# Patient Record
Sex: Female | Born: 1945 | Race: White | Hispanic: No | Marital: Married | Smoking: Never smoker
Health system: Southern US, Community
[De-identification: ages and names within clinical notes are randomized; demographics above are authoritative.]

## PROBLEM LIST (undated history)

## (undated) DIAGNOSIS — K5790 Diverticulosis of intestine, part unspecified, without perforation or abscess without bleeding: Secondary | ICD-10-CM

## (undated) DIAGNOSIS — G629 Polyneuropathy, unspecified: Secondary | ICD-10-CM

## (undated) DIAGNOSIS — I639 Cerebral infarction, unspecified: Secondary | ICD-10-CM

## (undated) DIAGNOSIS — O24419 Gestational diabetes mellitus in pregnancy, unspecified control: Secondary | ICD-10-CM

## (undated) DIAGNOSIS — J189 Pneumonia, unspecified organism: Secondary | ICD-10-CM

## (undated) DIAGNOSIS — N952 Postmenopausal atrophic vaginitis: Secondary | ICD-10-CM

## (undated) DIAGNOSIS — R51 Headache: Secondary | ICD-10-CM

## (undated) DIAGNOSIS — F329 Major depressive disorder, single episode, unspecified: Secondary | ICD-10-CM

## (undated) DIAGNOSIS — F32A Depression, unspecified: Secondary | ICD-10-CM

## (undated) DIAGNOSIS — M810 Age-related osteoporosis without current pathological fracture: Secondary | ICD-10-CM

## (undated) DIAGNOSIS — R42 Dizziness and giddiness: Secondary | ICD-10-CM

## (undated) DIAGNOSIS — Z8719 Personal history of other diseases of the digestive system: Secondary | ICD-10-CM

## (undated) DIAGNOSIS — I6529 Occlusion and stenosis of unspecified carotid artery: Secondary | ICD-10-CM

## (undated) DIAGNOSIS — Z8619 Personal history of other infectious and parasitic diseases: Secondary | ICD-10-CM

## (undated) DIAGNOSIS — R112 Nausea with vomiting, unspecified: Secondary | ICD-10-CM

## (undated) DIAGNOSIS — M2550 Pain in unspecified joint: Secondary | ICD-10-CM

## (undated) DIAGNOSIS — D649 Anemia, unspecified: Secondary | ICD-10-CM

## (undated) DIAGNOSIS — I499 Cardiac arrhythmia, unspecified: Secondary | ICD-10-CM

## (undated) DIAGNOSIS — Z9889 Other specified postprocedural states: Secondary | ICD-10-CM

## (undated) DIAGNOSIS — M1990 Unspecified osteoarthritis, unspecified site: Secondary | ICD-10-CM

## (undated) DIAGNOSIS — G2581 Restless legs syndrome: Secondary | ICD-10-CM

## (undated) DIAGNOSIS — M549 Dorsalgia, unspecified: Secondary | ICD-10-CM

## (undated) DIAGNOSIS — K909 Intestinal malabsorption, unspecified: Principal | ICD-10-CM

## (undated) DIAGNOSIS — R252 Cramp and spasm: Secondary | ICD-10-CM

## (undated) DIAGNOSIS — Z8709 Personal history of other diseases of the respiratory system: Secondary | ICD-10-CM

## (undated) DIAGNOSIS — E559 Vitamin D deficiency, unspecified: Secondary | ICD-10-CM

## (undated) DIAGNOSIS — J45909 Unspecified asthma, uncomplicated: Secondary | ICD-10-CM

## (undated) DIAGNOSIS — E785 Hyperlipidemia, unspecified: Secondary | ICD-10-CM

## (undated) DIAGNOSIS — K219 Gastro-esophageal reflux disease without esophagitis: Secondary | ICD-10-CM

## (undated) DIAGNOSIS — G4700 Insomnia, unspecified: Secondary | ICD-10-CM

## (undated) DIAGNOSIS — Z8379 Family history of other diseases of the digestive system: Secondary | ICD-10-CM

## (undated) DIAGNOSIS — M2540 Effusion, unspecified joint: Secondary | ICD-10-CM

## (undated) DIAGNOSIS — R351 Nocturia: Secondary | ICD-10-CM

## (undated) DIAGNOSIS — D126 Benign neoplasm of colon, unspecified: Secondary | ICD-10-CM

## (undated) DIAGNOSIS — G4730 Sleep apnea, unspecified: Secondary | ICD-10-CM

## (undated) HISTORY — PX: JOINT REPLACEMENT: SHX530

## (undated) HISTORY — PX: WISDOM TOOTH EXTRACTION: SHX21

## (undated) HISTORY — DX: Hyperlipidemia, unspecified: E78.5

## (undated) HISTORY — DX: Benign neoplasm of colon, unspecified: D12.6

## (undated) HISTORY — DX: Occlusion and stenosis of unspecified carotid artery: I65.29

## (undated) HISTORY — DX: Dizziness and giddiness: R42

## (undated) HISTORY — DX: Restless legs syndrome: G25.81

## (undated) HISTORY — DX: Gestational diabetes mellitus in pregnancy, unspecified control: O24.419

## (undated) HISTORY — DX: Age-related osteoporosis without current pathological fracture: M81.0

## (undated) HISTORY — PX: HERNIA REPAIR: SHX51

## (undated) HISTORY — PX: STEROID INJECTION TO SCAR: SHX2447

## (undated) HISTORY — DX: Gastro-esophageal reflux disease without esophagitis: K21.9

## (undated) HISTORY — PX: COLONOSCOPY: SHX174

## (undated) HISTORY — DX: Vitamin D deficiency, unspecified: E55.9

## (undated) HISTORY — PX: ESOPHAGOGASTRODUODENOSCOPY: SHX1529

## (undated) HISTORY — DX: Cramp and spasm: R25.2

## (undated) HISTORY — DX: Unspecified osteoarthritis, unspecified site: M19.90

## (undated) HISTORY — DX: Postmenopausal atrophic vaginitis: N95.2

## (undated) HISTORY — DX: Intestinal malabsorption, unspecified: K90.9

## (undated) HISTORY — DX: Diverticulosis of intestine, part unspecified, without perforation or abscess without bleeding: K57.90

## (undated) HISTORY — PX: OTHER SURGICAL HISTORY: SHX169

---

## 1998-11-15 ENCOUNTER — Other Ambulatory Visit: Admission: RE | Admit: 1998-11-15 | Discharge: 1998-11-15 | Payer: Self-pay | Admitting: Obstetrics and Gynecology

## 1998-11-22 ENCOUNTER — Other Ambulatory Visit: Admission: RE | Admit: 1998-11-22 | Discharge: 1998-11-22 | Payer: Self-pay | Admitting: Obstetrics and Gynecology

## 1999-07-04 ENCOUNTER — Other Ambulatory Visit: Admission: RE | Admit: 1999-07-04 | Discharge: 1999-07-04 | Payer: Self-pay | Admitting: Obstetrics and Gynecology

## 1999-11-01 ENCOUNTER — Encounter: Payer: Self-pay | Admitting: Obstetrics and Gynecology

## 1999-11-01 ENCOUNTER — Encounter: Admission: RE | Admit: 1999-11-01 | Discharge: 1999-11-01 | Payer: Self-pay | Admitting: Obstetrics and Gynecology

## 1999-12-16 HISTORY — PX: GASTRIC BYPASS: SHX52

## 1999-12-19 ENCOUNTER — Other Ambulatory Visit: Admission: RE | Admit: 1999-12-19 | Discharge: 1999-12-19 | Payer: Self-pay | Admitting: Obstetrics and Gynecology

## 2000-01-31 ENCOUNTER — Ambulatory Visit (HOSPITAL_COMMUNITY): Admission: RE | Admit: 2000-01-31 | Discharge: 2000-01-31 | Payer: Self-pay | Admitting: Gastroenterology

## 2000-08-07 ENCOUNTER — Encounter (INDEPENDENT_AMBULATORY_CARE_PROVIDER_SITE_OTHER): Payer: Self-pay

## 2000-08-07 ENCOUNTER — Other Ambulatory Visit: Admission: RE | Admit: 2000-08-07 | Discharge: 2000-08-07 | Payer: Self-pay | Admitting: Obstetrics and Gynecology

## 2000-11-19 ENCOUNTER — Encounter: Admission: RE | Admit: 2000-11-19 | Discharge: 2000-11-19 | Payer: Self-pay | Admitting: Internal Medicine

## 2000-11-19 ENCOUNTER — Encounter: Payer: Self-pay | Admitting: Internal Medicine

## 2001-02-02 ENCOUNTER — Encounter: Payer: Self-pay | Admitting: Emergency Medicine

## 2001-02-02 ENCOUNTER — Emergency Department (HOSPITAL_COMMUNITY): Admission: EM | Admit: 2001-02-02 | Discharge: 2001-02-02 | Payer: Self-pay | Admitting: Emergency Medicine

## 2001-12-09 ENCOUNTER — Encounter: Payer: Self-pay | Admitting: Obstetrics and Gynecology

## 2001-12-09 ENCOUNTER — Encounter: Admission: RE | Admit: 2001-12-09 | Discharge: 2001-12-09 | Payer: Self-pay | Admitting: Obstetrics and Gynecology

## 2001-12-17 ENCOUNTER — Other Ambulatory Visit: Admission: RE | Admit: 2001-12-17 | Discharge: 2001-12-17 | Payer: Self-pay | Admitting: Obstetrics and Gynecology

## 2002-01-26 ENCOUNTER — Encounter: Admission: RE | Admit: 2002-01-26 | Discharge: 2002-01-26 | Payer: Self-pay | Admitting: Obstetrics and Gynecology

## 2002-01-26 ENCOUNTER — Encounter: Payer: Self-pay | Admitting: Obstetrics and Gynecology

## 2002-01-27 ENCOUNTER — Encounter: Payer: Self-pay | Admitting: Orthopedic Surgery

## 2002-01-27 ENCOUNTER — Encounter: Admission: RE | Admit: 2002-01-27 | Discharge: 2002-01-27 | Payer: Self-pay | Admitting: Orthopedic Surgery

## 2002-05-18 ENCOUNTER — Encounter: Payer: Self-pay | Admitting: Orthopedic Surgery

## 2002-05-20 ENCOUNTER — Observation Stay (HOSPITAL_COMMUNITY): Admission: RE | Admit: 2002-05-20 | Discharge: 2002-05-21 | Payer: Self-pay | Admitting: Orthopedic Surgery

## 2002-07-14 ENCOUNTER — Encounter: Payer: Self-pay | Admitting: Surgery

## 2002-07-14 ENCOUNTER — Observation Stay (HOSPITAL_COMMUNITY): Admission: EM | Admit: 2002-07-14 | Discharge: 2002-07-16 | Payer: Self-pay | Admitting: Emergency Medicine

## 2002-08-11 ENCOUNTER — Ambulatory Visit (HOSPITAL_COMMUNITY): Admission: RE | Admit: 2002-08-11 | Discharge: 2002-08-12 | Payer: Self-pay | Admitting: Orthopaedic Surgery

## 2002-09-20 ENCOUNTER — Encounter: Admission: RE | Admit: 2002-09-20 | Discharge: 2002-09-20 | Payer: Self-pay | Admitting: Orthopaedic Surgery

## 2002-09-20 ENCOUNTER — Encounter: Payer: Self-pay | Admitting: Orthopaedic Surgery

## 2002-12-13 ENCOUNTER — Ambulatory Visit (HOSPITAL_COMMUNITY): Admission: RE | Admit: 2002-12-13 | Discharge: 2002-12-13 | Payer: Self-pay | Admitting: *Deleted

## 2003-02-08 ENCOUNTER — Other Ambulatory Visit: Admission: RE | Admit: 2003-02-08 | Discharge: 2003-02-08 | Payer: Self-pay | Admitting: Obstetrics and Gynecology

## 2003-02-13 ENCOUNTER — Encounter: Payer: Self-pay | Admitting: Obstetrics and Gynecology

## 2003-02-13 ENCOUNTER — Encounter: Admission: RE | Admit: 2003-02-13 | Discharge: 2003-02-13 | Payer: Self-pay | Admitting: Obstetrics and Gynecology

## 2004-02-08 ENCOUNTER — Encounter: Admission: RE | Admit: 2004-02-08 | Discharge: 2004-02-08 | Payer: Self-pay | Admitting: Obstetrics and Gynecology

## 2004-02-15 ENCOUNTER — Other Ambulatory Visit: Admission: RE | Admit: 2004-02-15 | Discharge: 2004-02-15 | Payer: Self-pay | Admitting: Obstetrics and Gynecology

## 2004-02-22 ENCOUNTER — Encounter: Admission: RE | Admit: 2004-02-22 | Discharge: 2004-02-22 | Payer: Self-pay | Admitting: Obstetrics and Gynecology

## 2004-10-24 ENCOUNTER — Encounter: Admission: RE | Admit: 2004-10-24 | Discharge: 2004-10-24 | Payer: Self-pay | Admitting: Obstetrics and Gynecology

## 2004-10-31 ENCOUNTER — Encounter: Admission: RE | Admit: 2004-10-31 | Discharge: 2004-10-31 | Payer: Self-pay | Admitting: General Surgery

## 2004-10-31 IMAGING — CT CT CHEST W/ CM
1 series · 16 of 33 positions shown, 20 images · IV contrast (75ML OMNI 300)
Comparison: none

CLINICAL DATA: Left chest wall mass palpable on exam.
 CHEST CT WITH CONTRAST ? [DATE]:

[Series 2: chest w/ · axial · 0.70mm/px · z∈[-331,-11]mm · 16 of 70 slices shown, 20 images]
[im 3/70  mediastinal]
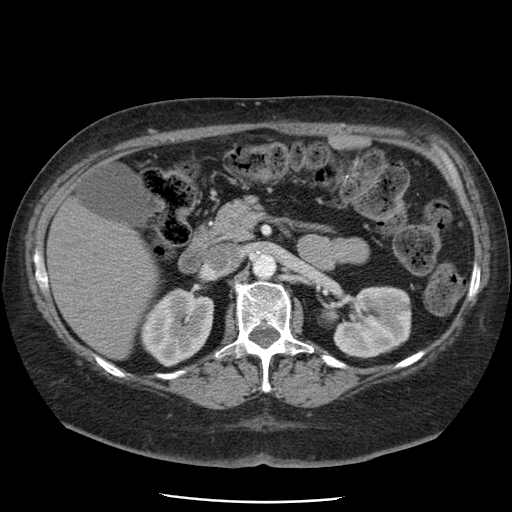
[im 3/70  lung]
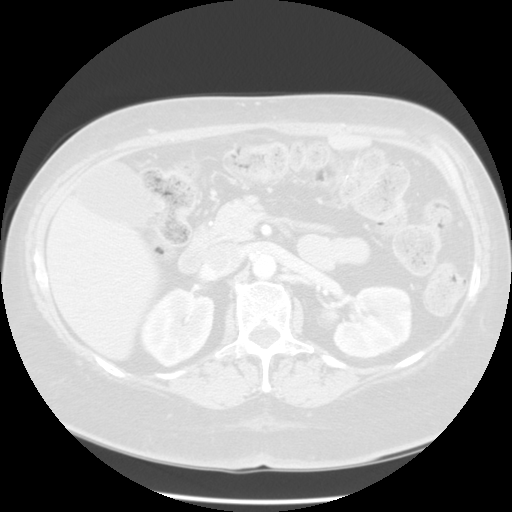
[im 8/70  lung]
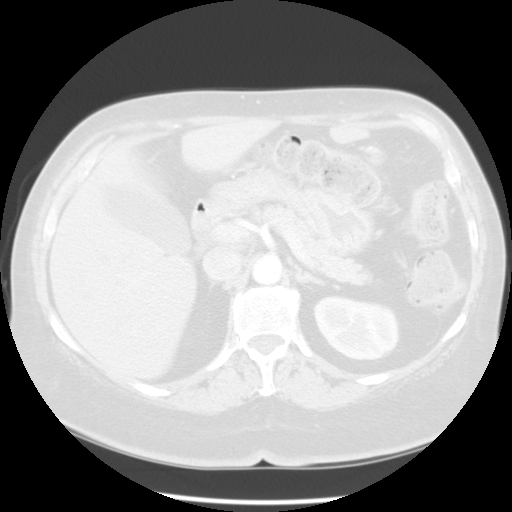
[im 13/70  lung]
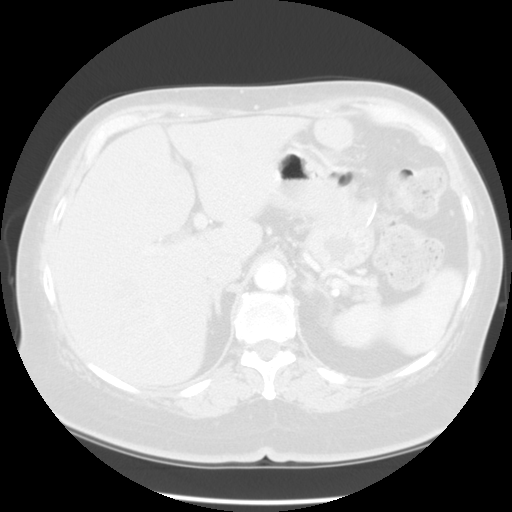
[im 16/70  lung]
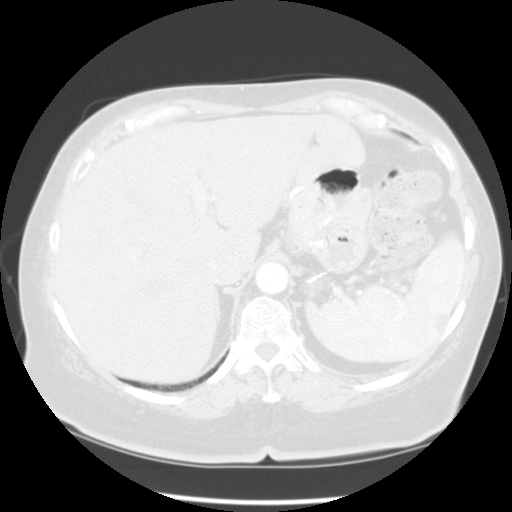
[im 21/70  mediastinal]
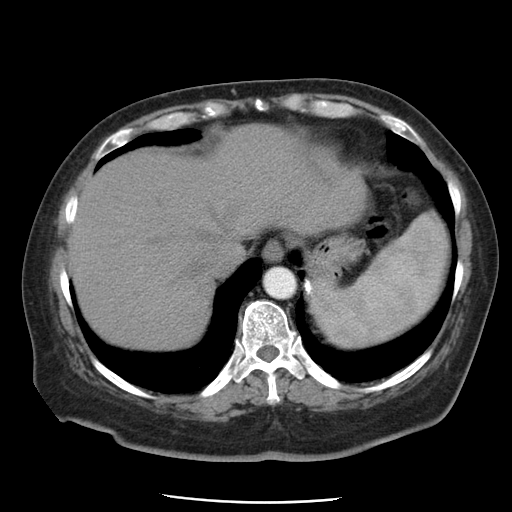
[im 21/70  lung]
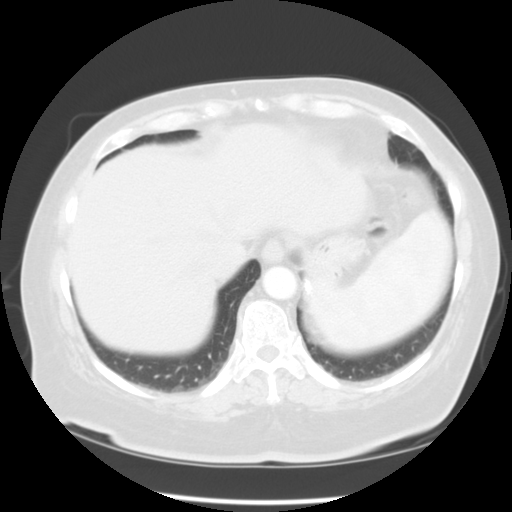
[im 26/70  lung]
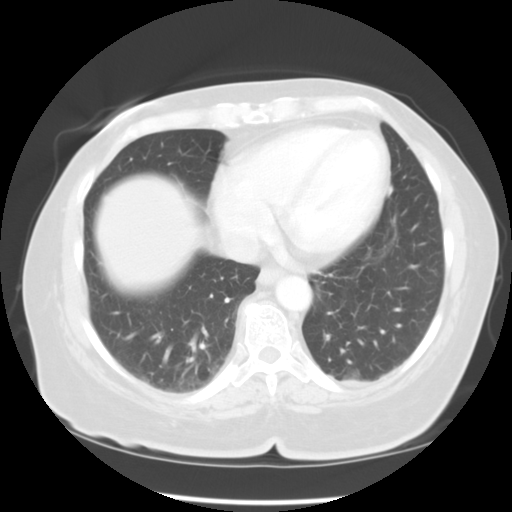
[im 29/70  lung]
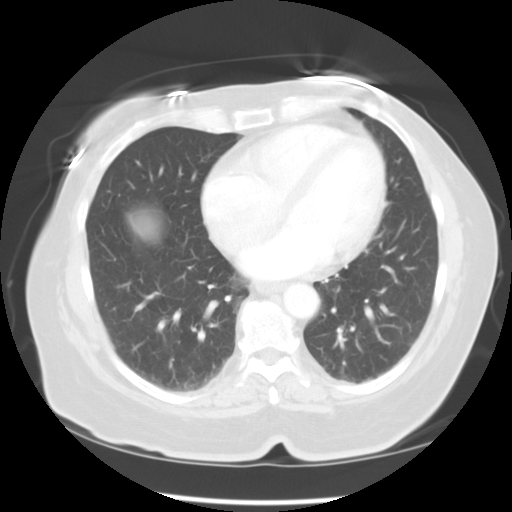
[im 34/70  lung]
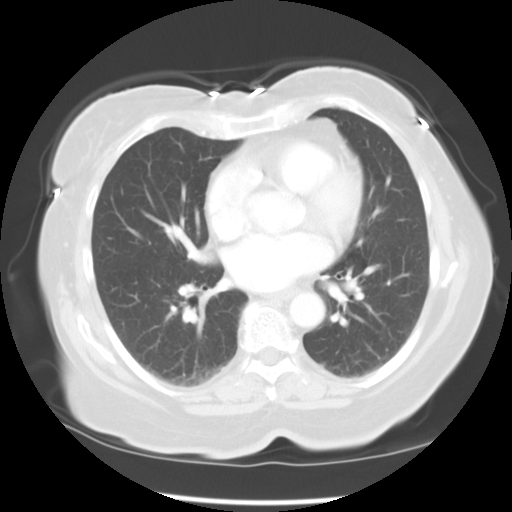
[im 37/70  mediastinal]
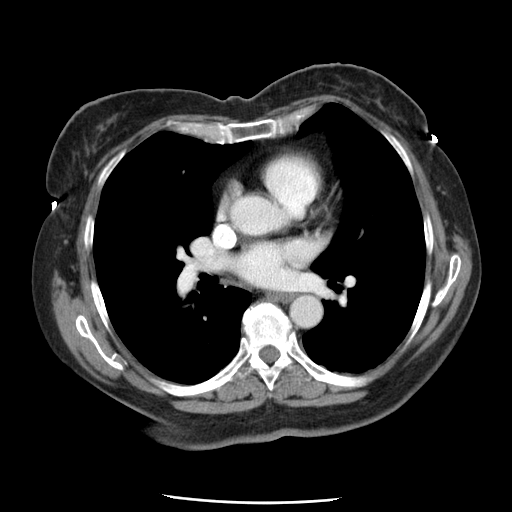
[im 37/70  lung]
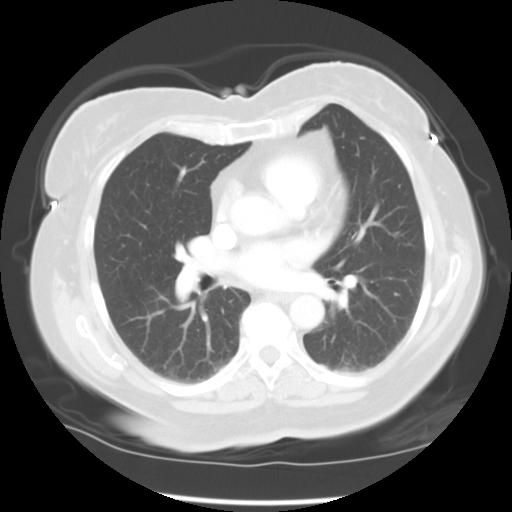
[im 41/70  lung]
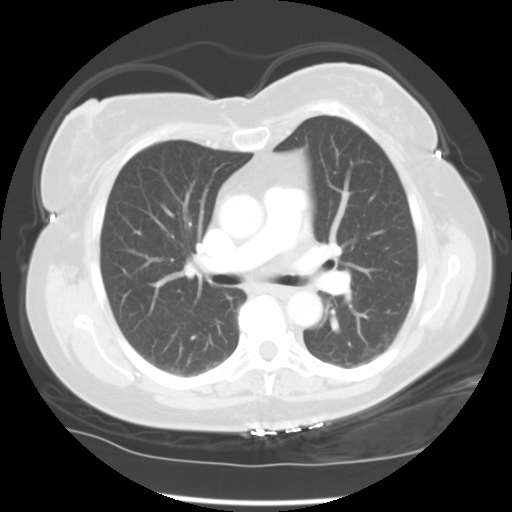
[im 44/70  lung]
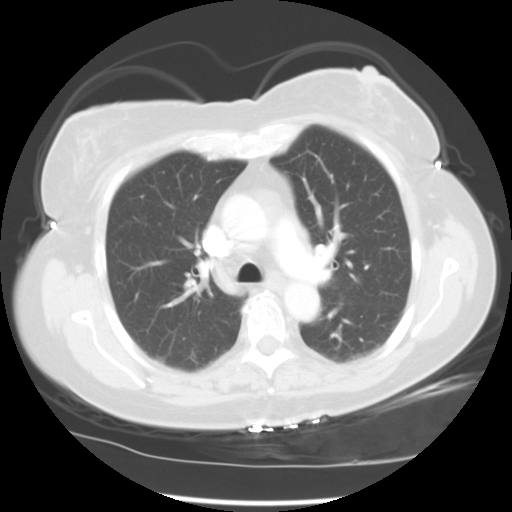
[im 49/70  lung]
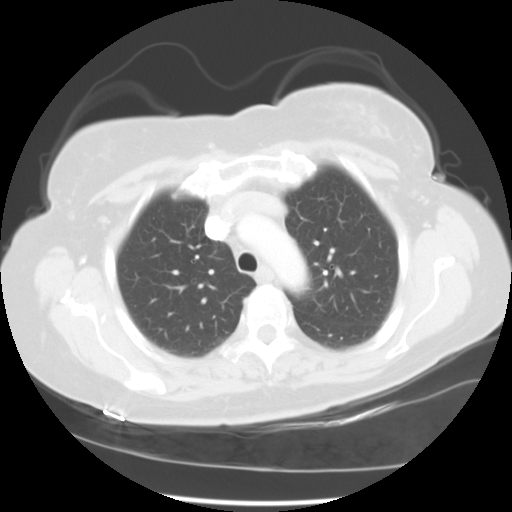
[im 54/70  mediastinal]
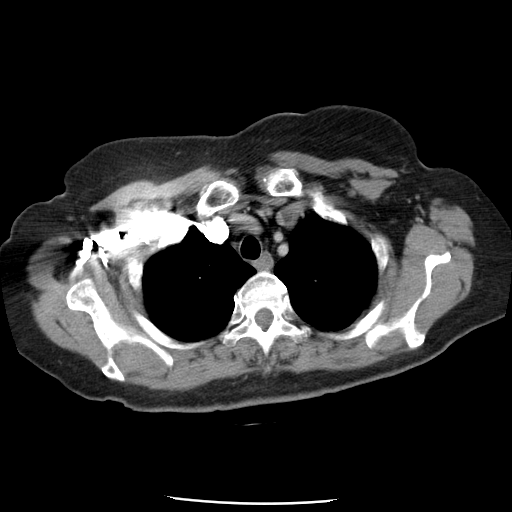
[im 54/70  lung]
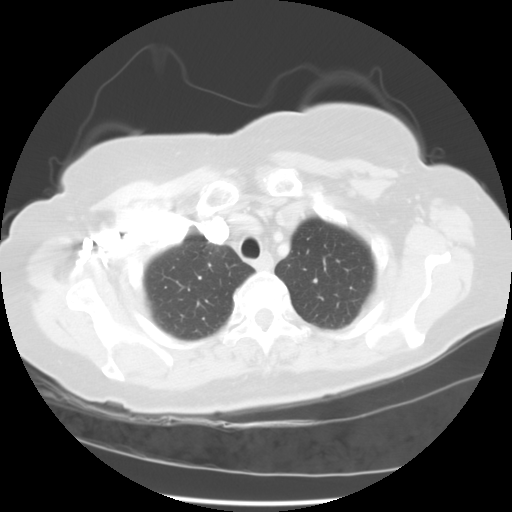
[im 57/70  lung]
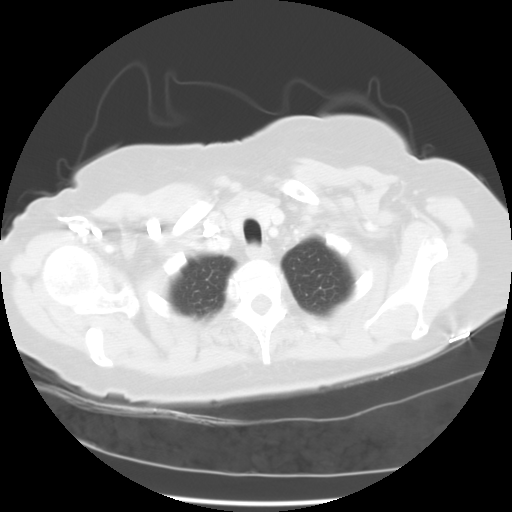
[im 62/70  lung]
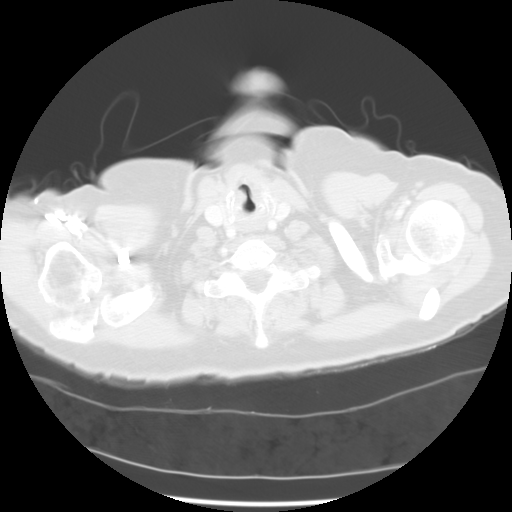
[im 67/70  lung]
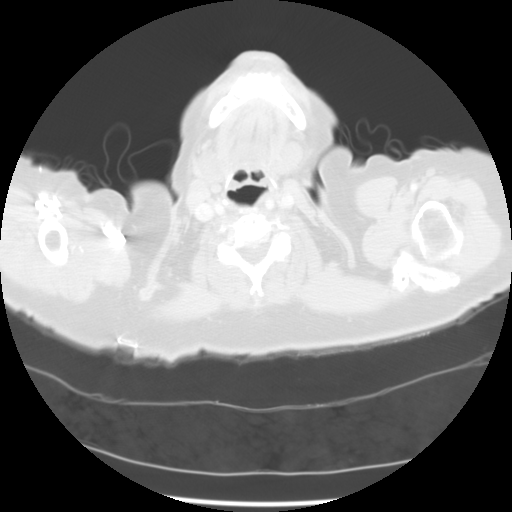

[16 of 33 positions shown; findings below may reference images not displayed]

FINDINGS: Multidetector spiral axial images were obtained through the thorax with IV injection of 75 cc Omnipaque 300.  Comparison is made with digitized [REDACTED] PA chest x-ray of [DATE].
 No discrete CT abnormality is seen in the region of the left anterior chest wall superiorly between clavicle and superior breasts.  Lungs are clear.  Heart size is upper limits of normal.  No mediastinum, hilar, nor axillary mass/adenopathy is seen.  No significant osseous abnormality is noted with slight degenerative change in the dorsal spine.  1.9 cm right renal and 1.4 cm lateral splenic cysts are seen with gastric surgical clips noted.  Upper abdominal organs are otherwise unremarkable.
IMPRESSION: 1.  No CT abnormality in region questioned on anterior superior chest wall palpable mass.
 2.  Post gastric surgery with incidental splenic and right renal cysts.
 3.  Otherwise no significant abnormality.

## 2005-02-20 ENCOUNTER — Other Ambulatory Visit: Admission: RE | Admit: 2005-02-20 | Discharge: 2005-02-20 | Payer: Self-pay | Admitting: Obstetrics and Gynecology

## 2005-03-11 ENCOUNTER — Encounter: Admission: RE | Admit: 2005-03-11 | Discharge: 2005-03-11 | Payer: Self-pay | Admitting: Obstetrics and Gynecology

## 2005-09-15 ENCOUNTER — Encounter (HOSPITAL_COMMUNITY): Admission: RE | Admit: 2005-09-15 | Discharge: 2005-12-12 | Payer: Self-pay | Admitting: Neurology

## 2006-02-13 ENCOUNTER — Encounter (HOSPITAL_COMMUNITY): Admission: RE | Admit: 2006-02-13 | Discharge: 2006-03-26 | Payer: Self-pay | Admitting: Neurology

## 2006-03-02 ENCOUNTER — Other Ambulatory Visit: Admission: RE | Admit: 2006-03-02 | Discharge: 2006-03-02 | Payer: Self-pay | Admitting: Obstetrics and Gynecology

## 2006-07-23 ENCOUNTER — Encounter (HOSPITAL_COMMUNITY): Admission: RE | Admit: 2006-07-23 | Discharge: 2006-10-21 | Payer: Self-pay | Admitting: Neurology

## 2006-10-23 ENCOUNTER — Encounter (HOSPITAL_COMMUNITY): Admission: RE | Admit: 2006-10-23 | Discharge: 2006-12-10 | Payer: Self-pay | Admitting: Neurology

## 2007-04-09 ENCOUNTER — Encounter (HOSPITAL_COMMUNITY): Admission: RE | Admit: 2007-04-09 | Discharge: 2007-05-14 | Payer: Self-pay | Admitting: Neurology

## 2007-07-08 ENCOUNTER — Encounter (HOSPITAL_COMMUNITY): Admission: RE | Admit: 2007-07-08 | Discharge: 2007-09-13 | Payer: Self-pay | Admitting: Neurology

## 2007-07-16 ENCOUNTER — Encounter: Admission: RE | Admit: 2007-07-16 | Discharge: 2007-07-16 | Payer: Self-pay | Admitting: Surgical Oncology

## 2007-07-16 IMAGING — CT CT ABDOMEN W/ CM
2 of 8 series · 14 of 46 positions shown, 19 images · IV contrast (READICAT/WATER & [ID] OMNI 300)
Comparison: None.

CLINICAL DATA: Right periumbilical discomfort, evaluate for hernia.
ABDOMEN CT WITH CONTRAST:
TECHNIQUE: Multidetector CT imaging of the abdomen was performed following the standard protocol during bolus administration of intravenous contrast.
Contrast:  100 cc of Omnipaque 300.
TECHNIQUE: Multidetector CT imaging of the pelvis was performed following the standard protocol during bolus administration of intravenous contrast.
The mesh appears intact in the mid abdominal line.  Uterus is normal in size.  Urinary bladder is unremarkable.  No adnexal lesion is seen.  Scattered colonic diverticula are present.  There is anterolisthesis of L5 on S1 with degenerative disk disease noted.  There appear to be pars defects at L5 bilaterally.

[Series 2: a&p w/ · axial · 0.70mm/px · z∈[-411,-36]mm · 11 of 86 slices shown, 16 images]
[im 7/86  soft-tissue]
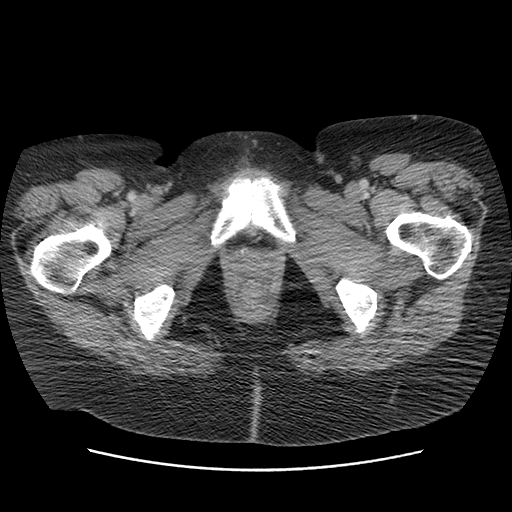
[im 7/86  bone]
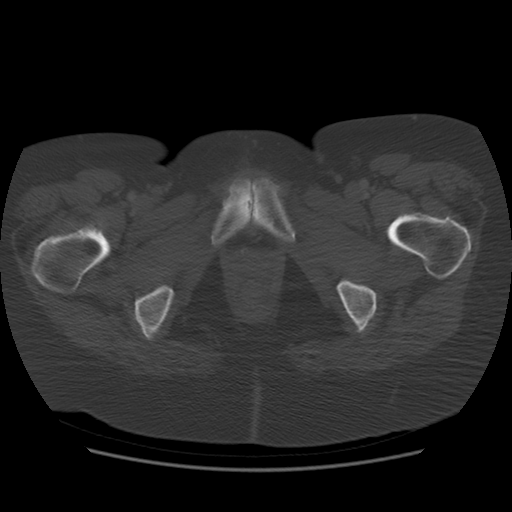
[im 14/86  soft-tissue]
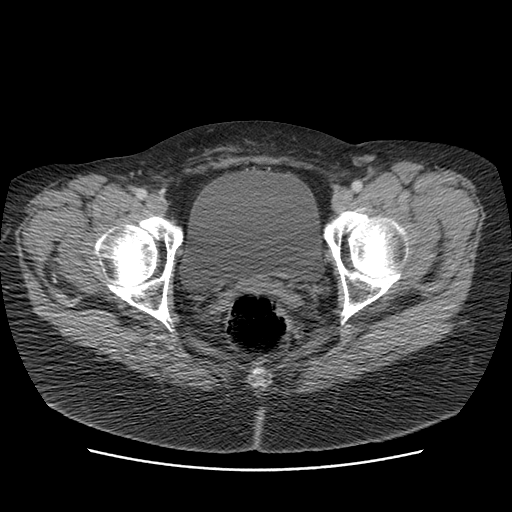
[im 27/86  soft-tissue]
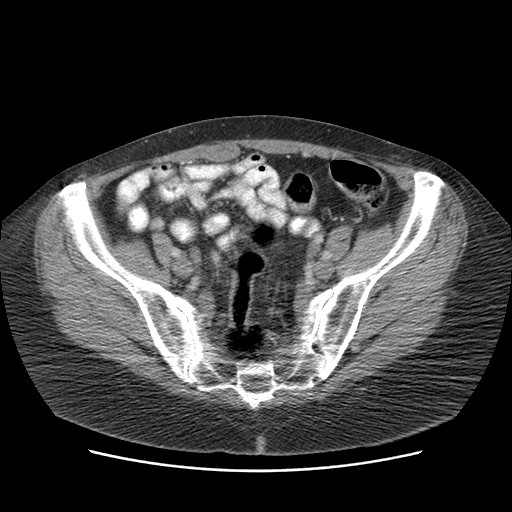
[im 33/86  soft-tissue]
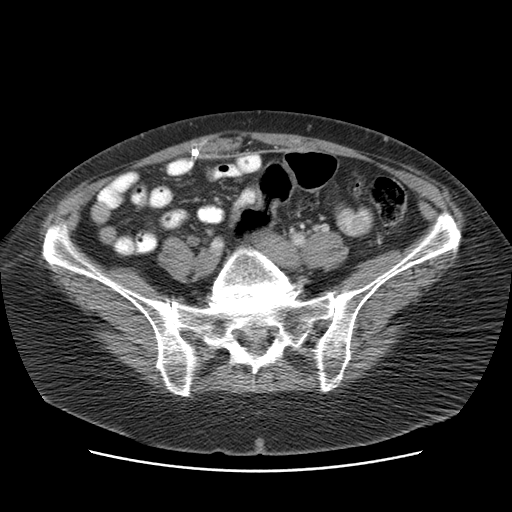
[im 40/86  soft-tissue]
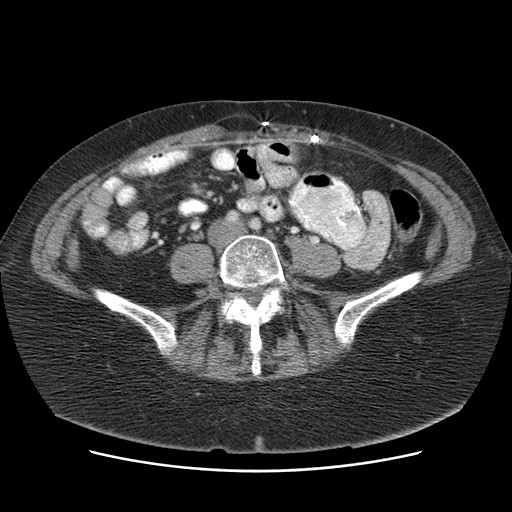
[im 46/86  soft-tissue]
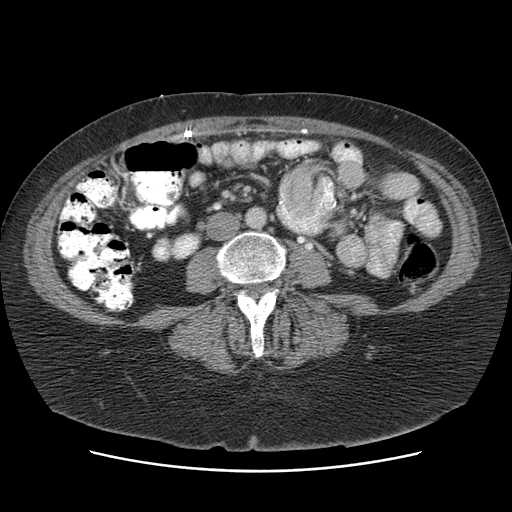
[im 53/86  soft-tissue]
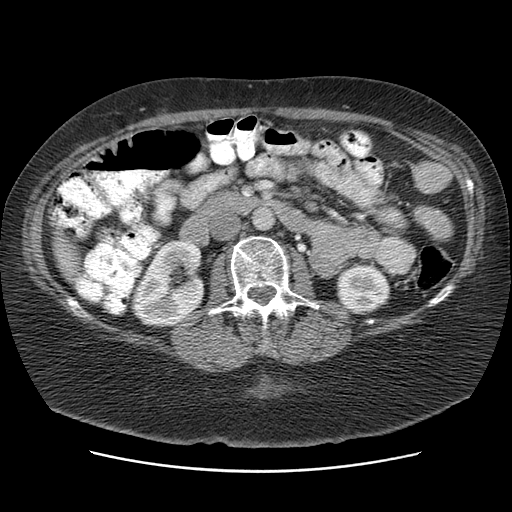
[im 59/86  lung]
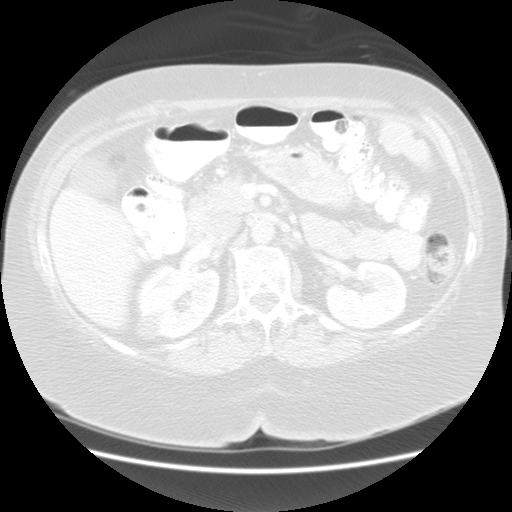
[im 66/86  soft-tissue]
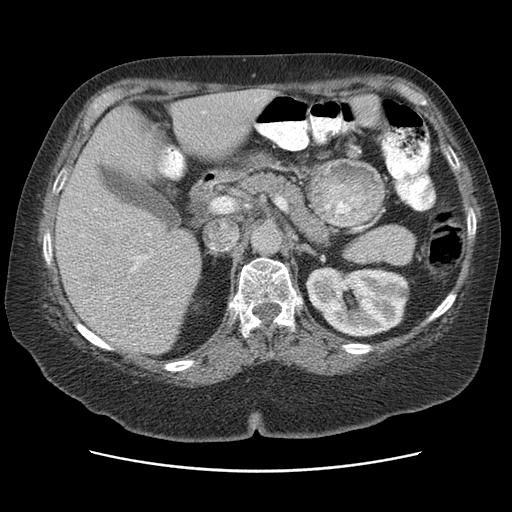
[im 66/86  lung]
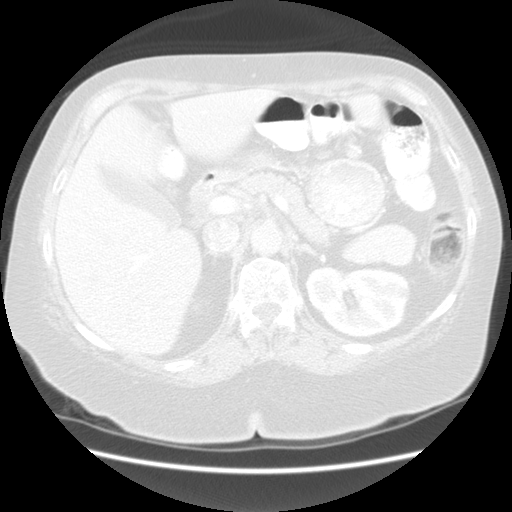
[im 72/86  soft-tissue]
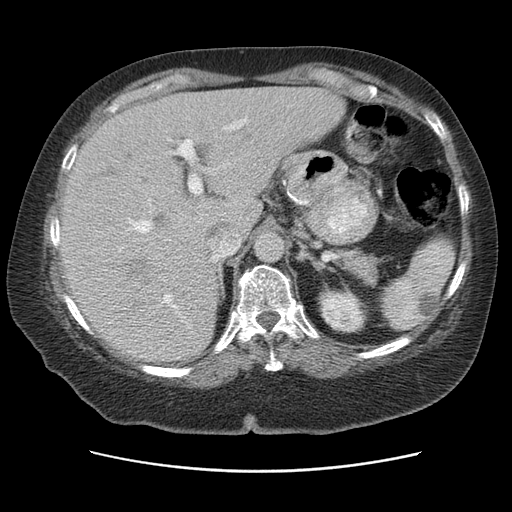
[im 72/86  lung]
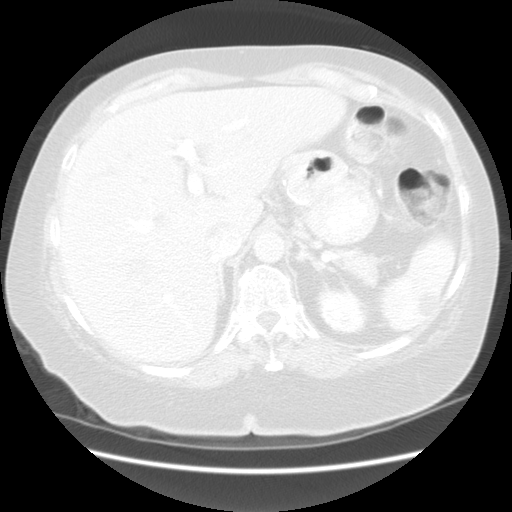
[im 72/86  bone]
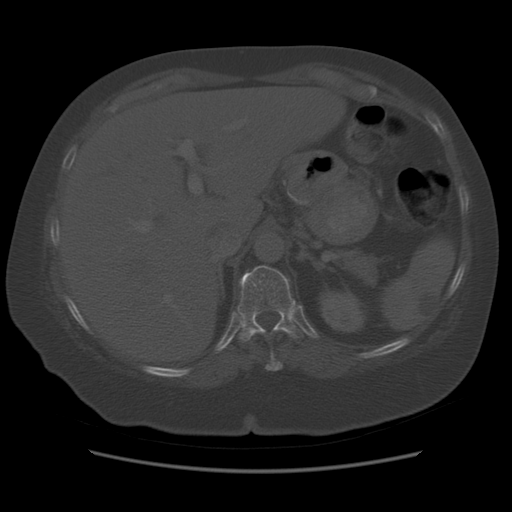
[im 79/86  soft-tissue]
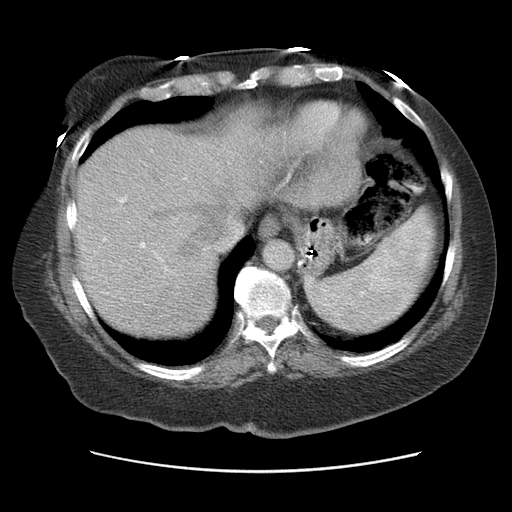
[im 79/86  lung]
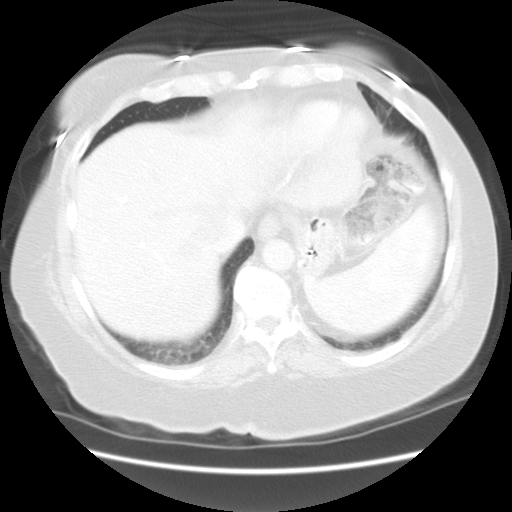

[Series 400: coronal · coronal · 0.88mm/px · 3 of 117 slices shown]
[im 30/117  soft-tissue]
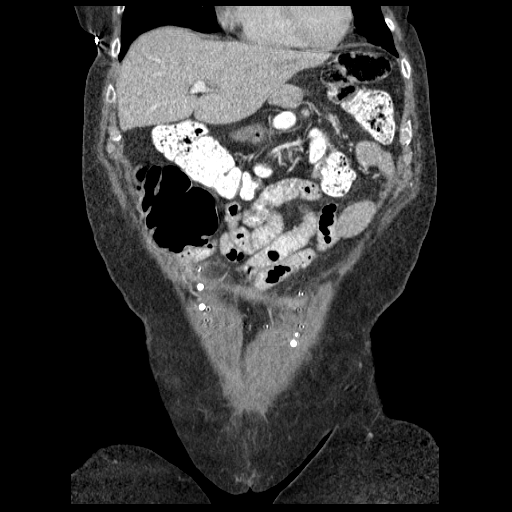
[im 59/117  soft-tissue]
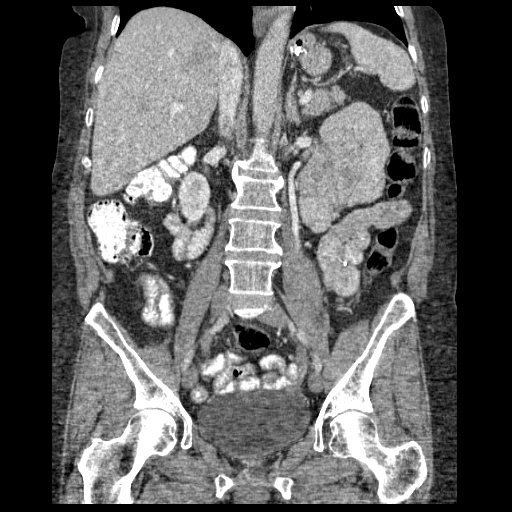
[im 88/117  soft-tissue]
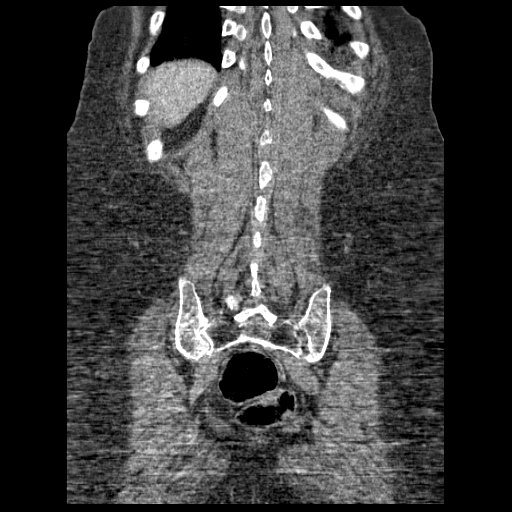

[14 of 46 positions shown; findings below may reference images not displayed]

The lung bases are clear.  The liver enhances with no focal abnormality, and no ductal dilatation is seen.  No calcified gallstones are noted.  The pancreas is normal in size and the pancreatic duct is not dilated.  The adrenal glands appear normal.  There is a single rounded low attenuation splenic lesion which persists on delayed images and probably represents a complex cyst.  The kidneys enhance and small bilateral renal cysts are present.  Pelvocaliceal systems appear normal.   The abdominal aorta is normal in caliber. No adenopathy is seen.  Mesh is noted from prior hernia repair.  Repeat imaging was obtained with Valsalva maneuver.  No hernia is seen.  A small bulge of fat is noted at the umbilicus but no hernia is present.
IMPRESSION: No definite hernia.   bulging of fat is noted at the umbilicus.
PELVIS CT WITH CONTRAST:
IMPRESSION: 1.  No acute abnormality on CT of the pelvis.  
2.  No hernia.
3.  Anterolisthesis of L5 on S1 with pars defects and degenerative disk disease.

## 2007-07-19 ENCOUNTER — Encounter: Admission: RE | Admit: 2007-07-19 | Discharge: 2007-07-19 | Payer: Self-pay | Admitting: Obstetrics and Gynecology

## 2007-09-30 ENCOUNTER — Other Ambulatory Visit: Admission: RE | Admit: 2007-09-30 | Discharge: 2007-09-30 | Payer: Self-pay | Admitting: Obstetrics and Gynecology

## 2007-10-04 ENCOUNTER — Encounter (HOSPITAL_COMMUNITY): Admission: RE | Admit: 2007-10-04 | Discharge: 2007-12-15 | Payer: Self-pay | Admitting: Neurology

## 2007-12-16 ENCOUNTER — Encounter (HOSPITAL_COMMUNITY): Admission: RE | Admit: 2007-12-16 | Discharge: 2007-12-28 | Payer: Self-pay | Admitting: Neurology

## 2008-10-11 ENCOUNTER — Other Ambulatory Visit: Admission: RE | Admit: 2008-10-11 | Discharge: 2008-10-11 | Payer: Self-pay | Admitting: Obstetrics and Gynecology

## 2008-10-11 ENCOUNTER — Encounter: Admission: RE | Admit: 2008-10-11 | Discharge: 2008-10-11 | Payer: Self-pay | Admitting: Obstetrics and Gynecology

## 2009-03-26 ENCOUNTER — Ambulatory Visit (HOSPITAL_COMMUNITY): Admission: RE | Admit: 2009-03-26 | Discharge: 2009-03-26 | Payer: Self-pay | Admitting: Neurology

## 2009-04-17 ENCOUNTER — Inpatient Hospital Stay (HOSPITAL_BASED_OUTPATIENT_CLINIC_OR_DEPARTMENT_OTHER): Admission: RE | Admit: 2009-04-17 | Discharge: 2009-04-17 | Payer: Self-pay | Admitting: Interventional Cardiology

## 2009-05-03 ENCOUNTER — Ambulatory Visit (HOSPITAL_COMMUNITY): Admission: RE | Admit: 2009-05-03 | Discharge: 2009-05-03 | Payer: Self-pay | Admitting: Interventional Cardiology

## 2009-05-03 IMAGING — CT CT HEART MORP W/ CTA COR W/ SCORE W/ CA W/CM &/OR W/O CM
2 series · 16 of 20 positions shown, 18 images · IV contrast (omnipaque)
Comparison: Chest CT [DATE].

Addendum Begins

CORONARY CTA WITHOUT CALCIUM SCORE [DATE] [DATE]
Ordering Physician: DEDRICK
DEDRICK Physician: [REDACTED] [email or web address]
Contrast: Omnipaque 350; 100 mL
Indications: Anomalous right coronary origin
Procedure: Patient was pre-medicated with 25 mg of metoprolol heart
rate on the evening prior and morning of the procedure.  Heart rate
during scanning 60 beats per minute.
Prospective scanning was performed to minimize radiation exposure
in this patient who has had previous coronary angiography.  A total
of 80 ml of Omnipaque 350 was used during the scanning sequence.
A 20 ml timing bolus was used with the region of interest placed in
the ascending aorta above the coronary ostia.  Calcium scoring was
not performed.
DETAILED FINDINGS:
Quality of Study: Excellent
Left Main: Normal
Left Anterior Descending: Normal
Left Circumflex: Normal
Right Coronary Artery: Anomalous origin with the ostium arising
from the left sinus of Valsalva.  There is a slit-like origin from
the ostium. The course is between the aorta and pulmonary artery.
Ventricular Function/Wall Motion: Not evaluated
LV Ejection Fraction: Not applicable
Left Atrium, Right Atrium, RV Size: Not evaluated
Pericardium: Not evaluated
Coronary Calcium Score: Not evaluated
Aorta: Not evaluated
Other: Not evaluated

[Series 4: coronary cta 3.0 b25f 67% · axial · 0.38mm/px · z∈[-231,-135]mm · 8 of 42 slices shown]
[im 5/42  vessel]
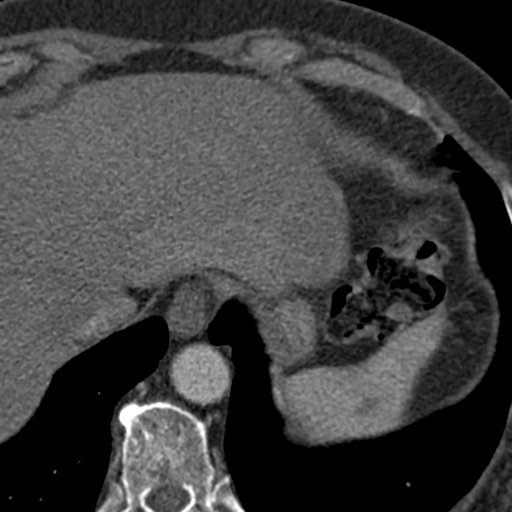
[im 10/42  vessel]
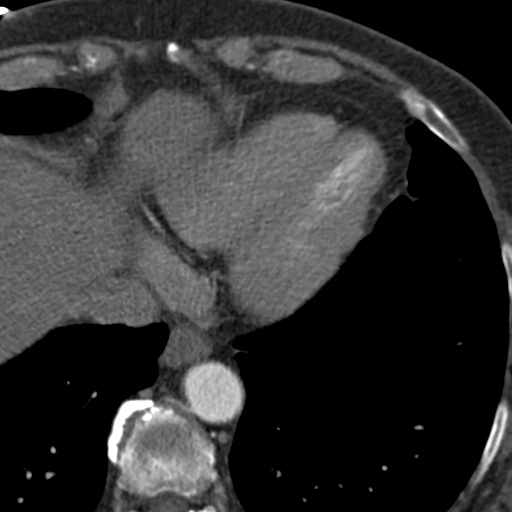
[im 14/42  vessel]
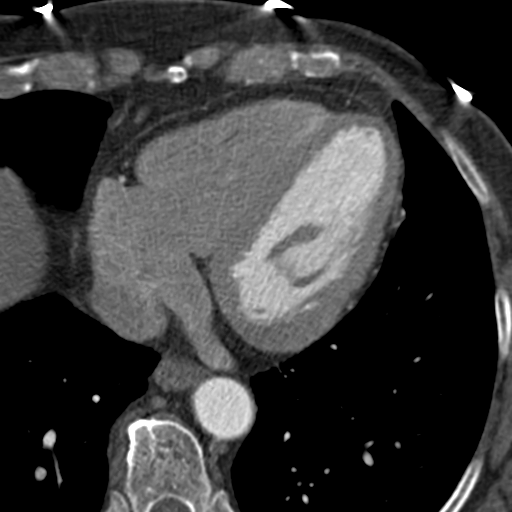
[im 19/42  vessel]
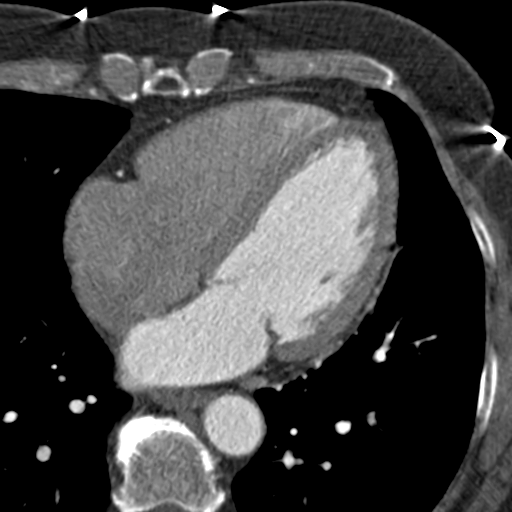
[im 23/42  vessel]
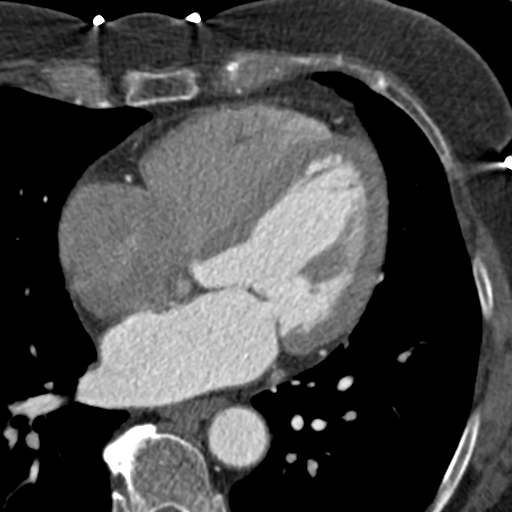
[im 28/42  vessel]
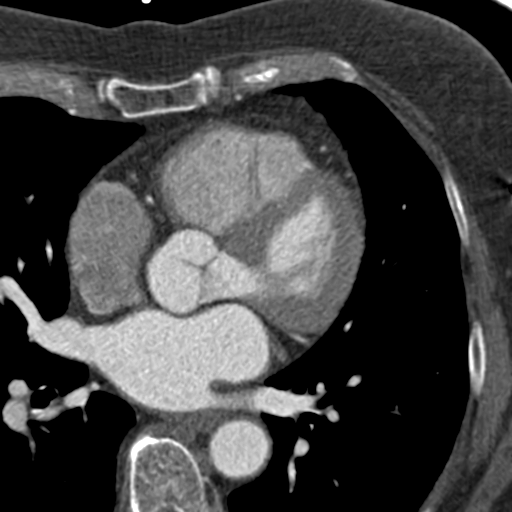
[im 32/42  vessel]
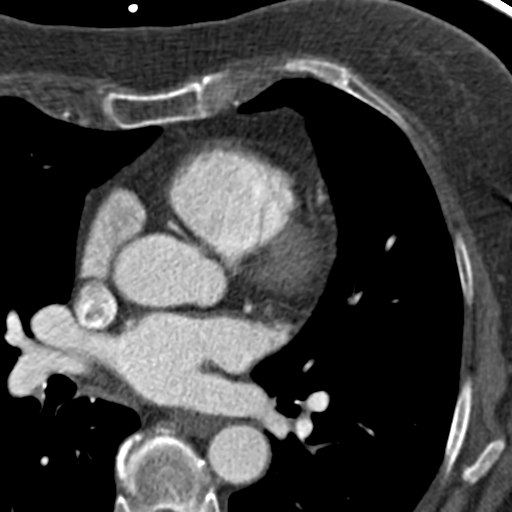
[im 37/42  vessel]
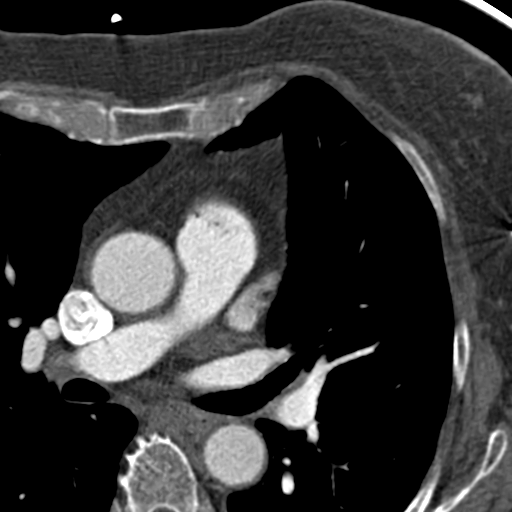

[Series 6: soft tissue · axial · 0.63mm/px · z∈[-231,-135]mm · 8 of 42 slices shown, 10 images]
[im 5/42  vessel]
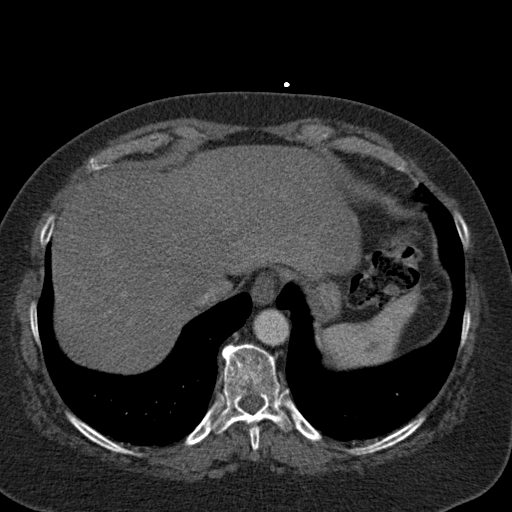
[im 5/42  lung]
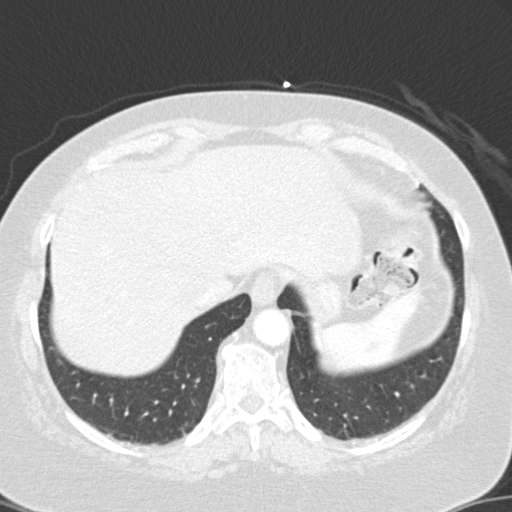
[im 10/42  vessel]
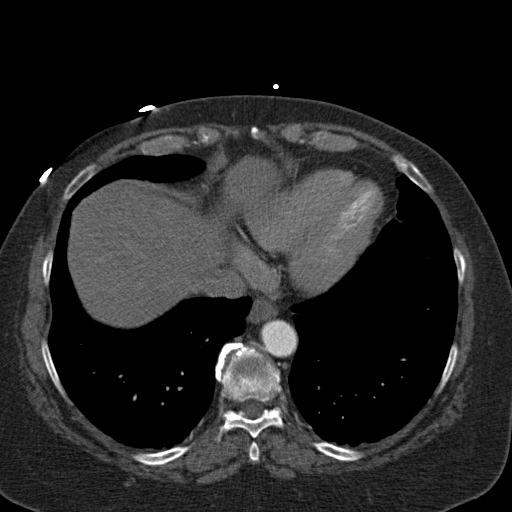
[im 14/42  vessel]
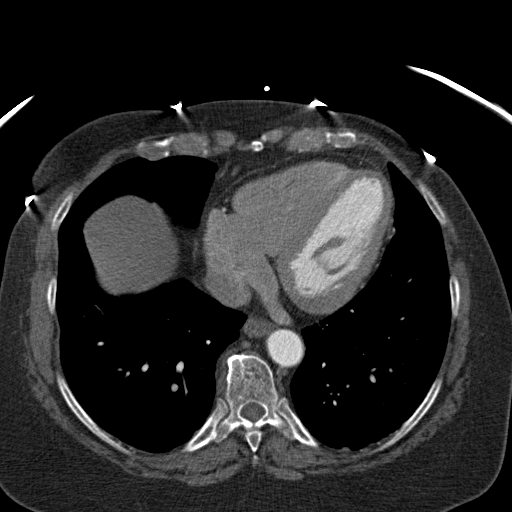
[im 19/42  vessel]
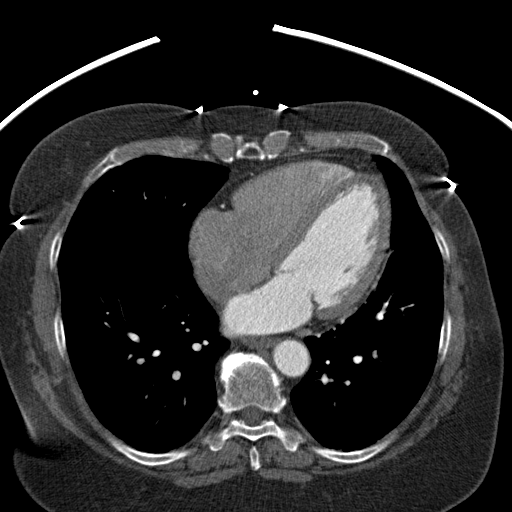
[im 23/42  vessel]
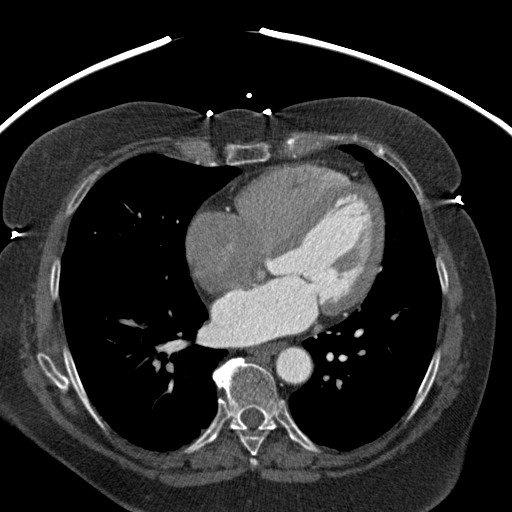
[im 23/42  lung]
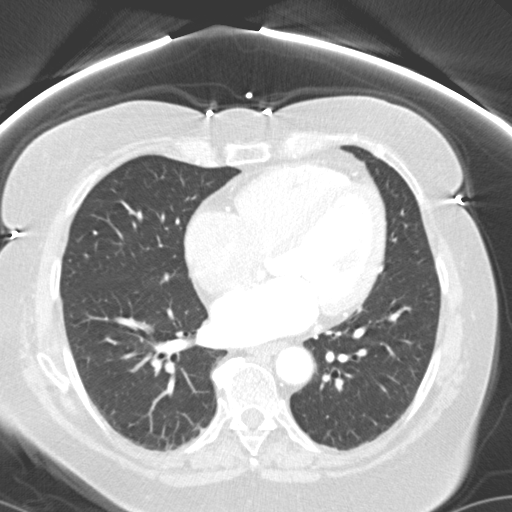
[im 28/42  vessel]
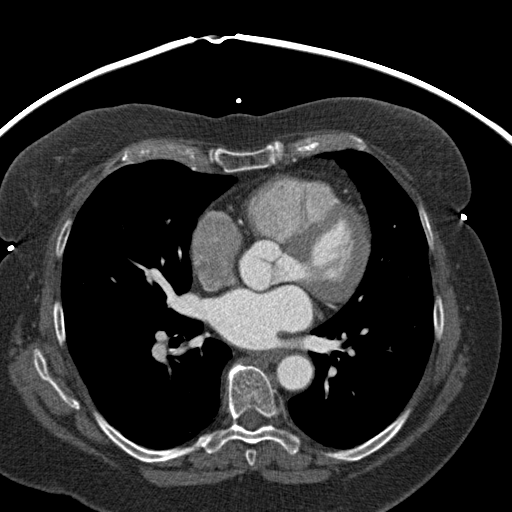
[im 32/42  vessel]
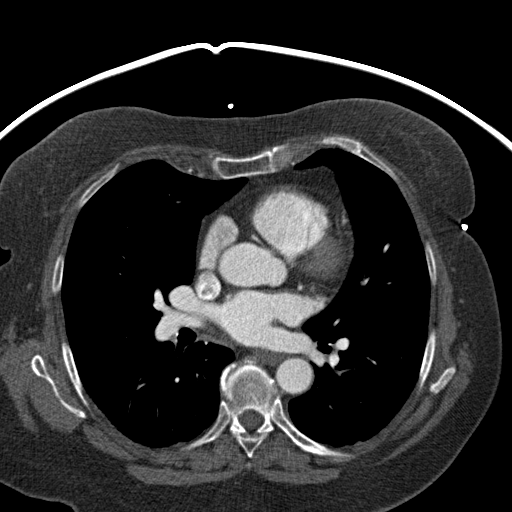
[im 37/42  vessel]
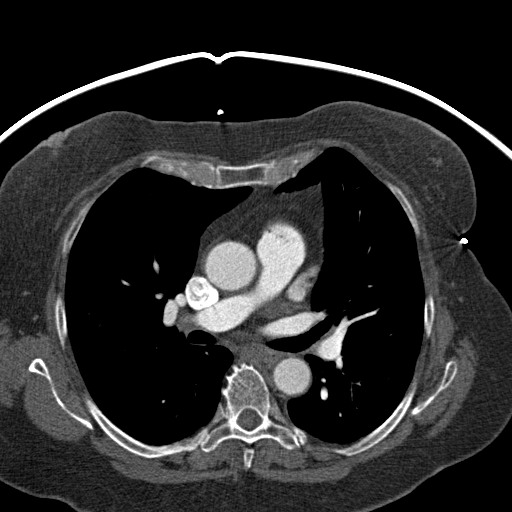

[16 of 20 positions shown; findings below may reference images not displayed]

IMPRESSION: 1.  Anomalous origin of the right coronary from the left sinus of
Valsalva with an inter-aortic  and pulmonary artery course. There
is a slit-like origin of the right coronary artery beginning at its
ostium from the left sinus of Valsalva.  This is a potentially
ischemia producing anatomic variant.

Addendum Ends
OVER-READ INTERPRETATION - CT CHEST

The following report is an over-read performed by radiologist Dr.
[DATE].  This over-read does not include interpretation of
cardiac or coronary anatomy or pathology.  The CTA interpretation
by the cardiologist is attached.
FINDINGS: The visualized lung bases are clear.  There is no
pleural effusion.  Postsurgical changes are noted status post
gastric bypass.
IMPRESSION: No significant extracardiac findings are demonstrated in the lower
chest.

## 2009-09-19 ENCOUNTER — Other Ambulatory Visit: Admission: RE | Admit: 2009-09-19 | Discharge: 2009-09-19 | Payer: Self-pay | Admitting: Obstetrics and Gynecology

## 2009-09-20 ENCOUNTER — Inpatient Hospital Stay (HOSPITAL_COMMUNITY): Admission: RE | Admit: 2009-09-20 | Discharge: 2009-09-23 | Payer: Self-pay | Admitting: Orthopaedic Surgery

## 2011-01-10 ENCOUNTER — Encounter
Admission: RE | Admit: 2011-01-10 | Discharge: 2011-01-10 | Payer: Self-pay | Source: Home / Self Care | Attending: Obstetrics and Gynecology | Admitting: Obstetrics and Gynecology

## 2011-02-03 ENCOUNTER — Other Ambulatory Visit (HOSPITAL_COMMUNITY)
Admission: RE | Admit: 2011-02-03 | Discharge: 2011-02-03 | Disposition: A | Discharge status: Home / Self Care | Payer: Managed Care, Other (non HMO) | Source: Ambulatory Visit | Attending: Obstetrics and Gynecology | Admitting: Obstetrics and Gynecology

## 2011-02-03 DIAGNOSIS — Z01419 Encounter for gynecological examination (general) (routine) without abnormal findings: Secondary | ICD-10-CM | POA: Insufficient documentation

## 2011-02-04 ENCOUNTER — Other Ambulatory Visit: Payer: Self-pay | Admitting: Obstetrics and Gynecology

## 2011-03-05 ENCOUNTER — Encounter (HOSPITAL_COMMUNITY): Payer: Managed Care, Other (non HMO) | Attending: Neurology

## 2011-03-05 DIAGNOSIS — D509 Iron deficiency anemia, unspecified: Secondary | ICD-10-CM | POA: Insufficient documentation

## 2011-03-21 LAB — CBC
HCT: 31.6 % — ABNORMAL LOW (ref 36.0–46.0)
HCT: 33.1 % — ABNORMAL LOW (ref 36.0–46.0)
HCT: 34.3 % — ABNORMAL LOW (ref 36.0–46.0)
HCT: 39.7 % (ref 36.0–46.0)
Hemoglobin: 10.6 g/dL — ABNORMAL LOW (ref 12.0–15.0)
Hemoglobin: 11.1 g/dL — ABNORMAL LOW (ref 12.0–15.0)
Hemoglobin: 11.9 g/dL — ABNORMAL LOW (ref 12.0–15.0)
Hemoglobin: 13.5 g/dL (ref 12.0–15.0)
MCHC: 33.4 g/dL (ref 30.0–36.0)
MCHC: 33.7 g/dL (ref 30.0–36.0)
MCHC: 33.9 g/dL (ref 30.0–36.0)
MCHC: 34.5 g/dL (ref 30.0–36.0)
MCV: 92.7 fL (ref 78.0–100.0)
MCV: 93.3 fL (ref 78.0–100.0)
MCV: 93.8 fL (ref 78.0–100.0)
MCV: 94.1 fL (ref 78.0–100.0)
Platelets: 106 10*3/uL — ABNORMAL LOW (ref 150–400)
Platelets: 106 10*3/uL — ABNORMAL LOW (ref 150–400)
Platelets: 148 10*3/uL — ABNORMAL LOW (ref 150–400)
Platelets: 182 10*3/uL (ref 150–400)
RBC: 3.37 MIL/uL — ABNORMAL LOW (ref 3.87–5.11)
RBC: 3.52 MIL/uL — ABNORMAL LOW (ref 3.87–5.11)
RBC: 3.68 MIL/uL — ABNORMAL LOW (ref 3.87–5.11)
RBC: 4.28 MIL/uL (ref 3.87–5.11)
RDW: 13.8 % (ref 11.5–15.5)
RDW: 13.8 % (ref 11.5–15.5)
RDW: 13.9 % (ref 11.5–15.5)
RDW: 13.9 % (ref 11.5–15.5)
WBC: 6 10*3/uL (ref 4.0–10.5)
WBC: 6.2 10*3/uL (ref 4.0–10.5)
WBC: 6.7 10*3/uL (ref 4.0–10.5)
WBC: 6.9 10*3/uL (ref 4.0–10.5)

## 2011-03-21 LAB — BASIC METABOLIC PANEL
BUN: 8 mg/dL (ref 6–23)
BUN: 8 mg/dL (ref 6–23)
BUN: 9 mg/dL (ref 6–23)
CO2: 28 mEq/L (ref 19–32)
CO2: 29 mEq/L (ref 19–32)
CO2: 31 mEq/L (ref 19–32)
Calcium: 7.6 mg/dL — ABNORMAL LOW (ref 8.4–10.5)
Calcium: 8.2 mg/dL — ABNORMAL LOW (ref 8.4–10.5)
Calcium: 8.4 mg/dL (ref 8.4–10.5)
Chloride: 102 mEq/L (ref 96–112)
Chloride: 105 mEq/L (ref 96–112)
Chloride: 107 mEq/L (ref 96–112)
Creatinine, Ser: 0.7 mg/dL (ref 0.4–1.2)
Creatinine, Ser: 0.72 mg/dL (ref 0.4–1.2)
Creatinine, Ser: 0.8 mg/dL (ref 0.4–1.2)
GFR calc Af Amer: >60 mL/min (ref >60)
GFR calc Af Amer: >60 mL/min (ref >60)
GFR calc Af Amer: >60 mL/min (ref >60)
GFR calc non Af Amer: >60 mL/min (ref >60)
GFR calc non Af Amer: >60 mL/min (ref >60)
GFR calc non Af Amer: >60 mL/min (ref >60)
Glucose, Bld: 103 mg/dL — ABNORMAL HIGH (ref 70–99)
Glucose, Bld: 110 mg/dL — ABNORMAL HIGH (ref 70–99)
Glucose, Bld: 97 mg/dL (ref 70–99)
Potassium: 3.8 mEq/L (ref 3.5–5.1)
Potassium: 4.6 mEq/L (ref 3.5–5.1)
Potassium: 4.9 mEq/L (ref 3.5–5.1)
Sodium: 136 mEq/L (ref 135–145)
Sodium: 140 mEq/L (ref 135–145)
Sodium: 141 mEq/L (ref 135–145)

## 2011-03-21 LAB — PROTIME-INR
INR: 1.01 (ref 0.00–1.49)
INR: 1.17 (ref 0.00–1.49)
INR: 1.53 — ABNORMAL HIGH (ref 0.00–1.49)
INR: 2.09 — ABNORMAL HIGH (ref 0.00–1.49)
Prothrombin Time: 13.2 seconds (ref 11.6–15.2)
Prothrombin Time: 14.8 seconds (ref 11.6–15.2)
Prothrombin Time: 18.3 seconds — ABNORMAL HIGH (ref 11.6–15.2)
Prothrombin Time: 23.3 seconds — ABNORMAL HIGH (ref 11.6–15.2)

## 2011-04-28 ENCOUNTER — Encounter (HOSPITAL_COMMUNITY): Payer: Managed Care, Other (non HMO) | Attending: Neurology

## 2011-04-28 DIAGNOSIS — D509 Iron deficiency anemia, unspecified: Secondary | ICD-10-CM | POA: Insufficient documentation

## 2011-04-28 DIAGNOSIS — M810 Age-related osteoporosis without current pathological fracture: Secondary | ICD-10-CM | POA: Insufficient documentation

## 2011-04-29 NOTE — Cardiovascular Report (Signed)
NAMEANAI, KHATUN NO.:  [id number]   MEDICAL RECORD NO.:  [id number]          PATIENT TYPE:  OIB   LOCATION:  1962                         FACILITY:  [facility]   PHYSICIAN:  [name], M.D.   DATE OF BIRTH:  09-04-1946   DATE OF PROCEDURE:  04/17/2009  DATE OF DISCHARGE:  04/17/2009                            CARDIAC CATHETERIZATION   INDICATION:  Recurring left chest discomfort, family history of coronary  artery disease and personal risk factors including hypertension.  Study  is being done to rule out obstructive CAD.   PROCEDURE PERFORMED:  1. Left heart cath.  2. Selective coronary angiography.  3. Left ventriculography.   DESCRIPTION:  After informed consent, a 4-French arterial sheath was  placed using the modified Seldinger technique.  A 4-French A2  multipurpose catheter was then used for hemodynamic recordings, left  ventriculography by hand injection, and attempted left and right  selective coronary angiography.  We used #4-French left Judkins catheter  for left coronary angiography.  We ultimately used multiple catheters in  attempting to directly cannulate the right coronary artery which we were  able to demonstrate arising from the left sinus of Valsalva near the os  of the left main.  We were never able to directly engage this vessel  with 4-French AL-1, 4-French AL-2, 4-French AR-2, 4-French multipurpose  catheter, and the #4-French left Judkins catheter.  After reviewing the  angiograms, a 4-French #5 Judkins catheter was then helpful.  The  patient's left sinus of Valsalva was anatomically small and prevented  easy progression of Amplatz catheters up the anterior aortic wall.  They  were tend to selectively engage the left main.  No complications  occurred during the procedure.  Hemostasis was achieved with manual  compression.   RESULTS:  1. Hemodynamic data:      a.     Aortic pressure 125/64.      b.     Left ventricular pressure  139/17 mmHg.  2. Left ventriculography:  The LV cavity size and systolic function      were normal.  The EF was 65%.  3. Coronary angiography:      a.     Left main:  Left main coronary artery is widely patent.      b.     Left anterior descending coronary artery:  Large vessel       transapical in its extent given origin to one diagonal branch       proximally.  The entire LAD system is normal.      c.     Circumflex artery:  Circumflex gives origin to one dominant       obtuse marginal branch and is entirely normal.      d.     Right coronary:  The right coronary artery arises       anomalously from the left sinus of Valsalva near the origin of the       left main.  We have not been able to selectively cannulate the       right coronary  os.  The vessel is patent without obvious       obstruction and courses to the inferior wall.  It is a relatively       large vessel and was dominant.   CONCLUSIONS:  1. 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  2. Normal left coronary system including the left main.  3. Small left sinus of Valsalva.  4. Normal left ventricular function.   PLAN:  Consider stress Cardiolite study to rule out evidence of inferior  wall ischemia and/or coronary CTA to demonstrate the course of the  anomalous RCA.  My impression is that this is a relatively safe anomaly  for this patient the right coronary territory is relatively small.  The  anomalous RCA on the left sinus of Valsalva is almost 100%  intrapulmonary artery aortic root in its course.  If we can demonstrate  ischemia or high-grade slit-like obstruction by either Cardiolite or  coronary CTA no further management issues will be undertaken.  The  patient should be on beta-blockers.      [name], M.D.   Electronically Signed     HWS/MEDQ  D:  04/17/2009  T:  04/18/2009  Job:  [id number]   cc:   Rolland Porter, M.D.

## 2011-05-02 NOTE — Op Note (Signed)
NAME:  [name], [name]                            ACCOUNT NO.:  [id number]   MEDICAL RECORD NO.:  [id number]                   PATIENT TYPE:  OIB   LOCATION:  5024                                 FACILITY:  [facility]   PHYSICIAN:  Monico Blitz. Rhona Raider, M.D.             DATE OF BIRTH:  October 06, 1946   DATE OF PROCEDURE:  08/11/2002  DATE OF DISCHARGE:  08/12/2002                                 OPERATIVE REPORT   PREOPERATIVE DIAGNOSES:  1. Right knee torn medial meniscus.  2. Right knee degenerative joint disease.   POSTOPERATIVE DIAGNOSES:  1. Right knee torn medial meniscus.  2. Right knee degenerative joint disease.   OPERATION PERFORMED:  1. Right knee partial medial meniscectomy.  2. Right knee chondroplasty, two compartment.   ANESTHESIA:  General.   SURGEON:  Monico Blitz. Rhona Raider, M.D.   ASSISTANT:  Dione Housekeeper, P.A.   INDICATIONS FOR PROCEDURE:  The patient is a 65 year old woman with a long  history of right knee pain.  This has persisted despite oral anti-  inflammatories, bracing and physical therapy.  She has had an MRI scan which  shows some degenerative signal in the posterior horn of the medial meniscus  and probably a tear.  She also has some degenerative findings.  She was  offered an arthroscopy.  The procedure was discussed with the patient and  informed operative consent was obtained after discussion of possible  complications of reaction to anesthesia, infection, neurovascular injury.  She was taken to the main operating room rather than the outpatient surgery  center due to her history of sleep apnea.   DESCRIPTION OF PROCEDURE:  The patient was taken to the operating suite  where general anesthesia was applied without difficulty.  The patient was  positioned supine and prepped and draped in the normal sterile fashion.  After administration of preop IV antibiotics of the right knee was performed  through a total of two inferior portals.  Suprapatellar pouch  was benign  while the patellofemoral joint exhibited grade 3 chondromalacia addressed  with a thorough chondroplasty of the undersurface of the patella.  The  medial compartment likewise showed some grade 3 change more focal.  Chondroplasty was done here as well.  She had a degenerative tear of the  posterior horn of the medial meniscus addressed with about a 10% partial  medial meniscectomy back to stable structures.  The lateral compartment was  completely benign with no evidence of meniscal articular cartilage injury.  The ACL was intact.  The knee was thoroughly irrigated at the end of the  case followed by placement of Marcaine with epinephrine and morphine plus  Depo-Medrol.  Adaptic was placed on the portals followed by dry gauze and a  loose Ace wrap.  Estimated blood loss and intraoperative fluids can be  obtained from anesthesia records.    DISPOSITION:  he patient was extubated in the  operating room and taken to  the recovery room in stable condition.  Plans were for the patient  to stay  overnight for observation due to her sleep apnea and pain control.  She will  likely be discharged to home in the morning.                                                 Monico Blitz Rhona Raider, M.D.    PGD/MEDQ  D:  08/11/2002  T:  08/15/2002  Job:  [id number]

## 2011-05-02 NOTE — Op Note (Signed)
[facility]  Patient:    [name], [name] Visit Number: [id number] MRN: [id number]          Service Type: SUR Location: 4W 0451 01 Attending Physician:  Colin Rhein Dictated by:   Colin Rhein, M.D. Proc. 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  OPERATION:  Right gastrocnemius slide.  ANESTHESIA:  General endotracheal tube.  SURGEON:  Colin Rhein, M.D.  ASSISTANT:  Olivia Mackie Shuford, P.A.-C.  ESTIMATED BLOOD LOSS:  Minimal.  TOURNIQUET TIME:  None.  COMPLICATIONS:  None.  DISPOSITION:  Stable to PAR.  INDICATIONS:  This is a 65 year old female, who has had longstanding plantar medial heel pain that has failed conservative management as well as ______. She was consented for the above procedure.  All risks which include infection; neurovascular injury, especially lateral foot numbness; DVT; possible PE; worsening of pain; same pain were all explained.  Questions were answered.  DESCRIPTION OF OPERATION:  The patient brought to the operating room and placed in the supine position.  After adequate general endotracheal tube anesthesia was administered as well as Ancef 1 g IV piggyback, the right lower extremity was then prepped and draped in a sterile manner.  We then made a longitudinal incision on the medial aspect of the medial head of the gastroc. Dissection was carried down through adipose tissue.  Hemostasis was obtained. The fascia was opened in line with the incision.  Gastroc soleus interval was identified.  Soft tissues were elevated off the posterior aspect of the gastroc tendon.  The sural nerve was identified and protected posteriorly. The gastroc tendon was then released with the Mayo  scissors.  This had an excellent release of the tight gastroc.  The wound was copiously irrigated with normal saline.  The subcu was closed with 3-0 Vicryl.  Skin was closed with 4-0 Monocryl subcuticular stitch.  A sterile dressing was applied.  A ______ boot was applied.  The patient stable to PAR. Dictated by:   Colin Rhein, M.D. Attending Physician:  Colin Rhein DD:  05/20/02 TD:  05/23/02 Job: [id number]

## 2012-03-19 ENCOUNTER — Other Ambulatory Visit: Payer: Self-pay | Admitting: Internal Medicine

## 2012-03-19 ENCOUNTER — Other Ambulatory Visit: Payer: Self-pay | Admitting: Obstetrics and Gynecology

## 2012-03-19 DIAGNOSIS — Z1231 Encounter for screening mammogram for malignant neoplasm of breast: Secondary | ICD-10-CM

## 2012-03-31 ENCOUNTER — Ambulatory Visit
Admission: RE | Admit: 2012-03-31 | Discharge: 2012-03-31 | Disposition: A | Discharge status: Home / Self Care | Payer: Managed Care, Other (non HMO) | Source: Ambulatory Visit | Attending: Obstetrics and Gynecology | Admitting: Obstetrics and Gynecology

## 2012-03-31 DIAGNOSIS — Z1231 Encounter for screening mammogram for malignant neoplasm of breast: Secondary | ICD-10-CM

## 2012-04-13 ENCOUNTER — Other Ambulatory Visit: Payer: Self-pay | Admitting: Obstetrics and Gynecology

## 2012-04-13 ENCOUNTER — Other Ambulatory Visit (HOSPITAL_COMMUNITY)
Admission: RE | Admit: 2012-04-13 | Discharge: 2012-04-13 | Disposition: A | Discharge status: Home / Self Care | Payer: Managed Care, Other (non HMO) | Source: Ambulatory Visit | Attending: Obstetrics and Gynecology | Admitting: Obstetrics and Gynecology

## 2012-04-13 DIAGNOSIS — Z01419 Encounter for gynecological examination (general) (routine) without abnormal findings: Secondary | ICD-10-CM | POA: Insufficient documentation

## 2012-06-22 ENCOUNTER — Other Ambulatory Visit: Payer: Self-pay | Admitting: Neurology

## 2012-06-22 DIAGNOSIS — G253 Myoclonus: Secondary | ICD-10-CM

## 2012-06-22 DIAGNOSIS — K909 Intestinal malabsorption, unspecified: Secondary | ICD-10-CM

## 2012-06-30 ENCOUNTER — Other Ambulatory Visit (HOSPITAL_COMMUNITY): Payer: Self-pay | Admitting: *Deleted

## 2012-07-02 ENCOUNTER — Encounter (HOSPITAL_COMMUNITY)
Admission: RE | Admit: 2012-07-02 | Discharge: 2012-07-02 | Disposition: A | Discharge status: Home / Self Care | Payer: Managed Care, Other (non HMO) | Source: Ambulatory Visit | Attending: Neurology | Admitting: Neurology

## 2012-07-02 ENCOUNTER — Encounter (HOSPITAL_COMMUNITY): Payer: Self-pay

## 2012-07-02 DIAGNOSIS — G2581 Restless legs syndrome: Secondary | ICD-10-CM | POA: Insufficient documentation

## 2012-07-02 HISTORY — DX: Personal history of other diseases of the digestive system: Z87.19

## 2012-07-02 HISTORY — DX: Headache: R51

## 2012-07-02 HISTORY — DX: Anemia, unspecified: D64.9

## 2012-07-02 HISTORY — DX: Unspecified osteoarthritis, unspecified site: M19.90

## 2012-07-02 HISTORY — DX: Sleep apnea, unspecified: G47.30

## 2012-07-02 MED ORDER — SODIUM CHLORIDE 0.9 % IV SOLN
INTRAVENOUS | Status: DC
  Administered 2012-07-02: 250 mL via INTRAVENOUS

## 2012-07-02 MED ORDER — NA FERRIC GLUC CPLX IN SUCROSE 12.5 MG/ML IV SOLN
100.0000 mg | Freq: Once | INTRAVENOUS | Status: AC
  Administered 2012-07-02: 100 mg via INTRAVENOUS
  Filled 2012-07-02: qty 8

## 2012-07-02 NOTE — Progress Notes (Signed)
Ferric gluconate infused. Pt tol well. NS flush x 78min at 100cc/hr

## 2012-07-26 DIAGNOSIS — L255 Unspecified contact dermatitis due to plants, except food: Secondary | ICD-10-CM | POA: Diagnosis not present

## 2012-07-26 DIAGNOSIS — B029 Zoster without complications: Secondary | ICD-10-CM | POA: Diagnosis not present

## 2012-07-30 DIAGNOSIS — B029 Zoster without complications: Secondary | ICD-10-CM | POA: Diagnosis not present

## 2012-07-30 DIAGNOSIS — L255 Unspecified contact dermatitis due to plants, except food: Secondary | ICD-10-CM | POA: Diagnosis not present

## 2012-08-02 DIAGNOSIS — K219 Gastro-esophageal reflux disease without esophagitis: Secondary | ICD-10-CM | POA: Diagnosis not present

## 2012-08-02 DIAGNOSIS — L255 Unspecified contact dermatitis due to plants, except food: Secondary | ICD-10-CM | POA: Diagnosis not present

## 2012-08-02 DIAGNOSIS — B029 Zoster without complications: Secondary | ICD-10-CM | POA: Diagnosis not present

## 2012-08-09 DIAGNOSIS — Z0000 Encounter for general adult medical examination without abnormal findings: Secondary | ICD-10-CM | POA: Diagnosis not present

## 2012-08-09 DIAGNOSIS — D509 Iron deficiency anemia, unspecified: Secondary | ICD-10-CM | POA: Diagnosis not present

## 2012-08-09 DIAGNOSIS — I10 Essential (primary) hypertension: Secondary | ICD-10-CM | POA: Diagnosis not present

## 2012-08-10 DIAGNOSIS — Z23 Encounter for immunization: Secondary | ICD-10-CM | POA: Diagnosis not present

## 2012-08-10 DIAGNOSIS — IMO0001 Reserved for inherently not codable concepts without codable children: Secondary | ICD-10-CM | POA: Diagnosis not present

## 2012-08-10 DIAGNOSIS — D126 Benign neoplasm of colon, unspecified: Secondary | ICD-10-CM | POA: Diagnosis not present

## 2012-08-10 DIAGNOSIS — I8390 Asymptomatic varicose veins of unspecified lower extremity: Secondary | ICD-10-CM | POA: Diagnosis not present

## 2012-09-08 ENCOUNTER — Other Ambulatory Visit: Payer: Self-pay | Admitting: Dermatology

## 2012-09-08 DIAGNOSIS — D1739 Benign lipomatous neoplasm of skin and subcutaneous tissue of other sites: Secondary | ICD-10-CM | POA: Diagnosis not present

## 2012-09-08 DIAGNOSIS — I789 Disease of capillaries, unspecified: Secondary | ICD-10-CM | POA: Diagnosis not present

## 2012-09-08 DIAGNOSIS — L259 Unspecified contact dermatitis, unspecified cause: Secondary | ICD-10-CM | POA: Diagnosis not present

## 2012-09-08 DIAGNOSIS — L570 Actinic keratosis: Secondary | ICD-10-CM | POA: Diagnosis not present

## 2012-09-08 DIAGNOSIS — D485 Neoplasm of uncertain behavior of skin: Secondary | ICD-10-CM | POA: Diagnosis not present

## 2012-09-15 DIAGNOSIS — Z23 Encounter for immunization: Secondary | ICD-10-CM | POA: Diagnosis not present

## 2012-09-30 DIAGNOSIS — H9209 Otalgia, unspecified ear: Secondary | ICD-10-CM | POA: Diagnosis not present

## 2012-09-30 DIAGNOSIS — N760 Acute vaginitis: Secondary | ICD-10-CM | POA: Diagnosis not present

## 2012-09-30 DIAGNOSIS — R300 Dysuria: Secondary | ICD-10-CM | POA: Diagnosis not present

## 2012-09-30 DIAGNOSIS — N390 Urinary tract infection, site not specified: Secondary | ICD-10-CM | POA: Diagnosis not present

## 2012-09-30 DIAGNOSIS — E669 Obesity, unspecified: Secondary | ICD-10-CM | POA: Diagnosis not present

## 2012-10-13 DIAGNOSIS — M545 Low back pain, unspecified: Secondary | ICD-10-CM | POA: Diagnosis not present

## 2012-10-13 DIAGNOSIS — N951 Menopausal and female climacteric states: Secondary | ICD-10-CM | POA: Diagnosis not present

## 2012-10-13 DIAGNOSIS — N390 Urinary tract infection, site not specified: Secondary | ICD-10-CM | POA: Diagnosis not present

## 2012-10-13 DIAGNOSIS — R300 Dysuria: Secondary | ICD-10-CM | POA: Diagnosis not present

## 2012-10-28 DIAGNOSIS — R109 Unspecified abdominal pain: Secondary | ICD-10-CM | POA: Diagnosis not present

## 2012-11-17 DIAGNOSIS — M1710 Unilateral primary osteoarthritis, unspecified knee: Secondary | ICD-10-CM | POA: Diagnosis not present

## 2012-11-22 DIAGNOSIS — N3020 Other chronic cystitis without hematuria: Secondary | ICD-10-CM | POA: Diagnosis not present

## 2012-11-22 DIAGNOSIS — R351 Nocturia: Secondary | ICD-10-CM | POA: Diagnosis not present

## 2012-11-23 DIAGNOSIS — N281 Cyst of kidney, acquired: Secondary | ICD-10-CM | POA: Diagnosis not present

## 2012-11-23 DIAGNOSIS — N3020 Other chronic cystitis without hematuria: Secondary | ICD-10-CM | POA: Diagnosis not present

## 2012-11-29 DIAGNOSIS — N281 Cyst of kidney, acquired: Secondary | ICD-10-CM | POA: Diagnosis not present

## 2012-11-29 DIAGNOSIS — N3020 Other chronic cystitis without hematuria: Secondary | ICD-10-CM | POA: Diagnosis not present

## 2012-12-06 DIAGNOSIS — M1710 Unilateral primary osteoarthritis, unspecified knee: Secondary | ICD-10-CM | POA: Diagnosis not present

## 2012-12-27 DIAGNOSIS — M25569 Pain in unspecified knee: Secondary | ICD-10-CM | POA: Diagnosis not present

## 2012-12-27 DIAGNOSIS — M25519 Pain in unspecified shoulder: Secondary | ICD-10-CM | POA: Diagnosis not present

## 2013-01-04 ENCOUNTER — Encounter (HOSPITAL_COMMUNITY): Payer: Self-pay | Admitting: *Deleted

## 2013-01-04 ENCOUNTER — Emergency Department (HOSPITAL_COMMUNITY)
Admission: EM | Admit: 2013-01-04 | Discharge: 2013-01-04 | Disposition: A | Discharge status: Home / Self Care | Payer: Medicare Other | Attending: Emergency Medicine | Admitting: Emergency Medicine

## 2013-01-04 DIAGNOSIS — R197 Diarrhea, unspecified: Secondary | ICD-10-CM | POA: Insufficient documentation

## 2013-01-04 DIAGNOSIS — R112 Nausea with vomiting, unspecified: Secondary | ICD-10-CM | POA: Diagnosis not present

## 2013-01-04 DIAGNOSIS — R6883 Chills (without fever): Secondary | ICD-10-CM | POA: Diagnosis not present

## 2013-01-04 DIAGNOSIS — Z8669 Personal history of other diseases of the nervous system and sense organs: Secondary | ICD-10-CM | POA: Diagnosis not present

## 2013-01-04 DIAGNOSIS — Z862 Personal history of diseases of the blood and blood-forming organs and certain disorders involving the immune mechanism: Secondary | ICD-10-CM | POA: Insufficient documentation

## 2013-01-04 DIAGNOSIS — H538 Other visual disturbances: Secondary | ICD-10-CM | POA: Diagnosis not present

## 2013-01-04 DIAGNOSIS — IMO0001 Reserved for inherently not codable concepts without codable children: Secondary | ICD-10-CM | POA: Diagnosis not present

## 2013-01-04 DIAGNOSIS — Z8679 Personal history of other diseases of the circulatory system: Secondary | ICD-10-CM | POA: Insufficient documentation

## 2013-01-04 DIAGNOSIS — Z79899 Other long term (current) drug therapy: Secondary | ICD-10-CM | POA: Diagnosis not present

## 2013-01-04 DIAGNOSIS — R079 Chest pain, unspecified: Secondary | ICD-10-CM | POA: Diagnosis not present

## 2013-01-04 DIAGNOSIS — R000 Tachycardia, unspecified: Secondary | ICD-10-CM | POA: Insufficient documentation

## 2013-01-04 LAB — BASIC METABOLIC PANEL
CO2: 22 mEq/L (ref 19–32)
Chloride: 104 mEq/L (ref 96–112)
Glucose, Bld: 138 mg/dL — ABNORMAL HIGH (ref 70–99)
Potassium: 4.7 mEq/L (ref 3.5–5.1)
Sodium: 138 mEq/L (ref 135–145)

## 2013-01-04 LAB — POCT I-STAT TROPONIN I

## 2013-01-04 LAB — CBC
Hemoglobin: 14.9 g/dL (ref 12.0–15.0)
RBC: 4.92 MIL/uL (ref 3.87–5.11)

## 2013-01-04 MED ORDER — PROMETHAZINE HCL 25 MG PO TABS: 25.0000 mg | ORAL_TABLET | Freq: Four times a day (QID) | ORAL | Status: DC | PRN

## 2013-01-04 MED ORDER — ACETAMINOPHEN 325 MG PO TABS
650.0000 mg | ORAL_TABLET | Freq: Once | ORAL | Status: AC
  Administered 2013-01-04: 650 mg via ORAL
  Filled 2013-01-04: qty 2

## 2013-01-04 MED ORDER — SODIUM CHLORIDE 0.9 % IV BOLUS (SEPSIS)
2000.0000 mL | Freq: Once | INTRAVENOUS | Status: AC
  Administered 2013-01-04: 2000 mL via INTRAVENOUS

## 2013-01-04 MED ORDER — ONDANSETRON HCL 4 MG/2ML IJ SOLN
4.0000 mg | Freq: Once | INTRAMUSCULAR | Status: AC
  Administered 2013-01-04: 4 mg via INTRAVENOUS
  Filled 2013-01-04: qty 2

## 2013-01-04 MED ORDER — ONDANSETRON HCL 4 MG PO TABS: 4.0000 mg | ORAL_TABLET | Freq: Four times a day (QID) | ORAL | Status: DC

## 2013-01-04 MED ORDER — PROMETHAZINE HCL 25 MG PO TABS
25.0000 mg | ORAL_TABLET | Freq: Once | ORAL | Status: AC
  Administered 2013-01-04: 25 mg via ORAL
  Filled 2013-01-04: qty 1

## 2013-01-04 NOTE — ED Provider Notes (Signed)
History     CSN: [id number]  Arrival date & time 01/04/13  1804   First MD Initiated Contact with Patient 01/04/13 1951      Chief Complaint  Patient presents with  . Nausea  . Emesis  . Diarrhea  . Blurred Vision  . Chest Pain    (Consider location/radiation/quality/duration/timing/severity/associated sxs/prior treatment) Patient is a 67 y.o. female presenting with vomiting. The history is provided by the patient.  Emesis  This is a new problem. The current episode started 12 to 24 hours ago. The problem occurs more than 10 times per day. The problem has not changed since onset.The emesis has an appearance of stomach contents. The maximum temperature recorded prior to her arrival was 101 to 101.9 F. The fever has been present for less than 1 day. Associated symptoms include chills, diarrhea, a fever and myalgias. Pertinent negatives include no abdominal pain and no cough. Risk factors: no risk factors.    Past Medical History  Diagnosis Date  . Sleep apnea     uses CPAP  . H/O hiatal hernia     surgery for hernia  . Headache     migraines  . Arthritis     generalized  . Mental disorder   . 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 Gastric bypass 2001  . Hiatal hernia repair   . Skin reduction 2003 2004    No family history on file.  History  Substance Use Topics  . Smoking status: Never Smoker   . Smokeless tobacco: Not on file  . Alcohol Use: No    OB History    Grav Para Term Preterm Abortions TAB SAB Ect Mult Living                  Review of Systems  Constitutional: Positive for fever and chills.  Respiratory: Negative for cough and shortness of breath.   Cardiovascular:       States after repetitive vomiting she developed some mild CP.  Gastrointestinal: Positive for vomiting and diarrhea. Negative for abdominal pain.  Musculoskeletal: Positive for myalgias.  All other systems reviewed  and are negative.    Allergies  Review of patient's allergies indicates no known allergies.  Home Medications   Current Outpatient Rx  Name  Route  Sig  Dispense  Refill  . BISOPROLOL FUMARATE 10 MG PO TABS   Oral   Take 10 mg by mouth 2 (two) times daily.         . DULOXETINE HCL 30 MG PO CPEP   Oral   Take 30 mg by mouth daily.         Marland Kitchen ESZOPICLONE 1 MG PO TABS   Oral   Take 1 mg by mouth at bedtime. Take immediately before bedtime         . GABAPENTIN 300 MG PO CAPS   Oral   Take 300 mg by mouth at bedtime.         Marland Kitchen LANSOPRAZOLE 30 MG PO CPDR   Oral   Take 30 mg by mouth 2 (two) times daily.           BP 142/71  Pulse 126  Temp 101.1 F (38.4 C) (Oral)  Resp 20  SpO2 95%  Physical Exam  Nursing note and vitals reviewed. Constitutional: She is oriented to person, place, and time. She appears well-developed and well-nourished. No distress.  HENT:  Head: Normocephalic and atraumatic.  Mouth/Throat: Oropharynx is clear and moist.  Eyes: Conjunctivae normal and EOM are normal. Pupils are equal, round, and reactive to light.  Neck: Normal range of motion. Neck supple.  Cardiovascular: Regular rhythm and intact distal pulses.  Tachycardia present.   No murmur heard. Pulmonary/Chest: Effort normal and breath sounds normal. No respiratory distress. She has no wheezes. She has no rales.  Abdominal: Soft. She exhibits no distension. There is no tenderness. There is no rebound and no guarding.  Musculoskeletal: Normal range of motion. She exhibits no edema and no tenderness.  Neurological: She is alert and oriented to person, place, and time.  Skin: Skin is warm and dry. No rash noted. No erythema.  Psychiatric: She has a normal mood and affect. Her behavior is normal.    ED Course  Procedures (including critical care time)  Labs Reviewed  CBC - Abnormal; Notable for the following:    WBC 11.0 (*)     All other components within normal limits  BASIC  METABOLIC PANEL - Abnormal; Notable for the following:    Glucose, Bld 138 (*)     GFR calc non Af Amer 86 (*)     All other components within normal limits  POCT I-STAT TROPONIN I   No results found.   Date: 01/04/2013  Rate: 126  Rhythm: sinus tachycardia  QRS Axis: normal  Intervals: normal  ST/T Wave abnormalities: normal  Conduction Disutrbances:none  Narrative Interpretation:   Old EKG Reviewed: unchanged   1. Nausea vomiting and diarrhea       MDM   Pt with symptoms most consistent with a viral process with fever/vomitting/diarrhea.  Denies bad food exposure and recent travel out of the country.  No recent abx.  No hx concerning for GU pathology or kidney stones.  Pt is awake and alert on exam without peritoneal signs.  Labs wnl.  Pt given IVF and zofran.  Will po challenge   9:59 PM Tolerating po's and wanting to go home.     Blanchie Dessert, MD 01/04/13 2159

## 2013-01-04 NOTE — ED Notes (Signed)
Pt states today started having nausea/vomiting/ diarrhea/weakness/blurred vision and chest pain/pressure, pt states the pressure is more in L arm. Pt states "I just feel bad bad bad".

## 2013-01-10 DIAGNOSIS — E669 Obesity, unspecified: Secondary | ICD-10-CM | POA: Diagnosis not present

## 2013-01-11 DIAGNOSIS — I10 Essential (primary) hypertension: Secondary | ICD-10-CM | POA: Diagnosis not present

## 2013-01-11 DIAGNOSIS — F909 Attention-deficit hyperactivity disorder, unspecified type: Secondary | ICD-10-CM | POA: Diagnosis not present

## 2013-01-20 DIAGNOSIS — M2240 Chondromalacia patellae, unspecified knee: Secondary | ICD-10-CM | POA: Diagnosis not present

## 2013-01-20 DIAGNOSIS — M23305 Other meniscus derangements, unspecified medial meniscus, unspecified knee: Secondary | ICD-10-CM | POA: Diagnosis not present

## 2013-01-20 DIAGNOSIS — IMO0002 Reserved for concepts with insufficient information to code with codable children: Secondary | ICD-10-CM | POA: Diagnosis not present

## 2013-01-20 DIAGNOSIS — M1710 Unilateral primary osteoarthritis, unspecified knee: Secondary | ICD-10-CM | POA: Diagnosis not present

## 2013-02-08 DIAGNOSIS — M1710 Unilateral primary osteoarthritis, unspecified knee: Secondary | ICD-10-CM | POA: Diagnosis not present

## 2013-02-08 DIAGNOSIS — F909 Attention-deficit hyperactivity disorder, unspecified type: Secondary | ICD-10-CM | POA: Diagnosis not present

## 2013-02-08 DIAGNOSIS — E7800 Pure hypercholesterolemia, unspecified: Secondary | ICD-10-CM | POA: Diagnosis not present

## 2013-02-08 DIAGNOSIS — G4700 Insomnia, unspecified: Secondary | ICD-10-CM | POA: Diagnosis not present

## 2013-02-08 DIAGNOSIS — IMO0002 Reserved for concepts with insufficient information to code with codable children: Secondary | ICD-10-CM | POA: Diagnosis not present

## 2013-02-16 DIAGNOSIS — IMO0002 Reserved for concepts with insufficient information to code with codable children: Secondary | ICD-10-CM | POA: Diagnosis not present

## 2013-02-16 DIAGNOSIS — M1710 Unilateral primary osteoarthritis, unspecified knee: Secondary | ICD-10-CM | POA: Diagnosis not present

## 2013-02-16 DIAGNOSIS — M25569 Pain in unspecified knee: Secondary | ICD-10-CM | POA: Diagnosis not present

## 2013-02-23 DIAGNOSIS — M25569 Pain in unspecified knee: Secondary | ICD-10-CM | POA: Diagnosis not present

## 2013-02-23 DIAGNOSIS — M1710 Unilateral primary osteoarthritis, unspecified knee: Secondary | ICD-10-CM | POA: Diagnosis not present

## 2013-03-03 DIAGNOSIS — M1710 Unilateral primary osteoarthritis, unspecified knee: Secondary | ICD-10-CM | POA: Diagnosis not present

## 2013-03-09 DIAGNOSIS — M1710 Unilateral primary osteoarthritis, unspecified knee: Secondary | ICD-10-CM | POA: Diagnosis not present

## 2013-03-16 DIAGNOSIS — M1710 Unilateral primary osteoarthritis, unspecified knee: Secondary | ICD-10-CM | POA: Diagnosis not present

## 2013-04-20 DIAGNOSIS — M25819 Other specified joint disorders, unspecified shoulder: Secondary | ICD-10-CM | POA: Diagnosis not present

## 2013-04-20 DIAGNOSIS — M545 Low back pain, unspecified: Secondary | ICD-10-CM | POA: Diagnosis not present

## 2013-05-11 DIAGNOSIS — M47817 Spondylosis without myelopathy or radiculopathy, lumbosacral region: Secondary | ICD-10-CM | POA: Diagnosis not present

## 2013-05-18 DIAGNOSIS — M545 Low back pain, unspecified: Secondary | ICD-10-CM | POA: Diagnosis not present

## 2013-05-18 DIAGNOSIS — H01009 Unspecified blepharitis unspecified eye, unspecified eyelid: Secondary | ICD-10-CM | POA: Diagnosis not present

## 2013-05-18 DIAGNOSIS — H43399 Other vitreous opacities, unspecified eye: Secondary | ICD-10-CM | POA: Diagnosis not present

## 2013-05-18 DIAGNOSIS — H35369 Drusen (degenerative) of macula, unspecified eye: Secondary | ICD-10-CM | POA: Diagnosis not present

## 2013-05-18 DIAGNOSIS — H2510 Age-related nuclear cataract, unspecified eye: Secondary | ICD-10-CM | POA: Diagnosis not present

## 2013-05-23 DIAGNOSIS — IMO0002 Reserved for concepts with insufficient information to code with codable children: Secondary | ICD-10-CM | POA: Diagnosis not present

## 2013-06-02 DIAGNOSIS — M47817 Spondylosis without myelopathy or radiculopathy, lumbosacral region: Secondary | ICD-10-CM | POA: Diagnosis not present

## 2013-06-27 DIAGNOSIS — M545 Low back pain, unspecified: Secondary | ICD-10-CM | POA: Diagnosis not present

## 2013-08-09 DIAGNOSIS — Z23 Encounter for immunization: Secondary | ICD-10-CM | POA: Diagnosis not present

## 2013-08-11 DIAGNOSIS — I10 Essential (primary) hypertension: Secondary | ICD-10-CM | POA: Diagnosis not present

## 2013-08-11 DIAGNOSIS — J309 Allergic rhinitis, unspecified: Secondary | ICD-10-CM | POA: Diagnosis not present

## 2013-08-11 DIAGNOSIS — E669 Obesity, unspecified: Secondary | ICD-10-CM | POA: Diagnosis not present

## 2013-08-22 DIAGNOSIS — M25819 Other specified joint disorders, unspecified shoulder: Secondary | ICD-10-CM | POA: Diagnosis not present

## 2013-09-03 DIAGNOSIS — J9801 Acute bronchospasm: Secondary | ICD-10-CM | POA: Diagnosis not present

## 2013-09-03 DIAGNOSIS — J209 Acute bronchitis, unspecified: Secondary | ICD-10-CM | POA: Diagnosis not present

## 2013-09-03 DIAGNOSIS — R042 Hemoptysis: Secondary | ICD-10-CM | POA: Diagnosis not present

## 2013-09-07 DIAGNOSIS — M19019 Primary osteoarthritis, unspecified shoulder: Secondary | ICD-10-CM | POA: Diagnosis not present

## 2013-09-07 DIAGNOSIS — M47817 Spondylosis without myelopathy or radiculopathy, lumbosacral region: Secondary | ICD-10-CM | POA: Diagnosis not present

## 2013-09-08 DIAGNOSIS — R05 Cough: Secondary | ICD-10-CM | POA: Diagnosis not present

## 2013-09-08 DIAGNOSIS — R059 Cough, unspecified: Secondary | ICD-10-CM | POA: Diagnosis not present

## 2013-09-08 DIAGNOSIS — J45901 Unspecified asthma with (acute) exacerbation: Secondary | ICD-10-CM | POA: Diagnosis not present

## 2013-09-29 DIAGNOSIS — Z23 Encounter for immunization: Secondary | ICD-10-CM | POA: Diagnosis not present

## 2013-10-27 ENCOUNTER — Other Ambulatory Visit: Payer: Self-pay

## 2013-10-27 DIAGNOSIS — M25569 Pain in unspecified knee: Secondary | ICD-10-CM | POA: Diagnosis not present

## 2013-10-27 DIAGNOSIS — M1710 Unilateral primary osteoarthritis, unspecified knee: Secondary | ICD-10-CM | POA: Diagnosis not present

## 2013-10-27 DIAGNOSIS — Z1231 Encounter for screening mammogram for malignant neoplasm of breast: Secondary | ICD-10-CM

## 2013-10-28 ENCOUNTER — Ambulatory Visit
Admission: RE | Admit: 2013-10-28 | Discharge: 2013-10-28 | Disposition: A | Discharge status: Home / Self Care | Payer: Medicare Other | Source: Ambulatory Visit

## 2013-10-28 DIAGNOSIS — Z1231 Encounter for screening mammogram for malignant neoplasm of breast: Secondary | ICD-10-CM | POA: Diagnosis not present

## 2013-11-02 DIAGNOSIS — M1710 Unilateral primary osteoarthritis, unspecified knee: Secondary | ICD-10-CM | POA: Diagnosis not present

## 2013-11-02 DIAGNOSIS — M25569 Pain in unspecified knee: Secondary | ICD-10-CM | POA: Diagnosis not present

## 2013-11-14 DIAGNOSIS — M1710 Unilateral primary osteoarthritis, unspecified knee: Secondary | ICD-10-CM | POA: Diagnosis not present

## 2013-11-21 DIAGNOSIS — M1710 Unilateral primary osteoarthritis, unspecified knee: Secondary | ICD-10-CM | POA: Diagnosis not present

## 2013-11-28 DIAGNOSIS — M1710 Unilateral primary osteoarthritis, unspecified knee: Secondary | ICD-10-CM | POA: Diagnosis not present

## 2013-12-05 DIAGNOSIS — M1710 Unilateral primary osteoarthritis, unspecified knee: Secondary | ICD-10-CM | POA: Diagnosis not present

## 2013-12-29 DIAGNOSIS — L821 Other seborrheic keratosis: Secondary | ICD-10-CM | POA: Diagnosis not present

## 2013-12-29 DIAGNOSIS — I781 Nevus, non-neoplastic: Secondary | ICD-10-CM | POA: Diagnosis not present

## 2013-12-29 DIAGNOSIS — D239 Other benign neoplasm of skin, unspecified: Secondary | ICD-10-CM | POA: Diagnosis not present

## 2013-12-29 DIAGNOSIS — Z808 Family history of malignant neoplasm of other organs or systems: Secondary | ICD-10-CM | POA: Diagnosis not present

## 2014-01-16 DIAGNOSIS — M25819 Other specified joint disorders, unspecified shoulder: Secondary | ICD-10-CM | POA: Diagnosis not present

## 2014-01-16 DIAGNOSIS — S9030XA Contusion of unspecified foot, initial encounter: Secondary | ICD-10-CM | POA: Diagnosis not present

## 2014-01-25 DIAGNOSIS — M899 Disorder of bone, unspecified: Secondary | ICD-10-CM | POA: Diagnosis not present

## 2014-01-25 DIAGNOSIS — M949 Disorder of cartilage, unspecified: Secondary | ICD-10-CM | POA: Diagnosis not present

## 2014-01-25 DIAGNOSIS — K146 Glossodynia: Secondary | ICD-10-CM | POA: Diagnosis not present

## 2014-01-25 DIAGNOSIS — I10 Essential (primary) hypertension: Secondary | ICD-10-CM | POA: Diagnosis not present

## 2014-01-25 DIAGNOSIS — D509 Iron deficiency anemia, unspecified: Secondary | ICD-10-CM | POA: Diagnosis not present

## 2014-01-27 DIAGNOSIS — M899 Disorder of bone, unspecified: Secondary | ICD-10-CM | POA: Diagnosis not present

## 2014-01-27 DIAGNOSIS — D509 Iron deficiency anemia, unspecified: Secondary | ICD-10-CM | POA: Diagnosis not present

## 2014-01-27 DIAGNOSIS — I10 Essential (primary) hypertension: Secondary | ICD-10-CM | POA: Diagnosis not present

## 2014-02-17 DIAGNOSIS — D509 Iron deficiency anemia, unspecified: Secondary | ICD-10-CM | POA: Diagnosis not present

## 2014-02-17 DIAGNOSIS — J029 Acute pharyngitis, unspecified: Secondary | ICD-10-CM | POA: Diagnosis not present

## 2014-02-17 DIAGNOSIS — E559 Vitamin D deficiency, unspecified: Secondary | ICD-10-CM | POA: Diagnosis not present

## 2014-02-17 DIAGNOSIS — B370 Candidal stomatitis: Secondary | ICD-10-CM | POA: Diagnosis not present

## 2014-03-24 DIAGNOSIS — M25579 Pain in unspecified ankle and joints of unspecified foot: Secondary | ICD-10-CM | POA: Diagnosis not present

## 2014-04-11 DIAGNOSIS — M19079 Primary osteoarthritis, unspecified ankle and foot: Secondary | ICD-10-CM | POA: Diagnosis not present

## 2014-04-11 DIAGNOSIS — M76829 Posterior tibial tendinitis, unspecified leg: Secondary | ICD-10-CM | POA: Diagnosis not present

## 2014-04-11 DIAGNOSIS — M7750 Other enthesopathy of unspecified foot: Secondary | ICD-10-CM | POA: Diagnosis not present

## 2014-05-02 DIAGNOSIS — H2510 Age-related nuclear cataract, unspecified eye: Secondary | ICD-10-CM | POA: Diagnosis not present

## 2014-05-02 DIAGNOSIS — H43399 Other vitreous opacities, unspecified eye: Secondary | ICD-10-CM | POA: Diagnosis not present

## 2014-05-02 DIAGNOSIS — H35369 Drusen (degenerative) of macula, unspecified eye: Secondary | ICD-10-CM | POA: Diagnosis not present

## 2014-05-02 DIAGNOSIS — H25019 Cortical age-related cataract, unspecified eye: Secondary | ICD-10-CM | POA: Diagnosis not present

## 2014-05-11 DIAGNOSIS — M25579 Pain in unspecified ankle and joints of unspecified foot: Secondary | ICD-10-CM | POA: Diagnosis not present

## 2014-06-07 DIAGNOSIS — M25519 Pain in unspecified shoulder: Secondary | ICD-10-CM | POA: Diagnosis not present

## 2014-06-07 DIAGNOSIS — M25579 Pain in unspecified ankle and joints of unspecified foot: Secondary | ICD-10-CM | POA: Diagnosis not present

## 2014-07-24 DIAGNOSIS — M19079 Primary osteoarthritis, unspecified ankle and foot: Secondary | ICD-10-CM | POA: Diagnosis not present

## 2014-07-28 DIAGNOSIS — M545 Low back pain, unspecified: Secondary | ICD-10-CM | POA: Diagnosis not present

## 2014-07-28 DIAGNOSIS — M25569 Pain in unspecified knee: Secondary | ICD-10-CM | POA: Diagnosis not present

## 2014-08-25 DIAGNOSIS — E559 Vitamin D deficiency, unspecified: Secondary | ICD-10-CM | POA: Diagnosis not present

## 2014-08-25 DIAGNOSIS — K219 Gastro-esophageal reflux disease without esophagitis: Secondary | ICD-10-CM | POA: Diagnosis not present

## 2014-08-25 DIAGNOSIS — J45909 Unspecified asthma, uncomplicated: Secondary | ICD-10-CM | POA: Diagnosis not present

## 2014-08-25 DIAGNOSIS — R42 Dizziness and giddiness: Secondary | ICD-10-CM | POA: Diagnosis not present

## 2014-09-01 DIAGNOSIS — I69998 Other sequelae following unspecified cerebrovascular disease: Secondary | ICD-10-CM | POA: Diagnosis not present

## 2014-09-01 DIAGNOSIS — H8110 Benign paroxysmal vertigo, unspecified ear: Secondary | ICD-10-CM | POA: Diagnosis not present

## 2014-09-07 DIAGNOSIS — H8110 Benign paroxysmal vertigo, unspecified ear: Secondary | ICD-10-CM | POA: Diagnosis not present

## 2014-09-07 DIAGNOSIS — I69998 Other sequelae following unspecified cerebrovascular disease: Secondary | ICD-10-CM | POA: Diagnosis not present

## 2014-09-08 DIAGNOSIS — M1710 Unilateral primary osteoarthritis, unspecified knee: Secondary | ICD-10-CM | POA: Diagnosis not present

## 2014-09-15 DIAGNOSIS — I69998 Other sequelae following unspecified cerebrovascular disease: Secondary | ICD-10-CM | POA: Diagnosis not present

## 2014-09-15 DIAGNOSIS — H8113 Benign paroxysmal vertigo, bilateral: Secondary | ICD-10-CM | POA: Diagnosis not present

## 2014-09-15 DIAGNOSIS — M1712 Unilateral primary osteoarthritis, left knee: Secondary | ICD-10-CM | POA: Diagnosis not present

## 2014-09-15 DIAGNOSIS — S46011S Strain of muscle(s) and tendon(s) of the rotator cuff of right shoulder, sequela: Secondary | ICD-10-CM | POA: Diagnosis not present

## 2014-09-22 DIAGNOSIS — M1712 Unilateral primary osteoarthritis, left knee: Secondary | ICD-10-CM | POA: Diagnosis not present

## 2014-09-26 DIAGNOSIS — Z23 Encounter for immunization: Secondary | ICD-10-CM | POA: Diagnosis not present

## 2014-09-29 DIAGNOSIS — M1712 Unilateral primary osteoarthritis, left knee: Secondary | ICD-10-CM | POA: Diagnosis not present

## 2014-10-06 DIAGNOSIS — M7542 Impingement syndrome of left shoulder: Secondary | ICD-10-CM | POA: Diagnosis not present

## 2014-10-06 DIAGNOSIS — M1712 Unilateral primary osteoarthritis, left knee: Secondary | ICD-10-CM | POA: Diagnosis not present

## 2014-10-13 DIAGNOSIS — M545 Low back pain: Secondary | ICD-10-CM | POA: Diagnosis not present

## 2014-10-19 ENCOUNTER — Ambulatory Visit (INDEPENDENT_AMBULATORY_CARE_PROVIDER_SITE_OTHER): Payer: Medicare Other | Admitting: Interventional Cardiology

## 2014-10-19 ENCOUNTER — Encounter: Payer: Self-pay | Admitting: Interventional Cardiology

## 2014-10-19 VITALS — BP 114/80 | HR 89 | Ht 67.0 in | Wt 252.0 lb

## 2014-10-19 DIAGNOSIS — Q245 Malformation of coronary vessels: Secondary | ICD-10-CM | POA: Diagnosis not present

## 2014-10-19 DIAGNOSIS — R0789 Other chest pain: Secondary | ICD-10-CM | POA: Diagnosis not present

## 2014-10-19 DIAGNOSIS — G4733 Obstructive sleep apnea (adult) (pediatric): Secondary | ICD-10-CM

## 2014-10-19 DIAGNOSIS — E785 Hyperlipidemia, unspecified: Secondary | ICD-10-CM

## 2014-10-19 NOTE — Progress Notes (Signed)
Patient ID: [name], female   DOB: 1946/11/08, 68 y.o.   MRN: [id number]    [address], [place]  [address] Phone: [phone] Fax:  [phone]  Date:  10/19/2014   ID:  [name], DOB 1946-06-26, MRN [id number]  PCP:  No primary care provider on file.   ASSESSMENT:  1. Chest tightness 2. Reflux with Laryngeal spasm 3. Obesity, morbid 4. 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 Untreated sleep apnea which was the likely source of the reflux that led to laryngeal spasm   PLAN:  1. Stress or pharmacologic myocardial perfusion study to rule out ischemia induced by anomalous RCA origin or development of obstructive coronary disease 2. Needs to see her primary care to consider GI consult 3. Needs to resume treatment for sleep apnea   SUBJECTIVE: [name] is a 68 y.o. female who is concerned that she may be developing heart problems. Her mother had coronary bypass surgery in her 48s. 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 Recently she has experienced tightness in the chest. It is not specifically nonexertional. Exertion has I have a cause dyspnea on exertion. She has shortness of breath when we,. She has known sleep apnea but is not currently being treated. She denies palpitations. She has had episodes of vertigo. She denies edema.   Wt Readings from Last 3 Encounters:  10/19/14 252 lb (114.306 kg)     Past Medical History  Diagnosis Date  . Sleep apnea     uses CPAP  . H/O hiatal hernia     surgery for hernia  . Headache(784.0)     migraines  . Arthritis     generalized  . Mental disorder   . 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 GERD (gastroesophageal reflux disease)   . Leg cramps   . Restless leg syndrome   . Vitamin D deficiency   . Osteoporosis   . Tubular adenoma of colon   . Gestational diabetes   .  Diverticulosis   . Hyperlipidemia   . Dyslipidemia   . Atrophic vaginitis   . DJD (degenerative joint disease)   . Abnormal heart rhythm   . Vertigo     Current Outpatient Prescriptions  Medication Sig Dispense Refill  . bisoprolol (ZEBETA) 10 MG tablet Take 10 mg by mouth 2 (two) times daily.    . DULERA 200-5 MCG/ACT AERO     . DULoxetine (CYMBALTA) 30 MG capsule Take 30 mg by mouth daily.    Marland Kitchen gabapentin (NEURONTIN) 300 MG capsule Take 300 mg by mouth at bedtime.    Marland Kitchen HYDROcodone-acetaminophen (NORCO) 10-325 MG per tablet     . lansoprazole (PREVACID) 30 MG capsule Take 30 mg by mouth 2 (two) times daily.    Marland Kitchen rOPINIRole (REQUIP) 1 MG tablet      No current facility-administered medications for this visit.    Allergies:   No Known Allergies  Social History:  The patient  reports that she has never smoked. She does not have any smokeless tobacco history on file. She reports that she drinks alcohol. She reports that she does not use illicit drugs.   ROS:  Please see the history of present illness.   No edema. No history of stroke. No episodes of syncope.   All other systems  reviewed and negative.   OBJECTIVE: VS:  BP 114/80 mmHg  Pulse 89  Ht 5\' 7"  (1.702 m)  Wt 252 lb (114.306 kg)  BMI 39.46 kg/m2 Well nourished, well developed, in no acute distress, morbidly obese HEENT: normal Neck: JVD flat. Carotid bruit absent  Cardiac:  normal S1, S2; RRR; no murmur Lungs:  clear to auscultation bilaterally, no wheezing, rhonchi or rales Abd: soft, nontender, no hepatomegaly Ext: Edema absent. Pulses trace to 1+ Skin: warm and dry Neuro:  CNs 2-12 intact, no focal abnormalities noted  EKG:  Normal sinus rhythm with poor R-wave progression       Signed, Illene Labrador III, MD 10/19/2014 3:19 PM

## 2014-10-19 NOTE — Patient Instructions (Signed)
Your physician recommends that you continue on your current medications as directed. Please refer to the Current Medication list given to you today.  Your physician has requested that you have en exercise stress myoview. For further information please visit [email or web address]. Please follow instruction sheet, as given.  Follow up pending results

## 2014-10-26 ENCOUNTER — Encounter (HOSPITAL_COMMUNITY): Payer: Medicare Other

## 2014-10-26 ENCOUNTER — Encounter (HOSPITAL_COMMUNITY): Payer: Medicare Other | Attending: Interventional Cardiology | Admitting: Radiology

## 2014-10-26 DIAGNOSIS — R0789 Other chest pain: Secondary | ICD-10-CM | POA: Diagnosis not present

## 2014-10-26 DIAGNOSIS — I2510 Atherosclerotic heart disease of native coronary artery without angina pectoris: Secondary | ICD-10-CM | POA: Diagnosis not present

## 2014-10-26 DIAGNOSIS — Q245 Malformation of coronary vessels: Secondary | ICD-10-CM | POA: Diagnosis not present

## 2014-10-26 MED ORDER — TECHNETIUM TC 99M SESTAMIBI GENERIC - CARDIOLITE
33.0000 | Freq: Once | INTRAVENOUS | Status: AC | PRN
  Administered 2014-10-26: 33 via INTRAVENOUS

## 2014-10-26 NOTE — Progress Notes (Signed)
[facility] [address] [place], [place] [address] [phone]    Cardiology Nuclear Med Study  [name] is a 68 y.o. female     MRN : [id number]     DOB: 1946-01-04  Procedure Date: 10/26/2014  Nuclear Med Background Indication for Stress Test:  Evaluation for Ischemia and Surgical Clearance-Knee Surgery -Dr. Melrose Nakayama, MD History:  Asthma  Cardiac Risk Factors: N/A  Symptoms:  Chest Pain, Chest Tightness and DOE   Nuclear Pre-Procedure Caffeine/Decaff Intake:  None NPO After: 8:00am   Lungs:  clear O2 Sat: 98% on room air. IV 0.9% NS with Angio Cath:  22g  IV Site: R Hand  IV Started by:  Crissie Figures, RN  Chest Size (in):  38 Cup Size: D  Height: 5\' 7"  (1.702 m)  Weight:  252 lb (114.306 kg)  BMI:  Body mass index is 39.46 kg/(m^2). Tech Comments:  n/a    Nuclear Med Study 1 or 2 day study: 2 day  Stress Test Type:  Stress  Reading MD: N/A  Order Authorizing Provider:  Daneen Schick, MD  Resting Radionuclide: Technetium 79m Sestamibi  Resting Radionuclide Dose: 33.0 mCi on 10/27/14  Stress Radionuclide:  Technetium 60m Sestamibi  Stress Radionuclide Dose: 33.0 mCi on 10/26/14          Stress Protocol Rest HR: 71 Stress HR: 137  Rest BP: 119/70 Stress BP: 206/81  Exercise Time (min): 4:00 METS: 5.80   Predicted Max HR: 153 bpm % Max HR: 89.54 bpm Rate Pressure Product: 28222   Dose of Adenosine (mg):  n/a Dose of Lexiscan: n/a mg  Dose of Atropine (mg): n/a Dose of Dobutamine: n/a mcg/kg/min (at max HR)  Stress Test Technologist: Perrin Maltese, EMT-P  Nuclear Technologist:  Earl Many, CNMT     Rest Procedure:  Myocardial perfusion imaging was performed at rest 45 minutes following the intravenous administration of Technetium 16m Sestamibi. Rest ECG: NSR 75 bpm  Stress Procedure:  The patient exercised on the treadmill utilizing the Bruce Protocol for 4:00 minutes. The patient stopped due to doe, fatigue, and  denied any chest pain.  Technetium 78m Sestamibi was injected at peak exercise and myocardial perfusion imaging was performed after a brief delay. Stress ECG: No significant change from baseline ECG  QPS Raw Data Images:  Soft tissue (diaphragm, breast) surround heart.   Stress Images: Normal perfusion with minimal apical thinning   Rest Images:  No significant change from the stress images   Subtraction (SDS):  No evidence of ischemia. Transient Ischemic Dilatation (Normal <1.22):  0.92 Lung/Heart Ratio (Normal <0.45):  0.30  Quantitative Gated Spect Images QGS EDV:  83 ml QGS ESV:  29 ml  Impression Exercise Capacity:  Poor exercise capacity. BP Response:  Hypertensive blood pressure response. Clinical Symptoms:  No chest pain. ECG Impression:  No significant ST segment change suggestive of ischemia. Comparison with Prior Nuclear Study: No prior nuclear study    Overall Impression:  Normal stress nuclear study.  LV Ejection Fraction: 65%.  LV Wall Motion:  NL LV Function; NL Wall Motion   [name]

## 2014-10-27 ENCOUNTER — Ambulatory Visit (HOSPITAL_COMMUNITY): Payer: Medicare Other | Attending: Interventional Cardiology | Admitting: Radiology

## 2014-10-27 DIAGNOSIS — R0989 Other specified symptoms and signs involving the circulatory and respiratory systems: Secondary | ICD-10-CM

## 2014-10-27 DIAGNOSIS — Q245 Malformation of coronary vessels: Secondary | ICD-10-CM

## 2014-10-27 MED ORDER — TECHNETIUM TC 99M SESTAMIBI GENERIC - CARDIOLITE
30.0000 | Freq: Once | INTRAVENOUS | Status: AC | PRN
  Administered 2014-10-27: 30 via INTRAVENOUS

## 2014-10-31 ENCOUNTER — Telehealth: Payer: Self-pay

## 2014-10-31 NOTE — Telephone Encounter (Signed)
-----   Message from [place], MD sent at 10/30/2014  2:16 PM EST ----- The myocardial perfusion study is normal. Chest symptoms are not related to coronary disease.

## 2014-10-31 NOTE — Telephone Encounter (Signed)
pt aware of myoview results.The myocardial perfusion study is normal. Chest symptoms are not related to coronary disease.pt  verbalized understanding.pt will f/u with pcp.

## 2014-11-01 NOTE — Telephone Encounter (Signed)
Follow-up is not necessary

## 2014-11-21 DIAGNOSIS — E559 Vitamin D deficiency, unspecified: Secondary | ICD-10-CM | POA: Diagnosis not present

## 2014-11-21 DIAGNOSIS — D509 Iron deficiency anemia, unspecified: Secondary | ICD-10-CM | POA: Diagnosis not present

## 2014-11-24 DIAGNOSIS — E559 Vitamin D deficiency, unspecified: Secondary | ICD-10-CM | POA: Diagnosis not present

## 2014-11-24 DIAGNOSIS — Z23 Encounter for immunization: Secondary | ICD-10-CM | POA: Diagnosis not present

## 2014-11-24 DIAGNOSIS — R0683 Snoring: Secondary | ICD-10-CM | POA: Diagnosis not present

## 2014-11-24 DIAGNOSIS — M79672 Pain in left foot: Secondary | ICD-10-CM | POA: Diagnosis not present

## 2014-11-29 DIAGNOSIS — M25775 Osteophyte, left foot: Secondary | ICD-10-CM | POA: Diagnosis not present

## 2014-11-29 DIAGNOSIS — M71572 Other bursitis, not elsewhere classified, left ankle and foot: Secondary | ICD-10-CM | POA: Diagnosis not present

## 2014-11-29 DIAGNOSIS — M25572 Pain in left ankle and joints of left foot: Secondary | ICD-10-CM | POA: Diagnosis not present

## 2014-11-29 DIAGNOSIS — M65872 Other synovitis and tenosynovitis, left ankle and foot: Secondary | ICD-10-CM | POA: Diagnosis not present

## 2014-11-29 DIAGNOSIS — M722 Plantar fascial fibromatosis: Secondary | ICD-10-CM | POA: Diagnosis not present

## 2014-12-06 DIAGNOSIS — M71572 Other bursitis, not elsewhere classified, left ankle and foot: Secondary | ICD-10-CM | POA: Diagnosis not present

## 2014-12-06 DIAGNOSIS — M25572 Pain in left ankle and joints of left foot: Secondary | ICD-10-CM | POA: Diagnosis not present

## 2014-12-06 DIAGNOSIS — M65872 Other synovitis and tenosynovitis, left ankle and foot: Secondary | ICD-10-CM | POA: Diagnosis not present

## 2014-12-14 DIAGNOSIS — L600 Ingrowing nail: Secondary | ICD-10-CM | POA: Diagnosis not present

## 2014-12-14 DIAGNOSIS — L03032 Cellulitis of left toe: Secondary | ICD-10-CM | POA: Diagnosis not present

## 2014-12-14 DIAGNOSIS — M65872 Other synovitis and tenosynovitis, left ankle and foot: Secondary | ICD-10-CM | POA: Diagnosis not present

## 2015-01-10 ENCOUNTER — Encounter: Payer: Self-pay | Admitting: Neurology

## 2015-01-10 ENCOUNTER — Ambulatory Visit (INDEPENDENT_AMBULATORY_CARE_PROVIDER_SITE_OTHER): Payer: Medicare Other | Admitting: Neurology

## 2015-01-10 VITALS — BP 140/74 | HR 85 | Ht 66.0 in | Wt 245.0 lb

## 2015-01-10 VITALS — BP 140/74 | HR 86 | Resp 16 | Ht 66.5 in | Wt 248.0 lb

## 2015-01-10 DIAGNOSIS — R790 Abnormal level of blood mineral: Secondary | ICD-10-CM | POA: Diagnosis not present

## 2015-01-10 DIAGNOSIS — L821 Other seborrheic keratosis: Secondary | ICD-10-CM | POA: Diagnosis not present

## 2015-01-10 DIAGNOSIS — Z808 Family history of malignant neoplasm of other organs or systems: Secondary | ICD-10-CM | POA: Diagnosis not present

## 2015-01-10 DIAGNOSIS — K909 Intestinal malabsorption, unspecified: Secondary | ICD-10-CM

## 2015-01-10 DIAGNOSIS — L720 Epidermal cyst: Secondary | ICD-10-CM | POA: Diagnosis not present

## 2015-01-10 DIAGNOSIS — G4733 Obstructive sleep apnea (adult) (pediatric): Secondary | ICD-10-CM | POA: Diagnosis not present

## 2015-01-10 DIAGNOSIS — D225 Melanocytic nevi of trunk: Secondary | ICD-10-CM | POA: Diagnosis not present

## 2015-01-10 DIAGNOSIS — Z9884 Bariatric surgery status: Secondary | ICD-10-CM | POA: Diagnosis not present

## 2015-01-10 DIAGNOSIS — D1800 Hemangioma unspecified site: Secondary | ICD-10-CM | POA: Diagnosis not present

## 2015-01-10 DIAGNOSIS — L309 Dermatitis, unspecified: Secondary | ICD-10-CM | POA: Diagnosis not present

## 2015-01-10 DIAGNOSIS — G4719 Other hypersomnia: Secondary | ICD-10-CM

## 2015-01-10 HISTORY — DX: Intestinal malabsorption, unspecified: K90.9

## 2015-01-10 MED ORDER — QUININE SULFATE 324 MG PO CAPS: ORAL_CAPSULE | ORAL | Status: DC

## 2015-01-10 MED ORDER — SUMATRIPTAN SUCCINATE 100 MG PO TABS: 100.0000 mg | ORAL_TABLET | Freq: Once | ORAL | Status: DC | PRN

## 2015-01-10 NOTE — Patient Instructions (Signed)

## 2015-01-10 NOTE — Addendum Note (Signed)
Addended by: Larey Seat on: 01/10/2015 10:00 AM   Modules accepted: Orders

## 2015-01-10 NOTE — Progress Notes (Signed)
[facility]   Provider:  Larey Seat, M D  Referring Provider: [name], MD Primary Care Physician:    Chief Complaint  Patient presents with  . NP [name]  Snoring/ Sleep Consult    Rm 11, alone    HPI:  [name] is a 69 y.o. female  Is seen here as a referral  from Dr. Maudie [name] for snoring and restless legs.  I have followed [name] from 2003 through 2008 in the past. The patient had undergone doing this. A gastric bypass surgery which led to an extensive loss of body weight. In the process she was no longer suffering from obstructive sleep apnea related to the weight loss, but she developed restless legs. Repeated iron studies had shown that she had extremely low ferritin levels. A ferritin level lower than 50 is associated with a much higher incidence of restless leg syndrome in addition her iron and iron binding capacities have been surprisingly normal and this conflict of data we have discussed today. Usually I would treat the patient with Ferrlecit and infusion once every 3 months or so to keep up ferritin levels in in her case I am not sure how much free iron is actually in her system. 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 In addition she is vitamin D deficient which are almost 80% of adult Americans. Gained some weight back but she has no longer nausea or dumping syndrome so therefore she does not need Phenergan or Zofran. She is using gabapentin and ropinirole to treat her restless legs she often wakes up with a headache and she suffers from at times severe crippling migraines. Leg cramps also interfere with her sleep the initiation of sleep as well as the sleep duration at night.  As her  BMI exceeds 35,  she may also have developed apnea again and snoring has been witnessed by her family. We are meeting today to reevaluate all these conditions in context. She is also excessively daytime sleepy for fatigue severity score is  endorsed at 63 points her Epworth sleepiness score was endorsed at 15 points.   Bedtime is usually 10 Pm and her husband leaves for work at 6.30, after that she will d go back to bed and sleep for 2-3 hours. Total daily sleep time is 14 hours, this is more likely related to depression.   Her daughter [name] and her mother have been extremely sleepy.   Review of Systems: Out of a complete 14 system review, the patient complains of only the following symptoms, and all other reviewed systems are negative. Restless legs she often wakes up with a headache and she suffers from at times severe crippling migraines. Leg cramps also interfere with her sleep the initiation of sleep as well as the sleep duration at night.  As her  BMI exceeds 35,  she may also have developed apnea again and snoring has been witnessed by her family. We are meeting today to reevaluate all these conditions in context. She is also excessively daytime sleepy for fatigue severity score is endorsed at 63 points her Epworth sleepiness score was endorsed at 15 points   Epworth score 15 , Fatigue severity score 63  , depression score 3. She cared for her father , who wa living in the house next door , and died at age 46.   History   Social History  . Marital Status: Married    Spouse Name: [name]  Number of Children: 3  . Years of Education: college   Occupational History  . Retired    Social History Main Topics  . Smoking status: Never Smoker   . Smokeless tobacco: Not on file  . Alcohol Use: 0.0 oz/week    0 Not specified per week     Comment: rare  . Drug Use: Not on file  . Sexual Activity: Not on file   Other Topics Concern  . Not on file   Social History Narrative   Caffeine 8-10 cups daily.  (coffee 1 cup am, unsw tea all day long).     Family History  Problem Relation Age of Onset  . CAD Mother   . Hypertension Mother   . Neuropathy Mother   . Osteoarthritis Mother   . Breast cancer  Maternal Grandmother   . CAD Paternal Grandfather     Past Medical History  Diagnosis Date  . Sleep apnea     uses CPAP  . H/O hiatal hernia     surgery for hernia  . Headache(784.0)     migraines  . Arthritis     generalized  . Mental disorder   . 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 GERD (gastroesophageal reflux disease)   . Leg cramps   . Restless leg syndrome   . Vitamin D deficiency   . Osteoporosis   . Tubular adenoma of colon   . Gestational diabetes   . Diverticulosis   . Hyperlipidemia   . Dyslipidemia   . Atrophic vaginitis   . DJD (degenerative joint disease)   . Abnormal heart rhythm   . Vertigo   . 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 Gastric bypass  2001  . Hiatal hernia repair    . Skin reduction  2003 2004  . Wisdom tooth extraction    . Excess skin removal      post weight loss  . Right knee surgery      Current Outpatient Prescriptions  Medication Sig Dispense Refill  . amphetamine-dextroamphetamine (ADDERALL) 10 MG tablet   0  . bisoprolol (ZEBETA) 10 MG tablet Take 10 mg by mouth 2 (two) times daily.    . cyclobenzaprine (FLEXERIL) 10 MG tablet   0  . DULERA 200-5 MCG/ACT AERO     . DULoxetine (CYMBALTA) 30 MG capsule Take 30 mg by mouth daily.    Marland Kitchen esomeprazole (NEXIUM) 40 MG capsule   6  . gabapentin (NEURONTIN) 300 MG capsule Take 300 mg by mouth at bedtime.    Marland Kitchen HYDROcodone-acetaminophen (NORCO) 10-325 MG per tablet     . lansoprazole (PREVACID) 30 MG capsule Take 30 mg by mouth 2 (two) times daily.    . NEOMYCIN-POLYMYXIN-HYDROCORTISONE (CORTISPORIN) 1 % SOLN otic solution   0  . rOPINIRole (REQUIP) 1 MG tablet     . zolpidem (AMBIEN) 10 MG tablet   4   No current facility-administered medications for this visit.    Allergies as of 01/10/2015  . (No Known Allergies)    Vitals: BP 140/74 mmHg  Pulse 86  Resp 16  Ht 5' 6.5" (1.689 m)  Wt 248 lb (112.492  kg)  BMI 39.43 kg/m2 Last Weight:  Wt Readings from Last 1 Encounters:  01/10/15 248 lb (112.492 kg)       Last Height:   Ht Readings from Last 1 Encounters:  01/10/15 5' 6.5" (1.689 m)  Physical exam:  General: The patient is awake, alert and appears not in acute distress. The patient is well groomed. Head: Normocephalic, atraumatic. Neck is supple. Mallampati 3 ,  neck circumference: 15 inches . Nasal airflow unrestricted,  TMJ is not  evident . Retrognathia is seen.  Cardiovascular:  Regular rate and rhythm , without  murmurs or carotid bruit, and without distended neck veins. Respiratory: Lungs are clear to auscultation. Skin:  Without evidence of edema, or rash Trunk: BMI is  elevated , but the  patient  has normal posture.  Neurologic exam : The patient is awake and alert, oriented to place and time.   Memory subjective described as intact.  There is a normal attention span & concentration ability. Speech is fluent without  dysarthria, dysphonia or aphasia. Mood and affect are appropriate.  Cranial nerves: Pupils are equal and briskly reactive to light. Funduscopic exam without   evidence of pallor or edema.  Extraocular movements  in vertical and horizontal planes intact and without nystagmus. Visual fields by finger perimetry are intact. Hearing to finger rub intact.  Facial sensation intact to fine touch. Facial motor strength is symmetric and tongue and uvula move midline.  Motor exam:   Normal tone, muscle bulk and symmetric strength in all extremities. No cog wheeling and no waxy or clasp knife response.   Sensory:  Fine touch, pinprick and vibration were tested in all extremities. Proprioception in both feet is affeced by pin and needle dysesthesias.   Slow healing wound from an ingrown toenail.   Coordination: Rapid alternating movements in the fingers/hands is normal. Finger-to-nose maneuver normal without evidence of ataxia, dysmetria or tremor.  Gait and  station: Patient walks without assistive device and is able unassisted to climb up to the exam table.  Strength within normal limits. Stance is stable and normal.  Deep tendon reflexes: in the  upper and lower extremities are symmetric and intact. Babinski maneuver downgoing.   Assessment:  After physical and neurologic examination, review of laboratory studies, imaging, neurophysiology testing and pre-existing records, assessment is   1)  RLS the patient has a history of severe obesity , morbid obesity - and lost a total of 250 pounds after gastric bypass. This surgery is time correlated to onset of RLS, found to be related to iron- ferritin reduction.  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  There is also a likely inherited component.  3) Snoring  Re-onset, will need a split with CO2 to see if hypercapnia leads to migraine.  4) migraine - visual aura, photophobia- the pain follows up to 1 hour later, black scotoma in her visual field.   The patient was advised of the nature of the diagnosed sleep disorder , the treatment options and risks for general a health and wellness arising from not treating the condition. Visit duration was 60 minutes.  Of today's new patient visit-consultation 50% of our face-to-face time are spent in discussion of the medical conditions found the differential diagnosis the testing and treatment options for the conditions. The patient is willing to undergo a new sleep study as she herself suspects she may have apnea again. Her cardiologists note's support this Pernell Dupre ,MD )   Plan:  Treatment plan and additional workup :  1)I would like to refer [name] to a hematologist and suggested Dr. Beryle Beams to evaluate her discrepancy between very low ferritin levels the shuttle protein for iron in the body and apparently sufficient iron  storage. She has maintained oral iron intake as well as multivitamins and multi minerals.  2)  as a elicited before I believe that there is a strong inherited component to [name]'s neuropathy. She is not diabetic she has no documented thyroid disease and neither did her mother nor has her daughter but all of these women half severe painful dysesthesias leg cramps and restless legs. 3) she has begun to snore again has gained some of her weight back after gastric bypass I will order a split night study with CO2.  4)Her headaches may be sleep related. She wakes up with migraines being present. She is unable to sleep off the headache. She does have a visual aura.      Asencion Partridge Tarren Sabree MD  01/10/2015

## 2015-01-11 NOTE — Sleep Study (Signed)
Please see the scanned sleep study interpretation located in the Procedure tab within the chart review section

## 2015-01-15 ENCOUNTER — Encounter: Payer: Self-pay | Admitting: Neurology

## 2015-01-23 ENCOUNTER — Other Ambulatory Visit: Payer: Self-pay | Admitting: Neurology

## 2015-01-23 ENCOUNTER — Telehealth: Payer: Self-pay | Admitting: *Deleted

## 2015-01-23 ENCOUNTER — Encounter: Payer: Self-pay | Admitting: Neurology

## 2015-01-23 ENCOUNTER — Encounter: Payer: Self-pay | Admitting: *Deleted

## 2015-01-23 DIAGNOSIS — G4719 Other hypersomnia: Secondary | ICD-10-CM | POA: Insufficient documentation

## 2015-01-23 DIAGNOSIS — G4733 Obstructive sleep apnea (adult) (pediatric): Secondary | ICD-10-CM

## 2015-01-23 NOTE — Telephone Encounter (Signed)
Patient was contacted and provided the results of her split night sleep study that revealed moderate to severe OSA.  Patient was informed of the results and instructed that CPAP therapy was effective in treatment.  Patient was referred to [facility] for CPAP set up.  Dr. [name] was faxed a copy of the report.  The patient gave verbal permission to mail a copy of her test results.   Patient instructed to contact our office 6-8 weeks post set up to schedule a follow up appointment.

## 2015-02-07 DIAGNOSIS — M1712 Unilateral primary osteoarthritis, left knee: Secondary | ICD-10-CM | POA: Diagnosis not present

## 2015-02-15 ENCOUNTER — Other Ambulatory Visit: Payer: Self-pay | Admitting: Orthopaedic Surgery

## 2015-02-19 ENCOUNTER — Ambulatory Visit: Payer: Medicare Other | Admitting: Cardiology

## 2015-02-26 ENCOUNTER — Other Ambulatory Visit: Payer: Self-pay

## 2015-02-26 DIAGNOSIS — Z1231 Encounter for screening mammogram for malignant neoplasm of breast: Secondary | ICD-10-CM

## 2015-02-27 DIAGNOSIS — M47816 Spondylosis without myelopathy or radiculopathy, lumbar region: Secondary | ICD-10-CM | POA: Diagnosis not present

## 2015-02-28 ENCOUNTER — Other Ambulatory Visit: Payer: Self-pay | Admitting: Oncology

## 2015-02-28 DIAGNOSIS — K909 Intestinal malabsorption, unspecified: Secondary | ICD-10-CM

## 2015-02-28 DIAGNOSIS — Z9884 Bariatric surgery status: Secondary | ICD-10-CM

## 2015-03-01 ENCOUNTER — Other Ambulatory Visit (INDEPENDENT_AMBULATORY_CARE_PROVIDER_SITE_OTHER): Payer: Medicare Other

## 2015-03-01 ENCOUNTER — Ambulatory Visit: Payer: Medicare Other

## 2015-03-01 DIAGNOSIS — K909 Intestinal malabsorption, unspecified: Secondary | ICD-10-CM

## 2015-03-01 DIAGNOSIS — R1310 Dysphagia, unspecified: Secondary | ICD-10-CM | POA: Diagnosis not present

## 2015-03-01 DIAGNOSIS — Z9884 Bariatric surgery status: Secondary | ICD-10-CM

## 2015-03-01 DIAGNOSIS — K219 Gastro-esophageal reflux disease without esophagitis: Secondary | ICD-10-CM | POA: Diagnosis not present

## 2015-03-01 DIAGNOSIS — Z800 Family history of malignant neoplasm of digestive organs: Secondary | ICD-10-CM | POA: Diagnosis not present

## 2015-03-01 DIAGNOSIS — K921 Melena: Secondary | ICD-10-CM | POA: Diagnosis not present

## 2015-03-01 LAB — CBC WITH DIFFERENTIAL/PLATELET
Basophils Absolute: 0 10*3/uL (ref 0.0–0.1)
Basophils Relative: 0 % (ref 0–1)
EOS PCT: 0 % (ref 0–5)
Eosinophils Absolute: 0 10*3/uL (ref 0.0–0.7)
HCT: 35.5 % — ABNORMAL LOW (ref 36.0–46.0)
HEMOGLOBIN: 11 g/dL — AB (ref 12.0–15.0)
LYMPHS ABS: 1.1 10*3/uL (ref 0.7–4.0)
Lymphocytes Relative: 10 % — ABNORMAL LOW (ref 12–46)
MCH: 26.4 pg (ref 26.0–34.0)
MCHC: 31 g/dL (ref 30.0–36.0)
MCV: 85.3 fL (ref 78.0–100.0)
MONO ABS: 0.5 10*3/uL (ref 0.1–1.0)
MONOS PCT: 4 % (ref 3–12)
MPV: 10.8 fL (ref 8.6–12.4)
Neutro Abs: 9.7 10*3/uL — ABNORMAL HIGH (ref 1.7–7.7)
Neutrophils Relative %: 86 % — ABNORMAL HIGH (ref 43–77)
Platelets: 305 10*3/uL (ref 150–400)
RBC: 4.16 MIL/uL (ref 3.87–5.11)
RDW: 16 % — ABNORMAL HIGH (ref 11.5–15.5)
WBC: 11.3 10*3/uL — AB (ref 4.0–10.5)

## 2015-03-01 LAB — FERRITIN: Ferritin: 8 ng/mL — ABNORMAL LOW (ref 10–291)

## 2015-03-01 LAB — IRON AND TIBC
%SAT: 8 % — ABNORMAL LOW (ref 20–55)
IRON: 37 ug/dL — AB (ref 42–145)
TIBC: 457 ug/dL (ref 250–470)
UIBC: 420 ug/dL — AB (ref 125–400)

## 2015-03-01 LAB — FOLATE: FOLATE: 3.8 ng/mL

## 2015-03-01 LAB — VITAMIN B12: VITAMIN B 12: 302 pg/mL (ref 211–911)

## 2015-03-02 ENCOUNTER — Other Ambulatory Visit (HOSPITAL_COMMUNITY)
Admission: RE | Admit: 2015-03-02 | Discharge: 2015-03-02 | Disposition: A | Discharge status: Home / Self Care | Payer: Medicare Other | Source: Ambulatory Visit | Attending: Obstetrics and Gynecology | Admitting: Obstetrics and Gynecology

## 2015-03-02 ENCOUNTER — Other Ambulatory Visit: Payer: Self-pay | Admitting: Obstetrics and Gynecology

## 2015-03-02 ENCOUNTER — Encounter: Payer: Self-pay | Admitting: Neurology

## 2015-03-02 DIAGNOSIS — Z01419 Encounter for gynecological examination (general) (routine) without abnormal findings: Secondary | ICD-10-CM | POA: Insufficient documentation

## 2015-03-02 DIAGNOSIS — Z1151 Encounter for screening for human papillomavirus (HPV): Secondary | ICD-10-CM | POA: Insufficient documentation

## 2015-03-02 DIAGNOSIS — M810 Age-related osteoporosis without current pathological fracture: Secondary | ICD-10-CM | POA: Diagnosis not present

## 2015-03-05 DIAGNOSIS — M1811 Unilateral primary osteoarthritis of first carpometacarpal joint, right hand: Secondary | ICD-10-CM | POA: Diagnosis not present

## 2015-03-06 ENCOUNTER — Other Ambulatory Visit: Payer: Self-pay | Admitting: Oncology

## 2015-03-06 DIAGNOSIS — Z9884 Bariatric surgery status: Secondary | ICD-10-CM

## 2015-03-06 DIAGNOSIS — K909 Intestinal malabsorption, unspecified: Secondary | ICD-10-CM

## 2015-03-06 LAB — CYTOLOGY - PAP

## 2015-03-08 DIAGNOSIS — M810 Age-related osteoporosis without current pathological fracture: Secondary | ICD-10-CM | POA: Diagnosis not present

## 2015-03-09 ENCOUNTER — Ambulatory Visit: Payer: Medicare Other

## 2015-03-09 ENCOUNTER — Ambulatory Visit: Payer: PRIVATE HEALTH INSURANCE

## 2015-03-13 ENCOUNTER — Ambulatory Visit
Admission: RE | Admit: 2015-03-13 | Discharge: 2015-03-13 | Disposition: A | Discharge status: Home / Self Care | Payer: Medicare Other | Source: Ambulatory Visit

## 2015-03-13 DIAGNOSIS — Z1231 Encounter for screening mammogram for malignant neoplasm of breast: Secondary | ICD-10-CM

## 2015-03-19 ENCOUNTER — Encounter (HOSPITAL_COMMUNITY): Payer: Self-pay

## 2015-03-19 ENCOUNTER — Encounter (HOSPITAL_COMMUNITY)
Admission: RE | Admit: 2015-03-19 | Discharge: 2015-03-19 | Disposition: A | Discharge status: Home / Self Care | Payer: Medicare Other | Source: Ambulatory Visit | Attending: Oncology | Admitting: Oncology

## 2015-03-19 VITALS — BP 126/63 | HR 74 | Temp 98.3°F | Resp 17 | Ht 66.0 in | Wt 249.0 lb

## 2015-03-19 DIAGNOSIS — Z9884 Bariatric surgery status: Secondary | ICD-10-CM | POA: Insufficient documentation

## 2015-03-19 DIAGNOSIS — K909 Intestinal malabsorption, unspecified: Secondary | ICD-10-CM | POA: Insufficient documentation

## 2015-03-19 MED ORDER — SODIUM CHLORIDE 0.9 % IV SOLN
510.0000 mg | INTRAVENOUS | Status: DC
  Administered 2015-03-19: 510 mg via INTRAVENOUS
  Filled 2015-03-19: qty 17

## 2015-03-19 MED ORDER — SODIUM CHLORIDE 0.9 % IV SOLN
INTRAVENOUS | Status: DC
  Administered 2015-03-19: 15:00:00 via INTRAVENOUS

## 2015-03-19 NOTE — Discharge Instructions (Signed)

## 2015-03-22 ENCOUNTER — Encounter (HOSPITAL_COMMUNITY): Payer: Self-pay

## 2015-03-22 ENCOUNTER — Ambulatory Visit (HOSPITAL_COMMUNITY)
Admission: RE | Admit: 2015-03-22 | Discharge: 2015-03-22 | Disposition: A | Discharge status: Home / Self Care | Payer: Medicare Other | Source: Ambulatory Visit | Attending: Orthopaedic Surgery | Admitting: Orthopaedic Surgery

## 2015-03-22 ENCOUNTER — Encounter (HOSPITAL_COMMUNITY)
Admission: RE | Admit: 2015-03-22 | Discharge: 2015-03-22 | Disposition: A | Discharge status: Home / Self Care | Payer: Medicare Other | Source: Ambulatory Visit | Attending: Orthopaedic Surgery | Admitting: Orthopaedic Surgery

## 2015-03-22 DIAGNOSIS — E785 Hyperlipidemia, unspecified: Secondary | ICD-10-CM | POA: Diagnosis not present

## 2015-03-22 DIAGNOSIS — J45909 Unspecified asthma, uncomplicated: Secondary | ICD-10-CM | POA: Insufficient documentation

## 2015-03-22 DIAGNOSIS — Q245 Malformation of coronary vessels: Secondary | ICD-10-CM | POA: Diagnosis not present

## 2015-03-22 DIAGNOSIS — G4733 Obstructive sleep apnea (adult) (pediatric): Secondary | ICD-10-CM | POA: Diagnosis not present

## 2015-03-22 DIAGNOSIS — Z01818 Encounter for other preprocedural examination: Secondary | ICD-10-CM

## 2015-03-22 DIAGNOSIS — G4730 Sleep apnea, unspecified: Secondary | ICD-10-CM | POA: Diagnosis not present

## 2015-03-22 DIAGNOSIS — Z01812 Encounter for preprocedural laboratory examination: Secondary | ICD-10-CM | POA: Insufficient documentation

## 2015-03-22 HISTORY — DX: Pneumonia, unspecified organism: J18.9

## 2015-03-22 HISTORY — DX: Major depressive disorder, single episode, unspecified: F32.9

## 2015-03-22 HISTORY — DX: Dorsalgia, unspecified: M54.9

## 2015-03-22 HISTORY — DX: Personal history of other infectious and parasitic diseases: Z86.19

## 2015-03-22 HISTORY — DX: Family history of other diseases of the digestive system: Z83.79

## 2015-03-22 HISTORY — DX: Pain in unspecified joint: M25.50

## 2015-03-22 HISTORY — DX: Unspecified asthma, uncomplicated: J45.909

## 2015-03-22 HISTORY — DX: Cardiac arrhythmia, unspecified: I49.9

## 2015-03-22 HISTORY — DX: Depression, unspecified: F32.A

## 2015-03-22 HISTORY — DX: Effusion, unspecified joint: M25.40

## 2015-03-22 HISTORY — DX: Insomnia, unspecified: G47.00

## 2015-03-22 HISTORY — DX: Nocturia: R35.1

## 2015-03-22 HISTORY — DX: Personal history of other diseases of the respiratory system: Z87.09

## 2015-03-22 HISTORY — DX: Polyneuropathy, unspecified: G62.9

## 2015-03-22 LAB — BASIC METABOLIC PANEL
Anion gap: 6 (ref 5–15)
BUN: 12 mg/dL (ref 6–23)
CO2: 25 mmol/L (ref 19–32)
Calcium: 8.8 mg/dL (ref 8.4–10.5)
Chloride: 108 mmol/L (ref 96–112)
Creatinine, Ser: 1.04 mg/dL (ref 0.50–1.10)
GFR, EST AFRICAN AMERICAN: 63 mL/min — AB (ref >90)
GFR, EST NON AFRICAN AMERICAN: 54 mL/min — AB (ref >90)
Glucose, Bld: 104 mg/dL — ABNORMAL HIGH (ref 70–99)
Potassium: 4.3 mmol/L (ref 3.5–5.1)
Sodium: 139 mmol/L (ref 135–145)

## 2015-03-22 LAB — TYPE AND SCREEN
ABO/RH(D): A NEG
Antibody Screen: NEGATIVE

## 2015-03-22 LAB — CBC WITH DIFFERENTIAL/PLATELET
BASOS ABS: 0.1 10*3/uL (ref 0.0–0.1)
Basophils Relative: 1 % (ref 0–1)
Eosinophils Absolute: 0.2 10*3/uL (ref 0.0–0.7)
Eosinophils Relative: 3 % (ref 0–5)
HCT: 37.3 % (ref 36.0–46.0)
HEMOGLOBIN: 11.4 g/dL — AB (ref 12.0–15.0)
LYMPHS ABS: 1.4 10*3/uL (ref 0.7–4.0)
LYMPHS PCT: 23 % (ref 12–46)
MCH: 27.1 pg (ref 26.0–34.0)
MCHC: 30.6 g/dL (ref 30.0–36.0)
MCV: 88.8 fL (ref 78.0–100.0)
MONO ABS: 0.4 10*3/uL (ref 0.1–1.0)
MONOS PCT: 7 % (ref 3–12)
Neutro Abs: 4.1 10*3/uL (ref 1.7–7.7)
Neutrophils Relative %: 66 % (ref 43–77)
Platelets: 297 10*3/uL (ref 150–400)
RBC: 4.2 MIL/uL (ref 3.87–5.11)
RDW: 15.5 % (ref 11.5–15.5)
WBC: 6.1 10*3/uL (ref 4.0–10.5)

## 2015-03-22 LAB — URINALYSIS, ROUTINE W REFLEX MICROSCOPIC
BILIRUBIN URINE: NEGATIVE
Glucose, UA: NEGATIVE mg/dL
Hgb urine dipstick: NEGATIVE
KETONES UR: NEGATIVE mg/dL
Leukocytes, UA: NEGATIVE
NITRITE: NEGATIVE
PH: 5 (ref 5.0–8.0)
PROTEIN: NEGATIVE mg/dL
Specific Gravity, Urine: 1.019 (ref 1.005–1.030)
Urobilinogen, UA: 0.2 mg/dL (ref 0.0–1.0)

## 2015-03-22 LAB — PROTIME-INR
INR: 0.96 (ref 0.00–1.49)
Prothrombin Time: 12.9 seconds (ref 11.6–15.2)

## 2015-03-22 LAB — APTT: aPTT: 24 seconds (ref 24–37)

## 2015-03-22 LAB — ABO/RH: ABO/RH(D): A NEG

## 2015-03-22 LAB — SURGICAL PCR SCREEN
MRSA, PCR: NEGATIVE
STAPHYLOCOCCUS AUREUS: NEGATIVE

## 2015-03-22 IMAGING — CR DG CHEST 2V
2 series · 2 of 2 positions shown · non-contrast
Comparison: Report of a chest x-ray dated [DATE]

CLINICAL DATA: Preoperative examination prior to knee replacement;
history of asthma and sleep apnea

EXAM:
CHEST  2 VIEW

[w chest pa]
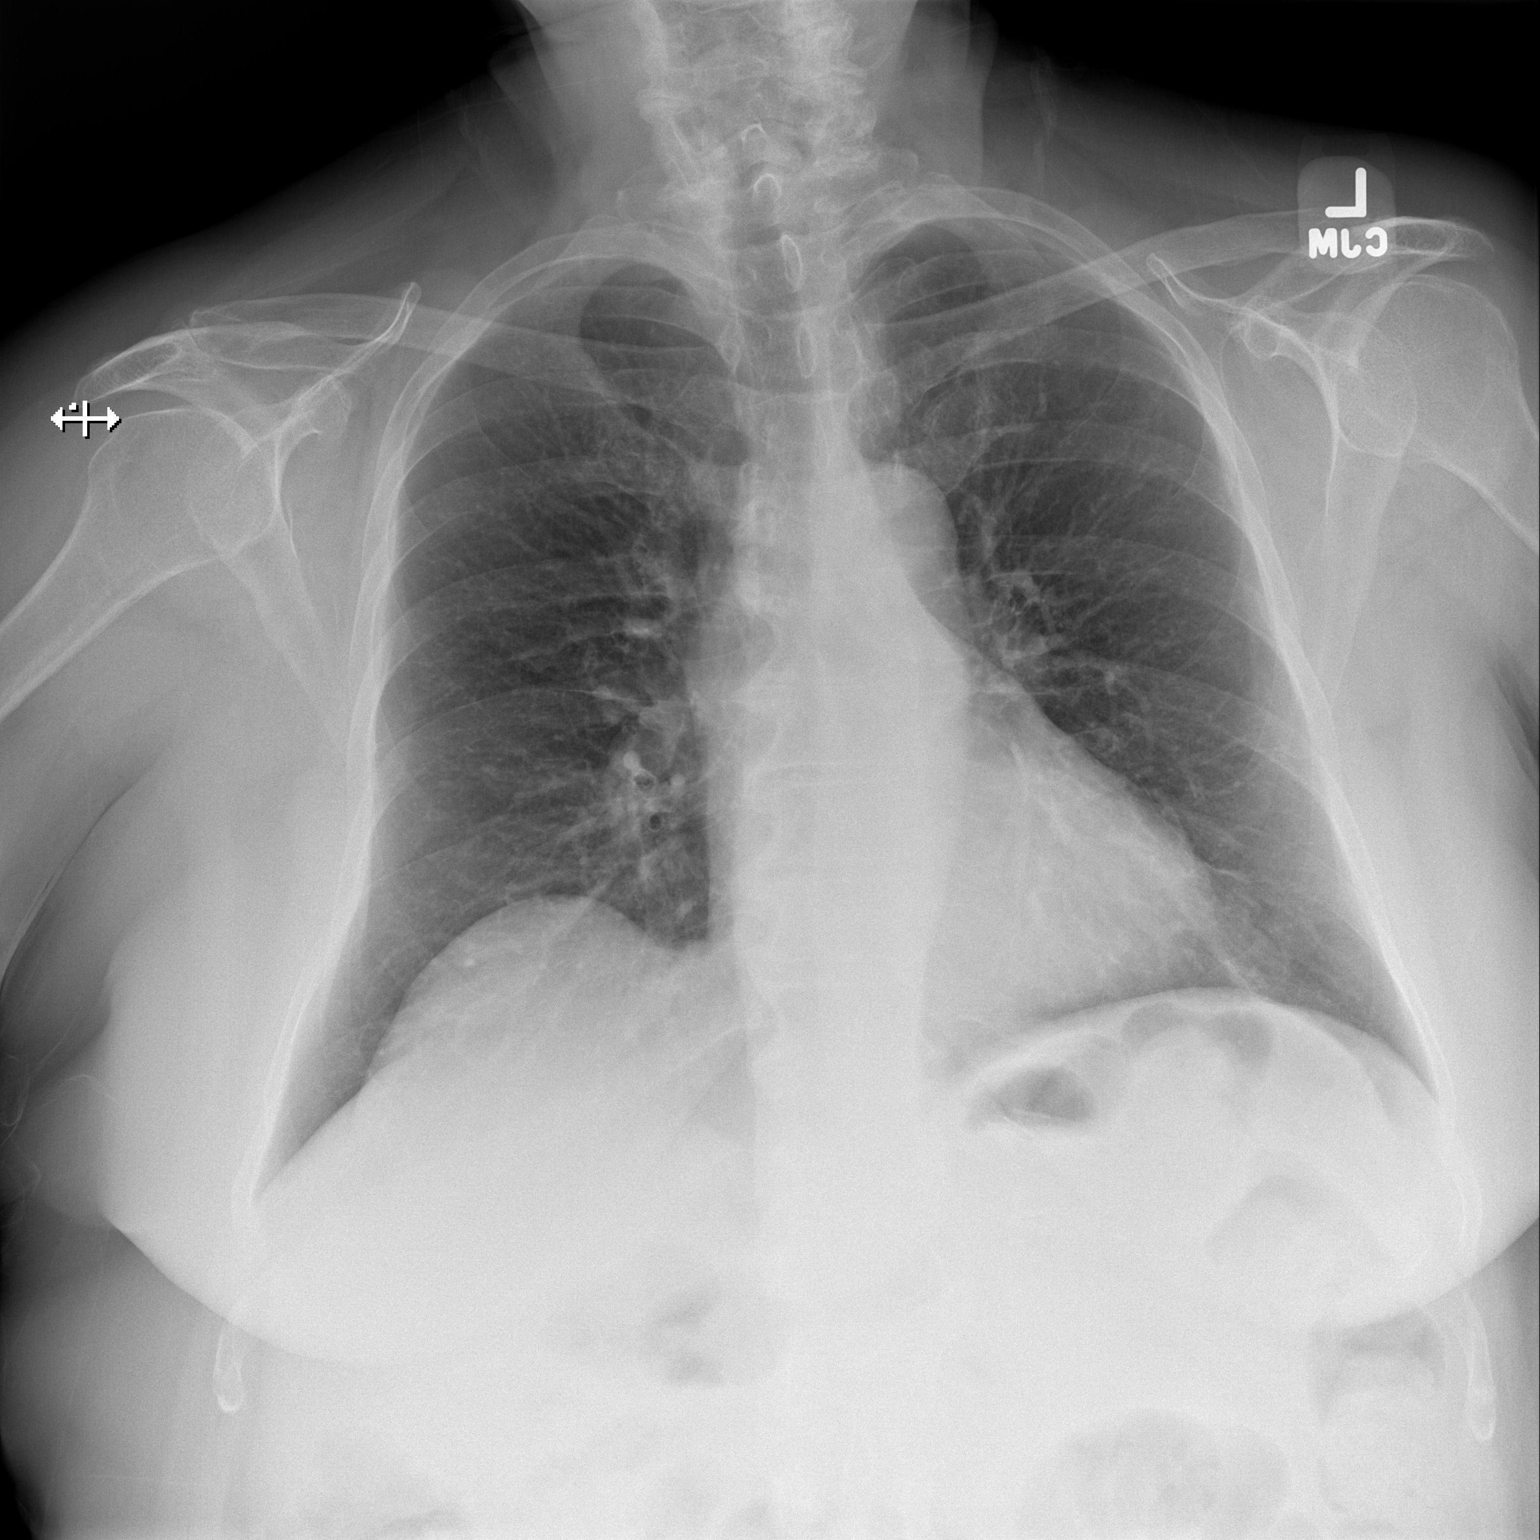

[w chest lat]
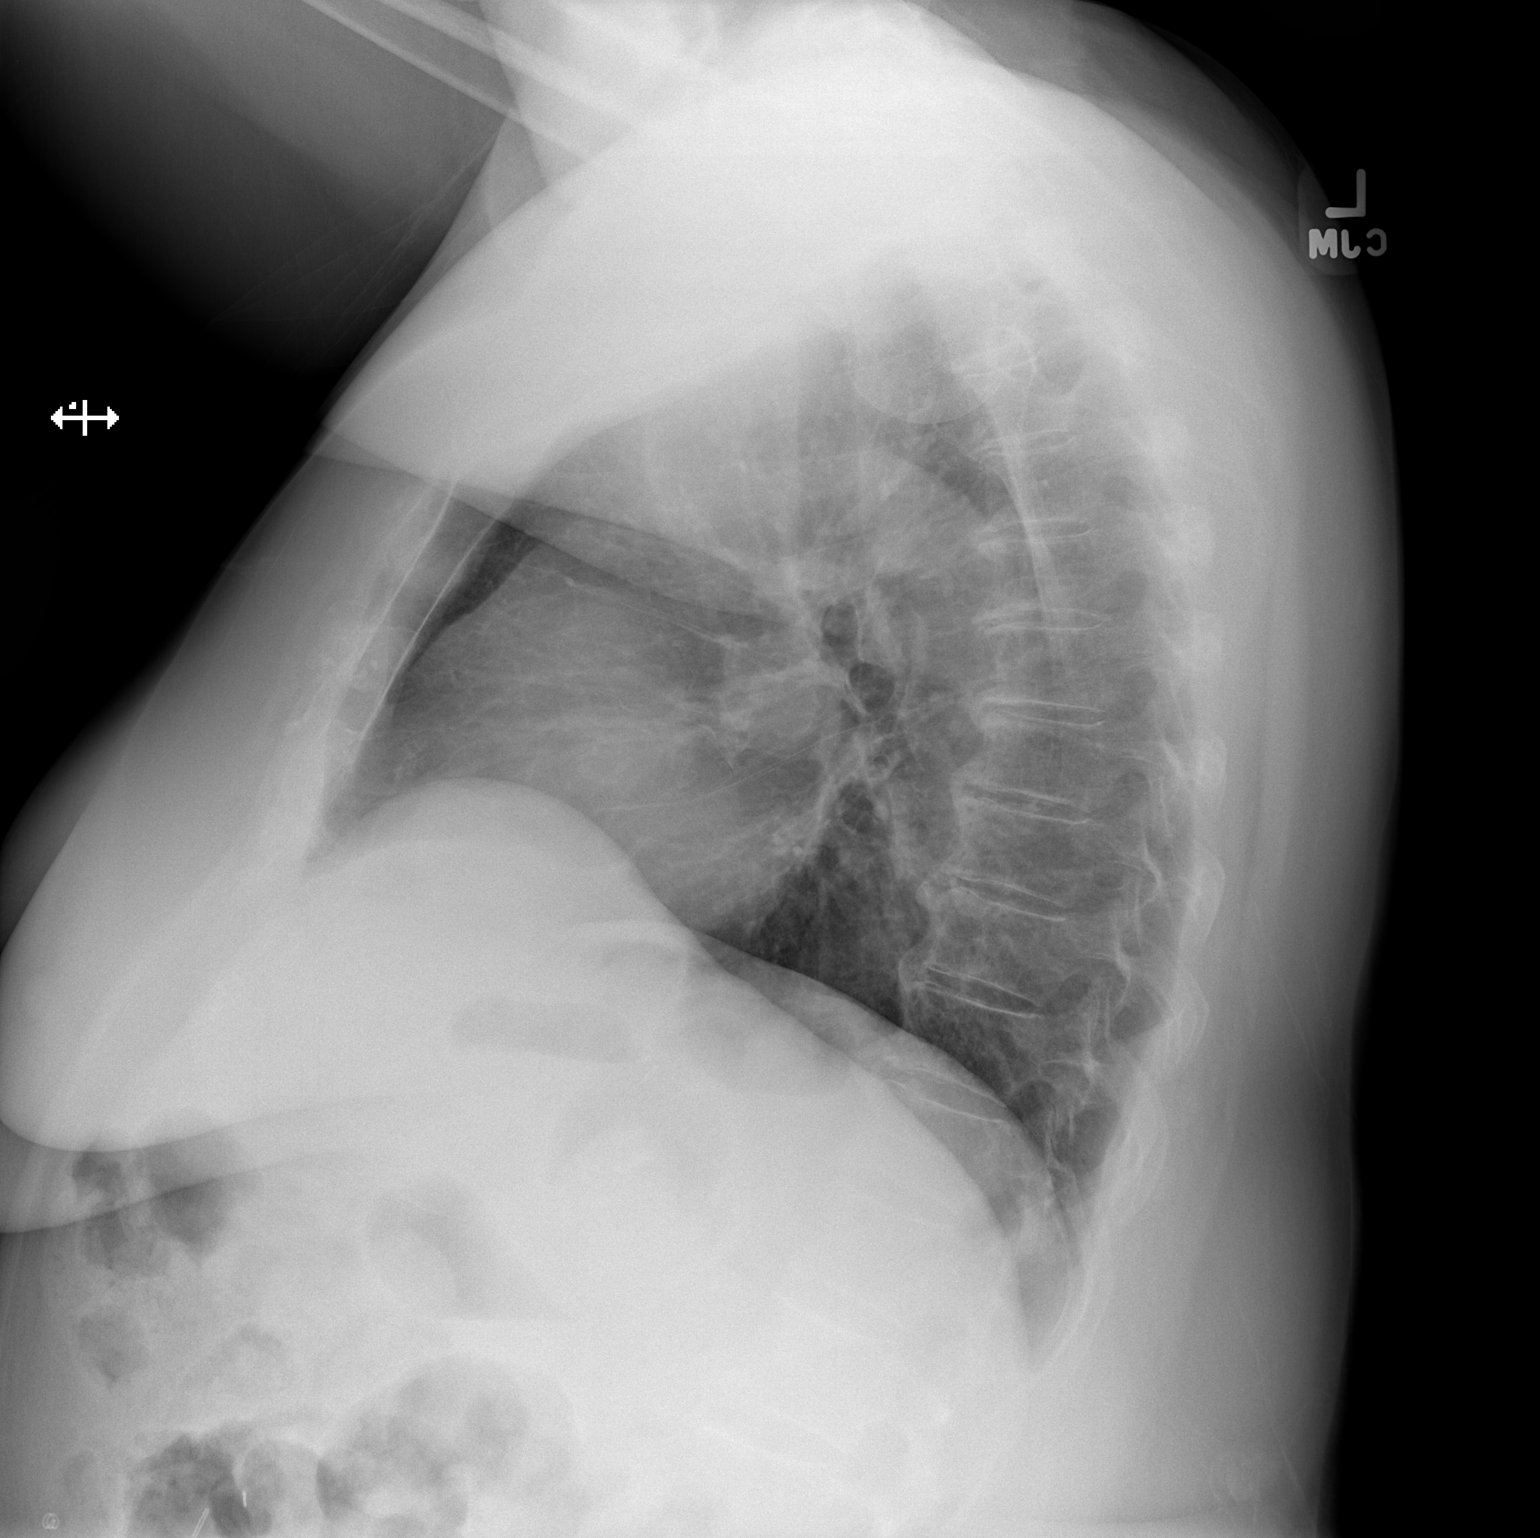

[2 of 2 positions shown; findings below may reference images not displayed]

FINDINGS: The lungs are adequately inflated. There is no focal infiltrate.
There is no pleural effusion. The heart and pulmonary vascularity
are normal. The mediastinum is normal in width. There is mild
degenerative disc space narrowing at multiple thoracic levels with
calcification of the anterior longitudinal ligament.
IMPRESSION: There is no active cardiopulmonary disease.

## 2015-03-22 MED ORDER — CHLORHEXIDINE GLUCONATE 4 % EX LIQD: 60.0000 mL | Freq: Once | CUTANEOUS | Status: DC

## 2015-03-22 NOTE — Progress Notes (Addendum)
Stress test in epic from 2015  EKG in epic from 10-19-14  Sleep study in epic from 2016  Medical Md is Dr.James Maudie Mercury  Cardiologist is Dr.Henry Tamala Julian with last visit in epic from Nov 2015  Echo/heart [id number])  Denies having a CXR in past yr

## 2015-03-22 NOTE — Pre-Procedure Instructions (Signed)
[name]  03/22/2015   Your procedure is scheduled on:   Tues, April 19 @ 10:05 AM  Report to [facility]  at 8:00 AM.  Call this number if you have problems the morning of surgery: [phone]   Remember:   Do not eat food or drink liquids after midnight.   Take these medicines the morning of surgery with A SIP OF WATER: Adderall(Amphetamine-Dextroamphetamine),Bisoprolol(Zebeta),Dulera<Bring Your Inhaler With You>,Cymbalta(Duloxetine),Nexium(Esomeprazole),Flonase(Fluticasone),Gabapentin(Neurontin),and Pain Pill(if needed)               No Goody's,BC's,Aleve,Aspirin,Ibuprofen,Fish Oil,or any Herbal Medications.    Do not wear jewelry, make-up or nail polish.  Do not wear lotions, powders, or perfumes. You may wear deodorant.  Do not shave 48 hours prior to surgery.   Do not bring valuables to the hospital.  [facility] is not responsible                  for any belongings or valuables.               Contacts, dentures or bridgework may not be worn into surgery.  Leave suitcase in the car. After surgery it may be brought to your room.  For patients admitted to the hospital, discharge time is determined by your                treatment team.                   Special Instructions:  [facility] - Preparing for Surgery  Before surgery, you can play an important role.  Because skin is not sterile, your skin needs to be as free of germs as possible.  You can reduce the number of germs on you skin by washing with CHG (chlorahexidine gluconate) soap before surgery.  CHG is an antiseptic cleaner which kills germs and bonds with the skin to continue killing germs even after washing.  Please DO NOT use if you have an allergy to CHG or antibacterial soaps.  If your skin becomes reddened/irritated stop using the CHG and inform your nurse when you arrive at Short Stay.  Do not shave (including legs and underarms) for at least 48 hours prior to the first CHG shower.  You may shave your  face.  Please follow these instructions carefully:   1.  Shower with CHG Soap the night before surgery and the                                morning of Surgery.  2.  If you choose to wash your hair, wash your hair first as usual with your       normal shampoo.  3.  After you shampoo, rinse your hair and body thoroughly to remove the                      Shampoo.  4.  Use CHG as you would any other liquid soap.  You can apply chg directly       to the skin and wash gently with scrungie or a clean washcloth.  5.  Apply the CHG Soap to your body ONLY FROM THE NECK DOWN.        Do not use on open wounds or open sores.  Avoid contact with your eyes,       ears, mouth and genitals (private parts).  Wash genitals (private parts)  with your normal soap.  6.  Wash thoroughly, paying special attention to the area where your surgery        will be performed.  7.  Thoroughly rinse your body with warm water from the neck down.  8.  DO NOT shower/wash with your normal soap after using and rinsing off       the CHG Soap.  9.  Pat yourself dry with a clean towel.            10.  Wear clean pajamas.            11.  Place clean sheets on your bed the night of your first shower and do not        sleep with pets.  Day of Surgery  Do not apply any lotions/deoderants the morning of surgery.  Please wear clean clothes to the hospital/surgery center.     Please read over the following fact sheets that you were given: Pain Booklet, Coughing and Deep Breathing, Blood Transfusion Information, MRSA Information and Surgical Site Infection Prevention

## 2015-03-23 NOTE — Progress Notes (Signed)
Anesthesia Chart Review:  Pt is 69 year old female scheduled for L total knee arthroplasty on 04/03/2015 with Dr. Rhona Raider.   Cardiologist is Dr. Daneen Schick.   PMH includes: Dysrhythmia, OSA, dyslipidemia, anemia. Never smoker. BMI 40.   Preoperative labs reviewed.    Chest x-ray reviewed. No active cardiopulmonary disease.   EKG 10/19/2014: NSR with poor R wave progression as interpreted by Dr. Tamala Julian.   Nuclear stress test 10/30/2014: Normal stress nuclear study. LV Ejection Fraction: 65%. LV Wall Motion: NL LV Function; NL Wall Motion   Cardiac cath 04/17/2009: 1. 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 2. Normal left coronary system including the left main. 3. Small left sinus of Valsalva. 4. Normal left ventricular function.  PLAN: Consider stress Cardiolite study to rule out evidence of inferior wall ischemia and/or coronary CTA to demonstrate the course of theanomalous RCA. My impression is that this is a relatively safe anomalyfor this patient the right coronary territory is relatively small. Theanomalous RCA on the left sinus of Valsalva is almost 100%intrapulmonary artery aortic root in its course. If we can demonstrateischemia or high-grade slit-like obstruction by either Cardiolite or coronary CTA no further management issues will be undertaken. The patient should be on beta-blockers.    If no changes, I anticipate pt can proceed with surgery as scheduled.   Willeen Cass, FNP-BC [facility]/Anesthesiology Phone: [phone] 03/23/2015 2:11 PM

## 2015-03-26 ENCOUNTER — Encounter (HOSPITAL_COMMUNITY)
Admission: RE | Admit: 2015-03-26 | Discharge: 2015-03-26 | Disposition: A | Discharge status: Home / Self Care | Payer: Medicare Other | Source: Ambulatory Visit | Attending: Oncology | Admitting: Oncology

## 2015-03-26 ENCOUNTER — Encounter (HOSPITAL_COMMUNITY): Payer: Self-pay

## 2015-03-26 VITALS — BP 132/62 | HR 72 | Temp 98.6°F | Resp 18

## 2015-03-26 DIAGNOSIS — Z9884 Bariatric surgery status: Secondary | ICD-10-CM

## 2015-03-26 DIAGNOSIS — M25562 Pain in left knee: Secondary | ICD-10-CM | POA: Diagnosis not present

## 2015-03-26 DIAGNOSIS — K909 Intestinal malabsorption, unspecified: Secondary | ICD-10-CM | POA: Diagnosis not present

## 2015-03-26 DIAGNOSIS — R262 Difficulty in walking, not elsewhere classified: Secondary | ICD-10-CM | POA: Diagnosis not present

## 2015-03-26 MED ORDER — SODIUM CHLORIDE 0.9 % IV SOLN
INTRAVENOUS | Status: AC
  Administered 2015-03-26: 14:00:00 via INTRAVENOUS

## 2015-03-26 MED ORDER — SODIUM CHLORIDE 0.9 % IV SOLN
510.0000 mg | INTRAVENOUS | Status: AC
  Administered 2015-03-26: 510 mg via INTRAVENOUS
  Filled 2015-03-26: qty 17

## 2015-03-28 NOTE — H&P (Signed)
TOTAL KNEE ADMISSION H&P  Patient is being admitted for left total knee arthroplasty.  Subjective:  Chief Complaint:left knee pain.  HPI: [name], 69 y.o. female, has a history of pain and functional disability in the left knee due to arthritis and has failed non-surgical conservative treatments for greater than 12 weeks to includeNSAID's and/or analgesics, corticosteriod injections, viscosupplementation injections, flexibility and strengthening excercises, supervised PT with diminished ADL's post treatment, weight reduction as appropriate and activity modification.  Onset of symptoms was gradual, starting 5 years ago with gradually worsening course since that time. The patient noted prior procedures on the knee to include  arthroscopy on the left knee(s).  Patient currently rates pain in the left knee(s) at 10 out of 10 with activity. Patient has night pain, worsening of pain with activity and weight bearing, pain that interferes with activities of daily living, crepitus and joint swelling.  Patient has evidence of subchondral cysts, subchondral sclerosis, periarticular osteophytes and joint space narrowing by imaging studies.  There is no active infection.  Patient Active Problem List   Diagnosis Date Noted  . OSA (obstructive sleep apnea) 01/23/2015  . Excessive daytime sleepiness 01/23/2015  . 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 S/P gastric bypass 01/10/2015  . Obstructive sleep apnea 10/19/2014  . 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 Hyperlipidemia 10/19/2014  . Chest discomfort 10/19/2014  . Morbid obesity 10/19/2014   Past Medical History  Diagnosis Date  . Sleep apnea     uses CPAP  . H/O hiatal hernia     surgery for hernia  . Arthritis     generalized  . Leg cramps     takes Flexeril daily as needed  . Restless leg syndrome     takes Requip daily  . Vitamin D deficiency   . Osteoporosis   . Tubular adenoma of colon   . Gestational diabetes   .  Diverticulosis     benign  . Dyslipidemia   . Atrophic vaginitis   . DJD (degenerative joint disease)   . Vertigo   . 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 Depression     takes Cymbalta daily  . Insomnia     takes Ambien nightly  . GERD (gastroesophageal reflux disease)     takes Nexium daily  . Dysrhythmia   . Asthma   . Pneumonia 69yrs ago    hx of  . History of bronchitis 1 yr ago  . Headache(784.0)     takes Imitrex daily as needed and Bisoprolol daily;last migraine was about 2wks ago  . Peripheral neuropathy     takes Gabapentin daily  . Joint pain   . Joint swelling   . Back pain     DDD/stenosis  . Family history of GI bleeding   . Nocturia   . 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 History of shingles     Past Surgical History  Procedure Laterality Date  . Gastric bypass  2001  . Skin reduction  2003 2004    stomach and arm  . Wisdom tooth extraction    . Excess skin removal      post weight loss  . Hernia repair      umbilical  . Colonoscopy    . Esophagogastroduodenoscopy    . Joint replacement Right     knee  . Steroid injection to scar      x 2 in back  No prescriptions prior to admission   No Known Allergies  History  Substance Use Topics  . Smoking status: Never Smoker   . Smokeless tobacco: Not on file  . Alcohol Use: No    Family History  Problem Relation Age of Onset  . CAD Mother   . Hypertension Mother   . Neuropathy Mother   . Osteoarthritis Mother   . Heart disease Mother   . Breast cancer Maternal Grandmother   . CAD Paternal Grandfather   . Colon cancer Father      Review of Systems  Musculoskeletal: Positive for joint pain.       Left knee  All other systems reviewed and are negative.   Objective:  Physical Exam  Constitutional: She is oriented to person, place, and time. She appears well-developed and well-nourished.  HENT:  Head: Normocephalic and atraumatic.  Eyes: Pupils are equal, round,  and reactive to light.  Neck: Normal range of motion.  Cardiovascular: Normal rate and regular rhythm.   Respiratory: Effort normal.  GI: Soft.  Musculoskeletal:  Left knee motion is fairly good from 0-110.  She has medial joint line pain and crepitation.  There is no effusion.  Hip motion is full and pain free and SLR is negative on both sides.  There is no palpable LAD behind either knee.  Sensation and motor function are intact on both sides and there are palpable pulses on both sides.    Neurological: She is alert and oriented to person, place, and time.  Skin: Skin is warm and dry.  Psychiatric: She has a normal mood and affect. Her behavior is normal. Judgment and thought content normal.    Vital signs in last 24 hours:    Labs:   Estimated body mass index is 39.56 kg/(m^2) as calculated from the following:   Height as of 01/10/15: 5\' 6"  (1.676 m).   Weight as of 01/10/15: 111.131 kg (245 lb).   Imaging Review Plain radiographs demonstrate severe degenerative joint disease of the left knee(s). The overall alignment isneutral. The bone quality appears to be good for age and reported activity level.  Assessment/Plan:  End stage primary arthritis, left knee   The patient history, physical examination, clinical judgment of the provider and imaging studies are consistent with end stage degenerative joint disease of the left knee(s) and total knee arthroplasty is deemed medically necessary. The treatment options including medical management, injection therapy arthroscopy and arthroplasty were discussed at length. The risks and benefits of total knee arthroplasty were presented and reviewed. The risks due to aseptic loosening, infection, stiffness, patella tracking problems, thromboembolic complications and other imponderables were discussed. The patient acknowledged the explanation, agreed to proceed with the plan and consent was signed. Patient is being admitted for inpatient treatment  for surgery, pain control, PT, OT, prophylactic antibiotics, VTE prophylaxis, progressive ambulation and ADL's and discharge planning. The patient is planning to be discharged home with home health services

## 2015-03-29 ENCOUNTER — Other Ambulatory Visit: Payer: Self-pay | Admitting: Gastroenterology

## 2015-03-29 DIAGNOSIS — R1310 Dysphagia, unspecified: Secondary | ICD-10-CM | POA: Diagnosis not present

## 2015-03-29 DIAGNOSIS — Z86010 Personal history of colonic polyps: Secondary | ICD-10-CM | POA: Diagnosis not present

## 2015-03-29 DIAGNOSIS — Z800 Family history of malignant neoplasm of digestive organs: Secondary | ICD-10-CM | POA: Diagnosis not present

## 2015-03-29 DIAGNOSIS — D123 Benign neoplasm of transverse colon: Secondary | ICD-10-CM | POA: Diagnosis not present

## 2015-03-29 DIAGNOSIS — K648 Other hemorrhoids: Secondary | ICD-10-CM | POA: Diagnosis not present

## 2015-03-29 DIAGNOSIS — K921 Melena: Secondary | ICD-10-CM | POA: Diagnosis not present

## 2015-03-29 DIAGNOSIS — K644 Residual hemorrhoidal skin tags: Secondary | ICD-10-CM | POA: Diagnosis not present

## 2015-03-29 DIAGNOSIS — K5730 Diverticulosis of large intestine without perforation or abscess without bleeding: Secondary | ICD-10-CM | POA: Diagnosis not present

## 2015-03-29 DIAGNOSIS — Z1211 Encounter for screening for malignant neoplasm of colon: Secondary | ICD-10-CM | POA: Diagnosis not present

## 2015-03-29 DIAGNOSIS — D121 Benign neoplasm of appendix: Secondary | ICD-10-CM | POA: Diagnosis not present

## 2015-03-29 DIAGNOSIS — D126 Benign neoplasm of colon, unspecified: Secondary | ICD-10-CM | POA: Diagnosis not present

## 2015-04-02 MED ORDER — CEFAZOLIN SODIUM-DEXTROSE 2-3 GM-% IV SOLR
2.0000 g | INTRAVENOUS | Status: AC
  Administered 2015-04-03: 2 g via INTRAVENOUS
  Filled 2015-04-02: qty 50

## 2015-04-02 NOTE — Progress Notes (Signed)
Pt verbalize understanding surgery time change and arrival time of 0730 on 04/03/2015

## 2015-04-03 ENCOUNTER — Encounter (HOSPITAL_COMMUNITY)
Admission: RE | Disposition: A | Discharge status: Home / Self Care | Payer: Self-pay | Source: Ambulatory Visit | Attending: Orthopaedic Surgery

## 2015-04-03 ENCOUNTER — Encounter (HOSPITAL_COMMUNITY): Payer: Self-pay | Admitting: *Deleted

## 2015-04-03 ENCOUNTER — Inpatient Hospital Stay (HOSPITAL_COMMUNITY): Payer: Medicare Other | Admitting: Anesthesiology

## 2015-04-03 ENCOUNTER — Inpatient Hospital Stay (HOSPITAL_COMMUNITY): Payer: Medicare Other | Admitting: Emergency Medicine

## 2015-04-03 ENCOUNTER — Inpatient Hospital Stay (HOSPITAL_COMMUNITY)
Admission: RE | Admit: 2015-04-03 | Discharge: 2015-04-05 | DRG: 470 | Disposition: A | Discharge status: Home / Self Care | Payer: Medicare Other | Source: Ambulatory Visit | Attending: Orthopaedic Surgery | Admitting: Orthopaedic Surgery

## 2015-04-03 DIAGNOSIS — J45909 Unspecified asthma, uncomplicated: Secondary | ICD-10-CM | POA: Diagnosis present

## 2015-04-03 DIAGNOSIS — G2581 Restless legs syndrome: Secondary | ICD-10-CM | POA: Diagnosis present

## 2015-04-03 DIAGNOSIS — Z9884 Bariatric surgery status: Secondary | ICD-10-CM

## 2015-04-03 DIAGNOSIS — G4733 Obstructive sleep apnea (adult) (pediatric): Secondary | ICD-10-CM | POA: Diagnosis present

## 2015-04-03 DIAGNOSIS — E785 Hyperlipidemia, unspecified: Secondary | ICD-10-CM | POA: Diagnosis present

## 2015-04-03 DIAGNOSIS — M1712 Unilateral primary osteoarthritis, left knee: Principal | ICD-10-CM | POA: Diagnosis present

## 2015-04-03 DIAGNOSIS — F329 Major depressive disorder, single episode, unspecified: Secondary | ICD-10-CM | POA: Diagnosis present

## 2015-04-03 DIAGNOSIS — M549 Dorsalgia, unspecified: Secondary | ICD-10-CM | POA: Diagnosis not present

## 2015-04-03 DIAGNOSIS — Z8619 Personal history of other infectious and parasitic diseases: Secondary | ICD-10-CM

## 2015-04-03 DIAGNOSIS — M810 Age-related osteoporosis without current pathological fracture: Secondary | ICD-10-CM | POA: Diagnosis present

## 2015-04-03 DIAGNOSIS — G4700 Insomnia, unspecified: Secondary | ICD-10-CM | POA: Diagnosis present

## 2015-04-03 DIAGNOSIS — D649 Anemia, unspecified: Secondary | ICD-10-CM | POA: Diagnosis present

## 2015-04-03 DIAGNOSIS — M1710 Unilateral primary osteoarthritis, unspecified knee: Secondary | ICD-10-CM | POA: Diagnosis present

## 2015-04-03 DIAGNOSIS — K219 Gastro-esophageal reflux disease without esophagitis: Secondary | ICD-10-CM | POA: Diagnosis present

## 2015-04-03 DIAGNOSIS — G629 Polyneuropathy, unspecified: Secondary | ICD-10-CM | POA: Diagnosis present

## 2015-04-03 DIAGNOSIS — M179 Osteoarthritis of knee, unspecified: Secondary | ICD-10-CM | POA: Diagnosis not present

## 2015-04-03 DIAGNOSIS — M25562 Pain in left knee: Secondary | ICD-10-CM | POA: Diagnosis not present

## 2015-04-03 HISTORY — PX: TOTAL KNEE ARTHROPLASTY: SHX125

## 2015-04-03 SURGERY — ARTHROPLASTY, KNEE, TOTAL
Anesthesia: Monitor Anesthesia Care | Site: Knee | Laterality: Left

## 2015-04-03 MED ORDER — PHENYLEPHRINE HCL 10 MG/ML IJ SOLN
INTRAMUSCULAR | Status: DC | PRN
  Administered 2015-04-03: 80 ug via INTRAVENOUS
  Administered 2015-04-03: 40 ug via INTRAVENOUS
  Administered 2015-04-03: 80 ug via INTRAVENOUS
  Administered 2015-04-03: 40 ug via INTRAVENOUS
  Administered 2015-04-03 (×2): 80 ug via INTRAVENOUS
  Administered 2015-04-03: 40 ug via INTRAVENOUS
  Administered 2015-04-03: 80 ug via INTRAVENOUS
  Administered 2015-04-03: 40 ug via INTRAVENOUS
  Administered 2015-04-03 (×3): 80 ug via INTRAVENOUS
  Administered 2015-04-03 (×4): 40 ug via INTRAVENOUS

## 2015-04-03 MED ORDER — METHOCARBAMOL 500 MG PO TABS
500.0000 mg | ORAL_TABLET | Freq: Four times a day (QID) | ORAL | Status: DC | PRN
  Administered 2015-04-03 – 2015-04-05 (×5): 500 mg via ORAL
  Filled 2015-04-03 (×5): qty 1

## 2015-04-03 MED ORDER — ZOLPIDEM TARTRATE 5 MG PO TABS: 10.0000 mg | ORAL_TABLET | Freq: Every day | ORAL | Status: DC

## 2015-04-03 MED ORDER — ONDANSETRON HCL 4 MG/2ML IJ SOLN: 4.0000 mg | Freq: Once | INTRAMUSCULAR | Status: DC | PRN

## 2015-04-03 MED ORDER — CYCLOBENZAPRINE HCL 10 MG PO TABS: 10.0000 mg | ORAL_TABLET | Freq: Every day | ORAL | Status: DC | PRN

## 2015-04-03 MED ORDER — MENTHOL 3 MG MT LOZG: 1.0000 | LOZENGE | OROMUCOSAL | Status: DC | PRN

## 2015-04-03 MED ORDER — GABAPENTIN 300 MG PO CAPS
300.0000 mg | ORAL_CAPSULE | Freq: Two times a day (BID) | ORAL | Status: DC
  Administered 2015-04-03 – 2015-04-05 (×5): 300 mg via ORAL
  Filled 2015-04-03 (×5): qty 1

## 2015-04-03 MED ORDER — BISACODYL 5 MG PO TBEC: 5.0000 mg | DELAYED_RELEASE_TABLET | Freq: Every day | ORAL | Status: DC | PRN

## 2015-04-03 MED ORDER — ROCURONIUM BROMIDE 50 MG/5ML IV SOLN
INTRAVENOUS | Status: AC
  Filled 2015-04-03: qty 1

## 2015-04-03 MED ORDER — ONDANSETRON HCL 4 MG/2ML IJ SOLN
4.0000 mg | Freq: Four times a day (QID) | INTRAMUSCULAR | Status: DC | PRN
  Administered 2015-04-04: 4 mg via INTRAVENOUS
  Filled 2015-04-03: qty 2

## 2015-04-03 MED ORDER — HYDROCODONE-ACETAMINOPHEN 10-325 MG PO TABS
1.0000 | ORAL_TABLET | ORAL | Status: DC | PRN
  Administered 2015-04-03 – 2015-04-05 (×11): 2 via ORAL
  Filled 2015-04-03 (×13): qty 2

## 2015-04-03 MED ORDER — BUPIVACAINE IN DEXTROSE 0.75-8.25 % IT SOLN
INTRATHECAL | Status: DC | PRN
  Administered 2015-04-03: 15 mg via INTRATHECAL

## 2015-04-03 MED ORDER — BUPIVACAINE LIPOSOME 1.3 % IJ SUSP
20.0000 mL | INTRAMUSCULAR | Status: DC
  Filled 2015-04-03: qty 20

## 2015-04-03 MED ORDER — MOMETASONE FURO-FORMOTEROL FUM 200-5 MCG/ACT IN AERO
2.0000 | INHALATION_SPRAY | Freq: Two times a day (BID) | RESPIRATORY_TRACT | Status: DC | PRN
  Filled 2015-04-03: qty 8.8

## 2015-04-03 MED ORDER — ACETAMINOPHEN 650 MG RE SUPP: 650.0000 mg | Freq: Four times a day (QID) | RECTAL | Status: DC | PRN

## 2015-04-03 MED ORDER — 0.9 % SODIUM CHLORIDE (POUR BTL) OPTIME
TOPICAL | Status: DC | PRN
  Administered 2015-04-03: 1000 mL

## 2015-04-03 MED ORDER — OXYCODONE HCL 5 MG PO TABS
5.0000 mg | ORAL_TABLET | Freq: Once | ORAL | Status: AC | PRN
  Administered 2015-04-03: 5 mg via ORAL

## 2015-04-03 MED ORDER — BISOPROLOL FUMARATE 10 MG PO TABS
10.0000 mg | ORAL_TABLET | Freq: Two times a day (BID) | ORAL | Status: DC
  Administered 2015-04-03 – 2015-04-05 (×5): 10 mg via ORAL
  Filled 2015-04-03 (×7): qty 1

## 2015-04-03 MED ORDER — OXYCODONE HCL 5 MG/5ML PO SOLN: 5.0000 mg | Freq: Once | ORAL | Status: AC | PRN

## 2015-04-03 MED ORDER — ONDANSETRON HCL 4 MG/2ML IJ SOLN
INTRAMUSCULAR | Status: DC | PRN
  Administered 2015-04-03: 4 mg via INTRAVENOUS

## 2015-04-03 MED ORDER — METOCLOPRAMIDE HCL 5 MG PO TABS: 5.0000 mg | ORAL_TABLET | Freq: Three times a day (TID) | ORAL | Status: DC | PRN

## 2015-04-03 MED ORDER — DIPHENHYDRAMINE HCL 12.5 MG/5ML PO ELIX: 12.5000 mg | ORAL_SOLUTION | ORAL | Status: DC | PRN

## 2015-04-03 MED ORDER — FENTANYL CITRATE (PF) 100 MCG/2ML IJ SOLN
INTRAMUSCULAR | Status: DC | PRN
  Administered 2015-04-03: 50 ug via INTRAVENOUS

## 2015-04-03 MED ORDER — LACTATED RINGERS IV SOLN: INTRAVENOUS | Status: DC

## 2015-04-03 MED ORDER — MIDAZOLAM HCL 2 MG/2ML IJ SOLN
INTRAMUSCULAR | Status: AC
  Filled 2015-04-03: qty 2

## 2015-04-03 MED ORDER — PANTOPRAZOLE SODIUM 40 MG PO TBEC
80.0000 mg | DELAYED_RELEASE_TABLET | Freq: Every day | ORAL | Status: DC
  Administered 2015-04-03 – 2015-04-05 (×3): 80 mg via ORAL
  Filled 2015-04-03 (×3): qty 2

## 2015-04-03 MED ORDER — HYDROMORPHONE HCL 1 MG/ML IJ SOLN
0.5000 mg | INTRAMUSCULAR | Status: DC | PRN
  Administered 2015-04-03 – 2015-04-05 (×10): 1 mg via INTRAVENOUS
  Filled 2015-04-03 (×10): qty 1

## 2015-04-03 MED ORDER — ONDANSETRON HCL 4 MG PO TABS: 4.0000 mg | ORAL_TABLET | Freq: Four times a day (QID) | ORAL | Status: DC | PRN

## 2015-04-03 MED ORDER — DEXTROSE 5 % IV SOLN
500.0000 mg | Freq: Four times a day (QID) | INTRAVENOUS | Status: DC | PRN
  Filled 2015-04-03: qty 5

## 2015-04-03 MED ORDER — STERILE WATER FOR INJECTION IJ SOLN
INTRAMUSCULAR | Status: AC
  Filled 2015-04-03: qty 10

## 2015-04-03 MED ORDER — ACETAMINOPHEN 325 MG PO TABS
650.0000 mg | ORAL_TABLET | Freq: Four times a day (QID) | ORAL | Status: DC | PRN
Start: 2015-04-03 — End: 2015-04-05

## 2015-04-03 MED ORDER — DOCUSATE SODIUM 100 MG PO CAPS
100.0000 mg | ORAL_CAPSULE | Freq: Two times a day (BID) | ORAL | Status: DC
  Administered 2015-04-03 – 2015-04-05 (×5): 100 mg via ORAL
  Filled 2015-04-03 (×5): qty 1

## 2015-04-03 MED ORDER — ASPIRIN EC 325 MG PO TBEC
325.0000 mg | DELAYED_RELEASE_TABLET | Freq: Two times a day (BID) | ORAL | Status: DC
  Administered 2015-04-04 – 2015-04-05 (×3): 325 mg via ORAL
  Filled 2015-04-03 (×3): qty 1

## 2015-04-03 MED ORDER — TRANEXAMIC ACID 1000 MG/10ML IV SOLN
2000.0000 mg | INTRAVENOUS | Status: DC | PRN
  Administered 2015-04-03: 1000 mg via INTRAVENOUS
  Administered 2015-04-03: 2000 mg via INTRAVENOUS

## 2015-04-03 MED ORDER — SODIUM CHLORIDE 0.9 % IR SOLN
Status: DC | PRN
  Administered 2015-04-03: 1000 mL

## 2015-04-03 MED ORDER — HYDROMORPHONE HCL 1 MG/ML IJ SOLN
0.2500 mg | INTRAMUSCULAR | Status: DC | PRN
  Administered 2015-04-03 (×2): 0.5 mg via INTRAVENOUS

## 2015-04-03 MED ORDER — MIDAZOLAM HCL 2 MG/2ML IJ SOLN
INTRAMUSCULAR | Status: DC
Start: 2015-04-03 — End: 2015-04-03
  Filled 2015-04-03: qty 2

## 2015-04-03 MED ORDER — BUPIVACAINE LIPOSOME 1.3 % IJ SUSP
INTRAMUSCULAR | Status: DC | PRN
  Administered 2015-04-03: 20 mL

## 2015-04-03 MED ORDER — SUMATRIPTAN SUCCINATE 100 MG PO TABS
100.0000 mg | ORAL_TABLET | Freq: Once | ORAL | Status: AC | PRN
  Filled 2015-04-03: qty 1

## 2015-04-03 MED ORDER — OXYCODONE HCL 5 MG PO TABS
ORAL_TABLET | ORAL | Status: AC
  Administered 2015-04-03: 5 mg via ORAL
  Filled 2015-04-03: qty 1

## 2015-04-03 MED ORDER — AMPHETAMINE-DEXTROAMPHETAMINE 10 MG PO TABS
10.0000 mg | ORAL_TABLET | Freq: Every day | ORAL | Status: DC
  Administered 2015-04-04 – 2015-04-05 (×2): 10 mg via ORAL
  Filled 2015-04-03 (×2): qty 1

## 2015-04-03 MED ORDER — PROPOFOL 10 MG/ML IV BOLUS
INTRAVENOUS | Status: AC
  Filled 2015-04-03: qty 20

## 2015-04-03 MED ORDER — DULOXETINE HCL 30 MG PO CPEP
30.0000 mg | ORAL_CAPSULE | Freq: Two times a day (BID) | ORAL | Status: DC
  Administered 2015-04-03 – 2015-04-05 (×5): 30 mg via ORAL
  Filled 2015-04-03 (×5): qty 1

## 2015-04-03 MED ORDER — PHENYLEPHRINE 40 MCG/ML (10ML) SYRINGE FOR IV PUSH (FOR BLOOD PRESSURE SUPPORT)
PREFILLED_SYRINGE | INTRAVENOUS | Status: AC
  Filled 2015-04-03: qty 10

## 2015-04-03 MED ORDER — ROPINIROLE HCL 1 MG PO TABS: 1.0000 mg | ORAL_TABLET | Freq: Every evening | ORAL | Status: DC | PRN

## 2015-04-03 MED ORDER — TRANEXAMIC ACID 1000 MG/10ML IV SOLN
2000.0000 mg | INTRAVENOUS | Status: DC
  Filled 2015-04-03: qty 20

## 2015-04-03 MED ORDER — LACTATED RINGERS IV SOLN
INTRAVENOUS | Status: DC
  Administered 2015-04-03: 09:00:00 via INTRAVENOUS

## 2015-04-03 MED ORDER — EPHEDRINE SULFATE 50 MG/ML IJ SOLN
INTRAMUSCULAR | Status: AC
  Filled 2015-04-03: qty 1

## 2015-04-03 MED ORDER — BUPIVACAINE-EPINEPHRINE 0.5% -1:200000 IJ SOLN
INTRAMUSCULAR | Status: DC | PRN
  Administered 2015-04-03: 20 mL

## 2015-04-03 MED ORDER — FENTANYL CITRATE (PF) 100 MCG/2ML IJ SOLN
INTRAMUSCULAR | Status: DC
Start: 2015-04-03 — End: 2015-04-03
  Filled 2015-04-03: qty 2

## 2015-04-03 MED ORDER — METOCLOPRAMIDE HCL 5 MG/ML IJ SOLN: 5.0000 mg | Freq: Three times a day (TID) | INTRAMUSCULAR | Status: DC | PRN

## 2015-04-03 MED ORDER — LIDOCAINE HCL (CARDIAC) 20 MG/ML IV SOLN
INTRAVENOUS | Status: AC
  Filled 2015-04-03: qty 5

## 2015-04-03 MED ORDER — HYDROMORPHONE HCL 1 MG/ML IJ SOLN
INTRAMUSCULAR | Status: AC
  Filled 2015-04-03: qty 1

## 2015-04-03 MED ORDER — FENTANYL CITRATE (PF) 250 MCG/5ML IJ SOLN
INTRAMUSCULAR | Status: AC
  Filled 2015-04-03: qty 5

## 2015-04-03 MED ORDER — LACTATED RINGERS IV SOLN
INTRAVENOUS | Status: DC | PRN
  Administered 2015-04-03 (×2): via INTRAVENOUS

## 2015-04-03 MED ORDER — MIDAZOLAM HCL 5 MG/5ML IJ SOLN
INTRAMUSCULAR | Status: DC | PRN
  Administered 2015-04-03: 2 mg via INTRAVENOUS

## 2015-04-03 MED ORDER — ALUM & MAG HYDROXIDE-SIMETH 200-200-20 MG/5ML PO SUSP: 30.0000 mL | ORAL | Status: DC | PRN

## 2015-04-03 MED ORDER — SUCCINYLCHOLINE CHLORIDE 20 MG/ML IJ SOLN
INTRAMUSCULAR | Status: AC
  Filled 2015-04-03: qty 1

## 2015-04-03 MED ORDER — ONDANSETRON HCL 4 MG/2ML IJ SOLN
INTRAMUSCULAR | Status: AC
  Filled 2015-04-03: qty 2

## 2015-04-03 MED ORDER — PHENOL 1.4 % MT LIQD: 1.0000 | OROMUCOSAL | Status: DC | PRN

## 2015-04-03 MED ORDER — BUPIVACAINE-EPINEPHRINE (PF) 0.5% -1:200000 IJ SOLN
INTRAMUSCULAR | Status: AC
  Filled 2015-04-03: qty 30

## 2015-04-03 MED ORDER — PROPOFOL INFUSION 10 MG/ML OPTIME
INTRAVENOUS | Status: DC | PRN
  Administered 2015-04-03: 75 ug/kg/min via INTRAVENOUS

## 2015-04-03 MED ORDER — QUININE SULFATE 324 MG PO CAPS
324.0000 mg | ORAL_CAPSULE | Freq: Two times a day (BID) | ORAL | Status: DC
  Administered 2015-04-03 – 2015-04-05 (×4): 324 mg via ORAL
  Filled 2015-04-03 (×6): qty 1

## 2015-04-03 MED ORDER — ZOLPIDEM TARTRATE 5 MG PO TABS
5.0000 mg | ORAL_TABLET | Freq: Every evening | ORAL | Status: DC | PRN
  Administered 2015-04-03 – 2015-04-04 (×2): 5 mg via ORAL
  Filled 2015-04-03 (×3): qty 1

## 2015-04-03 MED ORDER — CEFAZOLIN SODIUM-DEXTROSE 2-3 GM-% IV SOLR
2.0000 g | Freq: Four times a day (QID) | INTRAVENOUS | Status: AC
  Administered 2015-04-03 (×2): 2 g via INTRAVENOUS
  Filled 2015-04-03 (×3): qty 50

## 2015-04-03 SURGICAL SUPPLY — 74 items
APL SKNCLS STERI-STRIP NONHPOA (GAUZE/BANDAGES/DRESSINGS) ×1
BAG DECANTER FOR FLEXI CONT (MISCELLANEOUS) ×2 IMPLANT
BANDAGE ELASTIC 4 VELCRO ST LF (GAUZE/BANDAGES/DRESSINGS) ×2 IMPLANT
BANDAGE ESMARK 6X9 LF (GAUZE/BANDAGES/DRESSINGS) ×1 IMPLANT
BENZOIN TINCTURE PRP APPL 2/3 (GAUZE/BANDAGES/DRESSINGS) ×2 IMPLANT
BLADE SAGITTAL 25.0X1.19X90 (BLADE) ×1 IMPLANT
BLADE SAW SGTL 13.0X1.19X90.0M (BLADE) ×1 IMPLANT
BLADE SURG ROTATE 9660 (MISCELLANEOUS) IMPLANT
BNDG CMPR 9X6 STRL LF SNTH (GAUZE/BANDAGES/DRESSINGS) ×1
BNDG CMPR MED 10X6 ELC LF (GAUZE/BANDAGES/DRESSINGS) ×1
BNDG ELASTIC 6X10 VLCR STRL LF (GAUZE/BANDAGES/DRESSINGS) ×2 IMPLANT
BNDG ESMARK 6X9 LF (GAUZE/BANDAGES/DRESSINGS) ×2
BNDG GAUZE ELAST 4 BULKY (GAUZE/BANDAGES/DRESSINGS) ×4 IMPLANT
BOWL SMART MIX CTS (DISPOSABLE) ×2 IMPLANT
CAP KNEE TOTAL 3 SIGMA ×1 IMPLANT
CEMENT HV SMART SET (Cement) ×4 IMPLANT
CLSR STERI-STRIP ANTIMIC 1/2X4 (GAUZE/BANDAGES/DRESSINGS) ×1 IMPLANT
COVER SURGICAL LIGHT HANDLE (MISCELLANEOUS) ×2 IMPLANT
CUFF TOURNIQUET SINGLE 34IN LL (TOURNIQUET CUFF) ×2 IMPLANT
DRAPE EXTREMITY T 121X128X90 (DRAPE) ×2 IMPLANT
DRAPE IMP U-DRAPE 54X76 (DRAPES) ×2 IMPLANT
DRAPE PROXIMA HALF (DRAPES) ×2 IMPLANT
DRAPE U-SHAPE 47X51 STRL (DRAPES) ×2 IMPLANT
DRSG ADAPTIC 3X8 NADH LF (GAUZE/BANDAGES/DRESSINGS) ×2 IMPLANT
DRSG PAD ABDOMINAL 8X10 ST (GAUZE/BANDAGES/DRESSINGS) ×3 IMPLANT
DURAPREP 26ML APPLICATOR (WOUND CARE) ×2 IMPLANT
ELECT REM PT RETURN 9FT ADLT (ELECTROSURGICAL) ×2
ELECTRODE REM PT RTRN 9FT ADLT (ELECTROSURGICAL) ×1 IMPLANT
GAUZE SPONGE 4X4 12PLY STRL (GAUZE/BANDAGES/DRESSINGS) ×2 IMPLANT
GLOVE BIO SURGEON STRL SZ 6.5 (GLOVE) ×1 IMPLANT
GLOVE BIO SURGEON STRL SZ7 (GLOVE) ×1 IMPLANT
GLOVE BIO SURGEON STRL SZ8 (GLOVE) ×4 IMPLANT
GLOVE BIOGEL PI IND STRL 6.5 (GLOVE) IMPLANT
GLOVE BIOGEL PI IND STRL 8 (GLOVE) ×2 IMPLANT
GLOVE BIOGEL PI INDICATOR 6.5 (GLOVE) ×2
GLOVE BIOGEL PI INDICATOR 8 (GLOVE) ×2
GLOVE SURG SS PI 7.5 STRL IVOR (GLOVE) ×2 IMPLANT
GOWN STRL REUS W/ TWL LRG LVL3 (GOWN DISPOSABLE) ×1 IMPLANT
GOWN STRL REUS W/ TWL XL LVL3 (GOWN DISPOSABLE) ×2 IMPLANT
GOWN STRL REUS W/TWL 2XL LVL3 (GOWN DISPOSABLE) ×1 IMPLANT
GOWN STRL REUS W/TWL LRG LVL3 (GOWN DISPOSABLE) ×6
GOWN STRL REUS W/TWL XL LVL3 (GOWN DISPOSABLE) ×4
HANDPIECE INTERPULSE COAX TIP (DISPOSABLE) ×2
HOOD PEEL AWAY FACE SHEILD DIS (HOOD) ×4 IMPLANT
IMMOBILIZER KNEE 20 (SOFTGOODS) IMPLANT
IMMOBILIZER KNEE 22 UNIV (SOFTGOODS) ×2 IMPLANT
IMMOBILIZER KNEE 24 THIGH 36 (MISCELLANEOUS) IMPLANT
IMMOBILIZER KNEE 24 UNIV (MISCELLANEOUS)
INSERT TIB LCS RP STD+ 10 (Knees) IMPLANT
KIT BASIN OR (CUSTOM PROCEDURE TRAY) ×2 IMPLANT
KIT ROOM TURNOVER OR (KITS) ×2 IMPLANT
MANIFOLD NEPTUNE II (INSTRUMENTS) ×2 IMPLANT
NDL HYPO 21X1 ECLIPSE (NEEDLE) ×1 IMPLANT
NEEDLE HYPO 21X1 ECLIPSE (NEEDLE) ×2 IMPLANT
NS IRRIG 1000ML POUR BTL (IV SOLUTION) ×2 IMPLANT
PACK TOTAL JOINT (CUSTOM PROCEDURE TRAY) ×2 IMPLANT
PACK UNIVERSAL I (CUSTOM PROCEDURE TRAY) ×2 IMPLANT
PAD ARMBOARD 7.5X6 YLW CONV (MISCELLANEOUS) ×4 IMPLANT
SET HNDPC FAN SPRY TIP SCT (DISPOSABLE) ×1 IMPLANT
STAPLER VISISTAT 35W (STAPLE) IMPLANT
STRIP CLOSURE SKIN 1/2X4 (GAUZE/BANDAGES/DRESSINGS) ×2 IMPLANT
SUCTION FRAZIER TIP 10 FR DISP (SUCTIONS) IMPLANT
SUT MNCRL AB 3-0 PS2 18 (SUTURE) IMPLANT
SUT VIC AB 0 CT1 27 (SUTURE) ×4
SUT VIC AB 0 CT1 27XBRD ANBCTR (SUTURE) ×2 IMPLANT
SUT VIC AB 2-0 CT1 27 (SUTURE) ×4
SUT VIC AB 2-0 CT1 TAPERPNT 27 (SUTURE) ×2 IMPLANT
SUT VLOC 180 0 24IN GS25 (SUTURE) ×2 IMPLANT
SYR 50ML LL SCALE MARK (SYRINGE) ×2 IMPLANT
TOWEL OR 17X24 6PK STRL BLUE (TOWEL DISPOSABLE) ×2 IMPLANT
TOWEL OR 17X26 10 PK STRL BLUE (TOWEL DISPOSABLE) ×2 IMPLANT
TRAY FOLEY CATH 14FR (SET/KITS/TRAYS/PACK) IMPLANT
UPCHARGE REV TRAY MBT KNEE ×1 IMPLANT
WATER STERILE IRR 1000ML POUR (IV SOLUTION) ×3 IMPLANT

## 2015-04-03 NOTE — Op Note (Signed)
PREOP DIAGNOSIS: DJD LEFT KNEE POSTOP DIAGNOSIS:  same PROCEDURE: LEFT TKR ANESTHESIA: Spinal and MAC ATTENDING SURGEON: Anaiya Wisinski G ASSISTANT: Janice Dolly PA  INDICATIONS FOR PROCEDURE: [name] is a 69 y.o. female who has struggled for a long time with pain due to degenerative arthritis of the left knee.  The patient has failed many conservative non-operative measures and at this point has pain which limits the ability to sleep and walk.  The patient is offered total knee replacement.  Informed operative consent was obtained after discussion of possible risks of anesthesia, infection, neurovascular injury, DVT, and death.  The importance of the post-operative rehabilitation protocol to optimize result was stressed extensively with the patient.  SUMMARY OF FINDINGS AND PROCEDURE:  [name] was taken to the operative suite where under the above anesthesia a left knee replacement was performed.  There were advanced degenerative changes and the bone quality was good.  We used the DePuyLCS system and placed size standard plus femur, 4 MBT tibia, 38 mm all polyethylene patella, and a size 10 mm spacer.  Janice Dolly PA-C assisted throughout and was invaluable to the completion of the case in that he helped retract and maintain exposure while I placed the components.  He also helped close thereby minimizing OR time.  The patient was admitted for appropriate post-op care to include perioperative antibiotics and mechanical and pharmacologic measures for DVT prophylaxis.  DESCRIPTION OF PROCEDURE:  [name] was taken to the operative suite where the above anesthesia was applied.  The patient was positioned supine and prepped and draped in normal sterile fashion.  An appropriate time out was performed.  After the administration of kefzol pre-op antibiotic the leg was elevated and exsanguinated and a tourniquet inflated.  A standard longitudinal incision was made on the anterior knee.  Dissection  was carried down to the extensor mechanism.  All appropriate anti-infective measures were used including the pre-operative antibiotic, betadine impregnated drape, and closed hooded exhaust systems for each member of the surgical team.  A medial parapatellar incision was made in the extensor mechanism and the knee cap flipped and the knee flexed.  Some residual meniscal tissues were removed along with any remaining ACL/PCL tissue.  A guide was placed on the tibia and a flat cut was made on it's superior surface.  An intramedullary guide was placed in the femur and was utilized to make anterior and posterior cuts creating an appropriate flexion gap.  A second intramedullary guide was placed in the femur to make a distal cut properly balancing the knee with an extension gap equal to the flexion gap.  The three bones sized to the above mentioned sizes and the appropriate guides were placed and utilized.  A trial reduction was done and the knee easily came to full extension and the patella tracked well on flexion.  The trial components were removed and all bones were cleaned with pulsatile lavage and then dried thoroughly.  Cement was mixed and was pressurized onto the bones followed by placement of the aforementioned components.  Excess cement was trimmed and pressure was held on the components until the cement had hardened.  The tourniquet was deflated and a small amount of bleeding was controlled with cautery and pressure.  The knee was irrigated thoroughly.  The extensor mechanism was re-approximated with V-loc suture in running fashion.  The knee was flexed and the repair was solid.  The subcutaneous tissues were re-approximated with #0 and #2-0 vicryl and the  skin closed with a subcuticular stitch and steristrips.  A sterile dressing was applied.  Intraoperative fluids, EBL, and tourniquet time can be obtained from anesthesia records.  DISPOSITION:  The patient was taken to recovery room in stable condition and  admitted for appropriate post-op care to include peri-operative antibiotic and DVT prophylaxis with mechanical and pharmacologic measures.  Jvon Meroney G 04/03/2015, 10:57 AM

## 2015-04-03 NOTE — Anesthesia Procedure Notes (Addendum)
Procedure Name: MAC Date/Time: 04/03/2015 9:39 AM Performed by: Garrison Columbus T Pre-anesthesia Checklist: Patient identified, Emergency Drugs available, Suction available and Patient being monitored Patient Re-evaluated:Patient Re-evaluated prior to inductionOxygen Delivery Method: Simple face mask Preoxygenation: Pre-oxygenation with 100% oxygen Intubation Type: IV induction Placement Confirmation: positive ETCO2 and breath sounds checked- equal and bilateral Dental Injury: Teeth and Oropharynx as per pre-operative assessment    Spinal Patient location during procedure: OR Start time: 04/03/2015 9:25 AM End time: 04/03/2015 9:38 AM Staffing Anesthesiologist: Roderic Palau Performed by: anesthesiologist  Preanesthetic Checklist Completed: patient identified, surgical consent, pre-op evaluation, timeout performed, IV checked, risks and benefits discussed and monitors and equipment checked Spinal Block Patient position: sitting Prep: Betadine Patient monitoring: cardiac monitor, continuous pulse ox and blood pressure Approach: midline Location: L4-5 (Attempted L2-3, and L3-4) Injection technique: single-shot Needle Needle type: Quincke  Needle gauge: 22 G (Attempted 24G Pencan) Needle length: 9 cm Assessment Sensory level: T8 Additional Notes Functioning IV was confirmed and monitors were applied. Sterile prep and drape, including hand hygiene and sterile gloves were used. The patient was positioned and the spine was prepped. The skin was anesthetized with lidocaine.  Free flow of clear CSF was obtained prior to injecting local anesthetic into the CSF.  The spinal needle aspirated freely following injection.  The needle was carefully withdrawn.  The patient tolerated the procedure well.

## 2015-04-03 NOTE — Anesthesia Postprocedure Evaluation (Signed)
  Anesthesia Post-op Note  Patient: [name]  Procedure(s) Performed: Procedure(s): LEFT TOTAL KNEE ARTHROPLASTY (Left)  Patient Location: PACU  Anesthesia Type: Spinal/MAC  Level of Consciousness: awake and alert   Airway and Oxygen Therapy: Patient Spontanous Breathing  Post-op Pain: moderate  Post-op Assessment: Post-op Vital signs reviewed, Patient's Cardiovascular Status Stable and Respiratory Function Stable. No residual motor block.  Post-op Vital Signs: Reviewed  Filed Vitals:   04/03/15 1250  BP:   Pulse: 64  Temp:   Resp: 17    Complications: No apparent anesthesia complications

## 2015-04-03 NOTE — Transfer of Care (Signed)
Immediate Anesthesia Transfer of Care Note  Patient: [name]  Procedure(s) Performed: Procedure(s): LEFT TOTAL KNEE ARTHROPLASTY (Left)  Patient Location: PACU  Anesthesia Type:MAC and Spinal  Level of Consciousness: awake, alert  and oriented  Airway & Oxygen Therapy: Patient Spontanous Breathing  Post-op Assessment: Report given to RN, Post -op Vital signs reviewed and stable and Patient moving all extremities X 4  Post vital signs: Reviewed and stable  Last Vitals:  Filed Vitals:   04/03/15 0757  BP: 148/61  Pulse: 72  Temp: 36.2 C  Resp: 18    Complications: No apparent anesthesia complications

## 2015-04-03 NOTE — Interval H&P Note (Signed)
OK for surgery PD 

## 2015-04-03 NOTE — Progress Notes (Signed)
Utilization review completed.  

## 2015-04-03 NOTE — Progress Notes (Signed)
Orthopedic Tech Progress Note Patient Details:  [name] [birth year] [id number] On cpm at 7:45 pm Patient ID: [name] Bentley, female   DOB: [birth year], 69 y.o.   MRN: [id number]   Braulio Bosch 04/03/2015, 7:48 PM

## 2015-04-03 NOTE — Evaluation (Signed)
Physical Therapy Evaluation Patient Details Name: [name] MRN: [id number] DOB: [birth year] Today's Date: 04/03/2015   History of Present Illness  Pt s/p L TKA on 04/03/2015.  PMH: fatigue, sleep apnea, depression, morbid obesity, hyperlipidemia, gastric bypass  Clinical Impression  Pt is s/p L TKA resulting in the deficits listed below (see PT Problem List). Pt tolerated treatment well, but demonstrated impulsive behavior during therapy and was unaware of deficits/limitations in strength and endurance.  Pt needs max v/c's for safety during all mobility, with therapist setting limitations for safety and has previously fallen post-surgery due to impulsive behavior while unattended. Pt unable to perform quad set today, but was able to ambulate with LLE immobilizer, min assist and chair follow.  Recommend d/c home with home health PT, as pt has family support at home. Pt will benefit from skilled PT to increase their independence and safety with mobility to allow discharge to the venue listed below.      Follow Up Recommendations Home health PT;Supervision for mobility/OOB    Equipment Recommendations  None recommended by PT    Recommendations for Other Services       Precautions / Restrictions Precautions Precautions: Knee Precaution Booklet Issued: Yes (comment) Precaution Comments: Reviewed with patient Required Braces or Orthoses: Knee Immobilizer - Left Knee Immobilizer - Left: On when out of bed or walking (Unable to initiate quad set) Restrictions Weight Bearing Restrictions: WBAT     Mobility  Bed Mobility Overal bed mobility: Needs Assistance Bed Mobility: Supine to Sit     Supine to sit: Min assist     General bed mobility comments: HOB elevated, v/c's for hand placement, min physical assist for management of LLE  Transfers Overall transfer level: Needs assistance Equipment used: Rolling walker (2 wheeled) Transfers: Sit to/from [facility] Sit to  Stand: Mod assist Stand pivot transfers: Mod assist       General transfer comment: max v/c's for safe hand placement, needed several attempts at pushing up with arms to gain momentum to stand, needed moderate physical assist to lift body for hand placement onto walker  Ambulation/Gait Ambulation/Gait assistance: Min assist;+2 safety/equipment Ambulation Distance (Feet): 35 Feet Assistive device: Rolling walker (2 wheeled) Gait Pattern/deviations: Step-to pattern;Antalgic Gait velocity: Slow Gait velocity interpretation: Below normal speed for age/gender General Gait Details: v/c's needed for safe use of RW and WB on LLE, unaware of deficits and decreased strength/endurance, wanted to continue longer than was physically appropriate/safe. + dizziness.  Stairs            Wheelchair Mobility    Modified Rankin (Stroke Patients Only)       Balance Overall balance assessment: Needs assistance         Standing balance support: Bilateral upper extremity supported;During functional activity Standing balance-Leahy Scale: Poor Standing balance comment: Pt relied heavily on RW during ambulation and functional activities                               Pertinent Vitals/Pain Pain Assessment: 0-10 Pain Score: 8  Pain Location: L knee Pain Descriptors / Indicators: Guarding;Sore Pain Intervention(s): Monitored during session    Home Living Family/patient expects to be discharged to:: Private residence Living Arrangements: Spouse/significant other Available Help at Discharge: Family Type of Home: House Home Access: Stairs to enter Entrance Stairs-Rails: None Entrance Stairs-Number of Steps: 4 Home Layout: One level Home Equipment: Environmental consultant - 2 wheels;Cane - single point Additional Comments:  Was using cane for a few weeks prior to surgery, also has walker at home    Prior Function Level of Independence: Independent with assistive device(s)         Comments:  Used walker a few weeks prior to surgery     Hand Dominance        Extremity/Trunk Assessment   Upper Extremity Assessment: Overall WFL for tasks assessed           Lower Extremity Assessment: LLE deficits/detail   LLE Deficits / Details: Decreased strength and ROM due to post-op status, unable to perform quad set on L  Cervical / Trunk Assessment: Normal  Communication   Communication: No difficulties  Cognition Arousal/Alertness: Awake/alert Behavior During Therapy: Impulsive Overall Cognitive Status: Within Functional Limits for tasks assessed       Memory:  (Good recall of precautions)              General Comments      Exercises Total Joint Exercises Ankle Circles/Pumps: AROM;Left;Right;10 reps;Supine Quad Sets: AROM;5 reps;Left;Supine Heel Slides: AROM;5 reps;Supine;Left Goniometric ROM: AROM knee flex/ext: 13-60 degrees      Assessment/Plan    PT Assessment Patient needs continued PT services  PT Diagnosis Difficulty walking;Abnormality of gait;Generalized weakness;Acute pain   PT Problem List Decreased strength;Decreased range of motion;Decreased activity tolerance;Decreased balance;Decreased mobility;Decreased knowledge of use of DME;Decreased safety awareness;Decreased knowledge of precautions  PT Treatment Interventions DME instruction;Gait training;Stair training;Functional mobility training;Therapeutic activities;Therapeutic exercise;Balance training;Neuromuscular re-education;Patient/family education   PT Goals (Current goals can be found in the Care Plan section) Acute Rehab PT Goals Patient Stated Goal: None stated PT Goal Formulation: With patient/family Time For Goal Achievement: 04/17/15 Potential to Achieve Goals: Good    Frequency 7X/week   Barriers to discharge        Co-evaluation               End of Session Equipment Utilized During Treatment: Gait belt;Left knee immobilizer Activity Tolerance: Patient tolerated  treatment well Patient left: in chair;with call bell/phone within reach;with family/visitor present (with LLE in extension prop with towel) Nurse Communication: Mobility status (Impulsive behavior)         Time: [id number] PT Time Calculation (min) (ACUTE ONLY): 44 min   Charges:   PT Evaluation $Initial PT Evaluation Tier I: 1 Procedure PT Treatments $Gait Training: 8-22 mins $Therapeutic Activity: 8-22 mins   PT G CodesCandy Sledge A 2015/04/15, 5:12 PM  Candy Sledge, PT, DPT [id number]    Lucas Mallow, SPT (student physical therapist) Office phone: [phone]

## 2015-04-03 NOTE — Anesthesia Preprocedure Evaluation (Signed)
Anesthesia Evaluation  Patient identified by MRN, date of birth, ID band Patient awake    Reviewed: Allergy & Precautions, NPO status , Patient's Chart, lab work & pertinent test results  Airway Mallampati: II  TM Distance: >3 FB Neck ROM: Full    Dental  (+) Teeth Intact, Dental Advisory Given   Pulmonary  breath sounds clear to auscultation        Cardiovascular Rhythm:Regular Rate:Normal     Neuro/Psych    GI/Hepatic   Endo/Other  diabetes  Renal/GU      Musculoskeletal   Abdominal (+) + obese,   Peds  Hematology   Anesthesia Other Findings   Reproductive/Obstetrics                             Anesthesia Physical Anesthesia Plan  ASA: III  Anesthesia Plan: MAC and Spinal   Post-op Pain Management:    Induction: Intravenous  Airway Management Planned:   Additional Equipment:   Intra-op Plan:   Post-operative Plan:   Informed Consent: I have reviewed the patients History and Physical, chart, labs and discussed the procedure including the risks, benefits and alternatives for the proposed anesthesia with the patient or authorized representative who has indicated his/her understanding and acceptance.   Dental advisory given  Plan Discussed with: CRNA and Anesthesiologist  Anesthesia Plan Comments: (DJD L. Knee Hypertension Obesity, S/P gastric bypass Sleep apnea on CPAP  Plan SAB  Roberts Gaudy, MD)        Anesthesia Quick Evaluation

## 2015-04-03 NOTE — Progress Notes (Signed)
Orthopedic Tech Progress Note Patient Details:  [name] 02-20-1946 [id number]  CPM Left Knee CPM Left Knee: On Left Knee Flexion (Degrees): 90 Left Knee Extension (Degrees): 0 Additional Comments: trapeze bar patient helper Viewed order from doctor's order list  [name] 04/03/2015, 12:11 PM

## 2015-04-04 ENCOUNTER — Encounter (HOSPITAL_COMMUNITY): Payer: Self-pay | Admitting: Orthopaedic Surgery

## 2015-04-04 NOTE — Progress Notes (Signed)
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  PMH: fatigue, sleep apnea, depression, morbid obesity, hyperlipidemia, gastric bypass    PT Comments    Pt is making progress towards goals and increasing functional independence. Pt able to progress ambulation this afternoon. Focus on stair training in the AM. Continue with POC.  Follow Up Recommendations  Home health PT;Supervision for mobility/OOB     Equipment Recommendations  None recommended by PT    Recommendations for Other Services       Precautions / Restrictions Precautions Precautions: Knee Precaution Comments: Reviewed with patient Required Braces or Orthoses: Knee Immobilizer - Left Knee Immobilizer - Left: On when out of bed or walking Restrictions Weight Bearing Restrictions: Yes LLE Weight Bearing: Weight bearing as tolerated    Mobility  Bed Mobility Overal bed mobility: Modified Independent Bed Mobility: Supine to Sit     Supine to sit: Supervision     General bed mobility comments: Supervision for safety. Pt able to use leg hook technique to move BLE to EOB. Use of rails to push up.  Transfers Overall transfer level: Needs assistance Equipment used: Rolling walker (2 wheeled) Transfers: Sit to/from Stand Sit to Stand: Min guard         General transfer comment: Min guard for safety. Cues for hand placement.  Ambulation/Gait Ambulation/Gait assistance: Min guard Ambulation Distance (Feet): 300 Feet Assistive device: Rolling walker (2 wheeled) Gait Pattern/deviations: Step-through pattern;Antalgic;Decreased stance time - left;Trunk flexed   Gait velocity interpretation: Below normal speed for age/gender General Gait Details: Cues for upright posture/forward head and to keep RW closer to body.   Stairs            Wheelchair Mobility    Modified Rankin  (Stroke Patients Only)       Balance                                    Cognition Arousal/Alertness: Awake/alert Behavior During Therapy: WFL for tasks assessed/performed Overall Cognitive Status: Within Functional Limits for tasks assessed                      Exercises Total Joint Exercises Quad Sets: AROM;Left;10 reps Heel Slides: AAROM;Left;10 reps Hip ABduction/ADduction: AAROM;Left;10 reps Straight Leg Raises: AAROM;Left;10 reps    General Comments        Pertinent Vitals/Pain Pain Assessment: 0-10 Pain Score: 6  Pain Location: L knee Pain Descriptors / Indicators: Sore Pain Intervention(s): Monitored during session    Home Living                      Prior Function            PT Goals (current goals can now be found in the care plan section) Progress towards PT goals: Progressing toward goals    Frequency  7X/week    PT Plan Current plan remains appropriate    Co-evaluation             End of Session Equipment Utilized During Treatment: Gait belt;Left knee immobilizer Activity Tolerance: Patient tolerated treatment well Patient left: in chair;with call bell/phone within reach     Time: [id number] PT Time Calculation (min) (ACUTE ONLY): 36 min  Charges:  $Gait Training: 8-22 mins $Therapeutic Exercise: 8-22 mins  G CodesRubye Oaks, [place] 04/04/2015, 3:33 PM

## 2015-04-04 NOTE — Evaluation (Signed)
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  PMH: fatigue, sleep apnea, depression, morbid obesity, hyperlipidemia, gastric bypass   Clinical Impression   Pt is s/p TKA resulting in the deficits listed below (see OT Problem List).  Pt will benefit from skilled OT to increase their safety and independence with ADL and functional mobility for ADL to facilitate discharge to venue listed below.        Follow Up Recommendations  No OT follow up    Equipment Recommendations  None recommended by OT;Other (comment) (pt borrowing tub bench)    Recommendations for Other Services       Precautions / Restrictions Precautions Precautions: Knee Precaution Booklet Issued: Yes (comment) Precaution Comments: Reviewed with patient Required Braces or Orthoses: Knee Immobilizer - Left Knee Immobilizer - Left: On when out of bed or walking (Unable to initiate quad set) Restrictions Weight Bearing Restrictions: No      Mobility Bed Mobility               General bed mobility comments: pt in chair  Transfers Overall transfer level: Needs assistance Equipment used: Rolling walker (2 wheeled) Transfers: Sit to/from Stand Sit to Stand: Min guard Stand pivot transfers: Min guard       General transfer comment: VC for safety and hand placement         ADL Overall ADL's : Needs assistance/impaired     Grooming: Sitting;Set up   Upper Body Bathing: Set up;Sitting   Lower Body Bathing: Moderate assistance;Sit to/from stand   Upper Body Dressing : Set up;Sitting   Lower Body Dressing: Moderate assistance;Sit to/from stand   Toilet Transfer: Moderate assistance;RW;BSC   Toileting- Clothing Manipulation and Hygiene: Minimal assistance;Sit to/from stand         General ADL Comments: VC for safety and cues                Pertinent  Vitals/Pain  3/10  L knee- sore Ice applied    Communication Communication Communication: No difficulties   Cognition Arousal/Alertness: Awake/alert Behavior During Therapy: WFL for tasks assessed/performed Overall Cognitive Status: Within Functional Limits for tasks assessed                                Home Living Family/patient expects to be discharged to:: Private residence Living Arrangements: Spouse/significant other Available Help at Discharge: Family Type of Home: House Home Access: Stairs to enter Technical brewer of Steps: 4 Entrance Stairs-Rails: None Home Layout: One level     Bathroom Shower/Tub: Tub/shower unit Shower/tub characteristics: [place]: Environmental consultant - 2 wheels;Cane - single point;Tub bench   Additional Comments: Was using cane for a few weeks prior to surgery, also has walker at home      Prior Functioning/Environment Level of Independence: Independent with assistive device(s)        Comments: Used walker a few weeks prior to surgery    OT Diagnosis: Generalized weakness   OT Problem List: Decreased strength;Decreased knowledge of use of DME or AE   OT Treatment/Interventions: Self-care/ADL training;DME and/or AE instruction;Patient/family education    OT Goals(Current goals can be found in the care plan section) Acute Rehab OT Goals Patient Stated Goal: get well and do for myself OT Goal Formulation: With patient Time For  Goal Achievement: 04/18/15  OT Frequency: Min 2X/week              End of Session Equipment Utilized During Treatment: Rolling walker  Activity Tolerance: Patient tolerated treatment well Patient left:     Time: [id number] OT Time Calculation (min): 57 min Charges:  OT General Charges $OT Visit: 1 Procedure OT Evaluation $Initial OT Evaluation Tier I: 1 Procedure OT Treatments $Self Care/Home Management : 38-52 mins G-Codes:    Payton Mccallum D May 04, 2015, 10:51  AM

## 2015-04-04 NOTE — Progress Notes (Signed)
Subjective: 1 Day Post-Op Procedure(s) (LRB): LEFT TOTAL KNEE ARTHROPLASTY (Left)  Activity level:  wbat Diet tolerance:  ok Voiding:  ok Patient reports pain as mild and moderate.    Objective: Vital signs in last 24 hours: Temp:  [97.4 F (36.3 C)-98.8 F (37.1 C)] 98.7 F (37.1 C) (04/20 0536) Pulse Rate:  [64-87] 65 (04/20 0536) Resp:  [13-24] 16 (04/20 0536) BP: (110-137)/(45-82) 132/82 mmHg (04/20 0536) SpO2:  [90 %-100 %] 95 % (04/20 0536)  Labs: No results for input(s): HGB in the last 72 hours. No results for input(s): WBC, RBC, HCT, PLT in the last 72 hours. No results for input(s): NA, K, CL, CO2, BUN, CREATININE, GLUCOSE, CALCIUM in the last 72 hours. No results for input(s): LABPT, INR in the last 72 hours.  Physical Exam:  Neurologically intact ABD soft Neurovascular intact Sensation intact distally Intact pulses distally Dorsiflexion/Plantar flexion intact Incision: dressing C/D/I and no drainage No cellulitis present Compartment soft  Assessment/Plan:  1 Day Post-Op Procedure(s) (LRB): LEFT TOTAL KNEE ARTHROPLASTY (Left) Advance diet Up with therapy Plan for discharge tomorrow Discharge home with home health if continuing to do well and cleared by PT. Continue on ASA 325mg  BID x 2 weeks post op. Will change dressing to aquacel tomorrow prior to d/c. Follow up in office 2 weeks post op.   [name], [place] Sachs 04/04/2015, 8:11 AM

## 2015-04-04 NOTE — Plan of Care (Signed)
Problem: Consults Goal: Diagnosis- Total Joint Replacement Primary Total Knee     

## 2015-04-04 NOTE — Progress Notes (Signed)
Patient stated that she would apply the CPAP when she was ready.  RT checked the CPAP and patient is using FFM with settings on auto titrate of 14 max and 4 min.  RT advised the patient to call if she needed any help with CPAP. RT will continue to monitor,.

## 2015-04-04 NOTE — Progress Notes (Signed)
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  PMH: fatigue, sleep apnea, depression, morbid obesity, hyperlipidemia, gastric bypass    PT Comments    Pt is making progress towards goals and increasing ambulation distance. Pt would benefit from continued PT to increase functional independence. Focus on progressive ambulation and stairs this afternoon. Continue with POC.  Follow Up Recommendations  Home health PT;Supervision for mobility/OOB     Equipment Recommendations  None recommended by PT    Recommendations for Other Services       Precautions / Restrictions Precautions Precautions: Knee Precaution Booklet Issued: Yes (comment) Precaution Comments: Reviewed with patient Required Braces or Orthoses: Knee Immobilizer - Left Knee Immobilizer - Left: On when out of bed or walking Restrictions Weight Bearing Restrictions: No    Mobility  Bed Mobility               General bed mobility comments: In chair before and after.  Transfers Overall transfer level: Needs assistance Equipment used: Rolling walker (2 wheeled) Transfers: Sit to/from Stand Sit to Stand: Min guard Stand pivot transfers: Min guard       General transfer comment: Min guard for safety. Cues for hand placement.  Ambulation/Gait Ambulation/Gait assistance: Min guard Ambulation Distance (Feet): 200 Feet Assistive device: Rolling walker (2 wheeled) Gait Pattern/deviations: Step-to pattern;Antalgic;Decreased stance time - left   Gait velocity interpretation: Below normal speed for age/gender General Gait Details: Cues for upright posture/forward head, WB through LEs, and to keep RW moving.   Stairs            Wheelchair Mobility    Modified Rankin (Stroke Patients Only)       Balance                                    Cognition  Arousal/Alertness: Awake/alert Behavior During Therapy: WFL for tasks assessed/performed Overall Cognitive Status: Within Functional Limits for tasks assessed                      Exercises Total Joint Exercises Quad Sets: AROM;Left;10 reps Heel Slides: AAROM;Left;5 reps Hip ABduction/ADduction: AAROM;Left;10 reps Straight Leg Raises: AAROM;Left;5 reps    General Comments        Pertinent Vitals/Pain Pain Assessment: 0-10 Pain Score: 6  Pain Location: L knee Pain Descriptors / Indicators: Sore Pain Intervention(s): Monitored during session;Ice applied    Home Living Family/patient expects to be discharged to:: Private residence Living Arrangements: Spouse/significant other Available Help at Discharge: Family Type of Home: House Home Access: Stairs to enter Entrance Stairs-Rails: None Home Layout: One level Home Equipment: Environmental consultant - 2 wheels;Cane - single point;Tub bench Additional Comments: Was using cane for a few weeks prior to surgery, also has walker at home    Prior Function Level of Independence: Independent with assistive device(s)      Comments: Used walker a few weeks prior to surgery   PT Goals (current goals can now be found in the care plan section) Acute Rehab PT Goals Patient Stated Goal: get well and do for myself Progress towards PT goals: Progressing toward goals    Frequency  7X/week    PT Plan Current plan remains appropriate    Co-evaluation             End  of Session Equipment Utilized During Treatment: Gait belt;Left knee immobilizer Activity Tolerance: Patient tolerated treatment well Patient left: in chair;with call bell/phone within reach     Time: [id number] PT Time Calculation (min) (ACUTE ONLY): 27 min  Charges:                       G CodesRubye Oaks, [place] 04/04/2015, 11:33 AM

## 2015-04-05 MED ORDER — METHOCARBAMOL 500 MG PO TABS: 500.0000 mg | ORAL_TABLET | Freq: Four times a day (QID) | ORAL | Status: DC | PRN

## 2015-04-05 MED ORDER — ASPIRIN 325 MG PO TBEC: 325.0000 mg | DELAYED_RELEASE_TABLET | Freq: Two times a day (BID) | ORAL | Status: DC

## 2015-04-05 MED ORDER — HYDROCODONE-ACETAMINOPHEN 10-325 MG PO TABS: 1.0000 | ORAL_TABLET | ORAL | Status: DC | PRN

## 2015-04-05 NOTE — Discharge Summary (Signed)
Patient ID: [name] MRN: [id number] DOB/AGE: Jul 18, 1946 69 y.o.  Admit date: 04/03/2015 Discharge date: 04/05/2015  Admission Diagnoses:  Principal Problem:   Primary osteoarthritis of left knee Active Problems:   Obstructive sleep apnea   Morbid obesity   Primary osteoarthritis of knee   Discharge Diagnoses:  Same  Past Medical History  Diagnosis Date  . Sleep apnea     uses CPAP  . H/O hiatal hernia     surgery for hernia  . Arthritis     generalized  . Leg cramps     takes Flexeril daily as needed  . Restless leg syndrome     takes Requip daily  . Vitamin D deficiency   . Osteoporosis   . Tubular adenoma of colon   . Gestational diabetes   . Diverticulosis     benign  . Dyslipidemia   . Atrophic vaginitis   . DJD (degenerative joint disease)   . Vertigo   . 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 Depression     takes Cymbalta daily  . Insomnia     takes Ambien nightly  . GERD (gastroesophageal reflux disease)     takes Nexium daily  . Dysrhythmia   . Asthma   . Pneumonia 91yrs ago    hx of  . History of bronchitis 1 yr ago  . Headache(784.0)     takes Imitrex daily as needed and Bisoprolol daily;last migraine was about 2wks ago  . Peripheral neuropathy     takes Gabapentin daily  . Joint pain   . Joint swelling   . Back pain     DDD/stenosis  . Family history of GI bleeding   . Nocturia   . 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 History of shingles     Surgeries: Procedure(s): LEFT TOTAL KNEE ARTHROPLASTY on 04/03/2015   Consultants:    Discharged Condition: Improved  Hospital Course: [name] is an 69 y.o. female who was admitted 04/03/2015 for operative treatment ofPrimary osteoarthritis of left knee. Patient has severe unremitting pain that affects sleep, daily activities, and work/hobbies. After pre-op clearance the patient was taken to the operating room on 04/03/2015 and underwent  Procedure(s): LEFT TOTAL KNEE  ARTHROPLASTY.    Patient was given perioperative antibiotics: Anti-infectives    Start     Dose/Rate Route Frequency Ordered Stop   04/03/15 1530  ceFAZolin (ANCEF) IVPB 2 g/50 mL premix     2 g 100 mL/hr over 30 Minutes Intravenous Every 6 hours 04/03/15 1318 04/03/15 2253   04/03/15 1330  quiNINE (QUALAQUIN) capsule 324 mg     324 mg Oral 2 times daily 04/03/15 1318     04/03/15 0600  ceFAZolin (ANCEF) IVPB 2 g/50 mL premix     2 g 100 mL/hr over 30 Minutes Intravenous On call to O.R. 04/02/15 1300 04/03/15 0940       Patient was given sequential compression devices, early ambulation, and chemoprophylaxis to prevent DVT.  Patient benefited maximally from hospital stay and there were no complications.    Recent vital signs: Patient Vitals for the past 24 hrs:  BP Temp Temp src Pulse Resp SpO2  04/05/15 0800 - - - - 14 94 %  04/05/15 0454 (!) 119/53 mmHg 98.2 F (36.8 C) Axillary 80 - 94 %  04/05/15 0311 - - - - 14 91 %  04/04/15 2326 - - - - 12 92 %  04/04/15 2000 - - - -  14 99 %  04/04/15 1948 138/67 mmHg 98.5 F (36.9 C) Oral 86 - 99 %  04/04/15 1600 - - - - 16 -     Recent laboratory studies: No results for input(s): WBC, HGB, HCT, PLT, NA, K, CL, CO2, BUN, CREATININE, GLUCOSE, INR, CALCIUM in the last 72 hours.  Invalid input(s): PT, 2   Discharge Medications:     Medication List    TAKE these medications        ALLEGRA ALLERGY PO  Take by mouth.     amphetamine-dextroamphetamine 10 MG tablet  Commonly known as:  ADDERALL  Take 10 mg by mouth daily with breakfast.     aspirin 325 MG EC tablet  Take 1 tablet (325 mg total) by mouth 2 (two) times daily after a meal.     bisoprolol 10 MG tablet  Commonly known as:  ZEBETA  Take 10 mg by mouth 2 (two) times daily.     cyclobenzaprine 10 MG tablet  Commonly known as:  FLEXERIL  Take 10 mg by mouth daily as needed for muscle spasms (Usually take before bedtime).     DULERA 200-5 MCG/ACT Aero  Generic drug:   mometasone-formoterol  Inhale 2 puffs into the lungs 2 (two) times daily as needed for wheezing or shortness of breath.     DULoxetine 30 MG capsule  Commonly known as:  CYMBALTA  Take 30 mg by mouth 2 (two) times daily.     esomeprazole 40 MG capsule  Commonly known as:  NEXIUM  Take 40 mg by mouth 2 (two) times daily before a meal.     fluticasone 50 MCG/ACT nasal spray  Commonly known as:  FLONASE  Place 2 sprays into both nostrils daily as needed for allergies or rhinitis.     gabapentin 300 MG capsule  Commonly known as:  NEURONTIN  Take 300 mg by mouth 2 (two) times daily.     HYDROcodone-acetaminophen 10-325 MG per tablet  Commonly known as:  NORCO  Take 1-2 tablets by mouth every 4 (four) hours as needed for moderate pain.     methocarbamol 500 MG tablet  Commonly known as:  ROBAXIN  Take 1 tablet (500 mg total) by mouth every 6 (six) hours as needed for muscle spasms.     quiNINE 324 MG capsule  Commonly known as:  QUALAQUIN  Take one after lunch and one after dinner po.     rOPINIRole 1 MG tablet  Commonly known as:  REQUIP  Take 1 mg by mouth at bedtime as needed and may repeat dose one time if needed.     SUMAtriptan 100 MG tablet  Commonly known as:  IMITREX  Take 1 tablet (100 mg total) by mouth once as needed for migraine or headache. May repeat in 2 hours if headache persists or recurs.     zolpidem 10 MG tablet  Commonly known as:  AMBIEN  Take 10 mg by mouth at bedtime.        Diagnostic Studies: Dg Chest 2 View  03/22/2015   CLINICAL DATA:  Preoperative examination prior to knee replacement; history of asthma and sleep apnea  EXAM: CHEST  2 VIEW  COMPARISON:  Report of a chest x-ray dated May 26, 2011  FINDINGS: The lungs are adequately inflated. There is no focal infiltrate. There is no pleural effusion. The heart and pulmonary vascularity are normal. The mediastinum is normal in width. There is mild degenerative disc space narrowing at multiple  thoracic  levels with calcification of the anterior longitudinal ligament.  IMPRESSION: There is no active cardiopulmonary disease.   Electronically Signed   By: David  Martinique   On: 03/22/2015 16:11   Mm Screening Breast Tomo Bilateral  03/14/2015   CLINICAL DATA:  Screening.  EXAM: DIGITAL SCREENING BILATERAL MAMMOGRAM WITH 3D TOMO WITH CAD  COMPARISON:  Previous exam(s).  ACR Breast Density Category a: The breast tissue is almost entirely fatty.  FINDINGS: There are no findings suspicious for malignancy. Images were processed with CAD.  IMPRESSION: No mammographic evidence of malignancy. A result letter of this screening mammogram will be mailed directly to the patient.  RECOMMENDATION: Screening mammogram in one year. (Code:[id number])  BI-RADS CATEGORY  1: Negative.   Electronically Signed   By: Lovey Newcomer M.D.   On: 03/14/2015 07:37    Disposition: 01-Home or Self Care      Discharge Instructions    Call MD / Call 911    Complete by:  As directed   If you experience chest pain or shortness of breath, CALL 911 and be transported to the hospital emergency room.  If you develope a fever above 101 F, pus (white drainage) or increased drainage or redness at the wound, or calf pain, call your surgeon's office.     Constipation Prevention    Complete by:  As directed   Drink plenty of fluids.  Prune juice may be helpful.  You may use a stool softener, such as Colace (over the counter) 100 mg twice a day.  Use MiraLax (over the counter) for constipation as needed.     Diet - low sodium heart healthy    Complete by:  As directed      Discharge instructions    Complete by:  As directed   INSTRUCTIONS AFTER JOINT REPLACEMENT   Remove items at home which could result in a fall. This includes throw rugs or furniture in walking pathways ICE to the affected joint every three hours while awake for 30 minutes at a time, for at least the first 3-5 days, and then as needed for pain and swelling.  Continue to  use ice for pain and swelling. You may notice swelling that will progress down to the foot and ankle.  This is normal after surgery.  Elevate your leg when you are not up walking on it.   Continue to use the breathing machine you got in the hospital (incentive spirometer) which will help keep your temperature down.  It is common for your temperature to cycle up and down following surgery, especially at night when you are not up moving around and exerting yourself.  The breathing machine keeps your lungs expanded and your temperature down.   DIET:  As you were doing prior to hospitalization, we recommend a well-balanced diet.  DRESSING / WOUND CARE / SHOWERING  Keep dressing clean and dry until follow up.  ACTIVITY  Increase activity slowly as tolerated, but follow the weight bearing instructions below.   No driving for 6 weeks or until further direction given by your physician.  You cannot drive while taking narcotics.  No lifting or carrying greater than 10 lbs. until further directed by your surgeon. Avoid periods of inactivity such as sitting longer than an hour when not asleep. This helps prevent blood clots.  You may return to work once you are authorized by your doctor.     WEIGHT BEARING   Weight bearing as tolerated with assist device (walker, cane, etc)  as directed, use it as long as suggested by your surgeon or therapist, typically at least 4-6 weeks.   EXERCISES  Results after joint replacement surgery are often greatly improved when you follow the exercise, range of motion and muscle strengthening exercises prescribed by your doctor. Safety measures are also important to protect the joint from further injury. Any time any of these exercises cause you to have increased pain or swelling, decrease what you are doing until you are comfortable again and then slowly increase them. If you have problems or questions, call your caregiver or physical therapist for advice.    Rehabilitation is important following a joint replacement. After just a few days of immobilization, the muscles of the leg can become weakened and shrink (atrophy).  These exercises are designed to build up the tone and strength of the thigh and leg muscles and to improve motion. Often times heat used for twenty to thirty minutes before working out will loosen up your tissues and help with improving the range of motion but do not use heat for the first two weeks following surgery (sometimes heat can increase post-operative swelling).   These exercises can be done on a training (exercise) mat, on the floor, on a table or on a bed. Use whatever works the best and is most comfortable for you.    Use music or television while you are exercising so that the exercises are a pleasant break in your day. This will make your life better with the exercises acting as a break in your routine that you can look forward to.   Perform all exercises about fifteen times, three times per day or as directed.  You should exercise both the operative leg and the other leg as well.   Exercises include:   Quad Sets - Tighten up the muscle on the front of the thigh (Quad) and hold for 5-10 seconds.   Straight Leg Raises - With your knee straight (if you were given a brace, keep it on), lift the leg to 60 degrees, hold for 3 seconds, and slowly lower the leg.  Perform this exercise against resistance later as your leg gets stronger.  Leg Slides: Lying on your back, slowly slide your foot toward your buttocks, bending your knee up off the floor (only go as far as is comfortable). Then slowly slide your foot back down until your leg is flat on the floor again.  Angel Wings: Lying on your back spread your legs to the side as far apart as you can without causing discomfort.  Hamstring Strength:  Lying on your back, push your heel against the floor with your leg straight by tightening up the muscles of your buttocks.  Repeat, but this  time bend your knee to a comfortable angle, and push your heel against the floor.  You may put a pillow under the heel to make it more comfortable if necessary.   A rehabilitation program following joint replacement surgery can speed recovery and prevent re-injury in the future due to weakened muscles. Contact your doctor or a physical therapist for more information on knee rehabilitation.    CONSTIPATION  Constipation is defined medically as fewer than three stools per week and severe constipation as less than one stool per week.  Even if you have a regular bowel pattern at home, your normal regimen is likely to be disrupted due to multiple reasons following surgery.  Combination of anesthesia, postoperative narcotics, change in appetite and fluid intake all can  affect your bowels.   YOU MUST use at least one of the following options; they are listed in order of increasing strength to get the job done.  They are all available over the counter, and you may need to use some, POSSIBLY even all of these options:    Drink plenty of fluids (prune juice may be helpful) and high fiber foods Colace 100 mg by mouth twice a day  Senokot for constipation as directed and as needed Dulcolax (bisacodyl), take with full glass of water  Miralax (polyethylene glycol) once or twice a day as needed.  If you have tried all these things and are unable to have a bowel movement in the first 3-4 days after surgery call either your surgeon or your primary doctor.    If you experience loose stools or diarrhea, hold the medications until you stool forms back up.  If your symptoms do not get better within 1 week or if they get worse, check with your doctor.  If you experience "the worst abdominal pain ever" or develop nausea or vomiting, please contact the office immediately for further recommendations for treatment.   ITCHING:  If you experience itching with your medications, try taking only a single pain pill, or even  half a pain pill at a time.  You can also use Benadryl over the counter for itching or also to help with sleep.   TED HOSE STOCKINGS:  Use stockings on both legs until for at least 2 weeks or as directed by physician office. They may be removed at night for sleeping.  MEDICATIONS:  See your medication summary on the "After Visit Summary" that nursing will review with you.  You may have some home medications which will be placed on hold until you complete the course of blood thinner medication.  It is important for you to complete the blood thinner medication as prescribed.  PRECAUTIONS:  If you experience chest pain or shortness of breath - call 911 immediately for transfer to the hospital emergency department.   If you develop a fever greater that 101 F, purulent drainage from wound, increased redness or drainage from wound, foul odor from the wound/dressing, or calf pain - CONTACT YOUR SURGEON.                                                   FOLLOW-UP APPOINTMENTS:  If you do not already have a post-op appointment, please call the office for an appointment to be seen by your surgeon.  Guidelines for how soon to be seen are listed in your "After Visit Summary", but are typically between 1-4 weeks after surgery.  OTHER INSTRUCTIONS:   Knee Replacement:  Do not place pillow under knee, focus on keeping the knee straight while resting. CPM instructions: 0-90 degrees, 2 hours in the morning, 2 hours in the afternoon, and 2 hours in the evening. Place foam block, curve side up under heel at all times except when in CPM or when walking.  DO NOT modify, tear, cut, or change the foam block in any way.  MAKE SURE YOU:  Understand these instructions.  Get help right away if you are not doing well or get worse.    Thank you for letting us be a part of your medical care team.  It is a privilege we respect greatly.  We hope these instructions will help you stay on track for a fast and full recovery!      Increase activity slowly as tolerated    Complete by:  As directed            Follow-up Information    Follow up with Hessie Dibble, MD. Schedule an appointment as soon as possible for a visit in 2 weeks.   Specialty:  Orthopedic Surgery   Contact information:   [place] [address] [phone]        Signed: Rich Fuchs 04/05/2015, 2:59 PM

## 2015-04-05 NOTE — Progress Notes (Signed)
Discharge instructions, Medications and follow up appointments reviewed with the patient. New medications reviewed with the patient.  follow up appointment and home care reviewed with the patient Patient voices understanding to teaching. To door via wheelchair.  Home via [place] with her husband driving.

## 2015-04-05 NOTE — Progress Notes (Signed)
Physical Therapy Treatment Patient Details Name: [name] MRN: [id number] DOB: 08-04-1946 Today's Date: 04/05/2015    History of Present Illness Pt s/p L TKA on 04/03/2015.  PMH: fatigue, sleep apnea, depression, morbid obesity, hyperlipidemia, gastric bypass    PT Comments    Pt is making progress towards goals and increasing functional independence. Able to complete stair training this AM. Pt felt comfortable with going up/down stairs and confident that daughter will be able to assist with steps at home. Pt safe to D/C from a mobility standpoint based on progression towards goals set on PT eval.  Follow Up Recommendations  Home health PT;Supervision for mobility/OOB     Equipment Recommendations  None recommended by PT    Recommendations for Other Services       Precautions / Restrictions Precautions Precautions: Knee Restrictions LLE Weight Bearing: Weight bearing as tolerated    Mobility  Bed Mobility               General bed mobility comments: In chair before and after.  Transfers Overall transfer level: Modified independent Equipment used: Rolling walker (2 wheeled) Transfers: Sit to/from Stand           General transfer comment: Able to perform with safe technique.  Ambulation/Gait Ambulation/Gait assistance: Min guard Ambulation Distance (Feet): 250 Feet Assistive device: Rolling walker (2 wheeled) Gait Pattern/deviations: Step-through pattern;Decreased stance time - left;Antalgic;Trunk flexed   Gait velocity interpretation: Below normal speed for age/gender General Gait Details: Cues for upright posture and to bend knees. Able to ambulate without KI and no knee buckling noted.   Stairs Stairs: Yes Stairs assistance: Min assist Stair Management: No rails;Step to pattern;Backwards;With walker Number of Stairs: 2 General stair comments: Able to perform with safe technique/proper sequencing.  Wheelchair Mobility    Modified Rankin (Stroke  Patients Only)       Balance                                    Cognition Arousal/Alertness: Awake/alert Behavior During Therapy: WFL for tasks assessed/performed Overall Cognitive Status: Within Functional Limits for tasks assessed                      Exercises Total Joint Exercises Quad Sets: AROM;Left;10 reps Heel Slides: AAROM;Left;10 reps Hip ABduction/ADduction: AAROM;Left;10 reps Straight Leg Raises: AAROM;Left;10 reps    General Comments        Pertinent Vitals/Pain Pain Assessment: 0-10 Pain Score: 6  Pain Location: L knee Pain Descriptors / Indicators: Sore Pain Intervention(s): Monitored during session    Home Living Family/patient expects to be discharged to:: Private residence Living Arrangements: Spouse/significant other                  Prior Function            PT Goals (current goals can now be found in the care plan section) Progress towards PT goals: Progressing toward goals    Frequency  7X/week    PT Plan Current plan remains appropriate    Co-evaluation             End of Session Equipment Utilized During Treatment: Gait belt Activity Tolerance: Patient tolerated treatment well Patient left: in chair;with call bell/phone within reach     Time: 0754-0825 PT Time Calculation (min) (ACUTE ONLY): 31 min  Charges:  G CodesRubye Oaks, [place] 04/05/2015, 8:40 AM

## 2015-04-06 DIAGNOSIS — F329 Major depressive disorder, single episode, unspecified: Secondary | ICD-10-CM | POA: Diagnosis not present

## 2015-04-06 DIAGNOSIS — Z471 Aftercare following joint replacement surgery: Secondary | ICD-10-CM | POA: Diagnosis not present

## 2015-04-06 DIAGNOSIS — M4800 Spinal stenosis, site unspecified: Secondary | ICD-10-CM | POA: Diagnosis not present

## 2015-04-06 DIAGNOSIS — G629 Polyneuropathy, unspecified: Secondary | ICD-10-CM | POA: Diagnosis not present

## 2015-04-06 DIAGNOSIS — M1990 Unspecified osteoarthritis, unspecified site: Secondary | ICD-10-CM | POA: Diagnosis not present

## 2015-04-06 DIAGNOSIS — G2581 Restless legs syndrome: Secondary | ICD-10-CM | POA: Diagnosis not present

## 2015-04-06 NOTE — Care Management Note (Signed)
CARE MANAGEMENT NOTE 04/06/2015  Patient:  [name], [name]   Account Number:  [id number]  Date Initiated:  04/05/2015  Documentation initiated by:  Ricki Miller  Subjective/Objective Assessment:   69 yr old female admitted with DJD of left knee. Patient underwent a left total knee arthroplasty.     Action/Plan:   PAtient was preoperatiely setup with [facility], no changes. Has family support at discharge. Patient has RW, 3in1.   Anticipated DC Date:  04/05/2015   Anticipated DC Plan:  [place]  CM consult      [facility] Choice  HOME HEALTH  DURABLE MEDICAL EQUIPMENT   Choice offered to / List presented to:  C-1 Patient   DME arranged  CPM      DME agency  [employer]     [facility] arranged  HH-2 PT      [facility]   Status of service:  Completed, signed off Medicare Important Message given?  NA - LOS <3 / Initial given by admissions (If response is "NO", the following Medicare IM given date fields will be blank) Date Medicare IM given:   Medicare IM given by:   Date Additional Medicare IM given:   Additional Medicare IM given by:    Discharge Disposition:  [place]  Per UR Regulation:  Reviewed for med. necessity/level of care/duration of stay

## 2015-04-09 ENCOUNTER — Encounter: Payer: Self-pay | Admitting: Neurology

## 2015-04-09 DIAGNOSIS — M4800 Spinal stenosis, site unspecified: Secondary | ICD-10-CM | POA: Diagnosis not present

## 2015-04-09 DIAGNOSIS — G2581 Restless legs syndrome: Secondary | ICD-10-CM | POA: Diagnosis not present

## 2015-04-09 DIAGNOSIS — G629 Polyneuropathy, unspecified: Secondary | ICD-10-CM | POA: Diagnosis not present

## 2015-04-09 DIAGNOSIS — Z471 Aftercare following joint replacement surgery: Secondary | ICD-10-CM | POA: Diagnosis not present

## 2015-04-09 DIAGNOSIS — F329 Major depressive disorder, single episode, unspecified: Secondary | ICD-10-CM | POA: Diagnosis not present

## 2015-04-09 DIAGNOSIS — M1990 Unspecified osteoarthritis, unspecified site: Secondary | ICD-10-CM | POA: Diagnosis not present

## 2015-04-11 DIAGNOSIS — F329 Major depressive disorder, single episode, unspecified: Secondary | ICD-10-CM | POA: Diagnosis not present

## 2015-04-11 DIAGNOSIS — G629 Polyneuropathy, unspecified: Secondary | ICD-10-CM | POA: Diagnosis not present

## 2015-04-11 DIAGNOSIS — M4800 Spinal stenosis, site unspecified: Secondary | ICD-10-CM | POA: Diagnosis not present

## 2015-04-11 DIAGNOSIS — Z471 Aftercare following joint replacement surgery: Secondary | ICD-10-CM | POA: Diagnosis not present

## 2015-04-11 DIAGNOSIS — M1990 Unspecified osteoarthritis, unspecified site: Secondary | ICD-10-CM | POA: Diagnosis not present

## 2015-04-11 DIAGNOSIS — G2581 Restless legs syndrome: Secondary | ICD-10-CM | POA: Diagnosis not present

## 2015-04-13 DIAGNOSIS — G629 Polyneuropathy, unspecified: Secondary | ICD-10-CM | POA: Diagnosis not present

## 2015-04-13 DIAGNOSIS — M1990 Unspecified osteoarthritis, unspecified site: Secondary | ICD-10-CM | POA: Diagnosis not present

## 2015-04-13 DIAGNOSIS — Z471 Aftercare following joint replacement surgery: Secondary | ICD-10-CM | POA: Diagnosis not present

## 2015-04-13 DIAGNOSIS — G2581 Restless legs syndrome: Secondary | ICD-10-CM | POA: Diagnosis not present

## 2015-04-13 DIAGNOSIS — F329 Major depressive disorder, single episode, unspecified: Secondary | ICD-10-CM | POA: Diagnosis not present

## 2015-04-13 DIAGNOSIS — M4800 Spinal stenosis, site unspecified: Secondary | ICD-10-CM | POA: Diagnosis not present

## 2015-04-16 DIAGNOSIS — Z96653 Presence of artificial knee joint, bilateral: Secondary | ICD-10-CM | POA: Diagnosis not present

## 2015-04-16 DIAGNOSIS — Z471 Aftercare following joint replacement surgery: Secondary | ICD-10-CM | POA: Diagnosis not present

## 2015-04-17 DIAGNOSIS — G2581 Restless legs syndrome: Secondary | ICD-10-CM | POA: Diagnosis not present

## 2015-04-17 DIAGNOSIS — G629 Polyneuropathy, unspecified: Secondary | ICD-10-CM | POA: Diagnosis not present

## 2015-04-17 DIAGNOSIS — F329 Major depressive disorder, single episode, unspecified: Secondary | ICD-10-CM | POA: Diagnosis not present

## 2015-04-17 DIAGNOSIS — M4800 Spinal stenosis, site unspecified: Secondary | ICD-10-CM | POA: Diagnosis not present

## 2015-04-17 DIAGNOSIS — Z471 Aftercare following joint replacement surgery: Secondary | ICD-10-CM | POA: Diagnosis not present

## 2015-04-17 DIAGNOSIS — M1990 Unspecified osteoarthritis, unspecified site: Secondary | ICD-10-CM | POA: Diagnosis not present

## 2015-04-18 DIAGNOSIS — F329 Major depressive disorder, single episode, unspecified: Secondary | ICD-10-CM | POA: Diagnosis not present

## 2015-04-18 DIAGNOSIS — M1990 Unspecified osteoarthritis, unspecified site: Secondary | ICD-10-CM | POA: Diagnosis not present

## 2015-04-18 DIAGNOSIS — G2581 Restless legs syndrome: Secondary | ICD-10-CM | POA: Diagnosis not present

## 2015-04-18 DIAGNOSIS — M4800 Spinal stenosis, site unspecified: Secondary | ICD-10-CM | POA: Diagnosis not present

## 2015-04-18 DIAGNOSIS — G629 Polyneuropathy, unspecified: Secondary | ICD-10-CM | POA: Diagnosis not present

## 2015-04-18 DIAGNOSIS — Z471 Aftercare following joint replacement surgery: Secondary | ICD-10-CM | POA: Diagnosis not present

## 2015-04-20 DIAGNOSIS — G629 Polyneuropathy, unspecified: Secondary | ICD-10-CM | POA: Diagnosis not present

## 2015-04-20 DIAGNOSIS — Z471 Aftercare following joint replacement surgery: Secondary | ICD-10-CM | POA: Diagnosis not present

## 2015-04-20 DIAGNOSIS — F329 Major depressive disorder, single episode, unspecified: Secondary | ICD-10-CM | POA: Diagnosis not present

## 2015-04-20 DIAGNOSIS — M1990 Unspecified osteoarthritis, unspecified site: Secondary | ICD-10-CM | POA: Diagnosis not present

## 2015-04-20 DIAGNOSIS — M4800 Spinal stenosis, site unspecified: Secondary | ICD-10-CM | POA: Diagnosis not present

## 2015-04-20 DIAGNOSIS — G2581 Restless legs syndrome: Secondary | ICD-10-CM | POA: Diagnosis not present

## 2015-04-24 DIAGNOSIS — M47816 Spondylosis without myelopathy or radiculopathy, lumbar region: Secondary | ICD-10-CM | POA: Diagnosis not present

## 2015-04-27 DIAGNOSIS — Z96652 Presence of left artificial knee joint: Secondary | ICD-10-CM | POA: Diagnosis not present

## 2015-04-30 DIAGNOSIS — Z471 Aftercare following joint replacement surgery: Secondary | ICD-10-CM | POA: Diagnosis not present

## 2015-04-30 DIAGNOSIS — R2689 Other abnormalities of gait and mobility: Secondary | ICD-10-CM | POA: Diagnosis not present

## 2015-05-02 DIAGNOSIS — R2689 Other abnormalities of gait and mobility: Secondary | ICD-10-CM | POA: Diagnosis not present

## 2015-05-02 DIAGNOSIS — Z471 Aftercare following joint replacement surgery: Secondary | ICD-10-CM | POA: Diagnosis not present

## 2015-05-04 DIAGNOSIS — R2689 Other abnormalities of gait and mobility: Secondary | ICD-10-CM | POA: Diagnosis not present

## 2015-05-04 DIAGNOSIS — Z471 Aftercare following joint replacement surgery: Secondary | ICD-10-CM | POA: Diagnosis not present

## 2015-05-07 DIAGNOSIS — Z471 Aftercare following joint replacement surgery: Secondary | ICD-10-CM | POA: Diagnosis not present

## 2015-05-07 DIAGNOSIS — R2689 Other abnormalities of gait and mobility: Secondary | ICD-10-CM | POA: Diagnosis not present

## 2015-05-09 DIAGNOSIS — R2689 Other abnormalities of gait and mobility: Secondary | ICD-10-CM | POA: Diagnosis not present

## 2015-05-09 DIAGNOSIS — M25562 Pain in left knee: Secondary | ICD-10-CM | POA: Diagnosis not present

## 2015-05-09 DIAGNOSIS — Z471 Aftercare following joint replacement surgery: Secondary | ICD-10-CM | POA: Diagnosis not present

## 2015-05-09 DIAGNOSIS — M25551 Pain in right hip: Secondary | ICD-10-CM | POA: Diagnosis not present

## 2015-05-11 DIAGNOSIS — R2689 Other abnormalities of gait and mobility: Secondary | ICD-10-CM | POA: Diagnosis not present

## 2015-05-11 DIAGNOSIS — Z471 Aftercare following joint replacement surgery: Secondary | ICD-10-CM | POA: Diagnosis not present

## 2015-05-15 DIAGNOSIS — R2689 Other abnormalities of gait and mobility: Secondary | ICD-10-CM | POA: Diagnosis not present

## 2015-05-15 DIAGNOSIS — Z471 Aftercare following joint replacement surgery: Secondary | ICD-10-CM | POA: Diagnosis not present

## 2015-05-17 DIAGNOSIS — Z471 Aftercare following joint replacement surgery: Secondary | ICD-10-CM | POA: Diagnosis not present

## 2015-05-17 DIAGNOSIS — R2689 Other abnormalities of gait and mobility: Secondary | ICD-10-CM | POA: Diagnosis not present

## 2015-05-17 DIAGNOSIS — M25551 Pain in right hip: Secondary | ICD-10-CM | POA: Diagnosis not present

## 2015-05-21 DIAGNOSIS — M25551 Pain in right hip: Secondary | ICD-10-CM | POA: Diagnosis not present

## 2015-05-21 DIAGNOSIS — R2689 Other abnormalities of gait and mobility: Secondary | ICD-10-CM | POA: Diagnosis not present

## 2015-05-21 DIAGNOSIS — Z471 Aftercare following joint replacement surgery: Secondary | ICD-10-CM | POA: Diagnosis not present

## 2015-05-23 DIAGNOSIS — R2689 Other abnormalities of gait and mobility: Secondary | ICD-10-CM | POA: Diagnosis not present

## 2015-05-23 DIAGNOSIS — Z471 Aftercare following joint replacement surgery: Secondary | ICD-10-CM | POA: Diagnosis not present

## 2015-05-23 DIAGNOSIS — M25551 Pain in right hip: Secondary | ICD-10-CM | POA: Diagnosis not present

## 2015-05-24 DIAGNOSIS — R2689 Other abnormalities of gait and mobility: Secondary | ICD-10-CM | POA: Diagnosis not present

## 2015-05-24 DIAGNOSIS — Z471 Aftercare following joint replacement surgery: Secondary | ICD-10-CM | POA: Diagnosis not present

## 2015-05-24 DIAGNOSIS — M25551 Pain in right hip: Secondary | ICD-10-CM | POA: Diagnosis not present

## 2015-05-28 DIAGNOSIS — M25551 Pain in right hip: Secondary | ICD-10-CM | POA: Diagnosis not present

## 2015-05-28 DIAGNOSIS — R2689 Other abnormalities of gait and mobility: Secondary | ICD-10-CM | POA: Diagnosis not present

## 2015-05-28 DIAGNOSIS — Z471 Aftercare following joint replacement surgery: Secondary | ICD-10-CM | POA: Diagnosis not present

## 2015-05-30 DIAGNOSIS — Z471 Aftercare following joint replacement surgery: Secondary | ICD-10-CM | POA: Diagnosis not present

## 2015-05-30 DIAGNOSIS — R2689 Other abnormalities of gait and mobility: Secondary | ICD-10-CM | POA: Diagnosis not present

## 2015-05-30 DIAGNOSIS — M25551 Pain in right hip: Secondary | ICD-10-CM | POA: Diagnosis not present

## 2015-05-30 DIAGNOSIS — Z96653 Presence of artificial knee joint, bilateral: Secondary | ICD-10-CM | POA: Diagnosis not present

## 2015-05-31 DIAGNOSIS — M25551 Pain in right hip: Secondary | ICD-10-CM | POA: Diagnosis not present

## 2015-05-31 DIAGNOSIS — Z471 Aftercare following joint replacement surgery: Secondary | ICD-10-CM | POA: Diagnosis not present

## 2015-05-31 DIAGNOSIS — R2689 Other abnormalities of gait and mobility: Secondary | ICD-10-CM | POA: Diagnosis not present

## 2015-06-05 DIAGNOSIS — M25551 Pain in right hip: Secondary | ICD-10-CM | POA: Diagnosis not present

## 2015-06-05 DIAGNOSIS — Z471 Aftercare following joint replacement surgery: Secondary | ICD-10-CM | POA: Diagnosis not present

## 2015-06-05 DIAGNOSIS — R2689 Other abnormalities of gait and mobility: Secondary | ICD-10-CM | POA: Diagnosis not present

## 2015-06-07 DIAGNOSIS — Z471 Aftercare following joint replacement surgery: Secondary | ICD-10-CM | POA: Diagnosis not present

## 2015-06-07 DIAGNOSIS — R2689 Other abnormalities of gait and mobility: Secondary | ICD-10-CM | POA: Diagnosis not present

## 2015-06-07 DIAGNOSIS — M25551 Pain in right hip: Secondary | ICD-10-CM | POA: Diagnosis not present

## 2015-06-11 DIAGNOSIS — Z471 Aftercare following joint replacement surgery: Secondary | ICD-10-CM | POA: Diagnosis not present

## 2015-06-11 DIAGNOSIS — R2689 Other abnormalities of gait and mobility: Secondary | ICD-10-CM | POA: Diagnosis not present

## 2015-06-11 DIAGNOSIS — M25551 Pain in right hip: Secondary | ICD-10-CM | POA: Diagnosis not present

## 2015-06-25 DIAGNOSIS — R2689 Other abnormalities of gait and mobility: Secondary | ICD-10-CM | POA: Diagnosis not present

## 2015-06-25 DIAGNOSIS — M25551 Pain in right hip: Secondary | ICD-10-CM | POA: Diagnosis not present

## 2015-06-25 DIAGNOSIS — Z471 Aftercare following joint replacement surgery: Secondary | ICD-10-CM | POA: Diagnosis not present

## 2015-06-27 DIAGNOSIS — M7071 Other bursitis of hip, right hip: Secondary | ICD-10-CM | POA: Diagnosis not present

## 2015-07-23 DIAGNOSIS — M7071 Other bursitis of hip, right hip: Secondary | ICD-10-CM | POA: Diagnosis not present

## 2015-08-15 ENCOUNTER — Other Ambulatory Visit: Payer: Self-pay | Admitting: Neurology

## 2015-08-15 ENCOUNTER — Other Ambulatory Visit: Payer: Self-pay

## 2015-08-15 ENCOUNTER — Telehealth: Payer: Self-pay | Admitting: Neurology

## 2015-08-15 MED ORDER — ROPINIROLE HCL 1 MG PO TABS: 1.0000 mg | ORAL_TABLET | Freq: Every evening | ORAL | Status: DC | PRN

## 2015-08-15 NOTE — Telephone Encounter (Signed)
Patient is calling to get a new Rx called in for rOPINIRole (REQUIP) 1 MG tablet. The patient states she takes 2 pills at night for restless legs. Please call to [facility] @ the corner of [address].  The patient also needs a call back to discuss her Ferritin level. Thank you.

## 2015-08-15 NOTE — Telephone Encounter (Signed)
Spoke to pt. Made an appt with her tomorrow 9/1 at 8:30 to discuss cpap and RLS. Dr. Brett Fairy said it was ok to reorder requip for. I advised the pt that her requip was sent in to her pharmacy. Pt verbalized understanding to arrive 15 minutes early to her appt tomorrow and to bring her cpap.

## 2015-08-15 NOTE — Progress Notes (Signed)
Received verbal order from Dr. Brett Fairy to reorder requip 1mg  by mouth qhs and another tablet prn. Order placed.

## 2015-08-16 ENCOUNTER — Encounter: Payer: Self-pay | Admitting: Neurology

## 2015-08-16 ENCOUNTER — Ambulatory Visit (INDEPENDENT_AMBULATORY_CARE_PROVIDER_SITE_OTHER): Payer: Medicare Other | Admitting: Neurology

## 2015-08-16 ENCOUNTER — Encounter (INDEPENDENT_AMBULATORY_CARE_PROVIDER_SITE_OTHER): Payer: Self-pay

## 2015-08-16 VITALS — BP 116/78 | HR 82 | Resp 20 | Ht 66.0 in | Wt 245.0 lb

## 2015-08-16 DIAGNOSIS — G4730 Sleep apnea, unspecified: Secondary | ICD-10-CM | POA: Diagnosis not present

## 2015-08-16 DIAGNOSIS — D510 Vitamin B12 deficiency anemia due to intrinsic factor deficiency: Secondary | ICD-10-CM | POA: Insufficient documentation

## 2015-08-16 DIAGNOSIS — M810 Age-related osteoporosis without current pathological fracture: Secondary | ICD-10-CM | POA: Insufficient documentation

## 2015-08-16 DIAGNOSIS — M129 Arthropathy, unspecified: Secondary | ICD-10-CM | POA: Diagnosis not present

## 2015-08-16 DIAGNOSIS — G4733 Obstructive sleep apnea (adult) (pediatric): Secondary | ICD-10-CM

## 2015-08-16 DIAGNOSIS — G2581 Restless legs syndrome: Secondary | ICD-10-CM | POA: Diagnosis not present

## 2015-08-16 DIAGNOSIS — D509 Iron deficiency anemia, unspecified: Secondary | ICD-10-CM | POA: Insufficient documentation

## 2015-08-16 DIAGNOSIS — G4700 Insomnia, unspecified: Secondary | ICD-10-CM | POA: Diagnosis not present

## 2015-08-16 DIAGNOSIS — D519 Vitamin B12 deficiency anemia, unspecified: Secondary | ICD-10-CM | POA: Insufficient documentation

## 2015-08-16 DIAGNOSIS — M170 Bilateral primary osteoarthritis of knee: Secondary | ICD-10-CM

## 2015-08-16 DIAGNOSIS — Z9989 Dependence on other enabling machines and devices: Secondary | ICD-10-CM

## 2015-08-16 HISTORY — DX: Restless legs syndrome: G25.81

## 2015-08-16 MED ORDER — VITAMIN D (ERGOCALCIFEROL) 1.25 MG (50000 UNIT) PO CAPS: 50000.0000 [IU] | ORAL_CAPSULE | ORAL | Status: DC

## 2015-08-16 MED ORDER — SUVOREXANT 15 MG PO TABS: 15.0000 mg | ORAL_TABLET | Freq: Every evening | ORAL | Status: DC

## 2015-08-16 MED ORDER — CYANOCOBALAMIN 500 MCG/0.1ML NA SOLN: 500.0000 ug | NASAL | Status: DC

## 2015-08-16 MED ORDER — GABAPENTIN 300 MG PO CAPS: 300.0000 mg | ORAL_CAPSULE | Freq: Three times a day (TID) | ORAL | Status: DC

## 2015-08-16 NOTE — Progress Notes (Signed)
[facility]   Provider:  Larey Seat, M D  Referring Provider: No ref. provider found Primary Care Physician:    Chief Complaint  Patient presents with  . Follow-up    new cpap user, and RLS, rm 10, alone    HPI:  [name] is a 69 y.o. female  Is seen here as a referral  from Dr. No ref. provider found for snoring and restless legs.  I have followed [name] from 2003 through 2008 in the past. The patient had undergone doing this. A gastric bypass surgery which led to an extensive loss of body weight. In the process she was no longer suffering from obstructive sleep apnea related to the weight loss, but she developed restless legs. Repeated iron studies had shown that she had extremely low ferritin levels. A ferritin level lower than 50 is associated with a much higher incidence of restless leg syndrome in addition her iron and iron binding capacities have been surprisingly normal and this conflict of data we have discussed today. Usually I would treat the patient with Ferrlecit and infusion once every 3 months or so to keep up ferritin levels in in her case I am not sure how much free iron is actually in her system. 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 In addition she is vitamin D deficient which are almost 80% of adult Americans. Gained some weight back but she has no longer nausea or dumping syndrome so therefore she does not need Phenergan or Zofran.  She is using gabapentin and ropinirole to treat her restless legs she often wakes up with a headache and she suffers from at times severe crippling migraines. Leg cramps also interfere with her sleep the initiation of sleep as well as the sleep duration at night.  As her  BMI exceeds 35,  she may also have developed apnea again and snoring has been witnessed by her family. We are meeting today to reevaluate all these conditions in context. She is also excessively daytime sleepy for  fatigue severity score is endorsed at 63 points her Epworth sleepiness score was endorsed at 15 points. Bedtime is usually 10 Pm and her husband leaves for work at 6.30 AM , after that she will sleep for 2-3 hours.  Total daily sleep time is 14 hours, this is more likely related to depression.   Interval history from 08-16-15 [name] reports that the iron infusion that she received under Dr. Eber Jones care helped her greatly for while by July of this year she was supposed to follow-up with him but he has meanwhile started her teaching job at the hospital and was not able to follow up. So she is here again because her restless legs are acting up again. She has always responded to iron infusions and as she is status post gastric bypass surgery she does not have a normal absorption for by mouth iron. Her ferritin levels have been clinically and critically low. She underwent a repeat sleep study on 1-20 7-16 after endorsing the Epworth Sleepiness Scale at 19 points. Her AHI was 30.3 her RDI was 38.6 supine AHI was 44.5. Was titrated to 10 cm water pressure the titration was not optimal. She was therefore prescribed an hour to titrate her. Now her residual AHI is 2.8 and she remains on an O2 sat at 7 and 15 cm water with full-time EPR of 3 cm. I'm able to obtain a download here in  office and reviewed with the patient she has been 100% compliance for days of use, 87% compliance for over 4 hours of consecutive use and average user time is 6 hours 49 minutes. 95th percentile pressure is 11.5 cm water.  Her restless leg syndrome quality of life questionnaire was endorsed at moderate to severe impairment and the RLS 6 rating scale was endorsed at severe impairment. We are going to infuse her once again with Xyrem. She is on Requip 2 mg total. I will see if I can enroll her into our restless leg study.    Review of Systems: Out of a complete 14 system review, the patient complains of only the following  symptoms, and all other reviewed systems are negative. Restless legs she often wakes up with a headache and she suffers from at times severe crippling migraines.  Leg cramps also interfere with her sleep the initiation of sleep as well as the sleep duration at night.  As her  BMI exceeds 35,  she may also have developed apnea again and snoring has been witnessed by her family. We are meeting today to reevaluate all these conditions in context. She is also excessively daytime sleepy for fatigue severity score is endorsed at 63 points her Epworth sleepiness score was endorsed at 15 points  Her daughter K. Strickland and her mother have been extremely sleepy. Family history addendum  Epworth score 3 from 15 , Fatigue severity score  34 from 63  , depression score 3. She started exercising after knee surgery .  She cared for her father , who was living in the house next door , and died at age 53.   Social History   Social History  . Marital Status: Married    Spouse Name: [name]  . Number of Children: 3  . Years of Education: college   Occupational History  . Retired    Social History Main Topics  . Smoking status: Never Smoker   . Smokeless tobacco: Not on file  . Alcohol Use: No  . Drug Use: No  . Sexual Activity: Yes    Birth Control/ Protection: Post-menopausal   Other Topics Concern  . Not on file   Social History Narrative   Caffeine 8-10 cups daily.  (coffee 1 cup am, unsw tea all day long).     Family History  Problem Relation Age of Onset  . CAD Mother   . Hypertension Mother   . Neuropathy Mother   . Osteoarthritis Mother   . Heart disease Mother   . Breast cancer Maternal Grandmother   . CAD Paternal Grandfather   . Colon cancer Father     Past Medical History  Diagnosis Date  . Sleep apnea     uses CPAP  . H/O hiatal hernia     surgery for hernia  . Arthritis     generalized  . Leg cramps     takes Flexeril daily as needed  . Restless leg  syndrome     takes Requip daily  . Vitamin D deficiency   . Osteoporosis   . Tubular adenoma of colon   . Gestational diabetes   . Diverticulosis     benign  . Dyslipidemia   . Atrophic vaginitis   . DJD (degenerative joint disease)   . Vertigo   . 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 Depression     takes Cymbalta daily  . Insomnia     takes Ambien nightly  . GERD (gastroesophageal reflux  disease)     takes Nexium daily  . Dysrhythmia   . Asthma   . Pneumonia 48yrs ago    hx of  . History of bronchitis 1 yr ago  . Headache(784.0)     takes Imitrex daily as needed and Bisoprolol daily;last migraine was about 2wks ago  . Peripheral neuropathy     takes Gabapentin daily  . Joint pain   . Joint swelling   . Back pain     DDD/stenosis  . Family history of GI bleeding   . Nocturia   . 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 History of shingles     Past Surgical History  Procedure Laterality Date  . Gastric bypass  2001  . Skin reduction  2003 2004    stomach and arm  . Wisdom tooth extraction    . Excess skin removal      post weight loss  . Hernia repair      umbilical  . Colonoscopy    . Esophagogastroduodenoscopy    . Joint replacement Right     knee  . Steroid injection to scar      x 2 in back   . 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 amphetamine-dextroamphetamine (ADDERALL) 10 MG tablet Take 10 mg by mouth daily with breakfast.   0  . aspirin EC 325 MG EC tablet Take 1 tablet (325 mg total) by mouth 2 (two) times daily after a meal. 30 tablet 0  . bisoprolol (ZEBETA) 10 MG tablet Take 10 mg by mouth 2 (two) times daily.    . cyclobenzaprine (FLEXERIL) 10 MG tablet Take 10 mg by mouth daily as needed for muscle spasms (Usually take  before bedtime).   0  . DULERA 200-5 MCG/ACT AERO Inhale 2 puffs into the lungs 2 (two) times daily as needed for wheezing or shortness of breath.     . DULoxetine (CYMBALTA) 30 MG capsule Take 30 mg by mouth 2 (two) times daily.     Marland Kitchen esomeprazole (NEXIUM) 40 MG capsule Take 40 mg by mouth 2 (two) times daily before a meal.   6  . Fexofenadine HCl (ALLEGRA ALLERGY PO) Take by mouth.    . fluticasone (FLONASE) 50 MCG/ACT nasal spray Place 2 sprays into both nostrils daily as needed for allergies or rhinitis.    Marland Kitchen gabapentin (NEURONTIN) 300 MG capsule Take 300 mg by mouth 2 (two) times daily.     Marland Kitchen HYDROcodone-acetaminophen (NORCO) 10-325 MG per tablet Take 1-2 tablets by mouth every 4 (four) hours as needed for moderate pain. 120 tablet 0  . methocarbamol (ROBAXIN) 500 MG tablet Take 1 tablet (500 mg total) by mouth every 6 (six) hours as needed for muscle spasms. 50 tablet 0  . quiNINE (QUALAQUIN) 324 MG capsule Take one after lunch and one after dinner po. (Patient taking differently: Take 324 mg by mouth 2 (two) times daily. Take one after lunch and one after dinner po.) 60 capsule 5  . rOPINIRole (REQUIP) 1 MG tablet Take 1 tablet (1 mg total) by mouth at bedtime as needed and may repeat dose one time if needed. 60 tablet 0  . SUMAtriptan (IMITREX) 100 MG tablet Take 1 tablet (100 mg total) by mouth once  as needed for migraine or headache. May repeat in 2 hours if headache persists or recurs. 10 tablet 2  . zolpidem (AMBIEN) 10 MG tablet Take 10 mg by mouth at bedtime.   4   No current facility-administered medications for this visit.    Allergies as of 08/16/2015  . (No Known Allergies)    Vitals: BP 116/78 mmHg  Pulse 82  Resp 20  Ht 5\' 6"  (1.676 m)  Wt 245 lb (111.131 kg)  BMI 39.56 kg/m2 Last Weight:  Wt Readings from Last 1 Encounters:  08/16/15 245 lb (111.131 kg)       Last Height:   Ht Readings from Last 1 Encounters:  08/16/15 5\' 6"  (1.676 m)    Physical  exam:  General: The patient is awake, alert and appears not in acute distress. The patient is well groomed. Head: Normocephalic, atraumatic. Neck is supple. Mallampati 3 ,  neck circumference: 15 inches. Nasal airflow unrestricted, TMJ is not evident. Retrognathia is seen.  Cardiovascular:  Regular rate and rhythm, without  murmurs or carotid bruit, and without distended neck veins. Respiratory: Lungs are clear to auscultation. Skin:  Without evidence of edema, or rash, well healed knee replacement scars.  Trunk: BMI is elevated .  Neurologic exam :The patient is awake and alert, oriented to place and time.  Memory subjective described as intact.  There is a normal attention span & concentration ability. Speech is fluent without  dysarthria, dysphonia or aphasia. Mood and affect are appropriate. Cranial nerves: Pupils are equal and briskly reactive to light.  Extraocular movements  in vertical and horizontal planes intact and without nystagmus. Visual fields by finger perimetry are intact. Hearing to finger rub intact.  Facial sensation intact to fine touch. Facial motor strength is symmetric and tongue and uvula move midline. Shoulder shrug intact . Motor exam:   Normal tone, muscle bulk and symmetric strength in all extremities. No cog wheeling and no waxy or clasp knife response.  Sensory:  Fine touch, pinprick and vibration were tested in all extremities. Proprioception in both feet is affeced by pin and needle dysesthesias.  Coordination: Rapid alternating movements in the fingers/hands is normal. Finger-to-nose maneuver normal without evidence of ataxia, dysmetria or tremor. Gait and station: Patient walks without assistive device and is able unassisted to climb up to the exam table. Strength within normal limits. Stance is stable and normal.  Deep tendon reflexes: in the  upper and lower extremities are symmetric and intact. Babinski maneuver downgoing.   Assessment:  After physical and  neurologic examination, review of laboratory studies, imaging, neurophysiology testing and pre-existing records, assessment is   The patient was advised of the nature of the diagnosed sleep disorder , the treatment options and risks for general a health and wellness arising from not treating the condition. Visit duration was 40 minutes.  Of today's new patient visit-consultation 50% of our face-to-face time are spent in discussion of the medical conditions found the differential diagnosis the testing and treatment options for the conditions. The patient is willing to undergo a new iron infusion cycle .  Her cardiologists note's support this Pernell Dupre ,MD ) , Dr. Waymon Budge.  Since Mrs. score has undergone a gastric bypass surgery in the past she has some mild absorption issues. She is iron deficient, she has been vitamin D deficient she has been vitamin B12 deficient. I have prescribed her a vitamin B12 nasal spray, vitamin D 50,000 units weekly pill, and I will obtain iron  studies today to see which dose of an iron infusion would be indicated for her. To treat her remaining insomnia problem in spite of well treated obstructive sleep apnea I have given her samples of BellSombra she will have a coupon so-called loyalty card that she can use at the pharmacy for 10  tabs of 15 mg. I hope this will replace her ambien.   Plan:  Treatment plan and additional workup :  1 RLS with iron deficiency ) Dr. Beryle Beams  evaluated her discrepancy between very low ferritin levels  and apparently sufficient iron storage. She has maintained oral iron intake as well as multivitamins and multi minerals. I will order IV iron for her.  2)  Mrs. Gaber's sensory neuropathy. She is not diabetic she has no documented thyroid disease and neither did her mother nor has her daughter but all of these women half severe painful dysesthesias leg cramps and restless legs. She took quinine with good success, but had to order from  [place].  3) OSA- continue on CPAP, auto PAP .  4) Her headaches continued , inspite of high CPAP compliance. Not  sleep related. She wakes up with migraines . She is unable to sleep off the headache. She does have a visual aura, nausea. She takes hydrocodone, prescribed by Dr Paulita Fujita? 5) arthritis pain, improved after knee surgery .      Asencion Partridge Tylisha Danis MD  08/16/2015

## 2015-08-16 NOTE — Telephone Encounter (Signed)
Per previous note, refill okay per provider

## 2015-08-16 NOTE — Patient Instructions (Signed)
Ferritin This is done to test for anemia. Anemia occurs when the amount of hemoglobin (found in the red blood cells) drops below normal. Hemoglobin is necessary for the transportation of oxygen throughout the body. Blood tests may show a variety of common, treatable abnormalities that can lead to problems associated with anemia. Iron deficiency anemia is the most common of the anemias. It is usually due to bleeding. In women, iron deficiency may be due to heavy menstrual periods. In older women and in men, the bleeding is usually from disease of the intestines. In children and in pregnant women, the body needs more iron. Iron deficiency may be due to simply not eating enough iron in the diet. Iron deficiency may also result from some extreme diets. Treatment of iron deficiency usually involves iron supplements. In older women and in men, there is usually some further testing needed to determine why the person is iron deficient.  PREPARATION FOR TEST A blood sample is obtained by inserting a needle into a vein in the arm. NORMAL FINDINGS Female: 12-300ng/ml or 12-300  g/L (SI units) Female:  10-150 ng/ml or 10-150  g/L (SI units) Children/adolescent:  Newborn: 25-200 ng/ml  1 month: 200-600 ng/ml  2-5 months: 50-200 ng/ml  6 months-15 years: 7-142 ng/ml Ranges for normal findings may vary among different laboratories and hospitals. You should always check with your doctor after having lab work or other tests done to discuss the meaning of your test results and whether your values are considered within normal limits. MEANING OF TEST  Your caregiver will go over the test results with you and discuss the importance and meaning of your results, as well as treatment options and the need for additional tests if necessary. OBTAINING THE TEST RESULTS  It is your responsibility to obtain your test results. Ask the lab or department performing the test when and how you will get your results. Document  Released: 12/24/2004 Document Revised: 02/23/2012 Document Reviewed: 11/10/2008 [facility] Patient Information 2015 [place], [place]. This information is not intended to replace advice given to you by your health care provider. Make sure you discuss any questions you have with your health care provider.

## 2015-08-17 DIAGNOSIS — M7071 Other bursitis of hip, right hip: Secondary | ICD-10-CM | POA: Diagnosis not present

## 2015-08-21 ENCOUNTER — Telehealth: Payer: Self-pay

## 2015-08-21 LAB — FERRITIN: FERRITIN: 67 ng/mL (ref 15–150)

## 2015-08-21 LAB — IRON AND TIBC
Iron Saturation: 35 % (ref 15–55)
Iron: 116 ug/dL (ref 27–139)
TIBC: 328 ug/dL (ref 250–450)
UIBC: 212 ug/dL (ref 118–369)

## 2015-08-21 LAB — METHYLMALONIC ACID, SERUM: METHYLMALONIC ACID: 241 nmol/L (ref 0–378)

## 2015-08-21 NOTE — Telephone Encounter (Signed)
Called patient to give lab results per Dr. Brett Fairy: IRON is again very low. Ferritin is normal ( over 50 ), b12 is pending.  Patient verbalized understanding. Requested plan for iron. Will set up infusion per Dr. Keturah Barre. Patient stopped by office and picked up Belsomra samples.

## 2015-08-21 NOTE — Telephone Encounter (Deleted)
-----   Message from Larey Seat, MD sent at 08/21/2015  4:09 PM EDT -----  Please call patient with results, i don't know why this came back to me. CD ----- Message -----    From: SYSTEM    Sent: 08/21/2015  12:05 AM      To: Larey Seat, MD, [facility] Provider Pool

## 2015-08-22 ENCOUNTER — Ambulatory Visit: Payer: Self-pay | Admitting: Neurology

## 2015-08-23 NOTE — Telephone Encounter (Signed)
Patient states she is not feeling well at all. She is requesting to know the status of infusion. Advised patient that she would be able to have infusion done here @ [facility]. Waiting on Dr. Keturah Barre to give orders to infusion nurse, from that point infusion nurse will process and try to get patient in by the end of next week.

## 2015-08-23 NOTE — Telephone Encounter (Signed)
Orders filled out for Ferrlicit infusion.  I'm still not sure if the patient can receive the medication here in our infusion suite or not. Nurse [place] working on [employer].

## 2015-08-24 DIAGNOSIS — Z86010 Personal history of colonic polyps: Secondary | ICD-10-CM | POA: Diagnosis not present

## 2015-08-24 DIAGNOSIS — Z23 Encounter for immunization: Secondary | ICD-10-CM | POA: Diagnosis not present

## 2015-08-24 DIAGNOSIS — K921 Melena: Secondary | ICD-10-CM | POA: Diagnosis not present

## 2015-08-24 DIAGNOSIS — D509 Iron deficiency anemia, unspecified: Secondary | ICD-10-CM | POA: Diagnosis not present

## 2015-08-24 DIAGNOSIS — M2550 Pain in unspecified joint: Secondary | ICD-10-CM | POA: Diagnosis not present

## 2015-08-24 DIAGNOSIS — R1310 Dysphagia, unspecified: Secondary | ICD-10-CM | POA: Diagnosis not present

## 2015-08-27 NOTE — Telephone Encounter (Signed)
Order for Ferrilicit 125mg  in 185ml NaCl.  This order given to Otila Kluver in [place] on Thursday 08-23-15.

## 2015-08-28 ENCOUNTER — Telehealth: Payer: Self-pay | Admitting: *Deleted

## 2015-08-28 NOTE — Telephone Encounter (Signed)
I called and let her know that I have the coupon card and will place up front for her.

## 2015-08-28 NOTE — Telephone Encounter (Signed)
Pt called.  Needing the coupon card that went with belsomra samples.  She can get free 10 tablets from pharmacy, she just mislaid this card.  I see only coupon card.  She would also like to have a medical necessity letter for the belsomra which works better then [place].  Also insurance will not approve B12 nasal spray (she cannot absorb oral due to gastric bypass).  B12 injections option?  Would this also be included in necessity letter (about B12 nasal spray).  I would call her back about coupon (mobile #).

## 2015-08-28 NOTE — Telephone Encounter (Signed)
Thank you :)

## 2015-08-29 ENCOUNTER — Other Ambulatory Visit: Payer: Self-pay

## 2015-08-29 NOTE — Telephone Encounter (Signed)
Advised pt that Dr. Brett Fairy was willing to write the letter of medical necessity for her pt's belsomra. Pt wishes the letter to be mailed to her. I also advised pt that per Dr. Brett Fairy, pt can try B12 sublingual, which is over the counter. Pt inquired about the dose Dr. Brett Fairy recommended. I advised pt that I would ask Dr. Brett Fairy and call her back.

## 2015-08-30 NOTE — Telephone Encounter (Signed)
Called and spoke to the patient and advised her that Dr. Brett Fairy is out of the office again today but another physician, Dr. Rexene Alberts, recommended sublingual b12 1000-2000 mcg by mouth daily until pt's follow up with Dr. Brett Fairy in December. Pt verbalized understanding. I will mail the medical necessity letter to the pt today.

## 2015-08-30 NOTE — Telephone Encounter (Signed)
She can take 1000 to 2000 mcg PO daily until FU with Dr. Keturah Barre in December.

## 2015-09-03 ENCOUNTER — Telehealth: Payer: Self-pay | Admitting: Nurse Practitioner

## 2015-09-03 ENCOUNTER — Telehealth: Payer: Self-pay

## 2015-09-03 MED ORDER — SUVOREXANT 15 MG PO TABS: 15.0000 mg | ORAL_TABLET | Freq: Every evening | ORAL | Status: DC

## 2015-09-03 MED ORDER — SUVOREXANT 15 MG PO TABS: 15.0000 mg | ORAL_TABLET | Freq: Every evening | ORAL | Status: DC | PRN

## 2015-09-03 NOTE — Telephone Encounter (Signed)
Patient presents as walk-in requesting appointment with Dr. Tamala Julian for right side neck pain x 2 weeks without known injury. She is alert & oriented to person, place, time and in no acute distress.  She states she sometimes gets dizzy when she leans forward.  She points to the area of her neck under her right ear and asks if this is where her carotid artery is located.  I showed her the correct location of the carotid and discussed possible causes of neck pain.  Her BP is 133/78 mmHg, heart rate 67 bpm, weight 249 lb 8 oz.  She is concerned about a blockage in her carotid artery.  I advised her that Dr. Tamala Julian reported on 10/19/14 that carotid bruit was absent and that her symptoms do not sound cardiac in nature.  She states she came here because Dr. Tamala Julian is her cardiologist and requests an appointment with him for evaluation of her arteries.  I advised that first available with him is January 2017 and offered a sooner appointment with an APP.  She is scheduled to see Richardson Dopp, PA on 10/3.  I advised her to follow-up with PCP or Urgent Care for symptoms of neck pain.

## 2015-09-03 NOTE — Telephone Encounter (Signed)
Pt stopped by office today asking for a vitamin b12 shot instead of taking the vitamin b12 sublingually. She says that she remembers now that the b12 SL does not work and will not work. She is requesting a vitamin b12 shot to be given tomorrow (09/04/15). She is coming in for her iron infusion tomorrow.  She is also requesting an RX for belsomra for 30 tablets. She is also asking for belsomra samples. 2 packets of 3 tablets of belsomra 15 mg given to pt. [id number] Exp:09/2016  I advised her that Dr. Brett Fairy is out of the office and when she returns we will call her back. Pt verbalized understanding.

## 2015-09-04 NOTE — Telephone Encounter (Signed)
Vitamin B 12 shots given through PCP or home health, usually not through the office.  Who will inject her if I prescribe.

## 2015-09-04 NOTE — Telephone Encounter (Signed)
I called to advise pt that I faxed the belsomra RX to her [facility]. I also advised pt that Dr. Brett Fairy said that she could get the vitamin b12 shot through her PCP or home health. Pt wishes to see her PCP to get the vitamin 12 shot. Pt verbalized understanding to call us with further questions or concerns.

## 2015-09-06 DIAGNOSIS — D519 Vitamin B12 deficiency anemia, unspecified: Secondary | ICD-10-CM | POA: Diagnosis not present

## 2015-09-15 ENCOUNTER — Other Ambulatory Visit: Payer: Self-pay | Admitting: Neurology

## 2015-09-15 DIAGNOSIS — G603 Idiopathic progressive neuropathy: Secondary | ICD-10-CM | POA: Diagnosis not present

## 2015-09-15 DIAGNOSIS — G890 Central pain syndrome: Secondary | ICD-10-CM | POA: Diagnosis not present

## 2015-09-15 DIAGNOSIS — R202 Paresthesia of skin: Secondary | ICD-10-CM | POA: Diagnosis not present

## 2015-09-15 DIAGNOSIS — M545 Low back pain: Secondary | ICD-10-CM | POA: Diagnosis not present

## 2015-09-15 DIAGNOSIS — M542 Cervicalgia: Secondary | ICD-10-CM | POA: Diagnosis not present

## 2015-09-15 DIAGNOSIS — M25562 Pain in left knee: Secondary | ICD-10-CM | POA: Diagnosis not present

## 2015-09-15 DIAGNOSIS — M25561 Pain in right knee: Secondary | ICD-10-CM | POA: Diagnosis not present

## 2015-09-16 NOTE — Progress Notes (Signed)
Cardiology Office Note   Date:  09/17/2015   ID:  [name], DOB [birth year], MRN [id number]  PCP:  [name], MD  Cardiologist:  Dr. Daneen Schick   Electrophysiologist:  n/a  Chief Complaint  Patient presents with  . Neck Pain  . Other    Anomalous origin of RCA     History of Present Illness: [name] is a 69 y.o. 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  Last seen by Dr. Daneen Schick 11/15.  She complained of chest pain.  Myoview in 11/15 was neg for ischemia.    She returns today for evaluation of neck pain.  She is worried about carotid stenosis. She denies symptoms c/w TIA or AFugax.  She does have an increase in Migraines recently with + scotoma.  She will FU with Neuro soon for this problem.  She notes pain with turning her head to the right and with palpating her parotid gland.  She may have had a low grade temp.  Neck pain has been going on for 5 weeks.  No chest pain, dyspnea, syncope.  She notes a burn on her L forearm that happened today.  She burned it on her stove.    Studies/Reports Reviewed Today:  Myoview 11/15 Normal stress nuclear study.  LV Ejection Fraction: 65%.  LHC 04/2009 EF: 65%. LM: widely patent. LAD:  normal. [id number]. 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 There is a slit-like origin of the right coronary artery beginning at its ostium from the left sinus of Valsalva. This is a potentially ischemia producing anatomic  variant.   Past Medical History  Diagnosis Date  . Sleep apnea     uses CPAP  . H/O hiatal hernia     surgery for hernia  . Arthritis     generalized  . Leg cramps     takes Flexeril daily as needed  . Restless leg syndrome     takes Requip daily  . Vitamin D deficiency   . Osteoporosis   . Tubular adenoma of colon   . Gestational diabetes   . Diverticulosis     benign  . Dyslipidemia   . Atrophic vaginitis   . DJD (degenerative joint disease)   . Vertigo   . 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 Depression     takes Cymbalta daily  . Insomnia     takes Ambien nightly  . GERD (gastroesophageal reflux disease)     takes Nexium daily  . Dysrhythmia   . Asthma   . Pneumonia 78yrs ago    hx of  . History of bronchitis 1 yr ago  . Headache(784.0)     takes Imitrex daily as needed and Bisoprolol daily;last migraine was about 2wks ago  . Peripheral neuropathy (HCC)     takes Gabapentin daily  . Joint pain   . Joint swelling   . Back pain     DDD/stenosis  . Family history of GI bleeding   .  Nocturia   . 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 History of shingles   . RLS (restless legs syndrome) 08/16/2015    Past Surgical History  Procedure Laterality Date  . Gastric bypass  2001  . Skin reduction  2003 2004    stomach and arm  . Wisdom tooth extraction    . Excess skin removal      post weight loss  . Hernia repair      umbilical  . Colonoscopy    . Esophagogastroduodenoscopy    . Joint replacement Right     knee  . Steroid injection to scar      x 2 in back   . Total knee arthroplasty Left 04/03/2015    Procedure: LEFT TOTAL KNEE ARTHROPLASTY;  Surgeon: Melrose Nakayama, MD;  Location: [facility];  Service: Orthopedics;  Laterality: Left;     Current Outpatient Prescriptions  Medication Sig Dispense Refill  . amphetamine-dextroamphetamine (ADDERALL) 10 MG tablet Take 10 mg by mouth daily with breakfast.   0  . bisoprolol (ZEBETA) 10 MG  tablet Take 10 mg by mouth 2 (two) times daily.    . cyclobenzaprine (FLEXERIL) 10 MG tablet Take 10 mg by mouth daily as needed for muscle spasms (Usually take before bedtime).   0  . DULoxetine (CYMBALTA) 30 MG capsule Take 30 mg by mouth 2 (two) times daily.     Marland Kitchen esomeprazole (NEXIUM) 40 MG capsule Take 40 mg by mouth 2 (two) times daily before a meal.   6  . Fexofenadine HCl (ALLEGRA ALLERGY PO) Take 1 tablet by mouth daily as needed (AS NEED FOR ALLERGIES).     Marland Kitchen gabapentin (NEURONTIN) 300 MG capsule Take 1 capsule (300 mg total) by mouth 3 (three) times daily. 90 capsule 5  . HYDROcodone-acetaminophen (NORCO) 10-325 MG tablet TAKE 1 TABLET DAILY AS NEEDED FOR PAIN  0  . methocarbamol (ROBAXIN) 500 MG tablet Take 1 tablet (500 mg total) by mouth every 6 (six) hours as needed for muscle spasms. 50 tablet 0  . oxyCODONE (OXY IR/ROXICODONE) 5 MG immediate release tablet Take 5 mg by mouth daily as needed for severe pain.     . quiNINE (QUALAQUIN) 324 MG capsule TAKE 1 CAPSULE AFTER LUNCH AND AFTER DINNER. 60 capsule 6  . rOPINIRole (REQUIP) 1 MG tablet TAKE 1 TABLET(1 MG) BY MOUTH AT BEDTIME AS NEEDED. MAY REPEAT DOSE 1 TIME AS NEEDED 180 tablet 0  . Vitamin D, Ergocalciferol, (DRISDOL) 50000 UNITS CAPS capsule Take 1 capsule (50,000 Units total) by mouth every 7 (seven) days. 12 capsule 1  . zolpidem (AMBIEN) 10 MG tablet Take 10 mg by mouth at bedtime.   4  . silver sulfADIAZINE (SILVADENE) 1 % cream Apply 1 application topically 2 (two) times daily. 50 g 0   No current facility-administered medications for this visit.    Allergies:   Review of patient's allergies indicates no known allergies.    Social History:  The patient  reports that she has never smoked. She does not have any smokeless tobacco history on file. She reports that she does not drink alcohol or use illicit drugs.   Family History:  The patient's family history includes Breast cancer in her maternal grandmother; CAD in  her mother and paternal grandfather; Colon cancer in her father; Heart disease in her mother; Hypertension in her mother; Neuropathy in her mother; Osteoarthritis in her mother.    ROS:   Please  see the history of present illness.   Review of Systems  HENT: Positive for headaches.   Eyes: Positive for visual disturbance.  All other systems reviewed and are negative.     PHYSICAL EXAM: VS:  BP 122/80 mmHg  Pulse 69  Ht 5\' 6"  (1.676 m)  Wt 250 lb (113.399 kg)  BMI 40.37 kg/m2    Wt Readings from Last 3 Encounters:  09/17/15 250 lb (113.399 kg)  08/16/15 245 lb (111.131 kg)  04/03/15 253 lb (114.76 kg)     GEN: Well nourished, well developed, in no acute distress HEENT: normal Neck: no JVD, ? R carotid bruit, R parotid gland ? Soft, but Enlarged with some tenderness - no erythema noted Cardiac:  Normal S1/S2, RRR; short 1/6 systolic murmur RUSB,  no rubs or gallops, no edema   Respiratory:  clear to auscultation bilaterally, no wheezing, rhonchi or rales. GI: soft, nontender, nondistended, + BS MS: no deformity or atrophy Skin: L forearm with partial thickness burn (5 x 3 cm) with excellent granulation tissue present, no dishcarge Neuro:  CNs II-XII intact, Strength and sensation are intact Psych: Normal affect   EKG:  EKG is ordered today.  It demonstrates:   NSR, HR 69, normal axis, no ST changes.    Recent Labs: 03/22/2015: BUN 12; Creatinine, Ser 1.04; Hemoglobin 11.4*; Platelets 297; Potassium 4.3; Sodium 139    Lipid Panel No results found for: CHOL, TRIG, HDL, CHOLHDL, VLDL, LDLCALC, LDLDIRECT    ASSESSMENT AND PLAN:  1. Bruit:  She has a systolic murmur that she says she has had for a long time.  It sounds like a flow murmur.  She is worried about carotid stenosis.  I suspect I am hearing her murmur but cannot rule out carotid bruit on the R. Obtain Carotid US.    2. Neck Pain:  She has tenderness over the parotid gland.  It is slightly prominent but soft and  there is no erythema.  Refer to ENT.  3. 2nd Degree Burn:  She burned her L forearm with her oven today.  Will given her Silvadene 1% crm bid until clear.  We discussed the signs of infection.  She should FU with PCP if she feels it is getting infected.       Medication Changes: Current medicines are reviewed at length with the patient today.  Concerns regarding medicines are as outlined above.  The following changes have been made:   Discontinued Medications   ASPIRIN EC 325 MG EC TABLET    Take 1 tablet (325 mg total) by mouth 2 (two) times daily after a meal.   CYANOCOBALAMIN 500 MCG/0.1ML SOLN    Place 0.1 mLs (500 mcg total) into the nose 2 (two) times a week.   DULERA 200-5 MCG/ACT AERO    Inhale 2 puffs into the lungs 2 (two) times daily as needed for wheezing or shortness of breath.    FLUTICASONE (FLONASE) 50 MCG/ACT NASAL SPRAY    Place 2 sprays into both nostrils daily as needed for allergies or rhinitis.   SUVOREXANT (BELSOMRA) 15 MG TABS    Take 15 mg by mouth Nightly.   SUVOREXANT (BELSOMRA) 15 MG TABS    Take 15 mg by mouth at bedtime as needed.   TIZANIDINE (ZANAFLEX) 4 MG TABLET       Modified Medications   No medications on file   New Prescriptions   SILVER SULFADIAZINE (SILVADENE) 1 % CREAM    Apply 1 application topically  2 (two) times daily.   Labs/ tests ordered today include:   Orders Placed This Encounter  Procedures  . Ambulatory referral to ENT  . EKG 12-Lead      Disposition:    FU with Dr. Daneen Schick as planned.     Signed, Versie Starks, MHS 09/17/2015 4:58 PM    [facility] [address], [place], [place]  [address] Phone: [phone]; Fax: [phone]

## 2015-09-17 ENCOUNTER — Ambulatory Visit (INDEPENDENT_AMBULATORY_CARE_PROVIDER_SITE_OTHER): Payer: Medicare Other | Admitting: Physician Assistant

## 2015-09-17 ENCOUNTER — Encounter: Payer: Self-pay | Admitting: Physician Assistant

## 2015-09-17 VITALS — BP 122/80 | HR 69 | Ht 66.0 in | Wt 250.0 lb

## 2015-09-17 DIAGNOSIS — T300 Burn of unspecified body region, unspecified degree: Secondary | ICD-10-CM | POA: Diagnosis not present

## 2015-09-17 DIAGNOSIS — IMO0002 Reserved for concepts with insufficient information to code with codable children: Secondary | ICD-10-CM

## 2015-09-17 DIAGNOSIS — Q245 Malformation of coronary vessels: Secondary | ICD-10-CM

## 2015-09-17 DIAGNOSIS — K118 Other diseases of salivary glands: Secondary | ICD-10-CM | POA: Diagnosis not present

## 2015-09-17 DIAGNOSIS — R0989 Other specified symptoms and signs involving the circulatory and respiratory systems: Secondary | ICD-10-CM | POA: Diagnosis not present

## 2015-09-17 DIAGNOSIS — M7062 Trochanteric bursitis, left hip: Secondary | ICD-10-CM | POA: Diagnosis not present

## 2015-09-17 MED ORDER — SILVER SULFADIAZINE 1 % EX CREA: 1.0000 "application " | TOPICAL_CREAM | Freq: Two times a day (BID) | CUTANEOUS | Status: DC

## 2015-09-17 NOTE — Patient Instructions (Signed)
Medication Instructions:  1. AN RX FOR SILVADENE CREME HAS BEEN SENT IN; APPLY TWICE DAILY UNTIL CLEAR THEN STOP  Labwork: NONE  Testing/Procedures: Your physician has requested that you have a carotid duplex. This test is an ultrasound of the carotid arteries in your neck. It looks at blood flow through these arteries that supply the brain with blood. Allow one hour for this exam. There are no restrictions or special instructions.   Follow-Up: FOLLOW UP WITH DR. Tamala Julian AS PLANNED  Any Other Special Instructions Will Be Listed Below (If Applicable). YOU ARE BEING REFERRED TO ENT DX PAROTID GLAND SWELLING;

## 2015-09-21 ENCOUNTER — Telehealth: Payer: Self-pay | Admitting: *Deleted

## 2015-09-21 ENCOUNTER — Encounter: Payer: Self-pay | Admitting: Physician Assistant

## 2015-09-21 ENCOUNTER — Ambulatory Visit (HOSPITAL_COMMUNITY)
Admission: RE | Admit: 2015-09-21 | Discharge: 2015-09-21 | Disposition: A | Discharge status: Home / Self Care | Payer: Medicare Other | Source: Ambulatory Visit | Attending: Physician Assistant | Admitting: Physician Assistant

## 2015-09-21 DIAGNOSIS — R0989 Other specified symptoms and signs involving the circulatory and respiratory systems: Secondary | ICD-10-CM | POA: Diagnosis not present

## 2015-09-21 DIAGNOSIS — I6521 Occlusion and stenosis of right carotid artery: Secondary | ICD-10-CM | POA: Insufficient documentation

## 2015-09-21 DIAGNOSIS — E785 Hyperlipidemia, unspecified: Secondary | ICD-10-CM

## 2015-09-21 NOTE — Telephone Encounter (Signed)
Pt notified of carotid results by phone with verbal understanding. Pt agreeable to repeat carotid in 2 yrs.

## 2015-09-24 ENCOUNTER — Ambulatory Visit: Payer: Medicare Other | Admitting: Neurology

## 2015-09-24 DIAGNOSIS — G43019 Migraine without aura, intractable, without status migrainosus: Secondary | ICD-10-CM | POA: Diagnosis not present

## 2015-09-24 DIAGNOSIS — R42 Dizziness and giddiness: Secondary | ICD-10-CM | POA: Diagnosis not present

## 2015-09-24 DIAGNOSIS — R2689 Other abnormalities of gait and mobility: Secondary | ICD-10-CM | POA: Diagnosis not present

## 2015-09-28 DIAGNOSIS — M25562 Pain in left knee: Secondary | ICD-10-CM | POA: Diagnosis not present

## 2015-09-28 DIAGNOSIS — M545 Low back pain: Secondary | ICD-10-CM | POA: Diagnosis not present

## 2015-09-28 DIAGNOSIS — Z79891 Long term (current) use of opiate analgesic: Secondary | ICD-10-CM | POA: Diagnosis not present

## 2015-09-28 DIAGNOSIS — Z79899 Other long term (current) drug therapy: Secondary | ICD-10-CM | POA: Diagnosis not present

## 2015-09-28 DIAGNOSIS — M25552 Pain in left hip: Secondary | ICD-10-CM | POA: Diagnosis not present

## 2015-09-28 DIAGNOSIS — M542 Cervicalgia: Secondary | ICD-10-CM | POA: Diagnosis not present

## 2015-09-28 DIAGNOSIS — G890 Central pain syndrome: Secondary | ICD-10-CM | POA: Diagnosis not present

## 2015-10-09 DIAGNOSIS — F4320 Adjustment disorder, unspecified: Secondary | ICD-10-CM | POA: Diagnosis not present

## 2015-10-09 DIAGNOSIS — M542 Cervicalgia: Secondary | ICD-10-CM | POA: Diagnosis not present

## 2015-10-12 ENCOUNTER — Other Ambulatory Visit: Payer: Self-pay | Admitting: Otolaryngology

## 2015-10-12 ENCOUNTER — Other Ambulatory Visit: Payer: Self-pay | Admitting: Obstetrics & Gynecology

## 2015-10-12 DIAGNOSIS — D509 Iron deficiency anemia, unspecified: Secondary | ICD-10-CM | POA: Diagnosis not present

## 2015-10-12 DIAGNOSIS — N6320 Unspecified lump in the left breast, unspecified quadrant: Secondary | ICD-10-CM

## 2015-10-12 DIAGNOSIS — N63 Unspecified lump in breast: Secondary | ICD-10-CM | POA: Diagnosis not present

## 2015-10-12 DIAGNOSIS — M810 Age-related osteoporosis without current pathological fracture: Secondary | ICD-10-CM | POA: Diagnosis not present

## 2015-10-12 DIAGNOSIS — N941 Unspecified dyspareunia: Secondary | ICD-10-CM | POA: Diagnosis not present

## 2015-10-16 ENCOUNTER — Other Ambulatory Visit: Payer: Self-pay | Admitting: Otolaryngology

## 2015-10-16 ENCOUNTER — Encounter: Payer: Self-pay | Admitting: Physician Assistant

## 2015-10-16 DIAGNOSIS — R221 Localized swelling, mass and lump, neck: Secondary | ICD-10-CM

## 2015-10-16 DIAGNOSIS — M542 Cervicalgia: Secondary | ICD-10-CM

## 2015-10-17 ENCOUNTER — Ambulatory Visit
Admission: RE | Admit: 2015-10-17 | Discharge: 2015-10-17 | Disposition: A | Discharge status: Home / Self Care | Payer: Medicare Other | Source: Ambulatory Visit | Attending: Obstetrics & Gynecology | Admitting: Obstetrics & Gynecology

## 2015-10-17 DIAGNOSIS — N6320 Unspecified lump in the left breast, unspecified quadrant: Secondary | ICD-10-CM

## 2015-10-17 DIAGNOSIS — R928 Other abnormal and inconclusive findings on diagnostic imaging of breast: Secondary | ICD-10-CM | POA: Diagnosis not present

## 2015-10-17 DIAGNOSIS — M7062 Trochanteric bursitis, left hip: Secondary | ICD-10-CM | POA: Diagnosis not present

## 2015-10-17 DIAGNOSIS — N63 Unspecified lump in breast: Secondary | ICD-10-CM | POA: Diagnosis not present

## 2015-10-23 ENCOUNTER — Ambulatory Visit
Admission: RE | Admit: 2015-10-23 | Discharge: 2015-10-23 | Disposition: A | Discharge status: Home / Self Care | Payer: Medicare Other | Source: Ambulatory Visit | Attending: Otolaryngology | Admitting: Otolaryngology

## 2015-10-23 DIAGNOSIS — R221 Localized swelling, mass and lump, neck: Secondary | ICD-10-CM

## 2015-10-23 DIAGNOSIS — M542 Cervicalgia: Secondary | ICD-10-CM | POA: Diagnosis not present

## 2015-10-23 IMAGING — CT CT NECK W/ CM
4 of 7 series · 13 of 33 positions shown, 15 images · IV contrast (75CC ISOVUE 300)
Comparison: None.

CLINICAL DATA: Right neck fullness 2 months.  Neck pain

EXAM:
CT NECK WITH CONTRAST
TECHNIQUE: Multidetector CT imaging of the neck was performed using the
standard protocol following the bolus administration of intravenous
contrast.
CONTRAST:  75mL [MB] IOPAMIDOL ([MB]) INJECTION 61%

[Series 3: axial neck · axial · 0.46mm/px · z∈[+184,+261]mm · 2 of 95 slices shown]
[im 32/95  bone]
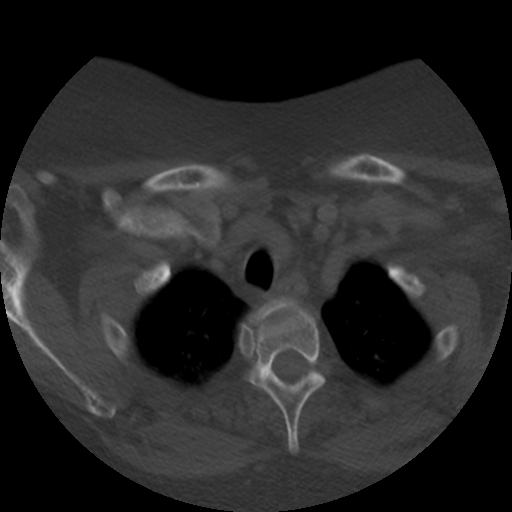
[im 63/95  bone]
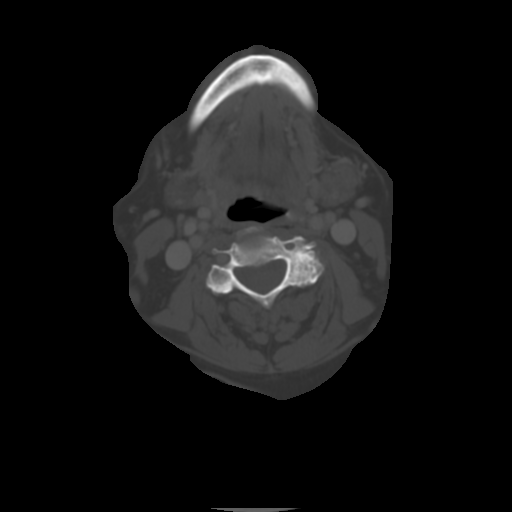

[Series 200: cor · coronal · 0.47mm/px · 3 of 112 slices shown]
[im 23/112  bone]
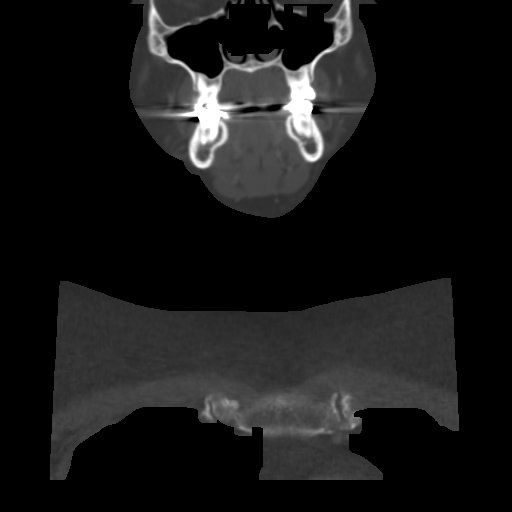
[im 45/112  bone]
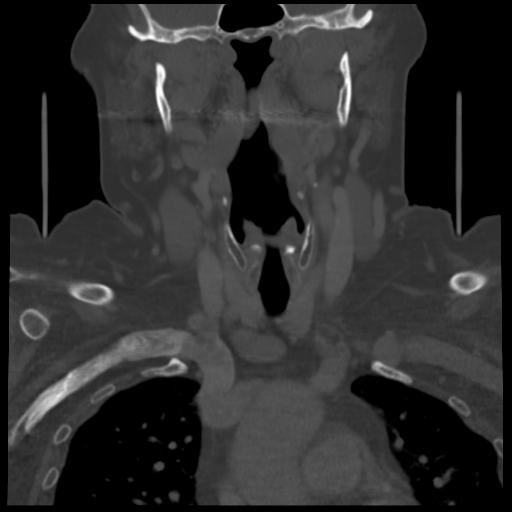
[im 67/112  bone]
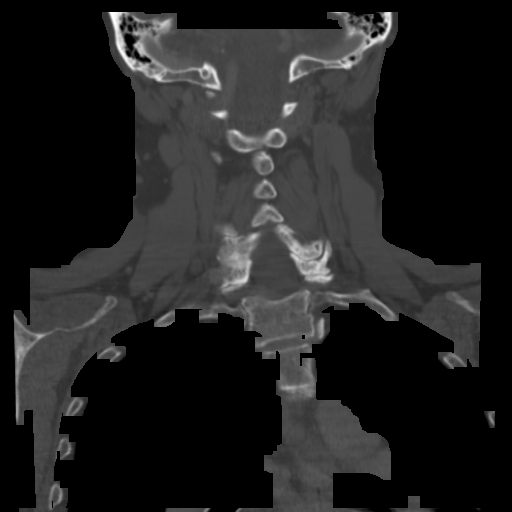

[Series 201: axial · axial · 0.47mm/px · z∈[+134,+259]mm · 3 of 128 slices shown, 4 images]
[im 32/128  soft-tissue]
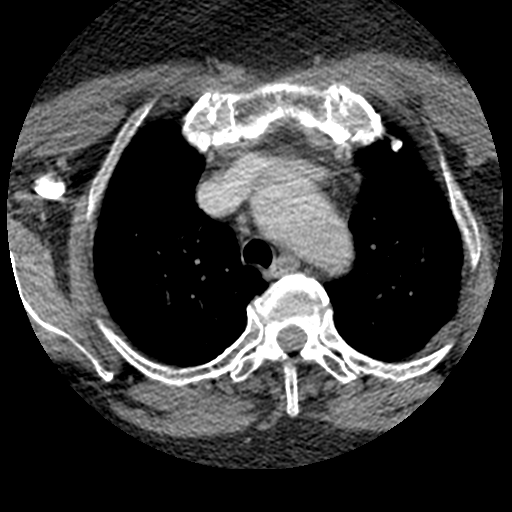
[im 32/128  bone]
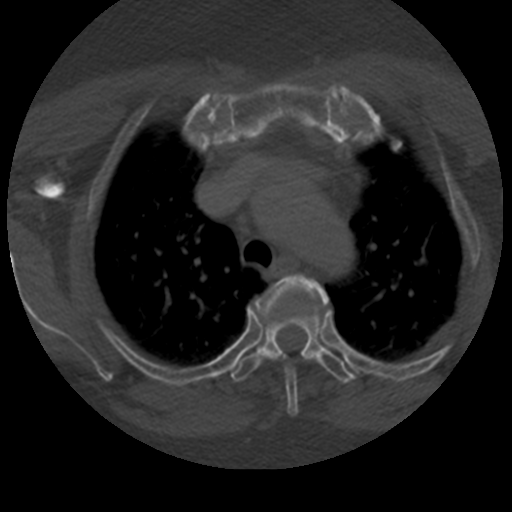
[im 64/128  bone]
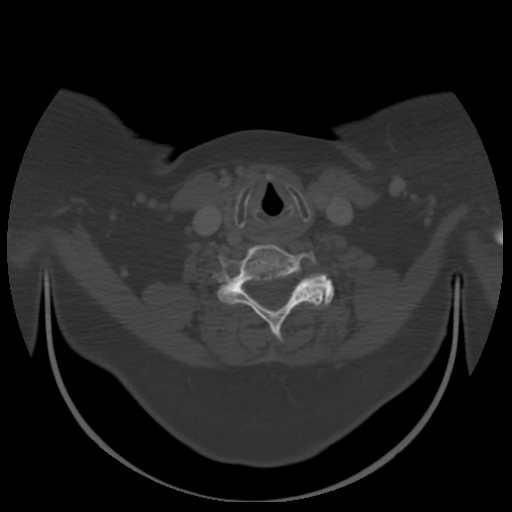
[im 96/128  bone]
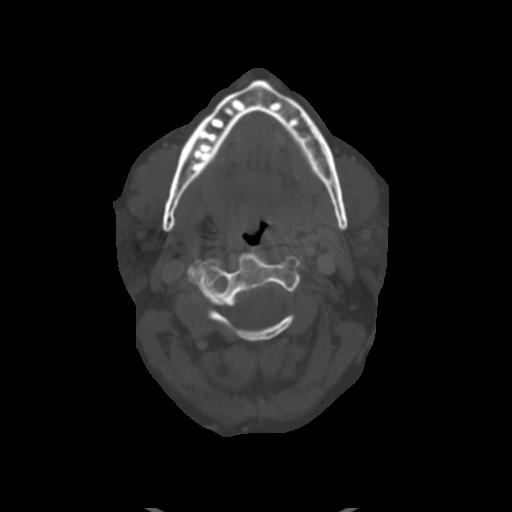

[Series 203: sag · sagittal · 0.47mm/px · 5 of 117 slices shown, 6 images]
[im 39/117  bone]
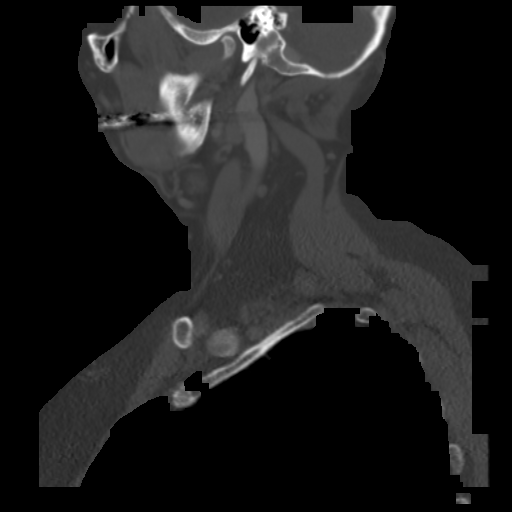
[im 49/117  bone]
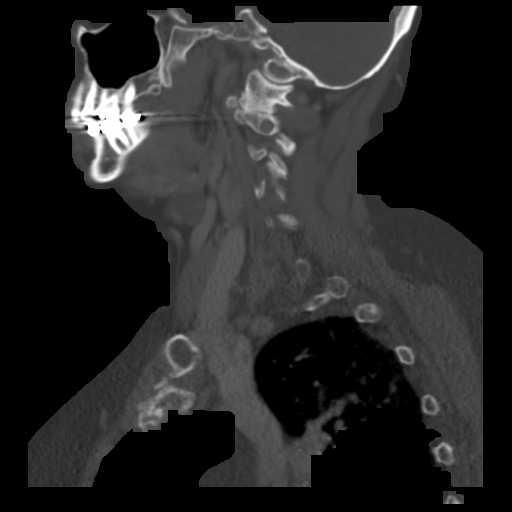
[im 59/117  soft-tissue]
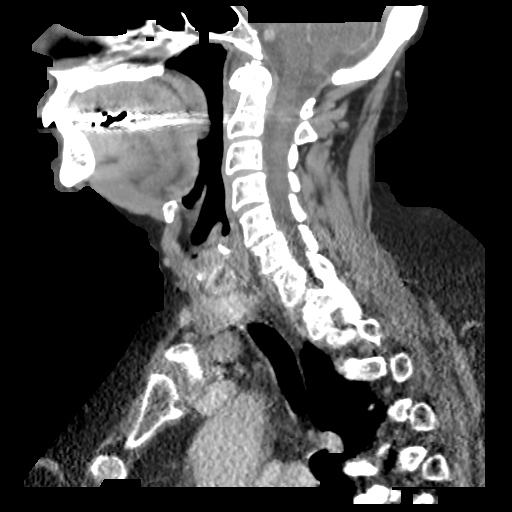
[im 59/117  bone]
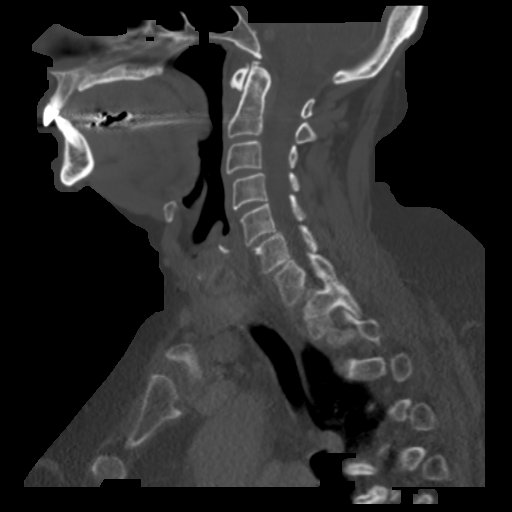
[im 68/117  bone]
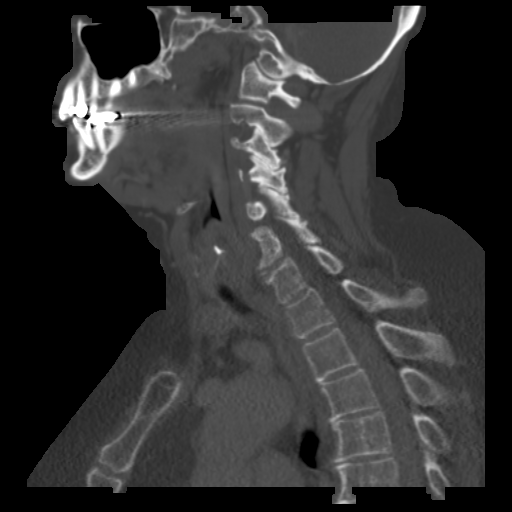
[im 78/117  bone]
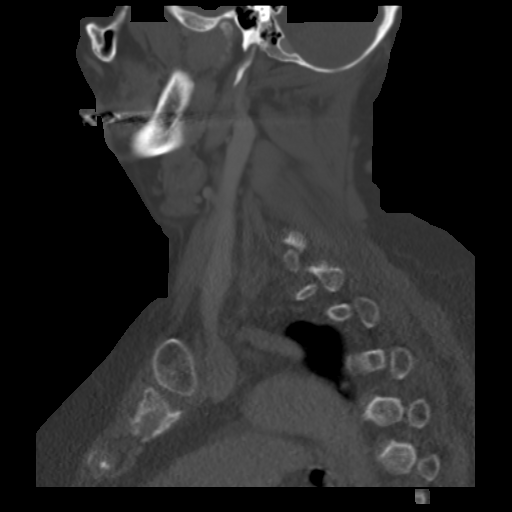

[13 of 33 positions shown; findings below may reference images not displayed]

FINDINGS: Pharynx and larynx: Negative for pharyngeal mass or edema. Mild
prominence of the tonsils which are symmetric. Larynx is symmetric.

Salivary glands: Normal salivary and submandibular glands. No mass
or calcification.

Thyroid: Normal size.  No focal mass lesion.

Lymph nodes: No pathologic adenopathy in the neck or supraclavicular
region.

Vascular: Jugular vein patent bilaterally. Mild atherosclerotic
disease in the carotid bifurcation without significant stenosis.

Limited intracranial: Negative

Visualized orbits: Negative

Mastoids and visualized paranasal sinuses: Negative

Skeleton: Cervical disc and facet degeneration. Mild anterior slip
C3-4, C4-5, and C5-6 due to disc and facet degeneration. No fracture
or mass lesion.

Upper chest: Lung apices are clear.
IMPRESSION: Negative for mass or adenopathy in the neck.  No acute abnormality

## 2015-10-23 MED ORDER — IOPAMIDOL (ISOVUE-300) INJECTION 61%
75.0000 mL | Freq: Once | INTRAVENOUS | Status: AC | PRN
  Administered 2015-10-23: 75 mL via INTRAVENOUS

## 2015-10-24 ENCOUNTER — Inpatient Hospital Stay: Admission: RE | Admit: 2015-10-24 | Payer: Medicare Other | Source: Ambulatory Visit

## 2015-10-25 DIAGNOSIS — M545 Low back pain: Secondary | ICD-10-CM | POA: Diagnosis not present

## 2015-10-25 DIAGNOSIS — G8929 Other chronic pain: Secondary | ICD-10-CM | POA: Diagnosis not present

## 2015-10-29 ENCOUNTER — Other Ambulatory Visit: Payer: Self-pay | Admitting: Neurology

## 2015-10-31 ENCOUNTER — Encounter (HOSPITAL_COMMUNITY): Payer: Self-pay

## 2015-10-31 ENCOUNTER — Ambulatory Visit (HOSPITAL_COMMUNITY)
Admission: RE | Admit: 2015-10-31 | Discharge: 2015-10-31 | Disposition: A | Discharge status: Home / Self Care | Payer: Medicare Other | Source: Ambulatory Visit | Attending: Obstetrics and Gynecology | Admitting: Obstetrics and Gynecology

## 2015-10-31 DIAGNOSIS — M810 Age-related osteoporosis without current pathological fracture: Secondary | ICD-10-CM | POA: Insufficient documentation

## 2015-10-31 MED ORDER — SODIUM CHLORIDE 0.9 % IV SOLN
INTRAVENOUS | Status: DC
  Administered 2015-10-31: 11:00:00 via INTRAVENOUS

## 2015-10-31 MED ORDER — ZOLEDRONIC ACID 5 MG/100ML IV SOLN
5.0000 mg | Freq: Once | INTRAVENOUS | Status: AC
  Administered 2015-10-31: 5 mg via INTRAVENOUS
  Filled 2015-10-31: qty 100

## 2015-10-31 NOTE — Discharge Instructions (Signed)

## 2015-11-01 DIAGNOSIS — M9902 Segmental and somatic dysfunction of thoracic region: Secondary | ICD-10-CM | POA: Diagnosis not present

## 2015-11-01 DIAGNOSIS — M9903 Segmental and somatic dysfunction of lumbar region: Secondary | ICD-10-CM | POA: Diagnosis not present

## 2015-11-01 DIAGNOSIS — R51 Headache: Secondary | ICD-10-CM | POA: Diagnosis not present

## 2015-11-01 DIAGNOSIS — S39012A Strain of muscle, fascia and tendon of lower back, initial encounter: Secondary | ICD-10-CM | POA: Diagnosis not present

## 2015-11-01 DIAGNOSIS — M9901 Segmental and somatic dysfunction of cervical region: Secondary | ICD-10-CM | POA: Diagnosis not present

## 2015-11-01 DIAGNOSIS — S29012A Strain of muscle and tendon of back wall of thorax, initial encounter: Secondary | ICD-10-CM | POA: Diagnosis not present

## 2015-11-05 DIAGNOSIS — M4317 Spondylolisthesis, lumbosacral region: Secondary | ICD-10-CM | POA: Diagnosis not present

## 2015-11-05 DIAGNOSIS — M9901 Segmental and somatic dysfunction of cervical region: Secondary | ICD-10-CM | POA: Diagnosis not present

## 2015-11-05 DIAGNOSIS — M9902 Segmental and somatic dysfunction of thoracic region: Secondary | ICD-10-CM | POA: Diagnosis not present

## 2015-11-05 DIAGNOSIS — S29012A Strain of muscle and tendon of back wall of thorax, initial encounter: Secondary | ICD-10-CM | POA: Diagnosis not present

## 2015-11-05 DIAGNOSIS — R51 Headache: Secondary | ICD-10-CM | POA: Diagnosis not present

## 2015-11-05 DIAGNOSIS — M9903 Segmental and somatic dysfunction of lumbar region: Secondary | ICD-10-CM | POA: Diagnosis not present

## 2015-11-05 DIAGNOSIS — M5137 Other intervertebral disc degeneration, lumbosacral region: Secondary | ICD-10-CM | POA: Diagnosis not present

## 2015-11-07 DIAGNOSIS — M9901 Segmental and somatic dysfunction of cervical region: Secondary | ICD-10-CM | POA: Diagnosis not present

## 2015-11-07 DIAGNOSIS — M5137 Other intervertebral disc degeneration, lumbosacral region: Secondary | ICD-10-CM | POA: Diagnosis not present

## 2015-11-07 DIAGNOSIS — R51 Headache: Secondary | ICD-10-CM | POA: Diagnosis not present

## 2015-11-07 DIAGNOSIS — M9903 Segmental and somatic dysfunction of lumbar region: Secondary | ICD-10-CM | POA: Diagnosis not present

## 2015-11-07 DIAGNOSIS — S29012A Strain of muscle and tendon of back wall of thorax, initial encounter: Secondary | ICD-10-CM | POA: Diagnosis not present

## 2015-11-07 DIAGNOSIS — M9902 Segmental and somatic dysfunction of thoracic region: Secondary | ICD-10-CM | POA: Diagnosis not present

## 2015-11-07 DIAGNOSIS — M4317 Spondylolisthesis, lumbosacral region: Secondary | ICD-10-CM | POA: Diagnosis not present

## 2015-11-15 ENCOUNTER — Ambulatory Visit: Payer: Medicare Other | Admitting: Neurology

## 2015-11-19 DIAGNOSIS — R51 Headache: Secondary | ICD-10-CM | POA: Diagnosis not present

## 2015-11-19 DIAGNOSIS — M5137 Other intervertebral disc degeneration, lumbosacral region: Secondary | ICD-10-CM | POA: Diagnosis not present

## 2015-11-19 DIAGNOSIS — M4317 Spondylolisthesis, lumbosacral region: Secondary | ICD-10-CM | POA: Diagnosis not present

## 2015-11-19 DIAGNOSIS — M9901 Segmental and somatic dysfunction of cervical region: Secondary | ICD-10-CM | POA: Diagnosis not present

## 2015-11-19 DIAGNOSIS — M9903 Segmental and somatic dysfunction of lumbar region: Secondary | ICD-10-CM | POA: Diagnosis not present

## 2015-11-19 DIAGNOSIS — M9902 Segmental and somatic dysfunction of thoracic region: Secondary | ICD-10-CM | POA: Diagnosis not present

## 2015-11-19 DIAGNOSIS — S29012A Strain of muscle and tendon of back wall of thorax, initial encounter: Secondary | ICD-10-CM | POA: Diagnosis not present

## 2015-11-22 DIAGNOSIS — R0602 Shortness of breath: Secondary | ICD-10-CM | POA: Diagnosis not present

## 2015-11-22 DIAGNOSIS — M4317 Spondylolisthesis, lumbosacral region: Secondary | ICD-10-CM | POA: Diagnosis not present

## 2015-11-22 DIAGNOSIS — M545 Low back pain: Secondary | ICD-10-CM | POA: Diagnosis not present

## 2015-11-22 DIAGNOSIS — M9903 Segmental and somatic dysfunction of lumbar region: Secondary | ICD-10-CM | POA: Diagnosis not present

## 2015-11-22 DIAGNOSIS — R51 Headache: Secondary | ICD-10-CM | POA: Diagnosis not present

## 2015-11-22 DIAGNOSIS — M9902 Segmental and somatic dysfunction of thoracic region: Secondary | ICD-10-CM | POA: Diagnosis not present

## 2015-11-22 DIAGNOSIS — R05 Cough: Secondary | ICD-10-CM | POA: Diagnosis not present

## 2015-11-22 DIAGNOSIS — S29012A Strain of muscle and tendon of back wall of thorax, initial encounter: Secondary | ICD-10-CM | POA: Diagnosis not present

## 2015-11-22 DIAGNOSIS — G8929 Other chronic pain: Secondary | ICD-10-CM | POA: Diagnosis not present

## 2015-11-22 DIAGNOSIS — M9901 Segmental and somatic dysfunction of cervical region: Secondary | ICD-10-CM | POA: Diagnosis not present

## 2015-11-22 DIAGNOSIS — J45909 Unspecified asthma, uncomplicated: Secondary | ICD-10-CM | POA: Diagnosis not present

## 2015-11-22 DIAGNOSIS — M5137 Other intervertebral disc degeneration, lumbosacral region: Secondary | ICD-10-CM | POA: Diagnosis not present

## 2015-11-22 DIAGNOSIS — J399 Disease of upper respiratory tract, unspecified: Secondary | ICD-10-CM | POA: Diagnosis not present

## 2015-11-26 DIAGNOSIS — E559 Vitamin D deficiency, unspecified: Secondary | ICD-10-CM | POA: Diagnosis not present

## 2015-11-26 DIAGNOSIS — K219 Gastro-esophageal reflux disease without esophagitis: Secondary | ICD-10-CM | POA: Diagnosis not present

## 2015-11-26 DIAGNOSIS — I10 Essential (primary) hypertension: Secondary | ICD-10-CM | POA: Diagnosis not present

## 2015-11-26 DIAGNOSIS — R05 Cough: Secondary | ICD-10-CM | POA: Diagnosis not present

## 2015-11-30 DIAGNOSIS — D509 Iron deficiency anemia, unspecified: Secondary | ICD-10-CM | POA: Diagnosis not present

## 2015-11-30 DIAGNOSIS — E538 Deficiency of other specified B group vitamins: Secondary | ICD-10-CM | POA: Diagnosis not present

## 2015-11-30 DIAGNOSIS — I10 Essential (primary) hypertension: Secondary | ICD-10-CM | POA: Diagnosis not present

## 2015-11-30 DIAGNOSIS — E559 Vitamin D deficiency, unspecified: Secondary | ICD-10-CM | POA: Diagnosis not present

## 2015-11-30 DIAGNOSIS — F909 Attention-deficit hyperactivity disorder, unspecified type: Secondary | ICD-10-CM | POA: Diagnosis not present

## 2015-12-04 DIAGNOSIS — M9903 Segmental and somatic dysfunction of lumbar region: Secondary | ICD-10-CM | POA: Diagnosis not present

## 2015-12-04 DIAGNOSIS — M9901 Segmental and somatic dysfunction of cervical region: Secondary | ICD-10-CM | POA: Diagnosis not present

## 2015-12-04 DIAGNOSIS — R51 Headache: Secondary | ICD-10-CM | POA: Diagnosis not present

## 2015-12-04 DIAGNOSIS — M5137 Other intervertebral disc degeneration, lumbosacral region: Secondary | ICD-10-CM | POA: Diagnosis not present

## 2015-12-04 DIAGNOSIS — M9902 Segmental and somatic dysfunction of thoracic region: Secondary | ICD-10-CM | POA: Diagnosis not present

## 2015-12-04 DIAGNOSIS — M4317 Spondylolisthesis, lumbosacral region: Secondary | ICD-10-CM | POA: Diagnosis not present

## 2015-12-04 DIAGNOSIS — S29012A Strain of muscle and tendon of back wall of thorax, initial encounter: Secondary | ICD-10-CM | POA: Diagnosis not present

## 2015-12-05 ENCOUNTER — Telehealth: Payer: Self-pay | Admitting: Neurology

## 2015-12-05 ENCOUNTER — Encounter: Payer: Self-pay | Admitting: Neurology

## 2015-12-05 ENCOUNTER — Ambulatory Visit (INDEPENDENT_AMBULATORY_CARE_PROVIDER_SITE_OTHER): Payer: Medicare Other | Admitting: Neurology

## 2015-12-05 VITALS — BP 130/82 | HR 76 | Resp 20 | Ht 67.0 in | Wt 245.0 lb

## 2015-12-05 DIAGNOSIS — M5030 Other cervical disc degeneration, unspecified cervical region: Secondary | ICD-10-CM | POA: Diagnosis not present

## 2015-12-05 DIAGNOSIS — K909 Intestinal malabsorption, unspecified: Secondary | ICD-10-CM

## 2015-12-05 DIAGNOSIS — G2581 Restless legs syndrome: Secondary | ICD-10-CM | POA: Diagnosis not present

## 2015-12-05 DIAGNOSIS — M542 Cervicalgia: Secondary | ICD-10-CM

## 2015-12-05 DIAGNOSIS — G4733 Obstructive sleep apnea (adult) (pediatric): Secondary | ICD-10-CM

## 2015-12-05 DIAGNOSIS — Z9884 Bariatric surgery status: Secondary | ICD-10-CM | POA: Diagnosis not present

## 2015-12-05 DIAGNOSIS — Z9989 Dependence on other enabling machines and devices: Secondary | ICD-10-CM

## 2015-12-05 MED ORDER — KETOROLAC TROMETHAMINE 30 MG/ML IJ SOLN
30.0000 mg | Freq: Once | INTRAMUSCULAR | Status: AC
  Administered 2015-12-05: 30 mg via INTRAMUSCULAR

## 2015-12-05 MED ORDER — KETOROLAC TROMETHAMINE 30 MG/ML IJ SOLN: INTRAMUSCULAR | Status: DC

## 2015-12-05 MED ORDER — QUININE SULFATE 324 MG PO CAPS: ORAL_CAPSULE | ORAL | Status: DC

## 2015-12-05 MED ORDER — KETOROLAC TROMETHAMINE 30 MG/ML IJ SOLN: 30.0000 mg | Freq: Once | INTRAMUSCULAR | Status: DC

## 2015-12-05 NOTE — Telephone Encounter (Signed)
Spoke to [employer], Software engineer, at [facility]. He reports that this order is unusual, but they would be able to order vials for her. He wanted to clarify how often the pt should inject the IM ketorolac and if it was ok to substitute for the generic form of ketorolac. Dr. Brett Fairy and pt were actually still in exam room, so I clarified with them both this information. Dr. Brett Fairy wants to give the pt 10 vials of ketorolac 30 mg/mL, to be given IM, and pt may inject herself once a week as needed, but not more than once per week. Generic substitution is ok.  Updated RX sent to the [facility].

## 2015-12-05 NOTE — Addendum Note (Signed)
Addended by: [place] A on: 12/05/2015 10:53 AM   Modules accepted: Orders

## 2015-12-05 NOTE — Telephone Encounter (Signed)
[facility] called regarding ketorolac (TORADOL) 30 MG/ML injection, written for injection which is unusual, inject into muscle once, enough for 20 days usually done 5 days, not sure that any pharmacy carries the injection. Please call to clarify.

## 2015-12-05 NOTE — Patient Instructions (Signed)
Ketorolac injection What is this medicine? KETOROLAC (kee toe ROLE ak) is a non-steroidal anti-inflammatory drug (NSAID). It is used to treat moderate to severe pain for up to 5 days. It is commonly used after surgery. This medicine should not be used for more than 5 days. This medicine may be used for other purposes; ask your health care provider or pharmacist if you have questions. What should I tell my health care provider before I take this medicine? They need to know if you have any of these conditions: -asthma, especially aspirin-sensitive asthma -bleeding problems -kidney disease -stomach bleed, ulcer, or other problem -taking aspirin, other NSAID, or probenecid -an unusual or allergic reaction to ketorolac, tromethamine, aspirin, other NSAIDs, other medicines, foods, dyes or preservatives -pregnant or trying to get pregnant -breast-feeding How should I use this medicine? This medicine is for injection into a muscle or into a vein. It is given by a health care professional in a hospital or clinic setting. Talk to your pediatrician regarding the use of this medicine in children. While this drug may be prescribed for children as young as 2 years old for selected conditions, precautions do apply. Patients over 65 years old may have a stronger reaction and need a smaller dose. Overdosage: If you think you have taken too much of this medicine contact a poison control center or emergency room at once. NOTE: This medicine is only for you. Do not share this medicine with others. What if I miss a dose? This does not apply. What may interact with this medicine? Do not take this medicine with any of the following medications: -aspirin and aspirin-like medicines -cidofovir -methotrexate -NSAIDs, medicines for pain and inflammation, like ibuprofen or naproxen -pentoxifylline -probenecid This medicine may also interact with the following  medications: -alcohol -alendronate -alprazolam -carbamazepine -diuretics -flavocoxid -fluoxetine -ginkgo -lithium -medicines for blood pressure like enalapril -medicines that affect platelets like pentoxifylline -medicines that treat or prevent blood clots like heparin, warfarin -muscle relaxants -pemetrexed -phenytoin -thiothixene This list may not describe all possible interactions. Give your health care provider a list of all the medicines, herbs, non-prescription drugs, or dietary supplements you use. Also tell them if you smoke, drink alcohol, or use illegal drugs. Some items may interact with your medicine. What should I watch for while using this medicine? Tell your doctor or healthcare professional if your symptoms do not start to get better or if they get worse. This medicine does not prevent heart attack or stroke. In fact, this medicine may increase the chance of a heart attack or stroke. The chance may increase with longer use of this medicine and in people who have heart disease. If you take aspirin to prevent heart attack or stroke, talk with your doctor or health care professional. Do not take medicines such as ibuprofen and naproxen with this medicine. Side effects such as stomach upset, nausea, or ulcers may be more likely to occur. Many medicines available without a prescription should not be taken with this medicine. This medicine can cause ulcers and bleeding in the stomach and intestines at any time during treatment. Do not smoke cigarettes or drink alcohol. These increase irritation to your stomach and can make it more susceptible to damage from this medicine. Ulcers and bleeding can happen without warning symptoms and can cause death. This medicine can cause you to bleed more easily. Try to avoid damage to your teeth and gums when you brush or floss your teeth. What side effects may I notice from receiving   this medicine? Side effects that you should report to your  doctor or health care professional as soon as possible: -allergic reactions like skin rash, itching or hives, swelling of the face, lips, or tongue -breathing problems -high blood pressure -nausea, vomiting -redness, blistering, peeling or loosening of the skin, including inside the mouth -severe stomach pain -signs and symptoms of bleeding such as bloody or black, tarry stools; red or dark-brown urine; spitting up blood or brown material that looks like coffee grounds; red spots on the skin; unusual bruising or bleeding from the eye, gums, or nose -signs and symptoms of a blood clot changes in vision; chest pain; severe, sudden headache; trouble speaking; sudden numbness or weakness of the face, arm, or leg -trouble passing urine or change in the amount of urine -unexplained weight gain or swelling -unusually weak or tired -yellowing of eyes or skin Side effects that usually do not require medical attention (report to your doctor or health care professional if they continue or are bothersome): -diarrhea -dizziness -headache -heartburn This list may not describe all possible side effects. Call your doctor for medical advice about side effects. You may report side effects to FDA at 1-800-FDA-1088. Where should I keep my medicine? This drug is given in a hospital or clinic and will not be stored at home. NOTE: This sheet is a summary. It may not cover all possible information. If you have questions about this medicine, talk to your doctor, pharmacist, or health care provider.    2016, Elsevier/Gold Standard. (2013-04-19 16:34:56)  

## 2015-12-05 NOTE — Progress Notes (Signed)
[facility]   Provider:  Larey Seat, M D  Referring Provider: [name], MD Primary Care Physician:    Chief Complaint  Patient presents with  . Follow-up    cpap, forgot machine, RLS follow up, rm 11, alone    HPI:  [name] is a 69 y.o. female  Is seen here as a revisit for snoring and restless legs.  I have followed [name] from 2003 through 2008 in the past. The patient had undergone doing this. A gastric bypass surgery which led to an extensive loss of body weight. In the process she was no longer suffering from obstructive sleep apnea related to the weight loss, but she developed restless legs. Repeated iron studies had shown that she had extremely low ferritin levels. A ferritin level lower than 50 is associated with a much higher incidence of restless leg syndrome in addition her iron and iron binding capacities have been surprisingly normal and this conflict of data we have discussed today. Usually I would treat the patient with Ferrlecit and infusion once every 3 months or so to keep up ferritin levels in in her case I am not sure how much free iron is actually in her system. 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 In addition she is vitamin D deficient which are almost 80% of adult Americans. Gained some weight back but she has no longer nausea or dumping syndrome so therefore she does not need Phenergan or Zofran. She is using gabapentin and ropinirole to treat her restless legs she often wakes up with a headache and she suffers from at times severe crippling migraines. Leg cramps also interfere with her sleep the initiation of sleep as well as the sleep duration at night.  As her BMI exceeds 35, she may also have developed apnea again and snoring has been witnessed by her family. We are meeting today to reevaluate all these conditions in context. She is also excessively daytime sleepy for fatigue severity score is endorsed  at 63 points her Epworth sleepiness score was endorsed at 15 points. Bedtime is usually 10 Pm and her husband leaves for work at 6.30 AM , after that she will sleep for 2-3 hours. Total daily sleep time is 14 hours, this is more likely related to depression.   Interval history from 08-16-15 [name] reports that the iron infusion that she received under [name] care helped her greatly for while . By July 2016 she was supposed to follow-up with him but he has meanwhile started her teaching job at the hospital and was not able to follow up.  So she is here again because her restless legs are acting up again. She has always responded to iron infusions and as she is status post gastric bypass surgery she does not have a normal absorption for by mouth iron. Her ferritin levels have been clinically and critically low. She underwent a repeat sleep study on 01-10-15 after endorsing the Epworth Sleepiness Scale at 19 points.   Her AHI was 30.3 her RDI was 38.6 supine AHI was 44.5. Was titrated to 10 cm water pressure the titration was not optimal. She was therefore prescribed an hour to titrate her. Now her residual AHI is 2.8 and she remains on an O2 sat at 7 and 15 cm water with full-time EPR of 3 cm. I'm able to obtain a download here in office and reviewed with the patient she has been 100% compliance  for days of use, 87% compliance for over 4 hours of consecutive use and average user time is 6 hours 49 minutes. 95th percentile pressure is 11.5 cm water.  Her restless leg syndrome quality of life questionnaire was endorsed at moderate to severe impairment and the RLS 6 rating scale was endorsed at severe impairment. We are going to infuse her once again with iron. She is on Requip 2 mg total. I will see if I can enroll her into our restless leg study. The patient just had a follow-up appointment with Dr. Maudie [name] her primary care physician,   Today is 12-05-15.  #1   She is doing moderately well with  her CPAP use was a compliance of 73% average user time on days of use is 4 hours and 44 minutes. Minimum pressure for this AutoSet 7 maximum 15 cm water. The patient's AHI was 2.5 which is a desirable result. The 95th percentile pressure is 12.3. The patient could not use the machine for about 2 weeks after she contracted an upper respiratory infection doing a vacation stay in [place]. She is now back to using it regularly.  #2 her restless leg question here of was endorsed at moderate impairment quality of life.  It seems that her iron levels have rebounded to some degree , but ferritin results are pending. Her geriatric depression score does not indicate depression her Epworth sleepiness score was endorsed at 8 points and her fatigue severity score 26 points.  #3 headache are a concern. She hates the pain clinic. We are not providing pain management in this clinic but I do feel that the patient could change with the same narcotic agreement and contract to Dr. Maudie [name]  Her main care provider. She has no history of overusing up using or accidentally overdosing any medications. Her depression scores are in normal range not indicated clinical depression at all. The patient is aware that she could receive with a headache attack Depakote IV. However I am not thrilled with the opportunity of preventing migrainous headaches by Depakote, given the risk of weight gain, liver failure tremor. She has been on a beta blocker which has helped to some degree. She would be interested in some kind of injection therapy but I explained that Botox injections in the Medicare population on very difficult to get paid for. And Botox is an extremely costly medication these days. The patient has some basic medical training and she would be willing to give herself a shot. We discussed TORADOL  which can be taken either as a 10 mg 2 pill or as an IM medication.  #4 patient will receive 1 mL equaling 2,000,000 g of Toradol intramuscular  injection today for her acute headache. If this works well for her it could be an alternative treatment and hopefully will relieve her of the need to take narcotics to. The patient is a status post gastric bypass surgery so she cannot have nonsteroidal by mouth .   Review of Systems: Out of a complete 14 system review, the patient complains of only the following symptoms, and all other reviewed systems are negative. Restless legs she often wakes up with a headache and she suffers from at times severe crippling migraines.  Leg cramps also interfere with her sleep the initiation of sleep as well as the sleep duration at night.  As her  BMI exceeds 35,  she may also have developed apnea again and snoring has been witnessed by her family. We are meeting today to  reevaluate all these conditions in context. She is also excessively daytime sleepy for fatigue severity score is endorsed at 63 points her Epworth sleepiness score was endorsed at 15 points  Her daughter [name] and her mother have been extremely sleepy. Family history addendum  Epworth score 3 from 15 , Fatigue severity score  34 from 63  , depression score 3. She started exercising after knee surgery .  She cared for her father , who was living in the house next door , and died at age 68.   Social History   Social History  . Marital Status: Married    Spouse Name: [name]  . Number of Children: 3  . Years of Education: college   Occupational History  . Retired    Social History Main Topics  . Smoking status: Never Smoker   . Smokeless tobacco: Not on file  . Alcohol Use: No  . Drug Use: No  . Sexual Activity: Yes    Birth Control/ Protection: Post-menopausal   Other Topics Concern  . Not on file   Social History Narrative   Caffeine 8-10 cups daily.  (coffee 1 cup am, unsw tea all day long).     Family History  Problem Relation Age of Onset  . CAD Mother   . Hypertension Mother   . Neuropathy Mother   .  Osteoarthritis Mother   . Heart disease Mother   . Breast cancer Maternal Grandmother   . CAD Paternal Grandfather   . Colon cancer Father     Past Medical History  Diagnosis Date  . Sleep apnea     uses CPAP  . H/O hiatal hernia     surgery for hernia  . Arthritis     generalized  . Leg cramps     takes Flexeril daily as needed  . Restless leg syndrome     takes Requip daily  . Vitamin D deficiency   . Osteoporosis   . Tubular adenoma of colon   . Gestational diabetes   . Diverticulosis     benign  . Dyslipidemia   . Atrophic vaginitis   . DJD (degenerative joint disease)   . Vertigo   . 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 Depression     takes Cymbalta daily  . Insomnia     takes Ambien nightly  . GERD (gastroesophageal reflux disease)     takes Nexium daily  . Dysrhythmia   . Asthma   . Pneumonia 98yrs ago    hx of  . History of bronchitis 1 yr ago  . Headache(784.0)     takes Imitrex daily as needed and Bisoprolol daily;last migraine was about 2wks ago  . Peripheral neuropathy (HCC)     takes Gabapentin daily  . Joint pain   . Joint swelling   . Back pain     DDD/stenosis  . Family history of GI bleeding   . Nocturia   . 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 History of shingles   . RLS (restless legs syndrome) 08/16/2015  . Carotid stenosis     Carotid US 10/16: Plaque RICA (123456), normal LICA    Past Surgical History  Procedure Laterality Date  . Gastric bypass  2001  . Skin reduction  2003 2004    stomach and arm  . Wisdom tooth extraction    . Excess skin removal      post weight loss  . Hernia  repair      umbilical  . Colonoscopy    . Esophagogastroduodenoscopy    . Joint replacement Right     knee  . Steroid injection to scar      x 2 in back   . 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 amphetamine-dextroamphetamine (ADDERALL) 10 MG tablet Take 10 mg by mouth daily with breakfast.   0  . bisoprolol (ZEBETA) 10 MG tablet Take 10 mg by mouth 2 (two) times daily.    . calcium carbonate (OS-CAL) 1250 (500 CA) MG chewable tablet Chew 1 tablet by mouth daily. Not sure of dosage    . DULoxetine (CYMBALTA) 30 MG capsule Take 30 mg by mouth 2 (two) times daily.     Marland Kitchen esomeprazole (NEXIUM) 40 MG capsule Take 40 mg by mouth 2 (two) times daily before a meal.   6  . Fexofenadine HCl (ALLEGRA ALLERGY PO) Take 1 tablet by mouth daily as needed (AS NEED FOR ALLERGIES).     Marland Kitchen gabapentin (NEURONTIN) 300 MG capsule Take 1 capsule (300 mg total) by mouth 3 (three) times daily. 90 capsule 5  . oxyCODONE (OXY IR/ROXICODONE) 5 MG immediate release tablet Take 5 mg by mouth daily as needed for severe pain.     . quiNINE (QUALAQUIN) 324 MG capsule TAKE 1 CAPSULE AFTER LUNCH AND AFTER DINNER. 60 capsule 6  . rOPINIRole (REQUIP) 1 MG tablet TAKE 1 TABLET(1 MG) BY MOUTH AT BEDTIME AS NEEDED. MAY REPEAT DOSE 1 TIME AS NEEDED 180 tablet 0  . tiZANidine (ZANAFLEX) 4 MG tablet Take 4 mg by mouth 3 (three) times daily.    . Vitamin D, Ergocalciferol, (DRISDOL) 50000 UNITS CAPS capsule Take 1 capsule (50,000 Units total) by mouth every 7 (seven) days. 12 capsule 1  . zolpidem (AMBIEN) 10 MG tablet Take 10 mg by mouth at bedtime.   4   No current facility-administered medications for this visit.    Allergies as of 12/05/2015  . (No Known Allergies)    Vitals: BP 130/82 mmHg  Pulse 76  Resp 20  Ht 5\' 7"  (1.702 m)  Wt 245 lb (111.131 kg)  BMI 38.36 kg/m2 Last Weight:  Wt Readings from Last 1 Encounters:  12/05/15 245 lb (111.131 kg)       Last Height:   Ht Readings from Last 1 Encounters:  12/05/15 5\' 7"  (1.702 m)    Physical exam:  General: The patient is awake, alert and appears not in acute distress. The patient is  well groomed. Head: Normocephalic, atraumatic. Neck is supple. Mallampati 3 ,  neck circumference: 15 inches. Nasal airflow unrestricted, TMJ is not evident. Retrognathia is seen.  Cardiovascular:  Regular rate and rhythm, without  murmurs or carotid bruit, and without distended neck veins. Respiratory: Lungs are clear to auscultation. Skin:  Without evidence of edema, or rash, well healed knee replacement scars.  Trunk: BMI is elevated .  Neurologic exam :The patient is awake and alert, oriented to place and time.  Memory subjective described as intact.  There is a normal attention span & concentration ability. Speech is fluent without  dysarthria, dysphonia or aphasia. Mood and affect are appropriate. Cranial nerves: Pupils are equal and briskly reactive to light.  Extraocular movements  in vertical and horizontal planes  intact and without nystagmus. Visual fields by finger perimetry are intact. Hearing to finger rub intact.  Facial sensation intact to fine touch. Facial motor strength is symmetric and tongue and uvula move midline. Shoulder shrug intact . Motor exam:   Normal tone, muscle bulk and symmetric strength in all extremities. No cog wheeling and no waxy or clasp knife response.  Sensory:  Fine touch, pinprick and vibration were tested in all extremities. Proprioception in both feet is affeced by pin and needle dysesthesias.  Coordination: Rapid alternating movements in the fingers/hands is normal. Finger-to-nose maneuver normal without evidence of ataxia, dysmetria or tremor. Gait and station: Patient walks without assistive device and is able unassisted to climb up to the exam table. Strength within normal limits. Stance is stable and normal.  Deep tendon reflexes: in the  upper and lower extremities are symmetric and intact. Babinski maneuver downgoing.   Assessment:  After physical and neurologic examination, review of laboratory studies, imaging, neurophysiology testing and  pre-existing records, assessment is   The patient was advised of the nature of the diagnosed sleep disorder , the treatment options and risks for general a health and wellness arising from not treating the condition. Visit duration was 40 minutes.  Of today's new patient visit-consultation 50% of our face-to-face time are spent in discussion of the medical conditions found the differential diagnosis the testing and treatment options for the conditions. The patient is willing to undergo a new iron infusion cycle .  Her cardiologists note's support this Pernell Dupre ,MD ) , Dr. Waymon Budge.  Since Mrs. score has undergone a gastric bypass surgery in the past she has some mild absorption issues.  She is chronically iron deficient, she has been vitamin D deficient she has been vitamin B12 deficient. I have prescribed her a vitamin B12 nasal spray, vitamin D 50,000 units weekly pill, and I will obtain iron studies today to see which dose of an iron infusion would be indicated for her. To treat her remaining insomnia problem in spite of well treated obstructive sleep apnea I have given her samples of BellS[place]ra she will have a coupon so-called loyalty card that she can use at the pharmacy for 10  tabs of 15 mg. I hope this will replace her ambien.   Plan:  Treatment plan and additional workup :  1 RLS with iron deficiency ) Dr. Beryle Beams  evaluated her discrepancy between very low ferritin levels  and apparently sufficient iron storage. She has maintained oral iron intake as well as multivitamins and multi minerals. I will order IV iron for her.   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 She is not diabetic she has no documented thyroid disease and neither did her mother nor has her daughter but all of these women half severe painful dysesthesias leg cramps and restless legs. She took quinine with good success, but had to order from [place].   3) OSA- continue on CPAP, auto PAP . Doing well !  4) Her headaches  continued , inspite of high CPAP compliance. Not  sleep related. She wakes up with migraines . She is unable to sleep off the headache. She does have a visual aura, nausea. She takes hydrocodone, prescribed by Dr Paulita Fujita? She hates the pain clinic, and I will try to get her relief with Toradol IM. Dr Maudie [name] will be by her  asked to take over the pain management.  5) arthritis pain, improved after knee surgery, Dr. Rhona Raider .    IM treatment  in office visit.     [name] Janice Bow MD  12/05/2015

## 2015-12-05 NOTE — Progress Notes (Signed)
Ketorolac IM injection given to pt in R deltoid, 30 mg/mL. LOT:[id number]     EXP: 10/15/2016  Pt tolerated procedure well. Alcohol applied to site before injection, band aid applied after injection. Pt reports complete relief of headache after injection.

## 2015-12-05 NOTE — Progress Notes (Signed)
Administered ketorolac IM 30mg /1 mL to pt's R deltoid per Dr. Brett Fairy order. Pt reported 30 mins after injection that her head and neck pain were gone.

## 2015-12-06 DIAGNOSIS — E559 Vitamin D deficiency, unspecified: Secondary | ICD-10-CM | POA: Diagnosis not present

## 2015-12-06 DIAGNOSIS — Z0000 Encounter for general adult medical examination without abnormal findings: Secondary | ICD-10-CM | POA: Diagnosis not present

## 2015-12-06 DIAGNOSIS — M9903 Segmental and somatic dysfunction of lumbar region: Secondary | ICD-10-CM | POA: Diagnosis not present

## 2015-12-06 DIAGNOSIS — M9901 Segmental and somatic dysfunction of cervical region: Secondary | ICD-10-CM | POA: Diagnosis not present

## 2015-12-06 DIAGNOSIS — M9902 Segmental and somatic dysfunction of thoracic region: Secondary | ICD-10-CM | POA: Diagnosis not present

## 2015-12-06 DIAGNOSIS — R739 Hyperglycemia, unspecified: Secondary | ICD-10-CM | POA: Diagnosis not present

## 2015-12-06 DIAGNOSIS — M5137 Other intervertebral disc degeneration, lumbosacral region: Secondary | ICD-10-CM | POA: Diagnosis not present

## 2015-12-06 DIAGNOSIS — I10 Essential (primary) hypertension: Secondary | ICD-10-CM | POA: Diagnosis not present

## 2015-12-06 DIAGNOSIS — S29012A Strain of muscle and tendon of back wall of thorax, initial encounter: Secondary | ICD-10-CM | POA: Diagnosis not present

## 2015-12-06 DIAGNOSIS — R51 Headache: Secondary | ICD-10-CM | POA: Diagnosis not present

## 2015-12-06 DIAGNOSIS — M4317 Spondylolisthesis, lumbosacral region: Secondary | ICD-10-CM | POA: Diagnosis not present

## 2015-12-12 DIAGNOSIS — R51 Headache: Secondary | ICD-10-CM | POA: Diagnosis not present

## 2015-12-12 DIAGNOSIS — M4317 Spondylolisthesis, lumbosacral region: Secondary | ICD-10-CM | POA: Diagnosis not present

## 2015-12-12 DIAGNOSIS — M9903 Segmental and somatic dysfunction of lumbar region: Secondary | ICD-10-CM | POA: Diagnosis not present

## 2015-12-12 DIAGNOSIS — M9902 Segmental and somatic dysfunction of thoracic region: Secondary | ICD-10-CM | POA: Diagnosis not present

## 2015-12-12 DIAGNOSIS — M9901 Segmental and somatic dysfunction of cervical region: Secondary | ICD-10-CM | POA: Diagnosis not present

## 2015-12-12 DIAGNOSIS — S29012A Strain of muscle and tendon of back wall of thorax, initial encounter: Secondary | ICD-10-CM | POA: Diagnosis not present

## 2015-12-12 DIAGNOSIS — M5137 Other intervertebral disc degeneration, lumbosacral region: Secondary | ICD-10-CM | POA: Diagnosis not present

## 2015-12-13 DIAGNOSIS — M4317 Spondylolisthesis, lumbosacral region: Secondary | ICD-10-CM | POA: Diagnosis not present

## 2015-12-13 DIAGNOSIS — S29012A Strain of muscle and tendon of back wall of thorax, initial encounter: Secondary | ICD-10-CM | POA: Diagnosis not present

## 2015-12-13 DIAGNOSIS — M9902 Segmental and somatic dysfunction of thoracic region: Secondary | ICD-10-CM | POA: Diagnosis not present

## 2015-12-13 DIAGNOSIS — M5137 Other intervertebral disc degeneration, lumbosacral region: Secondary | ICD-10-CM | POA: Diagnosis not present

## 2015-12-13 DIAGNOSIS — M9903 Segmental and somatic dysfunction of lumbar region: Secondary | ICD-10-CM | POA: Diagnosis not present

## 2015-12-13 DIAGNOSIS — M9901 Segmental and somatic dysfunction of cervical region: Secondary | ICD-10-CM | POA: Diagnosis not present

## 2015-12-13 DIAGNOSIS — R51 Headache: Secondary | ICD-10-CM | POA: Diagnosis not present

## 2015-12-20 DIAGNOSIS — M545 Low back pain: Secondary | ICD-10-CM | POA: Diagnosis not present

## 2015-12-20 DIAGNOSIS — G8929 Other chronic pain: Secondary | ICD-10-CM | POA: Diagnosis not present

## 2015-12-20 DIAGNOSIS — G894 Chronic pain syndrome: Secondary | ICD-10-CM | POA: Diagnosis not present

## 2015-12-27 DIAGNOSIS — M9903 Segmental and somatic dysfunction of lumbar region: Secondary | ICD-10-CM | POA: Diagnosis not present

## 2015-12-27 DIAGNOSIS — M4317 Spondylolisthesis, lumbosacral region: Secondary | ICD-10-CM | POA: Diagnosis not present

## 2015-12-27 DIAGNOSIS — S29012A Strain of muscle and tendon of back wall of thorax, initial encounter: Secondary | ICD-10-CM | POA: Diagnosis not present

## 2015-12-27 DIAGNOSIS — M9902 Segmental and somatic dysfunction of thoracic region: Secondary | ICD-10-CM | POA: Diagnosis not present

## 2015-12-27 DIAGNOSIS — M9901 Segmental and somatic dysfunction of cervical region: Secondary | ICD-10-CM | POA: Diagnosis not present

## 2015-12-27 DIAGNOSIS — R51 Headache: Secondary | ICD-10-CM | POA: Diagnosis not present

## 2015-12-27 DIAGNOSIS — M5137 Other intervertebral disc degeneration, lumbosacral region: Secondary | ICD-10-CM | POA: Diagnosis not present

## 2016-01-01 DIAGNOSIS — M9901 Segmental and somatic dysfunction of cervical region: Secondary | ICD-10-CM | POA: Diagnosis not present

## 2016-01-01 DIAGNOSIS — M9902 Segmental and somatic dysfunction of thoracic region: Secondary | ICD-10-CM | POA: Diagnosis not present

## 2016-01-01 DIAGNOSIS — R51 Headache: Secondary | ICD-10-CM | POA: Diagnosis not present

## 2016-01-01 DIAGNOSIS — M5137 Other intervertebral disc degeneration, lumbosacral region: Secondary | ICD-10-CM | POA: Diagnosis not present

## 2016-01-01 DIAGNOSIS — M4317 Spondylolisthesis, lumbosacral region: Secondary | ICD-10-CM | POA: Diagnosis not present

## 2016-01-01 DIAGNOSIS — S29012A Strain of muscle and tendon of back wall of thorax, initial encounter: Secondary | ICD-10-CM | POA: Diagnosis not present

## 2016-01-01 DIAGNOSIS — M9903 Segmental and somatic dysfunction of lumbar region: Secondary | ICD-10-CM | POA: Diagnosis not present

## 2016-01-04 DIAGNOSIS — M9901 Segmental and somatic dysfunction of cervical region: Secondary | ICD-10-CM | POA: Diagnosis not present

## 2016-01-04 DIAGNOSIS — R51 Headache: Secondary | ICD-10-CM | POA: Diagnosis not present

## 2016-01-04 DIAGNOSIS — M9903 Segmental and somatic dysfunction of lumbar region: Secondary | ICD-10-CM | POA: Diagnosis not present

## 2016-01-04 DIAGNOSIS — M9902 Segmental and somatic dysfunction of thoracic region: Secondary | ICD-10-CM | POA: Diagnosis not present

## 2016-01-04 DIAGNOSIS — M4317 Spondylolisthesis, lumbosacral region: Secondary | ICD-10-CM | POA: Diagnosis not present

## 2016-01-04 DIAGNOSIS — S29012A Strain of muscle and tendon of back wall of thorax, initial encounter: Secondary | ICD-10-CM | POA: Diagnosis not present

## 2016-01-04 DIAGNOSIS — M5137 Other intervertebral disc degeneration, lumbosacral region: Secondary | ICD-10-CM | POA: Diagnosis not present

## 2016-01-07 DIAGNOSIS — M5137 Other intervertebral disc degeneration, lumbosacral region: Secondary | ICD-10-CM | POA: Diagnosis not present

## 2016-01-07 DIAGNOSIS — M9901 Segmental and somatic dysfunction of cervical region: Secondary | ICD-10-CM | POA: Diagnosis not present

## 2016-01-07 DIAGNOSIS — M9903 Segmental and somatic dysfunction of lumbar region: Secondary | ICD-10-CM | POA: Diagnosis not present

## 2016-01-07 DIAGNOSIS — M9902 Segmental and somatic dysfunction of thoracic region: Secondary | ICD-10-CM | POA: Diagnosis not present

## 2016-01-07 DIAGNOSIS — M4317 Spondylolisthesis, lumbosacral region: Secondary | ICD-10-CM | POA: Diagnosis not present

## 2016-01-07 DIAGNOSIS — S29012A Strain of muscle and tendon of back wall of thorax, initial encounter: Secondary | ICD-10-CM | POA: Diagnosis not present

## 2016-01-07 DIAGNOSIS — R51 Headache: Secondary | ICD-10-CM | POA: Diagnosis not present

## 2016-01-08 ENCOUNTER — Other Ambulatory Visit: Payer: Self-pay | Admitting: Neurology

## 2016-01-09 ENCOUNTER — Other Ambulatory Visit: Payer: Self-pay

## 2016-01-10 ENCOUNTER — Other Ambulatory Visit: Payer: Self-pay | Admitting: Neurology

## 2016-01-10 DIAGNOSIS — M4317 Spondylolisthesis, lumbosacral region: Secondary | ICD-10-CM | POA: Diagnosis not present

## 2016-01-10 DIAGNOSIS — M9903 Segmental and somatic dysfunction of lumbar region: Secondary | ICD-10-CM | POA: Diagnosis not present

## 2016-01-10 DIAGNOSIS — S29012A Strain of muscle and tendon of back wall of thorax, initial encounter: Secondary | ICD-10-CM | POA: Diagnosis not present

## 2016-01-10 DIAGNOSIS — M9902 Segmental and somatic dysfunction of thoracic region: Secondary | ICD-10-CM | POA: Diagnosis not present

## 2016-01-10 DIAGNOSIS — M5137 Other intervertebral disc degeneration, lumbosacral region: Secondary | ICD-10-CM | POA: Diagnosis not present

## 2016-01-10 DIAGNOSIS — R51 Headache: Secondary | ICD-10-CM | POA: Diagnosis not present

## 2016-01-10 DIAGNOSIS — M9901 Segmental and somatic dysfunction of cervical region: Secondary | ICD-10-CM | POA: Diagnosis not present

## 2016-01-10 MED ORDER — VITAMIN D (ERGOCALCIFEROL) 1.25 MG (50000 UNIT) PO CAPS: 50000.0000 [IU] | ORAL_CAPSULE | ORAL | Status: DC

## 2016-01-10 NOTE — Telephone Encounter (Signed)
Would you like to refill, or prefer patient obtain from PCP?  Thank you.

## 2016-01-10 NOTE — Telephone Encounter (Signed)
Juliann Pulse with [facility] on [address] is calling to get a refill for Vitamin D, Ergocalciferol, (DRISDOL) 50000 UNITS CAPS capsule for the patient. Juliann Pulse says a fax was sent a few days ago. Thanks.

## 2016-01-14 ENCOUNTER — Telehealth: Payer: Self-pay | Admitting: Neurology

## 2016-01-14 DIAGNOSIS — G8929 Other chronic pain: Secondary | ICD-10-CM

## 2016-01-14 DIAGNOSIS — R51 Headache: Principal | ICD-10-CM

## 2016-01-14 DIAGNOSIS — R519 Headache, unspecified: Secondary | ICD-10-CM

## 2016-01-14 NOTE — Telephone Encounter (Signed)
Patient called to advise, ketorolac (TORADOL) 30 MG/ML injection is not helping. "I think something is going wrong in my head, headaches constantly, shooting pains up in my head".

## 2016-01-14 NOTE — Telephone Encounter (Signed)
Called pt to discuss her headaches. No answer, left a message asking her to call me back. Perhaps a referral to a headache specialist would be helpful.

## 2016-01-15 DIAGNOSIS — M9902 Segmental and somatic dysfunction of thoracic region: Secondary | ICD-10-CM | POA: Diagnosis not present

## 2016-01-15 DIAGNOSIS — S29012A Strain of muscle and tendon of back wall of thorax, initial encounter: Secondary | ICD-10-CM | POA: Diagnosis not present

## 2016-01-15 DIAGNOSIS — M9903 Segmental and somatic dysfunction of lumbar region: Secondary | ICD-10-CM | POA: Diagnosis not present

## 2016-01-15 DIAGNOSIS — R51 Headache: Secondary | ICD-10-CM | POA: Diagnosis not present

## 2016-01-15 DIAGNOSIS — M4317 Spondylolisthesis, lumbosacral region: Secondary | ICD-10-CM | POA: Diagnosis not present

## 2016-01-15 DIAGNOSIS — M9901 Segmental and somatic dysfunction of cervical region: Secondary | ICD-10-CM | POA: Diagnosis not present

## 2016-01-15 DIAGNOSIS — M5137 Other intervertebral disc degeneration, lumbosacral region: Secondary | ICD-10-CM | POA: Diagnosis not present

## 2016-01-15 NOTE — Telephone Encounter (Signed)
Spoke to pt. She says she has a constant headache that radiates from her right neck up to the back of her head. The toradol shots are not helping. She had a CT neck done on 10/23/2015, and it was negative. She has been seeing a chiropractor 3 times a week. She has also seen an ENT physician, but nothing seems to help.  She is wondering if she needs to have an MRI of her brain to make sure nothing is wrong. I spoke to Dr. Brett Fairy and she recommends a botox evaluation with Dr. [name] and does not want to order an MRI brain at this time. Order placed.  Spoke to pt and advised her that the order was placed to see Dr. [name]. Pt verbalized understanding and appreciation.

## 2016-01-17 ENCOUNTER — Ambulatory Visit (INDEPENDENT_AMBULATORY_CARE_PROVIDER_SITE_OTHER): Payer: Medicare Other | Admitting: Neurology

## 2016-01-17 ENCOUNTER — Telehealth: Payer: Self-pay | Admitting: Neurology

## 2016-01-17 VITALS — BP 140/80 | HR 66 | Ht 67.0 in | Wt 250.8 lb

## 2016-01-17 DIAGNOSIS — M25561 Pain in right knee: Secondary | ICD-10-CM | POA: Diagnosis not present

## 2016-01-17 DIAGNOSIS — G894 Chronic pain syndrome: Secondary | ICD-10-CM | POA: Diagnosis not present

## 2016-01-17 DIAGNOSIS — R51 Headache: Secondary | ICD-10-CM

## 2016-01-17 DIAGNOSIS — M62838 Other muscle spasm: Secondary | ICD-10-CM

## 2016-01-17 DIAGNOSIS — M6248 Contracture of muscle, other site: Secondary | ICD-10-CM | POA: Diagnosis not present

## 2016-01-17 DIAGNOSIS — G43711 Chronic migraine without aura, intractable, with status migrainosus: Secondary | ICD-10-CM

## 2016-01-17 DIAGNOSIS — R519 Headache, unspecified: Secondary | ICD-10-CM

## 2016-01-17 DIAGNOSIS — M25562 Pain in left knee: Secondary | ICD-10-CM | POA: Diagnosis not present

## 2016-01-17 DIAGNOSIS — Z79891 Long term (current) use of opiate analgesic: Secondary | ICD-10-CM | POA: Diagnosis not present

## 2016-01-17 DIAGNOSIS — M545 Low back pain: Secondary | ICD-10-CM | POA: Diagnosis not present

## 2016-01-17 DIAGNOSIS — M542 Cervicalgia: Secondary | ICD-10-CM

## 2016-01-17 DIAGNOSIS — H539 Unspecified visual disturbance: Secondary | ICD-10-CM | POA: Diagnosis not present

## 2016-01-17 DIAGNOSIS — M25511 Pain in right shoulder: Secondary | ICD-10-CM | POA: Diagnosis not present

## 2016-01-17 MED ORDER — TOPIRAMATE 100 MG PO TABS: 100.0000 mg | ORAL_TABLET | Freq: Every day | ORAL | Status: DC

## 2016-01-17 MED ORDER — KETOROLAC TROMETHAMINE 60 MG/2ML IM SOLN
60.0000 mg | Freq: Once | INTRAMUSCULAR | Status: AC
  Administered 2016-01-17: 60 mg via INTRAMUSCULAR

## 2016-01-17 MED ORDER — ROPINIROLE HCL 1 MG PO TABS: ORAL_TABLET | ORAL | Status: DC

## 2016-01-17 NOTE — Telephone Encounter (Signed)
Pt requesting refill on rOPINIRole (REQUIP) 1 MG tablet. Thank you

## 2016-01-17 NOTE — Patient Instructions (Addendum)
Overall you are doing fairly well but I do want to suggest a few things today:   Remember to drink plenty of fluid, eat healthy meals and do not skip any meals. Try to eat protein with a every meal and eat a healthy snack such as fruit or nuts in between meals. Try to keep a regular sleep-wake schedule and try to exercise daily, particularly in the form of walking, 20-30 minutes a day, if you can.   As far as your medications are concerned, I would like to suggest: Topamax at night Botox MRi of the brain Toradol shot  As far as diagnostic testing: MRI of the brain  I would like to see you back of botox , sooner if we need to. Please call us with any interim questions, concerns, problems, updates or refill requests.   Our phone number is [phone]. We also have an after hours call service for urgent matters and there is a physician on-call for urgent questions. For any emergencies you know to call 911 or go to the nearest emergency room

## 2016-01-17 NOTE — Progress Notes (Signed)
60mg /92mL toradol given IM in right deltoid. Cleaned with alcohol wipe prior to injection. Pt tolerated well. Band-aid applied.

## 2016-01-17 NOTE — Progress Notes (Addendum)
[facility]    Provider:  Dr [name] Referring Provider: [name], MD Primary Care Physician:  [name], MD  CC:  headaches  HPI:  [name] is a 70 y.o. female here for evaluation of headaches as a second opinion from Dr. Brett Fairy. She has had neck pain for 7-8 months. Startes in the muscle and radiates up and to the back right of the head. She has had massage and they work hard on it and it helps. She has pain in the back of the head. She has been told it is muscular. Pain on movement of the head. She went to an ENT. Can't figure what it is. She put voltaren gel it helps. Heat and stretching helps. She went to physical therapy. She has decreased ROM to the right.   Headaches worsening the last 6 months. She reports Vision changes with migraines. She has migraines. No aura.  Started when she was a teenager, she has always had them. No FHx. Headaches start on the right. More throbbing pain on the bilateral temples and the back of the right head. The painful neck exacerbates it. She has nausea. She has had vomiting. She takes phenergan. She has to go into a dark room, sounds bothers her. She is having them daily for the last 6 months. Before then better controlled. She has 30 headache days a month, 15 migrainous a month. They can last all day long. No medication overuse. She has pain all over, leg pain, knee pain and she is managed by a pain clinic and uses oxycodone. She was on Topamax in the past. Tried topamax in the past. She has failed amitriptyline. Tried Toradol, tried imitrex, tried cymbalta.   Reviewed outside physician, imaging and lab reports:  Bmp with slightly dec gfr 54, creat 1.04 03/2015  10/2015: CT soft tissue of the neck 10/2015 personally reviewe dimages: Skeleton: Cervical disc and facet degeneration. Mild anterior slip C3-4, C4-5, and C5-6 due to disc and facet degeneration. No fracture or mass lesion.  Upper chest: Lung apices are  clear.  IMPRESSION: Negative for mass or adenopathy in the neck. No acute abnormality  Review of Systems: Patient complains of symptoms per HPI as well as the following symptoms: murmur, swelling in legs, joint pain. Pertinent negatives per HPI. All others negative.   Social History   Social History  . Marital Status: Married    Spouse Name: Pilar Jarvis  . Number of Children: 3  . Years of Education: college   Occupational History  . Retired    Social History Main Topics  . Smoking status: Never Smoker   . Smokeless tobacco: Not on file  . Alcohol Use: No  . Drug Use: No  . Sexual Activity: Yes    Birth Control/ Protection: Post-menopausal   Other Topics Concern  . Not on file   Social History Narrative   Caffeine 8-10 cups daily.  (coffee 1 cup am, unsw tea all day long).     Family History  Problem Relation Age of Onset  . CAD Mother   . Hypertension Mother   . Neuropathy Mother   . Osteoarthritis Mother   . Heart disease Mother   . Breast cancer Maternal Grandmother   . CAD Paternal Grandfather   . Colon cancer Father     Past Medical History  Diagnosis Date  . Sleep apnea     uses CPAP  . H/O hiatal hernia     surgery for hernia  .  Arthritis     generalized  . Leg cramps     takes Flexeril daily as needed  . Restless leg syndrome     takes Requip daily  . Vitamin D deficiency   . Osteoporosis   . Tubular adenoma of colon   . Gestational diabetes   . Diverticulosis     benign  . Dyslipidemia   . Atrophic vaginitis   . DJD (degenerative joint disease)   . Vertigo   . 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 Depression     takes Cymbalta daily  . Insomnia     takes Ambien nightly  . GERD (gastroesophageal reflux disease)     takes Nexium daily  . Dysrhythmia   . Asthma   . Pneumonia 73yrs ago    hx of  . History of bronchitis 1 yr ago  . Headache(784.0)     takes Imitrex daily as needed and Bisoprolol daily;last migraine was about 2wks  ago  . Peripheral neuropathy (HCC)     takes Gabapentin daily  . Joint pain   . Joint swelling   . Back pain     DDD/stenosis  . Family history of GI bleeding   . Nocturia   . 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 History of shingles   . RLS (restless legs syndrome) 08/16/2015  . Carotid stenosis     Carotid US 10/16: Plaque RICA ([id number]), normal LICA    Past Surgical History  Procedure Laterality Date  . Gastric bypass  2001  . Skin reduction  2003 2004    stomach and arm  . Wisdom tooth extraction    . Excess skin removal      post weight loss  . Hernia repair      umbilical  . Colonoscopy    . Esophagogastroduodenoscopy    . Joint replacement Right     knee  . Steroid injection to scar      x 2 in back   . 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 amphetamine-dextroamphetamine (ADDERALL) 10 MG tablet Take 10 mg by mouth daily with breakfast.   0  . bisoprolol (ZEBETA) 10 MG tablet Take 10 mg by mouth 2 (two) times daily.    . calcium carbonate (OS-CAL) 1250 (500 CA) MG chewable tablet Chew 1 tablet by mouth daily. Not sure of dosage    . DULoxetine (CYMBALTA) 30 MG capsule Take 30 mg by mouth 2 (two) times daily.     Marland Kitchen esomeprazole (NEXIUM) 40 MG capsule Take 40 mg by mouth 2 (two) times daily before a meal.   6  . Fexofenadine HCl (ALLEGRA ALLERGY PO) Take 1 tablet by mouth daily as needed (AS NEED FOR ALLERGIES).     Marland Kitchen gabapentin (NEURONTIN) 300 MG capsule Take 1 capsule (300 mg total) by mouth 3 (three) times daily. 90 capsule 5  . ketorolac (TORADOL) 30 MG/ML injection Inject 30mg /97mL into muscle as needed every week. Not to exceed once per week. Please dispense 10 vials. 1 mL 10  . oxyCODONE (OXY IR/ROXICODONE) 5 MG immediate release tablet Take 5  mg by mouth daily as needed for severe pain.     . quiNINE (QUALAQUIN) 324 MG capsule TAKE 1 CAPSULE AFTER LUNCH AND AFTER  DINNER. 60 capsule 6  . rOPINIRole (REQUIP) 1 MG tablet TAKE 1 TABLET(1 MG) BY MOUTH AT BEDTIME AS NEEDED. MAY REPEAT DOSE 1 TIME AS NEEDED 180 tablet 0  . tiZANidine (ZANAFLEX) 4 MG tablet Take 4 mg by mouth 3 (three) times daily.    . Vitamin D, Ergocalciferol, (DRISDOL) 50000 units CAPS capsule Take 1 capsule (50,000 Units total) by mouth every 7 (seven) days. 12 capsule 1  . zolpidem (AMBIEN) 10 MG tablet Take 10 mg by mouth at bedtime.   4   No current facility-administered medications for this visit.    Allergies as of 01/17/2016  . (No Known Allergies)    Vitals: There were no vitals taken for this visit. Last Weight:  Wt Readings from Last 1 Encounters:  12/05/15 245 lb (111.131 kg)   Last Height:   Ht Readings from Last 1 Encounters:  12/05/15 5\' 7"  (1.702 m)    Physical exam: Exam: Gen: NAD, conversant, well nourised, obese, well groomed                     CV: RRR, no MRG. No Carotid Bruits. No peripheral edema, warm, nontender Eyes: Conjunctivae clear without exudates or hemorrhage  Neuro: Detailed Neurologic Exam  Speech:    Speech is normal; fluent and spontaneous with normal comprehension.  Cognition:    The patient is oriented to person, place, and time;     recent and remote memory intact;     language fluent;     normal attention, concentration,     fund of knowledge Cranial Nerves:    The pupils are equal, round, and reactive to light. The fundi are normal. Visual fields are full to finger confrontation. Extraocular movements are intact. Trigeminal sensation is intact and the muscles of mastication are normal. The face is symmetric. The palate elevates in the midline. Hearing intact. Voice is normal. Shoulder shrug is normal. The tongue has normal motion without fasciculations.    Assessment/Plan:  70 year old female with chronic  migraines.She is a good candidate for botox. Will retry Topamax. Discussed botox at length, risks, benefits, expectations.   Restart Topiramate for migraine and promethazine for nausea and vomiting during migraine Toradol shot today in clinic Injections in the cervical spine today Botox for migraines MRI of the brain for worsening headaches and vision changes     NERVE BLOCK PROCEDURE NOTE  Procedure: Patient was consented for right cervical nerve blocks. A solution containing 0.5% 5mg /ml Bupivacaine 1.5-cc, Lidocaine 2% 1.5cc,  and 80mg  Depo Medrol 1cc was prepared in a 5-CC syringe with 30 gauge 1/2 inch needle.   4 Target areas in the right cervical paraspinal regions were identified via palpation and pain response.The sites junctions were sterilized with alcohol wipes. 31ml was injected at each  site. The contents of each syringe was injected in a fanlike fashion. The neck pain improved from 6/10 to 0/10. Patient tolerated the procedure well and no complications were noted.    Consent was provided below and patient acknowledged understanding:   Depo-Medrol 80mg /ml [facility] [id number]  Expiration date: 09/2016 Lot number: [id number]  Bupivicaine 50mg /50ml NDC [id number]  Expiration date: 07/2017 Lot number: [id number]  Lidocaine 2% NDC [id number]  2% Lidocaine- 77mL  Lot: [id number] Expiration: 10/2018 NDC: [id number]   What to expect afterwards?  Immediately after the injection, the back of your head may feel warm and numb. You may also experience reduction in the pain. The local anaesthetic wears off in a  few hours and the steroid usually takes  3-7 days to take effect.   The pain relief is vary variable and can last from a few days to several months. Some patients do not experience any pain relief. Hence it is difficult to predict the outcome of the injection treatment in a particular patient.   There may be some discomfort at the injection site for a couple of days after  treatment, however, this should settle quite quickly. We advise you to take things easy for the rest of the day. Continue taking your pain medication as advised by your consultant or until you feel benefit from the treatment.   What are the side effects / complications?  Common   Soreness / bruising at the injection site.   Temporary increase (up to 7 days) in pain following procedure.   Rare   Bleeding   Infection at the injection site   Allergic reaction   New pain   Worsening pain   Signature on file.   Janice Ill, MD  [facility] [address] [place], [place] [address]  Phone [phone] Fax [phone]  A total of 40 minutes was spent face-to-face with this patient. Over half this time was spent on counseling patient on the migraines diagnosis and different diagnostic and therapeutic options available.

## 2016-01-21 DIAGNOSIS — M9901 Segmental and somatic dysfunction of cervical region: Secondary | ICD-10-CM | POA: Diagnosis not present

## 2016-01-21 DIAGNOSIS — M9902 Segmental and somatic dysfunction of thoracic region: Secondary | ICD-10-CM | POA: Diagnosis not present

## 2016-01-21 DIAGNOSIS — M4317 Spondylolisthesis, lumbosacral region: Secondary | ICD-10-CM | POA: Diagnosis not present

## 2016-01-21 DIAGNOSIS — M5137 Other intervertebral disc degeneration, lumbosacral region: Secondary | ICD-10-CM | POA: Diagnosis not present

## 2016-01-21 DIAGNOSIS — R51 Headache: Secondary | ICD-10-CM | POA: Diagnosis not present

## 2016-01-21 DIAGNOSIS — M9903 Segmental and somatic dysfunction of lumbar region: Secondary | ICD-10-CM | POA: Diagnosis not present

## 2016-01-21 DIAGNOSIS — S29012A Strain of muscle and tendon of back wall of thorax, initial encounter: Secondary | ICD-10-CM | POA: Diagnosis not present

## 2016-01-22 ENCOUNTER — Encounter: Payer: Self-pay | Admitting: Neurology

## 2016-01-22 DIAGNOSIS — G43711 Chronic migraine without aura, intractable, with status migrainosus: Secondary | ICD-10-CM | POA: Insufficient documentation

## 2016-01-24 ENCOUNTER — Ambulatory Visit (INDEPENDENT_AMBULATORY_CARE_PROVIDER_SITE_OTHER): Payer: Medicare Other | Admitting: Neurology

## 2016-01-24 VITALS — BP 135/65 | HR 60 | Ht 67.0 in | Wt 247.0 lb

## 2016-01-24 DIAGNOSIS — M542 Cervicalgia: Secondary | ICD-10-CM | POA: Diagnosis not present

## 2016-01-24 MED ORDER — DICLOFENAC SODIUM 1 % TD GEL: 2.0000 g | Freq: Four times a day (QID) | TRANSDERMAL | Status: DC

## 2016-01-24 NOTE — Progress Notes (Signed)
Bupivacaine: 5mg /ml- 14mL  Lot: [id number] Expiration: 07/15/17 NDC: [id number]  Lidocaine 2%- 84mL Lot: [id number] Expiration: 11/19 NDC: [id number]

## 2016-01-24 NOTE — Addendum Note (Signed)
Addended by: Sarina Ill B on: 01/24/2016 01:46 PM   Modules accepted: Orders

## 2016-01-24 NOTE — Progress Notes (Signed)
NERVE BLOCK PROCEDURE NOTE  Procedure: Patient was consented for right cervical nerve blocks. A solution containing 0.5% 5mg /ml Bupivacaine 1.5-cc, Lidocaine 2% 1.5cc, and 80mg  Depo Medrol 1cc was prepared in a 5-CC syringe with 30 gauge 1/2 inch needle.   4 Target areas in the right cervical paraspinal regions were identified via palpation and pain response.The sites junctions were sterilized with alcohol wipes. 36ml was injected at each site. The contents of each syringe was injected in a fanlike fashion. The neck pain improved from 6/10 to 0/10. Patient tolerated the procedure well and no complications were noted.    Consent was provided below and patient acknowledged understanding:   Depo-Medrol 80mg /ml NDC [id number]  Expiration date: 09/2016 Lot number: [id number]  Bupivicaine 50mg /56ml NDC [id number]  Expiration date: 07/2017 Lot number: [id number]  Lidocaine 2% NDC [id number]  2% Lidocaine- 40mL  Lot: [id number] Expiration: 10/2018 NDC: [id number]   What to expect afterwards?  Immediately after the injection, the back of your head may feel warm and numb. You may also experience reduction in the pain. The local anaesthetic wears off in a few hours and the steroid usually takes  3-7 days to take effect.   The pain relief is vary variable and can last from a few days to several months. Some patients do not experience any pain relief. Hence it is difficult to predict the outcome of the injection treatment in a particular patient.   There may be some discomfort at the injection site for a couple of days after treatment, however, this should settle quite quickly. We advise you to take things easy for the rest of the day. Continue taking your pain medication as advised by your consultant or until you feel benefit from the treatment.   What are the side effects / complications?  Common   Soreness / bruising at the injection site.   Temporary increase (up to 7 days) in pain  following procedure.   Rare   Bleeding   Infection at the injection site   Allergic reaction   New pain   Worsening pain   Signature on file.   Sarina Ill, MD  [facility] [address] [place], [place] [address]  Phone [phone] Fax [phone]

## 2016-01-30 DIAGNOSIS — M9903 Segmental and somatic dysfunction of lumbar region: Secondary | ICD-10-CM | POA: Diagnosis not present

## 2016-01-30 DIAGNOSIS — M5137 Other intervertebral disc degeneration, lumbosacral region: Secondary | ICD-10-CM | POA: Diagnosis not present

## 2016-01-30 DIAGNOSIS — M4317 Spondylolisthesis, lumbosacral region: Secondary | ICD-10-CM | POA: Diagnosis not present

## 2016-01-30 DIAGNOSIS — S29012A Strain of muscle and tendon of back wall of thorax, initial encounter: Secondary | ICD-10-CM | POA: Diagnosis not present

## 2016-01-30 DIAGNOSIS — R51 Headache: Secondary | ICD-10-CM | POA: Diagnosis not present

## 2016-01-30 DIAGNOSIS — M9902 Segmental and somatic dysfunction of thoracic region: Secondary | ICD-10-CM | POA: Diagnosis not present

## 2016-01-30 DIAGNOSIS — M9901 Segmental and somatic dysfunction of cervical region: Secondary | ICD-10-CM | POA: Diagnosis not present

## 2016-01-31 ENCOUNTER — Ambulatory Visit (INDEPENDENT_AMBULATORY_CARE_PROVIDER_SITE_OTHER): Payer: Medicare Other | Admitting: Neurology

## 2016-01-31 VITALS — BP 122/70 | HR 62 | Wt 244.0 lb

## 2016-01-31 DIAGNOSIS — G243 Spasmodic torticollis: Secondary | ICD-10-CM

## 2016-01-31 DIAGNOSIS — M5481 Occipital neuralgia: Secondary | ICD-10-CM

## 2016-01-31 MED ORDER — BACLOFEN 10 MG PO TABS: 10.0000 mg | ORAL_TABLET | Freq: Three times a day (TID) | ORAL | Status: DC

## 2016-01-31 MED ORDER — PROMETHAZINE HCL 25 MG PO TABS: 25.0000 mg | ORAL_TABLET | Freq: Four times a day (QID) | ORAL | Status: DC | PRN

## 2016-01-31 NOTE — Progress Notes (Signed)
NERVE BLOCK PROCEDURE NOTE  Procedure: Patient was consented for right cervical and occipital nerve blocks. A solution containing 0.5% 5mg /ml Bupivacaine, Lidocaine 2%  was prepared in a 5-CC syringe with 30 gauge 1/2 inch needle.   8 Target areas in the right cervical paraspinal regions and occipital region were identified via palpation and pain response.The sites junctions were sterilized with alcohol wipes. 77ml was injected at each site. The contents of each syringe was injected in a fanlike fashion. The neck pain improved from 6/10 to 0/10. Patient tolerated the procedure well and no complications were noted.    Consent was provided below and patient acknowledged understanding:   Bupivicaine 50mg /68ml NDC [id number]  Expiration date: 07/2017 Lot number: [id number]  Lidocaine 2% NDC [id number]  2% Lidocaine- 101mL  Lot: [id number] Expiration: 10/2018 NDC: [id number]   What to expect afterwards?  Immediately after the injection, the back of your head may feel warm and numb. You may also experience reduction in the pain. The local anaesthetic wears off in a few hours and the steroid usually takes  3-7 days to take effect.   The pain relief is vary variable and can last from a few days to several months. Some patients do not experience any pain relief. Hence it is difficult to predict the outcome of the injection treatment in a particular patient.   There may be some discomfort at the injection site for a couple of days after treatment, however, this should settle quite quickly. We advise you to take things easy for the rest of the day. Continue taking your pain medication as advised by your consultant or until you feel benefit from the treatment.   What are the side effects / complications?  Common   Soreness / bruising at the injection site.   Temporary increase (up to 7 days) in pain following procedure.   Rare   Bleeding   Infection at the injection site   Allergic  reaction   New pain   Worsening pain   Signature on file.   Sarina Ill, MD  [facility] [address] [place], [place] [address]

## 2016-02-06 DIAGNOSIS — M9902 Segmental and somatic dysfunction of thoracic region: Secondary | ICD-10-CM | POA: Diagnosis not present

## 2016-02-06 DIAGNOSIS — M4317 Spondylolisthesis, lumbosacral region: Secondary | ICD-10-CM | POA: Diagnosis not present

## 2016-02-06 DIAGNOSIS — S29012A Strain of muscle and tendon of back wall of thorax, initial encounter: Secondary | ICD-10-CM | POA: Diagnosis not present

## 2016-02-06 DIAGNOSIS — M9901 Segmental and somatic dysfunction of cervical region: Secondary | ICD-10-CM | POA: Diagnosis not present

## 2016-02-06 DIAGNOSIS — M9903 Segmental and somatic dysfunction of lumbar region: Secondary | ICD-10-CM | POA: Diagnosis not present

## 2016-02-06 DIAGNOSIS — M5137 Other intervertebral disc degeneration, lumbosacral region: Secondary | ICD-10-CM | POA: Diagnosis not present

## 2016-02-06 DIAGNOSIS — R51 Headache: Secondary | ICD-10-CM | POA: Diagnosis not present

## 2016-02-10 ENCOUNTER — Encounter: Payer: Self-pay | Admitting: Neurology

## 2016-02-11 ENCOUNTER — Telehealth: Payer: Self-pay | Admitting: Neurology

## 2016-02-11 ENCOUNTER — Ambulatory Visit (INDEPENDENT_AMBULATORY_CARE_PROVIDER_SITE_OTHER): Payer: Medicare Other | Admitting: Neurology

## 2016-02-11 VITALS — BP 164/92 | HR 60 | Ht 67.0 in | Wt 238.0 lb

## 2016-02-11 DIAGNOSIS — G43711 Chronic migraine without aura, intractable, with status migrainosus: Secondary | ICD-10-CM | POA: Diagnosis not present

## 2016-02-11 MED ORDER — PREGABALIN 50 MG PO CAPS: ORAL_CAPSULE | ORAL | Status: DC

## 2016-02-11 MED ORDER — TOPIRAMATE 100 MG PO TABS: 200.0000 mg | ORAL_TABLET | Freq: Every day | ORAL | Status: DC

## 2016-02-11 NOTE — Patient Instructions (Addendum)
Remember to drink plenty of fluid, eat healthy meals and do not skip any meals. Try to eat protein with a every meal and eat a healthy snack such as fruit or nuts in between meals. Try to keep a regular sleep-wake schedule and try to exercise daily, particularly in the form of walking, 20-30 minutes a day, if you can.   As far as your medications are concerned, I would like to suggest: Increase Topamax to 1.5 pills at night and in two weeks go up to 2 pills at night Lyrica start with 50mg  twice daily then increase to 100mg  twice daily slowly.  I would like to see you back in 3 months, sooner if we need to. Please call us with any interim questions, concerns, problems, updates or refill requests.   Our phone number is [phone]. We also have an after hours call service for urgent matters and there is a physician on-call for urgent questions. For any emergencies you know to call 911 or go to the nearest emergency room

## 2016-02-11 NOTE — Progress Notes (Signed)
Consent Form: Discussed following with patient Botulism Toxin Injection For Chronic Migraine  Botulism toxin has been approved by the Federal drug administration for treatment of chronic migraine. Botulism toxin does not cure chronic migraine and it may not be effective in some patients.  The administration of botulism toxin is accomplished by injecting a small amount of toxin into the muscles of the neck and head. Dosage must be titrated for each individual. Any benefits resulting from botulism toxin tend to wear off after 3 months with a repeat injection required if benefit is to be maintained. Injections are usually done every 3-4 months with maximum effect peak achieved by about 2 or 3 weeks. Botulism toxin is expensive and you should be sure of what costs you will incur resulting from the injection.  The side effects of botulism toxin use for chronic migraine may include:   -Transient, and usually mild, facial weakness with facial injections  -Transient, and usually mild, head or neck weakness with head/neck injections  -Reduction or loss of forehead facial animation due to forehead muscle weakness  -Eyelid drooping  -Dry eye  -Pain at the site of injection or bruising at the site of injection  -Double vision  -Potential unknown long term risks  Contraindications: You should not have Botox if you are pregnant, nursing, allergic to albumin, have an infection, skin condition, or muscle weakness at the site of the injection, or have myasthenia gravis, Lambert-Eaton syndrome, or ALS.  It is also possible that as with any injection, there may be an allergic reaction or no effect from the medication. Reduced effectiveness after repeated injections is sometimes seen and rarely infection at the injection site may occur. All care will be taken to prevent these side effects. If therapy is given over a long time, atrophy and wasting in the muscle injected may occur. Occasionally the patient's  become refractory to treatment because they develop antibodies to the toxin. In this event, therapy needs to be modified.  I have read the above information and consent to the administration of botulism toxin.    BOTOX PROCEDURE NOTE FOR MIGRAINE HEADACHE  Contraindications and precautions discussed with patient(above). Aseptic procedure was observed and patient tolerated procedure. Procedure performed by Dr. Georgia Dom  The condition has existed for more than 6 months, and pt does not have a diagnosis of ALS, Myasthenia Gravis or Lambert-Eaton Syndrome. Risks and benefits of injections discussed and pt agrees to proceed with the procedure. Written consent obtained  These injections are medically necessary. These injections do not cause sedations or hallucinations which the oral therapies may cause.  Indication/Diagnosis: chronic migraine BOTOX(J0585) injection was performed according to protocol by [employer]. 200 units of BOTOX was dissolved into 4 cc NS.  NDC: [id number]  Type of toxin: Botox  Botox: 200 units  Lot: [id number] Exp: 08/2018 [id number]  Lot: [id number] Exp: 07/2018 [id number]   0.9% sodium chloride- 44mL  Lot: [id number]  Expiration: 10/2017  NDC: [id number]    Description of procedure:  The patient was placed in a sitting position. The standard protocol was used for Botox as follows, with 5 units of Botox injected at each site:   -Procerus muscle, midline injection  -Corrugator muscle, bilateral injection  -Frontalis muscle, bilateral injection, with 2 sites each side, medial injection was performed in the upper one third of the frontalis muscle, in the region vertical from the medial inferior edge of the superior orbital rim. The lateral injection was again in the upper one third of  the forehead vertically above the lateral limbus of the cornea, 1.5 cm lateral to the medial injection site.  -Temporalis muscle injection, 4 sites, bilaterally. The first  injection was 3 cm above the tragus of the ear, second injection site was 1.5 cm to 3 cm up from the first injection site in line with the tragus of the ear. The third injection site was 1.5-3 cm forward between the first 2 injection sites. The fourth injection site was 1.5 cm posterior to the second injection site.  -Occipitalis muscle injection, 3 sites, bilaterally. The first injection was done one half way between the occipital protuberance and the tip of the mastoid process behind the ear. The second injection site was done lateral and superior to the first, 1 fingerbreadth from the first injection. The third injection site was 1 fingerbreadth superiorly and medially from the first injection site.  -Cervical paraspinal muscle injection, 2 sites, bilateral knee first injection site was 1 cm from the midline of the cervical spine, 3 cm inferior to the lower border of the occipital protuberance. The second injection site was 1.5 cm superiorly and laterally to the first injection site.  -Trapezius muscle injection was performed at 3 sites, bilaterally. The first injection site was in the upper trapezius muscle halfway between the inflection point of the neck, and the acromion. The second injection site was one half way between the acromion and the first injection site. The third injection was done between the first injection site and the inflection point of the neck.   Will return for repeat injection in 3 months.   200 units of Botox was used, 155 units were injected, the rest of the Botox was injected into the bilateral cervical muscles based on palpation and pain response. The patient tolerated the procedure well, there were no complications of the above procedure.

## 2016-02-11 NOTE — Progress Notes (Signed)
Botox: 200 units Lot: [id number] Exp: 08/2018 [id number] Lot: [id number] Exp: 07/2018 [id number]  0.9% sodium chloride- 81mL Lot: [id number] Expiration: 10/2017 NDC: [id number]

## 2016-02-13 NOTE — Telephone Encounter (Signed)
error 

## 2016-02-14 ENCOUNTER — Inpatient Hospital Stay: Admission: RE | Admit: 2016-02-14 | Payer: Medicare Other | Source: Ambulatory Visit

## 2016-02-14 DIAGNOSIS — M25562 Pain in left knee: Secondary | ICD-10-CM | POA: Diagnosis not present

## 2016-02-14 DIAGNOSIS — M542 Cervicalgia: Secondary | ICD-10-CM | POA: Diagnosis not present

## 2016-02-14 DIAGNOSIS — M25561 Pain in right knee: Secondary | ICD-10-CM | POA: Diagnosis not present

## 2016-02-14 DIAGNOSIS — M545 Low back pain: Secondary | ICD-10-CM | POA: Diagnosis not present

## 2016-02-14 DIAGNOSIS — G894 Chronic pain syndrome: Secondary | ICD-10-CM | POA: Diagnosis not present

## 2016-02-14 DIAGNOSIS — M25511 Pain in right shoulder: Secondary | ICD-10-CM | POA: Diagnosis not present

## 2016-02-14 DIAGNOSIS — Z79891 Long term (current) use of opiate analgesic: Secondary | ICD-10-CM | POA: Diagnosis not present

## 2016-02-15 DIAGNOSIS — M9902 Segmental and somatic dysfunction of thoracic region: Secondary | ICD-10-CM | POA: Diagnosis not present

## 2016-02-15 DIAGNOSIS — M4317 Spondylolisthesis, lumbosacral region: Secondary | ICD-10-CM | POA: Diagnosis not present

## 2016-02-15 DIAGNOSIS — M9901 Segmental and somatic dysfunction of cervical region: Secondary | ICD-10-CM | POA: Diagnosis not present

## 2016-02-15 DIAGNOSIS — M5137 Other intervertebral disc degeneration, lumbosacral region: Secondary | ICD-10-CM | POA: Diagnosis not present

## 2016-02-15 DIAGNOSIS — R51 Headache: Secondary | ICD-10-CM | POA: Diagnosis not present

## 2016-02-15 DIAGNOSIS — M9903 Segmental and somatic dysfunction of lumbar region: Secondary | ICD-10-CM | POA: Diagnosis not present

## 2016-02-15 DIAGNOSIS — S29012A Strain of muscle and tendon of back wall of thorax, initial encounter: Secondary | ICD-10-CM | POA: Diagnosis not present

## 2016-02-18 ENCOUNTER — Other Ambulatory Visit: Payer: Self-pay | Admitting: Neurology

## 2016-02-18 ENCOUNTER — Telehealth: Payer: Self-pay | Admitting: *Deleted

## 2016-02-18 DIAGNOSIS — R1115 Cyclical vomiting syndrome unrelated to migraine: Secondary | ICD-10-CM

## 2016-02-18 DIAGNOSIS — G43001 Migraine without aura, not intractable, with status migrainosus: Secondary | ICD-10-CM

## 2016-02-18 MED ORDER — ONDANSETRON 4 MG PO TBDP: 4.0000 mg | ORAL_TABLET | Freq: Three times a day (TID) | ORAL | Status: DC | PRN

## 2016-02-18 NOTE — Telephone Encounter (Signed)
LVM for pt to call. Please let her know insurance would not cover promethazine. Dr [name] called in some zofran to her pharmacy to see if this is covered instead. Thank you. Gave [facility] phone number.

## 2016-02-19 NOTE — Telephone Encounter (Signed)
Pt returned call and was told of message below. She expressed understanding

## 2016-02-22 DIAGNOSIS — M5137 Other intervertebral disc degeneration, lumbosacral region: Secondary | ICD-10-CM | POA: Diagnosis not present

## 2016-02-22 DIAGNOSIS — S29012A Strain of muscle and tendon of back wall of thorax, initial encounter: Secondary | ICD-10-CM | POA: Diagnosis not present

## 2016-02-22 DIAGNOSIS — M9902 Segmental and somatic dysfunction of thoracic region: Secondary | ICD-10-CM | POA: Diagnosis not present

## 2016-02-22 DIAGNOSIS — M9901 Segmental and somatic dysfunction of cervical region: Secondary | ICD-10-CM | POA: Diagnosis not present

## 2016-02-22 DIAGNOSIS — M9903 Segmental and somatic dysfunction of lumbar region: Secondary | ICD-10-CM | POA: Diagnosis not present

## 2016-02-22 DIAGNOSIS — R51 Headache: Secondary | ICD-10-CM | POA: Diagnosis not present

## 2016-02-22 DIAGNOSIS — M4317 Spondylolisthesis, lumbosacral region: Secondary | ICD-10-CM | POA: Diagnosis not present

## 2016-02-24 ENCOUNTER — Ambulatory Visit
Admission: RE | Admit: 2016-02-24 | Discharge: 2016-02-24 | Disposition: A | Discharge status: Home / Self Care | Payer: Medicare Other | Source: Ambulatory Visit | Attending: Neurology | Admitting: Neurology

## 2016-02-24 DIAGNOSIS — R519 Headache, unspecified: Secondary | ICD-10-CM

## 2016-02-24 DIAGNOSIS — H539 Unspecified visual disturbance: Secondary | ICD-10-CM | POA: Diagnosis not present

## 2016-02-24 DIAGNOSIS — R51 Headache: Principal | ICD-10-CM

## 2016-02-24 MED ORDER — GADOBENATE DIMEGLUMINE 529 MG/ML IV SOLN
20.0000 mL | Freq: Once | INTRAVENOUS | Status: AC | PRN
  Administered 2016-02-24: 20 mL via INTRAVENOUS

## 2016-02-25 ENCOUNTER — Telehealth: Payer: Self-pay | Admitting: Neurology

## 2016-02-25 ENCOUNTER — Other Ambulatory Visit: Payer: Self-pay | Admitting: Neurology

## 2016-02-25 DIAGNOSIS — I6389 Other cerebral infarction: Secondary | ICD-10-CM

## 2016-02-25 NOTE — Telephone Encounter (Signed)
Spoke to patient regarding mri below. 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 Advised she should probably take something for stroke prevention, unsure why she can't take asa will have to look into this. Will discuss with our stroke physician Dr. Erlinda Hong. She is following up Thursday to discuss.   IMPRESSION: This MRI of the brain with and without contrast shows the following: 1. Two small wedge-shaped foci in the left hemisphere consistent with prior small ischemic strokes. As both involve the gray matter and juxtacortical white matter, consider an embolic etiology 2. Chronic microvascular ischemic change elsewhere, typical extent for age. 3. Cortical atrophy, more than expected for age. 4. There is a normal enhancement pattern and there are no acute findings.

## 2016-02-26 NOTE — Telephone Encounter (Signed)
Yes. There are two cortical T2 hyperintensities. Not sure if she has any previous head trauma at left frontal area. If no trauma, it could potentially be cortical small silent old infarcts. She had carotid doppler 6 months ago unremarkable, does not sounds like artery to artery emboli. I saw you are checking LDL and A1C. If LDL > 70, she should be on statin. In terms of antiplatelet, she should be on either ASA or plavix. You can do MRA head to rule out intracranial stenosis, but that would not change management as she can not take dual antiplatelet.   So, my recommendation: 1. Ask for brain trauma history 2. Follow up with LDL and A1C, if LDL > 70, consider statin 3. Either ASA or plavix for stroke prevention. 4. Consider outpt 30 day cardiac event monitoring to rule out afib 5. If able, can get MRA head or regular TCD to evaluate intracranial stenosis but may not change management. 6. If there is any risk of DVT, you can do check LE venous doppler and TCD bubble study. But sounds like low yield to me.   Thank you for discussing with me. Let me know if I can be of further help.  Miraya Cudney

## 2016-02-27 ENCOUNTER — Other Ambulatory Visit (INDEPENDENT_AMBULATORY_CARE_PROVIDER_SITE_OTHER): Payer: Self-pay

## 2016-02-27 DIAGNOSIS — Z0289 Encounter for other administrative examinations: Secondary | ICD-10-CM

## 2016-02-27 DIAGNOSIS — I638 Other cerebral infarction: Secondary | ICD-10-CM | POA: Diagnosis not present

## 2016-02-27 DIAGNOSIS — Z79899 Other long term (current) drug therapy: Secondary | ICD-10-CM | POA: Diagnosis not present

## 2016-02-27 DIAGNOSIS — I6389 Other cerebral infarction: Secondary | ICD-10-CM

## 2016-02-28 ENCOUNTER — Encounter: Payer: Self-pay | Admitting: Neurology

## 2016-02-28 ENCOUNTER — Ambulatory Visit (INDEPENDENT_AMBULATORY_CARE_PROVIDER_SITE_OTHER): Payer: Medicare Other | Admitting: Neurology

## 2016-02-28 VITALS — BP 128/77 | HR 74 | Ht 67.0 in | Wt 234.0 lb

## 2016-02-28 DIAGNOSIS — I63412 Cerebral infarction due to embolism of left middle cerebral artery: Secondary | ICD-10-CM | POA: Diagnosis not present

## 2016-02-28 LAB — LIPID PANEL
CHOL/HDL RATIO: 4.6 ratio — AB (ref 0.0–4.4)
Cholesterol, Total: 223 mg/dL — ABNORMAL HIGH (ref 100–199)
HDL: 48 mg/dL (ref >39)
LDL Calculated: 146 mg/dL — ABNORMAL HIGH (ref 0–99)
TRIGLYCERIDES: 143 mg/dL (ref 0–149)
VLDL Cholesterol Cal: 29 mg/dL (ref 5–40)

## 2016-02-28 LAB — HEMOGLOBIN A1C
Est. average glucose Bld gHb Est-mCnc: 114 mg/dL
HEMOGLOBIN A1C: 5.6 % (ref 4.8–5.6)

## 2016-02-28 MED ORDER — PREGABALIN 100 MG PO CAPS: 100.0000 mg | ORAL_CAPSULE | Freq: Two times a day (BID) | ORAL | Status: DC

## 2016-02-28 MED ORDER — EZETIMIBE 10 MG PO TABS: 10.0000 mg | ORAL_TABLET | Freq: Every day | ORAL | Status: DC

## 2016-02-28 NOTE — Progress Notes (Signed)
[facility]    Provider:  Dr [name] Referring Provider: [name], MD Primary Care Physician:  [name], MD  CC: headaches  Interval history 02/28/2016; Patient returns to discuss MRI of the brain with possible embolic strokes. Discussed with patient and husband. Reviewed all images with patient and husband, explained image results. Discussed strokes, types, evaluation. Will perform a stroke workup including carotid dopplers, 30-day heart monitor, echo, mra of the brain.Started daily asa 81mg  and zetia for HLD (cannot take statins). May need a loop recorder.    IMPRESSION: This MRI of the brain with and without contrast shows the following: 1. Two small wedge-shaped foci in the left hemisphere consistent with prior small ischemic strokes. As both involve the gray matter and juxtacortical white matter, consider an embolic etiology 2. Chronic microvascular ischemic change elsewhere, typical extent for age. 3. Cortical atrophy, more than expected for age. 4. There is a normal enhancement pattern and there are no acute findings.  HPI: [name] is a 70 y.o. female here for evaluation of headaches as a second opinion from Dr. Brett Fairy. She has had neck pain for 7-8 months. Startes in the muscle and radiates up and to the back right of the head. She has had massage and they work hard on it and it helps. She has pain in the back of the head. She has been told it is muscular. Pain on movement of the head. She went to an ENT. Can't figure what it is. She put voltaren gel it helps. Heat and stretching helps. She went to physical therapy. She has decreased ROM to the right.   Headaches worsening the last 6 months. She reports Vision changes with migraines. She has migraines. No aura. Started when she was a teenager, she has always had them. No FHx. Headaches start on the right. More throbbing pain on the bilateral temples and the back of the right head. The painful  neck exacerbates it. She has nausea. She has had vomiting. She takes phenergan. She has to go into a dark room, sounds bothers her. She is having them daily for the last 6 months. Before then better controlled. She has 30 headache days a month, 15 migrainous a month. They can last all day long. No medication overuse. She has pain all over, leg pain, knee pain and she is managed by a pain clinic and uses oxycodone. She was on Topamax in the past. Tried topamax in the past. She has failed amitriptyline. Tried Toradol, tried imitrex, tried cymbalta.   Reviewed outside physician, imaging and lab reports:  Bmp with slightly dec gfr 54, creat 1.04 03/2015  10/2015: CT soft tissue of the neck 10/2015 personally reviewe dimages: Skeleton: Cervical disc and facet degeneration. Mild anterior slip C3-4, C4-5, and C5-6 due to disc and facet degeneration. No fracture or mass lesion.  Upper chest: Lung apices are clear.  IMPRESSION: Negative for mass or adenopathy in the neck. No acute abnormality  Review of Systems: Patient complains of symptoms per HPI as well as the following symptoms: murmur, swelling in legs, joint pain. Pertinent negatives per HPI. All others negative.    Social History   Social History  . Marital Status: Married    Spouse Name: Pilar Jarvis  . Number of Children: 3  . Years of Education: college   Occupational History  . Retired    Social History Main Topics  . Smoking status: Never Smoker   . Smokeless tobacco: Not on file  .  Alcohol Use: No  . Drug Use: No  . Sexual Activity: Yes    Birth Control/ Protection: Post-menopausal   Other Topics Concern  . Not on file   Social History Narrative   Caffeine 8-10 cups daily.  (coffee 1 cup am, unsw tea all day long).     Family History  Problem Relation Age of Onset  . CAD Mother   . Hypertension Mother   . Neuropathy Mother   . Osteoarthritis Mother   . Heart disease Mother   . Breast cancer Maternal  Grandmother   . CAD Paternal Grandfather   . Colon cancer Father     Past Medical History  Diagnosis Date  . Sleep apnea     uses CPAP  . H/O hiatal hernia     surgery for hernia  . Arthritis     generalized  . Leg cramps     takes Flexeril daily as needed  . Restless leg syndrome     takes Requip daily  . Vitamin D deficiency   . Osteoporosis   . Tubular adenoma of colon   . Gestational diabetes   . Diverticulosis     benign  . Dyslipidemia   . Atrophic vaginitis   . DJD (degenerative joint disease)   . Vertigo   . 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 Depression     takes Cymbalta daily  . Insomnia     takes Ambien nightly  . GERD (gastroesophageal reflux disease)     takes Nexium daily  . Dysrhythmia   . Asthma   . Pneumonia 70yrs ago    hx of  . History of bronchitis 1 yr ago  . Headache(784.0)     takes Imitrex daily as needed and Bisoprolol daily;last migraine was about 2wks ago  . Peripheral neuropathy (HCC)     takes Gabapentin daily  . Joint pain   . Joint swelling   . Back pain     DDD/stenosis  . Family history of GI bleeding   . Nocturia   . 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 History of shingles   . RLS (restless legs syndrome) 08/16/2015  . Carotid stenosis     Carotid US 10/16: Plaque RICA ([id number]), normal LICA    Past Surgical History  Procedure Laterality Date  . Gastric bypass  2001  . Skin reduction  2003 2004    stomach and arm  . Wisdom tooth extraction    . Excess skin removal      post weight loss  . Hernia repair      umbilical  . Colonoscopy    . Esophagogastroduodenoscopy    . Joint replacement Right     knee  . Steroid injection to scar      x 2 in back   . 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  amphetamine-dextroamphetamine (ADDERALL) 10 MG tablet Take 10 mg by mouth daily with breakfast.   0  . baclofen (LIORESAL) 10 MG tablet Take 1 tablet (10 mg total) by mouth 3 (three) times daily. 90 each 12  . bisoprolol (ZEBETA) 10 MG tablet Take 10 mg by mouth 2 (two) times daily.    . calcium carbonate (OS-CAL) 1250 (500 CA) MG chewable tablet  Chew 1 tablet by mouth daily. Not sure of dosage    . diclofenac sodium (VOLTAREN) 1 % GEL Apply 2 g topically 4 (four) times daily. 100 g 12  . DULoxetine (CYMBALTA) 30 MG capsule Take 30 mg by mouth 2 (two) times daily.     Marland Kitchen esomeprazole (NEXIUM) 40 MG capsule Take 40 mg by mouth 2 (two) times daily before a meal.   6  . Fexofenadine HCl (ALLEGRA ALLERGY PO) Take 1 tablet by mouth daily as needed (AS NEED FOR ALLERGIES).     Marland Kitchen ketorolac (TORADOL) 30 MG/ML injection Inject 30mg /39mL into muscle as needed every week. Not to exceed once per week. Please dispense 10 vials. 1 mL 10  . ondansetron (ZOFRAN ODT) 4 MG disintegrating tablet Take 1 tablet (4 mg total) by mouth every 8 (eight) hours as needed for nausea or vomiting. 20 tablet 0  . oxyCODONE (OXY IR/ROXICODONE) 5 MG immediate release tablet Take 5 mg by mouth daily as needed for severe pain.     Marland Kitchen PROAIR HFA 108 (90 Base) MCG/ACT inhaler INL 2 PFS PO Q 4 H PRN  5  . quiNINE (QUALAQUIN) 324 MG capsule TAKE 1 CAPSULE AFTER LUNCH AND AFTER DINNER. 60 capsule 6  . rOPINIRole (REQUIP) 1 MG tablet TAKE 1 TABLET(1 MG) BY MOUTH AT BEDTIME AS NEEDED. MAY REPEAT DOSE 1 TIME AS NEEDED 180 tablet 0  . topiramate (TOPAMAX) 100 MG tablet Take 2 tablets (200 mg total) by mouth at bedtime. 60 tablet 11  . Vitamin D, Ergocalciferol, (DRISDOL) 50000 units CAPS capsule Take 1 capsule (50,000 Units total) by mouth every 7 (seven) days. 12 capsule 1  . zolpidem (AMBIEN) 10 MG tablet Take 10 mg by mouth at bedtime.   4  . ezetimibe (ZETIA) 10 MG tablet Take 1 tablet (10 mg total) by mouth daily. 30 tablet 11  . pregabalin  (LYRICA) 100 MG capsule Take 1 capsule (100 mg total) by mouth 2 (two) times daily. 60 capsule 5   No current facility-administered medications for this visit.    Allergies as of 02/28/2016  . (No Known Allergies)    Vitals: BP 128/77 mmHg  Pulse 74  Ht 5\' 7"  (1.702 m)  Wt 234 lb (106.142 kg)  BMI 36.64 kg/m2 Last Weight:  Wt Readings from Last 1 Encounters:  02/28/16 234 lb (106.142 kg)   Last Height:   Ht Readings from Last 1 Encounters:  02/28/16 5\' 7"  (1.702 m)       Physical exam: Exam: Gen: NAD, conversant, well nourised, obese, well groomed  CV: RRR, no MRG. No Carotid Bruits. No peripheral edema, warm, nontender Eyes: Conjunctivae clear without exudates or hemorrhage  Neuro: Detailed Neurologic Exam  Speech:  Speech is normal; fluent and spontaneous with normal comprehension.  Cognition:  The patient is oriented to person, place, and time;   recent and remote memory intact;   language fluent;   normal attention, concentration,   fund of knowledge Cranial Nerves:  The pupils are equal, round, and reactive to light. The fundi are normal. Visual fields are full to finger confrontation. Extraocular movements are intact. Trigeminal sensation is intact and the muscles of mastication are normal. The face is symmetric. The palate elevates in the midline. Hearing intact. Voice is normal. Shoulder shrug is normal. The tongue has normal motion without fasciculations.    Assessment/Plan: 70 year old female with chronic migraines, embolic looking strokes on MRI of the brain  Restart Topiramate for migraine and  promethazine for nausea and vomiting during migraine  Will perform a stroke workup including carotid dopplers, 30-day heart monitor, echo, mra of the brain.Started daily asa 81mg  and zetia for HLD (cannot take statins). May need a loop recorder.    IMPRESSION: This MRI of the brain with and without contrast shows the  following: 1. Two small wedge-shaped foci in the left hemisphere consistent with prior small ischemic strokes. As both involve the gray matter and juxtacortical white matter, consider an embolic etiology 2. Chronic microvascular ischemic change elsewhere, typical extent for age. 3. Cortical atrophy, more than expected for age. 4. There is a normal enhancement pattern and there are no acute findings.  I had a long d/w patient about her recent stroke, risk for recurrent stroke/TIAs, personally independently reviewed imaging studies and stroke evaluation results and answered questions.Start ASA 81 for secondary stroke prevention and maintain strict control of hypertension with blood pressure goal below 130/90, diabetes with hemoglobin A1c goal below 6.5% and lipids with LDL cholesterol goal below 70 mg/dL.Patient has h/o statin intolerance hence recommend Zetia. I also advised the patient to eat a healthy diet with plenty of whole grains, cereals, fruits and vegetables, exercise regularly and maintain ideal body weight.   Sarina Ill, MD  [facility] [address] [place], [place] [address]  Phone [phone] Fax [phone]  A total of 45 minutes was spent face-to-face with this patient. Over half this time was spent on counseling patient on the stroke  diagnosis and different diagnostic and therapeutic options available.

## 2016-02-28 NOTE — Patient Instructions (Addendum)
Remember to drink plenty of fluid, eat healthy meals and do not skip any meals. Try to eat protein with a every meal and eat a healthy snack such as fruit or nuts in between meals. Try to keep a regular sleep-wake schedule and try to exercise daily, particularly in the form of walking, 20-30 minutes a day, if you can.   As far as diagnostic testing: Echocardiogram heart, 30-day heart monitor, daily chewable baby aspirin (not enteric coated), zetia daily  I would like to see you back in 3 months, sooner if we need to. Please call us with any interim questions, concerns, problems, updates or refill requests.   Please also call us for any test results so we can go over those with you on the phone.  My clinical assistant and will answer any of your questions and relay your messages to me and also relay most of my messages to you.   Our phone number is [phone]. We also have an after hours call service for urgent matters and there is a physician on-call for urgent questions. For any emergencies you know to call 911 or go to the nearest emergency room

## 2016-02-29 DIAGNOSIS — N390 Urinary tract infection, site not specified: Secondary | ICD-10-CM | POA: Diagnosis not present

## 2016-03-05 ENCOUNTER — Telehealth: Payer: Self-pay | Admitting: Neurology

## 2016-03-05 DIAGNOSIS — I63412 Cerebral infarction due to embolism of left middle cerebral artery: Secondary | ICD-10-CM

## 2016-03-05 DIAGNOSIS — I4891 Unspecified atrial fibrillation: Secondary | ICD-10-CM

## 2016-03-05 DIAGNOSIS — I639 Cerebral infarction, unspecified: Secondary | ICD-10-CM

## 2016-03-05 NOTE — Telephone Encounter (Signed)
[name], would you please order the following for this patient?  Echocardiogram of the heart 30-day heart monitor Carotid doppler studies   Hinton Dyer, would you send all the referrals as needed please? thanks

## 2016-03-06 NOTE — Telephone Encounter (Signed)
Called and spoke to [name] they have order for ECHO and Cardiac Event Monitor . [name] will call patient to schedule.

## 2016-03-06 NOTE — Telephone Encounter (Signed)
Placed orders as requested. Gave to [place] for scheduling.

## 2016-03-07 DIAGNOSIS — M9902 Segmental and somatic dysfunction of thoracic region: Secondary | ICD-10-CM | POA: Diagnosis not present

## 2016-03-07 DIAGNOSIS — M9901 Segmental and somatic dysfunction of cervical region: Secondary | ICD-10-CM | POA: Diagnosis not present

## 2016-03-07 DIAGNOSIS — M9903 Segmental and somatic dysfunction of lumbar region: Secondary | ICD-10-CM | POA: Diagnosis not present

## 2016-03-07 DIAGNOSIS — R51 Headache: Secondary | ICD-10-CM | POA: Diagnosis not present

## 2016-03-07 DIAGNOSIS — M5137 Other intervertebral disc degeneration, lumbosacral region: Secondary | ICD-10-CM | POA: Diagnosis not present

## 2016-03-07 DIAGNOSIS — S29012A Strain of muscle and tendon of back wall of thorax, initial encounter: Secondary | ICD-10-CM | POA: Diagnosis not present

## 2016-03-07 DIAGNOSIS — M4317 Spondylolisthesis, lumbosacral region: Secondary | ICD-10-CM | POA: Diagnosis not present

## 2016-03-13 DIAGNOSIS — F909 Attention-deficit hyperactivity disorder, unspecified type: Secondary | ICD-10-CM | POA: Diagnosis not present

## 2016-03-13 DIAGNOSIS — Z1389 Encounter for screening for other disorder: Secondary | ICD-10-CM | POA: Diagnosis not present

## 2016-03-13 DIAGNOSIS — F329 Major depressive disorder, single episode, unspecified: Secondary | ICD-10-CM | POA: Diagnosis not present

## 2016-03-13 DIAGNOSIS — I10 Essential (primary) hypertension: Secondary | ICD-10-CM | POA: Diagnosis not present

## 2016-03-17 ENCOUNTER — Ambulatory Visit (INDEPENDENT_AMBULATORY_CARE_PROVIDER_SITE_OTHER): Payer: Medicare Other | Admitting: Neurology

## 2016-03-17 ENCOUNTER — Encounter: Payer: Self-pay | Admitting: Neurology

## 2016-03-17 VITALS — BP 110/70 | HR 64 | Ht 67.0 in | Wt 234.0 lb

## 2016-03-17 DIAGNOSIS — M5481 Occipital neuralgia: Secondary | ICD-10-CM | POA: Diagnosis not present

## 2016-03-17 DIAGNOSIS — G43709 Chronic migraine without aura, not intractable, without status migrainosus: Secondary | ICD-10-CM | POA: Diagnosis not present

## 2016-03-17 DIAGNOSIS — M542 Cervicalgia: Secondary | ICD-10-CM | POA: Diagnosis not present

## 2016-03-17 DIAGNOSIS — G243 Spasmodic torticollis: Secondary | ICD-10-CM

## 2016-03-17 NOTE — Progress Notes (Signed)
Lidocaine 2%--2.5 mL Lot: [id number] Expiration 10/2019 NDC: [id number]  Depo-medrol 80mg /mL--22mL Lot: [id number] Expiration: 12/2016 NDC: [id number]  Marcaine 0.5%--2.71mL Lot: [id number] Expiration: 07/15/2017 NDC: [id number]

## 2016-03-17 NOTE — Progress Notes (Signed)
[id number]    Provider: Dr [name] Referring Provider: [name], MD Primary Care Physician: [name], MD  CC: Chronic migraines, cervical dystonia, occipital neuralgia.  Interval history 03/17/2016: She is on Topiramate. Still having migraines. She has not heard back from Dr. Donney Dice' s office. She has 20 headaches a month or more and 9 are migrainous. Pain on the right side of the head. Light sensitivity, sound sensitivity, nausea no vomiting. Pounding, throbbing. On Topamax for migraines helping but still with significant headaches.  Also significant pain in one muscle of the right side of the neck. This has been going on for years without relief and is slowly progressively getting worse.  She has significant pain, decreased ROM which is chronic for over one year. She gets regular massage. She's been to physical therapy. We have performed multiple trigger point injections with Depo-Medrol, lidocaine and Marcaine. She has tried baclofen, Cymbalta, oxycodone, Lyrica, gabapentin, Robaxin without any relief.   She ls also having pain radiating from the right side of the neck to the occipital area. Difficult to differentiate from migraines but appears to be different.   Medications tried include; Topamax, b-blocker,multiple occipital and cervical trigger point injections with Depo-Medrol, lidocaine and Marcaine. She has tried baclofen, Cymbalta, oxycodone, Lyrica, gabapentin, Robaxin without any relief.    Interval history 02/28/2016; Patient returns to discuss MRI of the brain with possible embolic strokes. Discussed with patient and husband. Reviewed all images with patient and husband, explained image results. Discussed strokes, types, evaluation. Will perform a stroke workup including carotid dopplers, 30-day heart monitor, echo, mra of the brain.Started daily asa 81mg  and zetia for HLD (cannot take statins). May need a loop recorder.    IMPRESSION: This MRI of the  brain with and without contrast shows the following: 1. Two small wedge-shaped foci in the left hemisphere consistent with prior small ischemic strokes. As both involve the gray matter and juxtacortical white matter, consider an embolic etiology 2. Chronic microvascular ischemic change elsewhere, typical extent for age. 3. Cortical atrophy, more than expected for age. 4. There is a normal enhancement pattern and there are no acute findings.  Original HPI: Ekta Tesoriero Lawhead is a 70 y.o. female here for evaluation of headaches as a second opinion from Dr. Brett Fairy. She has had neck pain for 7-8 months. Startes in the muscle and radiates up and to the back right of the head. She has had massage and they work hard on it and it helps. She has pain in the back of the head. She has been told it is muscular. Pain on movement of the head. She went to an ENT. Can't figure what it is. She put voltaren gel it helps. Heat and stretching helps. She went to physical therapy. She has decreased ROM to the right.   Headaches worsening the last 6 months. She reports Vision changes with migraines. She has migraines. No aura. Started when she was a teenager, she has always had them. No FHx. Headaches start on the right. More throbbing pain on the bilateral temples and the back of the right head. The painful neck exacerbates it. She has nausea. She has had vomiting. She takes phenergan. She has to go into a dark room, sounds bothers her. She is having them daily for the last 6 months. Before then better controlled. She has 30 headache days a month, 15 migrainous a month. They can last all day long. No medication overuse. She has pain all over, leg pain, knee  pain and she is managed by a pain clinic and uses oxycodone. She was on Topamax in the past. Tried topamax in the past. She has failed amitriptyline. Tried Toradol, tried imitrex, tried cymbalta.   Reviewed outside physician, imaging and lab reports:  Bmp with  slightly dec gfr 54, creat 1.04 03/2015  10/2015: CT soft tissue of the neck 10/2015 personally reviewe dimages: Skeleton: Cervical disc and facet degeneration. Mild anterior slip C3-4, C4-5, and C5-6 due to disc and facet degeneration. No fracture or mass lesion.  Upper chest: Lung apices are clear.  IMPRESSION: Negative for mass or adenopathy in the neck. No acute abnormality  Review of Systems: Patient complains of symptoms per HPI as well as the following symptoms: murmur, swelling in legs, joint pain. Pertinent negatives per HPI. All others negative.     Social History   Social History  . Marital Status: Married    Spouse Name: Pilar Jarvis  . Number of Children: 3  . Years of Education: college   Occupational History  . Retired    Social History Main Topics  . Smoking status: Never Smoker   . Smokeless tobacco: Not on file  . Alcohol Use: No  . Drug Use: No  . Sexual Activity: Yes    Birth Control/ Protection: Post-menopausal   Other Topics Concern  . Not on file   Social History Narrative   Caffeine 8-10 cups daily.  (coffee 1 cup am, unsw tea all day long).     Family History  Problem Relation Age of Onset  . CAD Mother   . Hypertension Mother   . Neuropathy Mother   . Osteoarthritis Mother   . Heart disease Mother   . Breast cancer Maternal Grandmother   . CAD Paternal Grandfather   . Colon cancer Father     Past Medical History  Diagnosis Date  . Sleep apnea     uses CPAP  . H/O hiatal hernia     surgery for hernia  . Arthritis     generalized  . Leg cramps     takes Flexeril daily as needed  . Restless leg syndrome     takes Requip daily  . Vitamin D deficiency   . Osteoporosis   . Tubular adenoma of colon   . Gestational diabetes   . Diverticulosis     benign  . Dyslipidemia   . Atrophic vaginitis   . DJD (degenerative joint disease)   . Vertigo   . 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 Depression     takes Cymbalta daily  .  Insomnia     takes Ambien nightly  . GERD (gastroesophageal reflux disease)     takes Nexium daily  . Dysrhythmia   . Asthma   . Pneumonia 62yrs ago    hx of  . History of bronchitis 1 yr ago  . Headache(784.0)     takes Imitrex daily as needed and Bisoprolol daily;last migraine was about 2wks ago  . Peripheral neuropathy (HCC)     takes Gabapentin daily  . Joint pain   . Joint swelling   . Back pain     DDD/stenosis  . Family history of GI bleeding   . Nocturia   . 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 History of shingles   . RLS (restless legs syndrome) 08/16/2015  . Carotid stenosis     Carotid US 10/16: Plaque RICA (123456), normal LICA  Past Surgical History  Procedure Laterality Date  . Gastric bypass  2001  . Skin reduction  2003 2004    stomach and arm  . Wisdom tooth extraction    . Excess skin removal      post weight loss  . Hernia repair      umbilical  . Colonoscopy    . Esophagogastroduodenoscopy    . Joint replacement Right     knee  . Steroid injection to scar      x 2 in back   . 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 amphetamine-dextroamphetamine (ADDERALL) 10 MG tablet Take 10 mg by mouth daily with breakfast.   0  . baclofen (LIORESAL) 10 MG tablet Take 1 tablet (10 mg total) by mouth 3 (three) times daily. 90 each 12  . bisoprolol (ZEBETA) 10 MG tablet Take 10 mg by mouth 2 (two) times daily.    . calcium carbonate (OS-CAL) 1250 (500 CA) MG chewable tablet Chew 1 tablet by mouth daily. Not sure of dosage    . diclofenac sodium (VOLTAREN) 1 % GEL Apply 2 g topically 4 (four) times daily. 100 g 12  . DULoxetine (CYMBALTA) 30 MG capsule Take 30 mg by mouth 2 (two) times daily.     Marland Kitchen esomeprazole (NEXIUM) 40 MG capsule Take 40  mg by mouth 2 (two) times daily before a meal.   6  . ezetimibe (ZETIA) 10 MG tablet Take 1 tablet (10 mg total) by mouth daily. 30 tablet 11  . Loratadine (CLARITIN) 10 MG CAPS Take 1 capsule by mouth as needed.    . ondansetron (ZOFRAN ODT) 4 MG disintegrating tablet Take 1 tablet (4 mg total) by mouth every 8 (eight) hours as needed for nausea or vomiting. 20 tablet 0  . pregabalin (LYRICA) 100 MG capsule Take 1 capsule (100 mg total) by mouth 2 (two) times daily. 60 capsule 5  . PROAIR HFA 108 (90 Base) MCG/ACT inhaler INL 2 PFS PO Q 4 H PRN  5  . quiNINE (QUALAQUIN) 324 MG capsule TAKE 1 CAPSULE AFTER LUNCH AND AFTER DINNER. 60 capsule 6  . rOPINIRole (REQUIP) 1 MG tablet TAKE 1 TABLET(1 MG) BY MOUTH AT BEDTIME AS NEEDED. MAY REPEAT DOSE 1 TIME AS NEEDED 180 tablet 0  . topiramate (TOPAMAX) 100 MG tablet Take 2 tablets (200 mg total) by mouth at bedtime. 60 tablet 11  . Vitamin D, Ergocalciferol, (DRISDOL) 50000 units CAPS capsule Take 1 capsule (50,000 Units total) by mouth every 7 (seven) days. 12 capsule 1  . zolpidem (AMBIEN) 10 MG tablet Take 10 mg by mouth at bedtime.   4   No current facility-administered medications for this visit.    Allergies as of 03/17/2016  . (No Known Allergies)    Vitals: BP 110/70 mmHg  Pulse 64  Ht 5\' 7"  (1.702 m)  Wt 234 lb (106.142 kg)  BMI 36.64 kg/m2  SpO2 97% Last Weight:  Wt Readings from Last 1 Encounters:  03/17/16 234 lb (106.142 kg)   Last Height:   Ht Readings from Last 1 Encounters:  03/17/16 5\' 7"  (1.702 m)       Physical exam: Exam: Gen: NAD, conversant, well nourised, obese, well groomed  CV: RRR, no MRG. No Carotid Bruits. No peripheral edema, warm,  nontender Eyes: Conjunctivae clear without exudates or hemorrhage MSK: Elevated right shoulder, right laterocollis, hypertrophied right trapezius muscle.   Neuro: Detailed Neurologic Exam  Speech:  Speech is normal; fluent and spontaneous with  normal comprehension.  Cognition:  The patient is oriented to person, place, and time;   recent and remote memory intact;   language fluent;   normal attention, concentration,   fund of knowledge Cranial Nerves:  The pupils are equal, round, and reactive to light. The fundi are normal. Visual fields are full to finger confrontation. Extraocular movements are intact. Trigeminal sensation is intact and the muscles of mastication are normal. The face is symmetric. The palate elevates in the midline. Hearing intact. Voice is normal. Shoulder shrug is normal. The tongue has normal motion without fasciculations.    Assessment/Plan: 70 year old female with chronic migraines, embolic looking strokes on MRI of the brain  Migraine: ContinueTopiramate for migraine and promethazine for nausea and vomiting during migraine Botox for migraine, has had one injection. Needs follow up with Drs. Read Drivers and Naaman Plummer for coninuation of botox migraine Cervical Dystonia: Patient has cervical dystonia of the right side. She has significant pain, decreased ROM which is chronic for over one year. She gets regular massage. She's been to physical therapy. We have performed multiple trigger point injections with Depo-Medrol, lidocaine and Marcaine. She has tried baclofen, Cymbalta, oxycodone, Lyrica, gabapentin, Robaxin without any relief. Patient would benefit from Botox therapy for her decreased range of motion, progressive continued neck pain. We'll request approval for cervical dystonia injections and see if Drs. Read Drivers or Naaman Plummer can continue this.  will request 150 unit of botox: 50 right levator scapula, 50 right trapezius, 50 semispinalis capitus  Remote stroke: Will perform a stroke workup including carotid dopplers, 30-day heart monitor, echo, mra of the brain.Started daily asa 81mg  and zetia for HLD (cannot take statins). May need a loop recorder.   Occipital nerve pain: Also suspect she has  occipital nerve pain. Also suspect she has some cervical dystonia on the right. Approval for additional botox for the cervical dystonia. Referral to dr dalton bethea for occipital c3 nerve branch blocks.   I had a long d/w patient about her recent stroke, risk for recurrent stroke/TIAs, personally independently reviewed imaging studies and stroke evaluation results and answered questions.Start ASA 81 for secondary stroke prevention and maintain strict control of hypertension with blood pressure goal below 130/90, diabetes with hemoglobin A1c goal below 6.5% and lipids with LDL cholesterol goal below 70 mg/dL.Patient has h/o statin intolerance hence recommend Zetia. I also advised the patient to eat a healthy diet with plenty of whole grains, cereals, fruits and vegetables, exercise regularly and maintain ideal body weight.   NERVE BLOCK PROCEDURE NOTE   Procedure: Patient was consented for right occipital and right cervical paraspinal trigger point injections. A solution containing 0.5% 5mg /ml Bupivacaine 2.5-cc. Lidocaine 2% 2.5-cc and and 80mg  Depo Medrol 1cc was prepared in 3 3-CC syringes with 30 gauge 1/2 inch needle.   8 Target areas in the suboccipital, occipital and cervical regions were identified via palpation and pain response.The sites junctions were sterilized with alcohol wipes.The contents of each syringe was injected in a fanlike fashion. The headache improved from 6/10 to 0/10. Patient tolerated the procedure well and no complications were noted.    Consent was provided below and patient acknowledged understanding:   Lidocaine 2%--2.5 mL  Lot: [id number]  Expiration 10/2019  NDC: [id number]   Depo-medrol 80mg /mL--63mL  Lot: [id number]  Expiration:  12/2016  NDC: [id number]   Marcaine 0.5%--2.54mL  Lot: [id number]  Expiration: 07/15/2017  NDC: [id number]    What to expect afterwards?  Immediately after the injection, the back of your head may feel warm and numb. You may  also experience reduction in the pain. The local anaesthetic wears off in a few hours and the steroid usually takes  3-7 days to take effect.   The pain relief is vary variable and can last from a few days to several months. Some patients do not experience any pain relief. Hence it is difficult to predict the outcome of the injection treatment in a particular patient.   There may be some discomfort at the injection site for a couple of days after treatment, however, this should settle quite quickly. We advise you to take things easy for the rest of the day. Continue taking your pain medication as advised by your consultant or until you feel benefit from the treatment.   What are the side effects / complications?  Common   Soreness / bruising at the injection site.   Temporary increase (up to 7 days) in pain following procedure.   Rare   Bleeding   Infection at the injection site   Allergic reaction   New pain   Worsening pain       Sarina Ill, MD  [facility] [address] [place], [place] [address]  Phone [phone] Fax [phone]  A total of 45 minutes was spent face-to-face with this patient. Over half this time was spent on counseling patient on the stroke diagnosis and different diagnostic and therapeutic options available.

## 2016-03-19 DIAGNOSIS — M5481 Occipital neuralgia: Secondary | ICD-10-CM | POA: Insufficient documentation

## 2016-03-19 DIAGNOSIS — G43709 Chronic migraine without aura, not intractable, without status migrainosus: Secondary | ICD-10-CM | POA: Insufficient documentation

## 2016-03-19 DIAGNOSIS — G243 Spasmodic torticollis: Secondary | ICD-10-CM | POA: Insufficient documentation

## 2016-03-19 DIAGNOSIS — M542 Cervicalgia: Secondary | ICD-10-CM | POA: Insufficient documentation

## 2016-03-20 ENCOUNTER — Ambulatory Visit (HOSPITAL_COMMUNITY): Payer: Medicare Other | Attending: Cardiology

## 2016-03-20 ENCOUNTER — Ambulatory Visit (INDEPENDENT_AMBULATORY_CARE_PROVIDER_SITE_OTHER): Payer: Medicare Other

## 2016-03-20 ENCOUNTER — Other Ambulatory Visit: Payer: Self-pay

## 2016-03-20 ENCOUNTER — Other Ambulatory Visit: Payer: Self-pay | Admitting: Neurology

## 2016-03-20 DIAGNOSIS — I639 Cerebral infarction, unspecified: Secondary | ICD-10-CM

## 2016-03-20 DIAGNOSIS — I071 Rheumatic tricuspid insufficiency: Secondary | ICD-10-CM | POA: Insufficient documentation

## 2016-03-20 DIAGNOSIS — I63412 Cerebral infarction due to embolism of left middle cerebral artery: Secondary | ICD-10-CM

## 2016-03-20 DIAGNOSIS — I4891 Unspecified atrial fibrillation: Secondary | ICD-10-CM

## 2016-03-22 ENCOUNTER — Telehealth: Payer: Self-pay | Admitting: Neurology

## 2016-03-22 NOTE — Telephone Encounter (Signed)
Tried calling patient, left message. Will try again tomorrow.  Echo normal except for finding below thanks.   The right ventricular systolic pressure was increased consistent with moderate pulmonary hypertension.

## 2016-03-24 ENCOUNTER — Ambulatory Visit
Admission: RE | Admit: 2016-03-24 | Discharge: 2016-03-24 | Disposition: A | Discharge status: Home / Self Care | Payer: Medicare Other | Source: Ambulatory Visit | Attending: Neurology | Admitting: Neurology

## 2016-03-24 DIAGNOSIS — I639 Cerebral infarction, unspecified: Secondary | ICD-10-CM

## 2016-03-24 DIAGNOSIS — I63412 Cerebral infarction due to embolism of left middle cerebral artery: Secondary | ICD-10-CM

## 2016-03-24 DIAGNOSIS — I63231 Cerebral infarction due to unspecified occlusion or stenosis of right carotid arteries: Secondary | ICD-10-CM | POA: Diagnosis not present

## 2016-03-24 DIAGNOSIS — I4891 Unspecified atrial fibrillation: Secondary | ICD-10-CM

## 2016-03-24 IMAGING — US US CAROTID DUPLEX BILAT
1 series · 13 of 24 positions shown · non-contrast
Comparison: Brain MRI-earlier same day

CLINICAL DATA: CVA. Ischemic stroke. History of hypertension and
hyperlipidemia.

EXAM:
BILATERAL CAROTID DUPLEX ULTRASOUND
TECHNIQUE: Gray scale imaging, color Doppler and duplex ultrasound were
performed of bilateral carotid and vertebral arteries in the neck.

[Series 1: us carotid duplex bilat · 0.08mm/px · 13 of 69 slices shown]
[im 1/69]
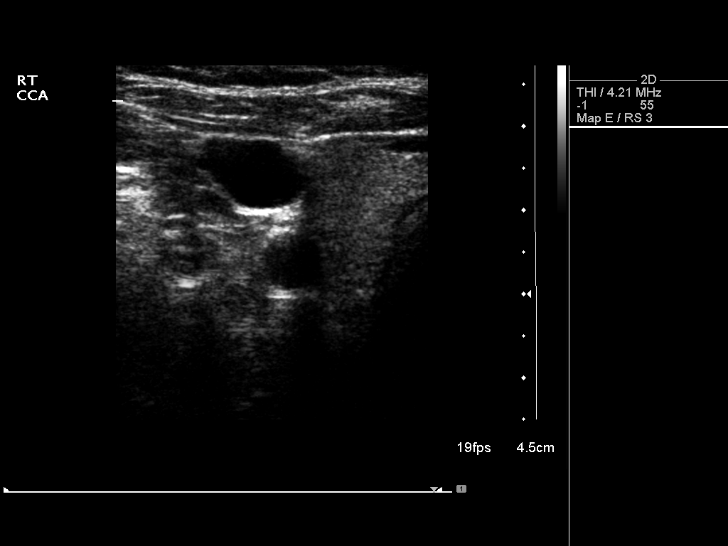
[im 6/69]
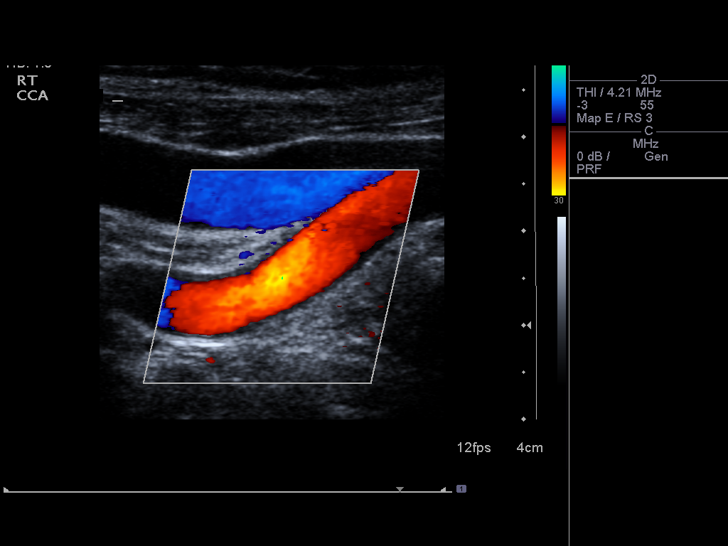
[im 12/69]
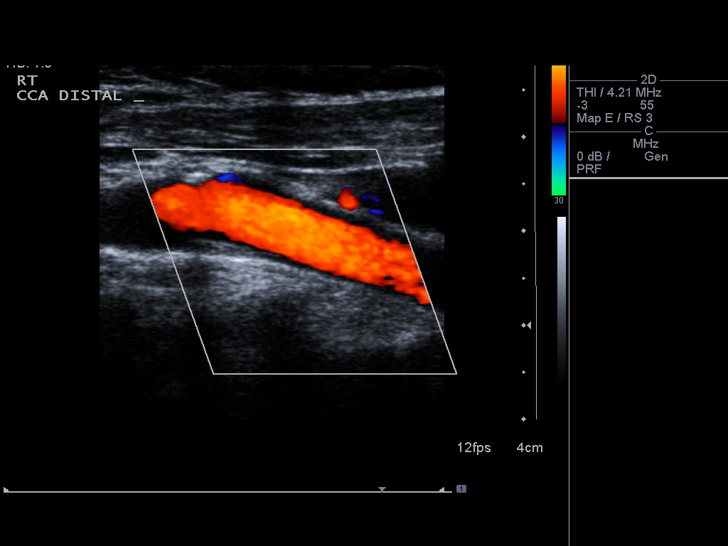
[im 18/69]
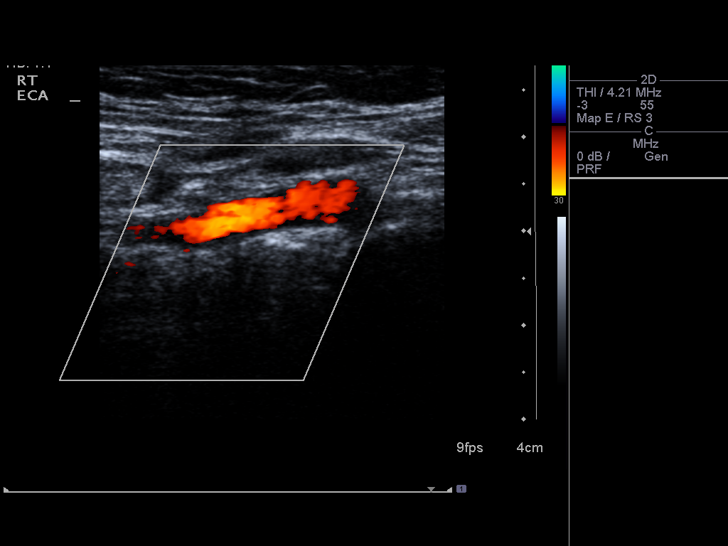
[im 24/69]
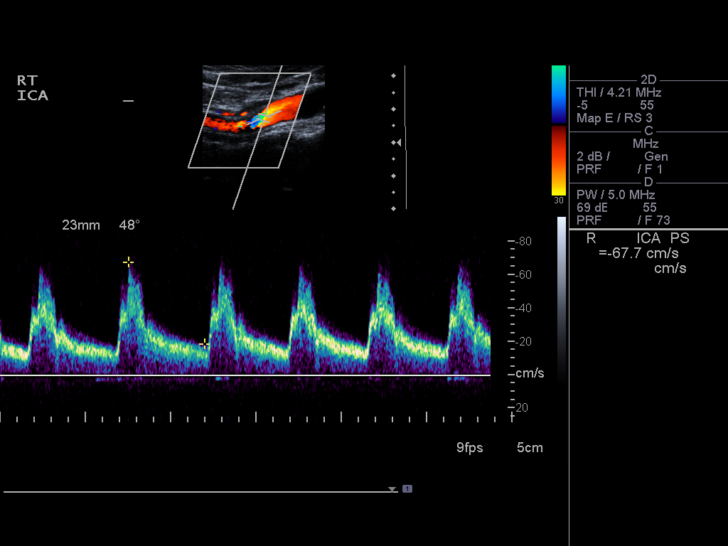
[im 30/69]
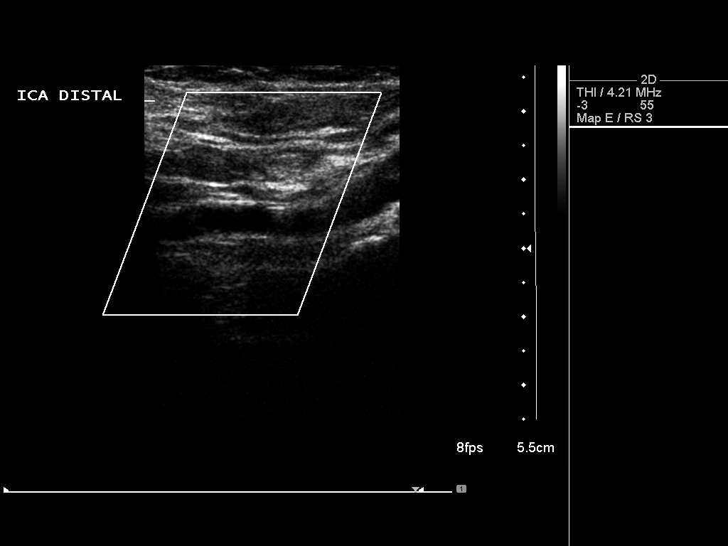
[im 36/69]
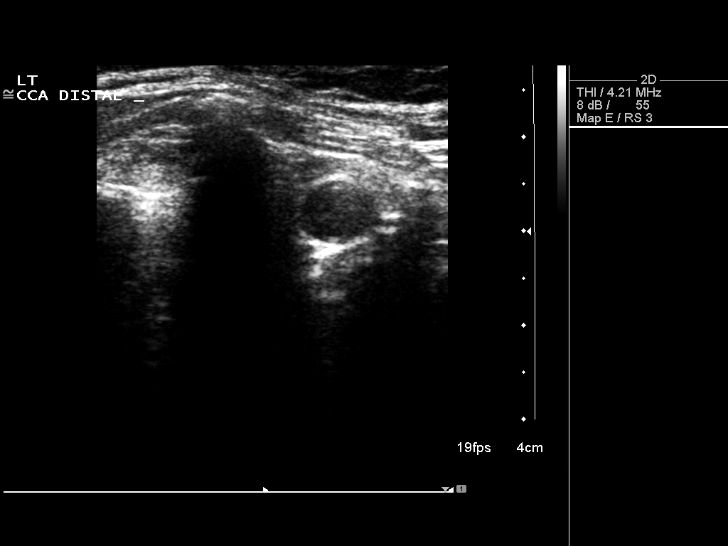
[im 39/69]
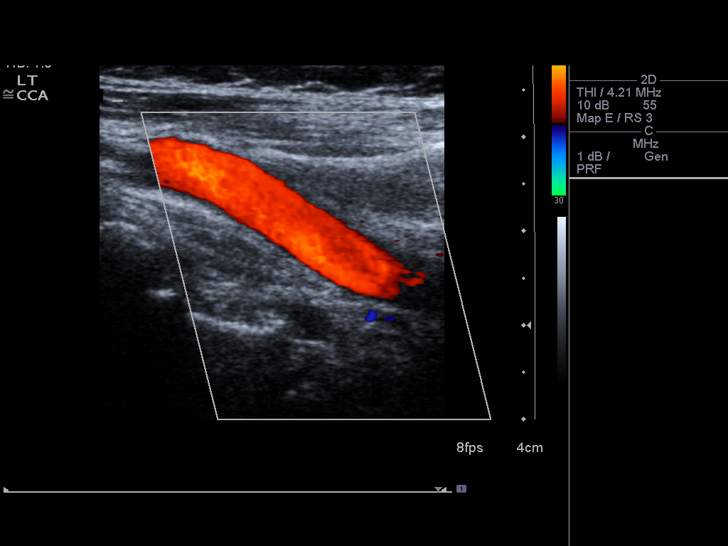
[im 45/69]
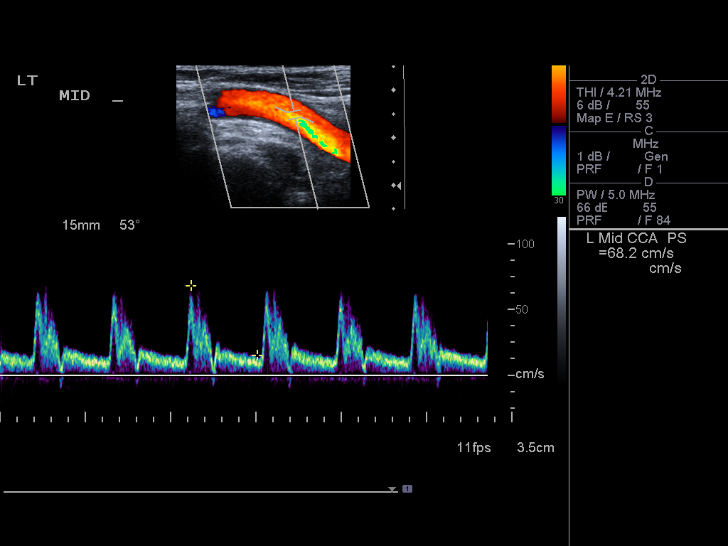
[im 51/69]
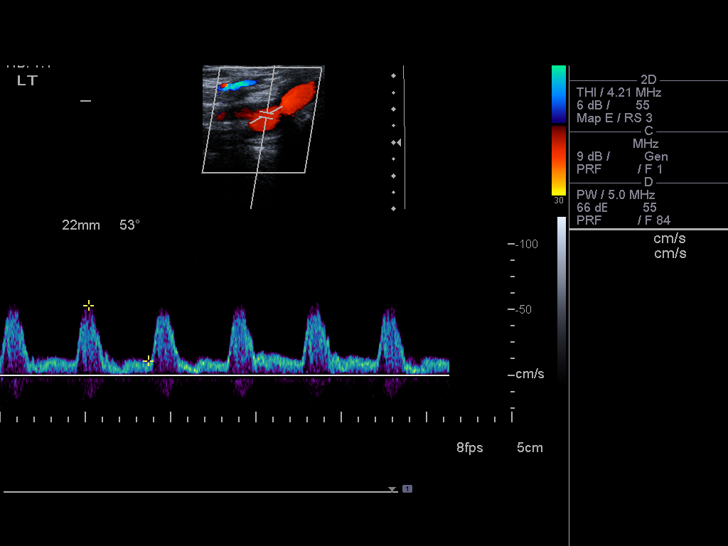
[im 57/69]
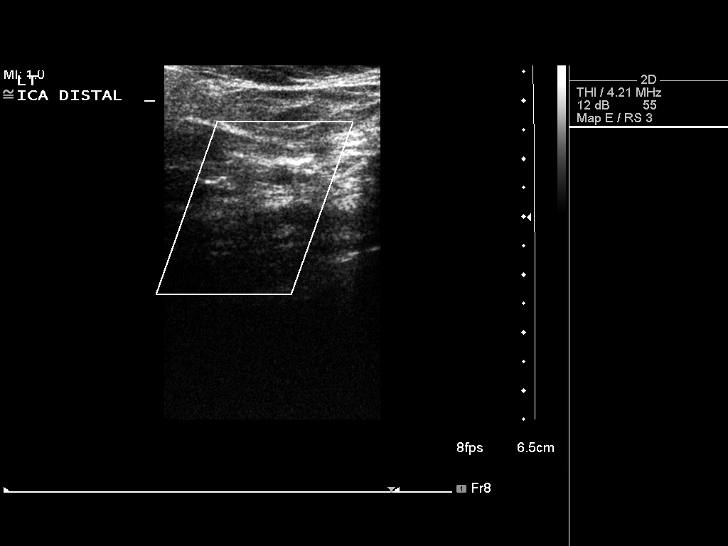
[im 63/69]
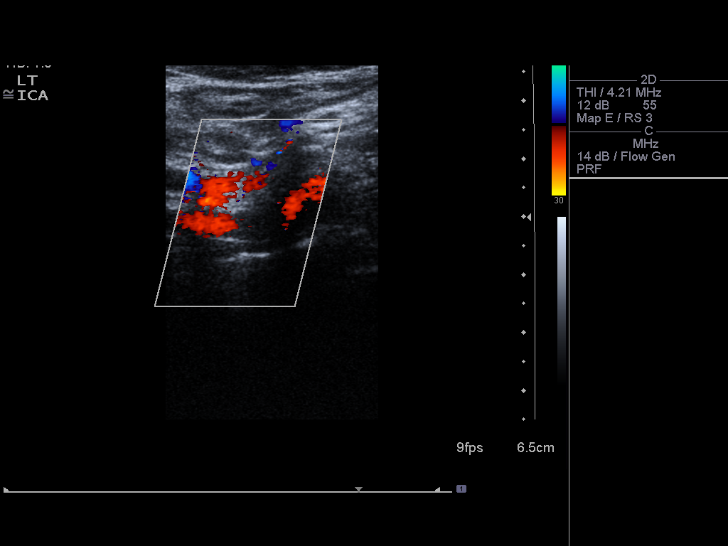
[im 69/69]
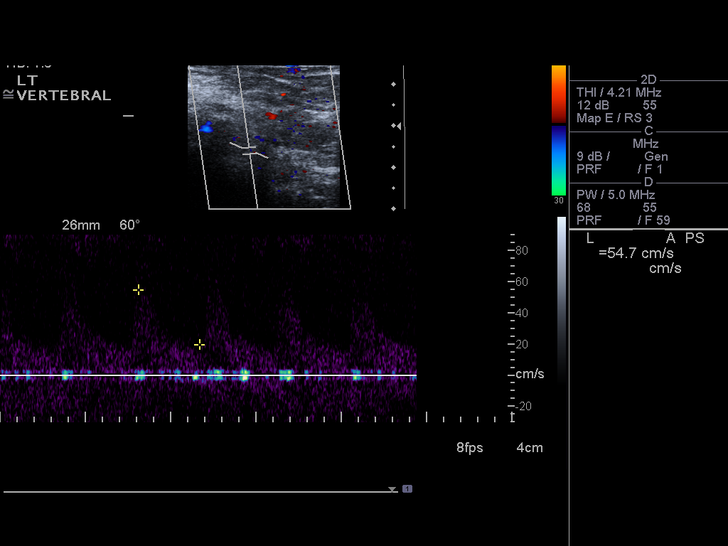

[13 of 24 positions shown; findings below may reference images not displayed]

FINDINGS: Criteria: Quantification of carotid stenosis is based on velocity
parameters that correlate the residual internal carotid diameter
with NASCET-based stenosis levels, using the diameter of the distal
internal carotid lumen as the denominator for stenosis measurement.

The following velocity measurements were obtained:

RIGHT

ICA:  101/25 cm/sec

CCA:  86/11 cm/sec

SYSTOLIC ICA/CCA RATIO:

DIASTOLIC ICA/CCA RATIO:

ECA:  60 cm/sec

LEFT

ICA:  72/27 cm/sec

CCA:  81/15 cm/sec

SYSTOLIC ICA/CCA RATIO:

DIASTOLIC ICA/CCA RATIO:

ECA:  55 cm/sec

RIGHT CAROTID ARTERY: There is a minimal amount of eccentric mixed
echogenic plaque within the right carotid bulb (image 5 and 17), not
resulting in elevated peak systolic velocities within the
interrogated course right internal carotid artery to suggest a
hemodynamically significant stenosis.

RIGHT VERTEBRAL ARTERY:  Antegrade the

LEFT CAROTID ARTERY: There is no grayscale evidence of significant
intimal thickening or atherosclerotic plaque affecting the
interrogated portions of the left carotid system. There are no
elevated peak systolic velocities within the interrogated course of
the left internal carotid artery to suggest a hemodynamically
significant stenosis.

LEFT VERTEBRAL ARTERY:  Antegrade flow
IMPRESSION: 1. Minimal amount of right-sided atherosclerotic plaque, not
resulting in a hemodynamically significant stenosis.
2. Unremarkable sonographic appearance of the left carotid system.

## 2016-03-25 NOTE — Telephone Encounter (Signed)
LVM for pt to call about results. Gave [facility] phone number.  

## 2016-03-25 NOTE — Telephone Encounter (Signed)
[name], can you please call patient. I tried calling and left a message.  The echo did not find any cause of her stroke which is good new. It did show something called pulmonary hypertension of which hers is moderate. This is a type of high blood pressure that affects the arteries of her lungs. She should follow up with her primary care about this for further evaluation. There are many causes. We are happy to forward results to her primary care.   The carotids were negative. Minimal amount of atherosclerotic plaque, not resulting in a hemodynamically significant stenosis.  thanks

## 2016-03-25 NOTE — Telephone Encounter (Signed)
Pt returned call

## 2016-03-25 NOTE — Telephone Encounter (Signed)
Called and spoke to pt about results per Dr [name] note below. Advised pulmonary hypertension can cause sx such as dizziness, SOB, chest pressure. She will f.u with PCP about this. Advised I will send results to PCP, [name] as well via EPIC. She verbalized understanding.

## 2016-03-28 DIAGNOSIS — M542 Cervicalgia: Secondary | ICD-10-CM | POA: Diagnosis not present

## 2016-03-28 DIAGNOSIS — M5481 Occipital neuralgia: Secondary | ICD-10-CM | POA: Diagnosis not present

## 2016-03-28 DIAGNOSIS — M47812 Spondylosis without myelopathy or radiculopathy, cervical region: Secondary | ICD-10-CM | POA: Diagnosis not present

## 2016-03-28 DIAGNOSIS — G894 Chronic pain syndrome: Secondary | ICD-10-CM | POA: Diagnosis not present

## 2016-04-02 ENCOUNTER — Telehealth: Payer: Self-pay | Admitting: Neurology

## 2016-04-02 NOTE — Telephone Encounter (Addendum)
Pt called said Dr Maudie Mercury said she was the perfect candidate for the cholesterol injection. She said Dr Maudie Mercury has written the drug company regarding this. In turn the drug company sent back a letter requesting a letter from her cardiologist (who is Dr Daneen Schick) stating why she needed it. Patient is asking Dr [name] to do this, said she went to her cardiologist office to get an appointment but he was booked until November 2017. She said a note was to be sent to provider letting him know all her health issues, but she has not heard back from the office. (Patient will call back with name of cholesterol drug and the company, she will also call her cardiologist and let him know of the above) She is also inquiring about an appointment for botox.

## 2016-04-02 NOTE — Telephone Encounter (Signed)
error 

## 2016-04-02 NOTE — Telephone Encounter (Signed)
Dr [name]- please advise. I am having Danielle call pt about the botox. She was referred out because of her insurance. You did her first injection and we referred her out.

## 2016-04-02 NOTE — Telephone Encounter (Signed)
We can schedule another botox injection for buy and bill since no one has called her from Dr. Naaman Plummer office. Also I placed a referral for cervical dystonia, we can do that first injection as well. [name], let's discuss thanks

## 2016-04-02 NOTE — Telephone Encounter (Addendum)
Called patient and spoke with her regarding botox injections. She stated that no one had called her to schedule injections after referral and that she had discussed receiving another injection in our office with Dr. [name] at her last visit. I told her I would talk to [facility] RN or Dr. [name] and call her back.

## 2016-04-04 DIAGNOSIS — M4317 Spondylolisthesis, lumbosacral region: Secondary | ICD-10-CM | POA: Diagnosis not present

## 2016-04-04 DIAGNOSIS — M5137 Other intervertebral disc degeneration, lumbosacral region: Secondary | ICD-10-CM | POA: Diagnosis not present

## 2016-04-04 DIAGNOSIS — M9902 Segmental and somatic dysfunction of thoracic region: Secondary | ICD-10-CM | POA: Diagnosis not present

## 2016-04-04 DIAGNOSIS — M9903 Segmental and somatic dysfunction of lumbar region: Secondary | ICD-10-CM | POA: Diagnosis not present

## 2016-04-04 DIAGNOSIS — R51 Headache: Secondary | ICD-10-CM | POA: Diagnosis not present

## 2016-04-04 DIAGNOSIS — M9901 Segmental and somatic dysfunction of cervical region: Secondary | ICD-10-CM | POA: Diagnosis not present

## 2016-04-04 DIAGNOSIS — S29012A Strain of muscle and tendon of back wall of thorax, initial encounter: Secondary | ICD-10-CM | POA: Diagnosis not present

## 2016-04-07 NOTE — Telephone Encounter (Signed)
[name] sent referral for botox injections to Dr Elnita Maxwell on 03/17/16. We will resend message to [name] to contact pt and get scheduled after we schedule pt for injection at our office.

## 2016-04-08 NOTE — Telephone Encounter (Signed)
Dr Ahern- FYI 

## 2016-04-08 NOTE — Telephone Encounter (Signed)
Can we schedule a buy and bill botox for her for migraines as well as for cervical dystonia until Dr. Letta Pate can see her?  Unfortunately it may take [facility] a few months to get her scheduled, they are very busy.

## 2016-04-09 ENCOUNTER — Encounter: Payer: Self-pay | Admitting: Neurology

## 2016-04-09 ENCOUNTER — Ambulatory Visit (INDEPENDENT_AMBULATORY_CARE_PROVIDER_SITE_OTHER): Payer: Medicare Other | Admitting: Neurology

## 2016-04-09 VITALS — BP 100/68 | HR 78 | Resp 20 | Ht 66.0 in | Wt 227.0 lb

## 2016-04-09 DIAGNOSIS — M9901 Segmental and somatic dysfunction of cervical region: Secondary | ICD-10-CM | POA: Diagnosis not present

## 2016-04-09 DIAGNOSIS — G4733 Obstructive sleep apnea (adult) (pediatric): Secondary | ICD-10-CM | POA: Diagnosis not present

## 2016-04-09 DIAGNOSIS — Z9989 Dependence on other enabling machines and devices: Secondary | ICD-10-CM

## 2016-04-09 DIAGNOSIS — R51 Headache: Secondary | ICD-10-CM | POA: Diagnosis not present

## 2016-04-09 DIAGNOSIS — M5481 Occipital neuralgia: Secondary | ICD-10-CM | POA: Diagnosis not present

## 2016-04-09 DIAGNOSIS — M9903 Segmental and somatic dysfunction of lumbar region: Secondary | ICD-10-CM | POA: Diagnosis not present

## 2016-04-09 DIAGNOSIS — I639 Cerebral infarction, unspecified: Secondary | ICD-10-CM | POA: Diagnosis not present

## 2016-04-09 DIAGNOSIS — S29012A Strain of muscle and tendon of back wall of thorax, initial encounter: Secondary | ICD-10-CM | POA: Diagnosis not present

## 2016-04-09 DIAGNOSIS — M5137 Other intervertebral disc degeneration, lumbosacral region: Secondary | ICD-10-CM | POA: Diagnosis not present

## 2016-04-09 DIAGNOSIS — M4317 Spondylolisthesis, lumbosacral region: Secondary | ICD-10-CM | POA: Diagnosis not present

## 2016-04-09 DIAGNOSIS — M9902 Segmental and somatic dysfunction of thoracic region: Secondary | ICD-10-CM | POA: Diagnosis not present

## 2016-04-09 NOTE — Progress Notes (Signed)
Pt requested a copy of her MRI Brain, US Carotid Doppler, and ECHO results ordered by Dr. [name]. She is bringing them to her PCP for review tomorrow. Pt signed a MR release form for these documents. MR release form sent to MR to be placed in pt's chart.

## 2016-04-09 NOTE — Telephone Encounter (Signed)
Called patient to schedule apt but there was no answer. Will call back again later today.

## 2016-04-09 NOTE — Progress Notes (Signed)
[facility]   Provider:  Larey Seat, M D  Referring Provider: [name], MD Primary Care Physician:    Chief Complaint  Patient presents with  . Follow-up    RLS, [name] is giving pt botox, rm 78, alone    HPI:  [name] is a 70 y.o. female  Is seen here as a revisit for snoring and restless legs.  I have followed [name] from 2003 through 2008 in the past. The patient had undergone doing this. A gastric bypass surgery which led to an extensive loss of body weight. In the process she was no longer suffering from obstructive sleep apnea related to the weight loss, but she developed restless legs. Repeated iron studies had shown that she had extremely low ferritin levels. A ferritin level lower than 50 is associated with a much higher incidence of restless leg syndrome in addition Janice iron and iron binding capacities have been surprisingly normal and this conflict of data we have discussed today. Usually I would treat the patient with Ferrlecit and infusion once every 3 months or so to keep up ferritin levels in in Janice case I am not sure how much free iron is actually in Janice system. 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 In addition she is vitamin D deficient which are almost 80% of adult Americans. Gained some weight back but she has no longer nausea or dumping syndrome so therefore she does not need Phenergan or Zofran. She is using gabapentin and ropinirole to treat Janice restless legs she often wakes up with a headache and she suffers from at times severe crippling migraines. Leg cramps also interfere with Janice sleep the initiation of sleep as well as the sleep duration at night.  As Janice BMI exceeds 35, she may also have developed apnea again and snoring has been witnessed by Janice family. We are meeting today to reevaluate all these conditions in context. She is also excessively daytime sleepy for fatigue severity score is endorsed  at 63 points Janice Epworth sleepiness score was endorsed at 15 points. Bedtime is usually 10 Pm and Janice husband leaves for work at 6.30 AM , after that she will sleep for 2-3 hours. Total daily sleep time is 14 hours, this is more likely related to depression.   Interval history from 08-16-15 [name] reports that the iron infusion that she received under [name] care helped Janice greatly for while . By July 2016 she was supposed to follow-up with him but he has meanwhile started Janice teaching job at the hospital and was not able to follow up.  So she is here again because Janice restless legs are acting up again. She has always responded to iron infusions and as she is status post gastric bypass surgery she does not have a normal absorption for by mouth iron. Janice ferritin levels have been clinically and critically low. She underwent a repeat sleep study on 01-10-15 after endorsing the Epworth Sleepiness Scale at 19 points.   Janice AHI was 30.3 Janice RDI was 38.6 supine AHI was 44.5. Was titrated to 10 cm water pressure the titration was not optimal. She was therefore prescribed an hour to titrate Janice. Now Janice residual AHI is 2.8 and she remains on an O2 sat at 7 and 15 cm water with full-time EPR of 3 cm. I'm able to obtain a download here in office and reviewed with the patient she has been 100%  compliance for days of use, 87% compliance for over 4 hours of consecutive use and average user time is 6 hours 49 minutes. 95th percentile pressure is 11.5 cm water.  Janice restless leg syndrome quality of life questionnaire was endorsed at moderate to severe impairment and the RLS 6 rating scale was endorsed at severe impairment. We are going to infuse Janice once again with iron. She is on Requip 2 mg total. I will see if I can enroll Janice into our restless leg study. The patient just had a follow-up appointment with [name] Janice primary care physician,   Today is 12-05-15.  #1   She is doing moderately well with  Janice CPAP use was a compliance of 73% average user time on days of use is 4 hours and 44 minutes. Minimum pressure for this AutoSet 7 maximum 15 cm water. The patient's AHI was 2.5 which is a desirable result. The 95th percentile pressure is 12.3. The patient could not use the machine for about 2 weeks after she contracted an upper respiratory infection doing a vacation stay in [place]. She is now back to using it regularly.  #2 Janice restless leg question here of was endorsed at moderate impairment quality of life.  It seems that Janice iron levels have rebounded to some degree , but ferritin results are pending. Janice geriatric depression score does not indicate depression Janice Epworth sleepiness score was endorsed at 8 points and Janice fatigue severity score 26 points.  #3 headache are a concern. She hates the pain clinic. We are not providing pain management in this clinic but I do feel that the patient could change with the same narcotic agreement and contract to [name]  Janice main care provider. She has no history of overusing up using or accidentally overdosing any medications. Janice depression scores are in normal range not indicated clinical depression at all. The patient is aware that she could receive with a headache attack Depakote IV. However I am not thrilled with the opportunity of preventing migrainous headaches by Depakote, given the risk of weight gain, liver failure tremor. She has been on a beta blocker which has helped to some degree. She would be interested in some kind of injection therapy but I explained that Botox injections in the Medicare population on very difficult to get paid for. And Botox is an extremely costly medication these days. The patient has some basic medical training and she would be willing to give herself a shot. We discussed TORADOL  which can be taken either as a 10 mg 2 pill or as an IM medication.  #4 patient will receive 1 mL equaling 2,000,000 g of Toradol intramuscular  injection today for Janice acute headache. If this works well for Janice it could be an alternative treatment and hopefully will relieve Janice of the need to take narcotics to. The patient is a status post gastric bypass surgery so she cannot have nonsteroidal by mouth .  Interval history from 04/09/2016. [name] is seen here today after she has twice been seen by my colleague [name]. I had sent Janice to Janice to evaluate Janice headaches and possible dystonia and neuralgic character and she was referred for Dr. Kittie [name] for possible Botox injections. Within the workup for headaches and MRI of the brain was obtained. This showed 2 small wedge shaped foci in the left hemisphere consistent with prior small ischemic strokes. She was started on aspirin baby size and Zetia. There was chronic microvascular ischemic  change as expected for age, some cortical atrophy no acute findings. At the time that she saw [name] she had neck pain for 7-8 months already. Voltaren gel helps a little bit pain on movement. She has migraines without aura that started already when she was a teenager. Tried topiramate in the past has failed amitriptyline, try Toradol, tried Imitrex , Cymbalta.  She has weaned off all pain medications.  CT of the soft tissue of the neck was also reviewed ; there is evidence of cervical disc and facet degeneration. An echocardiogram was ordered revealing a patent foramen ovale or she is scheduled for an occipital nerve block with Dr. Dr. Kittie [name], he will do a second one next coming Friday. This has helped Janice neck pain. She has not seen Janice cardiologist.   Review of Systems: Out of a complete 14 system review, the patient complains of only the following symptoms, and all other reviewed systems are negative. Restless legs she often wakes up with a headache and she suffers from at times severe crippling migraines.  Leg cramps also interfere with Janice sleep the initiation of sleep as well as the sleep duration at  night.  Janice daughter [name] and [name] have been extremely sleepy. Family history   Epworth score  5 from 3 , Fatigue severity score 24 from  34   , depression score 3. She started exercising after knee surgery . She lost a lot of weight, she is exercising and she weaned aff all narcotics.     Social History   Social History  . Marital Status: Married    Spouse Name: [name]  . Number of Children: 3  . Years of Education: college   Occupational History  . Retired    Social History Main Topics  . Smoking status: Never Smoker   . Smokeless tobacco: Not on file  . Alcohol Use: No  . Drug Use: No  . Sexual Activity: Yes    Birth Control/ Protection: Post-menopausal   Other Topics Concern  . Not on file   Social History Narrative   Caffeine 8-10 cups daily.  (coffee 1 cup am, unsw tea all day long).     Family History  Problem Relation Age of Onset  . CAD [name]   . Hypertension [name]   . Neuropathy [name]   . Osteoarthritis [name]   . Heart disease [name]   . Breast cancer Maternal Grandmother   . CAD Paternal Grandfather   . Colon cancer Father     Past Medical History  Diagnosis Date  . Sleep apnea     uses CPAP  . H/O hiatal hernia     surgery for hernia  . Arthritis     generalized  . Leg cramps     takes Flexeril daily as needed  . Restless leg syndrome     takes Requip daily  . Vitamin D deficiency   . Osteoporosis   . Tubular adenoma of colon   . Gestational diabetes   . Diverticulosis     benign  . Dyslipidemia   . Atrophic vaginitis   . DJD (degenerative joint disease)   . Vertigo   . 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 Depression     takes Cymbalta daily  . Insomnia     takes Ambien nightly  . GERD (gastroesophageal reflux disease)     takes Nexium daily  . Dysrhythmia   . Asthma   . Pneumonia 7yrs ago  hx of  . History of bronchitis 1 yr ago  . Headache(784.0)     takes Imitrex daily as needed and Bisoprolol  daily;last migraine was about 2wks ago  . Peripheral neuropathy (HCC)     takes Gabapentin daily  . Joint pain   . Joint swelling   . Back pain     DDD/stenosis  . Family history of GI bleeding   . Nocturia   . 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 History of shingles   . RLS (restless legs syndrome) 08/16/2015  . Carotid stenosis     Carotid US 10/16: Plaque RICA ([id number]), normal LICA    Current Outpatient Prescriptions  Medication Sig Dispense Refill  . amphetamine-dextroamphetamine (ADDERALL) 10 MG tablet Take 10 mg by mouth daily with breakfast.   0  . baclofen (LIORESAL) 10 MG tablet Take 1 tablet (10 mg total) by mouth 3 (three) times daily. 90 each 12  . bisoprolol (ZEBETA) 10 MG tablet Take 10 mg by mouth 2 (two) times daily.    . calcium carbonate (OS-CAL) 1250 (500 CA) MG chewable tablet Chew 1 tablet by mouth daily. Not sure of dosage    . diclofenac sodium (VOLTAREN) 1 % GEL Apply 2 g topically 4 (four) times daily. 100 g 12  . DULoxetine (CYMBALTA) 30 MG capsule Take 30 mg by mouth 2 (two) times daily.     Marland Kitchen esomeprazole (NEXIUM) 40 MG capsule Take 40 mg by mouth 2 (two) times daily before a meal.   6  . ezetimibe (ZETIA) 10 MG tablet Take 1 tablet (10 mg total) by mouth daily. 30 tablet 11  . Loratadine (CLARITIN) 10 MG CAPS Take 1 capsule by mouth as needed.    . ondansetron (ZOFRAN ODT) 4 MG disintegrating tablet Take 1 tablet (4 mg total) by mouth every 8 (eight) hours as needed for nausea or vomiting. 20 tablet 0  . pregabalin (LYRICA) 100 MG capsule Take 1 capsule (100 mg total) by mouth 2 (two) times daily. 60 capsule 5  . PROAIR HFA 108 (90 Base) MCG/ACT inhaler INL 2 PFS PO Q 4 H PRN  5  . quiNINE (QUALAQUIN) 324 MG capsule TAKE 1 CAPSULE AFTER LUNCH AND AFTER DINNER. 60 capsule 6  . rOPINIRole (REQUIP) 1 MG tablet TAKE 1 TABLET(1 MG) BY MOUTH AT BEDTIME AS NEEDED. MAY REPEAT DOSE 1 TIME AS NEEDED 180 tablet 0  . topiramate (TOPAMAX) 100 MG  tablet Take 2 tablets (200 mg total) by mouth at bedtime. 60 tablet 11  . Vitamin D, Ergocalciferol, (DRISDOL) 50000 units CAPS capsule Take 1 capsule (50,000 Units total) by mouth every 7 (seven) days. 12 capsule 1  . zolpidem (AMBIEN) 10 MG tablet Take 10 mg by mouth at bedtime.   4   No current facility-administered medications for this visit.    Allergies as of 04/09/2016  . (No Known Allergies)    Vitals: BP 100/68 mmHg  Pulse 78  Resp 20  Ht 5\' 6"  (1.676 m)  Wt 227 lb (102.967 kg)  BMI 36.66 kg/m2 Last Weight:  Wt Readings from Last 1 Encounters:  04/09/16 227 lb (102.967 kg)       Last Height:   Ht Readings from Last 1 Encounters:  04/09/16 5\' 6"  (1.676 m)    Physical exam:  General: The patient is awake, alert and appears not in acute distress. The patient is well groomed. Head: Normocephalic, atraumatic. Neck is now supple.  No tenderness. Mallampati 3 ,  neck circumference: 15 inches. Nasal airflow unrestricted, TMJ is not evident. Retrognathia is seen.  Cardiovascular:  Regular rate and rhythm, without  murmurs or carotid bruit, and without distended neck veins. Respiratory: Lungs are clear to auscultation. Skin:  Without evidence of edema, or rash, well healed knee replacement scars.  Trunk: BMI is elevated .  Neurologic exam :The patient is awake and alert, oriented to place and time.  Memory subjective described as intact.  There is a normal attention span & concentration ability. Speech is fluent without  dysarthria, dysphonia or aphasia. Mood and affect are appropriate. Cranial nerves: Pupils are equal and briskly reactive to light.  Extraocular movements  in vertical and horizontal planes intact and without nystagmus. Visual fields by finger perimetry are intact. Hearing to finger rub intact.  Facial sensation intact to fine touch. Facial motor strength is symmetric and tongue and uvula move midline. Shoulder shrug intact . Motor exam:   Normal tone, muscle  bulk and symmetric strength in all extremities. No cog wheeling and no waxy or clasp knife response.  Sensory:  Fine touch, pinprick and vibration were tested in all extremities. Proprioception in both feet is affeced by pin and needle dysesthesias.  Coordination: Rapid alternating movements in the fingers/hands is normal. Finger-to-nose maneuver normal without evidence of ataxia, dysmetria or tremor. Gait and station: Patient walks without assistive device . Strength within normal limits. Stance is stable and normal.  Deep tendon reflexes: in the  upper and lower extremities are symmetric and intact. Babinski maneuver downgoing.   Assessment:  After physical and neurologic examination, review of laboratory studies, imaging, neurophysiology testing and pre-existing records, assessment is   The patient was advised of the nature of the diagnosed sleep disorder , the treatment options and risks for general a health and wellness arising from not treating the condition. Visit duration was 40 minutes.  Of today's new patient visit-consultation 50% of our face-to-face time are spent in discussion of the medical conditions found the differential diagnosis the testing and treatment options for the conditions. The patient is willing to undergo a new iron infusion cycle .  Janice cardiologists note's support this Pernell Dupre ,MD ) , Dr. Waymon Budge.  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  She is chronically iron deficient, she has been vitamin D deficient she has been vitamin B12 deficient. I have prescribed Janice a vitamin B12 nasal spray, vitamin D 50,000 units weekly pill, and I will obtain iron studies today to see which dose of an iron infusion would be indicated for Janice.  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 I would like for Janice to meet with a  cardiologist to discuss the relevance to Janice MRI findings as well as to Janice clinical symptoms of headaches. The pain in the neck and headaches have both improved and I hope that she can continue Botox therapy through Dr. Elnita Maxwell as well as trigger point injections. Nerve blocks for neuralgia are also ordered. A download of Janice machine was not yet obtained but I reviewed Janice restless leg symptomatic and Janice RLS 6 rating scale was endorsed at very low impediment there is no evidence at this time of major quality of life impairment based on restless leg intensity. She is not depressed. Is in the process of losing weight and she appears  optimistic and motivated. I'm especially proud that she weaned herself off all narcotics.  Plan:  Treatment plan and additional workup :  1 RLS with iron deficiency ) Dr. Beryle Beams  evaluated Janice discrepancy between very low ferritin levels  and apparently sufficient iron storage. She has maintained oral iron intake as well as multivitamins and multi minerals.   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 She is not diabetic she has no documented thyroid disease and neither did [name] nor has Janice daughter but all of these women half severe painful dysesthesias leg cramps and restless legs. She took quinine with good success, but had to order from [place].   3) OSA- continue on CPAP, auto PAP . Doing well ! The patient's auto CPAP is set between 7 and 15 cm water with 3 cm EPR she is 83% compliance with an average user time of 6 hours and 10 minutes. Residual AHI is 3.6. 91st percentile pressure is 13.9.  4)  arthritis pain, improved after knee surgery, Dr. Rhona Raider .   Rv in 6 month. With me.   I thank Dr Jaynee [name] for Janice headache expertise and treatment.  Asencion Partridge Fredy Gladu MD  04/09/2016

## 2016-04-10 DIAGNOSIS — I639 Cerebral infarction, unspecified: Secondary | ICD-10-CM | POA: Diagnosis not present

## 2016-04-10 DIAGNOSIS — I10 Essential (primary) hypertension: Secondary | ICD-10-CM | POA: Diagnosis not present

## 2016-04-10 DIAGNOSIS — I272 Other secondary pulmonary hypertension: Secondary | ICD-10-CM | POA: Diagnosis not present

## 2016-04-10 DIAGNOSIS — E7800 Pure hypercholesterolemia, unspecified: Secondary | ICD-10-CM | POA: Diagnosis not present

## 2016-04-10 NOTE — Telephone Encounter (Signed)
Met with the patient in office yesterday. We scheduled her next injection for our office.

## 2016-04-11 DIAGNOSIS — G894 Chronic pain syndrome: Secondary | ICD-10-CM | POA: Diagnosis not present

## 2016-04-11 DIAGNOSIS — M5481 Occipital neuralgia: Secondary | ICD-10-CM | POA: Diagnosis not present

## 2016-04-11 DIAGNOSIS — M47812 Spondylosis without myelopathy or radiculopathy, cervical region: Secondary | ICD-10-CM | POA: Diagnosis not present

## 2016-04-11 DIAGNOSIS — M542 Cervicalgia: Secondary | ICD-10-CM | POA: Diagnosis not present

## 2016-04-11 NOTE — Telephone Encounter (Signed)
Thank you Danielle. I will need 300-400 units of botox in the [facility] availale botox. thanks

## 2016-04-23 ENCOUNTER — Other Ambulatory Visit: Payer: Self-pay | Admitting: Neurology

## 2016-04-25 DIAGNOSIS — N3946 Mixed incontinence: Secondary | ICD-10-CM | POA: Diagnosis not present

## 2016-04-25 DIAGNOSIS — M5481 Occipital neuralgia: Secondary | ICD-10-CM | POA: Diagnosis not present

## 2016-04-25 DIAGNOSIS — M542 Cervicalgia: Secondary | ICD-10-CM | POA: Diagnosis not present

## 2016-04-25 DIAGNOSIS — G894 Chronic pain syndrome: Secondary | ICD-10-CM | POA: Diagnosis not present

## 2016-04-25 DIAGNOSIS — M47812 Spondylosis without myelopathy or radiculopathy, cervical region: Secondary | ICD-10-CM | POA: Diagnosis not present

## 2016-04-28 ENCOUNTER — Telehealth: Payer: Self-pay

## 2016-04-28 NOTE — Telephone Encounter (Signed)
I spoke to patient and she is aware of results. I reminded her of her next appt.

## 2016-04-28 NOTE — Telephone Encounter (Signed)
-----   Message from Melvenia Beam, MD sent at 04/28/2016 11:17 AM EDT ----- Cardiac event monitor normal thanks

## 2016-05-05 DIAGNOSIS — M25562 Pain in left knee: Secondary | ICD-10-CM | POA: Diagnosis not present

## 2016-05-05 DIAGNOSIS — M25522 Pain in left elbow: Secondary | ICD-10-CM | POA: Diagnosis not present

## 2016-05-15 DIAGNOSIS — M542 Cervicalgia: Secondary | ICD-10-CM | POA: Diagnosis not present

## 2016-05-15 DIAGNOSIS — G894 Chronic pain syndrome: Secondary | ICD-10-CM | POA: Diagnosis not present

## 2016-05-15 DIAGNOSIS — M5481 Occipital neuralgia: Secondary | ICD-10-CM | POA: Diagnosis not present

## 2016-05-15 DIAGNOSIS — M47812 Spondylosis without myelopathy or radiculopathy, cervical region: Secondary | ICD-10-CM | POA: Diagnosis not present

## 2016-05-19 DIAGNOSIS — M542 Cervicalgia: Secondary | ICD-10-CM | POA: Diagnosis not present

## 2016-05-19 DIAGNOSIS — G894 Chronic pain syndrome: Secondary | ICD-10-CM | POA: Diagnosis not present

## 2016-05-19 DIAGNOSIS — M47812 Spondylosis without myelopathy or radiculopathy, cervical region: Secondary | ICD-10-CM | POA: Diagnosis not present

## 2016-05-19 DIAGNOSIS — M5481 Occipital neuralgia: Secondary | ICD-10-CM | POA: Diagnosis not present

## 2016-05-22 ENCOUNTER — Encounter: Payer: Self-pay | Admitting: Neurology

## 2016-05-22 ENCOUNTER — Ambulatory Visit (INDEPENDENT_AMBULATORY_CARE_PROVIDER_SITE_OTHER): Payer: Medicare Other | Admitting: Neurology

## 2016-05-22 VITALS — BP 118/69 | HR 61

## 2016-05-22 DIAGNOSIS — G43711 Chronic migraine without aura, intractable, with status migrainosus: Secondary | ICD-10-CM | POA: Diagnosis not present

## 2016-05-22 DIAGNOSIS — G243 Spasmodic torticollis: Secondary | ICD-10-CM

## 2016-05-22 DIAGNOSIS — R1115 Cyclical vomiting syndrome unrelated to migraine: Secondary | ICD-10-CM

## 2016-05-22 DIAGNOSIS — R300 Dysuria: Secondary | ICD-10-CM | POA: Diagnosis not present

## 2016-05-22 DIAGNOSIS — G43001 Migraine without aura, not intractable, with status migrainosus: Secondary | ICD-10-CM

## 2016-05-22 NOTE — Progress Notes (Signed)
Consent Form Botulism Toxin Injection For Chronic Migraine and Cervical Dystonia  Botulism toxin has been approved by the Federal drug administration for treatment of chronic migraine. Botulism toxin does not cure chronic migraine and it may not be effective in some patients. This is reviewed before every injection.  The administration of botulism toxin is accomplished by injecting a small amount of toxin into the muscles of the neck and head. Dosage must be titrated for each individual. Any benefits resulting from botulism toxin tend to wear off after 3 months with a repeat injection required if benefit is to be maintained. Injections are usually done every 3-4 months with maximum effect peak achieved by about 2 or 3 weeks. Botulism toxin is expensive and you should be sure of what costs you will incur resulting from the injection.  The side effects of botulism toxin use for chronic migraine may include:   -Transient, and usually mild, facial weakness with facial injections  -Transient, and usually mild, head or neck weakness with head/neck injections  -Reduction or loss of forehead facial animation due to forehead muscle weakness  -Eyelid drooping  -Dry eye  -Pain at the site of injection or bruising at the site of injection  -Double vision  -Potential unknown long term risks  - Dysphasia   -Dysarthria   - Singing difficulty   - Neck weakness  Contraindications: You should not have Botox if you are pregnant, nursing, allergic to albumin, have an infection, skin condition, or muscle weakness at the site of the injection, or have myasthenia gravis, Lambert-Eaton syndrome, or ALS.  It is also possible that as with any injection, there may be an allergic reaction or no effect from the medication. Reduced effectiveness after repeated injections is sometimes seen and rarely infection at the injection site may occur. All care will be taken to prevent these side effects. If therapy is given  over a long time, atrophy and wasting in the muscle injected may occur. Occasionally the patient's become refractory to treatment because they develop antibodies to the toxin. In this event, therapy needs to be modified.  Patient understands risks of botox.    BOTOX PROCEDURE NOTE FOR MIGRAINE HEADACHE    Contraindications and precautions discussed with patient(above). Aseptic procedure was observed and patient tolerated procedure. Procedure performed by Dr. Georgia Dom  The condition has existed for more than 6 months, and pt does not have a diagnosis of ALS, Myasthenia Gravis or Lambert-Eaton Syndrome. Risks and benefits of injections discussed and pt agrees to proceed with the procedure. Written consent obtained  These injections are medically necessary. He receives good benefits from these injections. These injections do not cause sedations or hallucinations which the oral therapies may cause.  Indication/Diagnosis: chronic migraine BOTOX(J0585) injection was performed according to protocol by [employer]. 200 units of BOTOX was dissolved into 4 cc NS.  NDC: [id number]  Type of toxin: Botox  Lot # [id number] x 2 vials  EXP: 10/2018    Description of procedure:  The patient was placed in a sitting position. The standard protocol was used for Botox as follows, with 5 units of Botox injected at each site:   -Procerus muscle, midline injection  -Corrugator muscle, bilateral injection  -Frontalis muscle, bilateral injection, with 2 sites each side, medial injection was performed in the upper one third of the frontalis muscle, in the region vertical from the medial inferior edge of the superior orbital rim. The lateral injection was again in the upper one third of  the forehead vertically above the lateral limbus of the cornea, 1.5 cm lateral to the medial injection site.  -Temporalis muscle injection, 4 sites, bilaterally. The first injection was 3 cm above the tragus of the ear,  second injection site was 1.5 cm to 3 cm up from the first injection site in line with the tragus of the ear. The third injection site was 1.5-3 cm forward between the first 2 injection sites. The fourth injection site was 1.5 cm posterior to the second injection site.  -Occipitalis muscle injection, 3 sites, bilaterally. The first injection was done one half way between the occipital protuberance and the tip of the mastoid process behind the ear. The second injection site was done lateral and superior to the first, 1 fingerbreadth from the first injection. The third injection site was 1 fingerbreadth superiorly and medially from the first injection site.  -Cervical paraspinal muscle injection, 2 sites, bilateral knee first injection site was 1 cm from the midline of the cervical spine, 3 cm inferior to the lower border of the occipital protuberance. The second injection site was 1.5 cm superiorly and laterally to the first injection site.  -Trapezius muscle injection was performed at 3 sites, bilaterally. The first injection site was in the upper trapezius muscle halfway between the inflection point of the neck, and the acromion. The second injection site was one half way between the acromion and the first injection site. The third injection was done between the first injection site and the inflection point of the neck.   Will return for repeat injection in 3 months.   200 units of Botox was used, 155 units were injected, the rest of the Botox was wasted. The patient tolerated the procedure well, there were no complications of the above procedure.  Assessment/Plan:   Last injections were in January 11th, 2016   Response: She is much improved, feels functionality is better, legs have loosed up, personal hygiene is better, walking has improved, still has sustained clonus in the bilateral feet.  Side effects none   reviewed w/ pt the procedure of botulinum toxin, incl side effects - localized  weakness, inj site rxn, myalgia and spread from site of injection   Procedure note for Spasmodic torticollis:  EMG: EMG guidance was used to inject muscles detailed below. Aseptic procedure was performed and patient tolerated procedure. Procedure was performed by Dr. Myrla Halsted   Patient tolerated the procedure.    Refractory to oral medications   Units Injected 200 , Units wasted 0   Motrin / tylenol for injections site pain / soreness   REMS precautions handout given to patient   RTC - see instructions for details   Botox 200 units (100 units/2cc NS). Wasted 0 Units.  NDC: [phone]  j code botox type A [id number]  [id number] Expiration date 10/2019  Muscles injected: 40 right levator scapula(prox and distal), 120 right trapezius, 40 semispinalis capitus (see pictures taken that day of locations injected)    Janice Ill, MD  [facility]  [address] [place]  [place], [place] [address]  Phone [phone] Fax [phone]

## 2016-05-23 DIAGNOSIS — H35363 Drusen (degenerative) of macula, bilateral: Secondary | ICD-10-CM | POA: Diagnosis not present

## 2016-05-23 DIAGNOSIS — I708 Atherosclerosis of other arteries: Secondary | ICD-10-CM | POA: Diagnosis not present

## 2016-05-23 DIAGNOSIS — H35032 Hypertensive retinopathy, left eye: Secondary | ICD-10-CM | POA: Diagnosis not present

## 2016-05-23 DIAGNOSIS — H25013 Cortical age-related cataract, bilateral: Secondary | ICD-10-CM | POA: Diagnosis not present

## 2016-05-23 DIAGNOSIS — H43393 Other vitreous opacities, bilateral: Secondary | ICD-10-CM | POA: Diagnosis not present

## 2016-05-23 DIAGNOSIS — H2513 Age-related nuclear cataract, bilateral: Secondary | ICD-10-CM | POA: Diagnosis not present

## 2016-05-23 DIAGNOSIS — H35031 Hypertensive retinopathy, right eye: Secondary | ICD-10-CM | POA: Diagnosis not present

## 2016-05-25 MED ORDER — ONDANSETRON 4 MG PO TBDP: 4.0000 mg | ORAL_TABLET | Freq: Three times a day (TID) | ORAL | Status: DC | PRN

## 2016-05-25 MED ORDER — PREGABALIN 75 MG PO CAPS: 75.0000 mg | ORAL_CAPSULE | Freq: Two times a day (BID) | ORAL | Status: DC

## 2016-05-25 MED ORDER — FUROSEMIDE 20 MG PO TABS: 20.0000 mg | ORAL_TABLET | Freq: Two times a day (BID) | ORAL | Status: DC

## 2016-05-27 ENCOUNTER — Other Ambulatory Visit: Payer: Self-pay | Admitting: Neurology

## 2016-05-27 MED ORDER — PREGABALIN 75 MG PO CAPS: 75.0000 mg | ORAL_CAPSULE | Freq: Two times a day (BID) | ORAL | Status: DC

## 2016-05-28 ENCOUNTER — Encounter: Payer: Self-pay | Admitting: *Deleted

## 2016-05-28 NOTE — Progress Notes (Signed)
Faxed printed rx lyrica to pt pharmacy. [phone]. Received confirmation.

## 2016-05-29 ENCOUNTER — Telehealth: Payer: Self-pay | Admitting: *Deleted

## 2016-05-29 NOTE — Telephone Encounter (Signed)
Pt office faxed over to [facility] on 05/29/2016.

## 2016-06-02 DIAGNOSIS — E539 Vitamin B deficiency, unspecified: Secondary | ICD-10-CM | POA: Diagnosis not present

## 2016-06-02 DIAGNOSIS — D529 Folate deficiency anemia, unspecified: Secondary | ICD-10-CM | POA: Diagnosis not present

## 2016-06-02 DIAGNOSIS — E2839 Other primary ovarian failure: Secondary | ICD-10-CM | POA: Diagnosis not present

## 2016-06-02 DIAGNOSIS — G894 Chronic pain syndrome: Secondary | ICD-10-CM | POA: Diagnosis not present

## 2016-06-02 DIAGNOSIS — E7800 Pure hypercholesterolemia, unspecified: Secondary | ICD-10-CM | POA: Diagnosis not present

## 2016-06-02 DIAGNOSIS — E038 Other specified hypothyroidism: Secondary | ICD-10-CM | POA: Diagnosis not present

## 2016-06-02 DIAGNOSIS — E669 Obesity, unspecified: Secondary | ICD-10-CM | POA: Diagnosis not present

## 2016-06-02 DIAGNOSIS — E559 Vitamin D deficiency, unspecified: Secondary | ICD-10-CM | POA: Diagnosis not present

## 2016-06-02 DIAGNOSIS — E0789 Other specified disorders of thyroid: Secondary | ICD-10-CM | POA: Diagnosis not present

## 2016-06-02 DIAGNOSIS — E349 Endocrine disorder, unspecified: Secondary | ICD-10-CM | POA: Diagnosis not present

## 2016-06-02 DIAGNOSIS — E2749 Other adrenocortical insufficiency: Secondary | ICD-10-CM | POA: Diagnosis not present

## 2016-06-02 DIAGNOSIS — R6882 Decreased libido: Secondary | ICD-10-CM | POA: Diagnosis not present

## 2016-06-02 DIAGNOSIS — M542 Cervicalgia: Secondary | ICD-10-CM | POA: Diagnosis not present

## 2016-06-02 DIAGNOSIS — M47812 Spondylosis without myelopathy or radiculopathy, cervical region: Secondary | ICD-10-CM | POA: Diagnosis not present

## 2016-06-02 DIAGNOSIS — N951 Menopausal and female climacteric states: Secondary | ICD-10-CM | POA: Diagnosis not present

## 2016-06-04 ENCOUNTER — Ambulatory Visit: Payer: Medicare Other | Admitting: Neurology

## 2016-06-05 ENCOUNTER — Ambulatory Visit: Payer: Medicare Other | Admitting: Neurology

## 2016-06-05 ENCOUNTER — Other Ambulatory Visit: Payer: Self-pay

## 2016-06-05 DIAGNOSIS — E669 Obesity, unspecified: Secondary | ICD-10-CM | POA: Diagnosis not present

## 2016-06-05 DIAGNOSIS — R6882 Decreased libido: Secondary | ICD-10-CM | POA: Diagnosis not present

## 2016-06-05 DIAGNOSIS — E2839 Other primary ovarian failure: Secondary | ICD-10-CM | POA: Diagnosis not present

## 2016-06-05 DIAGNOSIS — M542 Cervicalgia: Secondary | ICD-10-CM | POA: Diagnosis not present

## 2016-06-05 DIAGNOSIS — M47812 Spondylosis without myelopathy or radiculopathy, cervical region: Secondary | ICD-10-CM | POA: Diagnosis not present

## 2016-06-05 DIAGNOSIS — E038 Other specified hypothyroidism: Secondary | ICD-10-CM | POA: Diagnosis not present

## 2016-06-05 DIAGNOSIS — G894 Chronic pain syndrome: Secondary | ICD-10-CM | POA: Diagnosis not present

## 2016-06-05 DIAGNOSIS — E559 Vitamin D deficiency, unspecified: Secondary | ICD-10-CM | POA: Diagnosis not present

## 2016-06-06 ENCOUNTER — Institutional Professional Consult (permissible substitution): Payer: Medicare Other | Admitting: Emergency Medicine

## 2016-06-09 DIAGNOSIS — I10 Essential (primary) hypertension: Secondary | ICD-10-CM | POA: Diagnosis not present

## 2016-06-09 DIAGNOSIS — R739 Hyperglycemia, unspecified: Secondary | ICD-10-CM | POA: Diagnosis not present

## 2016-06-10 DIAGNOSIS — M47812 Spondylosis without myelopathy or radiculopathy, cervical region: Secondary | ICD-10-CM | POA: Diagnosis not present

## 2016-06-10 DIAGNOSIS — G894 Chronic pain syndrome: Secondary | ICD-10-CM | POA: Diagnosis not present

## 2016-06-10 DIAGNOSIS — M542 Cervicalgia: Secondary | ICD-10-CM | POA: Diagnosis not present

## 2016-06-11 ENCOUNTER — Ambulatory Visit (INDEPENDENT_AMBULATORY_CARE_PROVIDER_SITE_OTHER): Payer: Medicare Other | Admitting: Emergency Medicine

## 2016-06-11 ENCOUNTER — Other Ambulatory Visit: Payer: Medicare Other

## 2016-06-11 ENCOUNTER — Encounter: Payer: Self-pay | Admitting: Emergency Medicine

## 2016-06-11 VITALS — BP 118/82 | HR 78 | Ht 66.0 in | Wt 233.0 lb

## 2016-06-11 DIAGNOSIS — Z9989 Dependence on other enabling machines and devices: Secondary | ICD-10-CM

## 2016-06-11 DIAGNOSIS — I272 Other secondary pulmonary hypertension: Secondary | ICD-10-CM | POA: Diagnosis not present

## 2016-06-11 DIAGNOSIS — I639 Cerebral infarction, unspecified: Secondary | ICD-10-CM

## 2016-06-11 DIAGNOSIS — I2729 Other secondary pulmonary hypertension: Secondary | ICD-10-CM | POA: Insufficient documentation

## 2016-06-11 DIAGNOSIS — IMO0002 Reserved for concepts with insufficient information to code with codable children: Secondary | ICD-10-CM

## 2016-06-11 DIAGNOSIS — G4733 Obstructive sleep apnea (adult) (pediatric): Secondary | ICD-10-CM

## 2016-06-11 LAB — RHEUMATOID FACTOR

## 2016-06-11 NOTE — Assessment & Plan Note (Addendum)
Noted on echocardiogram from April 2017 with an estimated PASP of 40 mmHg. Certainly several potential contributors:  - There has been some question of a fibrillation although this has not been established. Left ventricular function is intact and there is no evidence of diastolic dysfunction. Consider also systemic HTN although no established hx of this - She does have sleep apnea which I believe is adequately treated although we may decide to repeat a sleep study to investigate.  - no evidence to suggest an autoimmune process but we will screen for common causes with serologies. likewise no history to support HIV.  - she carries a diagnosis of possible asthma and may have underlying lung disease as a contributor. We will evaluate for exertional hypoxemia today. Perform pulmonary function testing. - Most importantly she has a history of taking fenfluramine/phentermine (Fen-Phen) for approximately 1 year back in the 90s.   We will perform serologies as above, pulmonary function testing as above. Walking oximetry today to rule out daytime hypoxemia. I believe she needs a ventilation/perfusion scan to evaluate for chronic thromboembolic disease. Depending on the evaluation above we will decide whether to refer her for right heart catheterization to definitively measure pulmonary pressures. She may merit medication therapy primary hypertension believe all the contributors are treated, particularly given her Fen-Phen exposure.

## 2016-06-11 NOTE — Progress Notes (Signed)
Subjective:    Patient ID: [name], female    DOB: 29-Sep-1946, 70 y.o.   MRN: [id number]  HPI 70 year old never smoker with a history of gastric bypass surgery, obstructive sleep apnea on CPAP, restless leg syndrome, migraines, B-12 and iron deficiency, history of CVA (MRI 02/24/16).  Underwent monitor for 30 days, no A fib seen . Seen by Dr Tamala Julian, cath in 2010, reassuring myoview in 2015. Also possible history of asthma per her office notes.  Diagnosis was made clinically, sometimes uses albuterol prn. When she has trouble she develops bronchitis sx, cough, dyspnea, wheeze. No PFT. A TTE was done April 2016 that showed normal LV and RV fxn but an estimated PASP of 61mmHg.   She was on diet meds in the past > Fen-Phen for about about a year back in the 90's.   Exertional SOB, occurs with walking up hill.  Has some increased LE edema Her weight has been stable for several years Denies any history of autoimmune disease or any autoimmune disease in her family  Review of Systems  Constitutional: Negative for fever and unexpected weight change.  HENT: Negative for congestion, dental problem, ear pain, nosebleeds, postnasal drip, rhinorrhea, sinus pressure, sneezing, sore throat and trouble swallowing.   Eyes: Negative for redness and itching.  Respiratory: Positive for shortness of breath. Negative for cough, chest tightness and wheezing.   Cardiovascular: Negative for palpitations and leg swelling.  Gastrointestinal: Negative for nausea and vomiting.  Genitourinary: Negative for dysuria.  Musculoskeletal: Negative for joint swelling.  Skin: Negative for rash.  Neurological: Negative for headaches.  Hematological: Does not bruise/bleed easily.  Psychiatric/Behavioral: Negative for dysphoric mood. The patient is not nervous/anxious.    Past Medical History  Diagnosis Date  . Sleep apnea     uses CPAP  . H/O hiatal hernia     surgery for hernia  . Arthritis     generalized  . Leg  cramps     takes Flexeril daily as needed  . Restless leg syndrome     takes Requip daily  . Vitamin D deficiency   . Osteoporosis   . Tubular adenoma of colon   . Gestational diabetes   . Diverticulosis     benign  . Dyslipidemia   . Atrophic vaginitis   . DJD (degenerative joint disease)   . Vertigo   . 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 Depression     takes Cymbalta daily  . Insomnia     takes Ambien nightly  . GERD (gastroesophageal reflux disease)     takes Nexium daily  . Dysrhythmia   . Asthma   . Pneumonia 35yrs ago    hx of  . History of bronchitis 1 yr ago  . Headache(784.0)     takes Imitrex daily as needed and Bisoprolol daily;last migraine was about 2wks ago  . Peripheral neuropathy (HCC)     takes Gabapentin daily  . Joint pain   . Joint swelling   . Back pain     DDD/stenosis  . Family history of GI bleeding   . Nocturia   . 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 History of shingles   . RLS (restless legs syndrome) 08/16/2015  . Carotid stenosis     Carotid US 10/16: Plaque RICA ([id number]), normal LICA     Family History  Problem Relation Age of Onset  . CAD Mother   . Hypertension Mother   .  Neuropathy Mother   . Osteoarthritis Mother   . Heart disease Mother   . Breast cancer Maternal Grandmother   . CAD Paternal Grandfather   . Colon cancer Father      Social History   Social History  . Marital Status: Married    Spouse Name: Pilar Jarvis  . Number of Children: 3  . Years of Education: college   Occupational History  . Retired    Social History Main Topics  . Smoking status: Never Smoker   . Smokeless tobacco: Not on file  . Alcohol Use: No  . Drug Use: No  . Sexual Activity: Yes    Birth Control/ Protection: Post-menopausal   Other Topics Concern  . Not on file   Social History Narrative   Caffeine 8-10 cups daily.  (coffee 1 cup am, unsw tea all day long).      No Known Allergies   Outpatient  Prescriptions Prior to Visit  Medication Sig Dispense Refill  . amphetamine-dextroamphetamine (ADDERALL) 10 MG tablet Take 10 mg by mouth daily with breakfast.   0  . baclofen (LIORESAL) 10 MG tablet Take 1 tablet (10 mg total) by mouth 3 (three) times daily. 90 each 12  . bisoprolol (ZEBETA) 10 MG tablet Take 10 mg by mouth 2 (two) times daily.    . calcium carbonate (OS-CAL) 1250 (500 CA) MG chewable tablet Chew 1 tablet by mouth daily. Not sure of dosage    . diclofenac sodium (VOLTAREN) 1 % GEL Apply 2 g topically 4 (four) times daily. 100 g 12  . DULoxetine (CYMBALTA) 30 MG capsule Take 30 mg by mouth 2 (two) times daily.     Marland Kitchen esomeprazole (NEXIUM) 40 MG capsule Take 40 mg by mouth 2 (two) times daily before a meal.   6  . ezetimibe (ZETIA) 10 MG tablet Take 1 tablet (10 mg total) by mouth daily. 30 tablet 11  . furosemide (LASIX) 20 MG tablet Take 1 tablet (20 mg total) by mouth 2 (two) times daily. 60 tablet 11  . Loratadine (CLARITIN) 10 MG CAPS Take 1 capsule by mouth as needed.    Marland Kitchen LYRICA 100 MG capsule Take 1 tablet by mouth 2 (two) times daily.  5  . ondansetron (ZOFRAN ODT) 4 MG disintegrating tablet Take 1 tablet (4 mg total) by mouth every 8 (eight) hours as needed for nausea or vomiting. 45 tablet 11  . PROAIR HFA 108 (90 Base) MCG/ACT inhaler INL 2 PFS PO Q 4 H PRN  5  . quiNINE (QUALAQUIN) 324 MG capsule TAKE 1 CAPSULE AFTER LUNCH AND AFTER DINNER. 60 capsule 6  . rOPINIRole (REQUIP) 1 MG tablet TAKE 1 TABLET BY MOUTH AT BEDTIME AS NEEDED. MAY REPEAT DOSE 1 TIME AS NEEDED 180 tablet 0  . topiramate (TOPAMAX) 100 MG tablet Take 2 tablets (200 mg total) by mouth at bedtime. 60 tablet 11  . Vitamin D, Ergocalciferol, (DRISDOL) 50000 units CAPS capsule Take 1 capsule (50,000 Units total) by mouth every 7 (seven) days. 12 capsule 1  . zolpidem (AMBIEN) 10 MG tablet Take 10 mg by mouth at bedtime.   4   No facility-administered medications prior to visit.         Objective:     Physical Exam Filed Vitals:   06/11/16 1207  BP: 118/82  Pulse: 78  Height: 5\' 6"  (1.676 m)  Weight: 233 lb (105.688 kg)  SpO2: 99%   Gen: Pleasant, overweight owman, in no distress,  normal  affect  ENT: No lesions,  mouth clear,  oropharynx clear, no postnasal drip  Neck: No JVD, no TMG, no carotid bruits  Lungs: No use of accessory muscles, clear without rales or rhonchi  Cardiovascular: RRR, heart sounds normal, 2/6 systolic M, trace LE edema  Musculoskeletal: No deformities, no cyanosis or clubbing  Neuro: alert, non focal  Skin: feet are slightly cyanotic, cool     Assessment & Plan:  OSA on CPAP Treated with CPAP with good compliance  Secondary pulmonary hypertension ([place]) Noted on echocardiogram from April 2017 with an estimated PASP of 40 mmHg. Certainly several potential contributors:  - There has been some question of a fibrillation although this has not been established. Left ventricular function is intact and there is no evidence of diastolic dysfunction. Consider also systemic HTN although no established hx of this - She does have sleep apnea which I believe is adequately treated although we may decide to repeat a sleep study to investigate.  - no evidence to suggest an autoimmune process but we will screen for common causes with serologies. likewise no history to support HIV.  - she carries a diagnosis of possible asthma and may have underlying lung disease as a contributor. We will evaluate for exertional hypoxemia today. Perform pulmonary function testing. - Most importantly she has a history of taking fenfluramine/phentermine (Fen-Phen) for approximately 1 year back in the 90s.   We will perform serologies as above, pulmonary function testing as above. Walking oximetry today to rule out daytime hypoxemia. I believe she needs a ventilation/perfusion scan to evaluate for chronic thromboembolic disease. Depending on the evaluation above we will decide whether to  refer her for right heart catheterization to definitively measure pulmonary pressures. She may merit medication therapy primary hypertension believe all the contributors are treated, particularly given her Fen-Phen exposure.    Baltazar Apo, MD, PhD 06/11/2016, 12:57 PM [facility] [phone] or if no answer [phone]

## 2016-06-11 NOTE — Patient Instructions (Addendum)
We will check lab work today.  We will check a ventilation-perfusion scan + CXR Walking oximetry today.  We will perform full pulmonary function testing Follow with Dr Lamonte Sakai next available after testing to review results.

## 2016-06-11 NOTE — Assessment & Plan Note (Signed)
Treated with CPAP with good compliance

## 2016-06-12 LAB — ANA COMPREHENSIVE PANEL
Anti JO-1: <0.2 AI (ref 0.0–0.9)
Centromere Ab Screen: <0.2 AI (ref 0.0–0.9)
Chromatin Ab SerPl-aCnc: <0.2 AI (ref 0.0–0.9)
ENA SSA (RO) Ab: <0.2 AI (ref 0.0–0.9)
ENA SSB (LA) Ab: <0.2 AI (ref 0.0–0.9)

## 2016-06-12 LAB — ANGIOTENSIN CONVERTING ENZYME: ANGIOTENSIN-CONVERTING ENZYME: 23 U/L (ref 8–52)

## 2016-06-12 LAB — ANTI-SCLERODERMA ANTIBODY: Scleroderma (Scl-70) (ENA) Antibody, IgG: <1

## 2016-06-12 LAB — RNP ANTIBODY: Ribonucleic Protein(ENA) Antibody, IgG: <1

## 2016-06-13 DIAGNOSIS — E559 Vitamin D deficiency, unspecified: Secondary | ICD-10-CM | POA: Diagnosis not present

## 2016-06-13 DIAGNOSIS — R739 Hyperglycemia, unspecified: Secondary | ICD-10-CM | POA: Diagnosis not present

## 2016-06-13 DIAGNOSIS — I639 Cerebral infarction, unspecified: Secondary | ICD-10-CM | POA: Diagnosis not present

## 2016-06-13 DIAGNOSIS — E7800 Pure hypercholesterolemia, unspecified: Secondary | ICD-10-CM | POA: Diagnosis not present

## 2016-06-18 ENCOUNTER — Encounter (HOSPITAL_COMMUNITY)
Admission: RE | Admit: 2016-06-18 | Discharge: 2016-06-18 | Disposition: A | Discharge status: Home / Self Care | Payer: Medicare Other | Source: Ambulatory Visit | Attending: Emergency Medicine | Admitting: Emergency Medicine

## 2016-06-18 ENCOUNTER — Ambulatory Visit (HOSPITAL_COMMUNITY)
Admission: RE | Admit: 2016-06-18 | Discharge: 2016-06-18 | Disposition: A | Discharge status: Home / Self Care | Payer: Medicare Other | Source: Ambulatory Visit | Attending: Emergency Medicine | Admitting: Emergency Medicine

## 2016-06-18 DIAGNOSIS — R0602 Shortness of breath: Secondary | ICD-10-CM | POA: Diagnosis not present

## 2016-06-18 DIAGNOSIS — I270 Primary pulmonary hypertension: Secondary | ICD-10-CM | POA: Diagnosis not present

## 2016-06-18 DIAGNOSIS — I272 Other secondary pulmonary hypertension: Secondary | ICD-10-CM | POA: Diagnosis not present

## 2016-06-18 IMAGING — DX DG CHEST 2V
2 series · 2 of 2 positions shown · non-contrast
Comparison: [DATE] [DATE]

CLINICAL DATA: Shortness of breath.  Patient for V/Q scan.

EXAM:
CHEST  2 VIEW

[chest pa]
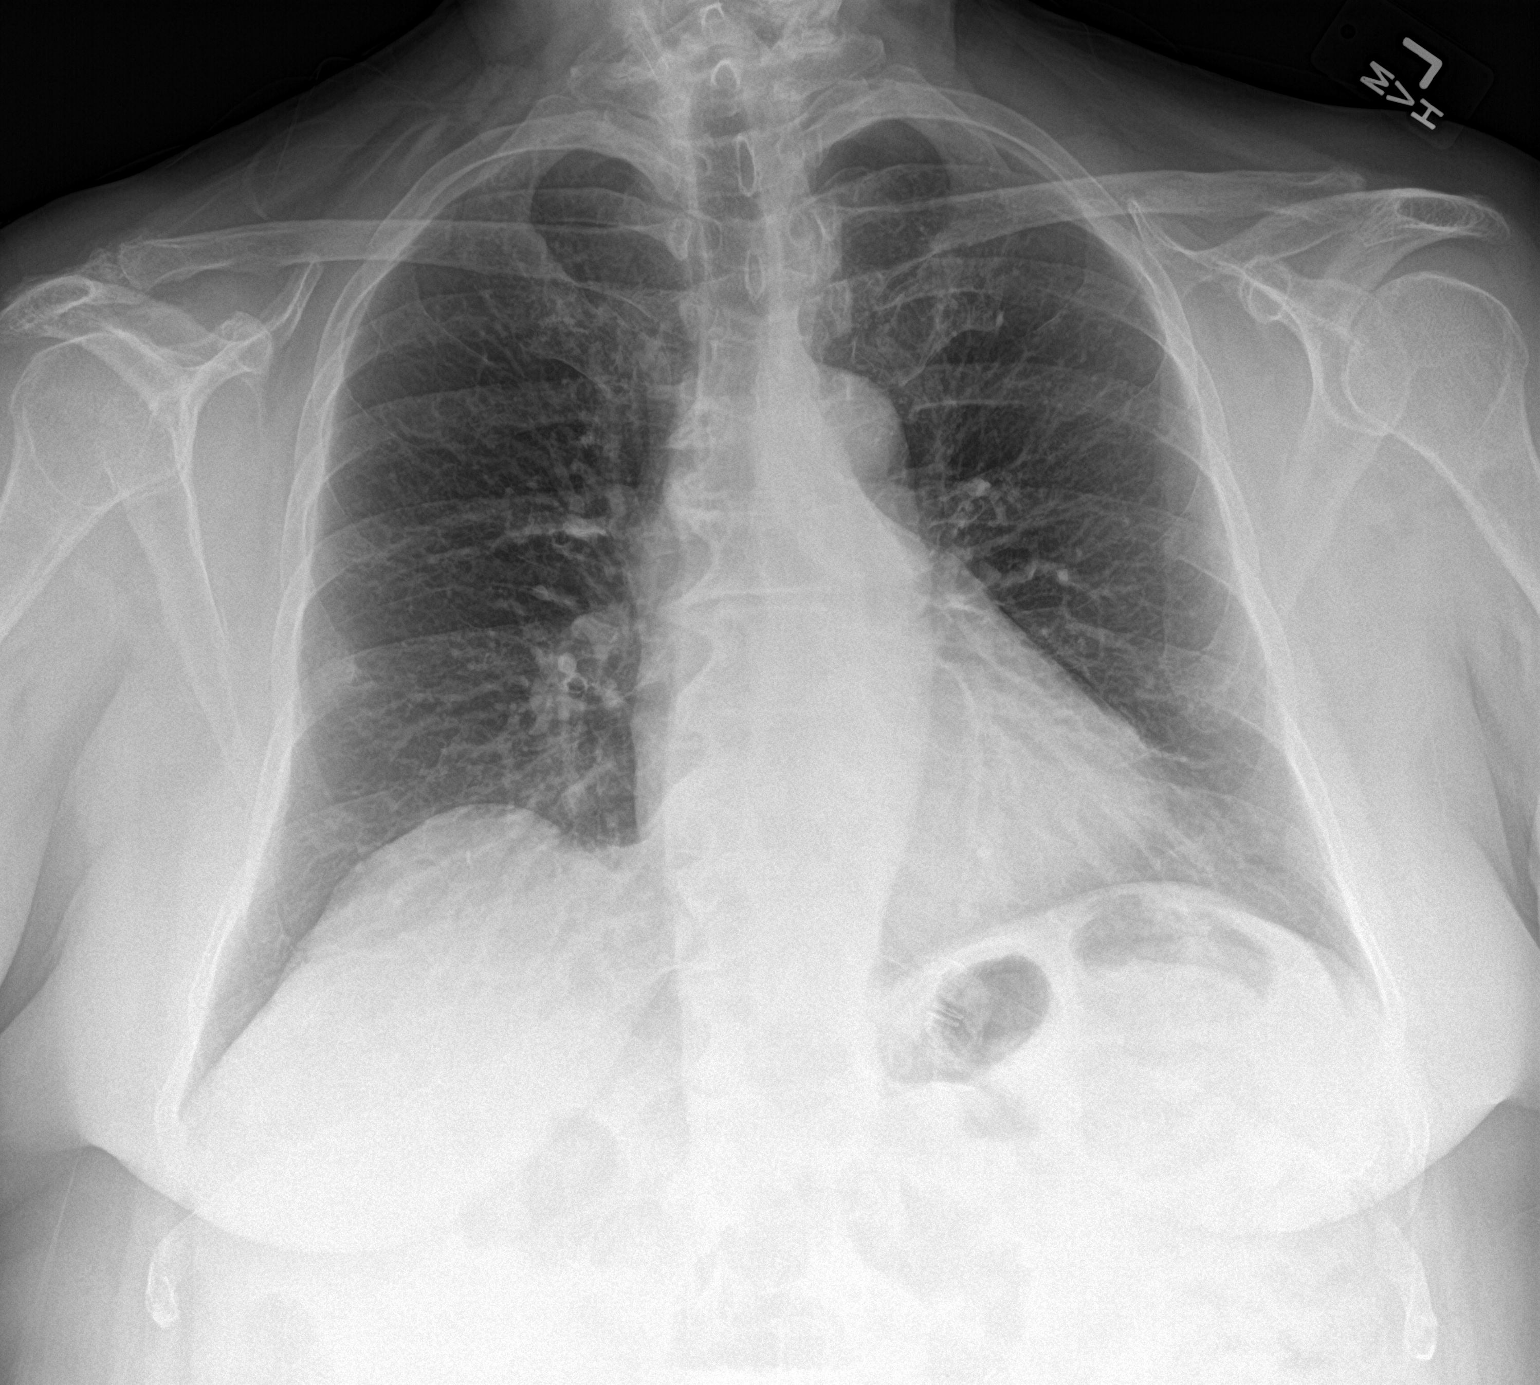

[chest lat]
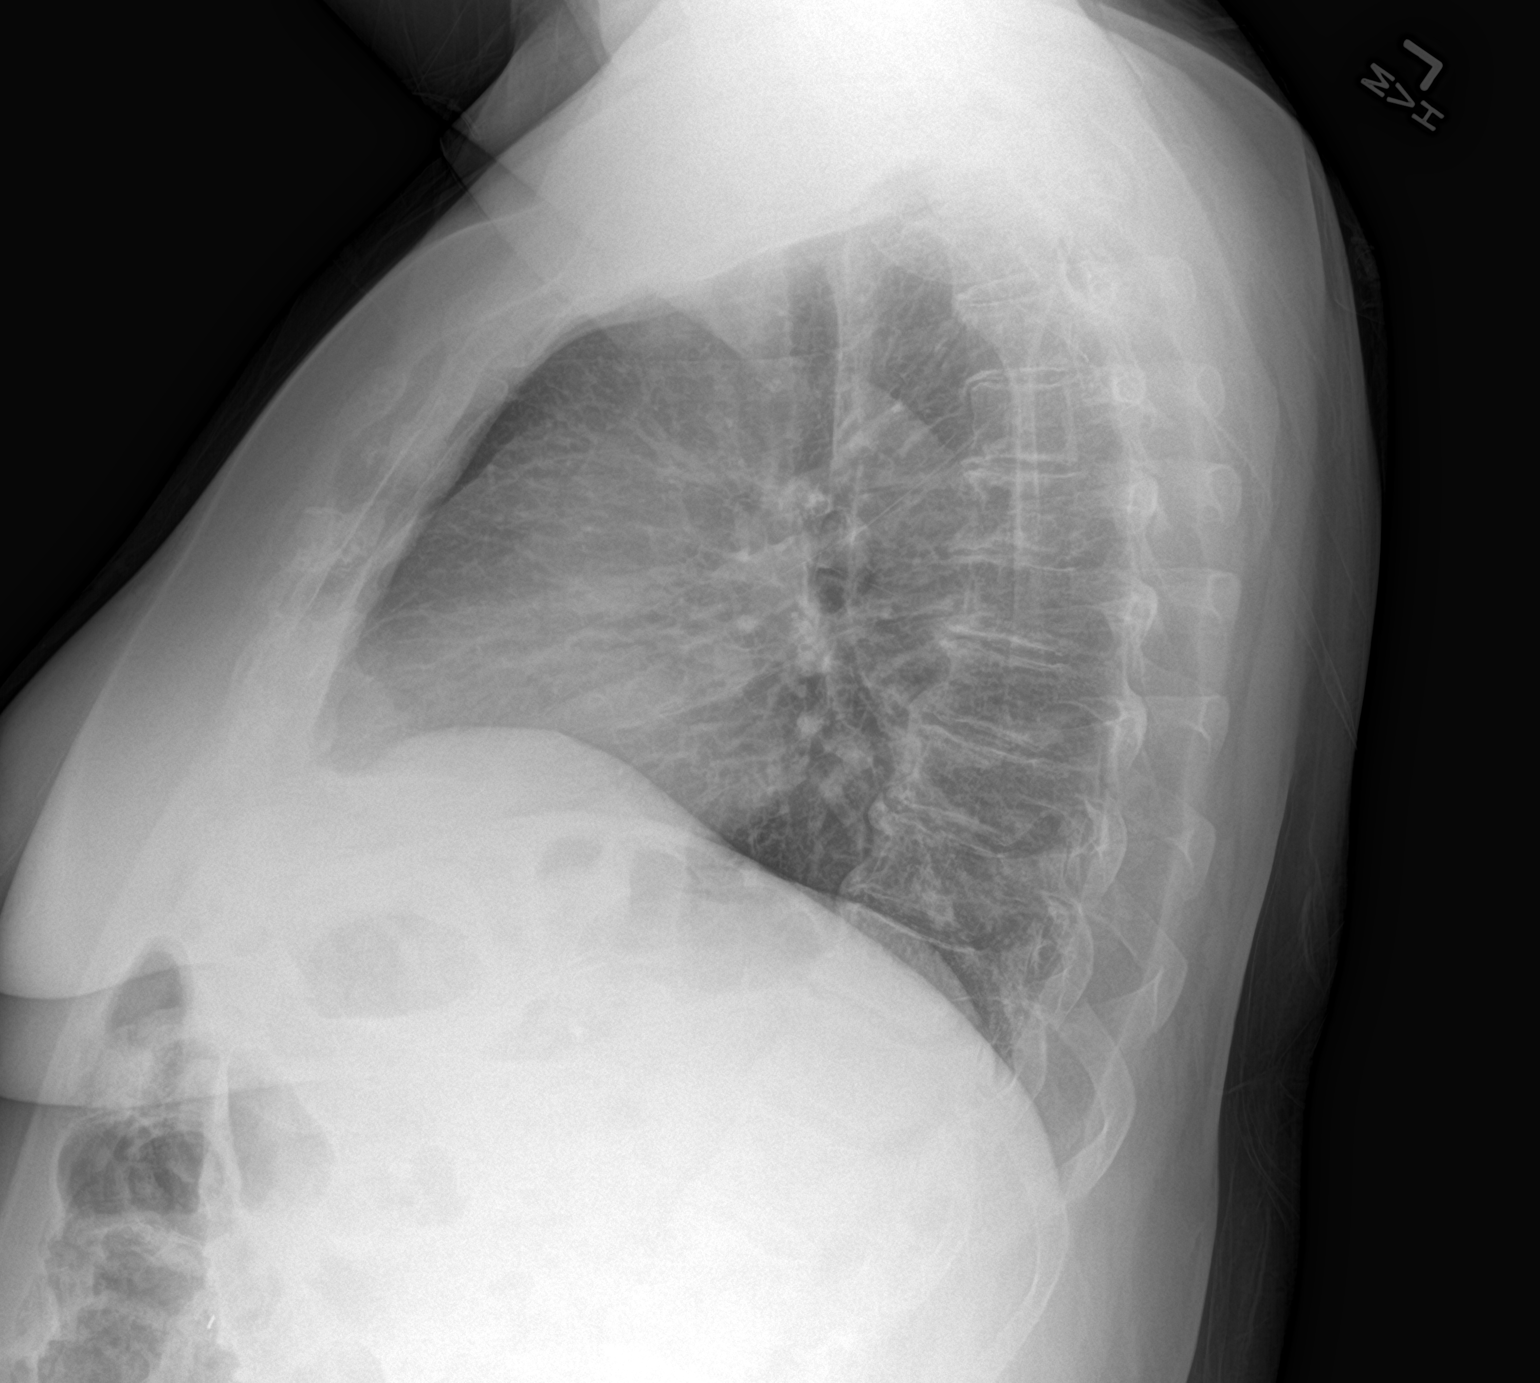

[2 of 2 positions shown; findings below may reference images not displayed]

FINDINGS: The lungs are clear wiithout focal pneumonia, edema, pneumothorax or
pleural effusion. Subtle focal area of pleural thickening right mid
lung is unchanged and may be related to old rib trauma. The
cardiopericardial silhouette is within normal limits for size. The
visualized bony structures of the thorax are intact.
IMPRESSION: No active cardiopulmonary disease.

## 2016-06-18 MED ORDER — TECHNETIUM TC 99M DIETHYLENETRIAME-PENTAACETIC ACID: 33.2000 | Freq: Once | INTRAVENOUS | Status: DC | PRN

## 2016-06-18 MED ORDER — TECHNETIUM TO 99M ALBUMIN AGGREGATED
4.3900 | Freq: Once | INTRAVENOUS | Status: AC | PRN
  Administered 2016-06-18: 4.39 via INTRAVENOUS

## 2016-06-19 ENCOUNTER — Ambulatory Visit (HOSPITAL_COMMUNITY)
Admission: RE | Admit: 2016-06-19 | Discharge: 2016-06-19 | Disposition: A | Discharge status: Home / Self Care | Payer: Medicare Other | Source: Ambulatory Visit | Attending: Emergency Medicine | Admitting: Emergency Medicine

## 2016-06-19 DIAGNOSIS — IMO0002 Reserved for concepts with insufficient information to code with codable children: Secondary | ICD-10-CM

## 2016-06-24 ENCOUNTER — Ambulatory Visit (HOSPITAL_COMMUNITY)
Admission: RE | Admit: 2016-06-24 | Discharge: 2016-06-24 | Disposition: A | Discharge status: Home / Self Care | Payer: Medicare Other | Source: Ambulatory Visit | Attending: Emergency Medicine | Admitting: Emergency Medicine

## 2016-06-24 ENCOUNTER — Encounter (INDEPENDENT_AMBULATORY_CARE_PROVIDER_SITE_OTHER): Payer: Self-pay

## 2016-06-24 DIAGNOSIS — I272 Other secondary pulmonary hypertension: Secondary | ICD-10-CM | POA: Insufficient documentation

## 2016-06-24 LAB — PULMONARY FUNCTION TEST
DL/VA % PRED: 110 %
DL/VA: 5.54 ml/min/mmHg/L
DLCO UNC % PRED: 94 %
DLCO UNC: 25.36 ml/min/mmHg
FEF 25-75 POST: 3.17 L/s
FEF 25-75 PRE: 3.16 L/s
FEF2575-%Change-Post: 0 %
FEF2575-%PRED-POST: 152 %
FEF2575-%PRED-PRE: 152 %
FEV1-%Change-Post: 1 %
FEV1-%Pred-Post: 98 %
FEV1-%Pred-Pre: 97 %
FEV1-Post: 2.46 L
FEV1-Pre: 2.43 L
FEV1FVC-%CHANGE-POST: 4 %
FEV1FVC-%Pred-Pre: 110 %
FEV6-%CHANGE-POST: -3 %
FEV6-%PRED-POST: 89 %
FEV6-%PRED-PRE: 92 %
FEV6-POST: 2.8 L
FEV6-PRE: 2.89 L
FEV6FVC-%CHANGE-POST: 0 %
FEV6FVC-%Pred-Post: 103 %
FEV6FVC-%Pred-Pre: 103 %
FVC-%Change-Post: -3 %
FVC-%PRED-POST: 85 %
FVC-%Pred-Pre: 88 %
FVC-Post: 2.81 L
FVC-Pre: 2.9 L
Post FEV1/FVC ratio: 88 %
Post FEV6/FVC ratio: 100 %
Pre FEV1/FVC ratio: 84 %
Pre FEV6/FVC Ratio: 100 %
RV % pred: 95 %
RV: 2.17 L
TLC % PRED: 99 %
TLC: 5.31 L

## 2016-06-24 MED ORDER — ALBUTEROL SULFATE (2.5 MG/3ML) 0.083% IN NEBU
2.5000 mg | INHALATION_SOLUTION | Freq: Once | RESPIRATORY_TRACT | Status: AC
  Administered 2016-06-24: 2.5 mg via RESPIRATORY_TRACT

## 2016-06-25 ENCOUNTER — Other Ambulatory Visit: Payer: Self-pay | Admitting: Neurology

## 2016-06-27 DIAGNOSIS — S29012A Strain of muscle and tendon of back wall of thorax, initial encounter: Secondary | ICD-10-CM | POA: Diagnosis not present

## 2016-06-27 DIAGNOSIS — M5137 Other intervertebral disc degeneration, lumbosacral region: Secondary | ICD-10-CM | POA: Diagnosis not present

## 2016-06-27 DIAGNOSIS — M4317 Spondylolisthesis, lumbosacral region: Secondary | ICD-10-CM | POA: Diagnosis not present

## 2016-06-27 DIAGNOSIS — M9901 Segmental and somatic dysfunction of cervical region: Secondary | ICD-10-CM | POA: Diagnosis not present

## 2016-06-27 DIAGNOSIS — M9902 Segmental and somatic dysfunction of thoracic region: Secondary | ICD-10-CM | POA: Diagnosis not present

## 2016-06-27 DIAGNOSIS — M9903 Segmental and somatic dysfunction of lumbar region: Secondary | ICD-10-CM | POA: Diagnosis not present

## 2016-06-27 DIAGNOSIS — R51 Headache: Secondary | ICD-10-CM | POA: Diagnosis not present

## 2016-07-02 DIAGNOSIS — E2749 Other adrenocortical insufficiency: Secondary | ICD-10-CM | POA: Diagnosis not present

## 2016-07-02 DIAGNOSIS — E038 Other specified hypothyroidism: Secondary | ICD-10-CM | POA: Diagnosis not present

## 2016-07-02 DIAGNOSIS — E2839 Other primary ovarian failure: Secondary | ICD-10-CM | POA: Diagnosis not present

## 2016-07-02 DIAGNOSIS — E663 Overweight: Secondary | ICD-10-CM | POA: Diagnosis not present

## 2016-07-03 DIAGNOSIS — M9902 Segmental and somatic dysfunction of thoracic region: Secondary | ICD-10-CM | POA: Diagnosis not present

## 2016-07-03 DIAGNOSIS — R51 Headache: Secondary | ICD-10-CM | POA: Diagnosis not present

## 2016-07-03 DIAGNOSIS — M5137 Other intervertebral disc degeneration, lumbosacral region: Secondary | ICD-10-CM | POA: Diagnosis not present

## 2016-07-03 DIAGNOSIS — S29012A Strain of muscle and tendon of back wall of thorax, initial encounter: Secondary | ICD-10-CM | POA: Diagnosis not present

## 2016-07-03 DIAGNOSIS — M4317 Spondylolisthesis, lumbosacral region: Secondary | ICD-10-CM | POA: Diagnosis not present

## 2016-07-03 DIAGNOSIS — M9901 Segmental and somatic dysfunction of cervical region: Secondary | ICD-10-CM | POA: Diagnosis not present

## 2016-07-03 DIAGNOSIS — M9903 Segmental and somatic dysfunction of lumbar region: Secondary | ICD-10-CM | POA: Diagnosis not present

## 2016-07-10 DIAGNOSIS — D1800 Hemangioma unspecified site: Secondary | ICD-10-CM | POA: Diagnosis not present

## 2016-07-10 DIAGNOSIS — L719 Rosacea, unspecified: Secondary | ICD-10-CM | POA: Diagnosis not present

## 2016-07-10 DIAGNOSIS — L906 Striae atrophicae: Secondary | ICD-10-CM | POA: Diagnosis not present

## 2016-07-10 DIAGNOSIS — L821 Other seborrheic keratosis: Secondary | ICD-10-CM | POA: Diagnosis not present

## 2016-07-10 DIAGNOSIS — Z808 Family history of malignant neoplasm of other organs or systems: Secondary | ICD-10-CM | POA: Diagnosis not present

## 2016-07-10 DIAGNOSIS — L309 Dermatitis, unspecified: Secondary | ICD-10-CM | POA: Diagnosis not present

## 2016-07-10 DIAGNOSIS — D225 Melanocytic nevi of trunk: Secondary | ICD-10-CM | POA: Diagnosis not present

## 2016-07-17 ENCOUNTER — Ambulatory Visit (INDEPENDENT_AMBULATORY_CARE_PROVIDER_SITE_OTHER): Payer: Medicare Other | Admitting: Neurology

## 2016-07-17 ENCOUNTER — Encounter: Payer: Self-pay | Admitting: Neurology

## 2016-07-17 VITALS — BP 100/63 | HR 55 | Ht 66.0 in | Wt 226.0 lb

## 2016-07-17 DIAGNOSIS — G243 Spasmodic torticollis: Secondary | ICD-10-CM | POA: Diagnosis not present

## 2016-07-17 DIAGNOSIS — R1115 Cyclical vomiting syndrome unrelated to migraine: Secondary | ICD-10-CM

## 2016-07-17 DIAGNOSIS — G43A0 Cyclical vomiting, not intractable: Secondary | ICD-10-CM | POA: Diagnosis not present

## 2016-07-17 DIAGNOSIS — I63412 Cerebral infarction due to embolism of left middle cerebral artery: Secondary | ICD-10-CM | POA: Diagnosis not present

## 2016-07-17 DIAGNOSIS — G43001 Migraine without aura, not intractable, with status migrainosus: Secondary | ICD-10-CM

## 2016-07-17 DIAGNOSIS — I639 Cerebral infarction, unspecified: Secondary | ICD-10-CM | POA: Diagnosis not present

## 2016-07-17 MED ORDER — LYRICA 100 MG PO CAPS: 100.0000 mg | ORAL_CAPSULE | Freq: Two times a day (BID) | ORAL | 5 refills | Status: DC

## 2016-07-17 MED ORDER — ONDANSETRON 4 MG PO TBDP: 4.0000 mg | ORAL_TABLET | Freq: Three times a day (TID) | ORAL | 11 refills | Status: DC | PRN

## 2016-07-17 MED ORDER — BACLOFEN 10 MG PO TABS: 10.0000 mg | ORAL_TABLET | Freq: Three times a day (TID) | ORAL | 12 refills | Status: DC

## 2016-07-17 MED ORDER — TOPIRAMATE 100 MG PO TABS: 200.0000 mg | ORAL_TABLET | Freq: Every day | ORAL | 11 refills | Status: DC

## 2016-07-17 NOTE — Progress Notes (Signed)
[name]    Provider:  Dr [name] Referring Provider: [name], MD Primary Care Physician:  [name], MD  CC: Chronic migraines, cervical dystonia, occipital neuralgia  Interval history:  Complete stroke up was unrevealing. MRI showed Two small wedge-shaped foci in the left hemisphere consistent with prior small ischemic strokes. As both involve the gray matter and juxtacortical white matter, consider an embolic etiology. Echo showed pulmonary HTN and she has been referred to pulmonology. stroke workup including carotid dopplers, 30-day heart monitor, echo, mra of the brain was unrevealing, she needs evaluation for loop recorder will refer to Dr. Joylene Grapes.Started daily asa 81mg  and zetia for HLD (cannot take statins). Will ask if Dr. Rayann Heman can request the new PCSK9 inhibitor, need cardiologist to get approved. Discussed with patient today, she is open to a loop recorder. She continues to have migraines and she has had 2 botox for migraine injections. She also has cervical dystonia and today we will perform botox for cervical dystonia.  Interval history 03/17/2016: She is on Topiramate. Still having migraines. She has 20 headaches a month or more and 9 are migrainous. Pain on the right side of the head. Light sensitivity, sound sensitivity, nausea no vomiting. Pounding, throbbing. On Topamax for migraines helping but still with significant headaches. She needs to follow up with cardiology for possible loop recorder. She sees Daneen Schick for cardiology will refer back to him for loop recorder. Migraines have not improved. Discussed stroke, seeing pulmonary doctor for new dx of pulmonary HTN. Appears she had testing for RA which was neagtive.   Also significant pain in one muscle of the right side of the neck. This has been going on for years without relief and is slowly progressively getting worse.  She has significant pain, decreased ROM which is chronic for over one year. She  gets regular massage. She's been to physical therapy. We have performed multiple trigger point injections with Depo-Medrol, lidocaine and Marcaine. She has tried baclofen, Cymbalta, oxycodone, Lyrica, gabapentin, Robaxin without any relief.   She ls also having pain radiating from the right side of the neck to the occipital area. Difficult to differentiate from migraines but appears to be different.   Medications tried include; Topamax, b-blocker,multiple occipital and cervical trigger point injections with Depo-Medrol, lidocaine and Marcaine. She has tried baclofen, Cymbalta, oxycodone, Lyrica, gabapentin, Robaxin without any relief.    Interval history 02/28/2016; Patient returns to discuss MRI of the brain with possible embolic strokes. Discussed with patient and husband. Reviewed all images with patient and husband, explained image results. Discussed strokes, types, evaluation. Will perform a stroke workup including carotid dopplers, 30-day heart monitor, echo, mra of the brain.Started daily asa 81mg  and zetia for HLD (cannot take statins). May need a loop recorder.    IMPRESSION: This MRI of the brain with and without contrast shows the following: 1. Two small wedge-shaped foci in the left hemisphere consistent with prior small ischemic strokes. As both involve the gray matter and juxtacortical white matter, consider an embolic etiology 2. Chronic microvascular ischemic change elsewhere, typical extent for age. 3. Cortical atrophy, more than expected for age. 4. There is a normal enhancement pattern and there are no acute findings.  Original HPI: [name] is a 70 y.o. female here for evaluation of headaches as a second opinion from Dr. Brett Fairy. She has had neck pain for 7-8 months. Startes in the muscle and radiates up and to the back right of the head. She has had  massage and they work hard on it and it helps. She has pain in the back of the head. She has been told it is  muscular. Pain on movement of the head. She went to an ENT. Can't figure what it is. She put voltaren gel it helps. Heat and stretching helps. She went to physical therapy. She has decreased ROM to the right.   Headaches worsening the last 6 months. She reports Vision changes with migraines. She has migraines. No aura. Started when she was a teenager, she has always had them. No FHx. Headaches start on the right. More throbbing pain on the bilateral temples and the back of the right head. The painful neck exacerbates it. She has nausea. She has had vomiting. She takes phenergan. She has to go into a dark room, sounds bothers her. She is having them daily for the last 6 months. Before then better controlled. She has 30 headache days a month, 15 migrainous a month. They can last all day long. No medication overuse. She has pain all over, leg pain, knee pain and she is managed by a pain clinic and uses oxycodone. She was on Topamax in the past. Tried topamax in the past. She has failed amitriptyline. Tried Toradol, tried imitrex, tried cymbalta.   Reviewed outside physician, imaging and lab reports:  Bmp with slightly dec gfr 54, creat 1.04 03/2015  10/2015: CT soft tissue of the neck 10/2015 personally reviewe dimages: Skeleton: Cervical disc and facet degeneration. Mild anterior slip C3-4, C4-5, and C5-6 due to disc and facet degeneration. No fracture or mass lesion.  Upper chest: Lung apices are clear.  IMPRESSION: Negative for mass or adenopathy in the neck. No acute abnormality  Review of Systems: Patient complains of symptoms per HPI as well as the following symptoms: murmur, swelling in legs, joint pain. Pertinent negatives per HPI. All others negative.    Social History   Social History  . Marital status: Married    Spouse name: Pilar Jarvis  . Number of children: 3  . Years of education: college   Occupational History  . Retired    Social History Main Topics  .  Smoking status: Never Smoker  . Smokeless tobacco: Never Used  . Alcohol use No  . Drug use: No  . Sexual activity: Yes    Birth control/ protection: Post-menopausal   Other Topics Concern  . Not on file   Social History Narrative   Caffeine 8-10 cups daily.  (coffee 1 cup am, unsw tea all day long).     Family History  Problem Relation Age of Onset  . CAD Mother   . Hypertension Mother   . Neuropathy Mother   . Osteoarthritis Mother   . Heart disease Mother   . Breast cancer Maternal Grandmother   . CAD Paternal Grandfather   . Colon cancer Father     Past Medical History:  Diagnosis Date  . 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 Arthritis    generalized  . Asthma   . Atrophic vaginitis   . Back pain    DDD/stenosis  . Carotid stenosis    Carotid US 10/16: Plaque RICA (123456), normal LICA  . Depression    takes Cymbalta daily  . Diverticulosis    benign  . DJD (degenerative joint disease)   . Dyslipidemia   . Dysrhythmia   . Family history of GI bleeding   . GERD (gastroesophageal reflux disease)  takes Nexium daily  . Gestational diabetes   . H/O hiatal hernia    surgery for hernia  . Headache(784.0)    takes Imitrex daily as needed and Bisoprolol daily;last migraine was about 2wks ago  . History of bronchitis 1 yr ago  . History of shingles   . Insomnia    takes Ambien nightly  . Joint pain   . Joint swelling   . Leg cramps    takes Flexeril daily as needed  . 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 Nocturia   . Osteoporosis   . Peripheral neuropathy (HCC)    takes Gabapentin daily  . Pneumonia 62yrs ago   hx of  . Restless leg syndrome    takes Requip daily  . RLS (restless legs syndrome) 08/16/2015  . Sleep apnea    uses CPAP  . Tubular adenoma of colon   . Vertigo   . Vitamin D deficiency     Past Surgical History:  Procedure Laterality Date  . COLONOSCOPY    . ESOPHAGOGASTRODUODENOSCOPY    . excess skin removal      post weight loss  . GASTRIC BYPASS  2001  . HERNIA REPAIR     umbilical  . JOINT REPLACEMENT Right    knee  . skin reduction  2003 2004   stomach and arm  . STEROID INJECTION TO SCAR     x 2 in back   . TOTAL KNEE ARTHROPLASTY Left 04/03/2015   Procedure: LEFT TOTAL KNEE ARTHROPLASTY;  Surgeon: Melrose Nakayama, MD;  Location: [facility];  Service: Orthopedics;  Laterality: Left;  . WISDOM TOOTH EXTRACTION      Current Outpatient Prescriptions  Medication Sig Dispense Refill  . amphetamine-dextroamphetamine (ADDERALL) 10 MG tablet Take 10 mg by mouth daily with breakfast.   0  . baclofen (LIORESAL) 10 MG tablet Take 1 tablet (10 mg total) by mouth 3 (three) times daily. 90 each 12  . bisoprolol (ZEBETA) 10 MG tablet Take 10 mg by mouth 2 (two) times daily.    . calcium carbonate (OS-CAL) 1250 (500 CA) MG chewable tablet Chew 1 tablet by mouth daily. Not sure of dosage    . diclofenac sodium (VOLTAREN) 1 % GEL Apply 2 g topically 4 (four) times daily. 100 g 12  . DULoxetine (CYMBALTA) 30 MG capsule Take 30 mg by mouth 2 (two) times daily.     Marland Kitchen esomeprazole (NEXIUM) 40 MG capsule Take 40 mg by mouth 2 (two) times daily before a meal.   6  . furosemide (LASIX) 20 MG tablet Take 1 tablet (20 mg total) by mouth 2 (two) times daily. 60 tablet 11  . Loratadine (CLARITIN) 10 MG CAPS Take 1 capsule by mouth as needed.    Marland Kitchen LYRICA 100 MG capsule Take 1 tablet by mouth 2 (two) times daily.  5  . ondansetron (ZOFRAN ODT) 4 MG disintegrating tablet Take 1 tablet (4 mg total) by mouth every 8 (eight) hours as needed for nausea or vomiting. 45 tablet 11  . PROAIR HFA 108 (90 Base) MCG/ACT inhaler INL 2 PFS PO Q 4 H PRN  5  . quiNINE (QUALAQUIN) 324 MG capsule TAKE 1 CAPSULE AFTER LUNCH AND AFTER DINNER. 60 capsule 6  . rOPINIRole (REQUIP) 1 MG tablet TAKE 1 TABLET BY MOUTH AT BEDTIME AS NEEDED. MAY REPEAT DOSE 1 TIME AS NEEDED 180 tablet 0  . topiramate (TOPAMAX) 100 MG tablet Take 2 tablets (200 mg total)  by mouth at bedtime. [place]  tablet 11  . Vitamin D, Ergocalciferol, (DRISDOL) 50000 units CAPS capsule Take 1 capsule (50,000 Units total) by mouth every 7 (seven) days. 12 capsule 1  . zolpidem (AMBIEN) 10 MG tablet Take 10 mg by mouth at bedtime.   4   No current facility-administered medications for this visit.     Allergies as of 07/17/2016  . (No Known Allergies)    Vitals: BP 100/63 (BP Location: Right Wrist, Patient Position: Sitting, Cuff Size: Normal)   Pulse (!) 55   Ht 5\' 6"  (1.676 m)   Wt 226 lb (102.5 kg)   BMI 36.48 kg/m  Last Weight:  Wt Readings from Last 1 Encounters:  07/17/16 226 lb (102.5 kg)   Last Height:   Ht Readings from Last 1 Encounters:  07/17/16 5\' 6"  (1.676 m)     Physical exam: Exam: Gen: NAD, conversant, well nourised, obese, well groomed  CV: RRR, no MRG. No Carotid Bruits. No peripheral edema, warm, nontender Eyes: Conjunctivae clear without exudates or hemorrhage MSK: Elevated right shoulder, right laterocollis and right torticollis, hypertrophied right trapezius, tender and hypertrophied semispinalis capitus muscle.   Neuro: Detailed Neurologic Exam  Speech:  Speech is normal; fluent and spontaneous with normal comprehension.  Cognition:  The patient is oriented to person, place, and time;   recent and remote memory intact;   language fluent;   normal attention, concentration,   fund of knowledge Cranial Nerves:  The pupils are equal, round, and reactive to light. The fundi are normal. Visual fields are full to finger confrontation. Extraocular movements are intact. Trigeminal sensation is intact and the muscles of mastication are normal. The face is symmetric. The palate elevates in the midline. Hearing intact. Voice is normal. Shoulder shrug is normal. The tongue has normal motion without fasciculations.    Assessment/Plan: 70 year old female with chronic migraines, embolic looking strokes  on MRI of the brain  Migraine: ContinueTopiramate for migraine and promethazine for nausea and vomiting during migraine. Botox for migraine, has had two injections.   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 She has significant pain, decreased ROM which is chronic for over one year. She gets regular massage. She's been to physical therapy. We have performed multiple trigger point injections with Depo-Medrol, lidocaine and Marcaine. She has tried baclofen, Cymbalta, oxycodone, Lyrica, gabapentin, Robaxin without any relief. Patient would benefit from Botox therapy for her decreased range of motion, progressive continued neck pain. We'll request approval for cervical dystonia injections and see if Drs. Read Drivers or Naaman Plummer can continue this.  Performed 200 unit of botox: 50 right levator scapula, 100 right trapezius, 50 semispinalis capitus today. See procedure note below.   Remote stroke:  stroke workup including carotid dopplers, 30-day heart monitor, echo, mra of the brain was unrevealing, she needs evaluation for loop recorder will refer to Dr. Joylene Grapes.Started daily asa 81mg  and zetia for HLD (cannot take statins). Will ask if Dr. Rayann Heman can request the new PCSK9 inhibitor, need cardiologist to get approved.  Occipital nerve pain: Also suspect she has occipital nerve pain. Also suspect she has some cervical dystonia on the right. Approval for additional botox for the cervical dystonia. Referral to dr dalton bethea for occipital c3 nerve branch blocks.   I had a long d/w patient about her recent stroke, risk for recurrent stroke/TIAs, personally independently reviewed imaging studies and stroke evaluation results and answered questions.Start ASA 81 for secondary stroke prevention and maintain strict control of hypertension with blood pressure  goal below 130/90, diabetes with hemoglobin A1c goal below 6.5% and lipids with LDL cholesterol goal below 70 mg/dL.Patient has h/o  statin intolerance hence recommend Zetia. I also advised the patient to eat a healthy diet with plenty of whole grains, cereals, fruits and vegetables, exercise regularly and maintain ideal body weight.   Botox procedure for cervical dystonia: These are patient's first injections  Response: These are her first injections  reviewed w/ pt the procedure of botulinum toxin, incl side effects - localized weakness, inj site rxn, myalgia and spread from site of injection   Procedure note   EMG: EMG guidance was used to inject muscles detailed below. Aseptic procedure was performed and patient tolerated procedure. Procedure was performed by Dr. Myrla Halsted    Refractory to oral medications   Units Injected 200, Units wasted 0, Units billed 200  Motrin / tylenol for injections site pain / soreness   REMS precautions handout given to patient   RTC - see instructions for details   Botox 200 units (100 units/2cc NS). Wasted 0Units.   j code botox type A J0585  100 units [id number] Expiration date 12/2018  [facility] [id number]   j code botox type A J0585 100 units [id number] Expiration date 04/2018 [facility] [id number]    50 right levator scapula, 100 right trapezius, 50 proximal semispinalis capitus  Sarina Ill, MD  [facility]  [address] [place]  [place], [place] [address]  Phone [phone] Fax [phone]    A total of 15 minutes was spent face-to-face with this patient. Over half this time was spent on counseling patient on the embolic stroke diagnosis and different diagnostic and therapeutic options available.

## 2016-07-30 ENCOUNTER — Telehealth: Payer: Self-pay | Admitting: Neurology

## 2016-07-30 NOTE — Telephone Encounter (Signed)
I have called the patient twice today with no answer. Left a VM asking her to return my call.

## 2016-08-01 ENCOUNTER — Encounter: Payer: Self-pay | Admitting: Internal Medicine

## 2016-08-01 ENCOUNTER — Ambulatory Visit (INDEPENDENT_AMBULATORY_CARE_PROVIDER_SITE_OTHER): Payer: Medicare Other | Admitting: Internal Medicine

## 2016-08-01 VITALS — BP 124/74 | HR 72 | Ht 66.0 in | Wt 233.0 lb

## 2016-08-01 DIAGNOSIS — I639 Cerebral infarction, unspecified: Secondary | ICD-10-CM | POA: Diagnosis not present

## 2016-08-01 DIAGNOSIS — I272 Other secondary pulmonary hypertension: Secondary | ICD-10-CM

## 2016-08-01 DIAGNOSIS — IMO0002 Reserved for concepts with insufficient information to code with codable children: Secondary | ICD-10-CM

## 2016-08-01 MED ORDER — DOXYCYCLINE HYCLATE 100 MG PO TABS: 100.0000 mg | ORAL_TABLET | Freq: Two times a day (BID) | ORAL | 0 refills | Status: DC

## 2016-08-01 MED ORDER — SPIRONOLACTONE 25 MG PO TABS: 25.0000 mg | ORAL_TABLET | Freq: Two times a day (BID) | ORAL | 11 refills | Status: DC

## 2016-08-01 NOTE — Patient Instructions (Addendum)
doxycyline 100 mg twice before meals  X 10 days   Elevate legs  Add aldactone 25 mg twice daily for now and continue lasix just as you were previously instructed   Keep appt to see Dr Lamonte Sakai 08/04/16 - go to ER in meantime if condition worsens

## 2016-08-01 NOTE — Progress Notes (Signed)
Subjective:    Patient ID: [name], female    DOB: Mar 08, 1946, 70 y.o.   MRN: [id number]  HPI 06/11/16 Dr Lamonte Sakai   never smoker with a history of gastric bypass surgery, obstructive sleep apnea on CPAP, restless leg syndrome, migraines, B-12 and iron deficiency, history of CVA (MRI 02/24/16).  Underwent monitor for 30 days, no A fib seen . Seen by Dr Tamala Julian, cath in 2010, reassuring myoview in 2015. Also possible history of asthma per her office notes.  Diagnosis was made clinically, sometimes uses albuterol prn. When she has trouble she develops bronchitis sx, cough, dyspnea, wheeze. No PFT. A TTE was done April 2016 that showed normal LV and RV fxn but an estimated PASP of 71mmHg.   She was on diet meds in the past > Fen-Phen for about about a year back in the 90's.   Exertional SOB, occurs with walking up hill.  Has some increased LE edema Her weight has been stable for several years Denies any history of autoimmune disease or any autoimmune disease in her family rec check lab work   06/11/16 neg Rheum w/u  c heck a ventilation-perfusion scan + CXR> 06/18/16  nl x Reversal of tracer gradient within the lungs consistent with pulmonary arterial hypertension. Walking oximetry 06/11/16 > nl  We will perform full pulmonary function testing> done 7/11/7 and nl including dlco     08/01/2016 acute ext ov/Nestor Wieneke re: refractory leg swelling  Chief Complaint  Patient presents with  . Acute Visit    RB pt c/o increased SOB, R sided shoulder and back pain, b/l LE edema worse on R side accompanied with rash X1-2 days.  No new med starts.   leg swelling x months and joint stiff ness but rheum w/u neg  new rash both lower ext x 2 days burning =  Bilaterally Some fever/ chills / cough declines cxr "I've had enough xrays and tests"   No obvious day to day or daytime variability or assoc excess/ purulent sputum or mucus plugs or hemoptysis or cp or chest tightness, subjective wheeze or overt sinus or hb  symptoms. No unusual exp hx or h/o childhood pna/ asthma or knowledge of premature birth.  Sleeping ok without nocturnal  or early am exacerbation  of respiratory  c/o's or need for noct saba. Also denies any obvious fluctuation of symptoms with weather or environmental changes or other aggravating or alleviating factors except as outlined above   Current Medications, Allergies, Complete Past Medical History, Past Surgical History, Family History, and Social History were reviewed in [employer] record.  ROS  The following are not active complaints unless bolded sore throat, dysphagia, dental problems, itching, sneezing,  nasal congestion or excess/ purulent secretions, ear ache,   fever, chills, sweats, unintended wt loss, classically pleuritic or exertional cp,  orthopnea pnd or leg swelling, presyncope, palpitations, abdominal pain, anorexia, nausea, vomiting, diarrhea  or change in bowel or bladder habits, change in stools or urine, dysuria,hematuria,  rash, arthralgias, visual complaints, headache, numbness, weakness or ataxia or problems with walking or coordination,  change in mood/affect or memory.                Past Medical History  Diagnosis Date  . Sleep apnea     uses CPAP  . H/O hiatal hernia     surgery for hernia  . Arthritis     generalized  . Leg cramps     takes Flexeril daily as  needed  . Restless leg syndrome     takes Requip daily  . Vitamin D deficiency   . Osteoporosis   . Tubular adenoma of colon   . Gestational diabetes   . Diverticulosis     benign  . Dyslipidemia   . Atrophic vaginitis   . DJD (degenerative joint disease)   . Vertigo   . 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 Depression     takes Cymbalta daily  . Insomnia     takes Ambien nightly  . GERD (gastroesophageal reflux disease)     takes Nexium daily  . Dysrhythmia   . Asthma   . Pneumonia 75yrs ago    hx of  . History of bronchitis 1 yr ago  . Headache(784.0)      takes Imitrex daily as needed and Bisoprolol daily;last migraine was about 2wks ago  . Peripheral neuropathy (HCC)     takes Gabapentin daily  . Joint pain   . Joint swelling   . Back pain     DDD/stenosis  . Family history of GI bleeding   . Nocturia   . 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 History of shingles   . RLS (restless legs syndrome) 08/16/2015  . Carotid stenosis     Carotid US 10/16: Plaque RICA (123456), normal LICA     Family History  Problem Relation Age of Onset  . CAD Mother   . Hypertension Mother   . Neuropathy Mother   . Osteoarthritis Mother   . Heart disease Mother   . Breast cancer Maternal Grandmother   . CAD Paternal Grandfather   . Colon cancer Father      Social History   Social History  . Marital Status: Married    Spouse Name: Pilar Jarvis  . Number of Children: 3  . Years of Education: college   Occupational History  . Retired    Social History Main Topics  . Smoking status: Never Smoker   . Smokeless tobacco: Not on file  . Alcohol Use: No  . Drug Use: No  . Sexual Activity: Yes    Birth Control/ Protection: Post-menopausal   Other Topics Concern  . Not on file   Social History Narrative   Caffeine 8-10 cups daily.  (coffee 1 cup am, unsw tea all day long).      No Known Allergies   Outpatient Prescriptions Prior to Visit  Medication Sig Dispense Refill  . amphetamine-dextroamphetamine (ADDERALL) 10 MG tablet Take 10 mg by mouth daily with breakfast.   0  . baclofen (LIORESAL) 10 MG tablet Take 1 tablet (10 mg total) by mouth 3 (three) times daily. 90 each 12  . bisoprolol (ZEBETA) 10 MG tablet Take 10 mg by mouth 2 (two) times daily.    . calcium carbonate (OS-CAL) 1250 (500 CA) MG chewable tablet Chew 1 tablet by mouth daily. Not sure of dosage    . diclofenac sodium (VOLTAREN) 1 % GEL Apply 2 g topically 4 (four) times daily. 100 g 12  . DULoxetine (CYMBALTA) 30 MG capsule Take 30 mg by mouth 2 (two)  times daily.     Marland Kitchen esomeprazole (NEXIUM) 40 MG capsule Take 40 mg by mouth 2 (two) times daily before a meal.   6  . ezetimibe (ZETIA) 10 MG tablet Take 1 tablet (10 mg total) by mouth daily. 30 tablet 11  . furosemide (LASIX) 20 MG tablet Take 1 tablet (20 mg  total) by mouth 2 (two) times daily. 60 tablet 11  . Loratadine (CLARITIN) 10 MG CAPS Take 1 capsule by mouth as needed.    Marland Kitchen LYRICA 100 MG capsule Take 1 tablet by mouth 2 (two) times daily.  5  . ondansetron (ZOFRAN ODT) 4 MG disintegrating tablet Take 1 tablet (4 mg total) by mouth every 8 (eight) hours as needed for nausea or vomiting. 45 tablet 11  . PROAIR HFA 108 (90 Base) MCG/ACT inhaler INL 2 PFS PO Q 4 H PRN  5  . quiNINE (QUALAQUIN) 324 MG capsule TAKE 1 CAPSULE AFTER LUNCH AND AFTER DINNER. 60 capsule 6  . rOPINIRole (REQUIP) 1 MG tablet TAKE 1 TABLET BY MOUTH AT BEDTIME AS NEEDED. MAY REPEAT DOSE 1 TIME AS NEEDED 180 tablet 0  . topiramate (TOPAMAX) 100 MG tablet Take 2 tablets (200 mg total) by mouth at bedtime. 60 tablet 11  . Vitamin D, Ergocalciferol, (DRISDOL) 50000 units CAPS capsule Take 1 capsule (50,000 Units total) by mouth every 7 (seven) days. 12 capsule 1  . zolpidem (AMBIEN) 10 MG tablet Take 10 mg by mouth at bedtime.   4   No facility-administered medications prior to visit.         Objective:   Physical Exam  Wt Readings from Last 3 Encounters:  08/01/16 233 lb (105.7 kg)  07/17/16 226 lb (102.5 kg)  06/11/16 233 lb (105.7 kg)    Vital signs reviewed     Gen: Pleasant, overweight woman , in no distress,  normal affect  ENT: No lesions,  mouth clear,  oropharynx clear, no postnasal drip  Neck: No JVD, no TMG, no carotid bruits  Lungs: No use of accessory muscles, clear without rales or rhonchi  Cardiovascular: RRR, heart sounds normal, 2/6 systolic M,   1+ pitting lower ext sym edema  Musculoskeletal: No deformities, no cyanosis or clubbing  Neuro: alert, non focal  Skin: stasis  dermatitis changes both LEs     Assessment & Plan:

## 2016-08-03 ENCOUNTER — Encounter: Payer: Self-pay | Admitting: Internal Medicine

## 2016-08-03 NOTE — Assessment & Plan Note (Signed)
check lab work   06/11/16 neg Rheum w/u  c heck a ventilation-perfusion scan + CXR> 06/18/16  nl x Reversal of tracer gradient within the lungs consistent with pulmonary arterial hypertension. Walking oximetry 06/11/16 > nl  We will perform full pulmonary function testing> done 7/11/7 and nl including dlco   Now with worsening edema refractory to lasix with venous stasis vs early cellulitis > rec course of doxy and add aldactone 25 mg and elevate, recheck 08/04/16 as already planned and titrate up ald if bun/creat and K allow as best way to reverse the hyperaldosteronism assoc with PH  I had an extended discussion with the patient reviewing all relevant studies completed to date and  lasting 15 to 20 minutes of a 25 minute visit    Each maintenance medication was reviewed in detail including most importantly the difference between maintenance and prns and under what circumstances the prns are to be triggered using an action plan format that is not reflected in the computer generated alphabetically organized AVS.    Please see instructions for details which were reviewed in writing and the patient given a copy highlighting the part that I personally wrote and discussed at today's ov.

## 2016-08-04 ENCOUNTER — Ambulatory Visit (INDEPENDENT_AMBULATORY_CARE_PROVIDER_SITE_OTHER): Payer: Medicare Other | Admitting: Emergency Medicine

## 2016-08-04 ENCOUNTER — Encounter: Payer: Self-pay | Admitting: Emergency Medicine

## 2016-08-04 DIAGNOSIS — J4520 Mild intermittent asthma, uncomplicated: Secondary | ICD-10-CM

## 2016-08-04 DIAGNOSIS — L0390 Cellulitis, unspecified: Secondary | ICD-10-CM | POA: Insufficient documentation

## 2016-08-04 DIAGNOSIS — I639 Cerebral infarction, unspecified: Secondary | ICD-10-CM

## 2016-08-04 DIAGNOSIS — G4733 Obstructive sleep apnea (adult) (pediatric): Secondary | ICD-10-CM

## 2016-08-04 DIAGNOSIS — L03119 Cellulitis of unspecified part of limb: Secondary | ICD-10-CM | POA: Diagnosis not present

## 2016-08-04 DIAGNOSIS — I272 Other secondary pulmonary hypertension: Secondary | ICD-10-CM | POA: Diagnosis not present

## 2016-08-04 DIAGNOSIS — Z9989 Dependence on other enabling machines and devices: Secondary | ICD-10-CM

## 2016-08-04 DIAGNOSIS — J45909 Unspecified asthma, uncomplicated: Secondary | ICD-10-CM | POA: Insufficient documentation

## 2016-08-04 DIAGNOSIS — IMO0002 Reserved for concepts with insufficient information to code with codable children: Secondary | ICD-10-CM

## 2016-08-04 NOTE — Assessment & Plan Note (Signed)
Her clinical history is quite compelling for mild intermittent asthma that appears to be trigger related. Her primary function testing is normal without bronchodilator response. She could still have occult asthma. I discussed possible methacholine challenge with her today. She would like to defer for now. We may revisit in the future.

## 2016-08-04 NOTE — Assessment & Plan Note (Signed)
Patient has had normal pulmonary function testing, normal VQ scan, unrevealing autoimmune workup. No obvious etiology for  PAH is present although she does have the significant risk factor of Fen-Phen use in the past. She has obstructive sleep apnea but this appears to be well treated. At this time I believe she should discuss her lower extremity edema  With cardiology. At that visit we can broach the subject of any utility of a right heart catheterization to further characterize her pulmonary pressures. I will discuss with Drs. Caryl Comes and Tamala Julian.

## 2016-08-04 NOTE — Patient Instructions (Addendum)
We will defer doing any further breathing testing at this time. We may decide to do so at some point if we need to confirm any evidence for asthma.  After you meet Dr Caryl Comes, we can discuss with him and w Dr Tamala Julian the pros/cons or evaluating your pulmonary pressure further with a right heart catherization.  Continue doxycycline until completely gone Continue your CPAP every night Take albuterol 2 puffs up to every 4 hours if needed for shortness of breath.  Follow with Dr Lamonte Sakai in 3 months or sooner if you have any problems.

## 2016-08-04 NOTE — Progress Notes (Signed)
Subjective:    Patient ID: [name], female    DOB: July 09, 1946, 70 y.o.   MRN: [id number]  HPI 70 year old never smoker with a history of gastric bypass surgery, obstructive sleep apnea on CPAP, restless leg syndrome, migraines, B-12 and iron deficiency, history of CVA (MRI 02/24/16).  Underwent monitor for 30 days, no A fib seen . Seen by Dr Tamala Julian, cath in 2010, reassuring myoview in 2015. Also possible history of asthma per her office notes.  Diagnosis was made clinically, sometimes uses albuterol prn. When she has trouble she develops bronchitis sx, cough, dyspnea, wheeze. No PFT. A TTE was done April 2016 that showed normal LV and RV fxn but an estimated PASP of 44mmHg.   She was on diet meds in the past > Fen-Phen for about about a year back in the 90's.   Exertional SOB, occurs with walking up hill.  Has some increased LE edema Her weight has been stable for several years Denies any history of autoimmune disease or any autoimmune disease in her family  ROV 08/04/16 -- This follow-up visit for patient with sleep apnea and possible obstructive lung disease with newly identified pulmonary hypertension on echocardiogram done April 2016. At her last visit we arranged for autoimmune testing, pulmonary function testing, VQ scan. As outlined above she does have a history of diet pill use The VQ scan was low probability for pulmonary embolism. The autoimmune screen was all normal. PFT were done on 7/617 and I have reviewed results personally. This showed normal spirometry, normal lung volumes, normal diffusion capacity. Last visit normal walking oximetry.   Unfortunately since last visit 08/01/16 she had bilateral pretibial edema and erythema. She was started on doxycycline and has had some improvement in erythema.  She describes some specific triggers that can set off dyspnea, dusty house etc.   Review of Systems  Constitutional: Negative for fever and unexpected weight change.  HENT: Negative  for congestion, dental problem, ear pain, nosebleeds, postnasal drip, rhinorrhea, sinus pressure, sneezing, sore throat and trouble swallowing.   Eyes: Negative for redness and itching.  Respiratory: Positive for shortness of breath. Negative for cough, chest tightness and wheezing.   Cardiovascular: Negative for palpitations and leg swelling.  Gastrointestinal: Negative for nausea and vomiting.  Genitourinary: Negative for dysuria.  Musculoskeletal: Negative for joint swelling.  Skin: Negative for rash.  Neurological: Negative for headaches.  Hematological: Does not bruise/bleed easily.  Psychiatric/Behavioral: Negative for dysphoric mood. The patient is not nervous/anxious.    Past Medical History:  Diagnosis Date  . 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 Arthritis    generalized  . Asthma   . Atrophic vaginitis   . Back pain    DDD/stenosis  . Carotid stenosis    Carotid US 10/16: Plaque RICA (123456), normal LICA  . Depression    takes Cymbalta daily  . Diverticulosis    benign  . DJD (degenerative joint disease)   . Dyslipidemia   . Dysrhythmia   . Family history of GI bleeding   . GERD (gastroesophageal reflux disease)    takes Nexium daily  . Gestational diabetes   . H/O hiatal hernia    surgery for hernia  . Headache(784.0)    takes Imitrex daily as needed and Bisoprolol daily;last migraine was about 2wks ago  . History of bronchitis 1 yr ago  . History of shingles   . Insomnia    takes Ambien nightly  . Joint  pain   . Joint swelling   . Leg cramps    takes Flexeril daily as needed  . 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 Nocturia   . Osteoporosis   . Peripheral neuropathy (HCC)    takes Gabapentin daily  . Pneumonia 49yrs ago   hx of  . Restless leg syndrome    takes Requip daily  . RLS (restless legs syndrome) 08/16/2015  . Sleep apnea    uses CPAP  . Tubular adenoma of colon   . Vertigo   . Vitamin D deficiency      Family History    Problem Relation Age of Onset  . CAD Mother   . Hypertension Mother   . Neuropathy Mother   . Osteoarthritis Mother   . Heart disease Mother   . Breast cancer Maternal Grandmother   . CAD Paternal Grandfather   . Colon cancer Father      Social History   Social History  . Marital status: Married    Spouse name: Pilar Jarvis  . Number of children: 3  . Years of education: college   Occupational History  . Retired    Social History Main Topics  . Smoking status: Never Smoker  . Smokeless tobacco: Never Used  . Alcohol use No  . Drug use: No  . Sexual activity: Yes    Birth control/ protection: Post-menopausal   Other Topics Concern  . Not on file   Social History Narrative   Caffeine 8-10 cups daily.  (coffee 1 cup am, unsw tea all day long).      No Known Allergies   Outpatient Medications Prior to Visit  Medication Sig Dispense Refill  . amphetamine-dextroamphetamine (ADDERALL) 10 MG tablet Take 10 mg by mouth daily with breakfast.   0  . baclofen (LIORESAL) 10 MG tablet Take 1 tablet (10 mg total) by mouth 3 (three) times daily. 90 each 12  . bisoprolol (ZEBETA) 10 MG tablet Take 10 mg by mouth 2 (two) times daily.    . calcium carbonate (OS-CAL) 1250 (500 CA) MG chewable tablet Chew 1 tablet by mouth daily. Not sure of dosage    . diclofenac sodium (VOLTAREN) 1 % GEL Apply 2 g topically 4 (four) times daily. 100 g 12  . DULoxetine (CYMBALTA) 30 MG capsule Take 30 mg by mouth 2 (two) times daily.     Marland Kitchen esomeprazole (NEXIUM) 40 MG capsule Take 40 mg by mouth 2 (two) times daily before a meal.   6  . furosemide (LASIX) 20 MG tablet Take 1 tablet (20 mg total) by mouth 2 (two) times daily. 60 tablet 11  . Loratadine (CLARITIN) 10 MG CAPS Take 1 capsule by mouth as needed.    Marland Kitchen LYRICA 100 MG capsule Take 1 capsule (100 mg total) by mouth 2 (two) times daily. 60 capsule 5  . ondansetron (ZOFRAN ODT) 4 MG disintegrating tablet Take 1 tablet (4 mg total) by mouth every 8  (eight) hours as needed for nausea or vomiting. 45 tablet 11  . PROAIR HFA 108 (90 Base) MCG/ACT inhaler INL 2 PFS PO Q 4 H PRN  5  . quiNINE (QUALAQUIN) 324 MG capsule TAKE 1 CAPSULE AFTER LUNCH AND AFTER DINNER. 60 capsule 6  . rOPINIRole (REQUIP) 1 MG tablet TAKE 1 TABLET BY MOUTH AT BEDTIME AS NEEDED. MAY REPEAT DOSE 1 TIME AS NEEDED 180 tablet 0  . spironolactone (ALDACTONE) 25 MG tablet Take 1 tablet (25 mg total) by mouth 2 (two)  times daily. 60 tablet 11  . topiramate (TOPAMAX) 100 MG tablet Take 2 tablets (200 mg total) by mouth at bedtime. 60 tablet 11  . Vitamin D, Ergocalciferol, (DRISDOL) 50000 units CAPS capsule Take 1 capsule (50,000 Units total) by mouth every 7 (seven) days. 12 capsule 1  . zolpidem (AMBIEN) 10 MG tablet Take 10 mg by mouth at bedtime.   4  . doxycycline (VIBRA-TABS) 100 MG tablet Take 1 tablet (100 mg total) by mouth 2 (two) times daily. 20 tablet 0   No facility-administered medications prior to visit.          Objective:   Physical Exam Vitals:   08/04/16 1603  BP: 126/78  Pulse: 72  SpO2: 100%  Weight: 232 lb (105.2 kg)  Height: 5' 6.5" (1.689 m)   Gen: Pleasant, overweight owman, in no distress,  normal affect  ENT: No lesions,  mouth clear,  oropharynx clear, no postnasal drip  Neck: No JVD, no TMG, no carotid bruits  Lungs: No use of accessory muscles, clear without rales or rhonchi  Cardiovascular: RRR, heart sounds normal, 2/6 systolic M, trace LE edema  Musculoskeletal: No deformities, no cyanosis or clubbing  Neuro: alert, non focal  Skin: feet are slightly cyanotic, cool     Assessment & Plan:  Secondary pulmonary hypertension ([place]) Patient has had normal pulmonary function testing, normal VQ scan, unrevealing autoimmune workup. No obvious etiology for  PAH is present although she does have the significant risk factor of Fen-Phen use in the past. She has obstructive sleep apnea but this appears to be well treated. At this time  I believe she should discuss her lower extremity edema  With cardiology. At that visit we can broach the subject of any utility of a right heart catheterization to further characterize her pulmonary pressures. I will discuss with Drs. Caryl Comes and Tamala Julian.  OSA on CPAP Appears to be well treated and stable on her on a titration device. She has good compliance.  Cellulitis In the setting of increased lower extremity edema. She was started on doxycycline by Dr Melvyn Novas, will continue this to completion. Her erythema is improving.   Asthma Her clinical history is quite compelling for mild intermittent asthma that appears to be trigger related. Her primary function testing is normal without bronchodilator response. She could still have occult asthma. I discussed possible methacholine challenge with her today. She would like to defer for now. We may revisit in the future.  Baltazar Apo, MD, PhD 08/04/2016, 5:28 PM [facility] [phone] or if no answer [phone]

## 2016-08-04 NOTE — Assessment & Plan Note (Signed)
In the setting of increased lower extremity edema. She was started on doxycycline by Dr Melvyn Novas, will continue this to completion. Her erythema is improving.

## 2016-08-04 NOTE — Assessment & Plan Note (Signed)
Appears to be well treated and stable on her on a titration device. She has good compliance.

## 2016-08-08 ENCOUNTER — Encounter: Payer: Self-pay | Admitting: Internal Medicine

## 2016-08-08 ENCOUNTER — Telehealth: Payer: Self-pay | Admitting: Interventional Cardiology

## 2016-08-08 ENCOUNTER — Ambulatory Visit (INDEPENDENT_AMBULATORY_CARE_PROVIDER_SITE_OTHER): Payer: Medicare Other | Admitting: Internal Medicine

## 2016-08-08 VITALS — BP 106/76 | HR 77 | Ht 66.0 in | Wt 226.8 lb

## 2016-08-08 DIAGNOSIS — I639 Cerebral infarction, unspecified: Secondary | ICD-10-CM | POA: Diagnosis not present

## 2016-08-08 MED ORDER — FUROSEMIDE 20 MG PO TABS: ORAL_TABLET | ORAL | Status: DC

## 2016-08-08 NOTE — Telephone Encounter (Signed)
Walk in pt form-pt needs to speak with Dr.Smith or Nurse-gave message to [place].

## 2016-08-08 NOTE — Patient Instructions (Addendum)
Medication Instructions: - Your physician has recommended you make the following change in your medication:  1) Take lasix (furosemide) 20 mg - two tablets (40 mg) once daily  Labwork: - none today  Procedures/Testing: - Your physician has recommended that you have an implantable loop recorder (LINQ) implanted- Laquita Harlan, Dr. Olin Pia nurse, will call to schedule.  - possible right heart cath- pending   Follow-Up: - pending procedure date  Any Additional Special Instructions Will Be Listed Below (If Applicable).     If you need a refill on your cardiac medications before your next appointment, please call your pharmacy.

## 2016-08-08 NOTE — Progress Notes (Signed)
ELECTROPHYSIOLOGY CONSULT NOTE  Patient ID: [name], MRN: [id number], DOB/AGE: Jun 20, 1946 70 y.o. Admit date: (Not on file) Date of Consult: 08/08/2016  Primary Physician: [name], MD Primary Cardiologist: new Consulting Physician Baxter Flattery  Chief Complaint: CVA  Loop recorder   HPI [name] is a 70 y.o. female  Referred for consideration of Loop recorder implantation. She has a history of stroke consistent with an embolic origin. 4/17 30 day heart monitor  unrevealing for atrial fibrillation 4/17 Echocardiogram demonstrated normal LV function.   11/15 Myoview negative for ischemia   She has a history of dyslipidemia; she is not on statins because of intolerance. She's been started on  PCSK9   Her neurological history is complex with migraine headaches, cervical dystonia, occipital nerve pain  She has a history of pulmonary hypertension. Echocardiogram 4/17 PA pressure estimated 40. EE prime E/E' 7   She has a history of sleep apnea.  Remote exposure to Phen-Fens  pulmonary evaluation at that time was unrevealing.  His history of obesity with prior gastric bypass.  Past Medical History:  Diagnosis Date  . 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 Arthritis    generalized  . Asthma   . Atrophic vaginitis   . Back pain    DDD/stenosis  . Carotid stenosis    Carotid US 10/16: Plaque RICA (123456), normal LICA  . Depression    takes Cymbalta daily  . Diverticulosis    benign  . DJD (degenerative joint disease)   . Dyslipidemia   . Dysrhythmia   . Family history of GI bleeding   . GERD (gastroesophageal reflux disease)    takes Nexium daily  . Gestational diabetes   . H/O hiatal hernia    surgery for hernia  . Headache(784.0)    takes Imitrex daily as needed and Bisoprolol daily;last migraine was about 2wks ago  . History of bronchitis 1 yr ago  . History of shingles   . Insomnia    takes Ambien nightly  . Joint pain   . Joint  swelling   . Leg cramps    takes Flexeril daily as needed  . 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 Nocturia   . Osteoporosis   . Peripheral neuropathy (HCC)    takes Gabapentin daily  . Pneumonia 17yrs ago   hx of  . Restless leg syndrome    takes Requip daily  . RLS (restless legs syndrome) 08/16/2015  . Sleep apnea    uses CPAP  . Tubular adenoma of colon   . Vertigo   . Vitamin D deficiency       Surgical History:  Past Surgical History:  Procedure Laterality Date  . COLONOSCOPY    . ESOPHAGOGASTRODUODENOSCOPY    . excess skin removal     post weight loss  . GASTRIC BYPASS  2001  . HERNIA REPAIR     umbilical  . JOINT REPLACEMENT Right    knee  . skin reduction  2003 2004   stomach and arm  . STEROID INJECTION TO SCAR     x 2 in back   . TOTAL KNEE ARTHROPLASTY Left 04/03/2015   Procedure: LEFT TOTAL KNEE ARTHROPLASTY;  Surgeon: Melrose Nakayama, MD;  Location: [facility];  Service: Orthopedics;  Laterality: Left;  . WISDOM TOOTH EXTRACTION       Home Meds: Prior to Admission medications   Medication Sig Start Date End Date Taking? Authorizing  Provider  amphetamine-dextroamphetamine (ADDERALL) 10 MG tablet Take 10 mg by mouth daily with breakfast.  01/02/15  Yes Historical Provider, MD  baclofen (LIORESAL) 10 MG tablet Take 1 tablet (10 mg total) by mouth 3 (three) times daily. 07/17/16  Yes Melvenia Beam, MD  bisoprolol (ZEBETA) 10 MG tablet Take 10 mg by mouth 2 (two) times daily.   Yes Historical Provider, MD  calcium carbonate (OS-CAL) 1250 (500 CA) MG chewable tablet Chew 1 tablet by mouth daily. Not sure of dosage   Yes Historical Provider, MD  diclofenac sodium (VOLTAREN) 1 % GEL Apply 2 g topically 4 (four) times daily. 01/24/16  Yes Melvenia Beam, MD  doxycycline (DORYX) 100 MG EC tablet Take 100 mg by mouth 2 (two) times daily.   Yes Historical Provider, MD  DULoxetine (CYMBALTA) 30 MG capsule Take 30 mg by mouth 2 (two) times daily.    Yes Historical Provider, MD    esomeprazole (NEXIUM) 40 MG capsule Take 40 mg by mouth 2 (two) times daily before a meal.  12/20/14  Yes Historical Provider, MD  Evolocumab (REPATHA SURECLICK) XX123456 MG/ML SOAJ Inject 1 Dose into the skin. Every other week   Yes Historical Provider, MD  furosemide (LASIX) 20 MG tablet Take 1 tablet (20 mg total) by mouth 2 (two) times daily. 05/25/16  Yes Melvenia Beam, MD  Loratadine (CLARITIN) 10 MG CAPS Take 1 capsule by mouth as needed (for allergies).    Yes Historical Provider, MD  LYRICA 100 MG capsule Take 1 capsule (100 mg total) by mouth 2 (two) times daily. 07/17/16  Yes Melvenia Beam, MD  ondansetron (ZOFRAN ODT) 4 MG disintegrating tablet Take 1 tablet (4 mg total) by mouth every 8 (eight) hours as needed for nausea or vomiting. 07/17/16  Yes Melvenia Beam, MD  PROAIR HFA 108 [phone] Base) MCG/ACT inhaler INL 2 PFS PO Q 4 H PRN 11/26/15  Yes Historical Provider, MD  quiNINE (QUALAQUIN) 324 MG capsule Take 1 capsule by mouth after lunch and after dinner   Yes Historical Provider, MD  rOPINIRole (REQUIP) 1 MG tablet Take 1 mg by mouth at bedtime. May repeat dose 1 time   Yes Historical Provider, MD  spironolactone (ALDACTONE) 25 MG tablet Take 1 tablet (25 mg total) by mouth 2 (two) times daily. 08/01/16  Yes Tanda Rockers, MD  topiramate (TOPAMAX) 100 MG tablet Take 2 tablets (200 mg total) by mouth at bedtime. 07/17/16  Yes Melvenia Beam, MD  Vitamin D, Ergocalciferol, (DRISDOL) 50000 units CAPS capsule Take 1 capsule (50,000 Units total) by mouth every 7 (seven) days. 01/10/16  Yes Carmen Dohmeier, MD  zolpidem (AMBIEN) 10 MG tablet Take 10 mg by mouth at bedtime.  01/02/15  Yes Historical Provider, MD    Allergies: No Known Allergies  Social History   Social History  . Marital status: Married    Spouse name: Pilar Jarvis  . Number of children: 3  . Years of education: college   Occupational History  . Retired    Social History Main Topics  . Smoking status: Never Smoker  .  Smokeless tobacco: Never Used  . Alcohol use No  . Drug use: No  . Sexual activity: Yes    Birth control/ protection: Post-menopausal   Other Topics Concern  . Not on file   Social History Narrative   Caffeine 8-10 cups daily.  (coffee 1 cup am, unsw tea all day long).      Family  History  Problem Relation Age of Onset  . CAD Mother   . Hypertension Mother   . Neuropathy Mother   . Osteoarthritis Mother   . Heart disease Mother   . Breast cancer Maternal Grandmother   . CAD Paternal Grandfather   . Colon cancer Father      ROS:  Please see the history of present illness.     All other systems reviewed and negative.    Physical Exam:  Blood pressure 106/76, pulse 77, height 5\' 6"  (1.676 m), weight 226 lb 12.8 oz (102.9 kg). General: Well developed, well nourished female in no acute distress. Head: Normocephalic, atraumatic, sclera non-icteric, no xanthomas, nares are without discharge. EENT: normal  Lymph Nodes:  none Neck: Negative for carotid bruits. JVD not elevated. Back:without scoliosis kyphosis Lungs: Clear bilaterally to auscultation without wheezes, rales, or rhonchi. Breathing is unlabored. Heart: RRR with S1 S2. 2/6 systolic  murmur . No rubs, or gallops appreciated. Abdomen: Soft, non-tender, non-distended with normoactive bowel sounds. No hepatomegaly. No rebound/guarding. No obvious abdominal masses. Msk:  Strength and tone appear normal for age. Extremities: No clubbing or cyanosis. 1  + edema.  Distal pedal pulses are 2+ and equal bilaterally. Skin: Warm and Dry Neuro: Alert and oriented X 3. CN III-XII intact Grossly normal sensory and motor function . Psych:  Responds to questions appropriately with a normal affect.      Labs: Cardiac Enzymes No results for input(s): CKTOTAL, CKMB, TROPONINI in the last 72 hours. CBC Lab Results  Component Value Date   WBC 6.1 03/22/2015   HGB 11.4 (L) 03/22/2015   HCT 37.3 03/22/2015   MCV 88.8 03/22/2015    PLT 297 03/22/2015   PROTIME: No results for input(s): LABPROT, INR in the last 72 hours. Chemistry No results for input(s): NA, K, CL, CO2, BUN, CREATININE, CALCIUM, PROT, BILITOT, ALKPHOS, ALT, AST, GLUCOSE in the last 168 hours.  Invalid input(s): LABALBU Lipids Lab Results  Component Value Date   CHOL 223 (H) 02/27/2016   HDL 48 02/27/2016   LDLCALC 146 (H) 02/27/2016   TRIG 143 02/27/2016   BNP No results found for: PROBNP Thyroid Function Tests: No results for input(s): TSH, T4TOTAL, T3FREE, THYROIDAB in the last 72 hours.  Invalid input(s): FREET3 Miscellaneous No results found for: DDIMER  Radiology/Studies:  No results found.  EKG: Sinus rhythm at 63 Intervals 19/09/41   Assessment and Plan:   Cryptogenic Stroke  Pulm HTN  OSA/Morbid obesity  Peripheral edema   With her edema, and the lack of response to furosemide, we will have her take her 40 mg of Lasix as a single dose It is appropriate at this juncture based on neurological evaluation and -30 day monitor to proceed with implantable loop recorder insertion. This has been reviewed with the patient.  The other question on the table from Dr. Kyung Rudd is whether she needs a right heart catheterization for her pulmonary hypertension. I will recheck out to the heart failure team for their input.  She's currently on a PCSK9 with improvement in Chol --with statin therapy there has been demonstrated a 20% risk reduction in secondary prevention a 40% reduction and cardiovascular events although no change in mortality.  Virl Axe

## 2016-08-10 NOTE — Progress Notes (Signed)
Thank you Dr. Caryl Comes. [name]

## 2016-08-13 ENCOUNTER — Ambulatory Visit
Admission: EM | Admit: 2016-08-13 | Discharge: 2016-08-13 | Discharge status: Absent without leave | Payer: Medicare Other

## 2016-08-14 ENCOUNTER — Other Ambulatory Visit: Payer: Self-pay | Admitting: Neurology

## 2016-08-21 ENCOUNTER — Telehealth: Payer: Self-pay

## 2016-08-21 ENCOUNTER — Ambulatory Visit: Payer: Medicare Other | Admitting: Physical Medicine & Rehabilitation

## 2016-08-21 DIAGNOSIS — L03119 Cellulitis of unspecified part of limb: Secondary | ICD-10-CM | POA: Diagnosis not present

## 2016-08-21 DIAGNOSIS — R102 Pelvic and perineal pain: Secondary | ICD-10-CM | POA: Diagnosis not present

## 2016-08-21 DIAGNOSIS — N950 Postmenopausal bleeding: Secondary | ICD-10-CM | POA: Diagnosis not present

## 2016-08-21 NOTE — Telephone Encounter (Signed)
Called pt after receiving "walk in" form from medical records. Note asked for Dr.Smith to call the pt. Called pt to see if there is something I could help her with or if she needs to speak with Dr.Smith directly. lmtcb if assistance is still needed.

## 2016-08-22 ENCOUNTER — Telehealth: Payer: Self-pay | Admitting: Internal Medicine

## 2016-08-22 NOTE — Telephone Encounter (Signed)
I spoke with the patient earlier today to inform her that per Dr. Caryl Comes, he has reviewed her history with the physicians in the heart failure clinic. They do not feel that the patient needs a right heart cath at this time. She is aware that we will just need to schedule her LINQ recorder to be placed. She would like to have this done on 09/26/16. Procedure scheduled.  Attempted to call the patient back to discuss.  Patty Sermons, CMA left a message on her voice mail to call back.

## 2016-08-28 NOTE — Telephone Encounter (Signed)
I left a message for the patient to call. 

## 2016-09-03 ENCOUNTER — Other Ambulatory Visit: Payer: Self-pay | Admitting: Obstetrics and Gynecology

## 2016-09-03 DIAGNOSIS — N950 Postmenopausal bleeding: Secondary | ICD-10-CM | POA: Diagnosis not present

## 2016-09-03 DIAGNOSIS — Z23 Encounter for immunization: Secondary | ICD-10-CM | POA: Diagnosis not present

## 2016-09-03 DIAGNOSIS — R102 Pelvic and perineal pain: Secondary | ICD-10-CM | POA: Diagnosis not present

## 2016-09-04 ENCOUNTER — Ambulatory Visit (INDEPENDENT_AMBULATORY_CARE_PROVIDER_SITE_OTHER): Payer: Medicare Other | Admitting: Neurology

## 2016-09-04 ENCOUNTER — Other Ambulatory Visit: Payer: Self-pay | Admitting: Neurology

## 2016-09-04 VITALS — BP 110/70 | HR 61

## 2016-09-04 DIAGNOSIS — G43711 Chronic migraine without aura, intractable, with status migrainosus: Secondary | ICD-10-CM

## 2016-09-04 MED ORDER — PREGABALIN 50 MG PO CAPS: 50.0000 mg | ORAL_CAPSULE | Freq: Every day | ORAL | 5 refills | Status: DC

## 2016-09-04 NOTE — Progress Notes (Signed)
Consent Form Botulism Toxin Injection For Chronic Migraine  Botulism toxin has been approved by the Federal drug administration for treatment of chronic migraine. Botulism toxin does not cure chronic migraine and it may not be effective in some patients.  The administration of botulism toxin is accomplished by injecting a small amount of toxin into the muscles of the neck and head. Dosage must be titrated for each individual. Any benefits resulting from botulism toxin tend to wear off after 3 months with a repeat injection required if benefit is to be maintained. Injections are usually done every 3-4 months with maximum effect peak achieved by about 2 or 3 weeks. Botulism toxin is expensive and you should be sure of what costs you will incur resulting from the injection.  The side effects of botulism toxin use for chronic migraine may include:   -Transient, and usually mild, facial weakness with facial injections  -Transient, and usually mild, head or neck weakness with head/neck injections  -Reduction or loss of forehead facial animation due to forehead muscle              weakness  -Eyelid drooping  -Dry eye  -Pain at the site of injection or bruising at the site of injection  -Double vision  -Potential unknown long term risks  Contraindications: You should not have Botox if you are pregnant, nursing, allergic to albumin, have an infection, skin condition, or muscle weakness at the site of the injection, or have myasthenia gravis, Lambert-Eaton syndrome, or ALS.  It is also possible that as with any injection, there may be an allergic reaction or no effect from the medication. Reduced effectiveness after repeated injections is sometimes seen and rarely infection at the injection site may occur. All care will be taken to prevent these side effects. If therapy is given over a long time, atrophy and wasting in the muscle injected may occur. Occasionally the patient's become refractory to  treatment because they develop antibodies to the toxin. In this event, therapy needs to be modified.  I have read the above information and consent to the administration of botulism toxin.    ______________  _____   _________________  Patient signature     Date   Witness signature       BOTOX PROCEDURE NOTE FOR MIGRAINE HEADACHE    Contraindications and precautions discussed with patient(above). Aseptic procedure was observed and patient tolerated procedure. Procedure performed by Dr. Georgia Dom  The condition has existed for more than 6 months, and pt does not have a diagnosis of ALS, Myasthenia Gravis or Lambert-Eaton Syndrome. Risks and benefits of injections discussed and pt agrees to proceed with the procedure. Written consent obtained  These injections are medically necessary. He receives good benefits from these injections. These injections do not cause sedations or hallucinations which the oral therapies may cause.  Indication/Diagnosis: chronic migraine BOTOX(J0585) injection was performed according to protocol by [employer]. 200 units of BOTOX was dissolved into 4 cc NS.  NDC: [address]-70      [] Hide copied text [] Hover for attribution information Botox-100unitsx2 vials Lot: [id number] Expiration: 01/2019 NDC: 0023/1145-01 [id number]  0.9% Sodium Chloride- 6mL total [id number] Expiration: 10/2017 NDC: 54627-035-00            Description of procedure:  The patient was placed in a sitting position. The standard protocol was used for Botox as follows, with 5 units of Botox injected at each site:   -Procerus muscle, midline injection  -Corrugator muscle, bilateral injection  -Frontalis  muscle, bilateral injection, with 2 sites each side, medial injection was performed in the upper one third of the frontalis muscle, in the region vertical from the medial inferior edge of the superior orbital rim. The lateral injection was again in the upper one third  of the forehead vertically above the lateral limbus of the cornea, 1.5 cm lateral to the medial injection site.  -Temporalis muscle injection, 4 sites, bilaterally. The first injection was 3 cm above the tragus of the ear, second injection site was 1.5 cm to 3 cm up from the first injection site in line with the tragus of the ear. The third injection site was 1.5-3 cm forward between the first 2 injection sites. The fourth injection site was 1.5 cm posterior to the second injection site.  -Occipitalis muscle injection, 3 sites, bilaterally. The first injection was done one half way between the occipital protuberance and the tip of the mastoid process behind the ear. The second injection site was done lateral and superior to the first, 1 fingerbreadth from the first injection. The third injection site was 1 fingerbreadth superiorly and medially from the first injection site.  -Cervical paraspinal muscle injection, 2 sites, bilateral knee first injection site was 1 cm from the midline of the cervical spine, 3 cm inferior to the lower border of the occipital protuberance. The second injection site was 1.5 cm superiorly and laterally to the first injection site. One extra injection in the cervical paraspinals  -Trapezius muscle injection was performed at 3 sites, bilaterally. The first injection site was in the upper trapezius muscle halfway between the inflection point of the neck, and the acromion. The second injection site was one half way between the acromion and the first injection site. The third injection was done between the first injection site and the inflection point of the neck. 4 extra injections were place in the Trapezius.   Will return for repeat injection in 3 months.   A 200 unit sof Botox was used, 195 units were injected, the rest of the Botox was wasted. The patient tolerated the procedure well, there were no complications of the above procedure.

## 2016-09-04 NOTE — Telephone Encounter (Signed)
I left a message for the patient to please call if she has not received her letter of instructions or has any questions about this, otherwise no need for a call back.

## 2016-09-04 NOTE — Progress Notes (Signed)
Botox-100unitsx2 vials Lot: [id number] Expiration: 01/2019 NDC: 0023/1145-01 [id number]  0.9% Sodium Chloride- 1mL total [id number] Expiration: 10/2017 NDC: 36067-703-40  *Patient's botox buy and bill Dx code: B52.481

## 2016-09-05 DIAGNOSIS — R739 Hyperglycemia, unspecified: Secondary | ICD-10-CM | POA: Diagnosis not present

## 2016-09-05 DIAGNOSIS — I639 Cerebral infarction, unspecified: Secondary | ICD-10-CM | POA: Diagnosis not present

## 2016-09-11 DIAGNOSIS — I8310 Varicose veins of unspecified lower extremity with inflammation: Secondary | ICD-10-CM | POA: Diagnosis not present

## 2016-09-11 DIAGNOSIS — L718 Other rosacea: Secondary | ICD-10-CM | POA: Diagnosis not present

## 2016-09-17 DIAGNOSIS — Z0000 Encounter for general adult medical examination without abnormal findings: Secondary | ICD-10-CM | POA: Diagnosis not present

## 2016-09-17 DIAGNOSIS — E538 Deficiency of other specified B group vitamins: Secondary | ICD-10-CM | POA: Diagnosis not present

## 2016-09-17 DIAGNOSIS — I10 Essential (primary) hypertension: Secondary | ICD-10-CM | POA: Diagnosis not present

## 2016-09-17 DIAGNOSIS — E559 Vitamin D deficiency, unspecified: Secondary | ICD-10-CM | POA: Diagnosis not present

## 2016-09-17 DIAGNOSIS — I639 Cerebral infarction, unspecified: Secondary | ICD-10-CM | POA: Diagnosis not present

## 2016-09-24 ENCOUNTER — Telehealth: Payer: Self-pay | Admitting: Internal Medicine

## 2016-09-24 NOTE — Telephone Encounter (Signed)
I called and spoke with the patient- her questions regarding her procedure were answered.

## 2016-09-24 NOTE — Telephone Encounter (Signed)
New message    Patient has some question regarding upcoming procedure

## 2016-09-26 ENCOUNTER — Ambulatory Visit (HOSPITAL_COMMUNITY)
Admission: RE | Admit: 2016-09-26 | Discharge: 2016-09-26 | Disposition: A | Discharge status: Home / Self Care | Payer: Medicare Other | Source: Ambulatory Visit | Attending: Internal Medicine | Admitting: Internal Medicine

## 2016-09-26 ENCOUNTER — Encounter (HOSPITAL_COMMUNITY)
Admission: RE | Disposition: A | Discharge status: Home / Self Care | Payer: Self-pay | Source: Ambulatory Visit | Attending: Internal Medicine

## 2016-09-26 DIAGNOSIS — Z5309 Procedure and treatment not carried out because of other contraindication: Secondary | ICD-10-CM | POA: Insufficient documentation

## 2016-09-26 DIAGNOSIS — N63 Unspecified lump in unspecified breast: Secondary | ICD-10-CM | POA: Diagnosis not present

## 2016-09-26 SURGERY — LOOP RECORDER INSERTION
Anesthesia: LOCAL

## 2016-09-26 NOTE — Progress Notes (Signed)
Late entry: The patient came to the hospital today for a loop implant.  She mentioned to staff in the last few months noting an unusual/irregular breast lump on the left with plans to be seen by her PMD evaluation.  Dr. Caryl Comes was made aware and felt this needed to be evaluated first.  I discussed this with the patient she was encouraged to pursue evaluation and the importance of this.  Loop implant was postponed so the device would not get in the way of any imaging she may require, she understood and agreed.  She was instructed to call the office and update [place] with findings and plan to reschedule for loop implant once this is completed.  Tommye Standard, PA-C

## 2016-10-01 ENCOUNTER — Telehealth: Payer: Self-pay | Admitting: Internal Medicine

## 2016-10-01 NOTE — Telephone Encounter (Signed)
New Message:   Pt says the [facility] said that they need a referral from Dr Caryl Comes to do the Mammogram and Ultrasound please.

## 2016-10-02 NOTE — Telephone Encounter (Signed)
Per Dr. Caryl Comes- this should be ordered through the patient's PCP. He has forwarded a note to [name]. I have also notified the patient of Dr. Olin Pia recommendations. I have left a message for Dr. Julianne Rice nurse to follow up with me as well- [phone].

## 2016-10-03 ENCOUNTER — Ambulatory Visit: Payer: Medicare Other | Admitting: Neurology

## 2016-10-06 ENCOUNTER — Telehealth: Payer: Self-pay

## 2016-10-06 NOTE — Telephone Encounter (Signed)
I called pt to advise her that her app on 10/09/2016 with Dr. Brett Fairy needs to be r/s because she is not seeing pts that day. No answer, left a message asking her to call me back. If pt calls back, please ask pt if she is willing to see a NP. If she is, please schedule her as such. If not, please schedule her with Dr. Brett Fairy.

## 2016-10-08 ENCOUNTER — Ambulatory Visit: Payer: Medicare Other

## 2016-10-08 ENCOUNTER — Other Ambulatory Visit: Payer: Self-pay | Admitting: Obstetrics and Gynecology

## 2016-10-08 DIAGNOSIS — N6320 Unspecified lump in the left breast, unspecified quadrant: Secondary | ICD-10-CM

## 2016-10-08 NOTE — Telephone Encounter (Signed)
I spoke to pt and advised her that her appt for 10/08/2016 with Dr. Brett Fairy needs to be rescheduled. Pt is agreeable to an appt on 10/23/2016 at 1:00 pm. Pt verbalized understanding of new appt date and time.

## 2016-10-08 NOTE — Telephone Encounter (Signed)
I called to follow up with the patient regarding her mammogram referral as I had not heard back from Dr. Julianne Rice office. I advised the patient of this and wanted to check and see if she had heard from Dr. Julianne Rice office- she states she has not, but she spoke with her GYN - Dr. Christophe Louis yesterday and she will place the referral for the mammogram. I advised the patient to keep [place] updated and let us know after her test is completed. She voices understanding.

## 2016-10-09 ENCOUNTER — Ambulatory Visit: Payer: Medicare Other | Admitting: Neurology

## 2016-10-13 ENCOUNTER — Ambulatory Visit
Admission: RE | Admit: 2016-10-13 | Discharge: 2016-10-13 | Disposition: A | Discharge status: Home / Self Care | Payer: Medicare Other | Source: Ambulatory Visit | Attending: Obstetrics and Gynecology | Admitting: Obstetrics and Gynecology

## 2016-10-13 DIAGNOSIS — N6320 Unspecified lump in the left breast, unspecified quadrant: Secondary | ICD-10-CM

## 2016-10-13 DIAGNOSIS — R928 Other abnormal and inconclusive findings on diagnostic imaging of breast: Secondary | ICD-10-CM | POA: Diagnosis not present

## 2016-10-16 ENCOUNTER — Ambulatory Visit (INDEPENDENT_AMBULATORY_CARE_PROVIDER_SITE_OTHER): Payer: Medicare Other | Admitting: Neurology

## 2016-10-16 ENCOUNTER — Ambulatory Visit: Payer: Medicare Other | Admitting: Neurology

## 2016-10-16 VITALS — BP 140/82 | HR 70

## 2016-10-16 DIAGNOSIS — R790 Abnormal level of blood mineral: Secondary | ICD-10-CM | POA: Diagnosis not present

## 2016-10-16 DIAGNOSIS — E611 Iron deficiency: Secondary | ICD-10-CM | POA: Diagnosis not present

## 2016-10-16 DIAGNOSIS — G243 Spasmodic torticollis: Secondary | ICD-10-CM

## 2016-10-16 DIAGNOSIS — E559 Vitamin D deficiency, unspecified: Secondary | ICD-10-CM | POA: Diagnosis not present

## 2016-10-16 MED ORDER — VITAMIN D (ERGOCALCIFEROL) 1.25 MG (50000 UNIT) PO CAPS: 50000.0000 [IU] | ORAL_CAPSULE | ORAL | 4 refills | Status: DC

## 2016-10-16 NOTE — Progress Notes (Signed)
Botox-100unitsx3 vials Lot: [id number] vial Expiration: 03/2019 OLI:1030-1314-38 [id number]  [id number] 2 vials Expiration: 04/2019 NDC: 4327-6147-09   0.9% Sodium Chloride- 64mL total [id number] Expiration: 04/2018 NDC: 70964-383-81  DX: G24.8 [employer] [place]

## 2016-10-17 LAB — IRON AND TIBC
IRON: 87 ug/dL (ref 27–139)
Iron Saturation: 25 % (ref 15–55)
Total Iron Binding Capacity: 349 ug/dL (ref 250–450)
UIBC: 262 ug/dL (ref 118–369)

## 2016-10-17 LAB — VITAMIN D 25 HYDROXY (VIT D DEFICIENCY, FRACTURES): Vit D, 25-Hydroxy: 25.8 ng/mL — ABNORMAL LOW (ref 30.0–100.0)

## 2016-10-17 LAB — FERRITIN: Ferritin: 61 ng/mL (ref 15–150)

## 2016-10-19 NOTE — Progress Notes (Addendum)
Procedure note for Spasmodic torticollis:  EMG: EMG guidance was used to inject muscles detailed below. Aseptic procedure was performed and patient tolerated procedure. Procedure was performed by Dr. Myrla Halsted   Patient tolerated the procedure.    Refractory to oral medications   Units Injected 200 , Units wasted 0   Motrin / tylenol for injections site pain / soreness   REMS precautions handout given to patient   RTC - see instructions for details   Wasted 0 Units.   jBotox-100unitsx3 vials = 300 units toal  Lot: [id number] vial Expiration: 03/2019 CHE:0352-4818-59 [id number]  [id number] vials Expiration: 04/2019 NDC: 0518-3358-25   0.9% Sodium Chloride- 10mL total [id number] Expiration: 04/2018 NDC: 28118-867-73  DX: G24.8 [employer]  Muscles injected: 50 right levator scapula(prox and distal), 150 right trapezius, 50 right semispinalis capitus, 50 right splenius capitus

## 2016-10-20 ENCOUNTER — Telehealth: Payer: Self-pay | Admitting: *Deleted

## 2016-10-20 NOTE — Telephone Encounter (Signed)
-----   Message from Melvenia Beam, MD sent at 10/17/2016  1:30 PM EDT ----- Patient's vitamin D was low at 25.8 (normal 30-100). Her iron labs were normal, ferritin is 61 which is much improved (1 year ago it was 8) so no indication for repeat IV iron thanks

## 2016-10-20 NOTE — Telephone Encounter (Signed)
Called and spoke to pt about lab results per AA,MD. She verbalized understanding. Advised her to continue to take Vitamin D, Ergocalciferol, (DRISDOL) 50000 units CAPS  Every 7 days. She stated she has not picked up rx AA,MD called in on 10/16/16. She is going to pick that up today from the pharmacy.

## 2016-10-21 ENCOUNTER — Encounter: Payer: Self-pay | Admitting: Neurology

## 2016-10-23 ENCOUNTER — Ambulatory Visit (INDEPENDENT_AMBULATORY_CARE_PROVIDER_SITE_OTHER): Payer: Medicare Other | Admitting: Neurology

## 2016-10-23 ENCOUNTER — Encounter: Payer: Self-pay | Admitting: Neurology

## 2016-10-23 VITALS — BP 110/68 | HR 76 | Resp 20 | Ht 72.0 in | Wt 234.0 lb

## 2016-10-23 DIAGNOSIS — G2581 Restless legs syndrome: Secondary | ICD-10-CM | POA: Diagnosis not present

## 2016-10-23 DIAGNOSIS — G4733 Obstructive sleep apnea (adult) (pediatric): Secondary | ICD-10-CM | POA: Diagnosis not present

## 2016-10-23 DIAGNOSIS — Z9989 Dependence on other enabling machines and devices: Secondary | ICD-10-CM

## 2016-10-23 DIAGNOSIS — M436 Torticollis: Secondary | ICD-10-CM

## 2016-10-23 MED ORDER — PREGABALIN 50 MG PO CAPS: 50.0000 mg | ORAL_CAPSULE | Freq: Every day | ORAL | 3 refills | Status: DC

## 2016-10-23 MED ORDER — LYRICA 100 MG PO CAPS: 100.0000 mg | ORAL_CAPSULE | Freq: Every evening | ORAL | 3 refills | Status: DC

## 2016-10-23 MED ORDER — ZOLPIDEM TARTRATE 10 MG PO TABS: 10.0000 mg | ORAL_TABLET | Freq: Every day | ORAL | 4 refills | Status: DC

## 2016-10-23 MED ORDER — ROPINIROLE HCL 1 MG PO TABS: 1.0000 mg | ORAL_TABLET | Freq: Two times a day (BID) | ORAL | 3 refills | Status: DC

## 2016-10-23 MED ORDER — QUININE SULFATE 324 MG PO CAPS: 324.0000 mg | ORAL_CAPSULE | Freq: Two times a day (BID) | ORAL | 3 refills | Status: DC

## 2016-10-23 NOTE — Patient Instructions (Signed)
Restless Legs Syndrome Restless legs syndrome is a condition that causes uncomfortable feelings or sensations in the legs, especially while sitting or lying down. The sensations usually cause an overwhelming urge to move the legs. The arms can also sometimes be affected. The condition can range from mild to severe. The symptoms often interfere with a person's ability to sleep. CAUSES The cause of this condition is not known. RISK FACTORS This condition is more likely to develop in:  People who are older than age 50.  Pregnant women. In general, restless legs syndrome is more common in women than in men.  People who have a family history of the condition.  People who have certain medical conditions, such as iron deficiency, kidney disease, Parkinson disease, or nerve damage.  People who take certain medicines, such as medicines for high blood pressure, nausea, colds, allergies, depression, and some heart conditions. SYMPTOMS The main symptom of this condition is uncomfortable sensations in the legs. These sensations may be:  Described as pulling, tingling, prickling, throbbing, crawling, or burning.  Worse while you are sitting or lying down.  Worse during periods of rest or inactivity.  Worse at night, often interfering with your sleep.  Accompanied by a very strong urge to move your legs.  Temporarily relieved by movement of your legs. The sensations usually affect both sides of the body. The arms can also be affected, but this is rare. People who have this condition often have tiredness during the day because of their lack of sleep at night. DIAGNOSIS This condition may be diagnosed based on your description of the symptoms. You may also have tests, including blood tests, to check for other conditions that may lead to your symptoms. In some cases, you may be asked to spend some time in a sleep lab so your sleeping can be monitored. TREATMENT Treatment for this condition is  focused on managing the symptoms. Treatment may include:  Self-help and lifestyle changes.  Medicines. HOME CARE INSTRUCTIONS  Take medicines only as directed by your health care provider.  Try these methods to get temporary relief from the uncomfortable sensations:  Massage your legs.  Walk or stretch.  Take a cold or hot bath.  Practice good sleep habits. For example, go to bed and get up at the same time every day.  Exercise regularly.  Practice ways of relaxing, such as yoga or meditation.  Avoid caffeine and alcohol.  Do not use any tobacco products, including cigarettes, chewing tobacco, or electronic cigarettes. If you need help quitting, ask your health care provider.  Keep all follow-up visits as directed by your health care provider. This is important. SEEK MEDICAL CARE IF: Your symptoms do not improve with treatment, or they get worse.   This information is not intended to replace advice given to you by your health care provider. Make sure you discuss any questions you have with your health care provider.   Document Released: 11/21/2002 Document Revised: 04/17/2015 Document Reviewed: 11/27/2014 [employer] Interactive Patient Education 2016 [employer]  

## 2016-10-23 NOTE — Progress Notes (Signed)
[facility]   Provider:  Larey Seat, M D  Referring Provider: [name], MD Primary Care Physician:    Chief Complaint  Patient presents with  . Follow-up    cpap and RLS    HPI:  [name] is a 70 y.o. female  Is seen here as a revisit for snoring and restless legs.  I have followed [name] from 2003 through 2008  And see her now for CPAP compliance.  The patient had undergone gastric bypass surgery, which led to an extensive loss of body weight. In the process she was no longer suffering from obstructive sleep apnea related to the weight loss, but she developed restless legs. Repeated iron studies had shown that she had extremely low ferritin levels. A ferritin level lower than 50 is associated with a much higher incidence of restless leg syndrome in addition her iron and iron binding capacities have been surprisingly normal and this conflict of data we have discussed today. Usually I would treat the patient with Ferrlecit and infusion once every 3 months or so to keep up ferritin levels in in her case I am not sure how much free iron is actually in her system. 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 In addition she is vitamin D deficient which are almost 80% of adult Americans. Gained some weight back but she has no longer nausea or dumping syndrome so therefore she does not need Phenergan or Zofran. She is using gabapentin and ropinirole to treat her restless legs she often wakes up with a headache and she suffers from at times severe crippling migraines. Leg cramps also interfere with her sleep the initiation of sleep as well as the sleep duration at night.  As her BMI exceeds 35, she may also have developed apnea again and snoring has been witnessed by her family. We are meeting today to reevaluate all these conditions in context. She is also excessively daytime sleepy for fatigue severity score is endorsed at 63 points her  Epworth sleepiness score was endorsed at 15 points. Bedtime is usually 10 Pm and her husband leaves for work at 6.30 AM , after that she will sleep for 2-3 hours. Total daily sleep time is 14 hours, this is more likely related to depression.   Interval history from 08-16-15 [name] reports that the iron infusion that she received under [name] care helped her greatly for while . By July 2016 she was supposed to follow-up with him but he has meanwhile started her teaching job at the hospital and was not able to follow up. So she is here again because her restless legs are acting up again. She has always responded to iron infusions and as she is status post gastric bypass surgery she does not have a normal absorption for by mouth iron. Her ferritin levels have been clinically and critically low. She underwent a repeat sleep study on 01-10-15 after endorsing the Epworth Sleepiness Scale at 19 points.  Her AHI was 30.3 her RDI was 38.6 supine AHI was 44.5. Was titrated to 10 cm water pressure the titration was not optimal. She was therefore prescribed an hour to titrate her. Now her residual AHI is 2.8 and she remains on an O2 sat at 7 and 15 cm water with full-time EPR of 3 cm. I'm able to obtain a download here in office and reviewed with the patient she has been 100% compliance for days of use, 87%  compliance for over 4 hours of consecutive use and average user time is 6 hours 49 minutes. 95th percentile pressure is 11.5 cm water. Her restless leg syndrome quality of life questionnaire was endorsed at moderate to severe impairment and the RLS 6 rating scale was endorsed at severe impairment. We are going to infuse her once again with iron. She is on Requip 2 mg total. I will see if I can enroll her into our restless leg study. The patient just had a follow-up appointment with [name] her primary care physician,   Today is 12-05-15. #1 She is doing moderately well with her CPAP use was a compliance  of 73% average user time on days of use is 4 hours and 44 minutes. Minimum pressure for this AutoSet 7 maximum 15 cm water. The patient's AHI was 2.5 which is a desirable result. The 95th percentile pressure is 12.3. The patient could not use the machine for about 2 weeks after she contracted an upper respiratory infection doing a vacation stay in [place]. She is now back to using it regularly.  #2 her restless leg question here of was endorsed at moderate impairment quality of life.  It seems that her iron levels have rebounded to some degree , but Ferritin results are pending. Her geriatric depression score does not indicate depression her Epworth sleepiness score was endorsed at 8 points and her fatigue severity score 26 points.  #3 headache are a concern. She hates the pain clinic. We are not providing pain management in this clinic but I do feel that the patient could change with the same narcotic agreement and contract to [name]  Her main care provider. She has no history of overusing up using or accidentally overdosing any medications. Her depression scores are in normal range not indicated clinical depression at all. The patient is aware that she could receive with a headache attack Depakote IV. However I am not thrilled with the opportunity of preventing migrainous headaches by Depakote, given the risk of weight gain, liver failure tremor. She has been on a beta blocker which has helped to some degree. She would be interested in some kind of injection therapy but I explained that Botox injections in the Medicare population on very difficult to get paid for. And Botox is an extremely costly medication these days. The patient has some basic medical training and she would be willing to give herself a shot. We discussed TORADOL  which can be taken either as a 10 mg 2 pill or as an IM medication.  #4 patient will receive 1 mL equaling 2,000,000 g of Toradol intramuscular injection today for her acute  headache. If this works well for her it could be an alternative treatment and hopefully will relieve her of the need to take narcotics to. The patient is a status post gastric bypass surgery so she cannot have nonsteroidal by mouth .  Interval history from 04/09/2016. Mrs. Treanor is seen here today after she has twice been seen by my colleague Dr. [name]. I had sent her to her to evaluate her headaches and possible dystonia and neuralgic character and she was referred for Dr. Kittie Plater for possible Botox injections. Within the workup for headaches and MRI of the brain was obtained. This showed 2 small wedge shaped foci in the left hemisphere consistent with prior small ischemic strokes. She was started on aspirin baby size and Zetia. There was chronic microvascular ischemic change as expected for age, some cortical atrophy no acute  findings. At the time that she saw Dr. [name] she had neck pain for 7-8 months already. Voltaren gel helps a little bit pain on movement. She has migraines without aura that started already when she was a teenager. Tried topiramate in the past has failed amitriptyline, try Toradol, tried Imitrex , Cymbalta.  She has weaned off all pain medications.  CT of the soft tissue of the neck was also reviewed ; there is evidence of cervical disc and facet degeneration. An echocardiogram was ordered revealing a patent foramen ovale or she is scheduled for an occipital nerve block with Dr. Dr. Kittie Plater, he will do a second one next coming Friday. This has helped her neck pain. She has not seen her cardiologist.  Mrs. Harbin presents today on 10/23/2016. She has recently seen Dr. [name] again who has been successfully treating her head and neck pain. 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 The pain has improved. As to her CPAP use is 87% compliant with an average user time of 6 hours and 9 minutes, she is using an AutoSet between 7 and 15 cm water. Her 95th  percentile pressure is 14.4 and straddles close to the maximum pressure allowed. A residual AHI is 7.2 and the residual apneas are obstructive in nature. I do think we should increase the pressure window by 2 cm water. She endorsed today the Epworth sleepiness score at only 4 points.  Her RLS is controlled on 2 nightly doses of medication, each dose giving relief for about 4 hours. She takes quinine for leg cramps. She has  No arryhthmia, has a loop recorder .  Recent mamogram was normal but her cardiologist discovered a mass under the 4 th rib. She is awaiting a CT chest now.     Review of Systems: Out of a complete 14 system review, the patient complains of only the following symptoms, and all other reviewed systems are negative. Restless legs she often wakes up with a headache and she suffers from at times severe crippling migraines.  Leg cramps also interfere with her sleep the initiation of sleep as well as the sleep duration at night.  Her daughter K. Strickland and her mother have been extremely sleepy. Family history   Epworth score 4 , Fatigue severity score 24 from 34 ,  depression score 3. She started exercising after knee surgery.  She lost a lot of weight, she is exercising and she weaned aff all narcotics.     Social History   Social History  . Marital status: Married    Spouse name: Pilar Jarvis  . Number of children: 3  . Years of education: college   Occupational History  . Retired    Social History Main Topics  . Smoking status: Never Smoker  . Smokeless tobacco: Never Used  . Alcohol use No  . Drug use: No  . Sexual activity: Yes    Birth control/ protection: Post-menopausal   Other Topics Concern  . Not on file   Social History Narrative   Caffeine 8-10 cups daily.  (coffee 1 cup am, unsw tea all day long).     Family History  Problem Relation Age of Onset  . CAD Mother   . Hypertension Mother   . Neuropathy Mother   . Osteoarthritis Mother   . Heart  disease Mother   . Breast cancer Maternal Grandmother   . CAD Paternal Grandfather   . Colon cancer Father     Past  Medical History:  Diagnosis Date  . 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 Arthritis    generalized  . Asthma   . Atrophic vaginitis   . Back pain    DDD/stenosis  . Carotid stenosis    Carotid US 10/16: Plaque RICA (8-41%), normal LICA  . Depression    takes Cymbalta daily  . Diverticulosis    benign  . DJD (degenerative joint disease)   . Dyslipidemia   . Dysrhythmia   . Family history of GI bleeding   . GERD (gastroesophageal reflux disease)    takes Nexium daily  . Gestational diabetes   . H/O hiatal hernia    surgery for hernia  . Headache(784.0)    takes Imitrex daily as needed and Bisoprolol daily;last migraine was about 2wks ago  . History of bronchitis 1 yr ago  . History of shingles   . Insomnia    takes Ambien nightly  . Joint pain   . Joint swelling   . Leg cramps    takes Flexeril daily as needed  . 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 Nocturia   . Osteoporosis   . Peripheral neuropathy (HCC)    takes Gabapentin daily  . Pneumonia 42yrs ago   hx of  . Restless leg syndrome    takes Requip daily  . RLS (restless legs syndrome) 08/16/2015  . Sleep apnea    uses CPAP  . Tubular adenoma of colon   . Vertigo   . Vitamin D deficiency     Current Outpatient Prescriptions  Medication Sig Dispense Refill  . amphetamine-dextroamphetamine (ADDERALL) 10 MG tablet Take 10 mg by mouth daily with breakfast.   0  . baclofen (LIORESAL) 10 MG tablet Take 1 tablet (10 mg total) by mouth 3 (three) times daily. (Patient taking differently: Take 10 mg by mouth 2 (two) times daily. ) 90 each 12  . bisoprolol (ZEBETA) 10 MG tablet Take 10 mg by mouth 2 (two) times daily.    . calcium carbonate (OS-CAL) 1250 (500 CA) MG chewable tablet Chew 1 tablet by mouth daily. Not sure of dosage    . diclofenac sodium (VOLTAREN) 1 % GEL Apply 2 g  topically 4 (four) times daily. (Patient taking differently: Apply 2 g topically 4 (four) times daily as needed (for pain.). ) 100 g 12  . DULoxetine (CYMBALTA) 60 MG capsule Take 60 mg by mouth 2 (two) times daily.  11  . esomeprazole (NEXIUM) 40 MG capsule Take 40 mg by mouth 2 (two) times daily before a meal.   6  . Evolocumab (REPATHA SURECLICK) 660 MG/ML SOAJ Inject 1 Dose into the skin every 14 (fourteen) days. Every other week     . furosemide (LASIX) 20 MG tablet Take two tablet (40 mg) once daily    . Loratadine (CLARITIN) 10 MG CAPS Take 1 capsule by mouth as needed (for allergies).     Marland Kitchen LYRICA 100 MG capsule Take 1 capsule (100 mg total) by mouth 2 (two) times daily. (Patient taking differently: Take 100 mg by mouth every evening. ) 60 capsule 5  . ondansetron (ZOFRAN ODT) 4 MG disintegrating tablet Take 1 tablet (4 mg total) by mouth every 8 (eight) hours as needed for nausea or vomiting. 45 tablet 11  . pregabalin (LYRICA) 50 MG capsule Take 1 capsule (50 mg total) by mouth daily. 90 capsule 5  . PROAIR HFA 108 (90 Base) MCG/ACT inhaler 2 INHALATIONS EVERY 4  HOURS AS NEEDED FOR ASTHMA SYMPTOMS (WHEEZING/SHORTNESS OF BREATH)  5  . quiNINE (QUALAQUIN) 324 MG capsule Take 324 mg by mouth 2 (two) times daily.     Marland Kitchen rOPINIRole (REQUIP) 1 MG tablet TAKE 1 TABLET BY MOUTH AT BEDTIME AS NEEDED. MAY REPEAT DOSE 1 TIME AS NEEDED 180 tablet 0  . spironolactone (ALDACTONE) 25 MG tablet Take 1 tablet (25 mg total) by mouth 2 (two) times daily. 60 tablet 11  . topiramate (TOPAMAX) 100 MG tablet Take 2 tablets (200 mg total) by mouth at bedtime. 60 tablet 11  . Vitamin D, Ergocalciferol, (DRISDOL) 50000 units CAPS capsule Take 1 capsule (50,000 Units total) by mouth every 7 (seven) days. 12 capsule 4  . zolpidem (AMBIEN) 10 MG tablet Take 10 mg by mouth at bedtime.   4   No current facility-administered medications for this visit.     Allergies as of 10/23/2016  . (No Known Allergies)     Vitals: BP 110/68   Pulse 76   Resp 20   Ht 6' (1.829 m)   Wt 234 lb (106.1 kg)   BMI 31.74 kg/m  Last Weight:  Wt Readings from Last 1 Encounters:  10/23/16 234 lb (106.1 kg)       Last Height:   Ht Readings from Last 1 Encounters:  10/23/16 6' (1.829 m)    Physical exam:  General: The patient is awake, alert and appears not in acute distress. The patient is well groomed. Head: Normocephalic, atraumatic. Neck is now supple. No tenderness. Mallampati 3 ,  neck circumference: 15 inches. Nasal airflow unrestricted, TMJ is not evident. Retrognathia is seen.  Cardiovascular:  Regular rate and rhythm, without  murmurs or carotid bruit, and without distended neck veins. Respiratory: Lungs are clear to auscultation. Skin:  Without evidence of edema, or rash, well healed knee replacement scars.  Trunk: BMI is elevated .  Neurologic exam :The patient is awake and alert, oriented to place and time.  Memory subjective described as intact.  There is a normal attention span & concentration ability. Speech is fluent without  dysarthria, dysphonia or aphasia. Mood and affect are appropriate. Cranial nerves: Pupils are equal and briskly reactive to light.  Extraocular movements  in vertical and horizontal planes intact and without nystagmus. Visual fields by finger perimetry are intact. Hearing to finger rub intact.  Facial sensation intact to fine touch. Facial motor strength is symmetric and tongue and uvula move midline. Shoulder shrug intact . Motor exam:   Normal tone, muscle bulk and symmetric strength in all extremities. No cog wheeling and no waxy or clasp knife response.  Sensory:  Fine touch, pinprick and vibration were tested in all extremities. Proprioception in both feet is affeced by pin and needle dysesthesias.  Coordination: Rapid alternating movements in the fingers/hands is normal. Finger-to-nose maneuver normal without evidence of ataxia, dysmetria or tremor. Gait and  station: Patient walks without assistive device . Strength within normal limits. Stance is stable and normal.  Deep tendon reflexes: in the  upper and lower extremities are symmetric and intact. Babinski maneuver downgoing.   Assessment:  After physical and neurologic examination, review of laboratory studies, imaging, neurophysiology testing and pre-existing records, assessment is   The patient was advised of the nature of the diagnosed sleep disorder , the treatment options and risks for general a health and wellness arising from not treating the condition. Visit duration was 40 minutes.  Of today's new patient visit-consultation 50% of our face-to-face time  are spent in discussion of the medical conditions found the differential diagnosis the testing and treatment options for the conditions. The patient is willing to undergo a new iron infusion cycle .  Her cardiologists note's support this Pernell Dupre ,MD ) , Dr. Waymon Budge.  Since Mrs. Lamboy has undergone a gastric bypass surgery in the past she has some mild absorption issues.  She is chronically iron deficient, she has been vitamin D deficient she has been vitamin B12 deficient. I have prescribed her a vitamin B12 nasal spray, vitamin D 50,000 units weekly pill, and I will obtain iron studies today to see which dose of an iron infusion would be indicated for her.  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 I would like for her to meet with a cardiologist to discuss the relevance to her MRI findings as well as to her clinical symptoms of headaches. The pain in the neck and headaches have both improved and I hope that she can continue Botox therapy through Dr. Elnita Maxwell as well as trigger point injections. Nerve blocks for neuralgia are also ordered. A download of her machine was not yet obtained but I reviewed her restless leg symptomatic and her RLS 6 rating  scale was endorsed at very low impediment there is no evidence at this time of major quality of life impairment based on restless leg intensity. She is not depressed. Is in the process of losing weight and she appears optimistic and motivated. I'm especially proud that she weaned herself off all narcotics.  Plan:  Treatment plan and additional workup :  1 RLS with iron deficiency ) Dr. Beryle Beams  evaluated her discrepancy between very low ferritin levels  and apparently sufficient iron storage. She has maintained oral iron intake as well as multivitamins and multi minerals.   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 She is not diabetic she has no documented thyroid disease and neither did her mother nor has her daughter but all of these women half severe painful dysesthesias leg cramps and restless legs. She took quinine with good success, but had to order from [place].   3) OSA- continue on CPAP, auto PAP . Doing well ! The patient's auto CPAP is set between 7 and 15 cm water with 3 cm EPR she is 83% compliance with an average user time of 6 hours and 10 minutes. Residual AHI is 3.6. 91st percentile pressure is 13.9.  4)  Arthritis pain, improved after knee surgery, Dr. Rhona Raider .   Rv in 6 month. With me.   I thank Dr [name] for her headache expertise and treatment.  The patient has been relieved of neck pain and occipital neuralgic pain and pain with radiculopathic distribution to the shoulders.    Asencion Partridge Prakash Kimberling MD  10/23/2016

## 2016-10-23 NOTE — Progress Notes (Addendum)
Faxed referral to [facility] as a cpap transfer of care pt from [facility], per pt and Dr. Brett Fairy request. Received a receipt of confirmation.

## 2016-10-27 DIAGNOSIS — R222 Localized swelling, mass and lump, trunk: Secondary | ICD-10-CM | POA: Diagnosis not present

## 2016-10-27 DIAGNOSIS — N941 Unspecified dyspareunia: Secondary | ICD-10-CM | POA: Diagnosis not present

## 2016-10-27 DIAGNOSIS — N950 Postmenopausal bleeding: Secondary | ICD-10-CM | POA: Diagnosis not present

## 2016-10-28 ENCOUNTER — Other Ambulatory Visit: Payer: Self-pay | Admitting: Obstetrics and Gynecology

## 2016-10-28 DIAGNOSIS — R222 Localized swelling, mass and lump, trunk: Secondary | ICD-10-CM

## 2016-11-04 ENCOUNTER — Ambulatory Visit: Payer: Medicare Other | Admitting: Emergency Medicine

## 2016-11-04 ENCOUNTER — Ambulatory Visit
Admission: RE | Admit: 2016-11-04 | Discharge: 2016-11-04 | Disposition: A | Discharge status: Home / Self Care | Payer: Medicare Other | Source: Ambulatory Visit | Attending: Obstetrics and Gynecology | Admitting: Obstetrics and Gynecology

## 2016-11-04 DIAGNOSIS — R222 Localized swelling, mass and lump, trunk: Secondary | ICD-10-CM

## 2016-11-04 DIAGNOSIS — R918 Other nonspecific abnormal finding of lung field: Secondary | ICD-10-CM | POA: Diagnosis not present

## 2016-11-04 IMAGING — CT CT CHEST W/ CM
2 of 4 series · 15 of 36 positions shown, 18 images · IV contrast (APPLIED)
Comparison: Limited coronary CTA of [DATE]. Chest radiograph
[DATE]. Chest CT of [DATE].

CLINICAL DATA: Palpable abnormality in upper left chest for several
months. Gastric bypass.

EXAM:
CT CHEST WITH CONTRAST
TECHNIQUE: Multidetector CT imaging of the chest was performed during
intravenous contrast administration.
CONTRAST:  75mL [TB] IOPAMIDOL ([TB]) INJECTION 61%
Creatinine was obtained on site at [HOSPITAL] at [HOSPITAL].
Results: Creatinine 1.0 mg/dL.

[Series 3: cor · coronal · 0.58mm/px · 3 of 137 slices shown]
[im 28/137  lung]
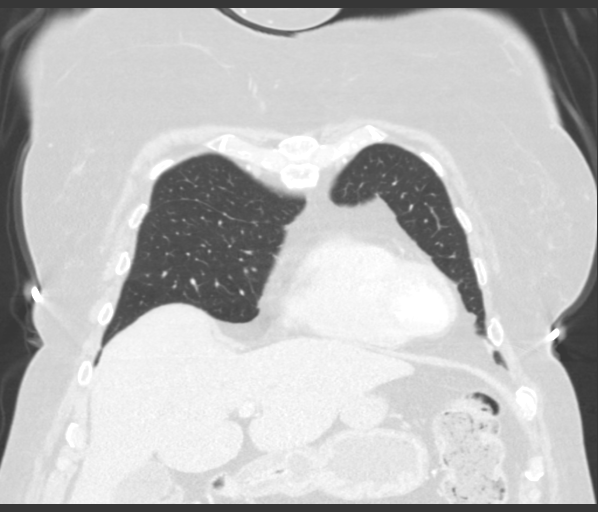
[im 55/137  lung]
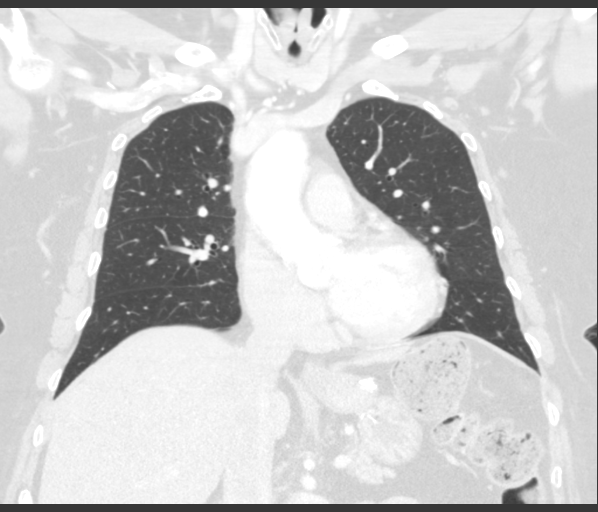
[im 82/137  lung]
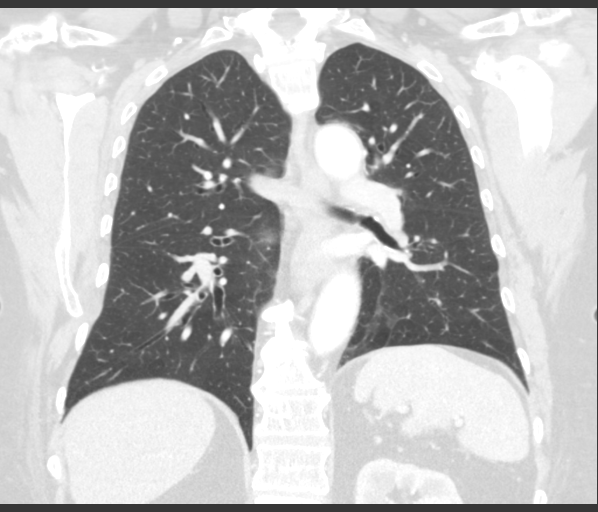

[Series 6: super d · axial · 0.71mm/px · z∈[+996,+1249]mm · 12 of 403 slices shown, 15 images]
[im 21/403  mediastinal]
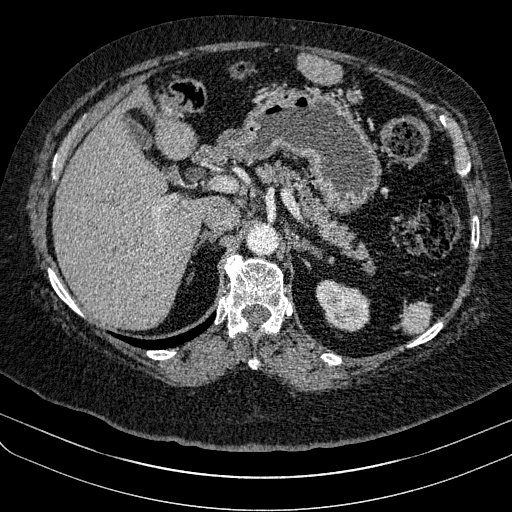
[im 21/403  lung]
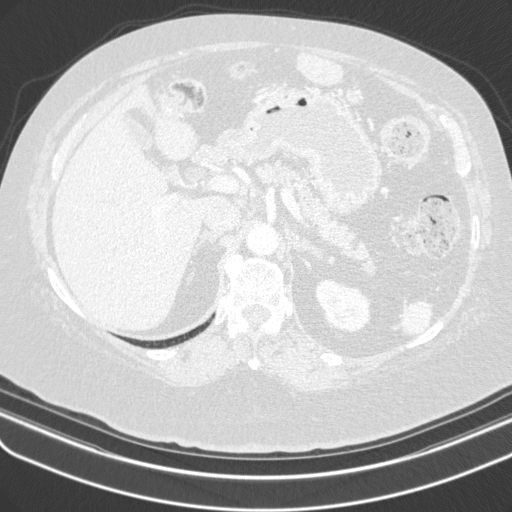
[im 61/403  lung]
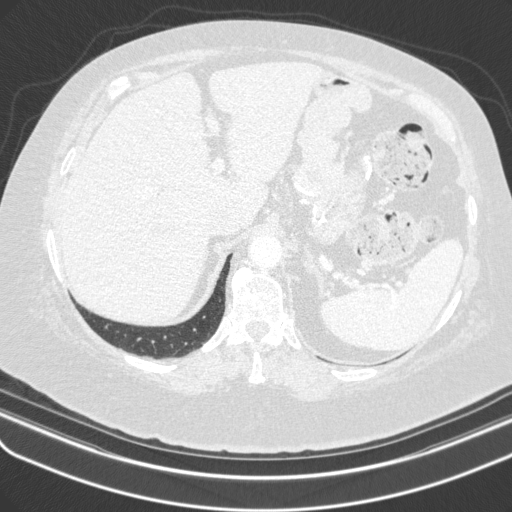
[im 81/403  lung]
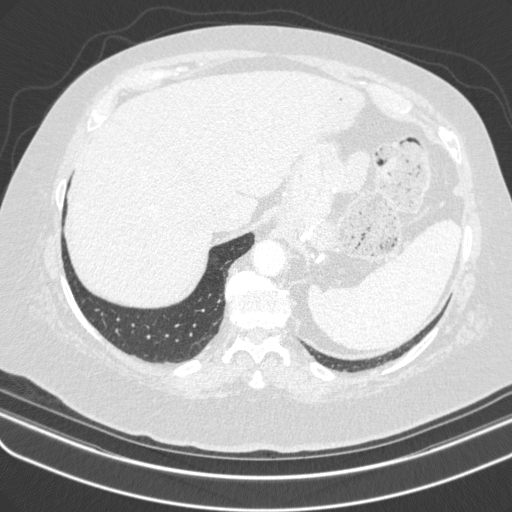
[im 121/403  lung]
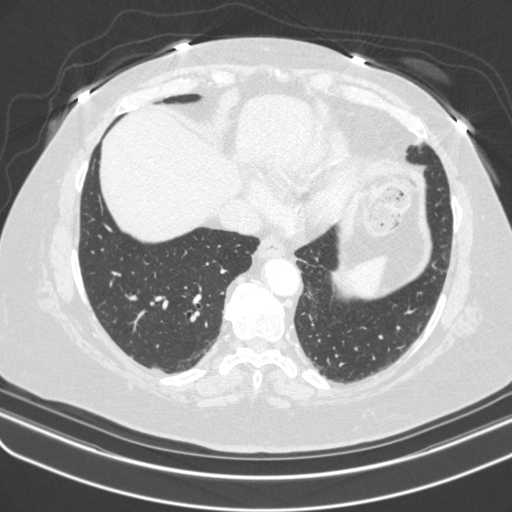
[im 161/403  mediastinal]
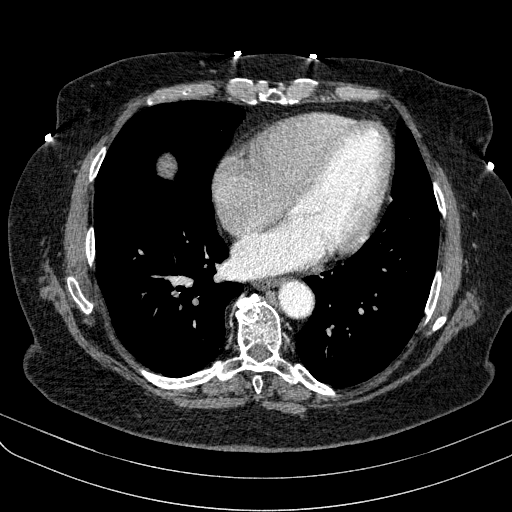
[im 161/403  lung]
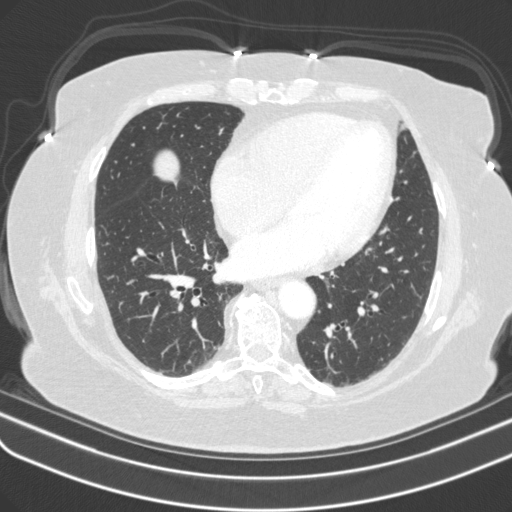
[im 181/403  lung]
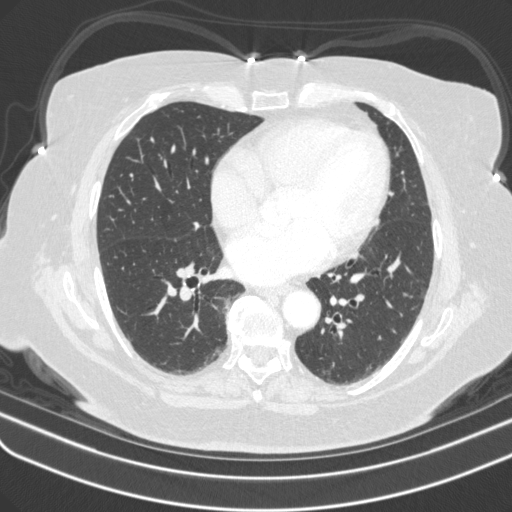
[im 222/403  lung]
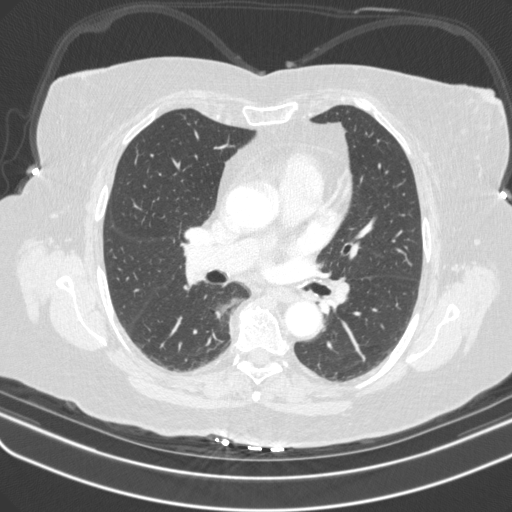
[im 242/403  lung]
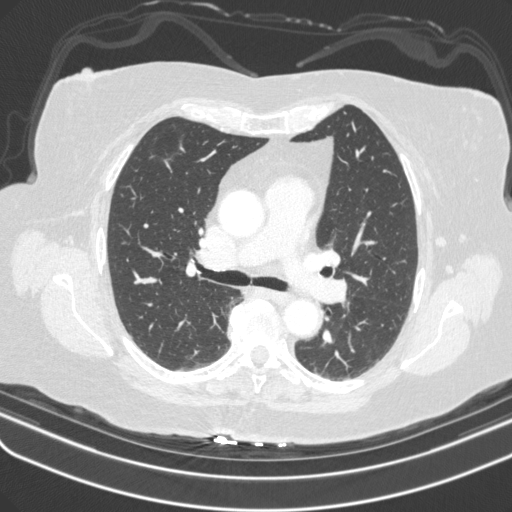
[im 282/403  mediastinal]
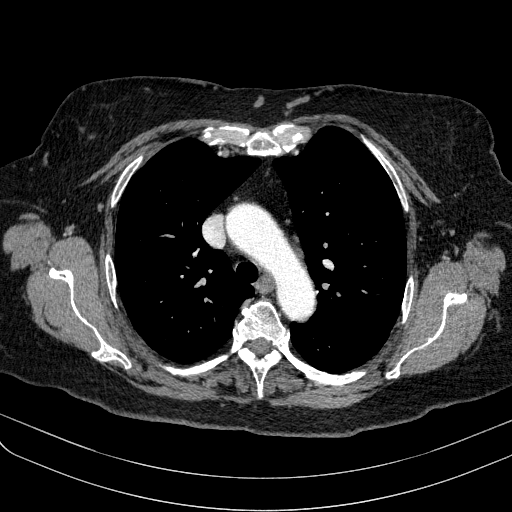
[im 282/403  lung]
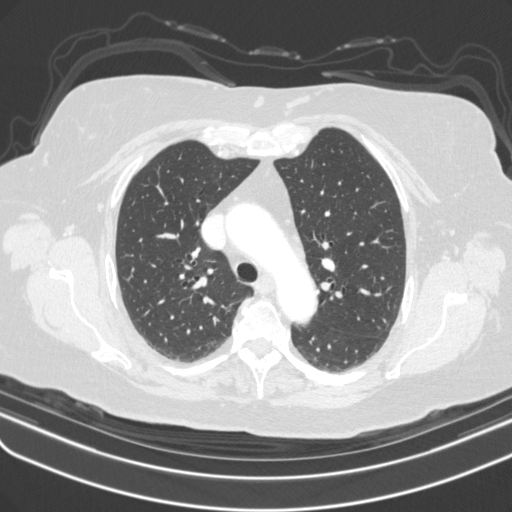
[im 322/403  lung]
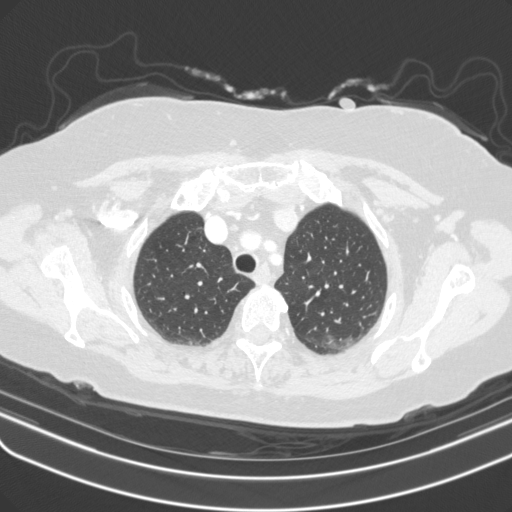
[im 342/403  lung]
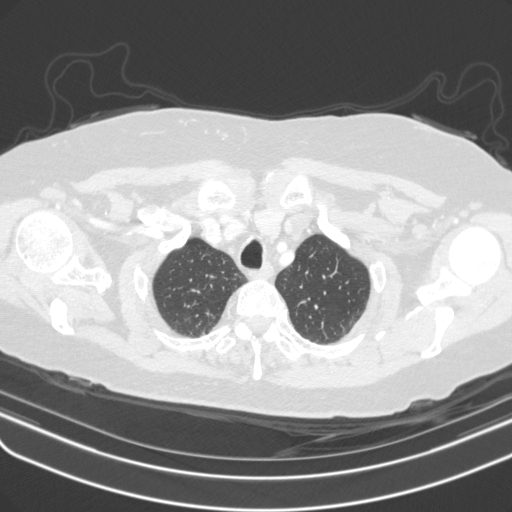
[im 382/403  lung]
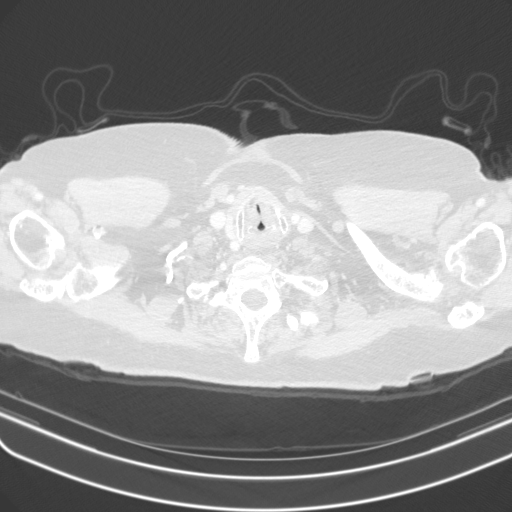

[15 of 36 positions shown; findings below may reference images not displayed]

FINDINGS: Cardiovascular: Aortic atherosclerosis. Tortuous thoracic aorta.
Borderline cardiomegaly. No pericardial effusion.

Mediastinum/Nodes: No supraclavicular adenopathy. No mediastinal or
hilar adenopathy.

Lungs/Pleura: No pleural fluid.  Clear lungs.

Upper Abdomen: A too small to characterize right hepatic lobe lesion
likely the cyst. Subcapsular low-density splenic lesion is similar
at 1.4 cm compared to [TB] and of no clinical significance. Status
post gastric bypass. Normal imaged portions of the gallbladder,
pancreas, biliary tract, adrenal glands, left kidney.

Musculoskeletal: No acute osseous abnormality. The radiographic
marker is identified about the anterior medial left chest wall on
image 31/series 2. No underlying solid or cystic mass.
IMPRESSION: 1. No acute process or correlate for the palpable abnormality in the
anterior left chest.
2.  Aortic atherosclerosis.

## 2016-11-04 MED ORDER — IOPAMIDOL (ISOVUE-300) INJECTION 61%
75.0000 mL | Freq: Once | INTRAVENOUS | Status: AC | PRN
  Administered 2016-11-04: 75 mL via INTRAVENOUS

## 2016-11-05 ENCOUNTER — Ambulatory Visit (INDEPENDENT_AMBULATORY_CARE_PROVIDER_SITE_OTHER): Payer: Medicare Other | Admitting: Interventional Cardiology

## 2016-11-05 ENCOUNTER — Encounter: Payer: Self-pay | Admitting: Interventional Cardiology

## 2016-11-05 VITALS — BP 116/68 | HR 79 | Ht 66.0 in | Wt 226.8 lb

## 2016-11-05 DIAGNOSIS — I2729 Other secondary pulmonary hypertension: Secondary | ICD-10-CM

## 2016-11-05 DIAGNOSIS — G4733 Obstructive sleep apnea (adult) (pediatric): Secondary | ICD-10-CM

## 2016-11-05 DIAGNOSIS — Q245 Malformation of coronary vessels: Secondary | ICD-10-CM

## 2016-11-05 DIAGNOSIS — I639 Cerebral infarction, unspecified: Secondary | ICD-10-CM | POA: Diagnosis not present

## 2016-11-05 DIAGNOSIS — R609 Edema, unspecified: Secondary | ICD-10-CM

## 2016-11-05 NOTE — Progress Notes (Signed)
Cardiology Office Note    Date:  11/05/2016   ID:  [name], DOB [birth year], MRN [id number]  PCP:  Janice Gravel, MD  Cardiologist: Janice Grooms, MD   Chief Complaint  Patient presents with  . Shortness of Breath    History of Present Illness:  [name] is a 70 y.o. female with a history of obstructive sleep apnea, anomalous origin of the right coronary, secondary pulmonary hypertension, lower extremity swelling, recent CVA.  The patient has undergone successful bariatric surgery. She most recently has been evaluated following strokes and was have a Loop recorder placed but this was delayed because of the presence of an apparent mass in the upper left breast. This is not been evaluated by ultrasound and CT with no findings to suggest cancer or a mass.  The patient has been seeing Dr. Lamonte [name]. There have been evaluating pulmonary hypertension. A cardiac echocardiogram suggested pulmonary artery systolic pressure of 40 mmHg.  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  Past Medical History:  Diagnosis Date  . 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 Arthritis    generalized  . Asthma   . Atrophic vaginitis   . Back pain    DDD/stenosis  . Carotid stenosis    Carotid US 10/16: Plaque RICA (0-10%), normal LICA  . Depression    takes Cymbalta daily  . Diverticulosis    benign  . DJD (degenerative joint disease)   . Dyslipidemia   . Dysrhythmia   . Family history of GI bleeding   . GERD (gastroesophageal reflux disease)    takes Nexium daily  . Gestational diabetes   . H/O hiatal hernia    surgery for hernia  . Headache(784.0)    takes Imitrex daily as needed and Bisoprolol daily;last migraine was about 2wks ago  . History of bronchitis 1 yr ago  . History of shingles   . Insomnia    takes Ambien nightly  . Joint pain   . Joint swelling   . Leg cramps    takes  Flexeril daily as needed  . 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 Nocturia   . Osteoporosis   . Peripheral neuropathy (HCC)    takes Gabapentin daily  . Pneumonia 24yrs ago   hx of  . Restless leg syndrome    takes Requip daily  . RLS (restless legs syndrome) 08/16/2015  . Sleep apnea    uses CPAP  . Tubular adenoma of colon   . Vertigo   . Vitamin D deficiency     Past Surgical History:  Procedure Laterality Date  . COLONOSCOPY    . ESOPHAGOGASTRODUODENOSCOPY    . excess skin removal     post weight loss  . GASTRIC BYPASS  2001  . HERNIA REPAIR     umbilical  . JOINT REPLACEMENT Right    knee  . skin reduction  2003 2004   stomach and arm  . STEROID INJECTION TO SCAR     x 2 in back   . TOTAL KNEE ARTHROPLASTY Left 04/03/2015   Procedure: LEFT TOTAL KNEE ARTHROPLASTY;  Surgeon: Melrose Nakayama, MD;  Location: [facility];  Service: Orthopedics;  Laterality: Left;  . WISDOM TOOTH EXTRACTION      Current Medications: Outpatient Medications Prior to Visit  Medication Sig Dispense Refill  . amphetamine-dextroamphetamine (ADDERALL) 10 MG tablet Take 10 mg by  mouth daily with breakfast.   0  . bisoprolol (ZEBETA) 10 MG tablet Take 10 mg by mouth 2 (two) times daily.    . calcium carbonate (OS-CAL) 1250 (500 CA) MG chewable tablet Chew 1 tablet by mouth daily. Not sure of dosage    . DULoxetine (CYMBALTA) 60 MG capsule Take 60 mg by mouth 2 (two) times daily.  11  . esomeprazole (NEXIUM) 40 MG capsule Take 40 mg by mouth 2 (two) times daily before a meal.   6  . Evolocumab (REPATHA SURECLICK) 401 MG/ML SOAJ Inject 1 Dose into the skin every 14 (fourteen) days. Every other week     . Loratadine (CLARITIN) 10 MG CAPS Take 1 capsule by mouth as needed (for allergies).     Marland Kitchen LYRICA 100 MG capsule Take 1 capsule (100 mg total) by mouth every evening. 90 capsule 3  . ondansetron (ZOFRAN ODT) 4 MG disintegrating tablet Take 1 tablet (4 mg total) by mouth every 8 (eight) hours as needed for  nausea or vomiting. 45 tablet 11  . pregabalin (LYRICA) 50 MG capsule Take 1 capsule (50 mg total) by mouth daily. 180 capsule 3  . PROAIR HFA 108 (90 Base) MCG/ACT inhaler 2 INHALATIONS EVERY 4 HOURS AS NEEDED FOR ASTHMA SYMPTOMS (WHEEZING/SHORTNESS OF BREATH)  5  . quiNINE (QUALAQUIN) 324 MG capsule Take 1 capsule (324 mg total) by mouth 2 (two) times daily. 180 capsule 3  . rOPINIRole (REQUIP) 1 MG tablet Take 1 tablet (1 mg total) by mouth 2 (two) times daily before lunch and supper. 180 tablet 3  . spironolactone (ALDACTONE) 25 MG tablet Take 1 tablet (25 mg total) by mouth 2 (two) times daily. 60 tablet 11  . topiramate (TOPAMAX) 100 MG tablet Take 2 tablets (200 mg total) by mouth at bedtime. 60 tablet 11  . Vitamin D, Ergocalciferol, (DRISDOL) 50000 units CAPS capsule Take 1 capsule (50,000 Units total) by mouth every 7 (seven) days. 12 capsule 4  . zolpidem (AMBIEN) 10 MG tablet Take 1 tablet (10 mg total) by mouth at bedtime. 30 tablet 4  . baclofen (LIORESAL) 10 MG tablet Take 1 tablet (10 mg total) by mouth 3 (three) times daily. (Patient not taking: Reported on 11/05/2016) 90 each 12  . diclofenac sodium (VOLTAREN) 1 % GEL Apply 2 g topically 4 (four) times daily. (Patient not taking: Reported on 11/05/2016) 100 g 12  . furosemide (LASIX) 20 MG tablet Take two tablet (40 mg) once daily (Patient not taking: Reported on 11/05/2016)     No facility-administered medications prior to visit.      Allergies:   Patient has no known allergies.   Social History   Social History  . Marital status: Married    Spouse name: [name]  . Number of children: 3  . Years of education: college   Occupational History  . Retired    Social History Main Topics  . Smoking status: Never Smoker  . Smokeless tobacco: Never Used  . Alcohol use No  . Drug use: No  . Sexual activity: Yes    Birth control/ protection: Post-menopausal   Other Topics Concern  . None   Social History Narrative     Caffeine 8-10 cups daily.  (coffee 1 cup am, unsw tea all day long).      Family History:  The patient's family history includes Breast cancer in her maternal grandmother; CAD in her mother and paternal grandfather; Colon cancer in her father; Heart disease in  her mother; Hypertension in her mother; Neuropathy in her mother; Osteoarthritis in her mother.   ROS:   Please see the history of present illness.    She has mild dyspnea on exertion. No episodes of syncope. Still complains of lower extremity swelling. She is concerned by the recommendation for right heart cath by Dr. West Carbo. She has intermittent headaches, easy bruising, and snoring.  All other systems reviewed and are negative.   PHYSICAL EXAM:   VS:  BP 116/68   Pulse 79   Ht 5\' 6"  (1.676 m)   Wt 226 lb 12.8 oz (102.9 kg)   BMI 36.61 kg/m    GEN: Well nourished, well developed, in no acute distress. Morbid obesity.  HEENT: normal  Neck: no JVD, carotid bruits, or masses Cardiac: RRR; no murmurs, rubs, or gallops,no edema  Respiratory:  clear to auscultation bilaterally, normal work of breathing GI: soft, nontender, nondistended, + BS MS: no deformity or atrophy  Skin: warm and dry, no rash Neuro:  Alert and Oriented x 3, Strength and sensation are intact Psych: euthymic mood, full affect  Wt Readings from Last 3 Encounters:  11/05/16 226 lb 12.8 oz (102.9 kg)  10/23/16 234 lb (106.1 kg)  09/26/16 226 lb (102.5 kg)      Studies/Labs Reviewed:   EKG:  EKG  The most recent tracing was performed in August 2017 and demonstrates normal sinus rhythm with normal appearance.  Recent Labs: No results found for requested labs within last 8760 hours.   Lipid Panel    Component Value Date/Time   CHOL 223 (H) 02/27/2016 1001   TRIG 143 02/27/2016 1001   HDL 48 02/27/2016 1001   CHOLHDL 4.6 (H) 02/27/2016 1001   LDLCALC 146 (H) 02/27/2016 1001    Additional studies/ records that were reviewed today include:    Echocardiogram 03/20/2016:  Study Conclusions  - Left ventricle: The cavity size was normal. Wall thickness was   normal. Systolic function was normal. The estimated ejection   fraction was in the range of 60% to 65%. Wall motion was normal;   there were no regional wall motion abnormalities. - Aortic valve: Structurally normal valve. Trileaflet; normal   thickness leaflets. Valve area (VTI): 0.98 cm^2. Valve area   (Vmax): 1.38 cm^2. Valve area (Vmean): 1.26 cm^2. - Tricuspid valve: There was mild regurgitation. - Pulmonary arteries: PA peak pressure: 40 mm Hg (S).  Impressions:  - The right ventricular systolic pressure was increased consistent   with moderate pulmonary hypertension.  ASSESSMENT:    1. Other secondary pulmonary hypertension   2. Obstructive sleep apnea   3. Cerebrovascular accident (CVA), unspecified mechanism ([place])   4. Peripheral edema   5. Congenital anomaly of coronary artery      PLAN:  In order of problems listed above:  1. PA systolic pressure estimated to be 40 mmHg in April. On clinical exam does know evidence of pulmonary hypertension by auscultation, observation of neck veins, etc. Rather than right heart catheterization I will be in favor of repeating an echocardiogram. Hopefully PA pressure will be re-estimated and if the same or less I would not do invasive evaluation. If PA systolic pressures climbing, right heart catheterization will be scheduled. 2. Continue compliance with C Pap. 3. She has completed workup for the left upper breast mass. No mass was found. Loop recorder can now be placed. Will notify Dr. Caryl Comes. 4. I'm beginning to believe that lower extremity edema is due to venous insufficiency. Compression stockings are  making tremendous improvement.    Medication Adjustments/Labs and Tests Ordered: Current medicines are reviewed at length with the patient today.  Concerns regarding medicines are outlined above.  Medication changes,  Labs and Tests ordered today are listed in the Patient Instructions below. Patient Instructions  Medication Instructions:  None  Labwork: None  Testing/Procedures: Your physician has requested that you have an echocardiogram in April 2018. Echocardiography is a painless test that uses sound waves to create images of your heart. It provides your doctor with information about the size and shape of your heart and how well your heart's chambers and valves are working. This procedure takes approximately one hour. There are no restrictions for this procedure.    Follow-Up: Your physician wants you to follow-up in: 9-12 months with Dr. Tamala Julian.  You will receive a reminder letter in the mail two months in advance. If you don't receive a letter, please call our office to schedule the follow-up appointment.   Any Other Special Instructions Will Be Listed Below (If Applicable).     If you need a refill on your cardiac medications before your next appointment, please call your pharmacy.      Signed, Janice Grooms, MD  11/05/2016 1:29 PM    [facility] [place], [place], [place]  [address] Phone: [phone]; Fax: [phone]

## 2016-11-05 NOTE — Patient Instructions (Signed)
Medication Instructions:  None  Labwork: None  Testing/Procedures: Your physician has requested that you have an echocardiogram in April 2018. Echocardiography is a painless test that uses sound waves to create images of your heart. It provides your doctor with information about the size and shape of your heart and how well your heart's chambers and valves are working. This procedure takes approximately one hour. There are no restrictions for this procedure.    Follow-Up: Your physician wants you to follow-up in: 9-12 months with Dr. Tamala Julian.  You will receive a reminder letter in the mail two months in advance. If you don't receive a letter, please call our office to schedule the follow-up appointment.   Any Other Special Instructions Will Be Listed Below (If Applicable).     If you need a refill on your cardiac medications before your next appointment, please call your pharmacy.

## 2016-11-13 ENCOUNTER — Telehealth: Payer: Self-pay | Admitting: Internal Medicine

## 2016-11-13 ENCOUNTER — Encounter: Payer: Self-pay | Admitting: *Deleted

## 2016-11-13 NOTE — Telephone Encounter (Signed)
I spoke with the patient- she would like to schedule her LINQ implant for 11/26/16. I advised her I will arrange this and call back to confirm the time.  LINQ scheduled 12/13 at 8:30 am- patient to arrive at 7:30 am. I left a message for the patient to call back and confirm.

## 2016-11-13 NOTE — Telephone Encounter (Signed)
I left a message for the patient to call to try to r/s her LINQ implant.

## 2016-11-19 ENCOUNTER — Encounter (HOSPITAL_COMMUNITY)
Admission: RE | Disposition: A | Discharge status: Home / Self Care | Payer: Self-pay | Source: Ambulatory Visit | Attending: Internal Medicine

## 2016-11-19 ENCOUNTER — Ambulatory Visit (HOSPITAL_COMMUNITY)
Admission: RE | Admit: 2016-11-19 | Discharge: 2016-11-19 | Disposition: A | Discharge status: Home / Self Care | Payer: Medicare Other | Source: Ambulatory Visit | Attending: Internal Medicine | Admitting: Internal Medicine

## 2016-11-19 ENCOUNTER — Encounter (HOSPITAL_COMMUNITY): Payer: Self-pay | Admitting: Internal Medicine

## 2016-11-19 DIAGNOSIS — G4700 Insomnia, unspecified: Secondary | ICD-10-CM | POA: Insufficient documentation

## 2016-11-19 DIAGNOSIS — E559 Vitamin D deficiency, unspecified: Secondary | ICD-10-CM | POA: Insufficient documentation

## 2016-11-19 DIAGNOSIS — G4730 Sleep apnea, unspecified: Secondary | ICD-10-CM | POA: Insufficient documentation

## 2016-11-19 DIAGNOSIS — Z96653 Presence of artificial knee joint, bilateral: Secondary | ICD-10-CM | POA: Insufficient documentation

## 2016-11-19 DIAGNOSIS — F329 Major depressive disorder, single episode, unspecified: Secondary | ICD-10-CM | POA: Insufficient documentation

## 2016-11-19 DIAGNOSIS — I639 Cerebral infarction, unspecified: Secondary | ICD-10-CM

## 2016-11-19 DIAGNOSIS — Z79899 Other long term (current) drug therapy: Secondary | ICD-10-CM | POA: Insufficient documentation

## 2016-11-19 DIAGNOSIS — Z9884 Bariatric surgery status: Secondary | ICD-10-CM | POA: Diagnosis not present

## 2016-11-19 DIAGNOSIS — G2581 Restless legs syndrome: Secondary | ICD-10-CM | POA: Insufficient documentation

## 2016-11-19 DIAGNOSIS — Z9989 Dependence on other enabling machines and devices: Secondary | ICD-10-CM | POA: Insufficient documentation

## 2016-11-19 DIAGNOSIS — K219 Gastro-esophageal reflux disease without esophagitis: Secondary | ICD-10-CM | POA: Diagnosis not present

## 2016-11-19 DIAGNOSIS — Z8673 Personal history of transient ischemic attack (TIA), and cerebral infarction without residual deficits: Secondary | ICD-10-CM | POA: Insufficient documentation

## 2016-11-19 DIAGNOSIS — E785 Hyperlipidemia, unspecified: Secondary | ICD-10-CM | POA: Diagnosis not present

## 2016-11-19 DIAGNOSIS — G43909 Migraine, unspecified, not intractable, without status migrainosus: Secondary | ICD-10-CM | POA: Insufficient documentation

## 2016-11-19 DIAGNOSIS — G629 Polyneuropathy, unspecified: Secondary | ICD-10-CM | POA: Diagnosis not present

## 2016-11-19 HISTORY — PX: EP IMPLANTABLE DEVICE: SHX172B

## 2016-11-19 SURGERY — LOOP RECORDER INSERTION
Anesthesia: LOCAL

## 2016-11-19 MED ORDER — LIDOCAINE-EPINEPHRINE 1 %-1:100000 IJ SOLN
INTRAMUSCULAR | Status: DC | PRN
  Administered 2016-11-19: 10 mL

## 2016-11-19 MED ORDER — LIDOCAINE-EPINEPHRINE 1 %-1:100000 IJ SOLN
INTRAMUSCULAR | Status: AC
  Filled 2016-11-19: qty 1

## 2016-11-19 SURGICAL SUPPLY — 2 items
LOOP REVEAL LINQSYS (Prosthesis & Implant Heart) ×1 IMPLANT
PACK LOOP INSERTION (CUSTOM PROCEDURE TRAY) ×2 IMPLANT

## 2016-11-19 NOTE — H&P (Signed)
Patient Care Team: [name], MD as PCP - General (Internal Medicine) [name], MD as Consulting Physician (Gastroenterology)   HPI  [name] is a 70 y.o. female Admitted for Loop recorder insertion for Cryptogenic Stroke   4/17 30 day heart monitor  unrevealing for atrial fibrillation 4/17 Echocardiogram demonstrated normal LV function.   11/15 Myoview negative for ischemia  Past Medical History:  Diagnosis Date  . 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 Arthritis    generalized  . Asthma   . Atrophic vaginitis   . Back pain    DDD/stenosis  . Carotid stenosis    Carotid US 10/16: Plaque RICA (5-40%), normal LICA  . Depression    takes Cymbalta daily  . Diverticulosis    benign  . DJD (degenerative joint disease)   . Dyslipidemia   . Dysrhythmia   . Family history of GI bleeding   . GERD (gastroesophageal reflux disease)    takes Nexium daily  . Gestational diabetes   . H/O hiatal hernia    surgery for hernia  . Headache(784.0)    takes Imitrex daily as needed and Bisoprolol daily;last migraine was about 2wks ago  . History of bronchitis 1 yr ago  . History of shingles   . Insomnia    takes Ambien nightly  . Joint pain   . Joint swelling   . Leg cramps    takes Flexeril daily as needed  . 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 Nocturia   . Osteoporosis   . Peripheral neuropathy (HCC)    takes Gabapentin daily  . Pneumonia 36yrs ago   hx of  . Restless leg syndrome    takes Requip daily  . RLS (restless legs syndrome) 08/16/2015  . Sleep apnea    uses CPAP  . Tubular adenoma of colon   . Vertigo   . Vitamin D deficiency     Past Surgical History:  Procedure Laterality Date  . COLONOSCOPY    . ESOPHAGOGASTRODUODENOSCOPY    . excess skin removal     post weight loss  . GASTRIC BYPASS  2001  . HERNIA REPAIR     umbilical  . JOINT REPLACEMENT Right    knee  . skin reduction  2003 2004   stomach and arm  . STEROID  INJECTION TO SCAR     x 2 in back   . TOTAL KNEE ARTHROPLASTY Left 04/03/2015   Procedure: LEFT TOTAL KNEE ARTHROPLASTY;  Surgeon: Melrose Nakayama, MD;  Location: [facility];  Service: Orthopedics;  Laterality: Left;  . WISDOM TOOTH EXTRACTION      No current facility-administered medications for this encounter.     No Known Allergies No current facility-administered medications on file prior to encounter.    Current Outpatient Prescriptions on File Prior to Encounter  Medication Sig Dispense Refill  . amphetamine-dextroamphetamine (ADDERALL) 10 MG tablet Take 10 mg by mouth daily with breakfast.   0  . baclofen (LIORESAL) 10 MG tablet Take 10 mg by mouth 2 (two) times daily.    . bisoprolol (ZEBETA) 10 MG tablet Take 10 mg by mouth 2 (two) times daily.    . calcium carbonate (OS-CAL) 1250 (500 CA) MG chewable tablet Chew 1 tablet by mouth daily. Not sure of dosage    . diclofenac sodium (VOLTAREN) 1 % GEL Apply 2 g topically 4 (four) times daily as needed (pain).    . DULoxetine (CYMBALTA)  60 MG capsule Take 60 mg by mouth 2 (two) times daily.  11  . esomeprazole (NEXIUM) 40 MG capsule Take 40 mg by mouth 2 (two) times daily before a meal.   6  . Evolocumab (REPATHA SURECLICK) 656 MG/ML SOAJ Inject 1 Dose into the skin every 14 (fourteen) days. Every other week     . furosemide (LASIX) 20 MG tablet Take 40 mg by mouth daily.    . Loratadine (CLARITIN) 10 MG CAPS Take 1 capsule by mouth as needed (for allergies).     Marland Kitchen LYRICA 100 MG capsule Take 1 capsule (100 mg total) by mouth every evening. 90 capsule 3  . ondansetron (ZOFRAN ODT) 4 MG disintegrating tablet Take 1 tablet (4 mg total) by mouth every 8 (eight) hours as needed for nausea or vomiting. 45 tablet 11  . pregabalin (LYRICA) 50 MG capsule Take 1 capsule (50 mg total) by mouth daily. 180 capsule 3  . PROAIR HFA 108 (90 Base) MCG/ACT inhaler 2 INHALATIONS EVERY 4 HOURS AS NEEDED FOR ASTHMA SYMPTOMS (WHEEZING/SHORTNESS OF BREATH)  5  .  quiNINE (QUALAQUIN) 324 MG capsule Take 1 capsule (324 mg total) by mouth 2 (two) times daily. 180 capsule 3  . rOPINIRole (REQUIP) 1 MG tablet Take 1 tablet (1 mg total) by mouth 2 (two) times daily before lunch and supper. 180 tablet 3  . spironolactone (ALDACTONE) 25 MG tablet Take 1 tablet (25 mg total) by mouth 2 (two) times daily. 60 tablet 11  . topiramate (TOPAMAX) 100 MG tablet Take 2 tablets (200 mg total) by mouth at bedtime. 60 tablet 11  . Vitamin D, Ergocalciferol, (DRISDOL) 50000 units CAPS capsule Take 1 capsule (50,000 Units total) by mouth every 7 (seven) days. 12 capsule 4  . zolpidem (AMBIEN) 10 MG tablet Take 1 tablet (10 mg total) by mouth at bedtime. 30 tablet 4      Review of Systems negative except from HPI and PMH  Physical Exam BP (!) 148/63   Pulse 60   Temp 97.7 F (36.5 C) (Oral)   Resp 18   Ht 5\' 6"  (1.676 m)   Wt 220 lb (99.8 kg)   SpO2 100%   BMI 35.51 kg/m  Well developed and nourished in no acute distress HENT normal Neck supple with JVP-flat Clear Regular rate and rhythm, no murmurs or gallops Abd-soft with active BS No Clubbing cyanosis edema Skin-warm and dry A & Oriented  Grossly normal sensory and motor function     Assessment and  Plan  Cryptogenic Stroke  For loop recorder insertion

## 2016-11-19 NOTE — Discharge Instructions (Signed)
See hand out.

## 2016-11-27 ENCOUNTER — Ambulatory Visit (INDEPENDENT_AMBULATORY_CARE_PROVIDER_SITE_OTHER): Payer: Medicare Other | Admitting: Neurology

## 2016-11-27 ENCOUNTER — Ambulatory Visit (INDEPENDENT_AMBULATORY_CARE_PROVIDER_SITE_OTHER): Payer: Medicare Other | Admitting: *Deleted

## 2016-11-27 VITALS — BP 133/73 | HR 72

## 2016-11-27 DIAGNOSIS — G43711 Chronic migraine without aura, intractable, with status migrainosus: Secondary | ICD-10-CM | POA: Diagnosis not present

## 2016-11-27 DIAGNOSIS — Z95818 Presence of other cardiac implants and grafts: Secondary | ICD-10-CM

## 2016-11-27 DIAGNOSIS — I639 Cerebral infarction, unspecified: Secondary | ICD-10-CM

## 2016-11-27 LAB — CUP PACEART INCLINIC DEVICE CHECK
Date Time Interrogation Session: 20171214141357
MDC IDC PG IMPLANT DT: 20171206

## 2016-11-27 NOTE — Progress Notes (Signed)
Wound check appointment. Steri-strips previously removed by patient. Wound without redness or edema. Incision edges approximated, wound well healed. Normal device function. Battery status: good. R-waves 0.20mV. No symptom, tachy, pause, brady, or AF episodes. Pause and brady detection reprogrammed off (no hx of syncope). Monthly summary reports and ROV with SK PRN.

## 2016-11-27 NOTE — Progress Notes (Signed)
Botox-100unitsx2 vials Lot: [id number] Expiration: 05/2019 NDC: 9326-7124-58 [id number]  Lot: [id number] Expiration: 05/2019 NDC: 3419-3790-24 [id number]  0.9% Sodium Chloride bacteriostatic- 81mL total Lot: [id number] Expiration: 05/15/2018 NDC: 4268-3419-62  Dx: I29.798 B/B

## 2016-12-01 ENCOUNTER — Encounter: Payer: Self-pay | Admitting: Neurology

## 2016-12-01 ENCOUNTER — Ambulatory Visit (INDEPENDENT_AMBULATORY_CARE_PROVIDER_SITE_OTHER): Payer: Medicare Other | Admitting: Neurology

## 2016-12-01 VITALS — BP 132/70 | HR 75 | Ht 66.0 in | Wt 229.4 lb

## 2016-12-01 DIAGNOSIS — G243 Spasmodic torticollis: Secondary | ICD-10-CM | POA: Diagnosis not present

## 2016-12-01 DIAGNOSIS — I639 Cerebral infarction, unspecified: Secondary | ICD-10-CM

## 2016-12-01 NOTE — Progress Notes (Signed)
[facility]    Provider:  Dr [name] Referring Provider: [name], MD Primary Care Physician:  [name], MD  CC: Chronic migraines, cervical dystonia, occipital neuralgia, stroke  Interval history 12/01/2016: Patient has improved with botox but her shoulder still hurts. She has tightness and pain and decreased ROM. Massage helps. Discussed different botox, dysport often helps more with pain may try that instead at next appointment.  Interval history:  Complete stroke up was unrevealing. MRI showed Two small wedge-shaped foci in the left hemisphere consistent with prior small ischemic strokes. As both involve the gray matter and juxtacortical white matter, consider an embolic etiology. Echo showed pulmonary HTN and she has been referred to pulmonology. stroke workup including carotid dopplers, 30-day heart monitor, echo, mra of the brain was unrevealing, she needs evaluation for loop recorder will refer to Dr. Joylene Grapes.Started daily asa 81mg  and zetia for HLD (cannot take statins). Will ask if Dr. Rayann Heman can request the new PCSK9 inhibitor, need cardiologist to get approved. Discussed with patient today, she is open to a loop recorder. She continues to have migraines and she has had 2 botox for migraine injections. She also has cervical dystonia and today we will perform botox for cervical dystonia.  Interval history 03/17/2016: She is on Topiramate. Still having migraines. She has 20 headaches a month or more and 9 are migrainous. Pain on the right side of the head. Light sensitivity, sound sensitivity, nausea no vomiting. Pounding, throbbing. On Topamax for migraines helping but still with significant headaches. She needs to follow up with cardiology for possible loop recorder. She sees Daneen Schick for cardiology will refer back to him for loop recorder. Migraines have not improved. Discussed stroke, seeing pulmonary doctor for new dx of pulmonary HTN. Appears she had testing for  RA which was neagtive.   Also significant pain in one muscle of the right side of the neck. This has been going on for years without relief and is slowly progressively getting worse. She has significant pain, decreased ROM which is chronic for over one year. She gets regular massage. She's been to physical therapy. We have performed multiple trigger point injections with Depo-Medrol, lidocaine and Marcaine. She has tried baclofen, Cymbalta, oxycodone, Lyrica, gabapentin, Robaxin without any relief.   She ls also having pain radiating from the right side of the neck to the occipital area. Difficult to differentiate from migraines but appears to be different.   Medications tried include; Topamax, b-blocker,multiple occipital and cervical trigger point injections with Depo-Medrol, lidocaine and Marcaine. She has tried baclofen, Cymbalta, oxycodone, Lyrica, gabapentin, Robaxin without any relief.    Interval history 02/28/2016; Patient returns to discuss MRI of the brain with possible embolic strokes. Discussed with patient and husband. Reviewed all images with patient and husband, explained image results. Discussed strokes, types, evaluation. Will perform a stroke workup including carotid dopplers, 30-day heart monitor, echo, mra of the brain.Started daily asa 81mg  and zetia for HLD (cannot take statins). May need a loop recorder.    IMPRESSION: This MRI of the brain with and without contrast shows the following: 1. Two small wedge-shaped foci in the left hemisphere consistent with prior small ischemic strokes. As both involve the gray matter and juxtacortical white matter, consider an embolic etiology 2. Chronic microvascular ischemic change elsewhere, typical extent for age. 3. Cortical atrophy, more than expected for age. 4. There is a normal enhancement pattern and there are no acute findings.  Original HPI: [name] is a 70  y.o. female here for evaluation of headaches as a  second opinion from Dr. Brett Fairy. She has had neck pain for 7-8 months. Startes in the muscle and radiates up and to the back right of the head. She has had massage and they work hard on it and it helps. She has pain in the back of the head. She has been told it is muscular. Pain on movement of the head. She went to an ENT. Can't figure what it is. She put voltaren gel it helps. Heat and stretching helps. She went to physical therapy. She has decreased ROM to the right.   Headaches worsening the last 6 months. She reports Vision changes with migraines. She has migraines. No aura. Started when she was a teenager, she has always had them. No FHx. Headaches start on the right. More throbbing pain on the bilateral temples and the back of the right head. The painful neck exacerbates it. She has nausea. She has had vomiting. She takes phenergan. She has to go into a dark room, sounds bothers her. She is having them daily for the last 6 months. Before then better controlled. She has 30 headache days a month, 15 migrainous a month. They can last all day long. No medication overuse. She has pain all over, leg pain, knee pain and she is managed by a pain clinic and uses oxycodone. She was on Topamax in the past. Tried topamax in the past. She has failed amitriptyline. Tried Toradol, tried imitrex, tried cymbalta.   Reviewed outside physician, imaging and lab reports:  Bmp with slightly dec gfr 54, creat 1.04 03/2015  10/2015: CT soft tissue of the neck 10/2015 personally reviewe dimages: Skeleton: Cervical disc and facet degeneration. Mild anterior slip C3-4, C4-5, and C5-6 due to disc and facet degeneration. No fracture or mass lesion.  Upper chest: Lung apices are clear.  IMPRESSION: Negative for mass or adenopathy in the neck. No acute abnormality  Review of Systems: Patient complains of symptoms per HPI as well as the following symptoms: murmur, swelling in legs, joint pain. Pertinent  negatives per HPI. All others negative.  Social History   Social History  . Marital status: Married    Spouse name: Pilar Jarvis  . Number of children: 3  . Years of education: college   Occupational History  . Retired    Social History Main Topics  . Smoking status: Never Smoker  . Smokeless tobacco: Never Used  . Alcohol use No  . Drug use: No  . Sexual activity: Yes    Birth control/ protection: Post-menopausal   Other Topics Concern  . Not on file   Social History Narrative   Caffeine 8-10 cups daily.  (coffee 1 cup am, unsw tea all day long).     Family History  Problem Relation Age of Onset  . CAD Mother   . Hypertension Mother   . Neuropathy Mother   . Osteoarthritis Mother   . Heart disease Mother   . Breast cancer Maternal Grandmother   . CAD Paternal Grandfather   . Colon cancer Father     Past Medical History:  Diagnosis Date  . 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 Arthritis    generalized  . Asthma   . Atrophic vaginitis   . Back pain    DDD/stenosis  . Carotid stenosis    Carotid US 10/16: Plaque RICA (4-58%), normal LICA  . Depression    takes Cymbalta daily  .  Diverticulosis    benign  . DJD (degenerative joint disease)   . Dyslipidemia   . Dysrhythmia   . Family history of GI bleeding   . GERD (gastroesophageal reflux disease)    takes Nexium daily  . Gestational diabetes   . H/O hiatal hernia    surgery for hernia  . Headache(784.0)    takes Imitrex daily as needed and Bisoprolol daily;last migraine was about 2wks ago  . History of bronchitis 1 yr ago  . History of shingles   . Insomnia    takes Ambien nightly  . Joint pain   . Joint swelling   . Leg cramps    takes Flexeril daily as needed  . 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 Nocturia   . Osteoporosis   . Peripheral neuropathy (HCC)    takes Gabapentin daily  . Pneumonia 5yrs ago   hx of  . Restless leg syndrome    takes Requip daily  . RLS  (restless legs syndrome) 08/16/2015  . Sleep apnea    uses CPAP  . Tubular adenoma of colon   . Vertigo   . Vitamin D deficiency     Past Surgical History:  Procedure Laterality Date  . COLONOSCOPY    . EP IMPLANTABLE DEVICE N/A 11/19/2016   Procedure: Loop Recorder Insertion;  Surgeon: Deboraha Sprang, MD;  Location: [facility];  Service: Cardiovascular;  Laterality: N/A;  . ESOPHAGOGASTRODUODENOSCOPY    . excess skin removal     post weight loss  . GASTRIC BYPASS  2001  . HERNIA REPAIR     umbilical  . JOINT REPLACEMENT Right    knee  . skin reduction  2003 2004   stomach and arm  . STEROID INJECTION TO SCAR     x 2 in back   . TOTAL KNEE ARTHROPLASTY Left 04/03/2015   Procedure: LEFT TOTAL KNEE ARTHROPLASTY;  Surgeon: Melrose Nakayama, MD;  Location: [facility];  Service: Orthopedics;  Laterality: Left;  . WISDOM TOOTH EXTRACTION      Current Outpatient Prescriptions  Medication Sig Dispense Refill  . amphetamine-dextroamphetamine (ADDERALL) 10 MG tablet Take 10 mg by mouth daily with breakfast.   0  . baclofen (LIORESAL) 10 MG tablet Take 10 mg by mouth 2 (two) times daily.    . bisoprolol (ZEBETA) 10 MG tablet Take 10 mg by mouth 2 (two) times daily.    . calcium carbonate (OS-CAL) 1250 (500 CA) MG chewable tablet Chew 1 tablet by mouth daily. Not sure of dosage    . diclofenac sodium (VOLTAREN) 1 % GEL Apply 2 g topically 4 (four) times daily as needed (pain).    . DULoxetine (CYMBALTA) 60 MG capsule Take 60 mg by mouth 2 (two) times daily.  11  . esomeprazole (NEXIUM) 40 MG capsule Take 40 mg by mouth 2 (two) times daily before a meal.   6  . Evolocumab (REPATHA SURECLICK) 379 MG/ML SOAJ Inject 1 Dose into the skin every 14 (fourteen) days. Every other week     . furosemide (LASIX) 20 MG tablet Take 40 mg by mouth daily.    . Loratadine (CLARITIN) 10 MG CAPS Take 1 capsule by mouth as needed (for allergies).     Marland Kitchen LYRICA 100 MG capsule Take 1 capsule (100 mg total) by  mouth every evening. 90 capsule 3  . ondansetron (ZOFRAN ODT) 4 MG disintegrating tablet Take 1 tablet (4 mg total) by mouth every 8 (eight) hours as needed for  nausea or vomiting. 45 tablet 11  . pregabalin (LYRICA) 50 MG capsule Take 1 capsule (50 mg total) by mouth daily. 180 capsule 3  . PROAIR HFA 108 (90 Base) MCG/ACT inhaler 2 INHALATIONS EVERY 4 HOURS AS NEEDED FOR ASTHMA SYMPTOMS (WHEEZING/SHORTNESS OF BREATH)  5  . quiNINE (QUALAQUIN) 324 MG capsule Take 1 capsule (324 mg total) by mouth 2 (two) times daily. 180 capsule 3  . rOPINIRole (REQUIP) 1 MG tablet Take 1 tablet (1 mg total) by mouth 2 (two) times daily before lunch and supper. 180 tablet 3  . spironolactone (ALDACTONE) 25 MG tablet Take 1 tablet (25 mg total) by mouth 2 (two) times daily. 60 tablet 11  . topiramate (TOPAMAX) 100 MG tablet Take 2 tablets (200 mg total) by mouth at bedtime. 60 tablet 11  . Vitamin D, Ergocalciferol, (DRISDOL) 50000 units CAPS capsule Take 1 capsule (50,000 Units total) by mouth every 7 (seven) days. 12 capsule 4  . zolpidem (AMBIEN) 10 MG tablet Take 1 tablet (10 mg total) by mouth at bedtime. 30 tablet 4   No current facility-administered medications for this visit.     Allergies as of 12/01/2016  . (No Known Allergies)    Vitals: There were no vitals taken for this visit. Last Weight:  Wt Readings from Last 1 Encounters:  11/19/16 220 lb (99.8 kg)   Last Height:   Ht Readings from Last 1 Encounters:  11/19/16 5\' 6"  (1.676 m)       Physical exam: Exam: Gen: NAD, conversant, well nourised, obese, well groomed  CV: RRR, no MRG. No Carotid Bruits. No peripheral edema, warm, nontender Eyes: Conjunctivae clear without exudates or hemorrhage MSK: Elevated right shoulder, right laterocollis and right torticollis, hypertrophied right trapezius, tender and hypertrophied semispinalis capitus muscle.   Neuro: Detailed Neurologic Exam  Speech:  Speech is normal;  fluent and spontaneous with normal comprehension.  Cognition:  The patient is oriented to person, place, and time;   recent and remote memory intact;   language fluent;   normal attention, concentration,   fund of knowledge Cranial Nerves:  The pupils are equal, round, and reactive to light. The fundi are normal. Visual fields are full to finger confrontation. Extraocular movements are intact. Trigeminal sensation is intact and the muscles of mastication are normal. The face is symmetric. The palate elevates in the midline. Hearing intact. Voice is normal. Shoulder shrug is normal. The tongue has normal motion without fasciculations.    Assessment/Plan: 70 year old female with chronic migraines, embolic looking strokes on MRI of the brain  Migraine: ContinueTopiramate for migraine and promethazine for nausea and vomiting during migraine. Botox for migraine. Headaches improved by at least 50% in frequency and severity  Cervical Dystonia:Patient has cervical dystonia of the right side. She has significant pain, decreased ROM which is chronic for over one year. She gets regular massage. She's been to physical therapy. We have performed multiple trigger point injections with Depo-Medrol, lidocaine and Marcaine. She has tried baclofen, Cymbalta, oxycodone, Lyrica, gabapentin, Robaxin without any relief. Patient would benefit from ongoing Warner therapy for her decreased range of motion, progressive continued neck pain. We'll request approval for possible change to dysport for cervical dystonia injections. She has significant improved bu continues to have pain.  Remote stroke:  stroke workup including carotid dopplers, 30-day heart monitor, echo, loop, mra of the brain was unrevealing. Started daily asa 81mg  and zetia for HLD (cannot take statins). Will ask if Dr. Rayann Heman can request the  new PCSK9 inhibitor, need cardiologist to get approved.   I had a long d/w patient about  her recent stroke, risk for recurrent stroke/TIAs, personally independently reviewed imaging studies and stroke evaluation results and answered questions.Start ASA 81 for secondary stroke prevention and maintain strict control of hypertension with blood pressure goal below 130/90, diabetes with hemoglobin A1c goal below 6.5% and lipids with LDL cholesterol goal below 70 mg/dL.Patient has h/o statin intolerance hence recommend Zetia and pcsk9 inhibitor if possible . I also advised the patient to eat a healthy diet with plenty of whole grains, cereals, fruits and vegetables, exercise regularly and maintain ideal body weight.  Sarina Ill, MD  [facility]  [address] [place]  [place], [place] [address]  Phone [phone] Fax [phone]    A total of 30 minutes was spent face-to-face with this patient. Over half this time was spent on counseling patient on the embolic stroke, cervical dystonia diagnosis and different diagnostic and therapeutic options available.

## 2016-12-01 NOTE — Patient Instructions (Signed)
Remember to drink plenty of fluid, eat healthy meals and do not skip any meals. Try to eat protein with a every meal and eat a healthy snack such as fruit or nuts in between meals. Try to keep a regular sleep-wake schedule and try to exercise daily, particularly in the form of walking, 20-30 minutes a day, if you can.   My clinical assistant and will answer any of your questions and relay your messages to me and also relay most of my messages to you.   Our phone number is [phone]. We also have an after hours call service for urgent matters and there is a physician on-call for urgent questions. For any emergencies you know to call 911 or go to the nearest emergency room

## 2016-12-01 NOTE — Progress Notes (Signed)
Consent Form Botulism Toxin Injection For Chronic Migraine  Botulism toxin has been approved by the Federal drug administration for treatment of chronic migraine. Botulism toxin does not cure chronic migraine and it may not be effective in some patients.  The administration of botulism toxin is accomplished by injecting a small amount of toxin into the muscles of the neck and head. Dosage must be titrated for each individual. Any benefits resulting from botulism toxin tend to wear off after 3 months with a repeat injection required if benefit is to be maintained. Injections are usually done every 3-4 months with maximum effect peak achieved by about 2 or 3 weeks. Botulism toxin is expensive and you should be sure of what costs you will incur resulting from the injection.  The side effects of botulism toxin use for chronic migraine may include:   -Transient, and usually mild, facial weakness with facial injections  -Transient, and usually mild, head or neck weakness with head/neck injections  -Reduction or loss of forehead facial animation due to forehead muscle              weakness  -Eyelid drooping  -Dry eye  -Pain at the site of injection or bruising at the site of injection  -Double vision  -Potential unknown long term risks  Contraindications: You should not have Botox if you are pregnant, nursing, allergic to albumin, have an infection, skin condition, or muscle weakness at the site of the injection, or have myasthenia gravis, Lambert-Eaton syndrome, or ALS.  It is also possible that as with any injection, there may be an allergic reaction or no effect from the medication. Reduced effectiveness after repeated injections is sometimes seen and rarely infection at the injection site may occur. All care will be taken to prevent these side effects. If therapy is given over a long time, atrophy and wasting in the muscle injected may occur. Occasionally the patient's become refractory to  treatment because they develop antibodies to the toxin. In this event, therapy needs to be modified.  I have read the above information and consent to the administration of botulism toxin.    ______________  _____   _________________  Patient signature     Date   Witness signature       BOTOX PROCEDURE NOTE FOR MIGRAINE HEADACHE    Contraindications and precautions discussed with patient(above). Aseptic procedure was observed and patient tolerated procedure. Procedure performed by Dr. Georgia Dom  The condition has existed for more than 6 months, and pt does not have a diagnosis of ALS, Myasthenia Gravis or Lambert-Eaton Syndrome. Risks and benefits of injections discussed and pt agrees to proceed with the procedure. Written consent obtained  These injections are medically necessary. He receives good benefits from these injections. These injections do not cause sedations or hallucinations which the oral therapies may cause.  Indication/Diagnosis: chronic migraine BOTOX(J0585) injection was performed according to protocol by [employer]. 200 units of BOTOX was dissolved into 4 cc NS.  NDC: [address]-54  Type of toxin: Botox Botox-100unitsx2 vials Lot: [id number] Expiration: 05/2019 NDC: 0938-1829-93 [id number]  Lot: [id number] Expiration: 05/2019 NDC: 0258-5277-82 [id number]  0.9% Sodium Chloride bacteriostatic- 55mL total Lot: [id number] Expiration: 05/15/2018 NDC: 1540-0867-61  Dx: P50.932 B/B    Description of procedure:  The patient was placed in a sitting position. The standard protocol was used for Botox as follows, with 5 units of Botox injected at each site:   -Procerus muscle, midline injection  -Corrugator muscle, bilateral injection  -Frontalis muscle,  bilateral injection, with 2 sites each side, medial injection was performed in the upper one third of the frontalis muscle, in the region vertical from the medial inferior edge of the superior orbital rim.  The lateral injection was again in the upper one third of the forehead vertically above the lateral limbus of the cornea, 1.5 cm lateral to the medial injection site.  -Temporalis muscle injection, 4 sites, bilaterally. The first injection was 3 cm above the tragus of the ear, second injection site was 1.5 cm to 3 cm up from the first injection site in line with the tragus of the ear. The third injection site was 1.5-3 cm forward between the first 2 injection sites. The fourth injection site was 1.5 cm posterior to the second injection site.  -Occipitalis muscle injection, 3 sites, bilaterally. The first injection was done one half way between the occipital protuberance and the tip of the mastoid process behind the ear. The second injection site was done lateral and superior to the first, 1 fingerbreadth from the first injection. The third injection site was 1 fingerbreadth superiorly and medially from the first injection site.  -Cervical paraspinal muscle injection, 2 sites, bilateral knee first injection site was 1 cm from the midline of the cervical spine, 3 cm inferior to the lower border of the occipital protuberance. The second injection site was 1.5 cm superiorly and laterally to the first injection site.  -Trapezius muscle injection was performed at 3 sites, bilaterally. The first injection site was in the upper trapezius muscle halfway between the inflection point of the neck, and the acromion. The second injection site was one half way between the acromion and the first injection site. The third injection was done between the first injection site and the inflection point of the neck.   Will return for repeat injection in 3 months.   A 200 unit sof Botox was used, 155 units were injected, the rest of the Botox was wasted. The patient tolerated the procedure well, there were no complications of the above procedure.

## 2016-12-10 ENCOUNTER — Ambulatory Visit: Payer: Medicare Other

## 2016-12-19 ENCOUNTER — Ambulatory Visit (INDEPENDENT_AMBULATORY_CARE_PROVIDER_SITE_OTHER): Payer: Medicare Other | Admitting: *Deleted

## 2016-12-19 DIAGNOSIS — I639 Cerebral infarction, unspecified: Secondary | ICD-10-CM | POA: Diagnosis not present

## 2016-12-19 NOTE — Progress Notes (Signed)
Carelink Summary Report / Loop Recorder 

## 2017-01-03 ENCOUNTER — Other Ambulatory Visit: Payer: Self-pay | Admitting: Neurology

## 2017-01-03 DIAGNOSIS — R1115 Cyclical vomiting syndrome unrelated to migraine: Secondary | ICD-10-CM

## 2017-01-03 DIAGNOSIS — G43001 Migraine without aura, not intractable, with status migrainosus: Secondary | ICD-10-CM

## 2017-01-05 NOTE — Telephone Encounter (Signed)
Called and spoke to [place]. Advised 45 tablets should last patient 30 days.  He will make a note in patient's chart. She has 10 more refills on rx zofran ODT 4mg . He said to disregard rx refill request. She picked up 45 tablets on 01/03/17.

## 2017-01-16 DIAGNOSIS — D649 Anemia, unspecified: Secondary | ICD-10-CM | POA: Diagnosis not present

## 2017-01-16 DIAGNOSIS — E559 Vitamin D deficiency, unspecified: Secondary | ICD-10-CM | POA: Diagnosis not present

## 2017-01-16 DIAGNOSIS — I10 Essential (primary) hypertension: Secondary | ICD-10-CM | POA: Diagnosis not present

## 2017-01-16 DIAGNOSIS — E538 Deficiency of other specified B group vitamins: Secondary | ICD-10-CM | POA: Diagnosis not present

## 2017-01-16 DIAGNOSIS — I639 Cerebral infarction, unspecified: Secondary | ICD-10-CM | POA: Diagnosis not present

## 2017-01-19 ENCOUNTER — Ambulatory Visit (INDEPENDENT_AMBULATORY_CARE_PROVIDER_SITE_OTHER): Payer: Medicare Other | Admitting: *Deleted

## 2017-01-19 DIAGNOSIS — I639 Cerebral infarction, unspecified: Secondary | ICD-10-CM | POA: Diagnosis not present

## 2017-01-19 NOTE — Progress Notes (Signed)
Carelink Summary Report / Loop Recorder 

## 2017-01-22 ENCOUNTER — Ambulatory Visit: Payer: Medicare Other | Admitting: Neurology

## 2017-01-23 DIAGNOSIS — E559 Vitamin D deficiency, unspecified: Secondary | ICD-10-CM | POA: Diagnosis not present

## 2017-01-23 DIAGNOSIS — I10 Essential (primary) hypertension: Secondary | ICD-10-CM | POA: Diagnosis not present

## 2017-01-23 DIAGNOSIS — L570 Actinic keratosis: Secondary | ICD-10-CM | POA: Diagnosis not present

## 2017-01-23 DIAGNOSIS — Z0000 Encounter for general adult medical examination without abnormal findings: Secondary | ICD-10-CM | POA: Diagnosis not present

## 2017-01-23 DIAGNOSIS — I639 Cerebral infarction, unspecified: Secondary | ICD-10-CM | POA: Diagnosis not present

## 2017-01-26 ENCOUNTER — Telehealth: Payer: Self-pay

## 2017-01-26 ENCOUNTER — Telehealth: Payer: Self-pay | Admitting: Neurology

## 2017-01-26 ENCOUNTER — Ambulatory Visit (INDEPENDENT_AMBULATORY_CARE_PROVIDER_SITE_OTHER): Payer: Medicare Other | Admitting: Neurology

## 2017-01-26 DIAGNOSIS — G243 Spasmodic torticollis: Secondary | ICD-10-CM | POA: Diagnosis not present

## 2017-01-26 DIAGNOSIS — R1115 Cyclical vomiting syndrome unrelated to migraine: Secondary | ICD-10-CM

## 2017-01-26 DIAGNOSIS — G43001 Migraine without aura, not intractable, with status migrainosus: Secondary | ICD-10-CM

## 2017-01-26 DIAGNOSIS — M542 Cervicalgia: Secondary | ICD-10-CM

## 2017-01-26 DIAGNOSIS — G248 Other dystonia: Secondary | ICD-10-CM

## 2017-01-26 MED ORDER — LYRICA 100 MG PO CAPS: 100.0000 mg | ORAL_CAPSULE | Freq: Two times a day (BID) | ORAL | 11 refills | Status: DC

## 2017-01-26 MED ORDER — DICLOFENAC SODIUM 1 % TD GEL: 2.0000 g | Freq: Four times a day (QID) | TRANSDERMAL | 11 refills | Status: DC | PRN

## 2017-01-26 MED ORDER — DICLOFENAC POTASSIUM(MIGRAINE) 50 MG PO PACK: 50.0000 mg | PACK | Freq: Every evening | ORAL | 11 refills | Status: DC | PRN

## 2017-01-26 MED ORDER — ONDANSETRON 4 MG PO TBDP: 4.0000 mg | ORAL_TABLET | Freq: Three times a day (TID) | ORAL | 11 refills | Status: DC | PRN

## 2017-01-26 NOTE — Telephone Encounter (Signed)
Faxed to [facility] # [phone].

## 2017-01-26 NOTE — Telephone Encounter (Signed)
Hinton Dyer, this patient has [employer] and medicare coverage. Would you call and see if [facility] takes medicare? I don;t think they do but do you mind asking? Thanks!

## 2017-01-26 NOTE — Progress Notes (Addendum)
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 She has significant pain, decreased ROM which is chronic for over one year. She gets regular massage. We have performed multiple trigger point injections with Depo-Medrol, lidocaine and Marcaine. She has tried baclofen, Cymbalta, oxycodone, Lyrica, gabapentin, Robaxin without any relief. Patient would benefit from Physical therapy for stretching, strengthening, manual therapy and massage as well as dry needling of the right trapezius muscle and any other muscles o modalities as clinically warranted by physical therapy evaluation   Procedure note for Spasmodic torticollis:  EMG: EMG guidance was used to inject muscles detailed below. Aseptic procedure was performed and patient tolerated procedure. Procedure was performed by Dr. Myrla Halsted   Patient tolerated the procedure.   Refractory to oral medications   Motrin / tylenol for injections site pain / soreness   REMS precautions handout given to patient   RTC - see instructions for details   Wasted 0 Units.   Dysport-500 units J0586   NDC [address]-0 Lot [id number] Exp 06/13/2017   0.9% Sodium Chloride- 105mL total [id number] Expiration: 04/2018 NDC: 02111-552-08  DX: G24.3 [employer] and [employer]  Muscles injected: 80 right levator scapula(prox and distal), 260 right trapezius, 80 right semispinalis capitus, 80 right splenius capitus

## 2017-01-26 NOTE — Telephone Encounter (Signed)
Patient enrollment form completed for [facility], awaiting MD signature.

## 2017-01-29 NOTE — Telephone Encounter (Signed)
Patient referral form completed, awaiting MD signature.

## 2017-01-29 NOTE — Telephone Encounter (Signed)
Signed and given to [facility].

## 2017-01-29 NOTE — Telephone Encounter (Signed)
Thank you :)

## 2017-01-29 NOTE — Telephone Encounter (Signed)
Called and spoke to [facility] they do not take her insurance.  1. 1st visit $105.00  2. visits after $80.00.  Called and spoke to patient and she still wants to go and be self pay.  Dr. [name] please fill out paper work and I will send. Thanks Hinton Dyer.

## 2017-02-01 LAB — CUP PACEART REMOTE DEVICE CHECK
Implantable Pulse Generator Implant Date: 20171206
MDC IDC SESS DTM: 20180105134248

## 2017-02-01 NOTE — Progress Notes (Signed)
Carelink summary report received. Battery status OK. Normal device function. No new symptom episodes, tachy episodes, brady, or pause episodes. No new AF episodes. Monthly summary reports and ROV/PRN 

## 2017-02-02 ENCOUNTER — Ambulatory Visit: Payer: Medicare Other | Attending: Neurology

## 2017-02-02 DIAGNOSIS — M436 Torticollis: Secondary | ICD-10-CM

## 2017-02-02 DIAGNOSIS — R252 Cramp and spasm: Secondary | ICD-10-CM | POA: Diagnosis not present

## 2017-02-02 DIAGNOSIS — R293 Abnormal posture: Secondary | ICD-10-CM | POA: Diagnosis not present

## 2017-02-02 DIAGNOSIS — M542 Cervicalgia: Secondary | ICD-10-CM | POA: Diagnosis not present

## 2017-02-02 DIAGNOSIS — G243 Spasmodic torticollis: Secondary | ICD-10-CM | POA: Diagnosis not present

## 2017-02-02 DIAGNOSIS — M6281 Muscle weakness (generalized): Secondary | ICD-10-CM | POA: Diagnosis not present

## 2017-02-02 NOTE — Therapy (Addendum)
[place], [place], [address] Phone: [phone]   Fax:  [phone]  Physical Therapy Treatment  Patient Details  Name: [name] MRN: [id number] Date of Birth: [birth year] Referring Provider: Sarina Ill, MD  Encounter Date: 02/02/2017      PT End of Session - 02/02/17 1309    Visit Number 1   Number of Visits 12   Date for PT Re-Evaluation 03/13/17   Authorization Type Medicare   PT Start Time 1233   PT Stop Time 1316   PT Time Calculation (min) 43 min   Activity Tolerance Patient tolerated treatment well;No increased pain   Behavior During Therapy WFL for tasks assessed/performed      Past Medical History:  Diagnosis Date  . 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 Arthritis    generalized  . Asthma   . Atrophic vaginitis   . Back pain    DDD/stenosis  . Carotid stenosis    Carotid US 10/16: Plaque RICA (4-03%), normal LICA  . Depression    takes Cymbalta daily  . Diverticulosis    benign  . DJD (degenerative joint disease)   . Dyslipidemia   . Dysrhythmia   . Family history of GI bleeding   . GERD (gastroesophageal reflux disease)    takes Nexium daily  . Gestational diabetes   . H/O hiatal hernia    surgery for hernia  . Headache(784.0)    takes Imitrex daily as needed and Bisoprolol daily;last migraine was about 2wks ago  . History of bronchitis 1 yr ago  . History of shingles   . Insomnia    takes Ambien nightly  . Joint pain   . Joint swelling   . Leg cramps    takes Flexeril daily as needed  . 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 Nocturia   . Osteoporosis   . Peripheral neuropathy (HCC)    takes Gabapentin daily  . Pneumonia 59yrs ago   hx of  . Restless leg syndrome    takes Requip daily  . RLS (restless legs syndrome) 08/16/2015  . Sleep apnea    uses CPAP  . Tubular adenoma of colon   . Vertigo   . Vitamin D deficiency     Past Surgical  History:  Procedure Laterality Date  . COLONOSCOPY    . EP IMPLANTABLE DEVICE N/A 11/19/2016   Procedure: Loop Recorder Insertion;  Surgeon: Deboraha Sprang, MD;  Location: [facility];  Service: Cardiovascular;  Laterality: N/A;  . ESOPHAGOGASTRODUODENOSCOPY    . excess skin removal     post weight loss  . GASTRIC BYPASS  2001  . HERNIA REPAIR     umbilical  . JOINT REPLACEMENT Right    knee  . skin reduction  2003 2004   stomach and arm  . STEROID INJECTION TO SCAR     x 2 in back   . TOTAL KNEE ARTHROPLASTY Left 04/03/2015   Procedure: LEFT TOTAL KNEE ARTHROPLASTY;  Surgeon: Melrose Nakayama, MD;  Location: [facility];  Service: Orthopedics;  Laterality: Left;  . WISDOM TOOTH EXTRACTION      There were no vitals filed for this visit.      Subjective Assessment - 02/02/17 1236    Subjective she reports  neck pain and shoulders, migraines. MD felt she would benefit from PT.   Much conservative treatment without benefit. Worked as Arboriculturist. now retired. Pain mainly  on RT side.    Limitations Walking;House hold activities  decreased balance, difficult to do home tasks but does them.  turn head to back car , look around   How long can you sit comfortably?  30 min incr pain   How long can you stand comfortably? not  sure   How long can you walk comfortably? one block or more, balance issue   Patient Stated Goals She wants to feel better , turn head , walk , improve balance   Currently in Pain? Yes   Pain Score 6    Pain Location Neck  and shoulder   Pain Orientation Right   Pain Descriptors / Indicators Aching;Sore   Pain Type Chronic pain   Pain Onset More than a month ago   Pain Frequency Constant   Aggravating Factors  turning neck.   all activity.    Pain Relieving Factors cream volatrin, tylenol , heat (sleeps with heat)   Multiple Pain Sites --  Other areas in pain but not assessed            [facility] PT Assessment - 02/02/17 0001      Assessment   Medical  Diagnosis cervicalgia  and cervical dystonia   Referring Provider Sarina Ill, MD   Onset Date/Surgical Date --  > 1 year   Next MD Visit 3 months   Prior Therapy No     Precautions   Precautions None     Restrictions   Weight Bearing Restrictions No     Balance Screen   Has the patient fallen in the past 6 months Yes   How many times? 3  Loss of balance/tripping , sat wrong on chair   Has the patient had a decrease in activity level because of a fear of falling?  Yes   Is the patient reluctant to leave their home because of a fear of falling?  No     Home Environment   Living Environment Private residence   Living Arrangements Spouse/significant other   Type of [place] to enter   Entrance Stairs-Number of Steps 5   Entrance Stairs-Rails Right   [place] One level     Prior Function   Level of Independence Independent     Cognition   Overall Cognitive Status Within Functional Limits for tasks assessed     Posture/Postural Control   Posture Comments forwrd head and rounded shoulders. spinal scoliosis with LT shoulder more posterior and RT shoulder lower than LT      ROM / Strength   AROM / PROM / Strength AROM;Strength     AROM   Overall AROM Comments Shoulder overhead active decreased but assisted WNL   AROM Assessment Site Cervical   Cervical Flexion 30   Cervical Extension 20   Cervical - Right Side Bend 20   Cervical - Left Side Bend 20   Cervical - Right Rotation 42   Cervical - Left Rotation 38     Strength   Overall Strength Comments WNL both UE except RT shoulder flexion/abduction and ER 4/5     Ambulation/Gait   Gait velocity 3 feet per second     Standardized Balance Assessment   Standardized Balance Assessment Berg Balance Test     Berg Balance Test   Sit to Stand Able to stand  independently using hands   Standing Unsupported Able to stand safely 2 minutes   Sitting with Back Unsupported but Feet Supported on  Floor  or Stool Able to sit safely and securely 2 minutes   Stand to Sit Sits safely with minimal use of hands   Transfers Able to transfer safely, definite need of hands   Standing Unsupported with Eyes Closed Able to stand 10 seconds safely   Standing Ubsupported with Feet Together Able to place feet together independently and stand for 1 minute with supervision   From Standing, Reach Forward with Outstretched Arm Can reach forward >12 cm safely (5")   From Standing Position, Pick up Object from [place] to pick up shoe safely and easily   From Standing Position, Turn to Look Behind Over each Shoulder Looks behind one side only/other side shows less weight shift   Turn 360 Degrees Able to turn 360 degrees safely in 4 seconds or less   Standing Unsupported, Alternately Place Feet on Step/Stool Able to stand independently and safely and complete 8 steps in 20 seconds   Standing Unsupported, One Foot in Front Able to plae foot ahead of the other independently and hold 30 seconds   Standing on One Leg Able to lift leg independently and hold > 10 seconds   Total Score 50                             PT Education - 02/02/17 1309    Education provided Yes   Education Details POC and results of BERG test(possible benefits of SPC use) and findings   Person(s) Educated Patient   Methods Explanation   Comprehension Verbalized understanding          PT Short Term Goals - 02/02/17 1324      PT SHORT TERM GOAL #1   Title She will be independent with inital HEP   Time 2   Period Weeks   Status New     PT SHORT TERM GOAL #2   Title She will report pain decr 25 for general activity at home    Time 3   Period Weeks   Status New     PT SHORT TERM GOAL #3   Title She will improve cervical rotation to 50 degrees bilaterally to see behind in car   Time 3   Period Weeks   Status New     PT SHORT TERM GOAL #4   Title she will demo understanding of good posture   Time 3    Period Weeks   Status New           PT Long Term Goals - 02/02/17 1326      PT LONG TERM GOAL #1   Title She will be independent with all HEP issued    Time 6   Period Weeks   Status New     PT LONG TERM GOAL #2   Title She will report pain as intermittant in RT neck   Time 6   Period Weeks   Status New     PT LONG TERM GOAL #3   Title She will improve BERG score to 54/56 to show improveent in balance.    Time 6   Period Weeks   Status New     PT LONG TERM GOAL #4   Title She will report able to see traffic with 50% decreased pain    Time 6   Period Weeks   Status New               Plan -  2017/02/21 1330    Clinical Impression Statement Ms Ang presents for moderate complexity eval with chronic RT sided neck pain , weakness RT shoulder, scoliosis with abnormal posture  of spine and decreased balance making turning and looking behind for traffic She has also had some falls though BERG was in range that [facility] may not be needed but with scoliosis this may add to decr dynamic stability.    Rehab Potential Good   PT Frequency 2x / week   PT Duration 6 weeks   PT Treatment/Interventions Iontophoresis 4mg /ml Dexamethasone;Moist Heat;Traction;Ultrasound;Passive range of motion;Patient/family education;Taping;Dry needling;Therapeutic exercise;Therapeutic activities;Manual techniques;Balance training   PT Next Visit Plan strengthening for posture and strength and manual /modalities for pain   Consulted and Agree with Plan of Care Patient      Patient will benefit from skilled therapeutic intervention in order to improve the following deficits and impairments:  Postural dysfunction, Decreased strength, Decreased activity tolerance, Decreased balance, Pain, Increased muscle spasms, Decreased range of motion, Difficulty walking  Visit Diagnosis: Cervical dystonia - Plan: PT plan of care cert/re-cert  Cervicalgia - Plan: PT plan of care cert/re-cert  Stiffness of cervical  spine - Plan: PT plan of care cert/re-cert  Abnormal posture - Plan: PT plan of care cert/re-cert  Cramp and spasm - Plan: PT plan of care cert/re-cert  Muscle weakness (generalized) - Plan: PT plan of care cert/re-cert       G-Codes - 2017-02-21 1534    Functional Assessment Tool Used Clinical judgement   Functional Limitation Other PT primary   Changing and Maintaining Body Position Current Status (G8916) At least 40 percent but less than 60 percent impaired, limited or restricted   Changing and Maintaining Body Position Goal Status (X4503) At least 20 percent but less than 40 percent impaired, limited or restricted      Problem List Patient Active Problem List   Diagnosis Date Noted  . Torticollis, acquired 10/23/2016  . Asthma 08/04/2016  . Other secondary pulmonary hypertension 06/11/2016  . 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 Chronic migraine without aura without status migrainosus, not intractable 03/19/2016  . History of gastric bypass 12/05/2015  . RLS (restless legs syndrome) 08/16/2015  . Vitamin B12 deficiency anemia due to intrinsic factor deficiency 08/16/2015  . Iron deficiency anemia 08/16/2015  . Osteoporosis 08/16/2015  . Primary osteoarthritis of left knee 04/03/2015  . Obstructive sleep apnea 10/19/2014  . 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 Hyperlipidemia 10/19/2014  . Morbid obesity ([place]) 10/19/2014    Darrel Hoover  PT 2017-02-21, 3:37 PM  [facility] [address] [place], [place], [address] Phone: [phone]   Fax:  [phone]  Name: [name] MRN: [id number] Date of Birth: 1946-02-11

## 2017-02-04 ENCOUNTER — Ambulatory Visit: Payer: Medicare Other | Admitting: Physical Therapy

## 2017-02-04 DIAGNOSIS — R293 Abnormal posture: Secondary | ICD-10-CM

## 2017-02-04 DIAGNOSIS — M6281 Muscle weakness (generalized): Secondary | ICD-10-CM

## 2017-02-04 DIAGNOSIS — G243 Spasmodic torticollis: Secondary | ICD-10-CM

## 2017-02-04 DIAGNOSIS — M542 Cervicalgia: Secondary | ICD-10-CM | POA: Diagnosis not present

## 2017-02-04 DIAGNOSIS — M436 Torticollis: Secondary | ICD-10-CM | POA: Diagnosis not present

## 2017-02-04 DIAGNOSIS — R252 Cramp and spasm: Secondary | ICD-10-CM

## 2017-02-04 NOTE — Therapy (Signed)
[place], [place], [address] Phone: [phone]   Fax:  [phone]  Physical Therapy Treatment  Patient Details  Name: [name] MRN: [id number] Date of Birth: [birth year] Referring Provider: Sarina Ill, MD  Encounter Date: 02/04/2017      PT End of Session - 02/04/17 1300    Visit Number 2   Number of Visits 12   Date for PT Re-Evaluation 03/13/17   Authorization Type Medicare   PT Start Time 1255   PT Stop Time 1345   PT Time Calculation (min) 50 min      Past Medical History:  Diagnosis Date  . 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 Arthritis    generalized  . Asthma   . Atrophic vaginitis   . Back pain    DDD/stenosis  . Carotid stenosis    Carotid US 10/16: Plaque RICA (6-64%), normal LICA  . Depression    takes Cymbalta daily  . Diverticulosis    benign  . DJD (degenerative joint disease)   . Dyslipidemia   . Dysrhythmia   . Family history of GI bleeding   . GERD (gastroesophageal reflux disease)    takes Nexium daily  . Gestational diabetes   . H/O hiatal hernia    surgery for hernia  . Headache(784.0)    takes Imitrex daily as needed and Bisoprolol daily;last migraine was about 2wks ago  . History of bronchitis 1 yr ago  . History of shingles   . Insomnia    takes Ambien nightly  . Joint pain   . Joint swelling   . Leg cramps    takes Flexeril daily as needed  . 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 Nocturia   . Osteoporosis   . Peripheral neuropathy (HCC)    takes Gabapentin daily  . Pneumonia 38yrs ago   hx of  . Restless leg syndrome    takes Requip daily  . RLS (restless legs syndrome) 08/16/2015  . Sleep apnea    uses CPAP  . Tubular adenoma of colon   . Vertigo   . Vitamin D deficiency     Past Surgical History:  Procedure Laterality Date  . COLONOSCOPY    . EP IMPLANTABLE DEVICE N/A 11/19/2016   Procedure: Loop Recorder Insertion;   Surgeon: Deboraha Sprang, MD;  Location: [facility];  Service: Cardiovascular;  Laterality: N/A;  . ESOPHAGOGASTRODUODENOSCOPY    . excess skin removal     post weight loss  . GASTRIC BYPASS  2001  . HERNIA REPAIR     umbilical  . JOINT REPLACEMENT Right    knee  . skin reduction  2003 2004   stomach and arm  . STEROID INJECTION TO SCAR     x 2 in back   . TOTAL KNEE ARTHROPLASTY Left 04/03/2015   Procedure: LEFT TOTAL KNEE ARTHROPLASTY;  Surgeon: Melrose Nakayama, MD;  Location: [facility];  Service: Orthopedics;  Laterality: Left;  . WISDOM TOOTH EXTRACTION      There were no vitals filed for this visit.      Subjective Assessment - 02/04/17 1306    Subjective I have headache. I dont know that stretching is going to help me.    Currently in Pain? Yes   Pain Score 7    Pain Location Neck   Pain Orientation Right;Left;Lower;Posterior   Pain Descriptors / Indicators Sore   Aggravating Factors  moving  head                          [facility] Adult PT Treatment/Exercise - 02/04/17 0001      Shoulder Exercises: Supine   Other Supine Exercises supine yellow bad horizontal abduction, pullovers, ER x 10 each      Shoulder Exercises: Pulleys   Flexion 2 minutes     Manual Therapy   Manual Therapy Soft tissue mobilization   Soft tissue mobilization sub occipitals cervical paraspinals, upper, middle traps.                 PT Education - 02/04/17 1316    Education provided Yes   Education Details HEP    Person(s) Educated Patient   Methods Explanation;Handout   Comprehension Verbalized understanding          PT Short Term Goals - 02/02/17 1324      PT SHORT TERM GOAL #1   Title She will be independent with inital HEP   Time 2   Period Weeks   Status New     PT SHORT TERM GOAL #2   Title She will report pain decr 25 for general activity at home    Time 3   Period Weeks   Status New     PT SHORT TERM GOAL #3   Title She will improve  cervical rotation to 50 degrees bilaterally to see behind in car   Time 3   Period Weeks   Status New     PT SHORT TERM GOAL #4   Title she will demo understanding of good posture   Time 3   Period Weeks   Status New           PT Long Term Goals - 02/02/17 1326      PT LONG TERM GOAL #1   Title She will be independent with all HEP issued    Time 6   Period Weeks   Status New     PT LONG TERM GOAL #2   Title She will report pain as intermittant in RT neck   Time 6   Period Weeks   Status New     PT LONG TERM GOAL #3   Title She will improve BERG score to 54/56 to show improveent in balance.    Time 6   Period Weeks   Status New     PT LONG TERM GOAL #4   Title She will report able to see traffic with 50% decreased pain    Time 6   Period Weeks   Status New               Plan - 02/04/17 1315    Clinical Impression Statement Pt reports posterior neck and headache at 7/10. In supine she has no pain and is able to tolerate supine scap stab exercises without pain. Began manual to sub occipitals and cervical paraspinasub occipitals. Instructed pt in self suboccipital release with tennis balls.    PT Next Visit Plan CHECK HEP/ SELF SUB OCCIPITAL RELEASE ; strengthening for posture and strength and manual /modalities for pain   PT Home Exercise Plan Supine scap stabilization yellow band    Consulted and Agree with Plan of Care Patient      Patient will benefit from skilled therapeutic intervention in order to improve the following deficits and impairments:  Postural dysfunction, Decreased strength, Decreased activity tolerance, Decreased balance, Pain, Increased muscle spasms,  Decreased range of motion, Difficulty walking  Visit Diagnosis: Cervical dystonia  Cervicalgia  Stiffness of cervical spine  Abnormal posture  Cramp and spasm  Muscle weakness (generalized)     Problem List Patient Active Problem List   Diagnosis Date Noted  . Torticollis,  acquired 10/23/2016  . Asthma 08/04/2016  . Other secondary pulmonary hypertension 06/11/2016  . 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 Chronic migraine without aura without status migrainosus, not intractable 03/19/2016  . History of gastric bypass 12/05/2015  . RLS (restless legs syndrome) 08/16/2015  . Vitamin B12 deficiency anemia due to intrinsic factor deficiency 08/16/2015  . Iron deficiency anemia 08/16/2015  . Osteoporosis 08/16/2015  . Primary osteoarthritis of left knee 04/03/2015  . Obstructive sleep apnea 10/19/2014  . 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 Hyperlipidemia 10/19/2014  . Morbid obesity ([place]) 10/19/2014    Dorene Ar, PTA 02/04/2017, 1:40 PM  [facility] [address] [place], [place], [address] Phone: [phone]   Fax:  [phone]  Name: [name] MRN: [id number] Date of Birth: 1946-04-06

## 2017-02-04 NOTE — Patient Instructions (Signed)
Over Head Pull: Narrow Grip       On back, knees bent, feet flat, band across thighs, elbows straight but relaxed. Pull hands apart (start). Keeping elbows straight, bring arms up and over head, hands toward floor. Keep pull steady on band. Hold momentarily. Return slowly, keeping pull steady, back to start. Repeat _10__ times. Band color __Y____   Side Pull: Double Arm   On back, knees bent, feet flat. Arms perpendicular to body, shoulder level, elbows straight but relaxed. Pull arms out to sides, elbows straight. Resistance band comes across collarbones, hands toward floor. Hold momentarily. Slowly return to starting position. Repeat _10__ times. Band color _Y____   Sash   On back, knees bent, feet flat, left hand on left hip, right hand above left. Pull right arm DIAGONALLY (hip to shoulder) across chest. Bring right arm along head toward floor. Hold momentarily. Slowly return to starting position. Repeat __10_ times. Do with left arm. Band color ___Y___   Shoulder Rotation: Double Arm   On back, knees bent, feet flat, elbows tucked at sides, bent 90, hands palms up. Pull hands apart and down toward floor, keeping elbows near sides. Hold momentarily. Slowly return to starting position. Repeat __10_ times. Band color ___Y___   

## 2017-02-05 NOTE — Telephone Encounter (Signed)
Spoke to [place] re: denial of Cambia d/t non-formulary. Reports that pt either has to try 2 of the 4 formulary meds: fenoprofen, nabumetone flurbiprofen, ibuprofen    Or may try Cambia if MD feels that any of the 4 formulary meds would not be effective for pt.

## 2017-02-05 NOTE — Telephone Encounter (Signed)
[place] [phone] called reg denial letter. Please call

## 2017-02-08 NOTE — Telephone Encounter (Signed)
Let he know we have to try one other medication before cambia can be approved. She has tried ibuprofen. We can try flurbiprofen 100mg  tid as needed at onset of headache. Disp 20 refill 4. This is an anti-inflammatory so take with food and watch for gi upset. Please discuss with her. After she tries this med we will reorder cambia next month and hopefully insurance will approve!

## 2017-02-09 ENCOUNTER — Ambulatory Visit: Payer: Medicare Other

## 2017-02-09 ENCOUNTER — Other Ambulatory Visit: Payer: Self-pay | Admitting: Neurology

## 2017-02-09 DIAGNOSIS — M436 Torticollis: Secondary | ICD-10-CM | POA: Diagnosis not present

## 2017-02-09 DIAGNOSIS — M542 Cervicalgia: Secondary | ICD-10-CM

## 2017-02-09 DIAGNOSIS — R293 Abnormal posture: Secondary | ICD-10-CM | POA: Diagnosis not present

## 2017-02-09 DIAGNOSIS — M6281 Muscle weakness (generalized): Secondary | ICD-10-CM | POA: Diagnosis not present

## 2017-02-09 DIAGNOSIS — G243 Spasmodic torticollis: Secondary | ICD-10-CM

## 2017-02-09 DIAGNOSIS — R252 Cramp and spasm: Secondary | ICD-10-CM | POA: Diagnosis not present

## 2017-02-09 MED ORDER — FLURBIPROFEN 100 MG PO TABS: 100.0000 mg | ORAL_TABLET | Freq: Three times a day (TID) | ORAL | 4 refills | Status: DC | PRN

## 2017-02-09 NOTE — Addendum Note (Signed)
Addended by: Monte Fantasia on: 02/09/2017 10:08 AM   Modules accepted: Orders

## 2017-02-09 NOTE — Telephone Encounter (Signed)
Called pt and relayed med info and education per Dr. [name] below. New rx for Ansaid e-scribed to pt's verified, local pharmacy. She verbalized understanding and appreciation for call.

## 2017-02-09 NOTE — Patient Instructions (Signed)
SLR standing 3 way with concentration on stance leg work 1x/day 10 reps  Also from cabinet scapula /shoulder elevation and depression working on RT shoulder elevation strength many x/day 2-5 reps

## 2017-02-09 NOTE — Therapy (Signed)
[place], [place], [address] Phone: [phone]   Fax:  [phone]  Physical Therapy Treatment  Patient Details  Name: [name] MRN: [id number] Date of Birth: 02/16/1946 Referring Provider: Sarina Ill, MD  Encounter Date: 02/09/2017      PT End of Session - 02/09/17 1227    Visit Number 3   Number of Visits 12   Date for PT Re-Evaluation 03/13/17   Authorization Type Medicare   PT Start Time 1225   PT Stop Time 1337   PT Time Calculation (min) 72 min   Activity Tolerance Patient tolerated treatment well   Behavior During Therapy [facility] for tasks assessed/performed      Past Medical History:  Diagnosis Date  . 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 Arthritis    generalized  . Asthma   . Atrophic vaginitis   . Back pain    DDD/stenosis  . Carotid stenosis    Carotid US 10/16: Plaque RICA (6-78%), normal LICA  . Depression    takes Cymbalta daily  . Diverticulosis    benign  . DJD (degenerative joint disease)   . Dyslipidemia   . Dysrhythmia   . Family history of GI bleeding   . GERD (gastroesophageal reflux disease)    takes Nexium daily  . Gestational diabetes   . H/O hiatal hernia    surgery for hernia  . Headache(784.0)    takes Imitrex daily as needed and Bisoprolol daily;last migraine was about 2wks ago  . History of bronchitis 1 yr ago  . History of shingles   . Insomnia    takes Ambien nightly  . Joint pain   . Joint swelling   . Leg cramps    takes Flexeril daily as needed  . 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 Nocturia   . Osteoporosis   . Peripheral neuropathy (HCC)    takes Gabapentin daily  . Pneumonia 74yr ago   hx of  . Restless leg syndrome    takes Requip daily  . RLS (restless legs syndrome) 08/16/2015  . Sleep apnea    uses CPAP  . Tubular adenoma of colon   . Vertigo   . Vitamin D deficiency     Past Surgical History:   Procedure Laterality Date  . COLONOSCOPY    . EP IMPLANTABLE DEVICE N/A 11/19/2016   Procedure: Loop Recorder Insertion;  Surgeon: SDeboraha Sprang MD;  [facility];  Service: Cardiovascular;  Laterality: N/A;  . ESOPHAGOGASTRODUODENOSCOPY    . excess skin removal     post weight loss  . GASTRIC BYPASS  2001  . HERNIA REPAIR     umbilical  . JOINT REPLACEMENT Right    knee  . skin reduction  2003 2004   stomach and arm  . STEROID INJECTION TO SCAR     x 2 in back   . TOTAL KNEE ARTHROPLASTY Left 04/03/2015   Procedure: LEFT TOTAL KNEE ARTHROPLASTY;  Surgeon: PMelrose Nakayama MD;  [place] [facility]  Service: Orthopedics;  Laterality: Left;  . WISDOM TOOTH EXTRACTION      There were no vitals filed for this visit.      Subjective Assessment - 02/09/17 1229    Subjective Feeling pretty good with no pain today.    Currently in Pain? No/denies  [facility] Adult PT Treatment/Exercise - 02/09/17 0001      Knee/Hip Exercises: Standing   Other Standing Knee Exercises x10 with pole support leg slides 3 way with weight to stance leg      Shoulder Exercises: Supine   Other Supine Exercises Reveiwed HEP and she was able to do correctly     Modalities   Modalities Moist Heat     Moist Heat Therapy   Number Minutes Moist Heat 15 Minutes   Moist Heat Location Lumbar Spine  sore from lying down     Manual Therapy   Soft tissue mobilization sub occipitals cervical paraspinals, upper, middle traps. and manual traction  x 10 for 20-25 se                PT Education - 02/09/17 1339    Education provided Yes   Education Details Discussed tai chi and arthtitis and balance exercises, standing single leg stand with moving opposit leg forward/back and side, also neck side glide to LT and  shoulder elevation for strength RT shoulder and depression reach LT     Person(s) Educated Patient   Methods Explanation;Demonstration;Verbal  cues;Handout   Comprehension Verbalized understanding;Returned demonstration          PT Short Term Goals - 02/02/17 1324      PT SHORT TERM GOAL #1   Title She will be independent with inital HEP   Time 2   Period Weeks   Status New     PT SHORT TERM GOAL #2   Title She will report pain decr 25 for general activity at home    Time 3   Period Weeks   Status New     PT SHORT TERM GOAL #3   Title She will improve cervical rotation to 50 degrees bilaterally to see behind in car   Time 3   Period Weeks   Status New     PT SHORT TERM GOAL #4   Title she will demo understanding of good posture   Time 3   Period Weeks   Status New           PT Long Term Goals - 02/02/17 1326      PT LONG TERM GOAL #1   Title She will be independent with all HEP issued    Time 6   Period Weeks   Status New     PT LONG TERM GOAL #2   Title She will report pain as intermittant in RT neck   Time 6   Period Weeks   Status New     PT LONG TERM GOAL #3   Title She will improve BERG score to 54/56 to show improveent in balance.    Time 6   Period Weeks   Status New     PT LONG TERM GOAL #4   Title She will report able to see traffic with 50% decreased pain    Time 6   Period Weeks   Status New               Plan - 02/09/17 1227    Clinical Impression Statement No pain today. Added to HEP for leg strngth/ba,ace, shoulder strength and posture  along with neck.  Manual traction felt good so will add mechanical and assess that next time. She is independent with HEP with band   PT Treatment/Interventions Iontophoresis 2m/ml Dexamethasone;Moist Heat;Traction;Ultrasound;Passive range of motion;Patient/family education;Taping;Dry needling;Therapeutic exercise;Therapeutic activities;Manual techniques;Balance training   PT Next Visit  Plan reveiew HEP , trial mech traction . Balance activity/ LE strength   PT Home Exercise Plan Supine scap stabilization yellow band    Consulted  and Agree with Plan of Care Patient      Patient will benefit from skilled therapeutic intervention in order to improve the following deficits and impairments:  Postural dysfunction, Decreased strength, Decreased activity tolerance, Decreased balance, Pain, Increased muscle spasms, Decreased range of motion, Difficulty walking  Visit Diagnosis: Cervical dystonia  Cervicalgia  Stiffness of cervical spine  Abnormal posture  Cramp and spasm  Muscle weakness (generalized)     Problem List Patient Active Problem List   Diagnosis Date Noted  . Torticollis, acquired 10/23/2016  . Asthma 08/04/2016  . Other secondary pulmonary hypertension 06/11/2016  . 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 Chronic migraine without aura without status migrainosus, not intractable 03/19/2016  . History of gastric bypass 12/05/2015  . RLS (restless legs syndrome) 08/16/2015  . Vitamin B12 deficiency anemia due to intrinsic factor deficiency 08/16/2015  . Iron deficiency anemia 08/16/2015  . Osteoporosis 08/16/2015  . Primary osteoarthritis of left knee 04/03/2015  . Obstructive sleep apnea 10/19/2014  . 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 Hyperlipidemia 10/19/2014  . Morbid obesity ([place]) 10/19/2014    Darrel Hoover  PT 02/09/2017, 1:55 PM  [facility] [address] [place], [place], [address] Phone: [phone]   Fax:  [phone]  Name: [name] MRN: [id number] Date of Birth: [birth year]

## 2017-02-12 ENCOUNTER — Ambulatory Visit: Payer: Medicare Other | Attending: Neurology

## 2017-02-12 DIAGNOSIS — R293 Abnormal posture: Secondary | ICD-10-CM

## 2017-02-12 DIAGNOSIS — M436 Torticollis: Secondary | ICD-10-CM

## 2017-02-12 DIAGNOSIS — G243 Spasmodic torticollis: Secondary | ICD-10-CM

## 2017-02-12 DIAGNOSIS — M542 Cervicalgia: Secondary | ICD-10-CM

## 2017-02-12 DIAGNOSIS — R252 Cramp and spasm: Secondary | ICD-10-CM | POA: Diagnosis not present

## 2017-02-12 DIAGNOSIS — M6281 Muscle weakness (generalized): Secondary | ICD-10-CM | POA: Insufficient documentation

## 2017-02-12 NOTE — Therapy (Signed)
[place], [place], [address] Phone: [phone]   Fax:  [phone]  Physical Therapy Treatment  Patient Details  Name: [name] MRN: [id number] Date of Birth: 12-Oct-1946 Referring Provider: Sarina Ill, MD  Encounter Date: 02/12/2017      PT End of Session - 02/12/17 1148    Visit Number 4   Number of Visits 12   Date for PT Re-Evaluation 03/13/17   Authorization Type Medicare   PT Start Time 3664   PT Stop Time 1240   PT Time Calculation (min) 55 min   Activity Tolerance Patient tolerated treatment well;No increased pain   Behavior During Therapy [facility] for tasks assessed/performed      Past Medical History:  Diagnosis Date  . 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 Arthritis    generalized  . Asthma   . Atrophic vaginitis   . Back pain    DDD/stenosis  . Carotid stenosis    Carotid US 10/16: Plaque RICA (4-03%), normal LICA  . Depression    takes Cymbalta daily  . Diverticulosis    benign  . DJD (degenerative joint disease)   . Dyslipidemia   . Dysrhythmia   . Family history of GI bleeding   . GERD (gastroesophageal reflux disease)    takes Nexium daily  . Gestational diabetes   . H/O hiatal hernia    surgery for hernia  . Headache(784.0)    takes Imitrex daily as needed and Bisoprolol daily;last migraine was about 2wks ago  . History of bronchitis 1 yr ago  . History of shingles   . Insomnia    takes Ambien nightly  . Joint pain   . Joint swelling   . Leg cramps    takes Flexeril daily as needed  . 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 Nocturia   . Osteoporosis   . Peripheral neuropathy (HCC)    takes Gabapentin daily  . Pneumonia 77yr ago   hx of  . Restless leg syndrome    takes Requip daily  . RLS (restless legs syndrome) 08/16/2015  . Sleep apnea    uses CPAP  . Tubular adenoma of colon   . Vertigo   . Vitamin D deficiency     Past Surgical  History:  Procedure Laterality Date  . COLONOSCOPY    . EP IMPLANTABLE DEVICE N/A 11/19/2016   Procedure: Loop Recorder Insertion;  Surgeon: SDeboraha Sprang MD;  [facility];  Service: Cardiovascular;  Laterality: N/A;  . ESOPHAGOGASTRODUODENOSCOPY    . excess skin removal     post weight loss  . GASTRIC BYPASS  2001  . HERNIA REPAIR     umbilical  . JOINT REPLACEMENT Right    knee  . skin reduction  2003 2004   stomach and arm  . STEROID INJECTION TO SCAR     x 2 in back   . TOTAL KNEE ARTHROPLASTY Left 04/03/2015   Procedure: LEFT TOTAL KNEE ARTHROPLASTY;  Surgeon: PMelrose Nakayama MD;  [facility] [facility]  Service: Orthopedics;  Laterality: Left;  . WISDOM TOOTH EXTRACTION      There were no vitals filed for this visit.      Subjective Assessment - 02/12/17 1152    Subjective Neck and shoulder pain. May be due to rain   Currently in Pain? Yes   Pain Score 5    Pain Location Neck   Pain Orientation  Right;Left;Posterior   Pain Descriptors / Indicators Sore   Pain Type Chronic pain   Pain Onset More than a month ago   Pain Frequency Constant   Aggravating Factors  moving head   Pain Relieving Factors volatarin cream, tyleniol , heat                         [facility] Adult PT Treatment/Exercise - 02/12/17 0001      Neuro Re-ed    Neuro Re-ed Details  Balance training with TAI CHI for arthritis Level 1 forms     Modalities   Modalities Traction     Traction   Type of Traction Cervical   Min (lbs) 5   Max (lbs) 15   Hold Time 60   Rest Time 15   Time 15     Manual Therapy   Manual Therapy Passive ROM   Soft tissue mobilization sub occipitals cervical paraspinals, upper, middle traps. and manual traction  x 10 for 20-25 se   Passive ROM neck rotation and sidebending stretching with 15-20 sec       side lye arm openings RT/LT x 12            PT Short Term Goals - 02/12/17 1333      PT SHORT TERM GOAL #1   Title She will be  independent with inital HEP   Status On-going     PT SHORT TERM GOAL #2   Title She will report pain decr 25 for general activity at home    Baseline varies   Status On-going     PT SHORT TERM GOAL #3   Title She will improve cervical rotation to 50 degrees bilaterally to see behind in car   Status Unable to assess     PT SHORT TERM GOAL #4   Title she will demo understanding of good posture   Baseline verbalizes understanding   Status On-going           PT Long Term Goals - 02/02/17 1326      PT LONG TERM GOAL #1   Title She will be independent with all HEP issued    Time 6   Period Weeks   Status New     PT LONG TERM GOAL #2   Title She will report pain as intermittant in RT neck   Time 6   Period Weeks   Status New     PT LONG TERM GOAL #3   Title She will improve BERG score to 54/56 to show improveent in balance.    Time 6   Period Weeks   Status New     PT LONG TERM GOAL #4   Title She will report able to see traffic with 50% decreased pain    Time 6   Period Weeks   Status New               Plan - 02/12/17 1336    Clinical Impression Statement Pain today . Not sure cause. Possible rainy weather. Decreased pain with manual treatment and tractions . She was able to do balance activity safley but I had her in parallel bars for protection .    PT Treatment/Interventions Iontophoresis 77m/ml Dexamethasone;Moist Heat;Traction;Ultrasound;Passive range of motion;Patient/family education;Taping;Dry needling;Therapeutic exercise;Therapeutic activities;Manual techniques;Balance training   PT Next Visit Plan reveiew HEP , continue mech traction if helpful. Balance activity/ LE strength   PT Home Exercise Plan Supine scap stabilization yellow band  Consulted and Agree with Plan of Care Patient      Patient will benefit from skilled therapeutic intervention in order to improve the following deficits and impairments:  Postural dysfunction, Decreased  strength, Decreased activity tolerance, Decreased balance, Pain, Increased muscle spasms, Decreased range of motion, Difficulty walking  Visit Diagnosis: Cervical dystonia  Cervicalgia  Stiffness of cervical spine  Abnormal posture  Cramp and spasm     Problem List Patient Active Problem List   Diagnosis Date Noted  . Torticollis, acquired 10/23/2016  . Asthma 08/04/2016  . Other secondary pulmonary hypertension 06/11/2016  . 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 Chronic migraine without aura without status migrainosus, not intractable 03/19/2016  . History of gastric bypass 12/05/2015  . RLS (restless legs syndrome) 08/16/2015  . Vitamin B12 deficiency anemia due to intrinsic factor deficiency 08/16/2015  . Iron deficiency anemia 08/16/2015  . Osteoporosis 08/16/2015  . Primary osteoarthritis of left knee 04/03/2015  . Obstructive sleep apnea 10/19/2014  . 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 Hyperlipidemia 10/19/2014  . Morbid obesity ([place]) 10/19/2014    Darrel Hoover  PT 02/12/2017, 1:43 PM  [facility] [address] [place], [place], [address] Phone: [phone]   Fax:  [phone]  Name: Cimone Fahey Sleep MRN: [id number] Date of Birth: 01-10-1946

## 2017-02-14 LAB — CUP PACEART REMOTE DEVICE CHECK
Date Time Interrogation Session: 20180204134540
MDC IDC PG IMPLANT DT: 20171206

## 2017-02-14 NOTE — Progress Notes (Signed)
Carelink summary report received. Battery status OK. Normal device function. No new symptom episodes, tachy episodes, brady, or pause episodes. No new AF episodes. Monthly summary reports and ROV/PRN 

## 2017-02-16 ENCOUNTER — Ambulatory Visit: Payer: Medicare Other

## 2017-02-16 DIAGNOSIS — R252 Cramp and spasm: Secondary | ICD-10-CM

## 2017-02-16 DIAGNOSIS — G243 Spasmodic torticollis: Secondary | ICD-10-CM

## 2017-02-16 DIAGNOSIS — M6281 Muscle weakness (generalized): Secondary | ICD-10-CM

## 2017-02-16 DIAGNOSIS — M542 Cervicalgia: Secondary | ICD-10-CM | POA: Diagnosis not present

## 2017-02-16 DIAGNOSIS — M436 Torticollis: Secondary | ICD-10-CM | POA: Diagnosis not present

## 2017-02-16 DIAGNOSIS — R293 Abnormal posture: Secondary | ICD-10-CM

## 2017-02-16 NOTE — Therapy (Signed)
[place], [place], [address] Phone: [phone]   Fax:  [phone]  Physical Therapy Treatment  Patient Details  Name: [name] MRN: [id number] Date of Birth: 07-11-1946 Referring Provider: Sarina Ill, MD  Encounter Date: 02/16/2017      PT End of Session - 02/16/17 1231    Visit Number 5   Number of Visits 12   Date for PT Re-Evaluation 03/13/17   Authorization Type Medicare   PT Start Time 1230   PT Stop Time 1340   PT Time Calculation (min) 70 min   Activity Tolerance Patient tolerated treatment well;No increased pain   Behavior During Therapy [facility] for tasks assessed/performed      Past Medical History:  Diagnosis Date  . 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 Arthritis    generalized  . Asthma   . Atrophic vaginitis   . Back pain    DDD/stenosis  . Carotid stenosis    Carotid US 10/16: Plaque RICA (6-23%), normal LICA  . Depression    takes Cymbalta daily  . Diverticulosis    benign  . DJD (degenerative joint disease)   . Dyslipidemia   . Dysrhythmia   . Family history of GI bleeding   . GERD (gastroesophageal reflux disease)    takes Nexium daily  . Gestational diabetes   . H/O hiatal hernia    surgery for hernia  . Headache(784.0)    takes Imitrex daily as needed and Bisoprolol daily;last migraine was about 2wks ago  . History of bronchitis 1 yr ago  . History of shingles   . Insomnia    takes Ambien nightly  . Joint pain   . Joint swelling   . Leg cramps    takes Flexeril daily as needed  . 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 Nocturia   . Osteoporosis   . Peripheral neuropathy (HCC)    takes Gabapentin daily  . Pneumonia 33yr ago   hx of  . Restless leg syndrome    takes Requip daily  . RLS (restless legs syndrome) 08/16/2015  . Sleep apnea    uses CPAP  . Tubular adenoma of colon   . Vertigo   . Vitamin D deficiency     Past Surgical  History:  Procedure Laterality Date  . COLONOSCOPY    . EP IMPLANTABLE DEVICE N/A 11/19/2016   Procedure: Loop Recorder Insertion;  Surgeon: SDeboraha Sprang MD;  [facility];  Service: Cardiovascular;  Laterality: N/A;  . ESOPHAGOGASTRODUODENOSCOPY    . excess skin removal     post weight loss  . GASTRIC BYPASS  2001  . HERNIA REPAIR     umbilical  . JOINT REPLACEMENT Right    knee  . skin reduction  2003 2004   stomach and arm  . STEROID INJECTION TO SCAR     x 2 in back   . TOTAL KNEE ARTHROPLASTY Left 04/03/2015   Procedure: LEFT TOTAL KNEE ARTHROPLASTY;  Surgeon: PMelrose Nakayama MD;  [facility] [facility]  Service: Orthopedics;  Laterality: Left;  . WISDOM TOOTH EXTRACTION      There were no vitals filed for this visit.                       [facility]Exercise - 02/16/17 0001      Neuro Re-ed    Neuro Re-ed Details  Balance  training with TAI CHI for arthritis Level 1 forms     Shoulder Exercises: Supine   Other Supine Exercises Green stabilization exercise overhead liftrs and , horizontal abduction and diagonals  x 15 each     Moist Heat Therapy   Number Minutes Moist Heat 15 Minutes   Moist Heat Location Cervical     Traction   Min (lbs) 5   Max (lbs) 15   Hold Time 60   Rest Time 15   Time 15     Manual Therapy   Soft tissue mobilization --   Passive ROM --                  PT Short Term Goals - 02/16/17 1315      PT SHORT TERM GOAL #1   Title She will be independent with inital HEP   Status Achieved     PT SHORT TERM GOAL #2   Title She will report pain decr 25 for general activity at home    Baseline varies   Status On-going     PT SHORT TERM GOAL #3   Title She will improve cervical rotation to 50 degrees bilaterally to see behind in car     PT SHORT TERM GOAL #4   Title she will demo understanding of good posture   Baseline verbalizes understanding and can demo as much as allowed  by  current stiffness and postural changes           PT Long Term Goals - 02/02/17 1326      PT LONG TERM GOAL #1   Title She will be independent with all HEP issued    Time 6   Period Weeks   Status New     PT LONG TERM GOAL #2   Title She will report pain as intermittant in RT neck   Time 6   Period Weeks   Status New     PT LONG TERM GOAL #3   Title She will improve BERG score to 54/56 to show improveent in balance.    Time 6   Period Weeks   Status New     PT LONG TERM GOAL #4   Title She will report able to see traffic with 50% decreased pain    Time 6   Period Weeks   Status New               Plan - 02/16/17 1231    Clinical Impression Statement Doing well with stepping and stabilization exercise. She does get confused with Tai Chi exercise but this is not unusual as she has not done this before.   She is up to green band with exercies without incr pain.   Traction seems to be helpful.      PT Treatment/Interventions Iontophoresis 24m/ml Dexamethasone;Moist Heat;Traction;Ultrasound;Passive range of motion;Patient/family education;Taping;Dry needling;Therapeutic exercise;Therapeutic activities;Manual techniques;Balance training   PT Next Visit Plan  , continue mech traction if helpful. Balance activity/ LE strength, Tai Chi for arthritis forms.    PT Home Exercise Plan Supine scap stabilization yellow band    Consulted and Agree with Plan of Care Patient      Patient will benefit from skilled therapeutic intervention in order to improve the following deficits and impairments:  Postural dysfunction, Decreased strength, Decreased activity tolerance, Decreased balance, Pain, Increased muscle spasms, Decreased range of motion, Difficulty walking  Visit Diagnosis: Cervical dystonia  Cervicalgia  Stiffness of cervical spine  Abnormal posture  Cramp and  spasm  Muscle weakness (generalized)     Problem List Patient Active Problem List   Diagnosis Date  Noted  . Torticollis, acquired 10/23/2016  . Asthma 08/04/2016  . Other secondary pulmonary hypertension 06/11/2016  . 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 Chronic migraine without aura without status migrainosus, not intractable 03/19/2016  . History of gastric bypass 12/05/2015  . RLS (restless legs syndrome) 08/16/2015  . Vitamin B12 deficiency anemia due to intrinsic factor deficiency 08/16/2015  . Iron deficiency anemia 08/16/2015  . Osteoporosis 08/16/2015  . Primary osteoarthritis of left knee 04/03/2015  . Obstructive sleep apnea 10/19/2014  . 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 Hyperlipidemia 10/19/2014  . Morbid obesity ([place]) 10/19/2014    Darrel Hoover  PT 02/16/2017, 1:28 PM  [facility] [address] [place], [place], [address] Phone: [phone]   Fax:  [phone]  Name: Kyrstal Monterrosa Baig MRN: [id number] Date of Birth: 1946/04/17

## 2017-02-17 ENCOUNTER — Ambulatory Visit (INDEPENDENT_AMBULATORY_CARE_PROVIDER_SITE_OTHER): Payer: Medicare Other | Admitting: *Deleted

## 2017-02-17 DIAGNOSIS — I639 Cerebral infarction, unspecified: Secondary | ICD-10-CM | POA: Diagnosis not present

## 2017-02-17 NOTE — Progress Notes (Signed)
Carelink Summary Report / Loop Recorder 

## 2017-02-19 ENCOUNTER — Ambulatory Visit: Payer: Medicare Other | Admitting: Physical Therapy

## 2017-02-19 DIAGNOSIS — M542 Cervicalgia: Secondary | ICD-10-CM | POA: Diagnosis not present

## 2017-02-19 DIAGNOSIS — R293 Abnormal posture: Secondary | ICD-10-CM

## 2017-02-19 DIAGNOSIS — M6281 Muscle weakness (generalized): Secondary | ICD-10-CM | POA: Diagnosis not present

## 2017-02-19 DIAGNOSIS — M436 Torticollis: Secondary | ICD-10-CM

## 2017-02-19 DIAGNOSIS — G243 Spasmodic torticollis: Secondary | ICD-10-CM

## 2017-02-19 DIAGNOSIS — R252 Cramp and spasm: Secondary | ICD-10-CM

## 2017-02-19 NOTE — Therapy (Signed)
[place], [place], [address] Phone: [phone]   Fax:  [phone]  Physical Therapy Treatment  Patient Details  Name: [name] MRN: [id number] Date of Birth: Jul 11, 1946 Referring Provider: Sarina Ill, MD  Encounter Date: 02/19/2017      PT End of Session - 02/19/17 1112    Visit Number 6   Number of Visits 12   Date for PT Re-Evaluation 03/13/17   Authorization Type Medicare   PT Start Time 1102   PT Stop Time 1210   PT Time Calculation (min) 68 min      Past Medical History:  Diagnosis Date  . 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 Arthritis    generalized  . Asthma   . Atrophic vaginitis   . Back pain    DDD/stenosis  . Carotid stenosis    Carotid US 10/16: Plaque RICA (2-63%), normal LICA  . Depression    takes Cymbalta daily  . Diverticulosis    benign  . DJD (degenerative joint disease)   . Dyslipidemia   . Dysrhythmia   . Family history of GI bleeding   . GERD (gastroesophageal reflux disease)    takes Nexium daily  . Gestational diabetes   . H/O hiatal hernia    surgery for hernia  . Headache(784.0)    takes Imitrex daily as needed and Bisoprolol daily;last migraine was about 2wks ago  . History of bronchitis 1 yr ago  . History of shingles   . Insomnia    takes Ambien nightly  . Joint pain   . Joint swelling   . Leg cramps    takes Flexeril daily as needed  . 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 Nocturia   . Osteoporosis   . Peripheral neuropathy (HCC)    takes Gabapentin daily  . Pneumonia 27yr ago   hx of  . Restless leg syndrome    takes Requip daily  . RLS (restless legs syndrome) 08/16/2015  . Sleep apnea    uses CPAP  . Tubular adenoma of colon   . Vertigo   . Vitamin D deficiency     Past Surgical History:  Procedure Laterality Date  . COLONOSCOPY    . EP IMPLANTABLE DEVICE N/A 11/19/2016   Procedure: Loop Recorder Insertion;   Surgeon: SDeboraha Sprang MD;  [facility];  Service: Cardiovascular;  Laterality: N/A;  . ESOPHAGOGASTRODUODENOSCOPY    . excess skin removal     post weight loss  . GASTRIC BYPASS  2001  . HERNIA REPAIR     umbilical  . JOINT REPLACEMENT Right    knee  . skin reduction  2003 2004   stomach and arm  . STEROID INJECTION TO SCAR     x 2 in back   . TOTAL KNEE ARTHROPLASTY Left 04/03/2015   Procedure: LEFT TOTAL KNEE ARTHROPLASTY;  Surgeon: PMelrose Nakayama MD;  [facility]  Service: Orthopedics;  Laterality: Left;  . WISDOM TOOTH EXTRACTION      There were no vitals filed for this visit.      Subjective Assessment - 02/19/17 1216    Subjective right side of neck hurting today, up into head and down into shoulder   Currently in Pain? Yes   Pain Score 6    Pain Location Neck   Pain Orientation Right   Pain Descriptors / Indicators Aching;Sore   Aggravating Factors  end range  neckmotions   Pain Relieving Factors heat, massage, traction            [facility] PT Assessment - 02/19/17 0001      AROM   Cervical Flexion 50   Cervical Extension 32   Cervical - Right Side Bend 25   Cervical - Left Side Bend 32   Cervical - Right Rotation 40   Cervical - Left Rotation 35                     [facility] Adult PT Treatment/Exercise - 02/19/17 0001      Moist Heat Therapy   Number Minutes Moist Heat 15 Minutes   Moist Heat Location Cervical     Traction   Min (lbs) 5   Max (lbs) 15   Hold Time 60   Rest Time 15   Time 15     Manual Therapy   Manual Therapy Passive ROM   Soft tissue mobilization sub occipitals cervical paraspinals, upper, middle traps. and manual traction  x 10 for 20-25 se   Passive ROM neck rotation and sidebending stretching with 15-20 sec      Neck Exercises: Stretches   Upper Trapezius Stretch 2 reps;20 seconds   Levator Stretch 2 reps;20 seconds                PT Education - 02/19/17 1143    Education  provided Yes   Education Details HEP   Person(s) Educated Patient   Methods Explanation;Handout   Comprehension Verbalized understanding          PT Short Term Goals - 02/19/17 1111      PT SHORT TERM GOAL #1   Title She will be independent with inital HEP   Time 2   Period Weeks   Status Achieved     PT SHORT TERM GOAL #2   Title She will report pain decr 25 for general activity at home    Time 3   Period Weeks   Status Achieved     PT SHORT TERM GOAL #3   Title She will improve cervical rotation to 50 degrees bilaterally to see behind in car   Baseline unchanged   Time 3   Period Weeks   Status On-going     PT SHORT TERM GOAL #4   Title she will demo understanding of good posture   Baseline verbalizes understanding and can demo as much as allowed  by current stiffness and postural changes   Time 3   Period Weeks   Status Achieved           PT Long Term Goals - 02/02/17 1326      PT LONG TERM GOAL #1   Title She will be independent with all HEP issued    Time 6   Period Weeks   Status New     PT LONG TERM GOAL #2   Title She will report pain as intermittant in RT neck   Time 6   Period Weeks   Status New     PT LONG TERM GOAL #3   Title She will improve BERG score to 54/56 to show improveent in balance.    Time 6   Period Weeks   Status New     PT LONG TERM GOAL #4   Title She will report able to see traffic with 50% decreased pain    Time 6   Period Weeks   Status New  Plan - 02/19/17 1144    Clinical Impression Statement cervical AROm improving. Added levator sretch to HEP. Focused manual to decrease right sided neck pain. Repeated traction per pt request. Increased pull per pt request. pt reports decreased neck pain after manual.    PT Next Visit Plan  , continue mech traction if helpful. Balance activity/ LE strength, Tai Chi for arthritis forms.    PT Home Exercise Plan Supine scap stabilization yellow band , upper  trap stretch, levator stretch, Tai CHi    Consulted and Agree with Plan of Care Patient      Patient will benefit from skilled therapeutic intervention in order to improve the following deficits and impairments:  Postural dysfunction, Decreased strength, Decreased activity tolerance, Decreased balance, Pain, Increased muscle spasms, Decreased range of motion, Difficulty walking  Visit Diagnosis: Cervical dystonia  Cervicalgia  Abnormal posture  Cramp and spasm  Muscle weakness (generalized)  Stiffness of cervical spine     Problem List Patient Active Problem List   Diagnosis Date Noted  . Torticollis, acquired 10/23/2016  . Asthma 08/04/2016  . Other secondary pulmonary hypertension 06/11/2016  . 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 Chronic migraine without aura without status migrainosus, not intractable 03/19/2016  . History of gastric bypass 12/05/2015  . RLS (restless legs syndrome) 08/16/2015  . Vitamin B12 deficiency anemia due to intrinsic factor deficiency 08/16/2015  . Iron deficiency anemia 08/16/2015  . Osteoporosis 08/16/2015  . Primary osteoarthritis of left knee 04/03/2015  . Obstructive sleep apnea 10/19/2014  . 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 Hyperlipidemia 10/19/2014  . Morbid obesity ([place]) 10/19/2014    Dorene Ar, PTA 02/19/2017, 12:38 PM  [facility] [address] [place], [place], [address] Phone: [phone]   Fax:  [phone]  Name: [name] MRN: [id number] Date of Birth: Mar 15, 1946

## 2017-02-23 ENCOUNTER — Ambulatory Visit: Payer: Medicare Other | Admitting: Physical Therapy

## 2017-02-26 ENCOUNTER — Encounter: Payer: Self-pay | Admitting: Neurology

## 2017-02-26 ENCOUNTER — Ambulatory Visit: Payer: Medicare Other | Admitting: Physical Therapy

## 2017-02-26 ENCOUNTER — Ambulatory Visit (INDEPENDENT_AMBULATORY_CARE_PROVIDER_SITE_OTHER): Payer: Medicare Other | Admitting: Neurology

## 2017-02-26 VITALS — BP 117/71 | HR 80 | Ht 66.0 in | Wt 226.0 lb

## 2017-02-26 DIAGNOSIS — M436 Torticollis: Secondary | ICD-10-CM

## 2017-02-26 DIAGNOSIS — G43711 Chronic migraine without aura, intractable, with status migrainosus: Secondary | ICD-10-CM | POA: Diagnosis not present

## 2017-02-26 DIAGNOSIS — R293 Abnormal posture: Secondary | ICD-10-CM

## 2017-02-26 DIAGNOSIS — R252 Cramp and spasm: Secondary | ICD-10-CM | POA: Diagnosis not present

## 2017-02-26 DIAGNOSIS — M6281 Muscle weakness (generalized): Secondary | ICD-10-CM | POA: Diagnosis not present

## 2017-02-26 DIAGNOSIS — M542 Cervicalgia: Secondary | ICD-10-CM | POA: Diagnosis not present

## 2017-02-26 DIAGNOSIS — G243 Spasmodic torticollis: Secondary | ICD-10-CM | POA: Diagnosis not present

## 2017-02-26 NOTE — Therapy (Signed)
[place], [place], [address] Phone: [phone]   Fax:  [phone]  Physical Therapy Treatment  Patient Details  Name: [name] MRN: [id number] Date of Birth: [birth year] Referring Provider: Sarina Ill, MD  Encounter Date: 02/26/2017      PT End of Session - 02/26/17 1153    Visit Number 7   Number of Visits 12   Date for PT Re-Evaluation 03/13/17   Authorization Type Medicare   PT Start Time 7619   PT Stop Time 1248   PT Time Calculation (min) 53 min      Past Medical History:  Diagnosis Date  . 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 Arthritis    generalized  . Asthma   . Atrophic vaginitis   . Back pain    DDD/stenosis  . Carotid stenosis    Carotid US 10/16: Plaque RICA (5-09%), normal LICA  . Depression    takes Cymbalta daily  . Diverticulosis    benign  . DJD (degenerative joint disease)   . Dyslipidemia   . Dysrhythmia   . Family history of GI bleeding   . GERD (gastroesophageal reflux disease)    takes Nexium daily  . Gestational diabetes   . H/O hiatal hernia    surgery for hernia  . Headache(784.0)    takes Imitrex daily as needed and Bisoprolol daily;last migraine was about 2wks ago  . History of bronchitis 1 yr ago  . History of shingles   . Insomnia    takes Ambien nightly  . Joint pain   . Joint swelling   . Leg cramps    takes Flexeril daily as needed  . 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 Nocturia   . Osteoporosis   . Peripheral neuropathy (HCC)    takes Gabapentin daily  . Pneumonia 23yr ago   hx of  . Restless leg syndrome    takes Requip daily  . RLS (restless legs syndrome) 08/16/2015  . Sleep apnea    uses CPAP  . Tubular adenoma of colon   . Vertigo   . Vitamin D deficiency     Past Surgical History:  Procedure Laterality Date  . COLONOSCOPY    . EP IMPLANTABLE DEVICE N/A 11/19/2016   Procedure: Loop Recorder Insertion;   Surgeon: SDeboraha Sprang MD;  [facility];  Service: Cardiovascular;  Laterality: N/A;  . ESOPHAGOGASTRODUODENOSCOPY    . excess skin removal     post weight loss  . GASTRIC BYPASS  2001  . HERNIA REPAIR     umbilical  . JOINT REPLACEMENT Right    knee  . skin reduction  2003 2004   stomach and arm  . STEROID INJECTION TO SCAR     x 2 in back   . TOTAL KNEE ARTHROPLASTY Left 04/03/2015   Procedure: LEFT TOTAL KNEE ARTHROPLASTY;  Surgeon: PMelrose Nakayama MD;  [facility] [facility]  Service: Orthopedics;  Laterality: Left;  . WISDOM TOOTH EXTRACTION      There were no vitals filed for this visit.      Subjective Assessment - 02/26/17 1253    Subjective I having a good day today. I had a headache yesterday.    Currently in Pain? No/denies            [facility] - [facility]      AROM   Cervical - Right Rotation 40  Cervical - Left Rotation 35     Berg Balance Test   Sit to Stand Able to stand without using hands and stabilize independently   Standing Unsupported Able to stand safely 2 minutes   Sitting with Back Unsupported but Feet Supported on Floor or Stool Able to sit safely and securely 2 minutes   Stand to Sit Sits safely with minimal use of hands   Transfers Able to transfer safely, minor use of hands   Standing Unsupported with Eyes Closed Able to stand 10 seconds safely   Standing Ubsupported with Feet Together Able to place feet together independently and stand 1 minute safely   From Standing, Reach Forward with Outstretched Arm Can reach forward >12 cm safely (5")   From Standing Position, Pick up Object from Floor Able to pick up shoe safely and easily   From Standing Position, Turn to Look Behind Over each Shoulder Looks behind one side only/other side shows less weight shift   Turn 360 Degrees Able to turn 360 degrees safely in 4 seconds or less   Standing Unsupported, Alternately Place Feet on Step/Stool Able to stand independently  and safely and complete 8 steps in 20 seconds   Standing Unsupported, One Foot in Front Able to place foot tandem independently and hold 30 seconds   Standing on One Leg Able to lift leg independently and hold > 10 seconds   Total Score 54                     [facility] Adult PT Treatment/Exercise - 02/26/17 0001      Posture/Postural Control   Posture Comments Gait with cues to lift chest bone to align head and shoulders.      Moist Heat Therapy   Number Minutes Moist Heat 15 Minutes   Moist Heat Location Cervical     Manual Therapy   Soft tissue mobilization sub occipitals cervical paraspinals, upper, middle traps. and manual traction  x 10 for 20-25 se   Passive ROM neck rotation and sidebending stretching with 15-20 sec   with cervical distraction     Neck Exercises: Stretches   Upper Trapezius Stretch 2 reps;20 seconds   Levator Stretch 2 reps;20 seconds                  PT Short Term Goals - 02/19/17 1111      PT SHORT TERM GOAL #1   Title She will be independent with inital HEP   Time 2   Period Weeks   Status Achieved     PT SHORT TERM GOAL #2   Title She will report pain decr 25 for general activity at home    Time 3   Period Weeks   Status Achieved     PT SHORT TERM GOAL #3   Title She will improve cervical rotation to 50 degrees bilaterally to see behind in car   Baseline unchanged   Time 3   Period Weeks   Status On-going     PT SHORT TERM GOAL #4   Title she will demo understanding of good posture   Baseline verbalizes understanding and can demo as much as allowed  by current stiffness and postural changes   Time 3   Period Weeks   Status Achieved           PT Long Term Goals - 02/26/17 1152      PT LONG TERM GOAL #1   Title She will be  independent with all HEP issued    Time 6   Period Weeks   Status On-going     PT LONG TERM GOAL #2   Title She will report pain as intermittant in RT neck   Time 6   Period Weeks    Status Achieved     PT LONG TERM GOAL #3   Title She will improve BERG score to 54/56 to show improveent in balance.    Baseline 54/56   Time 6   Period Weeks   Status Achieved     PT LONG TERM GOAL #4   Title She will report able to see traffic with 50% decreased pain    Baseline nearly 50%    Time 6   Period Weeks   Status Partially Met               Plan - 02/26/17 1248    Clinical Impression Statement Pt reports overall improvement in pain. She reports nearly 50% improvement in neck pain with cervical rotation during driving. STG# 2, #4 Met. BERG improvd to 54/56. LTG# 2, #3 Met. Her AROM cervical rotation remains unchanged. Pt is eager to try dry needling so she is scheduled for the next 4 visits to have TPDN in hope to gain more cervical rotation.    PT Next Visit Plan  , continue mech traction if helpful. Balance activity/ LE strength, Tai Chi for arthritis forms.    PT Home Exercise Plan Supine scap stabilization yellow band , upper trap stretch, levator stretch, Tai CHi    Consulted and Agree with Plan of Care Patient      Patient will benefit from skilled therapeutic intervention in order to improve the following deficits and impairments:  Postural dysfunction, Decreased strength, Decreased activity tolerance, Decreased balance, Pain, Increased muscle spasms, Decreased range of motion, Difficulty walking  Visit Diagnosis: Cervicalgia  Abnormal posture  Cramp and spasm  Stiffness of cervical spine  Muscle weakness (generalized)     Problem List Patient Active Problem List   Diagnosis Date Noted  . Torticollis, acquired 10/23/2016  . Asthma 08/04/2016  . Other secondary pulmonary hypertension 06/11/2016  . 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 Chronic migraine without aura without status migrainosus, not intractable 03/19/2016  . History of gastric bypass 12/05/2015  . RLS (restless legs syndrome) 08/16/2015  . Vitamin B12 deficiency anemia due to  intrinsic factor deficiency 08/16/2015  . Iron deficiency anemia 08/16/2015  . Osteoporosis 08/16/2015  . Primary osteoarthritis of left knee 04/03/2015  . Obstructive sleep apnea 10/19/2014  . 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 Hyperlipidemia 10/19/2014  . Morbid obesity ([place]) 10/19/2014    Dorene Ar , PTA 02/26/2017, 12:59 PM  [facility] [address] [place], [place], [address] Phone: [phone]   Fax:  [phone]  Name: Raylinn Kosar Davidovich MRN: [id number] Date of Birth: March 20, 1946

## 2017-02-26 NOTE — Progress Notes (Signed)

## 2017-03-02 ENCOUNTER — Ambulatory Visit: Payer: Medicare Other | Admitting: Physical Therapy

## 2017-03-03 ENCOUNTER — Encounter: Payer: Self-pay | Admitting: Physical Therapy

## 2017-03-03 ENCOUNTER — Ambulatory Visit: Payer: Medicare Other | Admitting: Physical Therapy

## 2017-03-03 DIAGNOSIS — M542 Cervicalgia: Secondary | ICD-10-CM | POA: Diagnosis not present

## 2017-03-03 DIAGNOSIS — R252 Cramp and spasm: Secondary | ICD-10-CM

## 2017-03-03 DIAGNOSIS — M6281 Muscle weakness (generalized): Secondary | ICD-10-CM | POA: Diagnosis not present

## 2017-03-03 DIAGNOSIS — G243 Spasmodic torticollis: Secondary | ICD-10-CM | POA: Diagnosis not present

## 2017-03-03 DIAGNOSIS — R293 Abnormal posture: Secondary | ICD-10-CM

## 2017-03-03 DIAGNOSIS — M436 Torticollis: Secondary | ICD-10-CM

## 2017-03-03 NOTE — Therapy (Signed)
[place], [place], [address] Phone: [phone]   Fax:  [phone]  Physical Therapy Treatment  Patient Details  Name: [name] MRN: [id number] Date of Birth: [birth year] Referring Provider: Sarina Ill, MD  Encounter Date: 03/03/2017      PT End of Session - 03/03/17 0937    Visit Number 8   Number of Visits 12   Date for PT Re-Evaluation 03/13/17   PT Start Time 0932   PT Stop Time 1030   PT Time Calculation (min) 58 min   Activity Tolerance Patient tolerated treatment well   Behavior During Therapy [facility] for tasks assessed/performed      Past Medical History:  Diagnosis Date  . 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 Arthritis    generalized  . Asthma   . Atrophic vaginitis   . Back pain    DDD/stenosis  . Carotid stenosis    Carotid US 10/16: Plaque RICA (1-58%), normal LICA  . Depression    takes Cymbalta daily  . Diverticulosis    benign  . DJD (degenerative joint disease)   . Dyslipidemia   . Dysrhythmia   . Family history of GI bleeding   . GERD (gastroesophageal reflux disease)    takes Nexium daily  . Gestational diabetes   . H/O hiatal hernia    surgery for hernia  . Headache(784.0)    takes Imitrex daily as needed and Bisoprolol daily;last migraine was about 2wks ago  . History of bronchitis 1 yr ago  . History of shingles   . Insomnia    takes Ambien nightly  . Joint pain   . Joint swelling   . Leg cramps    takes Flexeril daily as needed  . 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 Nocturia   . Osteoporosis   . Peripheral neuropathy (HCC)    takes Gabapentin daily  . Pneumonia 50yr ago   hx of  . Restless leg syndrome    takes Requip daily  . RLS (restless legs syndrome) 08/16/2015  . Sleep apnea    uses CPAP  . Tubular adenoma of colon   . Vertigo   . Vitamin D deficiency     Past Surgical History:  Procedure Laterality Date  .  COLONOSCOPY    . EP IMPLANTABLE DEVICE N/A 11/19/2016   Procedure: Loop Recorder Insertion;  Surgeon: SDeboraha Sprang MD;  [facility];  Service: Cardiovascular;  Laterality: N/A;  . ESOPHAGOGASTRODUODENOSCOPY    . excess skin removal     post weight loss  . GASTRIC BYPASS  2001  . HERNIA REPAIR     umbilical  . JOINT REPLACEMENT Right    knee  . skin reduction  2003 2004   stomach and arm  . STEROID INJECTION TO SCAR     x 2 in back   . TOTAL KNEE ARTHROPLASTY Left 04/03/2015   Procedure: LEFT TOTAL KNEE ARTHROPLASTY;  Surgeon: PMelrose Nakayama MD;  [facility] [facility]  Service: Orthopedics;  Laterality: Left;  . WISDOM TOOTH EXTRACTION      There were no vitals filed for this visit.      Subjective Assessment - 03/03/17 0933    Subjective "things are going about the same"    Currently in Pain? Yes   Pain Score 3    Pain Location Neck   Pain Orientation Right   Pain Descriptors / Indicators Aching;Sore  Pain Type Chronic pain   Pain Onset More than a month ago   Pain Frequency Constant   Aggravating Factors  looking to the R/ L   Pain Relieving Factors heat                         [facility] Adult PT Treatment/Exercise - 03/03/17 0001      Moist Heat Therapy   Number Minutes Moist Heat 15 Minutes   Moist Heat Location Cervical     Manual Therapy   Manual Therapy Joint mobilization;Myofascial release   Joint Mobilization T1-T9 Grade 3 PA   Soft tissue mobilization IASTM ov erthe R upper trap, sub-occipitals and cervical multifidus    Myofascial Release fascial stretching/ rolling over the L upper trap and sub-occipitals     Neck Exercises: Stretches   Upper Trapezius Stretch 2 reps;20 seconds   Levator Stretch 2 reps;20 seconds          Trigger Point Dry Needling - 03/03/17 0956    Consent Given? Yes   Education Handout Provided Yes   Muscles Treated Upper Body Upper trapezius;Suboccipitals muscle group;Longissimus   Upper  Trapezius Response Twitch reponse elicited;Palpable increased muscle length  r    SubOccipitals Response Twitch response elicited;Palpable increased muscle length  R only   Longissimus Response Twitch response elicited;Palpable increased muscle length  R C3, C5, C7              PT Education - 03/03/17 1045    Education provided Yes   Education Details anatomy of muscle and referral of pain. What TPDN is, beneftis, what to expect and aftercare.    Person(s) Educated Patient   Methods Explanation;Verbal cues   Comprehension Verbalized understanding;Verbal cues required          PT Short Term Goals - 02/19/17 1111      PT SHORT TERM GOAL #1   Title She will be independent with inital HEP   Time 2   Period Weeks   Status Achieved     PT SHORT TERM GOAL #2   Title She will report pain decr 25 for general activity at home    Time 3   Period Weeks   Status Achieved     PT SHORT TERM GOAL #3   Title She will improve cervical rotation to 50 degrees bilaterally to see behind in car   Baseline unchanged   Time 3   Period Weeks   Status On-going     PT SHORT TERM GOAL #4   Title she will demo understanding of good posture   Baseline verbalizes understanding and can demo as much as allowed  by current stiffness and postural changes   Time 3   Period Weeks   Status Achieved           PT Long Term Goals - 02/26/17 1152      PT LONG TERM GOAL #1   Title She will be independent with all HEP issued    Time 6   Period Weeks   Status On-going     PT LONG TERM GOAL #2   Title She will report pain as intermittant in RT neck   Time 6   Period Weeks   Status Achieved     PT LONG TERM GOAL #3   Title She will improve BERG score to 54/56 to show improveent in balance.    Baseline 54/56   Time 6   Period Weeks  Status Achieved     PT LONG TERM GOAL #4   Title She will report able to see traffic with 50% decreased pain    Baseline nearly 50%    Time 6   Period  Weeks   Status Partially Met               Plan - 03/03/17 1039    Clinical Impression Statement pt reports some improvement in neck and with pain. DN was educated and performed on the R upper trap, sub-occipitals, and Cervical multifidus at C3, C5, C7. Following DN perofrmed thoracic mobs and soft tissue work which she reported decreased tightness and improvement in mobiliity. continued MHP post session for pain.    PT Next Visit Plan assess response to DN , continue mech traction if helpful. Balance activity/ LE strength, Tai Chi for arthritis forms. try inhibition taping?   Consulted and Agree with Plan of Care Patient      Patient will benefit from skilled therapeutic intervention in order to improve the following deficits and impairments:  Postural dysfunction, Decreased strength, Decreased activity tolerance, Decreased balance, Pain, Increased muscle spasms, Decreased range of motion, Difficulty walking  Visit Diagnosis: Cervicalgia  Cramp and spasm  Abnormal posture  Stiffness of cervical spine  Muscle weakness (generalized)  Cervical dystonia     Problem List Patient Active Problem List   Diagnosis Date Noted  . Torticollis, acquired 10/23/2016  . Asthma 08/04/2016  . Other secondary pulmonary hypertension 06/11/2016  . 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 Chronic migraine without aura without status migrainosus, not intractable 03/19/2016  . History of gastric bypass 12/05/2015  . RLS (restless legs syndrome) 08/16/2015  . Vitamin B12 deficiency anemia due to intrinsic factor deficiency 08/16/2015  . Iron deficiency anemia 08/16/2015  . Osteoporosis 08/16/2015  . Primary osteoarthritis of left knee 04/03/2015  . Obstructive sleep apnea 10/19/2014  . 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 Hyperlipidemia 10/19/2014  . Morbid obesity ([place]) 10/19/2014   Starr Lake PT, DPT, LAT, ATC  03/03/17  10:46 AM      [facility] [address] [place], [place], [address] Phone: [phone]   Fax:  [phone]  Name: [name] MRN: [id number] Date of Birth: December 07, 1946

## 2017-03-05 ENCOUNTER — Ambulatory Visit: Payer: Medicare Other

## 2017-03-06 ENCOUNTER — Ambulatory Visit: Payer: Medicare Other | Admitting: Physical Therapy

## 2017-03-06 ENCOUNTER — Telehealth: Payer: Self-pay

## 2017-03-06 NOTE — Telephone Encounter (Signed)
Received faxed PT eval from [facility] w/ plan for weekly visits x 4 wks.

## 2017-03-09 ENCOUNTER — Encounter: Payer: Self-pay | Admitting: Physical Therapy

## 2017-03-09 ENCOUNTER — Ambulatory Visit: Payer: Medicare Other | Admitting: Physical Therapy

## 2017-03-09 DIAGNOSIS — R293 Abnormal posture: Secondary | ICD-10-CM

## 2017-03-09 DIAGNOSIS — M436 Torticollis: Secondary | ICD-10-CM

## 2017-03-09 DIAGNOSIS — M542 Cervicalgia: Secondary | ICD-10-CM | POA: Diagnosis not present

## 2017-03-09 DIAGNOSIS — M6281 Muscle weakness (generalized): Secondary | ICD-10-CM

## 2017-03-09 DIAGNOSIS — G243 Spasmodic torticollis: Secondary | ICD-10-CM | POA: Diagnosis not present

## 2017-03-09 DIAGNOSIS — R252 Cramp and spasm: Secondary | ICD-10-CM

## 2017-03-09 NOTE — Therapy (Signed)
[place], [place], [address] Phone: [phone]   Fax:  [phone]  Physical Therapy Treatment  Patient Details  Name: [name] MRN: [id number] Date of Birth: 1946-02-20 Referring Provider: Sarina Ill, MD  Encounter Date: 03/09/2017      PT End of Session - 03/09/17 1349    Visit Number 9   Number of Visits 12   Date for PT Re-Evaluation 03/13/17   PT Start Time 1330   PT Stop Time 1430   PT Time Calculation (min) 60 min   Activity Tolerance Patient tolerated treatment well   Behavior During Therapy [facility] for tasks assessed/performed      Past Medical History:  Diagnosis Date  . 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 Arthritis    generalized  . Asthma   . Atrophic vaginitis   . Back pain    DDD/stenosis  . Carotid stenosis    Carotid US 10/16: Plaque RICA (3-29%), normal LICA  . Depression    takes Cymbalta daily  . Diverticulosis    benign  . DJD (degenerative joint disease)   . Dyslipidemia   . Dysrhythmia   . Family history of GI bleeding   . GERD (gastroesophageal reflux disease)    takes Nexium daily  . Gestational diabetes   . H/O hiatal hernia    surgery for hernia  . Headache(784.0)    takes Imitrex daily as needed and Bisoprolol daily;last migraine was about 2wks ago  . History of bronchitis 1 yr ago  . History of shingles   . Insomnia    takes Ambien nightly  . Joint pain   . Joint swelling   . Leg cramps    takes Flexeril daily as needed  . 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 Nocturia   . Osteoporosis   . Peripheral neuropathy (HCC)    takes Gabapentin daily  . Pneumonia 23yr ago   hx of  . Restless leg syndrome    takes Requip daily  . RLS (restless legs syndrome) 08/16/2015  . Sleep apnea    uses CPAP  . Tubular adenoma of colon   . Vertigo   . Vitamin D deficiency     Past Surgical History:  Procedure Laterality Date  .  COLONOSCOPY    . EP IMPLANTABLE DEVICE N/A 11/19/2016   Procedure: Loop Recorder Insertion;  Surgeon: SDeboraha Sprang MD;  [facility];  Service: Cardiovascular;  Laterality: N/A;  . ESOPHAGOGASTRODUODENOSCOPY    . excess skin removal     post weight loss  . GASTRIC BYPASS  2001  . HERNIA REPAIR     umbilical  . JOINT REPLACEMENT Right    knee  . skin reduction  2003 2004   stomach and arm  . STEROID INJECTION TO SCAR     x 2 in back   . TOTAL KNEE ARTHROPLASTY Left 04/03/2015   Procedure: LEFT TOTAL KNEE ARTHROPLASTY;  Surgeon: PMelrose Nakayama MD;  [facility]  Service: Orthopedics;  Laterality: Left;  . WISDOM TOOTH EXTRACTION      There were no vitals filed for this visit.      Subjective Assessment - 03/09/17 1334    Subjective "The TPDN really helped, I was alittle sore the day after but it feels looser"   Currently in Pain? Yes   Pain Score 4    Pain Location Neck   Pain Orientation Right;Left  Pain Descriptors / Indicators Aching;Dull   Pain Type Chronic pain   Pain Onset More than a month ago   Pain Frequency Intermittent   Aggravating Factors  looking L    Pain Relieving Factors Heat                         [facility] Adult PT Treatment/Exercise - 03/09/17 0001      Neck Exercises: Seated   Other Seated Exercise seated thoracic extension over chair  verbal cues to keep chin tucked     Knee/Hip Exercises: Sidelying   Other Sidelying Knee/Hip Exercises book opening 2 x 10  with proper form/ rationale     Moist Heat Therapy   Number Minutes Moist Heat 15 Minutes   Moist Heat Location Cervical     Manual Therapy   Manual Therapy Taping   Joint Mobilization T1-T9 Grade 3 PA   Soft tissue mobilization IASTM ov erthe R upper trap, sub-occipitals and cervical multifidus    Mulligan B upper trap inhibition   trial     Neck Exercises: Stretches   Upper Trapezius Stretch 2 reps;30 seconds   Levator Stretch 2 reps;30 seconds           Trigger Point Dry Needling - 03/09/17 1344    Consent Given? Yes   Education Handout Provided Yes  given previously   Muscles Treated Upper Body Levator scapulae   Upper Trapezius Response Twitch reponse elicited;Palpable increased muscle length  bil   SubOccipitals Response Twitch response elicited;Palpable increased muscle length   Levator Scapulae Response Twitch response elicited;Palpable increased muscle length  bil   Longissimus Response Twitch response elicited;Palpable increased muscle length  C3 and C7 bil              PT Education - 03/09/17 1348    Education provided Yes   Education Details updated HEP for thoracic mobility using proper form/ rationale. Benefits of inhibition taping, and length of wear   Person(s) Educated Patient   Methods Explanation;Verbal cues;Handout;Demonstration   Comprehension Verbalized understanding;Verbal cues required;Returned demonstration          PT Short Term Goals - 02/19/17 1111      PT SHORT TERM GOAL #1   Title She will be independent with inital HEP   Time 2   Period Weeks   Status Achieved     PT SHORT TERM GOAL #2   Title She will report pain decr 25 for general activity at home    Time 3   Period Weeks   Status Achieved     PT SHORT TERM GOAL #3   Title She will improve cervical rotation to 50 degrees bilaterally to see behind in car   Baseline unchanged   Time 3   Period Weeks   Status On-going     PT SHORT TERM GOAL #4   Title she will demo understanding of good posture   Baseline verbalizes understanding and can demo as much as allowed  by current stiffness and postural changes   Time 3   Period Weeks   Status Achieved           PT Long Term Goals - 02/26/17 1152      PT LONG TERM GOAL #1   Title She will be independent with all HEP issued    Time 6   Period Weeks   Status On-going     PT LONG TERM GOAL #2   Title She  will report pain as intermittant in RT neck   Time 6    Period Weeks   Status Achieved     PT LONG TERM GOAL #3   Title She will improve BERG score to 54/56 to show improveent in balance.    Baseline 54/56   Time 6   Period Weeks   Status Achieved     PT LONG TERM GOAL #4   Title She will report able to see traffic with 50% decreased pain    Baseline nearly 50%    Time 6   Period Weeks   Status Partially Met               Plan - 03/09/17 1430    Clinical Impression Statement pt reports decreased pain since the last session. continued DN over the bil upper traps, levator scapule, C3 and C7 multifidus, sub-occipitals. continued soft tissue work following TPDN and upper thoracic mobs. she was able to do thoracic mobility exercises with report of decreased pain and tightness. continued MHP post session .    PT Next Visit Plan assess response to DN , continue mech traction if helpful. Balance activity/ LE strength, Tai Chi for arthritis forms. try inhibition taping?   PT Home Exercise Plan Supine scap stabilization yellow band , upper trap stretch, levator stretch, Tai CHi , thoracic mobility   Consulted and Agree with Plan of Care Patient      Patient will benefit from skilled therapeutic intervention in order to improve the following deficits and impairments:     Visit Diagnosis: Cervicalgia  Cramp and spasm  Abnormal posture  Stiffness of cervical spine  Muscle weakness (generalized)  Cervical dystonia     Problem List Patient Active Problem List   Diagnosis Date Noted  . Torticollis, acquired 10/23/2016  . Asthma 08/04/2016  . Other secondary pulmonary hypertension 06/11/2016  . 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 Chronic migraine without aura without status migrainosus, not intractable 03/19/2016  . History of gastric bypass 12/05/2015  . RLS (restless legs syndrome) 08/16/2015  . Vitamin B12 deficiency anemia due to intrinsic factor deficiency 08/16/2015  . Iron deficiency anemia 08/16/2015  . Osteoporosis  08/16/2015  . Primary osteoarthritis of left knee 04/03/2015  . Obstructive sleep apnea 10/19/2014  . 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 Hyperlipidemia 10/19/2014  . Morbid obesity ([place]) 10/19/2014   Starr Lake PT, DPT, LAT, ATC  03/09/17  2:35 PM      [facility] [address] [place], [place], [address] Phone: [phone]   Fax:  [phone]  Name: [name] MRN: [id number] Date of Birth: 1946-06-25

## 2017-03-11 ENCOUNTER — Ambulatory Visit: Payer: Medicare Other | Admitting: Physical Therapy

## 2017-03-12 ENCOUNTER — Other Ambulatory Visit: Payer: Self-pay | Admitting: Neurology

## 2017-03-16 ENCOUNTER — Encounter: Payer: Self-pay | Admitting: Physical Therapy

## 2017-03-16 ENCOUNTER — Ambulatory Visit: Payer: Medicare Other | Attending: Neurology | Admitting: Physical Therapy

## 2017-03-16 DIAGNOSIS — M6281 Muscle weakness (generalized): Secondary | ICD-10-CM | POA: Diagnosis not present

## 2017-03-16 DIAGNOSIS — M542 Cervicalgia: Secondary | ICD-10-CM | POA: Diagnosis not present

## 2017-03-16 DIAGNOSIS — R252 Cramp and spasm: Secondary | ICD-10-CM | POA: Diagnosis not present

## 2017-03-16 DIAGNOSIS — M436 Torticollis: Secondary | ICD-10-CM | POA: Insufficient documentation

## 2017-03-16 DIAGNOSIS — G243 Spasmodic torticollis: Secondary | ICD-10-CM | POA: Insufficient documentation

## 2017-03-16 DIAGNOSIS — R293 Abnormal posture: Secondary | ICD-10-CM | POA: Diagnosis not present

## 2017-03-16 NOTE — Therapy (Signed)
[place], [place], [address] Phone: [phone]   Fax:  [phone]  Physical Therapy Treatment / Re-certification  Patient Details  Name: [name] MRN: [id number] Date of Birth: 1946/05/12 Referring Provider: Sarina Ill, MD  Encounter Date: 03/16/2017      PT End of Session - 03/16/17 1427    Visit Number 10   Number of Visits 13   Date for PT Re-Evaluation 04/13/17   PT Start Time 3893   PT Stop Time 1515   PT Time Calculation (min) 58 min   Activity Tolerance Patient tolerated treatment well   Behavior During Therapy [facility] for tasks assessed/performed      Past Medical History:  Diagnosis Date  . 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 Arthritis    generalized  . Asthma   . Atrophic vaginitis   . Back pain    DDD/stenosis  . Carotid stenosis    Carotid US 10/16: Plaque RICA (7-34%), normal LICA  . Depression    takes Cymbalta daily  . Diverticulosis    benign  . DJD (degenerative joint disease)   . Dyslipidemia   . Dysrhythmia   . Family history of GI bleeding   . GERD (gastroesophageal reflux disease)    takes Nexium daily  . Gestational diabetes   . H/O hiatal hernia    surgery for hernia  . Headache(784.0)    takes Imitrex daily as needed and Bisoprolol daily;last migraine was about 2wks ago  . History of bronchitis 1 yr ago  . History of shingles   . Insomnia    takes Ambien nightly  . Joint pain   . Joint swelling   . Leg cramps    takes Flexeril daily as needed  . 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 Nocturia   . Osteoporosis   . Peripheral neuropathy (HCC)    takes Gabapentin daily  . Pneumonia 53yr ago   hx of  . Restless leg syndrome    takes Requip daily  . RLS (restless legs syndrome) 08/16/2015  . Sleep apnea    uses CPAP  . Tubular adenoma of colon   . Vertigo   . Vitamin D deficiency     Past Surgical History:  Procedure Laterality  Date  . COLONOSCOPY    . EP IMPLANTABLE DEVICE N/A 11/19/2016   Procedure: Loop Recorder Insertion;  Surgeon: SDeboraha Sprang MD;  [facility];  Service: Cardiovascular;  Laterality: N/A;  . ESOPHAGOGASTRODUODENOSCOPY    . excess skin removal     post weight loss  . GASTRIC BYPASS  2001  . HERNIA REPAIR     umbilical  . JOINT REPLACEMENT Right    knee  . skin reduction  2003 2004   stomach and arm  . STEROID INJECTION TO SCAR     x 2 in back   . TOTAL KNEE ARTHROPLASTY Left 04/03/2015   Procedure: LEFT TOTAL KNEE ARTHROPLASTY;  Surgeon: PMelrose Nakayama MD;  [facility]  Service: Orthopedics;  Laterality: Left;  . WISDOM TOOTH EXTRACTION      There were no vitals filed for this visit.      Subjective Assessment - 03/16/17 1419    Subjective "Things going good, The TPDN is really helping"    Currently in Pain? Yes   Pain Score 0-No pain   Pain Frequency Occasional   Aggravating Factors  prolonged laying down  Pain Relieving Factors heat            [facility] PT Assessment - 03/16/17 0001      AROM   Cervical Flexion 58   Cervical Extension 40   Cervical - Right Side Bend 34   Cervical - Left Side Bend 34   Cervical - Right Rotation 52   Cervical - Left Rotation 42                     [facility] Adult PT Treatment/Exercise - 03/16/17 0001      Moist Heat Therapy   Number Minutes Moist Heat 10 Minutes   Moist Heat Location Cervical     Manual Therapy   Joint Mobilization T1-T9 Grade 3 PA   Soft tissue mobilization IASTM ov erthe R upper trap, sub-occipitals and cervical multifidus    Mulligan B upper trap inhibition      Neck Exercises: Stretches   Upper Trapezius Stretch 2 reps;30 seconds   Levator Stretch 2 reps;30 seconds          Trigger Point Dry Needling - 03/16/17 1440    Consent Given? Yes   Education Handout Provided Yes   Upper Trapezius Response Twitch reponse elicited;Palpable increased muscle length    SubOccipitals Response Palpable increased muscle length;Twitch response elicited   Levator Scapulae Response Twitch response elicited;Palpable increased muscle length   Longissimus Response Twitch response elicited;Palpable increased muscle length  C4 and C7 bil, T1, T3 and T5 bil              PT Education - 03/16/17 1515    Education provided Yes   Education Details reviewed updated POC for 1 x a week for the next 3 weeks. reviewed previous HEP.   Person(s) Educated Patient   Methods Explanation;Verbal cues   Comprehension Verbalized understanding;Verbal cues required          PT Short Term Goals - 03/16/17 1426      PT SHORT TERM GOAL #1   Title She will be independent with inital HEP   Time 2   Period Weeks   Status Achieved     PT SHORT TERM GOAL #2   Title She will report pain decr 25 for general activity at home    Time 3   Period Weeks   Status Achieved     PT SHORT TERM GOAL #3   Title She will improve cervical rotation to 50 degrees bilaterally to see behind in car   Time 3   Period Weeks   Status Partially Met     PT SHORT TERM GOAL #4   Title she will demo understanding of good posture   Time 3   Period Weeks   Status Achieved           PT Long Term Goals - 03/16/17 1426      PT LONG TERM GOAL #1   Title She will be independent with all HEP issued    Time 6   Period Weeks   Status On-going     PT LONG TERM GOAL #2   Title She will report pain as intermittant in RT neck   Period Weeks   Status Achieved     PT LONG TERM GOAL #3   Title She will improve BERG score to 54/56 to show improveent in balance.    Time 6   Period Weeks   Status Achieved     PT LONG TERM GOAL #4  Title She will report able to see traffic with 50% decreased pain    Baseline self reported 305 improvement   Time 6   Period Weeks   Status Partially Met               Plan - 2017/03/28 1511    Clinical Impression Statement Mrs. Linenberger continues to make  progrewss improving cervical ROM and report of no pain. Continued TPDN over the Cervicothoracic paraspinals bil. continued soft tissue work and throacic mobs. post sesison she reported soreness from DN which MHP was given.  She is progressing well with goals. plan to drop to 1 x a week for the next 3 weeks to work on remaining goals and independent exercise    Rehab Potential Good   PT Frequency 1x / week   PT Duration 3 weeks   PT Treatment/Interventions Iontophoresis 30m/ml Dexamethasone;Moist Heat;Traction;Ultrasound;Passive range of motion;Patient/family education;Taping;Dry needling;Therapeutic exercise;Therapeutic activities;Manual techniques;Balance training   PT Next Visit Plan assess response to DN/ taping, Balance activity/ LE strength, Tai Chi for arthritis forms. try [place] and ther  for posture correction exercise   PT Home Exercise Plan Supine scap stabilization yellow band , upper trap stretch, levator stretch, Tai CHi , thoracic mobility   Consulted and Agree with Plan of Care Patient      Patient will benefit from skilled therapeutic intervention in order to improve the following deficits and impairments:  Postural dysfunction, Decreased strength, Decreased activity tolerance, Decreased balance, Pain, Increased muscle spasms, Decreased range of motion, Difficulty walking  Visit Diagnosis: Cervicalgia - Plan: PT plan of care cert/re-cert  Cramp and spasm - Plan: PT plan of care cert/re-cert  Abnormal posture - Plan: PT plan of care cert/re-cert  Stiffness of cervical spine - Plan: PT plan of care cert/re-cert  Muscle weakness (generalized) - Plan: PT plan of care cert/re-cert  Cervical dystonia - Plan: PT plan of care cert/re-cert       G-Codes - 0April 14, 20181513    Functional Limitation Other PT primary   Changing and Maintaining Body Position Current Status ((I5027 At least 40 percent but less than 60 percent impaired, limited or restricted   Changing and Maintaining Body  Position Goal Status ((X4128 At least 20 percent but less than 40 percent impaired, limited or restricted   Functional Assessment Tool Used Clinical judgement      Problem List Patient Active Problem List   Diagnosis Date Noted  . Torticollis, acquired 10/23/2016  . Asthma 08/04/2016  . Other secondary pulmonary hypertension 06/11/2016  . 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 Chronic migraine without aura without status migrainosus, not intractable 03/19/2016  . History of gastric bypass 12/05/2015  . RLS (restless legs syndrome) 08/16/2015  . Vitamin B12 deficiency anemia due to intrinsic factor deficiency 08/16/2015  . Iron deficiency anemia 08/16/2015  . Osteoporosis 08/16/2015  . Primary osteoarthritis of left knee 04/03/2015  . Obstructive sleep apnea 10/19/2014  . 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 Hyperlipidemia 10/19/2014  . [place] 10/19/2014   KStarr LakePT, DPT, LAT, ATC  014-Apr-2018 3:48 PM      [facility] [address]Phone: [phone]  Fax:  [phone] Name: CMelanye HiraldoCore MRN: [id number] of Birth: 809/14/1947

## 2017-03-17 ENCOUNTER — Ambulatory Visit: Payer: Medicare Other | Admitting: Physical Therapy

## 2017-03-19 ENCOUNTER — Ambulatory Visit (INDEPENDENT_AMBULATORY_CARE_PROVIDER_SITE_OTHER): Payer: Medicare Other | Admitting: *Deleted

## 2017-03-19 DIAGNOSIS — I639 Cerebral infarction, unspecified: Secondary | ICD-10-CM

## 2017-03-19 NOTE — Progress Notes (Signed)
Carelink Summary Report / Loop Recorder 

## 2017-03-20 LAB — CUP PACEART REMOTE DEVICE CHECK
Implantable Pulse Generator Implant Date: 20171206
MDC IDC SESS DTM: 20180306143630

## 2017-03-23 ENCOUNTER — Ambulatory Visit: Payer: Medicare Other | Admitting: Physical Therapy

## 2017-03-23 ENCOUNTER — Encounter: Payer: Self-pay | Admitting: Physical Therapy

## 2017-03-23 DIAGNOSIS — M542 Cervicalgia: Secondary | ICD-10-CM | POA: Diagnosis not present

## 2017-03-23 DIAGNOSIS — M436 Torticollis: Secondary | ICD-10-CM

## 2017-03-23 DIAGNOSIS — R293 Abnormal posture: Secondary | ICD-10-CM

## 2017-03-23 DIAGNOSIS — G243 Spasmodic torticollis: Secondary | ICD-10-CM | POA: Diagnosis not present

## 2017-03-23 DIAGNOSIS — R252 Cramp and spasm: Secondary | ICD-10-CM

## 2017-03-23 DIAGNOSIS — M6281 Muscle weakness (generalized): Secondary | ICD-10-CM

## 2017-03-23 NOTE — Therapy (Signed)
[place], [place], [address] Phone: [phone]   Fax:  [phone]  Physical Therapy Treatment  Patient Details  Name: [name] MRN: [id number] Date of Birth: 1946-06-22 Referring Provider: Sarina Ill, MD  Encounter Date: 03/23/2017      PT End of Session - 03/23/17 1344    Visit Number 11   Number of Visits 13   Date for PT Re-Evaluation 04/13/17   Authorization Type Medicare   PT Start Time 1331   PT Stop Time 1420   PT Time Calculation (min) 49 min   Activity Tolerance Patient tolerated treatment well   Behavior During Therapy [facility] for tasks assessed/performed      Past Medical History:  Diagnosis Date  . 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 Arthritis    generalized  . Asthma   . Atrophic vaginitis   . Back pain    DDD/stenosis  . Carotid stenosis    Carotid US 10/16: Plaque RICA (8-36%), normal LICA  . Depression    takes Cymbalta daily  . Diverticulosis    benign  . DJD (degenerative joint disease)   . Dyslipidemia   . Dysrhythmia   . Family history of GI bleeding   . GERD (gastroesophageal reflux disease)    takes Nexium daily  . Gestational diabetes   . H/O hiatal hernia    surgery for hernia  . Headache(784.0)    takes Imitrex daily as needed and Bisoprolol daily;last migraine was about 2wks ago  . History of bronchitis 1 yr ago  . History of shingles   . Insomnia    takes Ambien nightly  . Joint pain   . Joint swelling   . Leg cramps    takes Flexeril daily as needed  . 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 Nocturia   . Osteoporosis   . Peripheral neuropathy (HCC)    takes Gabapentin daily  . Pneumonia 38yr ago   hx of  . Restless leg syndrome    takes Requip daily  . RLS (restless legs syndrome) 08/16/2015  . Sleep apnea    uses CPAP  . Tubular adenoma of colon   . Vertigo   . Vitamin D deficiency     Past Surgical History:   Procedure Laterality Date  . COLONOSCOPY    . EP IMPLANTABLE DEVICE N/A 11/19/2016   Procedure: Loop Recorder Insertion;  Surgeon: SDeboraha Sprang MD;  [facility];  Service: Cardiovascular;  Laterality: N/A;  . ESOPHAGOGASTRODUODENOSCOPY    . excess skin removal     post weight loss  . GASTRIC BYPASS  2001  . HERNIA REPAIR     umbilical  . JOINT REPLACEMENT Right    knee  . skin reduction  2003 2004   stomach and arm  . STEROID INJECTION TO SCAR     x 2 in back   . TOTAL KNEE ARTHROPLASTY Left 04/03/2015   Procedure: LEFT TOTAL KNEE ARTHROPLASTY;  Surgeon: PMelrose Nakayama MD;  [place] [facility]  Service: Orthopedics;  Laterality: Left;  . WISDOM TOOTH EXTRACTION      There were no vitals filed for this visit.      Subjective Assessment - 03/23/17 1333    Subjective "I am doing alright today with some pain in the head and neck but its not too bad"   Currently in Pain? Yes   Pain Score 4  Pain Location Neck   Pain Orientation Right;Left   Pain Descriptors / Indicators Aching;Dull   Pain Type Chronic pain   Pain Onset More than a month ago   Pain Frequency Occasional   Aggravating Factors  looking to the L/ R, laying down for too long,    Pain Relieving Factors heat, exericse                         [facility] Adult PT Treatment/Exercise - 03/23/17 0001      Shoulder Exercises: Seated   Other Seated Exercises lower trap strengtheing 2 x 10 with tan band  verbal cues for form   Other Seated Exercises scapular depression 2 x 10  verbal cues to only comeback to neutral avoiding hiking     Modalities   Modalities Ultrasound     Ultrasound   Ultrasound Location bil upper trap    Ultrasound Parameters 1.3 w/cm2 x 5 min    x 2 for total of 10 min   Ultrasound Goals Pain;Other (Comment)  muscle tightness     Manual Therapy   Manual therapy comments DTM over bil upper trap     Neck Exercises: Stretches   Upper Trapezius Stretch 2 reps;30  seconds  performed during [place]   Levator Stretch 2 reps;30 seconds  performed during [place]                PT Education - 03/23/17 1342    Education provided Yes   Education Details manual trigger point release techniques to reduce tightness, how to perform at home. how to perform sub-occipital release using tennisballs at home. Discussed at length concurrent to therex that if the tightness returns that DN is only momentary relief and isn't the fix.    Person(s) Educated Patient   Methods Explanation;Verbal cues;Demonstration   Comprehension Verbalized understanding;Verbal cues required;Returned demonstration          PT Short Term Goals - 03/16/17 1426      PT SHORT TERM GOAL #1   Title She will be independent with inital HEP   Time 2   Period Weeks   Status Achieved     PT SHORT TERM GOAL #2   Title She will report pain decr 25 for general activity at home    Time 3   Period Weeks   Status Achieved     PT SHORT TERM GOAL #3   Title She will improve cervical rotation to 50 degrees bilaterally to see behind in car   Time 3   Period Weeks   Status Partially Met     PT SHORT TERM GOAL #4   Title she will demo understanding of good posture   Time 3   Period Weeks   Status Achieved           PT Long Term Goals - 03/16/17 1426      PT LONG TERM GOAL #1   Title She will be independent with all HEP issued    Time 6   Period Weeks   Status On-going     PT LONG TERM GOAL #2   Title She will report pain as intermittant in RT neck   Period Weeks   Status Achieved     PT LONG TERM GOAL #3   Title She will improve BERG score to 54/56 to show improveent in balance.    Time 6   Period Weeks   Status Achieved  PT LONG TERM GOAL #4   Title She will report able to see traffic with 50% decreased pain    Baseline self reported 305 improvement   Time 6   Period Weeks   Status Partially Met               Plan - 03/23/17 1431    Clinical Impression  Statement pt reports intermittent tightness in bil upper trap. opted to trial manual trigger point release techniques following [place]  and stretch to promote relief. focused on lower trap strengthening to help keep the shoulder down.       Patient will benefit from skilled therapeutic intervention in order to improve the following deficits and impairments:  Postural dysfunction, Decreased strength, Decreased activity tolerance, Decreased balance, Pain, Increased muscle spasms, Decreased range of motion, Difficulty walking  Visit Diagnosis: Cervicalgia  Cramp and spasm  Abnormal posture  Stiffness of cervical spine  Muscle weakness (generalized)  Cervical dystonia     Problem List Patient Active Problem List   Diagnosis Date Noted  . Torticollis, acquired 10/23/2016  . Asthma 08/04/2016  . Other secondary pulmonary hypertension 06/11/2016  . 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 Chronic migraine without aura without status migrainosus, not intractable 03/19/2016  . History of gastric bypass 12/05/2015  . RLS (restless legs syndrome) 08/16/2015  . Vitamin B12 deficiency anemia due to intrinsic factor deficiency 08/16/2015  . Iron deficiency anemia 08/16/2015  . Osteoporosis 08/16/2015  . Primary osteoarthritis of left knee 04/03/2015  . Obstructive sleep apnea 10/19/2014  . 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 Hyperlipidemia 10/19/2014  . Morbid obesity ([place]) 10/19/2014   Starr Lake PT, DPT, LAT, ATC  03/23/17  2:39 PM      [facility] [address] [place], [place], [address] Phone: [phone]   Fax:  [phone]  Name: [name] MRN: [id number] Date of Birth: Feb 02, 1946

## 2017-03-26 ENCOUNTER — Ambulatory Visit (HOSPITAL_COMMUNITY): Payer: Medicare Other | Attending: Internal Medicine

## 2017-03-26 ENCOUNTER — Other Ambulatory Visit: Payer: Self-pay

## 2017-03-26 DIAGNOSIS — I361 Nonrheumatic tricuspid (valve) insufficiency: Secondary | ICD-10-CM | POA: Insufficient documentation

## 2017-03-26 DIAGNOSIS — I2729 Other secondary pulmonary hypertension: Secondary | ICD-10-CM | POA: Insufficient documentation

## 2017-03-26 DIAGNOSIS — I517 Cardiomegaly: Secondary | ICD-10-CM | POA: Diagnosis not present

## 2017-03-26 DIAGNOSIS — G4733 Obstructive sleep apnea (adult) (pediatric): Secondary | ICD-10-CM | POA: Insufficient documentation

## 2017-03-28 LAB — CUP PACEART REMOTE DEVICE CHECK
Implantable Pulse Generator Implant Date: 20171206
MDC IDC SESS DTM: 20180405143550

## 2017-03-28 NOTE — Progress Notes (Signed)
Carelink summary report received. Battery status OK. Normal device function. No new symptom episodes, tachy episodes, brady, or pause episodes. No new AF episodes. Monthly summary reports and ROV/PRN 

## 2017-03-30 ENCOUNTER — Ambulatory Visit: Payer: Medicare Other

## 2017-03-30 DIAGNOSIS — G243 Spasmodic torticollis: Secondary | ICD-10-CM

## 2017-03-30 DIAGNOSIS — M436 Torticollis: Secondary | ICD-10-CM

## 2017-03-30 DIAGNOSIS — M542 Cervicalgia: Secondary | ICD-10-CM | POA: Diagnosis not present

## 2017-03-30 DIAGNOSIS — R293 Abnormal posture: Secondary | ICD-10-CM

## 2017-03-30 DIAGNOSIS — R252 Cramp and spasm: Secondary | ICD-10-CM

## 2017-03-30 DIAGNOSIS — M6281 Muscle weakness (generalized): Secondary | ICD-10-CM | POA: Diagnosis not present

## 2017-03-30 NOTE — Therapy (Signed)
[place], [place], [address] Phone: [phone]   Fax:  [phone]  Physical Therapy Treatment  Patient Details  Name: [name] MRN: [id number] Date of Birth: January 22, 1946 Referring Provider: Sarina Ill, MD  Encounter Date: 03/30/2017      PT End of Session - 03/30/17 1328    Visit Number 12   Number of Visits 16   Date for PT Re-Evaluation 04/17/17   Authorization Type Medicare   PT Start Time 1330   PT Stop Time 1420   PT Time Calculation (min) 50 min   Activity Tolerance Patient tolerated treatment well   Behavior During Therapy [facility] for tasks assessed/performed      Past Medical History:  Diagnosis Date  . 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 Arthritis    generalized  . Asthma   . Atrophic vaginitis   . Back pain    DDD/stenosis  . Carotid stenosis    Carotid US 10/16: Plaque RICA (1-76%), normal LICA  . Depression    takes Cymbalta daily  . Diverticulosis    benign  . DJD (degenerative joint disease)   . Dyslipidemia   . Dysrhythmia   . Family history of GI bleeding   . GERD (gastroesophageal reflux disease)    takes Nexium daily  . Gestational diabetes   . H/O hiatal hernia    surgery for hernia  . Headache(784.0)    takes Imitrex daily as needed and Bisoprolol daily;last migraine was about 2wks ago  . History of bronchitis 1 yr ago  . History of shingles   . Insomnia    takes Ambien nightly  . Joint pain   . Joint swelling   . Leg cramps    takes Flexeril daily as needed  . 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 Nocturia   . Osteoporosis   . Peripheral neuropathy    takes Gabapentin daily  . Pneumonia 5yr ago   hx of  . Restless leg syndrome    takes Requip daily  . RLS (restless legs syndrome) 08/16/2015  . Sleep apnea    uses CPAP  . Tubular adenoma of colon   . Vertigo   . Vitamin D deficiency     Past Surgical History:  Procedure  Laterality Date  . COLONOSCOPY    . EP IMPLANTABLE DEVICE N/A 11/19/2016   Procedure: Loop Recorder Insertion;  Surgeon: SDeboraha Sprang MD;  [facility];  Service: Cardiovascular;  Laterality: N/A;  . ESOPHAGOGASTRODUODENOSCOPY    . excess skin removal     post weight loss  . GASTRIC BYPASS  2001  . HERNIA REPAIR     umbilical  . JOINT REPLACEMENT Right    knee  . skin reduction  2003 2004   stomach and arm  . STEROID INJECTION TO SCAR     x 2 in back   . TOTAL KNEE ARTHROPLASTY Left 04/03/2015   Procedure: LEFT TOTAL KNEE ARTHROPLASTY;  Surgeon: PMelrose Nakayama MD;  [place]  Service: Orthopedics;  Laterality: Left;  . WISDOM TOOTH EXTRACTION      There were no vitals filed for this visit.      Subjective Assessment - 03/30/17 1335    Subjective Going good . Ultrasound helped more than anything else that has been done.    She reports pain eased  to almost nothing and stayed for a couple of days.  Was  able to get out of bed without efeort and that has not been the case .    Feels dry needling has helped.    Currently in Pain? No/denies            [facility] PT Assessment - 03/30/17 0001      AROM   Cervical Flexion 52   Cervical Extension 40   Cervical - Right Side Bend 34   Cervical - Left Side Bend 34   Cervical - Right Rotation 40   Cervical - Left Rotation 40     Strength   Overall Strength Comments WNL both UE except RT shoulder flexion/abduction and ER 4/5      All HEP were reviewed and new NAGS stretching added. She was able to demo all HEP correctly with minor cues for technique but she should still benefit from doing exercises even if not perfect.                        PT Education - 03/30/17 1440    Education provided Yes   Education Details toracic extension with belt and NAGS with toewl for neck rotation   Person(s) Educated Patient   Methods Explanation;Demonstration;Verbal cues;Handout;Tactile cues    Comprehension Returned demonstration;Verbalized understanding          PT Short Term Goals - 03/16/17 1426      PT SHORT TERM GOAL #1   Title She will be independent with inital HEP   Time 2   Period Weeks   Status Achieved     PT SHORT TERM GOAL #2   Title She will report pain decr 25 for general activity at home    Time 3   Period Weeks   Status Achieved     PT SHORT TERM GOAL #3   Title She will improve cervical rotation to 50 degrees bilaterally to see behind in car   Time 3   Period Weeks   Status Partially Met     PT SHORT TERM GOAL #4   Title she will demo understanding of good posture   Time 3   Period Weeks   Status Achieved           PT Long Term Goals - 03/30/17 1447      PT LONG TERM GOAL #1   Title She will be independent with all HEP issued    Baseline still needs minor cues but is able to demo all HEP   Status Partially Met     PT LONG TERM GOAL #2   Title She will report pain as intermittant in RT neck   Status Achieved     PT LONG TERM GOAL #3   Title She will improve BERG score to 54/56 to show improveent in balance.    Status Achieved     PT LONG TERM GOAL #4   Title She will report able to see traffic with 50% decreased pain    Baseline self reported 30% improvement   Status Partially Met     PT LONG TERM GOAL #5   Title she will have 4-5 days straight with 1-2 max painin neck to demo sustained improvement overall    Time 3   Period Weeks   Status New               Plan - 03/30/17 1329    Clinical Impression Statement No neck pain after session. Ms Deschamps reports overall improvement and today reported  no pain with ability to get out of bed with min pain and effort. It is unlikely she will improved her ROM and posture but She feels PT is helping and we had a discussion about limits of PT and maintenance PT  was not covered by Medicare. Since she is reportedly improving we will extend to 04/17/17.  4 sessions with DN and [place]   included and see if she has any sustained improvement and decide on discharge at that time.   PT Treatment/Interventions Iontophoresis 1m/ml Dexamethasone;Moist Heat;Traction;Ultrasound;Passive range of motion;Patient/family education;Taping;Dry needling;Therapeutic exercise;Therapeutic activities;Manual techniques;Balance training   PT Next Visit Plan DN/US and manual for neck pain.     PT Home Exercise Plan Supine scap stabilization yellow band , upper trap stretch, levator stretch, Tai Chi , thoracic mobility, thoracic extension seated , open book    Consulted and Agree with Plan of Care Patient      Patient will benefit from skilled therapeutic intervention in order to improve the following deficits and impairments:  Postural dysfunction, Decreased strength, Decreased activity tolerance, Decreased balance, Pain, Increased muscle spasms, Decreased range of motion, Difficulty walking  Visit Diagnosis: Cervicalgia - Plan: PT plan of care cert/re-cert  Cramp and spasm - Plan: PT plan of care cert/re-cert  Abnormal posture - Plan: PT plan of care cert/re-cert  Stiffness of cervical spine - Plan: PT plan of care cert/re-cert  Muscle weakness (generalized) - Plan: PT plan of care cert/re-cert  Cervical dystonia - Plan: PT plan of care cert/re-cert     Problem List Patient Active Problem List   Diagnosis Date Noted  . Torticollis, acquired 10/23/2016  . Asthma 08/04/2016  . Other [place] hypertension [place] 06/11/2016  . 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 Chronic migraine without aura without status migrainosus, not intractable 03/19/2016  . History of gastric bypass 12/05/2015  . RLS (restless legs syndrome) 08/16/2015  . Vitamin B12 deficiency anemia due to intrinsic factor deficiency 08/16/2015  . Iron deficiency anemia 08/16/2015  . Osteoporosis 08/16/2015  . Primary osteoarthritis of left knee 04/03/2015  . Obstructive sleep apnea 10/19/2014  . Congenital  anomaly of coronary artery 10/19/2014  . Hyperlipidemia 10/19/2014  . Morbid obesity ([place] 10/19/2014    CDarrel Hoover PT 03/30/2017, 2:52 PM  [facility] StreetGEast [place] [address]Phone: [phone]  Fax:  [phone] Name: CTasfia VasseurCore MRN: [id number] of Birth: 826-Jan-1947

## 2017-03-30 NOTE — Patient Instructions (Signed)
Issued from cabinet NAGS with towel for cervical rotation   1-2x/day 5-10 reps  To tolerance no pain.

## 2017-04-01 ENCOUNTER — Ambulatory Visit: Payer: Medicare Other | Admitting: Physical Therapy

## 2017-04-01 ENCOUNTER — Encounter: Payer: Self-pay | Admitting: Physical Therapy

## 2017-04-01 DIAGNOSIS — M6281 Muscle weakness (generalized): Secondary | ICD-10-CM

## 2017-04-01 DIAGNOSIS — G243 Spasmodic torticollis: Secondary | ICD-10-CM

## 2017-04-01 DIAGNOSIS — R293 Abnormal posture: Secondary | ICD-10-CM

## 2017-04-01 DIAGNOSIS — M542 Cervicalgia: Secondary | ICD-10-CM | POA: Diagnosis not present

## 2017-04-01 DIAGNOSIS — R252 Cramp and spasm: Secondary | ICD-10-CM

## 2017-04-01 DIAGNOSIS — M436 Torticollis: Secondary | ICD-10-CM | POA: Diagnosis not present

## 2017-04-01 NOTE — Therapy (Signed)
[place], [place], [address] Phone: [phone]   Fax:  [phone]  Physical Therapy Treatment  Patient Details  Name: [name] MRN: [id number] Date of Birth: Feb 12, 1946 Referring Provider: Sarina Ill, MD  Encounter Date: 04/01/2017      PT End of Session - 04/01/17 1249    Visit Number 13   Number of Visits 16   Date for PT Re-Evaluation 04/17/17   PT Start Time 1150   PT Stop Time 1233   PT Time Calculation (min) 43 min   Activity Tolerance Patient tolerated treatment well   Behavior During Therapy [facility] for tasks assessed/performed      Past Medical History:  Diagnosis Date  . 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 Arthritis    generalized  . Asthma   . Atrophic vaginitis   . Back pain    DDD/stenosis  . Carotid stenosis    Carotid US 10/16: Plaque RICA (1-70%), normal LICA  . Depression    takes Cymbalta daily  . Diverticulosis    benign  . DJD (degenerative joint disease)   . Dyslipidemia   . Dysrhythmia   . Family history of GI bleeding   . GERD (gastroesophageal reflux disease)    takes Nexium daily  . Gestational diabetes   . H/O hiatal hernia    surgery for hernia  . Headache(784.0)    takes Imitrex daily as needed and Bisoprolol daily;last migraine was about 2wks ago  . History of bronchitis 1 yr ago  . History of shingles   . Insomnia    takes Ambien nightly  . Joint pain   . Joint swelling   . Leg cramps    takes Flexeril daily as needed  . 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 Nocturia   . Osteoporosis   . Peripheral neuropathy    takes Gabapentin daily  . Pneumonia 59yr ago   hx of  . Restless leg syndrome    takes Requip daily  . RLS (restless legs syndrome) 08/16/2015  . Sleep apnea    uses CPAP  . Tubular adenoma of colon   . Vertigo   . Vitamin D deficiency     Past Surgical History:  Procedure Laterality Date  . COLONOSCOPY     . EP IMPLANTABLE DEVICE N/A 11/19/2016   Procedure: Loop Recorder Insertion;  Surgeon: SDeboraha Sprang MD;  [facility] [facility];  Service: Cardiovascular;  Laterality: N/A;  . ESOPHAGOGASTRODUODENOSCOPY    . excess skin removal     post weight loss  . GASTRIC BYPASS  2001  . HERNIA REPAIR     umbilical  . JOINT REPLACEMENT Right    knee  . skin reduction  2003 2004   stomach and arm  . STEROID INJECTION TO SCAR     x 2 in back   . TOTAL KNEE ARTHROPLASTY Left 04/03/2015   Procedure: LEFT TOTAL KNEE ARTHROPLASTY;  Surgeon: PMelrose Nakayama MD;  [place]  Service: Orthopedics;  Laterality: Left;  . WISDOM TOOTH EXTRACTION      There were no vitals filed for this visit.      Subjective Assessment - 04/01/17 1154    Subjective " the DN and the [place] help and work good, I havent had any pain for about a week until this morning"    Currently in Pain? Yes   Pain Score 7  Pain Location Neck   Pain Orientation Right   Pain Descriptors / Indicators Aching   Pain Type Chronic pain   Pain Onset More than a month ago   Pain Frequency Occasional   Aggravating Factors  possible sleeping wrong,    Pain Relieving Factors heat, exercise                         [facility] Adult PT Treatment/Exercise - 04/01/17 1244      Ultrasound   Ultrasound Location bil upper trap  using biofreeze   Ultrasound Parameters 1.5 w/cm2 x 10 min 100%   5 min on R/ 5 min on L   Ultrasound Goals Pain;Other (Comment)     Manual Therapy   Joint Mobilization T1-T9 Grade 3 PA   Soft tissue mobilization IASTM ov erthe R upper trap, sub-occipitals and cervical multifidus    Myofascial Release fascial stretching/ rolling over the L upper trap and sub-occipitals     Neck Exercises: Stretches   Upper Trapezius Stretch 4 reps;30 seconds  bil performed during [place]          Trigger Point Dry Needling - 04/01/17 1155    Consent Given? Yes   Education Handout Provided Yes    Upper Trapezius Response Twitch reponse elicited;Palpable increased muscle length  bil   Levator Scapulae Response Twitch response elicited;Palpable increased muscle length  bil   Longissimus Response Twitch response elicited;Palpable increased muscle length  bil C3-C6-C7                PT Short Term Goals - 03/16/17 1426      PT SHORT TERM GOAL #1   Title She will be independent with inital HEP   Time 2   Period Weeks   Status Achieved     PT SHORT TERM GOAL #2   Title She will report pain decr 25 for general activity at home    Time 3   Period Weeks   Status Achieved     PT SHORT TERM GOAL #3   Title She will improve cervical rotation to 50 degrees bilaterally to see behind in car   Time 3   Period Weeks   Status Partially Met     PT SHORT TERM GOAL #4   Title she will demo understanding of good posture   Time 3   Period Weeks   Status Achieved           PT Long Term Goals - 03/30/17 1447      PT LONG TERM GOAL #1   Title She will be independent with all HEP issued    Baseline still needs minor cues but is able to demo all HEP   Status Partially Met     PT LONG TERM GOAL #2   Title She will report pain as intermittant in RT neck   Status Achieved     PT LONG TERM GOAL #3   Title She will improve BERG score to 54/56 to show improveent in balance.    Status Achieved     PT LONG TERM GOAL #4   Title She will report able to see traffic with 50% decreased pain    Baseline self reported 30% improvement   Status Partially Met     PT LONG TERM GOAL #5   Title she will have 4-5 days straight with 1-2 max painin neck to demo sustained improvement overall    Time 3   Period  Weeks   Status New               Plan - 04/01/17 1249    Clinical Impression Statement reported 7/10 pain today but previously had no pain for over a week. conitnued TPDN over bil upper trap, cervical paraspinals at C3,C5,C7 followed with soft tissue work. continued [place]  combined with stretching which she reported signifcant relief.    PT Next Visit Plan DN/US and manual for neck pain, lower trap strengthening, exercise to enforce posture   PT Home Exercise Plan Supine scap stabilization yellow band , upper trap stretch, levator stretch, Tai Chi , thoracic mobility, thoracic extension seated , open book    Consulted and Agree with Plan of Care Patient      Patient will benefit from skilled therapeutic intervention in order to improve the following deficits and impairments:  Postural dysfunction, Decreased strength, Decreased activity tolerance, Decreased balance, Pain, Increased muscle spasms, Decreased range of motion, Difficulty walking  Visit Diagnosis: Cervicalgia  Cramp and spasm  Abnormal posture  Stiffness of cervical spine  Muscle weakness (generalized)  Cervical dystonia     Problem List Patient Active Problem List   Diagnosis Date Noted  . Torticollis, acquired 10/23/2016  . Asthma 08/04/2016  . Other secondary pulmonary hypertension ([place]) 06/11/2016  . 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 Chronic migraine without aura without status migrainosus, not intractable 03/19/2016  . History of gastric bypass 12/05/2015  . RLS (restless legs syndrome) 08/16/2015  . Vitamin B12 deficiency anemia due to intrinsic factor deficiency 08/16/2015  . Iron deficiency anemia 08/16/2015  . Osteoporosis 08/16/2015  . Primary osteoarthritis of left knee 04/03/2015  . Obstructive sleep apnea 10/19/2014  . 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 Hyperlipidemia 10/19/2014  . Morbid obesity ([place]) 10/19/2014   Starr Lake PT, DPT, LAT, ATC  04/01/17  12:53 PM      [place] [facility] [address] [place], [place], [address] Phone: [phone]   Fax:  [phone]  Name: [name] MRN: [id number] Date of Birth: 1946-11-28

## 2017-04-06 ENCOUNTER — Ambulatory Visit: Payer: Medicare Other | Admitting: Physical Therapy

## 2017-04-06 ENCOUNTER — Encounter: Payer: Self-pay | Admitting: Physical Therapy

## 2017-04-06 DIAGNOSIS — M436 Torticollis: Secondary | ICD-10-CM | POA: Diagnosis not present

## 2017-04-06 DIAGNOSIS — M6281 Muscle weakness (generalized): Secondary | ICD-10-CM

## 2017-04-06 DIAGNOSIS — R252 Cramp and spasm: Secondary | ICD-10-CM | POA: Diagnosis not present

## 2017-04-06 DIAGNOSIS — M542 Cervicalgia: Secondary | ICD-10-CM | POA: Diagnosis not present

## 2017-04-06 DIAGNOSIS — R293 Abnormal posture: Secondary | ICD-10-CM

## 2017-04-06 DIAGNOSIS — G243 Spasmodic torticollis: Secondary | ICD-10-CM

## 2017-04-06 NOTE — Therapy (Signed)
[place], [place], [address] Phone: [phone]   Fax:  [phone]  Physical Therapy Treatment  Patient Details  Name: [name] MRN: [id number] Date of Birth: [birth year] Referring Provider: Sarina Ill, MD  Encounter Date: 04/06/2017      PT End of Session - 04/06/17 1501    Visit Number 14   Number of Visits 16   Date for PT Re-Evaluation 04/17/17   PT Start Time 5537   PT Stop Time 1416   PT Time Calculation (min) 43 min   Activity Tolerance Patient tolerated treatment well   Behavior During Therapy [facility] for tasks assessed/performed      Past Medical History:  Diagnosis Date  . 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 Arthritis    generalized  . Asthma   . Atrophic vaginitis   . Back pain    DDD/stenosis  . Carotid stenosis    Carotid US 10/16: Plaque RICA (4-82%), normal LICA  . Depression    takes Cymbalta daily  . Diverticulosis    benign  . DJD (degenerative joint disease)   . Dyslipidemia   . Dysrhythmia   . Family history of GI bleeding   . GERD (gastroesophageal reflux disease)    takes Nexium daily  . Gestational diabetes   . H/O hiatal hernia    surgery for hernia  . Headache(784.0)    takes Imitrex daily as needed and Bisoprolol daily;last migraine was about 2wks ago  . History of bronchitis 1 yr ago  . History of shingles   . Insomnia    takes Ambien nightly  . Joint pain   . Joint swelling   . Leg cramps    takes Flexeril daily as needed  . 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 Nocturia   . Osteoporosis   . Peripheral neuropathy    takes Gabapentin daily  . Pneumonia 81yr ago   hx of  . Restless leg syndrome    takes Requip daily  . RLS (restless legs syndrome) 08/16/2015  . Sleep apnea    uses CPAP  . Tubular adenoma of colon   . Vertigo   . Vitamin D deficiency     Past Surgical History:  Procedure Laterality Date  . COLONOSCOPY     . EP IMPLANTABLE DEVICE N/A 11/19/2016   Procedure: Loop Recorder Insertion;  Surgeon: SDeboraha Sprang MD;  [facility];  Service: Cardiovascular;  Laterality: N/A;  . ESOPHAGOGASTRODUODENOSCOPY    . excess skin removal     post weight loss  . GASTRIC BYPASS  2001  . HERNIA REPAIR     umbilical  . JOINT REPLACEMENT Right    knee  . skin reduction  2003 2004   stomach and arm  . STEROID INJECTION TO SCAR     x 2 in back   . TOTAL KNEE ARTHROPLASTY Left 04/03/2015   Procedure: LEFT TOTAL KNEE ARTHROPLASTY;  Surgeon: PMelrose Nakayama MD;  [place]  Service: Orthopedics;  Laterality: Left;  . WISDOM TOOTH EXTRACTION      There were no vitals filed for this visit.      Subjective Assessment - 04/06/17 1334    Subjective "Im having a bad day, my do chewed up my glasses. my neck is doing pretty well"    Currently in Pain? No/denies   Pain Type Chronic pain   Pain Onset More than a month  ago   Aggravating Factors  N/A                         [facility] Adult PT Treatment/Exercise - 04/06/17 1429      Neck Exercises: Seated   Shoulder ABduction Both  going to fatigue with red theraband     Shoulder Exercises: Seated   Other Seated Exercises lower trap strengtheing 2 x 10 with tan band  with manual resistance     Ultrasound   Ultrasound Location bil upper trap   Ultrasound Parameters 1.5 w/cm2 x 8 min @ 100%  4 min each shoulder   Ultrasound Goals Pain;Other (Comment)     Manual Therapy   Joint Mobilization T1-T9 Grade 3 PA   Soft tissue mobilization IASTM ov erthe R upper trap, sub-occipitals and cervical multifidus    Myofascial Release fascial stretching/ rolling over the L upper trap and sub-occipitals   [place] B upper trap inhibition           Trigger Point Dry Needling - 04/06/17 1337    Consent Given? Yes   Education Handout Provided Yes  given previously   Upper Trapezius Response Twitch reponse elicited;Palpable  increased muscle length   SubOccipitals Response Twitch response elicited;Palpable increased muscle length   Longissimus Response Palpable increased muscle length;Twitch response elicited  bil C5, C7, T1                PT Short Term Goals - 03/16/17 1426      PT SHORT TERM GOAL #1   Title She will be independent with inital HEP   Time 2   Period Weeks   Status Achieved     PT SHORT TERM GOAL #2   Title She will report pain decr 25 for general activity at home    Time 3   Period Weeks   Status Achieved     PT SHORT TERM GOAL #3   Title She will improve cervical rotation to 50 degrees bilaterally to see behind in car   Time 3   Period Weeks   Status Partially Met     PT SHORT TERM GOAL #4   Title she will demo understanding of good posture   Time 3   Period Weeks   Status Achieved           PT Long Term Goals - 03/30/17 1447      PT LONG TERM GOAL #1   Title She will be independent with all HEP issued    Baseline still needs minor cues but is able to demo all HEP   Status Partially Met     PT LONG TERM GOAL #2   Title She will report pain as intermittant in RT neck   Status Achieved     PT LONG TERM GOAL #3   Title She will improve BERG score to 54/56 to show improveent in balance.    Status Achieved     PT LONG TERM GOAL #4   Title She will report able to see traffic with 50% decreased pain    Baseline self reported 30% improvement   Status Partially Met     PT LONG TERM GOAL #5   Title she will have 4-5 days straight with 1-2 max painin neck to demo sustained improvement overall    Time 3   Period Weeks   Status New  Plan - 04/06/17 1502    Clinical Impression Statement pt reported 0/10 pain today, continued TPDN over bil upper trap and cervical paraspinals at C3-C5-C7 and T1. continued [place] with upper trap stretching performed concurrent to [place]. post session she reported no pain.    PT Next Visit Plan DN/US and manual for  neck pain, lower trap strengthening, exercise to enforce posture, ROM   PT Home Exercise Plan Supine scap stabilization yellow band , upper trap stretch, levator stretch, Tai Chi , thoracic mobility, thoracic extension seated , open book    Consulted and Agree with Plan of Care Patient      Patient will benefit from skilled therapeutic intervention in order to improve the following deficits and impairments:     Visit Diagnosis: Cervicalgia  Cramp and spasm  Abnormal posture  Stiffness of cervical spine  Muscle weakness (generalized)  Cervical dystonia     Problem List Patient Active Problem List   Diagnosis Date Noted  . Torticollis, acquired 10/23/2016  . Asthma 08/04/2016  . Other secondary pulmonary hypertension ([place]) 06/11/2016  . 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 Chronic migraine without aura without status migrainosus, not intractable 03/19/2016  . History of gastric bypass 12/05/2015  . RLS (restless legs syndrome) 08/16/2015  . Vitamin B12 deficiency anemia due to intrinsic factor deficiency 08/16/2015  . Iron deficiency anemia 08/16/2015  . Osteoporosis 08/16/2015  . Primary osteoarthritis of left knee 04/03/2015  . Obstructive sleep apnea 10/19/2014  . 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 Hyperlipidemia 10/19/2014  . Morbid obesity ([place]) 10/19/2014   Starr Lake PT, DPT, LAT, ATC  04/06/17  3:05 PM      [place] [facility] [address] [place], [place], [address] Phone: [phone]   Fax:  [phone]  Name: [name] MRN: [id number] Date of Birth: September 01, 1946

## 2017-04-20 ENCOUNTER — Ambulatory Visit (INDEPENDENT_AMBULATORY_CARE_PROVIDER_SITE_OTHER): Payer: Medicare Other | Admitting: *Deleted

## 2017-04-20 ENCOUNTER — Encounter: Payer: Self-pay | Admitting: Physical Therapy

## 2017-04-20 ENCOUNTER — Ambulatory Visit: Payer: Medicare Other | Attending: Neurology | Admitting: Physical Therapy

## 2017-04-20 DIAGNOSIS — M436 Torticollis: Secondary | ICD-10-CM | POA: Diagnosis not present

## 2017-04-20 DIAGNOSIS — M6281 Muscle weakness (generalized): Secondary | ICD-10-CM | POA: Diagnosis not present

## 2017-04-20 DIAGNOSIS — G243 Spasmodic torticollis: Secondary | ICD-10-CM | POA: Diagnosis not present

## 2017-04-20 DIAGNOSIS — M542 Cervicalgia: Secondary | ICD-10-CM | POA: Diagnosis not present

## 2017-04-20 DIAGNOSIS — R252 Cramp and spasm: Secondary | ICD-10-CM | POA: Diagnosis not present

## 2017-04-20 DIAGNOSIS — R293 Abnormal posture: Secondary | ICD-10-CM | POA: Diagnosis not present

## 2017-04-20 DIAGNOSIS — I639 Cerebral infarction, unspecified: Secondary | ICD-10-CM

## 2017-04-20 NOTE — Therapy (Signed)
[place], [place], [address] Phone: [phone]   Fax:  [phone]  Physical Therapy Treatment / discharge Note  Patient Details  Name: [name] MRN: [id number] Date of Birth: [birth year] Referring Provider: Sarina Ill, MD  Encounter Date: 04/20/2017      PT End of Session - 04/20/17 1158    Visit Number 15   Number of Visits 16   Date for PT Re-Evaluation 04/20/17   PT Start Time 1100   PT Stop Time 1133   PT Time Calculation (min) 33 min   Activity Tolerance Patient tolerated treatment well   Behavior During Therapy [facility] for tasks assessed/performed      Past Medical History:  Diagnosis Date  . 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 Arthritis    generalized  . Asthma   . Atrophic vaginitis   . Back pain    DDD/stenosis  . Carotid stenosis    Carotid US 10/16: Plaque RICA (1-44%), normal LICA  . Depression    takes Cymbalta daily  . Diverticulosis    benign  . DJD (degenerative joint disease)   . Dyslipidemia   . Dysrhythmia   . Family history of GI bleeding   . GERD (gastroesophageal reflux disease)    takes Nexium daily  . Gestational diabetes   . H/O hiatal hernia    surgery for hernia  . Headache(784.0)    takes Imitrex daily as needed and Bisoprolol daily;last migraine was about 2wks ago  . History of bronchitis 1 yr ago  . History of shingles   . Insomnia    takes Ambien nightly  . Joint pain   . Joint swelling   . Leg cramps    takes Flexeril daily as needed  . 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 Nocturia   . Osteoporosis   . Peripheral neuropathy    takes Gabapentin daily  . Pneumonia 48yr ago   hx of  . Restless leg syndrome    takes Requip daily  . RLS (restless legs syndrome) 08/16/2015  . Sleep apnea    uses CPAP  . Tubular adenoma of colon   . Vertigo   . Vitamin D deficiency     Past Surgical History:  Procedure Laterality Date   . COLONOSCOPY    . EP IMPLANTABLE DEVICE N/A 11/19/2016   Procedure: Loop Recorder Insertion;  Surgeon: SDeboraha Sprang MD;  [facility];  Service: Cardiovascular;  Laterality: N/A;  . ESOPHAGOGASTRODUODENOSCOPY    . excess skin removal     post weight loss  . GASTRIC BYPASS  2001  . HERNIA REPAIR     umbilical  . JOINT REPLACEMENT Right    knee  . skin reduction  2003 2004   stomach and arm  . STEROID INJECTION TO SCAR     x 2 in back   . TOTAL KNEE ARTHROPLASTY Left 04/03/2015   Procedure: LEFT TOTAL KNEE ARTHROPLASTY;  Surgeon: PMelrose Nakayama MD;  [facility]  Service: Orthopedics;  Laterality: Left;  . WISDOM TOOTH EXTRACTION      There were no vitals filed for this visit.      Subjective Assessment - 04/20/17 1105    Subjective "I am feeling really good with no issues in the neck/ head, I am feeling just a little bit of soreness today which is the first day.  I did fall and hit my wrist  and it is bruised."   Currently in Pain? Yes   Pain Score 1    Pain Location Neck   Pain Orientation Right   Pain Onset More than a month ago   Pain Frequency Rarely            [facility] PT Assessment - 04/20/17 1107      AROM   Cervical Flexion 52   Cervical Extension 45   Cervical - Right Side Bend 40   Cervical - Left Side Bend 36   Cervical - Right Rotation 60   Cervical - Left Rotation 45                     [facility] Adult PT Treatment/Exercise - 04/20/17 1154      Ultrasound   Ultrasound Location bil upper trap   Ultrasound Parameters 1.5 w/cm2 100% x 8 min  using bio freeze    Ultrasound Goals Pain;Other (Comment)     Neck Exercises: Stretches   Upper Trapezius Stretch 2 reps;30 seconds                PT Education - 04/20/17 1156    Education provided Yes   Education Details reviewed previously provided HEP, how to progress by increasing reps/ sets as well as provided upgraded resistance bands. benefits of consistency with  exercise and stretching.   Person(s) Educated Patient   Methods Explanation;Verbal cues   Comprehension Verbalized understanding;Verbal cues required          PT Short Term Goals - 04/20/17 1112      PT SHORT TERM GOAL #1   Title She will be independent with inital HEP   Time 2   Period Weeks   Status Achieved     PT SHORT TERM GOAL #2   Title She will report pain decr 25 for general activity at home    Time 3   Period Weeks   Status Achieved     PT SHORT TERM GOAL #3   Title She will improve cervical rotation to 50 degrees bilaterally to see behind in car   Time 3   Period Weeks     PT SHORT TERM GOAL #4   Title she will demo understanding of good posture   Time 3   Period Weeks   Status Achieved           PT Long Term Goals - 04/20/17 1113      PT LONG TERM GOAL #1   Title She will be independent with all HEP issued    Time 6   Period Weeks   Status Partially Met     PT LONG TERM GOAL #2   Title She will report pain as intermittant in RT neck   Time 6   Period Weeks   Status Achieved     PT LONG TERM GOAL #3   Title She will improve BERG score to 54/56 to show improveent in balance.    Time 6   Period Weeks   Status Achieved     PT LONG TERM GOAL #4   Title She will report able to see traffic with 50% decreased pain    Time 6   Period Weeks   Status Achieved     PT LONG TERM GOAL #5   Title she will have 4-5 days straight with 1-2 max painin neck to demo sustained improvement overall    Time 3   Period Weeks   Status Achieved  Plan - Apr 25, 2017 1159    Clinical Impression Statement [name] has made great progress with physical therapy, she reported no pain for the last 2 weeks with 1/10 pain noted today. utilizted [place] with stretching which she reported decrease pain and tightness. She has met or partially met all goals today. She is able to maintain and progress her current level of function independently and will be  discharged from PT today.    Rehab Potential Good   PT Next Visit Plan D/C   PT Home Exercise Plan Supine scap stabilization yellow band , upper trap stretch, levator stretch, Tai Chi , thoracic mobility, thoracic extension seated , open book    Consulted and Agree with Plan of Care Patient      Patient will benefit from skilled therapeutic intervention in order to improve the following deficits and impairments:  Postural dysfunction, Decreased strength, Decreased activity tolerance, Decreased balance, Pain, Increased muscle spasms, Decreased range of motion, Difficulty walking  Visit Diagnosis: Cervicalgia  Cramp and spasm  Abnormal posture  Stiffness of cervical spine  Muscle weakness (generalized)  Cervical dystonia       G-Codes - Apr 25, 2017 1256    Functional Limitation Changing and maintaining body position   Changing and Maintaining Body Position Current Status (T2458) At least 40 percent but less than 60 percent impaired, limited or restricted   Changing and Maintaining Body Position Goal Status (K9983) At least 20 percent but less than 40 percent impaired, limited or restricted   Changing and Maintaining Body Position Discharge Status (J8250) At least 20 percent but less than 40 percent impaired, limited or restricted   Functional Assessment Tool Used Clinical judgement      Problem List Patient Active Problem List   Diagnosis Date Noted  . Torticollis, acquired 10/23/2016  . Asthma 08/04/2016  . Other secondary pulmonary hypertension ([place]) 06/11/2016  . 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 Chronic migraine without aura without status migrainosus, not intractable 03/19/2016  . History of gastric bypass 12/05/2015  . RLS (restless legs syndrome) 08/16/2015  . Vitamin B12 deficiency anemia due to intrinsic factor deficiency 08/16/2015  . Iron deficiency anemia 08/16/2015  . Osteoporosis 08/16/2015  . Primary osteoarthritis of left knee 04/03/2015  . Obstructive  sleep apnea 10/19/2014  . 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 Hyperlipidemia 10/19/2014  . Morbid obesity ([place]) 10/19/2014   Starr Lake PT, DPT, LAT, ATC  04-25-2017  12:58 PM      [facility] [address] [place], [place], [address] Phone: [phone]   Fax:  [phone]  Name: [name] MRN: [id number] Date of Birth: 03-26-1946       PHYSICAL THERAPY DISCHARGE SUMMARY  Visits from Start of Care: 15  Current functional level related to goals / functional outcomes: See goals   Remaining deficits: Intermittent tightness in bil upper trap.    Education / Equipment: HEP, theraband, posture education,   Plan: Patient agrees to discharge.  Patient goals were partially met. Patient is being discharged due to being pleased with the current functional level.  ?????

## 2017-04-21 NOTE — Progress Notes (Signed)
Carelink Summary Report / Loop Recorder 

## 2017-04-22 ENCOUNTER — Ambulatory Visit: Payer: Medicare Other | Admitting: Neurology

## 2017-04-23 ENCOUNTER — Ambulatory Visit: Payer: Medicare Other | Admitting: Physical Therapy

## 2017-04-23 ENCOUNTER — Encounter: Payer: Self-pay | Admitting: Neurology

## 2017-04-23 ENCOUNTER — Ambulatory Visit (INDEPENDENT_AMBULATORY_CARE_PROVIDER_SITE_OTHER): Payer: Medicare Other | Admitting: Neurology

## 2017-04-23 VITALS — BP 128/72 | HR 67 | Ht 66.0 in | Wt 230.0 lb

## 2017-04-23 DIAGNOSIS — G243 Spasmodic torticollis: Secondary | ICD-10-CM

## 2017-04-23 DIAGNOSIS — S62308A Unspecified fracture of other metacarpal bone, initial encounter for closed fracture: Secondary | ICD-10-CM | POA: Diagnosis not present

## 2017-04-23 DIAGNOSIS — S63502A Unspecified sprain of left wrist, initial encounter: Secondary | ICD-10-CM | POA: Diagnosis not present

## 2017-04-23 DIAGNOSIS — S43401A Unspecified sprain of right shoulder joint, initial encounter: Secondary | ICD-10-CM | POA: Diagnosis not present

## 2017-04-23 MED ORDER — OXYCODONE-ACETAMINOPHEN 10-325 MG PO TABS: 1.0000 | ORAL_TABLET | Freq: Four times a day (QID) | ORAL | 0 refills | Status: DC | PRN

## 2017-04-24 DIAGNOSIS — S52592A Other fractures of lower end of left radius, initial encounter for closed fracture: Secondary | ICD-10-CM | POA: Diagnosis not present

## 2017-04-24 DIAGNOSIS — S62337A Displaced fracture of neck of fifth metacarpal bone, left hand, initial encounter for closed fracture: Secondary | ICD-10-CM | POA: Diagnosis not present

## 2017-04-27 ENCOUNTER — Encounter: Payer: Medicare Other | Admitting: Physical Therapy

## 2017-04-29 NOTE — Progress Notes (Signed)
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 She has significant pain, decreased ROM which is chronic for over one year. She gets regular massage. We have performed multiple trigger point injections with Depo-Medrol, lidocaine and Marcaine. She has tried baclofen, Cymbalta, oxycodone, Lyrica, gabapentin, Robaxin without any relief. Patient would benefit from Physical therapy for stretching, strengthening, manual therapy and massage as well as dry needling of the right trapezius muscle and any other muscles o modalities as clinically warranted by physical therapy evaluation   Procedure note for Spasmodic torticollis:  EMG: EMG guidance was used to inject muscles detailed below. Aseptic procedure was performed and patient tolerated procedure. Procedure was performed by Dr. Myrla Halsted   Patient tolerated the procedure.   Refractory to oral medications   Motrin / tylenol for injections site pain / soreness   REMS precautions handout given to patient   RTC - see instructions for details   Wasted 90 Units.   Dysport-500 units [id number] x 2 boxes   NDC [address]-8 Lot [id number] Exp 06/13/2017   DX: G24.3 Buy and Bill  Muscles injected: 100 in each right and left levator scapula(prox and distal) 260 in each right and left trapezius, 80 right semispinalis capitus, 80 right splenius capitus, 30 right pectoralis

## 2017-05-01 ENCOUNTER — Telehealth: Payer: Self-pay | Admitting: Neurology

## 2017-05-01 ENCOUNTER — Other Ambulatory Visit: Payer: Self-pay | Admitting: Neurology

## 2017-05-01 DIAGNOSIS — I6389 Other cerebral infarction: Secondary | ICD-10-CM

## 2017-05-01 NOTE — Telephone Encounter (Signed)
[name], can we place a specialty pharmacy oder for [facility] please for patient? We tried before and insurance declines because she had not tried some oter medication and now she has failed those so we should be ok thanks

## 2017-05-04 LAB — CUP PACEART REMOTE DEVICE CHECK
Implantable Pulse Generator Implant Date: 20171206
MDC IDC SESS DTM: 20180505154045

## 2017-05-04 NOTE — Telephone Encounter (Signed)
[place] enrollment form for [facility] completed, awaiting MD signature.

## 2017-05-05 NOTE — Telephone Encounter (Signed)
Signed and faxed to [facility] F # [phone].

## 2017-05-07 ENCOUNTER — Telehealth: Payer: Self-pay | Admitting: Neurology

## 2017-05-07 DIAGNOSIS — S52592A Other fractures of lower end of left radius, initial encounter for closed fracture: Secondary | ICD-10-CM | POA: Diagnosis not present

## 2017-05-07 DIAGNOSIS — S62337A Displaced fracture of neck of fifth metacarpal bone, left hand, initial encounter for closed fracture: Secondary | ICD-10-CM | POA: Diagnosis not present

## 2017-05-07 NOTE — Telephone Encounter (Signed)
Talked to pt who said that since Monday, she hasn't been able to hold her head up straight. Reports that her neck is "just leaning to one side." She has no difficulty speaking, swallowing or breathing. She received Dysport injections 2 wks ago for spasmodic torticollis. Currently denies pain. Her disorder could cause the neck to involuntarily turn but also let her know that muscle weakness is a possible side effect of Botox injections as well. Agreed to discuss w/ Dr. [name] and give her a call back. Says that she'll be leaving around 12:45 for an orthopedic appt w/ hand specialist to discuss possible surgery for injury s/p fall.

## 2017-05-07 NOTE — Telephone Encounter (Signed)
Pt said she unable to hold her head up since 5/21. She had botox inj on 5/10. Call transferred to RN

## 2017-05-07 NOTE — Telephone Encounter (Signed)
Discussed w/ Dr. [name] who said that less Dysport will need to be used w/ next injections. Muscle weakness is a side effect of Botox but should improve over time. Advised pt to call back w/ any further questions/concerns or if symptoms worsen.

## 2017-05-08 NOTE — Telephone Encounter (Signed)
Received call from patient who asked about how long she can expect to have the muscle weakness. She also stated she has an appointment today at 1 pm for dry needling on her head. She is asking if she should cancel that appt. She then asked if she could use a neck brace, and if so, would she need a prescription for that.  This RN advised iwll discuss with Dr [name] and hope to call her back before the office closes at noon. Patient stated she needs a call back because her needling appt is at one pm. This RN stated she would do her best to give her a call back before 1 pm. Patient verbalized understanding, appreciation.

## 2017-05-08 NOTE — Telephone Encounter (Signed)
LVM informing patient that she may have dry needling today, and she can pick up neck brace at medical supply store without a prescription. Advised her it should not be expensive. Advised her Dr [name] wanted to see her Tues to discuss, and there is a 12 noon work-in slot available. Advised she cannot call back today as the office is now closed, but she may send Anderson Malta RN a my chart message. Otherwise advised she call next Tues and schedule a follow up with Dr [name].

## 2017-05-08 NOTE — Telephone Encounter (Signed)
Can you schedule patient for Tuesday so I can discuss with her in person? She can still get dry needling and she can wear a neck brace if needed she can buy it at a medical equipment store without a prescription it not expensive.

## 2017-05-12 ENCOUNTER — Ambulatory Visit (INDEPENDENT_AMBULATORY_CARE_PROVIDER_SITE_OTHER): Payer: Medicare Other | Admitting: Neurology

## 2017-05-12 ENCOUNTER — Encounter: Payer: Self-pay | Admitting: Neurology

## 2017-05-12 VITALS — BP 123/65 | HR 67 | Ht 67.0 in | Wt 228.8 lb

## 2017-05-12 DIAGNOSIS — M5382 Other specified dorsopathies, cervical region: Secondary | ICD-10-CM | POA: Diagnosis not present

## 2017-05-12 DIAGNOSIS — I638 Other cerebral infarction: Secondary | ICD-10-CM

## 2017-05-12 MED ORDER — FUTURO SOFT CERVICAL COLLAR MISC: 1.0000 "application " | 0 refills | Status: DC | PRN

## 2017-05-12 NOTE — Progress Notes (Signed)
[facility]    Provider:  Dr [name] Referring Provider: [name], MD Primary Care Physician:  [name], MD  CC:  weakness  HPI:  [name] is a 71 y.o. female here as a referral from [name] for cervical dystonia. Patient's Dysport was increased and she returns for weakness. Her head feels weak but she can still hold it up. Feels like her head is falling. She can hold up her head it is just fatigued. But her muscles feel great.   Review of Systems: Patient complains of symptoms per HPI as well as the following symptoms: no pain, weakness of neck muscles. Pertinent negatives and positives per HPI. All others negative.   Social History   Social History  . Marital status: Married    Spouse name: Pilar Jarvis  . Number of children: 3  . Years of education: college   Occupational History  . Retired    Social History Main Topics  . Smoking status: Never Smoker  . Smokeless tobacco: Never Used  . Alcohol use No  . Drug use: No  . Sexual activity: Yes    Birth control/ protection: Post-menopausal   Other Topics Concern  . Not on file   Social History Narrative   Lives at home w/ her husband   Caffeine 8-10 cups daily.  (coffee 1 cup am, unsw tea all day long).     Family History  Problem Relation Age of Onset  . CAD Mother   . Hypertension Mother   . Neuropathy Mother   . Osteoarthritis Mother   . Heart disease Mother   . Breast cancer Maternal Grandmother   . CAD Paternal Grandfather   . Colon cancer Father     Past Medical History:  Diagnosis Date  . 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 Arthritis    generalized  . Asthma   . Atrophic vaginitis   . Back pain    DDD/stenosis  . Carotid stenosis    Carotid US 10/16: Plaque RICA (0-35%), normal LICA  . Depression    takes Cymbalta daily  . Diverticulosis    benign  . DJD (degenerative joint disease)   . Dyslipidemia   . Dysrhythmia   . Family history of GI  bleeding   . GERD (gastroesophageal reflux disease)    takes Nexium daily  . Gestational diabetes   . H/O hiatal hernia    surgery for hernia  . Headache(784.0)    takes Imitrex daily as needed and Bisoprolol daily;last migraine was about 2wks ago  . History of bronchitis 1 yr ago  . History of shingles   . Insomnia    takes Ambien nightly  . Joint pain   . Joint swelling   . Leg cramps    takes Flexeril daily as needed  . 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 Nocturia   . Osteoporosis   . Peripheral neuropathy    takes Gabapentin daily  . Pneumonia 36yrs ago   hx of  . Restless leg syndrome    takes Requip daily  . RLS (restless legs syndrome) 08/16/2015  . Sleep apnea    uses CPAP  . Tubular adenoma of colon   . Vertigo   . Vitamin D deficiency     Past Surgical History:  Procedure Laterality Date  . COLONOSCOPY    . EP IMPLANTABLE DEVICE N/A 11/19/2016   Procedure: Loop Recorder Insertion;  Surgeon: Deboraha Sprang, MD;  Location: [facility];  Service: Cardiovascular;  Laterality: N/A;  . ESOPHAGOGASTRODUODENOSCOPY    . excess skin removal     post weight loss  . GASTRIC BYPASS  2001  . HERNIA REPAIR     umbilical  . JOINT REPLACEMENT Right    knee  . skin reduction  2003 2004   stomach and arm  . STEROID INJECTION TO SCAR     x 2 in back   . TOTAL KNEE ARTHROPLASTY Left 04/03/2015   Procedure: LEFT TOTAL KNEE ARTHROPLASTY;  Surgeon: Melrose Nakayama, MD;  Location: [facility];  Service: Orthopedics;  Laterality: Left;  . WISDOM TOOTH EXTRACTION      Current Outpatient Prescriptions  Medication Sig Dispense Refill  . amphetamine-dextroamphetamine (ADDERALL) 10 MG tablet Take 10 mg by mouth daily with breakfast.   0  . baclofen (LIORESAL) 10 MG tablet Take 10 mg by mouth 2 (two) times daily.    . bisoprolol (ZEBETA) 10 MG tablet Take 10 mg by mouth 2 (two) times daily.    . calcium carbonate (OS-CAL) 1250 (500 CA) MG chewable tablet Chew 1 tablet by mouth daily.  Not sure of dosage    . Diclofenac Potassium (CAMBIA) 50 MG PACK Take 50 mg by mouth at bedtime as needed. 1 each 11  . diclofenac sodium (VOLTAREN) 1 % GEL Apply 2 g topically 4 (four) times daily as needed (pain). 200 g 11  . DULoxetine (CYMBALTA) 60 MG capsule Take 60 mg by mouth 2 (two) times daily.  11  . esomeprazole (NEXIUM) 40 MG capsule Take 40 mg by mouth 2 (two) times daily before a meal.   6  . Evolocumab (REPATHA SURECLICK) 425 MG/ML SOAJ Inject 1 Dose into the skin every 14 (fourteen) days. Every other week     . flurbiprofen (ANSAID) 100 MG tablet TAKE 1 TABLET(100 MG) BY MOUTH THREE TIMES DAILY AS NEEDED 60 tablet 3  . furosemide (LASIX) 20 MG tablet Take 40 mg by mouth daily.    . Loratadine (CLARITIN) 10 MG CAPS Take 1 capsule by mouth as needed (for allergies).     Marland Kitchen LYRICA 100 MG capsule Take 1 capsule (100 mg total) by mouth 2 (two) times daily. 60 capsule 11  . ondansetron (ZOFRAN ODT) 4 MG disintegrating tablet Take 1 tablet (4 mg total) by mouth every 8 (eight) hours as needed for nausea or vomiting. 45 tablet 11  . oxyCODONE-acetaminophen (PERCOCET) 10-325 MG tablet Take 1 tablet by mouth every 6 (six) hours as needed for pain. 60 tablet 0  . PROAIR HFA 108 (90 Base) MCG/ACT inhaler 2 INHALATIONS EVERY 4 HOURS AS NEEDED FOR ASTHMA SYMPTOMS (WHEEZING/SHORTNESS OF BREATH)  5  . quiNINE (QUALAQUIN) 324 MG capsule Take 1 capsule (324 mg total) by mouth 2 (two) times daily. 180 capsule 3  . rOPINIRole (REQUIP) 1 MG tablet Take 1 tablet (1 mg total) by mouth 2 (two) times daily before lunch and supper. 180 tablet 3  . spironolactone (ALDACTONE) 25 MG tablet Take 1 tablet (25 mg total) by mouth 2 (two) times daily. 60 tablet 11  . topiramate (TOPAMAX) 100 MG tablet Take 2 tablets (200 mg total) by mouth at bedtime. 60 tablet 11  . Vitamin D, Ergocalciferol, (DRISDOL) 50000 units CAPS capsule Take 1 capsule (50,000 Units total) by mouth every 7 (seven) days. 12 capsule 4  .  zolpidem (AMBIEN) 10 MG tablet Take 1 tablet (10 mg total) by mouth at bedtime. 30 tablet 4  No current facility-administered medications for this visit.     Allergies as of 05/12/2017  . (No Known Allergies)    Vitals: BP 123/65   Pulse 67   Ht 5\' 7"  (1.702 m)   Wt 228 lb 12.8 oz (103.8 kg)   BMI 35.84 kg/m  Last Weight:  Wt Readings from Last 1 Encounters:  05/12/17 228 lb 12.8 oz (103.8 kg)   Last Height:   Ht Readings from Last 1 Encounters:  05/12/17 5\' 7"  (1.702 m)    Physical exam: Exam: Gen: NAD, conversant, well nourised, obese, well groomed                     CV: RRR, no MRG. No Carotid Bruits. No peripheral edema, warm, nontender Eyes: Conjunctivae clear without exudates or hemorrhage MSK: weakness of head extension 3/5 otherwise intact strength  Neuro: Detailed Neurologic Exam  Speech:    Speech is normal; fluent and spontaneous with normal comprehension.  Cognition:    The patient is oriented to person, place, and time;   Cranial Nerves:    The pupils are equal, round, and reactive to light. Visual fields are full to finger confrontation. Extraocular movements are intact. Trigeminal sensation is intact and the muscles of mastication are normal. The face is symmetric. The palate elevates in the midline. Hearing intact. Voice is normal. Shoulder shrug is normal. The tongue has normal motion without fasciculations.       Assessment/Plan:   Patient with weakness of head extension after increase of botulinum therapy. Discussed with patient this is temporary and will improve. We'll give her a soft collar. Patient says she feels great and her muscles feel wonderful. At last appointment we used 900 units of Dysport discussed going back to previous dose or something in the middle. We determined we will try something in the middle which may still cause weakness but patient is willing to attempt. Will decrease to 700 units of Dysport   Sarina Ill, MD  [facility] [address] [place], [place] [address]  Phone [phone] Fax [phone]  A total of 5 minutes was spent face-to-face with this patient. Over half this time was spent on counseling patient on the neck weakness diagnosis and different diagnostic and therapeutic options available.

## 2017-05-18 ENCOUNTER — Ambulatory Visit (INDEPENDENT_AMBULATORY_CARE_PROVIDER_SITE_OTHER): Payer: Medicare Other | Admitting: *Deleted

## 2017-05-18 DIAGNOSIS — I639 Cerebral infarction, unspecified: Secondary | ICD-10-CM | POA: Diagnosis not present

## 2017-05-19 NOTE — Progress Notes (Signed)
Carelink Summary Report / Loop Recorder 

## 2017-05-20 LAB — CUP PACEART REMOTE DEVICE CHECK
Date Time Interrogation Session: 20180604164835
Implantable Pulse Generator Implant Date: 20171206

## 2017-06-08 DIAGNOSIS — S62337A Displaced fracture of neck of fifth metacarpal bone, left hand, initial encounter for closed fracture: Secondary | ICD-10-CM | POA: Diagnosis not present

## 2017-06-08 DIAGNOSIS — S52592A Other fractures of lower end of left radius, initial encounter for closed fracture: Secondary | ICD-10-CM | POA: Diagnosis not present

## 2017-06-09 ENCOUNTER — Ambulatory Visit (INDEPENDENT_AMBULATORY_CARE_PROVIDER_SITE_OTHER): Payer: Medicare Other | Admitting: Neurology

## 2017-06-09 DIAGNOSIS — G43711 Chronic migraine without aura, intractable, with status migrainosus: Secondary | ICD-10-CM

## 2017-06-09 MED ORDER — ZOLPIDEM TARTRATE 10 MG PO TABS: 10.0000 mg | ORAL_TABLET | Freq: Every day | ORAL | 0 refills | Status: DC

## 2017-06-09 NOTE — Progress Notes (Signed)

## 2017-06-18 ENCOUNTER — Ambulatory Visit (INDEPENDENT_AMBULATORY_CARE_PROVIDER_SITE_OTHER): Payer: Medicare Other | Admitting: *Deleted

## 2017-06-18 DIAGNOSIS — I639 Cerebral infarction, unspecified: Secondary | ICD-10-CM

## 2017-06-18 NOTE — Progress Notes (Signed)
Carelink Summary Report / Loop Recorder 

## 2017-06-22 LAB — CUP PACEART REMOTE DEVICE CHECK
Implantable Pulse Generator Implant Date: 20171206
MDC IDC SESS DTM: 20180704161044

## 2017-07-05 IMAGING — US US EXTREM LOW*R* LIMITED
1 series · 8 of 8 positions shown · non-contrast
Comparison: None.

CLINICAL DATA: 71-year-old female with palpable area superficial
aspect lateral distal right thigh for the past month. Initial
encounter.

EXAM:
ULTRASOUND right LOWER EXTREMITY LIMITED
TECHNIQUE: Ultrasound examination of the lower extremity soft tissues was
performed in the area of clinical concern.

[Series 1: us extrem low*right* limited · 0.06mm/px · 8 acquisitions, 8 frames shown]
[im 1/8]
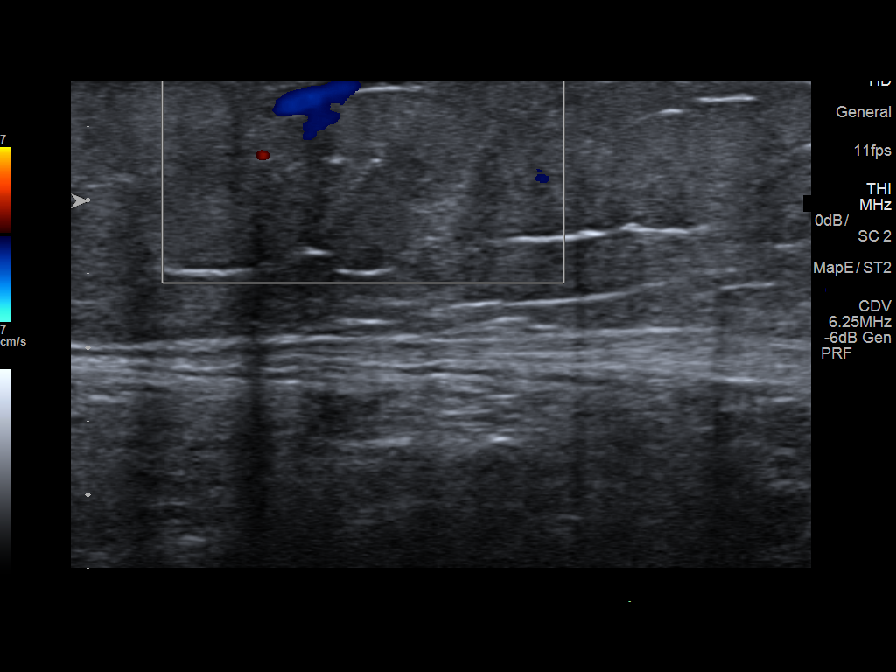
[im 2/8]
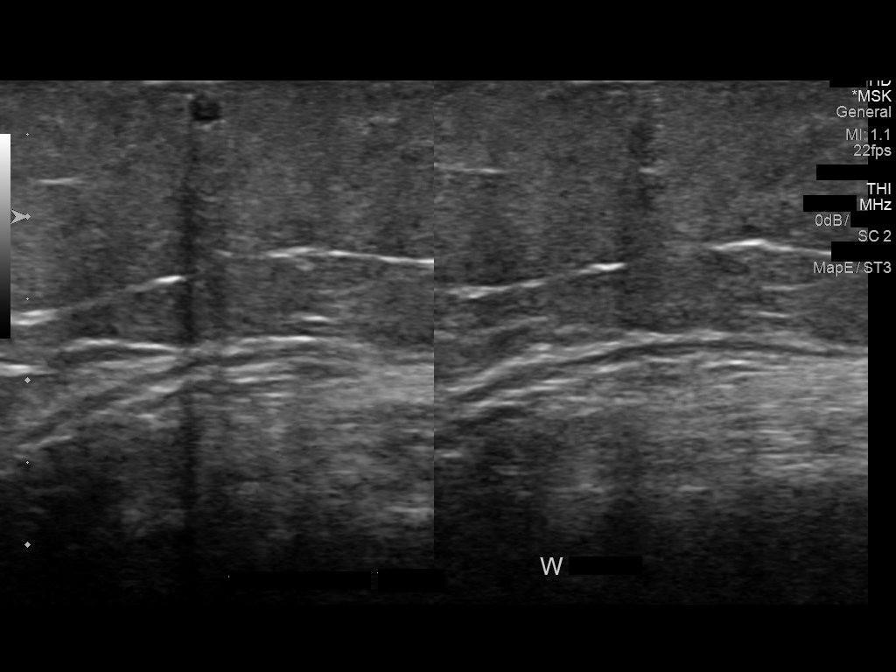
[im 3/8]
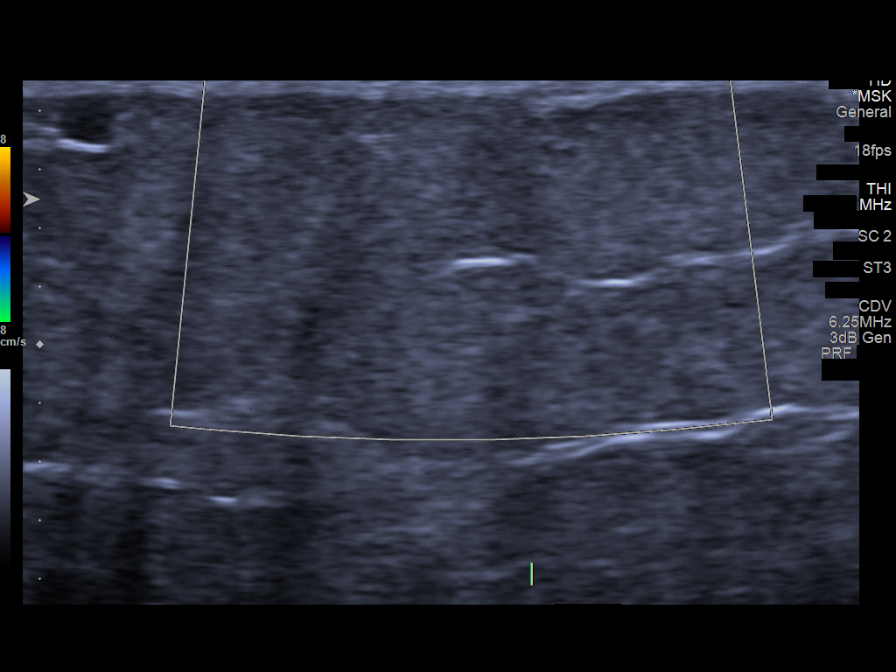
[im 4/8]
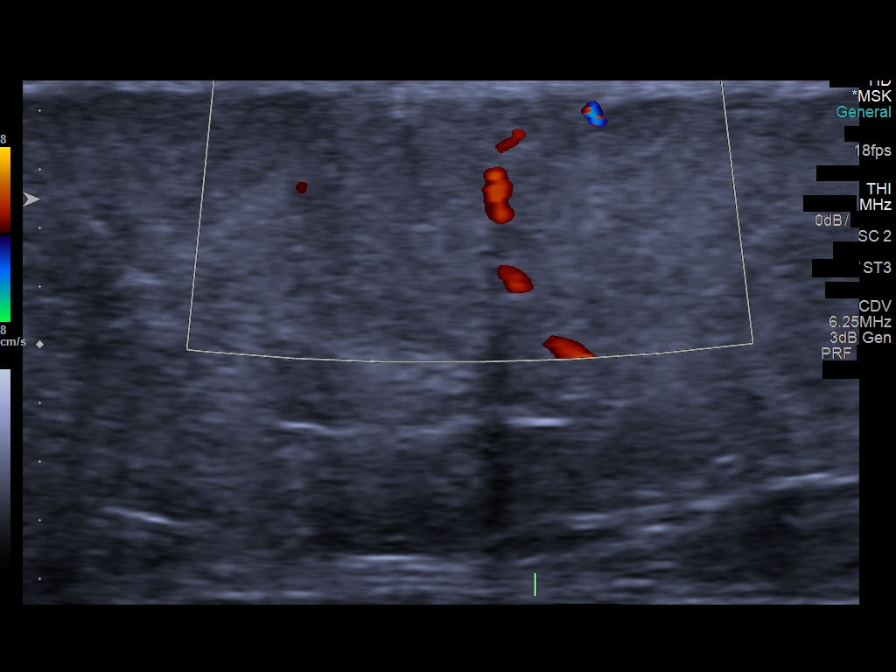
[im 5/8]
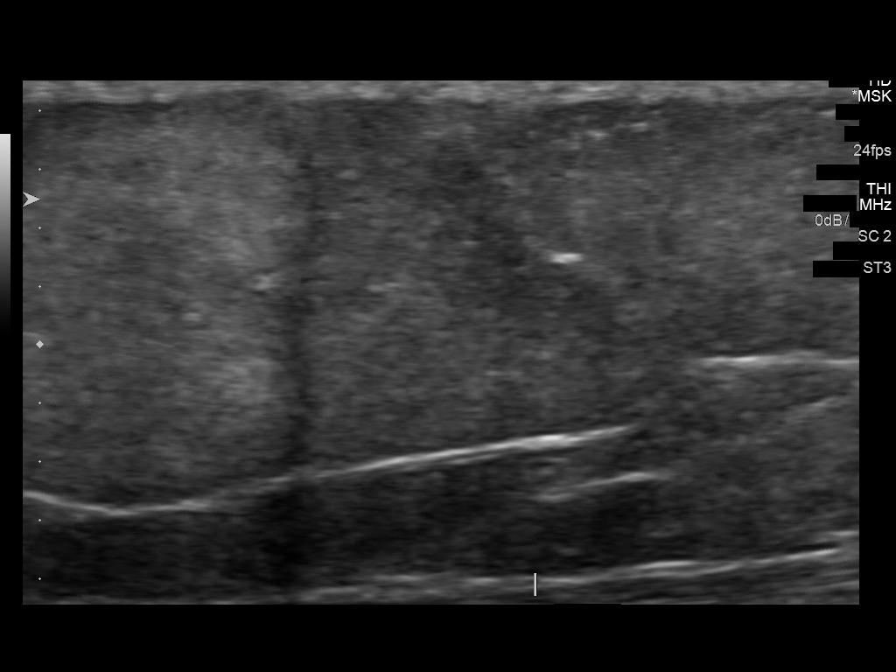
[im 6/8]
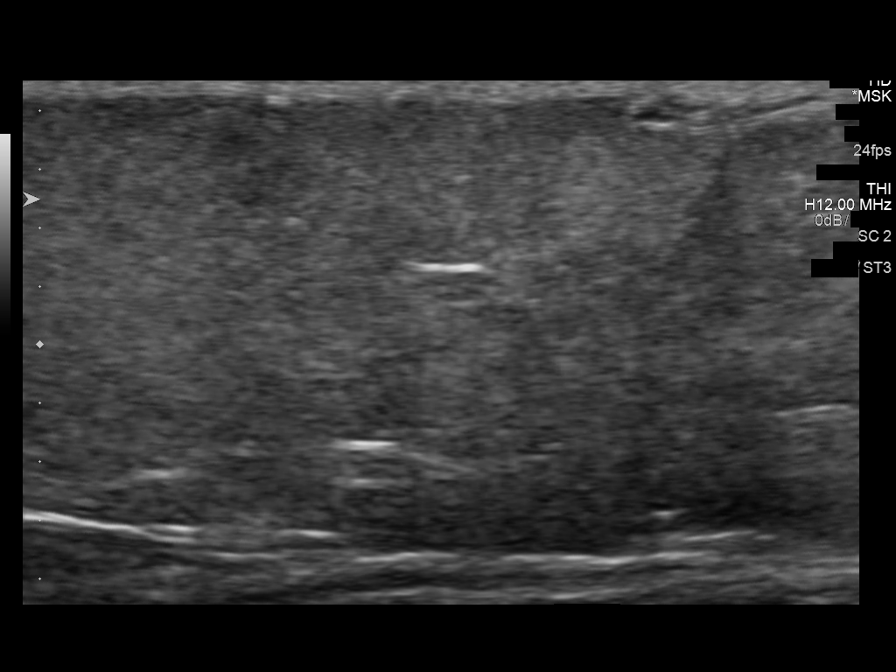
[im 7/8]
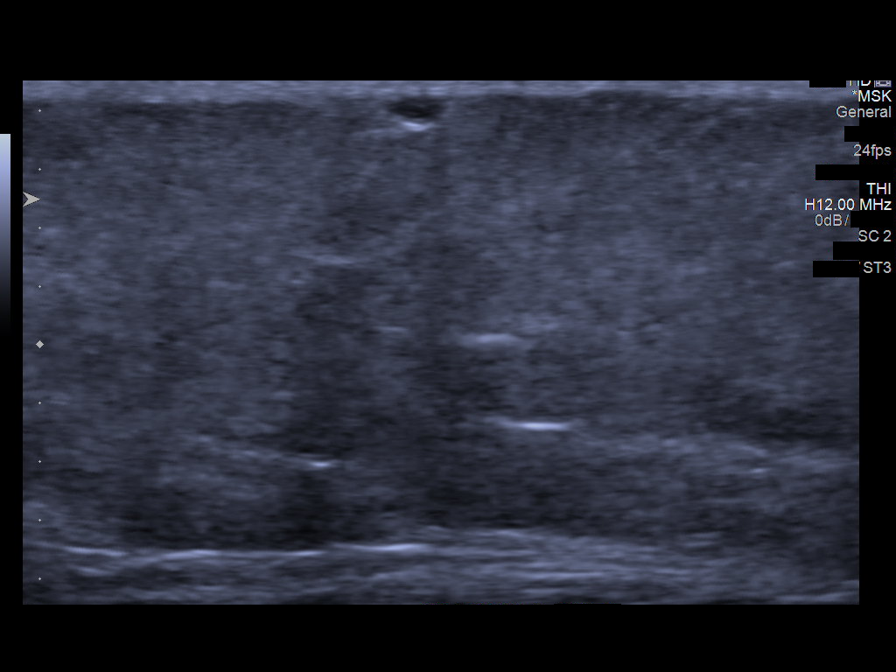
[im 8/8]
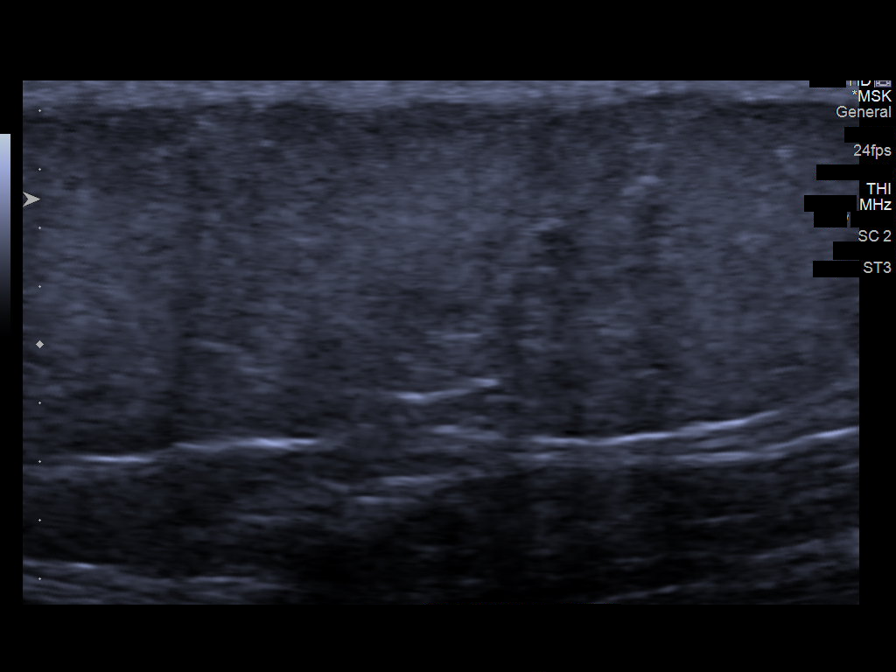

[8 of 8 positions shown; findings below may reference images not displayed]

FINDINGS: No superficial solid mass or cystic structure seen in the lateral
aspect of the distal right thigh where patient describes a palpable
abnormality. Visualized small vessels are compressible.
IMPRESSION: No superficial solid mass or cystic structure detected within the
lateral aspect of the distal right thigh where patient describes a
palpable abnormality.

## 2017-07-13 DIAGNOSIS — S52592A Other fractures of lower end of left radius, initial encounter for closed fracture: Secondary | ICD-10-CM | POA: Diagnosis not present

## 2017-07-13 DIAGNOSIS — S62337A Displaced fracture of neck of fifth metacarpal bone, left hand, initial encounter for closed fracture: Secondary | ICD-10-CM | POA: Diagnosis not present

## 2017-07-17 ENCOUNTER — Ambulatory Visit (INDEPENDENT_AMBULATORY_CARE_PROVIDER_SITE_OTHER): Payer: Medicare Other | Admitting: *Deleted

## 2017-07-17 DIAGNOSIS — I639 Cerebral infarction, unspecified: Secondary | ICD-10-CM | POA: Diagnosis not present

## 2017-07-21 NOTE — Progress Notes (Signed)
Carelink Summary Report / Loop Recorder 

## 2017-07-27 ENCOUNTER — Encounter: Payer: Self-pay | Admitting: Neurology

## 2017-07-27 DIAGNOSIS — Z5181 Encounter for therapeutic drug level monitoring: Secondary | ICD-10-CM | POA: Diagnosis not present

## 2017-07-27 DIAGNOSIS — E538 Deficiency of other specified B group vitamins: Secondary | ICD-10-CM | POA: Diagnosis not present

## 2017-07-27 DIAGNOSIS — I10 Essential (primary) hypertension: Secondary | ICD-10-CM | POA: Diagnosis not present

## 2017-07-27 DIAGNOSIS — F988 Other specified behavioral and emotional disorders with onset usually occurring in childhood and adolescence: Secondary | ICD-10-CM | POA: Diagnosis not present

## 2017-07-27 DIAGNOSIS — E559 Vitamin D deficiency, unspecified: Secondary | ICD-10-CM | POA: Diagnosis not present

## 2017-07-28 LAB — CUP PACEART REMOTE DEVICE CHECK
MDC IDC PG IMPLANT DT: 20171206
MDC IDC SESS DTM: 20180803164013

## 2017-07-29 ENCOUNTER — Encounter: Payer: Self-pay | Admitting: Neurology

## 2017-07-29 ENCOUNTER — Ambulatory Visit (INDEPENDENT_AMBULATORY_CARE_PROVIDER_SITE_OTHER): Payer: Medicare Other | Admitting: Neurology

## 2017-07-29 VITALS — BP 135/77 | HR 63 | Ht 67.0 in | Wt 224.0 lb

## 2017-07-29 DIAGNOSIS — G4733 Obstructive sleep apnea (adult) (pediatric): Secondary | ICD-10-CM | POA: Diagnosis not present

## 2017-07-29 DIAGNOSIS — Z9989 Dependence on other enabling machines and devices: Secondary | ICD-10-CM | POA: Diagnosis not present

## 2017-07-29 DIAGNOSIS — G2581 Restless legs syndrome: Secondary | ICD-10-CM | POA: Diagnosis not present

## 2017-07-29 DIAGNOSIS — I638 Other cerebral infarction: Secondary | ICD-10-CM

## 2017-07-29 MED ORDER — VITAMIN D (ERGOCALCIFEROL) 1.25 MG (50000 UNIT) PO CAPS: 50000.0000 [IU] | ORAL_CAPSULE | ORAL | 3 refills | Status: DC

## 2017-07-29 MED ORDER — VITAMIN D (ERGOCALCIFEROL) 1.25 MG (50000 UNIT) PO CAPS: 50000.0000 [IU] | ORAL_CAPSULE | ORAL | 4 refills | Status: DC

## 2017-07-29 NOTE — Addendum Note (Signed)
Addended by: Larey Seat on: 07/29/2017 09:40 AM   Modules accepted: Orders

## 2017-07-29 NOTE — Progress Notes (Signed)
[facility]   Provider:  Larey Seat, M D  Referring Provider: [name], MD Primary Care Physician:    Chief Complaint  Patient presents with  . Follow-up    pt was gone on vacation last week and didnt use and there was one wk she didnt use. pt states normally she uses it all the time and that she also needs new equipment. pt uses [facility] and states that its hard to get supplies from them.     HPI:  [name] is a 71 y.o. female  Is seen here as a revisit for snoring and restless legs.  [name] just celebrated her 71th birthday, she has been followed in our clinic for well over 12 years, she has been so compliant CPAP user for the months of July. She reports that she did not get new supplies when I ordered them last December as she at the same time changed durable medical equipment companies. Unfortunately this will look like noncompliance. On days that she used CPAP she uses it for 6 hours and she has been furnished with a autotitrator between 7 and 15 cm water and 3 cm EPR. Her residual AHI is 5.1 per hour she does have some significant air leaks. I think this adds another dimension to the higher residual apnea count. The 95th percentile pressure is 13 cm water. There are no central apneas emerging I would like to increase her AutoSet setting to 16 cm water, and I will ask her new durable medical equipment company to furnish her with a new interface, headgear filter and tubing.      I have followed [name] from 2003 through 2008  And see her now for CPAP compliance.  The patient had undergone gastric bypass surgery, which led to an extensive loss of body weight. In the process she was no longer suffering from obstructive sleep apnea related to the weight loss, but she developed restless legs. Repeated iron studies had shown that she had extremely low ferritin levels. A ferritin level lower than 50 is associated with a much higher incidence of restless leg  syndrome in addition her iron and iron binding capacities have been surprisingly normal and this conflict of data we have discussed today. Usually I would treat the patient with Ferrlecit and infusion once every 3 months or so to keep up ferritin levels in in her case I am not sure how much free iron is actually in her system. 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 In addition she is vitamin D deficient which are almost 80% of adult Americans. Gained some weight back but she has no longer nausea or dumping syndrome so therefore she does not need Phenergan or Zofran. She is using gabapentin and ropinirole to treat her restless legs she often wakes up with a headache and she suffers from at times severe crippling migraines. Leg cramps also interfere with her sleep the initiation of sleep as well as the sleep duration at night.  As her BMI exceeds 35, she may also have developed apnea again and snoring has been witnessed by her family. We are meeting today to reevaluate all these conditions in context. She is also excessively daytime sleepy for fatigue severity score is endorsed at 63 points her Epworth sleepiness score was endorsed at 15 points. Bedtime is usually 10 Pm and her husband leaves for work at 6.30 AM , after that she will sleep for 2-3  hours. Total daily sleep time is 14 hours, this is more likely related to depression.   Interval history from 08-16-15 [name] reports that the iron infusion that she received under [name] care helped her greatly for while . By July 2016 she was supposed to follow-up with him but he has meanwhile started her teaching job at the hospital and was not able to follow up. So she is here again because her restless legs are acting up again. She has always responded to iron infusions and as she is status post gastric bypass surgery she does not have a normal absorption for by mouth iron. Her ferritin levels have been  clinically and critically low. She underwent a repeat sleep study on 01-10-15 after endorsing the Epworth Sleepiness Scale at 19 points.  Her AHI was 30.3 her RDI was 38.6 supine AHI was 44.5. Was titrated to 10 cm water pressure the titration was not optimal. She was therefore prescribed an hour to titrate her. Now her residual AHI is 2.8 and she remains on an O2 sat at 7 and 15 cm water with full-time EPR of 3 cm. I'm able to obtain a download here in office and reviewed with the patient she has been 100% compliance for days of use, 87% compliance for over 4 hours of consecutive use and average user time is 6 hours 49 minutes. 95th percentile pressure is 11.5 cm water. Her restless leg syndrome quality of life questionnaire was endorsed at moderate to severe impairment and the RLS 6 rating scale was endorsed at severe impairment. We are going to infuse her once again with iron. She is on Requip 2 mg total. I will see if I can enroll her into our restless leg study. The patient just had a follow-up appointment with [name] her primary care physician,   Today is 12-05-15. #1 She is doing moderately well with her CPAP use was a compliance of 73% average user time on days of use is 4 hours and 44 minutes. Minimum pressure for this AutoSet 7 maximum 15 cm water. The patient's AHI was 2.5 which is a desirable result. The 95th percentile pressure is 12.3. The patient could not use the machine for about 2 weeks after she contracted an upper respiratory infection doing a vacation stay in [place]. She is now back to using it regularly.  #2 her restless leg question here of was endorsed at moderate impairment quality of life.  It seems that her iron levels have rebounded to some degree , but Ferritin results are pending. Her geriatric depression score does not indicate depression her Epworth sleepiness score was endorsed at 8 points and her fatigue severity score 26 points.  #3 headache are a concern. She hates  the pain clinic. We are not providing pain management in this clinic but I do feel that the patient could change with the same narcotic agreement and contract to [name]  Her main care provider. She has no history of overusing up using or accidentally overdosing any medications. Her depression scores are in normal range not indicated clinical depression at all. The patient is aware that she could receive with a headache attack Depakote IV. However I am not thrilled with the opportunity of preventing migrainous headaches by Depakote, given the risk of weight gain, liver failure tremor. She has been on a beta blocker which has helped to some degree. She would be interested in some kind of injection therapy but I explained that Botox injections in  the Medicare population on very difficult to get paid for. And Botox is an extremely costly medication these days. The patient has some basic medical training and she would be willing to give herself a shot. We discussed TORADOL  which can be taken either as a 10 mg 2 pill or as an IM medication.  #4 patient will receive 1 mL equaling 2,000,000 g of Toradol intramuscular injection today for her acute headache. If this works well for her it could be an alternative treatment and hopefully will relieve her of the need to take narcotics to. The patient is a status post gastric bypass surgery so she cannot have nonsteroidal by mouth .  Interval history from 04/09/2016. [name] is seen here today after she has twice been seen by my colleague Dr. [name]. I had sent her to her to evaluate her headaches and possible dystonia and neuralgic character and she was referred for Dr. Kittie Plater for possible Botox injections. Within the workup for headaches and MRI of the brain was obtained. This showed 2 small wedge shaped foci in the left hemisphere consistent with prior small ischemic strokes. She was started on aspirin baby size and Zetia. There was chronic microvascular ischemic  change as expected for age, some cortical atrophy no acute findings. At the time that she saw Dr. [name] she had neck pain for 7-8 months already. Voltaren gel helps a little bit pain on movement. She has migraines without aura that started already when she was a teenager. Tried topiramate in the past has failed amitriptyline, try Toradol, tried Imitrex , Cymbalta.  She has weaned off all pain medications.  CT of the soft tissue of the neck was also reviewed ; there is evidence of cervical disc and facet degeneration. An echocardiogram was ordered revealing a patent foramen ovale or she is scheduled for an occipital nerve block with Dr. Dr. Kittie Plater, he will do a second one next coming Friday. This has helped her neck pain. She has not seen her cardiologist.  [name] presents today on 10/23/2016. She has recently seen Dr. [name] again who has been successfully treating her head and neck pain. 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 The pain has improved. As to her CPAP use is 87% compliant with an average user time of 6 hours and 9 minutes, she is using an AutoSet between 7 and 15 cm water. Her 95th percentile pressure is 14.4 and straddles close to the maximum pressure allowed. A residual AHI is 7.2 and the residual apneas are obstructive in nature. I do think we should increase the pressure window by 2 cm water. She endorsed today the Epworth sleepiness score at only 4 points.  Her RLS is controlled on 2 nightly doses of medication, each dose giving relief for about 4 hours. She takes quinine for leg cramps. She has  No arryhthmia, has a loop recorder .  Recent mamogram was normal but her cardiologist discovered a mass under the 4 th rib. She is awaiting a CT chest now.     Review of Systems: Out of a complete 14 system review, the patient complains of only the following symptoms, and all other reviewed systems are negative. Restless legs she often wakes up with a  headache and she suffers from at times severe crippling migraines.  Leg cramps also interfere with her sleep the initiation of sleep as well as the sleep duration at night.  Her daughter K. Strickland and her mother have  been extremely sleepy. Family history  She broke her wits 2 onth ago- June 2018. She continues to  loose weight.  Epworth score 5 , Fatigue severity score 21 from 34 ,  depression score 3. She started exercising after knee surgery.  She lost a lot of weight, she is exercising and she weaned aff all narcotics. She is happy, less depressed.     Social History   Social History  . Marital status: Married    Spouse name: [name]  . Number of children: 3  . Years of education: college   Occupational History  . Retired    Social History Main Topics  . Smoking status: Never Smoker  . Smokeless tobacco: Never Used  . Alcohol use No  . Drug use: No  . Sexual activity: Yes    Birth control/ protection: Post-menopausal   Other Topics Concern  . Not on file   Social History Narrative   Lives at home w/ her husband   Caffeine 8-10 cups daily.  (coffee 1 cup am, unsw tea all day long).     Family History  Problem Relation Age of Onset  . CAD Mother   . Hypertension Mother   . Neuropathy Mother   . Osteoarthritis Mother   . Heart disease Mother   . Breast cancer Maternal Grandmother   . CAD Paternal Grandfather   . Colon cancer Father     Past Medical History:  Diagnosis Date  . 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 Arthritis    generalized  . Asthma   . Atrophic vaginitis   . Back pain    DDD/stenosis  . Carotid stenosis    Carotid US 10/16: Plaque RICA (9-32%), normal LICA  . Depression    takes Cymbalta daily  . Diverticulosis    benign  . DJD (degenerative joint disease)   . Dyslipidemia   . Dysrhythmia   . Family history of GI bleeding   . GERD (gastroesophageal reflux disease)    takes Nexium daily  . Gestational  diabetes   . H/O hiatal hernia    surgery for hernia  . Headache(784.0)    takes Imitrex daily as needed and Bisoprolol daily;last migraine was about 2wks ago  . History of bronchitis 1 yr ago  . History of shingles   . Insomnia    takes Ambien nightly  . Joint pain   . Joint swelling   . Leg cramps    takes Flexeril daily as needed  . 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 Nocturia   . Osteoporosis   . Peripheral neuropathy    takes Gabapentin daily  . Pneumonia 24yrs ago   hx of  . Restless leg syndrome    takes Requip daily  . RLS (restless legs syndrome) 08/16/2015  . Sleep apnea    uses CPAP  . Tubular adenoma of colon   . Vertigo   . Vitamin D deficiency     Current Outpatient Prescriptions  Medication Sig Dispense Refill  . amphetamine-dextroamphetamine (ADDERALL) 10 MG tablet Take 10 mg by mouth daily with breakfast.   0  . baclofen (LIORESAL) 10 MG tablet Take 10 mg by mouth 2 (two) times daily.    . bisoprolol (ZEBETA) 10 MG tablet Take 10 mg by mouth 2 (two) times daily.    . calcium carbonate (OS-CAL) 1250 (500 CA) MG chewable tablet Chew 1 tablet by mouth daily. Not sure of dosage    .  Diclofenac Potassium (CAMBIA) 50 MG PACK Take 50 mg by mouth at bedtime as needed. 1 each 11  . DULoxetine (CYMBALTA) 60 MG capsule Take 60 mg by mouth 2 (two) times daily.  11  . esomeprazole (NEXIUM) 40 MG capsule Take 40 mg by mouth 2 (two) times daily before a meal.   6  . Evolocumab (REPATHA SURECLICK) 517 MG/ML SOAJ Inject 1 Dose into the skin every 14 (fourteen) days. Every other week     . flurbiprofen (ANSAID) 100 MG tablet TAKE 1 TABLET(100 MG) BY MOUTH THREE TIMES DAILY AS NEEDED 60 tablet 3  . furosemide (LASIX) 20 MG tablet Take 40 mg by mouth daily.    . Loratadine (CLARITIN) 10 MG CAPS Take 1 capsule by mouth as needed (for allergies).     Marland Kitchen LYRICA 100 MG capsule Take 1 capsule (100 mg total) by mouth 2 (two) times daily. 60 capsule 11  . ondansetron (ZOFRAN ODT) 4 MG  disintegrating tablet Take 1 tablet (4 mg total) by mouth every 8 (eight) hours as needed for nausea or vomiting. 45 tablet 11  . oxyCODONE-acetaminophen (PERCOCET) 10-325 MG tablet Take 1 tablet by mouth every 6 (six) hours as needed for pain. 60 tablet 0  . PROAIR HFA 108 (90 Base) MCG/ACT inhaler 2 INHALATIONS EVERY 4 HOURS AS NEEDED FOR ASTHMA SYMPTOMS (WHEEZING/SHORTNESS OF BREATH)  5  . quiNINE (QUALAQUIN) 324 MG capsule Take 1 capsule (324 mg total) by mouth 2 (two) times daily. 180 capsule 3  . rOPINIRole (REQUIP) 1 MG tablet Take 1 tablet (1 mg total) by mouth 2 (two) times daily before lunch and supper. 180 tablet 3  . spironolactone (ALDACTONE) 25 MG tablet Take 1 tablet (25 mg total) by mouth 2 (two) times daily. 60 tablet 11  . topiramate (TOPAMAX) 100 MG tablet Take 2 tablets (200 mg total) by mouth at bedtime. 60 tablet 11  . Vitamin D, Ergocalciferol, (DRISDOL) 50000 units CAPS capsule Take 1 capsule (50,000 Units total) by mouth every 7 (seven) days. 12 capsule 4   No current facility-administered medications for this visit.     Allergies as of 07/29/2017  . (No Known Allergies)    Vitals: BP 135/77   Pulse 63   Ht 5\' 7"  (1.702 m)   Wt 224 lb (101.6 kg)   BMI 35.08 kg/m  Last Weight:  Wt Readings from Last 1 Encounters:  07/29/17 224 lb (101.6 kg)       Last Height:   Ht Readings from Last 1 Encounters:  07/29/17 5\' 7"  (1.702 m)    Physical exam:  General: The patient is awake, alert and appears not in acute distress. The patient is well groomed. Head: Normocephalic, atraumatic. Neck is now supple. No tenderness. Mallampati 3 ,  neck circumference: 15 inches. Nasal airflow unrestricted, TMJ is not evident. Retrognathia is seen.  Cardiovascular:  Regular rate and rhythm, without  murmurs or carotid bruit, and without distended neck veins. Respiratory: Lungs are clear to auscultation. Skin:  Without evidence of edema, or rash, well healed knee replacement scars.    Trunk: BMI is elevated .  Neurologic exam :The patient is awake and alert, oriented to place and time.  Memory subjective described as intact.  There is a normal attention span & concentration ability. Speech is fluent without  dysarthria, dysphonia or aphasia. Mood and affect are appropriate. Cranial nerves: intact smell and taste. Dry mouth.  Pupils are equal and briskly reactive to light.  Facial motor strength  is symmetric and tongue and uvula move midline. Shoulder shrug intact . Motor exam:   Normal tone, muscle bulk and symmetric strength in all extremities. No cog wheeling and no waxy or clasp knife response.  Sensory:  Deferred-. Proprioception in both feet remains  affeced by pin and needle dysesthesias.  Deep tendon reflexes: in the  upper and lower extremities are symmetric and intact. Babinski maneuver downgoing.   Assessment:  After physical and neurologic examination, review of laboratory studies, imaging, neurophysiology testing and pre-existing records, assessment is    Of today's new patient visit-consultation 50% of our face-to-face time are spent in discussion of the medical conditions found the differential diagnosis the testing and treatment options for the conditions. Plan:  Treatment plan and additional workup :  1 RLS with iron deficiency ) Dr. Beryle Beams  evaluated her discrepancy between very low ferritin levels  and apparently sufficient iron storage. She has maintained oral iron intake as well as multivitamins and multi minerals. She feels better and she exercises. Has energy.   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 She is not diabetic she has no documented thyroid disease and neither did her mother nor has her daughter but all of these women half severe painful dysesthesias leg cramps and restless legs. Malabsorption or trace element deficiency ? She took quinine with good success, but had to order from [place].   3) OSA- continue on CPAP, auto PAP . Doing well !  The patient's auto CPAP is set between 7 and 16 cm water with 3 cm EPR . Residual AHI is 3.6., 95st percentile pressure is 13.9.   Rv in 6 month. With me.   She needs new supplies.  No labs today.  DME to be informed.   I thank Dr [name] for her headache expertise and treatment.  The patient has been relieved of neck pain and occipital neuralgic pain and believes she has dystonia. Has pain with radiculopathic distribution to the shoulders.    Asencion Partridge Vikkie Goeden MD  07/29/2017

## 2017-07-31 DIAGNOSIS — E559 Vitamin D deficiency, unspecified: Secondary | ICD-10-CM | POA: Diagnosis not present

## 2017-07-31 DIAGNOSIS — I639 Cerebral infarction, unspecified: Secondary | ICD-10-CM | POA: Diagnosis not present

## 2017-07-31 DIAGNOSIS — E538 Deficiency of other specified B group vitamins: Secondary | ICD-10-CM | POA: Diagnosis not present

## 2017-07-31 DIAGNOSIS — I10 Essential (primary) hypertension: Secondary | ICD-10-CM | POA: Diagnosis not present

## 2017-08-11 ENCOUNTER — Ambulatory Visit (INDEPENDENT_AMBULATORY_CARE_PROVIDER_SITE_OTHER): Payer: Medicare Other | Admitting: Neurology

## 2017-08-11 VITALS — BP 136/73 | HR 65

## 2017-08-11 DIAGNOSIS — G243 Spasmodic torticollis: Secondary | ICD-10-CM

## 2017-08-11 NOTE — Progress Notes (Addendum)
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 She has significant pain, decreased ROM which is chronic for over one year. She gets regular massage. We have performed multiple trigger point injections with Depo-Medrol, lidocaine and Marcaine. She has tried baclofen, Cymbalta, oxycodone, Lyrica, gabapentin, Robaxin without any relief. Patient would benefit from Physical therapy for stretching, strengthening, manual therapy and massage as well as dry needling of the right trapezius muscle and any other muscles o modalities as clinically warranted by physical therapy evaluation   Procedure note for Spasmodic torticollis:  EMG: EMG guidance was used to inject muscles detailed below. Aseptic procedure was performed and patient tolerated procedure. Procedure was performed by Dr. Myrla Halsted   Patient tolerated the procedure.   Refractory to oral medications   Motrin / tylenol for injections site pain / soreness   REMS precautions handout given to patient   RTC - see instructions for details   Wasted 295 Units.   Dysport-1000 units [id number]   Dysport-500unitsx2vial Lot: [id number] Expiration: 03/14/18 NDC: [address]-6 [id number]  0.9% Sodium Chloride bacteriostatic- 16mL total Lot: [id number] Expiration: 05/15/18 NDC: 1941-7408-14   DX: G24.3 Buy and Bill  Muscles injected:Muscles injected:  50 each right and left levator scapula(100 total).  300right and 200 left trapezius, 55right semispinalis capitus, 50 right splenius capitus

## 2017-08-12 ENCOUNTER — Other Ambulatory Visit: Payer: Self-pay | Admitting: Neurology

## 2017-08-12 ENCOUNTER — Ambulatory Visit: Payer: Medicare Other | Admitting: Neurology

## 2017-08-12 MED ORDER — OXYCODONE-ACETAMINOPHEN 10-325 MG PO TABS: 1.0000 | ORAL_TABLET | Freq: Four times a day (QID) | ORAL | 0 refills | Status: DC | PRN

## 2017-08-14 ENCOUNTER — Telehealth: Payer: Self-pay | Admitting: *Deleted

## 2017-08-14 NOTE — Telephone Encounter (Addendum)
Error

## 2017-08-18 ENCOUNTER — Ambulatory Visit (INDEPENDENT_AMBULATORY_CARE_PROVIDER_SITE_OTHER): Payer: Medicare Other | Admitting: *Deleted

## 2017-08-18 DIAGNOSIS — I639 Cerebral infarction, unspecified: Secondary | ICD-10-CM | POA: Diagnosis not present

## 2017-08-19 NOTE — Progress Notes (Signed)
Carelink Summary Report / Loop Recorder 

## 2017-08-23 LAB — CUP PACEART REMOTE DEVICE CHECK
Implantable Pulse Generator Implant Date: 20171206
MDC IDC SESS DTM: 20180902174051

## 2017-08-25 ENCOUNTER — Other Ambulatory Visit: Payer: Self-pay | Admitting: Neurology

## 2017-08-25 DIAGNOSIS — G243 Spasmodic torticollis: Secondary | ICD-10-CM

## 2017-09-14 ENCOUNTER — Ambulatory Visit (INDEPENDENT_AMBULATORY_CARE_PROVIDER_SITE_OTHER): Payer: Medicare Other | Admitting: Podiatry

## 2017-09-14 ENCOUNTER — Encounter: Payer: Self-pay | Admitting: Podiatry

## 2017-09-14 ENCOUNTER — Ambulatory Visit (INDEPENDENT_AMBULATORY_CARE_PROVIDER_SITE_OTHER): Payer: Medicare Other

## 2017-09-14 VITALS — BP 120/72 | HR 65 | Resp 16 | Ht 66.0 in | Wt 218.0 lb

## 2017-09-14 DIAGNOSIS — M779 Enthesopathy, unspecified: Secondary | ICD-10-CM | POA: Diagnosis not present

## 2017-09-14 DIAGNOSIS — M722 Plantar fascial fibromatosis: Secondary | ICD-10-CM

## 2017-09-14 DIAGNOSIS — I6389 Other cerebral infarction: Secondary | ICD-10-CM

## 2017-09-14 MED ORDER — TRIAMCINOLONE ACETONIDE 10 MG/ML IJ SUSP
10.0000 mg | Freq: Once | INTRAMUSCULAR | Status: AC
  Administered 2017-09-14: 10 mg

## 2017-09-14 NOTE — Progress Notes (Signed)
   Subjective:    Patient ID: [name], female    DOB: 08/18/1946, 71 y.o.   MRN: [id number]  HPI    Review of Systems  All other systems reviewed and are negative.      Objective:   Physical Exam        Assessment & Plan:

## 2017-09-14 NOTE — Patient Instructions (Signed)

## 2017-09-15 ENCOUNTER — Ambulatory Visit (INDEPENDENT_AMBULATORY_CARE_PROVIDER_SITE_OTHER): Payer: Medicare Other | Admitting: *Deleted

## 2017-09-15 DIAGNOSIS — I639 Cerebral infarction, unspecified: Secondary | ICD-10-CM | POA: Diagnosis not present

## 2017-09-16 NOTE — Progress Notes (Signed)
Subjective:    Patient ID: [name], female   DOB: 71 y.o.   MRN: [id number]   HPI patient states she's had a lot of pain in the bottom of the right heel and is had discomfort on the dorsum of both feet that's more chronic in nature. States the heels been present around 2 months and getting worse and patient does not smoke and likes to be active    Review of Systems  All other systems reviewed and are negative.       Objective:  Physical Exam  Constitutional: She appears well-developed and well-nourished.  Cardiovascular: Intact distal pulses.   Pulmonary/Chest: Effort normal.  Musculoskeletal: Normal range of motion.  Neurological: She is alert.  Skin: Skin is warm.  Nursing note and vitals reviewed.  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

## 2017-09-16 NOTE — Progress Notes (Signed)
Carelink Summary Report / Loop Recorder 

## 2017-09-17 LAB — CUP PACEART REMOTE DEVICE CHECK
Date Time Interrogation Session: 20181002181030
MDC IDC PG IMPLANT DT: 20171206

## 2017-09-18 ENCOUNTER — Telehealth: Payer: Self-pay | Admitting: *Deleted

## 2017-09-23 ENCOUNTER — Encounter: Payer: Self-pay | Admitting: Podiatry

## 2017-09-23 ENCOUNTER — Ambulatory Visit (INDEPENDENT_AMBULATORY_CARE_PROVIDER_SITE_OTHER): Payer: Medicare Other | Admitting: Podiatry

## 2017-09-23 ENCOUNTER — Other Ambulatory Visit: Payer: Self-pay | Admitting: Podiatry

## 2017-09-23 ENCOUNTER — Ambulatory Visit (INDEPENDENT_AMBULATORY_CARE_PROVIDER_SITE_OTHER): Payer: Medicare Other

## 2017-09-23 DIAGNOSIS — M79672 Pain in left foot: Secondary | ICD-10-CM | POA: Diagnosis not present

## 2017-09-23 DIAGNOSIS — M722 Plantar fascial fibromatosis: Secondary | ICD-10-CM | POA: Diagnosis not present

## 2017-09-23 DIAGNOSIS — M779 Enthesopathy, unspecified: Secondary | ICD-10-CM

## 2017-09-23 MED ORDER — TRIAMCINOLONE ACETONIDE 10 MG/ML IJ SUSP
10.0000 mg | Freq: Once | INTRAMUSCULAR | Status: AC
  Administered 2017-09-23: 10 mg

## 2017-09-24 MED ORDER — ERENUMAB-AOOE 70 MG/ML ~~LOC~~ SOAJ: 70.0000 mg | SUBCUTANEOUS | 11 refills | Status: DC

## 2017-09-24 NOTE — Telephone Encounter (Signed)
Called patient to discuss Aimovig. She stated she had received two pens in the mail, had used one and brought the second to appt with Dr [name]. She stated she used it that day, 08/11/17. She stated she doesn't have any more. This RN advised her the Aimovig rep stated there is a backlog of Aimovig due to high demand. This office was instructed to send Rx to her retail pharmacy. The pharmacist is to call a hotline to get access card for her. This RN advised unsure what her cost will be, but this is what rep advised be done. Patient verbalized understanding, agreement. She then asked when she is to see Dr [name] again, stated she is seen every 3 months. Seen for Botox for migraine then seen for injections for cervical dystonia. This RN advised there are no upcoming appts scheduled with Dr [name]. Patient requested to be called back by RN and be scheduled. Will roteu to RN and Dr [name].

## 2017-09-24 NOTE — Progress Notes (Signed)
Subjective:    Patient ID: [name], female   DOB: 71 y.o.   MRN: [id number]   HPI patient presents stating I'm improved but I'm still having quite a bit of pain in the outside of the right foot and the heel plantar right. States that it's been gradually increasing but it has improved when you treated it and that the braces help quite a bit    ROS      Objective:  Physical Exam neurovascular status with inflammation and pain mostly in the plantar heel left and in the lateral side of the right foot with fluid buildup noted     Assessment:   Plantar fasciitis that's occurring with tendinitis-like condition      Plan:    H&P and today I injected the tendon complex bilateral 3 mg Kenalog 5 mg Xylocaine advised on physical therapy anti-inflammatories and discussed possibility for long-term orthotic to try to control symptoms. Reappoint to recheck

## 2017-09-29 NOTE — Telephone Encounter (Signed)
I called to schedule patient for her injections but she did not answer, left a VM asking her to call us back.

## 2017-09-30 ENCOUNTER — Telehealth: Payer: Self-pay

## 2017-09-30 NOTE — Telephone Encounter (Signed)
I received a prior authorization request for the Aimovig. I have completed and submitted the PA and we should have a determination within 48-72 hours.

## 2017-10-09 NOTE — Telephone Encounter (Signed)
[employer] w/ [employer] called and informed me that the patient has [facility] and therefore they have updated the form on the website. Another PA will need to be done.

## 2017-10-15 ENCOUNTER — Ambulatory Visit (INDEPENDENT_AMBULATORY_CARE_PROVIDER_SITE_OTHER): Payer: Medicare Other | Admitting: *Deleted

## 2017-10-15 DIAGNOSIS — I639 Cerebral infarction, unspecified: Secondary | ICD-10-CM

## 2017-10-15 NOTE — Telephone Encounter (Signed)
I resubmitted the PA for Aimovig and should have a determination within 48-72 hours.

## 2017-10-16 NOTE — Telephone Encounter (Signed)
I received denial but I have submitted the appeal with documentation via CoverMyMeds and faxed OV notes to [phone].   Key: [id number]

## 2017-10-16 NOTE — Progress Notes (Signed)
Carelink Summary Report / Loop Recorder 

## 2017-10-19 LAB — CUP PACEART REMOTE DEVICE CHECK
Date Time Interrogation Session: 20181101184105
MDC IDC PG IMPLANT DT: 20171206

## 2017-10-19 NOTE — Telephone Encounter (Signed)
She cannot have beta blockers due to asthma. Will you call patient and se eif she has tried depakote? I bet she has in the past, thanks

## 2017-10-19 NOTE — Telephone Encounter (Signed)
I called and spoke with [name] at [facility] and made her aware that patient has tried and failed depakote and that she cannot take beta blockers. [name] stated that was all she needed to approve the aimovig. We should be receiving the letter shortly.

## 2017-10-19 NOTE — Telephone Encounter (Signed)
Patient states that she has been on Depakote in the past and it did not help. She also stated that she needs to schedule her next botox injections. Danielle, could you please call her to set this up? Thanks!

## 2017-10-19 NOTE — Telephone Encounter (Signed)
Dr. [name], I do not see that she has tried these particular medications - depakote, propranolol, or timolol - is these something that you would agree she try or do you feel that they would be less effective or may cause adverse effects?

## 2017-10-19 NOTE — Telephone Encounter (Signed)
Shawanne/[employer] [phone] called wanting to know if the pt has tried depakote, propranolol and timolol. Does the provider feel that they would less effective or may cause adverse effects.  This is all she needs to approve it

## 2017-10-21 NOTE — Telephone Encounter (Addendum)
Received a letter from [facility] stating that Aimovig 70 mg/mL injection 1 pen every 30 days has been approved from 09/30/2017 - 09/30/2018. Event # [id number]. Called and LVM (ok per DPR) and informed patient of the approval.

## 2017-10-22 ENCOUNTER — Encounter: Payer: Self-pay | Admitting: Podiatry

## 2017-10-22 ENCOUNTER — Ambulatory Visit (INDEPENDENT_AMBULATORY_CARE_PROVIDER_SITE_OTHER): Payer: Medicare Other | Admitting: Podiatry

## 2017-10-22 DIAGNOSIS — M722 Plantar fascial fibromatosis: Secondary | ICD-10-CM

## 2017-10-22 DIAGNOSIS — M79673 Pain in unspecified foot: Secondary | ICD-10-CM

## 2017-10-22 MED ORDER — TRIAMCINOLONE ACETONIDE 10 MG/ML IJ SUSP
10.0000 mg | Freq: Once | INTRAMUSCULAR | Status: AC
  Administered 2017-10-22: 10 mg

## 2017-10-22 NOTE — Progress Notes (Signed)
Subjective:    Patient ID: [name], female   DOB: 71 y.o.   MRN: [id number]   HPI patient states the heel has started to bother her quite a bit again and she's had a long-term history of this problem on and off. Patient also has mild discomfort outside of the foot where she's walking differently    ROS      Objective:  Physical Exam neurovascular status intact with exquisite discomfort plantar aspect right heel at the insertional point tendon the calcaneus with inflammation noted and moderate change in gait with tendinitis-like symptomatology     Assessment:  Acute tendinitis that has reoccurred right with inflammation fluid around the medial band and lateral tendinitis secondary to compensation     Plan:    H&P condition reviewed and today injected the right plantar fascia 3 mg Kenalog 5 mill grams Xylocaine and went ahead and scanned for customized orthotic devices to reduce stress against the foot. Patient unfortunately lost her fascial brace and needs this and had a new one dispensed today

## 2017-10-29 DIAGNOSIS — I10 Essential (primary) hypertension: Secondary | ICD-10-CM | POA: Diagnosis not present

## 2017-10-29 DIAGNOSIS — I639 Cerebral infarction, unspecified: Secondary | ICD-10-CM | POA: Diagnosis not present

## 2017-10-29 DIAGNOSIS — E559 Vitamin D deficiency, unspecified: Secondary | ICD-10-CM | POA: Diagnosis not present

## 2017-10-29 DIAGNOSIS — E538 Deficiency of other specified B group vitamins: Secondary | ICD-10-CM | POA: Diagnosis not present

## 2017-10-29 DIAGNOSIS — D509 Iron deficiency anemia, unspecified: Secondary | ICD-10-CM | POA: Diagnosis not present

## 2017-11-02 DIAGNOSIS — F988 Other specified behavioral and emotional disorders with onset usually occurring in childhood and adolescence: Secondary | ICD-10-CM | POA: Diagnosis not present

## 2017-11-02 DIAGNOSIS — I10 Essential (primary) hypertension: Secondary | ICD-10-CM | POA: Diagnosis not present

## 2017-11-02 DIAGNOSIS — E7800 Pure hypercholesterolemia, unspecified: Secondary | ICD-10-CM | POA: Diagnosis not present

## 2017-11-02 DIAGNOSIS — E538 Deficiency of other specified B group vitamins: Secondary | ICD-10-CM | POA: Diagnosis not present

## 2017-11-02 DIAGNOSIS — Z23 Encounter for immunization: Secondary | ICD-10-CM | POA: Diagnosis not present

## 2017-11-02 DIAGNOSIS — G43909 Migraine, unspecified, not intractable, without status migrainosus: Secondary | ICD-10-CM | POA: Diagnosis not present

## 2017-11-02 DIAGNOSIS — G2581 Restless legs syndrome: Secondary | ICD-10-CM | POA: Diagnosis not present

## 2017-11-02 DIAGNOSIS — D509 Iron deficiency anemia, unspecified: Secondary | ICD-10-CM | POA: Diagnosis not present

## 2017-11-04 ENCOUNTER — Encounter: Payer: Self-pay | Admitting: Neurology

## 2017-11-04 ENCOUNTER — Telehealth: Payer: Self-pay | Admitting: Neurology

## 2017-11-04 ENCOUNTER — Ambulatory Visit (INDEPENDENT_AMBULATORY_CARE_PROVIDER_SITE_OTHER): Payer: Medicare Other | Admitting: Neurology

## 2017-11-04 VITALS — BP 143/80 | HR 62

## 2017-11-04 DIAGNOSIS — G43711 Chronic migraine without aura, intractable, with status migrainosus: Secondary | ICD-10-CM | POA: Diagnosis not present

## 2017-11-04 MED ORDER — ERENUMAB-AOOE 70 MG/ML ~~LOC~~ SOAJ: 70.0000 mg | SUBCUTANEOUS | 11 refills | Status: DC

## 2017-11-04 MED ORDER — ERENUMAB-AOOE 70 MG/ML ~~LOC~~ SOAJ: 140.0000 mg | SUBCUTANEOUS | 11 refills | Status: DC

## 2017-11-04 NOTE — Telephone Encounter (Signed)
Pt. Needs botox and infusion.

## 2017-11-04 NOTE — Progress Notes (Addendum)
Botox 200 units x 2 vials LOT: [id number] EXP: 01/2018 NDC: 9747-1855-01  & LOT: [id number] EXP: 02/2020 NDC: 4935-5217-47 B&B G43.711  Bacteriostatic 0.9% Sodium Chloride 4 mL LOT: [id number] EXP: 04/15/2019 NDC: 6728-9791-50 //BCrn  CHARGE FOR 100 units only, used 100 unit sample

## 2017-11-04 NOTE — Progress Notes (Signed)

## 2017-11-09 NOTE — Telephone Encounter (Signed)
Patient already set up for dysport injection in January 2019. Message sent to [facility].

## 2017-11-09 NOTE — Telephone Encounter (Signed)
I called to schedule patient for her next injection, she did not answer so I left a VM asking her to call me back.

## 2017-11-12 ENCOUNTER — Ambulatory Visit (INDEPENDENT_AMBULATORY_CARE_PROVIDER_SITE_OTHER): Payer: Medicare Other | Admitting: Orthotics

## 2017-11-12 DIAGNOSIS — M79672 Pain in left foot: Secondary | ICD-10-CM

## 2017-11-12 DIAGNOSIS — M722 Plantar fascial fibromatosis: Secondary | ICD-10-CM

## 2017-11-12 DIAGNOSIS — M779 Enthesopathy, unspecified: Secondary | ICD-10-CM

## 2017-11-12 NOTE — Progress Notes (Signed)
Patient came in today to pick up custom made foot orthotics.  The goals were accomplished and the patient reported no dissatisfaction with said orthotics.  Patient was advised of breakin period and how to report any issues. 

## 2017-11-16 ENCOUNTER — Other Ambulatory Visit: Payer: Self-pay | Admitting: Neurology

## 2017-11-16 ENCOUNTER — Ambulatory Visit (INDEPENDENT_AMBULATORY_CARE_PROVIDER_SITE_OTHER): Payer: Medicare Other | Admitting: *Deleted

## 2017-11-16 DIAGNOSIS — I639 Cerebral infarction, unspecified: Secondary | ICD-10-CM

## 2017-11-16 NOTE — Progress Notes (Signed)
Carelink Summary Report / Loop Recorder 

## 2017-11-24 LAB — CUP PACEART REMOTE DEVICE CHECK
Date Time Interrogation Session: 20181201191042
Implantable Pulse Generator Implant Date: 20171206

## 2017-11-25 ENCOUNTER — Encounter: Payer: Self-pay | Admitting: Podiatry

## 2017-11-25 ENCOUNTER — Ambulatory Visit (INDEPENDENT_AMBULATORY_CARE_PROVIDER_SITE_OTHER): Payer: Medicare Other | Admitting: Podiatry

## 2017-11-25 DIAGNOSIS — I6389 Other cerebral infarction: Secondary | ICD-10-CM

## 2017-11-25 DIAGNOSIS — M779 Enthesopathy, unspecified: Secondary | ICD-10-CM

## 2017-11-25 DIAGNOSIS — L600 Ingrowing nail: Secondary | ICD-10-CM | POA: Diagnosis not present

## 2017-11-25 MED ORDER — HYDROCODONE-ACETAMINOPHEN 10-325 MG PO TABS: 1.0000 | ORAL_TABLET | Freq: Three times a day (TID) | ORAL | 0 refills | Status: DC | PRN

## 2017-11-25 NOTE — Patient Instructions (Signed)

## 2017-11-25 NOTE — Progress Notes (Signed)
Subjective:   Patient ID: [name], female   DOB: 71 y.o.   MRN: [id number]   HPI Patient presents the heel and arch are feeling quite a bit better but she does have ingrown toenails of the hallux right over left both medial and lateral borders.  States that she is tried to trim them and soak them without relief of symptoms and pedicurist have not been able to help her.   ROS      Objective:  Physical Exam  Neurovascular status intact with patient found to have incurvated nailbeds hallux bilateral with distal irritation but no active drainage noted.  Patient's heel is doing much better upon palpation.     Assessment:  Ingrown toenail deformity hallux right over left with healing plantar fascial symptomatology right.     Plan:  H&P and discussed exercises for the heel and recommended correction of ingrown toenail with the first 1 to be done today.  Patient wants procedure and I allow patient to read procedure going over risks of surgery and she wants procedure done and I infiltrated the right hallux 60 mg Xylocaine Marcaine mixture removed the medial lateral borders exposed matrix and applied phenol 3 applications 30 seconds followed by alcohol lavage and sterile dressing.  Instructed on soaks and reappoint to recheck and to have correction of the left one done in a week

## 2017-11-30 ENCOUNTER — Telehealth: Payer: Self-pay | Admitting: Podiatry

## 2017-11-30 NOTE — Telephone Encounter (Signed)
I saw Dr. Paulla Dolly last Thursday for an ingrown. I think my toe might be infected, its really red, and it doesn't look right. If a nurse could please call me back at [phone]. Thank you. Bye bye.

## 2017-11-30 NOTE — Telephone Encounter (Signed)
I spoke to pt she states the toe is red all around the nail area and whitish. I asked the pt if the whitish area looked like when you leave a bandaid on after it has gotten wet and pt stated yes. I told pt the toe would be red, swollen, burny or stingy with drainage for 4-6 weeks for varying degrees. I told pt that she needed to continue the epsom salt soaks, pat dry then cover with a neosporin bandaid after each soak. Pt states she has not been covering with a neosporin bandaid but has been allowing to air dry. I told pt that she needed to perform the soaks twice daily, pat dry and cover with a neosporin bandaid for 4-6 weeks, that at the end of the 4th week perform the last soak of the day leave off the neosporin bandaid and allow to air dry, if the area got a dry hard scab without redness, swelling, drainage or pain then she could stop the soaks, but if continued symptoms she needed to continue the soaks for another 2 weeks then test again. I told pt that if at any point it was getting better then began to get more red, swelling or cloudy drainage she should call our office. Pt states she is not ready to get the other toenail procedure before Christmas, but will keep the appt to get this toenail area checked.

## 2017-12-03 ENCOUNTER — Encounter: Payer: Self-pay | Admitting: Podiatry

## 2017-12-03 ENCOUNTER — Ambulatory Visit (INDEPENDENT_AMBULATORY_CARE_PROVIDER_SITE_OTHER): Payer: Medicare Other | Admitting: Podiatry

## 2017-12-03 VITALS — BP 129/78 | HR 84 | Temp 98.9°F

## 2017-12-03 DIAGNOSIS — L600 Ingrowing nail: Secondary | ICD-10-CM | POA: Diagnosis not present

## 2017-12-03 DIAGNOSIS — I6389 Other cerebral infarction: Secondary | ICD-10-CM

## 2017-12-03 DIAGNOSIS — L03031 Cellulitis of right toe: Secondary | ICD-10-CM

## 2017-12-03 MED ORDER — DOXYCYCLINE HYCLATE 100 MG PO TABS: 100.0000 mg | ORAL_TABLET | Freq: Two times a day (BID) | ORAL | 0 refills | Status: DC

## 2017-12-03 MED ORDER — HYDROCODONE-ACETAMINOPHEN 10-325 MG PO TABS: 1.0000 | ORAL_TABLET | Freq: Three times a day (TID) | ORAL | 0 refills | Status: DC | PRN

## 2017-12-03 NOTE — Progress Notes (Signed)
Subjective:   Patient ID: [name], female   DOB: 71 y.o.   MRN: [id number]   HPI Patient presents stating I want to get the left hallux fixed and had some redness of my right big toe and I probably need an antibiotic.  States it started the last couple days   ROS      Objective:  Physical Exam  Neurovascular status unchanged with patient noted to have slight redness in the lateral border right hallux localized in nature with no proximal edema erythema or drainage noted and is noted to have incurvated left hallux nail medial lateral borders that are painful when pressed     Assessment:  Ingrown toenail deformity left hallux medial lateral border with on the right a slight paronychia infection of the right hallux lateral border     Plan:  H&P condition reviewed and discussed correction of the nail.  I did explain risk and she wants it done as I will be placing her on an antibiotic at this time for the right hallux it is a good opportunity to fix.  I explained procedure risk and she wants surgery and today infiltrated the left hallux 60 mg like Marcaine mixture I removed the medial lateral borders exposed matrix and applied phenol 3 applications 30 seconds all of the alcohol lavage.  Gave instructions on soaks and reappoint and discussed paronychia right based on doxycycline 100 mg twice daily and reappoint to recheck      hyd

## 2017-12-03 NOTE — Patient Instructions (Addendum)
Soak Instructions    THE DAY AFTER THE PROCEDURE  Place 1/4 cup of epsom salts in a quart of warm tap water.  Submerge your foot or feet with outer bandage intact for the initial soak; this will allow the bandage to become moist and wet for easy lift off.  Once you remove your bandage, continue to soak in the solution for 20 minutes.  This soak should be done twice a day.  Next, remove your foot or feet from solution, blot dry the affected area and cover.  You may use a band aid large enough to cover the area or use gauze and tape.  Apply other medications to the area as directed by the doctor such as polysporin neosporin.  IF YOUR SKIN BECOMES IRRITATED WHILE USING THESE INSTRUCTIONS, IT IS OKAY TO SWITCH TO  WHITE VINEGAR AND WATER. Or you may use antibacterial soap and water to keep the toe clean  Monitor for any signs/symptoms of infection. Call the office immediately if any occur or go directly to the emergency room. Call with any questions/concerns.    Long Term Care Instructions-Post Nail Surgery  You have had your ingrown toenail and root treated with a chemical.  This chemical causes a burn that will drain and ooze like a blister.  This can drain for 6-8 weeks or longer.  It is important to keep this area clean, covered, and follow the soaking instructions dispensed at the time of your surgery.  This area will eventually dry and form a scab.  Once the scab forms you no longer need to soak or apply a dressing.  If at any time you experience an increase in pain, redness, swelling, or drainage, you should contact the office as soon as possible.    Betadine Soak Instructions  Purchase an 8 oz. bottle of BETADINE solution (Povidone)  THE DAY AFTER THE PROCEDURE  Place 1 tablespoon of betadine solution in a quart of warm tap water.  Submerge your foot or feet with outer bandage intact for the initial soak; this will allow the bandage to become moist and wet for easy lift off.  Once you  remove your bandage, continue to soak in the solution for 20 minutes.  This soak should be done twice a day.  Next, remove your foot or feet from solution, blot dry the affected area and cover.  You may use a band aid large enough to cover the area or use gauze and tape.  Apply other medications to the area as directed by the doctor such as cortisporin otic solution (ear drops) or neosporin.  IF YOUR SKIN BECOMES IRRITATED WHILE USING THESE INSTRUCTIONS, IT IS OKAY TO SWITCH TO EPSOM SALTS AND WATER OR WHITE VINEGAR AND WATER.  Long Term Care Instructions-Post Nail Surgery  You have had your ingrown toenail and root treated with a chemical.  This chemical causes a burn that will drain and ooze like a blister.  This can drain for 6-8 weeks or longer.  It is important to keep this area clean, covered, and follow the soaking instructions dispensed at the time of your surgery.  This area will eventually dry and form a scab.  Once the scab forms you no longer need to soak or apply a dressing.  If at any time you experience an increase in pain, redness, swelling, or drainage, you should contact the office as soon as possible. 

## 2017-12-14 ENCOUNTER — Ambulatory Visit (INDEPENDENT_AMBULATORY_CARE_PROVIDER_SITE_OTHER): Payer: Medicare Other | Admitting: *Deleted

## 2017-12-14 DIAGNOSIS — I639 Cerebral infarction, unspecified: Secondary | ICD-10-CM

## 2017-12-16 ENCOUNTER — Encounter: Payer: Self-pay | Admitting: Podiatry

## 2017-12-16 ENCOUNTER — Ambulatory Visit (INDEPENDENT_AMBULATORY_CARE_PROVIDER_SITE_OTHER): Payer: Medicare Other | Admitting: Podiatry

## 2017-12-16 DIAGNOSIS — L03031 Cellulitis of right toe: Secondary | ICD-10-CM | POA: Diagnosis not present

## 2017-12-16 MED ORDER — DOXYCYCLINE HYCLATE 100 MG PO TABS: 100.0000 mg | ORAL_TABLET | Freq: Two times a day (BID) | ORAL | 0 refills | Status: DC

## 2017-12-16 NOTE — Progress Notes (Signed)
Subjective:   Patient ID: [name], female   DOB: 72 y.o.   MRN: [id number]   HPI Patient was concerned about some discoloration around the right hallux and stated it just started really in the last few days   ROS      Objective:  Physical Exam  Neurovascular status intact with patient showing no proximal edema erythema or drainage with localized irritation of the right hallux nail     Assessment:  Localized irritative type process that appears to be healing but still giving her some problems     Plan:  Reviewed condition and advised on continued soaks and Neosporin usage and is precautionary measure placed back on antibiotic again for the next week.  Gave strict instructions of any increased redness drainage or other issues were to occur to let us know immediately

## 2017-12-16 NOTE — Progress Notes (Signed)
Carelink Summary Report / Loop Recorder 

## 2017-12-17 ENCOUNTER — Telehealth: Payer: Self-pay | Admitting: Cardiology

## 2017-12-17 NOTE — Telephone Encounter (Signed)
LMOVM requesting that pt send manual transmission b/c home monitor has not updated in at least 14 days.    

## 2017-12-21 ENCOUNTER — Telehealth: Payer: Self-pay | Admitting: Neurology

## 2017-12-21 NOTE — Telephone Encounter (Signed)
Pt called her iron is low, she is wanting an infusion. She does have dysport injection on 1/10  FYI. Please call to advise

## 2017-12-21 NOTE — Telephone Encounter (Signed)
Discussed with Dr Brett Fairy and she is ok with the patient getting this infusion completed. I will place orders through [facility] and they can contact her about scheduling or plan on the 10th to complete.

## 2017-12-22 NOTE — Telephone Encounter (Signed)
Called the patient to make her aware that Dr Brett Fairy is ok with her getting an iron infusion. I have turned these orders into the infusion suite but they will have to get insurance approval before being able to schedule. They will work on getting the approval for the medication and once they get that they will contact her about scheduling. Pt didn't answer. LVM for the pt to call back. Please advise above information if the patient calls back.

## 2017-12-24 ENCOUNTER — Encounter: Payer: Self-pay | Admitting: Neurology

## 2017-12-24 ENCOUNTER — Ambulatory Visit (INDEPENDENT_AMBULATORY_CARE_PROVIDER_SITE_OTHER): Payer: Medicare Other | Admitting: Neurology

## 2017-12-24 ENCOUNTER — Telehealth: Payer: Self-pay | Admitting: *Deleted

## 2017-12-24 VITALS — BP 137/74 | HR 74

## 2017-12-24 DIAGNOSIS — G243 Spasmodic torticollis: Secondary | ICD-10-CM

## 2017-12-24 DIAGNOSIS — G43001 Migraine without aura, not intractable, with status migrainosus: Secondary | ICD-10-CM

## 2017-12-24 DIAGNOSIS — R1115 Cyclical vomiting syndrome unrelated to migraine: Secondary | ICD-10-CM

## 2017-12-24 MED ORDER — DICLOFENAC SODIUM 1 % TD GEL: 4.0000 g | Freq: Four times a day (QID) | TRANSDERMAL | 11 refills | Status: DC

## 2017-12-24 MED ORDER — ONDANSETRON 4 MG PO TBDP: 4.0000 mg | ORAL_TABLET | Freq: Three times a day (TID) | ORAL | 11 refills | Status: DC | PRN

## 2017-12-24 MED ORDER — TOPIRAMATE 100 MG PO TABS: 200.0000 mg | ORAL_TABLET | Freq: Every day | ORAL | 11 refills | Status: DC

## 2017-12-24 MED ORDER — BACLOFEN 10 MG PO TABS: 10.0000 mg | ORAL_TABLET | Freq: Two times a day (BID) | ORAL | 11 refills | Status: DC

## 2017-12-24 MED ORDER — ERENUMAB-AOOE 70 MG/ML ~~LOC~~ SOAJ: 2.0000 "pen " | SUBCUTANEOUS | 0 refills | Status: DC

## 2017-12-24 NOTE — Progress Notes (Signed)
Dysport 500 units/vial x 2 vial LOT: [id number] EXP: 04/13/2018 NDC: [address]-3  Bacteriostatic 0.9% Sodium Chloride- 22mL total Lot: [id number] Expiration: 04/15/2019 NDC: 2563-8937-34  Dx: G43.3 B/B //BCrn

## 2017-12-24 NOTE — Telephone Encounter (Signed)
Patient given Aimovig 70 mg / 1 mL sample x 2 (1 box).

## 2017-12-28 NOTE — Addendum Note (Signed)
Addended by: Sarina Ill B on: 12/28/2017 06:19 PM   Modules accepted: Level of Service

## 2017-12-28 NOTE — Progress Notes (Addendum)
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 She has significant pain, decreased ROM which is chronic for over one year. She gets regular massage. We have performed multiple trigger point injections with Depo-Medrol, lidocaine and Marcaine. She has tried baclofen, Cymbalta, oxycodone, Lyrica, gabapentin, Robaxin without any relief. Patient would benefit from Physical therapy for stretching, strengthening, manual therapy and massage as well as dry needling of the right trapezius muscle and any other muscles o modalities as clinically warranted by physical therapy evaluation   Procedure note for Spasmodic torticollis:  EMG: EMG guidance was used to inject muscles detailed below. Aseptic procedure was performed and patient tolerated procedure. Procedure was performed by Dr. Myrla Halsted   Patient tolerated the procedure.   Refractory to oral medications   Motrin / tylenol for injections site pain / soreness   REMS precautions handout given to patient   RTC - see instructions for details   Wasted 200 Units.   Dysport-500 units [id number] x 2 vials   Dysport-500unitsx2vial Lot: [id number] Expiration: 03/14/18 NDC: [address]-7 [id number]  0.9% Sodium Chloride bacteriostatic- 72mL total Lot: [id number] Expiration: 05/15/18 NDC: 4827-0786-75   DX: G24.3 [employer] and [employer]  Muscles injected:Muscles injected:  75 each right and left levator scapula(150 total).  250right and 200  lefttrapezius, 100right semispinalis capitus, 100 right splenius capitus

## 2017-12-29 LAB — CUP PACEART REMOTE DEVICE CHECK
Date Time Interrogation Session: 20181231190915
MDC IDC PG IMPLANT DT: 20171206

## 2018-01-12 DIAGNOSIS — R05 Cough: Secondary | ICD-10-CM | POA: Diagnosis not present

## 2018-01-12 DIAGNOSIS — Z79899 Other long term (current) drug therapy: Secondary | ICD-10-CM | POA: Diagnosis not present

## 2018-01-12 DIAGNOSIS — M1990 Unspecified osteoarthritis, unspecified site: Secondary | ICD-10-CM | POA: Diagnosis not present

## 2018-01-12 DIAGNOSIS — F988 Other specified behavioral and emotional disorders with onset usually occurring in childhood and adolescence: Secondary | ICD-10-CM | POA: Diagnosis not present

## 2018-01-12 DIAGNOSIS — H1033 Unspecified acute conjunctivitis, bilateral: Secondary | ICD-10-CM | POA: Diagnosis not present

## 2018-01-13 ENCOUNTER — Ambulatory Visit (INDEPENDENT_AMBULATORY_CARE_PROVIDER_SITE_OTHER): Payer: Medicare Other | Admitting: *Deleted

## 2018-01-13 DIAGNOSIS — I639 Cerebral infarction, unspecified: Secondary | ICD-10-CM | POA: Diagnosis not present

## 2018-01-14 ENCOUNTER — Encounter: Payer: Self-pay | Admitting: Interventional Cardiology

## 2018-01-14 NOTE — Progress Notes (Signed)
Carelink Summary Report / Loop Recorder 

## 2018-01-21 ENCOUNTER — Telehealth: Payer: Self-pay | Admitting: *Deleted

## 2018-01-21 DIAGNOSIS — D519 Vitamin B12 deficiency anemia, unspecified: Secondary | ICD-10-CM | POA: Diagnosis not present

## 2018-01-21 MED ORDER — ERENUMAB-AOOE 70 MG/ML ~~LOC~~ SOAJ: 140.0000 mg | SUBCUTANEOUS | 0 refills | Status: DC

## 2018-01-21 NOTE — Telephone Encounter (Signed)
Patient requested Aimovig samples while here in office. Per Dr. [name], may have 3 boxes. Patient given medications and order placed in chart.

## 2018-01-22 ENCOUNTER — Encounter: Payer: Self-pay | Admitting: Interventional Cardiology

## 2018-01-22 ENCOUNTER — Ambulatory Visit (INDEPENDENT_AMBULATORY_CARE_PROVIDER_SITE_OTHER): Payer: Medicare Other | Admitting: Interventional Cardiology

## 2018-01-22 VITALS — BP 116/76 | HR 89 | Ht 66.0 in | Wt 230.2 lb

## 2018-01-22 DIAGNOSIS — I639 Cerebral infarction, unspecified: Secondary | ICD-10-CM | POA: Diagnosis not present

## 2018-01-22 DIAGNOSIS — I480 Paroxysmal atrial fibrillation: Secondary | ICD-10-CM | POA: Insufficient documentation

## 2018-01-22 DIAGNOSIS — E7801 Familial hypercholesterolemia: Secondary | ICD-10-CM | POA: Diagnosis not present

## 2018-01-22 NOTE — Patient Instructions (Signed)
Medication Instructions:  Your physician recommends that you continue on your current medications as directed. Please refer to the Current Medication list given to you today.   Labwork: None   Testing/Procedures: None   Follow-Up: Your physician recommends that you schedule a follow-up appointment with our [facility].  Your physician recommends that you schedule a follow-up appointment in 1 year with Dr. Tamala Julian.    Any Other Special Instructions Will Be Listed Below (If Applicable).     If you need a refill on your cardiac medications before your next appointment, please call your pharmacy.

## 2018-01-22 NOTE — Progress Notes (Signed)
1   Cardiology Office Note    Date:  01/22/2018   ID:  [name], DOB 05/03/1946, MRN [id number]  PCP:  [name], MD  Cardiologist: Sinclair Grooms, MD   Chief Complaint  Patient presents with  . Follow-up    Loop recorder/surveillance for atrial fibrillation/history of CVA.    History of Present Illness:  [name] is a 72 y.o. female with a history of obstructive sleep apnea, severe hyperlipidemia, anomalous origin of the right coronary, secondary pulmonary hypertension, lower extremity swelling, recent CVA.  Loop recorder implantation to exclude A. fib.  Patient has no cardiac complaints.  She has had no tachycardia or significant palpitations.  Loop recorder has been present for slightly greater than a year and has not generated any data suggestive of atrial fibrillation.  The patient cannot afford her previous PCS K-9 therapy.  Her current lipid profile was extremely abnormal on Zetia only.  She denies chest pain.  She has had no new stroke symptoms.   Past Medical History:  Diagnosis Date  . 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 Arthritis    generalized  . Asthma   . Atrophic vaginitis   . Back pain    DDD/stenosis  . Carotid stenosis    Carotid US 10/16: Plaque RICA (3-29%), normal LICA  . Depression    takes Cymbalta daily  . Diverticulosis    benign  . DJD (degenerative joint disease)   . Dyslipidemia   . Dysrhythmia   . Family history of GI bleeding   . GERD (gastroesophageal reflux disease)    takes Nexium daily  . Gestational diabetes   . H/O hiatal hernia    surgery for hernia  . Headache(784.0)    takes Imitrex daily as needed and Bisoprolol daily;last migraine was about 2wks ago  . History of bronchitis 1 yr ago  . History of shingles   . Insomnia    takes Ambien nightly  . Joint pain   . Joint swelling   . Leg cramps    takes Flexeril daily as needed  . 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 Nocturia   . Osteoporosis     . Peripheral neuropathy    takes Gabapentin daily  . Pneumonia 92yrs ago   hx of  . Restless leg syndrome    takes Requip daily  . RLS (restless legs syndrome) 08/16/2015  . Sleep apnea    uses CPAP  . Tubular adenoma of colon   . Vertigo   . Vitamin D deficiency     Past Surgical History:  Procedure Laterality Date  . COLONOSCOPY    . EP IMPLANTABLE DEVICE N/A 11/19/2016   Procedure: Loop Recorder Insertion;  Surgeon: Deboraha Sprang, MD;  Location: [facility];  Service: Cardiovascular;  Laterality: N/A;  . ESOPHAGOGASTRODUODENOSCOPY    . excess skin removal     post weight loss  . GASTRIC BYPASS  2001  . HERNIA REPAIR     umbilical  . JOINT REPLACEMENT Right    knee  . skin reduction  2003 2004   stomach and arm  . STEROID INJECTION TO SCAR     x 2 in back   . TOTAL KNEE ARTHROPLASTY Left 04/03/2015   Procedure: LEFT TOTAL KNEE ARTHROPLASTY;  Surgeon: Melrose Nakayama, MD;  Location: [facility];  Service: Orthopedics;  Laterality: Left;  . WISDOM TOOTH EXTRACTION      Current Medications:  Outpatient Medications Prior to Visit  Medication Sig Dispense Refill  . amphetamine-dextroamphetamine (ADDERALL) 10 MG tablet Take 10 mg by mouth daily with breakfast.   0  . baclofen (LIORESAL) 10 MG tablet Take 1 tablet (10 mg total) by mouth 2 (two) times daily. 60 each 11  . bisoprolol (ZEBETA) 10 MG tablet Take 10 mg by mouth 2 (two) times daily.    . calcium carbonate (OS-CAL) 1250 (500 CA) MG chewable tablet Chew 1 tablet by mouth daily. Not sure of dosage    . diclofenac sodium (VOLTAREN) 1 % GEL Apply 4 g topically 4 (four) times daily. 100 g 11  . DULoxetine (CYMBALTA) 60 MG capsule Take 60 mg by mouth 2 (two) times daily.  11  . Erenumab-aooe (AIMOVIG 140 DOSE) 70 MG/ML SOAJ Inject 2 pens into the skin every 30 (thirty) days. 2 pen 0  . esomeprazole (NEXIUM) 40 MG capsule Take 40 mg by mouth 2 (two) times daily before a meal.   6  . furosemide (LASIX) 20 MG tablet Take 40  mg by mouth daily as needed for fluid or edema.    Marland Kitchen GABAPENTIN PO Take 300 mg by mouth 2 (two) times daily.     Marland Kitchen HYDROcodone-acetaminophen (NORCO) 10-325 MG tablet Take 1 tablet by mouth every 8 (eight) hours as needed for moderate pain or severe pain.    . Loratadine (CLARITIN) 10 MG CAPS Take 1 capsule by mouth as needed (for allergies).     . ondansetron (ZOFRAN ODT) 4 MG disintegrating tablet Take 1 tablet (4 mg total) by mouth every 8 (eight) hours as needed for nausea or vomiting. 45 tablet 11  . PROAIR HFA 108 (90 Base) MCG/ACT inhaler 2 INHALATIONS EVERY 4 HOURS AS NEEDED FOR ASTHMA SYMPTOMS (WHEEZING/SHORTNESS OF BREATH)  5  . quiNINE (QUALAQUIN) 324 MG capsule TAKE 1 CAPSULE BY MOUTH TWICE DAILY 180 capsule 0  . rOPINIRole (REQUIP) 1 MG tablet Take 1 tablet (1 mg total) by mouth 2 (two) times daily before lunch and supper. 180 tablet 3  . topiramate (TOPAMAX) 100 MG tablet Take 2 tablets (200 mg total) by mouth at bedtime. 60 tablet 11  . Vitamin D, Ergocalciferol, (DRISDOL) 50000 units CAPS capsule Take 1 capsule (50,000 Units total) by mouth every 7 (seven) days. 30 capsule 3  . Diclofenac Potassium (CAMBIA) 50 MG PACK Take 50 mg by mouth at bedtime as needed. 1 each 11  . doxycycline (VIBRA-TABS) 100 MG tablet Take 1 tablet (100 mg total) by mouth 2 (two) times daily. (Patient not taking: Reported on 01/22/2018) 20 tablet 0  . doxycycline (VIBRA-TABS) 100 MG tablet Take 1 tablet (100 mg total) by mouth 2 (two) times daily. (Patient not taking: Reported on 01/22/2018) 20 tablet 0  . Erenumab-aooe (AIMOVIG 140 DOSE) 70 MG/ML SOAJ Inject 140 mg into the skin every 30 (thirty) days. (Patient not taking: Reported on 01/22/2018) 6 pen 0  . Erenumab-aooe 70 MG/ML SOAJ Inject 140 mg into the skin every 30 (thirty) days. (Patient not taking: Reported on 01/22/2018) 2 pen 11  . furosemide (LASIX) 20 MG tablet Take 40 mg by mouth daily.    Marland Kitchen HYDROcodone-acetaminophen (NORCO) 10-325 MG tablet Take 1  tablet by mouth every 8 (eight) hours as needed. (Patient not taking: Reported on 01/22/2018) 30 tablet 0  . HYDROcodone-acetaminophen (NORCO) 10-325 MG tablet Take 1 tablet by mouth every 8 (eight) hours as needed. (Patient not taking: Reported on 01/22/2018) 30 tablet 0  . Vitamin D,  Ergocalciferol, (DRISDOL) 50000 units CAPS capsule Take 1 capsule (50,000 Units total) by mouth every 7 (seven) days. (Patient not taking: Reported on 01/22/2018) 12 capsule 4   No facility-administered medications prior to visit.      Allergies:   Patient has no known allergies.   Social History   Socioeconomic History  . Marital status: Married    Spouse name: Pilar Jarvis  . Number of children: 3  . Years of education: college  . Highest education level: None  Social Needs  . Financial resource strain: None  . Food insecurity - worry: None  . Food insecurity - inability: None  . Transportation needs - medical: None  . Transportation needs - non-medical: None  Occupational History  . Occupation: Retired  Tobacco Use  . Smoking status: Never Smoker  . Smokeless tobacco: Never Used  Substance and Sexual Activity  . Alcohol use: No    Alcohol/week: 0.0 oz  . Drug use: No  . Sexual activity: Yes    Birth control/protection: Post-menopausal  Other Topics Concern  . None  Social History Narrative   Lives at home w/ her husband   Caffeine 8-10 cups daily.  (coffee 1 cup am, unsw tea all day long).      Family History:  The patient's family history includes Breast cancer in her maternal grandmother; CAD in her mother and paternal grandfather; Colon cancer in her father; Heart disease in her mother; Hypertension in her mother; Neuropathy in her mother; Osteoarthritis in her mother.   ROS:   Please see the history of present illness.    History of migraine headaches.  Unable to afford previous PCS K-9 therapy. All other systems reviewed and are negative.   PHYSICAL EXAM:   VS:  BP 116/76   Pulse 89    Ht 5\' 6"  (1.676 m)   Wt 230 lb 3.2 oz (104.4 kg)   BMI 37.16 kg/m    GEN: Well nourished, well developed, in no acute distress moderate severe obesity. HEENT: normal  Neck: no JVD, carotid bruits, or masses Cardiac: RRR; no murmurs, rubs, or gallops,no edema  Respiratory:  clear to auscultation bilaterally, normal work of breathing GI: soft, nontender, nondistended, + BS MS: no deformity or atrophy  Skin: warm and dry, no rash Neuro:  Alert and Oriented x 3, Strength and sensation are intact Psych: euthymic mood, full affect  Wt Readings from Last 3 Encounters:  01/22/18 230 lb 3.2 oz (104.4 kg)  09/14/17 218 lb (98.9 kg)  07/29/17 224 lb (101.6 kg)      Studies/Labs Reviewed:   EKG:  EKG normal sinus rhythm and normal overall tracing.  Recent Labs: No results found for requested labs within last 8760 hours.   Lipid Panel    Component Value Date/Time   CHOL 223 (H) 02/27/2016 1001   TRIG 143 02/27/2016 1001   HDL 48 02/27/2016 1001   CHOLHDL 4.6 (H) 02/27/2016 1001   LDLCALC 146 (H) 02/27/2016 1001    Additional studies/ records that were reviewed today include:  Of interest is the severe elevation in LDL cholesterol which is 145 in August 2018, very similar to the 2017 value noted above.  Only current therapy is Zetia.    ASSESSMENT:    1. Paroxysmal atrial fibrillation (HCC)   2. Familial hypercholesterolemia   3. Morbid obesity ([place])   4. Cerebrovascular accident (CVA), unspecified mechanism ([place])      PLAN:  In order of problems listed above:  1. It  is been 1.4 years since loop recorder implantation.  Atrial fibrillation is never been documented on surface EKGs or the loop recorder.  Is seeming unlikely that atrial fibrillation was a source of the patient's stroke. 2. Cholesterol levels are extremely high.  The patient has had a stroke.  LDL on Zetia is greater than 140.  She has previously been on [place] therapy but could not afford the co-pay.  We  will have her sent back to lipid clinic to determine if there is a way to help with risk management of lipids. 3. Encouraged to decrease caloric intake and increase aerobic activity 4. No specific instructions given other than the importance of lipid management.  Clinical follow-up in 1 year.  Continue surveillance of loop recorder.    Medication Adjustments/Labs and Tests Ordered: Current medicines are reviewed at length with the patient today.  Concerns regarding medicines are outlined above.  Medication changes, Labs and Tests ordered today are listed in the Patient Instructions below. Patient Instructions  Medication Instructions:  Your physician recommends that you continue on your current medications as directed. Please refer to the Current Medication list given to you today.   Labwork: None   Testing/Procedures: None   Follow-Up: Your physician recommends that you schedule a follow-up appointment with our [facility].  Your physician recommends that you schedule a follow-up appointment in 1 year with Dr. Tamala Julian.    Any Other Special Instructions Will Be Listed Below (If Applicable).     If you need a refill on your cardiac medications before your next appointment, please call your pharmacy.      Signed, Sinclair Grooms, MD  01/22/2018 6:21 PM    [address], [place], [place]  [address] Phone: [phone]; Fax: [phone]

## 2018-01-26 ENCOUNTER — Ambulatory Visit
Admission: RE | Admit: 2018-01-26 | Discharge: 2018-01-26 | Disposition: A | Discharge status: Home / Self Care | Payer: Medicare Other | Source: Ambulatory Visit | Attending: Obstetrics and Gynecology | Admitting: Obstetrics and Gynecology

## 2018-01-26 ENCOUNTER — Other Ambulatory Visit: Payer: Self-pay | Admitting: Obstetrics and Gynecology

## 2018-01-26 DIAGNOSIS — Z1231 Encounter for screening mammogram for malignant neoplasm of breast: Secondary | ICD-10-CM

## 2018-01-26 LAB — CUP PACEART REMOTE DEVICE CHECK
Date Time Interrogation Session: 20190130194155
MDC IDC PG IMPLANT DT: 20171206

## 2018-01-26 IMAGING — MG DIGITAL SCREENING BILATERAL MAMMOGRAM WITH TOMO AND CAD
8 series · 8 of 24 positions shown · non-contrast
Comparison: Previous exam(s).

ACR Breast Density Category a: The breast tissue is almost entirely
fatty.

CLINICAL DATA: Screening.

EXAM:
DIGITAL SCREENING BILATERAL MAMMOGRAM WITH TOMO AND CAD

[L CC synth-2D]
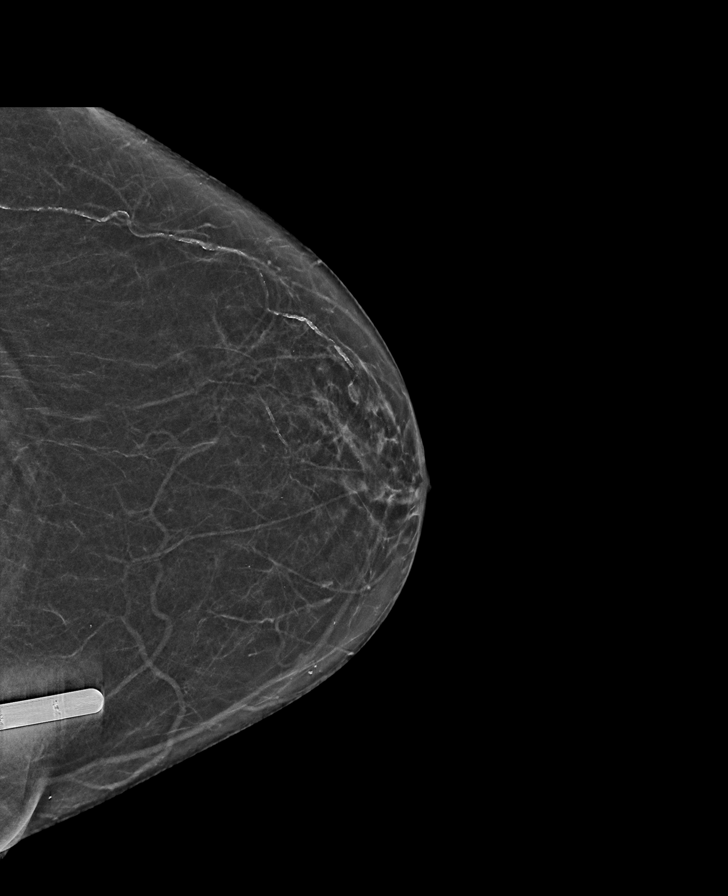

[R CC synth-2D]
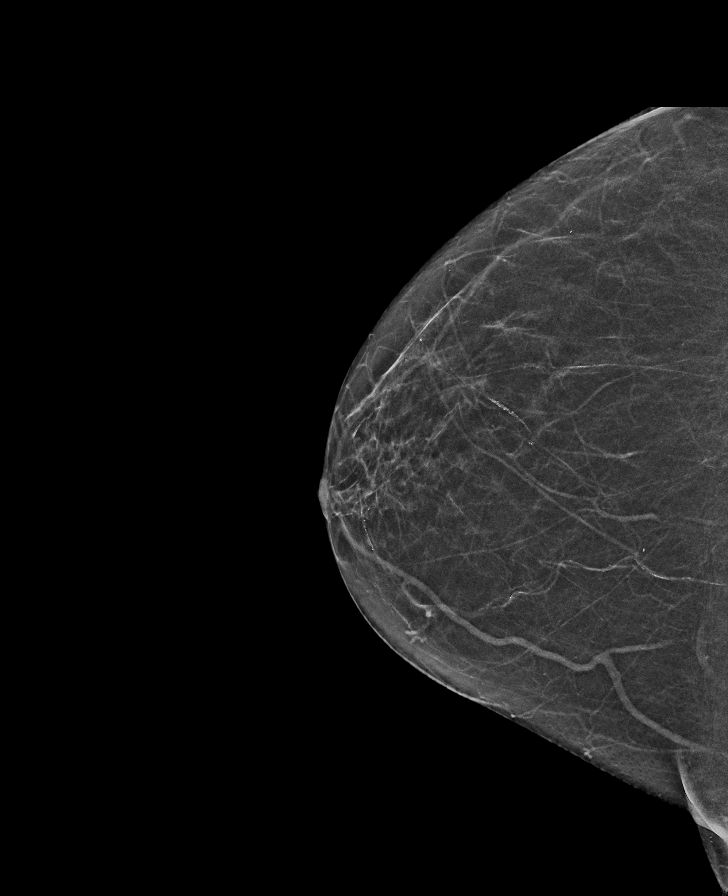

[L MLO synth-2D]
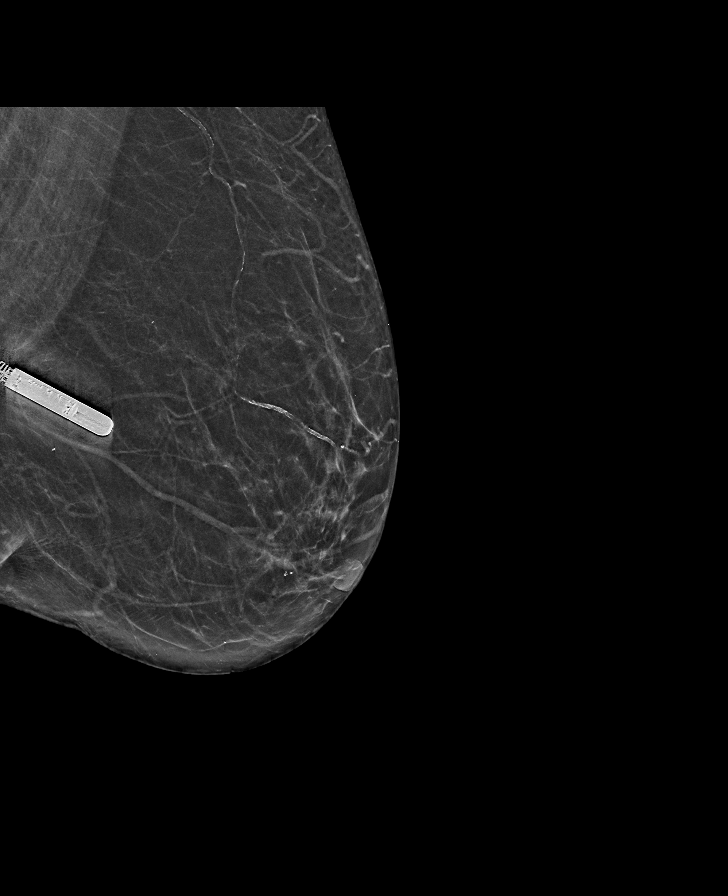

[R MLO synth-2D]
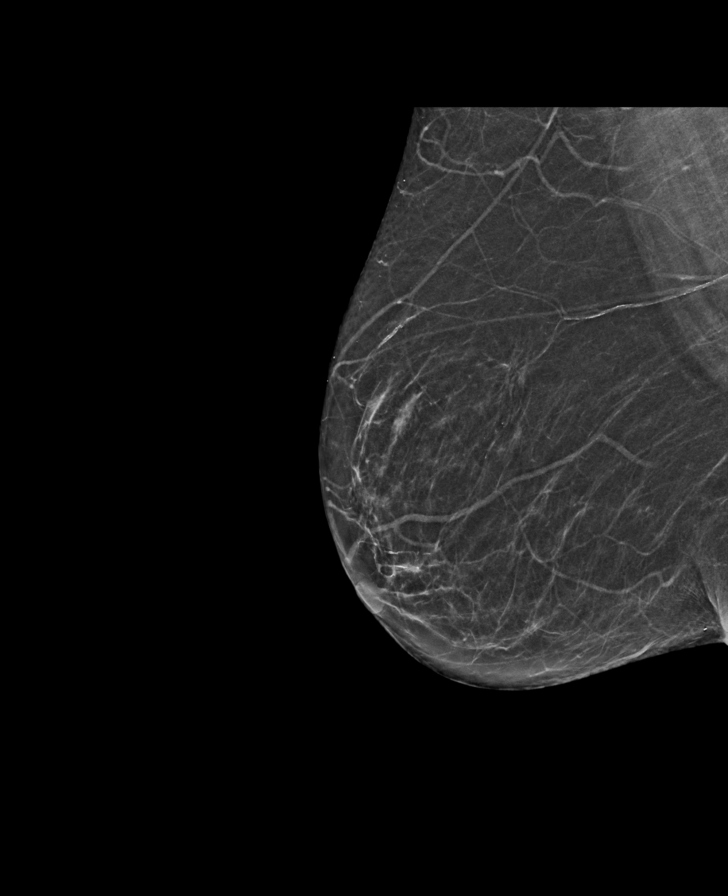

[R CC tomo · tomo slice 26/51.0]
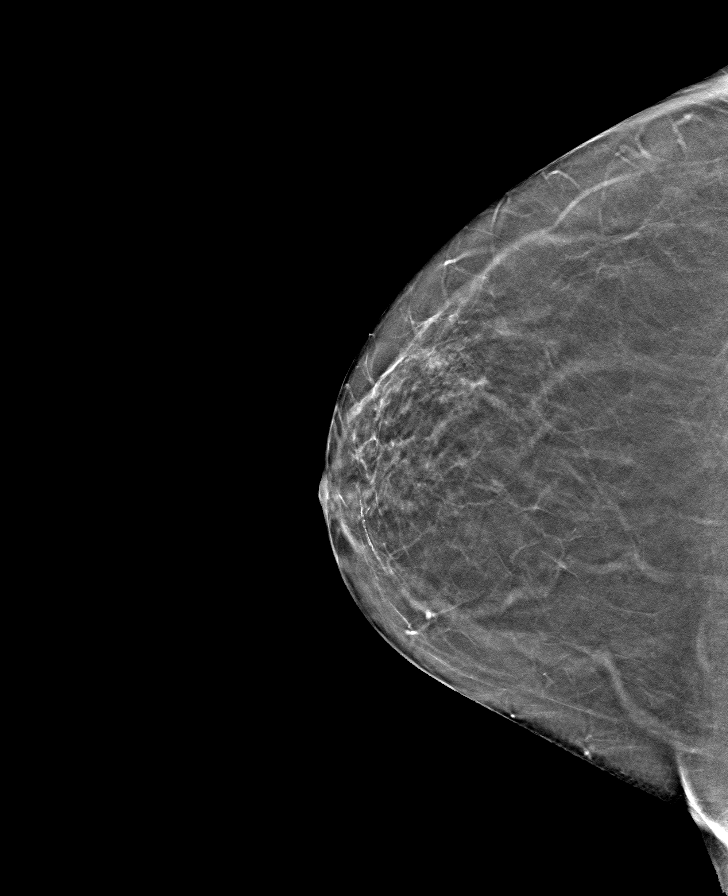

[L CC tomo · tomo slice 27/54.0]
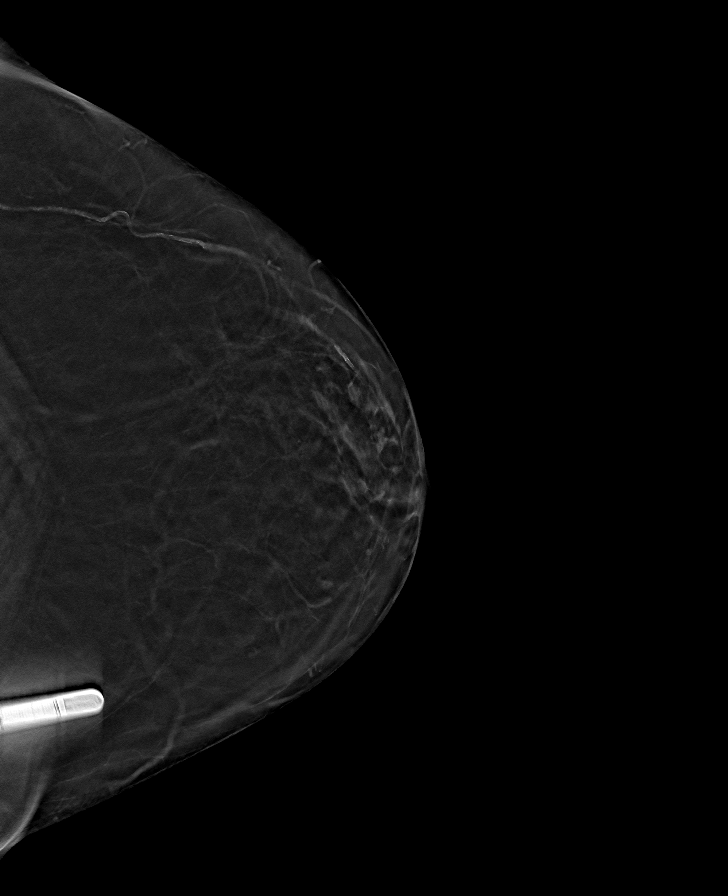

[L MLO tomo · tomo slice 31/60.0]
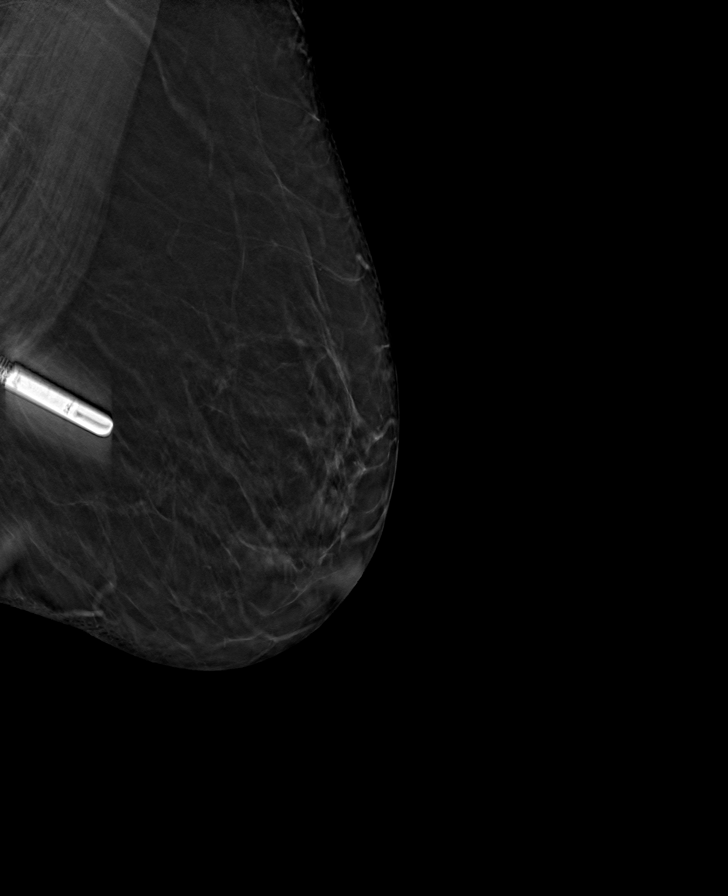

[R MLO tomo · tomo slice 29/57.0]
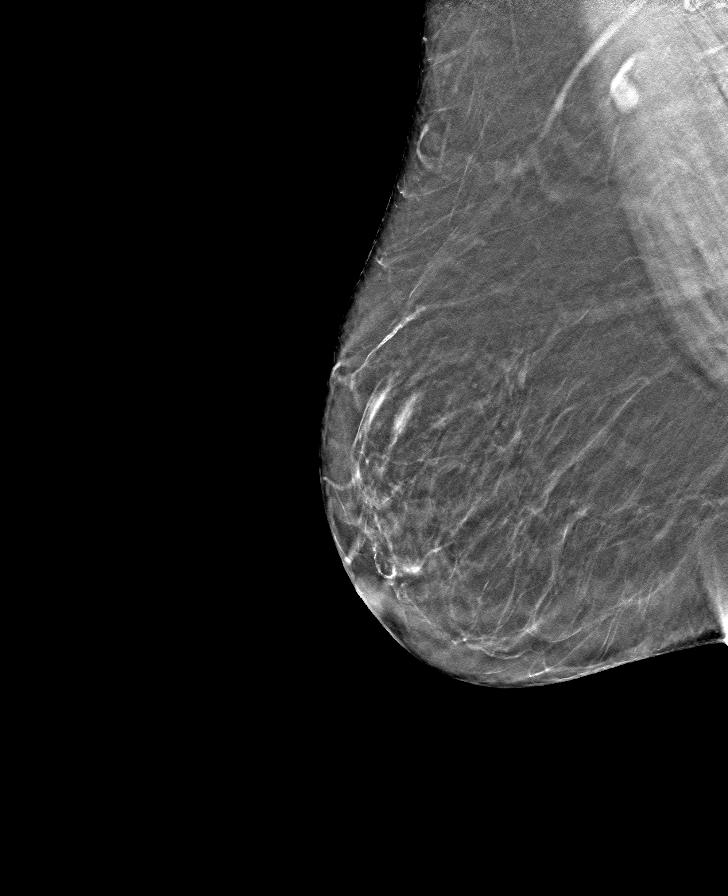

[8 of 24 positions shown; findings below may reference images not displayed]

FINDINGS: There are no findings suspicious for malignancy. Images were
processed with CAD.
IMPRESSION: No mammographic evidence of malignancy. A result letter of this
screening mammogram will be mailed directly to the patient.

RECOMMENDATION:
Screening mammogram in one year. (Code:[TA])

BI-RADS CATEGORY  1: Negative.

## 2018-01-27 ENCOUNTER — Other Ambulatory Visit: Payer: Self-pay | Admitting: Dermatology

## 2018-01-27 DIAGNOSIS — D485 Neoplasm of uncertain behavior of skin: Secondary | ICD-10-CM | POA: Diagnosis not present

## 2018-01-27 DIAGNOSIS — Z808 Family history of malignant neoplasm of other organs or systems: Secondary | ICD-10-CM | POA: Diagnosis not present

## 2018-01-27 DIAGNOSIS — D225 Melanocytic nevi of trunk: Secondary | ICD-10-CM | POA: Diagnosis not present

## 2018-01-27 DIAGNOSIS — L821 Other seborrheic keratosis: Secondary | ICD-10-CM | POA: Diagnosis not present

## 2018-01-27 DIAGNOSIS — D1801 Hemangioma of skin and subcutaneous tissue: Secondary | ICD-10-CM | POA: Diagnosis not present

## 2018-01-27 DIAGNOSIS — L309 Dermatitis, unspecified: Secondary | ICD-10-CM | POA: Diagnosis not present

## 2018-01-29 ENCOUNTER — Ambulatory Visit
Admission: RE | Admit: 2018-01-29 | Discharge: 2018-01-29 | Disposition: A | Discharge status: Home / Self Care | Payer: Medicare Other | Source: Ambulatory Visit | Attending: Dermatology | Admitting: Dermatology

## 2018-01-29 DIAGNOSIS — M79661 Pain in right lower leg: Secondary | ICD-10-CM | POA: Diagnosis not present

## 2018-01-29 DIAGNOSIS — D485 Neoplasm of uncertain behavior of skin: Secondary | ICD-10-CM

## 2018-01-31 ENCOUNTER — Encounter: Payer: Self-pay | Admitting: Neurology

## 2018-02-01 DIAGNOSIS — R3915 Urgency of urination: Secondary | ICD-10-CM | POA: Diagnosis not present

## 2018-02-01 DIAGNOSIS — N898 Other specified noninflammatory disorders of vagina: Secondary | ICD-10-CM | POA: Diagnosis not present

## 2018-02-01 DIAGNOSIS — R350 Frequency of micturition: Secondary | ICD-10-CM | POA: Diagnosis not present

## 2018-02-01 DIAGNOSIS — R300 Dysuria: Secondary | ICD-10-CM | POA: Diagnosis not present

## 2018-02-02 ENCOUNTER — Encounter: Payer: Self-pay | Admitting: Neurology

## 2018-02-02 ENCOUNTER — Ambulatory Visit (INDEPENDENT_AMBULATORY_CARE_PROVIDER_SITE_OTHER): Payer: Medicare Other | Admitting: Neurology

## 2018-02-02 ENCOUNTER — Other Ambulatory Visit: Payer: Self-pay | Admitting: Neurology

## 2018-02-02 VITALS — BP 113/66 | HR 72 | Ht 66.0 in | Wt 230.0 lb

## 2018-02-02 DIAGNOSIS — I639 Cerebral infarction, unspecified: Secondary | ICD-10-CM

## 2018-02-02 DIAGNOSIS — D501 Sideropenic dysphagia: Secondary | ICD-10-CM

## 2018-02-02 DIAGNOSIS — Z79899 Other long term (current) drug therapy: Secondary | ICD-10-CM | POA: Diagnosis not present

## 2018-02-02 DIAGNOSIS — G4733 Obstructive sleep apnea (adult) (pediatric): Secondary | ICD-10-CM | POA: Diagnosis not present

## 2018-02-02 DIAGNOSIS — R29818 Other symptoms and signs involving the nervous system: Secondary | ICD-10-CM | POA: Diagnosis not present

## 2018-02-02 DIAGNOSIS — Z5181 Encounter for therapeutic drug level monitoring: Secondary | ICD-10-CM | POA: Diagnosis not present

## 2018-02-02 DIAGNOSIS — D509 Iron deficiency anemia, unspecified: Secondary | ICD-10-CM | POA: Diagnosis not present

## 2018-02-02 DIAGNOSIS — Z9989 Dependence on other enabling machines and devices: Secondary | ICD-10-CM | POA: Diagnosis not present

## 2018-02-02 DIAGNOSIS — Z9884 Bariatric surgery status: Secondary | ICD-10-CM | POA: Insufficient documentation

## 2018-02-02 DIAGNOSIS — I10 Essential (primary) hypertension: Secondary | ICD-10-CM | POA: Diagnosis not present

## 2018-02-02 DIAGNOSIS — E538 Deficiency of other specified B group vitamins: Secondary | ICD-10-CM | POA: Diagnosis not present

## 2018-02-02 MED ORDER — VITAMIN D (ERGOCALCIFEROL) 1.25 MG (50000 UNIT) PO CAPS: 50000.0000 [IU] | ORAL_CAPSULE | ORAL | 3 refills | Status: DC

## 2018-02-02 MED ORDER — QUININE SULFATE 324 MG PO CAPS: 324.0000 mg | ORAL_CAPSULE | Freq: Two times a day (BID) | ORAL | 0 refills | Status: DC

## 2018-02-02 MED ORDER — ROPINIROLE HCL 1 MG PO TABS: ORAL_TABLET | ORAL | 3 refills | Status: DC

## 2018-02-02 MED ORDER — AMPHETAMINE-DEXTROAMPHETAMINE 10 MG PO TABS: 10.0000 mg | ORAL_TABLET | Freq: Every day | ORAL | 0 refills | Status: DC

## 2018-02-02 MED ORDER — QUININE SULFATE 324 MG PO CAPS: 324.0000 mg | ORAL_CAPSULE | Freq: Two times a day (BID) | ORAL | 3 refills | Status: DC

## 2018-02-02 NOTE — Progress Notes (Signed)
SLEEP MEDICINE CLINIC   Provider:  Larey Seat, M D  Referring Provider: [name], MD Primary Care Physician:    Chief Complaint  Patient presents with  . Follow-up    pt alone, rm 10. pt states things are ok. CPAP is working well. DME [facility].     HPI:  [name] is a 72 y.o. female  Is seen here as a revisit for snoring and restless legs. She has severe OSA , controlled on CPAP and hypersomnia. She has been diagnosed with dystonia , cervicalgia and ophthalmic migraines.   I have followed [name] from 2003 through 2008  And see her now for CPAP compliance.  The patient had undergone gastric bypass surgery, which led to an extensive loss of body weight. In the process she was no longer suffering from obstructive sleep apnea related to the weight loss, but she developed restless legs. Repeated iron studies had shown that she had extremely low ferritin levels. A ferritin level lower than 50 is associated with a much higher incidence of restless leg syndrome in addition her iron and iron binding capacities have been surprisingly normal and this conflict of data we have discussed today. Usually I would treat the patient with Ferrlecit and infusion once every 3 months or so to keep up ferritin levels in in her case I am not sure how much free iron is actually in her system. 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 In addition she is vitamin D deficient which are almost 80% of adult Americans. Gained some weight back but she has no longer nausea or dumping syndrome so therefore she does not need Phenergan or Zofran. She is using gabapentin and ropinirole to treat her restless legs she often wakes up with a headache and she suffers from at times severe crippling migraines. Leg cramps also interfere with her sleep the initiation of sleep as well as the sleep duration at night.  As her BMI exceeds 35, she may also have developed apnea again and snoring has  been witnessed by her family. We are meeting today to reevaluate all these conditions in context. She is also excessively daytime sleepy for fatigue severity score is endorsed at 63 points her Epworth sleepiness score was endorsed at 15 points. Bedtime is usually 10 Pm and her husband leaves for work at 6.30 AM , after that she will sleep for 2-3 hours. Total daily sleep time is 14 hours, this is more likely related to depression.   [name] is [birth year]. #1 She is doing moderately well with her CPAP use was a compliance of 73% average user time on days of use is 4 hours and 44 minutes. Minimum pressure for this AutoSet 7 maximum 15 cm water. The patient's AHI was 2.5 which is a desirable result. The 95th percentile pressure is 12.3. The patient could not use the machine for about 2 weeks after she contracted an upper respiratory infection doing a vacation stay in [place]. She is now back to using it regularly.  #2 her restless leg question here of was endorsed at moderate impairment quality of life.  It seems that her iron levels have rebounded to some degree , but Ferritin results are pending. Her geriatric depression score does not indicate depression her Epworth sleepiness score was endorsed at 8 points and her fatigue severity score 26 points.  #3 headache are a concern. She hates the pain clinic. We are not providing pain management in this  clinic but I do feel that the patient could change with the same narcotic agreement and contract to [name]  Her main care provider. She has no history of overusing up using or accidentally overdosing any medications. Her depression scores are in normal range not indicated clinical depression at all. The patient is aware that she could receive with a headache attack Depakote IV. However I am not thrilled with the opportunity of preventing migrainous headaches by Depakote, given the risk of weight gain, liver failure tremor. She has been on a beta blocker which has  helped to some degree. She would be interested in some kind of injection therapy but I explained that Botox injections in the Medicare population on very difficult to get paid for. And Botox is an extremely costly medication these days. The patient has some basic medical training and she would be willing to give herself a shot. We discussed TORADOL  which can be taken either as a 10 mg 2 pill or as an IM medication.  #4 patient will receive 1 mL equaling 2,000,000 g of Toradol intramuscular injection today for her acute headache. If this works well for her it could be an alternative treatment and hopefully will relieve her of the need to take narcotics to. The patient is a status post gastric bypass surgery so she cannot have nonsteroidal by mouth .  Interval history from 04/09/2016. [name] is seen here today after she has twice been seen by my colleague Dr. [name]. I had sent her to her to evaluate her headaches and possible dystonia and neuralgic character and she was referred for Dr. Kittie Plater for possible Botox injections. Within the workup for headaches and MRI of the brain was obtained. This showed 2 small wedge shaped foci in the left hemisphere consistent with prior small ischemic strokes. She was started on aspirin baby size and Zetia. There was chronic microvascular ischemic change as expected for age, some cortical atrophy no acute findings. At the time that she saw Dr. [name] she had neck pain for 7-8 months already. Voltaren gel helps a little bit pain on movement. She has migraines without aura that started already when she was a teenager. Tried topiramate in the past has failed amitriptyline, try Toradol, tried Imitrex , Cymbalta.  She has weaned off all pain medications.  CT of the soft tissue of the neck was also reviewed ; there is evidence of cervical disc and facet degeneration. An echocardiogram was ordered revealing a patent foramen ovale or she is scheduled for an occipital nerve block  with Dr. Dr. Kittie Plater, he will do a second one next coming Friday. This has helped her neck pain. She has not seen her cardiologist.  [name] presents today on 10/23/2016. She has recently seen Dr. [name] again who has been successfully treating her head and neck pain. 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 The pain has improved. As to her CPAP use is 87% compliant with an average user time of 6 hours and 9 minutes, she is using an AutoSet between 7 and 15 cm water. Her 95th percentile pressure is 14.4 and straddles close to the maximum pressure allowed. A residual AHI is 7.2 and the residual apneas are obstructive in nature. I do think we should increase the pressure window by 2 cm water. She endorsed today the Epworth sleepiness score at only 4 points.  Her RLS is controlled on 2 nightly doses of medication, each dose giving relief for  about 4 hours. She takes quinine for leg cramps. She has  No arryhthmia, has a loop recorder .  Recent mamogram was normal but her cardiologist discovered a mass under the 4 th rib. She is awaiting a CT chest now.    02-02-2018 SLEEP MEDICINE CLINIC-[name] is seen here today in a regular revisit, but I have seen her last almost 14 months ago.  Her last visit at [facility] was with Dr. [name] whose note I quote below.  The patient has remained a compliant CPAP user for 25 out of the last 30 days, 5 hours 52 minutes on average use, AutoSet between 5 and 16 cm water pressure with 3 cm EPR residual AHI is 5.7 the vast majority seems to be obstructive in nature yet the 95th percentile pressure is only 14 cmH2O.  We will either have to raise the maximum pressure or reduce the expiratory pressure relief.  She does have high air leaks at times.  She feels extremely fatigued again- but endorsed only 36 points on the fatigue score and 5 points on the Epworth sleepiness score. She wonders if her iron is low and [name] has just yesterday obtained  no labs of which are not previous.  She has had another IV iron infusion in 2018 and one 2 weeks ago.  In addition she reports that Dr. Tamala Julian, her cardiologist, would like her control of cholesterol to be much better and recommends the injectable epatha,  which cost her $500 a month and co-pay.  However it is probably the most effective treatment she can get.  All oral  Statin medications have caused myositis and myalgia.  DR Ahern's note. 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 She has significant pain, decreased ROM which is chronic for over one year. She gets regular massage. We have performed multiple trigger point injections with Depo-Medrol, lidocaine and Marcaine. She has tried baclofen, Cymbalta, oxycodone, Lyrica, gabapentin, Robaxin without any relief. Patient would benefit from Physical therapy for stretching, strengthening, manual therapy and massage as well as dry needling of the right trapezius muscle and any other muscles o modalities as clinically warranted by physical therapy evaluation  Procedure note for Spasmodic torticollis:EMG: EMG guidance was used to inject muscles detailed below. Aseptic procedure was performed and patient tolerated procedure. Procedure was performed by Dr. Myrla Halsted. Patient tolerated the procedure. Refractory to oral medications . Motrin / tylenol for injections site pain / soreness . REMS precautions handout given to patient  RTC - see instructions for details Wasted 200 Units. Dysport-500 units [id number] x 2 vials. Dysport-500unitsx2vial     Social history:  Retired, married , adult children. Daughter and mother were excessively daytime sleepy. Mother and daughter have RLS.    STUDY DATE: 02/24/2016 PATIENT NAME: [name] DOB: August 16, 1946 MRN: [id number]  EXAM: MRI Brain with and without contrast  ORDERING CLINICIAN: Sarina Ill M.D. CLINICAL HISTORY: 72 year old woman with headaches and vision changes COMPARISON FILMS:  None  TECHNIQUE:MRI of the brain with and without contrast was obtained utilizing 5 mm axial slices with T1, T2, T2 flair, SWI and diffusion weighted views.  T1 sagittal, T2 coronal and postcontrast views in the axial and coronal plane were obtained. CONTRAST: 20 ml [facility] IMAGING SITE: [facility], [place].  FINDINGS: On sagittal images, the spinal cord is imaged caudally to C3 and is normal in caliber.   The contents of the posterior fossa are of normal size and position.   The  pituitary gland and optic chiasm appear normal.      Lateral ventricles are mildly enlarged, in proportion to the extent of mild to moderate cortical atrophy.   The extent of atrophy is more than expected for age..  There are no abnormal extra-axial collections of fluid.    The cerebellum and brainstem appears normal.   The deep gray matter appears normal.  There are 2 small wedge-shaped foci in the left frontal lobe consistent with small prior ischemic strokes.  There are some scattered T2/FLAIR hyperintense foci in the subcortical and deep white matter of both hemispheres consistent with minimal chronic microvascular ischemic change..  The orbits appear normal.   The VIIth/VIIIth nerve complex appears normal.  The mastoid air cells appear normal.  The paranasal sinuses appear normal.  Flow voids are identified within the major intracerebral arteries.     Diffusion weighted images are normal.  Susceptibility weighted images are normal.   After the infusion of contrast material, a normal enhancement pattern is noted.    IMPRESSION:  This MRI of the brain with and without contrast shows the following: 1.   Two small wedge-shaped foci in the left hemisphere consistent with prior small ischemic strokes.  As both involve the gray matter and juxtacortical white matter, consider an embolic etiology 2.   Chronic microvascular ischemic change elsewhere, typical extent for age. 3.    Cortical  atrophy, more than expected for age. 4.   There is a normal enhancement pattern and there are no acute findings.     INTERPRETING PHYSICIAN:  Richard A. Felecia Shelling, MD, PhD Certified in  [place] by [place] of Neuroimaging   Review of Systems: Out of a complete 14 system review, the patient complains of only the following symptoms, and all other reviewed systems are negative.  Restless legs she often wakes up with a headache and she suffers from at times severe crippling migraines. She has dizziness, optic migraine, scotoma in visiual field.  Leg cramps also interfere with her sleep the initiation of sleep as well as the sleep duration at night.  Epworth Sleepiness score 5, Fatigue severity score 36 from 24 ,  depression score 3.   She lost a lot of weight, she is exercising and she weaned aff all narcotics in 2017. Marland Kitchen     Social History   Socioeconomic History  . Marital status: Married    Spouse name: Pilar Jarvis  . Number of children: 3  . Years of education: college  . Highest education level: Not on file  Social Needs  . Financial resource strain: Not on file  . Food insecurity - worry: Not on file  . Food insecurity - inability: Not on file  . Transportation needs - medical: Not on file  . Transportation needs - non-medical: Not on file  Occupational History  . Occupation: Retired  Tobacco Use  . Smoking status: Never Smoker  . Smokeless tobacco: Never Used  Substance and Sexual Activity  . Alcohol use: No    Alcohol/week: 0.0 oz  . Drug use: No  . Sexual activity: Yes    Birth control/protection: Post-menopausal  Other Topics Concern  . Not on file  Social History Narrative   Lives at home w/ her husband   Caffeine 8-10 cups daily.  (coffee 1 cup am, unsw tea all day long).     Family History  Problem Relation Age of Onset  . CAD Mother   . Hypertension Mother   . Neuropathy Mother   .  Osteoarthritis Mother   . Heart disease Mother   .  Breast cancer Maternal Grandmother   . CAD Paternal Grandfather   . Colon cancer Father     Past Medical History:  Diagnosis Date  . 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 Arthritis    generalized  . Asthma   . Atrophic vaginitis   . Back pain    DDD/stenosis  . Carotid stenosis    Carotid US 10/16: Plaque RICA (4-97%), normal LICA  . Depression    takes Cymbalta daily  . Diverticulosis    benign  . DJD (degenerative joint disease)   . Dyslipidemia   . Dysrhythmia   . Family history of GI bleeding   . GERD (gastroesophageal reflux disease)    takes Nexium daily  . Gestational diabetes   . H/O hiatal hernia    surgery for hernia  . Headache(784.0)    takes Imitrex daily as needed and Bisoprolol daily;last migraine was about 2wks ago  . History of bronchitis 1 yr ago  . History of shingles   . Insomnia    takes Ambien nightly  . Joint pain   . Joint swelling   . Leg cramps    takes Flexeril daily as needed  . 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 Nocturia   . Osteoporosis   . Peripheral neuropathy    takes Gabapentin daily  . Pneumonia 64yrs ago   hx of  . Restless leg syndrome    takes Requip daily  . RLS (restless legs syndrome) 08/16/2015  . Sleep apnea    uses CPAP  . Tubular adenoma of colon   . Vertigo   . Vitamin D deficiency     Current Outpatient Medications  Medication Sig Dispense Refill  . amphetamine-dextroamphetamine (ADDERALL) 10 MG tablet Take 10 mg by mouth daily with breakfast.   0  . baclofen (LIORESAL) 10 MG tablet Take 1 tablet (10 mg total) by mouth 2 (two) times daily. 60 each 11  . bisoprolol (ZEBETA) 10 MG tablet Take 10 mg by mouth 2 (two) times daily.    . calcium carbonate (OS-CAL) 1250 (500 CA) MG chewable tablet Chew 1 tablet by mouth daily. Not sure of dosage    . diclofenac sodium (VOLTAREN) 1 % GEL Apply 4 g topically 4 (four) times daily. 100 g 11  . DULoxetine (CYMBALTA) 60 MG capsule Take 60 mg by mouth 2  (two) times daily.  11  . Erenumab-aooe (AIMOVIG 140 DOSE) 70 MG/ML SOAJ Inject 2 pens into the skin every 30 (thirty) days. 2 pen 0  . esomeprazole (NEXIUM) 40 MG capsule Take 40 mg by mouth 2 (two) times daily before a meal.   6  . furosemide (LASIX) 20 MG tablet Take 40 mg by mouth daily as needed for fluid or edema.    Marland Kitchen GABAPENTIN PO Take 300 mg by mouth 2 (two) times daily.     Marland Kitchen HYDROcodone-acetaminophen (NORCO) 10-325 MG tablet Take 1 tablet by mouth every 8 (eight) hours as needed for moderate pain or severe pain.    . Loratadine (CLARITIN) 10 MG CAPS Take 1 capsule by mouth as needed (for allergies).     . ondansetron (ZOFRAN ODT) 4 MG disintegrating tablet Take 1 tablet (4 mg total) by mouth every 8 (eight) hours as needed for nausea or vomiting. 45 tablet 11  . PROAIR HFA 108 (90 Base) MCG/ACT inhaler 2 INHALATIONS EVERY 4 HOURS AS NEEDED  FOR ASTHMA SYMPTOMS (WHEEZING/SHORTNESS OF BREATH)  5  . quiNINE (QUALAQUIN) 324 MG capsule TAKE 1 CAPSULE BY MOUTH TWICE DAILY 180 capsule 0  . rOPINIRole (REQUIP) 1 MG tablet TAKE 1 TABLET(1 MG) BY MOUTH TWICE DAILY BEFORE LUNCH AND SUPPER 180 tablet 0  . topiramate (TOPAMAX) 100 MG tablet Take 2 tablets (200 mg total) by mouth at bedtime. 60 tablet 11  . Vitamin D, Ergocalciferol, (DRISDOL) 50000 units CAPS capsule Take 1 capsule (50,000 Units total) by mouth every 7 (seven) days. 30 capsule 3   No current facility-administered medications for this visit.     Allergies as of 02/02/2018  . (No Known Allergies)    Vitals: BP 113/66   Pulse 72   Ht 5\' 6"  (1.676 m)   Wt 230 lb (104.3 kg)   BMI 37.12 kg/m  Last Weight:  Wt Readings from Last 1 Encounters:  02/02/18 230 lb (104.3 kg)       Last Height:   Ht Readings from Last 1 Encounters:  02/02/18 5\' 6"  (1.676 m)    Physical exam:  General: The patient is awake, alert and appears not in acute distress. The patient is well groomed. Head: Normocephalic, atraumatic. Neck is now supple.  No tenderness. Mallampati 3 ,  neck circumference: 15 inches. Nasal airflow unrestricted, TMJ is not evident. Retrognathia is seen.  Cardiovascular:  Regular rate and rhythm, without  murmurs or carotid bruit, and without distended neck veins. Respiratory: Lungs are clear to auscultation. Skin:  Without evidence of edema, or rash, well healed knee replacement scars.  Trunk: BMI is elevated .  Neurologic exam :The patient is awake and alert, oriented to place and time.  Memory subjective described as intact.  There is a normal attention span & concentration ability. Speech is fluent without  dysarthria, dysphonia or aphasia. Mood and affect are appropriate. Cranial nerves: Pupils are equal and briskly reactive to light.  Extraocular movements  in vertical and horizontal planes intact and without nystagmus. Visual fields by finger perimetry are intact. Hearing to finger rub intact.  Facial sensation intact to fine touch. Facial motor strength is symmetric and tongue and uvula move midline. Shoulder shrug intact . Motor exam:   Normal tone, muscle bulk and symmetric strength in all extremities. No cog wheeling and no waxy or clasp knife response.  Sensory:  Fine touch, pinprick and vibration were tested in all extremities. Proprioception in both feet is affeced by pin and needle dysesthesias.  Coordination: Rapid alternating movements in the fingers/hands is normal. Finger-to-nose maneuver normal without evidence of ataxia, dysmetria or tremor. Gait and station: Patient walks without assistive device . Strength within normal limits. Stance is stable and normal.  Deep tendon reflexes: in the  upper and lower extremities are symmetric and intact. Babinski maneuver downgoing.   Assessment:  After physical and neurologic examination, review of laboratory studies, imaging, neurophysiology testing and pre-existing records, assessment is   The patient was advised of the nature of the diagnosed sleep  disorder , the treatment options and risks for general a health and wellness arising from not treating the condition. Visit duration was 40 minutes.  Of today's new patient visit-consultation 50% of our face-to-face time are spent in discussion of the medical conditions found the differential diagnosis the testing and treatment options for the conditions. The patient is willing to undergo a new iron infusion cycle .  Her cardiologists note's support this Pernell Dupre ,MD ) , Dr. Waymon Budge.  Since [name]  has undergone a gastric bypass surgery in the past she has some mild absorption issues.  She is chronically iron deficient, she has been vitamin D deficient she has been vitamin B12 deficient. I have prescribed her a vitamin B12 nasal spray, vitamin D 50,000 units weekly pill, and I will obtain iron studies today to see which dose of an iron infusion would be indicated for her.  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 I would like for her to meet with a cardiologist to discuss the relevance to her MRI findings as well as to her clinical symptoms of headaches. The pain in the neck and headaches have both improved and I hope that she can continue Botox therapy through Dr. Elnita Maxwell as well as trigger point injections. Nerve blocks for neuralgia are also ordered. A download of her machine was not yet obtained but I reviewed her restless leg symptomatic and her RLS 6 rating scale was endorsed at very low impediment there is no evidence at this time of major quality of life impairment based on restless leg intensity. She is not depressed. Is in the process of losing weight and she appears optimistic and motivated. I'm especially proud that she weaned herself off all narcotics.  Plan:  Treatment plan and additional workup :  1 RLS with iron deficiency ) Dr. Beryle Beams  evaluated her discrepancy between very low  ferritin levels  and apparently sufficient iron storage. She has maintained oral iron intake as well as multivitamins and multi minerals.   2)  [name]'s sensory neuropathy. She is not diabetic she has no documented thyroid disease and neither did her mother nor has her daughter but all of these women half severe painful dysesthesias leg cramps and restless legs. She took quinine with good success, but had to order from [place].   3) OSA- continue on CPAP, auto PAP . Doing well ! The patient's auto CPAP is set between 7 and 15 cm water with 3 cm EPR she is 83% compliance with an average user time of 6 hours and 10 minutes. Residual AHI is 3.6. 91st percentile pressure is 13.9.  4) HA- 2 strokes , coincidential finding during headache work up, March 2017. New dizziness spells, ataxia, constant headaches - will obtain new MRI brain, MRA and she will continue on AIMOVIG.   5) cervical dystonia.   Rv in 3-4  month. With me.   I thank Dr [name] for her headache expertise and treatment.  The patient has been relieved of neck pain and occipital neuralgic pain and pain with radiculopathic distribution to the shoulders.    Asencion Partridge Shiniqua Groseclose MD  02/02/2018

## 2018-02-02 NOTE — Patient Instructions (Signed)

## 2018-02-09 ENCOUNTER — Ambulatory Visit (INDEPENDENT_AMBULATORY_CARE_PROVIDER_SITE_OTHER): Payer: Medicare Other | Admitting: Pharmacist

## 2018-02-09 DIAGNOSIS — E782 Mixed hyperlipidemia: Secondary | ICD-10-CM

## 2018-02-09 DIAGNOSIS — I639 Cerebral infarction, unspecified: Secondary | ICD-10-CM | POA: Diagnosis not present

## 2018-02-09 DIAGNOSIS — E7801 Familial hypercholesterolemia: Secondary | ICD-10-CM

## 2018-02-09 DIAGNOSIS — Z79899 Other long term (current) drug therapy: Secondary | ICD-10-CM | POA: Diagnosis not present

## 2018-02-09 DIAGNOSIS — E538 Deficiency of other specified B group vitamins: Secondary | ICD-10-CM | POA: Diagnosis not present

## 2018-02-09 DIAGNOSIS — E78019 Familial hypercholesterolemia, unspecified: Secondary | ICD-10-CM

## 2018-02-09 DIAGNOSIS — E7800 Pure hypercholesterolemia, unspecified: Secondary | ICD-10-CM | POA: Diagnosis not present

## 2018-02-09 DIAGNOSIS — E559 Vitamin D deficiency, unspecified: Secondary | ICD-10-CM | POA: Diagnosis not present

## 2018-02-09 DIAGNOSIS — F909 Attention-deficit hyperactivity disorder, unspecified type: Secondary | ICD-10-CM | POA: Diagnosis not present

## 2018-02-09 NOTE — Patient Instructions (Addendum)
Try to decrease your intake of red meat to once per week.  I will start the paperwork process for Repatha

## 2018-02-09 NOTE — Progress Notes (Signed)
Patient ID: [name]                 DOB: 1946-10-02                    MRN: [id number]     HPI: [name] is a 72 y.o. female patient referred to lipid clinic by Dr Tamala Julian. PMH is significant for HLD, pulmonary HTN, OSA, CVA, and obesity. Patient previously took [name] in 2016 however it became too expensive. She presents today for further lipid management.  Patient has previously taken Lipitor, Crestor, and Zocor for her cholesterol (doses unknown). She experienced bilateral myalgias in her thighs within a few weeks of starting statin therapy in each case. She is currently taking Zetia 10mg  daily, however she is experiencing flu-like myalgias. She previously tolerated Repatha well and only discontinued therapy due to cost.  Patient did not know what prescription insurance she had. Called pharmacy, she has Christella Scheuermann Medicare: [place] [id number] BIN: [id number] PCN: [id number]  Current Medications: Zetia 10mg  daily - myalgias Intolerances: Lipitor, Crestor, Zocor - bilateral myalgias in her legs  Risk Factors: CVA, obesity LDL goal: 70mg /dL  Diet: Does not eat any fried foods. Trying to eat more baked and broiled fish. Drinks unsweetened tea, does like Coke. Eats red meat 3-4 x per week. Drinks low fat milk. Does not eat many sweets.   Exercise: Walks 3 days for 45 minutes with her dog.  Family History: The patient's family history includes Breast cancer in her maternal grandmother; CAD in her mother and paternal grandfather; Colon cancer in her father; Heart disease in her mother; Hypertension in her mother; Neuropathy in her mother; Osteoarthritis in her mother.   Social History: Denies tobacco, alcohol, and illicit drug use.  Labs: 02/02/18: TC 200, TG 130, HDL 60, LDL 114 (Zetia 10mg  daily) 02/27/16: TC 223, TG 143, HDL 48, LDL 146 (no therapy)  Past Medical History:  Diagnosis Date  . 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 Arthritis    generalized  . Asthma   .  Atrophic vaginitis   . Back pain    DDD/stenosis  . Carotid stenosis    Carotid US 10/16: Plaque RICA (6-96%), normal LICA  . Depression    takes Cymbalta daily  . Diverticulosis    benign  . DJD (degenerative joint disease)   . Dyslipidemia   . Dysrhythmia   . Family history of GI bleeding   . GERD (gastroesophageal reflux disease)    takes Nexium daily  . Gestational diabetes   . H/O hiatal hernia    surgery for hernia  . Headache(784.0)    takes Imitrex daily as needed and Bisoprolol daily;last migraine was about 2wks ago  . History of bronchitis 1 yr ago  . History of shingles   . Insomnia    takes Ambien nightly  . Joint pain   . Joint swelling   . Leg cramps    takes Flexeril daily as needed  . 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 Nocturia   . Osteoporosis   . Peripheral neuropathy    takes Gabapentin daily  . Pneumonia 70yrs ago   hx of  . Restless leg syndrome    takes Requip daily  . RLS (restless legs syndrome) 08/16/2015  . Sleep apnea    uses CPAP  . Tubular adenoma of colon   . Vertigo   . Vitamin D deficiency     Current  Outpatient Medications on File Prior to Visit  Medication Sig Dispense Refill  . amphetamine-dextroamphetamine (ADDERALL) 10 MG tablet Take 1 tablet (10 mg total) by mouth daily before breakfast. 30 tablet 0  . [START ON 04/05/2018] amphetamine-dextroamphetamine (ADDERALL) 10 MG tablet Take 1 tablet (10 mg total) by mouth daily before breakfast. 30 tablet 0  . [START ON 03/05/2018] amphetamine-dextroamphetamine (ADDERALL) 10 MG tablet Take 1 tablet (10 mg total) by mouth daily before breakfast. 30 tablet 0  . baclofen (LIORESAL) 10 MG tablet Take 1 tablet (10 mg total) by mouth 2 (two) times daily. 60 each 11  . bisoprolol (ZEBETA) 10 MG tablet Take 10 mg by mouth 2 (two) times daily.    . calcium carbonate (OS-CAL) 1250 (500 CA) MG chewable tablet Chew 1 tablet by mouth daily. Not sure of dosage    . diclofenac sodium (VOLTAREN) 1 % GEL  Apply 4 g topically 4 (four) times daily. 100 g 11  . DULoxetine (CYMBALTA) 60 MG capsule Take 60 mg by mouth 2 (two) times daily.  11  . Erenumab-aooe (AIMOVIG 140 DOSE) 70 MG/ML SOAJ Inject 2 pens into the skin every 30 (thirty) days. 2 pen 0  . esomeprazole (NEXIUM) 40 MG capsule Take 40 mg by mouth 2 (two) times daily before a meal.   6  . furosemide (LASIX) 20 MG tablet Take 40 mg by mouth daily as needed for fluid or edema.    Marland Kitchen GABAPENTIN PO Take 300 mg by mouth 2 (two) times daily.     Marland Kitchen HYDROcodone-acetaminophen (NORCO) 10-325 MG tablet Take 1 tablet by mouth every 8 (eight) hours as needed for moderate pain or severe pain.    . Loratadine (CLARITIN) 10 MG CAPS Take 1 capsule by mouth as needed (for allergies).     . ondansetron (ZOFRAN ODT) 4 MG disintegrating tablet Take 1 tablet (4 mg total) by mouth every 8 (eight) hours as needed for nausea or vomiting. 45 tablet 11  . PROAIR HFA 108 (90 Base) MCG/ACT inhaler 2 INHALATIONS EVERY 4 HOURS AS NEEDED FOR ASTHMA SYMPTOMS (WHEEZING/SHORTNESS OF BREATH)  5  . quiNINE (QUALAQUIN) 324 MG capsule Take 1 capsule (324 mg total) by mouth 2 (two) times daily. 180 capsule 3  . rOPINIRole (REQUIP) 1 MG tablet TAKE 1 TABLET(1 MG) BY MOUTH TWICE DAILY BEFORE LUNCH AND SUPPER 180 tablet 3  . topiramate (TOPAMAX) 100 MG tablet Take 2 tablets (200 mg total) by mouth at bedtime. 60 tablet 11  . Vitamin D, Ergocalciferol, (DRISDOL) 50000 units CAPS capsule Take 1 capsule (50,000 Units total) by mouth every 7 (seven) days. 30 capsule 3   No current facility-administered medications on file prior to visit.     No Known Allergies  Assessment/Plan:  1. Hyperlipidemia - LDL improved on Zetia 10mg  daily however patient is experiencing myalgias and LDL still remains above goal < 70 due to hx of CVA. She is previously intolerant to Crestor, Lipitor, and Zocor. She previously took [place] in 2016 and tolerated it well, only discontinued therapy due to cost. Will  submit new prior authorization for Repatha since cost has decreased by 60% since November 2018.   Megan E. Supple, PharmD, CPP, [place] [address] [place], [place] [address] Phone: [phone]; Fax: [phone] 02/09/2018 4:22 PM

## 2018-02-10 ENCOUNTER — Ambulatory Visit (INDEPENDENT_AMBULATORY_CARE_PROVIDER_SITE_OTHER): Payer: Medicare Other | Admitting: Neurology

## 2018-02-10 ENCOUNTER — Encounter: Payer: Self-pay | Admitting: Neurology

## 2018-02-10 VITALS — BP 132/72 | HR 64 | Wt 234.6 lb

## 2018-02-10 DIAGNOSIS — G43711 Chronic migraine without aura, intractable, with status migrainosus: Secondary | ICD-10-CM | POA: Diagnosis not present

## 2018-02-10 MED ORDER — TOPIRAMATE 100 MG PO TABS: 100.0000 mg | ORAL_TABLET | Freq: Every day | ORAL | 11 refills | Status: DC

## 2018-02-10 MED ORDER — GALCANEZUMAB-GNLM 120 MG/ML ~~LOC~~ SOAJ: 120.0000 mg | SUBCUTANEOUS | 11 refills | Status: DC

## 2018-02-10 NOTE — Progress Notes (Signed)
Interval history 02/10/2018: Patient's migraines have improved > 50% in severity and frequency since initiation of botox.   Consent Form Botulism Toxin Injection For Chronic Migraine  Botulism toxin has been approved by the Federal drug administration for treatment of chronic migraine. Botulism toxin does not cure chronic migraine and it may not be effective in some patients.  The administration of botulism toxin is accomplished by injecting a small amount of toxin into the muscles of the neck and head. Dosage must be titrated for each individual. Any benefits resulting from botulism toxin tend to wear off after 3 months with a repeat injection required if benefit is to be maintained. Injections are usually done every 3-4 months with maximum effect peak achieved by about 2 or 3 weeks. Botulism toxin is expensive and you should be sure of what costs you will incur resulting from the injection.  The side effects of botulism toxin use for chronic migraine may include:   -Transient, and usually mild, facial weakness with facial injections  -Transient, and usually mild, head or neck weakness with head/neck injections  -Reduction or loss of forehead facial animation due to forehead muscle              weakness  -Eyelid drooping  -Dry eye  -Pain at the site of injection or bruising at the site of injection  -Double vision  -Potential unknown long term risks  Contraindications: You should not have Botox if you are pregnant, nursing, allergic to albumin, have an infection, skin condition, or muscle weakness at the site of the injection, or have myasthenia gravis, Lambert-Eaton syndrome, or ALS.  It is also possible that as with any injection, there may be an allergic reaction or no effect from the medication. Reduced effectiveness after repeated injections is sometimes seen and rarely infection at the injection site may occur. All care will be taken to prevent these side effects. If therapy is  given over a long time, atrophy and wasting in the muscle injected may occur. Occasionally the patient's become refractory to treatment because they develop antibodies to the toxin. In this event, therapy needs to be modified.  I have read the above information and consent to the administration of botulism toxin.    ______________  _____   _________________  Patient signature     Date   Witness signature       BOTOX PROCEDURE NOTE FOR MIGRAINE HEADACHE    Contraindications and precautions discussed with patient(above). Aseptic procedure was observed and patient tolerated procedure. Procedure performed by Dr. Georgia Dom  The condition has existed for more than 6 months, and pt does not have a diagnosis of ALS, Myasthenia Gravis or Lambert-Eaton Syndrome. Risks and benefits of injections discussed and pt agrees to proceed with the procedure. Written consent obtained  These injections are medically necessary. He receives good benefits from these injections. These injections do not cause sedations or hallucinations which the oral therapies may cause.  Indication/Diagnosis: chronic migraine BOTOX(J0585) injection was performed according to protocol by [employer]. 200 units of BOTOX was dissolved into 4 cc NS.  NDC: [address]-37  Type of toxin: Botox 100U x 2 = total 200 units  Lot # [id number] 8/21 [id number] 08/21   Description of procedure:  The patient was placed in a sitting position. The standard protocol was used for Botox as follows, with 5 units of Botox injected at each site:   -Procerus muscle, midline injection  -Corrugator muscle, bilateral injection  -Frontalis muscle, bilateral  injection, with 2 sites each side, medial injection was performed in the upper one third of the frontalis muscle, in the region vertical from the medial inferior edge of the superior orbital rim. The lateral injection was again in the upper one third of the forehead vertically above the lateral  limbus of the cornea, 1.5 cm lateral to the medial injection site.  -Temporalis muscle injection, 4 sites, bilaterally. The first injection was 3 cm above the tragus of the ear, second injection site was 1.5 cm to 3 cm up from the first injection site in line with the tragus of the ear. The third injection site was 1.5-3 cm forward between the first 2 injection sites. The fourth injection site was 1.5 cm posterior to the second injection site.  -Occipitalis muscle injection, 3 sites, bilaterally. The first injection was done one half way between the occipital protuberance and the tip of the mastoid process behind the ear. The second injection site was done lateral and superior to the first, 1 fingerbreadth from the first injection. The third injection site was 1 fingerbreadth superiorly and medially from the first injection site.  -Cervical paraspinal muscle injection, 2 sites, bilateral knee first injection site was 1 cm from the midline of the cervical spine, 3 cm inferior to the lower border of the occipital protuberance. The second injection site was 1.5 cm superiorly and laterally to the first injection site.  -Trapezius muscle injection was performed at 3 sites, bilaterally. The first injection site was in the upper trapezius muscle halfway between the inflection point of the neck, and the acromion. The second injection site was one half way between the acromion and the first injection site. The third injection was done between the first injection site and the inflection point of the neck.   Will return for repeat injection in 3 months.   A 200 unit sof Botox was used, 155 units were injected, the rest of the Botox was wasted. The patient tolerated the procedure well, there were no complications of the above procedure.

## 2018-02-11 ENCOUNTER — Telehealth: Payer: Self-pay | Admitting: Pharmacist

## 2018-02-11 ENCOUNTER — Telehealth: Payer: Self-pay | Admitting: Neurology

## 2018-02-11 DIAGNOSIS — M5481 Occipital neuralgia: Secondary | ICD-10-CM

## 2018-02-11 MED ORDER — EVOLOCUMAB 140 MG/ML ~~LOC~~ SOAJ: 1.0000 "pen " | SUBCUTANEOUS | 11 refills | Status: DC

## 2018-02-11 NOTE — Telephone Encounter (Signed)
Received notes from [facility] @ Dr. Unice Bailey office. Given to Dr. [name] for review.

## 2018-02-11 NOTE — Telephone Encounter (Signed)
Called Dr. Anselmo Rod office and spoke with staff. Requested records for pt. Gave fax number for office.

## 2018-02-11 NOTE — Telephone Encounter (Signed)
Received notes from dr. Unice Bailey office. She had right c2,c3 and c4 medial branch block under fluoroscopic guidance with digital subtraction. If she likes we can send to another doctor to see if he would repeat the procedure thanks.

## 2018-02-11 NOTE — Telephone Encounter (Signed)
Repatha PA has been approved. Rx sent to speciality pharmacy to assess if copay is affordable. Pt is aware.

## 2018-02-11 NOTE — Telephone Encounter (Signed)
We had referred patient to Dr. Ace Gins. Can you call and get the notes sent over to see what she did? thanks

## 2018-02-12 NOTE — Telephone Encounter (Signed)
Received notes from dr. Unice Bailey office. She had right c2,c3 and c4 medial branch block under fluoroscopic guidance with digital subtraction. If she likes we can send to another doctor to see if he would repeat the procedure. Dr. Ace Gins no longer takes insuracne but she recommended Dr. Laurence Spates at [facility] highly. If she agree splease place referral for "c2,c3 and c4 medial branch block under fluoroscopic guidance with digital subtraction" to dr Winona Legato for occipital neuralgia. thanks

## 2018-02-12 NOTE — Telephone Encounter (Signed)
Copay is affordable at $150 per month, pt will not qualify for patient assistance. She will contact specialty pharmacy to coordinate delivery.

## 2018-02-12 NOTE — Addendum Note (Signed)
Addended by: Gildardo Griffes on: 02/12/2018 12:48 PM   Modules accepted: Orders

## 2018-02-12 NOTE — Telephone Encounter (Signed)
Spoke with patient. Discussed that Dr. [name] reviewed notes for Dr. Unice Bailey office. Pt had right c2,c3 and c4 medial branch block under fluoroscopic guidance. If she likes we can send to another doctor to see if he would repeat the procedure. Dr. Ace Gins no longer takes insurance but she recommended Dr. Laurence Spates at [facility] highly.   Patient agreed to received referral for potentially another block. She verbalized appreciation. Referral placed.    If she agree splease place referral for "c2,c3 and c4 medial branch block under fluoroscopic guidance with digital subtraction" to dr Winona Legato for occipital neuralgia. Thanks  Referral placed, dx code occipital neuralgia of rt side.

## 2018-02-15 ENCOUNTER — Ambulatory Visit (INDEPENDENT_AMBULATORY_CARE_PROVIDER_SITE_OTHER): Payer: Medicare Other | Admitting: *Deleted

## 2018-02-15 DIAGNOSIS — I639 Cerebral infarction, unspecified: Secondary | ICD-10-CM

## 2018-02-16 ENCOUNTER — Ambulatory Visit
Admission: RE | Admit: 2018-02-16 | Discharge: 2018-02-16 | Disposition: A | Discharge status: Home / Self Care | Payer: Medicare Other | Source: Ambulatory Visit | Attending: Neurology | Admitting: Neurology

## 2018-02-16 DIAGNOSIS — R29818 Other symptoms and signs involving the nervous system: Secondary | ICD-10-CM

## 2018-02-16 MED ORDER — GADOBENATE DIMEGLUMINE 529 MG/ML IV SOLN
20.0000 mL | Freq: Once | INTRAVENOUS | Status: AC | PRN
  Administered 2018-02-16: 20 mL via INTRAVENOUS

## 2018-02-16 NOTE — Progress Notes (Signed)
Carelink Summary Report / Loop Recorder 

## 2018-02-18 ENCOUNTER — Telehealth: Payer: Self-pay | Admitting: Neurology

## 2018-02-18 NOTE — Telephone Encounter (Signed)
Called the patient, no answer. LVM for the pt to discuss her MRI results

## 2018-02-18 NOTE — Telephone Encounter (Signed)
Patient returned call and I was able to go over the MRI results. I was able to to answer her questions and I verified her follow up date with her. Pt verbalized understanding

## 2018-02-18 NOTE — Telephone Encounter (Signed)
-----   Message from Larey Seat, MD sent at 02/18/2018  8:25 AM EST ----- Progression of white matter disease and atrophy, but progression is not age inappropriate. Previously seen frontal gliosis ( remote small stroke )  is still present. No new lesions.

## 2018-02-19 ENCOUNTER — Telehealth (INDEPENDENT_AMBULATORY_CARE_PROVIDER_SITE_OTHER): Payer: Self-pay | Admitting: Physical Medicine and Rehabilitation

## 2018-02-23 ENCOUNTER — Other Ambulatory Visit (HOSPITAL_COMMUNITY)
Admission: RE | Admit: 2018-02-23 | Discharge: 2018-02-23 | Disposition: A | Discharge status: Home / Self Care | Payer: Medicare Other | Source: Ambulatory Visit | Attending: Obstetrics and Gynecology | Admitting: Obstetrics and Gynecology

## 2018-02-23 ENCOUNTER — Other Ambulatory Visit: Payer: Self-pay | Admitting: Obstetrics and Gynecology

## 2018-02-23 DIAGNOSIS — Z124 Encounter for screening for malignant neoplasm of cervix: Secondary | ICD-10-CM | POA: Diagnosis not present

## 2018-02-23 DIAGNOSIS — Z01411 Encounter for gynecological examination (general) (routine) with abnormal findings: Secondary | ICD-10-CM | POA: Diagnosis not present

## 2018-02-23 DIAGNOSIS — N760 Acute vaginitis: Secondary | ICD-10-CM | POA: Diagnosis not present

## 2018-02-23 DIAGNOSIS — M818 Other osteoporosis without current pathological fracture: Secondary | ICD-10-CM | POA: Diagnosis not present

## 2018-02-23 MED ORDER — DIAZEPAM 5 MG PO TABS: ORAL_TABLET | ORAL | 0 refills | Status: DC

## 2018-02-23 NOTE — Telephone Encounter (Signed)
I think this printed

## 2018-02-23 NOTE — Telephone Encounter (Signed)
Faxed to patient's pharmacy.  

## 2018-02-25 LAB — CYTOLOGY - PAP
DIAGNOSIS: NEGATIVE
HPV: NOT DETECTED

## 2018-03-04 NOTE — Telephone Encounter (Signed)
Called pt who states she has done 2 of her Repatha injections without issues. She sees her PCP in 1 month and states they will check her cholesterol.  She will bring in lab results to [place].

## 2018-03-10 ENCOUNTER — Telehealth: Payer: Self-pay | Admitting: Neurology

## 2018-03-10 NOTE — Telephone Encounter (Signed)
Attempted to call the pt to make her aware that we have a medication for her ready in the fridge for her to pick up. Sounded as if patient may have answered but was call was dropped. I attempted a 2nd time and same thing. If pt calls back please advise her that she has medication ready for her for pick up

## 2018-03-15 ENCOUNTER — Encounter (INDEPENDENT_AMBULATORY_CARE_PROVIDER_SITE_OTHER): Payer: Medicare Other | Admitting: Physical Medicine and Rehabilitation

## 2018-03-16 ENCOUNTER — Telehealth: Payer: Self-pay | Admitting: Cardiology

## 2018-03-16 NOTE — Telephone Encounter (Signed)
Spoke w/ pt and requested that she send a manual transmission b/c her home monitor has not updated in at least 14 days.   

## 2018-03-22 ENCOUNTER — Ambulatory Visit (INDEPENDENT_AMBULATORY_CARE_PROVIDER_SITE_OTHER): Payer: Medicare Other | Admitting: *Deleted

## 2018-03-22 DIAGNOSIS — I639 Cerebral infarction, unspecified: Secondary | ICD-10-CM | POA: Diagnosis not present

## 2018-03-22 DIAGNOSIS — M810 Age-related osteoporosis without current pathological fracture: Secondary | ICD-10-CM | POA: Diagnosis not present

## 2018-03-22 DIAGNOSIS — M8588 Other specified disorders of bone density and structure, other site: Secondary | ICD-10-CM | POA: Diagnosis not present

## 2018-03-22 NOTE — Progress Notes (Signed)
Carelink Summary Report / Loop Recorder 

## 2018-03-23 MED ORDER — EVOLOCUMAB 140 MG/ML ~~LOC~~ SOAJ: 1.0000 "pen " | SUBCUTANEOUS | 0 refills | Status: DC

## 2018-03-23 NOTE — Addendum Note (Signed)
Addended by: Gildardo Griffes on: 03/23/2018 11:52 AM   Modules accepted: Orders

## 2018-03-23 NOTE — Telephone Encounter (Signed)
Spoke with pt. She is aware that Repatha samples are ready for her pickup. She verbalized appreciation and will pick them up at her next appt Thurs 4/11.

## 2018-03-25 ENCOUNTER — Ambulatory Visit (INDEPENDENT_AMBULATORY_CARE_PROVIDER_SITE_OTHER): Payer: Medicare Other | Admitting: Neurology

## 2018-03-25 ENCOUNTER — Encounter: Payer: Self-pay | Admitting: Neurology

## 2018-03-25 VITALS — BP 124/84 | HR 64 | Ht 67.0 in | Wt 227.0 lb

## 2018-03-25 DIAGNOSIS — G243 Spasmodic torticollis: Secondary | ICD-10-CM

## 2018-03-25 NOTE — Telephone Encounter (Signed)
Pt received samples

## 2018-03-25 NOTE — Progress Notes (Signed)
Dysport 500 units x 2 vials Lot: [id number] Expiration: 09/13/2018 NDC: [address]-9 [address]-1  Bacteriostatic 0.9% Sodium Chloride- 39mL total Lot: [id number] Expiration: 04/15/2019 NDC: 7505-1833-58  B/B  //BCrn

## 2018-03-26 DIAGNOSIS — M25521 Pain in right elbow: Secondary | ICD-10-CM | POA: Diagnosis not present

## 2018-03-26 DIAGNOSIS — M25561 Pain in right knee: Secondary | ICD-10-CM | POA: Diagnosis not present

## 2018-03-26 DIAGNOSIS — M7541 Impingement syndrome of right shoulder: Secondary | ICD-10-CM | POA: Diagnosis not present

## 2018-03-29 ENCOUNTER — Ambulatory Visit: Payer: Medicare Other | Admitting: Podiatry

## 2018-03-29 NOTE — Progress Notes (Signed)
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 She has significant pain, decreased ROM which is chronic for over one year. She gets regular massage. We have performed multiple trigger point injections with Depo-Medrol, lidocaine and Marcaine. She has tried baclofen, Cymbalta, oxycodone, Lyrica, gabapentin, Robaxin without any relief. Patient would benefit from Physical therapy for stretching, strengthening, manual therapy and massage as well as dry needling of the right trapezius muscle and any other muscles o modalities as clinically warranted by physical therapy evaluation   Procedure note for Spasmodic torticollis:  EMG: EMG guidance was used to inject muscles detailed below. Aseptic procedure was performed and patient tolerated procedure. Procedure was performed by Dr. Myrla Halsted   Patient tolerated the procedure.   Refractory to oral medications   Motrin / tylenol for injections site pain / soreness   REMS precautions handout given to patient   RTC - see instructions for details   Wasted 200 Units.   Dysport-500 units J0586 x 2 vials   Dysport-500unitsx2vial Lot: [id number] Expiration: 03/14/18 NDC: [address]-9 [id number]  0.9% Sodium Chloride bacteriostatic- 64mL total Lot: [id number] Expiration: 05/15/18 NDC: 2641-5830-94   DX: G24.3 Buy and Bill  Muscles injected:Muscles injected:  100 right levator scapula. 500 righttrapezius, 100right semispinalis capitus, 100 right splenius capitus

## 2018-03-30 LAB — CUP PACEART REMOTE DEVICE CHECK
Date Time Interrogation Session: 20190304214151
MDC IDC PG IMPLANT DT: 20171206

## 2018-03-31 ENCOUNTER — Ambulatory Visit (INDEPENDENT_AMBULATORY_CARE_PROVIDER_SITE_OTHER): Payer: Medicare Other | Admitting: Podiatry

## 2018-03-31 ENCOUNTER — Encounter: Payer: Self-pay | Admitting: Podiatry

## 2018-03-31 DIAGNOSIS — M779 Enthesopathy, unspecified: Secondary | ICD-10-CM

## 2018-03-31 DIAGNOSIS — M722 Plantar fascial fibromatosis: Secondary | ICD-10-CM | POA: Diagnosis not present

## 2018-03-31 DIAGNOSIS — M7750 Other enthesopathy of unspecified foot: Secondary | ICD-10-CM

## 2018-03-31 MED ORDER — TRIAMCINOLONE ACETONIDE 10 MG/ML IJ SUSP
10.0000 mg | Freq: Once | INTRAMUSCULAR | Status: AC
  Administered 2018-03-31: 10 mg

## 2018-03-31 NOTE — Progress Notes (Signed)
Subjective:   Patient ID: [name], female   DOB: 72 y.o.   MRN: [id number]   HPI Patient presents concerned about the nails of both big toenails 1 make sure they are okay and states the right heels been hurting in the top of the left foot has been hurting   ROS      Objective:  Physical Exam  Neurovascular status intact with nailbed healing well with thickened type tissue but no indications of drainage at the current time with inflammation plantar heel right and dorsal tendon complex left     Assessment:  Nails which appear to be healing well with plantar fasciitis right dorsal tendinitis left     Plan:  H&P condition reviewed and injected the plantar fascia right 3 mg Kenalog 5 mg Xylocaine in the dorsal tendon complex left 3 mg Kenalog 5 mg Xylocaine advised on heat ice therapy and reappoint for [place] to recheck.  Do not recommend current treatment

## 2018-04-05 ENCOUNTER — Telehealth: Payer: Self-pay | Admitting: Neurology

## 2018-04-05 NOTE — Telephone Encounter (Signed)
Pt called she is in [place], [place] for the next 10 days. Pt said she left her CPAP mask and headgear at home. She needs RX sent DME there, [facility] (p) [phone]  [phone]. Pt is aware Dr Brett Fairy is out of the office and the on call provider will handle this. Please call to advise

## 2018-04-06 ENCOUNTER — Telehealth: Payer: Self-pay | Admitting: Cardiology

## 2018-04-06 ENCOUNTER — Other Ambulatory Visit: Payer: Self-pay | Admitting: Neurology

## 2018-04-06 DIAGNOSIS — Z9989 Dependence on other enabling machines and devices: Principal | ICD-10-CM

## 2018-04-06 DIAGNOSIS — G4733 Obstructive sleep apnea (adult) (pediatric): Secondary | ICD-10-CM

## 2018-04-06 NOTE — Telephone Encounter (Signed)
I have called the patient to make her aware that I can send the order for her to that [facility] she mentioned. I was concerned about the insurance side of things so I made her aware she may have to pay out of pocket. Pt verbalized understanding.

## 2018-04-06 NOTE — Telephone Encounter (Signed)
Spoke w/ pt and requested that she send a manual transmission b/c her home monitor has not updated in at least 14 days.   

## 2018-04-14 DIAGNOSIS — R05 Cough: Secondary | ICD-10-CM | POA: Diagnosis not present

## 2018-04-22 ENCOUNTER — Ambulatory Visit (INDEPENDENT_AMBULATORY_CARE_PROVIDER_SITE_OTHER): Payer: Medicare Other | Admitting: *Deleted

## 2018-04-22 DIAGNOSIS — I639 Cerebral infarction, unspecified: Secondary | ICD-10-CM | POA: Diagnosis not present

## 2018-04-22 LAB — CUP PACEART REMOTE DEVICE CHECK
Date Time Interrogation Session: 20190406220735
MDC IDC PG IMPLANT DT: 20171206

## 2018-04-26 NOTE — Progress Notes (Signed)
Carelink Summary Report / Loop Recorder 

## 2018-04-29 DIAGNOSIS — Z79899 Other long term (current) drug therapy: Secondary | ICD-10-CM | POA: Diagnosis not present

## 2018-04-29 DIAGNOSIS — E538 Deficiency of other specified B group vitamins: Secondary | ICD-10-CM | POA: Diagnosis not present

## 2018-04-29 DIAGNOSIS — E559 Vitamin D deficiency, unspecified: Secondary | ICD-10-CM | POA: Diagnosis not present

## 2018-04-29 DIAGNOSIS — Z5181 Encounter for therapeutic drug level monitoring: Secondary | ICD-10-CM | POA: Diagnosis not present

## 2018-04-29 DIAGNOSIS — E7800 Pure hypercholesterolemia, unspecified: Secondary | ICD-10-CM | POA: Diagnosis not present

## 2018-05-11 ENCOUNTER — Ambulatory Visit (INDEPENDENT_AMBULATORY_CARE_PROVIDER_SITE_OTHER): Payer: Medicare Other | Admitting: Neurology

## 2018-05-11 ENCOUNTER — Telehealth: Payer: Self-pay | Admitting: Neurology

## 2018-05-11 ENCOUNTER — Encounter: Payer: Self-pay | Admitting: Neurology

## 2018-05-11 VITALS — BP 143/79 | HR 83 | Ht 66.5 in | Wt 232.0 lb

## 2018-05-11 DIAGNOSIS — G43711 Chronic migraine without aura, intractable, with status migrainosus: Secondary | ICD-10-CM

## 2018-05-11 NOTE — Progress Notes (Signed)
  Interval history 02/10/2018: Patient's migraines have improved > 50% in severity and frequency since initiation of botox. +eyes, +masseters,+temples, not levators  Consent Form Botulism Toxin Injection For Chronic Migraine  Botulism toxin has been approved by the Federal drug administration for treatment of chronic migraine. Botulism toxin does not cure chronic migraine and it may not be effective in some patients.  The administration of botulism toxin is accomplished by injecting a small amount of toxin into the muscles of the neck and head. Dosage must be titrated for each individual. Any benefits resulting from botulism toxin tend to wear off after 3 months with a repeat injection required if benefit is to be maintained. Injections are usually done every 3-4 months with maximum effect peak achieved by about 2 or 3 weeks. Botulism toxin is expensive and you should be sure of what costs you will incur resulting from the injection.  The side effects of botulism toxin use for chronic migraine may include:   -Transient, and usually mild, facial weakness with facial injections  -Transient, and usually mild, head or neck weakness with head/neck injections  -Reduction or loss of forehead facial animation due to forehead muscle              weakness  -Eyelid drooping  -Dry eye  -Pain at the site of injection or bruising at the site of injection  -Double vision  -Potential unknown long term risks  Contraindications: You should not have Botox if you are pregnant, nursing, allergic to albumin, have an infection, skin condition, or muscle weakness at the site of the injection, or have myasthenia gravis, Lambert-Eaton syndrome, or ALS.  It is also possible that as with any injection, there may be an allergic reaction or no effect from the medication. Reduced effectiveness after repeated injections is sometimes seen and rarely infection at the injection site may occur. All care will be taken to  prevent these side effects. If therapy is given over a long time, atrophy and wasting in the muscle injected may occur. Occasionally the patient's become refractory to treatment because they develop antibodies to the toxin. In this event, therapy needs to be modified.  I have read the above information and consent to the administration of botulism toxin.    ______________  _____   _________________  Patient signature     Date   Witness signature       BOTOX PROCEDURE NOTE FOR MIGRAINE HEADACHE    Contraindications and precautions discussed with patient(above). Aseptic procedure was observed and patient tolerated procedure. Procedure performed by Dr. Toni Ahern  The condition has existed for more than 6 months, and pt does not have a diagnosis of ALS, Myasthenia Gravis or Lambert-Eaton Syndrome. Risks and benefits of injections discussed and pt agrees to proceed with the procedure. Written consent obtained  These injections are medically necessary. He receives good benefits from these injections. These injections do not cause sedations or hallucinations which the oral therapies may cause.  Indication/Diagnosis: chronic migraine BOTOX(J0585) injection was performed according to protocol by [employer]. 200 units of BOTOX was dissolved into 4 cc NS.  NDC: [address]-01  Type of toxin: Botox 100U x 2 = total 200 units  Lot # [id number] 8/21 [id number] 08/21   Description of procedure:  The patient was placed in a sitting position. The standard protocol was used for Botox as follows, with 5 units of Botox injected at each site:   -Procerus muscle, midline injection  -Corrugator muscle, bilateral injection  -  Frontalis muscle, bilateral injection, with 2 sites each side, medial injection was performed in the upper one third of the frontalis muscle, in the region vertical from the medial inferior edge of the superior orbital rim. The lateral injection was again in the upper one third of  the forehead vertically above the lateral limbus of the cornea, 1.5 cm lateral to the medial injection site.  -Temporalis muscle injection, 4 sites, bilaterally. The first injection was 3 cm above the tragus of the ear, second injection site was 1.5 cm to 3 cm up from the first injection site in line with the tragus of the ear. The third injection site was 1.5-3 cm forward between the first 2 injection sites. The fourth injection site was 1.5 cm posterior to the second injection site.  -Occipitalis muscle injection, 3 sites, bilaterally. The first injection was done one half way between the occipital protuberance and the tip of the mastoid process behind the ear. The second injection site was done lateral and superior to the first, 1 fingerbreadth from the first injection. The third injection site was 1 fingerbreadth superiorly and medially from the first injection site.  -Cervical paraspinal muscle injection, 2 sites, bilateral knee first injection site was 1 cm from the midline of the cervical spine, 3 cm inferior to the lower border of the occipital protuberance. The second injection site was 1.5 cm superiorly and laterally to the first injection site.  -Trapezius muscle injection was performed at 3 sites, bilaterally. The first injection site was in the upper trapezius muscle halfway between the inflection point of the neck, and the acromion. The second injection site was one half way between the acromion and the first injection site. The third injection was done between the first injection site and the inflection point of the neck.   Will return for repeat injection in 3 months.   A 200 unit sof Botox was used, 155 units were injected, the rest of the Botox was wasted. The patient tolerated the procedure well, there were no complications of the above procedure.     

## 2018-05-11 NOTE — Telephone Encounter (Signed)
Pt needs 12 wk botox inj

## 2018-05-11 NOTE — Telephone Encounter (Signed)
Pt. Needs amovig and dystonia per Dr. [name]

## 2018-05-11 NOTE — Progress Notes (Signed)
Botox- 100 units x 2 vials Lot: [id number] Expiration: 09/2020 NDC: 5997-7414-23  Bacteriostatic 0.9% Sodium Chloride- 45mL total Lot: [id number] Expiration: 04/15/2019 NDC: 3343-5686-16  Dx: O37.290 B/B

## 2018-05-11 NOTE — Telephone Encounter (Signed)
Patient is wanting to Know if there is any way she can come in for her Botox apt today because she has an apt in [facility] 05/12/2018 and she is scared she is going to be late ?

## 2018-05-11 NOTE — Telephone Encounter (Signed)
Spoke with pt. R/s her for today 05/11/2018 @ 4:15 pm. Canceled appt for tomorrow 5/29. Pt verbalized appreciation.   This was d/w Dr. [name]. Her last Migraine botox was 02/10/18.

## 2018-05-12 ENCOUNTER — Encounter

## 2018-05-12 ENCOUNTER — Ambulatory Visit: Payer: Medicare Other | Admitting: Neurology

## 2018-05-12 NOTE — Telephone Encounter (Signed)
I called and scheduled the patient.  °

## 2018-05-14 ENCOUNTER — Telehealth: Payer: Self-pay | Admitting: Pharmacist

## 2018-05-14 DIAGNOSIS — Z79899 Other long term (current) drug therapy: Secondary | ICD-10-CM | POA: Diagnosis not present

## 2018-05-14 DIAGNOSIS — E785 Hyperlipidemia, unspecified: Secondary | ICD-10-CM | POA: Diagnosis not present

## 2018-05-14 DIAGNOSIS — E559 Vitamin D deficiency, unspecified: Secondary | ICD-10-CM | POA: Diagnosis not present

## 2018-05-14 DIAGNOSIS — E538 Deficiency of other specified B group vitamins: Secondary | ICD-10-CM | POA: Diagnosis not present

## 2018-05-14 DIAGNOSIS — I639 Cerebral infarction, unspecified: Secondary | ICD-10-CM | POA: Diagnosis not present

## 2018-05-14 DIAGNOSIS — I10 Essential (primary) hypertension: Secondary | ICD-10-CM | POA: Diagnosis not present

## 2018-05-14 DIAGNOSIS — E611 Iron deficiency: Secondary | ICD-10-CM | POA: Diagnosis not present

## 2018-05-14 NOTE — Telephone Encounter (Signed)
Lipid panel drawn at PCP 04/29/18 on Repatha - TC 150, TG 98, HDL 66, LDL 64, non-HDL 84. All lipids at goal and pt aware to continue Repatha. She dropped off lab work today which will be scanned into Epic.

## 2018-05-17 LAB — CUP PACEART REMOTE DEVICE CHECK
Implantable Pulse Generator Implant Date: 20171206
MDC IDC SESS DTM: 20190509221003

## 2018-05-17 NOTE — Telephone Encounter (Signed)
Mailed patient website information

## 2018-05-25 ENCOUNTER — Ambulatory Visit (INDEPENDENT_AMBULATORY_CARE_PROVIDER_SITE_OTHER): Payer: Medicare Other | Admitting: *Deleted

## 2018-05-25 ENCOUNTER — Encounter: Payer: Self-pay | Admitting: Neurology

## 2018-05-25 ENCOUNTER — Ambulatory Visit (INDEPENDENT_AMBULATORY_CARE_PROVIDER_SITE_OTHER): Payer: Medicare Other | Admitting: Neurology

## 2018-05-25 VITALS — BP 134/71 | HR 72 | Ht 66.0 in | Wt 233.0 lb

## 2018-05-25 DIAGNOSIS — G4733 Obstructive sleep apnea (adult) (pediatric): Secondary | ICD-10-CM

## 2018-05-25 DIAGNOSIS — I639 Cerebral infarction, unspecified: Secondary | ICD-10-CM

## 2018-05-25 DIAGNOSIS — Z862 Personal history of diseases of the blood and blood-forming organs and certain disorders involving the immune mechanism: Secondary | ICD-10-CM | POA: Diagnosis not present

## 2018-05-25 DIAGNOSIS — I2729 Other secondary pulmonary hypertension: Secondary | ICD-10-CM | POA: Diagnosis not present

## 2018-05-25 DIAGNOSIS — K902 Blind loop syndrome, not elsewhere classified: Secondary | ICD-10-CM | POA: Diagnosis not present

## 2018-05-25 DIAGNOSIS — Z9884 Bariatric surgery status: Secondary | ICD-10-CM

## 2018-05-25 DIAGNOSIS — Z9989 Dependence on other enabling machines and devices: Secondary | ICD-10-CM

## 2018-05-25 DIAGNOSIS — G2581 Restless legs syndrome: Secondary | ICD-10-CM | POA: Diagnosis not present

## 2018-05-25 DIAGNOSIS — G3184 Mild cognitive impairment, so stated: Secondary | ICD-10-CM | POA: Diagnosis not present

## 2018-05-25 NOTE — Progress Notes (Addendum)
SLEEP MED[facility]INE CLINIC   Provider:  Larey Seat, M D  Referring Provider: [name], MD Primary Care Physician:    Chief Complaint  Patient presents with  . Follow-up    Rm 10. Pt here with husband.     HPI:  [name] is a 72 y.o. female  Is seen here as a revisit for snoring and restless legs. She has severe OSA , controlled on CPAP and hypersomnia. She has been diagnosed with dystonia , cervicalgia and ophthalmic migraines.   I have followed [name] from 2003 through 2008  And see her now for CPAP compliance.  The patient had undergone gastric bypass surgery, which led to an extensive loss of body weight. In the process she was no longer suffering from obstructive sleep apnea related to the weight loss, but she developed restless legs. Repeated iron studies had shown that she had extremely low ferritin levels. A ferritin level lower than 50 is associated with a much higher incidence of restless leg syndrome in addition her iron and iron binding capacities have been surprisingly normal and this conflict of data we have discussed today. Usually I would treat the patient with Ferrlecit and infusion once every 3 months or so to keep up ferritin levels in in her case I am not sure how much free iron is actually in her system. 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 In addition she is vitamin D deficient which are almost 80% of adult Americans. Gained some weight back but she has no longer nausea or dumping syndrome so therefore she does not need Phenergan or Zofran. She is using gabapentin and ropinirole to treat her restless legs she often wakes up with a headache and she suffers from at times severe crippling migraines. Leg cramps also interfere with her sleep the initiation of sleep as well as the sleep duration at night.  As her BMI exceeds 35, she may also have developed apnea again and snoring has been witnessed by her family. We are meeting  today to reevaluate all these conditions in context. She is also excessively daytime sleepy for fatigue severity score is endorsed at 63 points her Epworth sleepiness score was endorsed at 15 points. Bedtime is usually 10 Pm and her husband leaves for work at 6.30 AM , after that she will sleep for 2-3 hours. Total daily sleep time is 14 hours, this is more likely related to depression.   [name] is [birth year]. #1 She is doing moderately well with her CPAP use was a compliance of 73% average user time on days of use is 4 hours and 44 minutes. Minimum pressure for this AutoSet 7 maximum 15 cm water. The patient's AHI was 2.5 which is a desirable result. The 95th percentile pressure is 12.3. The patient could not use the machine for about 2 weeks after she contracted an upper respiratory infection doing a vacation stay in [place]. She is now back to using it regularly.  #2 her restless leg question here of was endorsed at moderate impairment quality of life.  It seems that her iron levels have rebounded to some degree , but Ferritin results are pending. Her geriatric depression score does not indicate depression her Epworth sleepiness score was endorsed at 8 points and her fatigue severity score 26 points.  #3 headache are a concern. She hates the pain clinic. We are not providing pain management in this clinic but I do feel that the patient could  change with the same narcotic agreement and contract to [name]  Her main care provider. She has no history of overusing up using or accidentally overdosing any medications. Her depression scores are in normal range not indicated clinical depression at all. The patient is aware that she could receive with a headache attack Depakote IV. However I am not thrilled with the opportunity of preventing migrainous headaches by Depakote, given the risk of weight gain, liver failure tremor. She has been on a beta blocker which has helped to some degree. She would be interested  in some kind of injection therapy but I explained that Botox injections in the Medicare population on very difficult to get paid for. And Botox is an extremely costly medication these days. The patient has some basic medical training and she would be willing to give herself a shot. We discussed TORADOL  which can be taken either as a 10 mg 2 pill or as an IM medication.  #4 patient will receive 1 mL equaling 2,000,000 g of Toradol intramuscular injection today for her acute headache. If this works well for her it could be an alternative treatment and hopefully will relieve her of the need to take narcotics to. The patient is a status post gastric bypass surgery so she cannot have nonsteroidal by mouth .  Interval history from 04/09/2016. [name] is seen here today after she has twice been seen by my colleague Dr. [name]. I had sent her to her to evaluate her headaches and possible dystonia and neuralgic character and she was referred for Dr. Kittie Plater for possible Botox injections. Within the workup for headaches and MRI of the brain was obtained. This showed 2 small wedge shaped foci in the left hemisphere consistent with prior small ischemic strokes. She was started on aspirin baby size and Zetia. There was chronic microvascular ischemic change as expected for age, some cortical atrophy no acute findings. At the time that she saw Dr. [name] she had neck pain for 7-8 months already. Voltaren gel helps a little bit pain on movement. She has migraines without aura that started already when she was a teenager. Tried topiramate in the past has failed amitriptyline, try Toradol, tried Imitrex , Cymbalta.  She has weaned off all pain medications.  CT of the soft tissue of the neck was also reviewed ; there is evidence of cervical disc and facet degeneration. An echocardiogram was ordered revealing a patent foramen ovale or she is scheduled for an occipital nerve block with Dr. Dr. Kittie Plater, he will do a second one  next coming Friday. This has helped her neck pain. She has not seen her cardiologist.  [name] presents today on 10/23/2016. She has recently seen Dr. [name] again who has been successfully treating her head and neck pain. 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 The pain has improved. As to her CPAP use is 87% compliant with an average user time of 6 hours and 9 minutes, she is using an AutoSet between 7 and 15 cm water. Her 95th percentile pressure is 14.4 and straddles close to the maximum pressure allowed. A residual AHI is 7.2 and the residual apneas are obstructive in nature. I do think we should increase the pressure window by 2 cm water. She endorsed today the Epworth sleepiness score at only 4 points.  Her RLS is controlled on 2 nightly doses of medication, each dose giving relief for about 4 hours. She takes quinine for leg cramps.  She has  No arryhthmia, has a loop recorder .  Recent mamogram was normal but her cardiologist discovered a mass under the 4 th rib. She is awaiting a CT chest now.    02-02-2018 SLEEP [facility]ICINE CLINIC-Mrs. Palin is seen here today in a regular revisit, but I have seen her last almost 14 months ago.  Her last visit at [facility] was with Dr. [name] whose note I quote below.  The patient has remained a compliant CPAP user for 25 out of the last 30 days, 5 hours 52 minutes on average use, AutoSet between 5 and 16 cm water pressure with 3 cm EPR residual AHI is 5.7 the vast majority seems to be obstructive in nature yet the 95th percentile pressure is only 14 cmH2O.  We will either have to raise the maximum pressure or reduce the expiratory pressure relief.  She does have high air leaks at times.  She feels extremely fatigued again- but endorsed only 36 points on the fatigue score and 5 points on the Epworth sleepiness score. She wonders if her iron is low and [name] has just yesterday obtained no labs of which are not previous.  She has  had another IV iron infusion in 2018 and one 2 weeks ago.  In addition she reports that Dr. Tamala Julian, her cardiologist, would like her control of cholesterol to be much better and recommends the injectable epatha,  which cost her $500 a month and co-pay.  However it is probably the most effective treatment she can get.  All oral  Statin medications have caused myositis and myalgia.  DR Ahern's note. 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 She has significant pain, decreased ROM which is chronic for over one year. She gets regular massage. We have performed multiple trigger point injections with Depo-Medrol, lidocaine and Marcaine. She has tried baclofen, Cymbalta, oxycodone, Lyrica, gabapentin, Robaxin without any relief. Patient would benefit from Physical therapy for stretching, strengthening, manual therapy and massage as well as dry needling of the right trapezius muscle and any other muscles o modalities as clinically warranted by physical therapy evaluation  Procedure note for Spasmodic torticollis:EMG: EMG guidance was used to inject muscles detailed below. Aseptic procedure was performed and patient tolerated procedure. Procedure was performed by Dr. Myrla Halsted. Patient tolerated the procedure. Refractory to oral medications . Motrin / tylenol for injections site pain / soreness . REMS precautions handout given to patient  RTC - see instructions for details Wasted 200 Units. Dysport-500 units [id number] x 2 vials. Dysport-500 units x2 vial.  Today is 25 May 2018 and I have the pleasure of meeting Hind Chesler in a revisit.  Her migraines have been successfully treated by application of Dysport a Botox analog, in conjunction with Aimovig.    She is supposed to take every 30 days 140 mg/mL and an autoinjector.   The frequency of migraines has significantly decreased.  The cost factor however is hard for her to swallow. She reports increasing and excessive daytime sleepiness.  In the  past she has followed as for CPAP as well as for severe restless legs.  Her restless legs improved after we found a severe iron deficiency and she received IV iron. She states her restless legs have acted out again so we are going to definitely check her ferritin and total iron binding capacity today, I also checked her compliance report for CPAP the patient has been 90% compliant by days 87% compliant in time with an average  use of time on days used of 6 hours and 20 minutes, she is using an AutoSet which allows a minimum pressure of 7 maximum pressure of 16 cmH2O with 3 cm expiratory pressure relief =her current 95th percentile pressure is 14.1, Residually she still has 2.8 obstructive apneas and 0.5 central apneas per hour.   I think it would be beneficial for her to have a slight elevation to 17 cm maximum CPAP  pressure.       Social history:  Retired, married , adult children. Daughter and mother were excessively daytime sleepy. Mother and daughter have RLS.    [facility]  NEUROIMAGING REPORT    STUDY DATE: 02/16/18 PATIENT NAME: Philippa Vessey Tu DOB: 13-Apr-1946 MRN: [id number]  ORDERING CLINICIAN: Larey Seat, MD  CLINICAL HISTORY: 72 year old female with increasing dizziness and headaches.  EXAM: MRI brain (with and without)  TECHNIQUE: MRI of the brain with and without contrast was obtained utilizing 5 mm axial slices with T1, T2, T2 flair, SWI and diffusion weighted views.  T1 sagittal, T2 coronal and postcontrast views in the axial and coronal plane were obtained. CONTRAST: 69ml multihance  COMPARISON: 02/24/16 IMAGING SITE: [facility] [address] (3 Tesla MRI)    FINDINGS:   No abnormal lesions are seen on diffusion-weighted views to suggest acute ischemia. The cortical sulci, fissures and cisterns are notable for mild frontal, moderate parietal and moderate perisylvian atrophy.  Lateral, third and fourth ventricle are normal in  size and appearance. No extra-axial fluid collections are seen. No evidence of mass effect or midline shift.  No abnormal lesions are seen on post contrast views.  Mild periventricular, subcortical, juxtacortical and cortical foci of chronic small vessel ischemic disease.  2 small areas of cortical encephalomalacia and gliosis in the left frontal region.  These are stable from prior MRI in 02/24/16.  On sagittal views the posterior fossa, pituitary gland and corpus callosum are unremarkable. No evidence of intracranial hemorrhage on SWI views. The orbits and their contents, paranasal sinuses and calvarium are unremarkable.  Intracranial flow voids are present.  Right vertebral artery is dominant and takes a tortuous course from the right to left side.  The left vertebral artery is hypoplastic.   IMPRESSION:   Abnormal MRI brain (with and without) demonstrating: - Mild periventricular, subcortical, juxtacortical and cortical foci of chronic small vessel ischemic disease.  - 2 small areas of cortical encephalomalacia and gliosis in the left frontal region.   - Mild frontal moderate parietal and moderate perisylvian atrophy. - No acute findings. - Compared to MRI on 02/24/16 there has been mild progression of atrophy and chronic small vessel ischemic disease     INTERPRETING PHYSICIAN:  Penni Bombard, MD Certified in Neurology, Neurophysiology and Neuroimaging  [facility]   Review of Systems: Out of a complete 14 system review, the patient complains of only the following symptoms, and all other reviewed systems are negative.  Restless legs she often wakes up with a headache and she suffers from at times severe crippling migraines. She has dizziness, optic migraine, scotoma in visiual field.  Leg cramps also interfere with her sleep the initiation of sleep as well as the sleep duration at night.  Epworth Sleepiness score 5, Fatigue severity score 36  from 24 , geriatric depression score 3.   She lost a lot of weight, she is exercising and she weaned aff all narcotics in 2017.   She has parasomnia symptoms , sleep walking  and talking on Ambien. Tablet form , non extended.   Will ask her to take half - 5mg  .     Social History   Socioeconomic History  . Marital status: Married    Spouse name: Pilar Jarvis  . Number of children: 3  . Years of education: college  . Highest education level: Not on file  Occupational History  . Occupation: Retired  Scientific laboratory technician  . Financial resource strain: Not on file  . Food insecurity:    Worry: Not on file    Inability: Not on file  . Transportation needs:    Medical: Not on file    Non-medical: Not on file  Tobacco Use  . Smoking status: Never Smoker  . Smokeless tobacco: Never Used  Substance and Sexual Activity  . Alcohol use: No    Alcohol/week: 0.0 oz  . Drug use: No  . Sexual activity: Yes    Birth control/protection: Post-menopausal  Lifestyle  . Physical activity:    Days per week: Not on file    Minutes per session: Not on file  . Stress: Not on file  Relationships  . Social connections:    Talks on phone: Not on file    Gets together: Not on file    Attends religious service: Not on file    Active member of club or organization: Not on file    Attends meetings of clubs or organizations: Not on file    Relationship status: Not on file  . Intimate partner violence:    Fear of current or ex partner: Not on file    Emotionally abused: Not on file    Physically abused: Not on file    Forced sexual activity: Not on file  Other Topics Concern  . Not on file  Social History Narrative   Lives at home w/ her husband   Caffeine 8-10 cups daily.  (coffee 1 cup am, unsw tea all day long).     Family History  Problem Relation Age of Onset  . CAD Mother   . Hypertension Mother   . Neuropathy Mother   . Osteoarthritis Mother   . Heart disease Mother   . Breast cancer  Maternal Grandmother   . CAD Paternal Grandfather   . Colon cancer Father     Past Medical History:  Diagnosis Date  . 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 Arthritis    generalized  . Asthma   . Atrophic vaginitis   . Back pain    DDD/stenosis  . Carotid stenosis    Carotid US 10/16: Plaque RICA (4-01%), normal LICA  . Depression    takes Cymbalta daily  . Diverticulosis    benign  . DJD (degenerative joint disease)   . Dyslipidemia   . Dysrhythmia   . Family history of GI bleeding   . GERD (gastroesophageal reflux disease)    takes Nexium daily  . Gestational diabetes   . H/O hiatal hernia    surgery for hernia  . Headache(784.0)    takes Imitrex daily as needed and Bisoprolol daily;last migraine was about 2wks ago  . History of bronchitis 1 yr ago  . History of shingles   . Insomnia    takes Ambien nightly  . Joint pain   . Joint swelling   . Leg cramps    takes Flexeril daily as needed  . 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 Nocturia   . Osteoporosis   .  Peripheral neuropathy    takes Gabapentin daily  . Pneumonia 41yrs ago   hx of  . Restless leg syndrome    takes Requip daily  . RLS (restless legs syndrome) 08/16/2015  . Sleep apnea    uses CPAP  . Tubular adenoma of colon   . Vertigo   . Vitamin D deficiency     Current Outpatient Medications  Medication Sig Dispense Refill  . baclofen (LIORESAL) 10 MG tablet Take 1 tablet (10 mg total) by mouth 2 (two) times daily. 60 each 11  . bisoprolol (ZEBETA) 10 MG tablet Take 10 mg by mouth 2 (two) times daily.    . calcium carbonate (OS-CAL) 1250 (500 CA) MG chewable tablet Chew 1 tablet by mouth daily. Not sure of dosage    . diclofenac sodium (VOLTAREN) 1 % GEL Apply 4 g topically 4 (four) times daily. 100 g 11  . DULoxetine (CYMBALTA) 60 MG capsule Take 60 mg by mouth 2 (two) times daily.  11  . Erenumab-aooe (AIMOVIG 140 DOSE) 70 MG/ML SOAJ Inject 2 pens into the skin every 30 (thirty)  days. 2 pen 0  . esomeprazole (NEXIUM) 40 MG capsule Take 40 mg by mouth 2 (two) times daily before a meal.   6  . Evolocumab (REPATHA SURECLICK) 981 MG/ML SOAJ Inject 1 pen into the skin every 14 (fourteen) days. 2 pen 11  . furosemide (LASIX) 20 MG tablet Take 40 mg by mouth daily as needed for fluid or edema.    Marland Kitchen GABAPENTIN PO Take 300 mg by mouth 2 (two) times daily.     . Galcanezumab-gnlm (EMGALITY) 120 MG/ML SOAJ Inject 120 mg into the skin every 30 (thirty) days. 1 pen 11  . HYDROcodone-acetaminophen (NORCO) 10-325 MG tablet Take 1 tablet by mouth every 8 (eight) hours as needed for moderate pain or severe pain.    . Loratadine (CLARITIN) 10 MG CAPS Take 1 capsule by mouth as needed (for allergies).     . ondansetron (ZOFRAN ODT) 4 MG disintegrating tablet Take 1 tablet (4 mg total) by mouth every 8 (eight) hours as needed for nausea or vomiting. 45 tablet 11  . PROAIR HFA 108 (90 Base) MCG/ACT inhaler 2 INHALATIONS EVERY 4 HOURS AS NEEDED FOR ASTHMA SYMPTOMS (WHEEZING/SHORTNESS OF BREATH)  5  . quiNINE (QUALAQUIN) 324 MG capsule Take 1 capsule (324 mg total) by mouth 2 (two) times daily. 180 capsule 3  . rOPINIRole (REQUIP) 1 MG tablet TAKE 1 TABLET(1 MG) BY MOUTH TWICE DAILY BEFORE LUNCH AND SUPPER 180 tablet 3  . topiramate (TOPAMAX) 100 MG tablet Take 1 tablet (100 mg total) by mouth at bedtime. 30 tablet 11  . Vitamin D, Ergocalciferol, (DRISDOL) 50000 units CAPS capsule Take 1 capsule (50,000 Units total) by mouth every 7 (seven) days. 30 capsule 3  . amphetamine-dextroamphetamine (ADDERALL) 10 MG tablet Take 1 tablet (10 mg total) by mouth daily before breakfast. 30 tablet 0   No current facility-administered medications for this visit.     Allergies as of 05/25/2018  . (No Known Allergies)    Vitals: BP 134/71   Pulse 72   Ht 5\' 6"  (1.676 m)   Wt 233 lb (105.7 kg)   BMI 37.61 kg/m  Last Weight:  Wt Readings from Last 1 Encounters:  05/25/18 233 lb (105.7 kg)        Last Height:   Ht Readings from Last 1 Encounters:  05/25/18 5\' 6"  (1.676 m)    Physical exam:  General: The patient is awake, alert and appears not in acute distress. The patient is well groomed. Head: Normocephalic, atraumatic. Neck is now supple. No tenderness. Mallampati 3 ,  neck circumference: 15 inches. Nasal airflow unrestricted, TMJ is not evident. Retrognathia is seen.  Cardiovascular:  Regular rate and rhythm, without  murmurs or carotid bruit, and without distended neck veins. Respiratory: Lungs are clear to auscultation. Skin:  Without evidence of edema, or rash, well healed knee replacement scars.  Trunk: BMI is elevated .  Neurologic exam :The patient is awake and alert, oriented to place and time.  Memory subjective described as intact.  There is a normal attention span & concentration ability. Speech is fluent without  dysarthria, dysphonia or aphasia. Mood and affect are appropriate. Cranial nerves: Pupils are equal and briskly reactive to light.  Extraocular movements  in vertical and horizontal planes intact and without nystagmus. Visual fields by finger perimetry are intact. Hearing to finger rub intact.  Facial sensation intact to fine touch. Facial motor strength is symmetric and tongue and uvula move midline. Shoulder shrug intact . Motor exam:   Normal tone, muscle bulk and symmetric strength in all extremities. No cog wheeling and no waxy or clasp knife response.  Sensory:  Fine touch, pinprick and vibration were tested in all extremities. Proprioception in both feet is affeced by pin and needle dysesthesias.  Coordination: Rapid alternating movements in the fingers/hands is normal. Finger-to-nose maneuver normal without evidence of ataxia, dysmetria or tremor. Gait and station: Patient walks without assistive device . Strength within normal limits. Stance is stable and normal.  Deep tendon reflexes: in the  upper and lower extremities are symmetric and intact.  Babinski maneuver downgoing.   Assessment:  After physical and neurologic examination, review of laboratory studies, imaging, neurophysiology testing and pre-existing records, assessment is   The patient was advised of the nature of the diagnosed sleep disorder , the treatment options and risks for general a health and wellness arising from not treating the condition. Visit duration was 40 minutes.   Since Mrs. Eckert has undergone a gastric bypass surgery in the past she has some mild absorption issues.   She is chronically iron deficient, she has been vitamin D deficient she has been vitamin B12 deficient.   OSA- CPAP A download of her machine was not yet obtained but I reviewed her restless leg symptomatic and her RLS 6 rating scale was endorsed at very low impediment there is no evidence at this time of major quality of life impairment based on restless leg intensity. She is not depressed. Is in the process of losing weight and she appears optimistic and motivated. I'm especially proud that she weaned herself off all narcotics.  Plan:  Treatment plan and additional workup :  1 RLS with iron deficiency ) Dr. Beryle Beams  evaluated her discrepancy between very low ferritin levels  and apparently sufficient iron storage. She has maintained oral iron intake as well as multivitamins and multi minerals. Check anew for ferritin.   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 She is not diabetic she has no documented thyroid disease and neither did her mother nor has her daughter but all of these women half severe painful dysesthesias leg cramps and restless legs. She took quinine with good success, but had to order from [place].   3) OSA- continue on CPAP, auto PAP . Doing well ! The patient's auto CPAP is set between 7 and 15 cm water with 3 cm EPR -  she needs an increase in pressure to 16 cm water. Auto window.  DreamWear mask.   4) HA- 2 possible remote Strokes- I think rather frontal contusion-  ,  coincidential finding during headache work up, March 2017- repeat finding I 2019. . New dizziness spells, ataxia, constant headaches - improved on  Botox and she will continue on AIMOVIG. With Dr. [name]. 2 samples provided.    5) MMSE- next visit , [place].  See me once yearly - next visit with memory.   6) depression-  cymbalta not working on bid, reduced to one tab at night. Will address weaning her and changing back to prozac in 3 month.   Rv with NP in 3 month.  Rv in 6 month  follow up with Dr. [name] I thank Dr [name] for her headache expertise and treatment.  The patient has been relieved of neck pain and occipital neuralgic pain and pain with radiculopathic distribution to the shoulders.    Asencion Partridge Kanitra Purifoy MD  05/25/2018

## 2018-05-26 NOTE — Progress Notes (Signed)
Carelink Summary Report / Loop Recorder 

## 2018-06-08 ENCOUNTER — Telehealth: Payer: Self-pay

## 2018-06-08 NOTE — Telephone Encounter (Signed)
LMOVM reminding pt to send remote transmission.  Pt monitor has not updated in past 14 days

## 2018-06-08 NOTE — Telephone Encounter (Signed)
LMOVM reminding pt to send remote transmission.  Pt monitor has not updated in the last 14 days.

## 2018-06-08 NOTE — Progress Notes (Signed)
letter

## 2018-06-28 ENCOUNTER — Ambulatory Visit (INDEPENDENT_AMBULATORY_CARE_PROVIDER_SITE_OTHER): Payer: Medicare Other | Admitting: *Deleted

## 2018-06-28 DIAGNOSIS — I639 Cerebral infarction, unspecified: Secondary | ICD-10-CM

## 2018-06-28 NOTE — Progress Notes (Signed)
Carelink Summary Report / Loop Recorder 

## 2018-06-29 ENCOUNTER — Encounter: Payer: Self-pay | Admitting: Neurology

## 2018-06-29 ENCOUNTER — Ambulatory Visit (INDEPENDENT_AMBULATORY_CARE_PROVIDER_SITE_OTHER): Payer: Medicare Other | Admitting: Neurology

## 2018-06-29 VITALS — BP 143/79 | HR 74 | Ht 67.0 in | Wt 237.0 lb

## 2018-06-29 DIAGNOSIS — Z9884 Bariatric surgery status: Secondary | ICD-10-CM | POA: Diagnosis not present

## 2018-06-29 DIAGNOSIS — Z862 Personal history of diseases of the blood and blood-forming organs and certain disorders involving the immune mechanism: Secondary | ICD-10-CM | POA: Diagnosis not present

## 2018-06-29 DIAGNOSIS — G4733 Obstructive sleep apnea (adult) (pediatric): Secondary | ICD-10-CM | POA: Diagnosis not present

## 2018-06-29 DIAGNOSIS — G243 Spasmodic torticollis: Secondary | ICD-10-CM

## 2018-06-29 DIAGNOSIS — Z9989 Dependence on other enabling machines and devices: Secondary | ICD-10-CM | POA: Diagnosis not present

## 2018-06-29 DIAGNOSIS — G2581 Restless legs syndrome: Secondary | ICD-10-CM | POA: Diagnosis not present

## 2018-06-29 DIAGNOSIS — K902 Blind loop syndrome, not elsewhere classified: Secondary | ICD-10-CM | POA: Diagnosis not present

## 2018-06-29 NOTE — Progress Notes (Signed)
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 She has significant pain, decreased ROM which is chronic for over one year. She gets regular massage. We have performed multiple trigger point injections with Depo-Medrol, lidocaine and Marcaine. She has tried baclofen, Cymbalta, oxycodone, Lyrica, gabapentin, Robaxin without any relief. Patient would benefit from Physical therapy for stretching, strengthening, manual therapy and massage as well as dry needling of the right trapezius muscle and any other muscles o modalities as clinically warranted by physical therapy evaluation   Procedure note for Spasmodic torticollis:  EMG: EMG guidance was used to inject muscles detailed below. Aseptic procedure was performed and patient tolerated procedure. Procedure was performed by Dr. Myrla Halsted   Patient tolerated the procedure.   Refractory to oral medications   Motrin / tylenol for injections site pain / soreness   REMS precautions handout given to patient   RTC - see instructions for details   Wasted 0 Units.   Dysport-500 units J0586 x 1 vials   Dysport-500unitsx1vial NDC: [address]-1 [id number]  0.9%   DX: G24.3 Buy and Bill  Muscles injected:Muscles injected:  40  right levator scapula.  320 righttrapezius, 40 right semispinalis capitus, 40right splenius capitus, 40 left semispinalis capitus

## 2018-06-29 NOTE — Progress Notes (Signed)
Dysport- 500 units x 1 vials Lot: [id number] Expiration: 07/14/2018 NDC:[address]-1  Bacteriostatic 0.9% Sodium Chloride- 2.25mL total Lot: [id number] Expiration: 04/15/2019 NDC: 4445-8483-50  Dx: G24.3 B/B

## 2018-06-30 LAB — CUP PACEART REMOTE DEVICE CHECK
Date Time Interrogation Session: 20190611220955
MDC IDC PG IMPLANT DT: 20171206

## 2018-06-30 LAB — IRON AND TIBC
Iron Saturation: 26 % (ref 15–55)
Iron: 79 ug/dL (ref 27–139)
Total Iron Binding Capacity: 301 ug/dL (ref 250–450)
UIBC: 222 ug/dL (ref 118–369)

## 2018-06-30 LAB — FERRITIN: Ferritin: 32 ng/mL (ref 15–150)

## 2018-06-30 LAB — VITAMIN B12: VITAMIN B 12: 354 pg/mL (ref 232–1245)

## 2018-07-01 ENCOUNTER — Telehealth: Payer: Self-pay | Admitting: Neurology

## 2018-07-01 NOTE — Telephone Encounter (Signed)
Called the patient. No answer. LVM informing the patient lab work looks good and there were no concerns. Informed the pt to call back if she had questions.

## 2018-07-01 NOTE — Telephone Encounter (Signed)
-----   Message from Larey Seat, MD sent at 06/30/2018  5:49 PM EDT ----- Good iron binding capacity and saturation.

## 2018-07-13 ENCOUNTER — Encounter: Payer: Self-pay | Admitting: Neurology

## 2018-07-30 ENCOUNTER — Ambulatory Visit (INDEPENDENT_AMBULATORY_CARE_PROVIDER_SITE_OTHER): Payer: Medicare Other | Admitting: *Deleted

## 2018-07-30 DIAGNOSIS — I639 Cerebral infarction, unspecified: Secondary | ICD-10-CM | POA: Diagnosis not present

## 2018-08-02 NOTE — Progress Notes (Signed)
Carelink Summary Report / Loop Recorder 

## 2018-08-10 LAB — CUP PACEART REMOTE DEVICE CHECK
Implantable Pulse Generator Implant Date: 20171206
MDC IDC SESS DTM: 20190714224138

## 2018-08-12 ENCOUNTER — Ambulatory Visit: Payer: Medicare Other | Admitting: Neurology

## 2018-08-20 ENCOUNTER — Ambulatory Visit (INDEPENDENT_AMBULATORY_CARE_PROVIDER_SITE_OTHER): Payer: Medicare Other | Admitting: Neurology

## 2018-08-20 DIAGNOSIS — R1115 Cyclical vomiting syndrome unrelated to migraine: Secondary | ICD-10-CM

## 2018-08-20 DIAGNOSIS — G43001 Migraine without aura, not intractable, with status migrainosus: Secondary | ICD-10-CM

## 2018-08-20 DIAGNOSIS — G43711 Chronic migraine without aura, intractable, with status migrainosus: Secondary | ICD-10-CM | POA: Diagnosis not present

## 2018-08-20 MED ORDER — DICLOFENAC SODIUM 1 % TD GEL: 4.0000 g | Freq: Four times a day (QID) | TRANSDERMAL | 11 refills | Status: DC

## 2018-08-20 MED ORDER — ONDANSETRON 4 MG PO TBDP: 4.0000 mg | ORAL_TABLET | Freq: Three times a day (TID) | ORAL | 11 refills | Status: DC | PRN

## 2018-08-20 NOTE — Progress Notes (Signed)
Interval history 02/10/2018: Patient's migraines have improved > 50% in severity and frequency since initiation of botox. +eyes, +masseters,+temples, not levators  Consent Form Botulism Toxin Injection For Chronic Migraine  Botulism toxin has been approved by the Federal drug administration for treatment of chronic migraine. Botulism toxin does not cure chronic migraine and it may not be effective in some patients.  The administration of botulism toxin is accomplished by injecting a small amount of toxin into the muscles of the neck and head. Dosage must be titrated for each individual. Any benefits resulting from botulism toxin tend to wear off after 3 months with a repeat injection required if benefit is to be maintained. Injections are usually done every 3-4 months with maximum effect peak achieved by about 2 or 3 weeks. Botulism toxin is expensive and you should be sure of what costs you will incur resulting from the injection.  The side effects of botulism toxin use for chronic migraine may include:   -Transient, and usually mild, facial weakness with facial injections  -Transient, and usually mild, head or neck weakness with head/neck injections  -Reduction or loss of forehead facial animation due to forehead muscle              weakness  -Eyelid drooping  -Dry eye  -Pain at the site of injection or bruising at the site of injection  -Double vision  -Potential unknown long term risks  Contraindications: You should not have Botox if you are pregnant, nursing, allergic to albumin, have an infection, skin condition, or muscle weakness at the site of the injection, or have myasthenia gravis, Lambert-Eaton syndrome, or ALS.  It is also possible that as with any injection, there may be an allergic reaction or no effect from the medication. Reduced effectiveness after repeated injections is sometimes seen and rarely infection at the injection site may occur. All care will be taken to  prevent these side effects. If therapy is given over a long time, atrophy and wasting in the muscle injected may occur. Occasionally the patient's become refractory to treatment because they develop antibodies to the toxin. In this event, therapy needs to be modified.  I have read the above information and consent to the administration of botulism toxin.    ______________  _____   _________________  Patient signature     Date   Witness signature       BOTOX PROCEDURE NOTE FOR MIGRAINE HEADACHE    Contraindications and precautions discussed with patient(above). Aseptic procedure was observed and patient tolerated procedure. Procedure performed by Dr. Georgia Dom  The condition has existed for more than 6 months, and pt does not have a diagnosis of ALS, Myasthenia Gravis or Lambert-Eaton Syndrome. Risks and benefits of injections discussed and pt agrees to proceed with the procedure. Written consent obtained  These injections are medically necessary. He receives good benefits from these injections. These injections do not cause sedations or hallucinations which the oral therapies may cause.  Indication/Diagnosis: chronic migraine BOTOX(J0585) injection was performed according to protocol by [employer]. 200 units of BOTOX was dissolved into 4 cc NS.  NDC: [address]-38  Type of toxin: Botox 100U x 2 = total 200 units  Lot # [id number] 8/21 [id number] 08/21   Description of procedure:  The patient was placed in a sitting position. The standard protocol was used for Botox as follows, with 5 units of Botox injected at each site:   -Procerus muscle, midline injection  -Corrugator muscle, bilateral injection  -  Frontalis muscle, bilateral injection, with 2 sites each side, medial injection was performed in the upper one third of the frontalis muscle, in the region vertical from the medial inferior edge of the superior orbital rim. The lateral injection was again in the upper one third of  the forehead vertically above the lateral limbus of the cornea, 1.5 cm lateral to the medial injection site.  -Temporalis muscle injection, 4 sites, bilaterally. The first injection was 3 cm above the tragus of the ear, second injection site was 1.5 cm to 3 cm up from the first injection site in line with the tragus of the ear. The third injection site was 1.5-3 cm forward between the first 2 injection sites. The fourth injection site was 1.5 cm posterior to the second injection site.  -Occipitalis muscle injection, 3 sites, bilaterally. The first injection was done one half way between the occipital protuberance and the tip of the mastoid process behind the ear. The second injection site was done lateral and superior to the first, 1 fingerbreadth from the first injection. The third injection site was 1 fingerbreadth superiorly and medially from the first injection site.  -Cervical paraspinal muscle injection, 2 sites, bilateral knee first injection site was 1 cm from the midline of the cervical spine, 3 cm inferior to the lower border of the occipital protuberance. The second injection site was 1.5 cm superiorly and laterally to the first injection site.  -Trapezius muscle injection was performed at 3 sites, bilaterally. The first injection site was in the upper trapezius muscle halfway between the inflection point of the neck, and the acromion. The second injection site was one half way between the acromion and the first injection site. The third injection was done between the first injection site and the inflection point of the neck.   Will return for repeat injection in 3 months.   A 200 unit sof Botox was used, 155 units were injected, the rest of the Botox was wasted. The patient tolerated the procedure well, there were no complications of the above procedure.

## 2018-08-20 NOTE — Progress Notes (Signed)
Botox-100unitsx2 vials Lot: [id number] Expiration: 02/2021 NDC: 8957-0220-26 [id number]  0.9% Sodium Chloride- 64mL total Lot: [id number] Expiration: 09/2019 NDC: 6887-3730-81  Dx: Q83.870 B/B

## 2018-08-26 ENCOUNTER — Ambulatory Visit: Payer: Medicare Other | Admitting: Podiatry

## 2018-09-01 ENCOUNTER — Ambulatory Visit (INDEPENDENT_AMBULATORY_CARE_PROVIDER_SITE_OTHER): Payer: Medicare Other | Admitting: *Deleted

## 2018-09-01 DIAGNOSIS — I639 Cerebral infarction, unspecified: Secondary | ICD-10-CM | POA: Diagnosis not present

## 2018-09-02 NOTE — Progress Notes (Signed)
Carelink Summary Report / Loop Recorder 

## 2018-09-03 DIAGNOSIS — E559 Vitamin D deficiency, unspecified: Secondary | ICD-10-CM | POA: Diagnosis not present

## 2018-09-03 DIAGNOSIS — I10 Essential (primary) hypertension: Secondary | ICD-10-CM | POA: Diagnosis not present

## 2018-09-03 DIAGNOSIS — E538 Deficiency of other specified B group vitamins: Secondary | ICD-10-CM | POA: Diagnosis not present

## 2018-09-03 DIAGNOSIS — Z79899 Other long term (current) drug therapy: Secondary | ICD-10-CM | POA: Diagnosis not present

## 2018-09-03 DIAGNOSIS — E611 Iron deficiency: Secondary | ICD-10-CM | POA: Diagnosis not present

## 2018-09-03 DIAGNOSIS — I639 Cerebral infarction, unspecified: Secondary | ICD-10-CM | POA: Diagnosis not present

## 2018-09-06 LAB — CUP PACEART REMOTE DEVICE CHECK
Implantable Pulse Generator Implant Date: 20171206
MDC IDC SESS DTM: 20190816223700

## 2018-09-09 ENCOUNTER — Ambulatory Visit (INDEPENDENT_AMBULATORY_CARE_PROVIDER_SITE_OTHER): Payer: Medicare Other | Admitting: Podiatry

## 2018-09-09 ENCOUNTER — Telehealth: Payer: Self-pay | Admitting: Neurology

## 2018-09-09 ENCOUNTER — Encounter: Payer: Self-pay | Admitting: Podiatry

## 2018-09-09 ENCOUNTER — Other Ambulatory Visit: Payer: Self-pay | Admitting: *Deleted

## 2018-09-09 DIAGNOSIS — I639 Cerebral infarction, unspecified: Secondary | ICD-10-CM

## 2018-09-09 DIAGNOSIS — R600 Localized edema: Secondary | ICD-10-CM

## 2018-09-09 DIAGNOSIS — M779 Enthesopathy, unspecified: Secondary | ICD-10-CM

## 2018-09-09 DIAGNOSIS — M778 Other enthesopathies, not elsewhere classified: Secondary | ICD-10-CM

## 2018-09-09 MED ORDER — FUROSEMIDE 20 MG PO TABS: 20.0000 mg | ORAL_TABLET | Freq: Two times a day (BID) | ORAL | 3 refills | Status: DC

## 2018-09-09 MED ORDER — ERENUMAB-AOOE 70 MG/ML ~~LOC~~ SOAJ: 2.0000 "pen " | SUBCUTANEOUS | 0 refills | Status: DC

## 2018-09-09 NOTE — Telephone Encounter (Signed)
Going to Reach Out to Rep To see whet I can to do to help Mis  [place]. Romelle Starcher will you call her about Amiviog  Is she approved ?

## 2018-09-09 NOTE — Progress Notes (Signed)
Sample order placed for Aimovig per Dr. [name].

## 2018-09-09 NOTE — Telephone Encounter (Signed)
Yes she was approved through 09/2018.

## 2018-09-09 NOTE — Progress Notes (Signed)
Subjective:   Patient ID: [name], female   DOB: 72 y.o.   MRN: [id number]   HPI Patient presents stating she is having a lot of pain on top of both feet and also some swelling and she is been on Lasix and would like for me to write this for her as her family doctor is been on Aleve.  Patient states she gets swelling around her ankles and the Lasix is very effective for reducing it   ROS      Objective:  Physical Exam  Neurovascular status intact with exquisite inflammation dorsal tendon complex bilateral with patient noted to have moderate edema in the ankle region bilateral that she states is normal with negative Homans sign noted      Assessment:  Extensor tendinitis bilateral with localized edema in the ankle region bilateral     Plan:  I reviewed her previous Lasix and we will start her on the same prescription that she is been taking for a long time and today I injected the dorsal tendon complex bilateral 3 mg Kenalog 5 mg Xylocaine advised on heat therapy and reappoint on an as-needed basis

## 2018-09-10 DIAGNOSIS — D509 Iron deficiency anemia, unspecified: Secondary | ICD-10-CM | POA: Diagnosis not present

## 2018-09-10 DIAGNOSIS — I10 Essential (primary) hypertension: Secondary | ICD-10-CM | POA: Diagnosis not present

## 2018-09-10 DIAGNOSIS — G243 Spasmodic torticollis: Secondary | ICD-10-CM | POA: Diagnosis not present

## 2018-09-10 DIAGNOSIS — Z23 Encounter for immunization: Secondary | ICD-10-CM | POA: Diagnosis not present

## 2018-09-10 DIAGNOSIS — F988 Other specified behavioral and emotional disorders with onset usually occurring in childhood and adolescence: Secondary | ICD-10-CM | POA: Diagnosis not present

## 2018-09-10 DIAGNOSIS — I639 Cerebral infarction, unspecified: Secondary | ICD-10-CM | POA: Diagnosis not present

## 2018-09-10 DIAGNOSIS — G4700 Insomnia, unspecified: Secondary | ICD-10-CM | POA: Diagnosis not present

## 2018-09-10 DIAGNOSIS — E7800 Pure hypercholesterolemia, unspecified: Secondary | ICD-10-CM | POA: Diagnosis not present

## 2018-09-10 DIAGNOSIS — I272 Pulmonary hypertension, unspecified: Secondary | ICD-10-CM | POA: Diagnosis not present

## 2018-09-10 DIAGNOSIS — M1990 Unspecified osteoarthritis, unspecified site: Secondary | ICD-10-CM | POA: Diagnosis not present

## 2018-09-10 DIAGNOSIS — J45909 Unspecified asthma, uncomplicated: Secondary | ICD-10-CM | POA: Diagnosis not present

## 2018-09-10 DIAGNOSIS — Z6838 Body mass index (BMI) 38.0-38.9, adult: Secondary | ICD-10-CM | POA: Diagnosis not present

## 2018-09-13 LAB — CUP PACEART REMOTE DEVICE CHECK
Date Time Interrogation Session: 20190918233623
MDC IDC PG IMPLANT DT: 20171206

## 2018-09-13 NOTE — Telephone Encounter (Signed)
[name] patient cannot come to meet with you today. Can she come in the morning?  Please call and advise.

## 2018-09-16 NOTE — Telephone Encounter (Signed)
Patient  Came brought proof of income. Repatha application signed buy patient proof of income  attached. Dr. [name] please sign Patient assistance forms . Repatha turn around time for approval will be  7-10 day turn time for approval.   Romelle Starcher, if patient is approved for Aimovig will you call into her pharmacy.

## 2018-09-23 ENCOUNTER — Other Ambulatory Visit: Payer: Self-pay | Admitting: *Deleted

## 2018-09-23 MED ORDER — ERENUMAB-AOOE 140 MG/ML ~~LOC~~ SOAJ: 140.0000 mg | SUBCUTANEOUS | 11 refills | Status: DC

## 2018-09-23 NOTE — Telephone Encounter (Signed)
Aimovig 140 mg prescription sent to pt's pharmacy. Dr. [name] aware.

## 2018-09-28 ENCOUNTER — Telehealth: Payer: Self-pay | Admitting: Neurology

## 2018-09-28 NOTE — Telephone Encounter (Signed)
Patient states she went to go pick up her Aimovig and it was $ 150.00

## 2018-09-28 NOTE — Telephone Encounter (Signed)
Rep from [place] [employer] called and stated that I needed the social security form to be sent in not bank statement and a PA needed to be on file for [place] . Bethany please advise.

## 2018-09-28 NOTE — Telephone Encounter (Signed)
Medication cost without insurance is between $600-700. Was patient able to get financial assistance for the Aimovig?

## 2018-09-28 NOTE — Telephone Encounter (Signed)
Called patient and left her a message stating that her [place]  will have to be done buy her cardiologist  Because the company is requiring a PA to be on file.   [employer] -  Aimovig  also has patient assistance patient will need fill and submit .

## 2018-09-28 NOTE — Telephone Encounter (Signed)
Pt is asking for a call back to r/s her Dysport Injection

## 2018-09-29 ENCOUNTER — Ambulatory Visit: Payer: Medicare Other | Admitting: Neurology

## 2018-09-29 NOTE — Telephone Encounter (Signed)
Noted  

## 2018-09-30 NOTE — Telephone Encounter (Signed)
Called Patient and left her another message asking her to call me back.

## 2018-10-01 NOTE — Telephone Encounter (Signed)
Late entry; I spoke with the patient and rescheduled.

## 2018-10-04 ENCOUNTER — Telehealth: Payer: Self-pay | Admitting: Neurology

## 2018-10-04 ENCOUNTER — Ambulatory Visit (INDEPENDENT_AMBULATORY_CARE_PROVIDER_SITE_OTHER): Payer: Medicare Other | Admitting: *Deleted

## 2018-10-04 DIAGNOSIS — I639 Cerebral infarction, unspecified: Secondary | ICD-10-CM | POA: Diagnosis not present

## 2018-10-04 NOTE — Telephone Encounter (Signed)
Spoke with Dr. [name], unable to do injection today. Spoke with pt and she is aware. She verbalized understanding. She asked for a later appt tomorrow afternoon if opening becomes available.

## 2018-10-04 NOTE — Telephone Encounter (Signed)
Left patient another message waiting for to call me back waiting for her to call me back I need her more information from her.

## 2018-10-04 NOTE — Telephone Encounter (Signed)
You can give her a few aimovigs as well to help her get through I think we have some int he fridge thanks

## 2018-10-04 NOTE — Telephone Encounter (Signed)
Spoke with Dr. [name]. Since Cardiology is prescribing the [place] they will need to complete the PA for the patient so she can get assistance.

## 2018-10-04 NOTE — Telephone Encounter (Addendum)
[name] is wanting to come today for her injection if she can.

## 2018-10-04 NOTE — Telephone Encounter (Signed)
Pt returning Dana's call

## 2018-10-04 NOTE — Telephone Encounter (Addendum)
I spoke to Drug Rep today at [facility]  [phone] . Robyn asked me to call [facility] and give them new [place] number [phone].   AIMOVIG  151.97 every month REPATHA 122.00 Every Month   Patient is in [place] doughnut hole right now.  I have processed patient's assistance for Repatha . ([employer]) Need's Patient's social security stub. Patient states she will look for it and bring to me 10/05/2018.  Bethany , I need a PA for Repatha to send into [employer] please.

## 2018-10-05 ENCOUNTER — Telehealth: Payer: Self-pay | Admitting: Pharmacist

## 2018-10-05 ENCOUNTER — Other Ambulatory Visit: Payer: Self-pay | Admitting: *Deleted

## 2018-10-05 ENCOUNTER — Ambulatory Visit (INDEPENDENT_AMBULATORY_CARE_PROVIDER_SITE_OTHER): Payer: Medicare Other | Admitting: Neurology

## 2018-10-05 ENCOUNTER — Telehealth: Payer: Self-pay | Admitting: Neurology

## 2018-10-05 DIAGNOSIS — K902 Blind loop syndrome, not elsewhere classified: Secondary | ICD-10-CM | POA: Diagnosis not present

## 2018-10-05 DIAGNOSIS — G243 Spasmodic torticollis: Secondary | ICD-10-CM | POA: Diagnosis not present

## 2018-10-05 DIAGNOSIS — R0781 Pleurodynia: Secondary | ICD-10-CM | POA: Diagnosis not present

## 2018-10-05 DIAGNOSIS — Z9884 Bariatric surgery status: Secondary | ICD-10-CM | POA: Diagnosis not present

## 2018-10-05 DIAGNOSIS — W19XXXA Unspecified fall, initial encounter: Secondary | ICD-10-CM | POA: Diagnosis not present

## 2018-10-05 DIAGNOSIS — M79671 Pain in right foot: Secondary | ICD-10-CM | POA: Diagnosis not present

## 2018-10-05 DIAGNOSIS — D509 Iron deficiency anemia, unspecified: Secondary | ICD-10-CM

## 2018-10-05 DIAGNOSIS — S79911A Unspecified injury of right hip, initial encounter: Secondary | ICD-10-CM | POA: Diagnosis not present

## 2018-10-05 NOTE — Telephone Encounter (Signed)
Received call from [place], pt's neurology office inquiring when pt last picked up [place]. Advised that we prescribed the medication, but do not dispense the medication so she will need to call the pharmacy to determine when last dispensed. She inquires about patient assistance and states that they are unable to assist patient as they do not prescribe the medication. Advised that we could assist with PA, but last we were aware the medication was cost-effective for the patient (see phone note from [place], PharmD). She states the patient is being seen in the office today and asks for a number to provide the patient to call for patient assistance. Provided our number for the patient to call if needs assistance. Will await call after visit today with neurologist.

## 2018-10-05 NOTE — Telephone Encounter (Signed)
Patient given [employer] application for Aimovig assistance. She will finish filling it out then turn in for our portion to be completed. PA will need to be done again for further coverage.

## 2018-10-05 NOTE — Telephone Encounter (Signed)
[place] cardiology Dr. Daneen Schick    and spoke to [facility] in pharmacy and she relayed that they would be glad to help with patient assistance with Repatha for miss Reindel she needs to reach out to them at [phone] .  I have called and left Miss Nied a message as well. Thanks Hinton Dyer.

## 2018-10-05 NOTE — Progress Notes (Signed)
Carelink Summary Report / Loop Recorder 

## 2018-10-05 NOTE — Progress Notes (Signed)
Dysport- 500 units x 2 vials Lot: [id number] Expiration: 05/15/2019 NDC: [address]-5  Bacteriostatic 0.9% Sodium Chloride Lot: [id number] Expiration: 09/15/2019 NDC: 4332-9518-84  Dx: G24.3 B/B

## 2018-10-05 NOTE — Telephone Encounter (Signed)
Noted thank you

## 2018-10-05 NOTE — Telephone Encounter (Signed)
12 wk Dysport

## 2018-10-05 NOTE — Progress Notes (Signed)
While pt at office today for Dysport injection, she reported her ferritin level had dropped to about 27. Dr. Brett Fairy aware. Will recheck Ferritin level today.

## 2018-10-06 ENCOUNTER — Ambulatory Visit: Payer: Medicare Other | Admitting: Podiatry

## 2018-10-06 LAB — FERRITIN: FERRITIN: 26 ng/mL (ref 15–150)

## 2018-10-06 NOTE — Telephone Encounter (Signed)
I called and scheduled the patient for her next Dysport injection. DW

## 2018-10-07 ENCOUNTER — Ambulatory Visit (INDEPENDENT_AMBULATORY_CARE_PROVIDER_SITE_OTHER): Payer: Medicare Other

## 2018-10-07 ENCOUNTER — Telehealth: Payer: Self-pay | Admitting: *Deleted

## 2018-10-07 ENCOUNTER — Ambulatory Visit (INDEPENDENT_AMBULATORY_CARE_PROVIDER_SITE_OTHER): Payer: Medicare Other | Admitting: Podiatry

## 2018-10-07 ENCOUNTER — Other Ambulatory Visit: Payer: Self-pay | Admitting: Podiatry

## 2018-10-07 ENCOUNTER — Encounter: Payer: Self-pay | Admitting: Podiatry

## 2018-10-07 DIAGNOSIS — M79671 Pain in right foot: Secondary | ICD-10-CM

## 2018-10-07 DIAGNOSIS — M79672 Pain in left foot: Secondary | ICD-10-CM | POA: Diagnosis not present

## 2018-10-07 DIAGNOSIS — M779 Enthesopathy, unspecified: Principal | ICD-10-CM

## 2018-10-07 DIAGNOSIS — S92502D Displaced unspecified fracture of left lesser toe(s), subsequent encounter for fracture with routine healing: Secondary | ICD-10-CM | POA: Diagnosis not present

## 2018-10-07 DIAGNOSIS — M778 Other enthesopathies, not elsewhere classified: Secondary | ICD-10-CM

## 2018-10-07 DIAGNOSIS — I639 Cerebral infarction, unspecified: Secondary | ICD-10-CM

## 2018-10-07 MED ORDER — TRIAMCINOLONE ACETONIDE 10 MG/ML IJ SUSP
10.0000 mg | Freq: Once | INTRAMUSCULAR | Status: AC
  Administered 2018-10-07: 10 mg

## 2018-10-07 NOTE — Telephone Encounter (Signed)
Called pt and discussed that her ferritin level was 26, too low, and Dr. Brett Fairy wants her evaluated for IV iron therapy. Pt verbalized understanding and wanted to know if this would be done at our office. She stated that [name] is on medical leave currently.

## 2018-10-07 NOTE — Telephone Encounter (Signed)
-----   Message from Larey Seat, MD sent at 10/06/2018  1:22 PM EDT ----- Ferritin is too low- and this promoted RLS in there past, evaluate for iv iron therapy.

## 2018-10-08 NOTE — Progress Notes (Signed)
Subjective:   Patient ID: [name], female   DOB: 72 y.o.   MRN: [id number]   HPI Patient presents stating she is concerned because she traumatized her left foot and she is worried she may have broken it when she turned and fell on some concrete 3 weeks ago.  Also is having pain in her dorsal tendon complex that is more distal than it was previously right over left   ROS      Objective:  Physical Exam  Neurovascular status intact with patient found to have dorsal inflammation tendinitis that is occurring from the midfoot in the distal direction that is localized in nature.  Patient also is noted to have swelling of the dorsal left foot with bruising around the metatarsal phalangeal joints where it appears that she had trauma and may have fractured     Assessment:  Dorsal tendinitis bilateral along with inflammatory traumatized foot condition left with possibility for fracture     Plan:  H&P x-ray left reviewed.  Today I went ahead and advised on ice therapy supportive shoes and I did do more distal injections into the extensor complex in the area of intense discomfort 3 mg Kenalog 5 mg Xylocaine and advised on heat ice therapy.  Patient will be seen back to recheck  X-ray indicates that there is no signs of fracture of the dorsal left foot with mild swelling and soft tissue noted on x-ray

## 2018-10-11 NOTE — Telephone Encounter (Signed)
Yes< we have iron infusions. Ferrelicit or similar. One this week and repeat one in December , repeat Ferritin after that.

## 2018-10-12 NOTE — Telephone Encounter (Signed)
Spoke with Otila Kluver RN in infusion. Order written for Venofer 100 mg IV x 1 then repeat once in December. Order ready for MD signature.

## 2018-10-12 NOTE — Telephone Encounter (Addendum)
Order signed and given along with insurance information to [employer] in [place] who will process order and then call patient to scheduled. Patient aware of Venofer infusion and schedule. She will await a call to schedule.

## 2018-10-12 NOTE — Progress Notes (Signed)
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 She has significant pain, decreased ROM which is chronic for over one year. She gets regular massage. We have performed multiple trigger point injections with Depo-Medrol, lidocaine and Marcaine. She has tried baclofen, Cymbalta, oxycodone, Lyrica, gabapentin, Robaxin without any relief.   She has received excellent results from dysport therapy, > 50% improvement in pain and range of motion. Will increase dosage today for better pain management and increased ROM.   Procedure note for Spasmodic torticollis:  EMG: EMG guidance was used to inject muscles detailed below. Aseptic procedure was performed and patient tolerated procedure. Procedure was performed by Dr. Myrla Halsted   Patient tolerated the procedure.   Refractory to oral medications   Motrin / tylenol for injections site pain / soreness   REMS precautions handout given to patient   RTC - see instructions for details   Wasted 110 Units.    DX: G24.3 Buy and Bill  Muscles injected:Muscles injected:  90 right levator scapula.  410 righttrapezius, 150nright semispinalis capitus, 150right splenius capitus, 90 left trapezius,

## 2018-10-21 ENCOUNTER — Telehealth: Payer: Self-pay | Admitting: *Deleted

## 2018-10-21 NOTE — Telephone Encounter (Signed)
Marine scientist completed and signed by Dr. [name] and faxed to [employer] along with [place] approval letter from [place]. Received a receipt of confirmation.  Copies of application and approval letter sent to medical records for scanning.

## 2018-10-21 NOTE — Telephone Encounter (Signed)
Called [employer] and received pt's part D benefit information.  Cigna ID: [id number] BIN: [id number] PCN: [id number]  Completed Aimovig 140 mg PA on Cover My Meds. KEY: [id number].   "If [employer] has not replied to your request within 24 hours please contact [employer] at [phone]. For [facility] members, please call [phone]."

## 2018-10-21 NOTE — Telephone Encounter (Signed)
Received determination from [place]. Aimovig 140 mg has been approved from 10/21/2018 through 10/21/2019.   Approval notification printed, ready to be sent with [employer] safety net foundation patient assistance application. Application ready for MD signature.

## 2018-10-22 LAB — CUP PACEART REMOTE DEVICE CHECK
Date Time Interrogation Session: 20191021233940
Implantable Pulse Generator Implant Date: 20171206

## 2018-10-26 NOTE — Telephone Encounter (Signed)
Received notification from the [employer] stating that patient has been deemed eligible to receive [place] from 10/26/18 through 12/15/19! Patient will need to re-enroll at the end of the enrollment period. For any questions, please contact [phone].  Case number: [id number]   Approval letter sent to medical records to be scanned. Called pt and made her aware. She verbalized appreciation. She is aware that [employer] will contact her shortly to setup shipment.

## 2018-10-29 DIAGNOSIS — S92525A Nondisplaced fracture of medial phalanx of left lesser toe(s), initial encounter for closed fracture: Secondary | ICD-10-CM | POA: Diagnosis not present

## 2018-10-29 DIAGNOSIS — M7541 Impingement syndrome of right shoulder: Secondary | ICD-10-CM | POA: Diagnosis not present

## 2018-11-01 ENCOUNTER — Encounter: Payer: Self-pay | Admitting: Adult Health

## 2018-11-08 ENCOUNTER — Ambulatory Visit (INDEPENDENT_AMBULATORY_CARE_PROVIDER_SITE_OTHER): Payer: Medicare Other

## 2018-11-08 ENCOUNTER — Ambulatory Visit: Payer: Medicare Other | Admitting: Adult Health

## 2018-11-08 DIAGNOSIS — I639 Cerebral infarction, unspecified: Secondary | ICD-10-CM

## 2018-11-08 NOTE — Progress Notes (Signed)
Carelink Summary Report / Loop Recorder 

## 2018-11-15 ENCOUNTER — Telehealth: Payer: Self-pay | Admitting: Neurology

## 2018-11-15 ENCOUNTER — Ambulatory Visit (INDEPENDENT_AMBULATORY_CARE_PROVIDER_SITE_OTHER): Payer: Medicare Other | Admitting: Neurology

## 2018-11-15 DIAGNOSIS — R29898 Other symptoms and signs involving the musculoskeletal system: Secondary | ICD-10-CM

## 2018-11-15 DIAGNOSIS — I639 Cerebral infarction, unspecified: Secondary | ICD-10-CM | POA: Diagnosis not present

## 2018-11-15 DIAGNOSIS — R202 Paresthesia of skin: Secondary | ICD-10-CM | POA: Diagnosis not present

## 2018-11-15 DIAGNOSIS — G43711 Chronic migraine without aura, intractable, with status migrainosus: Secondary | ICD-10-CM

## 2018-11-15 DIAGNOSIS — M542 Cervicalgia: Secondary | ICD-10-CM | POA: Diagnosis not present

## 2018-11-15 DIAGNOSIS — M5412 Radiculopathy, cervical region: Secondary | ICD-10-CM | POA: Diagnosis not present

## 2018-11-15 DIAGNOSIS — G8929 Other chronic pain: Secondary | ICD-10-CM

## 2018-11-15 DIAGNOSIS — R200 Anesthesia of skin: Secondary | ICD-10-CM | POA: Diagnosis not present

## 2018-11-15 MED ORDER — SUMATRIPTAN 10 MG/ACT NA SOLN: 1.0000 | Freq: Once | NASAL | 0 refills | Status: DC

## 2018-11-15 NOTE — Addendum Note (Signed)
Addended by: Sarina Ill B on: 11/15/2018 12:16 PM   Modules accepted: Orders

## 2018-11-15 NOTE — Telephone Encounter (Signed)
Patient was seen today for botox and had a question about Aimovig at [place]. Nurse was unavailable so I informed patient to call back later this afternoon.

## 2018-11-15 NOTE — Telephone Encounter (Signed)
Reached out to patient on mobile # to address her question. LVM asking for call back at her convenience. Left office number in message.

## 2018-11-15 NOTE — Progress Notes (Signed)
Patient with chronic neck pain, weakness in the arms, numbness and tingling in the fingers, decreased Range of motion of the cervical spine. Been ongoing for years, conservative methods have included PT, massage, dry needling, muscle relaxants (baclofen, tizanidine, flexeril), heating, stretching under the care of a physician for > 1 year even botox injections for cervical hypertrophy will order an MRI cervical spine. Exam significant for decreased ROM in the neck, hypertrophied cervical muscles, proximal weakness left deltoid and triceps 4/5 with decreased biceps reflex need MRI cervical spine to evaluate for cervical radiculopathy, cervical stenosis for injection or surgical options. + phalen's maneuver left hand.   MRI cervical spine Referral to Dr. Laurence Spates for epidural steroid injections or facet injections Emg/ncs bilateral uppers  Orders Placed This Encounter  Procedures  . MR CERVICAL SPINE WO CONTRAST  . Ambulatory referral to Orthopedic Surgery  . NCV with EMG(electromyography)   A total of 25 minutes was spent face-to-face with this patient. Over half this time was spent on counseling patient on the  1. Cervical radiculopathy   2. Right arm weakness   3. Decreased range of motion of neck   4. Numbness and tingling in left hand   5. Numbness and tingling in right hand   6. Chronic neck pain     diagnosis and different diagnostic and therapeutic options, counseling and coordination of care, risks ans benefits of management, compliance, or risk factor reduction and education.  This does not include time spent on Botox procedure.

## 2018-11-15 NOTE — Progress Notes (Signed)
Consent Form Botulism Toxin Injection For Chronic Migraine  Interval history 11/15/2018: Patient's migraines have improved > 70% in severity and frequency since initiation of botox. +orbic occ, +masseters,+temples, not levators.  Reviewed orally with patient, additionally signature is on file:  Botulism toxin has been approved by the Federal drug administration for treatment of chronic migraine. Botulism toxin does not cure chronic migraine and it may not be effective in some patients.  The administration of botulism toxin is accomplished by injecting a small amount of toxin into the muscles of the neck and head. Dosage must be titrated for each individual. Any benefits resulting from botulism toxin tend to wear off after 3 months with a repeat injection required if benefit is to be maintained. Injections are usually done every 3-4 months with maximum effect peak achieved by about 2 or 3 weeks. Botulism toxin is expensive and you should be sure of what costs you will incur resulting from the injection.  The side effects of botulism toxin use for chronic migraine may include:   -Transient, and usually mild, facial weakness with facial injections  -Transient, and usually mild, head or neck weakness with head/neck injections  -Reduction or loss of forehead facial animation due to forehead muscle weakness  -Eyelid drooping  -Dry eye  -Pain at the site of injection or bruising at the site of injection  -Double vision  -Potential unknown long term risks  Contraindications: You should not have Botox if you are pregnant, nursing, allergic to albumin, have an infection, skin condition, or muscle weakness at the site of the injection, or have myasthenia gravis, Lambert-Eaton syndrome, or ALS.  It is also possible that as with any injection, there may be an allergic reaction or no effect from the medication. Reduced effectiveness after repeated injections is sometimes seen and rarely infection at the  injection site may occur. All care will be taken to prevent these side effects. If therapy is given over a long time, atrophy and wasting in the muscle injected may occur. Occasionally the patient's become refractory to treatment because they develop antibodies to the toxin. In this event, therapy needs to be modified.  I have read the above information and consent to the administration of botulism toxin.    BOTOX PROCEDURE NOTE FOR MIGRAINE HEADACHE    Contraindications and precautions discussed with patient(above). Aseptic procedure was observed and patient tolerated procedure. Procedure performed by Dr. Georgia Dom  The condition has existed for more than 6 months, and pt does not have a diagnosis of ALS, Myasthenia Gravis or Lambert-Eaton Syndrome.  Risks and benefits of injections discussed and pt agrees to proceed with the procedure.  Written consent obtained  These injections are medically necessary. Pt  receives good benefits from these injections. These injections do not cause sedations or hallucinations which the oral therapies may cause.  Description of procedure:  The patient was placed in a sitting position. The standard protocol was used for Botox as follows, with 5 units of Botox injected at each site:   -Procerus muscle, midline injection  -Corrugator muscle, bilateral injection  -Frontalis muscle, bilateral injection, with 2 sites each side, medial injection was performed in the upper one third of the frontalis muscle, in the region vertical from the medial inferior edge of the superior orbital rim. The lateral injection was again in the upper one third of the forehead vertically above the lateral limbus of the cornea, 1.5 cm lateral to the medial injection site.  -Temporalis muscle injection, 5  sites, bilaterally. The first injection was 3 cm above the tragus of the ear, second injection site was 1.5 cm to 3 cm up from the first injection site in line with the tragus of  the ear. The third injection site was 1.5-3 cm forward between the first 2 injection sites. The fourth injection site was 1.5 cm posterior to the second injection site. 5th site laterally in the temporalis  muscleat the level of the outer canthus.  - Patient feels her clenching is a trigger for headaches. +5 units masseter bilaterally   - Patient feels the migraines are centered around the eyes +5 units bilaterally at the outer canthus in the orbicularis occuli  -Occipitalis muscle injection, 3 sites, bilaterally. The first injection was done one half way between the occipital protuberance and the tip of the mastoid process behind the ear. The second injection site was done lateral and superior to the first, 1 fingerbreadth from the first injection. The third injection site was 1 fingerbreadth superiorly and medially from the first injection site.  -Cervical paraspinal muscle injection, 2 sites, bilateral knee first injection site was 1 cm from the midline of the cervical spine, 3 cm inferior to the lower border of the occipital protuberance. The second injection site was 1.5 cm superiorly and laterally to the first injection site.  -Trapezius muscle injection was performed at 3 sites, bilaterally. The first injection site was in the upper trapezius muscle halfway between the inflection point of the neck, and the acromion. The second injection site was one half way between the acromion and the first injection site. The third injection was done between the first injection site and the inflection point of the neck.   Will return for repeat injection in 3 months.   A 200 unit sof Botox was used, any Botox not injected was wasted. The patient tolerated the procedure well, there were no complications of the above procedure.

## 2018-11-15 NOTE — Telephone Encounter (Signed)
3 mo btx

## 2018-11-15 NOTE — Progress Notes (Signed)
Botox- 100 units x 2 vials Lot: [id number] Expiration: 05/22 NDC: 0172-0910-68  Bacteriostatic 0.9% Sodium Chloride- 61mL total Lot: [id number] Expiration: 09/15/2019 NDC: 9409-8286-75  Dx: V98.242 B/B

## 2018-11-15 NOTE — Telephone Encounter (Signed)
Medicare/bank[employer] of life order sent to [facility]. No auth they will reach out to the pt to schedule.

## 2018-11-19 NOTE — Telephone Encounter (Signed)
I called the patient to reschedule but she did not answer so I left a VM asking her to call me back.

## 2018-11-26 NOTE — Telephone Encounter (Signed)
Patient called back and requested to speak to someone about where to get her aimovig. Please call and advise.

## 2018-11-29 NOTE — Telephone Encounter (Signed)
Called pt and LVM (ok per DPR) informing pt that [employer] should be reaching out to her to schedule the shipment as they will be providing the Enders. RN suggested pt reach out to them @ [phone] and to also check to see is she received any messages from them. Left office number for call back if needed.

## 2018-12-03 DIAGNOSIS — D519 Vitamin B12 deficiency anemia, unspecified: Secondary | ICD-10-CM | POA: Diagnosis not present

## 2018-12-09 ENCOUNTER — Ambulatory Visit: Payer: Medicare Other

## 2018-12-10 LAB — CUP PACEART REMOTE DEVICE CHECK
Date Time Interrogation Session: 20191227013957
Implantable Pulse Generator Implant Date: 20171206

## 2018-12-26 LAB — CUP PACEART REMOTE DEVICE CHECK
Date Time Interrogation Session: 20191124004038
Implantable Pulse Generator Implant Date: 20171206

## 2018-12-29 DIAGNOSIS — M79671 Pain in right foot: Secondary | ICD-10-CM | POA: Diagnosis not present

## 2018-12-29 DIAGNOSIS — M2540 Effusion, unspecified joint: Secondary | ICD-10-CM | POA: Diagnosis not present

## 2018-12-29 DIAGNOSIS — F909 Attention-deficit hyperactivity disorder, unspecified type: Secondary | ICD-10-CM | POA: Diagnosis not present

## 2018-12-29 DIAGNOSIS — Z79899 Other long term (current) drug therapy: Secondary | ICD-10-CM | POA: Diagnosis not present

## 2018-12-29 DIAGNOSIS — I10 Essential (primary) hypertension: Secondary | ICD-10-CM | POA: Diagnosis not present

## 2018-12-31 ENCOUNTER — Ambulatory Visit (INDEPENDENT_AMBULATORY_CARE_PROVIDER_SITE_OTHER): Payer: Medicare Other | Admitting: Neurology

## 2018-12-31 ENCOUNTER — Telehealth: Payer: Self-pay | Admitting: Neurology

## 2018-12-31 DIAGNOSIS — G243 Spasmodic torticollis: Secondary | ICD-10-CM

## 2018-12-31 NOTE — Progress Notes (Signed)
Dysport 500 units x 2 vials Lot: [id number] Expiration:05/15/2019 NDC: [address]-7  Bacteriostatic 0.9% Sodium Chloride- 40mL total Lot: [id number] Expiration: 09/15/2019 NDC: 9030-1499-69  Dx: G24.3 B/B

## 2018-12-31 NOTE — Telephone Encounter (Signed)
3 mo Dysport inj

## 2019-01-03 ENCOUNTER — Telehealth: Payer: Self-pay | Admitting: Neurology

## 2019-01-03 NOTE — Progress Notes (Signed)
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 She has significant pain, decreased ROM which is chronic for over one year. She gets regular massage. We have performed multiple trigger point injections with Depo-Medrol, lidocaine and Marcaine. She has tried baclofen, Cymbalta, oxycodone, Lyrica, gabapentin, Robaxin without any relief.   She has received excellent results from dysport therapy, > 50% improvement in pain and range of motion. Will increase dosage today for better pain management and increased ROM.   Procedure note for Spasmodic torticollis:  EMG: EMG guidance was used to inject muscles detailed below. Aseptic procedure was performed and patient tolerated procedure. Procedure was performed by Dr. Myrla Halsted   Patient tolerated the procedure.   Refractory to oral medications   Motrin / tylenol for injections site pain / soreness   REMS precautions handout given to patient   RTC - see instructions for details   Wasted 0 Units.    DX: G24.3 [employer]  Muscles injected:Muscles injected:  90 right levator scapula.  520 righttrapezius, 150 right semispinalis capitus, 150right splenius capitus, 90 left trapezius,

## 2019-01-03 NOTE — Telephone Encounter (Signed)
PA completed through cover my meds, [employer]. KEY: [id number] Will wait for response

## 2019-01-05 NOTE — Telephone Encounter (Signed)
I called and scheduled the patient. DW

## 2019-01-05 NOTE — Telephone Encounter (Signed)
PA was denied by [facility] for the medication  Through medicare guidelines. Per previous office note appeared the patient was getting it from another source due to not being covered. I did research for the patient this medication on goodrx.com and found the medication is cheapest 60 capsules/month at [facility] for $66.08.

## 2019-01-06 ENCOUNTER — Ambulatory Visit (INDEPENDENT_AMBULATORY_CARE_PROVIDER_SITE_OTHER): Payer: Medicare Other | Admitting: Podiatry

## 2019-01-06 ENCOUNTER — Ambulatory Visit (INDEPENDENT_AMBULATORY_CARE_PROVIDER_SITE_OTHER): Payer: Medicare Other | Admitting: Neurology

## 2019-01-06 ENCOUNTER — Encounter: Payer: Self-pay | Admitting: Podiatry

## 2019-01-06 ENCOUNTER — Other Ambulatory Visit: Payer: Self-pay | Admitting: Podiatry

## 2019-01-06 ENCOUNTER — Ambulatory Visit (INDEPENDENT_AMBULATORY_CARE_PROVIDER_SITE_OTHER): Payer: Medicare Other

## 2019-01-06 DIAGNOSIS — M7752 Other enthesopathy of left foot: Secondary | ICD-10-CM | POA: Diagnosis not present

## 2019-01-06 DIAGNOSIS — M79672 Pain in left foot: Secondary | ICD-10-CM | POA: Diagnosis not present

## 2019-01-06 DIAGNOSIS — G5603 Carpal tunnel syndrome, bilateral upper limbs: Secondary | ICD-10-CM | POA: Diagnosis not present

## 2019-01-06 DIAGNOSIS — M779 Enthesopathy, unspecified: Secondary | ICD-10-CM | POA: Diagnosis not present

## 2019-01-06 DIAGNOSIS — M79671 Pain in right foot: Secondary | ICD-10-CM

## 2019-01-06 DIAGNOSIS — R202 Paresthesia of skin: Secondary | ICD-10-CM | POA: Diagnosis not present

## 2019-01-06 DIAGNOSIS — M7751 Other enthesopathy of right foot: Secondary | ICD-10-CM

## 2019-01-06 DIAGNOSIS — M1000 Idiopathic gout, unspecified site: Secondary | ICD-10-CM | POA: Diagnosis not present

## 2019-01-06 DIAGNOSIS — R790 Abnormal level of blood mineral: Secondary | ICD-10-CM

## 2019-01-06 DIAGNOSIS — R200 Anesthesia of skin: Secondary | ICD-10-CM | POA: Diagnosis not present

## 2019-01-06 DIAGNOSIS — Z0289 Encounter for other administrative examinations: Secondary | ICD-10-CM

## 2019-01-06 MED ORDER — TRIAMCINOLONE ACETONIDE 10 MG/ML IJ SUSP
10.0000 mg | Freq: Once | INTRAMUSCULAR | Status: AC
  Administered 2019-01-06: 10 mg

## 2019-01-06 NOTE — Patient Instructions (Signed)

## 2019-01-06 NOTE — Progress Notes (Signed)
See procedure note.

## 2019-01-07 NOTE — Progress Notes (Signed)
Subjective:   Patient ID: [name], female   DOB: 73 y.o.   MRN: [id number]   HPI Patient presents stating that she is been getting a lot of pain in her forefoot right and her ankle left and was given a tentative diagnosis of gout that she is not sure about and has not been put on any medication and states that the pain was never acute redness or pain but has been sore for the last few weeks.  Patient did have apparently blood work done and had elevated uric acid but is not sure   ROS      Objective:  Physical Exam  Neurovascular status intact with quite a bit of inflammation of the second MPJ right with fluid buildup and inflammation of the left ankle with fluid buildup with no indications of acute redness or swelling     Assessment:  Possibility that this may be gout or it may be a inflammatory capsulitis condition bilateral     Plan:  H&P x-ray reviewed and today I did inject the second MPJ right 1 mg dexamethasone 1 mg Kenalog and the left ankle after sterile prep 3 mg Kenalog to the ankle joint and I want to see back again in several weeks and I gave instructions to get the blood work so I can review it and we will decide whether or not long-term medication is necessary.  Patient also at this time was given instructions on gout and I educated her on gout and its causes  X-rays were negative for signs of advanced arthritis stress fracture with moderate osteoporosis noted bilateral

## 2019-01-09 NOTE — Progress Notes (Addendum)
Full Name: Hadlyn Boyadjian Gender: Female MRN #: [id number] Date of Birth: Feb 20, 1946    Visit Date: 01/06/2019 08:19 Age: 73 Years 33 Months Old Examining Physician: Sarina Ill, MD   History: Patient with bilateral hand sensory changes  Summary: Nerve Conduction Studies were performed on the bilateral upper extremities.  The right Median 2nd Digit orthodromic sensory nerve showed borderline distal peak latency (3.78ms, N<3.4) and reduced amplitude(8 uV, N>10). The left  Median 2nd Digit orthodromic sensory nerve showed prolonged distal peak latency (3.6 ms, N<3.4). The right median/ulnar (palm) comparison nerve showed prolonged distal peak latency (Median Palm, 2.4 ms, N<2.2) and abnormal peak latency difference (Median Palm-Ulnar Palm, 0.5 ms, N<0.4) with a relative median delay.  The left median/ulnar (palm) comparison nerve showed prolonged distal peak latency (Median Palm, 2.4 ms, N<2.2) and abnormal peak latency difference (Median Palm-Ulnar Palm, 0.5 ms, N<0.4) with a relative median delay.  All remaining nerves (as indicated in the following tables) were within normal limits. All muscles (as indicated in the following tables) were within normal limits.   Conclusion: There is electrophysiologic evidence of bilateral mild Carpal Tunnel Syndrome.  No suggestion of polyneuropathy or radiculopathy.   Sarina Ill,  M.D.  [facility] [place], [place] [address] Tel: [phone] Fax: [phone]        [facility]    Nerve / Sites Muscle Latency Ref. Amplitude Ref. Rel Amp Segments Distance Velocity Ref. Area    ms ms mV mV %  cm m/s m/s mVms  R Median - APB     Wrist APB 3.3 ?4.4 5.8 ?4.0 100 Wrist - APB 7   21.0     Upper arm APB 7.4  5.3  91 Upper arm - Wrist 22 53 ?49 20.2  L Median - APB     Wrist APB 3.7 ?4.4 6.0 ?4.0 100 Wrist - APB 7   16.9     Upper arm APB 8.0  5.6  93.3 Upper arm - Wrist 22 52 ?49 17.7  R Ulnar - ADM     Wrist ADM 2.6 ?3.3  11.1 ?6.0 100 Wrist - ADM 7   27.9     B.Elbow ADM 5.9  10.3  92.4 B.Elbow - Wrist 18 54 ?49 26.8     A.Elbow ADM 7.9  10.1  98.5 A.Elbow - B.Elbow 10 51 ?49 25.8         A.Elbow - Wrist      L Ulnar - ADM     Wrist ADM 2.3 ?3.3 12.2 ?6.0 100 Wrist - ADM 7   33.9     B.Elbow ADM 5.4  10.6  86.8 B.Elbow - Wrist 17 54 ?49 31.1     A.Elbow ADM 7.3  10.5  99.2 A.Elbow - B.Elbow 10 53 ?49 29.9         A.Elbow - Wrist                 SNC    Nerve / Sites Rec. Site Peak Lat Ref.  Amp Ref. Segments Distance Peak Diff Ref.    ms ms V V  cm ms ms  L Median, Ulnar - Transcarpal comparison     Median Palm Wrist 2.4 ?2.2 44 ?35 Median Palm - Wrist 8       Ulnar Palm Wrist 2.0 ?2.2 17 ?12 Ulnar Palm - Wrist 8          Median Palm - [employer]  0.5 ?0.4  R Median, Ulnar - Transcarpal comparison     Median Palm Wrist 2.4 ?2.2 41 ?35 Median Palm - Wrist 8       Ulnar Palm Wrist 2.0 ?2.2 17 ?12 Ulnar Palm - Wrist 8          Median Palm - Ulnar Palm  0.5 ?0.4  R Median - Orthodromic (Dig II, Mid palm)     Dig II Wrist 3.4 ?3.4 8 ?10 Dig II - Wrist 13    L Median - Orthodromic (Dig II, Mid palm)     Dig II Wrist 3.6 ?3.4 11 ?10 Dig II - Wrist 13    R Ulnar - Orthodromic, (Dig V, Mid palm)     Dig V Wrist 3.0 ?3.1 5 ?5 Dig V - Wrist 11    L Ulnar - Orthodromic, (Dig V, Mid palm)     Dig V Wrist 3.1 ?3.1 6 ?5 Dig V - Wrist 47                   F  Wave    Nerve F Lat Ref.   ms ms  R Ulnar - ADM 26.9 ?32.0  L Ulnar - ADM 27.4 ?32.0         EMG full       EMG Summary Table    Spontaneous MUAP Recruitment  Muscle IA Fib PSW Fasc Other Amp Dur. Poly Pattern  L. Deltoid Normal None None None _______ Normal Normal Normal Normal  L. Triceps brachii Normal None None None _______ Normal Normal Normal Normal  R. Deltoid Normal None None None _______ Normal Normal Normal Normal  R. Triceps brachii Normal None None None _______ Normal Normal Normal Normal  L. Pronator teres Normal None None None  _______ Normal Normal Normal Normal  R. Pronator teres Normal None None None _______ Normal Normal Normal Normal  L. First dorsal interosseous Normal None None None _______ Normal Normal Normal Normal  R. First dorsal interosseous Normal None None None _______ Normal Normal Normal Normal  L. Opponens pollicis Normal None None None _______ Normal Normal Normal Normal  R. Opponens pollicis Normal None None None _______ Normal Normal Normal Normal  L. Cervical paraspinals (low) Normal None None None _______ Normal Normal Normal Normal  R. Cervical paraspinals (low) Normal None None None _______ Normal Normal Normal Normal

## 2019-01-09 NOTE — Progress Notes (Signed)
Patient with bilateral pain, weakness and sensory changes in the hands. She has chronic neck pain but no radicular symptoms. EMG/NCS today performed showed bilateral Carpal Tunnel Syndrome. Discussed pathophysiology and etiologies, conservative and invasive options for treatment. Will refer to Dr. Amedeo Plenty also advised on wrist splints and other conservative measures. She also has low Ferritin and RLS, she recently had an infusion where iron was very low 26 will recheck. Will send to Dr. Amedeo Plenty for evaluation of CTS release or injections.  A total of 15 minutes was spent face-to-face with this patient. Over half this time was spent on counseling patient on the  1. Bilateral carpal tunnel syndrome   2. Low ferritin     diagnosis and different diagnostic and therapeutic options, counseling and coordination of care, risks ans benefits of management, compliance, or risk factor reduction and education.  This does not include time spent on emg/ncs procedures.

## 2019-01-10 NOTE — Addendum Note (Signed)
Addended by: Sarina Ill B on: 01/10/2019 09:20 AM   Modules accepted: Orders

## 2019-01-10 NOTE — Procedures (Signed)
Full Name: [name] Gender: Female MRN #: [id number] Date of Birth: 1946-12-03    Visit Date: 01/06/2019 08:19 Age: 73 Years 20 Months Old Examining Physician: Sarina Ill, MD   History: Patient with bilateral hand sensory changes  Summary: Nerve Conduction Studies were performed on the bilateral upper extremities.  The right Median 2nd Digit orthodromic sensory nerve showed borderline distal peak latency (3.18ms, N<3.4) and reduced amplitude(8 uV, N>10). The left  Median 2nd Digit orthodromic sensory nerve showed prolonged distal peak latency (3.6 ms, N<3.4). The right median/ulnar (palm) comparison nerve showed prolonged distal peak latency (Median Palm, 2.4 ms, N<2.2) and abnormal peak latency difference (Median Palm-Ulnar Palm, 0.5 ms, N<0.4) with a relative median delay.  The left median/ulnar (palm) comparison nerve showed prolonged distal peak latency (Median Palm, 2.4 ms, N<2.2) and abnormal peak latency difference (Median Palm-Ulnar Palm, 0.5 ms, N<0.4) with a relative median delay.  All remaining nerves (as indicated in the following tables) were within normal limits. All muscles (as indicated in the following tables) were within normal limits.   Conclusion: There is electrophysiologic evidence of bilateral mild Carpal Tunnel Syndrome.  No suggestion of polyneuropathy or radiculopathy.   Sarina Ill,  M.D.  [facility] [place], [place] [address] Tel: [phone] Fax: [phone]        [facility]    Nerve / Sites Muscle Latency Ref. Amplitude Ref. Rel Amp Segments Distance Velocity Ref. Area    ms ms mV mV %  cm m/s m/s mVms  R Median - APB     Wrist APB 3.3 ?4.4 5.8 ?4.0 100 Wrist - APB 7   21.0     Upper arm APB 7.4  5.3  91 Upper arm - Wrist 22 53 ?49 20.2  L Median - APB     Wrist APB 3.7 ?4.4 6.0 ?4.0 100 Wrist - APB 7   16.9     Upper arm APB 8.0  5.6  93.3 Upper arm - Wrist 22 52 ?49 17.7  R Ulnar - ADM     Wrist ADM 2.6 ?3.3  11.1 ?6.0 100 Wrist - ADM 7   27.9     B.Elbow ADM 5.9  10.3  92.4 B.Elbow - Wrist 18 54 ?49 26.8     A.Elbow ADM 7.9  10.1  98.5 A.Elbow - B.Elbow 10 51 ?49 25.8         A.Elbow - Wrist      L Ulnar - ADM     Wrist ADM 2.3 ?3.3 12.2 ?6.0 100 Wrist - ADM 7   33.9     B.Elbow ADM 5.4  10.6  86.8 B.Elbow - Wrist 17 54 ?49 31.1     A.Elbow ADM 7.3  10.5  99.2 A.Elbow - B.Elbow 10 53 ?49 29.9         A.Elbow - Wrist                 SNC    Nerve / Sites Rec. Site Peak Lat Ref.  Amp Ref. Segments Distance Peak Diff Ref.    ms ms V V  cm ms ms  L Median, Ulnar - Transcarpal comparison     Median Palm Wrist 2.4 ?2.2 44 ?35 Median Palm - Wrist 8       Ulnar Palm Wrist 2.0 ?2.2 17 ?12 Ulnar Palm - Wrist 8          Median Palm - [employer]  0.5 ?0.4  R Median, Ulnar - Transcarpal comparison     Median Palm Wrist 2.4 ?2.2 41 ?35 Median Palm - Wrist 8       Ulnar Palm Wrist 2.0 ?2.2 17 ?12 Ulnar Palm - Wrist 8          Median Palm - Ulnar Palm  0.5 ?0.4  R Median - Orthodromic (Dig II, Mid palm)     Dig II Wrist 3.4 ?3.4 8 ?10 Dig II - Wrist 13    L Median - Orthodromic (Dig II, Mid palm)     Dig II Wrist 3.6 ?3.4 11 ?10 Dig II - Wrist 13    R Ulnar - Orthodromic, (Dig V, Mid palm)     Dig V Wrist 3.0 ?3.1 5 ?5 Dig V - Wrist 11    L Ulnar - Orthodromic, (Dig V, Mid palm)     Dig V Wrist 3.1 ?3.1 6 ?5 Dig V - Wrist 18                   F  Wave    Nerve F Lat Ref.   ms ms  R Ulnar - ADM 26.9 ?32.0  L Ulnar - ADM 27.4 ?32.0         EMG full       EMG Summary Table    Spontaneous MUAP Recruitment  Muscle IA Fib PSW Fasc Other Amp Dur. Poly Pattern  L. Deltoid Normal None None None _______ Normal Normal Normal Normal  L. Triceps brachii Normal None None None _______ Normal Normal Normal Normal  R. Deltoid Normal None None None _______ Normal Normal Normal Normal  R. Triceps brachii Normal None None None _______ Normal Normal Normal Normal  L. Pronator teres Normal None None None  _______ Normal Normal Normal Normal  R. Pronator teres Normal None None None _______ Normal Normal Normal Normal  L. First dorsal interosseous Normal None None None _______ Normal Normal Normal Normal  R. First dorsal interosseous Normal None None None _______ Normal Normal Normal Normal  L. Opponens pollicis Normal None None None _______ Normal Normal Normal Normal  R. Opponens pollicis Normal None None None _______ Normal Normal Normal Normal  L. Cervical paraspinals (low) Normal None None None _______ Normal Normal Normal Normal  R. Cervical paraspinals (low) Normal None None None _______ Normal Normal Normal Normal

## 2019-01-11 ENCOUNTER — Ambulatory Visit (INDEPENDENT_AMBULATORY_CARE_PROVIDER_SITE_OTHER): Payer: Medicare Other

## 2019-01-11 DIAGNOSIS — I639 Cerebral infarction, unspecified: Secondary | ICD-10-CM | POA: Diagnosis not present

## 2019-01-12 NOTE — Progress Notes (Signed)
Carelink Summary Report / Loop Recorder 

## 2019-01-13 ENCOUNTER — Encounter: Payer: Self-pay | Admitting: Podiatry

## 2019-01-13 ENCOUNTER — Ambulatory Visit (INDEPENDENT_AMBULATORY_CARE_PROVIDER_SITE_OTHER): Payer: Medicare Other | Admitting: Podiatry

## 2019-01-13 DIAGNOSIS — M779 Enthesopathy, unspecified: Secondary | ICD-10-CM

## 2019-01-13 DIAGNOSIS — I639 Cerebral infarction, unspecified: Secondary | ICD-10-CM | POA: Diagnosis not present

## 2019-01-13 DIAGNOSIS — M1000 Idiopathic gout, unspecified site: Secondary | ICD-10-CM | POA: Diagnosis not present

## 2019-01-13 LAB — CUP PACEART REMOTE DEVICE CHECK
Date Time Interrogation Session: 20200129013543
Implantable Pulse Generator Implant Date: 20171206

## 2019-01-13 MED ORDER — TRIAMCINOLONE ACETONIDE 10 MG/ML IJ SUSP
10.0000 mg | Freq: Once | INTRAMUSCULAR | Status: AC
  Administered 2019-01-13: 10 mg

## 2019-01-14 DIAGNOSIS — M7541 Impingement syndrome of right shoulder: Secondary | ICD-10-CM | POA: Diagnosis not present

## 2019-01-14 DIAGNOSIS — M25562 Pain in left knee: Secondary | ICD-10-CM | POA: Diagnosis not present

## 2019-01-14 NOTE — Progress Notes (Signed)
Subjective:   Patient ID: [name], female   DOB: 73 y.o.   MRN: [id number]   HPI Patient states the right foot has improved a lot with medication and the left foot is painful around the metatarsals and I am still not sure if I have gout and I did bring my blood work today for review   ROS      Objective:  Physical Exam  Neurovascular status unchanged with slight elevation of the uric acid level upon blood work evaluation with inflammation around the second and third MPJ left and the right improved quite a bit with previous treatment     Assessment:  Difficult to say between inflammatory capsulitis or gout-like symptomatology     Plan:  Reviewed condition and I have recommended that we treat the left foot and if symptoms were to persist will get a need to consider oral medication for gout.  At this point I did do a sterile prep of the foot left and I injected the second MPJ in a periarticular fashion 3 mg dexamethasone Kenalog 5 mg Xylocaine advised on rigid bottom shoes and soaks and reappoint if symptoms persist or come back again

## 2019-02-10 ENCOUNTER — Encounter: Payer: Self-pay | Admitting: Interventional Cardiology

## 2019-02-10 ENCOUNTER — Ambulatory Visit (INDEPENDENT_AMBULATORY_CARE_PROVIDER_SITE_OTHER): Payer: Medicare Other | Admitting: Interventional Cardiology

## 2019-02-10 VITALS — BP 128/80 | HR 75 | Ht 67.0 in | Wt 242.2 lb

## 2019-02-10 DIAGNOSIS — N63 Unspecified lump in unspecified breast: Secondary | ICD-10-CM | POA: Diagnosis not present

## 2019-02-10 DIAGNOSIS — M1990 Unspecified osteoarthritis, unspecified site: Secondary | ICD-10-CM | POA: Diagnosis not present

## 2019-02-10 DIAGNOSIS — E7801 Familial hypercholesterolemia: Secondary | ICD-10-CM

## 2019-02-10 DIAGNOSIS — G243 Spasmodic torticollis: Secondary | ICD-10-CM | POA: Diagnosis not present

## 2019-02-10 DIAGNOSIS — M109 Gout, unspecified: Secondary | ICD-10-CM | POA: Diagnosis not present

## 2019-02-10 DIAGNOSIS — Q245 Malformation of coronary vessels: Secondary | ICD-10-CM | POA: Diagnosis not present

## 2019-02-10 DIAGNOSIS — I480 Paroxysmal atrial fibrillation: Secondary | ICD-10-CM

## 2019-02-10 DIAGNOSIS — R300 Dysuria: Secondary | ICD-10-CM | POA: Diagnosis not present

## 2019-02-10 DIAGNOSIS — I639 Cerebral infarction, unspecified: Secondary | ICD-10-CM

## 2019-02-10 DIAGNOSIS — R0989 Other specified symptoms and signs involving the circulatory and respiratory systems: Secondary | ICD-10-CM | POA: Diagnosis not present

## 2019-02-10 DIAGNOSIS — G4733 Obstructive sleep apnea (adult) (pediatric): Secondary | ICD-10-CM | POA: Diagnosis not present

## 2019-02-10 DIAGNOSIS — F900 Attention-deficit hyperactivity disorder, predominantly inattentive type: Secondary | ICD-10-CM | POA: Diagnosis not present

## 2019-02-10 NOTE — Progress Notes (Signed)
Cardiology Office Note:    Date:  02/10/2019   ID:  [name], DOB [birth year], MRN [id number]  PCP:  [name], MD  Cardiologist:  Sinclair Grooms, MD   Referring MD: [name], MD   Chief Complaint  Patient presents with  . Advice Only  . Heart Murmur    History of Present Illness:    [name] is a 73 y.o. female with a hx of obstructive sleep apnea, severe hyperlipidemia, anomalous origin of the right coronary, secondary pulmonary hypertension, lower extremity swelling, recent CVA.  Loop recorder implantation to exclude A. fib, has not demonstrated arrhythmia since implantation.  She is doing okay.  She denies orthopnea, PND, but does have palpitations.  She had to stop PCSK9 therapy because of cost.  She now wants to return to therapy.  She has a lot of concern about many things.  She says that she has had 2 strokes in the past suggested by a brain scan but no clinical admissions or instances compatible with the diagnosis.  Her feet hurt and her legs ache when she walks.  It improves with rest.  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  Mild dyspnea on exertion.  Physically and active.  Loop recorder now for nearly 2 years has not revealed any evidence of atrial fibrillation.  Past Medical History:  Diagnosis Date  . 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 Arthritis    generalized  . Asthma   . Atrophic vaginitis   . Back pain    DDD/stenosis  . Carotid stenosis    Carotid US 10/16: Plaque RICA (1-32%), normal LICA  . Depression    takes Cymbalta daily  . Diverticulosis    benign  . DJD (degenerative joint disease)   . Dyslipidemia   . Dysrhythmia   . Family history of GI bleeding   . GERD (gastroesophageal reflux disease)    takes Nexium daily  . Gestational diabetes   . H/O hiatal hernia    surgery for hernia  . Headache(784.0)    takes Imitrex daily as needed and Bisoprolol  daily;last migraine was about 2wks ago  . History of bronchitis 1 yr ago  . History of shingles   . Insomnia    takes Ambien nightly  . Joint pain   . Joint swelling   . Leg cramps    takes Flexeril daily as needed  . 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 Nocturia   . Osteoporosis   . Peripheral neuropathy    takes Gabapentin daily  . Pneumonia 74yrs ago   hx of  . Restless leg syndrome    takes Requip daily  . RLS (restless legs syndrome) 08/16/2015  . Sleep apnea    uses CPAP  . Tubular adenoma of colon   . Vertigo   . Vitamin D deficiency     Past Surgical History:  Procedure Laterality Date  . COLONOSCOPY    . EP IMPLANTABLE DEVICE N/A 11/19/2016   Procedure: Loop Recorder Insertion;  Surgeon: Deboraha Sprang, MD;  Location: [facility];  Service: Cardiovascular;  Laterality: N/A;  . ESOPHAGOGASTRODUODENOSCOPY    . excess skin removal     post weight loss  . GASTRIC BYPASS  2001  . HERNIA REPAIR     umbilical  . JOINT REPLACEMENT Right    knee  . skin reduction  2003 2004  stomach and arm  . STEROID INJECTION TO SCAR     x 2 in back   . TOTAL KNEE ARTHROPLASTY Left 04/03/2015   Procedure: LEFT TOTAL KNEE ARTHROPLASTY;  Surgeon: Melrose Nakayama, MD;  Location: [facility];  Service: Orthopedics;  Laterality: Left;  . WISDOM TOOTH EXTRACTION      Current Medications: Current Meds  Medication Sig  . amphetamine-dextroamphetamine (ADDERALL) 10 MG tablet Take 1 tablet (10 mg total) by mouth daily before breakfast.  . baclofen (LIORESAL) 10 MG tablet Take 1 tablet (10 mg total) by mouth 2 (two) times daily.  . bisoprolol (ZEBETA) 10 MG tablet Take 10 mg by mouth 2 (two) times daily.  . calcium carbonate (OS-CAL) 1250 (500 CA) MG chewable tablet Chew 1 tablet by mouth daily. Not sure of dosage  . diclofenac sodium (VOLTAREN) 1 % GEL Apply 4 g topically 4 (four) times daily.  . DULoxetine (CYMBALTA) 60 MG capsule Take 60 mg by mouth daily after supper.  Eduard Roux  (AIMOVIG 140 DOSE) 70 MG/ML SOAJ Inject 2 pens into the skin every 30 (thirty) days.  Eduard Roux (AIMOVIG) 140 MG/ML SOAJ Inject 140 mg into the skin every 30 (thirty) days.  Marland Kitchen esomeprazole (NEXIUM) 40 MG capsule Take 40 mg by mouth 2 (two) times daily before a meal.   . Evolocumab (REPATHA SURECLICK) 130 MG/ML SOAJ Inject 1 pen into the skin every 14 (fourteen) days.  . furosemide (LASIX) 20 MG tablet Take 40 mg by mouth daily as needed for fluid or edema.  Marland Kitchen GABAPENTIN PO Take 300 mg by mouth 2 (two) times daily.   Marland Kitchen HYDROcodone-acetaminophen (NORCO) 10-325 MG tablet Take 1 tablet by mouth every 8 (eight) hours as needed for moderate pain or severe pain. Takes for migraines  . Loratadine (CLARITIN) 10 MG CAPS Take 1 capsule by mouth as needed (for allergies).   . ondansetron (ZOFRAN ODT) 4 MG disintegrating tablet Take 1 tablet (4 mg total) by mouth every 8 (eight) hours as needed for nausea or vomiting.  Marland Kitchen PROAIR HFA 108 (90 Base) MCG/ACT inhaler 2 INHALATIONS EVERY 4 HOURS AS NEEDED FOR ASTHMA SYMPTOMS (WHEEZING/SHORTNESS OF BREATH)  . quiNINE (QUALAQUIN) 324 MG capsule Take 1 capsule (324 mg total) by mouth 2 (two) times daily.  Marland Kitchen rOPINIRole (REQUIP) 1 MG tablet TAKE 1 TABLET(1 MG) BY MOUTH TWICE DAILY BEFORE LUNCH AND SUPPER  . SUMAtriptan (TOSYMRA) 10 MG/ACT SOLN Place 1 spray into the nose once for 1 dose.  . topiramate (TOPAMAX) 100 MG tablet Take 1 tablet (100 mg total) by mouth at bedtime.  . Vitamin D, Ergocalciferol, (DRISDOL) 50000 units CAPS capsule Take 1 capsule (50,000 Units total) by mouth every 7 (seven) days.  Marland Kitchen zolpidem (AMBIEN) 10 MG tablet      Allergies:   Patient has no known allergies.   Social History   Socioeconomic History  . Marital status: Married    Spouse name: Pilar Jarvis  . Number of children: 3  . Years of education: college  . Highest education level: Not on file  Occupational History  . Occupation: Retired  Scientific laboratory technician  . Financial resource  strain: Not on file  . Food insecurity:    Worry: Not on file    Inability: Not on file  . Transportation needs:    Medical: Not on file    Non-medical: Not on file  Tobacco Use  . Smoking status: Never Smoker  . Smokeless tobacco: Never Used  Substance and Sexual Activity  .  Alcohol use: No    Alcohol/week: 0.0 standard drinks  . Drug use: No  . Sexual activity: Yes    Birth control/protection: Post-menopausal  Lifestyle  . Physical activity:    Days per week: Not on file    Minutes per session: Not on file  . Stress: Not on file  Relationships  . Social connections:    Talks on phone: Not on file    Gets together: Not on file    Attends religious service: Not on file    Active member of club or organization: Not on file    Attends meetings of clubs or organizations: Not on file    Relationship status: Not on file  Other Topics Concern  . Not on file  Social History Narrative   Lives at home w/ her husband   Caffeine 8-10 cups daily.  (coffee 1 cup am, unsw tea all day long).      Family History: The patient's family history includes Breast cancer in her maternal grandmother; CAD in her mother and paternal grandfather; Colon cancer in her father; Heart disease in her mother; Hypertension in her mother; Neuropathy in her mother; Osteoarthritis in her mother.  ROS:   Please see the history of present illness.    Headaches, easy bruising, leg pain as noted above.  All other systems reviewed and are negative.  EKGs/Labs/Other Studies Reviewed:    The following studies were reviewed today: 2D Doppler echocardiogram April 2018: ------------------------------------------------------------------- Study Conclusions  - Left ventricle: The cavity size was normal. There was moderate   focal basal hypertrophy of the septum. Systolic function was   normal. The estimated ejection fraction was in the range of 60%   to 65%. Wall motion was normal; there were no regional wall    motion abnormalities. Doppler parameters are consistent with   abnormal left ventricular relaxation (grade 1 diastolic   dysfunction). The E/e&' ratio is between 8-15, suggesting   indeterminate LV filling pressure. - Aortic valve: Structurally normal valve. Trileaflet.   Transvalvular velocity was minimally increased. There was no   stenosis. There was no regurgitation. - Left atrium: The atrium was normal in size. - Tricuspid valve: There was mild regurgitation. - Pulmonary arteries: PA peak pressure: 35 mm Hg (S). - Inferior vena cava: The vessel was normal in size. The   respirophasic diameter changes were in the normal range (>= 50%),   consistent with normal central venous pressure.  Impressions:  - Compared to a prior study in 2017, the LVEF is unchanged. RVSP is   lower at 35 mmHg.  EKG:  EKG normal sinus rhythm, prominent voltage, inferior Q wave, when compared to prior tracings, no significant changes noted.  Recent Labs: No results found for requested labs within last 8760 hours.  Recent Lipid Panel    Component Value Date/Time   CHOL 223 (H) 02/27/2016 1001   TRIG 143 02/27/2016 1001   HDL 48 02/27/2016 1001   CHOLHDL 4.6 (H) 02/27/2016 1001   LDLCALC 146 (H) 02/27/2016 1001    Physical Exam:    VS:  BP 128/80   Pulse 75   Ht 5\' 7"  (1.702 m)   Wt 242 lb 3.2 oz (109.9 kg)   SpO2 94%   BMI 37.93 kg/m     Wt Readings from Last 3 Encounters:  02/10/19 242 lb 3.2 oz (109.9 kg)  06/29/18 237 lb (107.5 kg)  05/25/18 233 lb (105.7 kg)     GEN: Morbid obesity. No acute  distress HEENT: Normal NECK: No JVD. LYMPHATICS: No lymphadenopathy CARDIAC: RRR.  2/6 left mid sternal systolic murmur with radiation to the left subclavicular and right subclavicular area.  Left lower sternal systolic murmur, no gallop, trace bilateral ankle edema VASCULAR: Absent pedal pulses, no bruits RESPIRATORY:  Clear to auscultation without rales, wheezing or rhonchi  ABDOMEN: Soft,  non-tender, non-distended, No pulsatile mass, MUSCULOSKELETAL: No deformity  SKIN: Warm and dry NEUROLOGIC:  Alert and oriented x 3 PSYCHIATRIC:  Normal affect   ASSESSMENT:    1. Paroxysmal atrial fibrillation (HCC)   2. Familial hypercholesterolemia   3. Obstructive sleep apnea   4. Morbid obesity ([place])   5. Diminished pulses in lower extremity   6. Congenital anomaly of coronary artery    PLAN:    In order of problems listed above:  1. No documentation of atrial fibrillation now greater than 24 months since implant of loop recorder. 2. 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  Resume PCSK9 therapy. 3. Compliance with sleep apparatus is recommended. 4. 150 minutes of moderate aerobic activity is recommended. 5. Bilateral lower extremity Doppler to rule out obstructive disease. 6. 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  Overall education and awareness concerning primary/secondary risk prevention was discussed in detail: LDL less than 70, hemoglobin A1c less than 7, blood pressure target less than 130/80 mmHg, >150 minutes of moderate aerobic activity per week, avoidance of smoking, weight control (via diet and exercise), and continued surveillance/management of/for obstructive sleep apnea.  Major goal should be to increase walking.  This would also help PAD if it is noted to be present.  Clinical follow-up in 1 year.  Earlier if significant abnormality noted on Doppler study.   Medication Adjustments/Labs and Tests Ordered: Current medicines are reviewed at length with the patient today.  Concerns regarding medicines are outlined above.  Orders Placed This Encounter  Procedures  . EKG 12-Lead   No orders of the defined types were placed in this encounter.   Patient Instructions  Medication Instructions:  Your physician recommends that you continue on your current medications as directed. Please refer to the Current  Medication list given to you today.  If you need a refill on your cardiac medications before your next appointment, please call your pharmacy.   Lab work: None If you have labs (blood work) drawn today and your tests are completely normal, you will receive your results only by: Marland Kitchen MyChart Message (if you have MyChart) OR . A paper copy in the mail If you have any lab test that is abnormal or we need to change your treatment, we will call you to review the results.  Testing/Procedures: Your physician has requested that you have a lower or upper extremity arterial duplex. This test is an ultrasound of the arteries in the legs or arms. It looks at arterial blood flow in the legs and arms. Allow one hour for Lower and Upper Arterial scans. There are no restrictions or special instructions  Follow-Up: At [facility], you and your health needs are our priority.  As part of our continuing mission to provide you with exceptional heart care, we have created designated Provider Care Teams.  These Care Teams include your primary Cardiologist (physician) and Advanced Practice Providers (APPs -  Physician Assistants and Nurse Practitioners) who all work together to provide you with the care you need, when you need it. You will need a follow up appointment in 12 months.  Please call  our office 2 months in advance to schedule this appointment.  You may see Sinclair Grooms, MD or one of the following Advanced Practice Providers on your designated Care Team:   Truitt Merle, NP Cecilie Kicks, NP . Kathyrn Drown, NP  Any Other Special Instructions Will Be Listed Below (If Applicable).  Contact Tana Coast, PharmD about Repatha     Signed, Sinclair Grooms, MD  02/10/2019 2:49 PM    [place]

## 2019-02-10 NOTE — Patient Instructions (Signed)
Medication Instructions:  Your physician recommends that you continue on your current medications as directed. Please refer to the Current Medication list given to you today.  If you need a refill on your cardiac medications before your next appointment, please call your pharmacy.   Lab work: None If you have labs (blood work) drawn today and your tests are completely normal, you will receive your results only by: Marland Kitchen MyChart Message (if you have MyChart) OR . A paper copy in the mail If you have any lab test that is abnormal or we need to change your treatment, we will call you to review the results.  Testing/Procedures: Your physician has requested that you have a lower or upper extremity arterial duplex. This test is an ultrasound of the arteries in the legs or arms. It looks at arterial blood flow in the legs and arms. Allow one hour for Lower and Upper Arterial scans. There are no restrictions or special instructions  Follow-Up: At [facility], you and your health needs are our priority.  As part of our continuing mission to provide you with exceptional heart care, we have created designated Provider Care Teams.  These Care Teams include your primary Cardiologist (physician) and Advanced Practice Providers (APPs -  Physician Assistants and Nurse Practitioners) who all work together to provide you with the care you need, when you need it. You will need a follow up appointment in 12 months.  Please call our office 2 months in advance to schedule this appointment.  You may see Sinclair Grooms, MD or one of the following Advanced Practice Providers on your designated Care Team:   Truitt Merle, NP Cecilie Kicks, NP . Kathyrn Drown, NP  Any Other Special Instructions Will Be Listed Below (If Applicable).  Contact Tana Coast, PharmD about Repatha

## 2019-02-11 ENCOUNTER — Other Ambulatory Visit: Payer: Self-pay | Admitting: Interventional Cardiology

## 2019-02-11 DIAGNOSIS — I739 Peripheral vascular disease, unspecified: Secondary | ICD-10-CM

## 2019-02-11 DIAGNOSIS — R0989 Other specified symptoms and signs involving the circulatory and respiratory systems: Secondary | ICD-10-CM

## 2019-02-14 ENCOUNTER — Ambulatory Visit (INDEPENDENT_AMBULATORY_CARE_PROVIDER_SITE_OTHER): Payer: Medicare Other | Admitting: *Deleted

## 2019-02-14 ENCOUNTER — Other Ambulatory Visit: Payer: Self-pay | Admitting: Family Medicine

## 2019-02-14 DIAGNOSIS — I639 Cerebral infarction, unspecified: Secondary | ICD-10-CM | POA: Diagnosis not present

## 2019-02-14 DIAGNOSIS — N6310 Unspecified lump in the right breast, unspecified quadrant: Secondary | ICD-10-CM

## 2019-02-14 LAB — CUP PACEART REMOTE DEVICE CHECK
Date Time Interrogation Session: 20200302013741
Implantable Pulse Generator Implant Date: 20171206

## 2019-02-16 ENCOUNTER — Ambulatory Visit
Admission: RE | Admit: 2019-02-16 | Discharge: 2019-02-16 | Disposition: A | Discharge status: Home / Self Care | Payer: Medicare Other | Source: Ambulatory Visit | Attending: Family Medicine | Admitting: Family Medicine

## 2019-02-16 ENCOUNTER — Other Ambulatory Visit: Payer: Self-pay | Admitting: Neurology

## 2019-02-16 DIAGNOSIS — N6310 Unspecified lump in the right breast, unspecified quadrant: Secondary | ICD-10-CM

## 2019-02-16 DIAGNOSIS — R928 Other abnormal and inconclusive findings on diagnostic imaging of breast: Secondary | ICD-10-CM | POA: Diagnosis not present

## 2019-02-16 DIAGNOSIS — Z23 Encounter for immunization: Secondary | ICD-10-CM | POA: Diagnosis not present

## 2019-02-16 DIAGNOSIS — N6489 Other specified disorders of breast: Secondary | ICD-10-CM | POA: Diagnosis not present

## 2019-02-16 IMAGING — MG DIGITAL DIAGNOSTIC BILATERAL MAMMOGRAM WITH TOMO AND CAD
6 of 10 series · 6 of 30 positions shown · non-contrast
Comparison: Previous exam(s).

ACR Breast Density Category a: The breast tissue is almost entirely
fatty.

CLINICAL DATA: 72-year-old female presenting for evaluation of a
palpable lump in the right breast.During her exam, the patient
stated she was not sure if the lump was in the medial right breast
or in the upper-outer right breast. Therefore both of these areas
were evaluated by ultrasound.

EXAM:
DIGITAL DIAGNOSTIC RIGHT MAMMOGRAM WITH CAD AND TOMO
ULTRASOUND RIGHT BREAST

[R CC synth-2D]
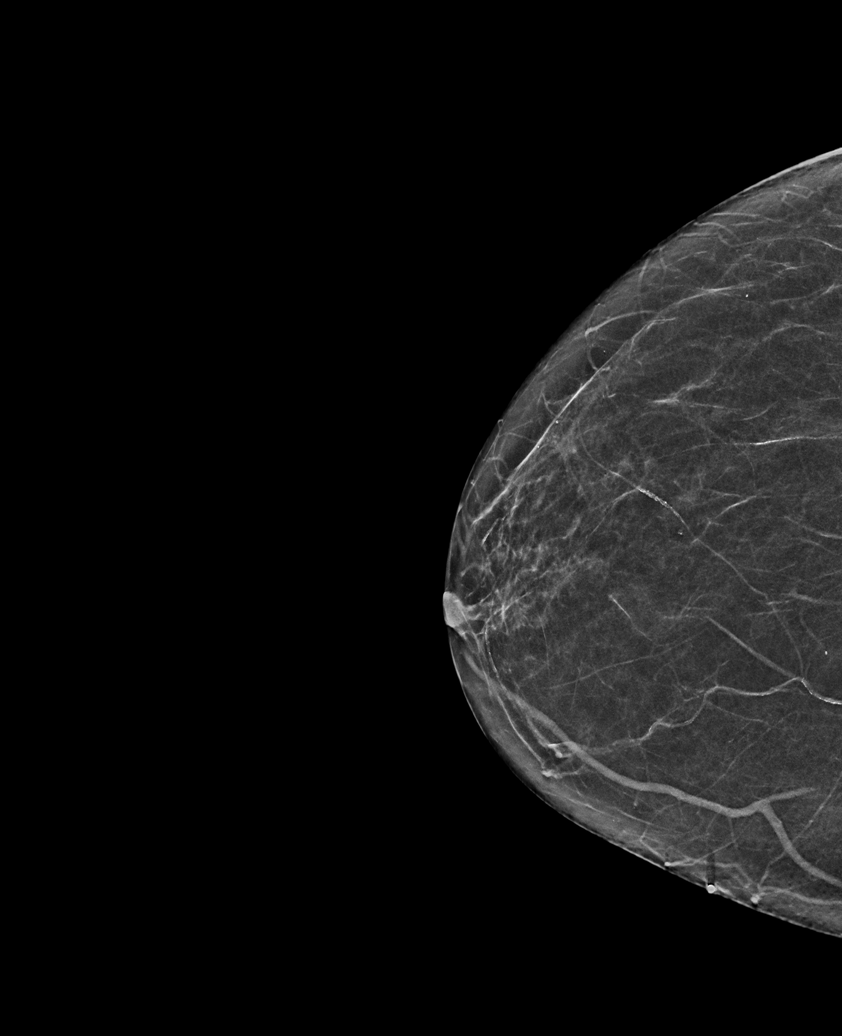

[R MLO synth-2D]
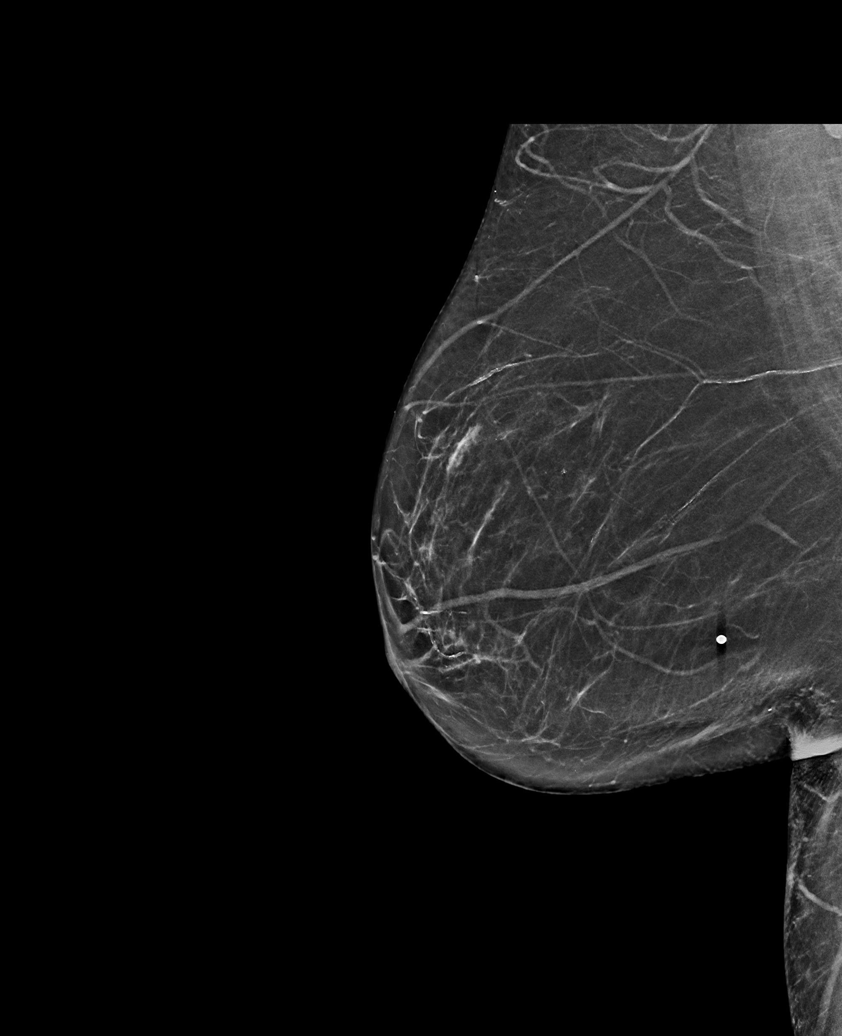

[L MLO synth-2D]
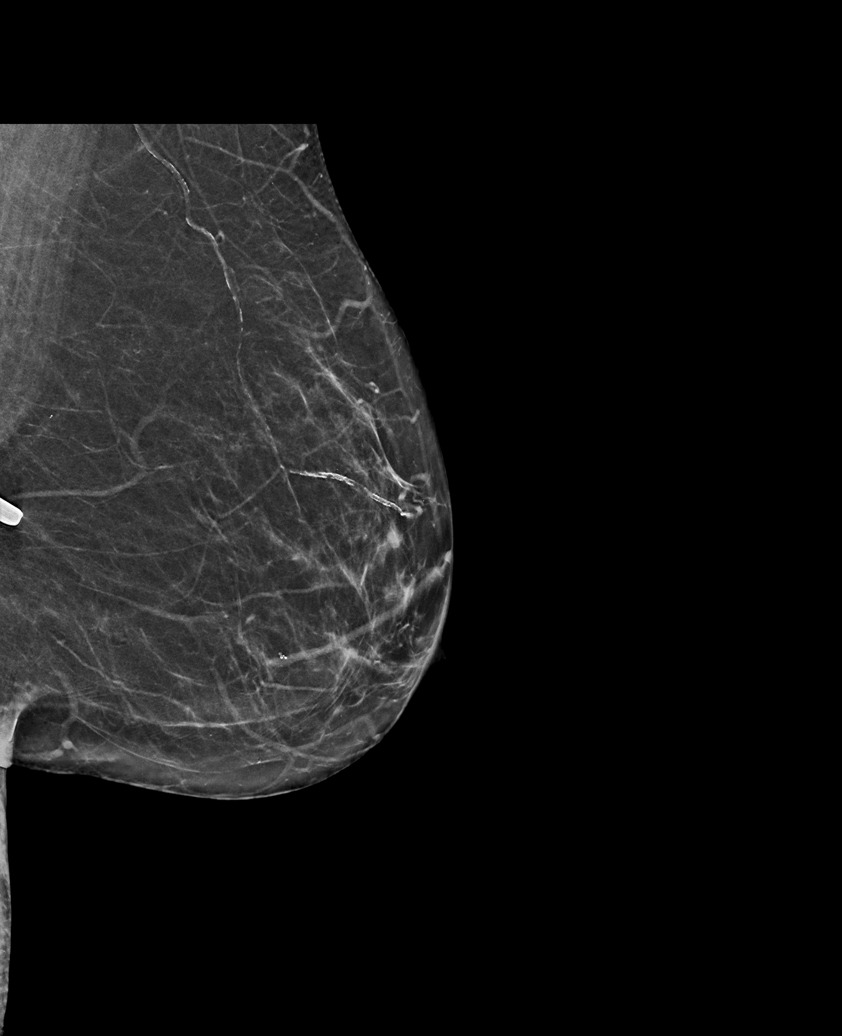

[R TAN synth-2D]
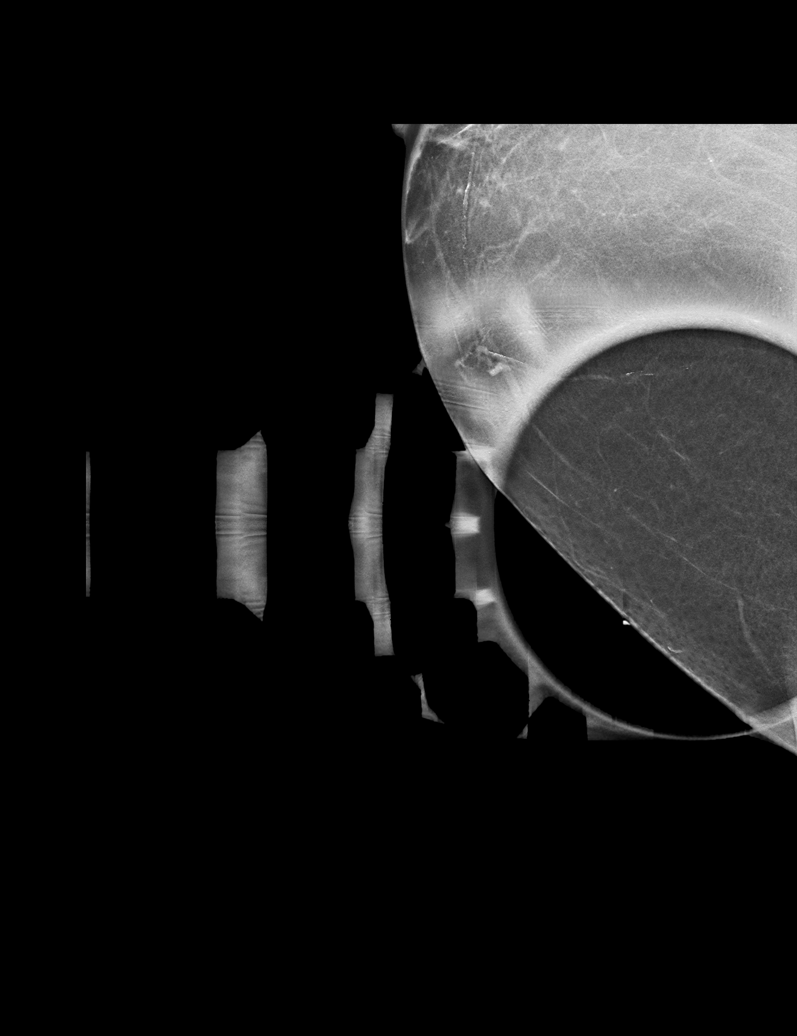

[L CC synth-2D]
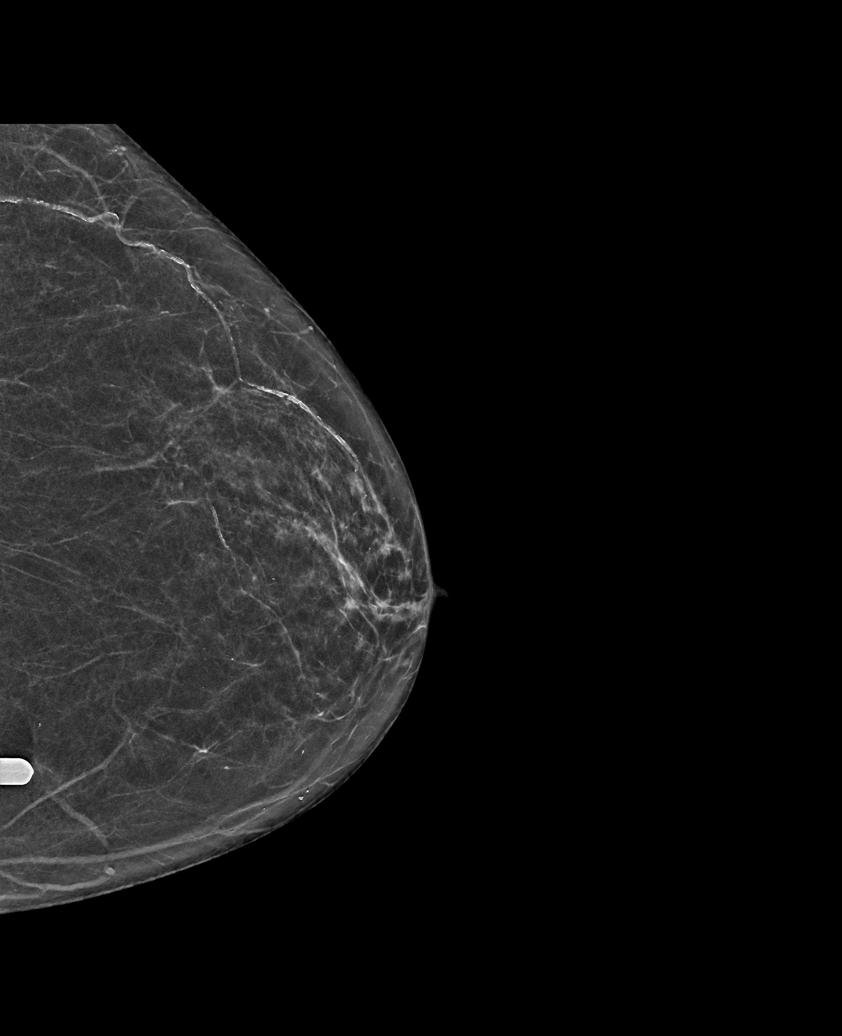

[R MLO tomo · tomo slice 33/65.0]
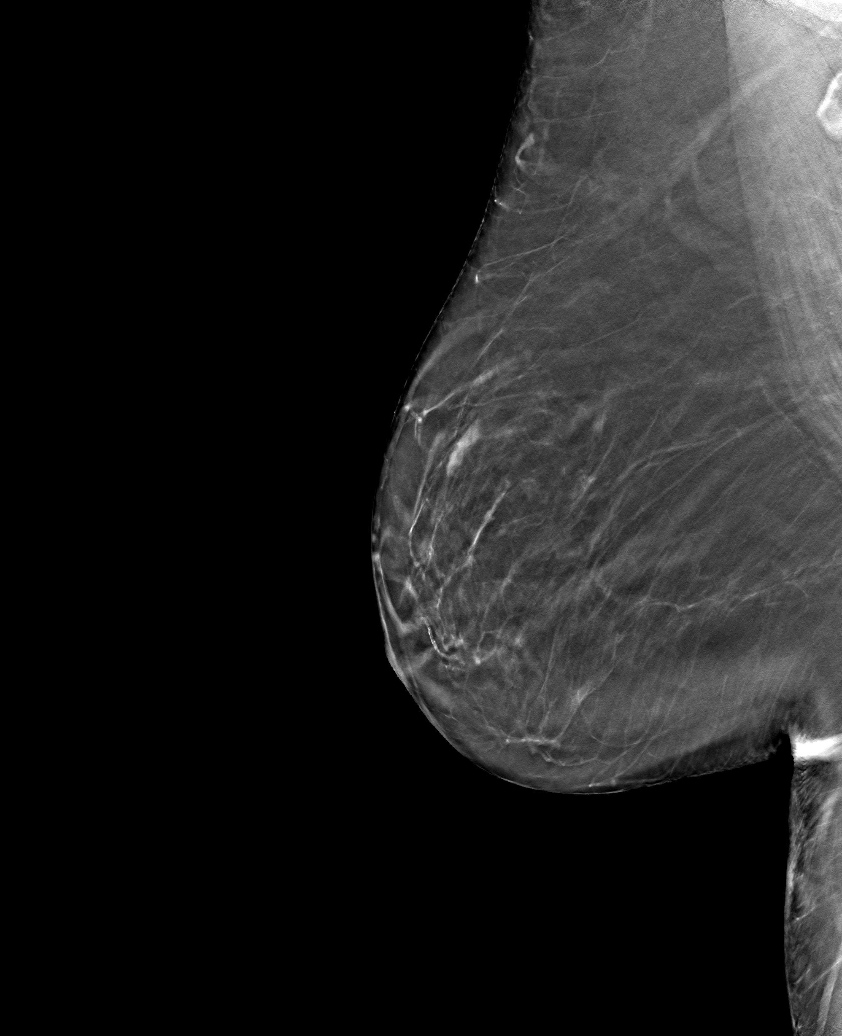

[6 of 30 positions shown; findings below may reference images not displayed]

FINDINGS: A BB has been placed on the medial aspect of the right breast
indicating the palpable site of concern. No suspicious masses are
identified deep to this palpable marker. No suspicious
calcifications, masses or areas of distortion are seen in the
bilateral breasts.

Mammographic images were processed with CAD.

On physical exam, no suspicious palpable masses are identified at
the site of concern in the medial or the upper-outer right breast.

Targeted ultrasound is performed, showing normal fibroglandular
tissue in the medial right breast. The upper-outer quadrant of the
right breast was also scanned demonstrating no suspicious masses or
areas of shadowing.
IMPRESSION: 1. There are no mammographic or targeted sonographic abnormalities
at the palpable site of concern in the right breast.

2.  No mammographic evidence of malignancy in the bilateral breasts.

RECOMMENDATION:
1. Clinical follow-up recommended for the palpable area of concern
in the right breast. Any further workup should be based on clinical
grounds.

2.  Screening mammogram in one year.(Code:[6K])

I have discussed the findings and recommendations with the patient.
Results were also provided in writing at the conclusion of the
visit. If applicable, a reminder letter will be sent to the patient
regarding the next appointment.

BI-RADS CATEGORY  1: Negative.

## 2019-02-16 IMAGING — US ULTRASOUND RIGHT BREAST LIMITED
1 series · 6 of 6 positions shown · non-contrast
Comparison: Previous exam(s).

ACR Breast Density Category a: The breast tissue is almost entirely
fatty.

CLINICAL DATA: 72-year-old female presenting for evaluation of a
palpable lump in the right breast.During her exam, the patient
stated she was not sure if the lump was in the medial right breast
or in the upper-outer right breast. Therefore both of these areas
were evaluated by ultrasound.

EXAM:
DIGITAL DIAGNOSTIC RIGHT MAMMOGRAM WITH CAD AND TOMO
ULTRASOUND RIGHT BREAST

[Series 1: ultrasound right breast limited · 0.07mm/px · 6 of 6 slices shown]
[im 1/6]
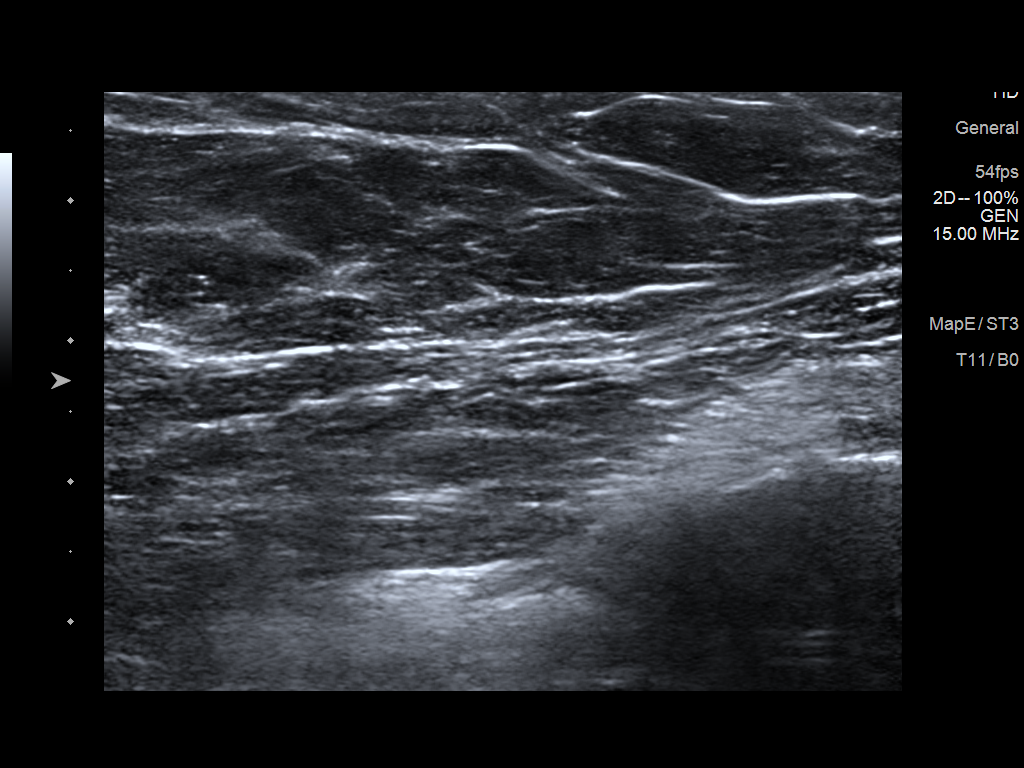
[im 2/6]
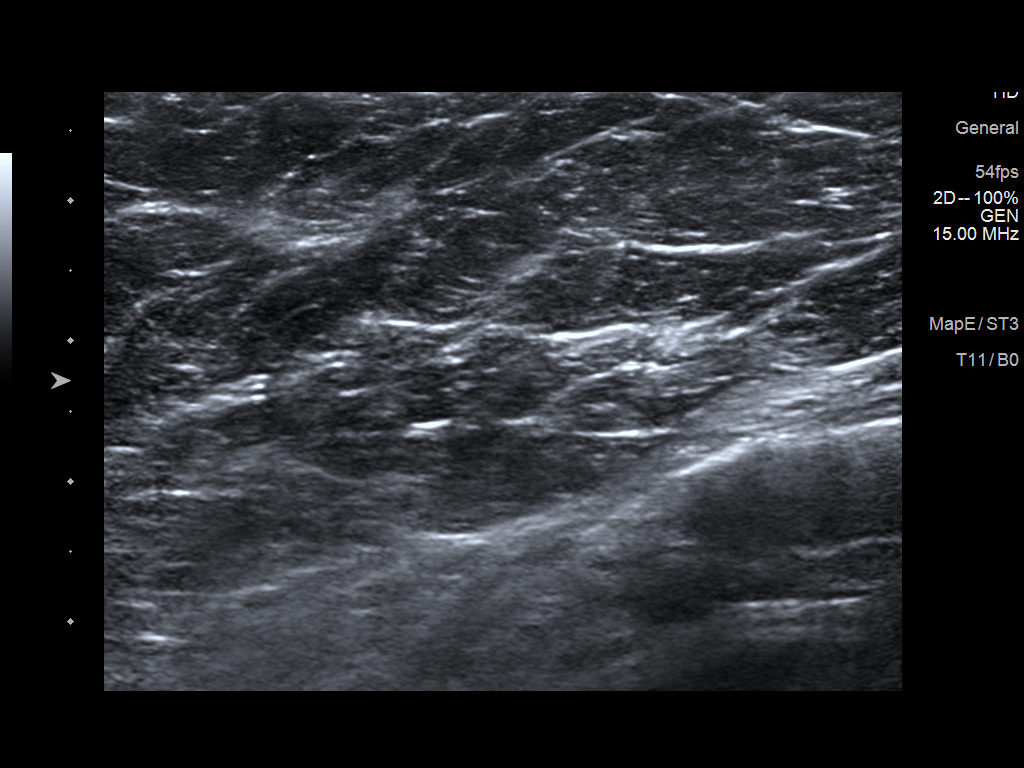
[im 3/6]
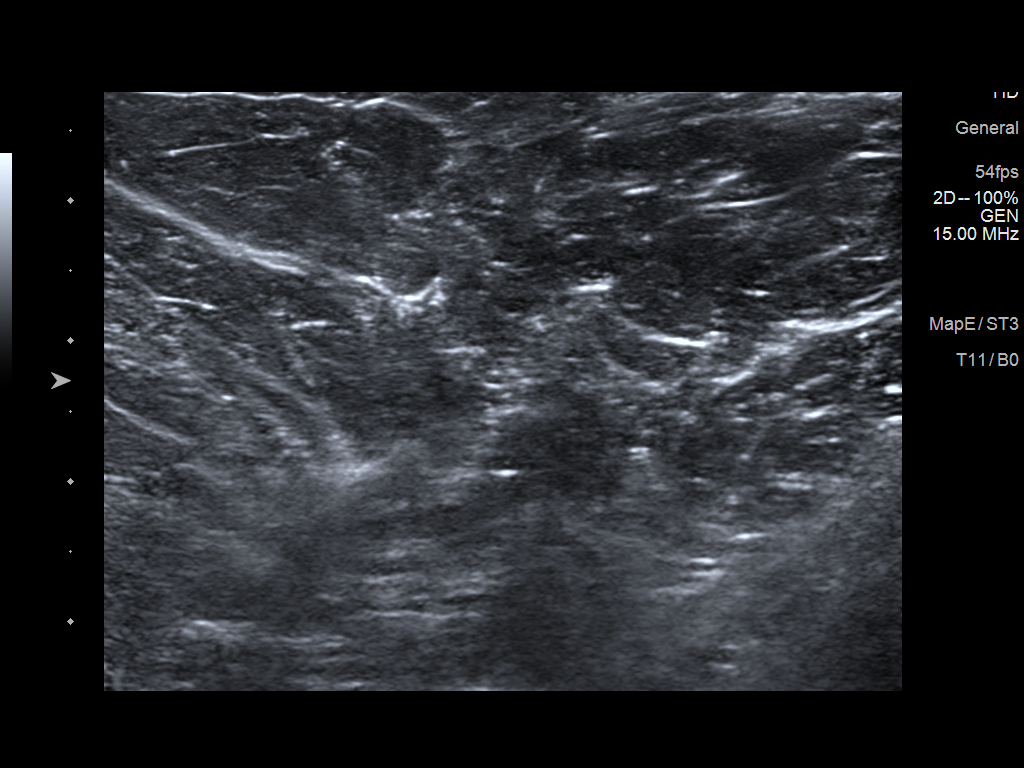
[im 4/6]
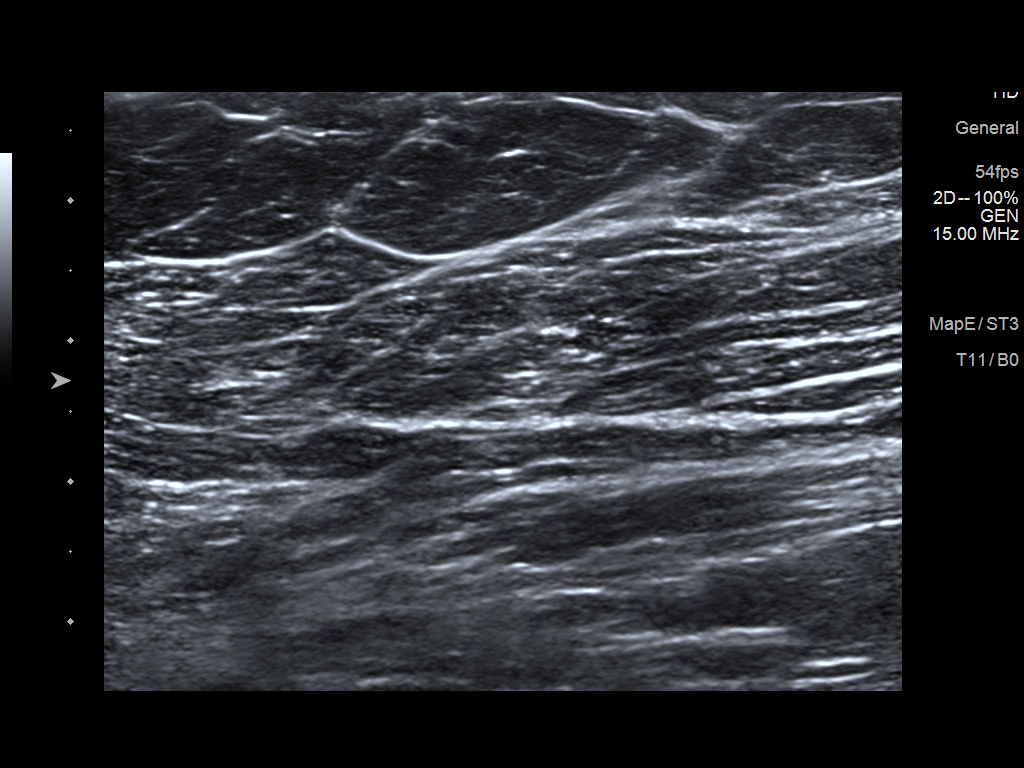
[im 5/6]
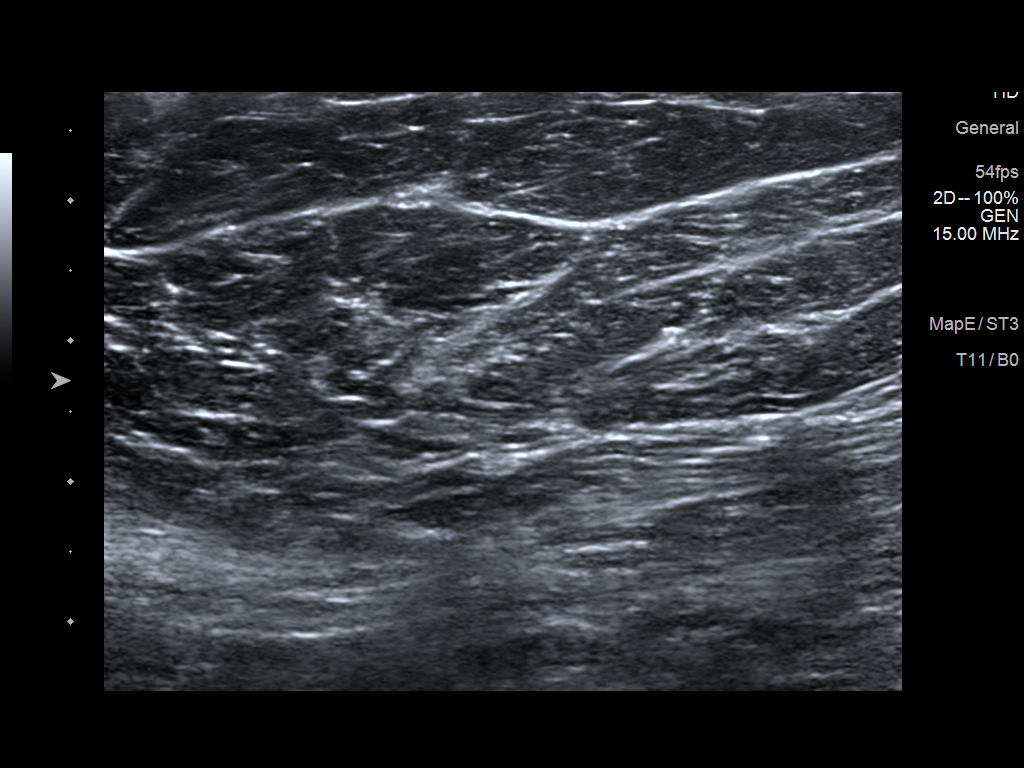
[im 6/6]
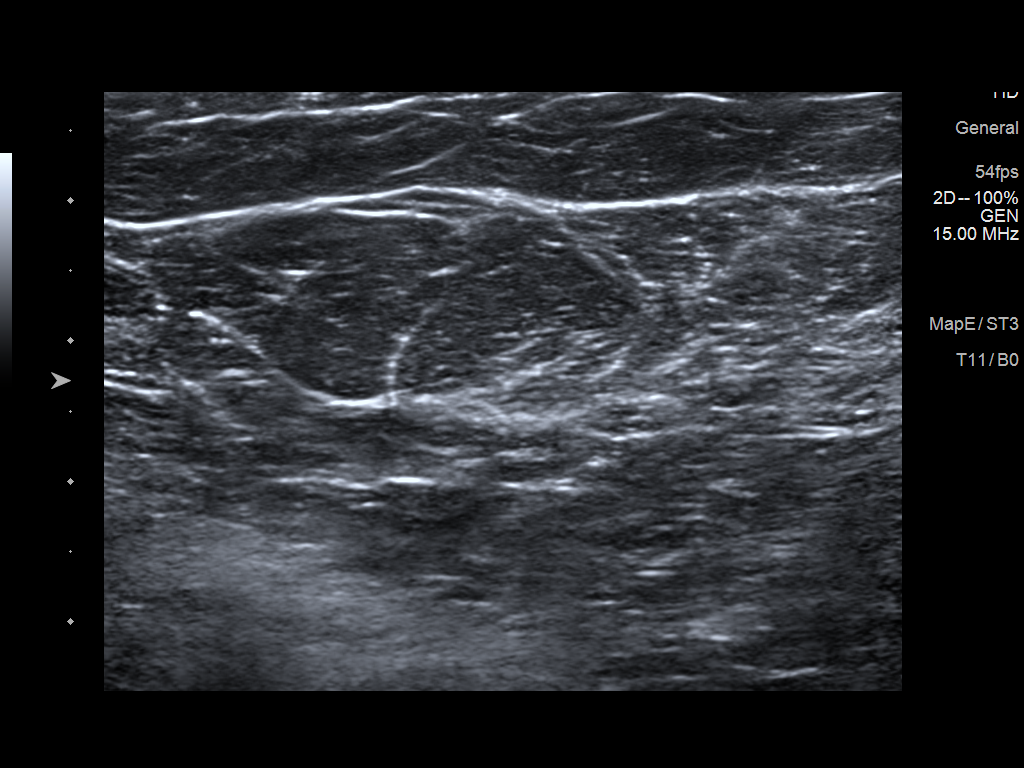

[6 of 6 positions shown; findings below may reference images not displayed]

FINDINGS: A BB has been placed on the medial aspect of the right breast
indicating the palpable site of concern. No suspicious masses are
identified deep to this palpable marker. No suspicious
calcifications, masses or areas of distortion are seen in the
bilateral breasts.

Mammographic images were processed with CAD.

On physical exam, no suspicious palpable masses are identified at
the site of concern in the medial or the upper-outer right breast.

Targeted ultrasound is performed, showing normal fibroglandular
tissue in the medial right breast. The upper-outer quadrant of the
right breast was also scanned demonstrating no suspicious masses or
areas of shadowing.
IMPRESSION: 1. There are no mammographic or targeted sonographic abnormalities
at the palpable site of concern in the right breast.

2.  No mammographic evidence of malignancy in the bilateral breasts.

RECOMMENDATION:
1. Clinical follow-up recommended for the palpable area of concern
in the right breast. Any further workup should be based on clinical
grounds.

2.  Screening mammogram in one year.(Code:[6K])

I have discussed the findings and recommendations with the patient.
Results were also provided in writing at the conclusion of the
visit. If applicable, a reminder letter will be sent to the patient
regarding the next appointment.

BI-RADS CATEGORY  1: Negative.

## 2019-02-17 ENCOUNTER — Other Ambulatory Visit: Payer: Medicare Other

## 2019-02-21 NOTE — Progress Notes (Signed)
Carelink Summary Report / Loop Recorder 

## 2019-02-22 ENCOUNTER — Ambulatory Visit (HOSPITAL_COMMUNITY)
Admission: RE | Admit: 2019-02-22 | Discharge: 2019-02-22 | Disposition: A | Discharge status: Home / Self Care | Payer: Medicare Other | Source: Ambulatory Visit | Attending: Cardiology | Admitting: Cardiology

## 2019-02-22 DIAGNOSIS — I739 Peripheral vascular disease, unspecified: Secondary | ICD-10-CM | POA: Insufficient documentation

## 2019-02-22 DIAGNOSIS — R0989 Other specified symptoms and signs involving the circulatory and respiratory systems: Secondary | ICD-10-CM | POA: Diagnosis not present

## 2019-03-08 ENCOUNTER — Encounter: Payer: Self-pay | Admitting: *Deleted

## 2019-03-08 ENCOUNTER — Telehealth: Payer: Self-pay | Admitting: *Deleted

## 2019-03-08 NOTE — Telephone Encounter (Signed)
IJFTZO:[id number]. PA was denied.  Appears previous PA's denied and she was using goodrx coupon to get rx at [facility].  I checked and goodrx coupon at [employer] [address] about 66.58 however pt recent rx was sent to [facility]  I called pt and relayed above info. She wants to know if more info could be submitted to get rx approved. Advised it would be up to MD/RN if they wanted to try and appeal. I will send message. She verbalized understanding.   Appeal info: [place] [id number]. [phone] Phone:[phone] Fax:[phone] WebAddress:[email or web address];

## 2019-03-08 NOTE — Telephone Encounter (Signed)
My understanding is that it has been appealed before and never was covered at that time.

## 2019-03-08 NOTE — Telephone Encounter (Signed)
Submitted PA quinine on covermymeds. Key: [id number] - PA Case ID: [id number] - Rx #: [id number]. Waiting on determination.  Dx: nocturnal leg cramps G47.62

## 2019-03-09 NOTE — Telephone Encounter (Signed)
Appeal letter is faxed to the fax number below and will have to wait to hear their decision.

## 2019-03-10 NOTE — Telephone Encounter (Signed)
[name] has replied to the appeal and they are still not approving the medication for Quinine Sulfate. They are not approving the medication for the prevention or treatment of leg cramps as it is not approved by FDA.

## 2019-03-18 ENCOUNTER — Ambulatory Visit (INDEPENDENT_AMBULATORY_CARE_PROVIDER_SITE_OTHER): Payer: Medicare Other | Admitting: *Deleted

## 2019-03-18 ENCOUNTER — Other Ambulatory Visit: Payer: Self-pay

## 2019-03-18 DIAGNOSIS — I639 Cerebral infarction, unspecified: Secondary | ICD-10-CM | POA: Diagnosis not present

## 2019-03-19 LAB — CUP PACEART REMOTE DEVICE CHECK
Date Time Interrogation Session: 20200404020623
Implantable Pulse Generator Implant Date: 20171206

## 2019-03-21 ENCOUNTER — Telehealth: Payer: Self-pay | Admitting: Neurology

## 2019-03-21 NOTE — Telephone Encounter (Signed)
[facility] is calling in for a refill request for pts Amovig  Fax# [phone]

## 2019-03-21 NOTE — Telephone Encounter (Signed)
Faxed Aimovig 140 mg prescription (from [employer] Application) that was signed by Dr. [name] for Aimovig 140 mg every 30 days, ok to provide 12 week supply. Received a receipt of confirmation.   Supports case: [id number]

## 2019-03-24 NOTE — Progress Notes (Signed)
Carelink Summary Report / Loop Recorder 

## 2019-04-01 ENCOUNTER — Ambulatory Visit: Payer: Medicare Other | Admitting: Neurology

## 2019-04-08 DIAGNOSIS — E611 Iron deficiency: Secondary | ICD-10-CM | POA: Diagnosis not present

## 2019-04-08 DIAGNOSIS — M109 Gout, unspecified: Secondary | ICD-10-CM | POA: Diagnosis not present

## 2019-04-08 DIAGNOSIS — I10 Essential (primary) hypertension: Secondary | ICD-10-CM | POA: Diagnosis not present

## 2019-04-11 DIAGNOSIS — M1990 Unspecified osteoarthritis, unspecified site: Secondary | ICD-10-CM | POA: Diagnosis not present

## 2019-04-11 DIAGNOSIS — R05 Cough: Secondary | ICD-10-CM | POA: Diagnosis not present

## 2019-04-11 DIAGNOSIS — E611 Iron deficiency: Secondary | ICD-10-CM | POA: Diagnosis not present

## 2019-04-11 DIAGNOSIS — G2581 Restless legs syndrome: Secondary | ICD-10-CM | POA: Diagnosis not present

## 2019-04-11 DIAGNOSIS — M109 Gout, unspecified: Secondary | ICD-10-CM | POA: Diagnosis not present

## 2019-04-11 DIAGNOSIS — J329 Chronic sinusitis, unspecified: Secondary | ICD-10-CM | POA: Diagnosis not present

## 2019-04-11 DIAGNOSIS — I639 Cerebral infarction, unspecified: Secondary | ICD-10-CM | POA: Diagnosis not present

## 2019-04-11 DIAGNOSIS — N183 Chronic kidney disease, stage 3 (moderate): Secondary | ICD-10-CM | POA: Diagnosis not present

## 2019-04-21 ENCOUNTER — Other Ambulatory Visit: Payer: Self-pay

## 2019-04-21 ENCOUNTER — Ambulatory Visit (INDEPENDENT_AMBULATORY_CARE_PROVIDER_SITE_OTHER): Payer: Medicare Other | Admitting: *Deleted

## 2019-04-21 DIAGNOSIS — I639 Cerebral infarction, unspecified: Secondary | ICD-10-CM

## 2019-04-24 LAB — CUP PACEART REMOTE DEVICE CHECK
Date Time Interrogation Session: 20200507023523
Implantable Pulse Generator Implant Date: 20171206

## 2019-04-26 NOTE — Progress Notes (Signed)
Carelink Summary Report / Loop Recorder 

## 2019-05-18 ENCOUNTER — Telehealth: Payer: Self-pay | Admitting: Pharmacist

## 2019-05-18 NOTE — Telephone Encounter (Signed)
Pt approved for Repatha patient assistance through [employer] through 12/15/19.

## 2019-05-22 ENCOUNTER — Other Ambulatory Visit: Payer: Self-pay | Admitting: Neurology

## 2019-05-24 ENCOUNTER — Ambulatory Visit (INDEPENDENT_AMBULATORY_CARE_PROVIDER_SITE_OTHER): Payer: Medicare Other | Admitting: *Deleted

## 2019-05-24 DIAGNOSIS — I639 Cerebral infarction, unspecified: Secondary | ICD-10-CM

## 2019-05-24 LAB — CUP PACEART REMOTE DEVICE CHECK
Date Time Interrogation Session: 20200609044037
Implantable Pulse Generator Implant Date: 20171206

## 2019-06-01 NOTE — Progress Notes (Signed)
Carelink Summary Report / Loop Recorder 

## 2019-06-09 ENCOUNTER — Encounter: Payer: Self-pay | Admitting: Podiatry

## 2019-06-09 ENCOUNTER — Ambulatory Visit (INDEPENDENT_AMBULATORY_CARE_PROVIDER_SITE_OTHER): Payer: Medicare Other | Admitting: Podiatry

## 2019-06-09 ENCOUNTER — Other Ambulatory Visit: Payer: Self-pay

## 2019-06-09 ENCOUNTER — Ambulatory Visit: Payer: Medicare Other

## 2019-06-09 VITALS — Temp 97.3°F

## 2019-06-09 DIAGNOSIS — M10179 Lead-induced gout, unspecified ankle and foot: Secondary | ICD-10-CM | POA: Diagnosis not present

## 2019-06-09 DIAGNOSIS — M79671 Pain in right foot: Secondary | ICD-10-CM

## 2019-06-09 DIAGNOSIS — I639 Cerebral infarction, unspecified: Secondary | ICD-10-CM

## 2019-06-09 DIAGNOSIS — M79672 Pain in left foot: Secondary | ICD-10-CM

## 2019-06-09 DIAGNOSIS — T560X1A Toxic effect of lead and its compounds, accidental (unintentional), initial encounter: Secondary | ICD-10-CM | POA: Diagnosis not present

## 2019-06-12 NOTE — Progress Notes (Signed)
Subjective:   Patient ID: [name], female   DOB: 73 y.o.   MRN: [id number]   HPI Patient presents stating her feet still bother her went on them extensively and she was concerned about her third digit left with discoloration   ROS      Objective:  Physical Exam  Neurovascular status unchanged with patient found to have depression of the arch bilateral and discoloration third nail left that is localized and no active drainage noted     Assessment:  Foot structural pain which is chronic in nature along with nail disease with probable trauma left     Plan:  H PA reviewed both conditions and at this point I have recommended soaks therapy Epson salt support therapy and shoe gear modifications.  Patient will be seen back as needed and both conditions were reviewed with patient currently.  I am also ordering blood work to rule out gout or any other arthritic pathology with this

## 2019-06-16 ENCOUNTER — Other Ambulatory Visit: Payer: Self-pay | Admitting: Podiatry

## 2019-06-16 DIAGNOSIS — T560X1A Toxic effect of lead and its compounds, accidental (unintentional), initial encounter: Secondary | ICD-10-CM | POA: Diagnosis not present

## 2019-06-16 DIAGNOSIS — M10179 Lead-induced gout, unspecified ankle and foot: Secondary | ICD-10-CM | POA: Diagnosis not present

## 2019-06-16 DIAGNOSIS — Z03818 Encounter for observation for suspected exposure to other biological agents ruled out: Secondary | ICD-10-CM | POA: Diagnosis not present

## 2019-06-18 LAB — RHEUMATOID FACTOR: Rheumatoid fact SerPl-aCnc: <10 IU/mL (ref 0.0–13.9)

## 2019-06-18 LAB — SEDIMENTATION RATE: Sed Rate: 21 mm/hr (ref 0–40)

## 2019-06-18 LAB — ANA: Anti Nuclear Antibody (ANA): NEGATIVE

## 2019-06-18 LAB — C-REACTIVE PROTEIN: CRP: 3 mg/L (ref 0–10)

## 2019-06-18 LAB — URIC ACID: Uric Acid: 4.5 mg/dL (ref 2.5–7.1)

## 2019-06-23 ENCOUNTER — Ambulatory Visit: Payer: Medicare Other | Admitting: Podiatry

## 2019-06-27 ENCOUNTER — Ambulatory Visit (INDEPENDENT_AMBULATORY_CARE_PROVIDER_SITE_OTHER): Payer: Medicare Other | Admitting: *Deleted

## 2019-06-27 DIAGNOSIS — I480 Paroxysmal atrial fibrillation: Secondary | ICD-10-CM | POA: Diagnosis not present

## 2019-06-27 LAB — CUP PACEART REMOTE DEVICE CHECK
Date Time Interrogation Session: 20200712044052
Implantable Pulse Generator Implant Date: 20171206

## 2019-06-29 ENCOUNTER — Ambulatory Visit (INDEPENDENT_AMBULATORY_CARE_PROVIDER_SITE_OTHER): Payer: Medicare Other | Admitting: Podiatry

## 2019-06-29 ENCOUNTER — Other Ambulatory Visit: Payer: Self-pay

## 2019-06-29 ENCOUNTER — Encounter: Payer: Self-pay | Admitting: Podiatry

## 2019-06-29 VITALS — Temp 95.1°F

## 2019-06-29 DIAGNOSIS — M779 Enthesopathy, unspecified: Secondary | ICD-10-CM

## 2019-06-29 DIAGNOSIS — M778 Other enthesopathies, not elsewhere classified: Secondary | ICD-10-CM

## 2019-06-29 DIAGNOSIS — I639 Cerebral infarction, unspecified: Secondary | ICD-10-CM | POA: Diagnosis not present

## 2019-06-30 NOTE — Progress Notes (Signed)
Subjective:   Patient ID: [name], female   DOB: 73 y.o.   MRN: [id number]   HPI Patient states she has had a flareup of inflammation around the second metatarsal bilateral and on the dorsum of the left foot and states is been very sore making it hard for her to walk and she is desperate for injection treatment   ROS      Objective:  Physical Exam  Neurovascular status intact with patient's feet doing well with patient noted to have inflammation pain of the second MPJs bilateral and fluid buildup F2     Assessment:  Inflammatory capsulitis second MPJ bilateral with pain     Plan:  Sterile prep done and injected the capsule with joint bilateral 3 mg Dexasone Kenalog 5 mg Xylocaine which was tolerated well and reappoint for routine care

## 2019-07-01 ENCOUNTER — Other Ambulatory Visit: Payer: Self-pay | Admitting: Pharmacist

## 2019-07-01 DIAGNOSIS — K429 Umbilical hernia without obstruction or gangrene: Secondary | ICD-10-CM | POA: Diagnosis not present

## 2019-07-06 NOTE — Progress Notes (Signed)
Carelink Summary Report / Loop Recorder 

## 2019-07-08 DIAGNOSIS — N183 Chronic kidney disease, stage 3 (moderate): Secondary | ICD-10-CM | POA: Diagnosis not present

## 2019-07-08 DIAGNOSIS — E7800 Pure hypercholesterolemia, unspecified: Secondary | ICD-10-CM | POA: Diagnosis not present

## 2019-07-08 DIAGNOSIS — D509 Iron deficiency anemia, unspecified: Secondary | ICD-10-CM | POA: Diagnosis not present

## 2019-07-08 DIAGNOSIS — E611 Iron deficiency: Secondary | ICD-10-CM | POA: Diagnosis not present

## 2019-07-08 DIAGNOSIS — Z0000 Encounter for general adult medical examination without abnormal findings: Secondary | ICD-10-CM | POA: Diagnosis not present

## 2019-07-08 DIAGNOSIS — I10 Essential (primary) hypertension: Secondary | ICD-10-CM | POA: Diagnosis not present

## 2019-07-08 DIAGNOSIS — I639 Cerebral infarction, unspecified: Secondary | ICD-10-CM | POA: Diagnosis not present

## 2019-07-08 DIAGNOSIS — M109 Gout, unspecified: Secondary | ICD-10-CM | POA: Diagnosis not present

## 2019-07-14 DIAGNOSIS — G2581 Restless legs syndrome: Secondary | ICD-10-CM | POA: Diagnosis not present

## 2019-07-14 DIAGNOSIS — Z79899 Other long term (current) drug therapy: Secondary | ICD-10-CM | POA: Diagnosis not present

## 2019-07-14 DIAGNOSIS — M1990 Unspecified osteoarthritis, unspecified site: Secondary | ICD-10-CM | POA: Diagnosis not present

## 2019-07-14 DIAGNOSIS — Z9884 Bariatric surgery status: Secondary | ICD-10-CM | POA: Diagnosis not present

## 2019-07-14 DIAGNOSIS — G243 Spasmodic torticollis: Secondary | ICD-10-CM | POA: Diagnosis not present

## 2019-07-14 DIAGNOSIS — M109 Gout, unspecified: Secondary | ICD-10-CM | POA: Diagnosis not present

## 2019-07-14 DIAGNOSIS — J329 Chronic sinusitis, unspecified: Secondary | ICD-10-CM | POA: Diagnosis not present

## 2019-07-14 DIAGNOSIS — N183 Chronic kidney disease, stage 3 (moderate): Secondary | ICD-10-CM | POA: Diagnosis not present

## 2019-07-14 DIAGNOSIS — I639 Cerebral infarction, unspecified: Secondary | ICD-10-CM | POA: Diagnosis not present

## 2019-07-29 ENCOUNTER — Ambulatory Visit (INDEPENDENT_AMBULATORY_CARE_PROVIDER_SITE_OTHER): Payer: Medicare Other | Admitting: *Deleted

## 2019-07-29 DIAGNOSIS — I480 Paroxysmal atrial fibrillation: Secondary | ICD-10-CM | POA: Diagnosis not present

## 2019-07-29 LAB — CUP PACEART REMOTE DEVICE CHECK
Date Time Interrogation Session: 20200814081301
Implantable Pulse Generator Implant Date: 20171206

## 2019-08-05 NOTE — Progress Notes (Signed)
Carelink Summary Report / Loop Recorder 

## 2019-08-12 DIAGNOSIS — R600 Localized edema: Secondary | ICD-10-CM | POA: Diagnosis not present

## 2019-08-12 DIAGNOSIS — L539 Erythematous condition, unspecified: Secondary | ICD-10-CM | POA: Diagnosis not present

## 2019-08-12 DIAGNOSIS — I10 Essential (primary) hypertension: Secondary | ICD-10-CM | POA: Diagnosis not present

## 2019-08-12 DIAGNOSIS — R52 Pain, unspecified: Secondary | ICD-10-CM | POA: Diagnosis not present

## 2019-08-15 ENCOUNTER — Telehealth: Payer: Self-pay | Admitting: Neurology

## 2019-08-15 NOTE — Telephone Encounter (Signed)
I called pt, advised her that I have reached out to [facility] for assistance and they should call her. Pt verbalized understanding.

## 2019-08-15 NOTE — Telephone Encounter (Signed)
Pt states her CPAP has not worked in 3 days.  Please call, pt states she is unable to sleep without it

## 2019-08-15 NOTE — Telephone Encounter (Signed)
Received this notice from [facility]: "I have sent a message to our RT Team to contact the patient to see if they can trouble shoot over the phone."

## 2019-08-26 ENCOUNTER — Other Ambulatory Visit: Payer: Self-pay | Admitting: Neurology

## 2019-08-31 ENCOUNTER — Ambulatory Visit (INDEPENDENT_AMBULATORY_CARE_PROVIDER_SITE_OTHER): Payer: Medicare Other | Admitting: *Deleted

## 2019-08-31 DIAGNOSIS — I639 Cerebral infarction, unspecified: Secondary | ICD-10-CM | POA: Diagnosis not present

## 2019-08-31 LAB — CUP PACEART REMOTE DEVICE CHECK
Date Time Interrogation Session: 20200916094909
Implantable Pulse Generator Implant Date: 20171206

## 2019-09-06 NOTE — Progress Notes (Signed)
Carelink Summary Report / Loop Recorder 

## 2019-09-09 DIAGNOSIS — M25552 Pain in left hip: Secondary | ICD-10-CM | POA: Diagnosis not present

## 2019-09-16 DIAGNOSIS — Z23 Encounter for immunization: Secondary | ICD-10-CM | POA: Diagnosis not present

## 2019-09-20 ENCOUNTER — Telehealth: Payer: Self-pay

## 2019-09-20 NOTE — Telephone Encounter (Signed)
Pt would like for the pt to fax a letter to [facility] to send a letter stating her loop recorder is MRI capable. I gave the fax number to [place], [place].

## 2019-09-20 NOTE — Telephone Encounter (Signed)
Letter faxed, confirmation received.

## 2019-09-26 ENCOUNTER — Other Ambulatory Visit: Payer: Self-pay | Admitting: Orthopaedic Surgery

## 2019-09-26 DIAGNOSIS — M25552 Pain in left hip: Secondary | ICD-10-CM

## 2019-09-27 ENCOUNTER — Ambulatory Visit
Admission: RE | Admit: 2019-09-27 | Discharge: 2019-09-27 | Disposition: A | Discharge status: Home / Self Care | Payer: Medicare Other | Source: Ambulatory Visit | Attending: Orthopaedic Surgery | Admitting: Orthopaedic Surgery

## 2019-09-27 DIAGNOSIS — M25552 Pain in left hip: Secondary | ICD-10-CM

## 2019-09-27 DIAGNOSIS — S76312A Strain of muscle, fascia and tendon of the posterior muscle group at thigh level, left thigh, initial encounter: Secondary | ICD-10-CM | POA: Diagnosis not present
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
## 2019-09-30 DIAGNOSIS — M25552 Pain in left hip: Secondary | ICD-10-CM | POA: Diagnosis not present

## 2019-10-03 ENCOUNTER — Ambulatory Visit (INDEPENDENT_AMBULATORY_CARE_PROVIDER_SITE_OTHER): Payer: Medicare Other | Admitting: *Deleted

## 2019-10-03 DIAGNOSIS — I480 Paroxysmal atrial fibrillation: Secondary | ICD-10-CM | POA: Diagnosis not present

## 2019-10-03 LAB — CUP PACEART REMOTE DEVICE CHECK
Date Time Interrogation Session: 20201019120706
Implantable Pulse Generator Implant Date: 20171206

## 2019-10-06 DIAGNOSIS — R262 Difficulty in walking, not elsewhere classified: Secondary | ICD-10-CM | POA: Diagnosis not present

## 2019-10-06 DIAGNOSIS — S76312D Strain of muscle, fascia and tendon of the posterior muscle group at thigh level, left thigh, subsequent encounter: Secondary | ICD-10-CM | POA: Diagnosis not present

## 2019-10-10 ENCOUNTER — Telehealth: Payer: Self-pay

## 2019-10-10 NOTE — Telephone Encounter (Signed)
PA for Aimovig done on cover my meds. Wait for Determination Please wait for [employer] to return a determination

## 2019-10-11 NOTE — Telephone Encounter (Signed)
PA for  A[employer]   [employer] has not yet replied to your PA request. You may close this dialog, return to your dashboard, and perform other tasks. To check for an update later, open this request again from your dashboard. If [employer] has not replied to your request within 24 hours please contact [employer] at [phone]. For [facility] members, please call [phone].

## 2019-10-13 NOTE — Telephone Encounter (Signed)
Received approval from [place]. Aimovig 140 mg approved from 09/10/2019 to 10/10/2020. I faxed the approval to the [employer]. Received a receipt of confirmation.

## 2019-10-18 DIAGNOSIS — M1990 Unspecified osteoarthritis, unspecified site: Secondary | ICD-10-CM | POA: Diagnosis not present

## 2019-10-18 DIAGNOSIS — J45909 Unspecified asthma, uncomplicated: Secondary | ICD-10-CM | POA: Diagnosis not present

## 2019-10-18 DIAGNOSIS — J329 Chronic sinusitis, unspecified: Secondary | ICD-10-CM | POA: Diagnosis not present

## 2019-10-19 NOTE — Telephone Encounter (Signed)
[email or web address] has called to report that they are need of the  Re enrollment form for the [place] for pt.  [name] stated there are 4 pages needed from the application that can be obtained from there website.  [name] states their call back # is [phone]

## 2019-10-20 NOTE — Telephone Encounter (Signed)
I spoke with the patient. When she comes on Monday 11/9 for her appt with Dr. Brett Fairy, she will complete another aimovig assistance application for the year 2021.

## 2019-10-21 NOTE — Progress Notes (Signed)
Carelink Summary Report / Loop Recorder 

## 2019-10-24 ENCOUNTER — Ambulatory Visit: Payer: Self-pay | Admitting: Neurology

## 2019-10-25 ENCOUNTER — Encounter: Payer: Self-pay | Admitting: Neurology

## 2019-10-25 ENCOUNTER — Emergency Department (HOSPITAL_COMMUNITY): Payer: No Typology Code available for payment source

## 2019-10-25 ENCOUNTER — Other Ambulatory Visit: Payer: Self-pay

## 2019-10-25 ENCOUNTER — Encounter (HOSPITAL_COMMUNITY): Payer: Self-pay

## 2019-10-25 ENCOUNTER — Emergency Department (HOSPITAL_COMMUNITY)
Admission: EM | Admit: 2019-10-25 | Discharge: 2019-10-25 | Disposition: A | Discharge status: Home / Self Care | Payer: No Typology Code available for payment source | Attending: Emergency Medicine | Admitting: Emergency Medicine

## 2019-10-25 DIAGNOSIS — T07XXXA Unspecified multiple injuries, initial encounter: Secondary | ICD-10-CM

## 2019-10-25 DIAGNOSIS — Y92410 Unspecified street and highway as the place of occurrence of the external cause: Secondary | ICD-10-CM | POA: Insufficient documentation

## 2019-10-25 DIAGNOSIS — S2020XA Contusion of thorax, unspecified, initial encounter: Secondary | ICD-10-CM | POA: Diagnosis not present

## 2019-10-25 DIAGNOSIS — Z79899 Other long term (current) drug therapy: Secondary | ICD-10-CM | POA: Diagnosis not present

## 2019-10-25 DIAGNOSIS — S301XXA Contusion of abdominal wall, initial encounter: Secondary | ICD-10-CM | POA: Insufficient documentation

## 2019-10-25 DIAGNOSIS — R109 Unspecified abdominal pain: Secondary | ICD-10-CM | POA: Diagnosis not present

## 2019-10-25 DIAGNOSIS — J45909 Unspecified asthma, uncomplicated: Secondary | ICD-10-CM | POA: Insufficient documentation

## 2019-10-25 DIAGNOSIS — S299XXA Unspecified injury of thorax, initial encounter: Secondary | ICD-10-CM | POA: Diagnosis not present

## 2019-10-25 DIAGNOSIS — S0990XA Unspecified injury of head, initial encounter: Secondary | ICD-10-CM | POA: Diagnosis not present

## 2019-10-25 DIAGNOSIS — Y999 Unspecified external cause status: Secondary | ICD-10-CM | POA: Insufficient documentation

## 2019-10-25 DIAGNOSIS — Y93I9 Activity, other involving external motion: Secondary | ICD-10-CM | POA: Diagnosis not present

## 2019-10-25 DIAGNOSIS — S199XXA Unspecified injury of neck, initial encounter: Secondary | ICD-10-CM | POA: Diagnosis not present

## 2019-10-25 DIAGNOSIS — S1093XA Contusion of unspecified part of neck, initial encounter: Secondary | ICD-10-CM | POA: Diagnosis not present

## 2019-10-25 DIAGNOSIS — R079 Chest pain, unspecified: Secondary | ICD-10-CM | POA: Diagnosis not present

## 2019-10-25 DIAGNOSIS — R0789 Other chest pain: Secondary | ICD-10-CM | POA: Insufficient documentation

## 2019-10-25 DIAGNOSIS — S3991XA Unspecified injury of abdomen, initial encounter: Secondary | ICD-10-CM | POA: Diagnosis not present

## 2019-10-25 DIAGNOSIS — Z96652 Presence of left artificial knee joint: Secondary | ICD-10-CM | POA: Diagnosis not present

## 2019-10-25 LAB — CBC WITH DIFFERENTIAL/PLATELET
Abs Immature Granulocytes: 0.03 10*3/uL (ref 0.00–0.07)
Basophils Absolute: 0.1 10*3/uL (ref 0.0–0.1)
Basophils Relative: 1 %
Eosinophils Absolute: 0.2 10*3/uL (ref 0.0–0.5)
Eosinophils Relative: 3 %
HCT: 39.8 % (ref 36.0–46.0)
Hemoglobin: 12.3 g/dL (ref 12.0–15.0)
Immature Granulocytes: 0 %
Lymphocytes Relative: 14 %
Lymphs Abs: 0.9 10*3/uL (ref 0.7–4.0)
MCH: 30.8 pg (ref 26.0–34.0)
MCHC: 30.9 g/dL (ref 30.0–36.0)
MCV: 99.7 fL (ref 80.0–100.0)
Monocytes Absolute: 0.5 10*3/uL (ref 0.1–1.0)
Monocytes Relative: 7 %
Neutro Abs: 5.2 10*3/uL (ref 1.7–7.7)
Neutrophils Relative %: 75 %
Platelets: 284 10*3/uL (ref 150–400)
RBC: 3.99 MIL/uL (ref 3.87–5.11)
RDW: 13.6 % (ref 11.5–15.5)
WBC: 6.9 10*3/uL (ref 4.0–10.5)
nRBC: 0 % (ref 0.0–0.2)

## 2019-10-25 LAB — HEPATIC FUNCTION PANEL
ALT: 14 U/L (ref 0–44)
AST: 18 U/L (ref 15–41)
Albumin: 3 g/dL — ABNORMAL LOW (ref 3.5–5.0)
Alkaline Phosphatase: 83 U/L (ref 38–126)
Bilirubin, Direct: <0.1 mg/dL (ref 0.0–0.2)
Total Bilirubin: 0.3 mg/dL (ref 0.3–1.2)
Total Protein: 6.5 g/dL (ref 6.5–8.1)

## 2019-10-25 LAB — I-STAT CHEM 8, ED
BUN: 15 mg/dL (ref 8–23)
Calcium, Ion: 1.17 mmol/L (ref 1.15–1.40)
Chloride: 104 mmol/L (ref 98–111)
Creatinine, Ser: 0.9 mg/dL (ref 0.44–1.00)
Glucose, Bld: 129 mg/dL — ABNORMAL HIGH (ref 70–99)
HCT: 38 % (ref 36.0–46.0)
Hemoglobin: 12.9 g/dL (ref 12.0–15.0)
Potassium: 4 mmol/L (ref 3.5–5.1)
Sodium: 141 mmol/L (ref 135–145)
TCO2: 23 mmol/L (ref 22–32)
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
## 2019-10-25 MED ORDER — IOHEXOL 300 MG/ML  SOLN
100.0000 mL | Freq: Once | INTRAMUSCULAR | Status: AC | PRN
  Administered 2019-10-25: 100 mL via INTRAVENOUS
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
## 2019-10-25 NOTE — Telephone Encounter (Signed)
The pt came by the office today and completed an [employer] assistance application for the year 2021. Pending MD signature then will be faxed to [place].

## 2019-10-25 NOTE — Telephone Encounter (Signed)
Marine scientist signed by Dr. [name] and faxed to [facility]. Also included [employer] approval letter from [place]. Received a receipt of confirmation.

## 2019-11-06 LAB — CUP PACEART REMOTE DEVICE CHECK
Date Time Interrogation Session: 20201121084315
Implantable Pulse Generator Implant Date: 20171206

## 2019-11-07 ENCOUNTER — Ambulatory Visit (INDEPENDENT_AMBULATORY_CARE_PROVIDER_SITE_OTHER): Payer: Medicare Other | Admitting: *Deleted

## 2019-11-07 DIAGNOSIS — I480 Paroxysmal atrial fibrillation: Secondary | ICD-10-CM | POA: Diagnosis not present

## 2019-11-08 ENCOUNTER — Other Ambulatory Visit: Payer: Self-pay | Admitting: Neurology

## 2019-11-09 ENCOUNTER — Telehealth: Payer: Self-pay | Admitting: Interventional Cardiology

## 2019-11-09 DIAGNOSIS — M25552 Pain in left hip: Secondary | ICD-10-CM | POA: Diagnosis not present

## 2019-11-09 DIAGNOSIS — M7541 Impingement syndrome of right shoulder: Secondary | ICD-10-CM | POA: Diagnosis not present

## 2019-11-09 DIAGNOSIS — Z6835 Body mass index (BMI) 35.0-35.9, adult: Secondary | ICD-10-CM | POA: Diagnosis not present

## 2019-11-09 DIAGNOSIS — M545 Low back pain: Secondary | ICD-10-CM | POA: Diagnosis not present

## 2019-11-09 NOTE — Telephone Encounter (Signed)
lmomed the pt stating that they need to contact [employer] for refills

## 2019-11-09 NOTE — Telephone Encounter (Signed)
° ° °*  STAT* If patient is at the pharmacy, call can be transferred to refill team.   1. Which medications need to be refilled? (please list name of each medication and dose if known) Evolocumab with Infusor ([place]) 420 MG/3.5ML SOCT  2. Which pharmacy/location (including street and city if local pharmacy) is medication to be sent to?     [place], [place]      3. Do they need a 30 day or 90 day supply? N/A    Patient believes she needs a new prescription for Evolocumab with Infusor ([place]) 420 MG/3.5ML SOCT states that the cartridge is not working.

## 2019-11-09 NOTE — Telephone Encounter (Signed)
Pt gets her Repatha for free from the [employer] - she will need to call them for a replacement.

## 2019-11-23 ENCOUNTER — Telehealth: Payer: Self-pay | Admitting: Neurology

## 2019-11-23 ENCOUNTER — Ambulatory Visit (INDEPENDENT_AMBULATORY_CARE_PROVIDER_SITE_OTHER): Payer: Medicare Other | Admitting: Neurology

## 2019-11-23 ENCOUNTER — Encounter

## 2019-11-23 ENCOUNTER — Encounter: Payer: Self-pay | Admitting: Neurology

## 2019-11-23 VITALS — BP 122/82 | HR 74 | Temp 97.1°F | Ht 67.0 in | Wt 243.0 lb

## 2019-11-23 DIAGNOSIS — Z9884 Bariatric surgery status: Secondary | ICD-10-CM | POA: Diagnosis not present

## 2019-11-23 DIAGNOSIS — D509 Iron deficiency anemia, unspecified: Secondary | ICD-10-CM

## 2019-11-23 DIAGNOSIS — I639 Cerebral infarction, unspecified: Secondary | ICD-10-CM | POA: Diagnosis not present

## 2019-11-23 DIAGNOSIS — Z9989 Dependence on other enabling machines and devices: Secondary | ICD-10-CM | POA: Diagnosis not present

## 2019-11-23 DIAGNOSIS — I480 Paroxysmal atrial fibrillation: Secondary | ICD-10-CM | POA: Diagnosis not present

## 2019-11-23 DIAGNOSIS — G4733 Obstructive sleep apnea (adult) (pediatric): Secondary | ICD-10-CM | POA: Diagnosis not present

## 2019-11-23 DIAGNOSIS — I2729 Other secondary pulmonary hypertension: Secondary | ICD-10-CM | POA: Diagnosis not present

## 2019-11-23 MED ORDER — QUININE SULFATE 324 MG PO CAPS: 324.0000 mg | ORAL_CAPSULE | Freq: Two times a day (BID) | ORAL | 3 refills | Status: DC

## 2019-11-23 MED ORDER — ROPINIROLE HCL 1 MG PO TABS: ORAL_TABLET | ORAL | 3 refills | Status: DC

## 2019-11-23 MED ORDER — GABAPENTIN 800 MG PO TABS: 800.0000 mg | ORAL_TABLET | Freq: Two times a day (BID) | ORAL | 3 refills | Status: DC

## 2019-11-23 NOTE — Progress Notes (Signed)
[place]   Provider:  Larey Seat, M D  Referring Provider: [name], MD Primary Care Physician:    Chief Complaint  Patient presents with   Follow-up    pt alone rm 10. pt states things are ok she is eligible for a new machine in feb 2021, she was asking if script can be written and sent to [facility] for them to have on file when she is due.     HPI:  [name] is a 73 y.o. female  Is seen here as a revisit for snoring and restless legs. She has severe OSA , controlled on CPAP and hypersomnia. She has been diagnosed with dystonia , cervicalgia and ophthalmic migraines.   RV 11-23-2019, [name] has a history of significant 0P -5 ER visit iron deficiency anemia and has gotten relief for her restless leg syndrome only after IV transfusion of iron products.  Aside from restless legs she is also a CPAP user for the presence of obstructive sleep apnea and she is due for a new machine.  She has kept her BMI under 40 and she looks healthy and in good shape today.  She is status post gastric bypass surgery.  As to the new CPAP machine I can order a machine I could also repeat her current sleep test in form of a home sleep test to give [place] a new baseline but before I ordered a home sleep test I want her treated for restless legs as I think that the results of a home sleep test would be significantly altered while she has restless legs.  It is visible as she is constantly rubbing her legs here today sitting across from me.    I have followed [name] from 2003 through 2008  And see her now for CPAP compliance.  The patient had undergone gastric bypass surgery, which led to an extensive loss of body weight. In the process she was no longer suffering from obstructive sleep apnea related to the weight loss, but she developed restless legs. Repeated iron studies had shown that she had extremely low ferritin levels. A ferritin level lower than 50 is associated with a much higher  incidence of restless leg syndrome in addition her iron and iron binding capacities have been surprisingly normal and this conflict of data we have discussed today. Usually I would treat the patient with Ferrlecit and infusion once every 3 months or so to keep up ferritin levels in in her case I am not sure how much free iron is actually in her system. 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 In addition she is vitamin D deficient which are almost 80% of adult Americans. Gained some weight back but she has no longer nausea or dumping syndrome so therefore she does not need Phenergan or Zofran. She is using gabapentin and ropinirole to treat her restless legs she often wakes up with a headache and she suffers from at times severe crippling migraines. Leg cramps also interfere with her sleep the initiation of sleep as well as the sleep duration at night.  As her BMI exceeds 35, she may also have developed apnea again and snoring has been witnessed by her family. We are meeting today to reevaluate all these conditions in context. She is also excessively daytime sleepy for fatigue severity score is endorsed at 63 points her Epworth sleepiness score was endorsed at 15 points. Bedtime is usually 10 Pm and her husband  leaves for work at 6.30 AM , after that she will sleep for 2-3 hours. Total daily sleep time is 14 hours, this is more likely related to depression.   [name] is [birth year]. #1 She is doing moderately well with her CPAP use was a compliance of 73% average user time on days of use is 4 hours and 44 minutes. Minimum pressure for this AutoSet 7 maximum 15 cm water. The patient's AHI was 2.5 which is a desirable result. The 95th percentile pressure is 12.3. The patient could not use the machine for about 2 weeks after she contracted an upper respiratory infection doing a vacation stay in [place]. She is now back to using it regularly.  #2 her restless leg question here  of was endorsed at moderate impairment quality of life.  It seems that her iron levels have rebounded to some degree , but Ferritin results are pending. Her geriatric depression score does not indicate depression her Epworth sleepiness score was endorsed at 8 points and her fatigue severity score 26 points.  #3 headache are a concern. She hates the pain clinic. We are not providing pain management in this clinic but I do feel that the patient could change with the same narcotic agreement and contract to [name]  Her main care provider. She has no history of overusing up using or accidentally overdosing any medications. Her depression scores are in normal range not indicated clinical depression at all. The patient is aware that she could receive with a headache attack Depakote IV. However I am not thrilled with the opportunity of preventing migrainous headaches by Depakote, given the risk of weight gain, liver failure tremor. She has been on a beta blocker which has helped to some degree. She would be interested in some kind of injection therapy but I explained that Botox injections in the Medicare population on very difficult to get paid for. And Botox is an extremely costly medication these days. The patient has some basic medical training and she would be willing to give herself a shot. We discussed TORADOL  which can be taken either as a 10 mg 2 pill or as an IM medication.  #4 patient will receive 1 mL equaling 2,000,000 g of Toradol intramuscular injection today for her acute headache. If this works well for her it could be an alternative treatment and hopefully will relieve her of the need to take narcotics to. The patient is a status post gastric bypass surgery so she cannot have nonsteroidal by mouth .  Interval history from 04/09/2016. [name] is seen here today after she has twice been seen by my colleague Dr. [name]. I had sent her to her to evaluate her headaches and possible dystonia and  neuralgic character and she was referred for Dr. Kittie Plater for possible Botox injections. Within the workup for headaches and MRI of the brain was obtained. This showed 2 small wedge shaped foci in the left hemisphere consistent with prior small ischemic strokes. She was started on aspirin baby size and Zetia. There was chronic microvascular ischemic change as expected for age, some cortical atrophy no acute findings. At the time that she saw Dr. [name] she had neck pain for 7-8 months already. Voltaren gel helps a little bit pain on movement. She has migraines without aura that started already when she was a teenager. Tried topiramate in the past has failed amitriptyline, try Toradol, tried Imitrex , Cymbalta.  She has weaned off all pain medications.  CT of the  soft tissue of the neck was also reviewed ; there is evidence of cervical disc and facet degeneration. An echocardiogram was ordered revealing a patent foramen ovale or she is scheduled for an occipital nerve block with Dr. Dr. Kittie Plater, he will do a second one next coming Friday. This has helped her neck pain. She has not seen her cardiologist.  Mrs. Oommen presents today on 10/23/2016. She has recently seen Dr. [name] again who has been successfully treating her head and neck pain. 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 The pain has improved. As to her CPAP use is 87% compliant with an average user time of 6 hours and 9 minutes, she is using an AutoSet between 7 and 15 cm water. Her 95th percentile pressure is 14.4 and straddles close to the maximum pressure allowed. A residual AHI is 7.2 and the residual apneas are obstructive in nature. I do think we should increase the pressure window by 2 cm water. She endorsed today the Epworth sleepiness score at only 4 points.  Her RLS is controlled on 2 nightly doses of medication, each dose giving relief for about 4 hours. She takes quinine for leg cramps. She has  No  arryhthmia, has a loop recorder .  Recent mamogram was normal but her cardiologist discovered a mass under the 4 th rib. She is awaiting a CT chest now.    02[facility]02-[facility]-Mrs. Stepien is seen here today in a regular revisit, but I have seen her last almost 14 months ago.  Her last visit at [facility] was with Dr. [name] whose note I quote below.  The patient has remained a compliant CPAP user for 25 out of the last 30 days, 5 hours 52 minutes on average use, AutoSet between 5 and 16 cm water pressure with 3 cm EPR residual AHI is 5.7 the vast majority seems to be obstructive in nature yet the 95th percentile pressure is only 14 cmH2O.  We will either have to raise the maximum pressure or reduce the expiratory pressure relief.  She does have high air leaks at times.  She feels extremely fatigued again- but endorsed only 36 points on the fatigue score and 5 points on the Epworth sleepiness score. She wonders if her iron is low and [name] has just yesterday obtained no labs of which are not previous.  She has had another IV iron infusion in 2018 and one 2 weeks ago.  In addition she reports that Dr. Tamala Julian, her cardiologist, would like her control of cholesterol to be much better and recommends the injectable epatha,  which cost her $500 a month and co-pay.  However it is probably the most effective treatment she can get.  All oral  Statin medications have caused myositis and myalgia.  DR Ahern's note. 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 She has significant pain, decreased ROM which is chronic for over one year. She gets regular massage. We have performed multiple trigger point injections with Depo-Medrol, lidocaine and Marcaine. She has tried baclofen, Cymbalta, oxycodone, Lyrica, gabapentin, Robaxin without any relief. Patient would benefit from Physical therapy for stretching, strengthening, manual therapy and massage as well as dry needling of the right trapezius  muscle and any other muscles o modalities as clinically warranted by physical therapy evaluation  Procedure note for Spasmodic torticollis:EMG: EMG guidance was used to inject muscles detailed below. Aseptic procedure was performed and patient tolerated procedure. Procedure was performed by Dr. Loni Muse  [name]. Patient tolerated the procedure. Refractory to oral medications . Motrin / tylenol for injections site pain / soreness . REMS precautions handout given to patient  RTC - see instructions for details Wasted 200 Units. Dysport-500 units [id number] x 2 vials. Dysport-500unitsx2vial     Social history:  Retired, married , adult children. Daughter and mother were excessively daytime sleepy. Mother and daughter have RLS.     CPAP : machine issued on 2-08-17-15.  12-9 2020 CPAP compliance has been excellent at 77% the average use at time on days used is 5 hours 26 minutes.  The patient had uses an auto titration between 7 and 16 cmH2O with 3 cm EPR and has a residual AHI of 3.2.  95th percentile pressure is 14 cmH2O all residual apneas seem to be obstructive in nature.  I do think we could increase the maximum pressure by another centimeter or relief EPR to 2 cm to allow her getting enough airway pressure to overcome the obstructive events.  There were 3 minutes or 2% of the nights Cheyne-Stokes respirations noted.  This is not concerning.  She has a very good fit with her interface and very little air leak.   IMPRESSION:  2017 This MRI of the brain with and without contrast shows the following: 1.   Two small wedge-shaped foci in the left hemisphere consistent with prior small ischemic strokes.  As both involve the gray matter and juxtacortical white matter, consider an embolic etiology 2.   Chronic microvascular ischemic change elsewhere, typical extent for age. ( can be  Gastric bypass related )  3.    Cortical atrophy, more than expected for age. 4.   There is a normal enhancement pattern and there are no  acute findings.   INTERPRETING PHYSICIAN:  Richard A. Felecia Shelling, MD, PhD Certified in  [place] by [employer]   Review of Systems: Out of a complete 14 system review, the patient complains of only the following symptoms, and all other reviewed systems are negative.  Restless legs she often wakes up with a headache and she suffers from at times severe crippling migraines. She has dizziness, optic migraine, scotoma in visiual field.  Leg cramps also interfere with her sleep the initiation of sleep as well as the sleep duration at night.  Epworth Sleepiness score 3.   How likely are you to doze in the following situations: 0 = not likely, 1 = slight chance, 2 = moderate chance, 3 = high chance  Sitting and Reading? Watching Television? Sitting inactive in a public place (theater or meeting)? Lying down in the afternoon when circumstances permit? Sitting and talking to someone? Sitting quietly after lunch without alcohol? In a car, while stopped for a few minutes in traffic? As a passenger in a car for an hour without a break?  Total = 3 on 11-23-2019   Fatigue severity score 9 from 24/ 63 - remarkable.  ,  depression score 3.   She lost a lot of weight, she is exercising and she weaned aff all narcotics in 2017.  RLS recurrent. .     Social History   Socioeconomic History   Marital status: Married    Spouse name: Pilar Jarvis   Number of children: 3   Years of education: college   Highest education level: Not on file  Occupational History   Occupation: Retired  Scientist, product/process development strain: Not on file   Food insecurity    Worry: Not  on file    Inability: Not on file   Transportation needs    Medical: Not on file    Non-medical: Not on file  Tobacco Use   Smoking status: Never Smoker   Smokeless tobacco: Never Used  Substance and Sexual Activity   Alcohol use: No    Alcohol/week: 0.0 standard drinks   Drug use:  No   Sexual activity: Yes    Birth control/protection: Post-menopausal  Lifestyle   Physical activity    Days per week: Not on file    Minutes per session: Not on file   Stress: Not on file  Relationships   Social connections    Talks on phone: Not on file    Gets together: Not on file    Attends religious service: Not on file    Active member of club or organization: Not on file    Attends meetings of clubs or organizations: Not on file    Relationship status: Not on file   Intimate partner violence    Fear of current or ex partner: Not on file    Emotionally abused: Not on file    Physically abused: Not on file    Forced sexual activity: Not on file  Other Topics Concern   Not on file  Social History Narrative   Lives at home w/ her husband   Caffeine 8-10 cups daily.  (coffee 1 cup am, unsw tea all day long).     Family History  Problem Relation Age of Onset   CAD Mother    Hypertension Mother    Neuropathy Mother    Osteoarthritis Mother    Heart disease Mother    Breast cancer Maternal Grandmother    CAD Paternal Grandfather    Colon cancer Father     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   Arthritis    generalized   Asthma    Atrophic vaginitis    Back pain    DDD/stenosis   Carotid stenosis    Carotid US 10/16: Plaque RICA (6-29%), normal LICA   Depression    takes Cymbalta daily   Diverticulosis    benign   DJD (degenerative joint disease)    Dyslipidemia    Dysrhythmia    Family history of GI bleeding    GERD (gastroesophageal reflux disease)    takes Nexium daily   Gestational diabetes    H/O hiatal hernia    surgery for hernia   Headache(784.0)    takes Imitrex daily as needed and Bisoprolol daily;last migraine was about 2wks ago   History of bronchitis 1 yr ago   History of shingles    Insomnia    takes Ambien nightly   Joint pain    Joint  swelling    Leg cramps    takes Flexeril daily as needed   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   Nocturia    Osteoporosis    Peripheral neuropathy    takes Gabapentin daily   Pneumonia 32yrs ago   hx of   Restless leg syndrome    takes Requip daily   RLS (restless legs syndrome) 08/16/2015   Sleep apnea    uses CPAP   Tubular adenoma of colon    Vertigo    Vitamin D deficiency     Current Outpatient Medications  Medication Sig Dispense Refill   allopurinol (ZYLOPRIM) 100 MG tablet TK 1 T PO QD FOR  GOUT PREVENTION OR FLARE     baclofen (LIORESAL) 10 MG tablet TAKE 1 TABLET(10 MG) BY MOUTH TWICE DAILY 60 tablet 0   bisoprolol (ZEBETA) 10 MG tablet Take 10 mg by mouth 2 (two) times daily.     calcium carbonate (OS-CAL) 1250 (500 CA) MG chewable tablet Chew 1 tablet by mouth daily. Not sure of dosage     diclofenac sodium (VOLTAREN) 1 % GEL Apply 4 g topically 4 (four) times daily. 100 g 11   DULoxetine (CYMBALTA) 60 MG capsule Take 60 mg by mouth daily after supper.  11   Erenumab-aooe (AIMOVIG 140 DOSE) 70 MG/ML SOAJ Inject 2 pens into the skin every 30 (thirty) days. 2 pen 0   Erenumab-aooe (AIMOVIG) 140 MG/ML SOAJ Inject 140 mg into the skin every 30 (thirty) days. 1 pen 11   esomeprazole (NEXIUM) 40 MG capsule Take 40 mg by mouth 2 (two) times daily before a meal.   6   Evolocumab with Infusor ([place]) 420 MG/3.5ML SOCT Inject 1 Cartridge into the skin every 30 (thirty) days. 3.6 mL    furosemide (LASIX) 20 MG tablet Take 40 mg by mouth daily as needed for fluid or edema.     gabapentin (NEURONTIN) 300 MG capsule      GABAPENTIN PO Take 300 mg by mouth 2 (two) times daily.      HYDROcodone-acetaminophen (NORCO) 10-325 MG tablet Take 1 tablet by mouth every 8 (eight) hours as needed for moderate pain or severe pain. Takes for migraines     Loratadine (CLARITIN) 10 MG CAPS Take 1 capsule by mouth as needed (for allergies).      ondansetron  (ZOFRAN ODT) 4 MG disintegrating tablet Take 1 tablet (4 mg total) by mouth every 8 (eight) hours as needed for nausea or vomiting. 45 tablet 11   PROAIR HFA 108 (90 Base) MCG/ACT inhaler 2 INHALATIONS EVERY 4 HOURS AS NEEDED FOR ASTHMA SYMPTOMS (WHEEZING/SHORTNESS OF BREATH)  5   quiNINE (QUALAQUIN) 324 MG capsule TAKE ONE CAPSULE BY MOUTH TWICE DAILY 180 capsule 3   rOPINIRole (REQUIP) 1 MG tablet TAKE 1 TABLET(1 MG) BY MOUTH TWICE DAILY BEFORE LUNCH AND SUPPER 180 tablet 3   topiramate (TOPAMAX) 100 MG tablet Take 1 tablet (100 mg total) by mouth at bedtime. 30 tablet 11   Vitamin D, Ergocalciferol, (DRISDOL) 50000 units CAPS capsule Take 1 capsule (50,000 Units total) by mouth every 7 (seven) days. 30 capsule 3   zolpidem (AMBIEN) 10 MG tablet      amphetamine-dextroamphetamine (ADDERALL) 10 MG tablet Take 1 tablet (10 mg total) by mouth daily before breakfast. 30 tablet 0   SUMAtriptan (TOSYMRA) 10 MG/ACT SOLN Place 1 spray into the nose once for 1 dose. 1 each 0   No current facility-administered medications for this visit.     Allergies as of 11/23/2019   (No Known Allergies)    Vitals: BP 122/82    Pulse 74    Temp (!) 97.1 F (36.2 C)    Ht 5\' 7"  (1.702 m)    Wt 243 lb (110.2 kg)    BMI 38.06 kg/m  Last Weight:  Wt Readings from Last 1 Encounters:  11/23/19 243 lb (110.2 kg)       Last Height:   Ht Readings from Last 1 Encounters:  11/23/19 5\' 7"  (1.702 m)    Physical exam:  General: The patient is awake, alert and appears not in acute distress. The patient is well groomed. Head:  Normocephalic, atraumatic. Neck is now supple. No tenderness. Mallampati 3 ,  neck circumference: 15 inches. Nasal airflow unrestricted, TMJ is not evident. Retrognathia is seen.  Cardiovascular:  Regular rate and rhythm, without  murmurs or carotid bruit, and without distended neck veins. Respiratory: Lungs are clear to auscultation. Skin:  Without evidence of edema, or rash, well  healed knee replacement scars.  Trunk: BMI is elevated .  Neurologic exam :The patient is awake and alert, oriented to place and time.  Memory subjective described as intact.  There is a normal attention span & concentration ability. Speech is fluent without  dysarthria, dysphonia or aphasia. Mood and affect are appropriate. Cranial nerves: Pupils are equal and briskly reactive to light.  Extraocular movements  in vertical and horizontal planes intact ,without nystagmus.  Visual fields by finger perimetry are intact. Hearing to finger rub intact. Facial motor strength is symmetric and tongue and uvula move midline.  Shoulder shrug intact . Motor exam:  Normal tone, no cog-wheeling, muscle bulk and symmetric strength in all extremities. No cog wheeling and no waxy or clasp knife response.  Sensory:  Fine touch, pinprick and vibration were tested in all extremities. Proprioception in both feet is affeced by pin and needle dysesthesias.  Coordination: Rapid alternating movements in the fingers/hands is normal. Finger-to-nose maneuver normal without evidence of ataxia, dysmetria or tremor. Gait and station: Patient walks without assistive device .  Strength within normal limits. Stance is stable and normal.  Deep tendon reflexes: in the  upper and lower extremities are symmetric. Babinski maneuver deferred.   Assessment:  After physical and neurologic examination, review of laboratory studies, imaging, neurophysiology testing and pre-existing records, assessment is   The patient was advised of the nature of the diagnosed sleep disorder , the treatment options and risks for general a health and wellness arising from not treating the condition. Visit duration was 40 minutes.  Of today's new patient visit-consultation 50% of our face-to-face time are spent in discussion of the medical conditions found the differential diagnosis the testing and treatment options for the conditions. The patient is  willing to undergo a new iron infusion cycle .  Her cardiologists note's support this Pernell Dupre ,MD ) , Dr. Waymon Budge.   Since Mrs. Vossler has undergone a gastric bypass surgery in the past she has some mild absorption issues.  She is chronically iron deficient, she has been vitamin D deficient she has been vitamin B12 deficient. I have prescribed her a vitamin B12 nasal spray, vitamin D 50,000 units weekly pill, and I will obtain iron studies today to see which dose of an iron infusion would be indicated for her.  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 I would like for her to meet with a cardiologist to discuss the relevance to her MRI findings as well as to her clinical symptoms of headaches. The pain in the neck and headaches have both improved and I hope that she can continue Botox therapy through Dr. Elnita Maxwell as well as trigger point injections. Nerve blocks for neuralgia are also ordered. A download of her machine was not yet obtained but I reviewed her restless leg symptomatic and her RLS rating scale was endorsed at very low impediment there is no evidence at this time of major quality of life impairment based on restless leg intensity. She is not depressed. Is in the process of losing weight and she appears  optimistic and motivated. I'm especially proud that she weaned herself off all narcotics.  Plan:  Treatment plan and additional workup :  1 RLS with iron deficiency ) Dr. Beryle Beams  evaluated her discrepancy between very low ferritin levels  and apparently sufficient iron storage. She has maintained oral iron intake as well as multivitamins and multi minerals. Repeat ferritin levels, order iron IV.   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 She is not diabetic she has no documented thyroid disease and neither did her mother nor has her daughter but all of these women half severe painful dysesthesias  leg cramps and restless legs. She took quinine with good success, but had to order from [place].   3) OSA- continue on CPAP, auto PAP . Doing well ! The patient's auto CPAP is set between 7 and 15 cm water with 3 cm EPR she is 83% compliance with an average user time of 6 hours and 10 minutes. Residual AHI is 3.6. 91st percentile pressure is 13.9. New machine due in 01-2020. HST ordered for january.   4) HA- 2 strokes , coincidential finding during headache work up, March 2017. New dizziness spells, ataxia, constant headaches - will obtain new MRI brain, MRA and she will continue on AIMOVIG.   Rv in 3-4  month. With me.   I thank Dr [name] for her headache expertise and treatment.  The patient has been relieved of neck pain and occipital neuralgic pain and pain with radiculopathic distribution to the shoulders.    Asencion Partridge Librado Guandique MD  11/23/2019

## 2019-11-23 NOTE — Telephone Encounter (Signed)
Pt requesting a call to discuss coming in for another botox appt

## 2019-11-23 NOTE — Telephone Encounter (Signed)
Patient had apt today and Dr Brett Fairy wanted to get her set up for iron infusion. Order placed and given to infusion suite for the patient for  Venofer 100 mg IV once.

## 2019-11-24 LAB — IRON,TIBC AND FERRITIN PANEL
Ferritin: 21 ng/mL (ref 15–150)
Iron Saturation: 14 % — ABNORMAL LOW (ref 15–55)
Iron: 50 ug/dL (ref 27–139)
Total Iron Binding Capacity: 353 ug/dL (ref 250–450)
UIBC: 303 ug/dL (ref 118–369)

## 2019-11-24 NOTE — Telephone Encounter (Signed)
Noted, thank you

## 2019-11-24 NOTE — Telephone Encounter (Signed)
Spoke with the patient. She has been doing great overall, still having headaches. She missed injections d/t COVID, staying home, concerned to go out. I scheduled her for first available with Dr. [name] per pt request. 01/10/20 @ 11:30 AM. Pt on wait list in case of sooner availability.

## 2019-11-24 NOTE — Progress Notes (Signed)
Low iron level- low ferritin- under 50 ng/ml is known to worsen RLS.  Schedule iron infusion.

## 2019-12-08 ENCOUNTER — Ambulatory Visit (INDEPENDENT_AMBULATORY_CARE_PROVIDER_SITE_OTHER): Payer: Medicare Other | Admitting: *Deleted

## 2019-12-08 DIAGNOSIS — I480 Paroxysmal atrial fibrillation: Secondary | ICD-10-CM | POA: Diagnosis not present

## 2019-12-08 LAB — CUP PACEART REMOTE DEVICE CHECK
Date Time Interrogation Session: 20201224083907
Implantable Pulse Generator Implant Date: 20171206

## 2019-12-10 NOTE — Progress Notes (Signed)
ILR remote 

## 2019-12-14 ENCOUNTER — Telehealth: Payer: Self-pay | Admitting: Neurology

## 2019-12-14 NOTE — Telephone Encounter (Signed)
Pt called wanting to know if her lab results are in. Please advise. °

## 2019-12-14 NOTE — Telephone Encounter (Signed)
Called the patient back and there was no answer. VM was full unable to leave a message.  When patient calls back I wanted to clairify if she is referring to the 12/9 labs that were taking at the office visit?  If so lab indicated Low iron level was normal but on lower side- low ferritin- level was 14 which under 50 ng/ml is known to worsen RLS.   Schedule iron  infusion. I had placed orders for the patient to have iron infusion completed on the day of the office visit. Has the patient had the iron infusion yet? If no please transfer to infusion suite to get scheduled.

## 2019-12-15 ENCOUNTER — Other Ambulatory Visit: Payer: Self-pay | Admitting: Interventional Cardiology

## 2019-12-21 NOTE — Telephone Encounter (Signed)
Pt called back and was informed. Pt understood but wanted to speak to RN regarding how to get the Quinine Sulfate from [place]. Please advise. Pt was transferred to Infusion suite to schedule Iron Infusion.

## 2019-12-22 ENCOUNTER — Other Ambulatory Visit: Payer: Self-pay | Admitting: Neurology

## 2019-12-22 ENCOUNTER — Telehealth: Payer: Self-pay | Admitting: Neurology

## 2019-12-22 MED ORDER — QUININE SULFATE 324 MG PO CAPS: 324.0000 mg | ORAL_CAPSULE | Freq: Two times a day (BID) | ORAL | 3 refills | Status: DC

## 2019-12-22 NOTE — Telephone Encounter (Signed)
Spoke with Dr Brett Fairy and she has found a pharmacy called [facility] in [place] that offers a mail out option for patient's to get medication. Their number is [phone]. I will attempt to contact that pharmacy to ask how this process works and if a script can be faxed to be completed and mailed out to patient.

## 2019-12-23 NOTE — Telephone Encounter (Signed)
While I was on the phone with the patient she asked questions in regards to her repatha and her aimovig. Advised that with aimovig she could contact the [employer] safety net and check status. Advised I would pass along information to bethany Dr Cathren Laine nurse to discuss further. She verbalized understanding.

## 2019-12-23 NOTE — Telephone Encounter (Signed)
Called the patient to inform her that Dr Brett Fairy and I discussed the pharmacy in [place] which is [facility]. Advised the patient that she could contact them to ask if they will provide the mail service option and if so to get there fax number and we can send them a script for them to fill for her.  Patient asked if she wanted to get it locally what her options were. I advised that with the medication being denied over and over again that we could send it in and use a good rx coupon possibly. I reviewed with her that [facility] had the best cost and printed their good rx coupon. Patient asked me to send it in to them for now. Fax sent with good rx

## 2019-12-27 NOTE — Telephone Encounter (Addendum)
Cardiology was handling the [place]. Application for Aimovig assistance for 2021 was faxed to [place] on 10/25/2019. I spoke with [place] at [employer]. Pt approved for Aimovig assistance through 12/14/2020. They had recently left the pt a message informing her she was approved for 2021. I spoke with the pt. She confirmed she got the vm yesterday and will call [employer] to setup shipping. I also advised the pt to reach out to cardiologist Dr. Thompson Caul office for the [place] assistance. Pt aware in October 2019 our office had spoken with them and they were going to help the pt with assistance. She verbalized appreciation. Confirmed next appt with Dr. [name] on 01/10/20 @ 11:30 AM.

## 2020-01-09 ENCOUNTER — Ambulatory Visit (INDEPENDENT_AMBULATORY_CARE_PROVIDER_SITE_OTHER): Payer: Medicare Other | Admitting: *Deleted

## 2020-01-09 DIAGNOSIS — M25512 Pain in left shoulder: Secondary | ICD-10-CM | POA: Diagnosis not present

## 2020-01-09 DIAGNOSIS — I480 Paroxysmal atrial fibrillation: Secondary | ICD-10-CM | POA: Diagnosis not present

## 2020-01-09 LAB — CUP PACEART REMOTE DEVICE CHECK
Date Time Interrogation Session: 20210124231016
Implantable Pulse Generator Implant Date: 20171206

## 2020-01-10 ENCOUNTER — Other Ambulatory Visit: Payer: Self-pay

## 2020-01-10 ENCOUNTER — Other Ambulatory Visit: Payer: Self-pay | Admitting: Neurology

## 2020-01-10 ENCOUNTER — Ambulatory Visit (INDEPENDENT_AMBULATORY_CARE_PROVIDER_SITE_OTHER): Payer: Medicare Other | Admitting: Neurology

## 2020-01-10 VITALS — Temp 97.2°F | Ht 67.0 in | Wt 239.0 lb

## 2020-01-10 DIAGNOSIS — G43711 Chronic migraine without aura, intractable, with status migrainosus: Secondary | ICD-10-CM | POA: Diagnosis not present

## 2020-01-10 DIAGNOSIS — D509 Iron deficiency anemia, unspecified: Secondary | ICD-10-CM

## 2020-01-10 DIAGNOSIS — D501 Sideropenic dysphagia: Secondary | ICD-10-CM

## 2020-01-10 DIAGNOSIS — R790 Abnormal level of blood mineral: Secondary | ICD-10-CM

## 2020-01-10 DIAGNOSIS — G2581 Restless legs syndrome: Secondary | ICD-10-CM

## 2020-01-10 NOTE — Progress Notes (Signed)
Consent Form Botulism Toxin Injection For Chronic Migraine  Interval history 01/10/2020: Patient's migraines improved on botox > 70% in severity and frequency but we have been delayed due to covid for many months. +a  Reviewed orally with patient, additionally signature is on file:  Botulism toxin has been approved by the Federal drug administration for treatment of chronic migraine. Botulism toxin does not cure chronic migraine and it may not be effective in some patients.  The administration of botulism toxin is accomplished by injecting a small amount of toxin into the muscles of the neck and head. Dosage must be titrated for each individual. Any benefits resulting from botulism toxin tend to wear off after 3 months with a repeat injection required if benefit is to be maintained. Injections are usually done every 3-4 months with maximum effect peak achieved by about 2 or 3 weeks. Botulism toxin is expensive and you should be sure of what costs you will incur resulting from the injection.  The side effects of botulism toxin use for chronic migraine may include:   -Transient, and usually mild, facial weakness with facial injections  -Transient, and usually mild, head or neck weakness with head/neck injections  -Reduction or loss of forehead facial animation due to forehead muscle weakness  -Eyelid drooping  -Dry eye  -Pain at the site of injection or bruising at the site of injection  -Double vision  -Potential unknown long term risks  Contraindications: You should not have Botox if you are pregnant, nursing, allergic to albumin, have an infection, skin condition, or muscle weakness at the site of the injection, or have myasthenia gravis, Lambert-Eaton syndrome, or ALS.  It is also possible that as with any injection, there may be an allergic reaction or no effect from the medication. Reduced effectiveness after repeated injections is sometimes seen and rarely infection at the injection  site may occur. All care will be taken to prevent these side effects. If therapy is given over a long time, atrophy and wasting in the muscle injected may occur. Occasionally the patient's become refractory to treatment because they develop antibodies to the toxin. In this event, therapy needs to be modified.  I have read the above information and consent to the administration of botulism toxin.    BOTOX PROCEDURE NOTE FOR MIGRAINE HEADACHE    Contraindications and precautions discussed with patient(above). Aseptic procedure was observed and patient tolerated procedure. Procedure performed by Dr. Georgia Dom  The condition has existed for more than 6 months, and pt does not have a diagnosis of ALS, Myasthenia Gravis or Lambert-Eaton Syndrome.  Risks and benefits of injections discussed and pt agrees to proceed with the procedure.  Written consent obtained  These injections are medically necessary. Pt  receives good benefits from these injections. These injections do not cause sedations or hallucinations which the oral therapies may cause.  Description of procedure:  The patient was placed in a sitting position. The standard protocol was used for Botox as follows, with 5 units of Botox injected at each site:   -Procerus muscle, midline injection  -Corrugator muscle, bilateral injection  -Frontalis muscle, bilateral injection, with 2 sites each side, medial injection was performed in the upper one third of the frontalis muscle, in the region vertical from the medial inferior edge of the superior orbital rim. The lateral injection was again in the upper one third of the forehead vertically above the lateral limbus of the cornea, 1.5 cm lateral to the medial injection site.  -  Temporalis muscle injection, 5 sites, bilaterally. The first injection was 3 cm above the tragus of the ear, second injection site was 1.5 cm to 3 cm up from the first injection site in line with the tragus of the ear. The  third injection site was 1.5-3 cm forward between the first 2 injection sites. The fourth injection site was 1.5 cm posterior to the second injection site. 5th site laterally in the temporalis  muscleat the level of the outer canthus.  - Patient feels her clenching is a trigger for headaches. +5 units masseter bilaterally   - Patient feels the migraines are centered around the eyes +5 units bilaterally at the outer canthus in the orbicularis occuli  -Occipitalis muscle injection, 3 sites, bilaterally. The first injection was done one half way between the occipital protuberance and the tip of the mastoid process behind the ear. The second injection site was done lateral and superior to the first, 1 fingerbreadth from the first injection. The third injection site was 1 fingerbreadth superiorly and medially from the first injection site.  -Cervical paraspinal muscle injection, 2 sites, bilateral knee first injection site was 1 cm from the midline of the cervical spine, 3 cm inferior to the lower border of the occipital protuberance. The second injection site was 1.5 cm superiorly and laterally to the first injection site.  -Trapezius muscle injection was performed at 3 sites, bilaterally. The first injection site was in the upper trapezius muscle halfway between the inflection point of the neck, and the acromion. The second injection site was one half way between the acromion and the first injection site. The third injection was done between the first injection site and the inflection point of the neck.   Will return for repeat injection in 3 months.   A 200 unit sof Botox was used, any Botox not injected was wasted. The patient tolerated the procedure well, there were no complications of the above procedure.

## 2020-01-10 NOTE — Progress Notes (Signed)
Botox- 200 units x 1 vial Lot: [id number] Expiration: 09/2022 NDC: 8685-4883-01  Bacteriostatic 0.9% Sodium Chloride- 18mL total Lot: [id number] Expiration: 03/15/2020 NDC: 3125-0871-99  Dx: O12.904 B/B

## 2020-01-11 DIAGNOSIS — Z9884 Bariatric surgery status: Secondary | ICD-10-CM | POA: Diagnosis not present

## 2020-01-11 DIAGNOSIS — Z79899 Other long term (current) drug therapy: Secondary | ICD-10-CM | POA: Diagnosis not present

## 2020-01-11 DIAGNOSIS — E559 Vitamin D deficiency, unspecified: Secondary | ICD-10-CM | POA: Diagnosis not present

## 2020-01-11 DIAGNOSIS — N183 Chronic kidney disease, stage 3 unspecified: Secondary | ICD-10-CM | POA: Diagnosis not present

## 2020-01-11 DIAGNOSIS — M109 Gout, unspecified: Secondary | ICD-10-CM | POA: Diagnosis not present

## 2020-01-11 DIAGNOSIS — E538 Deficiency of other specified B group vitamins: Secondary | ICD-10-CM | POA: Diagnosis not present

## 2020-01-11 DIAGNOSIS — G2581 Restless legs syndrome: Secondary | ICD-10-CM | POA: Diagnosis not present

## 2020-01-11 DIAGNOSIS — I639 Cerebral infarction, unspecified: Secondary | ICD-10-CM | POA: Diagnosis not present

## 2020-01-11 DIAGNOSIS — D509 Iron deficiency anemia, unspecified: Secondary | ICD-10-CM | POA: Diagnosis not present

## 2020-01-16 ENCOUNTER — Other Ambulatory Visit: Payer: Self-pay | Admitting: Interventional Cardiology

## 2020-01-19 DIAGNOSIS — M47816 Spondylosis without myelopathy or radiculopathy, lumbar region: Secondary | ICD-10-CM | POA: Diagnosis not present

## 2020-01-19 DIAGNOSIS — Z7189 Other specified counseling: Secondary | ICD-10-CM | POA: Diagnosis not present

## 2020-01-19 DIAGNOSIS — E559 Vitamin D deficiency, unspecified: Secondary | ICD-10-CM | POA: Diagnosis not present

## 2020-01-19 DIAGNOSIS — J45909 Unspecified asthma, uncomplicated: Secondary | ICD-10-CM | POA: Diagnosis not present

## 2020-01-19 DIAGNOSIS — I10 Essential (primary) hypertension: Secondary | ICD-10-CM | POA: Diagnosis not present

## 2020-01-19 DIAGNOSIS — G4733 Obstructive sleep apnea (adult) (pediatric): Secondary | ICD-10-CM | POA: Diagnosis not present

## 2020-01-19 DIAGNOSIS — M1990 Unspecified osteoarthritis, unspecified site: Secondary | ICD-10-CM | POA: Diagnosis not present

## 2020-01-19 DIAGNOSIS — G43909 Migraine, unspecified, not intractable, without status migrainosus: Secondary | ICD-10-CM | POA: Diagnosis not present

## 2020-01-19 DIAGNOSIS — E7800 Pure hypercholesterolemia, unspecified: Secondary | ICD-10-CM | POA: Diagnosis not present

## 2020-01-19 DIAGNOSIS — G2581 Restless legs syndrome: Secondary | ICD-10-CM | POA: Diagnosis not present

## 2020-01-19 DIAGNOSIS — D509 Iron deficiency anemia, unspecified: Secondary | ICD-10-CM | POA: Diagnosis not present

## 2020-01-19 DIAGNOSIS — G243 Spasmodic torticollis: Secondary | ICD-10-CM | POA: Diagnosis not present

## 2020-02-01 DIAGNOSIS — K902 Blind loop syndrome, not elsewhere classified: Secondary | ICD-10-CM | POA: Diagnosis not present

## 2020-02-01 DIAGNOSIS — K5080 Crohn's disease of both small and large intestine without complications: Secondary | ICD-10-CM | POA: Diagnosis not present

## 2020-02-01 DIAGNOSIS — M25512 Pain in left shoulder: Secondary | ICD-10-CM | POA: Diagnosis not present

## 2020-02-01 DIAGNOSIS — D508 Other iron deficiency anemias: Secondary | ICD-10-CM | POA: Diagnosis not present

## 2020-02-01 DIAGNOSIS — D519 Vitamin B12 deficiency anemia, unspecified: Secondary | ICD-10-CM | POA: Diagnosis not present

## 2020-02-01 DIAGNOSIS — M7061 Trochanteric bursitis, right hip: Secondary | ICD-10-CM | POA: Diagnosis not present

## 2020-02-09 ENCOUNTER — Ambulatory Visit (INDEPENDENT_AMBULATORY_CARE_PROVIDER_SITE_OTHER): Payer: Medicare Other | Admitting: *Deleted

## 2020-02-09 DIAGNOSIS — I480 Paroxysmal atrial fibrillation: Secondary | ICD-10-CM | POA: Diagnosis not present

## 2020-02-09 LAB — CUP PACEART REMOTE DEVICE CHECK
Date Time Interrogation Session: 20210224234936
Implantable Pulse Generator Implant Date: 20171206

## 2020-02-09 NOTE — Progress Notes (Signed)
ILR Remote 

## 2020-02-10 DIAGNOSIS — M25551 Pain in right hip: Secondary | ICD-10-CM | POA: Diagnosis not present

## 2020-02-13 ENCOUNTER — Other Ambulatory Visit: Payer: Self-pay | Admitting: Neurology

## 2020-02-13 DIAGNOSIS — R1115 Cyclical vomiting syndrome unrelated to migraine: Secondary | ICD-10-CM

## 2020-02-13 DIAGNOSIS — G43001 Migraine without aura, not intractable, with status migrainosus: Secondary | ICD-10-CM

## 2020-02-15 ENCOUNTER — Other Ambulatory Visit: Payer: Self-pay | Admitting: Orthopaedic Surgery

## 2020-02-15 DIAGNOSIS — M25512 Pain in left shoulder: Secondary | ICD-10-CM

## 2020-02-15 DIAGNOSIS — M25551 Pain in right hip: Secondary | ICD-10-CM

## 2020-02-21 ENCOUNTER — Ambulatory Visit: Payer: Medicare Other | Admitting: Neurology

## 2020-03-02 DIAGNOSIS — S79911A Unspecified injury of right hip, initial encounter: Secondary | ICD-10-CM | POA: Diagnosis not present

## 2020-03-02 DIAGNOSIS — M4317 Spondylolisthesis, lumbosacral region: Secondary | ICD-10-CM | POA: Diagnosis not present

## 2020-03-02 DIAGNOSIS — S79912A Unspecified injury of left hip, initial encounter: Secondary | ICD-10-CM | POA: Diagnosis not present

## 2020-03-02 DIAGNOSIS — Z1389 Encounter for screening for other disorder: Secondary | ICD-10-CM | POA: Diagnosis not present

## 2020-03-02 DIAGNOSIS — S3992XA Unspecified injury of lower back, initial encounter: Secondary | ICD-10-CM | POA: Diagnosis not present

## 2020-03-02 DIAGNOSIS — S3982XA Other specified injuries of lower back, initial encounter: Secondary | ICD-10-CM | POA: Diagnosis not present

## 2020-03-05 ENCOUNTER — Other Ambulatory Visit: Payer: Self-pay | Admitting: Family Medicine

## 2020-03-05 DIAGNOSIS — Z1231 Encounter for screening mammogram for malignant neoplasm of breast: Secondary | ICD-10-CM

## 2020-03-09 ENCOUNTER — Ambulatory Visit
Admission: RE | Admit: 2020-03-09 | Discharge: 2020-03-09 | Disposition: A | Discharge status: Home / Self Care | Payer: Medicare Other | Source: Ambulatory Visit | Attending: Orthopaedic Surgery | Admitting: Orthopaedic Surgery

## 2020-03-09 ENCOUNTER — Other Ambulatory Visit: Payer: Self-pay

## 2020-03-09 DIAGNOSIS — M25512 Pain in left shoulder: Secondary | ICD-10-CM

## 2020-03-09 DIAGNOSIS — M25551 Pain in right hip: Secondary | ICD-10-CM | POA: Diagnosis not present

## 2020-03-09 DIAGNOSIS — S46012A Strain of muscle(s) and tendon(s) of the rotator cuff of left shoulder, initial encounter: Secondary | ICD-10-CM | POA: Diagnosis not present

## 2020-03-09 IMAGING — MR MR HIP*R* W/O CM
6 series · 40 of 40 positions shown · non-contrast
Comparison: Pelvic x-rays dated [DATE]. CT abdomen
pelvis dated [DATE]. MRI left hip dated [DATE].

CLINICAL DATA: Acute on chronic right hip pain since fall week ago.

EXAM:
MR OF THE RIGHT HIP WITHOUT CONTRAST
TECHNIQUE: Multiplanar, multisequence MR imaging was performed. No intravenous
contrast was administered.

[Series 3: T1 · coronal · 4.0mm · 1.31mm/px · 7 of 24 slices shown]
[im 1/24]
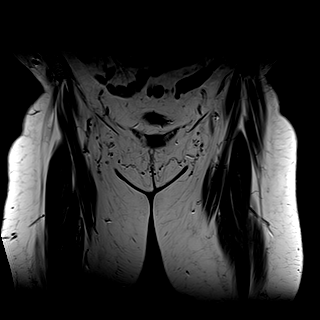
[im 4/24]
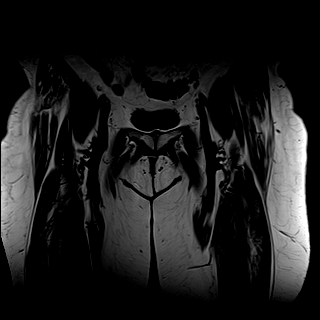
[im 8/24]
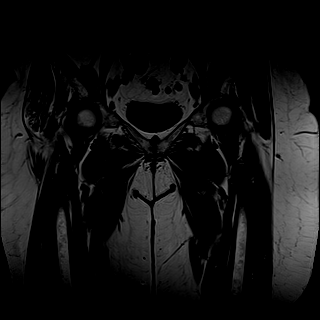
[im 12/24]
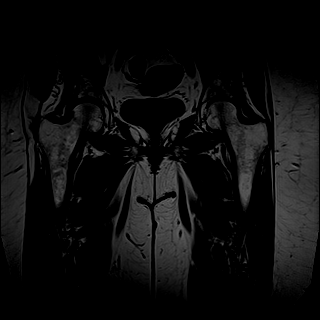
[im 16/24]
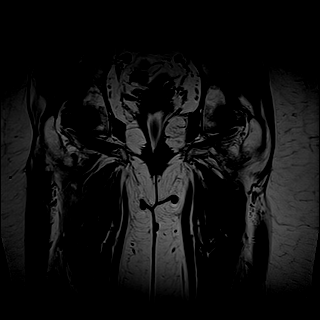
[im 20/24]
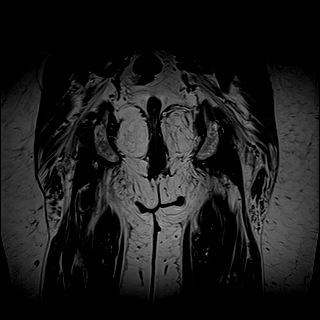
[im 24/24]
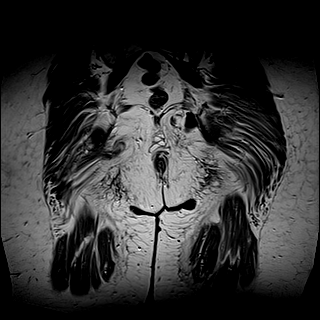

[Series 4: T2 fat-sat · coronal · 4.0mm · 1.31mm/px · 7 of 24 slices shown (1 of 2)]
[im 1/24]
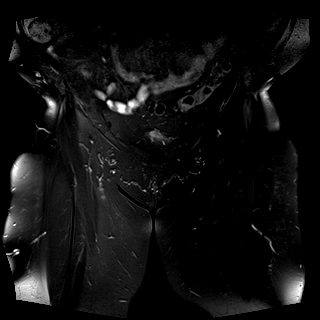
[im 4/24]
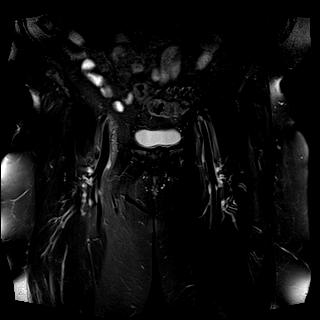
[im 8/24]
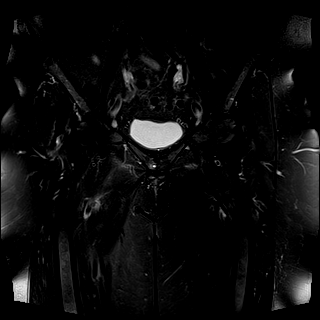
[im 12/24]
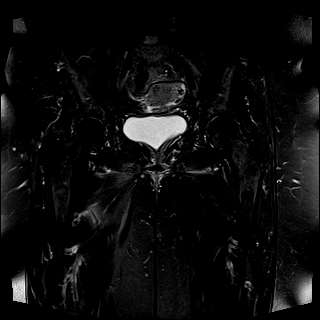
[im 16/24]
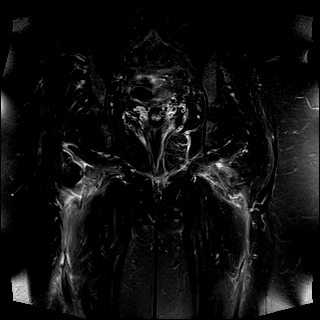
[im 20/24]
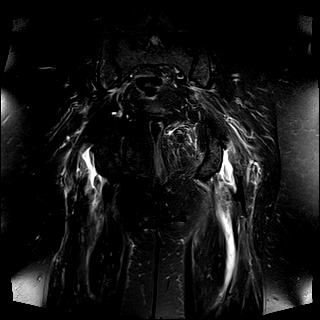
[im 24/24]
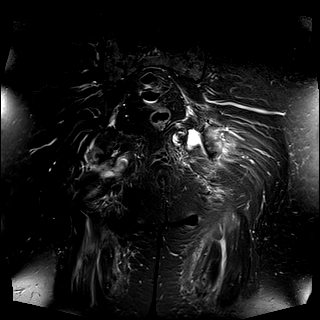

[Series 7: T2 fat-sat · axial · 4.0mm · 0.35mm/px · z∈[-14,+111]mm · 7 of 26 slices shown (2 of 2)]
[im 1/26]
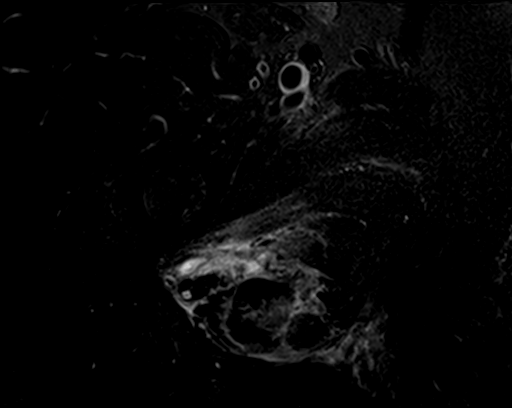
[im 5/26]
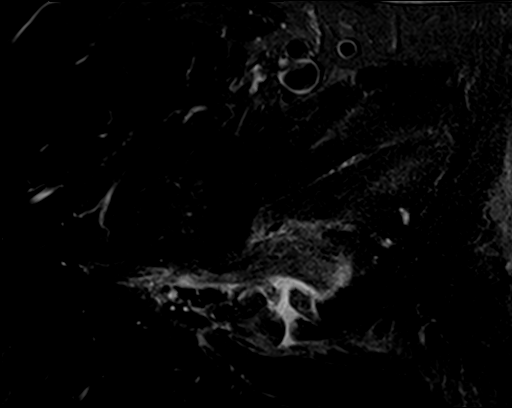
[im 9/26]
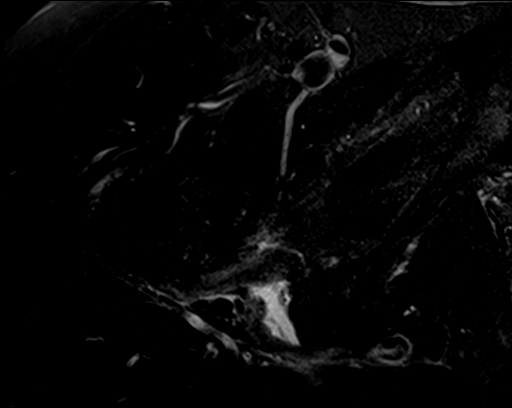
[im 13/26]
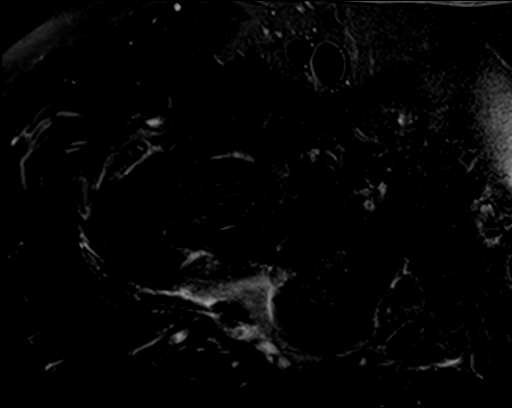
[im 17/26]
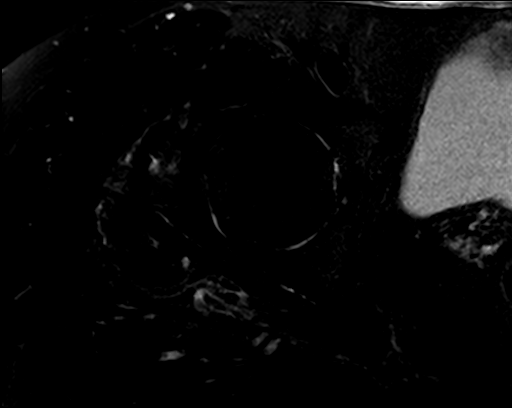
[im 21/26]
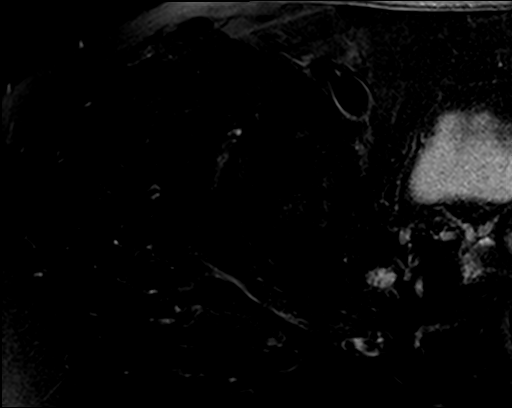
[im 26/26]
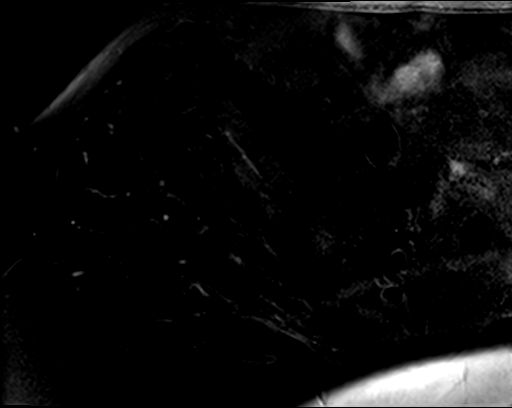

[Series 8: PD fat-sat · sagittal · 4.0mm · 0.70mm/px · 7 of 26 slices shown (1 of 3)]
[im 1/26]
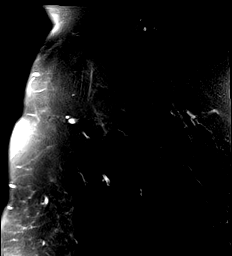
[im 5/26]
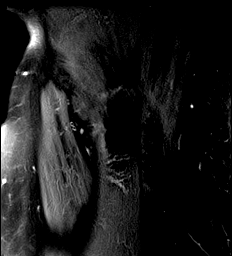
[im 9/26]
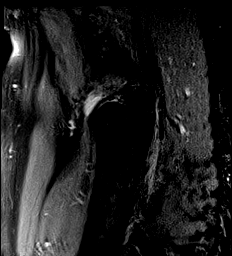
[im 13/26]
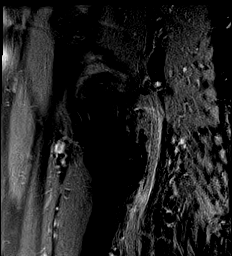
[im 17/26]
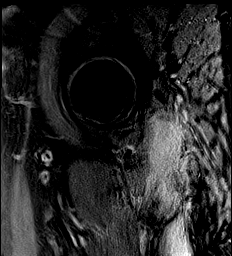
[im 21/26]
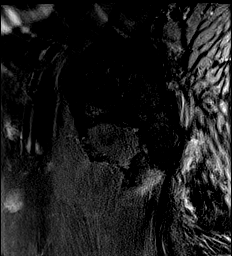
[im 26/26]
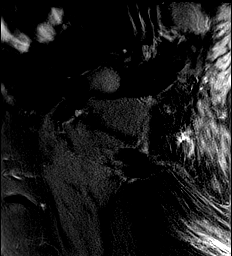

[Series 9: PD fat-sat · coronal · 4.0mm · 0.70mm/px · 5 of 19 slices shown (2 of 3)]
[im 1/19]
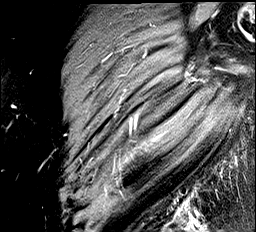
[im 5/19]
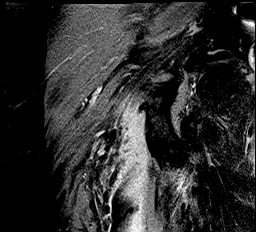
[im 10/19]
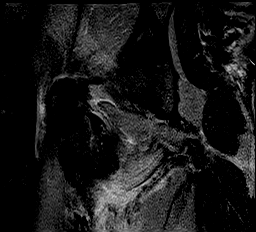
[im 14/19]
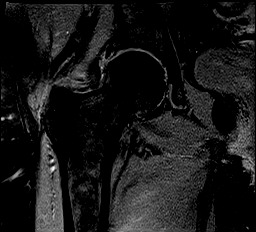
[im 19/19]
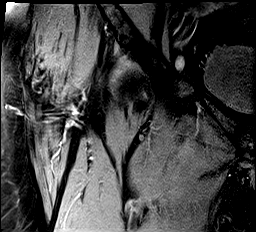

[Series 10: PD fat-sat · oblique · 4.0mm · 0.70mm/px · 7 of 24 slices shown (3 of 3)]
[im 1/24]
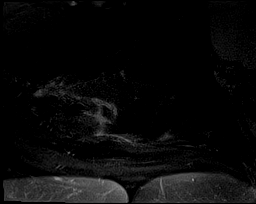
[im 4/24]
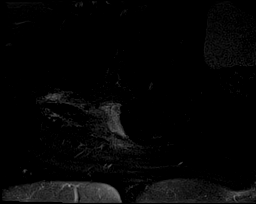
[im 8/24]
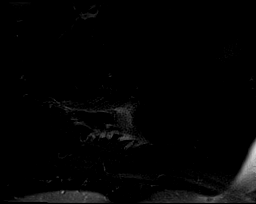
[im 12/24]
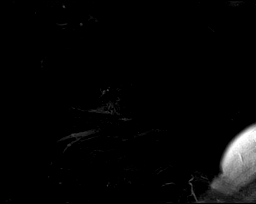
[im 16/24]
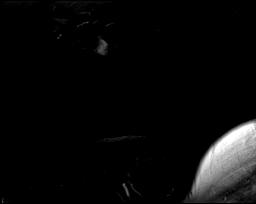
[im 20/24]
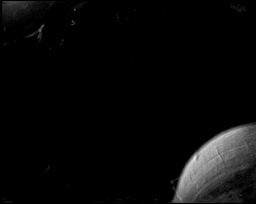
[im 24/24]
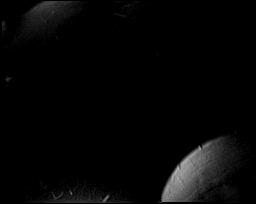

[40 of 40 positions shown; findings below may reference images not displayed]

FINDINGS: Bones: There is no evidence of acute fracture, dislocation or
avascular necrosis. No focal bone lesion. The visualized sacroiliac
joints appear normal. Degenerative changes of the pubic symphysis.

Articular cartilage and labrum

Articular cartilage: Mild partial-thickness cartilage loss and
marginal spurring of both hip joints.

Labrum: Chronic degenerative tears of the bilateral anterior
superior labra.

Joint or bursal effusion

Joint effusion: No significant hip joint effusion.

Bursae: No focal periarticular fluid collection.

Muscles and tendons

Muscles and tendons: Essentially complete tears of both hamstring
tendon origins with around 3 cm of retraction, progressed since
[DATE]. Surrounding edema and hemorrhage. Edema in the right
greater than left adductor and proximal hamstring muscles.

New moderate tendinosis of the right gluteus minimus tendon. The
left gluteus and bilateral iliopsoas tendons appear normal. No
muscle atrophy.

Other findings

Miscellaneous: Sigmoid diverticulosis.
IMPRESSION: 1. Essentially complete tears of both hamstring tendon origins with
around 3 cm of retraction, progressed since [DATE]. New moderate right gluteus minimus tendinosis.
3. Unchanged mild bilateral hip osteoarthritis.

## 2020-03-09 IMAGING — MR MR SHOULDER*L* W/O CM
4 of 5 series · 27 of 40 positions shown · non-contrast
Comparison: Left shoulder x-rays dated [DATE].

CLINICAL DATA: Acute on chronic left shoulder pain after fall week
ago.

EXAM:
MRI OF THE LEFT SHOULDER WITHOUT CONTRAST
TECHNIQUE: Multiplanar, multisequence MR imaging of the shoulder was performed.
No intravenous contrast was administered.

[Series 3: PD fat-sat · axial · 4.0mm · 0.27mm/px · z∈[-77,+12]mm · 8 of 20 slices shown (1 of 2)]
[im 1/20]
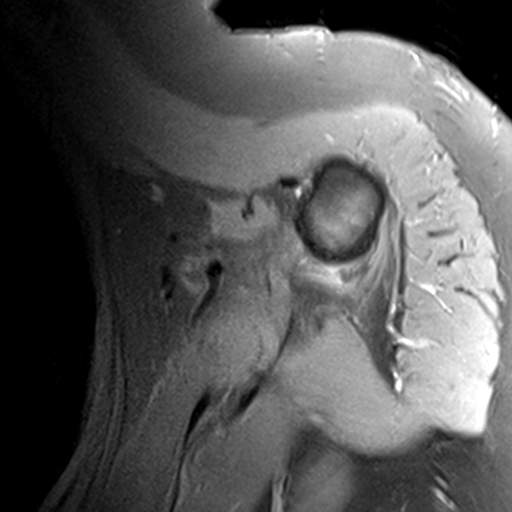
[im 3/20]
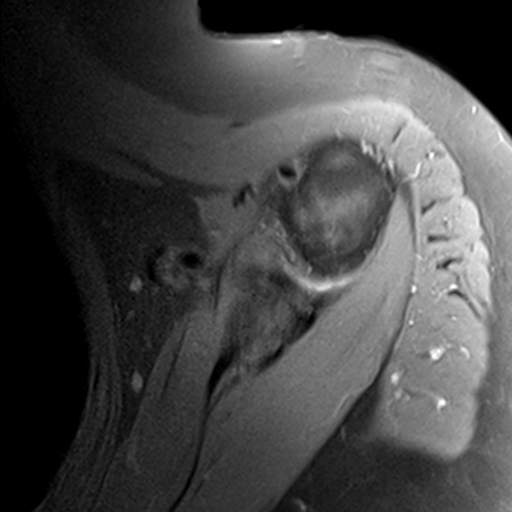
[im 6/20]
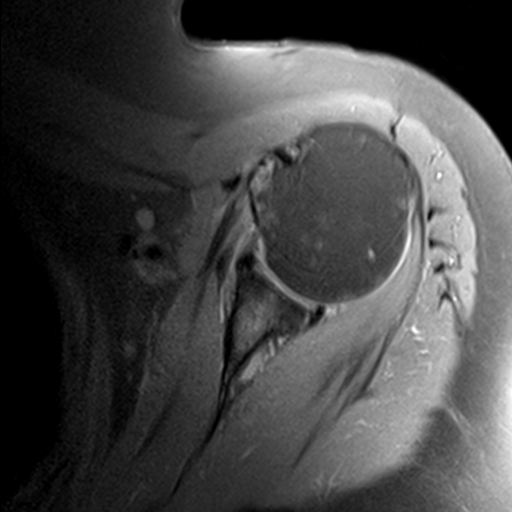
[im 9/20]
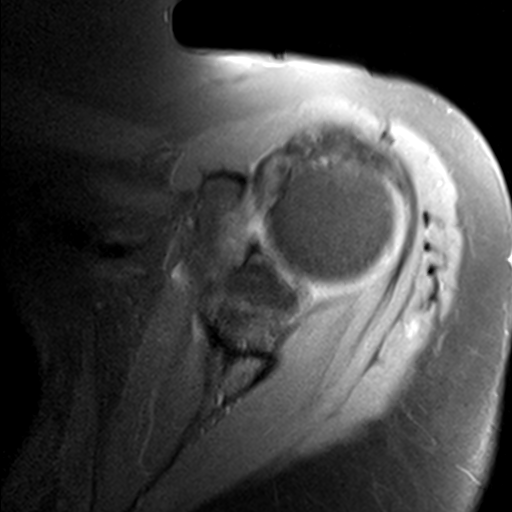
[im 11/20]
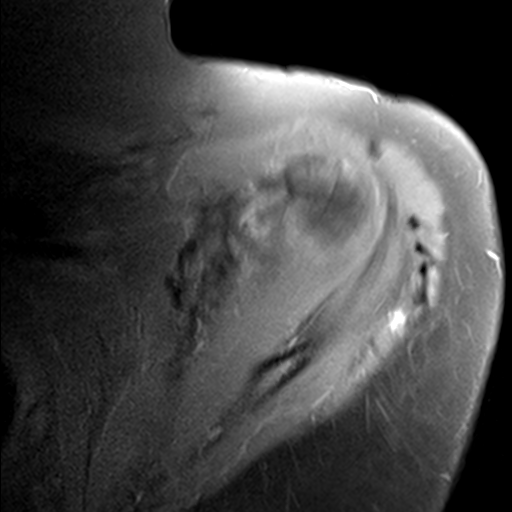
[im 14/20]
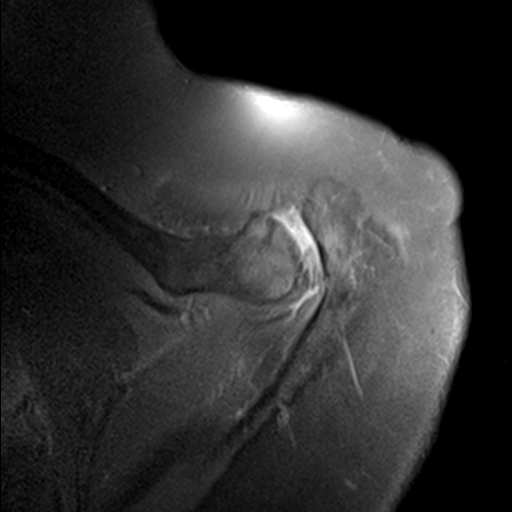
[im 17/20]
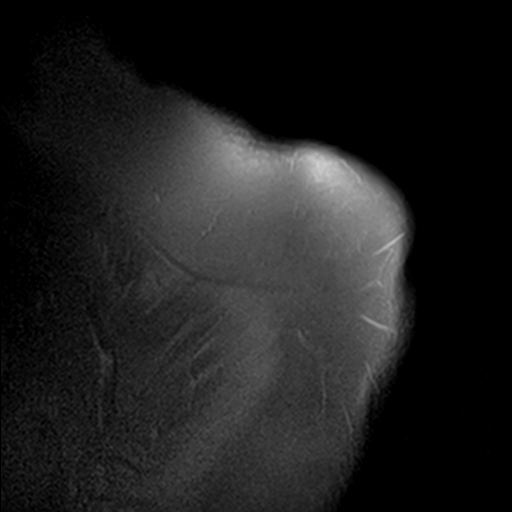
[im 20/20]
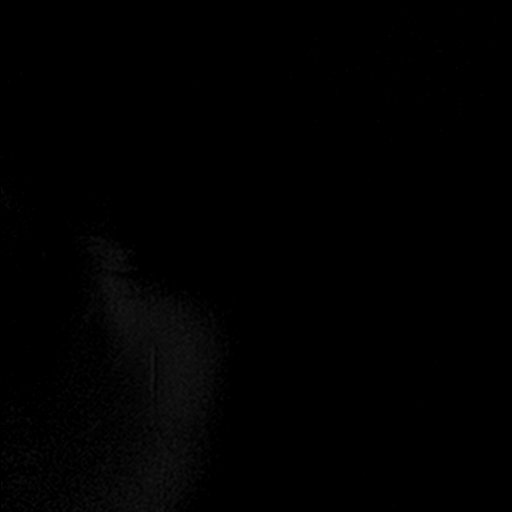

[Series 4: T2 fat-sat · oblique · 4.0mm · 0.55mm/px · 8 of 17 slices shown (1 of 2)]
[im 1/17]
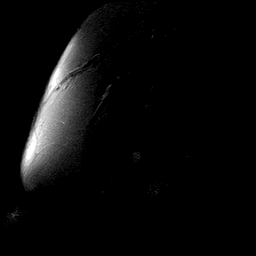
[im 3/17]
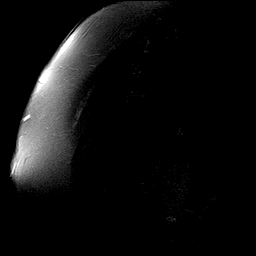
[im 5/17]
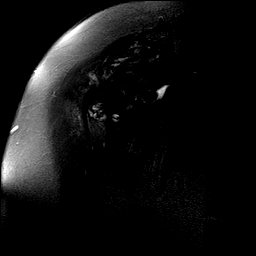
[im 7/17]
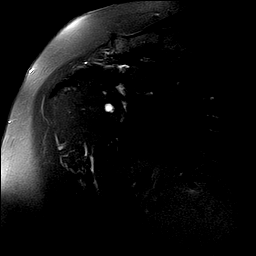
[im 10/17]
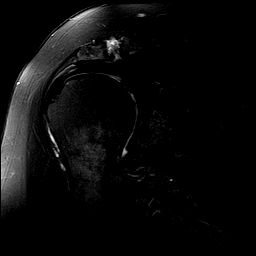
[im 12/17]
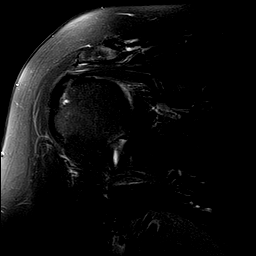
[im 14/17]
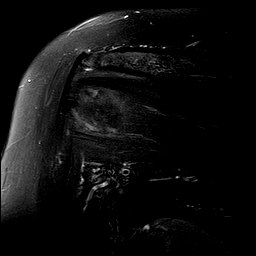
[im 17/17]
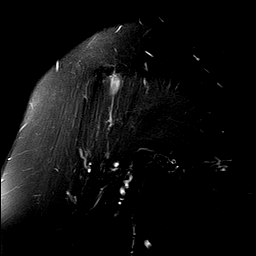

[Series 5: PD fat-sat · oblique · 4.0mm · 0.27mm/px · 8 of 17 slices shown (2 of 2)]
[im 1/17]
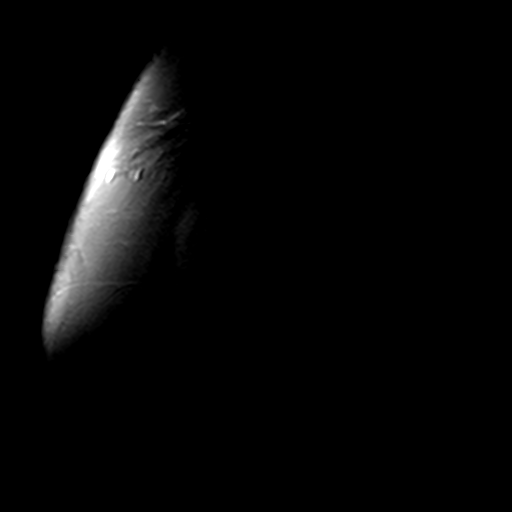
[im 3/17]
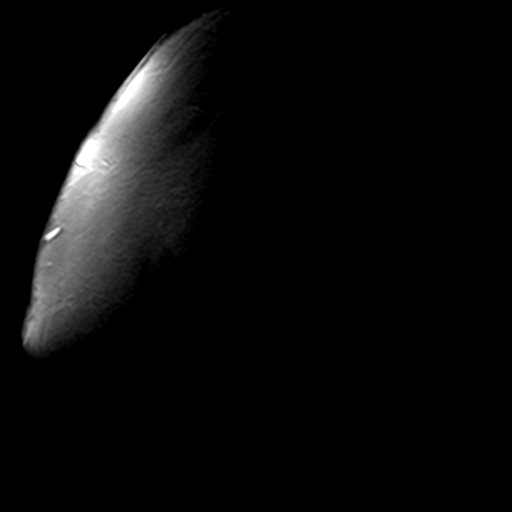
[im 5/17]
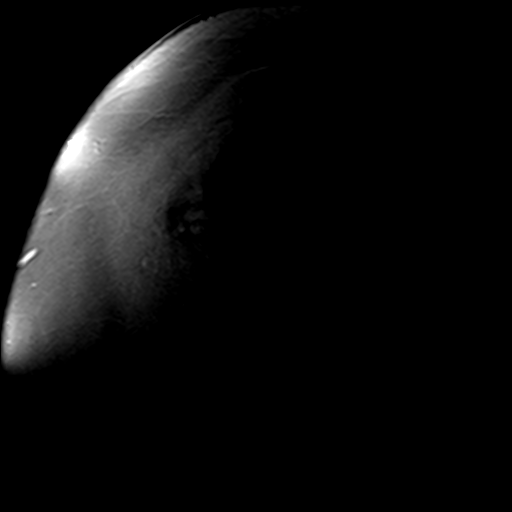
[im 7/17]
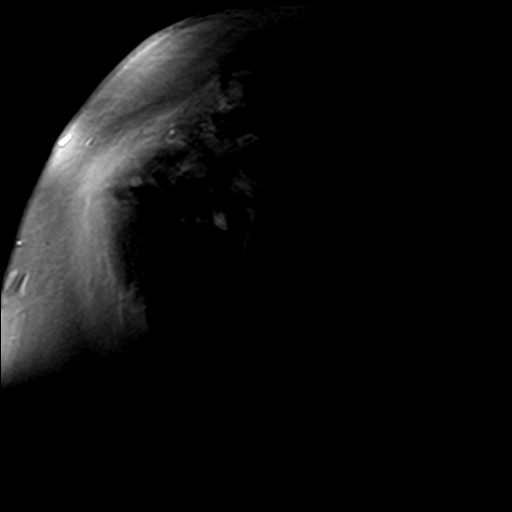
[im 10/17]
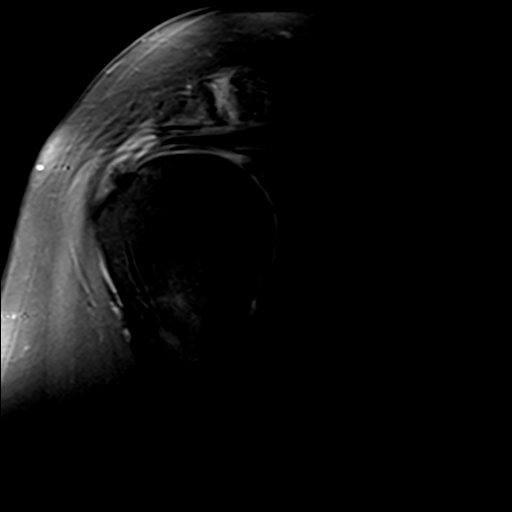
[im 12/17]
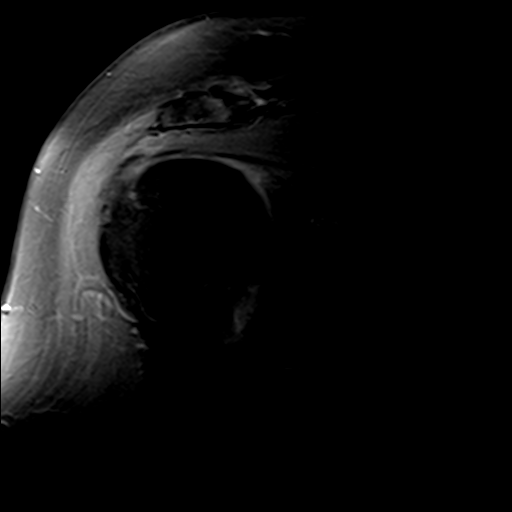
[im 14/17]
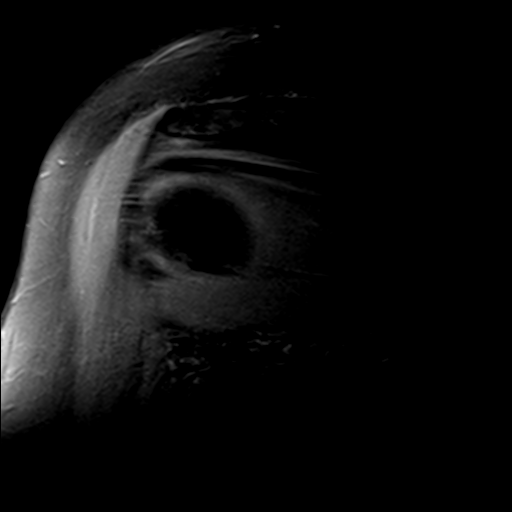
[im 17/17]
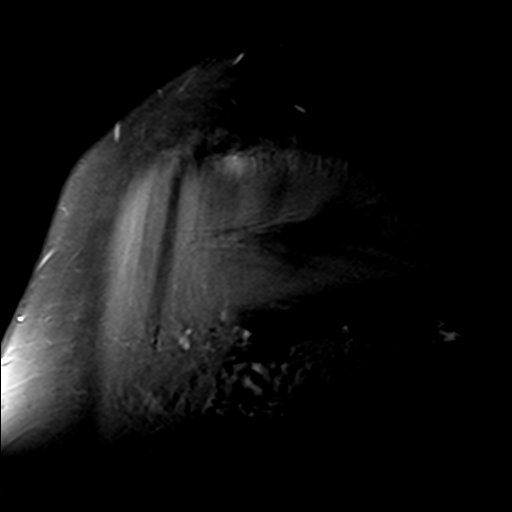

[Series 6: T2 fat-sat · coronal · 4.0mm · 0.55mm/px · 3 of 17 slices shown (2 of 2)]
[im 3/17]
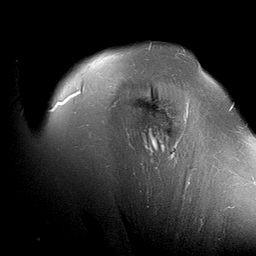
[im 10/17]
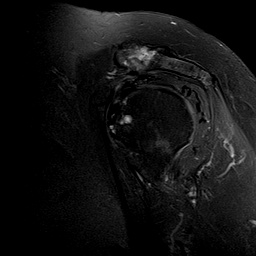
[im 14/17]
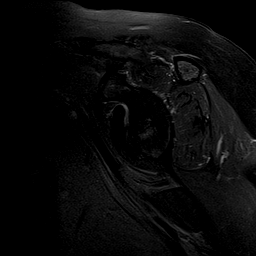

[27 of 40 positions shown; findings below may reference images not displayed]

FINDINGS: Rotator cuff: Mild supraspinatus tendinosis with small
intrasubstance tear of the mid tendon. Mild subscapularis
tendinosis. The infraspinatus and teres minor tendons are
unremarkable.

Muscles: No atrophy or abnormal signal of the muscles of the rotator
cuff.

Biceps long head: Intact and normally positioned. Severe
intra-articular tendinosis.

Acromioclavicular Joint: Mild arthropathy of the acromioclavicular
joint. Type II acromion. Trace subacromial/subdeltoid bursal fluid.

Glenohumeral Joint: No joint effusion. Mild partial-thickness
cartilage loss.

Labrum:  Degenerated but grossly intact.

Bones: No acute fracture or dislocation. No suspicious bone lesion.
Reactive subcortical cystic change in the lesser tuberosity.

Other: None.
IMPRESSION: 1. Mild supraspinatus tendinosis with small intrasubstance tear of
the mid tendon.
2. Severe intra-articular biceps tendinosis.
3. Mild acromioclavicular and glenohumeral osteoarthritis.

## 2020-03-11 LAB — CUP PACEART REMOTE DEVICE CHECK
Date Time Interrogation Session: 20210328021343
Implantable Pulse Generator Implant Date: 20171206

## 2020-03-12 ENCOUNTER — Ambulatory Visit (INDEPENDENT_AMBULATORY_CARE_PROVIDER_SITE_OTHER): Payer: Medicare Other | Admitting: *Deleted

## 2020-03-12 DIAGNOSIS — M7062 Trochanteric bursitis, left hip: Secondary | ICD-10-CM | POA: Diagnosis not present

## 2020-03-12 DIAGNOSIS — I480 Paroxysmal atrial fibrillation: Secondary | ICD-10-CM | POA: Diagnosis not present

## 2020-03-12 DIAGNOSIS — M7061 Trochanteric bursitis, right hip: Secondary | ICD-10-CM | POA: Diagnosis not present

## 2020-03-12 DIAGNOSIS — M25512 Pain in left shoulder: Secondary | ICD-10-CM | POA: Diagnosis not present

## 2020-03-12 NOTE — Progress Notes (Signed)
ILR Remote 

## 2020-03-20 ENCOUNTER — Ambulatory Visit
Admission: RE | Admit: 2020-03-20 | Discharge: 2020-03-20 | Disposition: A | Discharge status: Home / Self Care | Payer: Medicare Other | Source: Ambulatory Visit | Attending: Family Medicine | Admitting: Family Medicine

## 2020-03-20 ENCOUNTER — Other Ambulatory Visit: Payer: Self-pay

## 2020-03-20 DIAGNOSIS — Z1231 Encounter for screening mammogram for malignant neoplasm of breast: Secondary | ICD-10-CM | POA: Diagnosis not present

## 2020-03-20 IMAGING — MG DIGITAL SCREENING BILAT W/ TOMO W/ CAD
8 series · 8 of 24 positions shown · non-contrast
Comparison: Previous exam(s).

ACR Breast Density Category a: The breast tissue is almost entirely
fatty.

CLINICAL DATA: Screening.

EXAM:
DIGITAL SCREENING BILATERAL MAMMOGRAM WITH TOMO AND CAD

[R MLO synth-2D]
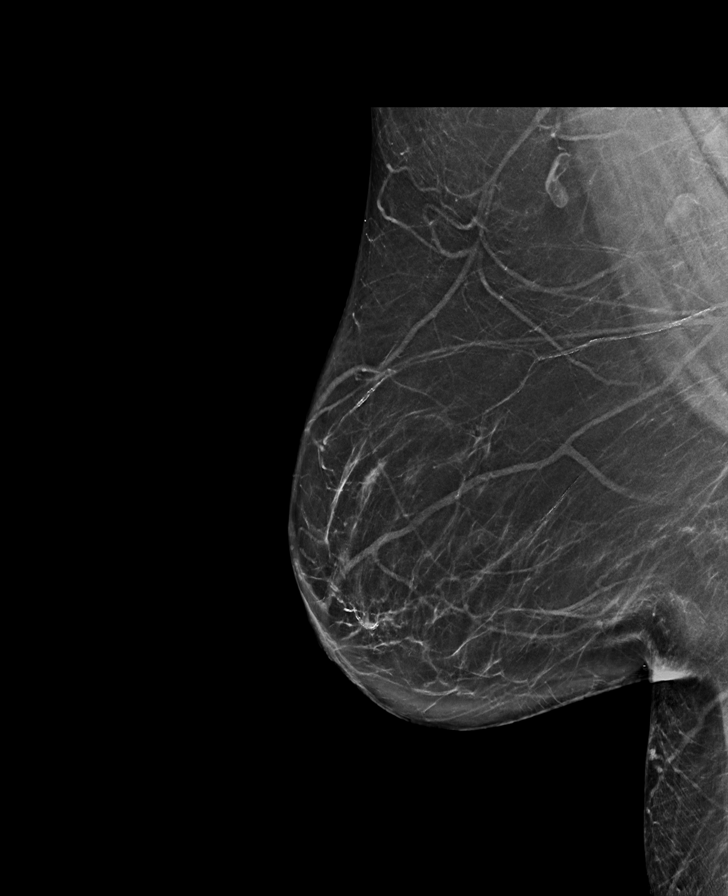

[L CC synth-2D]
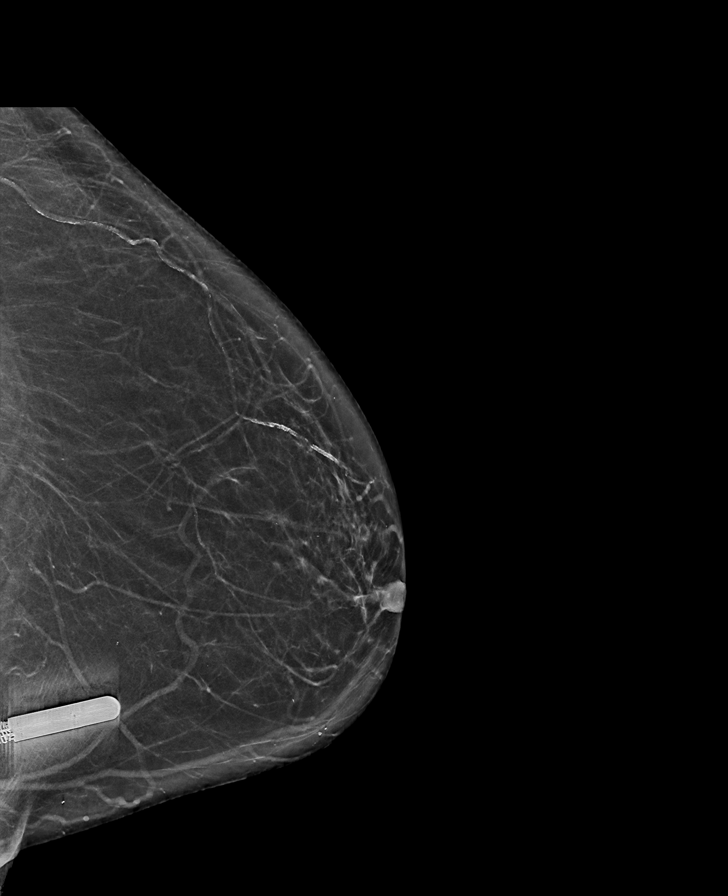

[R CC synth-2D]
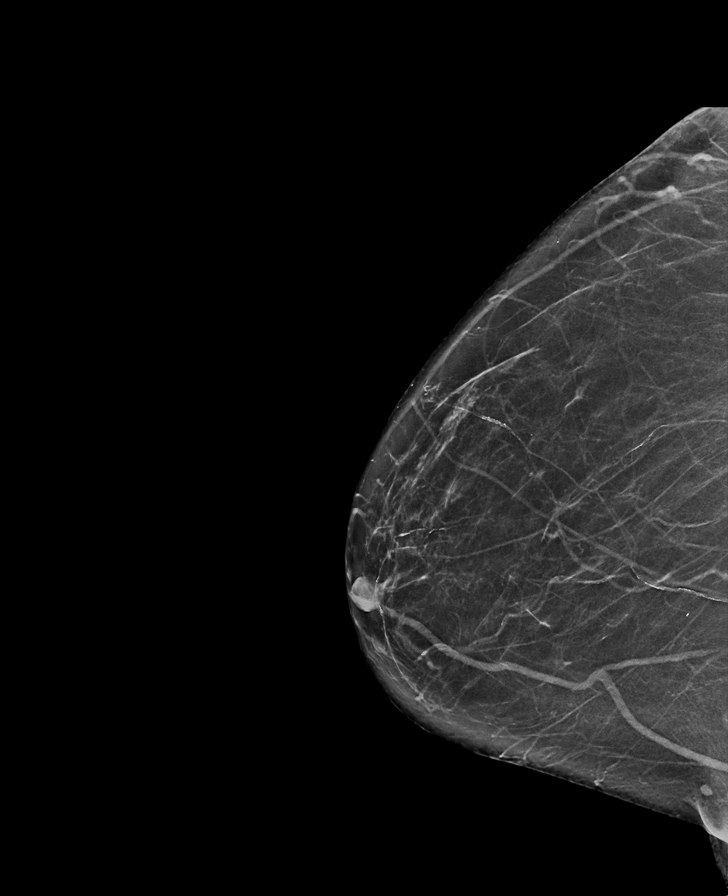

[L MLO synth-2D]
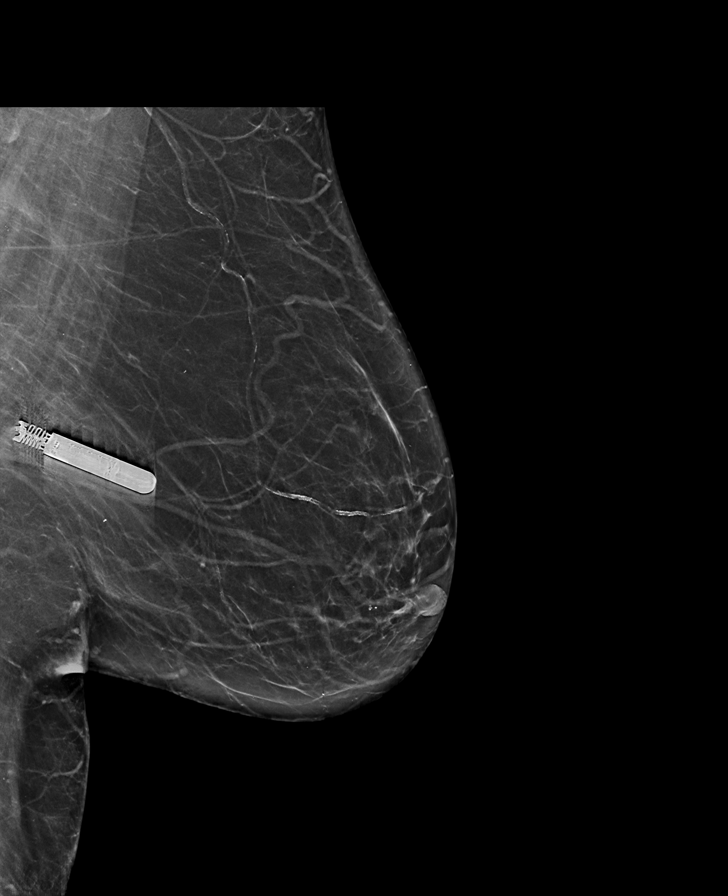

[R CC tomo · tomo slice 30/59.0]
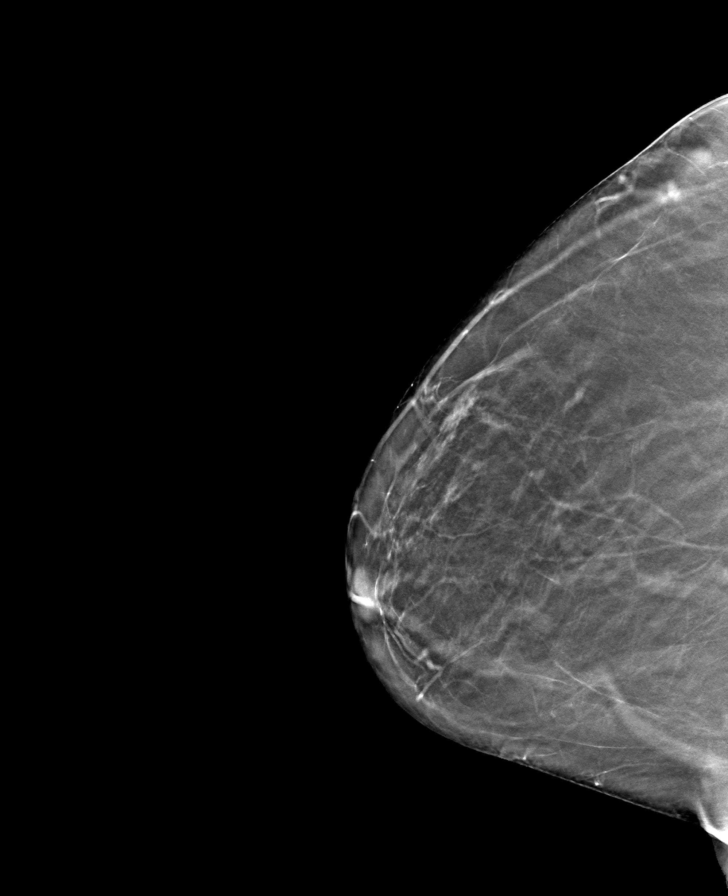

[R MLO tomo · tomo slice 37/74.0]
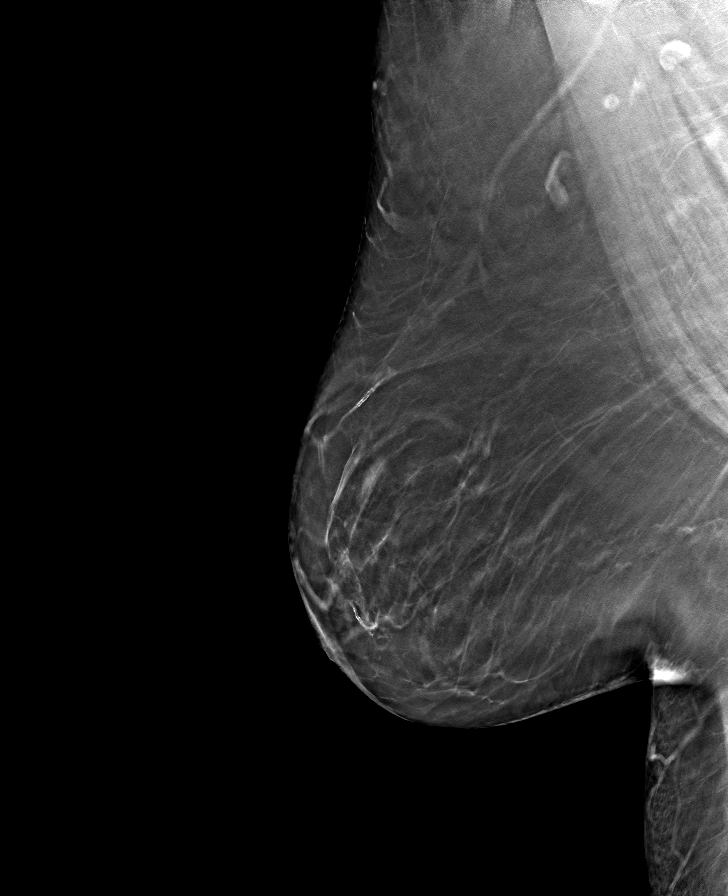

[L MLO tomo · tomo slice 36/71.0]
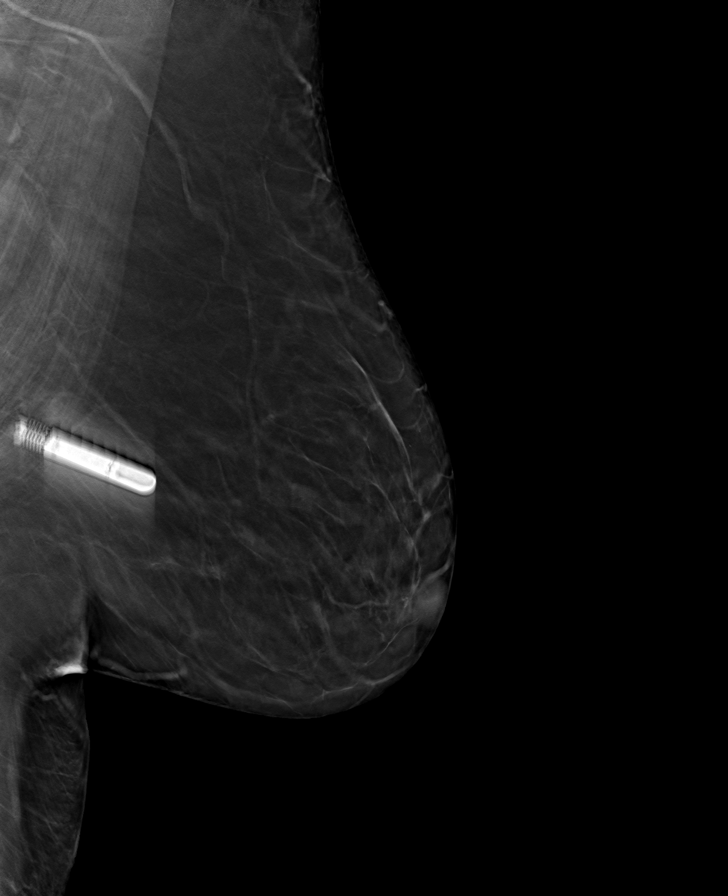

[L CC tomo · tomo slice 31/62.0]
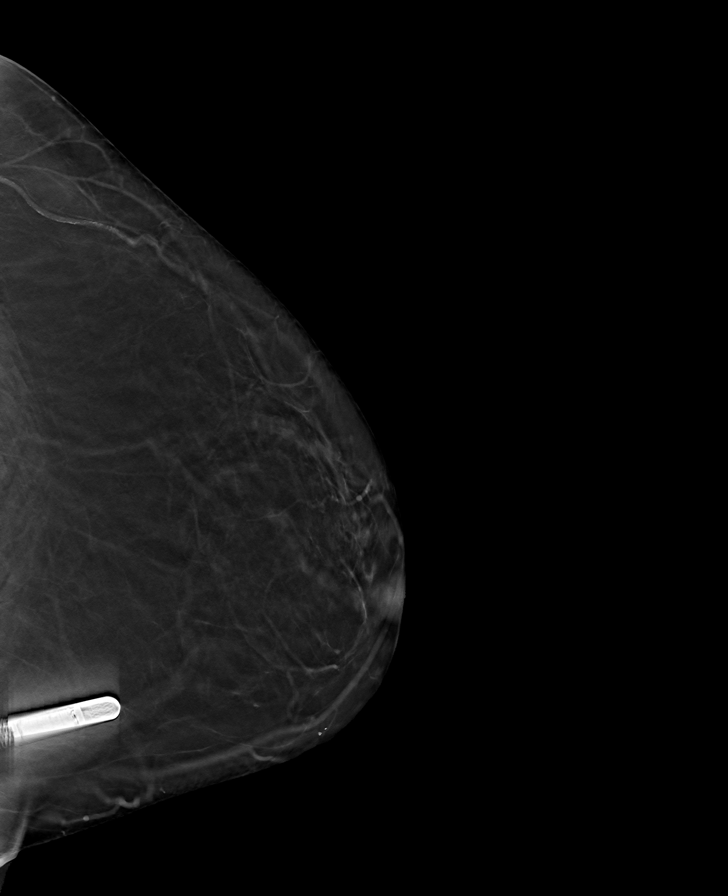

[8 of 24 positions shown; findings below may reference images not displayed]

FINDINGS: There are no findings suspicious for malignancy. Images were
processed with CAD.
IMPRESSION: No mammographic evidence of malignancy. A result letter of this
screening mammogram will be mailed directly to the patient.

RECOMMENDATION:
Screening mammogram in one year. (Code:[TA])

BI-RADS CATEGORY  1: Negative.

## 2020-03-21 ENCOUNTER — Ambulatory Visit: Payer: Medicare Other

## 2020-03-25 ENCOUNTER — Encounter: Payer: Self-pay | Admitting: Neurology

## 2020-03-27 ENCOUNTER — Ambulatory Visit (INDEPENDENT_AMBULATORY_CARE_PROVIDER_SITE_OTHER): Payer: Medicare Other | Admitting: Neurology

## 2020-03-27 ENCOUNTER — Encounter: Payer: Self-pay | Admitting: Neurology

## 2020-03-27 ENCOUNTER — Other Ambulatory Visit: Payer: Self-pay

## 2020-03-27 VITALS — BP 138/72 | HR 67 | Temp 97.4°F | Ht 67.0 in | Wt 238.0 lb

## 2020-03-27 DIAGNOSIS — G4733 Obstructive sleep apnea (adult) (pediatric): Secondary | ICD-10-CM | POA: Diagnosis not present

## 2020-03-27 DIAGNOSIS — D509 Iron deficiency anemia, unspecified: Secondary | ICD-10-CM | POA: Diagnosis not present

## 2020-03-27 DIAGNOSIS — G2581 Restless legs syndrome: Secondary | ICD-10-CM

## 2020-03-27 DIAGNOSIS — G43711 Chronic migraine without aura, intractable, with status migrainosus: Secondary | ICD-10-CM | POA: Diagnosis not present

## 2020-03-27 DIAGNOSIS — Z9884 Bariatric surgery status: Secondary | ICD-10-CM | POA: Diagnosis not present

## 2020-03-27 DIAGNOSIS — Z9989 Dependence on other enabling machines and devices: Secondary | ICD-10-CM | POA: Diagnosis not present

## 2020-03-27 DIAGNOSIS — I2729 Other secondary pulmonary hypertension: Secondary | ICD-10-CM | POA: Diagnosis not present

## 2020-03-27 DIAGNOSIS — I480 Paroxysmal atrial fibrillation: Secondary | ICD-10-CM | POA: Diagnosis not present

## 2020-03-27 DIAGNOSIS — R790 Abnormal level of blood mineral: Secondary | ICD-10-CM

## 2020-03-27 NOTE — Patient Instructions (Signed)
Iron Level and Total Iron-Binding Capacity Tests Why am I having this test? Iron level and total iron-binding capacity (TIBC) tests may be used to:  Diagnose iron-deficiency anemia, a condition in which the concentration of red blood cells or hemoglobin in the blood is below normal because of too little iron.  Monitor treatment for iron-deficiency anemia.  Monitor conditions that cause your body to break down red blood cells more quickly than normal. You may have these tests if:  You are pregnant.  You have had a blood test that showed abnormal red blood cell numbers, size, or color.  Your health care provider suspects iron overload, iron poisoning, or low levels of iron due to blood loss. What is being tested? These tests measure the amount of iron in your blood (serum iron level) and the amount of a type of protein called transferrin. Iron comes from your diet and becomes part of the hemoglobin in your red blood cells. For iron to bind to hemoglobin, it must first be carried from your small intestine into your bone marrow by transferrin. The tests determine the amount of transferrin that is available to transport iron to the bone marrow (iron-binding capacity). What kind of sample is taken?  A blood sample is required for this test. It is usually collected by inserting a needle into a blood vessel. How do I prepare for this test? Do not eat or drink anything except water after midnight on the night before the test or as told by your health care provider. Tell a health care provider about:  All medicines you are taking, including vitamins, herbs, eye drops, creams, and over-the-counter medicines.  Any recent blood transfusions you have had.  Any medical conditions you have.  Whether you have had any recent meals that contained high levels of iron. How are the results reported? Your test results will be reported as values. Your health care provider will compare your results to normal  ranges that were established after testing a large group of people (reference ranges). Reference ranges may vary among labs and hospitals. For this test, common reference ranges are: Iron  Female: 80-180 mcg/dL or 14-32 micromoles/L (SI units).  Female: 60-160 mcg/dL or 11-29 micromoles/L (SI units).  Newborn: 100-250 mcg/dL.  Child: 50-120 mcg/dL. TIBC  250-460 mcg/dL or 45-82 micromoles/L (SI units). Transferrin  Adult female: 215-365 mg/dL or 2.15-3.65 g/L (SI units).  Adult female: 250-380 mg/dL or 2.5-3.8 g/L (SI units).  Newborn: 130-275 mg/dL.  Child: 203-360 mg/dL. Transferrin saturation  Female: 20-50%.  Female: 15-50%. What do the results mean? Serum iron levels that are higher than normal may be related to many health conditions, including:  Genetic disorders that cause an increase in iron levels, such as hemosiderosis and hemochromatosis.  Iron poisoning.  Hemolytic anemia.  Liver diseases such as hepatitis and hepatic necrosis.  Lead poisoning. Serum iron levels that are lower than normal may be related to:  Poor dietary iron intake.  Long-term blood loss.  Insufficient absorption of iron in the small intestine.  Pregnancy.  Iron-deficiency anemia.  Tumors. Abnormally high TIBC or transferrin levels may be related to:  Estrogen therapy.  Pregnancy.  Polycythemia vera.  Iron-deficiency anemia. Abnormally low TIBC or transferrin levels may indicate various conditions, including:  Malnutrition.  Hypoproteinemia.  Conditions that cause swelling (inflammation) throughout the body.  Cirrhosis of the liver.  Some anemias, including hemolytic, pernicious, and sickle cell anemias. Talk with your health care provider about what your results mean. Questions to   ask your health care provider Ask your health care provider, or the department that is doing the test:  When will my results be ready?  How will I get my results?  What are my  treatment options?  What other tests do I need?  What are my next steps? Summary  Iron level and total iron-binding capacity (TIBC) tests may be used to diagnose iron-deficiency anemia, monitor treatment for iron-deficiency anemia, or monitor conditions that cause your body to break down red blood cells more quickly than normal.  These tests measure the amount of iron in your blood (serum iron level) and the amount of a type of protein called transferrin. Transferrin transports iron in your body so that the iron can become part of the hemoglobin in your red blood cells.  For this test, a blood sample is usually collected by inserting a needle into a blood vessel.  Talk with your health care provider about what your results mean. This information is not intended to replace advice given to you by your health care provider. Make sure you discuss any questions you have with your health care provider. Document Revised: 07/21/2018 Document Reviewed: 07/23/2017 Elsevier Patient Education  2020 [employer]  

## 2020-03-27 NOTE — Progress Notes (Addendum)
[facility]   Provider:  Larey Seat, M D  Referring Provider: [name], DO Primary Care Physician:    Chief Complaint  Patient presents with  . Follow-up    pt alone, rm 10. pt states things have been good since last visit. DME [employer] and as of feb 26,2021 she has met the 5 yr mark and is eligble for a new machine. pt has not had labs rechecked since receiving the iron infusion in Dec 2020    HPI:  [name] is a 74 y.o. female  Is seen here ]on 03-27-2020 as a revisit for CPAP dysfunction- snoring and restless legs. She has severe OSA , controlled on CPAP and hypersomnia. She has been diagnosed with dystonia , cervicalgia and ophthalmic migraines.  Pacemaker, atrial fib patient. Dr Daneen Schick.  The iron infusion worked well from December to about March 12th- will check ferritin today.  CPAP new machine is coming up, she lost some weight, has gained exercise tolerance.  It is set between a minimum setting of 7 and a maximum pressure setting of 18 cmH2O pressure is 2 cm expiratory pressure relief.  The residual AHI is 2.6 which is an excellent resolution.  The 95th percentile pressure is 12.8 she does have moderate air leaks.  We will need a baseline in order to justify the new CPAP machine.  Epworth sleepiness score was endorsed at 4 , and the fatigue severity questionnaire  at 13 points. She is now working on a balance improvement program.  Will check for labs.            Consent Form Botulism Toxin Injection For Chronic Migraine  Interval history 01/10/2020: Patient's migraines improved on botox > 70% in severity and frequency but we have been delayed due to covid for many months. +a  Reviewed orally with patient, additionally signature is on file:  Botulism toxin has been approved by the Federal drug administration for treatment of chronic migraine. Botulism toxin does not cure chronic migraine and it may not be effective in some patients.  The  administration of botulism toxin is accomplished by injecting a small amount of toxin into the muscles of the neck and head. Dosage must be titrated for each individual. Any benefits resulting from botulism toxin tend to wear off after 3 months with a repeat injection required if benefit is to be maintained. Injections are usually done every 3-4 months with maximum effect peak achieved by about 2 or 3 weeks. Botulism toxin is expensive and you should be sure of what costs you will incur resulting from the injection.  The side effects of botulism toxin use for chronic migraine may include:   -Transient, and usually mild, facial weakness with facial injections  -Transient, and usually mild, head or neck weakness with head/neck injections  -Reduction or loss of forehead facial animation due to forehead muscle weakness  -Eyelid drooping  -Dry eye  -Pain at the site of injection or bruising at the site of injection  -Double vision  -Potential unknown long term risks  Contraindications: You should not have Botox if you are pregnant, nursing, allergic to albumin, have an infection, skin condition, or muscle weakness at the site of the injection, or have myasthenia gravis, Lambert-Eaton syndrome, or ALS.  It is also possible that as with any injection, there may be an allergic reaction or no effect from the medication. Reduced effectiveness after repeated injections is sometimes seen and rarely infection at the injection  site may occur. All care will be taken to prevent these side effects. If therapy is given over a long time, atrophy and wasting in the muscle injected may occur. Occasionally the patient's become refractory to treatment because they develop antibodies to the toxin. In this event, therapy needs to be modified. I have read the above information and consent to the administration of botulism toxin. BOTOX PROCEDURE NOTE FOR MIGRAINE HEADACHE Contraindications and precautions discussed with  patient(above). Aseptic procedure was observed and patient tolerated procedure. Procedure performed by Dr. Georgia Dom The condition has existed for more than 6 months, and pt does not have a diagnosis of ALS, Myasthenia Gravis or Lambert-Eaton Syndrome.  Risks and benefits of injections discussed and pt agrees to proceed with the procedure.  Written consent obtained These injections are medically necessary. Pt  receives good benefits from these injections. These injections do not cause sedations or hallucinations which the oral therapies may cause.  Description of procedure:  The patient was placed in a sitting position. The standard protocol was used for Botox as follows, with 5 units of Botox injected at each site: -Procerus muscle, midline injection -Corrugator muscle, bilateral injection -Frontalis muscle, bilateral injection, with 2 sites each side, medial injection was performed in the upper one third of the frontalis muscle, in the region vertical from the medial inferior edge of the superior orbital rim. The lateral injection was again in the upper one third of the forehead vertically above the lateral limbus of the cornea, 1.5 cm lateral to the medial injection site.  -Temporalis muscle injection, 5 sites, bilaterally. The first injection was 3 cm above the tragus of the ear, second injection site was 1.5 cm to 3 cm up from the first injection site in line with the tragus of the ear. The third injection site was 1.5-3 cm forward between the first 2 injection sites. The fourth injection site was 1.5 cm posterior to the second injection site. 5th site laterally in the temporalis  muscleat the level of the outer canthus. - Patient feels her clenching is a trigger for headaches. +5 units masseter bilaterally  - Patient feels the migraines are centered around the eyes +5 units bilaterally at the outer canthus in the orbicularis occuli -Occipitalis muscle injection, 3 sites, bilaterally. The first  injection was done one half way between the occipital protuberance and the tip of the mastoid process behind the ear. The second injection site was done lateral and superior to the first, 1 fingerbreadth from the first injection. The third injection site was 1 fingerbreadth superiorly and medially from the first injection site. -Cervical paraspinal muscle injection, 2 sites, bilateral knee first injection site was 1 cm from the midline of the cervical spine, 3 cm inferior to the lower border of the occipital protuberance. The second injection site was 1.5 cm superiorly and laterally to the first injection site. Trapezius muscle injection was performed at 3 sites, bilaterally. The first injection site was in the upper trapezius muscle halfway between the inflection point of the neck, and the acromion. The second injection site was one half way between the acromion and the first injection site. The third injection was done between the first injection site and the inflection point of the neck. Will return for repeat injection in 3 months. A 200 unit sof Botox was used, any Botox not injected was wasted. The patient tolerated the procedure well, there were no complications of the above procedure.    [name] has a history  of significant 0P -5 ER visit iron deficiency anemia and has gotten relief for her restless leg syndrome only after IV transfusion of iron products.  Aside from restless legs she is also a CPAP user for the presence of obstructive sleep apnea and she is due for a new machine.  She has kept her BMI under 40 and she looks healthy and in good shape today.  She is status post gastric bypass surgery.  As to the new CPAP machine I can order a machine I could also repeat her current sleep test in form of a home sleep test to give [place] a new baseline but before I ordered a home sleep test I want her treated for restless legs as I think that the results of a home sleep test would be significantly  altered while she has restless legs.  It is visible as she is constantly rubbing her legs here today sitting across from me.    I have followed [name] from 2003 through 2008  And see her now for CPAP compliance.  The patient had undergone gastric bypass surgery, which led to an extensive loss of body weight. In the process she was no longer suffering from obstructive sleep apnea related to the weight loss, but she developed restless legs. Repeated iron studies had shown that she had extremely low ferritin levels. A ferritin level lower than 50 is associated with a much higher incidence of restless leg syndrome in addition her iron and iron binding capacities have been surprisingly normal and this conflict of data we have discussed today. Usually I would treat the patient with Ferrlecit and infusion once every 3 months or so to keep up ferritin levels in in her case I am not sure how much free iron is actually in her system. 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 In addition she is vitamin D deficient which are almost 80% of adult Americans. Gained some weight back but she has no longer nausea or dumping syndrome so therefore she does not need Phenergan or Zofran. She is using gabapentin and ropinirole to treat her restless legs she often wakes up with a headache and she suffers from at times severe crippling migraines. Leg cramps also interfere with her sleep the initiation of sleep as well as the sleep duration at night.  As her BMI exceeds 35, she may also have developed apnea again and snoring has been witnessed by her family. We are meeting today to reevaluate all these conditions in context. She is also excessively daytime sleepy for fatigue severity score is endorsed at 63 points her Epworth sleepiness score was endorsed at 15 points. Bedtime is usually 10 Pm and her husband leaves for work at 6.30 AM , after that she will sleep for 2-3 hours. Total daily sleep  time is 14 hours, this is more likely related to depression.   [name] is [birth year]. #1 She is doing moderately well with her CPAP use was a compliance of 73% average user time on days of use is 4 hours and 44 minutes. Minimum pressure for this AutoSet 7 maximum 15 cm water. The patient's AHI was 2.5 which is a desirable result. The 95th percentile pressure is 12.3. The patient could not use the machine for about 2 weeks after she contracted an upper respiratory infection doing a vacation stay in [place]. She is now back to using it regularly.  #2 her restless leg question here of was endorsed at moderate  impairment quality of life.  It seems that her iron levels have rebounded to some degree , but Ferritin results are pending. Her geriatric depression score does not indicate depression her Epworth sleepiness score was endorsed at 8 points and her fatigue severity score 26 points.  #3 headache are a concern. She hates the pain clinic. We are not providing pain management in this clinic but I do feel that the patient could change with the same narcotic agreement and contract to [name]  Her main care provider. She has no history of overusing up using or accidentally overdosing any medications. Her depression scores are in normal range not indicated clinical depression at all. The patient is aware that she could receive with a headache attack Depakote IV. However I am not thrilled with the opportunity of preventing migrainous headaches by Depakote, given the risk of weight gain, liver failure tremor. She has been on a beta blocker which has helped to some degree. She would be interested in some kind of injection therapy but I explained that Botox injections in the Medicare population on very difficult to get paid for. And Botox is an extremely costly medication these days. The patient has some basic medical training and she would be willing to give herself a shot. We discussed TORADOL  which can be taken either  as a 10 mg 2 pill or as an IM medication.  #4 patient will receive 1 mL equaling 2,000,000 g of Toradol intramuscular injection today for her acute headache. If this works well for her it could be an alternative treatment and hopefully will relieve her of the need to take narcotics to. The patient is a status post gastric bypass surgery so she cannot have nonsteroidal by mouth .  Interval history from 04/09/2016. [name] is seen here today after she has twice been seen by my colleague Dr. [name]. I had sent her to her to evaluate her headaches and possible dystonia and neuralgic character and she was referred for Dr. Kittie Plater for possible Botox injections. Within the workup for headaches and MRI of the brain was obtained. This showed 2 small wedge shaped foci in the left hemisphere consistent with prior small ischemic strokes. She was started on aspirin baby size and Zetia. There was chronic microvascular ischemic change as expected for age, some cortical atrophy no acute findings. At the time that she saw Dr. [name] she had neck pain for 7-8 months already. Voltaren gel helps a little bit pain on movement. She has migraines without aura that started already when she was a teenager. Tried topiramate in the past has failed amitriptyline, try Toradol, tried Imitrex , Cymbalta.  She has weaned off all pain medications.  CT of the soft tissue of the neck was also reviewed ; there is evidence of cervical disc and facet degeneration. An echocardiogram was ordered revealing a patent foramen ovale or she is scheduled for an occipital nerve block with Dr. Dr. Kittie Plater, he will do a second one next coming Friday. This has helped her neck pain. She has not seen her cardiologist.  [name] presents today on 10/23/2016. She has recently seen Dr. [name] again who has been successfully treating her head and neck pain. 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 The pain has improved. As  to her CPAP use is 87% compliant with an average user time of 6 hours and 9 minutes, she is using an AutoSet between 7 and 15 cm water. Her 95th  percentile pressure is 14.4 and straddles close to the maximum pressure allowed. A residual AHI is 7.2 and the residual apneas are obstructive in nature. I do think we should increase the pressure window by 2 cm water. She endorsed today the Epworth sleepiness score at only 4 points.  Her RLS is controlled on 2 nightly doses of medication, each dose giving relief for about 4 hours. She takes quinine for leg cramps. She has  No arryhthmia, has a loop recorder .  Recent mamogram was normal but her cardiologist discovered a mass under the 4 th rib. She is awaiting a CT chest now.    02-02-2018 [facility] CLIN[facility]-[name] is seen here today in a regular revisit, but I have seen her last almost 14 months ago.  Her last visit at [facility] was with Dr. [name] whose note I quote below.  The patient has remained a compliant CPAP user for 25 out of the last 30 days, 5 hours 52 minutes on average use, AutoSet between 5 and 16 cm water pressure with 3 cm EPR residual AHI is 5.7 the vast majority seems to be obstructive in nature yet the 95th percentile pressure is only 14 cmH2O.  We will either have to raise the maximum pressure or reduce the expiratory pressure relief.  She does have high air leaks at times.  She feels extremely fatigued again- but endorsed only 36 points on the fatigue score and 5 points on the Epworth sleepiness score. She wonders if her iron is low and [name] has just yesterday obtained no labs of which are not previous.  She has had another IV iron infusion in 2018 and one 2 weeks ago.  In addition she reports that Dr. Tamala Julian, her cardiologist, would like her control of cholesterol to be much better and recommends the injectable epatha,  which cost her $500 a month and co-pay.  However it is probably the most effective treatment she can get.  All oral  Statin  medications have caused myositis and myalgia.  DR Ahern's note. 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 She has significant pain, decreased ROM which is chronic for over one year. She gets regular massage. We have performed multiple trigger point injections with Depo-Medrol, lidocaine and Marcaine. She has tried baclofen, Cymbalta, oxycodone, Lyrica, gabapentin, Robaxin without any relief. Patient would benefit from Physical therapy for stretching, strengthening, manual therapy and massage as well as dry needling of the right trapezius muscle and any other muscles o modalities as clinically warranted by physical therapy evaluation  Procedure note for Spasmodic torticollis:EMG: EMG guidance was used to inject muscles detailed below. Aseptic procedure was performed and patient tolerated procedure. Procedure was performed by Dr. Myrla Halsted. Patient tolerated the procedure. Refractory to oral medications . Motrin / tylenol for injections site pain / soreness . REMS precautions handout given to patient  RTC - see instructions for details Wasted 200 Units. Dysport-500 units [id number] x 2 vials. Dysport-500unitsx2vial     Social history:  Retired, married , adult children. Daughter and mother were excessively daytime sleepy. Mother and daughter have RLS.     CPAP : machine issued on 2-08-17-15.  12-9 2020 CPAP compliance has been excellent at 77% the average use at time on days used is 5 hours 26 minutes.  The patient had uses an auto titration between 7 and 16 cmH2O with 3 cm EPR and has a residual AHI of 3.2.  95th percentile pressure is 14 cmH2O all residual  apneas seem to be obstructive in nature.  I do think we could increase the maximum pressure by another centimeter or relief EPR to 2 cm to allow her getting enough airway pressure to overcome the obstructive events.  There were 3 minutes or 2% of the nights Cheyne-Stokes respirations noted.  This is not concerning.  She has a very  good fit with her interface and very little air leak.   IMPRESSION:  2017 This MRI of the brain with and without contrast shows the following: 1.   Two small wedge-shaped foci in the left hemisphere consistent with prior small ischemic strokes.  As both involve the gray matter and juxtacortical white matter, consider an embolic etiology 2.   Chronic microvascular ischemic change elsewhere, typical extent for age. ( can be  Gastric bypass related )  3.    Cortical atrophy, more than expected for age. 4.   There is a normal enhancement pattern and there are no acute findings.   INTERPRETING PHYSICIAN:  Richard A. Felecia Shelling, MD, PhD Certified in  [place] by [place] of Neuroimaging   Review of Systems: Out of a complete 14 system review, the patient complains of only the following symptoms, and all other reviewed systems are negative.  Restless legs she often wakes up with a headache and she suffers from at times severe crippling migraines. She has dizziness, optic migraine, scotoma in visiual field.  Leg cramps also interfere with her sleep the initiation of sleep as well as the sleep duration at night.  Epworth Sleepiness score 3.   How likely are you to doze in the following situations: 0 = not likely, 1 = slight chance, 2 = moderate chance, 3 = high chance  Sitting and Reading? Watching Television? Sitting inactive in a public place (theater or meeting)? Lying down in the afternoon when circumstances permit? Sitting and talking to someone? Sitting quietly after lunch without alcohol? In a car, while stopped for a few minutes in traffic? As a passenger in a car for an hour without a break?  Total = 3 on 11-23-2019   Fatigue severity score 9 from 24/ 63 - remarkable.  ,  depression score 3.   She lost a lot of weight, she is exercising and she weaned aff all narcotics in 2017.  RLS recurrent. .     Social History   Socioeconomic History  . Marital status:  Married    Spouse name: Pilar Jarvis  . Number of children: 3  . Years of education: college  . Highest education level: Not on file  Occupational History  . Occupation: Retired  Tobacco Use  . Smoking status: Never Smoker  . Smokeless tobacco: Never Used  Substance and Sexual Activity  . Alcohol use: No    Alcohol/week: 0.0 standard drinks  . Drug use: No  . Sexual activity: Yes    Birth control/protection: Post-menopausal  Other Topics Concern  . Not on file  Social History Narrative   Lives at home w/ her husband   Caffeine 8-10 cups daily.  (coffee 1 cup am, unsw tea all day long).    Social Determinants of Health   Financial Resource Strain:   . Difficulty of Paying Living Expenses:   Food Insecurity:   . Worried About Charity fundraiser in the Last Year:   . Arboriculturist in the Last Year:   Transportation Needs:   . Film/video editor (Medical):   Marland Kitchen Lack of Transportation (Non-Medical):  Physical Activity:   . Days of Exercise per Week:   . Minutes of Exercise per Session:   Stress:   . Feeling of Stress :   Social Connections:   . Frequency of Communication with Friends and Family:   . Frequency of Social Gatherings with Friends and Family:   . Attends Religious Services:   . Active Member of Clubs or Organizations:   . Attends Archivist Meetings:   Marland Kitchen Marital Status:   Intimate Partner Violence:   . Fear of Current or Ex-Partner:   . Emotionally Abused:   Marland Kitchen Physically Abused:   . Sexually Abused:     Family History  Problem Relation Age of Onset  . CAD Mother   . Hypertension Mother   . Neuropathy Mother   . Osteoarthritis Mother   . Heart disease Mother   . Breast cancer Maternal Grandmother   . CAD Paternal Grandfather   . Colon cancer Father     Past Medical History:  Diagnosis Date  . 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 Arthritis    generalized  . Asthma   . Atrophic vaginitis   . Back pain     DDD/stenosis  . Carotid stenosis    Carotid US 10/16: Plaque RICA (3-32%), normal LICA  . Depression    takes Cymbalta daily  . Diverticulosis    benign  . DJD (degenerative joint disease)   . Dyslipidemia   . Dysrhythmia   . Family history of GI bleeding   . GERD (gastroesophageal reflux disease)    takes Nexium daily  . Gestational diabetes   . H/O hiatal hernia    surgery for hernia  . Headache(784.0)    takes Imitrex daily as needed and Bisoprolol daily;last migraine was about 2wks ago  . History of bronchitis 1 yr ago  . History of shingles   . Insomnia    takes Ambien nightly  . Joint pain   . Joint swelling   . Leg cramps    takes Flexeril daily as needed  . 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 Nocturia   . Osteoporosis   . Peripheral neuropathy    takes Gabapentin daily  . Pneumonia 74yr ago   hx of  . Restless leg syndrome    takes Requip daily  . RLS (restless legs syndrome) 08/16/2015  . Sleep apnea    uses CPAP  . Tubular adenoma of colon   . Vertigo   . Vitamin D deficiency     Current Outpatient Medications  Medication Sig Dispense Refill  . allopurinol (ZYLOPRIM) 100 MG tablet TK 1 T PO QD FOR GOUT PREVENTION OR FLARE    . ASPIRIN 81 PO Take 81 mg by mouth at bedtime.    . baclofen (LIORESAL) 10 MG tablet TAKE 1 TABLET(10 MG) BY MOUTH TWICE DAILY 60 tablet 0  . bisoprolol (ZEBETA) 10 MG tablet Take 10 mg by mouth 2 (two) times daily.    . calcium carbonate (OS-CAL) 1250 (500 CA) MG chewable tablet Chew 1 tablet by mouth daily. Not sure of dosage    . diclofenac sodium (VOLTAREN) 1 % GEL Apply 4 g topically 4 (four) times daily. 100 g 11  . DULoxetine (CYMBALTA) 60 MG capsule Take 60 mg by mouth daily after supper.  11  . Erenumab-aooe (AIMOVIG) 140 MG/ML SOAJ Inject 140 mg into the skin every 30 (thirty) days. 1 pen 11  . esomeprazole (NEXIUM) 40  MG capsule Take 40 mg by mouth 2 (two) times daily before a meal.   6  . Evolocumab with Infusor ([place]) 420 MG/3.5ML SOCT Inject 1 Cartridge into the skin every 30 (thirty) days. 3.6 mL   . furosemide (LASIX) 20 MG tablet Take 40 mg by mouth daily as needed for fluid or edema.    . gabapentin (NEURONTIN) 800 MG tablet Take 1 tablet (800 mg total) by mouth 2 (two) times daily. 180 tablet 3  . HYDROcodone-acetaminophen (NORCO) 10-325 MG tablet Take 1 tablet by mouth every 8 (eight) hours as needed for moderate pain or severe pain. Takes for migraines    . Loratadine (CLARITIN) 10 MG CAPS Take 1 capsule by mouth as needed (for allergies).     . ondansetron (ZOFRAN-ODT) 4 MG disintegrating tablet DISSOLVE 1 TABLET(4 MG) ON THE TONGUE EVERY 8 HOURS AS NEEDED FOR NAUSEA OR VOMITING 45 tablet 11  . PROAIR HFA 108 (90 Base) MCG/ACT inhaler 2 INHALATIONS EVERY 4 HOURS AS NEEDED FOR ASTHMA SYMPTOMS (WHEEZING/SHORTNESS OF BREATH)  5  . quiNINE (QUALAQUIN) 324 MG capsule Take 1 capsule (324 mg total) by mouth 2 (two) times daily. 180 capsule 3  . rOPINIRole (REQUIP) 1 MG tablet TAKE 1 TABLET(1 MG) BY MOUTH TWICE DAILY BEFORE LUNCH AND SUPPER 180 tablet 3  . topiramate (TOPAMAX) 100 MG tablet Take 1 tablet (100 mg total) by mouth at bedtime. 30 tablet 11  . Vitamin D, Ergocalciferol, (DRISDOL) 50000 units CAPS capsule Take 1 capsule (50,000 Units total) by mouth every 7 (seven) days. 30 capsule 3  . zolpidem (AMBIEN) 10 MG tablet     . amphetamine-dextroamphetamine (ADDERALL) 10 MG tablet Take 1 tablet (10 mg total) by mouth daily before breakfast. 30 tablet 0  . SUMAtriptan (TOSYMRA) 10 MG/ACT SOLN Place 1 spray into the nose once for 1 dose. (Patient not taking: Reported on 01/10/2020) 1 each 0   No current facility-administered medications for this visit.    Allergies as of 03/27/2020  . (No Known Allergies)    Vitals: BP 138/72   Pulse 67   Temp (!) 97.4 F (36.3 C)   Ht 5' 7"  (1.702 m)   Wt 238 lb (108 kg)   BMI 37.28 kg/m  Last Weight:  Wt Readings from Last 1 Encounters:    03/27/20 238 lb (108 kg)       Last Height:   Ht Readings from Last 1 Encounters:  03/27/20 5' 7"  (1.702 m)    Physical exam:  General: The patient is awake, alert and appears not in acute distress. The patient is well groomed. Head: Normocephalic, atraumatic. Neck is now supple. No tenderness. Mallampati 3 ,  neck circumference: 15 inches. Nasal airflow unrestricted, TMJ is not evident. Retrognathia is seen.  Cardiovascular:  Regular rate and rhythm, without  murmurs or carotid bruit, and without distended neck veins. Respiratory: Lungs are clear to auscultation. Skin:  Without evidence of edema, or rash, well healed knee replacement scars.  Trunk: BMI is elevated .  Neurologic exam :The patient is awake and alert, oriented to place and time.  Memory subjective described as intact.  There is a normal attention span & concentration ability. Speech is fluent without  dysarthria, dysphonia or aphasia. Mood and affect are appropriate. Cranial nerves: Pupils are equal and briskly reactive to light.  Extraocular movements  in vertical and horizontal planes intact ,without nystagmus.  Visual fields by finger perimetry are intact. Hearing to finger rub intact.  Facial motor strength is symmetric and tongue and uvula move midline.  Shoulder shrug intact . Motor exam:  Normal tone, no cog-wheeling, muscle bulk and symmetric strength in all extremities. No cog wheeling and no waxy or clasp knife response.  Sensory:  Fine touch, pinprick and vibration were tested in all extremities. Proprioception in both feet is affeced by pin and needle dysesthesias.  Coordination: Rapid alternating movements in the fingers/hands is normal. Finger-to-nose maneuver normal without evidence of ataxia, dysmetria or tremor. Gait and station: Patient walks without assistive device .  Strength within normal limits. Stance is stable and normal.  Deep tendon reflexes: in the  upper and lower extremities are symmetric.  Babinski maneuver deferred.   Assessment:  After physical and neurologic examination, review of laboratory studies, imaging, neurophysiology testing and pre-existing records, assessment is   The patient was advised of the nature of the diagnosed sleep disorder , the treatment options and risks for general a health and wellness arising from not treating the condition. Visit duration was 40 minutes.  Of today's new patient visit-consultation 50% of our face-to-face time are spent in discussion of the medical conditions found the differential diagnosis the testing and treatment options for the conditions. The patient is willing to undergo a new iron infusion cycle .  Her cardiologists note's support this Pernell Dupre ,MD ) , Dr. Waymon Budge.   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  She is chronically iron deficient, she has been vitamin D deficient she has been vitamin B12 deficient. I have prescribed her a vitamin B12 nasal spray, vitamin D 50,000 units weekly pill, and I will obtain iron studies today to see which dose of an iron infusion would be indicated for her.  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 I would like for her to meet with a cardiologist to discuss the relevance to her MRI findings as well as to her clinical symptoms of headaches. The pain in the neck and headaches have both improved and I hope that she can continue Botox therapy through Dr. Elnita Maxwell as well as trigger point injections. Nerve blocks for neuralgia are also ordered. A download of her machine was not yet obtained but I reviewed her restless leg symptomatic and her RLS rating scale was endorsed at very low impediment there is no evidence at this time of major quality of life impairment based on restless leg intensity. She is not depressed. Is in the process of losing  weight and she appears optimistic and motivated. I'm especially proud that she weaned herself off all narcotics.  Plan:  Treatment plan and additional workup :  1 RLS with iron deficiency ) Dr. Beryle Beams  evaluated her discrepancy between very low ferritin levels  and apparently sufficient iron storage. She has maintained oral iron intake as well as multivitamins and multi minerals. Repeat ferritin levels, order iron IV. 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     2)  [name]'s sensory neuropathy. She is not diabetic she has no documented thyroid disease and neither did her mother, nor has her daughter -but all of these women half severe painful dysesthesias, leg cramps, and restless legs.  She took quinine with good success, but had to order from [place].  The cost for the medications have exceeded what  she can afford.   3) OSA- continue on CPAP, auto PAP . Doing well !  She has used the machine 87% compliant with an average of 5 hours and 35 minutes on days used.  There was an air sense 10 AutoSet that celebrated fifth birthday in February 2021.  It is set between a minimum setting of 7 and a maximum pressure setting of 18 cmH2O pressure is 2 cm expiratory pressure relief.  The residual AHI is 2.6 which is an excellent resolution.  The 95th percentile pressure is 12.8 she does have moderate air leaks.  We will need a baseline in order to justify the new CPAP machine.  Epworth sleepiness score was endorsed at 4 , and the fatigue severity questionnaire  at 13 points.   New machine was due in 01-2020. HST was ordered for January. I re -ordered today.   4) HA- 2 strokes , not so surprising in the setting of hyperlipidemia which has responded well to Repatha, but her new Medicare provider at now, is not covering the cost and she would pay $500 every 40 days for this medication.  She will set up a meeting with Dr. Daneen Schick, her  cardiologist, to discuss alternatives.  [place] finding during headache work up, March 2017. New dizziness spells, ataxia, constant headaches - will obtain new MRI brain, MRA and she will continue on AIMOVIG. She has responded very well to Dr. Ferdinand Lango botox application.   Rv in 3 month. With me.   I thank Dr [name] for her headache expertise and treatment.  The patient has been relieved of neck pain and occipital neuralgic pain and pain with radiculopathic distribution to the shoulders.    Asencion Partridge Avelina Mcclurkin MD  03/27/2020

## 2020-03-28 ENCOUNTER — Encounter: Payer: Self-pay | Admitting: Neurology

## 2020-03-28 LAB — IRON AND TIBC
Iron Saturation: 13 % — ABNORMAL LOW (ref 15–55)
Iron: 45 ug/dL (ref 27–139)
Total Iron Binding Capacity: 343 ug/dL (ref 250–450)
UIBC: 298 ug/dL (ref 118–369)

## 2020-03-28 LAB — FERRITIN: Ferritin: 28 ng/mL (ref 15–150)

## 2020-03-28 NOTE — Progress Notes (Signed)
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

## 2020-03-29 ENCOUNTER — Telehealth: Payer: Self-pay | Admitting: Neurology

## 2020-03-29 ENCOUNTER — Telehealth: Payer: Self-pay

## 2020-03-29 DIAGNOSIS — G4733 Obstructive sleep apnea (adult) (pediatric): Secondary | ICD-10-CM

## 2020-03-29 NOTE — Telephone Encounter (Signed)
Unfortunately, Medicare will not cover the HST since patient is wanting a new machine. Medicare guidelines state that if a patient is wanting a new machine, patient has to have an inlab sleep study. I have cancelled out the HST order. Can I get an order for in lab sleep study? Thanks!!

## 2020-03-29 NOTE — Addendum Note (Signed)
Addended by: Darleen Crocker on: 03/29/2020 09:47 AM   Modules accepted: Orders

## 2020-03-29 NOTE — Telephone Encounter (Signed)
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

## 2020-03-29 NOTE — Telephone Encounter (Signed)
Called the patient to advise that the ferritin levels remained low and that iron infusion was ordered. There was no answer and a VM was left to advise the patient of this information and that our infusion suite will be calling to get her scheduled for a iron infusion. We will recheck the labs 2 wks after infusion and infuse a 2nd time if still low.

## 2020-04-04 ENCOUNTER — Encounter: Payer: Self-pay | Admitting: Podiatry

## 2020-04-04 ENCOUNTER — Ambulatory Visit (INDEPENDENT_AMBULATORY_CARE_PROVIDER_SITE_OTHER): Payer: Medicare Other | Admitting: Podiatry

## 2020-04-04 ENCOUNTER — Other Ambulatory Visit: Payer: Self-pay

## 2020-04-04 VITALS — Temp 97.3°F

## 2020-04-04 DIAGNOSIS — M779 Enthesopathy, unspecified: Secondary | ICD-10-CM

## 2020-04-04 DIAGNOSIS — M778 Other enthesopathies, not elsewhere classified: Secondary | ICD-10-CM

## 2020-04-04 NOTE — Progress Notes (Signed)
Subjective:   Patient ID: [name], female   DOB: 74 y.o.   MRN: [id number]   HPI Patient presents to the dorsum of both feet and the ankles of both feet stating they have been very sore and making it hard for her to walk and she is done overall pretty well but she needs medication as it is been 9 months   ROS      Objective:  Physical Exam  Neurovascular status intact with inflammation pain sinus tarsi bilateral with fluid buildup and pain in the extensor tendon complex dorsal bilateral     Assessment:  Sinus tarsitis bilateral extensor tendinitis bilateral     Plan:  Sterile prep and today I went ahead and injected the sinus tarsi bilateral 3 mg Kenalog 5 mg Xylocaine and the extensor tendon complex 3 mg dexamethasone Kenalog 5 mg Xylocaine

## 2020-04-05 ENCOUNTER — Telehealth: Payer: Self-pay

## 2020-04-05 NOTE — Telephone Encounter (Signed)
LVM twice to schedule.  4/19 % 4/22.

## 2020-04-09 NOTE — Progress Notes (Signed)
Consent Form Botulism Toxin Injection For Chronic Migraine  Interval history 04/10/2020: Patient's migraines improved on botox > 70% in severity and frequency. +a  Reviewed orally with patient, additionally signature is on file:  Botulism toxin has been approved by the Federal drug administration for treatment of chronic migraine. Botulism toxin does not cure chronic migraine and it may not be effective in some patients.  The administration of botulism toxin is accomplished by injecting a small amount of toxin into the muscles of the neck and head. Dosage must be titrated for each individual. Any benefits resulting from botulism toxin tend to wear off after 3 months with a repeat injection required if benefit is to be maintained. Injections are usually done every 3-4 months with maximum effect peak achieved by about 2 or 3 weeks. Botulism toxin is expensive and you should be sure of what costs you will incur resulting from the injection.  The side effects of botulism toxin use for chronic migraine may include:   -Transient, and usually mild, facial weakness with facial injections  -Transient, and usually mild, head or neck weakness with head/neck injections  -Reduction or loss of forehead facial animation due to forehead muscle weakness  -Eyelid drooping  -Dry eye  -Pain at the site of injection or bruising at the site of injection  -Double vision  -Potential unknown long term risks  Contraindications: You should not have Botox if you are pregnant, nursing, allergic to albumin, have an infection, skin condition, or muscle weakness at the site of the injection, or have myasthenia gravis, Lambert-Eaton syndrome, or ALS.  It is also possible that as with any injection, there may be an allergic reaction or no effect from the medication. Reduced effectiveness after repeated injections is sometimes seen and rarely infection at the injection site may occur. All care will be taken to prevent these  side effects. If therapy is given over a long time, atrophy and wasting in the muscle injected may occur. Occasionally the patient's become refractory to treatment because they develop antibodies to the toxin. In this event, therapy needs to be modified.  I have read the above information and consent to the administration of botulism toxin.    BOTOX PROCEDURE NOTE FOR MIGRAINE HEADACHE    Contraindications and precautions discussed with patient(above). Aseptic procedure was observed and patient tolerated procedure. Procedure performed by Dr. Georgia Dom  The condition has existed for more than 6 months, and pt does not have a diagnosis of ALS, Myasthenia Gravis or Lambert-Eaton Syndrome.  Risks and benefits of injections discussed and pt agrees to proceed with the procedure.  Written consent obtained  These injections are medically necessary. Pt  receives good benefits from these injections. These injections do not cause sedations or hallucinations which the oral therapies may cause.  Description of procedure:  The patient was placed in a sitting position. The standard protocol was used for Botox as follows, with 5 units of Botox injected at each site:   -Procerus muscle, midline injection  -Corrugator muscle, bilateral injection  -Frontalis muscle, bilateral injection, with 2 sites each side, medial injection was performed in the upper one third of the frontalis muscle, in the region vertical from the medial inferior edge of the superior orbital rim. The lateral injection was again in the upper one third of the forehead vertically above the lateral limbus of the cornea, 1.5 cm lateral to the medial injection site.  -Temporalis muscle injection, 5 sites, bilaterally. The first injection was 3  cm above the tragus of the ear, second injection site was 1.5 cm to 3 cm up from the first injection site in line with the tragus of the ear. The third injection site was 1.5-3 cm forward between the  first 2 injection sites. The fourth injection site was 1.5 cm posterior to the second injection site. 5th site laterally in the temporalis  muscleat the level of the outer canthus.  - Patient feels her clenching is a trigger for headaches. +5 units masseter bilaterally   - Patient feels the migraines are centered around the eyes +5 units bilaterally at the outer canthus in the orbicularis occuli  -Occipitalis muscle injection, 3 sites, bilaterally. The first injection was done one half way between the occipital protuberance and the tip of the mastoid process behind the ear. The second injection site was done lateral and superior to the first, 1 fingerbreadth from the first injection. The third injection site was 1 fingerbreadth superiorly and medially from the first injection site.  -Cervical paraspinal muscle injection, 2 sites, bilateral knee first injection site was 1 cm from the midline of the cervical spine, 3 cm inferior to the lower border of the occipital protuberance. The second injection site was 1.5 cm superiorly and laterally to the first injection site.  -Trapezius muscle injection was performed at 3 sites, bilaterally. The first injection site was in the upper trapezius muscle halfway between the inflection point of the neck, and the acromion. The second injection site was one half way between the acromion and the first injection site. The third injection was done between the first injection site and the inflection point of the neck.   Will return for repeat injection in 3 months.   A 200 unit sof Botox was used, any Botox not injected was wasted. The patient tolerated the procedure well, there were no complications of the above procedure.

## 2020-04-10 ENCOUNTER — Ambulatory Visit (INDEPENDENT_AMBULATORY_CARE_PROVIDER_SITE_OTHER): Payer: Medicare Other | Admitting: Neurology

## 2020-04-10 ENCOUNTER — Other Ambulatory Visit: Payer: Self-pay

## 2020-04-10 VITALS — Temp 97.2°F

## 2020-04-10 DIAGNOSIS — G43711 Chronic migraine without aura, intractable, with status migrainosus: Secondary | ICD-10-CM

## 2020-04-10 NOTE — Progress Notes (Signed)
Botox- 100 units x 2 vials Lot: [id number] Expiration: 06/2022 NDC: 7915-0569-79  Bacteriostatic 0.9% Sodium Chloride- 34mL total Lot: [id number] Expiration: 06/14/2020 NDC: 5374-8270-78  Dx: M75.449 B/B

## 2020-04-11 ENCOUNTER — Other Ambulatory Visit: Payer: Self-pay | Admitting: Neurology

## 2020-04-11 DIAGNOSIS — G2581 Restless legs syndrome: Secondary | ICD-10-CM

## 2020-04-11 DIAGNOSIS — K902 Blind loop syndrome, not elsewhere classified: Secondary | ICD-10-CM | POA: Diagnosis not present

## 2020-04-11 DIAGNOSIS — R790 Abnormal level of blood mineral: Secondary | ICD-10-CM

## 2020-04-11 DIAGNOSIS — D509 Iron deficiency anemia, unspecified: Secondary | ICD-10-CM

## 2020-04-11 DIAGNOSIS — K5080 Crohn's disease of both small and large intestine without complications: Secondary | ICD-10-CM | POA: Diagnosis not present

## 2020-04-11 DIAGNOSIS — D508 Other iron deficiency anemias: Secondary | ICD-10-CM | POA: Diagnosis not present

## 2020-04-11 DIAGNOSIS — D519 Vitamin B12 deficiency anemia, unspecified: Secondary | ICD-10-CM | POA: Diagnosis not present

## 2020-04-11 DIAGNOSIS — D501 Sideropenic dysphagia: Secondary | ICD-10-CM

## 2020-04-12 ENCOUNTER — Other Ambulatory Visit (HOSPITAL_COMMUNITY)
Admission: RE | Admit: 2020-04-12 | Discharge: 2020-04-12 | Disposition: A | Discharge status: Home / Self Care | Payer: Medicare Other | Source: Ambulatory Visit | Attending: Neurology | Admitting: Neurology

## 2020-04-12 ENCOUNTER — Ambulatory Visit (INDEPENDENT_AMBULATORY_CARE_PROVIDER_SITE_OTHER): Payer: Medicare Other | Admitting: *Deleted

## 2020-04-12 DIAGNOSIS — Z20822 Contact with and (suspected) exposure to covid-19: Secondary | ICD-10-CM | POA: Insufficient documentation

## 2020-04-12 DIAGNOSIS — I480 Paroxysmal atrial fibrillation: Secondary | ICD-10-CM | POA: Diagnosis not present

## 2020-04-12 DIAGNOSIS — Z01812 Encounter for preprocedural laboratory examination: Secondary | ICD-10-CM | POA: Diagnosis not present

## 2020-04-12 LAB — CUP PACEART REMOTE DEVICE CHECK
Date Time Interrogation Session: 20210429001615
Implantable Pulse Generator Implant Date: 20171206

## 2020-04-12 LAB — SARS CORONAVIRUS 2 (TAT 6-24 HRS): SARS Coronavirus 2: NEGATIVE

## 2020-04-12 NOTE — Progress Notes (Signed)
ILR Remote 

## 2020-04-13 NOTE — Progress Notes (Signed)
Negative corona test

## 2020-04-16 NOTE — Progress Notes (Signed)
Covid test negative

## 2020-04-17 ENCOUNTER — Ambulatory Visit (INDEPENDENT_AMBULATORY_CARE_PROVIDER_SITE_OTHER): Payer: Medicare Other | Admitting: Neurology

## 2020-04-17 DIAGNOSIS — Z9884 Bariatric surgery status: Secondary | ICD-10-CM

## 2020-04-17 DIAGNOSIS — G4733 Obstructive sleep apnea (adult) (pediatric): Secondary | ICD-10-CM

## 2020-04-17 DIAGNOSIS — G2581 Restless legs syndrome: Secondary | ICD-10-CM

## 2020-04-17 DIAGNOSIS — D508 Other iron deficiency anemias: Secondary | ICD-10-CM

## 2020-04-18 DIAGNOSIS — M7061 Trochanteric bursitis, right hip: Secondary | ICD-10-CM | POA: Diagnosis not present

## 2020-04-20 ENCOUNTER — Ambulatory Visit: Payer: Medicare Other | Admitting: Podiatry

## 2020-04-24 DIAGNOSIS — D508 Other iron deficiency anemias: Secondary | ICD-10-CM | POA: Insufficient documentation

## 2020-04-24 NOTE — Procedures (Signed)
PATIENT'S NAME:  [name], [name] DOB:      [birth year]      MR#:    [id number]     DATE OF RECORDING: 04/17/2020 REFERRING M.D.:  [name], DO Study Performed:  Split-Night Titration Study HISTORY:  73 y.o. established female on 03-27-2020 as a revisit for CPAP dysfunction- snoring and restless legs. She has severe OSA, controlled on CPAP and hypersomnia. She has been diagnosed with dystonia, cervicalgia and ophthalmic migraines.  Pacemaker, atrial fib patient. Dr Daneen Schick.  For RLS her iron infusion therapy worked well from December to about March 12th- will check ferritin today.  CPAP new machine is coming up, she lost some weight, has gained exercise tolerance.   It is set between a minimum setting of 7 and a maximum pressure setting of 18 cmH2O pressure is 2 cm expiratory pressure relief.  The residual AHI is 2.6 which is an excellent resolution.  The 95th percentile pressure is 12.8cm and she does have moderate air leaks.  We will need a baseline in order to justify the new CPAP machine.  Epworth sleepiness score was endorsed at 4, and the fatigue severity questionnaire at 13 points.  The patient endorsed the Epworth Sleepiness Scale at 4 points   The patient's weight 238 pounds with a height of 67 (inches), resulting in a BMI of 37.4 kg/m2. The patient's neck circumference measured 15 inches.  CURRENT MEDICATIONS: Aspirin, Lioresal, Zebeta, Voltaren, Cymbalta, Aimovig, Nexium, Lasix, Neurontin, Norco, Claritin, Zofran, Proair, Qualaquin, Requip, Topamax, Ambien, Adderall, Tosymra   PROCEDURE:  This is a multichannel digital polysomnogram utilizing the Somnostar 11.2 system.  Electrodes and sensors were applied and monitored per AASM Specifications.   EEG, EOG, Chin and Limb EMG, were sampled at 200 Hz.  ECG, Snore and Nasal Pressure, Thermal Airflow, Respiratory Effort, CPAP Flow and Pressure, Oximetry was sampled at 50 Hz. Digital video and audio were recorded.      BASELINE STUDY WITHOUT CPAP  RESULTS: Lights Out was at 22:24 and Lights On at 05:00.  Total recording time (TRT) was 163, with a total sleep time (TST) of 141 minutes.  The patient's sleep latency was 2.5 minutes. REM latency was 0 minutes.  The sleep efficiency was 86.5 %.   SLEEP ARCHITECTURE: WASO (Wake after sleep onset) was 18 minutes, Stage N1 was 29 minutes, Stage N2 was 65 minutes, Stage N3 was 47 minutes and Stage R (REM sleep) was 0 minutes.  The percentages were Stage N1 20.6%, Stage N2 46.1%, Stage N3 33.3% and Stage R (REM sleep) 0%.   RESPIRATORY ANALYSIS:  There were a total of 18 respiratory events:  0 obstructive apneas, 0 central apneas and 0 mixed apneas with a total of 0 apneas and an apnea index (AI) of 0. There were 18 hypopneas with a hypopnea index of 7.7. The patient also had 0 respiratory event related arousals (RERAs).     The total APNEA/HYPOPNEA INDEX (AHI) was 7.7 /hour and the total RESPIRATORY DISTURBANCE INDEX was 7.7 /hour.  no events occurred in REM sleep and 36 events in NREM. The patient spent 172.5 minutes sleep time in the supine position 197 minutes in non-supine.  The supine AHI was 12.9 /hour versus a non-supine AHI of 0.0 /hour.  OXYGEN SATURATION & C02:  The wake baseline 02 saturation was 94%, with the lowest being 80%. Time spent below 89% saturation equaled 29 minutes.  The patient had a total of 0 Periodic Limb Movements.  The Periodic Limb Movement (PLM)  index was 0 /hour and the PLM Arousal index was 0 /hour. The arousals were noted as: 55 were spontaneous, 0 were associated with PLMs, 12 were associated with respiratory events.  TITRATION STUDY WITH CPAP RESULTS: CPAP was initiated at 5 cmH20 with heated humidity per AASM split night standards and pressure was advanced to 12/12 cmH20 because of hypopneas, apneas and desaturations.  At a PAP pressure of 12 cmH20, there was a reduction of the AHI to 2. /hour.   Total recording time (TRT) was 233.5 minutes, with a total sleep time  (TST) of 228 minutes. The patient's sleep latency was 2.5 minutes. REM latency was 150 minutes.  The sleep efficiency was 97.6 %.   SLEEP ARCHITECTURE: Wake after sleep was 3 minutes, Stage N1 9 minutes, Stage N2 31.5 minutes, Stage N3 152 minutes and Stage R (REM sleep) 35.5 minutes. The percentages were: Stage N1 3.9%, Stage N2 13.8%, Stage N3 66.7% and Stage R (REM sleep) 15.6%.   RESPIRATORY ANALYSIS:  There were a total of 11 respiratory events: 0 obstructive apneas, 0 central apneas and 0 mixed apneas with a total of 0 apneas and an apnea index (AI) of 0. There were 11 hypopneas with a hypopnea index of 2.9 /hour. The patient also had 0 respiratory event related arousals (RERAs).     The total APNEA/HYPOPNEA INDEX (AHI) was 2.9 /hour and the total RESPIRATORY DISTURBANCE INDEX was 2.9 /hour. 5 events occurred in REM sleep and 6 events in NREM. The REM AHI was 8.5 /hour versus a non-REM AHI of 1.9 /hour. The supine AHI was 4.7 /hour, versus a non-supine AHI of 1.7/hour.  OXYGEN SATURATION & C02:  The wake baseline 02 saturation was 92%, with the lowest being 75%. Time spent below 89% saturation equaled 21 minutes. The arousals were noted as: 19 were spontaneous, 0 were associated with PLMs, 6 were associated with respiratory events. The patient had a total of 0 Periodic Limb Movements. EKG was in normal sinus rhythm.  POLYSOMNOGRAPHY IMPRESSION :   1. Mild Obstructive Sleep Apnea (OSA) with a good resolution to CPAP at 8 cm water, residual AHI was 0.8/h. however REM sleep rebounded at 8-11 cm water pressure.    RECOMMENDATIONS: Auto -titration capable CPAP device with a setting from 6 through 14 cm water, 2 cm EPR and heated humidity, with a DreamWear FFM in small size, nasal cradle.  A follow up appointment will be scheduled in the Sleep Clinic at [facility].    I certify that I have reviewed the entire raw data recording prior to the issuance of this report in accordance  with the Standards of Accreditation of the American Academy of Sleep Medicine (AASM)  Larey Seat, M.D. Diplomat, Tax adviser of Psychiatry and Neurology  Diplomat, Tax adviser of Sleep Medicine Market researcher, [employer] [facility] at [employer]

## 2020-04-24 NOTE — Addendum Note (Signed)
Addended by: Larey Seat on: 04/24/2020 05:41 PM   Modules accepted: Orders

## 2020-04-24 NOTE — Progress Notes (Signed)
POLYSOMNOGRAPHY IMPRESSION :   1. Mild Obstructive Sleep Apnea (OSA) with a good resolution to  CPAP at 8 cm water, residual AHI was 0.8/h. however REM sleep  rebounded at pressures between 8-11 cm water .    RECOMMENDATIONS: Auto -titration capable CPAP device with a  setting from 6 through 14 cm water, 2 cm EPR and heated humidity,  with a DreamWear FFM in small size, nasal cradle.  A follow up appointment will be scheduled in the Sleep Clinic at  [facility].

## 2020-04-26 ENCOUNTER — Encounter: Payer: Self-pay | Admitting: Neurology

## 2020-04-26 ENCOUNTER — Telehealth: Payer: Self-pay | Admitting: Neurology

## 2020-04-26 NOTE — Telephone Encounter (Signed)
I called pt. I advised pt that Dr. Brett Fairy reviewed their sleep study results and found that pt has sleep apnea still present. Dr. Brett Fairy recommends that pt starts auto CPAP. I reviewed PAP compliance expectations with the pt. Pt is agreeable to starting a CPAP. I advised pt that an order will be sent to a DME, [facility], and [facility] will call the pt within about one week after they file with the pt's insurance. [facility] will show the pt how to use the machine, fit for masks, and troubleshoot the CPAP if needed. A follow up appt was made for insurance purposes with Dr. Brett Fairy on Aug 17,2021 at 1:30 pm. Pt verbalized understanding to arrive 15 minutes early and bring their CPAP. A letter with all of this information in it will be mailed to the pt as a reminder. I verified with the pt that the address we have on file is correct. Pt verbalized understanding of results. Pt had no questions at this time but was encouraged to call back if questions arise. I have sent the order to [facility] and have received confirmation that they have received the order.

## 2020-04-26 NOTE — Telephone Encounter (Signed)
-----   Message from Larey Seat, MD sent at 04/24/2020  5:41 PM EDT ----- POLYSOMNOGRAPHY IMPRESSION :   1. Mild Obstructive Sleep Apnea (OSA) with a good resolution to  CPAP at 8 cm water, residual AHI was 0.8/h. however REM sleep  rebounded at pressures between 8-11 cm water .    RECOMMENDATIONS: Auto -titration capable CPAP device with a  setting from 6 through 14 cm water, 2 cm EPR and heated humidity,  with a DreamWear FFM in small size, nasal cradle.  A follow up appointment will be scheduled in the Sleep Clinic at  [facility].

## 2020-05-07 ENCOUNTER — Telehealth: Payer: Self-pay

## 2020-05-07 NOTE — Telephone Encounter (Signed)
Left message for patient to inform of disconnected monitor. 

## 2020-05-17 ENCOUNTER — Ambulatory Visit (INDEPENDENT_AMBULATORY_CARE_PROVIDER_SITE_OTHER): Payer: Medicare Other | Admitting: *Deleted

## 2020-05-17 DIAGNOSIS — I639 Cerebral infarction, unspecified: Secondary | ICD-10-CM

## 2020-05-17 LAB — CUP PACEART REMOTE DEVICE CHECK
Date Time Interrogation Session: 20210602231529
Implantable Pulse Generator Implant Date: 20171206

## 2020-05-22 NOTE — Progress Notes (Signed)
Carelink Summary Report / Loop Recorder 

## 2020-06-19 ENCOUNTER — Ambulatory Visit (INDEPENDENT_AMBULATORY_CARE_PROVIDER_SITE_OTHER): Payer: Medicare Other | Admitting: *Deleted

## 2020-06-19 DIAGNOSIS — I639 Cerebral infarction, unspecified: Secondary | ICD-10-CM | POA: Diagnosis not present

## 2020-06-19 LAB — CUP PACEART REMOTE DEVICE CHECK
Date Time Interrogation Session: 20210706023102
Implantable Pulse Generator Implant Date: 20171206

## 2020-06-19 NOTE — Progress Notes (Deleted)
Cardiology Office Note:    Date:  06/19/2020   ID:  [name], DOB 03-08-1946, MRN [id number]  PCP:  [name], DO  Cardiologist:  Sinclair Grooms, MD   Referring MD: [name], DO   No chief complaint on file.   History of Present Illness:    [name] is a 74 y.o. female with a hx of obstructive sleep apnea,severe hyperlipidemia,anomalous origin of the right coronary, secondary pulmonary hypertension, lower extremity swelling, recent CVA.Loop recorder implantation to exclude A. fib, has not demonstrated arrhythmia since implantation.  ***  Past Medical History:  Diagnosis Date  . 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 Arthritis    generalized  . Asthma   . Atrophic vaginitis   . Back pain    DDD/stenosis  . Carotid stenosis    Carotid US 10/16: Plaque RICA (6-19%), normal LICA  . Depression    takes Cymbalta daily  . Diverticulosis    benign  . DJD (degenerative joint disease)   . Dyslipidemia   . Dysrhythmia   . Family history of GI bleeding   . GERD (gastroesophageal reflux disease)    takes Nexium daily  . Gestational diabetes   . H/O hiatal hernia    surgery for hernia  . Headache(784.0)    takes Imitrex daily as needed and Bisoprolol daily;last migraine was about 2wks ago  . History of bronchitis 1 yr ago  . History of shingles   . Insomnia    takes Ambien nightly  . Joint pain   . Joint swelling   . Leg cramps    takes Flexeril daily as needed  . 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 Nocturia   . Osteoporosis   . Peripheral neuropathy    takes Gabapentin daily  . Pneumonia 68yrs ago   hx of  . Restless leg syndrome    takes Requip daily  . RLS (restless legs syndrome) 08/16/2015  . Sleep apnea    uses CPAP  . Tubular adenoma of colon   . Vertigo   . Vitamin D deficiency     Past Surgical History:  Procedure Laterality Date  . COLONOSCOPY    . EP IMPLANTABLE DEVICE N/A 11/19/2016   Procedure: Loop Recorder  Insertion;  Surgeon: Deboraha Sprang, MD;  Location: [place] [facility];  Service: Cardiovascular;  Laterality: N/A;  . ESOPHAGOGASTRODUODENOSCOPY    . excess skin removal     post weight loss  . GASTRIC BYPASS  2001  . HERNIA REPAIR     umbilical  . JOINT REPLACEMENT Right    knee  . skin reduction  2003 2004   stomach and arm  . STEROID INJECTION TO SCAR     x 2 in back   . TOTAL KNEE ARTHROPLASTY Left 04/03/2015   Procedure: LEFT TOTAL KNEE ARTHROPLASTY;  Surgeon: Melrose Nakayama, MD;  Location: [place];  Service: Orthopedics;  Laterality: Left;  . WISDOM TOOTH EXTRACTION      Current Medications: No outpatient medications have been marked as taking for the 06/20/20 encounter (Appointment) with [name], MD.     Allergies:   Patient has no known allergies.   Social History   Socioeconomic History  . Marital status: Married    Spouse name: Pilar Jarvis  . Number of children: 3  . Years of education: college  . Highest education level: Not on file  Occupational History  . Occupation: Retired  Tobacco Use  . Smoking status: Never Smoker  . Smokeless tobacco: Never Used  Vaping Use  . Vaping Use: Never used  Substance and Sexual Activity  . Alcohol use: No    Alcohol/week: 0.0 standard drinks  . Drug use: No  . Sexual activity: Yes    Birth control/protection: Post-menopausal  Other Topics Concern  . Not on file  Social History Narrative   Lives at home w/ her husband   Caffeine 8-10 cups daily.  (coffee 1 cup am, unsw tea all day long).    Social Determinants of Health   Financial Resource Strain:   . Difficulty of Paying Living Expenses:   Food Insecurity:   . Worried About Charity fundraiser in the Last Year:   . Arboriculturist in the Last Year:   Transportation Needs:   . Film/video editor (Medical):   Marland Kitchen Lack of Transportation (Non-Medical):   Physical Activity:   . Days of Exercise per Week:   . Minutes of Exercise per Session:   Stress:   .  Feeling of Stress :   Social Connections:   . Frequency of Communication with Friends and Family:   . Frequency of Social Gatherings with Friends and Family:   . Attends Religious Services:   . Active Member of Clubs or Organizations:   . Attends Archivist Meetings:   Marland Kitchen Marital Status:      Family History: The patient's family history includes Breast cancer in her maternal grandmother; CAD in her mother and paternal grandfather; Colon cancer in her father; Heart disease in her mother; Hypertension in her mother; Neuropathy in her mother; Osteoarthritis in her mother.  ROS:   Please see the history of present illness.    *** All other systems reviewed and are negative.  EKGs/Labs/Other Studies Reviewed:    The following studies were reviewed today:  LOWER EXTREMITY Doppler 2020: Summary:  Right: Resting right ankle-brachial index is within normal range. No  evidence of significant right lower extremity arterial disease. The right  toe-brachial index is normal.   Left: Resting left ankle-brachial index is within normal range. No  evidence of significant left lower extremity arterial disease. The left  toe-brachial index is normal.   EKG:  EKG ***  Recent Labs: 10/25/2019: ALT 14; BUN 15; Creatinine, Ser 0.90; Hemoglobin 12.9; Platelets 284; Potassium 4.0; Sodium 141  Recent Lipid Panel    Component Value Date/Time   CHOL 223 (H) 02/27/2016 1001   TRIG 143 02/27/2016 1001   HDL 48 02/27/2016 1001   CHOLHDL 4.6 (H) 02/27/2016 1001   LDLCALC 146 (H) 02/27/2016 1001    Physical Exam:    VS:  There were no vitals taken for this visit.    Wt Readings from Last 3 Encounters:  03/27/20 238 lb (108 kg)  01/10/20 239 lb (108.4 kg)  11/23/19 243 lb (110.2 kg)     GEN: ***. No acute distress HEENT: Normal NECK: No JVD. LYMPHATICS: No lymphadenopathy CARDIAC: *** RRR without murmur, gallop, or edema. VASCULAR: *** Normal Pulses. No bruits. RESPIRATORY:  Clear  to auscultation without rales, wheezing or rhonchi  ABDOMEN: Soft, non-tender, non-distended, No pulsatile mass, MUSCULOSKELETAL: No deformity  SKIN: Warm and dry NEUROLOGIC:  Alert and oriented x 3 PSYCHIATRIC:  Normal affect   ASSESSMENT:    1. Paroxysmal atrial fibrillation (HCC)   2. Congenital anomaly of coronary artery   3. Claudication ([place])   4. Cerebrovascular accident (CVA), unspecified mechanism (  [place])   5. Obstructive sleep apnea   6. Familial hypercholesterolemia   7. Educated about COVID-19 virus infection    PLAN:    In order of problems listed above:  1. ***   Medication Adjustments/Labs and Tests Ordered: Current medicines are reviewed at length with the patient today.  Concerns regarding medicines are outlined above.  No orders of the defined types were placed in this encounter.  No orders of the defined types were placed in this encounter.   There are no Patient Instructions on file for this visit.   Signed, Sinclair Grooms, MD  06/19/2020 6:47 PM    [facility]

## 2020-06-20 ENCOUNTER — Ambulatory Visit: Payer: Medicare Other | Admitting: Interventional Cardiology

## 2020-06-20 ENCOUNTER — Other Ambulatory Visit: Payer: Self-pay

## 2020-06-20 ENCOUNTER — Telehealth: Payer: Self-pay | Admitting: Neurology

## 2020-06-20 NOTE — Progress Notes (Signed)
Carelink Summary Report / Loop Recorder 

## 2020-06-20 NOTE — Telephone Encounter (Signed)
Called the pt because Dr Brett Fairy is out of the office for the afternoon. lvm advising that the patient had an apt scheduled for aug for initial CPAP. I am unsure if this apt is truly needed if so there was an availability with Debbora Presto, NP

## 2020-06-21 ENCOUNTER — Ambulatory Visit: Payer: Medicare Other | Admitting: Neurology

## 2020-06-22 DIAGNOSIS — M47816 Spondylosis without myelopathy or radiculopathy, lumbar region: Secondary | ICD-10-CM | POA: Diagnosis not present

## 2020-06-22 DIAGNOSIS — M1990 Unspecified osteoarthritis, unspecified site: Secondary | ICD-10-CM | POA: Diagnosis not present

## 2020-06-22 DIAGNOSIS — J45901 Unspecified asthma with (acute) exacerbation: Secondary | ICD-10-CM | POA: Diagnosis not present

## 2020-06-22 DIAGNOSIS — H6692 Otitis media, unspecified, left ear: Secondary | ICD-10-CM | POA: Diagnosis not present

## 2020-06-23 ENCOUNTER — Other Ambulatory Visit: Payer: Self-pay | Admitting: Neurology

## 2020-06-28 ENCOUNTER — Ambulatory Visit: Payer: Medicare Other | Admitting: Neurology

## 2020-07-10 ENCOUNTER — Ambulatory Visit (INDEPENDENT_AMBULATORY_CARE_PROVIDER_SITE_OTHER): Payer: Medicare Other | Admitting: Neurology

## 2020-07-10 DIAGNOSIS — G43711 Chronic migraine without aura, intractable, with status migrainosus: Secondary | ICD-10-CM | POA: Diagnosis not present

## 2020-07-10 NOTE — Progress Notes (Signed)
Consent Form Botulism Toxin Injection For Chronic Migraine  Interval history 07/10/2020: Patient's migraines improved on botox > 80% in severity and frequency. +a  Reviewed orally with patient, additionally signature is on file:  Botulism toxin has been approved by the Federal drug administration for treatment of chronic migraine. Botulism toxin does not cure chronic migraine and it may not be effective in some patients.  The administration of botulism toxin is accomplished by injecting a small amount of toxin into the muscles of the neck and head. Dosage must be titrated for each individual. Any benefits resulting from botulism toxin tend to wear off after 3 months with a repeat injection required if benefit is to be maintained. Injections are usually done every 3-4 months with maximum effect peak achieved by about 2 or 3 weeks. Botulism toxin is expensive and you should be sure of what costs you will incur resulting from the injection.  The side effects of botulism toxin use for chronic migraine may include:   -Transient, and usually mild, facial weakness with facial injections  -Transient, and usually mild, head or neck weakness with head/neck injections  -Reduction or loss of forehead facial animation due to forehead muscle weakness  -Eyelid drooping  -Dry eye  -Pain at the site of injection or bruising at the site of injection  -Double vision  -Potential unknown long term risks  Contraindications: You should not have Botox if you are pregnant, nursing, allergic to albumin, have an infection, skin condition, or muscle weakness at the site of the injection, or have myasthenia gravis, Lambert-Eaton syndrome, or ALS.  It is also possible that as with any injection, there may be an allergic reaction or no effect from the medication. Reduced effectiveness after repeated injections is sometimes seen and rarely infection at the injection site may occur. All care will be taken to prevent these  side effects. If therapy is given over a long time, atrophy and wasting in the muscle injected may occur. Occasionally the patient's become refractory to treatment because they develop antibodies to the toxin. In this event, therapy needs to be modified.  I have read the above information and consent to the administration of botulism toxin.    BOTOX PROCEDURE NOTE FOR MIGRAINE HEADACHE    Contraindications and precautions discussed with patient(above). Aseptic procedure was observed and patient tolerated procedure. Procedure performed by Dr. Georgia Dom  The condition has existed for more than 6 months, and pt does not have a diagnosis of ALS, Myasthenia Gravis or Lambert-Eaton Syndrome.  Risks and benefits of injections discussed and pt agrees to proceed with the procedure.  Written consent obtained  These injections are medically necessary. Pt  receives good benefits from these injections. These injections do not cause sedations or hallucinations which the oral therapies may cause.  Description of procedure:  The patient was placed in a sitting position. The standard protocol was used for Botox as follows, with 5 units of Botox injected at each site:   -Procerus muscle, midline injection  -Corrugator muscle, bilateral injection  -Frontalis muscle, bilateral injection, with 2 sites each side, medial injection was performed in the upper one third of the frontalis muscle, in the region vertical from the medial inferior edge of the superior orbital rim. The lateral injection was again in the upper one third of the forehead vertically above the lateral limbus of the cornea, 1.5 cm lateral to the medial injection site.  -Temporalis muscle injection, 5 sites, bilaterally. The first injection was 3  cm above the tragus of the ear, second injection site was 1.5 cm to 3 cm up from the first injection site in line with the tragus of the ear. The third injection site was 1.5-3 cm forward between the  first 2 injection sites. The fourth injection site was 1.5 cm posterior to the second injection site. 5th site laterally in the temporalis  muscleat the level of the outer canthus.  - Patient feels her clenching is a trigger for headaches. +5 units masseter bilaterally   - Patient feels the migraines are centered around the eyes +5 units bilaterally at the outer canthus in the orbicularis occuli  -Occipitalis muscle injection, 3 sites, bilaterally. The first injection was done one half way between the occipital protuberance and the tip of the mastoid process behind the ear. The second injection site was done lateral and superior to the first, 1 fingerbreadth from the first injection. The third injection site was 1 fingerbreadth superiorly and medially from the first injection site.  -Cervical paraspinal muscle injection, 2 sites, bilateral knee first injection site was 1 cm from the midline of the cervical spine, 3 cm inferior to the lower border of the occipital protuberance. The second injection site was 1.5 cm superiorly and laterally to the first injection site.  -Trapezius muscle injection was performed at 3 sites, bilaterally. The first injection site was in the upper trapezius muscle halfway between the inflection point of the neck, and the acromion. The second injection site was one half way between the acromion and the first injection site. The third injection was done between the first injection site and the inflection point of the neck.   Will return for repeat injection in 3 months.   A 200 unit sof Botox was used, any Botox not injected was wasted. The patient tolerated the procedure well, there were no complications of the above procedure.

## 2020-07-10 NOTE — Progress Notes (Signed)
Botox- 200 units x 1 vial Lot: [id number] Expiration: 03/2023 NDC: 6587-1841-08  Bacteriostatic 0.9% Sodium Chloride- 79mL total Lot: [id number] Expiration: 09/14/2021 NDC: 3109-1456-02  Dx: F82.960 B/B

## 2020-07-13 ENCOUNTER — Other Ambulatory Visit: Payer: Self-pay

## 2020-07-13 ENCOUNTER — Encounter: Payer: Self-pay | Admitting: Interventional Cardiology

## 2020-07-13 ENCOUNTER — Telehealth: Payer: Self-pay | Admitting: Pharmacist

## 2020-07-13 ENCOUNTER — Ambulatory Visit (INDEPENDENT_AMBULATORY_CARE_PROVIDER_SITE_OTHER): Payer: Medicare Other | Admitting: Interventional Cardiology

## 2020-07-13 VITALS — BP 122/72 | HR 80 | Ht 67.0 in | Wt 230.8 lb

## 2020-07-13 DIAGNOSIS — E7801 Familial hypercholesterolemia: Secondary | ICD-10-CM

## 2020-07-13 DIAGNOSIS — G4733 Obstructive sleep apnea (adult) (pediatric): Secondary | ICD-10-CM

## 2020-07-13 DIAGNOSIS — I480 Paroxysmal atrial fibrillation: Secondary | ICD-10-CM

## 2020-07-13 DIAGNOSIS — Z7189 Other specified counseling: Secondary | ICD-10-CM | POA: Diagnosis not present

## 2020-07-13 DIAGNOSIS — I639 Cerebral infarction, unspecified: Secondary | ICD-10-CM | POA: Diagnosis not present

## 2020-07-13 DIAGNOSIS — E782 Mixed hyperlipidemia: Secondary | ICD-10-CM

## 2020-07-13 MED ORDER — EVOLOCUMAB 140 MG/ML ~~LOC~~ SOAJ: 1.0000 mL | SUBCUTANEOUS | 3 refills | Status: AC

## 2020-07-13 NOTE — Telephone Encounter (Signed)
Spoke with patient in room regarding [name].  Patient needs updated Rx since previous one expired.  Has been using the Pushtronex but would like to switch to the [employer].  Pushtronex sample given to patient since she has been out  Medication Samples have been provided to the patient.  Drug name: Repatha       Strength: 420 mg        Qty: 1  LOT: [id number]  Exp.Date: 07/2020 (med expires a end of month)   The patient has been instructed regarding the correct time, dose, and frequency of taking this medication, including desired effects and most common side effects.   [name] 10:10 AM 07/13/2020

## 2020-07-13 NOTE — Progress Notes (Signed)
Cardiology Office Note:    Date:  07/13/2020   ID:  [name], DOB 09/23/1946, MRN [id number]  PCP:  [name], DO  Cardiologist:  Sinclair Grooms, MD   Referring MD: [name], DO   Chief Complaint  Patient presents with  . Atrial Fibrillation    History of Present Illness:    [name] is a 74 y.o. female with a hx of obstructive sleep apnea,severe hyperlipidemia on Repatha,anomalous origin of the right coronary, secondary pulmonary hypertension, lower extremity swelling, recent CVA.Loop recorder implantation to exclude A. fib, has not demonstrated arrhythmia since implantation.  She is not having significant symptoms.  No prolonged heart racing.  Compliant with medications.  Has some dyspnea on exertion.  She has lost 8 pounds.  No orthopnea, edema, or chest pain.  Past Medical History:  Diagnosis Date  . 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 Arthritis    generalized  . Asthma   . Atrophic vaginitis   . Back pain    DDD/stenosis  . Carotid stenosis    Carotid US 10/16: Plaque RICA (4-49%), normal LICA  . Depression    takes Cymbalta daily  . Diverticulosis    benign  . DJD (degenerative joint disease)   . Dyslipidemia   . Dysrhythmia   . Family history of GI bleeding   . GERD (gastroesophageal reflux disease)    takes Nexium daily  . Gestational diabetes   . H/O hiatal hernia    surgery for hernia  . Headache(784.0)    takes Imitrex daily as needed and Bisoprolol daily;last migraine was about 2wks ago  . History of bronchitis 1 yr ago  . History of shingles   . Insomnia    takes Ambien nightly  . Joint pain   . Joint swelling   . Leg cramps    takes Flexeril daily as needed  . 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 Nocturia   . Osteoporosis   . Peripheral neuropathy    takes Gabapentin daily  . Pneumonia 74yrs ago   hx of  . Restless leg syndrome    takes Requip daily  . RLS (restless legs syndrome) 08/16/2015  .  Sleep apnea    uses CPAP  . Tubular adenoma of colon   . Vertigo   . Vitamin D deficiency     Past Surgical History:  Procedure Laterality Date  . COLONOSCOPY    . EP IMPLANTABLE DEVICE N/A 11/19/2016   Procedure: Loop Recorder Insertion;  Surgeon: Deboraha Sprang, MD;  Location: [facility];  Service: Cardiovascular;  Laterality: N/A;  . ESOPHAGOGASTRODUODENOSCOPY    . excess skin removal     post weight loss  . GASTRIC BYPASS  2001  . HERNIA REPAIR     umbilical  . JOINT REPLACEMENT Right    knee  . skin reduction  2003 2004   stomach and arm  . STEROID INJECTION TO SCAR     x 2 in back   . TOTAL KNEE ARTHROPLASTY Left 04/03/2015   Procedure: LEFT TOTAL KNEE ARTHROPLASTY;  Surgeon: Melrose Nakayama, MD;  Location: [facility];  Service: Orthopedics;  Laterality: Left;  . WISDOM TOOTH EXTRACTION      Current Medications: Current Meds  Medication Sig  . allopurinol (ZYLOPRIM) 100 MG tablet TK 1 T PO QD FOR GOUT PREVENTION OR FLARE  . amphetamine-dextroamphetamine (ADDERALL) 10 MG tablet Take 1 tablet (10 mg total) by  mouth daily before breakfast.  . ASPIRIN 81 PO Take 81 mg by mouth at bedtime.  . baclofen (LIORESAL) 10 MG tablet TAKE 1 TABLET(10 MG) BY MOUTH TWICE DAILY  . bisoprolol (ZEBETA) 10 MG tablet Take 10 mg by mouth 2 (two) times daily.  . calcium carbonate (OS-CAL) 1250 (500 CA) MG chewable tablet Chew 1 tablet by mouth daily. Not sure of dosage  . diclofenac sodium (VOLTAREN) 1 % GEL Apply 4 g topically 4 (four) times daily.  . DULoxetine (CYMBALTA) 60 MG capsule Take 60 mg by mouth daily after supper.  Eduard Roux (AIMOVIG) 140 MG/ML SOAJ Inject 140 mg into the skin every 30 (thirty) days.  Marland Kitchen esomeprazole (NEXIUM) 40 MG capsule Take 40 mg by mouth 2 (two) times daily before a meal.   . Evolocumab with Infusor (Hanna) 420 MG/3.5ML SOCT Inject 1 Cartridge into the skin every 30 (thirty) days.  . furosemide (LASIX) 20 MG tablet Take 40 mg by  mouth daily as needed for fluid or edema.  Marland Kitchen HYDROcodone-acetaminophen (NORCO) 10-325 MG tablet Take 1 tablet by mouth every 8 (eight) hours as needed for moderate pain or severe pain. Takes for migraines  . Loratadine (CLARITIN) 10 MG CAPS Take 1 capsule by mouth as needed (for allergies).   . ondansetron (ZOFRAN-ODT) 4 MG disintegrating tablet DISSOLVE 1 TABLET(4 MG) ON THE TONGUE EVERY 8 HOURS AS NEEDED FOR NAUSEA OR VOMITING  . PROAIR HFA 108 (90 Base) MCG/ACT inhaler 2 INHALATIONS EVERY 4 HOURS AS NEEDED FOR ASTHMA SYMPTOMS (WHEEZING/SHORTNESS OF BREATH)  . quiNINE (QUALAQUIN) 324 MG capsule Take 1 capsule (324 mg total) by mouth 2 (two) times daily.  Marland Kitchen rOPINIRole (REQUIP) 1 MG tablet TAKE 1 TABLET(1 MG) BY MOUTH TWICE DAILY BEFORE LUNCH AND SUPPER  . Vitamin D, Ergocalciferol, (DRISDOL) 50000 units CAPS capsule Take 1 capsule (50,000 Units total) by mouth every 7 (seven) days.  Marland Kitchen zolpidem (AMBIEN) 10 MG tablet      Allergies:   Patient has no known allergies.   Social History   Socioeconomic History  . Marital status: Married    Spouse name: Pilar Jarvis  . Number of children: 3  . Years of education: college  . Highest education level: Not on file  Occupational History  . Occupation: Retired  Tobacco Use  . Smoking status: Never Smoker  . Smokeless tobacco: Never Used  Vaping Use  . Vaping Use: Never used  Substance and Sexual Activity  . Alcohol use: No    Alcohol/week: 0.0 standard drinks  . Drug use: No  . Sexual activity: Yes    Birth control/protection: Post-menopausal  Other Topics Concern  . Not on file  Social History Narrative   Lives at home w/ her husband   Caffeine 8-10 cups daily.  (coffee 1 cup am, unsw tea all day long).    Social Determinants of Health   Financial Resource Strain:   . Difficulty of Paying Living Expenses:   Food Insecurity:   . Worried About Charity fundraiser in the Last Year:   . Arboriculturist in the Last Year:     Transportation Needs:   . Film/video editor (Medical):   Marland Kitchen Lack of Transportation (Non-Medical):   Physical Activity:   . Days of Exercise per Week:   . Minutes of Exercise per Session:   Stress:   . Feeling of Stress :   Social Connections:   . Frequency of Communication with Friends and Family:   .  Frequency of Social Gatherings with Friends and Family:   . Attends Religious Services:   . Active Member of Clubs or Organizations:   . Attends Archivist Meetings:   Marland Kitchen Marital Status:      Family History: The patient's family history includes Breast cancer in her maternal grandmother; CAD in her mother and paternal grandfather; Colon cancer in her father; Heart disease in her mother; Hypertension in her mother; Neuropathy in her mother; Osteoarthritis in her mother.  ROS:   Please see the history of present illness.    Some lower extremity swelling if prolonged sitting or standing.  She does take aspirin 81 mg/day.  All other systems reviewed and are negative.  EKGs/Labs/Other Studies Reviewed:    The following studies were reviewed today: Review of loop recordings reveal no recurrences of atrial fibrillation.  EKG:  EKG normal sinus rhythm.  Normal EKG with exception of biatrial abnormality  Recent Labs: 10/25/2019: ALT 14; BUN 15; Creatinine, Ser 0.90; Hemoglobin 12.9; Platelets 284; Potassium 4.0; Sodium 141  Recent Lipid Panel    Component Value Date/Time   CHOL 223 (H) 02/27/2016 1001   TRIG 143 02/27/2016 1001   HDL 48 02/27/2016 1001   CHOLHDL 4.6 (H) 02/27/2016 1001   LDLCALC 146 (H) 02/27/2016 1001    Physical Exam:    VS:  BP 122/72   Pulse 80   Ht 5\' 7"  (1.702 m)   Wt (!) 230 lb 12.8 oz (104.7 kg)   SpO2 97%   BMI 36.15 kg/m     Wt Readings from Last 3 Encounters:  07/13/20 (!) 230 lb 12.8 oz (104.7 kg)  03/27/20 238 lb (108 kg)  01/10/20 239 lb (108.4 kg)     GEN: Morbid obesity. No acute distress HEENT: Normal NECK: No  JVD. LYMPHATICS: No lymphadenopathy CARDIAC:  RRR without murmur, gallop, or edema. VASCULAR:  Normal Pulses. No bruits. RESPIRATORY:  Clear to auscultation without rales, wheezing or rhonchi  ABDOMEN: Soft, non-tender, non-distended, No pulsatile mass, MUSCULOSKELETAL: No deformity  SKIN: Warm and dry NEUROLOGIC:  Alert and oriented x 3 PSYCHIATRIC:  Normal affect   ASSESSMENT:    1. Paroxysmal atrial fibrillation (HCC)   2. Cerebrovascular accident (CVA), unspecified mechanism ([place])   3. Familial hypercholesterolemia   4. Morbid obesity ([place])   5. Obstructive sleep apnea   6. Educated about COVID-19 virus infection    PLAN:    In order of problems listed above:  1. No recurrent episodes of atrial fibrillation based upon loop recording reports. 2. No recurrent neurological events. 3. Repatha is being restarted today 4. Lost 8 pounds since last office visit 5. Compliant with CPAP. 6. COVID-19 vaccine received.  2 weeks ago she was very ill with fever and fatigue.   1 year follow-up.  Call if increasing dyspnea or problems.   Medication Adjustments/Labs and Tests Ordered: Current medicines are reviewed at length with the patient today.  Concerns regarding medicines are outlined above.  Orders Placed This Encounter  Procedures  . EKG 12-Lead   No orders of the defined types were placed in this encounter.   Patient Instructions  Medication Instructions:  Your physician recommends that you continue on your current medications as directed. Please refer to the Current Medication list given to you today.  *If you need a refill on your cardiac medications before your next appointment, please call your pharmacy*   Lab Work: None  If you have labs (blood work) drawn today and your tests  are completely normal, you will receive your results only by: Marland Kitchen MyChart Message (if you have MyChart) OR . A paper copy in the mail If you have any lab test that is abnormal or we need  to change your treatment, we will call you to review the results.   Testing/Procedures: None   Follow-Up: At [facility], you and your health needs are our priority.  As part of our continuing mission to provide you with exceptional heart care, we have created designated Provider Care Teams.  These Care Teams include your primary Cardiologist (physician) and Advanced Practice Providers (APPs -  Physician Assistants and Nurse Practitioners) who all work together to provide you with the care you need, when you need it.  We recommend signing up for the patient portal called "MyChart".  Sign up information is provided on this After Visit Summary.  MyChart is used to connect with patients for Virtual Visits (Telemedicine).  Patients are able to view lab/test results, encounter notes, upcoming appointments, etc.  Non-urgent messages can be sent to your provider as well.   To learn more about what you can do with MyChart, go to [email or web address].    Your next appointment:   12 month(s)  The format for your next appointment:   In Person  Provider:   You may see Sinclair Grooms, MD or one of the following Advanced Practice Providers on your designated Care Team:    Truitt Merle, NP  Cecilie Kicks, NP  Kathyrn Drown, NP    Other Instructions      Signed, Sinclair Grooms, MD  07/13/2020 9:34 AM    [place]

## 2020-07-13 NOTE — Patient Instructions (Signed)

## 2020-07-23 ENCOUNTER — Ambulatory Visit (INDEPENDENT_AMBULATORY_CARE_PROVIDER_SITE_OTHER): Payer: Medicare Other | Admitting: *Deleted

## 2020-07-23 DIAGNOSIS — I639 Cerebral infarction, unspecified: Secondary | ICD-10-CM

## 2020-07-23 LAB — CUP PACEART REMOTE DEVICE CHECK
Date Time Interrogation Session: 20210808023007
Implantable Pulse Generator Implant Date: 20171206

## 2020-07-24 NOTE — Progress Notes (Signed)
Carelink Summary Report / Loop Recorder 

## 2020-07-31 ENCOUNTER — Telehealth: Payer: Self-pay

## 2020-07-31 ENCOUNTER — Ambulatory Visit (INDEPENDENT_AMBULATORY_CARE_PROVIDER_SITE_OTHER): Payer: Medicare Other | Admitting: Neurology

## 2020-07-31 ENCOUNTER — Encounter: Payer: Self-pay | Admitting: Neurology

## 2020-07-31 VITALS — BP 128/76 | HR 68 | Ht 67.0 in | Wt 232.0 lb

## 2020-07-31 DIAGNOSIS — I639 Cerebral infarction, unspecified: Secondary | ICD-10-CM | POA: Diagnosis not present

## 2020-07-31 DIAGNOSIS — D509 Iron deficiency anemia, unspecified: Secondary | ICD-10-CM | POA: Diagnosis not present

## 2020-07-31 DIAGNOSIS — R790 Abnormal level of blood mineral: Secondary | ICD-10-CM

## 2020-07-31 DIAGNOSIS — G2581 Restless legs syndrome: Secondary | ICD-10-CM

## 2020-07-31 MED ORDER — QUININE SULFATE 324 MG PO CAPS: ORAL_CAPSULE | ORAL | 3 refills | Status: DC

## 2020-07-31 NOTE — Telephone Encounter (Signed)
ILR alert received RRT has been met as of 07/29/20. Discussed options with patient regarding options to leave device in or explant. Patient voices she would like to leave device in at this time. Advised patient to unplug box and I will get return kit sent to her house. Address verified.   Advised patient to call device clinic if she has any further questions or concerns. Verbalizes understanding.

## 2020-07-31 NOTE — Progress Notes (Signed)
SLEEP [facility]INE [facility]   Provider:  Larey Seat, M D  Referring Provider: [name], DO Primary Care Physician:    Chief Complaint  Patient presents with  . Follow-up    pt alone, here for insurance compliance initial CPAP visit for her new machine.  She states working well no converns. DME [employer]    HPI:  [name] is a 74 y.o. female Patient  Since 2006-2007, and is seen here on 07-31-2020- as a revisit for CPAP compliance after retitration:   I ordered a SPLIT study to get her a new machine: The patient underwent a SPLIT polysomnography on May 4, she had still mild obstructive sleep apnea under good resolution and response to CPAP at 8 cmH2O her residual AHI was 0.8 REM sleep rebounded between 8 and 11 cmH2O for which this new machine is set.  The recommendations were an auto titration capable CPAP device with a setting between 6 and 14 cmH2O to centimeter EPR heated humidity and a DreamWear full facemask of small size with nasal cradle.  The patient sleep study was a split-night titration.  She is now here showing me a 93% compliance with an average of 6 hours of nightly use.  Minimum pressure 6 maximum pressure 14 cmH2O EPR is 2 cmH2O the residual AHI is 1.6/h.  This is excellent she has very minor air leakage with her current mask, the 95th percentile pressure is 11.3 cmH2O well in the normal range and except for some weekend days she has always use the machine for the last month and even for the last 3 months.  The average air leak is low enough to not justify any changes in her interface.  Her Epworth sleepiness score is now at 4 points- .  Her migraines have significantly improved with Botox treatments under Dr. Cathren Laine guidance.   Hx: - snoring and restless legs. She has severe OSA , previously was well controlled on CPAP and hypersomnia. She has been diagnosed with dystonia , cervicalgia and ophthalmic migraines.   She is a former gastric Bypass Patient and suspected to have  malabsorption.  Pacemaker, atrial fib patient. Dr Daneen Schick.   The iron infusion worked well from December to about March 12th- will check ferritin today.  CPAP new machine is coming up, she lost some weight, has gained exercise tolerance.    Epworth sleepiness score was endorsed at 4 , and the fatigue severity questionnaire  at 13 points. She is now working on a balance improvement program.  Will check for labs. Ferritin and TIBC, last value was 13 % TIBC , ferritin 28 4 month ago.  Takes quinine , too.    Patient has had good success with botox treatment for migraine,  Occipital dystonia. last in June 2021.     Consent Form Botulism Toxin Injection For Chronic Migraine  Interval history 01/10/2020: Patient's migraines improved on botox > 70% in severity and frequency but we have been delayed due to covid for many months. +a  Reviewed orally with patient, additionally signature is on file:  Botulism toxin has been approved by the Federal drug administration for treatment of chronic migraine. Botulism toxin does not cure chronic migraine and it may not be effective in some patients.  The administration of botulism toxin is accomplished by injecting a small amount of toxin into the muscles of the neck and head. Dosage must be titrated for each individual. Any benefits resulting from botulism toxin tend to wear off after 3  months with a repeat injection required if benefit is to be maintained. Injections are usually done every 3-4 months with maximum effect peak achieved by about 2 or 3 weeks. Botulism toxin is expensive and you should be sure of what costs you will incur resulting from the injection.  The side effects of botulism toxin use for chronic migraine may include:   -Transient, and usually mild, facial weakness with facial injections  -Transient, and usually mild, head or neck weakness with head/neck injections  -Reduction or loss of forehead facial animation due to forehead  muscle weakness  -Eyelid drooping  -Dry eye  -Pain at the site of injection or bruising at the site of injection  -Double vision  -Potential unknown long term risks  Contraindications: You should not have Botox if you are pregnant, nursing, allergic to albumin, have an infection, skin condition, or muscle weakness at the site of the injection, or have myasthenia gravis, Lambert-Eaton syndrome, or ALS.  It is also possible that as with any injection, there may be an allergic reaction or no effect from the medication. Reduced effectiveness after repeated injections is sometimes seen and rarely infection at the injection site may occur. All care will be taken to prevent these side effects. If therapy is given over a long time, atrophy and wasting in the muscle injected may occur. Occasionally the patient's become refractory to treatment because they develop antibodies to the toxin. In this event, therapy needs to be modified. I have read the above information and consent to the administration of botulism toxin. BOTOX PROCEDURE NOTE FOR MIGRAINE HEADACHE Contraindications and precautions discussed with patient(above). Aseptic procedure was observed and patient tolerated procedure. Procedure performed by Dr. Georgia Dom The condition has existed for more than 6 months, and pt does not have a diagnosis of ALS, Myasthenia Gravis or Lambert-Eaton Syndrome.  Risks and benefits of injections discussed and pt agrees to proceed with the procedure.  Written consent obtained These injections are medically necessary. Pt  receives good benefits from these injections. These injections do not cause sedations or hallucinations which the oral therapies may cause.  Description of procedure:  The patient was placed in a sitting position. The standard protocol was used for Botox as follows, with 5 units of Botox injected at each site: -Procerus muscle, midline injection -Corrugator muscle, bilateral  injection -Frontalis muscle, bilateral injection, with 2 sites each side, medial injection was performed in the upper one third of the frontalis muscle, in the region vertical from the medial inferior edge of the superior orbital rim. The lateral injection was again in the upper one third of the forehead vertically above the lateral limbus of the cornea, 1.5 cm lateral to the medial injection site.  -Temporalis muscle injection, 5 sites, bilaterally. The first injection was 3 cm above the tragus of the ear, second injection site was 1.5 cm to 3 cm up from the first injection site in line with the tragus of the ear. The third injection site was 1.5-3 cm forward between the first 2 injection sites. The fourth injection site was 1.5 cm posterior to the second injection site. 5th site laterally in the temporalis  muscleat the level of the outer canthus. - Patient feels her clenching is a trigger for headaches. +5 units masseter bilaterally  - Patient feels the migraines are centered around the eyes +5 units bilaterally at the outer canthus in the orbicularis occuli -Occipitalis muscle injection, 3 sites, bilaterally. The first injection was done one half way  between the occipital protuberance and the tip of the mastoid process behind the ear. The second injection site was done lateral and superior to the first, 1 fingerbreadth from the first injection. The third injection site was 1 fingerbreadth superiorly and medially from the first injection site. -Cervical paraspinal muscle injection, 2 sites, bilateral knee first injection site was 1 cm from the midline of the cervical spine, 3 cm inferior to the lower border of the occipital protuberance. The second injection site was 1.5 cm superiorly and laterally to the first injection site. Trapezius muscle injection was performed at 3 sites, bilaterally. The first injection site was in the upper trapezius muscle halfway between the inflection point of the neck, and  the acromion. The second injection site was one half way between the acromion and the first injection site. The third injection was done between the first injection site and the inflection point of the neck. Will return for repeat injection in 3 months. A 200 unit sof Botox was used, any Botox not injected was wasted. The patient tolerated the procedure well, there were no complications of the above procedure.    [name] has a history of significant 0P -5 ER visit iron deficiency anemia and has gotten relief for her restless leg syndrome only after IV transfusion of iron products.  Aside from restless legs she is also a CPAP user for the presence of obstructive sleep apnea and she is due for a new machine.  She has kept her BMI under 40 and she looks healthy and in good shape today.  She is status post gastric bypass surgery.  As to the new CPAP machine I can order a machine I could also repeat her current sleep test in form of a home sleep test to give [place] a new baseline but before I ordered a home sleep test I want her treated for restless legs as I think that the results of a home sleep test would be significantly altered while she has restless legs.  It is visible as she is constantly rubbing her legs here today sitting across from me.    I have followed [name] from 2003 through 2008  And see her now for CPAP compliance.  The patient had undergone gastric bypass surgery, which led to an extensive loss of body weight. In the process she was no longer suffering from obstructive sleep apnea related to the weight loss, but she developed restless legs. Repeated iron studies had shown that she had extremely low ferritin levels. A ferritin level lower than 50 is associated with a much higher incidence of restless leg syndrome in addition her iron and iron binding capacities have been surprisingly normal and this conflict of data we have discussed today. Usually I would treat the patient with Ferrlecit  and infusion once every 3 months or so to keep up ferritin levels in in her case I am not sure how much free iron is actually in her system. 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 In addition she is vitamin D deficient which are almost 80% of adult Americans. Gained some weight back but she has no longer nausea or dumping syndrome so therefore she does not need Phenergan or Zofran. She is using gabapentin and ropinirole to treat her restless legs she often wakes up with a headache and she suffers from at times severe crippling migraines. Leg cramps also interfere with her sleep the initiation of sleep as well as the  sleep duration at night.  As her BMI exceeds 35, she may also have developed apnea again and snoring has been witnessed by her family. We are meeting today to reevaluate all these conditions in context. She is also excessively daytime sleepy for fatigue severity score is endorsed at 63 points her Epworth sleepiness score was endorsed at 15 points. Bedtime is usually 10 Pm and her husband leaves for work at 6.30 AM , after that she will sleep for 2-3 hours. Total daily sleep time is 14 hours, this is more likely related to depression.   [name] is [birth year]. #1 She is doing moderately well with her CPAP use was a compliance of 73% average user time on days of use is 4 hours and 44 minutes. Minimum pressure for this AutoSet 7 maximum 15 cm water. The patient's AHI was 2.5 which is a desirable result. The 95th percentile pressure is 12.3. The patient could not use the machine for about 2 weeks after she contracted an upper respiratory infection doing a vacation stay in [place]. She is now back to using it regularly.  #2 her restless leg question here of was endorsed at moderate impairment quality of life.  It seems that her iron levels have rebounded to some degree , but Ferritin results are pending. Her geriatric depression score does not indicate depression  her Epworth sleepiness score was endorsed at 8 points and her fatigue severity score 26 points.  #3 headache are a concern. She hates the pain clinic. We are not providing pain management in this clinic but I do feel that the patient could change with the same narcotic agreement and contract to [name]  Her main care provider. She has no history of overusing up using or accidentally overdosing any medications. Her depression scores are in normal range not indicated clinical depression at all. The patient is aware that she could receive with a headache attack Depakote IV. However I am not thrilled with the opportunity of preventing migrainous headaches by Depakote, given the risk of weight gain, liver failure tremor. She has been on a beta blocker which has helped to some degree. She would be interested in some kind of injection therapy but I explained that Botox injections in the Medicare population on very difficult to get paid for. And Botox is an extremely costly medication these days. The patient has some basic medical training and she would be willing to give herself a shot. We discussed TORADOL  which can be taken either as a 10 mg 2 pill or as an IM medication.  #4 patient will receive 1 mL equaling 2,000,000 g of Toradol intramuscular injection today for her acute headache. If this works well for her it could be an alternative treatment and hopefully will relieve her of the need to take narcotics to. The patient is a status post gastric bypass surgery so she cannot have nonsteroidal by mouth .  Interval history from 04/09/2016. [name] is seen here today after she has twice been seen by my colleague Dr. [name]. I had sent her to her to evaluate her headaches and possible dystonia and neuralgic character and she was referred for Dr. Kittie Plater for possible Botox injections. Within the workup for headaches and MRI of the brain was obtained. This showed 2 small wedge shaped foci in the left hemisphere  consistent with prior small ischemic strokes. She was started on aspirin baby size and Zetia. There was chronic microvascular ischemic change as expected for age, some cortical  atrophy no acute findings. At the time that she saw Dr. [name] she had neck pain for 7-8 months already. Voltaren gel helps a little bit pain on movement. She has migraines without aura that started already when she was a teenager. Tried topiramate in the past has failed amitriptyline, try Toradol, tried Imitrex , Cymbalta.  She has weaned off all pain medications.  CT of the soft tissue of the neck was also reviewed ; there is evidence of cervical disc and facet degeneration. An echocardiogram was ordered revealing a patent foramen ovale or she is scheduled for an occipital nerve block with Dr. Dr. Kittie Plater, he will do a second one next coming Friday. This has helped her neck pain. She has not seen her cardiologist.  [name] presents today on 10/23/2016. She has recently seen Dr. [name] again who has been successfully treating her head and neck pain. 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 The pain has improved. As to her CPAP use is 87% compliant with an average user time of 6 hours and 9 minutes, she is using an AutoSet between 7 and 15 cm water. Her 95th percentile pressure is 14.4 and straddles close to the maximum pressure allowed. A residual AHI is 7.2 and the residual apneas are obstructive in nature. I do think we should increase the pressure window by 2 cm water. She endorsed today the Epworth sleepiness score at only 4 points.  Her RLS is controlled on 2 nightly doses of medication, each dose giving relief for about 4 hours. She takes quinine for leg cramps. She has  No arryhthmia, has a loop recorder .  Recent mamogram was normal but her cardiologist discovered a mass under the 4 th rib. She is awaiting a CT chest now.    02-02-2018 SLEEP [facility] CLINIC-[name] is seen here today  in a regular revisit, but I have seen her last almost 14 months ago.  Her last visit at [facility] was with Dr. [name] whose note I quote below.  The patient has remained a compliant CPAP user for 25 out of the last 30 days, 5 hours 52 minutes on average use, AutoSet between 5 and 16 cm water pressure with 3 cm EPR residual AHI is 5.7 the vast majority seems to be obstructive in nature yet the 95th percentile pressure is only 14 cmH2O.  We will either have to raise the maximum pressure or reduce the expiratory pressure relief.  She does have high air leaks at times.  She feels extremely fatigued again- but endorsed only 36 points on the fatigue score and 5 points on the Epworth sleepiness score. She wonders if her iron is low and [name] has just yesterday obtained no labs of which are not previous.  She has had another IV iron infusion in 2018 and one 2 weeks ago.  In addition she reports that Dr. Tamala Julian, her cardiologist, would like her control of cholesterol to be much better and recommends the injectable epatha,  which cost her $500 a month and co-pay.  However it is probably the most effective treatment she can get.  All oral  Statin medications have caused myositis and myalgia.  DR Ahern's note. 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 She has significant pain, decreased ROM which is chronic for over one year. She gets regular massage. We have performed multiple trigger point injections with Depo-Medrol, lidocaine and Marcaine. She has tried baclofen, Cymbalta, oxycodone, Lyrica, gabapentin, Robaxin without  any relief. Patient would benefit from Physical therapy for stretching, strengthening, manual therapy and massage as well as dry needling of the right trapezius muscle and any other muscles o modalities as clinically warranted by physical therapy evaluation  Procedure note for Spasmodic torticollis:EMG: EMG guidance was used to inject muscles detailed below. Aseptic procedure was  performed and patient tolerated procedure. Procedure was performed by Dr. Myrla Halsted. Patient tolerated the procedure. Refractory to oral medications . Motrin / tylenol for injections site pain / soreness . REMS precautions handout given to patient  RTC - see instructions for details Wasted 200 Units. Dysport-500 units [id number] x 2 vials. Dysport-500unitsx2vial     Social history:  Retired, married , adult children. Daughter and mother were excessively daytime sleepy. Mother and daughter have RLS.     CPAP : machine issued on 2-08-17-15.  12-9 2020 CPAP compliance has been excellent at 77% the average use at time on days used is 5 hours 26 minutes.  The patient had uses an auto titration between 7 and 16 cmH2O with 3 cm EPR and has a residual AHI of 3.2.  95th percentile pressure is 14 cmH2O all residual apneas seem to be obstructive in nature.  I do think we could increase the maximum pressure by another centimeter or relief EPR to 2 cm to allow her getting enough airway pressure to overcome the obstructive events.  There were 3 minutes or 2% of the nights Cheyne-Stokes respirations noted.  This is not concerning.  She has a very good fit with her interface and very little air leak.   IMPRESSION:  2017 This MRI of the brain with and without contrast shows the following: 1.   Two small wedge-shaped foci in the left hemisphere consistent with prior small ischemic strokes.  As both involve the gray matter and juxtacortical white matter, consider an embolic etiology 2.   Chronic microvascular ischemic change elsewhere, typical extent for age. ( can be  Gastric bypass related )  3.    Cortical atrophy, more than expected for age. 4.   There is a normal enhancement pattern and there are no acute findings.   INTERPRETING PHYSICIAN:  Richard A. Felecia Shelling, MD, PhD Certified in  [place] by [place] [employer]   Review of Systems: Out of a complete 14 system review, the patient complains  of only the following symptoms, and all other reviewed systems are negative.  Restless legs she often wakes up with a headache and she suffers from at times severe crippling migraines. She has dizziness, optic migraine, scotoma in visiual field.  Leg cramps also interfere with her sleep the initiation of sleep as well as the sleep duration at night.  Epworth Sleepiness score 3.   How likely are you to doze in the following situations: 0 = not likely, 1 = slight chance, 2 = moderate chance, 3 = high chance  Sitting and Reading? Watching Television? Sitting inactive in a public place (theater or meeting)? Lying down in the afternoon when circumstances permit? Sitting and talking to someone? Sitting quietly after lunch without alcohol? In a car, while stopped for a few minutes in traffic? As a passenger in a car for an hour without a break?  Total = 3 on 11-23-2019   Fatigue severity score 9 from 24/ 63 - remarkable.  ,  depression score 3.   She lost a lot of weight, she is exercising and she weaned aff all narcotics in 2017.  RLS recurrent. Marland Kitchen  Social History   Socioeconomic History  . Marital status: Married    Spouse name: Pilar Jarvis  . Number of children: 3  . Years of education: college  . Highest education level: Not on file  Occupational History  . Occupation: Retired  Tobacco Use  . Smoking status: Never Smoker  . Smokeless tobacco: Never Used  Vaping Use  . Vaping Use: Never used  Substance and Sexual Activity  . Alcohol use: No    Alcohol/week: 0.0 standard drinks  . Drug use: No  . Sexual activity: Yes    Birth control/protection: Post-menopausal  Other Topics Concern  . Not on file  Social History Narrative   Lives at home w/ her husband   Caffeine 8-10 cups daily.  (coffee 1 cup am, unsw tea all day long).    Social Determinants of Health   Financial Resource Strain:   . Difficulty of Paying Living Expenses:   Food Insecurity:   . Worried About  Charity fundraiser in the Last Year:   . Arboriculturist in the Last Year:   Transportation Needs:   . Film/video editor (Medical):   Marland Kitchen Lack of Transportation (Non-Medical):   Physical Activity:   . Days of Exercise per Week:   . Minutes of Exercise per Session:   Stress:   . Feeling of Stress :   Social Connections:   . Frequency of Communication with Friends and Family:   . Frequency of Social Gatherings with Friends and Family:   . Attends Religious Services:   . Active Member of Clubs or Organizations:   . Attends Archivist Meetings:   Marland Kitchen Marital Status:   Intimate Partner Violence:   . Fear of Current or Ex-Partner:   . Emotionally Abused:   Marland Kitchen Physically Abused:   . Sexually Abused:     Family History  Problem Relation Age of Onset  . CAD Mother   . Hypertension Mother   . Neuropathy Mother   . Osteoarthritis Mother   . Heart disease Mother   . Breast cancer Maternal Grandmother   . CAD Paternal Grandfather   . Colon cancer Father     Past Medical History:  Diagnosis Date  . 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 Arthritis    generalized  . Asthma   . Atrophic vaginitis   . Back pain    DDD/stenosis  . Carotid stenosis    Carotid US 10/16: Plaque RICA (8-75%), normal LICA  . Depression    takes Cymbalta daily  . Diverticulosis    benign  . DJD (degenerative joint disease)   . Dyslipidemia   . Dysrhythmia   . Family history of GI bleeding   . GERD (gastroesophageal reflux disease)    takes Nexium daily  . Gestational diabetes   . H/O hiatal hernia    surgery for hernia  . Headache(784.0)    takes Imitrex daily as needed and Bisoprolol daily;last migraine was about 2wks ago  . History of bronchitis 1 yr ago  . History of shingles   . Insomnia    takes Ambien nightly  . Joint pain   . Joint swelling   . Leg cramps    takes Flexeril daily as needed  . 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 Nocturia   . Osteoporosis    . Peripheral neuropathy    takes Gabapentin daily  . Pneumonia 14yrs ago   hx of  .  Restless leg syndrome    takes Requip daily  . RLS (restless legs syndrome) 08/16/2015  . Sleep apnea    uses CPAP  . Tubular adenoma of colon   . Vertigo   . Vitamin D deficiency     Current Outpatient Medications  Medication Sig Dispense Refill  . allopurinol (ZYLOPRIM) 100 MG tablet TK 1 T PO QD FOR GOUT PREVENTION OR FLARE    . ASPIRIN 81 PO Take 81 mg by mouth at bedtime.    . baclofen (LIORESAL) 10 MG tablet TAKE 1 TABLET(10 MG) BY MOUTH TWICE DAILY 60 tablet 0  . bisoprolol (ZEBETA) 10 MG tablet Take 10 mg by mouth 2 (two) times daily.    . calcium carbonate (OS-CAL) 1250 (500 CA) MG chewable tablet Chew 1 tablet by mouth daily. Not sure of dosage    . diclofenac sodium (VOLTAREN) 1 % GEL Apply 4 g topically 4 (four) times daily. 100 g 11  . DULoxetine (CYMBALTA) 60 MG capsule Take 60 mg by mouth daily after supper.  11  . Erenumab-aooe (AIMOVIG) 140 MG/ML SOAJ Inject 140 mg into the skin every 30 (thirty) days. 1 pen 11  . esomeprazole (NEXIUM) 40 MG capsule Take 40 mg by mouth 2 (two) times daily before a meal.   6  . Evolocumab 140 MG/ML SOAJ Inject 1 mL into the skin every 14 (fourteen) days. 6 mL 3  . furosemide (LASIX) 20 MG tablet Take 40 mg by mouth daily as needed for fluid or edema.    Marland Kitchen HYDROcodone-acetaminophen (NORCO) 10-325 MG tablet Take 1 tablet by mouth every 8 (eight) hours as needed for moderate pain or severe pain. Takes for migraines    . Loratadine (CLARITIN) 10 MG CAPS Take 1 capsule by mouth as needed (for allergies).     . ondansetron (ZOFRAN-ODT) 4 MG disintegrating tablet DISSOLVE 1 TABLET(4 MG) ON THE TONGUE EVERY 8 HOURS AS NEEDED FOR NAUSEA OR VOMITING 45 tablet 11  . PROAIR HFA 108 (90 Base) MCG/ACT inhaler 2 INHALATIONS EVERY 4 HOURS AS NEEDED FOR ASTHMA SYMPTOMS (WHEEZING/SHORTNESS OF BREATH)  5  . quiNINE (QUALAQUIN) 324 MG capsule Take 1 capsule (324 mg total)  by mouth 2 (two) times daily. 180 capsule 3  . rOPINIRole (REQUIP) 1 MG tablet TAKE 1 TABLET(1 MG) BY MOUTH TWICE DAILY BEFORE LUNCH AND SUPPER 180 tablet 3  . Vitamin D, Ergocalciferol, (DRISDOL) 50000 units CAPS capsule Take 1 capsule (50,000 Units total) by mouth every 7 (seven) days. 30 capsule 3  . zolpidem (AMBIEN) 10 MG tablet     . amphetamine-dextroamphetamine (ADDERALL) 10 MG tablet Take 1 tablet (10 mg total) by mouth daily before breakfast. 30 tablet 0   No current facility-administered medications for this visit.    Allergies as of 07/31/2020  . (No Known Allergies)    Vitals: BP 128/76   Pulse 68   Ht 5\' 7"  (1.702 m)   Wt 232 lb (105.2 kg)   BMI 36.34 kg/m  Last Weight:  Wt Readings from Last 1 Encounters:  07/31/20 232 lb (105.2 kg)       Last Height:   Ht Readings from Last 1 Encounters:  07/31/20 5\' 7"  (1.702 m)    Physical exam:  General: The patient is awake, alert and appears not in acute distress. The patient is well groomed. Head: Normocephalic, atraumatic. Neck is now supple. No tenderness. Mallampati 3 ,  neck circumference: 15 inches. Nasal airflow unrestricted, TMJ is not evident. Retrognathia is  seen.  Cardiovascular:  Regular rate and rhythm, without  murmurs or carotid bruit, and without distended neck veins. Respiratory: Lungs are clear to auscultation. Skin:  Without evidence of edema, or rash, well healed knee replacement scars.  Trunk: BMI is elevated .  Neurologic exam :The patient is awake and alert, oriented to place and time.  Memory subjective described as intact.  There is a normal attention span & concentration ability. Speech is fluent without  dysarthria, dysphonia or aphasia. Mood and affect are appropriate. Cranial nerves: Pupils are equal and briskly reactive to light.  Extraocular movements  in vertical and horizontal planes intact ,without nystagmus.  Visual fields by finger perimetry are intact. Hearing to finger rub  intact. Facial motor strength is symmetric and tongue and uvula move midline.  Shoulder shrug intact . Motor exam:  Normal tone, no cog-wheeling, muscle bulk and symmetric strength in all extremities. No cog wheeling and no waxy or clasp knife response.  Sensory:  Fine touch, pinprick and vibration were tested in all extremities. Proprioception in both feet is affeced by pin and needle dysesthesias.  Coordination: Rapid alternating movements in the fingers/hands is normal. Finger-to-nose maneuver normal without evidence of ataxia, dysmetria or tremor. Gait and station: Patient walks without assistive device .  Strength within normal limits. Stance is stable and normal.  Deep tendon reflexes: in the  upper and lower extremities are symmetric. Babinski maneuver deferred.   Assessment:  After physical and neurologic examination, review of laboratory studies, imaging, neurophysiology testing and pre-existing records, assessment is   The patient was advised of the nature of the diagnosed sleep disorder , the treatment options and risks for general a health and wellness arising from not treating the condition. Visit duration was 40 minutes.  Of today's new patient visit-consultation 50% of our face-to-face time are spent in discussion of the medical conditions found the differential diagnosis the testing and treatment options for the conditions. The patient is willing to undergo a new iron infusion cycle .  Her cardiologists note's support this Pernell Dupre ,MD ) , Dr. Waymon Budge.   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  She is chronically iron deficient, she has been vitamin D deficient she has been vitamin B12 deficient. I have prescribed her a vitamin B12 nasal spray, vitamin D 50,000 units weekly pill, and I will obtain iron studies today to see which dose of an iron infusion would be indicated for her.  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 I would like for her to meet with a cardiologist to discuss the relevance to her MRI findings as well as to her clinical symptoms of headaches. The pain in the neck and headaches have both improved and I hope that she can continue Botox therapy through Dr. Elnita Maxwell as well as trigger point injections. Nerve blocks for neuralgia are also ordered. A download of her machine was not yet obtained but I reviewed her restless leg symptomatic and her RLS rating scale was endorsed at very low impediment there is no evidence at this time of major quality of life impairment based on restless leg intensity. She is not depressed. Is in the process of losing weight and she appears optimistic and motivated. I'm especially proud that she weaned herself off all narcotics.  Plan:  Treatment plan and additional workup :  1 RLS  with iron deficiency ) Dr. Beryle Beams  evaluated her discrepancy between very low ferritin levels  and apparently sufficient iron storage. She has maintained oral iron intake as well as multivitamins and multi minerals. Repeat ferritin levels, order iron IV. 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 She is not diabetic she has no documented thyroid disease and neither did her mother, nor has her daughter -but all of these women half severe painful dysesthesias, leg cramps, and restless legs.  She took quinine with good success, but had to order from [place].  The cost for the medications have exceeded what she can afford.   3) OSA- continue on CPAP, auto PAP . Doing well on the new machine ! Epworth sleepiness score was endorsed at 4 , and the fatigue severity questionnaire  at 13 points.    Rv in 12 month with me.   I thank Dr [name] for her headache expertise and  treatment.  The patient has been relieved of neck pain and occipital neuralgic pain and pain with radiculopathic distribution to the shoulders.    Asencion Partridge Shonika Kolasinski MD  07/31/2020

## 2020-07-31 NOTE — Patient Instructions (Signed)
Quinine tablets or capsules What is this medicine? QUININE (KWYE nine) is an antimalarial agent. It is used to treat malaria.  Quinine could be used to prevent leg cramps. This medicine may be used for other purposes; ask your health care provider or pharmacist if you have questions. COMMON BRAND NAME(S): Qualaquin What should I tell my health care provider before I take this medicine? They need to know if you have any of these conditions:  diabetes  glucose 6-phosphate dehydrogenase (G6PD) deficiency  heart problems, irregular heartbeats  kidney disease  liver disease  muscle weakness or disease  vision or hearing problems  an unusual or allergic reaction to quinine, quinidine, mefloquine, other medicines, foods, dyes, or preservatives  pregnant or trying to get pregnant  breast-feeding How should I use this medicine? Take this medicine by mouth with a glass of water. Follow the directions on the prescription label. Do not crush or chew. Take with food. Take your medicine at regular intervals. Do not take your medicine more often than directed. Do not skip doses or stop your medicine early even if you feel better. Do not stop taking except on your doctor's advice. A special MedGuide will be given to you by the pharmacist with each prescription and refill. Be sure to read this information carefully each time. Talk to your pediatrician regarding the use of this medicine in children. Special care may be needed. Overdosage: If you think you have taken too much of this medicine contact a poison control center or emergency room at once. NOTE: This medicine is only for you. Do not share this medicine with others. What if I miss a dose? If you miss a dose, take it as soon as you can. If it is almost time for your next dose, take only that dose. Do not take double or extra doses. What may interact with this medicine? Do not take this medicine with any of the following  medications:  cisapride  cholinesterase inhibitors like edrophonium, neostigmine, physostigmine, and pyridostigmine  erythromycin, troleandomycin  flecainide  other antimalarial drugs like mefloquine, halofantrine  pimozide  quinidine  rifampin  some medicines for irregular heart rhythm  some medicines used during surgery This medicine may also interact with the following medications:  acetazolamide  antacids  atorvastatin  cimetidine  desipramine  digoxin  heparin  ketoconazole  lovastatin  simvastatin  sodium bicarbonate  some antibiotics  some medicines to treat seizures  tonic water that contains quinine  warfarin This list may not describe all possible interactions. Give your health care provider a list of all the medicines, herbs, non-prescription drugs, or dietary supplements you use. Also tell them if you smoke, drink alcohol, or use illegal drugs. Some items may interact with your medicine. What should I watch for while using this medicine? Visit your doctor or health care professional for regular check ups. Let your doctor know if your symptoms do not improve or if you feel worse. Contact your doctor right away if your fevers come back after you finish this medicine. Some people may have low blood sugar while taking this medicine. Low blood sugar can make you feel lightheaded, dizzy, sweaty, confused, shaky, anxious, or weak. If you feel this way drink some fruit juice or have a snack then call your doctor. If you are diabetic, check your blood sugar as directed. Avoid antacids with aluminum or magnesium for 2 hours before and after taking a dose of this medicine. Tell your health care provider that you are  taking this medicine before you have any surgery, procedure, or dental work. What side effects may I notice from receiving this medicine? Side effects that you should report to your doctor or health care professional as soon as  possible:  allergic reactions like skin rash, itching or hives, swelling of the face, lips, or tongue  change in vision  difficulty breathing  fainting  fast or irregular heartbeat, chest pain  hearing loss or ringing  redness, blistering, peeling or loosening of the skin, including inside the mouth  seizure  unusual bleeding or bruising  unusual red or purple spots on the skin  unusually weak Side effects that usually do not require medical attention (report to your doctor or health care professional if they continue or are bothersome):  flushing  diarrhea  headache  stomach upset, vomiting  sweating This list may not describe all possible side effects. Call your doctor for medical advice about side effects. You may report side effects to FDA at 1-800-FDA-1088. Where should I keep my medicine? Keep out of the reach of children. Store at room temperature between 25 and 30 degrees C (77 and 86 degrees F). Keep in a tightly closed container. Throw away any unused medicine after the expiration date. NOTE: This sheet is a summary. It may not cover all possible information. If you have questions about this medicine, talk to your doctor, pharmacist, or health care provider.  2020 Elsevier/Gold Standard (2009-03-29 11:00:35)

## 2020-08-01 LAB — IRON AND TIBC
Iron Saturation: 25 % (ref 15–55)
Iron: 85 ug/dL (ref 27–139)
Total Iron Binding Capacity: 338 ug/dL (ref 250–450)
UIBC: 253 ug/dL (ref 118–369)

## 2020-08-01 LAB — FERRITIN: Ferritin: 32 ng/mL (ref 15–150)

## 2020-08-01 NOTE — Progress Notes (Signed)
Iron saturation is normal this time at 25%  ferritin is low at 32 ng/ml. If the patient has RLS symptoms at this time, we can offer iv iron. If the symptoms are not severe, we can repeat test in 3 month and offer iron if needed.

## 2020-08-21 ENCOUNTER — Ambulatory Visit: Payer: Medicare Other | Admitting: Interventional Cardiology

## 2020-08-22 ENCOUNTER — Encounter: Payer: Self-pay | Admitting: Neurology

## 2020-08-22 ENCOUNTER — Telehealth: Payer: Self-pay | Admitting: Neurology

## 2020-08-22 NOTE — Telephone Encounter (Signed)
-----   Message from Larey Seat, MD sent at 08/01/2020 11:36 AM EDT ----- Iron saturation is normal this time at 25%  ferritin is low at 32 ng/ml. If the patient has RLS symptoms at this time, we can offer iv iron. If the symptoms are not severe, we can repeat test in 3 month and offer iron if needed.

## 2020-08-22 NOTE — Telephone Encounter (Signed)
Hello, Mrs. Margraf would have to switch from Requip to Mirapex so she is currently taking ropinirole at 2 mg and that would equal 1 mg of Mirapex.  Please ask if she is willing to make this switch or if we should just give the iron first.

## 2020-08-22 NOTE — Telephone Encounter (Signed)
Called the patient and reviewed the results with her. Patient indicated that the RLS is still acting up and would like to move forward with iron infusion. Advised Venofer 100 mg IV will be placed and sent to the infusion suite for them to schedule. Patient should have the lab rechecked 1 week post infusion.  Patient asked if there is alternative medication other than requip that can try. She indicated that is only medication she has used for RLS. Advised I would discuss with Dr Brett Fairy and call back with her recommendation.

## 2020-08-22 NOTE — Telephone Encounter (Signed)
This encounter was created in error - please disregard.

## 2020-08-23 ENCOUNTER — Other Ambulatory Visit: Payer: Self-pay | Admitting: Neurology

## 2020-08-23 DIAGNOSIS — E538 Deficiency of other specified B group vitamins: Secondary | ICD-10-CM | POA: Diagnosis not present

## 2020-08-23 DIAGNOSIS — K146 Glossodynia: Secondary | ICD-10-CM | POA: Diagnosis not present

## 2020-08-23 MED ORDER — PRAMIPEXOLE DIHYDROCHLORIDE 1 MG PO TABS
1.0000 mg | ORAL_TABLET | Freq: Every evening | ORAL | 1 refills | Status: DC
Start: 2020-08-23 — End: 2020-08-23

## 2020-08-23 NOTE — Telephone Encounter (Signed)
Called the patient and reviewed with her the medication alternative. She is scheduled for her upcoming infusion and does want to make the switch to mirapex. Informed her script will be called into the pharmacy. Informed her that she will stop the requip and take mirapex in place. Pt verbalized understanding. Pt had no questions at this time but was encouraged to call back if questions arise. Patient also understands to come and have her labs rechecked 1 wk after infusion.

## 2020-08-23 NOTE — Addendum Note (Signed)
Addended by: Darleen Crocker on: 08/23/2020 10:23 AM   Modules accepted: Orders

## 2020-08-27 DIAGNOSIS — K902 Blind loop syndrome, not elsewhere classified: Secondary | ICD-10-CM | POA: Diagnosis not present

## 2020-08-27 DIAGNOSIS — D508 Other iron deficiency anemias: Secondary | ICD-10-CM | POA: Diagnosis not present

## 2020-08-27 DIAGNOSIS — D519 Vitamin B12 deficiency anemia, unspecified: Secondary | ICD-10-CM | POA: Diagnosis not present

## 2020-08-27 DIAGNOSIS — K5080 Crohn's disease of both small and large intestine without complications: Secondary | ICD-10-CM | POA: Diagnosis not present

## 2020-09-13 DIAGNOSIS — Z23 Encounter for immunization: Secondary | ICD-10-CM | POA: Diagnosis not present

## 2020-10-09 ENCOUNTER — Telehealth: Payer: Self-pay | Admitting: *Deleted

## 2020-10-09 NOTE — Telephone Encounter (Signed)
Completed [employer] on Cover My Meds. Key: [id number]. Approved immediately by [employer].   LOPRAF:[id number];UQXAFH:[id number];Review Type:Prior Auth;Coverage Start Date:09/09/2020;Coverage End Date:10/09/2021;

## 2020-10-10 NOTE — Telephone Encounter (Signed)
Received approval notice via fax from [place]. I faxed the approval notice to [employer] ([place] patient assistance) for their records. Received a receipt of confirmation.

## 2020-10-16 ENCOUNTER — Ambulatory Visit: Payer: Medicare Other | Admitting: Neurology

## 2020-10-18 ENCOUNTER — Telehealth: Payer: Self-pay | Admitting: Interventional Cardiology

## 2020-10-18 ENCOUNTER — Other Ambulatory Visit: Payer: Self-pay | Admitting: Pharmacist

## 2020-10-18 MED ORDER — REPATHA SURECLICK 140 MG/ML ~~LOC~~ SOAJ: 1.0000 "pen " | SUBCUTANEOUS | 11 refills | Status: DC

## 2020-10-18 NOTE — Telephone Encounter (Signed)
I did not need this encounter. °

## 2020-10-18 NOTE — Progress Notes (Signed)
1 month Repatha refill sent to pt's pharmacy in [place], [place] while she is out of town per her request.

## 2020-11-04 ENCOUNTER — Encounter (HOSPITAL_COMMUNITY): Payer: Self-pay | Admitting: *Deleted

## 2020-11-04 ENCOUNTER — Other Ambulatory Visit: Payer: Self-pay

## 2020-11-04 ENCOUNTER — Emergency Department (HOSPITAL_COMMUNITY): Payer: Medicare Other

## 2020-11-04 ENCOUNTER — Emergency Department (HOSPITAL_COMMUNITY)
Admission: EM | Admit: 2020-11-04 | Discharge: 2020-11-05 | Disposition: A | Discharge status: Home / Self Care | Payer: Medicare Other | Attending: Emergency Medicine | Admitting: Emergency Medicine

## 2020-11-04 DIAGNOSIS — S0990XA Unspecified injury of head, initial encounter: Secondary | ICD-10-CM | POA: Insufficient documentation

## 2020-11-04 DIAGNOSIS — Y92523 Highway rest stop as the place of occurrence of the external cause: Secondary | ICD-10-CM | POA: Insufficient documentation

## 2020-11-04 DIAGNOSIS — S42251A Displaced fracture of greater tuberosity of right humerus, initial encounter for closed fracture: Secondary | ICD-10-CM | POA: Diagnosis not present

## 2020-11-04 DIAGNOSIS — Z7982 Long term (current) use of aspirin: Secondary | ICD-10-CM | POA: Diagnosis not present

## 2020-11-04 DIAGNOSIS — M25562 Pain in left knee: Secondary | ICD-10-CM | POA: Insufficient documentation

## 2020-11-04 DIAGNOSIS — M25551 Pain in right hip: Secondary | ICD-10-CM | POA: Insufficient documentation

## 2020-11-04 DIAGNOSIS — W541XXA Struck by dog, initial encounter: Secondary | ICD-10-CM | POA: Insufficient documentation

## 2020-11-04 DIAGNOSIS — S4991XA Unspecified injury of right shoulder and upper arm, initial encounter: Secondary | ICD-10-CM | POA: Diagnosis present

## 2020-11-04 DIAGNOSIS — J45909 Unspecified asthma, uncomplicated: Secondary | ICD-10-CM | POA: Diagnosis not present

## 2020-11-04 DIAGNOSIS — M545 Low back pain, unspecified: Secondary | ICD-10-CM | POA: Diagnosis not present

## 2020-11-04 DIAGNOSIS — Z79899 Other long term (current) drug therapy: Secondary | ICD-10-CM | POA: Insufficient documentation

## 2020-11-04 DIAGNOSIS — S43014A Anterior dislocation of right humerus, initial encounter: Secondary | ICD-10-CM | POA: Insufficient documentation

## 2020-11-04 DIAGNOSIS — Z043 Encounter for examination and observation following other accident: Secondary | ICD-10-CM | POA: Diagnosis not present

## 2020-11-04 DIAGNOSIS — I2729 Other secondary pulmonary hypertension: Secondary | ICD-10-CM | POA: Diagnosis not present

## 2020-11-04 DIAGNOSIS — Z96651 Presence of right artificial knee joint: Secondary | ICD-10-CM | POA: Diagnosis not present

## 2020-11-04 DIAGNOSIS — M25561 Pain in right knee: Secondary | ICD-10-CM | POA: Diagnosis not present

## 2020-11-04 DIAGNOSIS — M542 Cervicalgia: Secondary | ICD-10-CM

## 2020-11-04 DIAGNOSIS — M549 Dorsalgia, unspecified: Secondary | ICD-10-CM

## 2020-11-04 DIAGNOSIS — M25552 Pain in left hip: Secondary | ICD-10-CM | POA: Diagnosis not present

## 2020-11-04 DIAGNOSIS — S4291XA Fracture of right shoulder girdle, part unspecified, initial encounter for closed fracture: Secondary | ICD-10-CM

## 2020-11-04 DIAGNOSIS — S199XXA Unspecified injury of neck, initial encounter: Secondary | ICD-10-CM | POA: Diagnosis not present

## 2020-11-04 DIAGNOSIS — S43004A Unspecified dislocation of right shoulder joint, initial encounter: Secondary | ICD-10-CM | POA: Diagnosis not present

## 2020-11-04 DIAGNOSIS — W19XXXA Unspecified fall, initial encounter: Secondary | ICD-10-CM

## 2020-11-04 DIAGNOSIS — M19011 Primary osteoarthritis, right shoulder: Secondary | ICD-10-CM | POA: Diagnosis not present

## 2020-11-04 IMAGING — DX DG LUMBAR SPINE 2-3V
3 series · 3 of 3 positions shown · non-contrast
Comparison: CT [DATE]

CLINICAL DATA: Back pain

EXAM:
LUMBAR SPINE - 2-3 VIEW

[l-spine ap]
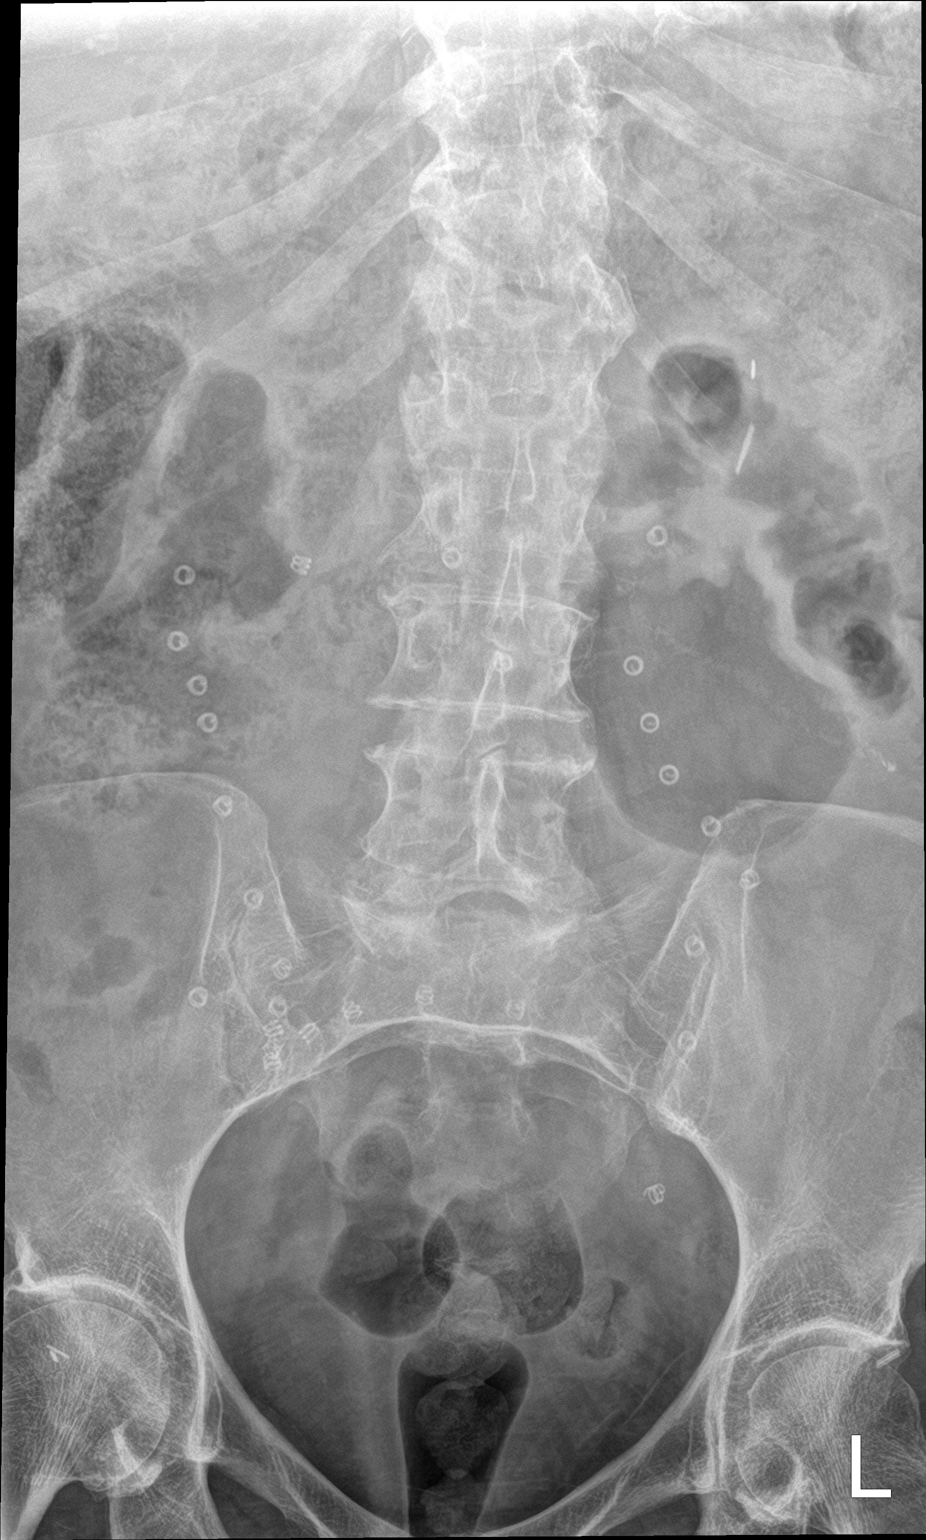

[l-spine spot]
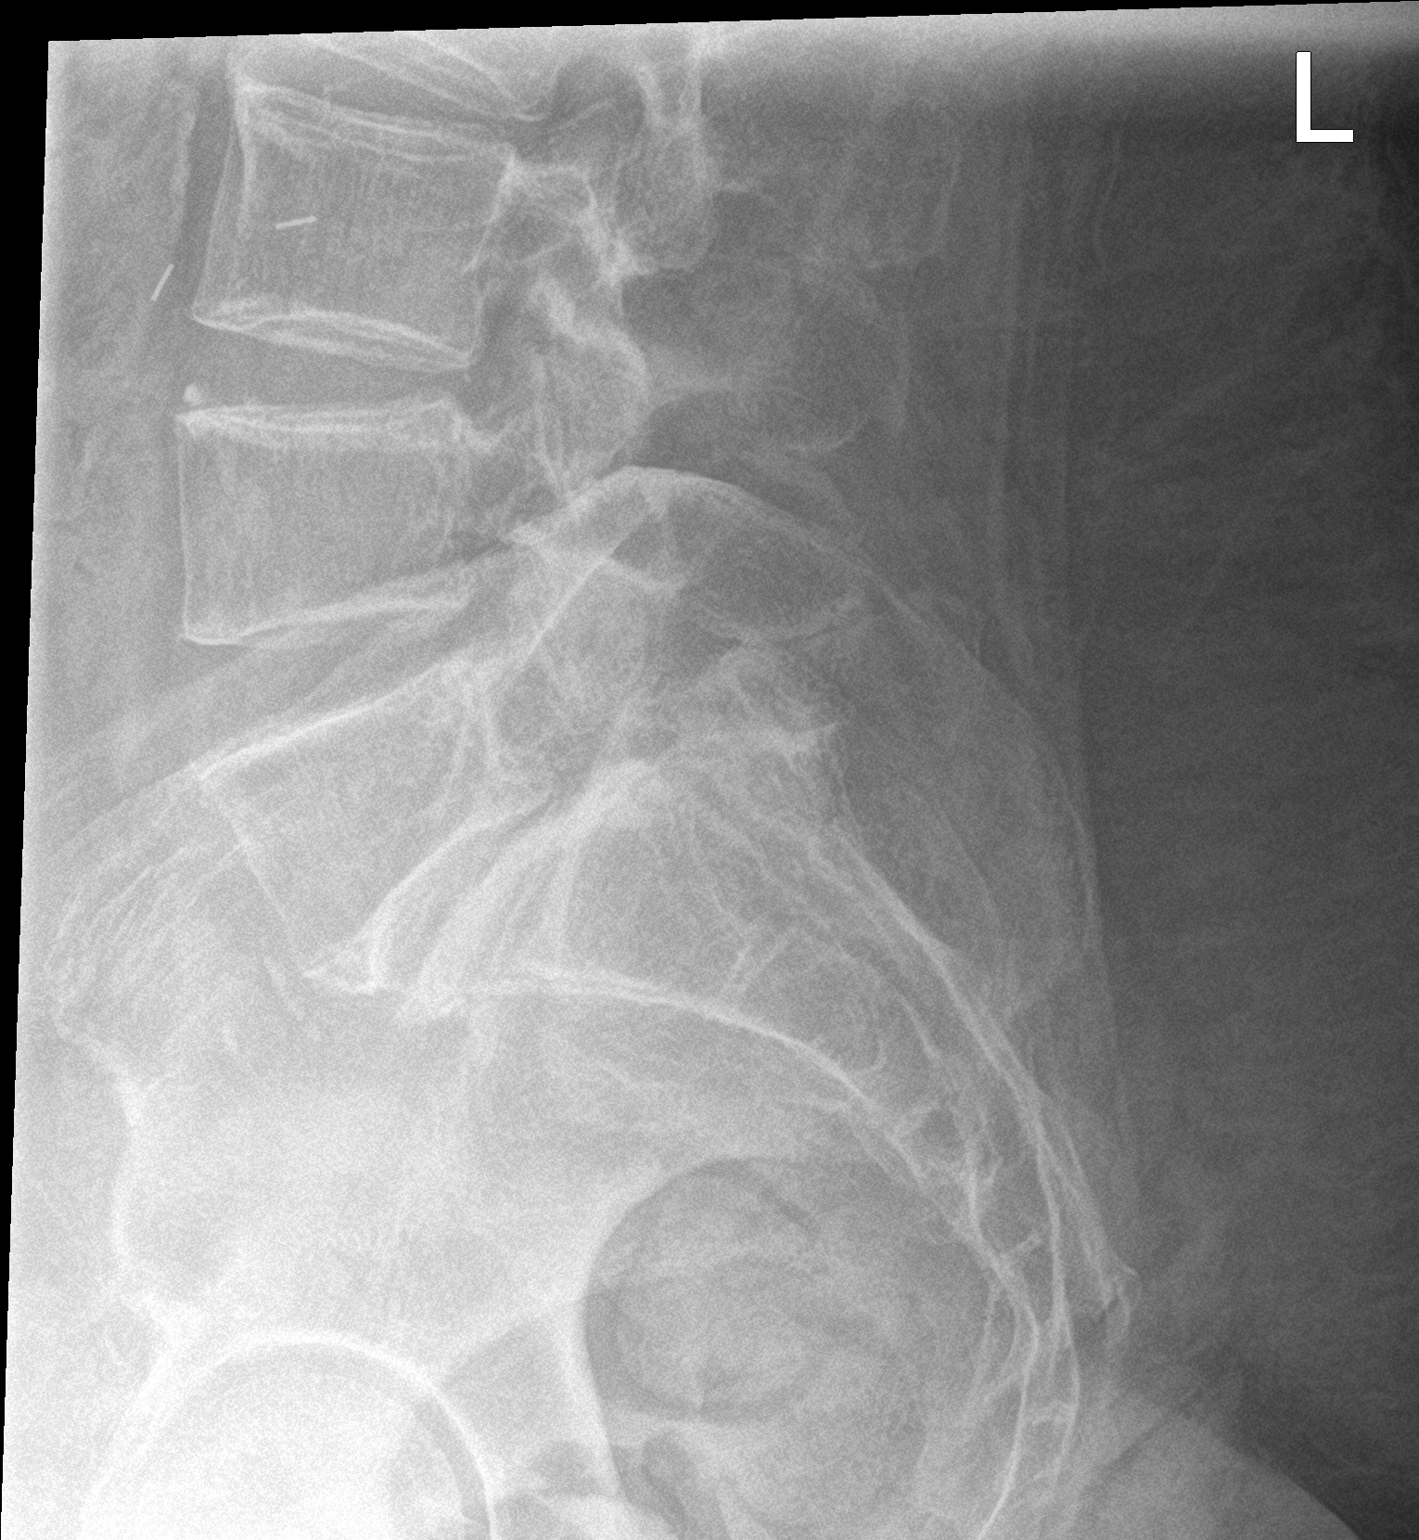

[l-spine lat]
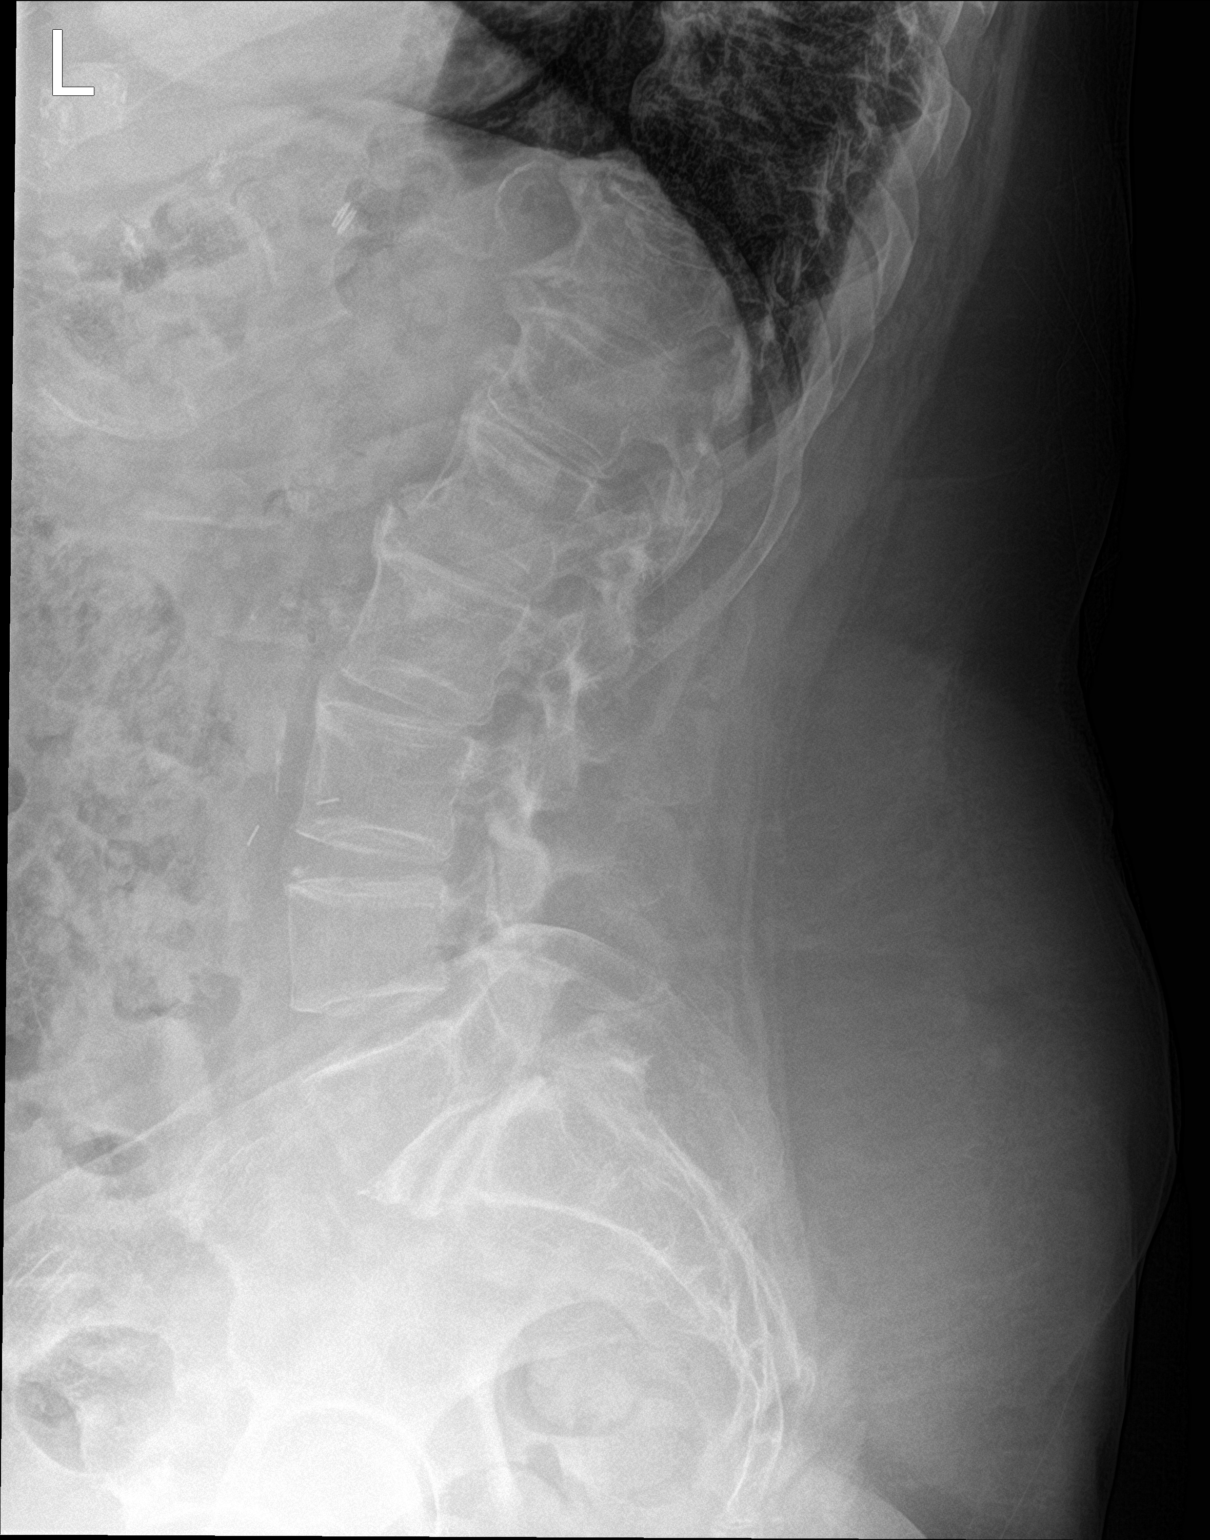

[3 of 3 positions shown; findings below may reference images not displayed]

FINDINGS: There is no evidence of lumbar spine fracture. There is a grade 1
anterolisthesis of L5 on S1 due to chronic bilateral pars defects.
Mild disc height loss with anterior osteophytes are seen throughout
the lumbar spine. Scattered vascular calcifications are noted.
IMPRESSION: No definite acute fracture.

## 2020-11-04 IMAGING — DX DG KNEE COMPLETE 4+V*R*
4 series · 4 of 4 positions shown · non-contrast
Comparison: [DATE]

CLINICAL DATA: Neck, back, hip, and knee pain after a fall.

EXAM:
RIGHT KNEE - COMPLETE 4+ VIEW

[knee ap]
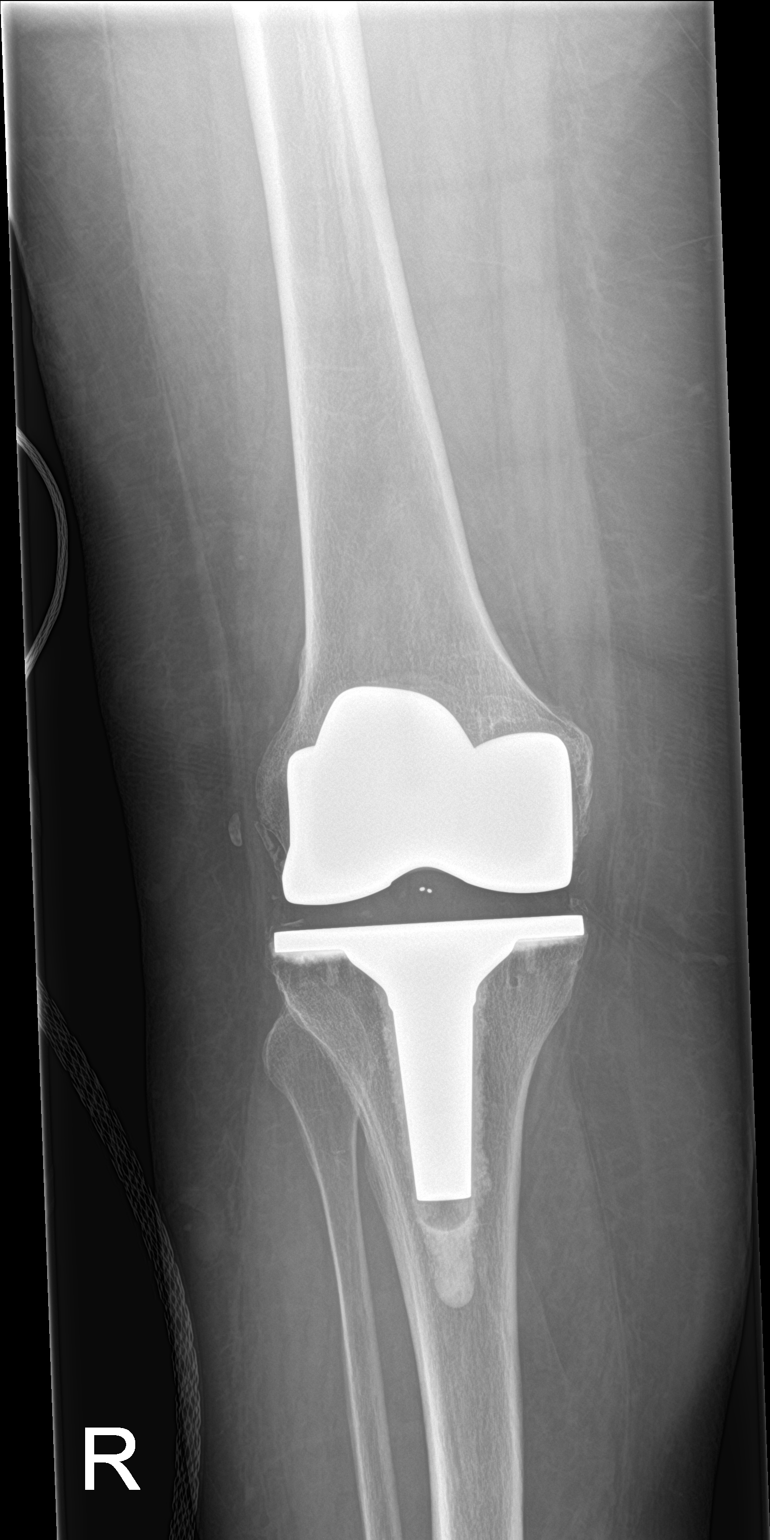

[knee lat]
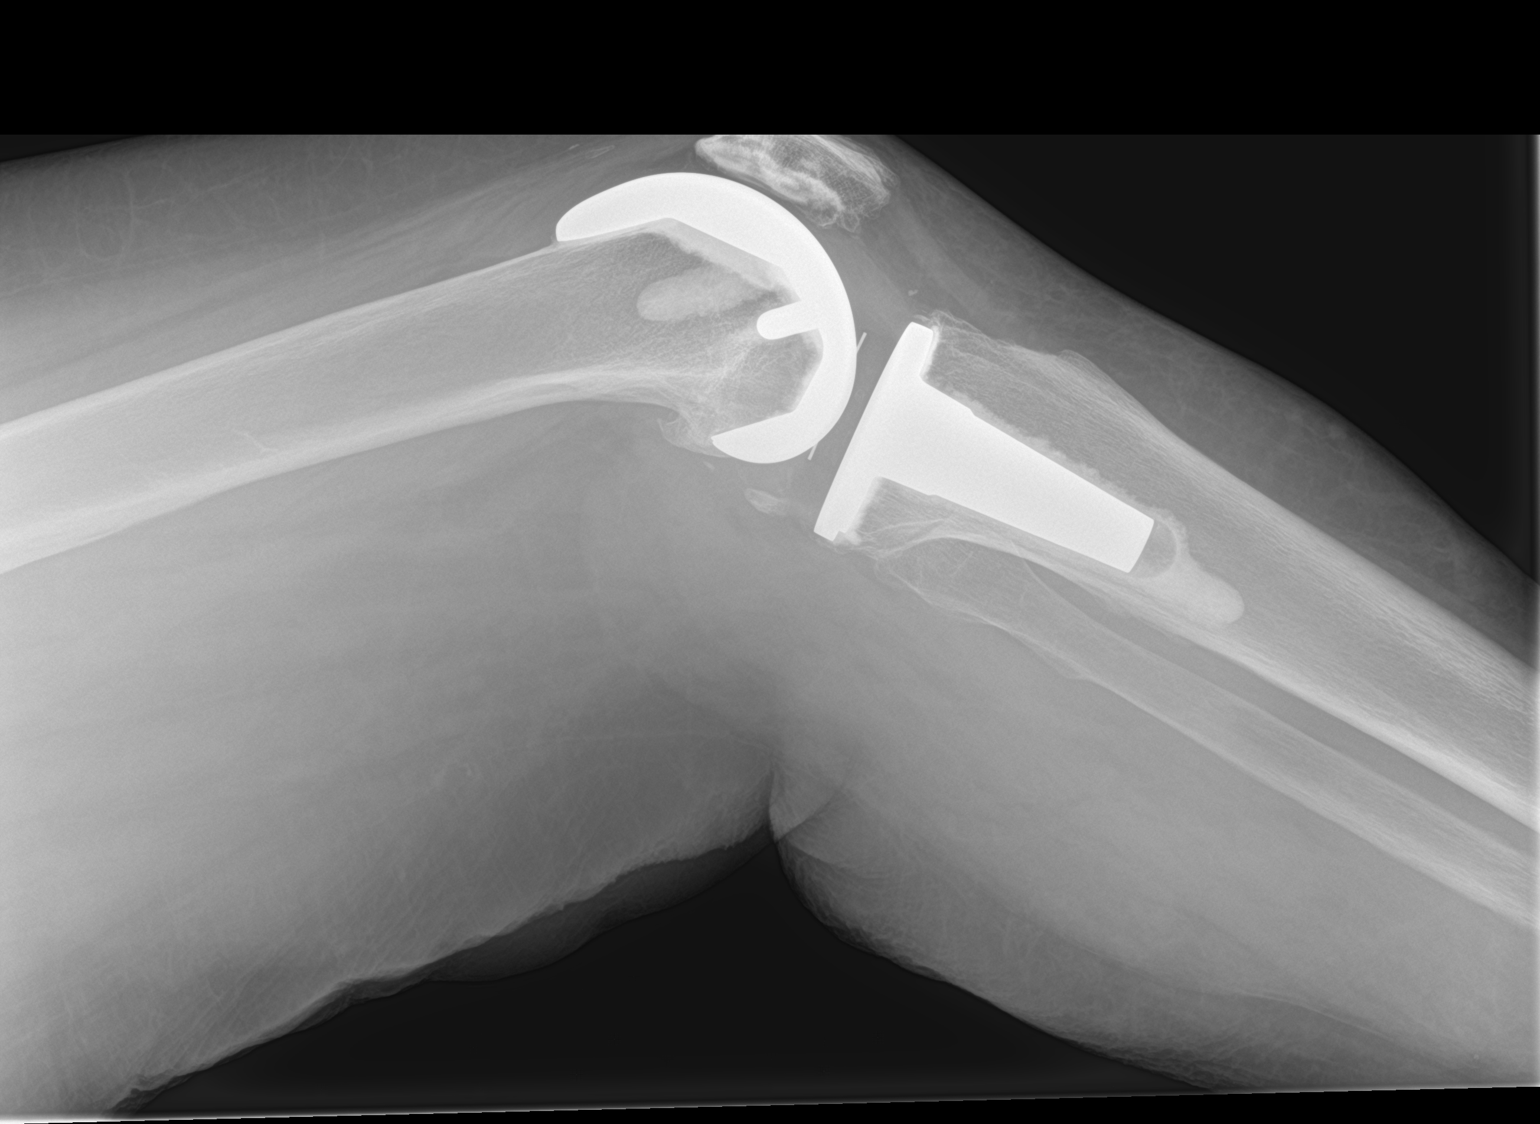

[knee obl (1 of 2)]
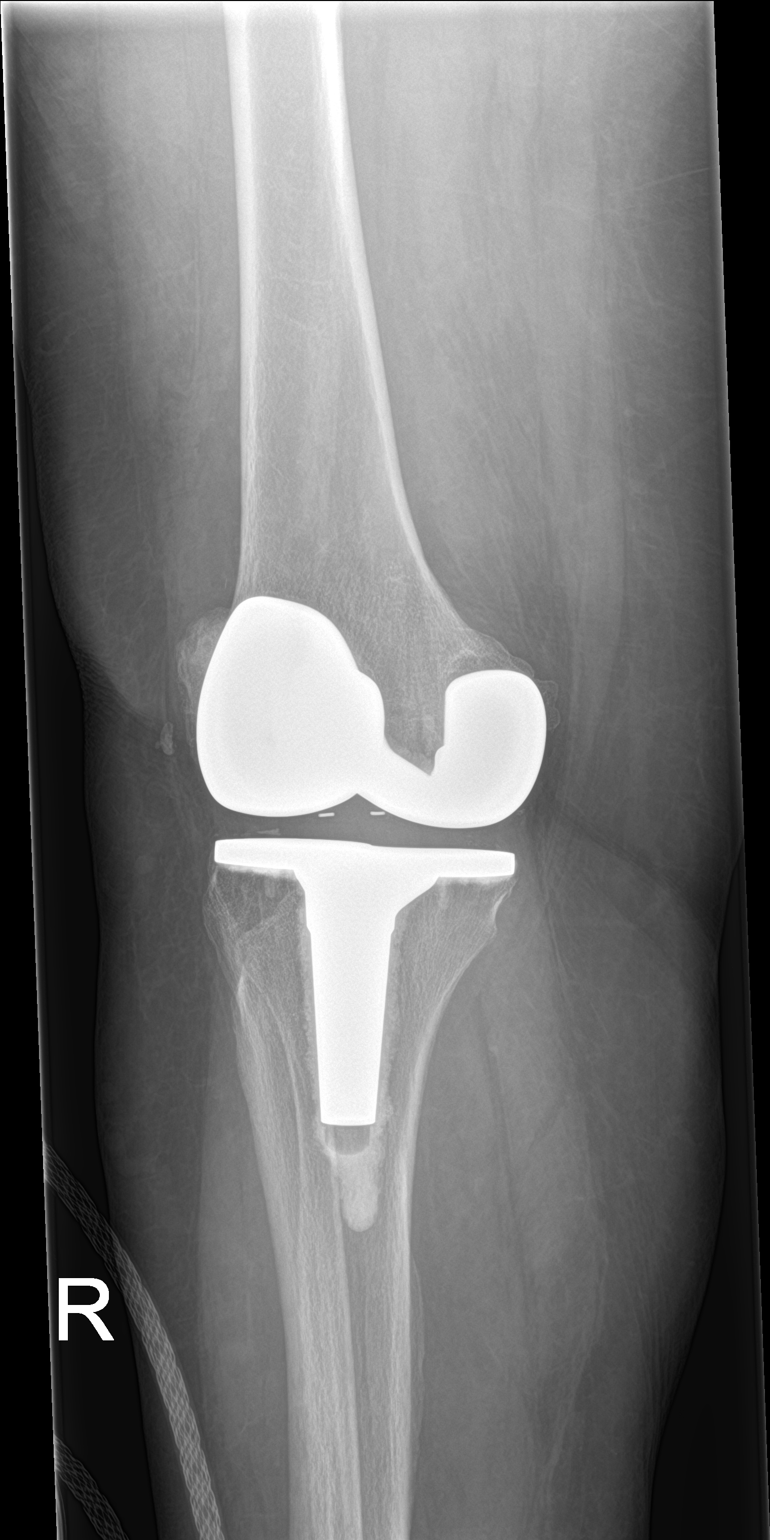

[knee obl (2 of 2)]
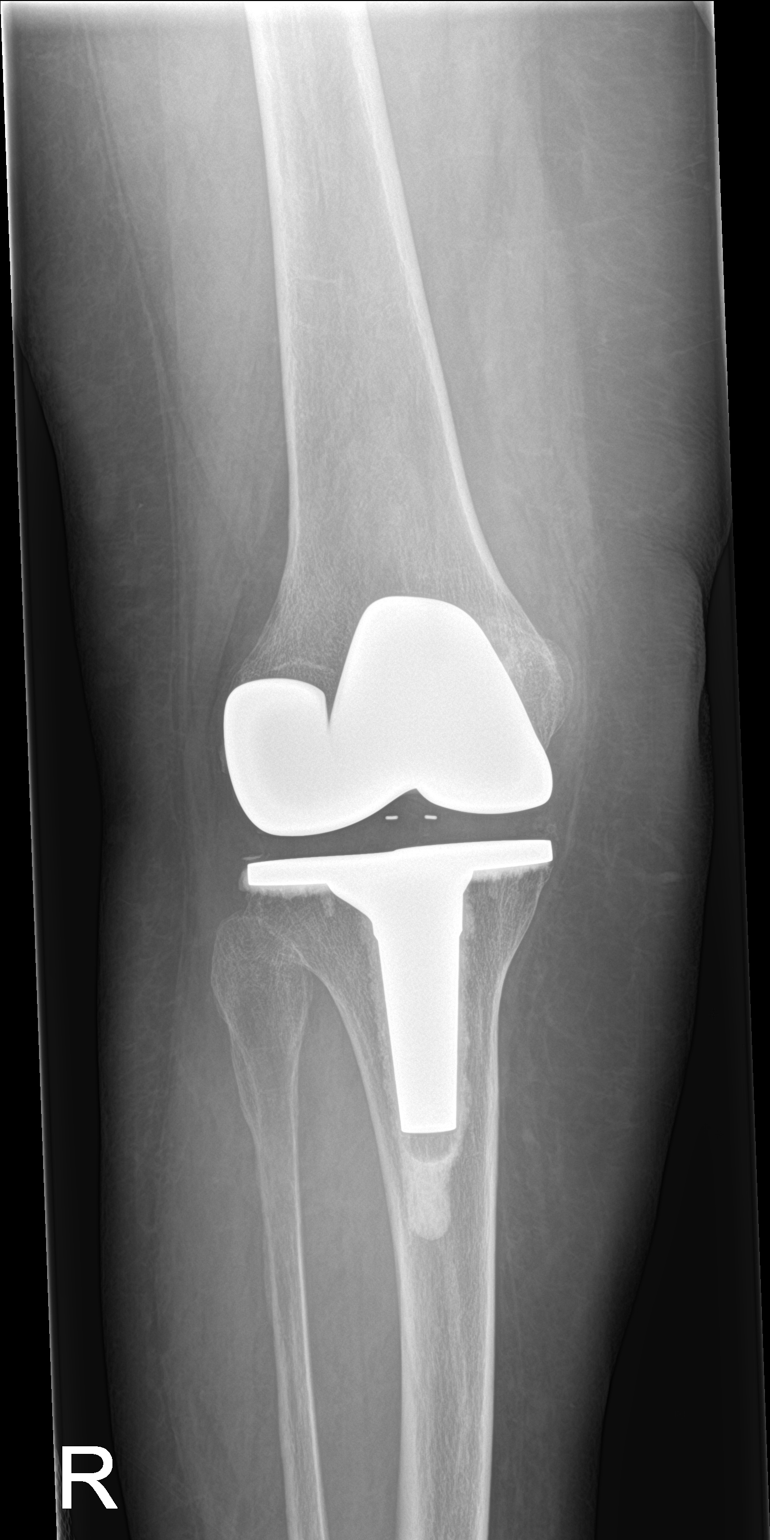

[4 of 4 positions shown; findings below may reference images not displayed]

FINDINGS: Right total knee arthroplasty with cemented components. There is a
patella femoral component. Components appear well seated. No
evidence of acute fracture or dislocation. No focal bone lesions or
bone destruction. Mild callus formation demonstrated around the
proximal fibular neck likely representing old deformity. No
significant effusions. Soft tissues are unremarkable.
IMPRESSION: Right total knee arthroplasty. Components appear well seated. No
acute bony abnormalities.

## 2020-11-04 IMAGING — CT CT HEAD W/O CM
4 series · 16 of 47 positions shown, 18 images · non-contrast
Comparison: [DATE]

CLINICAL DATA: Fall, trauma

EXAM:
CT HEAD WITHOUT CONTRAST
TECHNIQUE: Contiguous axial images were obtained from the base of the skull
through the vertex without intravenous contrast.

[Series 3: head without · axial · non-contrast · 0.41mm/px · z∈[-150,-15]mm · 7 of 37 slices shown, 9 images]
[im 5/37  brain]
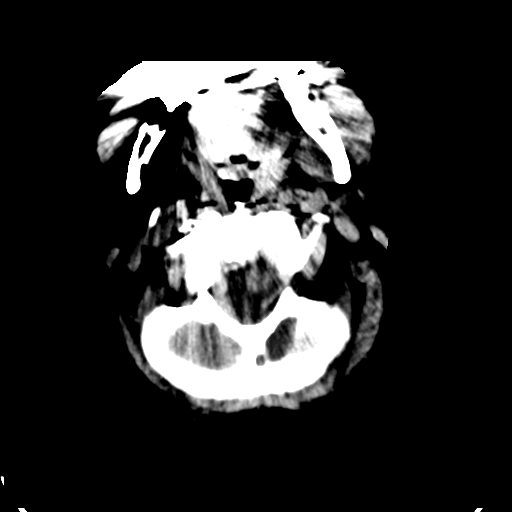
[im 5/37  bone]
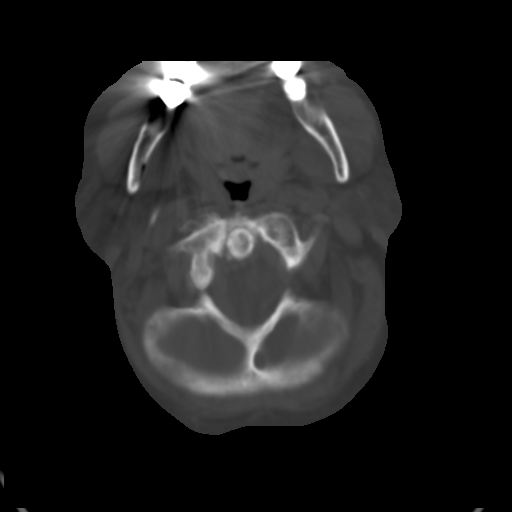
[im 10/37  brain]
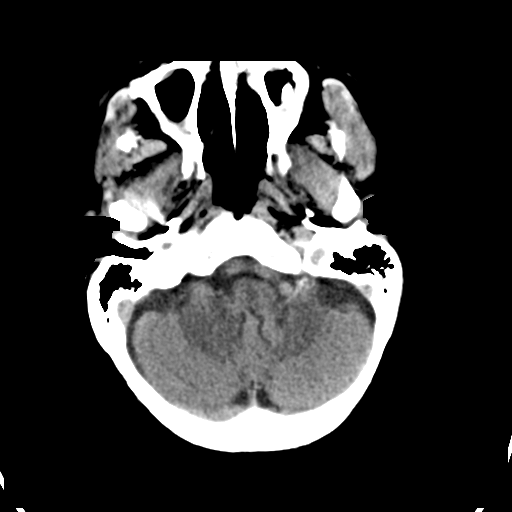
[im 14/37  brain]
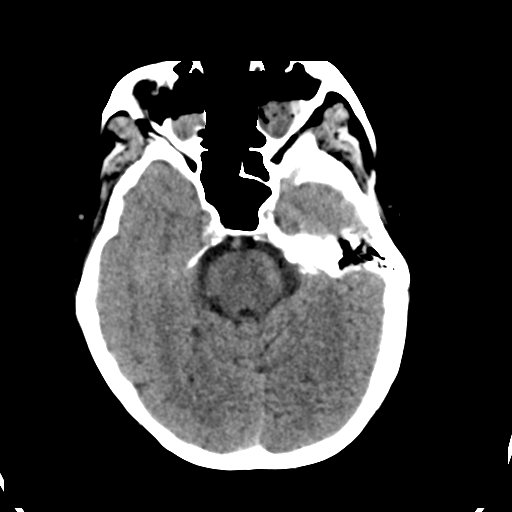
[im 19/37  brain]
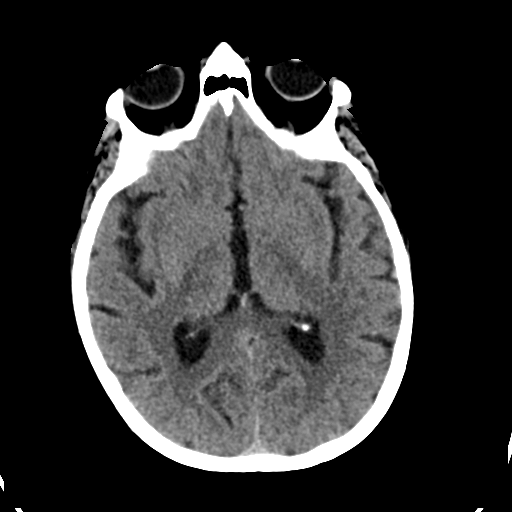
[im 23/37  brain]
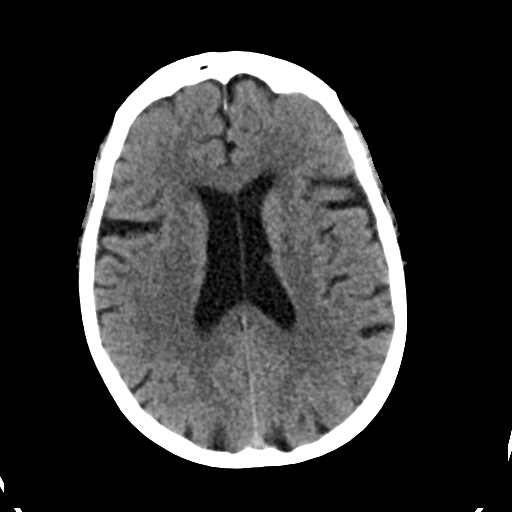
[im 23/37  bone]
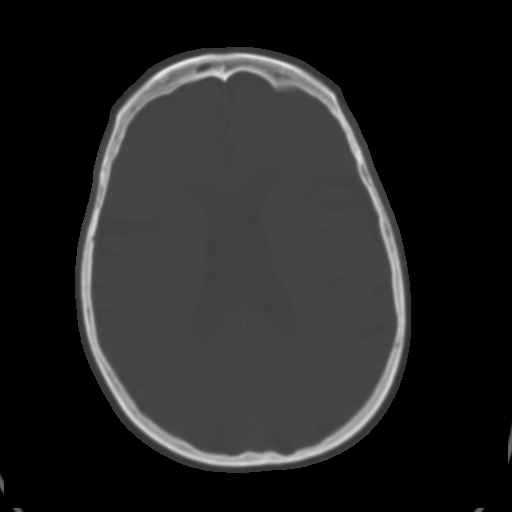
[im 28/37  brain]
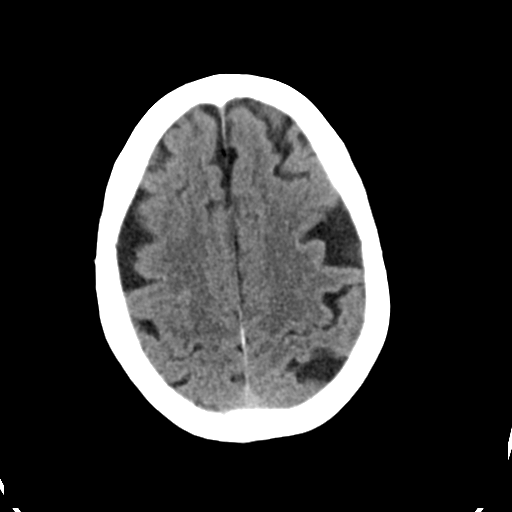
[im 32/37  brain]
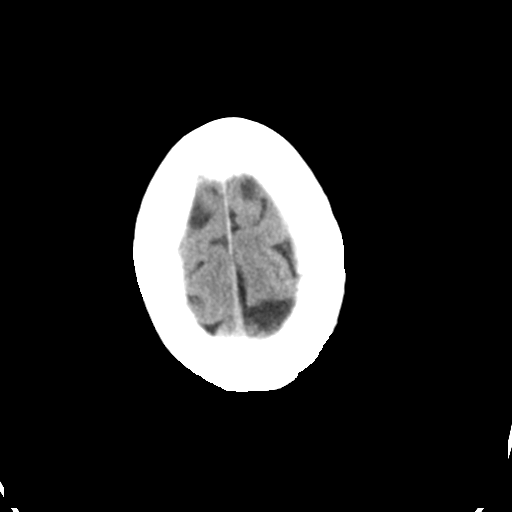

[Series 4: head bone · axial · 0.41mm/px · z∈[-152,-116]mm · 3 of 93 slices shown]
[im 10/93  bone]
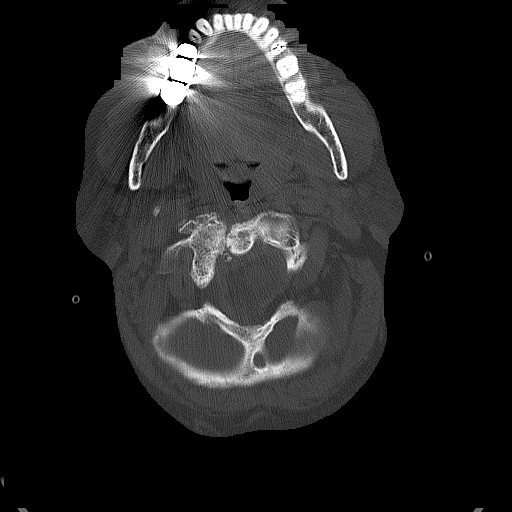
[im 19/93  bone]
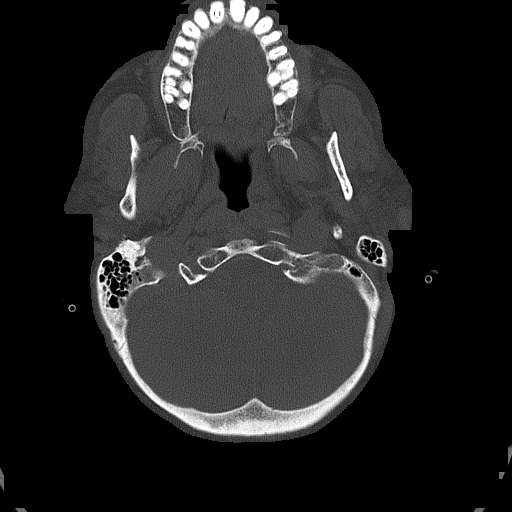
[im 28/93  bone]
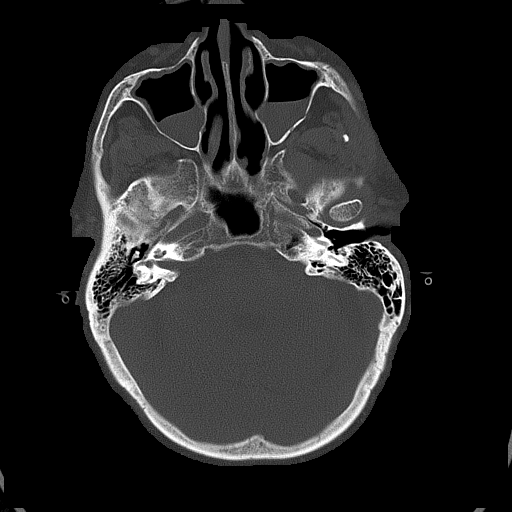

[Series 5: head without cor · coronal · non-contrast · 0.31mm/px · 3 of 69 slices shown]
[im 23/69  brain]
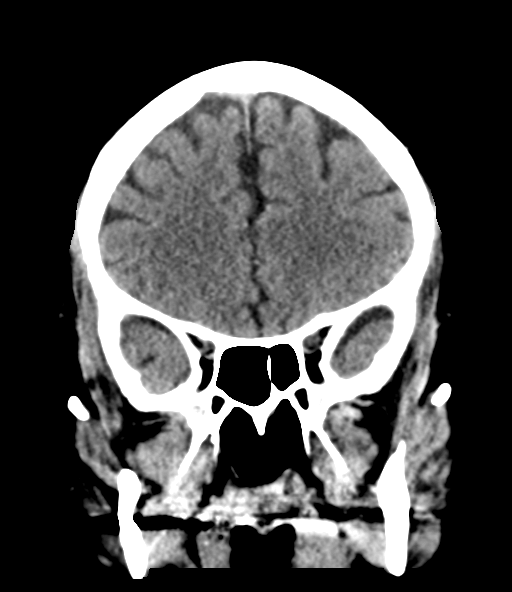
[im 31/69  brain]
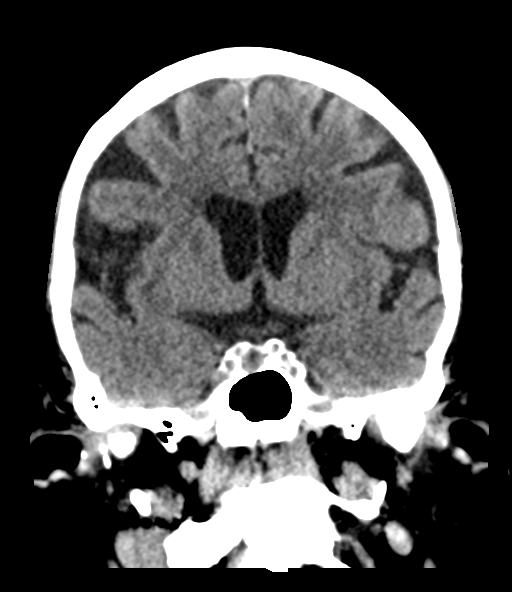
[im 38/69  brain]
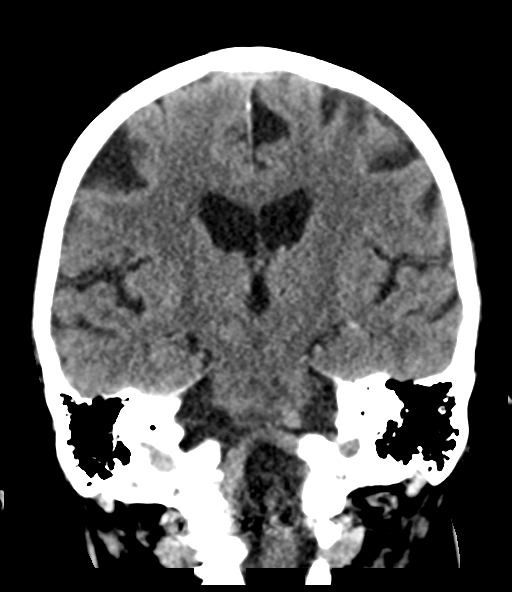

[Series 6: head without sag · sagittal · non-contrast · 0.36mm/px · 3 of 56 slices shown]
[im 19/56  brain]
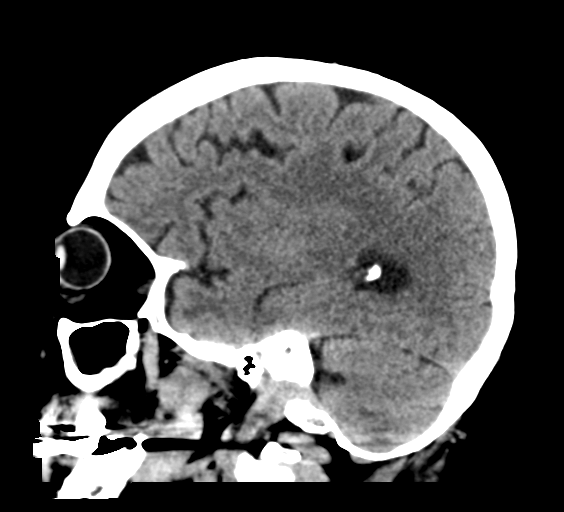
[im 28/56  brain]
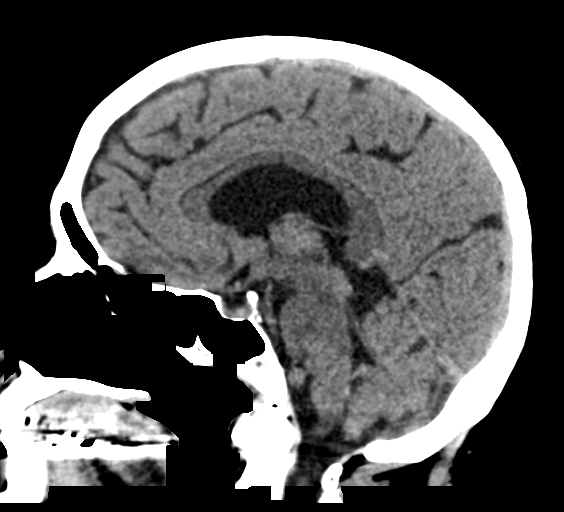
[im 37/56  brain]
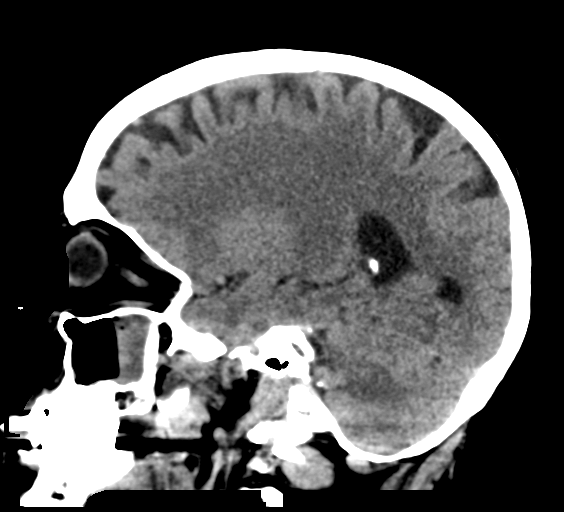

[16 of 47 positions shown; findings below may reference images not displayed]

FINDINGS: Brain: No evidence of acute territorial infarction, hemorrhage,
hydrocephalus,extra-axial collection or mass lesion/mass effect.
There is dilatation the ventricles and sulci consistent with
age-related atrophy. Low-attenuation changes in the deep white
matter consistent with small vessel ischemia.

Vascular: No hyperdense vessel or unexpected calcification.

Skull: The skull is intact.  No definite fracture seen.

Sinuses/Orbits: Mucosal thickening and fluid is seen within the
bilateral maxillary sinuses. The orbits and globes intact.

Other: None

Cervical spine:

Alignment: Physiologic

Skull base and vertebrae: Visualized skull base is intact. No
atlanto-occipital dissociation. The vertebral body heights are well
maintained. No fracture or pathologic osseous lesion seen.

Soft tissues and spinal canal: The visualized paraspinal soft
tissues are unremarkable. No prevertebral soft tissue swelling is
seen. The spinal canal is grossly unremarkable, no large epidural
collection or significant canal narrowing.

Disc levels: cervical spine spondylosis is seen with disc osteophyte
complex and uncovertebral osteophytes most notable at C3-C4 with
moderate to severe bilateral neural foraminal narrowing.

Upper chest: The lung apices are clear. Thoracic inlet is within
normal limits.

Other: None
IMPRESSION: No acute intracranial abnormality.

Findings consistent with age related atrophy and chronic small
vessel ischemia

Bilateral maxillary sinusitis

No acute fracture or malalignment of the spine.

## 2020-11-04 IMAGING — DX DG HIP (WITH OR WITHOUT PELVIS) 2-3V*R*
3 series · 3 of 3 positions shown · non-contrast
Comparison: [DATE]

CLINICAL DATA: Pain after a fall

EXAM:
DG HIP (WITH OR WITHOUT PELVIS) 2-3V RIGHT

[pelvis ap]
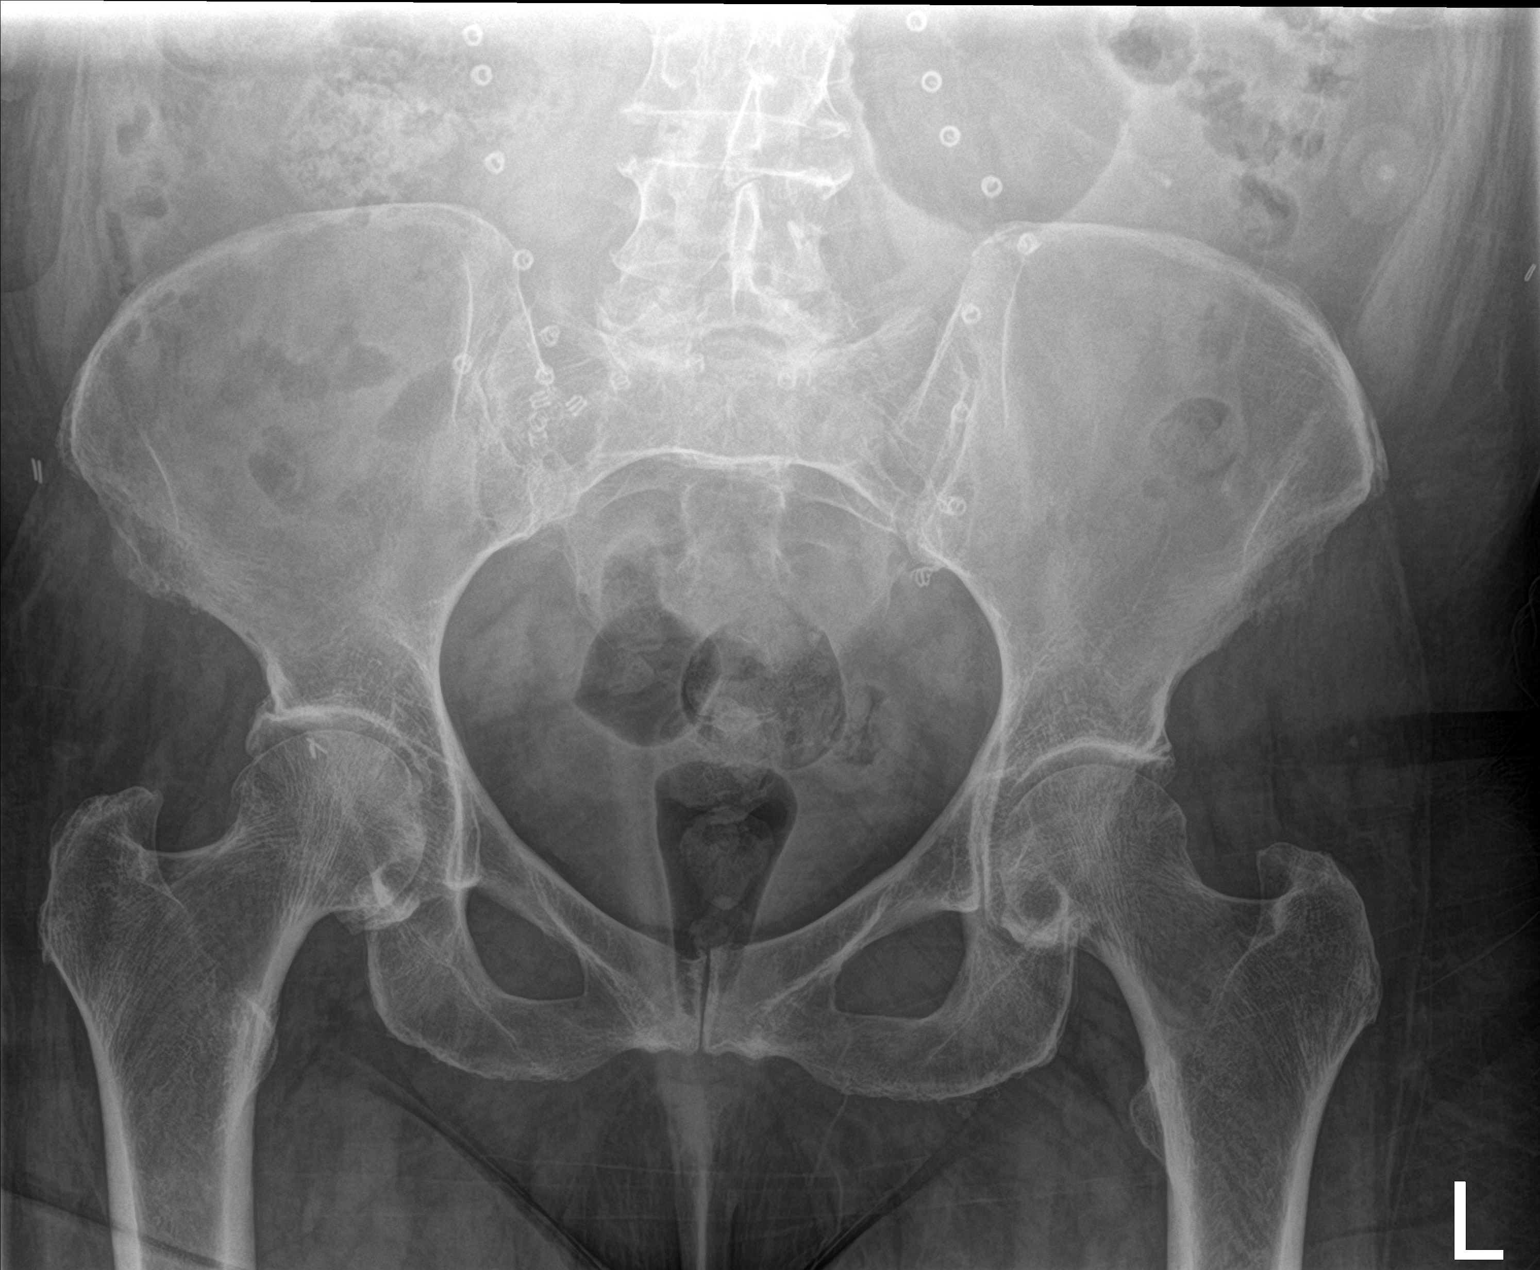

[hip ap]
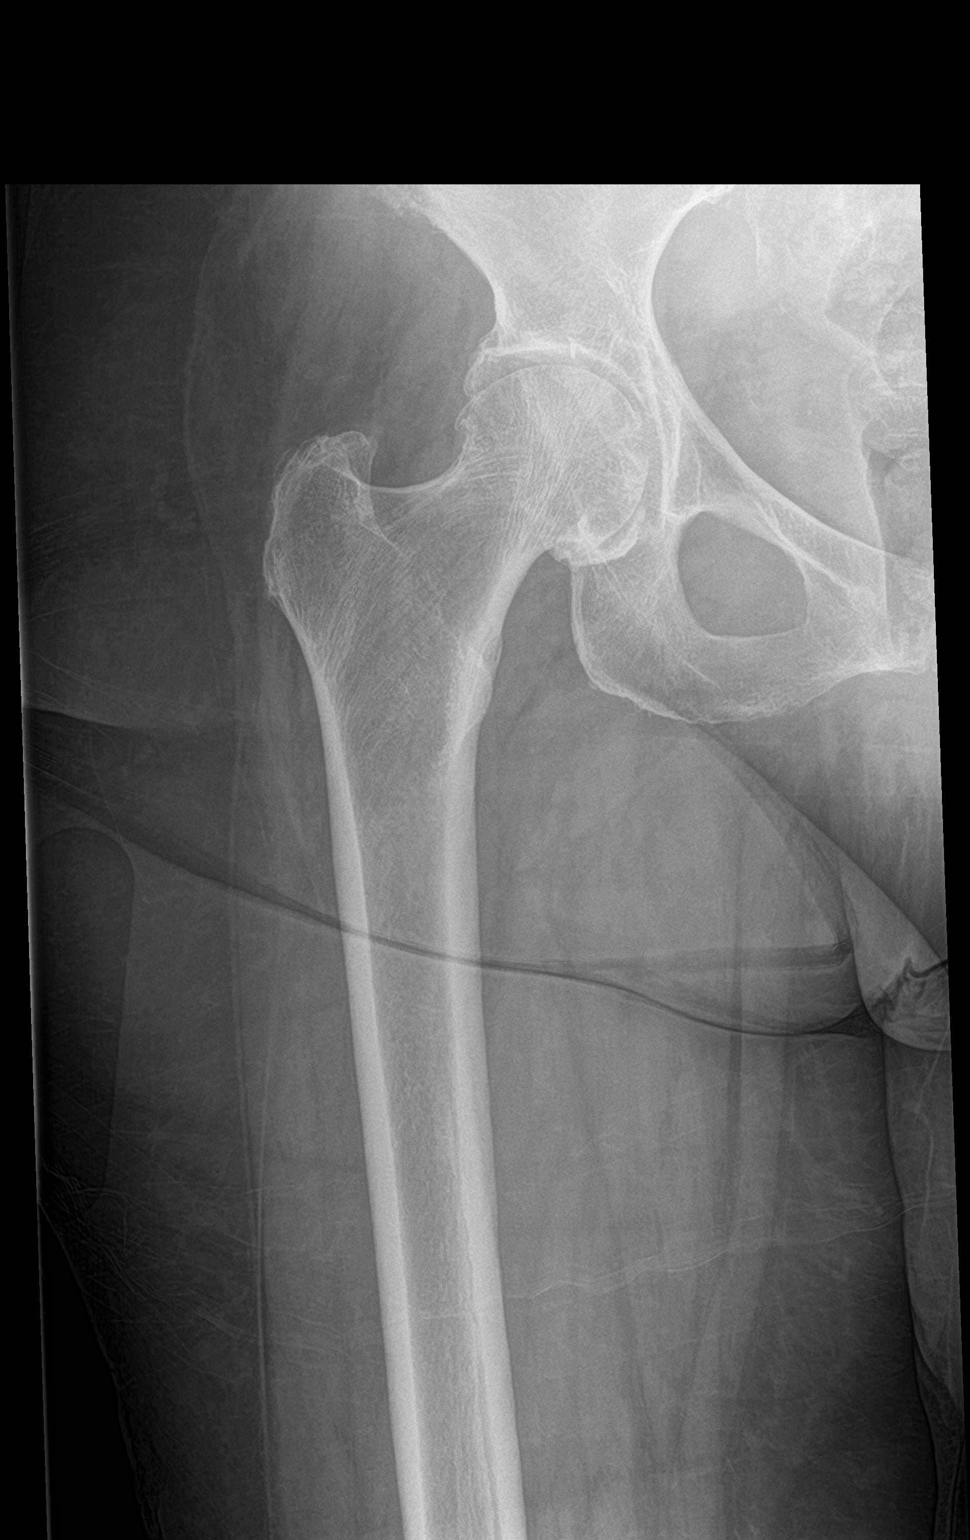

[hip lat]
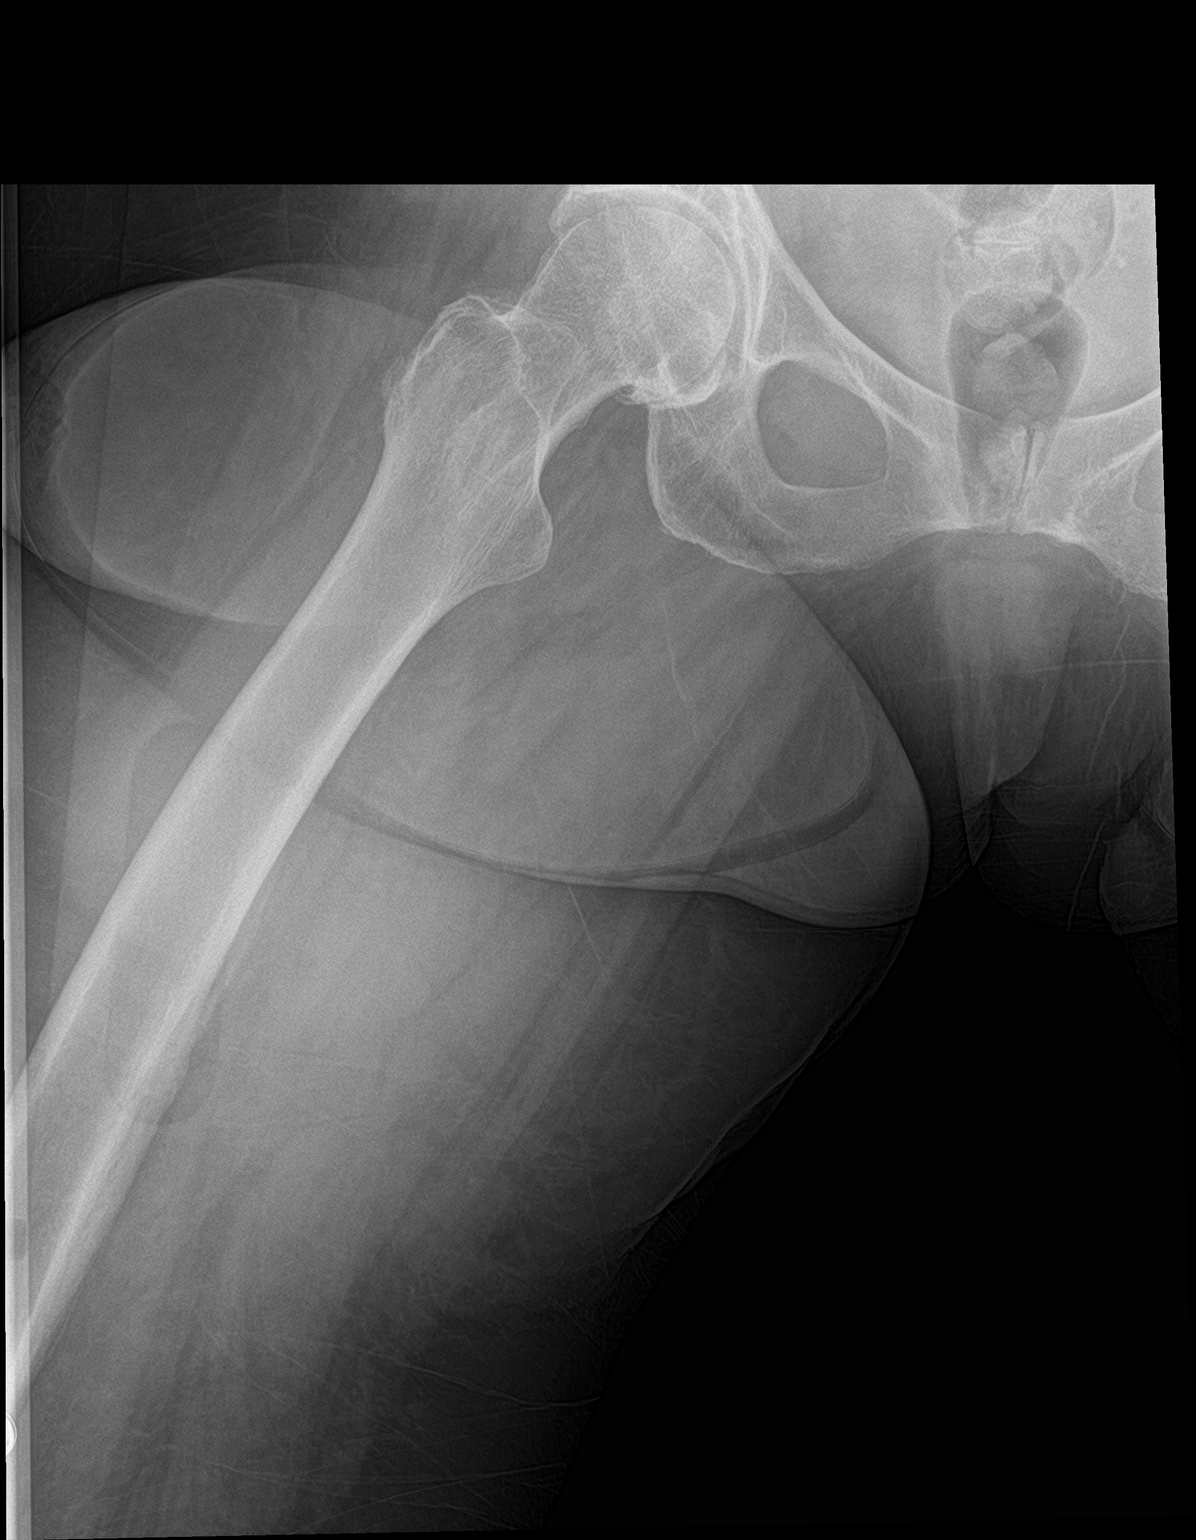

[3 of 3 positions shown; findings below may reference images not displayed]

FINDINGS: Degenerative changes in the lower lumbar spine and both hips. No
evidence of acute fracture or dislocation involving the pelvis or
right hip. No focal bone lesion or bone destruction. Soft tissues
are unremarkable. Surgical clips demonstrated in the abdomen.
IMPRESSION: Degenerative changes in the lower lumbar spine and hips. No acute
fracture or dislocation.

## 2020-11-04 IMAGING — DX DG THORACIC SPINE 2V
3 series · 3 of 3 positions shown · non-contrast
Comparison: None.

CLINICAL DATA: Fall

EXAM:
THORACIC SPINE 2 VIEWS

[t-spine ap]
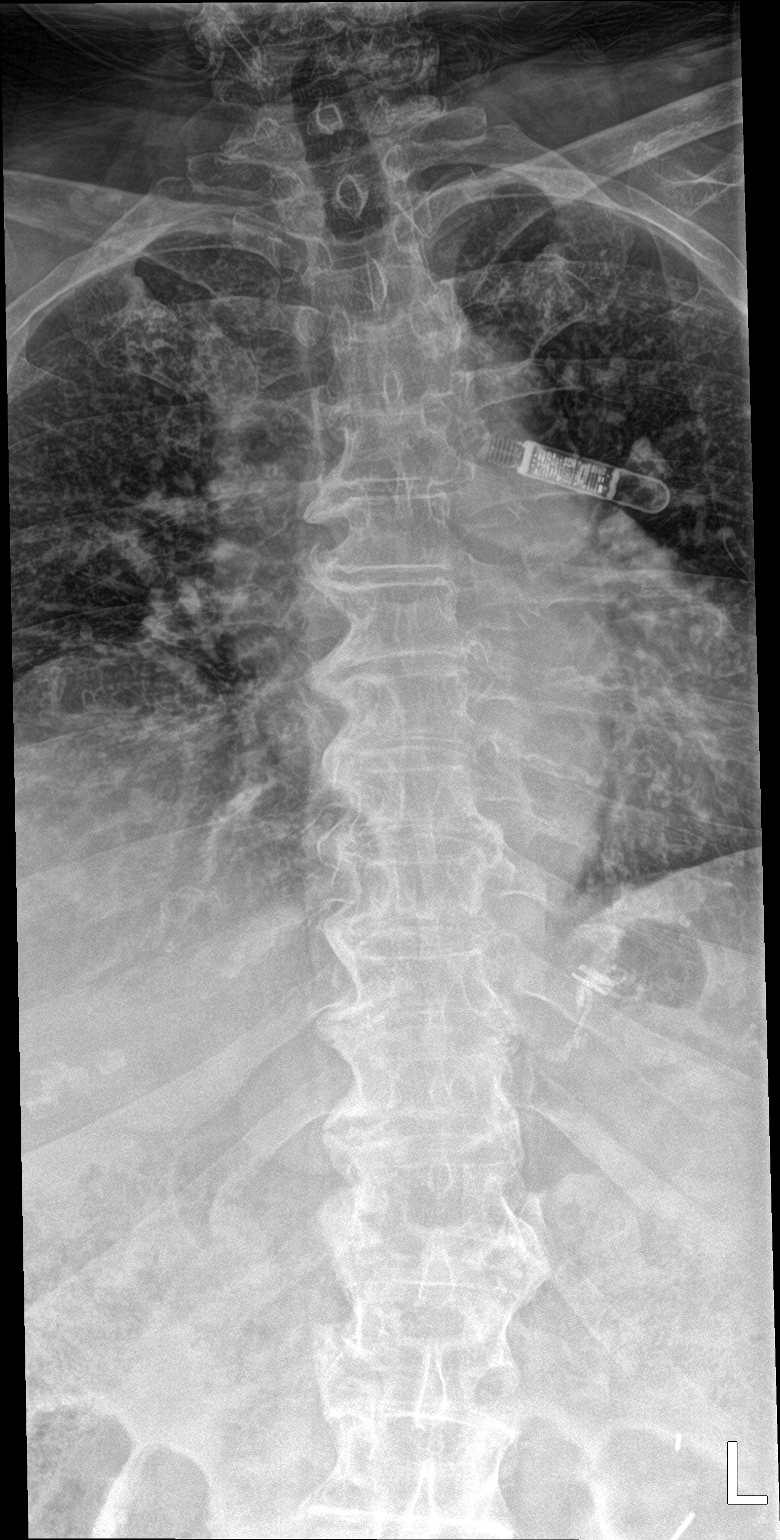

[t-spine lat]
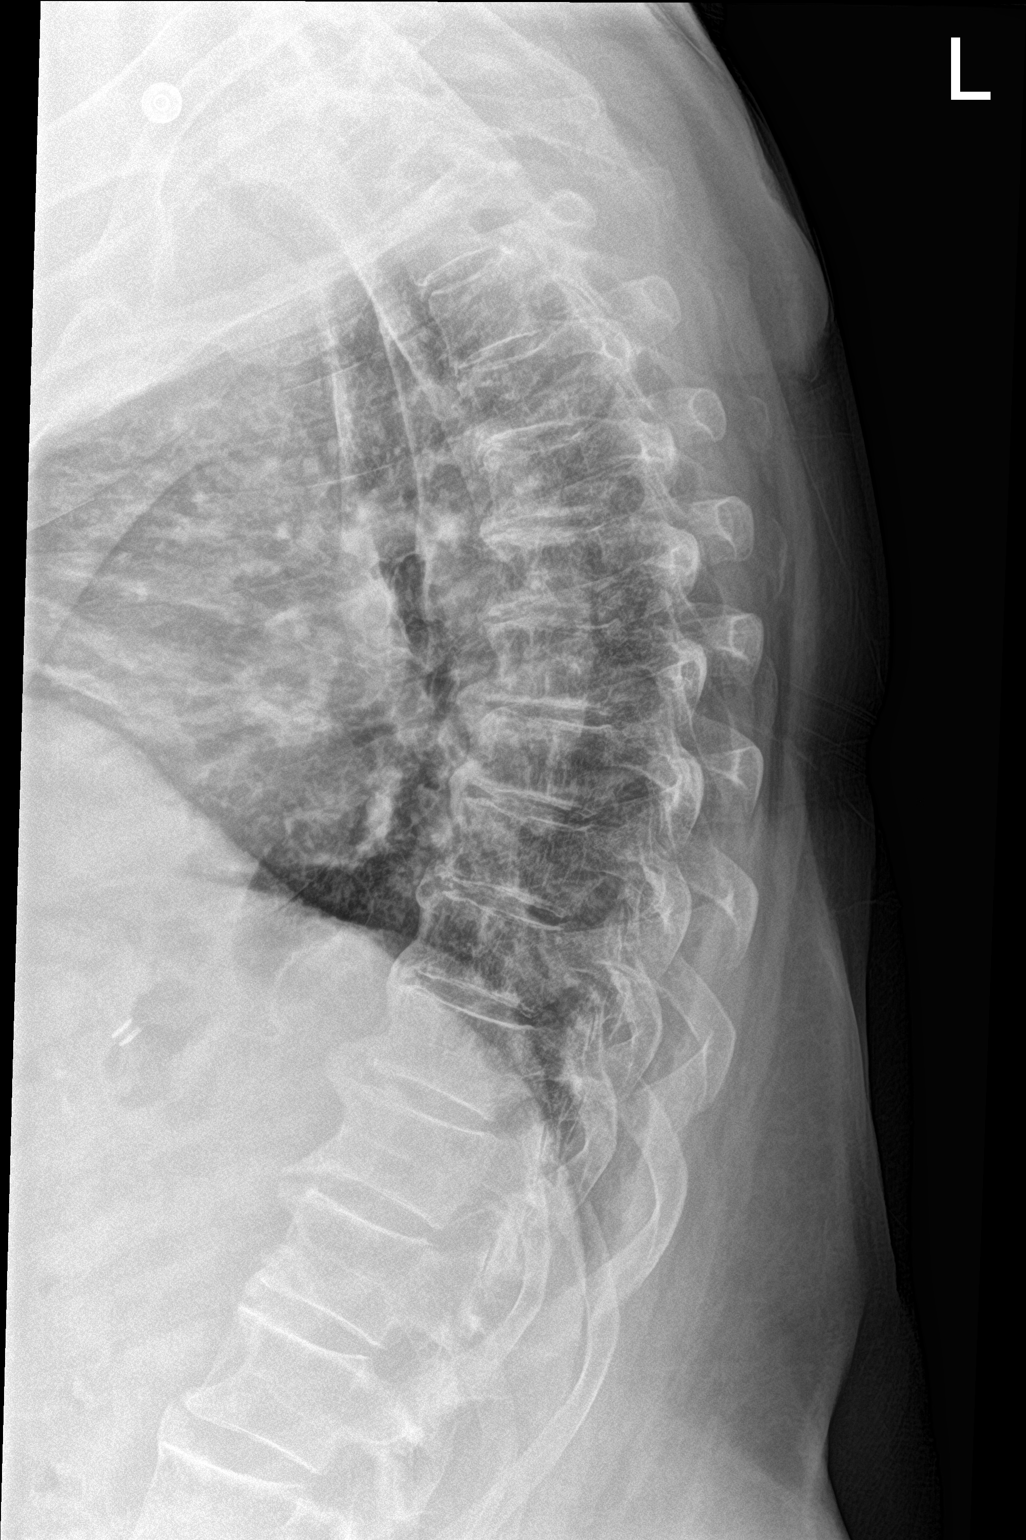

[t-spine swimmers]
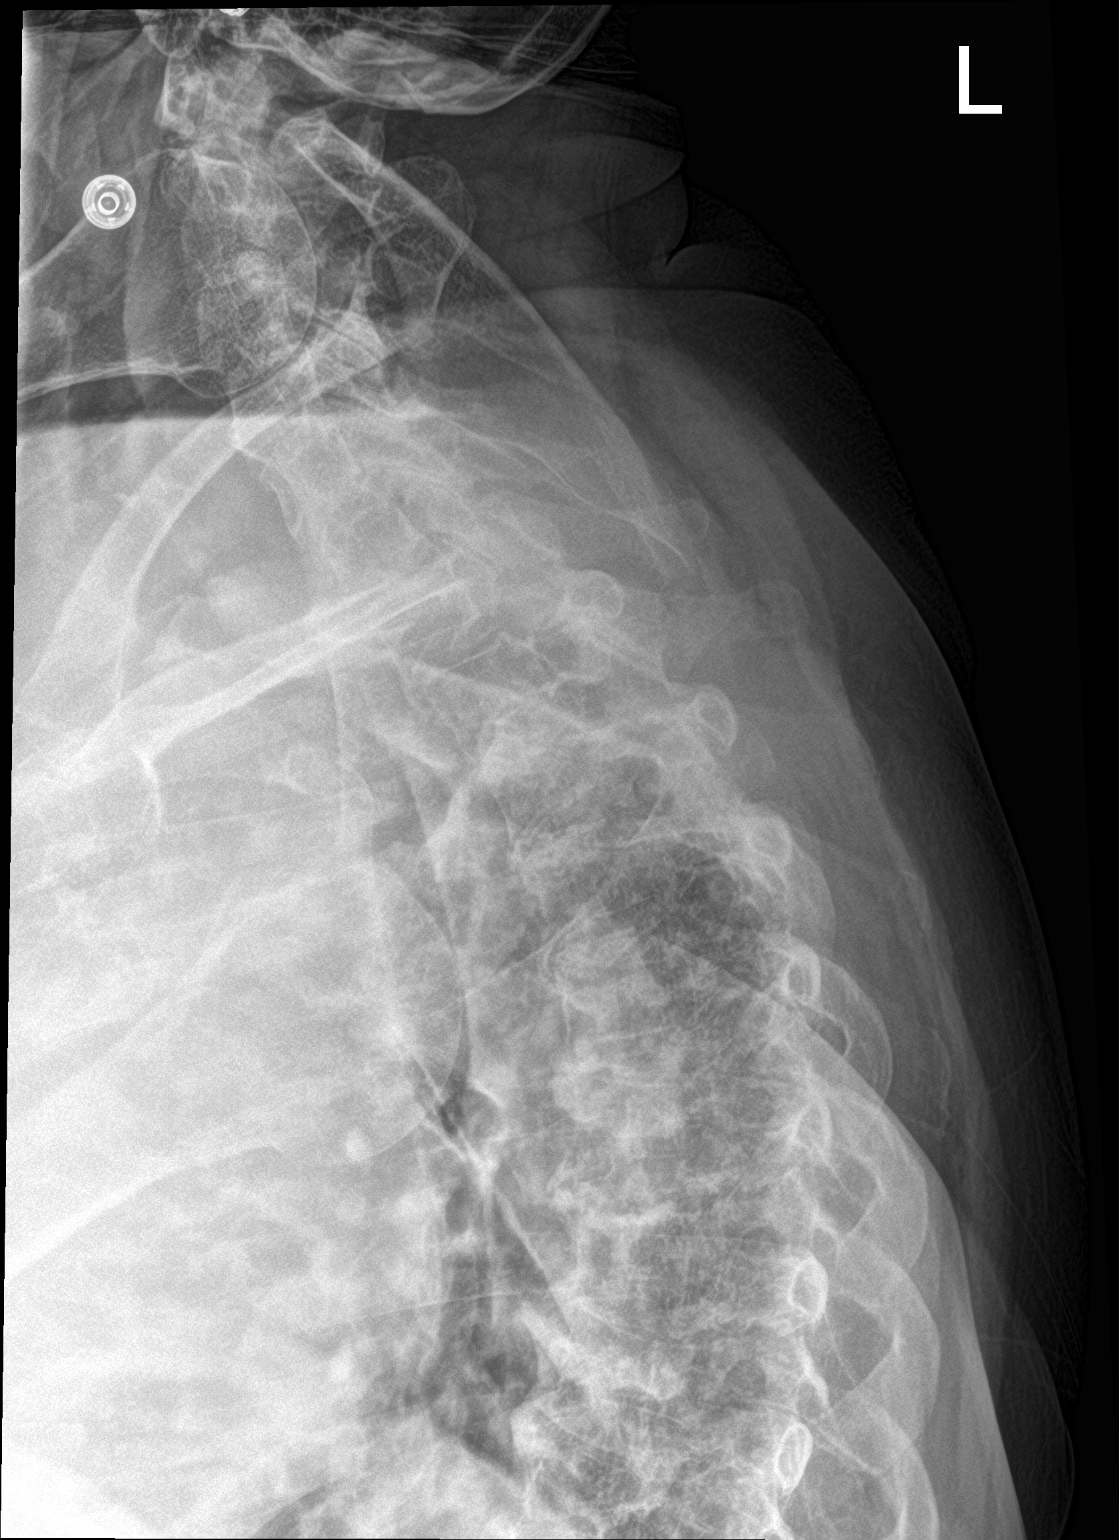

[3 of 3 positions shown; findings below may reference images not displayed]

FINDINGS: There is no evidence of thoracic spine fracture, however somewhat
limited in the upper thoracic spine. Alignment is normal. No other
significant bone abnormalities are identified. Anterior flowing
osteophytes and mild disc height loss is seen.
IMPRESSION: No definite displaced fracture.

## 2020-11-04 IMAGING — CT CT CERVICAL SPINE W/O CM
3 series · 15 of 35 positions shown, 18 images · non-contrast
Comparison: [DATE]

CLINICAL DATA: Fall, trauma

EXAM:
CT HEAD WITHOUT CONTRAST
TECHNIQUE: Contiguous axial images were obtained from the base of the skull
through the vertex without intravenous contrast.

[Series 4: c_spine 2.0 st · axial · 0.33mm/px · z∈[-253,-127]mm · 7 of 75 slices shown, 9 images]
[im 6/75  soft-tissue]
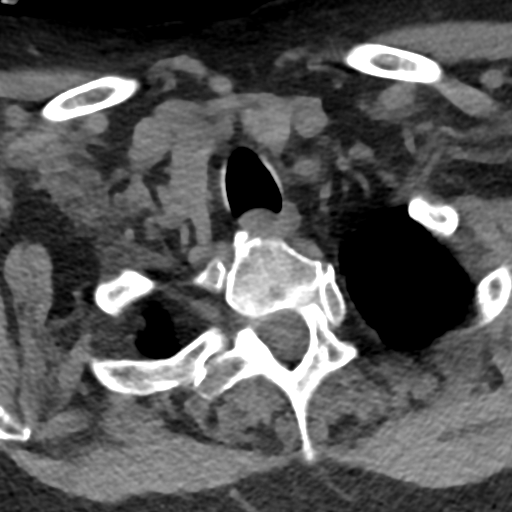
[im 6/75  bone]
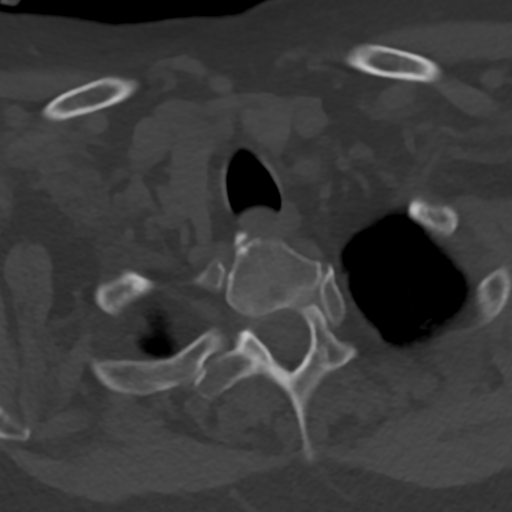
[im 18/75  bone]
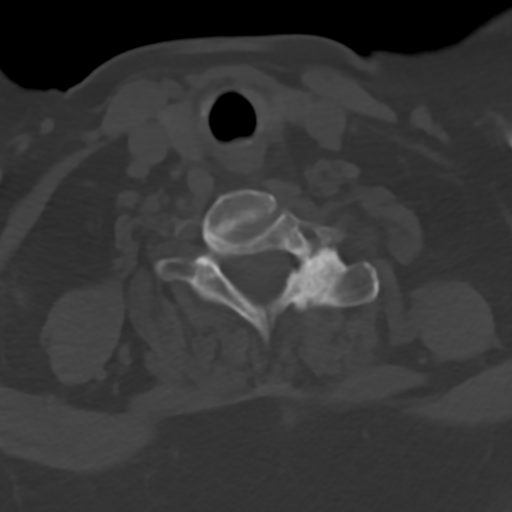
[im 29/75  bone]
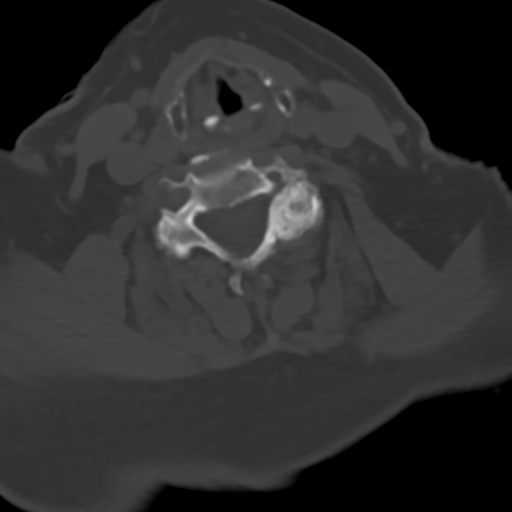
[im 40/75  bone]
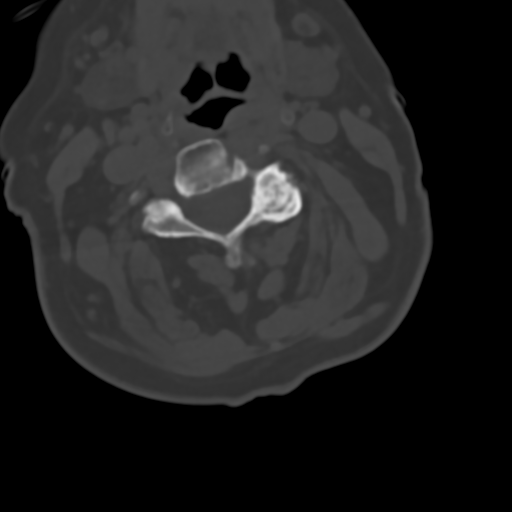
[im 46/75  soft-tissue]
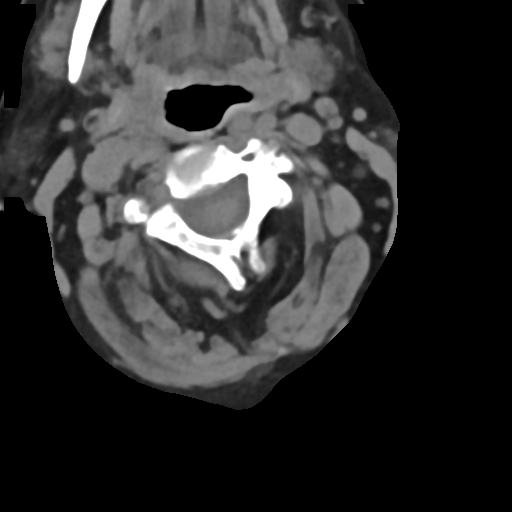
[im 46/75  bone]
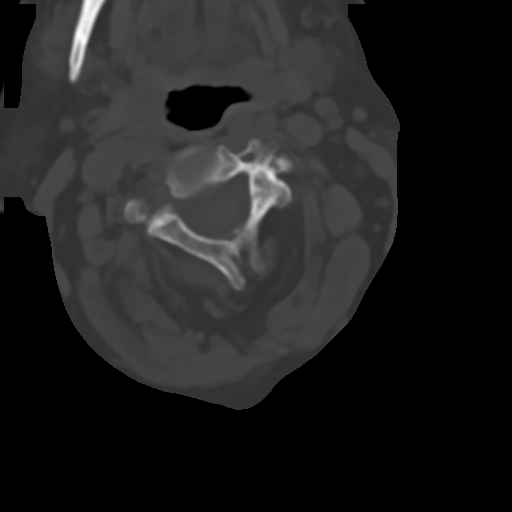
[im 57/75  bone]
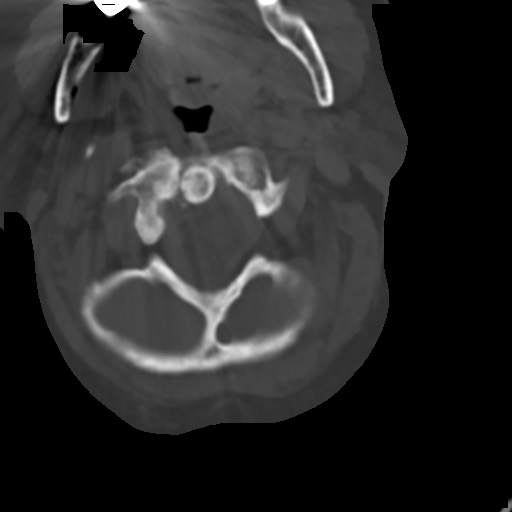
[im 69/75  bone]
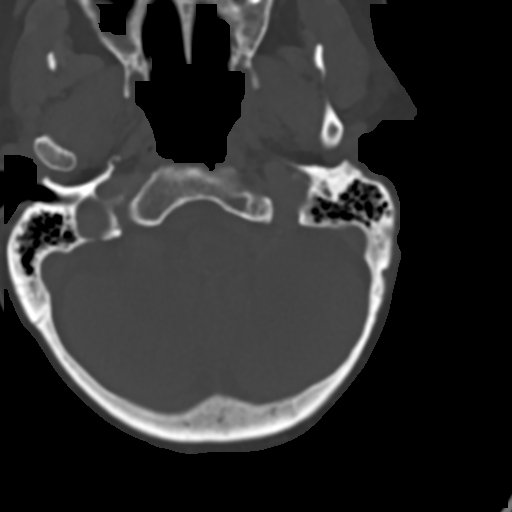

[Series 8: c_spine 2.0 sag bone · sagittal · 0.28mm/px · 5 of 61 slices shown, 6 images]
[im 21/61  bone]
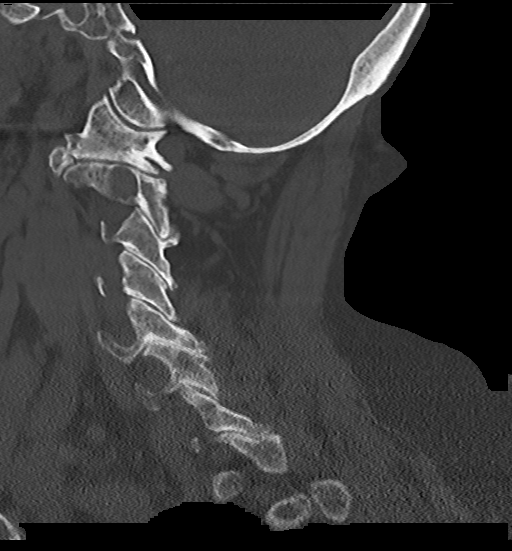
[im 26/61  bone]
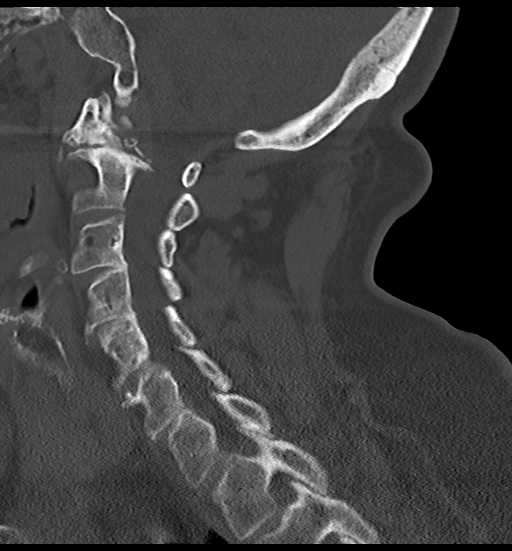
[im 31/61  soft-tissue]
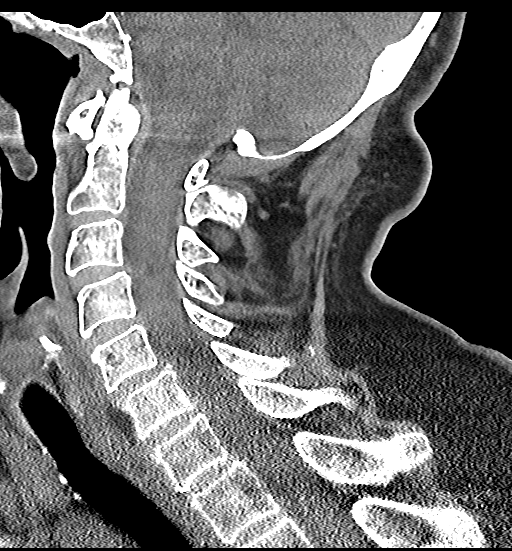
[im 31/61  bone]
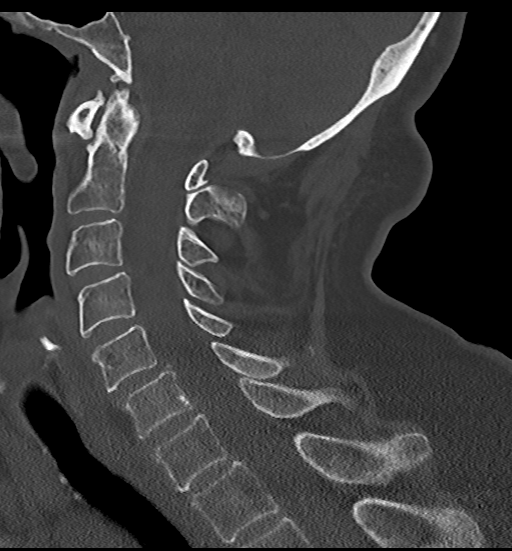
[im 36/61  bone]
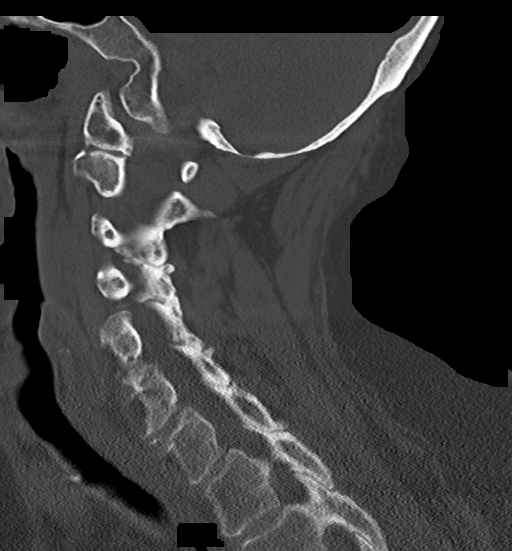
[im 41/61  bone]
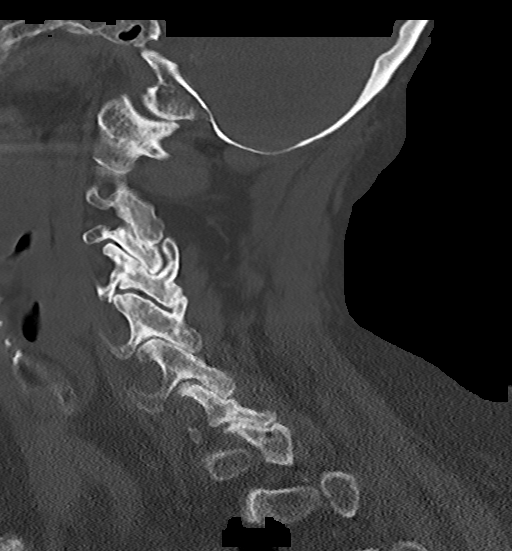

[Series 9: c_spine 2.0 cor bone · coronal · 0.23mm/px · 3 of 70 slices shown]
[im 14/70  bone]
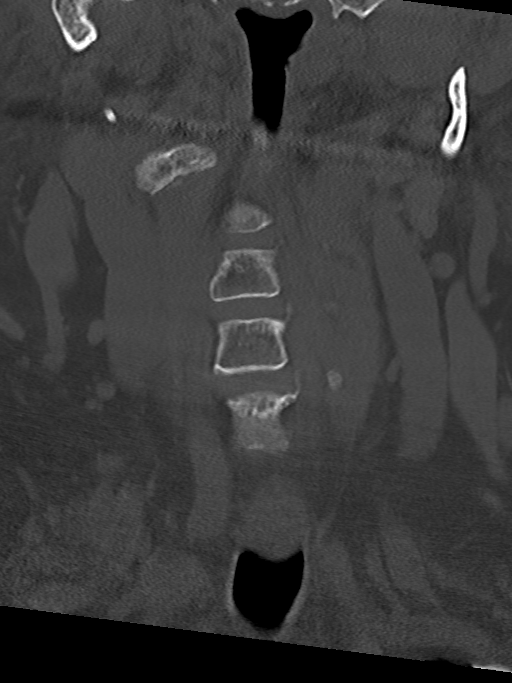
[im 28/70  bone]
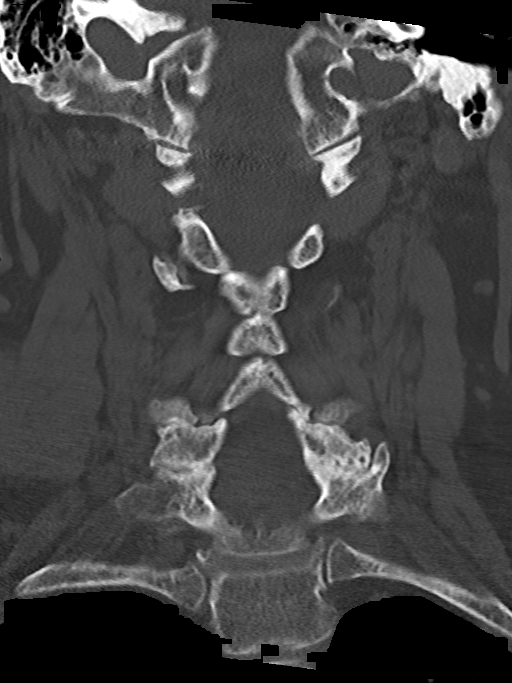
[im 42/70  bone]
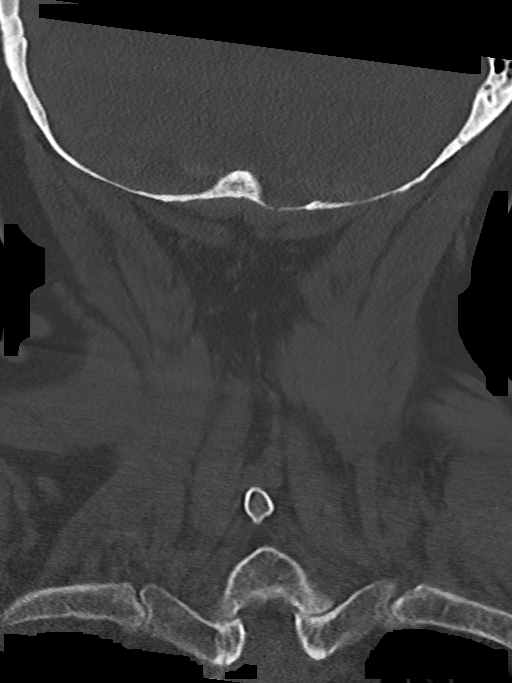

[15 of 35 positions shown; findings below may reference images not displayed]

FINDINGS: Brain: No evidence of acute territorial infarction, hemorrhage,
hydrocephalus,extra-axial collection or mass lesion/mass effect.
There is dilatation the ventricles and sulci consistent with
age-related atrophy. Low-attenuation changes in the deep white
matter consistent with small vessel ischemia.

Vascular: No hyperdense vessel or unexpected calcification.

Skull: The skull is intact.  No definite fracture seen.

Sinuses/Orbits: Mucosal thickening and fluid is seen within the
bilateral maxillary sinuses. The orbits and globes intact.

Other: None

Cervical spine:

Alignment: Physiologic

Skull base and vertebrae: Visualized skull base is intact. No
atlanto-occipital dissociation. The vertebral body heights are well
maintained. No fracture or pathologic osseous lesion seen.

Soft tissues and spinal canal: The visualized paraspinal soft
tissues are unremarkable. No prevertebral soft tissue swelling is
seen. The spinal canal is grossly unremarkable, no large epidural
collection or significant canal narrowing.

Disc levels: cervical spine spondylosis is seen with disc osteophyte
complex and uncovertebral osteophytes most notable at C3-C4 with
moderate to severe bilateral neural foraminal narrowing.

Upper chest: The lung apices are clear. Thoracic inlet is within
normal limits.

Other: None
IMPRESSION: No acute intracranial abnormality.

Findings consistent with age related atrophy and chronic small
vessel ischemia

Bilateral maxillary sinusitis

No acute fracture or malalignment of the spine.

## 2020-11-04 IMAGING — DX DG SHOULDER 2+V*R*
2 series · 2 of 2 positions shown · non-contrast
Comparison: None.

CLINICAL DATA: Fall shoulder pain

EXAM:
RIGHT SHOULDER - 2+ VIEW

[shoulder grashey]
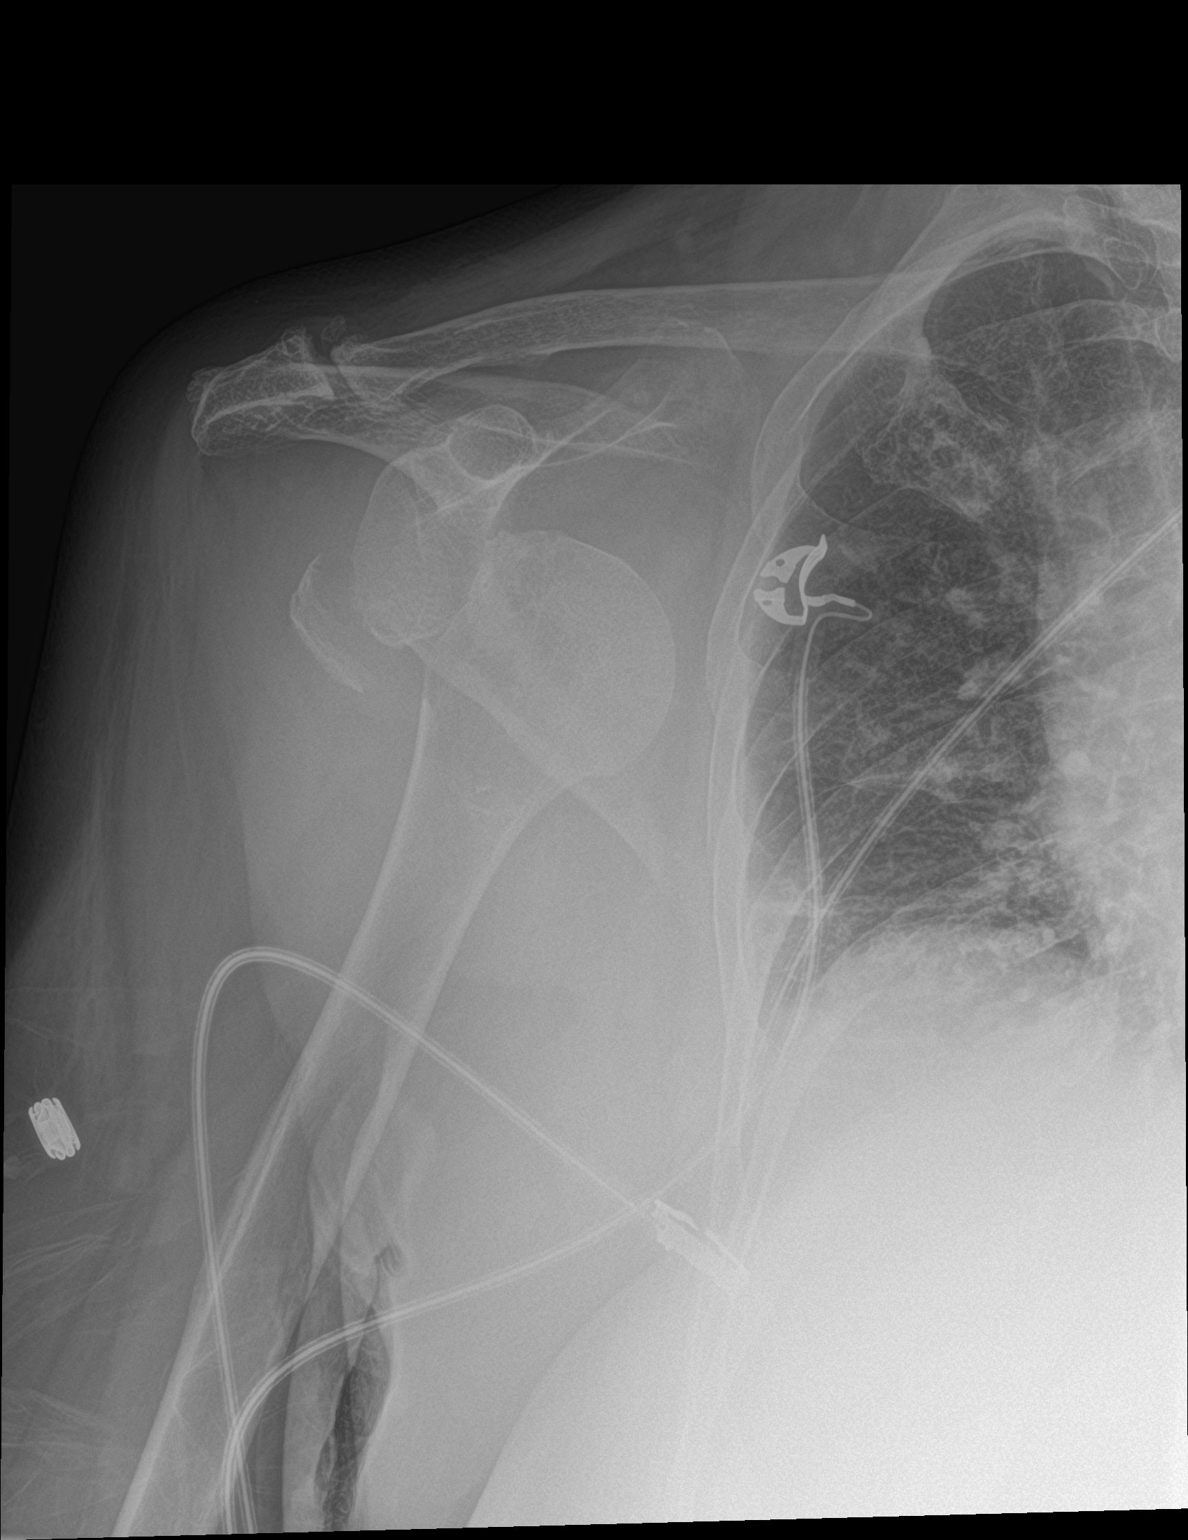

[shoulder y view]
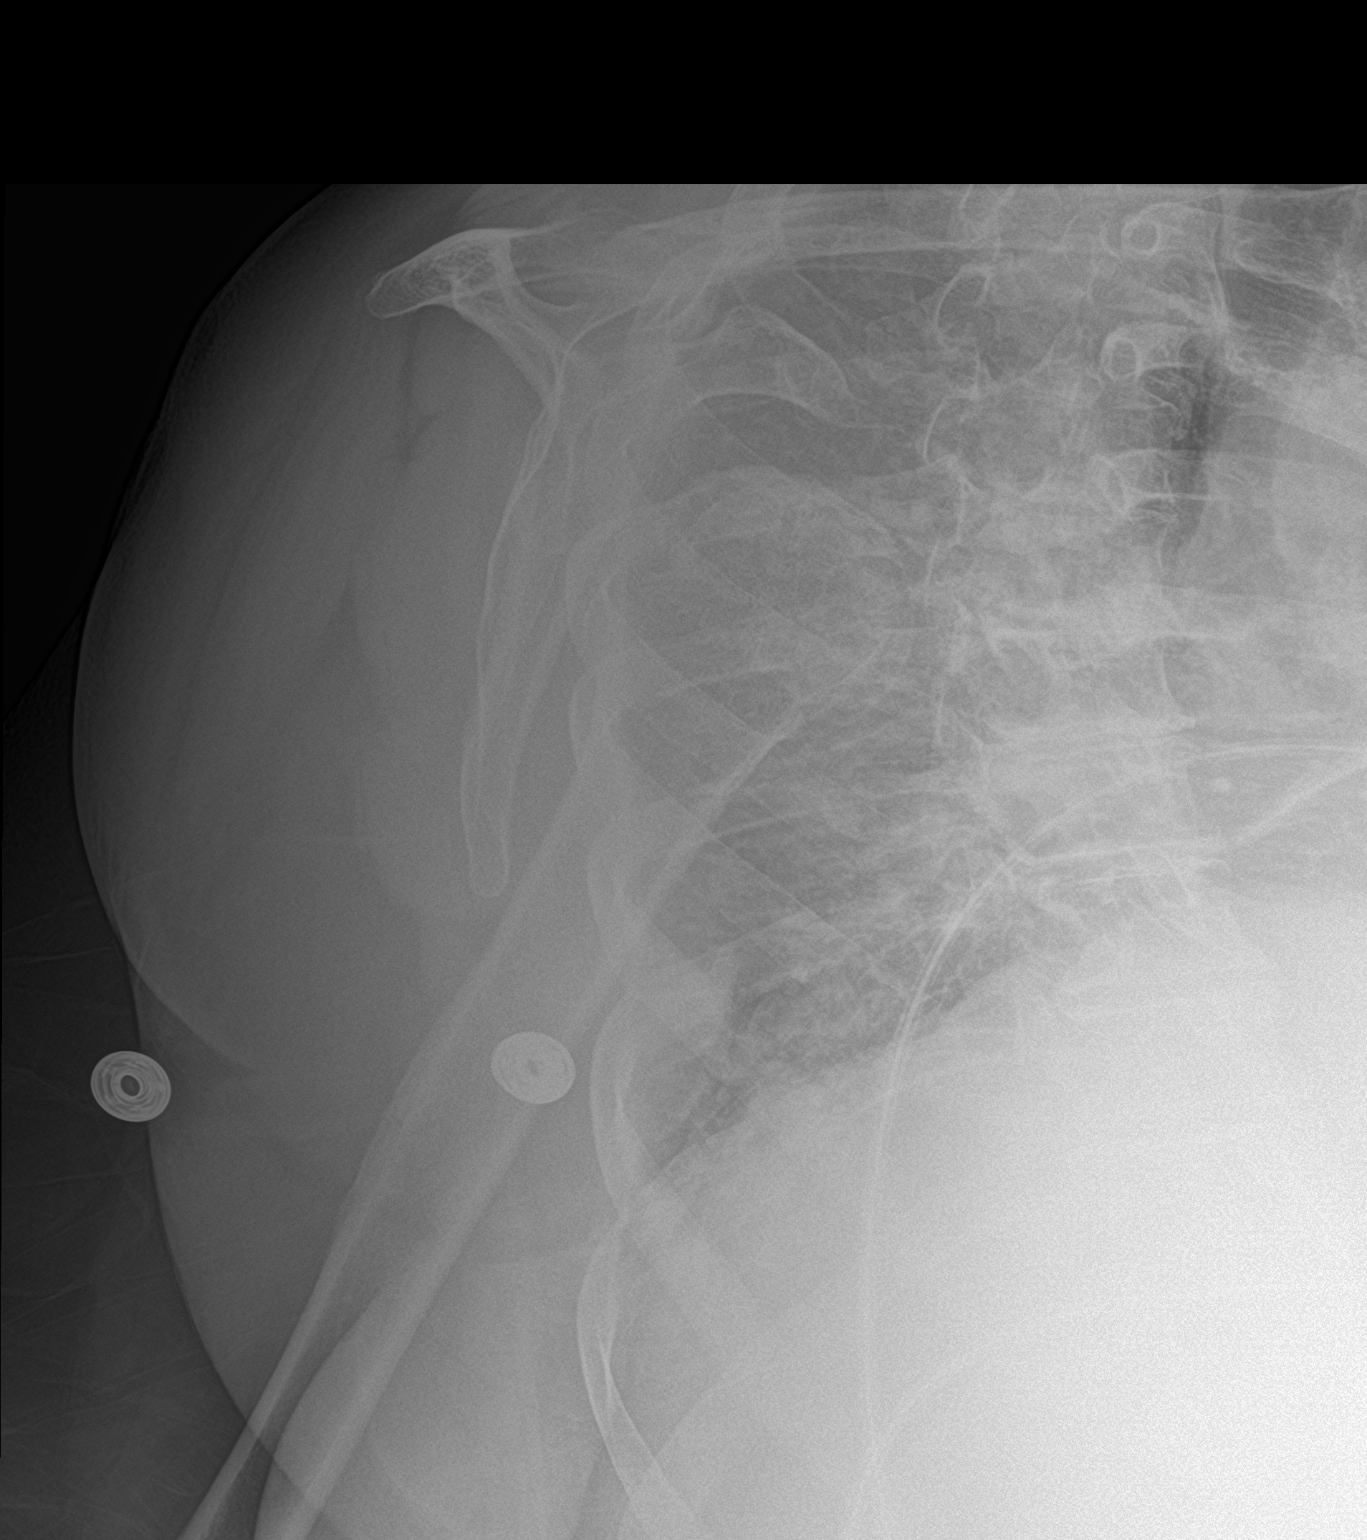

[2 of 2 positions shown; findings below may reference images not displayed]

FINDINGS: There is an anterior dislocation of the humerus with fracture
through the greater tuberosity. Overlying soft tissue swelling is
noted.
IMPRESSION: Anteroinferior dislocation of the humeral head with a fracture
through the greater tuberosity.

## 2020-11-04 IMAGING — DX DG KNEE COMPLETE 4+V*L*
4 series · 4 of 4 positions shown · non-contrast
Comparison: [DATE]

CLINICAL DATA: Pain after a fall

EXAM:
LEFT KNEE - COMPLETE 4+ VIEW

[knee ap]
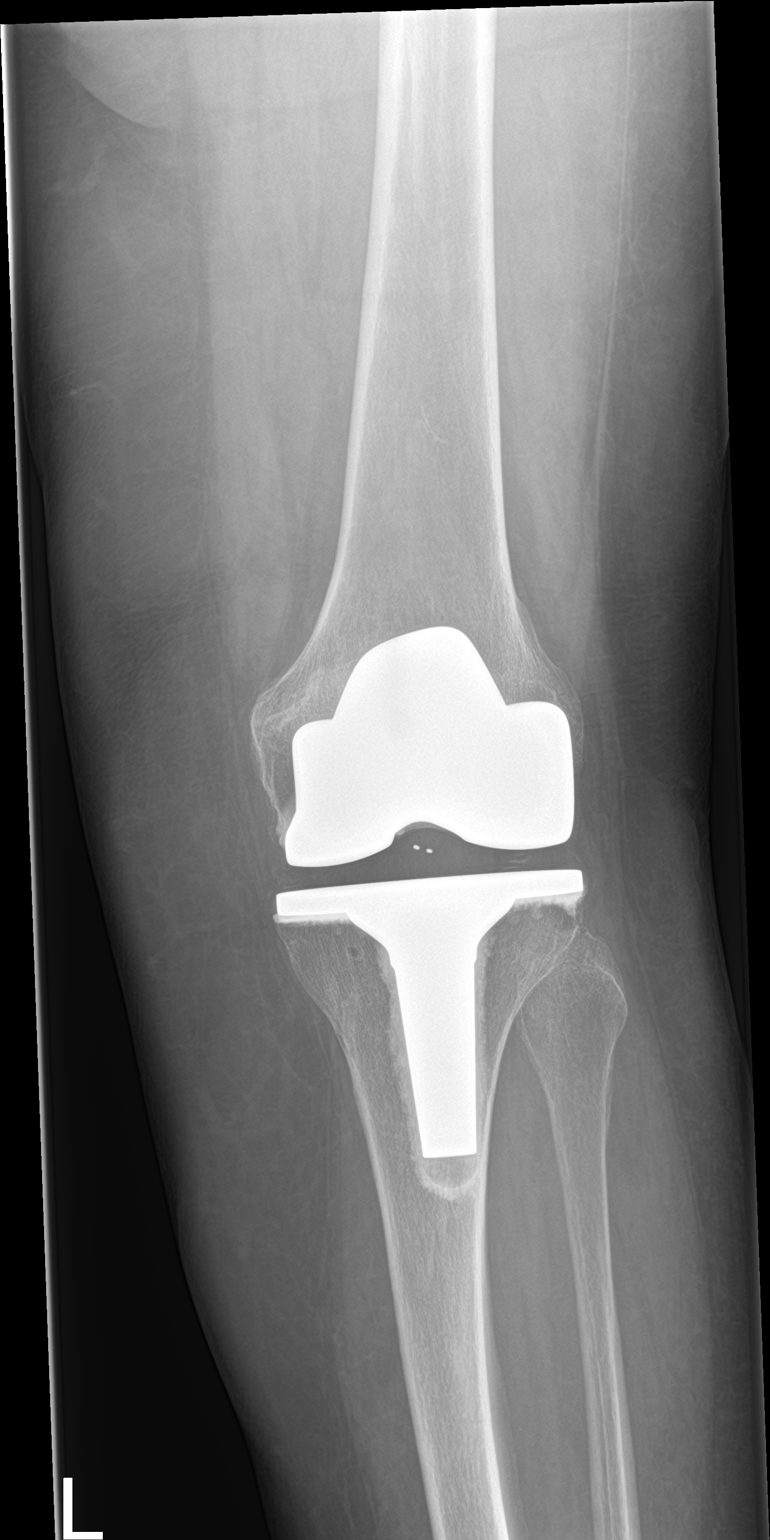

[knee lat]
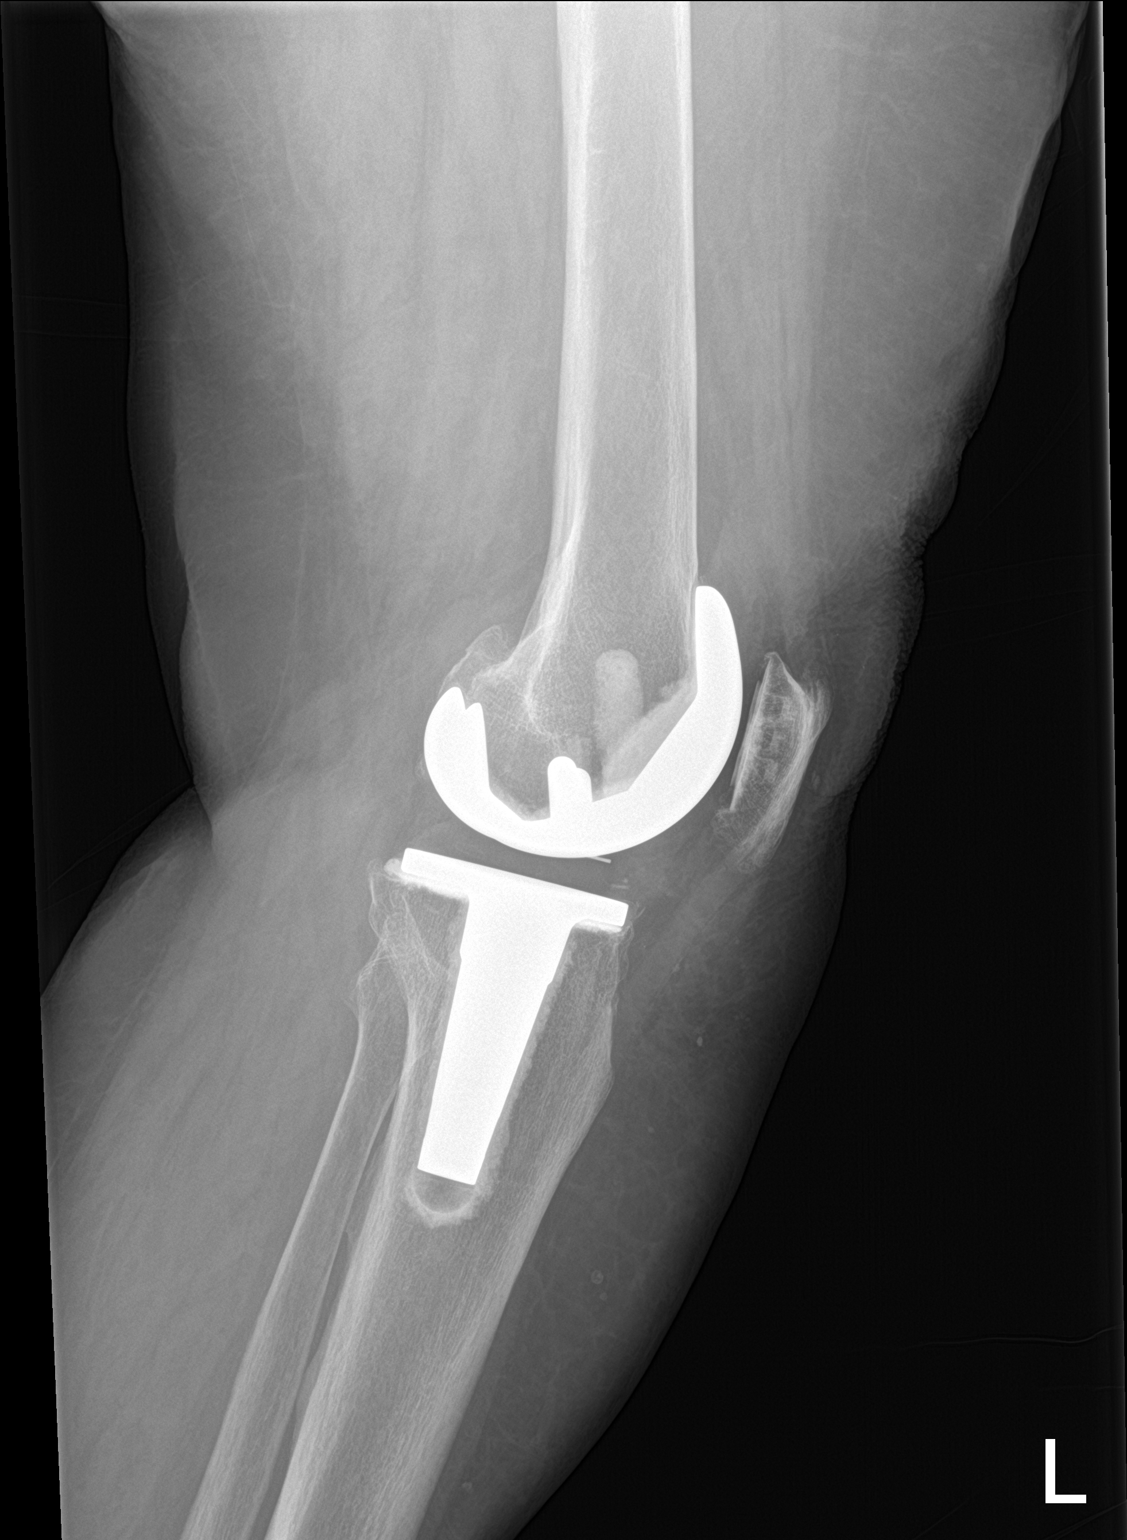

[knee obl (1 of 2)]
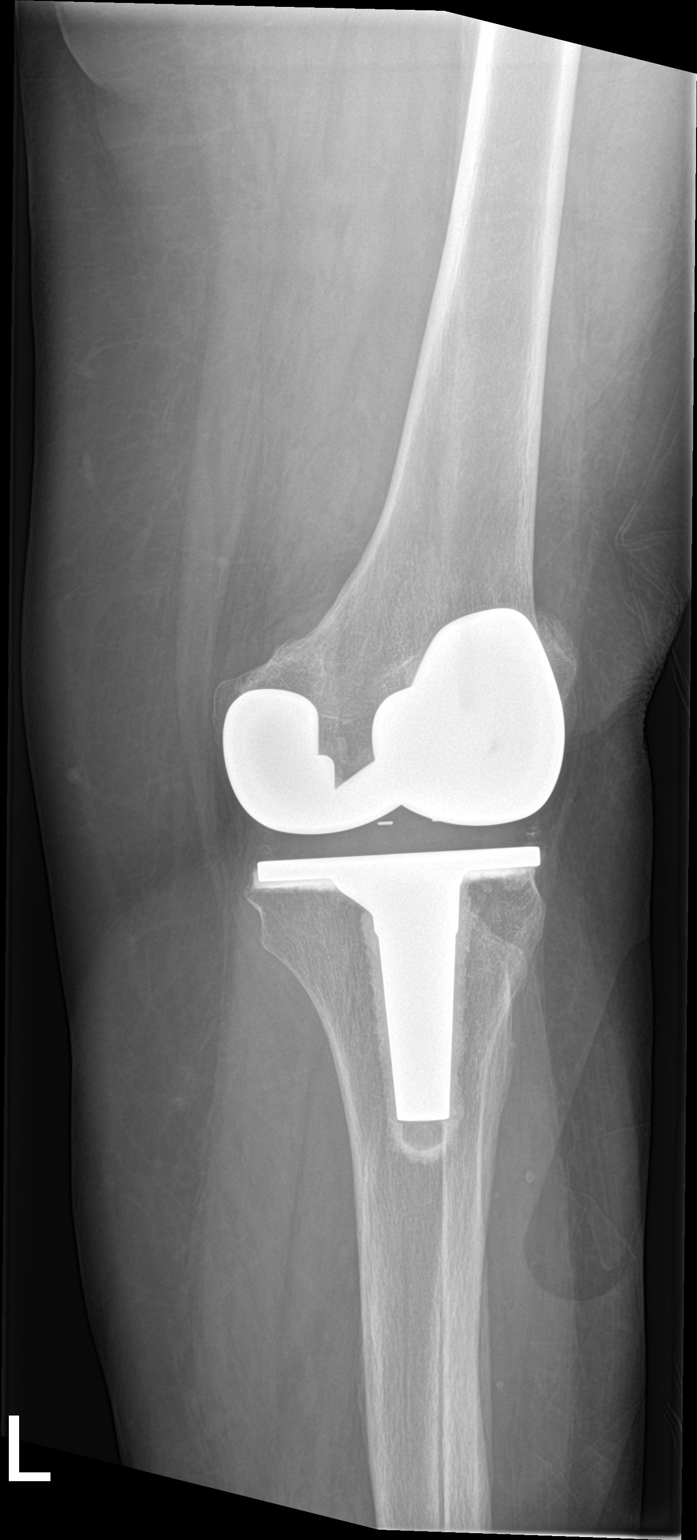

[knee obl (2 of 2)]
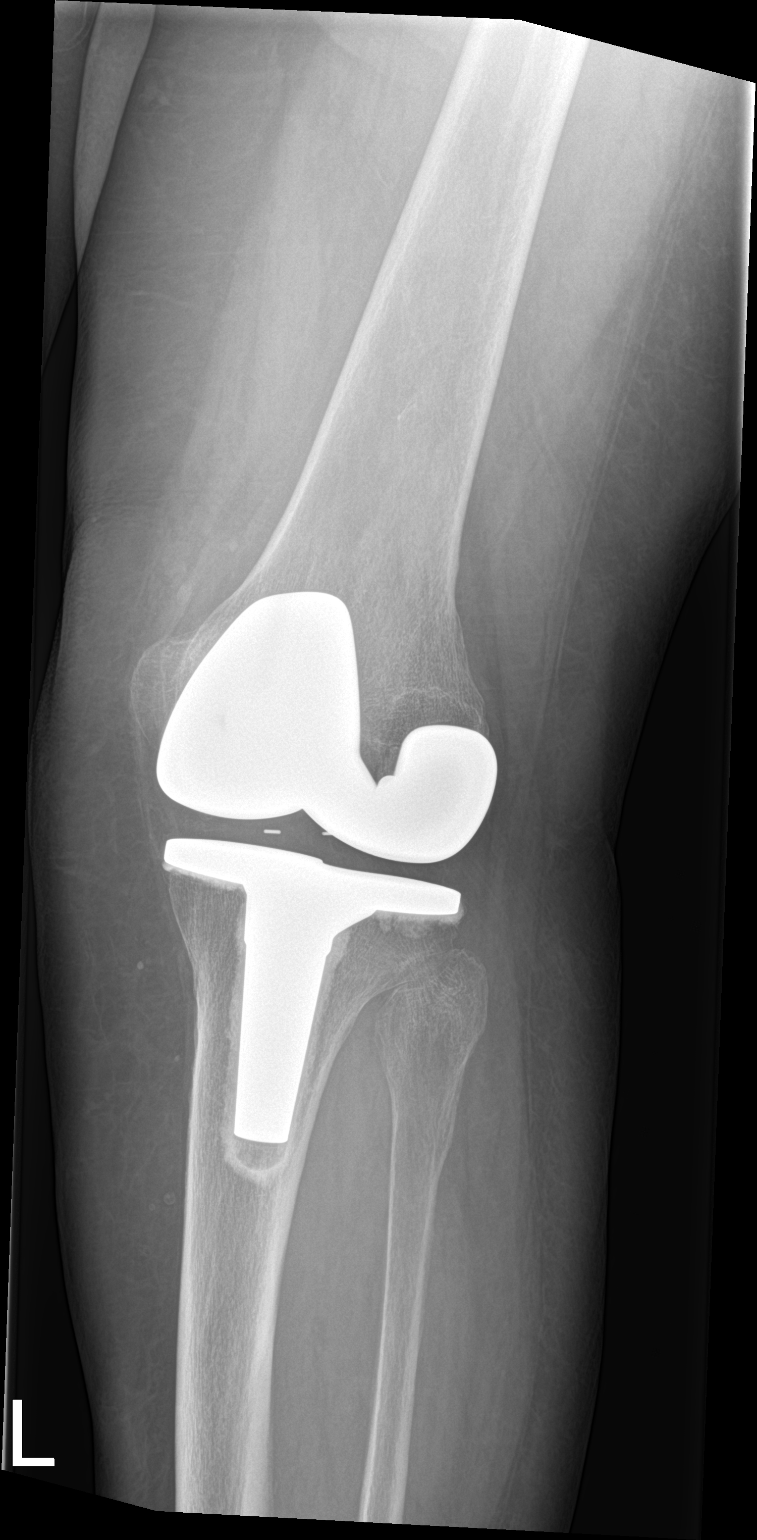

[4 of 4 positions shown; findings below may reference images not displayed]

FINDINGS: Left total knee arthroplasty using cemented components and including
a patellar femoral component. The components appear well seated. No
evidence of acute fracture or dislocation. No focal bone lesion or
bone destruction. No significant effusions. Soft tissues are
unremarkable.
IMPRESSION: Left total knee arthroplasty. The components appear well seated. No
acute bony abnormalities.

## 2020-11-04 MED ORDER — ONDANSETRON HCL 4 MG/2ML IJ SOLN
4.0000 mg | Freq: Once | INTRAMUSCULAR | Status: AC
  Administered 2020-11-04: 4 mg via INTRAVENOUS
  Filled 2020-11-04: qty 2

## 2020-11-04 MED ORDER — ONDANSETRON HCL 4 MG/2ML IJ SOLN: 4.0000 mg | Freq: Once | INTRAMUSCULAR | Status: DC

## 2020-11-04 MED ORDER — FENTANYL CITRATE (PF) 100 MCG/2ML IJ SOLN
100.0000 ug | Freq: Once | INTRAMUSCULAR | Status: AC
  Administered 2020-11-04: 100 ug via INTRAVENOUS
  Filled 2020-11-04: qty 2

## 2020-11-04 MED ORDER — FENTANYL CITRATE (PF) 100 MCG/2ML IJ SOLN
50.0000 ug | Freq: Once | INTRAMUSCULAR | Status: AC
  Administered 2020-11-04: 50 ug via INTRAVENOUS
  Filled 2020-11-04: qty 2

## 2020-11-04 MED ORDER — MORPHINE SULFATE (PF) 4 MG/ML IV SOLN: 4.0000 mg | Freq: Once | INTRAVENOUS | Status: DC

## 2020-11-04 NOTE — ED Triage Notes (Signed)
The pt fell while chasing her  Dog [id number] today  She is having much pain in her rt shoulder and she has abrasions on both hands

## 2020-11-04 NOTE — ED Provider Notes (Signed)
[facility] EMERGENCY DEPARTMENT Provider Note   CSN: [id number] Arrival date & time: 11/04/20  2027     History Chief Complaint  Patient presents with  . Fall    [name] is a 74 y.o. female with a history of paroxysmal atrial fibrillation, asthma, anemia, dyslipidemia, DJD, restless leg syndrome, tubular adenoma of colon, and obesity status post gastric bypass who presents the emergency department with a chief complaint of fall.  The patient fell at approximately 16:30 while she was chasing her dog after he escaped at a truck stop. States that she fell onto her right shoulder and onto her bilateral knees. She denies hitting her head, LOC, nausea, or vomiting. She was able to get up with assistance, but was having pain with ambulation as well as significant pain in her right shoulder. She was able to drive the remaining 2.5 hours home.  She last ate at 18:30.  In the ER, she is endorsing right shoulder pain, neck, and back pain.  She denies a history of back pain at baseline.  She is also endorsing bilateral hip and knee pain as well as paresthesias down the anterior aspect of the left leg.  Pain in her right shoulder is constant.  She has decreased range of motion and the pain is worse with attempting to move the arm.  No other known aggravating or alleviating factors.  No right wrist or elbow pain.  She denies weakness, urinary and fecal incontinence, saddle paresthesias, numbness or weakness to the right lower or bilateral upper extremities, headache, vision changes, slurred speech, facial droop, shortness of breath, chest pain, or abdominal pain.  She does not take blood thinners.  She is established with Dr. Rhona Raider as her orthopedist.  She is right-hand dominant.  The history is provided by the patient and medical records. No language interpreter was used.       Past Medical History:  Diagnosis Date  . Anemia    unable to absorb iron after gastric bypass   . Arthritis    generalized  . Asthma   . Atrophic vaginitis   . Back pain    DDD/stenosis  . Carotid stenosis    Carotid US 10/16: Plaque RICA (9-24%), normal LICA  . Depression    takes Cymbalta daily  . Diverticulosis    benign  . DJD (degenerative joint disease)   . Dyslipidemia   . Dysrhythmia   . Family history of GI bleeding   . GERD (gastroesophageal reflux disease)    takes Nexium daily  . Gestational diabetes   . H/O hiatal hernia    surgery for hernia  . Headache(784.0)    takes Imitrex daily as needed and Bisoprolol daily;last migraine was about 2wks ago  . History of bronchitis 1 yr ago  . History of shingles   . Insomnia    takes Ambien nightly  . Joint pain   . Joint swelling   . Leg cramps    takes Flexeril daily as needed  . 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 Nocturia   . Osteoporosis   . Peripheral neuropathy    takes Gabapentin daily  . Pneumonia 37yrs ago   hx of  . Restless leg syndrome    takes Requip daily  . RLS (restless legs syndrome) 08/16/2015  . Sleep apnea    uses CPAP  . Tubular adenoma of colon   . Vertigo   . Vitamin D deficiency     Patient Active Problem  List   Diagnosis Date Noted  . Acquired iron deficiency anemia due to decreased absorption 04/24/2020  . Low ferritin 03/27/2020  . Status post gastric bypass for obesity 11/23/2019  . Status post bariatric surgery 02/02/2018  . Other symptoms and signs involving the nervous system 02/02/2018  . Familial hypercholesterolemia 01/22/2018  . Paroxysmal atrial fibrillation ([place]) 01/22/2018  . Torticollis, acquired 10/23/2016  . Asthma 08/04/2016  . Other secondary pulmonary hypertension ([place]) 06/11/2016  . 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 Chronic migraine without aura without status migrainosus, not intractable 03/19/2016  . Chronic migraine without aura, with intractable migraine, so stated, with status migrainosus 01/22/2016  . History of gastric bypass 12/05/2015  .  RLS (restless legs syndrome) 08/16/2015  . Vitamin B12 deficiency anemia due to intrinsic factor deficiency 08/16/2015  . Iron deficiency anemia 08/16/2015  . Osteoporosis 08/16/2015  . OSA on CPAP 08/16/2015  . Primary osteoarthritis of left knee 04/03/2015  . Obstructive sleep apnea 10/19/2014  . 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 Hyperlipidemia 10/19/2014  . Morbid obesity ([address]) 10/19/2014    Past Surgical History:  Procedure Laterality Date  . COLONOSCOPY    . EP IMPLANTABLE DEVICE N/A 11/19/2016   Procedure: Loop Recorder Insertion;  Surgeon: Deboraha Sprang, MD;  Location: [facility];  Service: Cardiovascular;  Laterality: N/A;  . ESOPHAGOGASTRODUODENOSCOPY    . excess skin removal     post weight loss  . GASTRIC BYPASS  2001  . HERNIA REPAIR     umbilical  . JOINT REPLACEMENT Right    knee  . skin reduction  2003 2004   stomach and arm  . STEROID INJECTION TO SCAR     x 2 in back   . TOTAL KNEE ARTHROPLASTY Left 04/03/2015   Procedure: LEFT TOTAL KNEE ARTHROPLASTY;  Surgeon: Melrose Nakayama, MD;  Location: [place] [facility];  Service: Orthopedics;  Laterality: Left;  . WISDOM TOOTH EXTRACTION       OB History   No obstetric history on file.     Family History  Problem Relation Age of Onset  . CAD Mother   . Hypertension Mother   . Neuropathy Mother   . Osteoarthritis Mother   . Heart disease Mother   . Breast cancer Maternal Grandmother   . CAD Paternal Grandfather   . Colon cancer Father     Social History   Tobacco Use  . Smoking status: Never Smoker  . Smokeless tobacco: Never Used  Vaping Use  . Vaping Use: Never used  Substance Use Topics  . Alcohol use: No    Alcohol/week: 0.0 standard drinks  . Drug use: No    Home Medications Prior to Admission medications   Medication Sig Start Date End Date Taking? Authorizing Provider  allopurinol (ZYLOPRIM) 100 MG tablet TK 1 T PO QD FOR GOUT PREVENTION OR FLARE 05/19/19   [provider]  amphetamine-dextroamphetamine (ADDERALL) 10 MG tablet Take 1 tablet (10 mg total) by mouth daily before breakfast. 02/02/18 07/13/20  Dohmeier, Asencion Partridge, MD  ASPIRIN 81 PO Take 81 mg by mouth at bedtime.    [provider]  baclofen (LIORESAL) 10 MG tablet TAKE 1 TABLET(10 MG) BY MOUTH TWICE DAILY 08/27/19   Melvenia Beam, MD  bisoprolol (ZEBETA) 10 MG tablet Take 10 mg by mouth 2 (two) times daily.    [provider]  calcium carbonate (OS-CAL) 1250 (500 CA) MG chewable tablet Chew 1 tablet by mouth daily. Not sure of  dosage    [provider]  diclofenac sodium (VOLTAREN) 1 % GEL Apply 4 g topically 4 (four) times daily. 08/20/18   Melvenia Beam, MD  DULoxetine (CYMBALTA) 60 MG capsule Take 60 mg by mouth daily after supper. 08/06/16   [provider]  Erenumab-aooe (AIMOVIG) 140 MG/ML SOAJ Inject 140 mg into the skin every 30 (thirty) days. 09/23/18   Melvenia Beam, MD  esomeprazole (NEXIUM) 40 MG capsule Take 40 mg by mouth 2 (two) times daily before a meal.  12/20/14   [provider]  Evolocumab (REPATHA SURECLICK) 631 MG/ML SOAJ Inject 1 pen into the skin every 14 (fourteen) days. 10/18/20   [name], MD  furosemide (LASIX) 20 MG tablet Take 40 mg by mouth daily as needed for fluid or edema.    [provider]  HYDROcodone-acetaminophen (NORCO) 10-325 MG tablet Take 1 tablet by mouth every 8 (eight) hours as needed for moderate pain or severe pain. Takes for migraines    [provider]  Loratadine (CLARITIN) 10 MG CAPS Take 1 capsule by mouth as needed (for allergies).     [provider]  ondansetron (ZOFRAN-ODT) 4 MG disintegrating tablet DISSOLVE 1 TABLET(4 MG) ON THE TONGUE EVERY 8 HOURS AS NEEDED FOR NAUSEA OR VOMITING 02/14/20   Melvenia Beam, MD  pramipexole (MIRAPEX) 1 MG tablet TAKE 1 TABLET(1 MG) BY MOUTH AT BEDTIME 08/23/20   Dohmeier, Asencion Partridge, MD  PROAIR HFA 108 (90 Base) MCG/ACT inhaler 2  INHALATIONS EVERY 4 HOURS AS NEEDED FOR ASTHMA SYMPTOMS (WHEEZING/SHORTNESS OF BREATH) 11/26/15   [provider]  quiNINE Orlin Hilding) 324 MG capsule Take bid po. 07/31/20   Dohmeier, Asencion Partridge, MD  rOPINIRole (REQUIP) 1 MG tablet TAKE 1 TABLET(1 MG) BY MOUTH TWICE DAILY BEFORE LUNCH AND SUPPER 06/25/20   Dohmeier, Asencion Partridge, MD  Vitamin D, Ergocalciferol, (DRISDOL) 50000 units CAPS capsule Take 1 capsule (50,000 Units total) by mouth every 7 (seven) days. 02/02/18   Dohmeier, Asencion Partridge, MD  zolpidem Lorrin Mais) 10 MG tablet  12/31/18   [provider]    Allergies    Patient has no known allergies.  Review of Systems   Review of Systems  Constitutional: Negative for activity change, chills and fever.  HENT: Negative for congestion and sore throat.   Eyes: Negative for visual disturbance.  Respiratory: Negative for cough, shortness of breath and wheezing.   Cardiovascular: Negative for chest pain and palpitations.  Gastrointestinal: Negative for abdominal pain, diarrhea, nausea and vomiting.  Genitourinary: Negative for dysuria.  Musculoskeletal: Positive for arthralgias, myalgias and neck pain. Negative for back pain, gait problem, joint swelling and neck stiffness.  Skin: Negative for color change, rash and wound.  Allergic/Immunologic: Negative for immunocompromised state.  Neurological: Negative for dizziness, seizures, syncope, weakness, light-headedness, numbness and headaches.       Paresthesias  Psychiatric/Behavioral: Negative for confusion.    Physical Exam Updated Vital Signs BP (!) 148/62   Pulse 96   Temp 98.7 F (37.1 C)   Resp 19   Ht 5\' 7"  (1.702 m)   Wt 105.2 kg   SpO2 100%   BMI 36.32 kg/m   Physical Exam Vitals and nursing note reviewed.  Constitutional:      General: She is not in acute distress.    Appearance: She is not ill-appearing, toxic-appearing or diaphoretic.  HENT:     Head: Normocephalic.  Eyes:     Conjunctiva/sclera: Conjunctivae  normal.  Cardiovascular:     Rate  and Rhythm: Normal rate and regular rhythm.     Heart sounds: No murmur heard.  No friction rub. No gallop.   Pulmonary:     Effort: Pulmonary effort is normal. No respiratory distress.     Breath sounds: No stridor. No wheezing, rhonchi or rales.  Chest:     Chest wall: No tenderness.  Abdominal:     General: There is no distension.     Palpations: Abdomen is soft. There is no mass.     Tenderness: There is no abdominal tenderness. There is no right CVA tenderness, left CVA tenderness, guarding or rebound.     Hernia: No hernia is present.  Musculoskeletal:        General: Tenderness, deformity and signs of injury present.     Cervical back: Neck supple.     Right lower leg: No edema.     Left lower leg: No edema.     Comments: Obvious deformity of the right shoulder.  Unable to assess range of motion due to pain.  No tenderness to the right elbow or wrist.  Full active and passive range of motion of the right wrist.  Range of motion of the right elbow is decreased secondary to pain in the right shoulder.  Normal exam of the left upper extremity.  She has neurovascular intact throughout the bilateral upper extremities.  She is diffusely tender to palpation to the cervical, thoracic, and lumbar spine.  No crepitus or step-offs.  Tender to palpation over the bilateral SI joints.  There is tenderness to palpation over the left lateral hip.  She is tender to palpation over the lateral joint line of the left knee as well as to the bilateral tibial plateau.  There is associated edema and ecchymosis.  Normal exam of the left ankle.  She is endorsing paresthesias, but sensation is intact and equal throughout the bilateral lower extremities.  5-5 strength against resistance.  DP and PT pulses are 2+ and symmetric.  Tender palpation to the anterior right hip.  There is mild, diffuse tenderness throughout the right knee.  Full active and passive range of motion of all  joints of the right lower extremity.  She has neurovascular intact throughout.  No shortening noted to the bilateral lower extremities.  Skin:    General: Skin is warm.     Findings: No rash.  Neurological:     Mental Status: She is alert.  Psychiatric:        Behavior: Behavior normal.     ED Results / Procedures / Treatments   Labs (all labs ordered are listed, but only abnormal results are displayed) Labs Reviewed  RESPIRATORY PANEL BY RT PCR (FLU A&B, COVID)    EKG None  Radiology DG Thoracic Spine 2 View  Result Date: 11/04/2020 CLINICAL DATA:  Fall EXAM: THORACIC SPINE 2 VIEWS COMPARISON:  None. FINDINGS: There is no evidence of thoracic spine fracture, however somewhat limited in the upper thoracic spine. Alignment is normal. No other significant bone abnormalities are identified. Anterior flowing osteophytes and mild disc height loss is seen. IMPRESSION: No definite displaced fracture. Electronically Signed   By: Prudencio Pair M.D.   On: 11/04/2020 23:41   DG Lumbar Spine 2-3 Views  Result Date: 11/04/2020 CLINICAL DATA:  Back pain EXAM: LUMBAR SPINE - 2-3 VIEW COMPARISON:  CT October 25, 2019 FINDINGS: There is no evidence of lumbar spine fracture. There is a grade 1 anterolisthesis of L5 on S1 due to chronic bilateral pars  defects. Mild disc height loss with anterior osteophytes are seen throughout the lumbar spine. Scattered vascular calcifications are noted. IMPRESSION: No definite acute fracture. Electronically Signed   By: Prudencio Pair M.D.   On: 11/04/2020 23:47   DG Shoulder Right  Result Date: 11/04/2020 CLINICAL DATA:  Fall shoulder pain EXAM: RIGHT SHOULDER - 2+ VIEW COMPARISON:  None. FINDINGS: There is an anterior dislocation of the humerus with fracture through the greater tuberosity. Overlying soft tissue swelling is noted. IMPRESSION: Anteroinferior dislocation of the humeral head with a fracture through the greater tuberosity. Electronically Signed   By:  Prudencio Pair M.D.   On: 11/04/2020 22:35   CT Head Wo Contrast  Result Date: 11/04/2020 CLINICAL DATA:  Fall, trauma EXAM: CT HEAD WITHOUT CONTRAST TECHNIQUE: Contiguous axial images were obtained from the base of the skull through the vertex without intravenous contrast. COMPARISON:  October 25, 2019 FINDINGS: Brain: No evidence of acute territorial infarction, hemorrhage, hydrocephalus,extra-axial collection or mass lesion/mass effect. There is dilatation the ventricles and sulci consistent with age-related atrophy. Low-attenuation changes in the deep white matter consistent with small vessel ischemia. Vascular: No hyperdense vessel or unexpected calcification. Skull: The skull is intact.  No definite fracture seen. Sinuses/Orbits: Mucosal thickening and fluid is seen within the bilateral maxillary sinuses. The orbits and globes intact. Other: None Cervical spine: Alignment: Physiologic Skull base and vertebrae: Visualized skull base is intact. No atlanto-occipital dissociation. The vertebral body heights are well maintained. No fracture or pathologic osseous lesion seen. Soft tissues and spinal canal: The visualized paraspinal soft tissues are unremarkable. No prevertebral soft tissue swelling is seen. The spinal canal is grossly unremarkable, no large epidural collection or significant canal narrowing. Disc levels: cervical spine spondylosis is seen with disc osteophyte complex and uncovertebral osteophytes most notable at C3-C4 with moderate to severe bilateral neural foraminal narrowing. Upper chest: The lung apices are clear. Thoracic inlet is within normal limits. Other: None IMPRESSION: No acute intracranial abnormality. Findings consistent with age related atrophy and chronic small vessel ischemia Bilateral maxillary sinusitis No acute fracture or malalignment of the spine. Electronically Signed   By: Prudencio Pair M.D.   On: 11/04/2020 23:33   CT Cervical Spine Wo Contrast  Result Date:  11/04/2020 CLINICAL DATA:  Fall, trauma EXAM: CT HEAD WITHOUT CONTRAST TECHNIQUE: Contiguous axial images were obtained from the base of the skull through the vertex without intravenous contrast. COMPARISON:  October 25, 2019 FINDINGS: Brain: No evidence of acute territorial infarction, hemorrhage, hydrocephalus,extra-axial collection or mass lesion/mass effect. There is dilatation the ventricles and sulci consistent with age-related atrophy. Low-attenuation changes in the deep white matter consistent with small vessel ischemia. Vascular: No hyperdense vessel or unexpected calcification. Skull: The skull is intact.  No definite fracture seen. Sinuses/Orbits: Mucosal thickening and fluid is seen within the bilateral maxillary sinuses. The orbits and globes intact. Other: None Cervical spine: Alignment: Physiologic Skull base and vertebrae: Visualized skull base is intact. No atlanto-occipital dissociation. The vertebral body heights are well maintained. No fracture or pathologic osseous lesion seen. Soft tissues and spinal canal: The visualized paraspinal soft tissues are unremarkable. No prevertebral soft tissue swelling is seen. The spinal canal is grossly unremarkable, no large epidural collection or significant canal narrowing. Disc levels: cervical spine spondylosis is seen with disc osteophyte complex and uncovertebral osteophytes most notable at C3-C4 with moderate to severe bilateral neural foraminal narrowing. Upper chest: The lung apices are clear. Thoracic inlet is within normal limits. Other: None IMPRESSION: No acute  intracranial abnormality. Findings consistent with age related atrophy and chronic small vessel ischemia Bilateral maxillary sinusitis No acute fracture or malalignment of the spine. Electronically Signed   By: Prudencio Pair M.D.   On: 11/04/2020 23:33   DG Shoulder Right Portable  Result Date: 11/05/2020 CLINICAL DATA:  Post reduction EXAM: PORTABLE RIGHT SHOULDER COMPARISON:   November 04, 2020 FINDINGS: There is interval reduction of the previously demonstrated dislocated right shoulder. Again noted is a fracture through the greater tuberosity. There are degenerative changes of the glenohumeral and right AC joint. IMPRESSION: Successful reduction of the previously demonstrated dislocated right shoulder. Persistent fracture of the greater tuberosity. Electronically Signed   By: Constance Holster M.D.   On: 11/05/2020 00:53   DG Knee Complete 4 Views Left  Result Date: 11/04/2020 CLINICAL DATA:  Pain after a fall EXAM: LEFT KNEE - COMPLETE 4+ VIEW COMPARISON:  05/05/2016 FINDINGS: Left total knee arthroplasty using cemented components and including a patellar femoral component. The components appear well seated. No evidence of acute fracture or dislocation. No focal bone lesion or bone destruction. No significant effusions. Soft tissues are unremarkable. IMPRESSION: Left total knee arthroplasty. The components appear well seated. No acute bony abnormalities. Electronically Signed   By: Lucienne Capers M.D.   On: 11/04/2020 23:45   DG Knee Complete 4 Views Right  Result Date: 11/04/2020 CLINICAL DATA:  Neck, back, hip, and knee pain after a fall. EXAM: RIGHT KNEE - COMPLETE 4+ VIEW COMPARISON:  05/05/2016 FINDINGS: Right total knee arthroplasty with cemented components. There is a patella femoral component. Components appear well seated. No evidence of acute fracture or dislocation. No focal bone lesions or bone destruction. Mild callus formation demonstrated around the proximal fibular neck likely representing old deformity. No significant effusions. Soft tissues are unremarkable. IMPRESSION: Right total knee arthroplasty. Components appear well seated. No acute bony abnormalities. Electronically Signed   By: Lucienne Capers M.D.   On: 11/04/2020 23:44   DG Hip Unilat W or Wo Pelvis 2-3 Views Left  Result Date: 11/04/2020 CLINICAL DATA:  Pain after a fall EXAM: DG HIP  (WITH OR WITHOUT PELVIS) 2-3V LEFT COMPARISON:  02/10/2020 FINDINGS: Degenerative changes in the lower lumbar spine and both hips. No evidence of acute fracture or dislocation. No focal bone lesion or bone destruction. Bone cortex appears intact. Soft tissues are unremarkable. IMPRESSION: Degenerative changes in the lower lumbar spine and both hips. No acute fracture or dislocation. Electronically Signed   By: Lucienne Capers M.D.   On: 11/04/2020 23:49   DG Hip Unilat W or Wo Pelvis 2-3 Views Right  Result Date: 11/04/2020 CLINICAL DATA:  Pain after a fall EXAM: DG HIP (WITH OR WITHOUT PELVIS) 2-3V RIGHT COMPARISON:  02/10/2020 FINDINGS: Degenerative changes in the lower lumbar spine and both hips. No evidence of acute fracture or dislocation involving the pelvis or right hip. No focal bone lesion or bone destruction. Soft tissues are unremarkable. Surgical clips demonstrated in the abdomen. IMPRESSION: Degenerative changes in the lower lumbar spine and hips. No acute fracture or dislocation. Electronically Signed   By: Lucienne Capers M.D.   On: 11/04/2020 23:48    Procedures Reduction of dislocation  Date/Time: 11/05/2020 4:44 AM Performed by: Joanne Gavel, PA-C Authorized by: Joanne Gavel, PA-C  Preparation: Patient was prepped and draped in the usual sterile fashion. Local anesthesia used: no  Anesthesia: Local anesthesia used: no  Sedation: Patient sedated: yes (Please see Dr. Guido Sander separate note for conscious sedation. )  Patient tolerance: patient tolerated the procedure well with no immediate complications    (including critical care time)  Medications Ordered in ED Medications  propofol (DIPRIVAN) 10 mg/mL bolus/IV push (has no administration in time range)  fentaNYL (SUBLIMAZE) injection 50 mcg (50 mcg Intravenous Given 11/04/20 2216)  fentaNYL (SUBLIMAZE) injection 100 mcg (100 mcg Intravenous Given 11/04/20 2247)  ondansetron (ZOFRAN) injection 4 mg (4 mg  Intravenous Given 11/04/20 2247)  propofol (DIPRIVAN) 10 mg/mL bolus/IV push 105.2 mg (105.2 mg Intravenous Given by Other 11/05/20 0049)  0.9 %  sodium chloride infusion ( Intravenous Stopped 11/05/20 0047)  propofol (DIPRIVAN) 10 mg/mL bolus/IV push (30 mg Intravenous Given 11/05/20 0037)  HYDROcodone-acetaminophen (NORCO) 10-325 MG per tablet 1 tablet (1 tablet Oral Given 11/05/20 0209)    ED Course  I have reviewed the triage vital signs and the nursing notes.  Pertinent labs & imaging results that were available during my care of the patient were reviewed by me and considered in my medical decision making (see chart for details).  Clinical Course as of Nov 05 446  Nancy Fetter Nov 04, 2020  2255 Patient reexamined at bedside after she has been undressed and gowned.  She is currently receiving her second dose of fentanyl.  Oxygen saturation with good waveform noted to be at 89 to 90%, likely secondary to fentanyl.  She was placed on nasal cannula.  She has an IV in the left hand.  She has a new patchy, urticarial appearing rash on the dorsum of the right hand and wrist.  No pruritus at this time.  No airway involvement.  IV does not appear to have infiltrated.  We will continue to monitor to ensure the patient does not have an allergy to fentanyl.   [MM]  2309 Post reduction films viewed at bedside.  Successful reduction.  She is neurovascularly intact following reduction.   [MM]    Clinical Course User Index [MM] Pharoah Goggins, Laymond Purser, PA-C   MDM Rules/Calculators/A&P                          74 year old female with a history of paroxysmal atrial fibrillation, asthma, anemia, dyslipidemia, DJD, restless leg syndrome, tubular adenoma of colon, and obesity status post gastric bypass who presents the emergency department after a fall.  The patient fell onto her right shoulder and bilateral knees while chasing her dog at approximately 16:30.  She then had to drive 8-1/4 hours of back to her home as the  incident occurred while she was out of town.  Vital signs are stable.  She did have some very mild hypoxia following fentanyl administration and was placed on supplemental O2 by nasal cannula with resolution of hypoxia.  On exam, she has an obvious deformity of the right shoulder, but is neurovascular intact throughout the bilateral upper and lower extremities.  Upper extremity exam is otherwise unremarkable..  She does have diffuse tenderness palpation to the spine as well as the bilateral hips and knees.  The patient was seen independently evaluated by Dr. Leonides Schanz, attending physician.  Images have been reviewed and independently interpreted by me.  She has a fracture of the greater tuberosity of the right humerus as well as an anterior, inferior dislocation of the right shoulder.  Imaging is otherwise unremarkable.  Prior to shift change, Dr. Billy Fischer discussed management of this injury with Dr. Alvan Dame, orthopedics who recommended bedside reduction attempt.  Conscious sedation performed by Dr. Leonides Schanz.  Please see  her separate note for conscious sedation procedure.  Successful reduction of anterior dislocation of the right shoulder that was demonstrated on postreduction x-ray.  Patient was placed in a shoulder immobilizer.  She was counseled extensively on home use of shoulder immobilizer.  She currently has a pain contract and was given a dose of her home pain medication, which she has been advised to continue at home for pain control.  She will follow up with her orthopedist and has been advised to call his office tomorrow for follow-up in the next week.  She was ambulated in the department without difficulty prior to discharge.  She is hemodynamically stable to no acute distress.  Safe for discharge to home with outpatient follow-up as indicated.  ER return precautions given.  Final Clinical Impression(s) / ED Diagnoses Final diagnoses:  Fall, initial encounter  Traumatic closed displaced fracture  of right shoulder with anterior dislocation, initial encounter  Acute pain of both knees  Bilateral hip pain  Neck pain  Acute bilateral back pain, unspecified back location    Rx / DC Orders ED Discharge Orders    None       Lashon Beringer, Laymond Purser, PA-C 11/05/20 0447    Ward, Delice Bison, DO 11/05/20 0810    Ward, Delice Bison, DO 11/05/20 2446

## 2020-11-05 ENCOUNTER — Emergency Department (HOSPITAL_COMMUNITY): Payer: Medicare Other

## 2020-11-05 DIAGNOSIS — S43014A Anterior dislocation of right humerus, initial encounter: Secondary | ICD-10-CM | POA: Diagnosis not present

## 2020-11-05 DIAGNOSIS — M19011 Primary osteoarthritis, right shoulder: Secondary | ICD-10-CM | POA: Diagnosis not present

## 2020-11-05 DIAGNOSIS — S43004A Unspecified dislocation of right shoulder joint, initial encounter: Secondary | ICD-10-CM | POA: Diagnosis not present

## 2020-11-05 DIAGNOSIS — S42251A Displaced fracture of greater tuberosity of right humerus, initial encounter for closed fracture: Secondary | ICD-10-CM | POA: Diagnosis not present

## 2020-11-05 IMAGING — DX DG SHOULDER 2+V PORT*R*
1 series · 1 of 1 positions shown · non-contrast
Comparison: [DATE]

CLINICAL DATA: Post reduction

EXAM:
PORTABLE RIGHT SHOULDER

[shoulder ap]
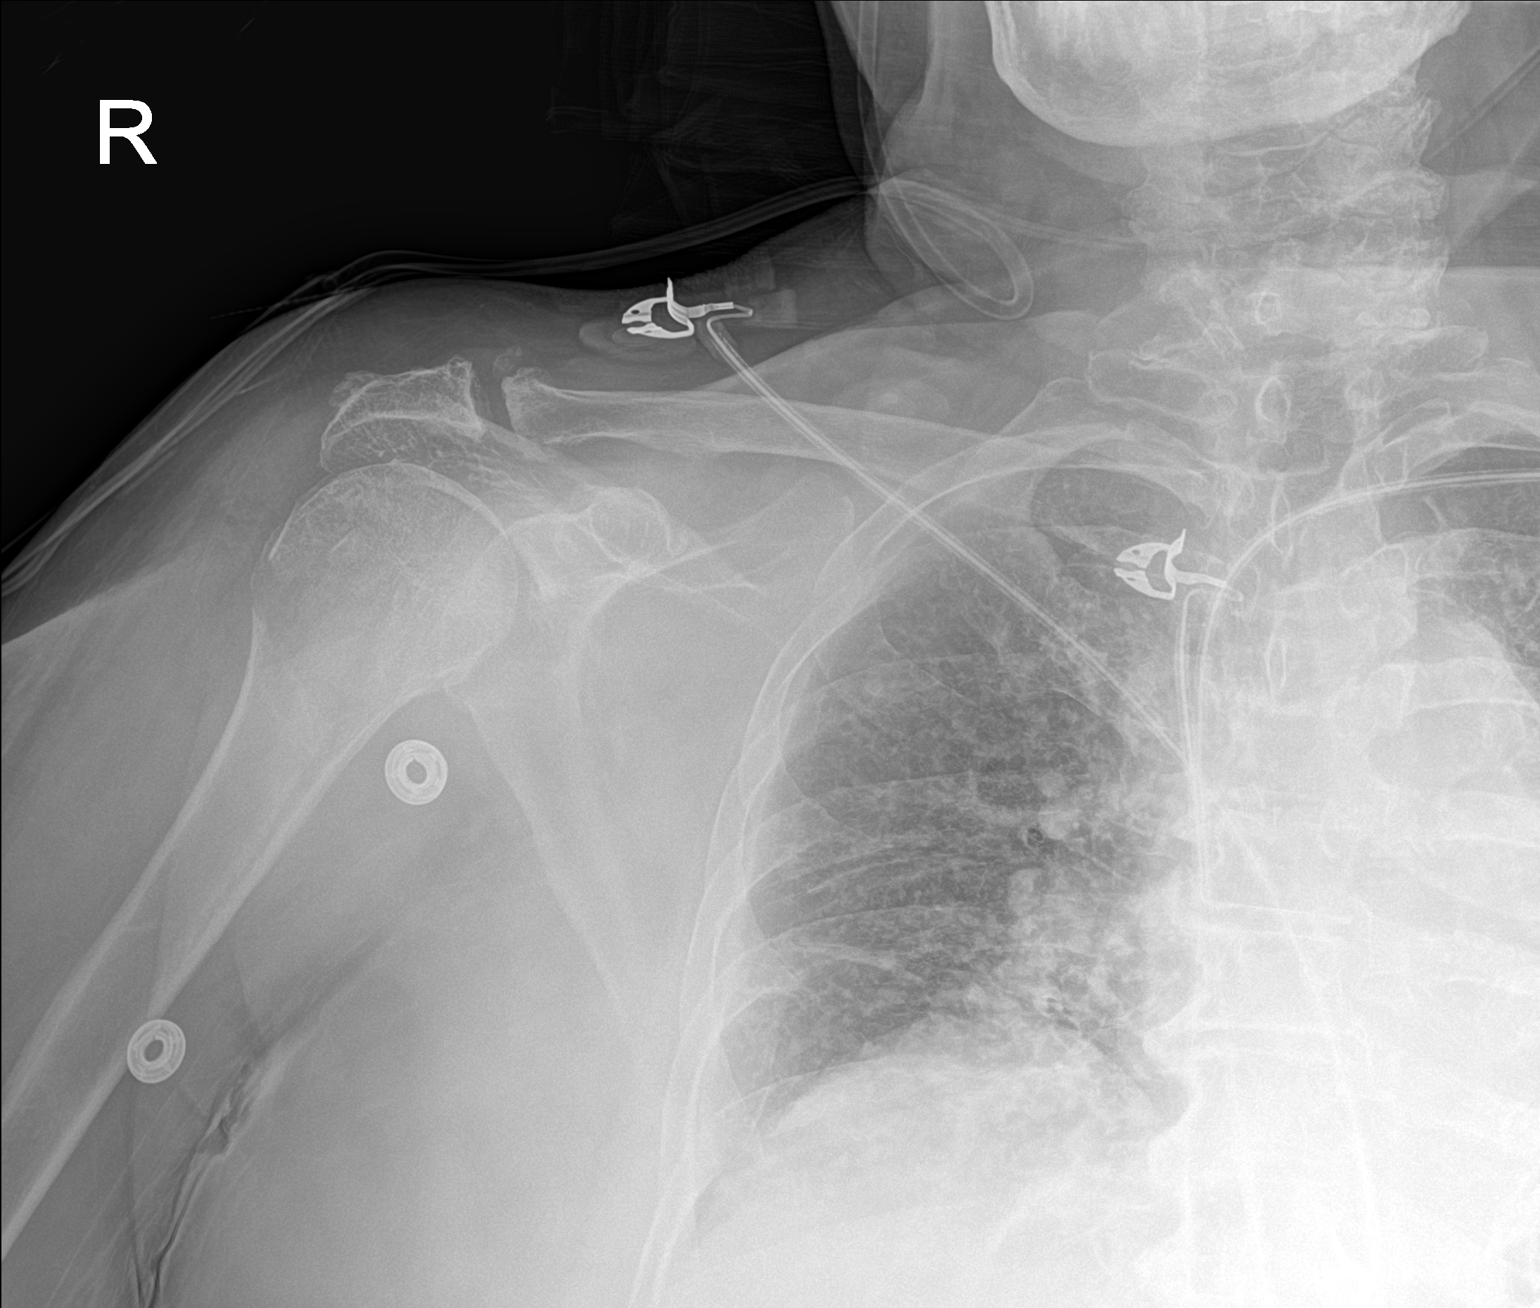

[1 of 1 positions shown; findings below may reference images not displayed]

FINDINGS: There is interval reduction of the previously demonstrated
dislocated right shoulder. Again noted is a fracture through the
greater tuberosity. There are degenerative changes of the
glenohumeral and right AC joint.
IMPRESSION: Successful reduction of the previously demonstrated dislocated right
shoulder. Persistent fracture of the greater tuberosity.

## 2020-11-05 MED ORDER — OXYCODONE-ACETAMINOPHEN 5-325 MG PO TABS
1.0000 | ORAL_TABLET | Freq: Once | ORAL | Status: DC
  Filled 2020-11-05: qty 1

## 2020-11-05 MED ORDER — PROPOFOL 10 MG/ML IV BOLUS
1.0000 mg/kg | Freq: Once | INTRAVENOUS | Status: AC
  Administered 2020-11-05: 105.2 mg via INTRAVENOUS
  Filled 2020-11-05: qty 20

## 2020-11-05 MED ORDER — HYDROCODONE-ACETAMINOPHEN 10-325 MG PO TABS
1.0000 | ORAL_TABLET | Freq: Once | ORAL | Status: AC
  Administered 2020-11-05: 1 via ORAL
  Filled 2020-11-05: qty 1

## 2020-11-05 MED ORDER — PROPOFOL 10 MG/ML IV BOLUS
INTRAVENOUS | Status: AC | PRN
  Administered 2020-11-05: 30 mg via INTRAVENOUS
  Administered 2020-11-05: 50 mg via INTRAVENOUS
  Administered 2020-11-05: 30 mg via INTRAVENOUS
  Administered 2020-11-05: 20 mg via INTRAVENOUS

## 2020-11-05 MED ORDER — SODIUM CHLORIDE 0.9 % IV SOLN
INTRAVENOUS | Status: AC | PRN
  Administered 2020-11-05: 150 mL/h via INTRAVENOUS

## 2020-11-05 MED ORDER — PROPOFOL 10 MG/ML IV BOLUS
INTRAVENOUS | Status: AC
  Filled 2020-11-05: qty 20

## 2020-11-05 NOTE — ED Provider Notes (Signed)
.  Sedation  Date/Time: 11/05/2020 12:29 AM Performed by: Kieara Schwark, Delice Bison, DO Authorized by: Graesyn Schreifels, Delice Bison, DO   Consent:    Consent obtained:  Verbal   Consent given by:  Patient   Risks discussed:  Allergic reaction, dysrhythmia, inadequate sedation, nausea, prolonged hypoxia resulting in organ damage, prolonged sedation necessitating reversal, respiratory compromise necessitating ventilatory assistance and intubation and vomiting   Alternatives discussed:  Analgesia without sedation, anxiolysis and regional anesthesia Universal protocol:    Procedure explained and questions answered to patient or proxy's satisfaction: yes     Relevant documents present and verified: yes     Test results available and properly labeled: yes     Imaging studies available: yes     Required blood products, implants, devices, and special equipment available: yes     Site/side marked: yes     Immediately prior to procedure a time out was called: yes     Patient identity confirmation method:  Verbally with patient Indications:    Procedure necessitating sedation performed by:  Physician performing sedation (reduction performed by PA while I was present for procedure) Pre-sedation assessment:    Time since last food or drink:  630pm   ASA classification: class 2 - patient with mild systemic disease     Neck mobility: normal     Mouth opening:  3 or more finger widths   Thyromental distance:  4 finger widths   Mallampati score:  II - soft palate, uvula, fauces visible   Pre-sedation assessments completed and reviewed: airway patency, cardiovascular function, hydration status, mental status, nausea/vomiting, pain level, respiratory function and temperature     Pre-sedation assessment completed:  11/05/2020 12:00 AM Immediate pre-procedure details:    Reassessment: Patient reassessed immediately prior to procedure     Reviewed: vital signs, relevant labs/tests and NPO status     Verified: bag valve mask  available, emergency equipment available, intubation equipment available, IV patency confirmed, oxygen available and suction available   Procedure details (see MAR for exact dosages):    Preoxygenation:  Nasal cannula   Sedation:  Propofol   Intended level of sedation: deep   Intra-procedure monitoring:  Blood pressure monitoring, cardiac monitor, continuous pulse oximetry, frequent LOC assessments, frequent vital sign checks and continuous capnometry   Intra-procedure events: none     Total Provider sedation time (minutes):  20 Post-procedure details:    Post-sedation assessment completed:  11/05/2020 1:30 AM   Attendance: Constant attendance by certified staff until patient recovered     Recovery: Patient returned to pre-procedure baseline     Post-sedation assessments completed and reviewed: airway patency, cardiovascular function, hydration status, mental status, nausea/vomiting, pain level, respiratory function and temperature     Patient is stable for discharge or admission: yes     Patient tolerance:  Tolerated well, no immediate complications      Leiloni Smithers, Delice Bison, DO 11/05/20 8527

## 2020-11-05 NOTE — ED Notes (Signed)
Pt ambulated down hallway with no assistance.

## 2020-11-05 NOTE — Discharge Instructions (Addendum)
Thank you for allowing me to care for you today in the Emergency Department.   You were seen today for a fall.  You have a fracture/broken bone to your right upper arm (humerus).  You had a dislocation of your shoulder that was reduced in the emergency department.  Wear the shoulder immobilizer until you are seen by orthopedist.  Take your home pain medication as prescribed.  You can also apply an ice pack for 15 to 20 minutes up to 3-4 times a day to help with pain, swelling, and bruising.  You can also try applying a lidocaine patch to areas that are painful.  The rest of your work-up was reassuring.  Return to the emergency department if you have any fall or injury, if you develop new numbness or weakness, if the arm gets very swollen and red, if your fingertips turn blue, or if you have any other new, concerning symptoms.

## 2020-11-05 NOTE — Sedation Documentation (Signed)
Time out

## 2020-11-05 NOTE — Progress Notes (Signed)
RT present for conscious sedation to relocate shoulder with BMV ready and pt attached to ETCO2 monitor.  VS remained acceptable throughout procedure and no complications to note.  Pt able to speak appropriately to staff following procedure and before RT exited.  RT will follow as necessary.

## 2020-11-05 NOTE — Progress Notes (Signed)
Orthopedic Tech Progress Note Patient Details:  [name] [birth year] [id number]  Ortho Devices Type of Ortho Device: Sling immobilizer Ortho Device/Splint Location: rue. applied post reduction. Ortho Device/Splint Interventions: Ordered, Application, Adjustment   Post Interventions Patient Tolerated: Well Instructions Provided: Care of device, Adjustment of device   Karolee Stamps 11/05/2020, 5:03 AM

## 2020-11-06 ENCOUNTER — Ambulatory Visit: Payer: Medicare Other | Admitting: Neurology

## 2020-11-07 DIAGNOSIS — S42254A Nondisplaced fracture of greater tuberosity of right humerus, initial encounter for closed fracture: Secondary | ICD-10-CM | POA: Diagnosis not present

## 2020-11-07 DIAGNOSIS — S43394A Dislocation of other parts of right shoulder girdle, initial encounter: Secondary | ICD-10-CM | POA: Diagnosis not present

## 2020-11-14 ENCOUNTER — Telehealth: Payer: Self-pay | Admitting: Neurology

## 2020-11-14 NOTE — Telephone Encounter (Signed)
Called the patient back. As of this moment, I dont see where we have any aimovig samples in stock. Patient has a upcoming apt on Tuesday for botox. I advised it may be that we might get some more between now and then but as of this moment we are out. Patient states that [employer] was sending a script to be sent in. I advised I had not seen this come through yet but that I would check on this as well.

## 2020-11-14 NOTE — Telephone Encounter (Signed)
Pt states [facility] needs a new prescription for her Erenumab-aooe (AIMOVIG) 140 MG/ML SOAJ.  Pt is asking if there is an available sample of Aimovig for her now.  Please call.

## 2020-11-15 NOTE — Telephone Encounter (Signed)
Dr [name] did agree to providing the patient a sample and there is now a sample available with her name on it in the fridge to be given. She can get this at her upcoming apt tuesday

## 2020-11-19 MED ORDER — AIMOVIG 140 MG/ML ~~LOC~~ SOAJ: 140.0000 mg | SUBCUTANEOUS | 1 refills | Status: DC

## 2020-11-19 NOTE — Addendum Note (Signed)
Addended by: Gildardo Griffes on: 11/19/2020 09:05 AM   Modules accepted: Orders

## 2020-11-19 NOTE — Telephone Encounter (Signed)
Noted. Patient needs to fill out a new application for aimovig assistance for 2022. She can do this in the office on Tuesday.

## 2020-11-20 ENCOUNTER — Ambulatory Visit (INDEPENDENT_AMBULATORY_CARE_PROVIDER_SITE_OTHER): Payer: Medicare Other | Admitting: Neurology

## 2020-11-20 DIAGNOSIS — G43711 Chronic migraine without aura, intractable, with status migrainosus: Secondary | ICD-10-CM | POA: Diagnosis not present

## 2020-11-20 DIAGNOSIS — R1115 Cyclical vomiting syndrome unrelated to migraine: Secondary | ICD-10-CM

## 2020-11-20 DIAGNOSIS — G43001 Migraine without aura, not intractable, with status migrainosus: Secondary | ICD-10-CM

## 2020-11-20 MED ORDER — ONDANSETRON 4 MG PO TBDP: ORAL_TABLET | ORAL | 2 refills | Status: DC

## 2020-11-20 NOTE — Progress Notes (Signed)
Consent Form Botulism Toxin Injection For Chronic Migraine  Interval history 11/20/2020: Patient's migraines improved on botox > 80% in and frequency. +5 orb Oculi an d+5 masseters each  Reviewed orally with patient, additionally signature is on file:  Botulism toxin has been approved by the Federal drug administration for treatment of chronic migraine. Botulism toxin does not cure chronic migraine and it may not be effective in some patients.  The administration of botulism toxin is accomplished by injecting a small amount of toxin into the muscles of the neck and head. Dosage must be titrated for each individual. Any benefits resulting from botulism toxin tend to wear off after 3 months with a repeat injection required if benefit is to be maintained. Injections are usually done every 3-4 months with maximum effect peak achieved by about 2 or 3 weeks. Botulism toxin is expensive and you should be sure of what costs you will incur resulting from the injection.  The side effects of botulism toxin use for chronic migraine may include:   -Transient, and usually mild, facial weakness with facial injections  -Transient, and usually mild, head or neck weakness with head/neck injections  -Reduction or loss of forehead facial animation due to forehead muscle weakness  -Eyelid drooping  -Dry eye  -Pain at the site of injection or bruising at the site of injection  -Double vision  -Potential unknown long term risks  Contraindications: You should not have Botox if you are pregnant, nursing, allergic to albumin, have an infection, skin condition, or muscle weakness at the site of the injection, or have myasthenia gravis, Lambert-Eaton syndrome, or ALS.  It is also possible that as with any injection, there may be an allergic reaction or no effect from the medication. Reduced effectiveness after repeated injections is sometimes seen and rarely infection at the injection site may occur. All care will be  taken to prevent these side effects. If therapy is given over a long time, atrophy and wasting in the muscle injected may occur. Occasionally the patient's become refractory to treatment because they develop antibodies to the toxin. In this event, therapy needs to be modified.  I have read the above information and consent to the administration of botulism toxin.    BOTOX PROCEDURE NOTE FOR MIGRAINE HEADACHE    Contraindications and precautions discussed with patient(above). Aseptic procedure was observed and patient tolerated procedure. Procedure performed by Dr. Georgia Dom  The condition has existed for more than 6 months, and pt does not have a diagnosis of ALS, Myasthenia Gravis or Lambert-Eaton Syndrome.  Risks and benefits of injections discussed and pt agrees to proceed with the procedure.  Written consent obtained  These injections are medically necessary. Pt  receives good benefits from these injections. These injections do not cause sedations or hallucinations which the oral therapies may cause.  Description of procedure:  The patient was placed in a sitting position. The standard protocol was used for Botox as follows, with 5 units of Botox injected at each site:   -Procerus muscle, midline injection  -Corrugator muscle, bilateral injection  -Frontalis muscle, bilateral injection, with 2 sites each side, medial injection was performed in the upper one third of the frontalis muscle, in the region vertical from the medial inferior edge of the superior orbital rim. The lateral injection was again in the upper one third of the forehead vertically above the lateral limbus of the cornea, 1.5 cm lateral to the medial injection site.  -Temporalis muscle injection, 5 sites, bilaterally.  The first injection was 3 cm above the tragus of the ear, second injection site was 1.5 cm to 3 cm up from the first injection site in line with the tragus of the ear. The third injection site was 1.5-3  cm forward between the first 2 injection sites. The fourth injection site was 1.5 cm posterior to the second injection site. 5th site laterally in the temporalis  muscleat the level of the outer canthus.  - Patient feels her clenching is a trigger for headaches. +5 units masseter bilaterally   - Patient feels the migraines are centered around the eyes +5 units bilaterally at the outer canthus in the orbicularis occuli  -Occipitalis muscle injection, 3 sites, bilaterally. The first injection was done one half way between the occipital protuberance and the tip of the mastoid process behind the ear. The second injection site was done lateral and superior to the first, 1 fingerbreadth from the first injection. The third injection site was 1 fingerbreadth superiorly and medially from the first injection site.  -Cervical paraspinal muscle injection, 2 sites, bilateral knee first injection site was 1 cm from the midline of the cervical spine, 3 cm inferior to the lower border of the occipital protuberance. The second injection site was 1.5 cm superiorly and laterally to the first injection site.  -Trapezius muscle injection was performed at 3 sites, bilaterally. The first injection site was in the upper trapezius muscle halfway between the inflection point of the neck, and the acromion. The second injection site was one half way between the acromion and the first injection site. The third injection was done between the first injection site and the inflection point of the neck.   Will return for repeat injection in 3 months.   A 200 unit sof Botox was used, any Botox not injected was wasted. The patient tolerated the procedure well, there were no complications of the above procedure.

## 2020-11-20 NOTE — Telephone Encounter (Signed)
Pt completed and signed [id number] application today for Aimovig assistance. Physician prescription portion completed, signed by Dr [name], then faxed to [facility]. Received a receipt of confirmation.

## 2020-11-20 NOTE — Telephone Encounter (Signed)
[facility]) called in regard to [employer] application.  Informed her patient will fill out new application at today's appt.

## 2020-11-20 NOTE — Addendum Note (Signed)
Addended by: Gildardo Griffes on: 11/20/2020 02:49 PM   Modules accepted: Orders

## 2020-11-20 NOTE — Progress Notes (Signed)
Botox- 200 units x 1 vial Lot: [id number] Expiration: 05/2023 NDC: 0511-0211-17  Bacteriostatic 0.9% Sodium Chloride- 10mL total Lot: [id number] Expiration: 01/15/22 NDC: 4103-0131-43  Dx: O88.757 B/B

## 2020-11-28 ENCOUNTER — Other Ambulatory Visit: Payer: Self-pay | Admitting: Neurology

## 2020-11-28 DIAGNOSIS — S42254A Nondisplaced fracture of greater tuberosity of right humerus, initial encounter for closed fracture: Secondary | ICD-10-CM | POA: Diagnosis not present

## 2020-11-28 DIAGNOSIS — S43394A Dislocation of other parts of right shoulder girdle, initial encounter: Secondary | ICD-10-CM | POA: Diagnosis not present

## 2020-11-29 NOTE — Telephone Encounter (Signed)
Received a fax from [place] stating PA is missing and patient need to provide proof of household income. PA was already faxed to [place]. I called [employer] and spoke with the representative who confirmed they did have the PA already for 2022 and the patient can call [employer] to discuss ways to provide proof of income.   I called the patient. She will call [employer] to provide proof of income.

## 2020-11-30 DIAGNOSIS — M25611 Stiffness of right shoulder, not elsewhere classified: Secondary | ICD-10-CM | POA: Diagnosis not present

## 2020-12-05 DIAGNOSIS — M25611 Stiffness of right shoulder, not elsewhere classified: Secondary | ICD-10-CM | POA: Diagnosis not present

## 2020-12-11 DIAGNOSIS — M25611 Stiffness of right shoulder, not elsewhere classified: Secondary | ICD-10-CM | POA: Diagnosis not present

## 2020-12-17 DIAGNOSIS — M25611 Stiffness of right shoulder, not elsewhere classified: Secondary | ICD-10-CM | POA: Diagnosis not present

## 2020-12-18 DIAGNOSIS — M25511 Pain in right shoulder: Secondary | ICD-10-CM | POA: Diagnosis not present

## 2020-12-21 ENCOUNTER — Other Ambulatory Visit: Payer: Self-pay | Admitting: Neurology

## 2020-12-25 DIAGNOSIS — Z23 Encounter for immunization: Secondary | ICD-10-CM | POA: Diagnosis not present

## 2020-12-26 DIAGNOSIS — M75121 Complete rotator cuff tear or rupture of right shoulder, not specified as traumatic: Secondary | ICD-10-CM | POA: Diagnosis not present

## 2021-01-02 DIAGNOSIS — G5601 Carpal tunnel syndrome, right upper limb: Secondary | ICD-10-CM | POA: Diagnosis not present

## 2021-01-02 DIAGNOSIS — G5690 Unspecified mononeuropathy of unspecified upper limb: Secondary | ICD-10-CM | POA: Diagnosis not present

## 2021-01-28 ENCOUNTER — Other Ambulatory Visit: Payer: Self-pay | Admitting: Neurology

## 2021-02-13 DIAGNOSIS — M75121 Complete rotator cuff tear or rupture of right shoulder, not specified as traumatic: Secondary | ICD-10-CM | POA: Diagnosis not present

## 2021-02-14 DIAGNOSIS — Z79899 Other long term (current) drug therapy: Secondary | ICD-10-CM | POA: Diagnosis not present

## 2021-02-14 DIAGNOSIS — R739 Hyperglycemia, unspecified: Secondary | ICD-10-CM | POA: Diagnosis not present

## 2021-02-14 DIAGNOSIS — E559 Vitamin D deficiency, unspecified: Secondary | ICD-10-CM | POA: Diagnosis not present

## 2021-02-14 DIAGNOSIS — I10 Essential (primary) hypertension: Secondary | ICD-10-CM | POA: Diagnosis not present

## 2021-02-14 DIAGNOSIS — E538 Deficiency of other specified B group vitamins: Secondary | ICD-10-CM | POA: Diagnosis not present

## 2021-02-14 DIAGNOSIS — D509 Iron deficiency anemia, unspecified: Secondary | ICD-10-CM | POA: Diagnosis not present

## 2021-02-14 DIAGNOSIS — E7800 Pure hypercholesterolemia, unspecified: Secondary | ICD-10-CM | POA: Diagnosis not present

## 2021-02-18 DIAGNOSIS — E7800 Pure hypercholesterolemia, unspecified: Secondary | ICD-10-CM | POA: Diagnosis not present

## 2021-02-18 DIAGNOSIS — G4700 Insomnia, unspecified: Secondary | ICD-10-CM | POA: Diagnosis not present

## 2021-02-18 DIAGNOSIS — E538 Deficiency of other specified B group vitamins: Secondary | ICD-10-CM | POA: Diagnosis not present

## 2021-02-18 DIAGNOSIS — J45909 Unspecified asthma, uncomplicated: Secondary | ICD-10-CM | POA: Diagnosis not present

## 2021-02-18 DIAGNOSIS — M75100 Unspecified rotator cuff tear or rupture of unspecified shoulder, not specified as traumatic: Secondary | ICD-10-CM | POA: Diagnosis not present

## 2021-02-18 DIAGNOSIS — J329 Chronic sinusitis, unspecified: Secondary | ICD-10-CM | POA: Diagnosis not present

## 2021-02-18 DIAGNOSIS — Z8673 Personal history of transient ischemic attack (TIA), and cerebral infarction without residual deficits: Secondary | ICD-10-CM | POA: Diagnosis not present

## 2021-02-18 DIAGNOSIS — G43909 Migraine, unspecified, not intractable, without status migrainosus: Secondary | ICD-10-CM | POA: Diagnosis not present

## 2021-02-18 DIAGNOSIS — M109 Gout, unspecified: Secondary | ICD-10-CM | POA: Diagnosis not present

## 2021-02-18 DIAGNOSIS — I10 Essential (primary) hypertension: Secondary | ICD-10-CM | POA: Diagnosis not present

## 2021-02-18 DIAGNOSIS — K130 Diseases of lips: Secondary | ICD-10-CM | POA: Diagnosis not present

## 2021-02-18 DIAGNOSIS — M47816 Spondylosis without myelopathy or radiculopathy, lumbar region: Secondary | ICD-10-CM | POA: Diagnosis not present

## 2021-02-19 ENCOUNTER — Ambulatory Visit (INDEPENDENT_AMBULATORY_CARE_PROVIDER_SITE_OTHER): Payer: Medicare Other | Admitting: Neurology

## 2021-02-19 ENCOUNTER — Telehealth: Payer: Self-pay | Admitting: Neurology

## 2021-02-19 DIAGNOSIS — E611 Iron deficiency: Secondary | ICD-10-CM | POA: Diagnosis not present

## 2021-02-19 DIAGNOSIS — G43711 Chronic migraine without aura, intractable, with status migrainosus: Secondary | ICD-10-CM | POA: Diagnosis not present

## 2021-02-19 MED ORDER — ERENUMAB-AOOE 140 MG/ML ~~LOC~~ SOAJ: 140.0000 mg | Freq: Once | SUBCUTANEOUS | Status: DC

## 2021-02-19 NOTE — Telephone Encounter (Signed)
Dr.Ahern went ahead and ordered the lab work today to verify while the patient was here.

## 2021-02-19 NOTE — Progress Notes (Signed)
Consent Form Botulism Toxin Injection For Chronic Migraine  Interval history 02/19/2021: Patient's migraines improved on botox > 80% in and frequency. +5 orb Oculi an d+5 masseters each. Gave her 5 months of aimovig.   Reviewed orally with patient, additionally signature is on file:  Botulism toxin has been approved by the Federal drug administration for treatment of chronic migraine. Botulism toxin does not cure chronic migraine and it may not be effective in some patients.  The administration of botulism toxin is accomplished by injecting a small amount of toxin into the muscles of the neck and head. Dosage must be titrated for each individual. Any benefits resulting from botulism toxin tend to wear off after 3 months with a repeat injection required if benefit is to be maintained. Injections are usually done every 3-4 months with maximum effect peak achieved by about 2 or 3 weeks. Botulism toxin is expensive and you should be sure of what costs you will incur resulting from the injection.  The side effects of botulism toxin use for chronic migraine may include:   -Transient, and usually mild, facial weakness with facial injections  -Transient, and usually mild, head or neck weakness with head/neck injections  -Reduction or loss of forehead facial animation due to forehead muscle weakness  -Eyelid drooping  -Dry eye  -Pain at the site of injection or bruising at the site of injection  -Double vision  -Potential unknown long term risks  Contraindications: You should not have Botox if you are pregnant, nursing, allergic to albumin, have an infection, skin condition, or muscle weakness at the site of the injection, or have myasthenia gravis, Lambert-Eaton syndrome, or ALS.  It is also possible that as with any injection, there may be an allergic reaction or no effect from the medication. Reduced effectiveness after repeated injections is sometimes seen and rarely infection at the injection  site may occur. All care will be taken to prevent these side effects. If therapy is given over a long time, atrophy and wasting in the muscle injected may occur. Occasionally the patient's become refractory to treatment because they develop antibodies to the toxin. In this event, therapy needs to be modified.  I have read the above information and consent to the administration of botulism toxin.    BOTOX PROCEDURE NOTE FOR MIGRAINE HEADACHE    Contraindications and precautions discussed with patient(above). Aseptic procedure was observed and patient tolerated procedure. Procedure performed by Dr. Georgia Dom  The condition has existed for more than 6 months, and pt does not have a diagnosis of ALS, Myasthenia Gravis or Lambert-Eaton Syndrome.  Risks and benefits of injections discussed and pt agrees to proceed with the procedure.  Written consent obtained  These injections are medically necessary. Pt  receives good benefits from these injections. These injections do not cause sedations or hallucinations which the oral therapies may cause.  Description of procedure:  The patient was placed in a sitting position. The standard protocol was used for Botox as follows, with 5 units of Botox injected at each site:   -Procerus muscle, midline injection  -Corrugator muscle, bilateral injection  -Frontalis muscle, bilateral injection, with 2 sites each side, medial injection was performed in the upper one third of the frontalis muscle, in the region vertical from the medial inferior edge of the superior orbital rim. The lateral injection was again in the upper one third of the forehead vertically above the lateral limbus of the cornea, 1.5 cm lateral to the medial injection site.  -  Temporalis muscle injection, 5 sites, bilaterally. The first injection was 3 cm above the tragus of the ear, second injection site was 1.5 cm to 3 cm up from the first injection site in line with the tragus of the ear. The  third injection site was 1.5-3 cm forward between the first 2 injection sites. The fourth injection site was 1.5 cm posterior to the second injection site. 5th site laterally in the temporalis  muscleat the level of the outer canthus.  - Patient feels her clenching is a trigger for headaches. +5 units masseter bilaterally   - Patient feels the migraines are centered around the eyes +5 units bilaterally at the outer canthus in the orbicularis occuli  -Occipitalis muscle injection, 3 sites, bilaterally. The first injection was done one half way between the occipital protuberance and the tip of the mastoid process behind the ear. The second injection site was done lateral and superior to the first, 1 fingerbreadth from the first injection. The third injection site was 1 fingerbreadth superiorly and medially from the first injection site.  -Cervical paraspinal muscle injection, 2 sites, bilateral knee first injection site was 1 cm from the midline of the cervical spine, 3 cm inferior to the lower border of the occipital protuberance. The second injection site was 1.5 cm superiorly and laterally to the first injection site.  -Trapezius muscle injection was performed at 3 sites, bilaterally. The first injection site was in the upper trapezius muscle halfway between the inflection point of the neck, and the acromion. The second injection site was one half way between the acromion and the first injection site. The third injection was done between the first injection site and the inflection point of the neck.   Will return for repeat injection in 3 months.   A 200 unit sof Botox was used, any Botox not injected was wasted. The patient tolerated the procedure well, there were no complications of the above procedure.

## 2021-02-19 NOTE — Progress Notes (Signed)
Botox- 200 units x 1 vial Lot: [id number] Expiration: 10/2023 NDC: 0023-3921-02  Bacteriostatic 0.9% Sodium Chloride- 4mL total Lot: [id number] Expiration: 01/15/2022 NDC: 0409-1966-02  Dx: G43.711 B/B  

## 2021-02-19 NOTE — Telephone Encounter (Signed)
Patient here today to see Dr. [name]. She wanted to know if she needs an apt with Dr. Brett Fairy for her ferritin levels to be checked. Best call back is [phone]

## 2021-02-20 LAB — FERRITIN: Ferritin: 31 ng/mL (ref 15–150)

## 2021-02-20 LAB — IRON AND TIBC
Iron Saturation: 17 % (ref 15–55)
Iron: 58 ug/dL (ref 27–139)
Total Iron Binding Capacity: 344 ug/dL (ref 250–450)
UIBC: 286 ug/dL (ref 118–369)

## 2021-02-20 NOTE — Progress Notes (Signed)
Thank you, Dr [name], Slowly sinking iron saturation to 17%. Still in (low) normal limits. Ferritin is under 50, at 31 and has been there for the last 2 years.  Id symptoms require a repeat iron infusion, lets do one, -if not ,rechecking levels in another 6 month is indicated.

## 2021-02-20 NOTE — Telephone Encounter (Signed)
Patient had inquired about an orthopedic recommendation.  I have called her with Dr. Coolidge Breeze response.  While having the patient on the phone I reviewed her iron and ferritin levels with her.  Advised that if Dr. Brett Fairy had any recommendations that I would call her back.  Scheduled patient for a follow-up visit in July for her yearly check up.

## 2021-02-22 DIAGNOSIS — S43014D Anterior dislocation of right humerus, subsequent encounter: Secondary | ICD-10-CM | POA: Diagnosis not present

## 2021-02-22 DIAGNOSIS — S4431XD Injury of axillary nerve, right arm, subsequent encounter: Secondary | ICD-10-CM | POA: Diagnosis not present

## 2021-02-22 DIAGNOSIS — M6281 Muscle weakness (generalized): Secondary | ICD-10-CM | POA: Diagnosis not present

## 2021-03-07 DIAGNOSIS — S43014D Anterior dislocation of right humerus, subsequent encounter: Secondary | ICD-10-CM | POA: Diagnosis not present

## 2021-03-07 DIAGNOSIS — S4431XD Injury of axillary nerve, right arm, subsequent encounter: Secondary | ICD-10-CM | POA: Diagnosis not present

## 2021-03-07 DIAGNOSIS — M6281 Muscle weakness (generalized): Secondary | ICD-10-CM | POA: Diagnosis not present

## 2021-03-08 DIAGNOSIS — S43014D Anterior dislocation of right humerus, subsequent encounter: Secondary | ICD-10-CM | POA: Diagnosis not present

## 2021-03-08 DIAGNOSIS — M6281 Muscle weakness (generalized): Secondary | ICD-10-CM | POA: Diagnosis not present

## 2021-03-08 DIAGNOSIS — S4431XD Injury of axillary nerve, right arm, subsequent encounter: Secondary | ICD-10-CM | POA: Diagnosis not present

## 2021-03-11 DIAGNOSIS — M6281 Muscle weakness (generalized): Secondary | ICD-10-CM | POA: Diagnosis not present

## 2021-03-11 DIAGNOSIS — S4431XD Injury of axillary nerve, right arm, subsequent encounter: Secondary | ICD-10-CM | POA: Diagnosis not present

## 2021-03-11 DIAGNOSIS — S43014D Anterior dislocation of right humerus, subsequent encounter: Secondary | ICD-10-CM | POA: Diagnosis not present

## 2021-03-12 DIAGNOSIS — I8393 Asymptomatic varicose veins of bilateral lower extremities: Secondary | ICD-10-CM | POA: Diagnosis not present

## 2021-03-12 DIAGNOSIS — R208 Other disturbances of skin sensation: Secondary | ICD-10-CM | POA: Diagnosis not present

## 2021-03-12 DIAGNOSIS — B370 Candidal stomatitis: Secondary | ICD-10-CM | POA: Diagnosis not present

## 2021-03-15 DIAGNOSIS — S43014D Anterior dislocation of right humerus, subsequent encounter: Secondary | ICD-10-CM | POA: Diagnosis not present

## 2021-03-15 DIAGNOSIS — S4431XD Injury of axillary nerve, right arm, subsequent encounter: Secondary | ICD-10-CM | POA: Diagnosis not present

## 2021-03-15 DIAGNOSIS — M6281 Muscle weakness (generalized): Secondary | ICD-10-CM | POA: Diagnosis not present

## 2021-03-19 DIAGNOSIS — M6281 Muscle weakness (generalized): Secondary | ICD-10-CM | POA: Diagnosis not present

## 2021-03-19 DIAGNOSIS — S4431XD Injury of axillary nerve, right arm, subsequent encounter: Secondary | ICD-10-CM | POA: Diagnosis not present

## 2021-03-19 DIAGNOSIS — S43014D Anterior dislocation of right humerus, subsequent encounter: Secondary | ICD-10-CM | POA: Diagnosis not present

## 2021-03-21 DIAGNOSIS — S4431XD Injury of axillary nerve, right arm, subsequent encounter: Secondary | ICD-10-CM | POA: Diagnosis not present

## 2021-03-21 DIAGNOSIS — M6281 Muscle weakness (generalized): Secondary | ICD-10-CM | POA: Diagnosis not present

## 2021-03-21 DIAGNOSIS — S43014D Anterior dislocation of right humerus, subsequent encounter: Secondary | ICD-10-CM | POA: Diagnosis not present

## 2021-03-22 ENCOUNTER — Ambulatory Visit (INDEPENDENT_AMBULATORY_CARE_PROVIDER_SITE_OTHER): Payer: Medicare Other | Admitting: Podiatry

## 2021-03-22 ENCOUNTER — Other Ambulatory Visit: Payer: Self-pay

## 2021-03-22 DIAGNOSIS — M779 Enthesopathy, unspecified: Secondary | ICD-10-CM

## 2021-03-22 DIAGNOSIS — M778 Other enthesopathies, not elsewhere classified: Secondary | ICD-10-CM

## 2021-03-22 MED ORDER — TRIAMCINOLONE ACETONIDE 10 MG/ML IJ SUSP
10.0000 mg | Freq: Once | INTRAMUSCULAR | Status: AC
  Administered 2021-03-22: 20 mg

## 2021-03-22 NOTE — Progress Notes (Signed)
Subjective:   Patient ID: [name], female   DOB: 75 y.o.   MRN: [id number]   HPI Patient presents with quite a bit of pain on the dorsum of both feet and then moderate pain distal but the pain on the top is the worst with pain in both feet but generalized   ROS      Objective:  Physical Exam  Neurovascular status intact with extensor tendinitis noted bilateral dorsal surface and moderate discomfort into the lesser MPJs     Assessment:  Extensor tendinitis bilateral with capsulitis     Plan:  H&P and reviewed condition and did sterile prep and injected the extensor complex bilateral 3 mg Kenalog 5 mg Xylocaine advised on soaks therapy and reappoint as symptoms indicate may require other treatment options

## 2021-03-27 DIAGNOSIS — S43014D Anterior dislocation of right humerus, subsequent encounter: Secondary | ICD-10-CM | POA: Diagnosis not present

## 2021-03-27 DIAGNOSIS — M6281 Muscle weakness (generalized): Secondary | ICD-10-CM | POA: Diagnosis not present

## 2021-03-27 DIAGNOSIS — M75121 Complete rotator cuff tear or rupture of right shoulder, not specified as traumatic: Secondary | ICD-10-CM | POA: Diagnosis not present

## 2021-03-27 DIAGNOSIS — S4431XD Injury of axillary nerve, right arm, subsequent encounter: Secondary | ICD-10-CM | POA: Diagnosis not present

## 2021-03-29 ENCOUNTER — Other Ambulatory Visit: Payer: Self-pay | Admitting: Neurology

## 2021-04-03 ENCOUNTER — Telehealth: Payer: Self-pay

## 2021-04-03 NOTE — Telephone Encounter (Signed)
I submitted a PA request on CMM, Key: [id number] - PA Case ID: [id number].  Awaiting determination from [employer]

## 2021-04-04 ENCOUNTER — Other Ambulatory Visit: Payer: Self-pay | Admitting: Orthopedic Surgery

## 2021-04-04 ENCOUNTER — Telehealth: Payer: Self-pay | Admitting: *Deleted

## 2021-04-04 DIAGNOSIS — Z01811 Encounter for preprocedural respiratory examination: Secondary | ICD-10-CM

## 2021-04-04 NOTE — Telephone Encounter (Signed)
Pt is agreeable to needing a pre op appt per pre op team. Pt has been scheduled to see Dr. Tamala Julian 04/10/21 @ 11:40 ok per Marveen Reeks, RN to use time slot. I will forward notes to MD for upcoming appt . Will send FYI to requesting office pt has appt 04/10/21.

## 2021-04-04 NOTE — Telephone Encounter (Signed)
   [facility] Pre-operative Risk Assessment    Patient Name: [name]  DOB: 1946-07-10  MRN: [id number]   HEARTCARE STAFF: - Please ensure there is not already an duplicate clearance open for this procedure. - Under Visit Info/Reason for Call, type in Other and utilize the format Clearance MM/DD/YY or Clearance TBD. Do not use dashes or single digits. - If request is for dental extraction, please clarify the # of teeth to be extracted.  Request for surgical clearance:  1. What type of surgery is being performed? RIGHT REVERSE TOTAL SHOULDER ARTHROPLASTY  2. When is this surgery scheduled? 04/25/21   3. What type of clearance is required (medical clearance vs. Pharmacy clearance to hold med vs. Both)? MEDICAL  4. Are there any medications that need to be held prior to surgery and how long? ASA    5. Practice name and name of physician performing surgery? [facility]; DR. Larkin Ina [name]   6. What is the office phone number? [phone]   7.   What is the office fax number? [phone] ATTN: [name]  8.   Anesthesia type (None, local, MAC, general) ? CHOICE   [name] 04/04/2021, 3:10 PM  _________________________________________________________________   (provider comments below)

## 2021-04-04 NOTE — Telephone Encounter (Signed)
PA was denied because the medication is not FDA approved for leg cramps.  We have attempted multiple times to get approved without success. Pt has been getting it from where she is able to get the medication for reasonable price.

## 2021-04-04 NOTE — Telephone Encounter (Signed)
   Name: [name]  DOB: [birth year]  MRN: [id number]  Primary Cardiologist: Sinclair Grooms, MD  Chart reviewed as part of pre-operative protocol coverage. Because of [name]'s past medical history and time since last visit, she will require a follow-up visit in order to better assess preoperative cardiovascular risk.  Pre-op covering staff: - Please schedule appointment and call patient to inform them. If patient already had an upcoming appointment within acceptable timeframe, please add "pre-op clearance" to the appointment notes so provider is aware. - Please contact requesting surgeon's office via preferred method (i.e, phone, fax) to inform them of need for appointment prior to surgery.  If applicable, this message will also be routed to pharmacy pool and/or primary cardiologist for input on holding anticoagulant/antiplatelet agent as requested below so that this information is available to the clearing provider at time of patient's appointment.   [place], [place]  04/04/2021, 4:23 PM

## 2021-04-05 ENCOUNTER — Encounter: Payer: Self-pay | Admitting: Podiatry

## 2021-04-05 ENCOUNTER — Ambulatory Visit (INDEPENDENT_AMBULATORY_CARE_PROVIDER_SITE_OTHER): Payer: Medicare Other | Admitting: Podiatry

## 2021-04-05 ENCOUNTER — Other Ambulatory Visit: Payer: Self-pay

## 2021-04-05 DIAGNOSIS — I10 Essential (primary) hypertension: Secondary | ICD-10-CM | POA: Insufficient documentation

## 2021-04-05 DIAGNOSIS — K130 Diseases of lips: Secondary | ICD-10-CM | POA: Insufficient documentation

## 2021-04-05 DIAGNOSIS — M47816 Spondylosis without myelopathy or radiculopathy, lumbar region: Secondary | ICD-10-CM | POA: Insufficient documentation

## 2021-04-05 DIAGNOSIS — R209 Unspecified disturbances of skin sensation: Secondary | ICD-10-CM | POA: Insufficient documentation

## 2021-04-05 DIAGNOSIS — E7800 Pure hypercholesterolemia, unspecified: Secondary | ICD-10-CM | POA: Insufficient documentation

## 2021-04-05 DIAGNOSIS — M109 Gout, unspecified: Secondary | ICD-10-CM | POA: Insufficient documentation

## 2021-04-05 DIAGNOSIS — G43909 Migraine, unspecified, not intractable, without status migrainosus: Secondary | ICD-10-CM | POA: Insufficient documentation

## 2021-04-05 DIAGNOSIS — J3089 Other allergic rhinitis: Secondary | ICD-10-CM | POA: Insufficient documentation

## 2021-04-05 DIAGNOSIS — E538 Deficiency of other specified B group vitamins: Secondary | ICD-10-CM | POA: Insufficient documentation

## 2021-04-05 DIAGNOSIS — F909 Attention-deficit hyperactivity disorder, unspecified type: Secondary | ICD-10-CM | POA: Insufficient documentation

## 2021-04-05 DIAGNOSIS — M76812 Anterior tibial syndrome, left leg: Secondary | ICD-10-CM | POA: Diagnosis not present

## 2021-04-05 DIAGNOSIS — M778 Other enthesopathies, not elsewhere classified: Secondary | ICD-10-CM | POA: Diagnosis not present

## 2021-04-05 DIAGNOSIS — Z8673 Personal history of transient ischemic attack (TIA), and cerebral infarction without residual deficits: Secondary | ICD-10-CM | POA: Insufficient documentation

## 2021-04-05 DIAGNOSIS — Z79899 Other long term (current) drug therapy: Secondary | ICD-10-CM | POA: Insufficient documentation

## 2021-04-05 DIAGNOSIS — M75100 Unspecified rotator cuff tear or rupture of unspecified shoulder, not specified as traumatic: Secondary | ICD-10-CM | POA: Insufficient documentation

## 2021-04-05 DIAGNOSIS — E559 Vitamin D deficiency, unspecified: Secondary | ICD-10-CM | POA: Insufficient documentation

## 2021-04-05 MED ORDER — TRIAMCINOLONE ACETONIDE 10 MG/ML IJ SUSP
20.0000 mg | Freq: Once | INTRAMUSCULAR | Status: AC
  Administered 2021-04-05: 20 mg

## 2021-04-09 NOTE — Progress Notes (Signed)
Subjective:   Patient ID: [name], female   DOB: 75 y.o.   MRN: [id number]   HPI Patient presents stating that she does have some improvement stating she is still having some discomfort.  Patient's right foot has inflammation around the fifth metatarsal head   ROS      Objective:  Physical Exam  Neurovascular status intact with inflammation around his left anterior tibial tendon at insertion base of first metatarsal cuneiform with the right showing inflammation around the fifth MPJ with fluid buildup     Assessment:  Anterior tibial tendinitis left with capsulitis around the fifth MPJ right with pain     Plan:  H&P reviewed condition and went ahead did sterile prep and injected the tendon complex left 3 mg Dexasone Kenalog 5 mg Xylocaine and for the right I did sterile prep and injected the fifth MPJ 3 mg Dexasone Kenalog 5 mg Xylocaine.  Reappoint for [place] to recheck

## 2021-04-09 NOTE — Progress Notes (Signed)
Cardiology Office Note:    Date:  04/10/2021   ID:  [name], DOB [birth year], MRN [id number]  PCP:  [name], DO  Cardiologist:  Sinclair Grooms, MD   Referring MD: [name], DO   No chief complaint on file.   History of Present Illness:    [name] is a 75 y.o. female with a hx of obstructive sleep apnea,severe hyperlipidemia on Repatha,anomalous origin of the right coronary, secondary pulmonary hypertension, lower extremity swelling, recent CVA, and Loop recorder implantation (no documented A Fib).  She fell and fractured the right shoulder.  She had pre-existing rotator cuff trouble.  She has upcoming shoulder replacement surgery by Dr. Tamera Punt.  She is here to get preoperative clearance.  She has had some difficulty with balance.  Her legs are weak.  She is exercising, walking 2 miles 4 to 5 days/week.  She gets short of breath but does not have chest pain.  She has been doing this for several weeks now.  She is able to lie flat.  Does ankle edema related to venous insufficiency.  She does not have orthopnea.  No racing or pounding heart.  Past Medical History:  Diagnosis Date  . 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 Arthritis    generalized  . Asthma   . Atrophic vaginitis   . Back pain    DDD/stenosis  . Carotid stenosis    Carotid US 10/16: Plaque RICA (9-03%), normal LICA  . Depression    takes Cymbalta daily  . Diverticulosis    benign  . DJD (degenerative joint disease)   . Dyslipidemia   . Dysrhythmia   . Family history of GI bleeding   . GERD (gastroesophageal reflux disease)    takes Nexium daily  . Gestational diabetes   . H/O hiatal hernia    surgery for hernia  . Headache(784.0)    takes Imitrex daily as needed and Bisoprolol daily;last migraine was about 2wks ago  . History of bronchitis 1 yr ago  . History of shingles   . Insomnia    takes Ambien nightly  . Joint pain   . Joint swelling   . Leg cramps     takes Flexeril daily as needed  . 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 Nocturia   . Osteoporosis   . Peripheral neuropathy    takes Gabapentin daily  . Pneumonia 33yrs ago   hx of  . Restless leg syndrome    takes Requip daily  . RLS (restless legs syndrome) 08/16/2015  . Sleep apnea    uses CPAP  . Tubular adenoma of colon   . Vertigo   . Vitamin D deficiency     Past Surgical History:  Procedure Laterality Date  . COLONOSCOPY    . EP IMPLANTABLE DEVICE N/A 11/19/2016   Procedure: Loop Recorder Insertion;  Surgeon: Deboraha Sprang, MD;  Location: [facility];  Service: Cardiovascular;  Laterality: N/A;  . ESOPHAGOGASTRODUODENOSCOPY    . excess skin removal     post weight loss  . GASTRIC BYPASS  2001  . HERNIA REPAIR     umbilical  . JOINT REPLACEMENT Right    knee  . skin reduction  2003 2004   stomach and arm  . STEROID INJECTION TO SCAR     x 2 in back   . TOTAL KNEE ARTHROPLASTY Left 04/03/2015   Procedure: LEFT TOTAL KNEE ARTHROPLASTY;  Surgeon:  Melrose Nakayama, MD;  Location: [facility];  Service: Orthopedics;  Laterality: Left;  . WISDOM TOOTH EXTRACTION      Current Medications: Current Meds  Medication Sig  . amphetamine-dextroamphetamine (ADDERALL) 10 MG tablet Take 1 tablet (10 mg total) by mouth daily before breakfast.  . ASPIRIN 81 PO Take 81 mg by mouth at bedtime.  . baclofen (LIORESAL) 10 MG tablet TAKE 1 TABLET(10 MG) BY MOUTH TWICE DAILY  . bisoprolol (ZEBETA) 10 MG tablet Take 10 mg by mouth 2 (two) times daily.  . calcium carbonate (OS-CAL) 1250 (500 CA) MG chewable tablet Chew 1 tablet by mouth daily. Not sure of dosage  . diclofenac sodium (VOLTAREN) 1 % GEL Apply 4 g topically 4 (four) times daily.  . DULoxetine (CYMBALTA) 60 MG capsule Take 60 mg by mouth daily after supper.  Eduard Roux (AIMOVIG) 140 MG/ML SOAJ Inject 140 mg into the skin every 30 (thirty) days.  Marland Kitchen esomeprazole (NEXIUM) 40 MG capsule Take 40 mg by mouth 2 (two) times daily  before a meal.   . ezetimibe (ZETIA) 10 MG tablet Take 1 tablet by mouth daily.  . fluticasone (FLONASE) 50 MCG/ACT nasal spray 2 sprays in each nostril  . gabapentin (NEURONTIN) 600 MG tablet Take 1 tablet by mouth 2 (two) times daily.  Marland Kitchen HYDROcodone-acetaminophen (NORCO) 10-325 MG tablet Take 1 tablet by mouth every 8 (eight) hours as needed for moderate pain or severe pain. Takes for migraines  . Loratadine 10 MG CAPS Take 1 capsule by mouth as needed (for allergies).   . nystatin (MYCOSTATIN) 100000 UNIT/ML suspension 5 ml  . ondansetron (ZOFRAN-ODT) 4 MG disintegrating tablet DISSOLVE 1 TABLET(4 MG) ON THE TONGUE EVERY 8 HOURS AS NEEDED FOR NAUSEA OR VOMITING  . Pitavastatin Calcium (LIVALO) 2 MG TABS 1 tablet  . PROAIR HFA 108 (90 Base) MCG/ACT inhaler 2 INHALATIONS EVERY 4 HOURS AS NEEDED FOR ASTHMA SYMPTOMS (WHEEZING/SHORTNESS OF BREATH)  . quiNINE (QUALAQUIN) 324 MG capsule TAKE ONE CAPSULE BY MOUTH TWICE A DAY  . rOPINIRole (REQUIP) 1 MG tablet TAKE 1 TABLET(1 MG) BY MOUTH TWICE DAILY BEFORE LUNCH AND SUPPER  . Vitamin D, Ergocalciferol, (DRISDOL) 50000 units CAPS capsule Take 1 capsule (50,000 Units total) by mouth every 7 (seven) days.  Marland Kitchen zolpidem (AMBIEN) 10 MG tablet Take 10 mg by mouth at bedtime.  . [DISCONTINUED] Evolocumab (REPATHA SURECLICK) 433 MG/ML SOAJ Inject 1 pen into the skin every 14 (fourteen) days.   Current Facility-Administered Medications for the 04/10/21 encounter (Office Visit) with [name], MD  Medication  . Erenumab-aooe SOAJ 140 mg     Allergies:   Atorvastatin and Ezetimibe   Social History   Socioeconomic History  . Marital status: Married    Spouse name: Pilar Jarvis  . Number of children: 3  . Years of education: college  . Highest education level: Not on file  Occupational History  . Occupation: Retired  Tobacco Use  . Smoking status: Never Smoker  . Smokeless tobacco: Never Used  Vaping Use  . Vaping Use: Never used  Substance and  Sexual Activity  . Alcohol use: No    Alcohol/week: 0.0 standard drinks  . Drug use: No  . Sexual activity: Yes    Birth control/protection: Post-menopausal  Other Topics Concern  . Not on file  Social History Narrative   Lives at home w/ her husband   Caffeine 8-10 cups daily.  (coffee 1 cup am, unsw tea all day long).    Social  Determinants of Health   Financial Resource Strain: Not on file  Food Insecurity: Not on file  Transportation Needs: Not on file  Physical Activity: Not on file  Stress: Not on file  Social Connections: Not on file     Family History: The patient's family history includes Breast cancer in her maternal grandmother; CAD in her mother and paternal grandfather; Colon cancer in her father; Heart disease in her mother; Hypertension in her mother; Neuropathy in her mother; Osteoarthritis in her mother.  ROS:   Please see the history of present illness.    Her feet swell and are discolored with a cyanotic hue.  Decreased balance.  Weakness in her legs.  Denies angina.  No blood in the urine or stool.  A mixup occurred relative to Repatha therapy.  After she fractured her right shoulder, she did not go back to her [facility] which is where Repatha was being stored in the refrigerator.  Therefore she has not had Repatha for 3 months.  All other systems reviewed and are negative.  EKGs/Labs/Other Studies Reviewed:    The following studies were reviewed today:  Lower extremity vascular Doppler study performed 02/22/2019: Summary:  Right: Resting right ankle-brachial index is within normal range. No  evidence of significant right lower extremity arterial disease. The right  toe-brachial index is normal.   Left: Resting left ankle-brachial index is within normal range. No  evidence of significant left lower extremity arterial disease. The left  toe-brachial index is normal.      *See table(s) above for measurements and observations.      Electronically  signed by Kathlyn Sacramento MD on 02/22/2019 at 5:06:33 PM.   EKG:  EKG normal sinus rhythm, normal PR interval, normal appearance.  Recent Labs: No results found for requested labs within last 8760 hours.  Recent Lipid Panel    Component Value Date/Time   CHOL 223 (H) 02/27/2016 1001   TRIG 143 02/27/2016 1001   HDL 48 02/27/2016 1001   CHOLHDL 4.6 (H) 02/27/2016 1001   LDLCALC 146 (H) 02/27/2016 1001    Physical Exam:    VS:  BP 122/68   Pulse 75   Ht 5\' 7"  (1.702 m)   Wt 238 lb 3.2 oz (108 kg)   SpO2 98%   BMI 37.31 kg/m     Wt Readings from Last 3 Encounters:  04/10/21 238 lb 3.2 oz (108 kg)  11/04/20 231 lb 14.8 oz (105.2 kg)  07/31/20 232 lb (105.2 kg)     GEN: Morbid obesity. No acute distress HEENT: Normal NECK: No JVD. LYMPHATICS: No lymphadenopathy CARDIAC: No murmur. RRR no gallop, or edema. VASCULAR: Marked superficial varicosities are noted in both lower extremities including the calves and thighs.  Toes are cyanotic in color.  Normal Pulses. No bruits. RESPIRATORY:  Clear to auscultation without rales, wheezing or rhonchi  ABDOMEN: Soft, non-tender, non-distended, No pulsatile mass, MUSCULOSKELETAL: No deformity  SKIN: Warm and dry NEUROLOGIC:  Alert and oriented x 3 PSYCHIATRIC:  Normal affect   ASSESSMENT:    1. Paroxysmal atrial fibrillation (HCC)   2. Preoperative clearance   3. Mixed hyperlipidemia   4. Cerebrovascular accident (CVA), unspecified mechanism ([place])   5. Morbid obesity ([place])   6. Obstructive sleep apnea    PLAN:    In order of problems listed above:  1. No instances of atrial fibrillation in the interval since last being seen.  Continue monitoring for the possibility of A. Fib. 2. She is cleared  for the upcoming right shoulder replacement by Dr. Tamera Punt.  Continue Repatha 3. Continue Repatha therapy. 4. Continue risk factor modification, especially lipid-lowering. 5. She needs to lose weight.  Physical activity is  encouraged. 6. Encouraged continued CPAP use.  Overall education and awareness concerning secondary risk prevention was discussed in detail: LDL less than 70, hemoglobin A1c less than 7, blood pressure target less than 130/80 mmHg, >150 minutes of moderate aerobic activity per week, avoidance of smoking, weight control (via diet and exercise), and continued surveillance/management of/for obstructive sleep apnea.    Medication Adjustments/Labs and Tests Ordered: Current medicines are reviewed at length with the patient today.  Concerns regarding medicines are outlined above.  Orders Placed This Encounter  Procedures  . EKG 12-Lead   No orders of the defined types were placed in this encounter.   Patient Instructions  Medication Instructions:  Your physician recommends that you continue on your current medications as directed. Please refer to the Current Medication list given to you today.  *If you need a refill on your cardiac medications before your next appointment, please call your pharmacy*   Lab Work: none If you have labs (blood work) drawn today and your tests are completely normal, you will receive your results only by: Marland Kitchen MyChart Message (if you have MyChart) OR . A paper copy in the mail If you have any lab test that is abnormal or we need to change your treatment, we will call you to review the results.   Testing/Procedures: None   Follow-Up: At [facility], you and your health needs are our priority.  As part of our continuing mission to provide you with exceptional heart care, we have created designated Provider Care Teams.  These Care Teams include your primary Cardiologist (physician) and Advanced Practice Providers (APPs -  Physician Assistants and Nurse Practitioners) who all work together to provide you with the care you need, when you need it.  We recommend signing up for the patient portal called "MyChart".  Sign up information is provided on this After  Visit Summary.  MyChart is used to connect with patients for Virtual Visits (Telemedicine).  Patients are able to view lab/test results, encounter notes, upcoming appointments, etc.  Non-urgent messages can be sent to your provider as well.   To learn more about what you can do with MyChart, go to [email or web address].    Your next appointment:   1 year(s)  The format for your next appointment:   In Person  Provider:   You may see Sinclair Grooms, MD or one of the following Advanced Practice Providers on your designated Care Team:    Kathyrn Drown, NP    Other Instructions      Signed, Sinclair Grooms, MD  04/10/2021 11:23 AM    [place]

## 2021-04-10 ENCOUNTER — Encounter: Payer: Self-pay | Admitting: Interventional Cardiology

## 2021-04-10 ENCOUNTER — Other Ambulatory Visit: Payer: Self-pay

## 2021-04-10 ENCOUNTER — Ambulatory Visit (INDEPENDENT_AMBULATORY_CARE_PROVIDER_SITE_OTHER): Payer: Medicare Other | Admitting: Interventional Cardiology

## 2021-04-10 ENCOUNTER — Other Ambulatory Visit: Payer: Self-pay | Admitting: Pharmacist

## 2021-04-10 VITALS — BP 122/68 | HR 75 | Ht 67.0 in | Wt 238.2 lb

## 2021-04-10 DIAGNOSIS — I480 Paroxysmal atrial fibrillation: Secondary | ICD-10-CM

## 2021-04-10 DIAGNOSIS — E782 Mixed hyperlipidemia: Secondary | ICD-10-CM

## 2021-04-10 DIAGNOSIS — I639 Cerebral infarction, unspecified: Secondary | ICD-10-CM

## 2021-04-10 DIAGNOSIS — Z01818 Encounter for other preprocedural examination: Secondary | ICD-10-CM | POA: Diagnosis not present

## 2021-04-10 DIAGNOSIS — G4733 Obstructive sleep apnea (adult) (pediatric): Secondary | ICD-10-CM | POA: Diagnosis not present

## 2021-04-10 MED ORDER — REPATHA SURECLICK 140 MG/ML ~~LOC~~ SOAJ: 1.0000 "pen " | SUBCUTANEOUS | 11 refills | Status: DC

## 2021-04-10 MED ORDER — REPATHA SURECLICK 140 MG/ML ~~LOC~~ SOAJ: 1.0000 "pen " | SUBCUTANEOUS | 0 refills | Status: DC

## 2021-04-10 NOTE — Patient Instructions (Signed)
Medication Instructions:  Your physician recommends that you continue on your current medications as directed. Please refer to the Current Medication list given to you today.  *If you need a refill on your cardiac medications before your next appointment, please call your pharmacy*   Lab Work: none If you have labs (blood work) drawn today and your tests are completely normal, you will receive your results only by: Marland Kitchen MyChart Message (if you have MyChart) OR . A paper copy in the mail If you have any lab test that is abnormal or we need to change your treatment, we will call you to review the results.   Testing/Procedures: None   Follow-Up: At [facility], you and your health needs are our priority.  As part of our continuing mission to provide you with exceptional heart care, we have created designated Provider Care Teams.  These Care Teams include your primary Cardiologist (physician) and Advanced Practice Providers (APPs -  Physician Assistants and Nurse Practitioners) who all work together to provide you with the care you need, when you need it.  We recommend signing up for the patient portal called "MyChart".  Sign up information is provided on this After Visit Summary.  MyChart is used to connect with patients for Virtual Visits (Telemedicine).  Patients are able to view lab/test results, encounter notes, upcoming appointments, etc.  Non-urgent messages can be sent to your provider as well.   To learn more about what you can do with MyChart, go to [email or web address].    Your next appointment:   1 year(s)  The format for your next appointment:   In Person  Provider:   You may see Sinclair Grooms, MD or one of the following Advanced Practice Providers on your designated Care Team:    Kathyrn Drown, NP    Other Instructions

## 2021-04-10 NOTE — Telephone Encounter (Signed)
Cost of Repatha is $367.63. Called pt insurance- this is with deductible. After she pays this, he deductible will be met and her cost is 18% of drug cost- $101.54/ month Patient states she does not qualify for grant. Called pt-she is ok with the cost

## 2021-04-11 ENCOUNTER — Other Ambulatory Visit: Payer: Self-pay | Admitting: Neurology

## 2021-04-11 ENCOUNTER — Telehealth: Payer: Self-pay | Admitting: *Deleted

## 2021-04-11 NOTE — Telephone Encounter (Signed)
Received surgery clearance form for Right Reverse Total Shoulder from Dr Tamera Punt at [place]. Form reviewed and signed by Dr [name]. Pt low risk from neurological perspective. Aspirin to be safely stopped/resumed per surgeon. Form faxed back to [facility]. Received a receipt of confirmation.

## 2021-04-15 DIAGNOSIS — H1131 Conjunctival hemorrhage, right eye: Secondary | ICD-10-CM | POA: Diagnosis not present

## 2021-04-16 DIAGNOSIS — H1131 Conjunctival hemorrhage, right eye: Secondary | ICD-10-CM | POA: Diagnosis not present

## 2021-04-19 NOTE — Progress Notes (Addendum)
COVID Vaccine Completed:  x3 Date COVID Vaccine completed:  Has received booster:  2-22 COVID vaccine manufacturer: Pfizer     Date of COVID positive in last 34 days:N/A  PCP - [name], DO Cardiologist - Daneen Schick, MD  Cardiac clearance in noted dated by Dr. Daneen Schick 04-10-21  Chest x-ray - 04-22-21 Epic EKG - 04-10-21 Epic Stress Test - 2015 Epic ECHO - 03-26-17 Epic Cardiac Cath - 5+ years ago Pacemaker/ICD device last checked: Spinal Cord Stimulator: Loop Recorder - no longer on Telemetry monitoring - 2017 Epic  Sleep Study - 04-17-20, +sleep apnea CPAP - Yes  Fasting Blood Sugar - N/A Checks Blood Sugar _____ times a day  Blood Thinner Instructions: Aspirin Instructions:  ASA 81 mg  Last Dose:  04-19-21  Activity level:  Can go up a flight of stairs and perform activities of daily living without stopping and without symptoms of chest pain or shortness of breath.  Able to exercise without symptoms of chest pain or shortness of breath.  Patient walks daily   Anesthesia review: Afib, pulmonary HTN, HTN, OSA.  CVA.  Patient complains of bilateral leg swelling with pain at night (evaluated by Konrad Felix, PA-C)  Patient denies shortness of breath, fever, cough and chest pain at PAT appointment   Patient verbalized understanding of instructions that were given to them at the PAT appointment. Patient was also instructed that they will need to review over the PAT instructions again at home before surgery.

## 2021-04-19 NOTE — Patient Instructions (Addendum)
DUE TO COVID-19 ONLY ONE VISITOR IS ALLOWED TO COME WITH YOU AND STAY IN THE WAITING ROOM ONLY DURING PRE OP AND PROCEDURE.   **NO VISITORS ARE ALLOWED IN THE SHORT STAY AREA OR RECOVERY ROOM!!**    COVID SWAB TESTING MUST BE COMPLETED ON:  Tuesday, 04-23-21 @ 2:45 PM   [address] [place], [place] [address]  (Must self quarantine after testing. Follow instructions on handout.)       Your procedure is scheduled on:  Thursday, 04-25-21   Report to [facility] Main  Entrance    Report to admitting at 7:20  AM   Call this number if you have problems the morning of surgery [phone]   Do not eat food :After Midnight.   May have liquids until 6:45 AM day of surgery  CLEAR LIQUID DIET  Foods Allowed                                                                     Foods Excluded  Water, Black Coffee and tea, regular and decaf             liquids that you cannot  Plain Jell-O in any flavor  (No red)                                    see through such as: Fruit ices (not with fruit pulp)                                      milk, soups, orange juice              Iced Popsicles (No red)                                      All solid food                                   Apple juices Sports drinks like Gatorade (No red) Lightly seasoned clear broth or consume(fat free) Sugar, honey syrup     Complete one Ensure drink the morning of surgery at 6:45 AM the day of surgery.     1. The day of surgery:  ? Drink ONE (1) Pre-Surgery Clear Ensure or G2 by am the morning of surgery. Drink in one sitting. Do not sip.  ? This drink was given to you during your hospital  pre-op appointment visit. ? Nothing else to drink after completing the  Pre-Surgery Clear Ensure or G2.          If you have questions, please contact your surgeon's office.     Oral Hygiene is also important to reduce your risk of infection.                                    Remember - BRUSH YOUR TEETH THE  MORNING OF SURGERY WITH YOUR REGULAR TOOTHPASTE   Do NOT smoke after Midnight   Take these medicines the morning of surgery with A SIP OF WATER:  Bisoprolol, Nexium, Zetia, Gabapentin, Loratadine, Quinine,  Ropinirole.  Okay to use inhaler and bring with you day of surgery.              You may not have any metal on your body including hair pins, jewelry, and body piercings             Do not wear make-up, lotions, powders, perfumes/cologne, or deodorant             Do not wear nail polish.  Do not shave  48 hours prior to surgery.             Do not bring valuables to the hospital. [place].   Contacts, dentures or bridgework may not be worn into surgery.   Bring CPAP mask and tubing day of surgery     Patients discharged the day of surgery will not be allowed to drive home.                Please read over the following fact sheets you were given: IF YOU HAVE QUESTIONS ABOUT YOUR PRE OP INSTRUCTIONS PLEASE CALL  [facility]- Preparing for Total Shoulder Arthroplasty    Before surgery, you can play an important role. Because skin is not sterile, your skin needs to be as free of germs as possible. You can reduce the number of germs on your skin by using the following products. . Benzoyl Peroxide Gel o Reduces the number of germs present on the skin o Applied twice a day to shoulder area starting two days before surgery    ==================================================================  Please follow these instructions carefully:  BENZOYL PEROXIDE 5% GEL  Please do not use if you have an allergy to benzoyl peroxide.   If your skin becomes reddened/irritated stop using the benzoyl peroxide.  Starting two days before surgery, apply as follows: 1. Apply benzoyl peroxide in the morning and at night. Apply after taking a shower. If you are not taking a shower clean entire shoulder front, back, and side along with the armpit  with a clean wet washcloth.  2. Place a quarter-sized dollop on your shoulder and rub in thoroughly, making sure to cover the front, back, and side of your shoulder, along with the armpit.   2 days before ____ AM   ____ PM              1 day before ____ AM   ____ PM                         3. Do this twice a day for two days.  (Last application is the night before surgery, AFTER using the CHG soap as described below).  4. Do NOT apply benzoyl peroxide gel on the day of surgery.   [facility] - Preparing for Surgery Before surgery, you can play an important role.  Because skin is not sterile, your skin needs to be as free of germs as possible.  You can reduce the number of germs on your skin by washing with CHG (chlorahexidine gluconate) soap before surgery.  CHG is an antiseptic cleaner which kills germs and bonds with the skin to continue killing germs even after washing. Please DO NOT  use if you have an allergy to CHG or antibacterial soaps.  If your skin becomes reddened/irritated stop using the CHG and inform your nurse when you arrive at Short Stay. Do not shave (including legs and underarms) for at least 48 hours prior to the first CHG shower.  You may shave your face/neck.  Please follow these instructions carefully:  1.  Shower with CHG Soap the night before surgery and the  morning of surgery.  2.  If you choose to wash your hair, wash your hair first as usual with your normal  shampoo.  3.  After you shampoo, rinse your hair and body thoroughly to remove the shampoo.                             4.  Use CHG as you would any other liquid soap.  You can apply chg directly to the skin and wash.  Gently with a scrungie or clean washcloth.  5.  Apply the CHG Soap to your body ONLY FROM THE NECK DOWN.   Do   not use on face/ open                           Wound or open sores. Avoid contact with eyes, ears mouth and   genitals (private parts).                       Wash face,  Genitals  (private parts) with your normal soap.             6.  Wash thoroughly, paying special attention to the area where your    surgery  will be performed.  7.  Thoroughly rinse your body with warm water from the neck down.  8.  DO NOT shower/wash with your normal soap after using and rinsing off the CHG Soap.                9.  Pat yourself dry with a clean towel.            10.  Wear clean pajamas.            11.  Place clean sheets on your bed the night of your first shower and do not  sleep with pets. Day of Surgery : Do not apply any lotions/deodorants the morning of surgery.  Please wear clean clothes to the hospital/surgery center.  FAILURE TO FOLLOW THESE INSTRUCTIONS MAY RESULT IN THE CANCELLATION OF YOUR SURGERY  PATIENT SIGNATURE_________________________________  NURSE SIGNATURE__________________________________  ________________________________________________________________________   Adam Phenix  An incentive spirometer is a tool that can help keep your lungs clear and active. This tool measures how well you are filling your lungs with each breath. Taking long deep breaths may help reverse or decrease the chance of developing breathing (pulmonary) problems (especially infection) following:  A long period of time when you are unable to move or be active. BEFORE THE PROCEDURE   If the spirometer includes an indicator to show your best effort, your nurse or respiratory therapist will set it to a desired goal.  If possible, sit up straight or lean slightly forward. Try not to slouch.  Hold the incentive spirometer in an upright position. INSTRUCTIONS FOR USE  1. Sit on the edge of your bed if possible, or sit up as far as you can in bed or on a chair. 2.  Hold the incentive spirometer in an upright position. 3. Breathe out normally. 4. Place the mouthpiece in your mouth and seal your lips tightly around it. 5. Breathe in slowly and as deeply as possible, raising the  piston or the ball toward the top of the column. 6. Hold your breath for 3-5 seconds or for as long as possible. Allow the piston or ball to fall to the bottom of the column. 7. Remove the mouthpiece from your mouth and breathe out normally. 8. Rest for a few seconds and repeat Steps 1 through 7 at least 10 times every 1-2 hours when you are awake. Take your time and take a few normal breaths between deep breaths. 9. The spirometer may include an indicator to show your best effort. Use the indicator as a goal to work toward during each repetition. 10. After each set of 10 deep breaths, practice coughing to be sure your lungs are clear. If you have an incision (the cut made at the time of surgery), support your incision when coughing by placing a pillow or rolled up towels firmly against it. Once you are able to get out of bed, walk around indoors and cough well. You may stop using the incentive spirometer when instructed by your caregiver.  RISKS AND COMPLICATIONS  Take your time so you do not get dizzy or light-headed.  If you are in pain, you may need to take or ask for pain medication before doing incentive spirometry. It is harder to take a deep breath if you are having pain. AFTER USE  Rest and breathe slowly and easily.  It can be helpful to keep track of a log of your progress. Your caregiver can provide you with a simple table to help with this. If you are using the spirometer at home, follow these instructions: [place] IF:   You are having difficultly using the spirometer.  You have trouble using the spirometer as often as instructed.  Your pain medication is not giving enough relief while using the spirometer.  You develop fever of 100.5 F (38.1 C) or higher. SEEK IMMEDIATE MEDICAL CARE IF:   You cough up bloody sputum that had not been present before.  You develop fever of 102 F (38.9 C) or greater.  You develop worsening pain at or near the incision  site. MAKE SURE YOU:   Understand these instructions.  Will watch your condition.  Will get help right away if you are not doing well or get worse. Document Released: 04/13/2007 Document Revised: 02/23/2012 Document Reviewed: 06/14/2007 [facility] Patient Information 2014 [place], [place].   ________________________________________________________________________

## 2021-04-22 ENCOUNTER — Encounter (HOSPITAL_COMMUNITY): Payer: Self-pay

## 2021-04-22 ENCOUNTER — Other Ambulatory Visit: Payer: Self-pay

## 2021-04-22 ENCOUNTER — Ambulatory Visit (HOSPITAL_COMMUNITY)
Admission: RE | Admit: 2021-04-22 | Discharge: 2021-04-22 | Disposition: A | Discharge status: Home / Self Care | Payer: Medicare Other | Source: Ambulatory Visit | Attending: Orthopedic Surgery | Admitting: Orthopedic Surgery

## 2021-04-22 ENCOUNTER — Encounter (HOSPITAL_COMMUNITY)
Admission: RE | Admit: 2021-04-22 | Discharge: 2021-04-22 | Disposition: A | Discharge status: Home / Self Care | Payer: Medicare Other | Source: Ambulatory Visit | Attending: Orthopedic Surgery | Admitting: Orthopedic Surgery

## 2021-04-22 DIAGNOSIS — Z01811 Encounter for preprocedural respiratory examination: Secondary | ICD-10-CM | POA: Insufficient documentation

## 2021-04-22 DIAGNOSIS — Z01818 Encounter for other preprocedural examination: Secondary | ICD-10-CM | POA: Insufficient documentation

## 2021-04-22 HISTORY — DX: Other specified postprocedural states: Z98.890

## 2021-04-22 HISTORY — DX: Cerebral infarction, unspecified: I63.9

## 2021-04-22 HISTORY — DX: Other specified postprocedural states: R11.2

## 2021-04-22 LAB — COMPREHENSIVE METABOLIC PANEL
ALT: 14 U/L (ref 0–44)
AST: 19 U/L (ref 15–41)
Albumin: 3.3 g/dL — ABNORMAL LOW (ref 3.5–5.0)
Alkaline Phosphatase: 94 U/L (ref 38–126)
Anion gap: 7 (ref 5–15)
BUN: 17 mg/dL (ref 8–23)
CO2: 27 mmol/L (ref 22–32)
Calcium: 8.8 mg/dL — ABNORMAL LOW (ref 8.9–10.3)
Chloride: 106 mmol/L (ref 98–111)
Creatinine, Ser: 1.03 mg/dL — ABNORMAL HIGH (ref 0.44–1.00)
GFR, Estimated: 57 mL/min — ABNORMAL LOW (ref >60)
Glucose, Bld: 103 mg/dL — ABNORMAL HIGH (ref 70–99)
Potassium: 4.3 mmol/L (ref 3.5–5.1)
Sodium: 140 mmol/L (ref 135–145)
Total Bilirubin: 0.3 mg/dL (ref 0.3–1.2)
Total Protein: 7.2 g/dL (ref 6.5–8.1)

## 2021-04-22 LAB — HEMOGLOBIN A1C
Hgb A1c MFr Bld: 5.9 % — ABNORMAL HIGH (ref 4.8–5.6)
Mean Plasma Glucose: 122.63 mg/dL

## 2021-04-22 LAB — APTT: aPTT: 27 seconds (ref 24–36)

## 2021-04-22 LAB — URINALYSIS, ROUTINE W REFLEX MICROSCOPIC
Bilirubin Urine: NEGATIVE
Glucose, UA: NEGATIVE mg/dL
Ketones, ur: 5 mg/dL — AB
Leukocytes,Ua: NEGATIVE
Nitrite: NEGATIVE
Protein, ur: NEGATIVE mg/dL
Specific Gravity, Urine: 1.026 (ref 1.005–1.030)
pH: 5 (ref 5.0–8.0)

## 2021-04-22 LAB — CBC WITH DIFFERENTIAL/PLATELET
Abs Immature Granulocytes: 0.04 10*3/uL (ref 0.00–0.07)
Basophils Absolute: 0.1 10*3/uL (ref 0.0–0.1)
Basophils Relative: 1 %
Eosinophils Absolute: 0.2 10*3/uL (ref 0.0–0.5)
Eosinophils Relative: 2 %
HCT: 39.4 % (ref 36.0–46.0)
Hemoglobin: 12.1 g/dL (ref 12.0–15.0)
Immature Granulocytes: 1 %
Lymphocytes Relative: 14 %
Lymphs Abs: 1 10*3/uL (ref 0.7–4.0)
MCH: 31.2 pg (ref 26.0–34.0)
MCHC: 30.7 g/dL (ref 30.0–36.0)
MCV: 101.5 fL — ABNORMAL HIGH (ref 80.0–100.0)
Monocytes Absolute: 0.5 10*3/uL (ref 0.1–1.0)
Monocytes Relative: 7 %
Neutro Abs: 5.5 10*3/uL (ref 1.7–7.7)
Neutrophils Relative %: 75 %
Platelets: 268 10*3/uL (ref 150–400)
RBC: 3.88 MIL/uL (ref 3.87–5.11)
RDW: 14.8 % (ref 11.5–15.5)
WBC: 7.3 10*3/uL (ref 4.0–10.5)
nRBC: 0 % (ref 0.0–0.2)

## 2021-04-22 LAB — SURGICAL PCR SCREEN
MRSA, PCR: NEGATIVE
Staphylococcus aureus: POSITIVE — AB

## 2021-04-22 IMAGING — CR DG CHEST 2V
2 series · 2 of 2 positions shown · non-contrast
Comparison: CT chest, abdomen and pelvis [DATE]. PA and lateral
chest [DATE].

CLINICAL DATA: Preoperative examination. Patient for shoulder
replacement.

EXAM:
CHEST - 2 VIEW

[w chest pa]
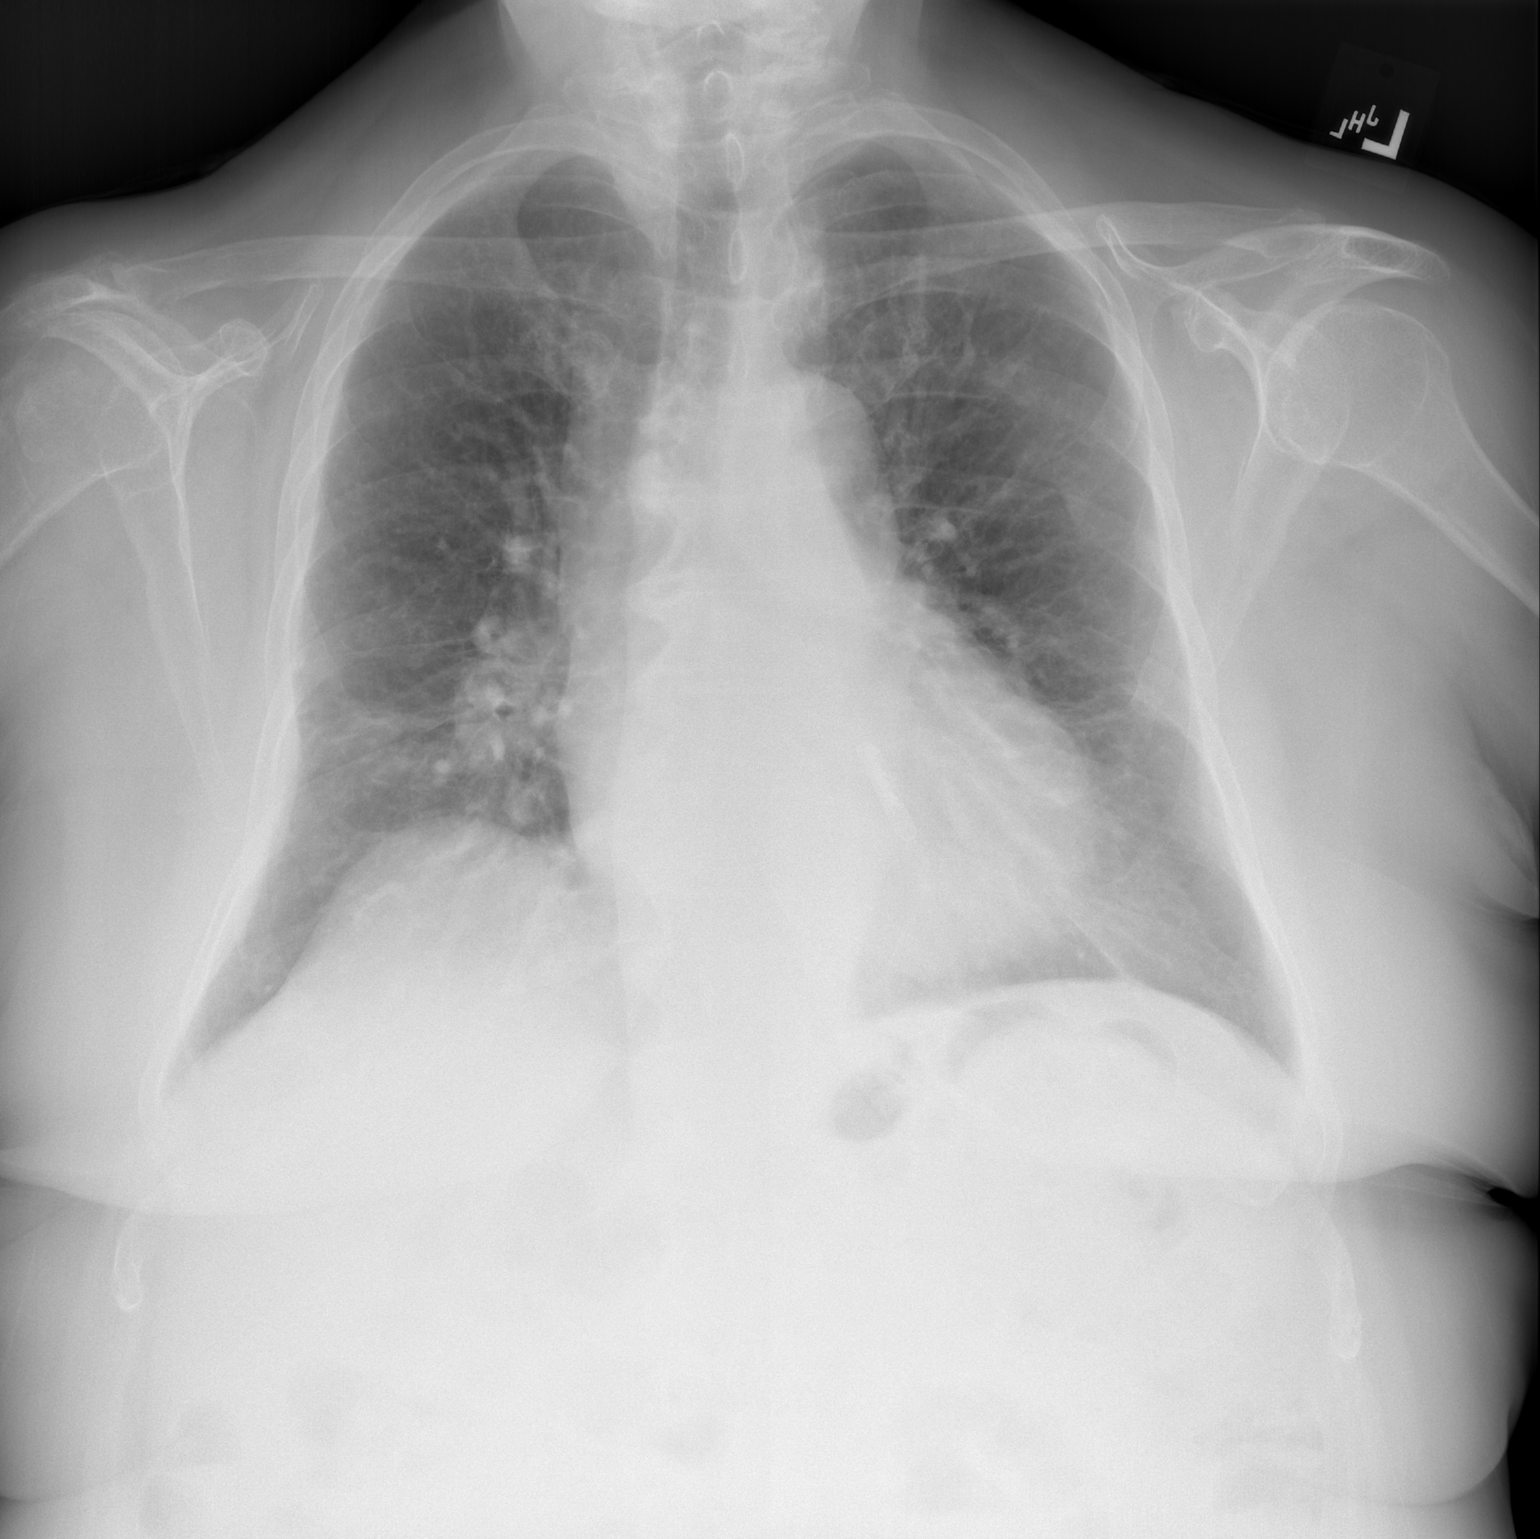

[w chest lat]
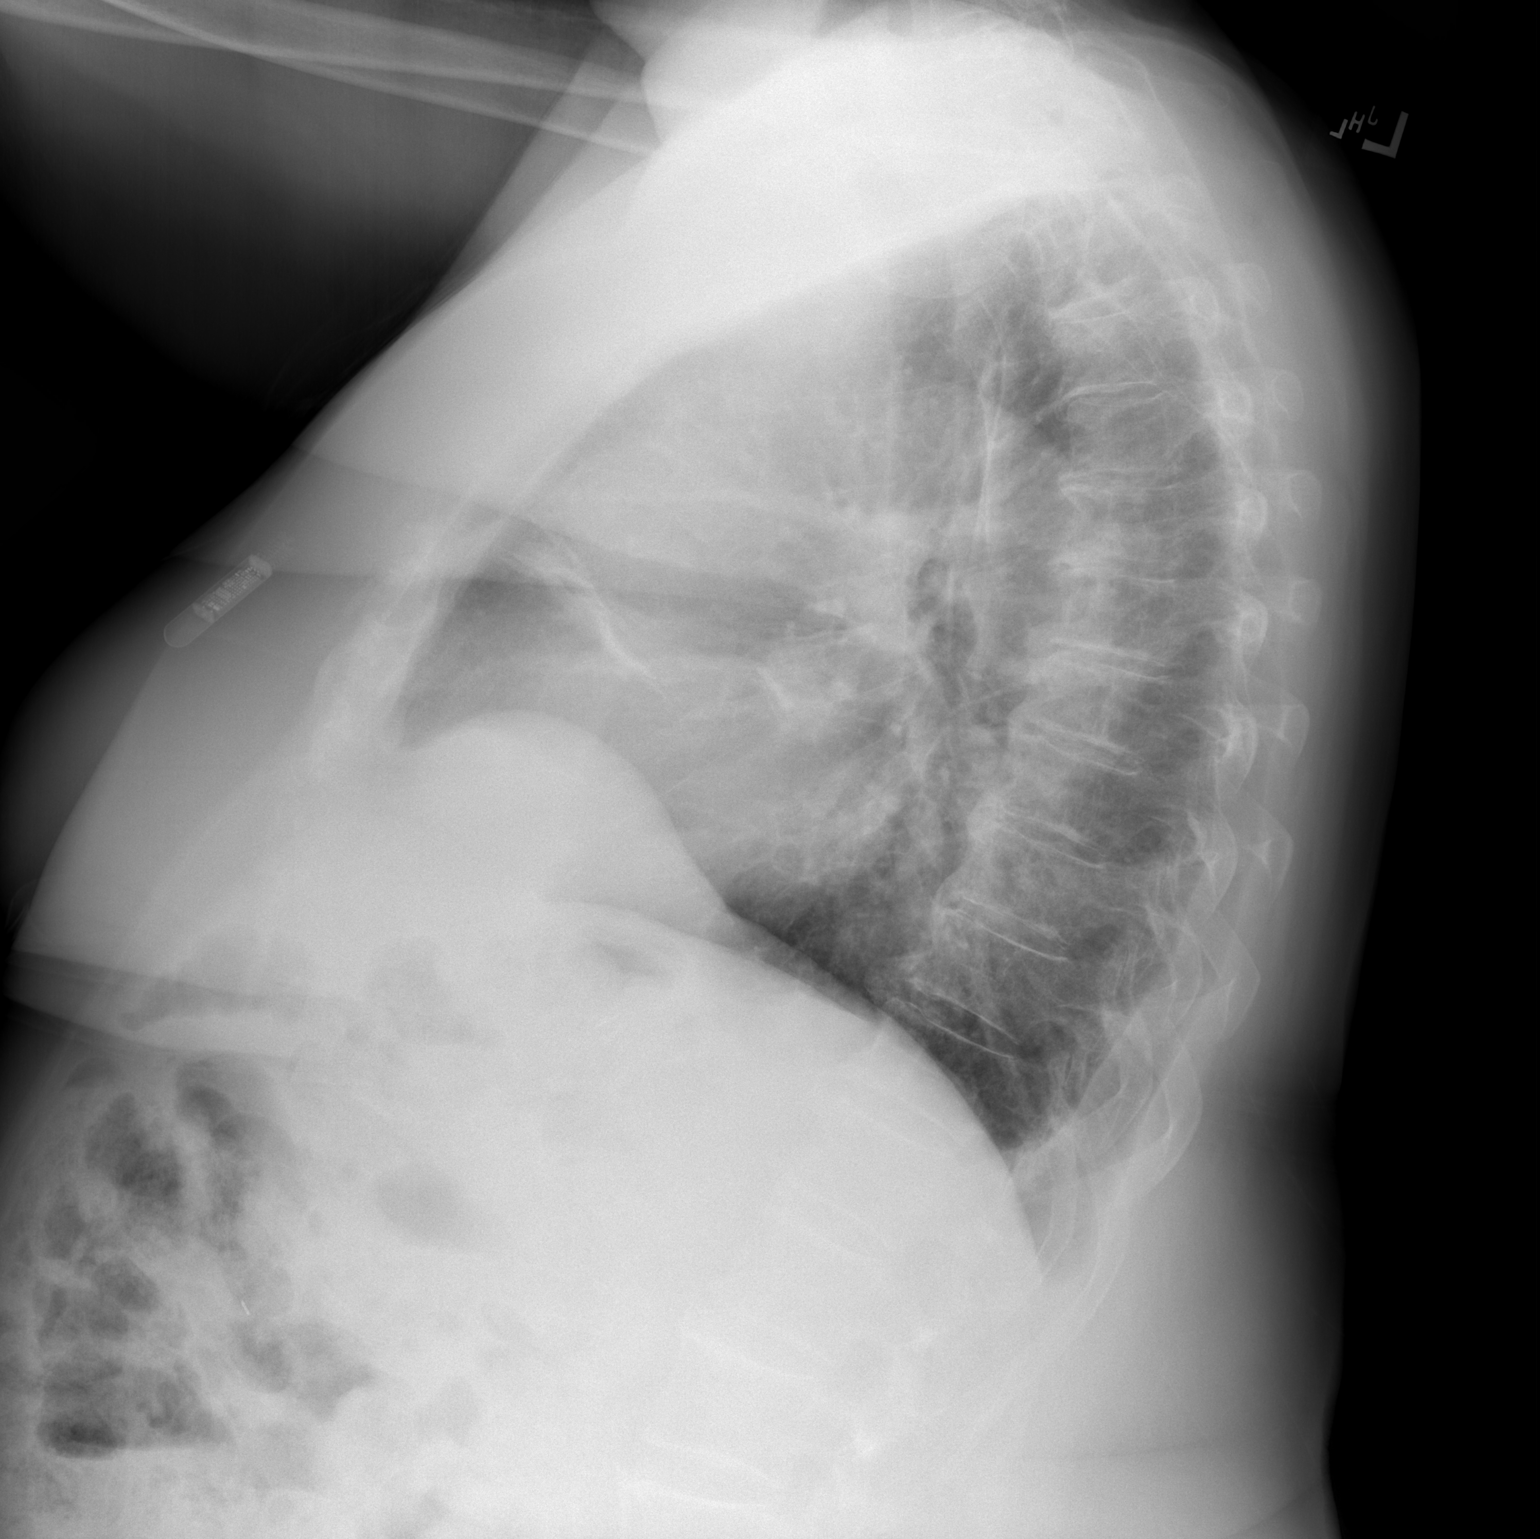

[2 of 2 positions shown; findings below may reference images not displayed]

FINDINGS: Heart size is upper normal. There is some linear atelectasis or scar
in the lingula. The lungs are otherwise clear. No pneumothorax or
pleural fluid. No acute or focal bony abnormality.
IMPRESSION: No acute disease.

## 2021-04-23 ENCOUNTER — Other Ambulatory Visit (HOSPITAL_COMMUNITY)
Admission: RE | Admit: 2021-04-23 | Discharge: 2021-04-23 | Disposition: A | Discharge status: Home / Self Care | Payer: Medicare Other | Source: Ambulatory Visit | Attending: Orthopedic Surgery | Admitting: Orthopedic Surgery

## 2021-04-23 DIAGNOSIS — Z20822 Contact with and (suspected) exposure to covid-19: Secondary | ICD-10-CM | POA: Insufficient documentation

## 2021-04-23 DIAGNOSIS — Z01812 Encounter for preprocedural laboratory examination: Secondary | ICD-10-CM | POA: Diagnosis not present

## 2021-04-23 LAB — SARS CORONAVIRUS 2 (TAT 6-24 HRS): SARS Coronavirus 2: NEGATIVE

## 2021-04-23 NOTE — Progress Notes (Signed)
PCR results sent to Dr. Chandler to review. 

## 2021-04-24 NOTE — Anesthesia Preprocedure Evaluation (Addendum)
Anesthesia Evaluation  Patient identified by MRN, date of birth, ID band Patient awake    Reviewed: Allergy & Precautions, NPO status , Patient's Chart, lab work & pertinent test results, reviewed documented beta blocker date and time   History of Anesthesia Complications (+) PONV  Airway Mallampati: III  TM Distance: >3 FB Neck ROM: Full    Dental no notable dental hx. (+) Teeth Intact, Dental Advisory Given   Pulmonary asthma , sleep apnea and Continuous Positive Airway Pressure Ventilation ,    Pulmonary exam normal breath sounds clear to auscultation       Cardiovascular hypertension, Pt. on home beta blockers and Pt. on medications + CAD and + Peripheral Vascular Disease  Normal cardiovascular exam+ dysrhythmias (s/p loop recorder, battery not functioning) Atrial Fibrillation  Rhythm:Regular Rate:Normal  TTE 2018 - Left ventricle: The cavity size was normal. There was moderate  focal basal hypertrophy of the septum. Systolic function was  normal. The estimated ejection fraction was in the range of 60%  to 65%. Wall motion was normal; there were no regional wall  motion abnormalities. Doppler parameters are consistent with  abnormal left ventricular relaxation (grade 1 diastolic  dysfunction). The E/e&' ratio is between 8-15, suggesting  indeterminate LV filling pressure.  - Aortic valve: Structurally normal valve. Trileaflet.  Transvalvular velocity was minimally increased. There was no  stenosis. There was no regurgitation.  - Left atrium: The atrium was normal in size.  - Tricuspid valve: There was mild regurgitation.  - Pulmonary arteries: PA peak pressure: 35 mm Hg (S).  - Inferior vena cava: The vessel was normal in size. The  respirophasic diameter changes were in the normal range (>= 50%),  consistent with normal central venous pressure.  Event [place] 2017 Sinus rhythm   Neuro/Psych   Headaches, PSYCHIATRIC DISORDERS Depression CVA, No Residual Symptoms    GI/Hepatic Neg liver ROS, hiatal hernia, GERD  Medicated and Controlled,  Endo/Other  diabetes  Renal/GU negative Renal ROS  negative genitourinary   Musculoskeletal  (+) Arthritis ,   Abdominal   Peds  Hematology negative hematology ROS (+)   Anesthesia Other Findings   Reproductive/Obstetrics                          Anesthesia Physical Anesthesia Plan  ASA: III  Anesthesia Plan: General and Regional   Post-op Pain Management:  Regional for Post-op pain   Induction: Intravenous  PONV Risk Score and Plan: 4 or greater and Midazolam, Dexamethasone, Ondansetron, Treatment may vary due to age or medical condition, Aprepitant, Scopolamine patch - Pre-op and TIVA  Airway Management Planned: Oral ETT  Additional Equipment:   Intra-op Plan:   Post-operative Plan: Extubation in OR  Informed Consent: I have reviewed the patients History and Physical, chart, labs and discussed the procedure including the risks, benefits and alternatives for the proposed anesthesia with the patient or authorized representative who has indicated his/her understanding and acceptance.     Dental advisory given  Plan Discussed with: CRNA  Anesthesia Plan Comments:        Anesthesia Quick Evaluation

## 2021-04-25 ENCOUNTER — Ambulatory Visit (HOSPITAL_COMMUNITY)
Admission: RE | Admit: 2021-04-25 | Discharge: 2021-04-25 | Disposition: A | Discharge status: Home / Self Care | Payer: Medicare Other | Attending: Orthopedic Surgery | Admitting: Orthopedic Surgery

## 2021-04-25 ENCOUNTER — Ambulatory Visit (HOSPITAL_COMMUNITY): Payer: Medicare Other | Admitting: Certified Registered"

## 2021-04-25 ENCOUNTER — Encounter (HOSPITAL_COMMUNITY)
Admission: RE | Disposition: A | Discharge status: Home / Self Care | Payer: Self-pay | Source: Home / Self Care | Attending: Orthopedic Surgery

## 2021-04-25 ENCOUNTER — Ambulatory Visit (HOSPITAL_COMMUNITY): Payer: Medicare Other

## 2021-04-25 ENCOUNTER — Encounter (HOSPITAL_COMMUNITY): Payer: Self-pay | Admitting: Orthopedic Surgery

## 2021-04-25 ENCOUNTER — Ambulatory Visit (HOSPITAL_COMMUNITY): Payer: Medicare Other | Admitting: Physician Assistant

## 2021-04-25 DIAGNOSIS — Z9884 Bariatric surgery status: Secondary | ICD-10-CM | POA: Diagnosis not present

## 2021-04-25 DIAGNOSIS — W19XXXD Unspecified fall, subsequent encounter: Secondary | ICD-10-CM | POA: Diagnosis not present

## 2021-04-25 DIAGNOSIS — M75101 Unspecified rotator cuff tear or rupture of right shoulder, not specified as traumatic: Secondary | ICD-10-CM | POA: Insufficient documentation

## 2021-04-25 DIAGNOSIS — Z471 Aftercare following joint replacement surgery: Secondary | ICD-10-CM | POA: Diagnosis not present

## 2021-04-25 DIAGNOSIS — Z8673 Personal history of transient ischemic attack (TIA), and cerebral infarction without residual deficits: Secondary | ICD-10-CM | POA: Diagnosis not present

## 2021-04-25 DIAGNOSIS — Z96611 Presence of right artificial shoulder joint: Secondary | ICD-10-CM | POA: Diagnosis not present

## 2021-04-25 DIAGNOSIS — S42251K Displaced fracture of greater tuberosity of right humerus, subsequent encounter for fracture with nonunion: Secondary | ICD-10-CM | POA: Insufficient documentation

## 2021-04-25 DIAGNOSIS — Z7982 Long term (current) use of aspirin: Secondary | ICD-10-CM | POA: Insufficient documentation

## 2021-04-25 DIAGNOSIS — Z79899 Other long term (current) drug therapy: Secondary | ICD-10-CM | POA: Insufficient documentation

## 2021-04-25 DIAGNOSIS — G4733 Obstructive sleep apnea (adult) (pediatric): Secondary | ICD-10-CM | POA: Diagnosis not present

## 2021-04-25 DIAGNOSIS — G5601 Carpal tunnel syndrome, right upper limb: Secondary | ICD-10-CM | POA: Insufficient documentation

## 2021-04-25 DIAGNOSIS — Z9989 Dependence on other enabling machines and devices: Secondary | ICD-10-CM | POA: Diagnosis not present

## 2021-04-25 DIAGNOSIS — M75121 Complete rotator cuff tear or rupture of right shoulder, not specified as traumatic: Secondary | ICD-10-CM | POA: Diagnosis not present

## 2021-04-25 DIAGNOSIS — Z96653 Presence of artificial knee joint, bilateral: Secondary | ICD-10-CM | POA: Insufficient documentation

## 2021-04-25 DIAGNOSIS — G8918 Other acute postprocedural pain: Secondary | ICD-10-CM | POA: Diagnosis not present

## 2021-04-25 DIAGNOSIS — M12811 Other specific arthropathies, not elsewhere classified, right shoulder: Secondary | ICD-10-CM | POA: Diagnosis not present

## 2021-04-25 DIAGNOSIS — M19011 Primary osteoarthritis, right shoulder: Secondary | ICD-10-CM | POA: Diagnosis not present

## 2021-04-25 HISTORY — PX: REVERSE SHOULDER ARTHROPLASTY: SHX5054

## 2021-04-25 LAB — TYPE AND SCREEN
ABO/RH(D): A NEG
Antibody Screen: NEGATIVE

## 2021-04-25 IMAGING — DX DG SHOULDER 2+V PORT*R*
1 series · 1 of 1 positions shown · non-contrast
Comparison: Right shoulder radiographs dated [DATE].

CLINICAL DATA: Status post right shoulder surgery.

EXAM:
PORTABLE RIGHT SHOULDER

[shoulder ap]
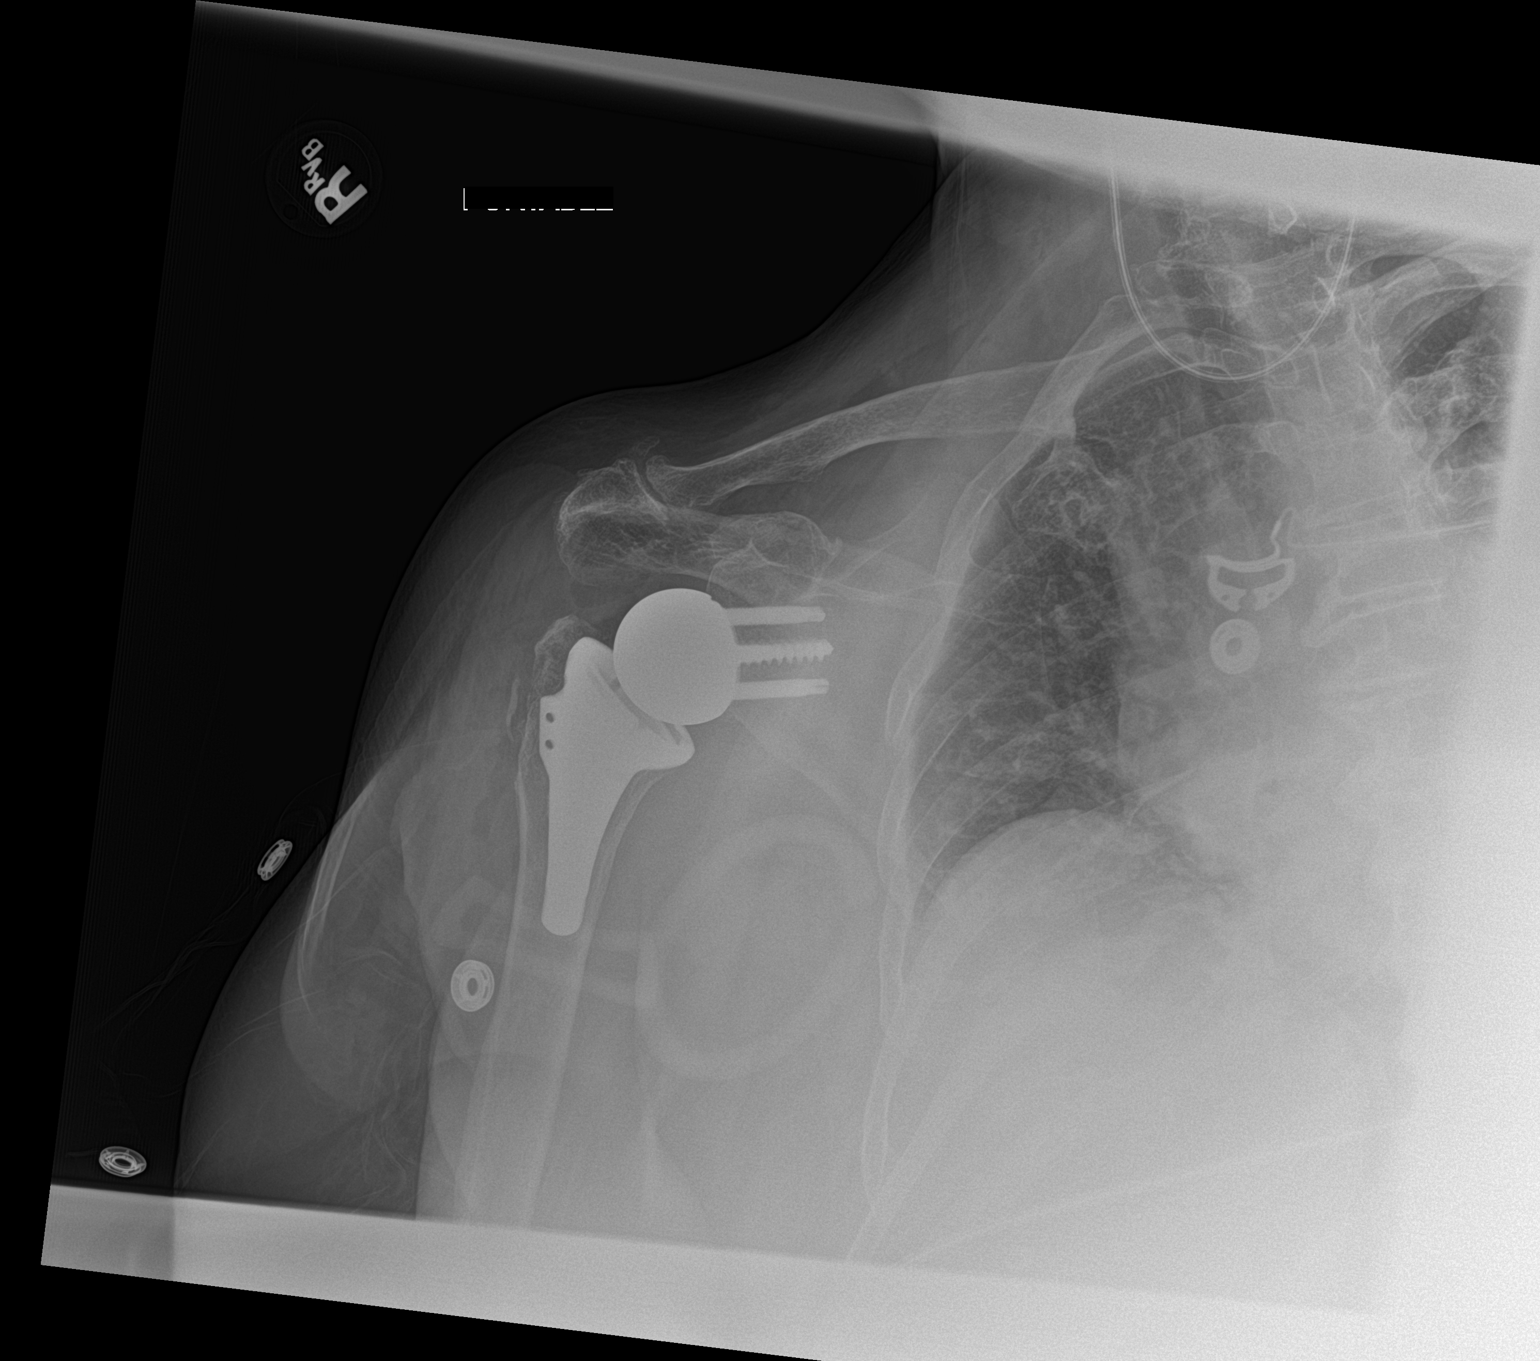

[1 of 1 positions shown; findings below may reference images not displayed]

FINDINGS: The patient is status post a right shoulder arthroplasty. A 1.6 cm
bony fragment is seen just lateral to the proximal humerus, similar
to prior exam. Degenerative changes are seen in the
acromioclavicular joint.
IMPRESSION: Postoperative appearance of right shoulder arthroplasty.

## 2021-04-25 SURGERY — ARTHROPLASTY, SHOULDER, TOTAL, REVERSE
Anesthesia: Regional | Site: Shoulder | Laterality: Right

## 2021-04-25 MED ORDER — BUPIVACAINE HCL (PF) 0.5 % IJ SOLN
INTRAMUSCULAR | Status: DC | PRN
  Administered 2021-04-25: 15 mL via PERINEURAL

## 2021-04-25 MED ORDER — PROPOFOL 1000 MG/100ML IV EMUL
INTRAVENOUS | Status: AC
  Filled 2021-04-25: qty 100

## 2021-04-25 MED ORDER — PHENYLEPHRINE 40 MCG/ML (10ML) SYRINGE FOR IV PUSH (FOR BLOOD PRESSURE SUPPORT)
PREFILLED_SYRINGE | INTRAVENOUS | Status: DC | PRN
  Administered 2021-04-25: 120 ug via INTRAVENOUS

## 2021-04-25 MED ORDER — ROCURONIUM BROMIDE 10 MG/ML (PF) SYRINGE
PREFILLED_SYRINGE | INTRAVENOUS | Status: AC
  Filled 2021-04-25: qty 10

## 2021-04-25 MED ORDER — ONDANSETRON HCL 4 MG/2ML IJ SOLN
INTRAMUSCULAR | Status: DC | PRN
  Administered 2021-04-25: 4 mg via INTRAVENOUS

## 2021-04-25 MED ORDER — LIDOCAINE 2% (20 MG/ML) 5 ML SYRINGE
INTRAMUSCULAR | Status: AC
  Filled 2021-04-25: qty 5

## 2021-04-25 MED ORDER — DEXAMETHASONE SODIUM PHOSPHATE 10 MG/ML IJ SOLN
INTRAMUSCULAR | Status: AC
  Filled 2021-04-25: qty 1

## 2021-04-25 MED ORDER — SCOPOLAMINE 1 MG/3DAYS TD PT72: 1.0000 | MEDICATED_PATCH | TRANSDERMAL | Status: DC

## 2021-04-25 MED ORDER — WATER FOR IRRIGATION, STERILE IR SOLN
Status: DC | PRN
  Administered 2021-04-25: 2000 mL

## 2021-04-25 MED ORDER — PROPOFOL 500 MG/50ML IV EMUL
INTRAVENOUS | Status: DC | PRN
  Administered 2021-04-25: 150 ug/kg/min via INTRAVENOUS

## 2021-04-25 MED ORDER — OXYCODONE HCL 5 MG PO TABS
5.0000 mg | ORAL_TABLET | Freq: Once | ORAL | Status: AC
  Administered 2021-04-25: 5 mg via ORAL

## 2021-04-25 MED ORDER — BUPIVACAINE LIPOSOME 1.3 % IJ SUSP
INTRAMUSCULAR | Status: DC | PRN
  Administered 2021-04-25: 10 mL via PERINEURAL

## 2021-04-25 MED ORDER — PROPOFOL 10 MG/ML IV BOLUS
INTRAVENOUS | Status: AC
  Filled 2021-04-25: qty 20

## 2021-04-25 MED ORDER — MIDAZOLAM HCL 2 MG/2ML IJ SOLN: 1.0000 mg | INTRAMUSCULAR | Status: AC

## 2021-04-25 MED ORDER — FENTANYL CITRATE (PF) 100 MCG/2ML IJ SOLN
INTRAMUSCULAR | Status: AC
  Filled 2021-04-25: qty 2

## 2021-04-25 MED ORDER — CEFAZOLIN SODIUM-DEXTROSE 2-4 GM/100ML-% IV SOLN
2.0000 g | INTRAVENOUS | Status: AC
  Administered 2021-04-25: 2 g via INTRAVENOUS
  Filled 2021-04-25: qty 100

## 2021-04-25 MED ORDER — TIZANIDINE HCL 4 MG PO TABS: 4.0000 mg | ORAL_TABLET | Freq: Three times a day (TID) | ORAL | 1 refills | Status: DC | PRN

## 2021-04-25 MED ORDER — ONDANSETRON HCL 4 MG/2ML IJ SOLN
INTRAMUSCULAR | Status: AC
  Filled 2021-04-25: qty 2

## 2021-04-25 MED ORDER — ROCURONIUM BROMIDE 10 MG/ML (PF) SYRINGE
PREFILLED_SYRINGE | INTRAVENOUS | Status: DC | PRN
  Administered 2021-04-25: 100 mg via INTRAVENOUS

## 2021-04-25 MED ORDER — APREPITANT 40 MG PO CAPS
ORAL_CAPSULE | ORAL | Status: AC
  Administered 2021-04-25: 40 mg via ORAL
  Filled 2021-04-25: qty 1

## 2021-04-25 MED ORDER — TRANEXAMIC ACID-NACL 1000-0.7 MG/100ML-% IV SOLN
1000.0000 mg | INTRAVENOUS | Status: AC
  Administered 2021-04-25: 1000 mg via INTRAVENOUS
  Filled 2021-04-25: qty 100

## 2021-04-25 MED ORDER — 0.9 % SODIUM CHLORIDE (POUR BTL) OPTIME
TOPICAL | Status: DC | PRN
  Administered 2021-04-25: 1000 mL

## 2021-04-25 MED ORDER — ORAL CARE MOUTH RINSE: 15.0000 mL | Freq: Once | OROMUCOSAL | Status: AC

## 2021-04-25 MED ORDER — FENTANYL CITRATE (PF) 100 MCG/2ML IJ SOLN: 50.0000 ug | INTRAMUSCULAR | Status: AC

## 2021-04-25 MED ORDER — PHENYLEPHRINE HCL-NACL 10-0.9 MG/250ML-% IV SOLN
INTRAVENOUS | Status: DC | PRN
  Administered 2021-04-25: 40 ug/min via INTRAVENOUS
  Administered 2021-04-25: 20 ug/min via INTRAVENOUS

## 2021-04-25 MED ORDER — SCOPOLAMINE 1 MG/3DAYS TD PT72
MEDICATED_PATCH | TRANSDERMAL | Status: AC
  Administered 2021-04-25: 1.5 mg via TRANSDERMAL
  Filled 2021-04-25: qty 1

## 2021-04-25 MED ORDER — ACETAMINOPHEN 500 MG PO TABS
1000.0000 mg | ORAL_TABLET | Freq: Once | ORAL | Status: AC
  Administered 2021-04-25: 1000 mg via ORAL
  Filled 2021-04-25: qty 2

## 2021-04-25 MED ORDER — APREPITANT 40 MG PO CAPS: 40.0000 mg | ORAL_CAPSULE | Freq: Once | ORAL | Status: AC

## 2021-04-25 MED ORDER — CHLORHEXIDINE GLUCONATE 0.12 % MT SOLN
15.0000 mL | Freq: Once | OROMUCOSAL | Status: AC
  Administered 2021-04-25: 15 mL via OROMUCOSAL

## 2021-04-25 MED ORDER — PROPOFOL 10 MG/ML IV BOLUS
INTRAVENOUS | Status: DC | PRN
  Administered 2021-04-25: 100 mg via INTRAVENOUS

## 2021-04-25 MED ORDER — PHENYLEPHRINE HCL (PRESSORS) 10 MG/ML IV SOLN
INTRAVENOUS | Status: AC
  Filled 2021-04-25: qty 1

## 2021-04-25 MED ORDER — HYDROCODONE-ACETAMINOPHEN 10-325 MG PO TABS: 1.0000 | ORAL_TABLET | ORAL | 0 refills | Status: DC | PRN

## 2021-04-25 MED ORDER — FENTANYL CITRATE (PF) 100 MCG/2ML IJ SOLN
25.0000 ug | INTRAMUSCULAR | Status: DC | PRN
  Administered 2021-04-25 (×2): 50 ug via INTRAVENOUS

## 2021-04-25 MED ORDER — LIDOCAINE 2% (20 MG/ML) 5 ML SYRINGE
INTRAMUSCULAR | Status: DC | PRN
  Administered 2021-04-25: 20 mg via INTRAVENOUS

## 2021-04-25 MED ORDER — LACTATED RINGERS IV SOLN: INTRAVENOUS | Status: DC

## 2021-04-25 MED ORDER — MIDAZOLAM HCL 2 MG/2ML IJ SOLN
INTRAMUSCULAR | Status: AC
  Administered 2021-04-25: 1 mg via INTRAVENOUS
  Filled 2021-04-25: qty 2

## 2021-04-25 MED ORDER — OXYCODONE HCL 5 MG PO TABS
ORAL_TABLET | ORAL | Status: AC
  Filled 2021-04-25: qty 1

## 2021-04-25 MED ORDER — DEXAMETHASONE SODIUM PHOSPHATE 10 MG/ML IJ SOLN
INTRAMUSCULAR | Status: DC | PRN
  Administered 2021-04-25: 4 mg via INTRAVENOUS

## 2021-04-25 MED ORDER — FENTANYL CITRATE (PF) 100 MCG/2ML IJ SOLN
INTRAMUSCULAR | Status: AC
  Administered 2021-04-25: 50 ug via INTRAVENOUS
  Filled 2021-04-25: qty 2

## 2021-04-25 MED ORDER — SODIUM CHLORIDE 0.9 % IR SOLN
Status: DC | PRN
  Administered 2021-04-25: 1000 mL

## 2021-04-25 MED ORDER — SUGAMMADEX SODIUM 200 MG/2ML IV SOLN
INTRAVENOUS | Status: DC | PRN
  Administered 2021-04-25: 200 mg via INTRAVENOUS

## 2021-04-25 SURGICAL SUPPLY — 86 items
AID PSTN UNV HD RSTRNT DISP (MISCELLANEOUS)
BAG SPEC THK2 15X12 ZIP CLS (MISCELLANEOUS) ×1
BAG ZIPLOCK 12X15 (MISCELLANEOUS) ×2 IMPLANT
BASEPLATE P2 COATD GLND 6.5X30 (Shoulder) IMPLANT
BIT DRILL 1.6MX128 (BIT) IMPLANT
BIT DRILL 2.5 DIA 127 CALI (BIT) ×1 IMPLANT
BIT DRILL 4 DIA CALIBRATED (BIT) ×1 IMPLANT
BLADE SAW SAG 73X25 THK (BLADE) ×1
BLADE SAW SGTL 73X25 THK (BLADE) ×1 IMPLANT
BNDG COHESIVE 2X5 TAN STRL LF (GAUZE/BANDAGES/DRESSINGS) ×1 IMPLANT
BNDG CONFORM 3 STRL LF (GAUZE/BANDAGES/DRESSINGS) ×1 IMPLANT
BOOTIES KNEE HIGH SLOAN (MISCELLANEOUS) ×4 IMPLANT
BSPLAT GLND 30 STRL LF SHLDR (Shoulder) ×1 IMPLANT
COOLER ICEMAN CLASSIC (MISCELLANEOUS) IMPLANT
COVER BACK TABLE 60X90IN (DRAPES) ×2 IMPLANT
COVER SURGICAL LIGHT HANDLE (MISCELLANEOUS) ×2 IMPLANT
COVER WAND RF STERILE (DRAPES) IMPLANT
CUFF TOURN SGL QUICK 18X4 (TOURNIQUET CUFF) ×1 IMPLANT
DRAPE INCISE IOBAN 66X45 STRL (DRAPES) ×2 IMPLANT
DRAPE ORTHO SPLIT 77X108 STRL (DRAPES) ×4
DRAPE POUCH INSTRU U-SHP 10X18 (DRAPES) ×2 IMPLANT
DRAPE SHEET LG 3/4 BI-LAMINATE (DRAPES) ×2 IMPLANT
DRAPE SURG 17X11 SM STRL (DRAPES) ×2 IMPLANT
DRAPE SURG ORHT 6 SPLT 77X108 (DRAPES) ×2 IMPLANT
DRAPE TOP 10253 STERILE (DRAPES) ×2 IMPLANT
DRAPE U-SHAPE 47X51 STRL (DRAPES) ×2 IMPLANT
DRSG AQUACEL AG ADV 3.5X 6 (GAUZE/BANDAGES/DRESSINGS) ×2 IMPLANT
DRSG EMULSION OIL 3X3 NADH (GAUZE/BANDAGES/DRESSINGS) ×1 IMPLANT
DURAPREP 26ML APPLICATOR (WOUND CARE) ×4 IMPLANT
ELECT BLADE TIP CTD 4 INCH (ELECTRODE) ×2 IMPLANT
ELECT REM PT RETURN 15FT ADLT (MISCELLANEOUS) ×2 IMPLANT
GAUZE SPONGE 4X4 12PLY STRL (GAUZE/BANDAGES/DRESSINGS) ×1 IMPLANT
GLOVE SRG 8 PF TXTR STRL LF DI (GLOVE) ×1 IMPLANT
GLOVE SURG ENC MOIS LTX SZ7 (GLOVE) ×2 IMPLANT
GLOVE SURG ENC MOIS LTX SZ7.5 (GLOVE) ×2 IMPLANT
GLOVE SURG UNDER POLY LF SZ7 (GLOVE) ×2 IMPLANT
GLOVE SURG UNDER POLY LF SZ8 (GLOVE) ×2
GOWN STRL REUS W/TWL LRG LVL3 (GOWN DISPOSABLE) ×2 IMPLANT
GOWN STRL REUS W/TWL XL LVL3 (GOWN DISPOSABLE) ×2 IMPLANT
HANDPIECE INTERPULSE COAX TIP (DISPOSABLE) ×2
HOOD PEEL AWAY FLYTE STAYCOOL (MISCELLANEOUS) ×6 IMPLANT
HUMERA STEM SM SHELL SHOU 10 (Miscellaneous) ×2 IMPLANT
INSERT SMALL SOCKET 32MM NEU (Insert) ×1 IMPLANT
KIT BASIN OR (CUSTOM PROCEDURE TRAY) ×2 IMPLANT
KIT TURNOVER KIT A (KITS) ×2 IMPLANT
MANIFOLD NEPTUNE II (INSTRUMENTS) ×2 IMPLANT
NDL TROCAR POINT SZ 2 1/2 (NEEDLE) IMPLANT
NEEDLE TROCAR POINT SZ 2 1/2 (NEEDLE) IMPLANT
NS IRRIG 1000ML POUR BTL (IV SOLUTION) ×2 IMPLANT
P2 COATDE GLNOID BSEPLT 6.5X30 (Shoulder) ×2 IMPLANT
PACK SHOULDER (CUSTOM PROCEDURE TRAY) ×2 IMPLANT
PAD COLD SHLDR WRAP-ON (PAD) IMPLANT
PADDING CAST ABS 4INX4YD NS (CAST SUPPLIES) ×1
PADDING CAST ABS COTTON 4X4 ST (CAST SUPPLIES) IMPLANT
PROTECTOR NERVE ULNAR (MISCELLANEOUS) IMPLANT
RESTRAINT HEAD UNIVERSAL NS (MISCELLANEOUS) IMPLANT
RETRIEVER SUT HEWSON (MISCELLANEOUS) IMPLANT
SCREW BONE LOCKING RSP 5.0X14 (Screw) ×2 IMPLANT
SCREW BONE LOCKING RSP 5.0X30 (Screw) ×4 IMPLANT
SCREW BONE RSP LOCK 5X14 (Screw) IMPLANT
SCREW BONE RSP LOCK 5X18 (Screw) IMPLANT
SCREW BONE RSP LOCK 5X30 (Screw) IMPLANT
SCREW BONE RSP LOCKING 18MM LG (Screw) ×2 IMPLANT
SCREW RETAIN W/HEAD 4MM OFFSET (Shoulder) ×1 IMPLANT
SET HNDPC FAN SPRY TIP SCT (DISPOSABLE) ×1 IMPLANT
SLING ARM FOAM STRAP LRG (SOFTGOODS) ×1 IMPLANT
SLING ARM IMMOBILIZER LRG (SOFTGOODS) IMPLANT
SLING ARM IMMOBILIZER MED (SOFTGOODS) IMPLANT
SPONGE LAP 18X18 RF (DISPOSABLE) ×2 IMPLANT
STEM HUMERAL SM SHELL SHOU 10 (Miscellaneous) IMPLANT
STRIP CLOSURE SKIN 1/2X4 (GAUZE/BANDAGES/DRESSINGS) ×3 IMPLANT
SUCTION FRAZIER HANDLE 10FR (MISCELLANEOUS)
SUCTION TUBE FRAZIER 10FR DISP (MISCELLANEOUS) IMPLANT
SUPPORT WRAP ARM LG (MISCELLANEOUS) IMPLANT
SUT ETHIBOND 2 V 37 (SUTURE) IMPLANT
SUT FIBERWIRE #2 38 REV NDL BL (SUTURE)
SUT MNCRL AB 4-0 PS2 18 (SUTURE) ×2 IMPLANT
SUT PROLENE 4 0 PS 2 18 (SUTURE) ×1 IMPLANT
SUT VIC AB 2-0 CT1 27 (SUTURE) ×2
SUT VIC AB 2-0 CT1 TAPERPNT 27 (SUTURE) ×1 IMPLANT
SUTURE FIBERWR#2 38 REV NDL BL (SUTURE) IMPLANT
TAPE LABRALWHITE 1.5X36 (TAPE) IMPLANT
TAPE SUT LABRALTAP WHT/BLK (SUTURE) IMPLANT
TOWEL OR 17X26 10 PK STRL BLUE (TOWEL DISPOSABLE) ×2 IMPLANT
TOWEL OR NON WOVEN STRL DISP B (DISPOSABLE) ×2 IMPLANT
WATER STERILE IRR 1000ML POUR (IV SOLUTION) ×2 IMPLANT

## 2021-04-25 NOTE — Transfer of Care (Signed)
Immediate Anesthesia Transfer of Care Note  Patient: [name]  Procedure(s) Performed: REVERSE SHOULDER ARTHROPLASTY carpal tunel realase right wrist (Right Shoulder)  Patient Location: PACU  Anesthesia Type:General  Level of Consciousness: awake, alert  and patient cooperative  Airway & Oxygen Therapy: Patient Spontanous Breathing and Patient connected to face mask oxygen  Post-op Assessment: Report given to RN and Post -op Vital signs reviewed and stable  Post vital signs: Reviewed and stable  Last Vitals:  Vitals Value Taken Time  BP 110/45 04/25/21 1212  Temp    Pulse 64 04/25/21 1214  Resp 20 04/25/21 1214  SpO2 100 % 04/25/21 1214  Vitals shown include unvalidated device data.  Last Pain:  Vitals:   04/25/21 0915  TempSrc:   PainSc: Asleep      Patients Stated Pain Goal: 4 (34/03/52 4818)  Complications: No complications documented.

## 2021-04-25 NOTE — Progress Notes (Signed)
Assisted Dr. Hulan Fray with right, ultrasound guided, interscalene  block. Side rails up, monitors on throughout procedure. See vital signs in flow sheet. Tolerated Procedure well. exparel given at this time, card placed in chart and bracelet placed on patient.

## 2021-04-25 NOTE — Anesthesia Procedure Notes (Signed)
Procedure Name: Intubation Date/Time: 04/25/2021 10:31 AM Performed by: Eben Burow, CRNA Pre-anesthesia Checklist: Patient identified, Emergency Drugs available, Suction available, Patient being monitored and Timeout performed Patient Re-evaluated:Patient Re-evaluated prior to induction Oxygen Delivery Method: Circle system utilized Preoxygenation: Pre-oxygenation with 100% oxygen Induction Type: IV induction Ventilation: Mask ventilation without difficulty Laryngoscope Size: Mac and 4 Grade View: Grade I Tube type: Oral Tube size: 7.0 mm Number of attempts: 1 Airway Equipment and Method: Stylet Placement Confirmation: ETT inserted through vocal cords under direct vision,  positive ETCO2 and breath sounds checked- equal and bilateral Secured at: 22 cm Tube secured with: Tape Dental Injury: Teeth and Oropharynx as per pre-operative assessment  Comments: Lips cracked and tongue very dry prior to intubation.  Moisturizer applied to lips.

## 2021-04-25 NOTE — Progress Notes (Signed)
Per PA Danielle, patient does not need OT prior to discharge.

## 2021-04-25 NOTE — H&P (Addendum)
[name] is an 75 y.o. female.   Chief Complaint: R shoulder pain and dysfunction and R hand numbness HPI: s/p fall ~64mo ago with R proximal humerus fracture with rotator cuff disruption.  Associated moderate carpal tunnel syndrome.  Continued pain and pseudoparalysis limit quality of life and have failed conservative treatment.  Past Medical History:  Diagnosis Date  . 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 Arthritis    generalized  . Asthma   . Atrophic vaginitis   . Back pain    DDD/stenosis  . Carotid stenosis    Carotid US 10/16: Plaque RICA (2-11%), normal LICA  . Depression    takes Cymbalta daily  . Diverticulosis    benign  . DJD (degenerative joint disease)   . Dyslipidemia   . Dysrhythmia   . Family history of GI bleeding   . GERD (gastroesophageal reflux disease)    takes Nexium daily  . Gestational diabetes    Pt denies  . H/O hiatal hernia    surgery for hernia  . Headache(784.0)    takes Imitrex daily as needed and Bisoprolol daily;last migraine was about 2wks ago  . History of bronchitis 1 yr ago  . History of shingles   . Insomnia    takes Ambien nightly  . Joint pain   . Joint swelling   . Leg cramps    takes Flexeril daily as needed  . 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 Nocturia   . Osteoporosis   . Peripheral neuropathy    takes Gabapentin daily  . Pneumonia 85yrs ago   hx of  . PONV (postoperative nausea and vomiting)   . Restless leg syndrome    takes Requip daily  . RLS (restless legs syndrome) 08/16/2015  . Sleep apnea    uses CPAP  . Stroke ([place])   . Tubular adenoma of colon   . Vertigo   . Vitamin D deficiency     Past Surgical History:  Procedure Laterality Date  . COLONOSCOPY    . EP IMPLANTABLE DEVICE N/A 11/19/2016   Procedure: Loop Recorder Insertion;  Surgeon: Deboraha Sprang, MD;  Location: [facility];  Service: Cardiovascular;  Laterality: N/A;  . ESOPHAGOGASTRODUODENOSCOPY    . excess skin  removal     post weight loss  . GASTRIC BYPASS  2001  . HERNIA REPAIR     umbilical  . JOINT REPLACEMENT Right    knee  . skin reduction  2003 2004   stomach and arm  . STEROID INJECTION TO SCAR     x 2 in back   . TOTAL KNEE ARTHROPLASTY Left 04/03/2015   Procedure: LEFT TOTAL KNEE ARTHROPLASTY;  Surgeon: Melrose Nakayama, MD;  Location: [facility];  Service: Orthopedics;  Laterality: Left;  . WISDOM TOOTH EXTRACTION      Family History  Problem Relation Age of Onset  . CAD Mother   . Hypertension Mother   . Neuropathy Mother   . Osteoarthritis Mother   . Heart disease Mother   . Breast cancer Maternal Grandmother   . CAD Paternal Grandfather   . Colon cancer Father    Social History:  reports that she has never smoked. She has never used smokeless tobacco. She reports current alcohol use. She reports that she does not use drugs.  Allergies:  Allergies  Allergen Reactions  . Atorvastatin     Other reaction(s): myalgia  . Ezetimibe  Other reaction(s): myalgia    Facility-Administered Medications Prior to Admission  Medication Dose Route Frequency Provider Last Rate Last Admin  . Erenumab-aooe SOAJ 140 mg  140 mg Subcutaneous Once Melvenia Beam, MD       Medications Prior to Admission  Medication Sig Dispense Refill  . baclofen (LIORESAL) 10 MG tablet TAKE 1 TABLET(10 MG) BY MOUTH TWICE DAILY 60 tablet 0  . bisoprolol (ZEBETA) 10 MG tablet Take 10 mg by mouth 2 (two) times daily.    . calcium carbonate (OS-CAL) 1250 (500 CA) MG chewable tablet Chew 1 tablet by mouth daily. Not sure of dosage    . diclofenac sodium (VOLTAREN) 1 % GEL Apply 4 g topically 4 (four) times daily. 100 g 11  . DULoxetine (CYMBALTA) 60 MG capsule Take 60 mg by mouth daily after supper.  11  . esomeprazole (NEXIUM) 40 MG capsule Take 40 mg by mouth 2 (two) times daily before a meal.   6  . fluticasone (FLONASE) 50 MCG/ACT nasal spray 2 sprays in each nostril    . gabapentin (NEURONTIN) 600 MG  tablet Take 1 tablet by mouth 2 (two) times daily.    Marland Kitchen HYDROcodone-acetaminophen (NORCO) 10-325 MG tablet Take 1 tablet by mouth every 8 (eight) hours as needed for moderate pain or severe pain. Takes for migraines    . Loratadine 10 MG CAPS Take 1 capsule by mouth as needed (for allergies).     . nystatin (MYCOSTATIN) 100000 UNIT/ML suspension 5 ml    . PROAIR HFA 108 (90 Base) MCG/ACT inhaler 2 INHALATIONS EVERY 4 HOURS AS NEEDED FOR ASTHMA SYMPTOMS (WHEEZING/SHORTNESS OF BREATH)  5  . quiNINE (QUALAQUIN) 324 MG capsule TAKE ONE CAPSULE BY MOUTH TWICE A DAY 180 capsule 3  . rOPINIRole (REQUIP) 1 MG tablet TAKE 1 TABLET(1 MG) BY MOUTH TWICE DAILY BEFORE LUNCH AND SUPPER 180 tablet 3  . Vitamin D, Ergocalciferol, (DRISDOL) 50000 units CAPS capsule Take 1 capsule (50,000 Units total) by mouth every 7 (seven) days. 30 capsule 3  . zolpidem (AMBIEN) 10 MG tablet Take 10 mg by mouth at bedtime.    Marland Kitchen amphetamine-dextroamphetamine (ADDERALL) 10 MG tablet Take 1 tablet (10 mg total) by mouth daily before breakfast. 30 tablet 0  . ASPIRIN 81 PO Take 81 mg by mouth at bedtime.    Eduard Roux (AIMOVIG) 140 MG/ML SOAJ Inject 140 mg into the skin every 30 (thirty) days. 1 mL 1  . Evolocumab (REPATHA SURECLICK) 419 MG/ML SOAJ Inject 1 pen into the skin every 14 (fourteen) days. 2 mL 11  . ezetimibe (ZETIA) 10 MG tablet Take 1 tablet by mouth daily.    . furosemide (LASIX) 20 MG tablet Take 40 mg by mouth daily as needed for fluid or edema. (Patient not taking: No sig reported)    . ondansetron (ZOFRAN-ODT) 4 MG disintegrating tablet DISSOLVE 1 TABLET(4 MG) ON THE TONGUE EVERY 8 HOURS AS NEEDED FOR NAUSEA OR VOMITING 45 tablet 2  . Pitavastatin Calcium (LIVALO) 2 MG TABS 1 tablet      Results for orders placed or performed during the hospital encounter of 04/23/21 (from the past 48 hour(s))  SARS CORONAVIRUS 2 (TAT 6-24 HRS) Nasopharyngeal Nasopharyngeal Swab     Status: None   Collection Time: 04/23/21   2:46 PM   Specimen: Nasopharyngeal Swab  Result Value Ref Range   SARS Coronavirus 2 NEGATIVE NEGATIVE    Comment: (NOTE) SARS-CoV-2 target nucleic acids are NOT DETECTED.  The SARS-CoV-2 RNA  is generally detectable in upper and lower respiratory specimens during the acute phase of infection. Negative results do not preclude SARS-CoV-2 infection, do not rule out co-infections with other pathogens, and should not be used as the sole basis for treatment or other patient management decisions. Negative results must be combined with clinical observations, patient history, and epidemiological information. The expected result is Negative.  Fact Sheet for Patients: SugarRoll.be  Fact Sheet for Healthcare Providers: https://www.woods-mathews.com/  This test is not yet approved or cleared by the [place] FDA and  has been authorized for detection and/or diagnosis of SARS-CoV-2 by FDA under an Emergency Use Authorization (EUA). This EUA will remain  in effect (meaning this test can be used) for the duration of the COVID-19 declaration under Se ction 564(b)(1) of the Act, 21 U.S.C. section 360bbb-3(b)(1), unless the authorization is terminated or revoked sooner.  Performed at [facility], [place] [address] [place],  [address]    No results found.  Review of Systems  All other systems reviewed and are negative.   Blood pressure 135/67, pulse 66, temperature 98.7 F (37.1 C), temperature source Oral, resp. rate 17, height 5\' 6"  (1.676 m), weight 105.7 kg, SpO2 93 %. Physical Exam HENT:     Head: Atraumatic.  Eyes:     Extraocular Movements: Extraocular movements intact.  Cardiovascular:     Pulses: Normal pulses.  Pulmonary:     Effort: Pulmonary effort is normal.  Musculoskeletal:     Comments: R shoulder pain with very limited motion. NVID.  Skin:    General: Skin is warm.  Neurological:     Mental Status: She is  alert.  Psychiatric:        Mood and Affect: Mood normal.      Assessment/Plan s/p fall ~2mo ago with R proximal humerus fracture with rotator cuff disruption.  Continued pain and pseudoparalysis limit quality of life and have failed conservative treatment.  Associated carpal tunnel syndrome. Plan R reverse TSA and carpal tunnel release. Risks / benefits of surgery discussed Consent on chart  NPO for OR Preop antibiotics   Isabella Stalling, MD 04/25/2021, 9:31 AM

## 2021-04-25 NOTE — Discharge Instructions (Signed)
Discharge Instructions after Reverse Total Shoulder Arthroplasty   . A sling has been provided for you. You are to wear this at all times (except for bathing and dressing), until your first post operative visit with Dr. Chandler. Please also wear while sleeping at night. While you bath and dress, let the arm/elbow extend straight down to stretch your elbow. Wiggle your fingers and pump your first while your in the sling to prevent hand swelling. . Use ice on the shoulder intermittently over the first 48 hours after surgery. Continue to use ice or and ice machine as needed after 48 hours for pain control/swelling.  . Pain medicine has been prescribed for you.  . Use your medicine liberally over the first 48 hours, and then you can begin to taper your use. You may take Extra Strength Tylenol or Tylenol only in place of the pain pills. DO NOT take ANY nonsteroidal anti-inflammatory pain medications: Advil, Motrin, Ibuprofen, Aleve, Naproxen or Naprosyn.  . Take one aspirin a day for 2 weeks after surgery, unless you have an aspirin sensitivity/allergy or asthma.  . Leave your dressing on until your first follow up visit.  You may shower with the dressing.  Hold your arm as if you still have your sling on while you shower. . Simply allow the water to wash over the site and then pat dry. Make sure your axilla (armpit) is completely dry after showering.    Please call [phone] during normal business hours or [phone] after hours for any problems. Including the following:  - excessive redness of the incisions - drainage for more than 4 days - fever of more than 101.5 F  *Please note that pain medications will not be refilled after hours or on weekends.    Dental Antibiotics:  In most cases prophylactic antibiotics for Dental procdeures after total joint surgery are not necessary.  Exceptions are as follows:  1. History of prior total joint infection  2. Severely immunocompromised  (Organ Transplant, cancer chemotherapy, Rheumatoid biologic meds such as Humera)  3. Poorly controlled diabetes (A1C &gt; 8.0, blood glucose over 200)  If you have one of these conditions, contact your surgeon for an antibiotic prescription, prior to your dental procedure. 

## 2021-04-25 NOTE — Op Note (Signed)
Procedure(s): REVERSE SHOULDER ARTHROPLASTY carpal tunel realase right wrist Procedure Note  Harlene Petralia Malena female 75 y.o. 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 female  with irrepairable rotator cuff tear associated with chronic nonunion of greater tuberosity fracture. Pain and dysfunction interfered with quality of life and nonoperative treatment with activity modification, NSAIDS and injections failed.     Surgeon: Isabella Stalling   Assistants: Jeanmarie Hubert PA-C [facility] was present and scrubbed throughout the procedure and was essential in positioning, retraction, exposure, and closure)  Anesthesia: General endotracheal anesthesia with preoperative interscalene block given by the attending anesthesiologist     Procedure Detail  REVERSE SHOULDER ARTHROPLASTY carpal tunel realase right wrist   Estimated Blood Loss:  200 mL         Drains: none  Blood Given: none          Specimens: none        Complications:  * No complications entered in OR log *         Disposition: PACU - hemodynamically stable.         Condition: stable      OPERATIVE FINDINGS:  The transcarpal ligament was completely released.  It was noted to be very thick centrally.  The underlying nerve looked healthy. A DJO Altivate pressfit reverse total shoulder arthroplasty was placed with a  size 8 stem, a 32-4 glenosphere, and a standard poly insert. The base plate  fixation was very good.  PROCEDURE: The patient was identified in the preoperative holding area  where I personally marked the operative site after verifying site, side,  and procedure with the patient. An interscalene block  given by  the attending anesthesiologist in the holding area and the patient was taken back to the operating room where all extremities were  carefully padded in position after general anesthesia was induced. She  was placed in a beach-chair position and the operative upper extremity was  prepped and draped in a standard sterile fashion. An approximately 10-  cm incision was made from the tip of the coracoid process to the center  point of the humerus at the level of the axilla. Dissection was carried  down through subcutaneous tissues to the level of the cephalic vein  which was taken laterally with the deltoid. The pectoralis major was  retracted medially. The subdeltoid space was developed and the lateral  edge of the conjoined tendon was identified. The undersurface of  conjoined tendon was palpated and the musculocutaneous nerve was not in  the field. Retractor was placed underneath the conjoined and second  retractor was placed lateral into the deltoid. The circumflex humeral  artery and vessels were identified and clamped and coagulated. The  biceps tendon was absent.  The subscapularis was taken down as a peel with the underlying capsule.  The  joint was then gently externally rotated while the capsule was released  from the humeral neck around to just beyond the 6 o'clock position. At  this point, the joint was dislocated and the humeral head was presented  into the wound.  I was able to palpate the posterior tuberosity fragment which was around fairly posterior and medial.  I did not feel that this fragment  would impinge on the implant or cause any issues remaining in place and the additional exposure necessary to adequately resect it was not worthwhile.  The cutting guide was used to make the appropriate  head cut and the head was saved for potentially bone grafting.  The glenoid was exposed with the arm in an  abducted extended position. The anterior and posterior labrum were   completely excised and the capsule was released circumferentially to  allow for exposure of the glenoid for preparation. The 2.5 mm drill was  placed using the guide in 5-10 inferior angulation and the tap was then advanced in the same hole. Small and large reamers were then used. The tap was then removed and the Metaglene was then screwed in with very good purchase.  The peripheral guide was then used to drilled measured and filled peripheral locking screws. The size 32-4 glenosphere was then impacted on the [facility] taper and the central screw was placed. The humerus was then again exposed and the diaphyseal reamers were used followed by the metaphyseal reamers. The final broach was left in place in the proximal trial was placed. The joint was reduced and with this implant it was felt that soft tissue tensioning was appropriate with excellent stability and excellent range of motion. Therefore, final humeral stem was placed press-fit.  And then the trial polyethylene inserts were tested again and the above implant was felt to be the most appropriate for final insertion. The joint was reduced taken through full range of motion and felt to be stable. Soft tissue tension was appropriate.  The joint was then copiously irrigated with pulse  lavage and the wound was then closed. The subscapularis was not repaired.  Skin was closed with 2-0 Vicryl in a deep dermal layer and 4-0  Monocryl for skin closure. Steri-Strips were applied. Sterile  dressings were then applied as well as a sling. The patient was allowed  to awaken from general anesthesia, transferred to stretcher, and taken  to recovery room in stable condition.   POSTOPERATIVE PLAN: The patient will be observed in the recovery room.  If she is hemodynamically stable and her pain is well controlled she could be discharged home today with family.  If there is any concern we can keep her overnight for observation.

## 2021-04-25 NOTE — Anesthesia Procedure Notes (Signed)
Anesthesia Regional Block: Interscalene brachial plexus block   Pre-Anesthetic Checklist: ,, timeout performed, Correct Patient, Correct Site, Correct Laterality, Correct Procedure, Correct Position, site marked, Risks and benefits discussed,  Surgical consent,  Pre-op evaluation,  At surgeon's request and post-op pain management  Laterality: Right  Prep: Maximum Sterile Barrier Precautions used, chloraprep       Needles:  Injection technique: Single-shot  Needle Type: Echogenic Stimulator Needle     Needle Length: 4cm  Needle Gauge: 22     Additional Needles:   Procedures:,,,, ultrasound used (permanent image in chart),,,,  Narrative:  Start time: 04/25/2021 9:02 AM End time: 04/25/2021 9:12 AM Injection made incrementally with aspirations every 5 mL.  Performed by: Personally  Anesthesiologist: Freddrick March, MD  Additional Notes: Monitors applied. No increased pain on injection. No increased resistance to injection. Injection made in 5cc increments. Good needle visualization. Patient tolerated procedure well.

## 2021-04-26 NOTE — Anesthesia Postprocedure Evaluation (Signed)
Anesthesia Post Note  Patient: [name]  Procedure(s) Performed: REVERSE SHOULDER ARTHROPLASTY carpal tunel realase right wrist (Right Shoulder)     Patient location during evaluation: PACU Anesthesia Type: Regional and General Level of consciousness: awake and alert Pain management: pain level controlled Vital Signs Assessment: post-procedure vital signs reviewed and stable Respiratory status: spontaneous breathing, nonlabored ventilation, respiratory function stable and patient connected to nasal cannula oxygen Cardiovascular status: blood pressure returned to baseline and stable Postop Assessment: no apparent nausea or vomiting Anesthetic complications: no   No complications documented.  Last Vitals:  Vitals:   04/25/21 1400 04/25/21 1415  BP: (!) 121/57 101/65  Pulse: 78 72  Resp: (!) 23   Temp: (!) 36.4 C   SpO2: 93% 91%    Last Pain:  Vitals:   04/25/21 1400  TempSrc:   PainSc: 2                  [name] L [name]

## 2021-04-29 ENCOUNTER — Encounter (HOSPITAL_COMMUNITY): Payer: Self-pay | Admitting: Orthopedic Surgery

## 2021-04-29 DIAGNOSIS — R0781 Pleurodynia: Secondary | ICD-10-CM | POA: Diagnosis not present

## 2021-05-02 DIAGNOSIS — H43819 Vitreous degeneration, unspecified eye: Secondary | ICD-10-CM | POA: Diagnosis not present

## 2021-05-02 DIAGNOSIS — H52223 Regular astigmatism, bilateral: Secondary | ICD-10-CM | POA: Diagnosis not present

## 2021-05-02 DIAGNOSIS — H5203 Hypermetropia, bilateral: Secondary | ICD-10-CM | POA: Diagnosis not present

## 2021-05-02 DIAGNOSIS — H524 Presbyopia: Secondary | ICD-10-CM | POA: Diagnosis not present

## 2021-05-02 DIAGNOSIS — H43399 Other vitreous opacities, unspecified eye: Secondary | ICD-10-CM | POA: Diagnosis not present

## 2021-05-02 DIAGNOSIS — H25813 Combined forms of age-related cataract, bilateral: Secondary | ICD-10-CM | POA: Diagnosis not present

## 2021-05-02 DIAGNOSIS — H2513 Age-related nuclear cataract, bilateral: Secondary | ICD-10-CM | POA: Diagnosis not present

## 2021-05-08 DIAGNOSIS — Z96611 Presence of right artificial shoulder joint: Secondary | ICD-10-CM | POA: Diagnosis not present

## 2021-05-08 DIAGNOSIS — Z471 Aftercare following joint replacement surgery: Secondary | ICD-10-CM | POA: Diagnosis not present

## 2021-05-15 ENCOUNTER — Telehealth: Payer: Self-pay | Admitting: Neurology

## 2021-05-15 DIAGNOSIS — D501 Sideropenic dysphagia: Secondary | ICD-10-CM

## 2021-05-15 DIAGNOSIS — R790 Abnormal level of blood mineral: Secondary | ICD-10-CM

## 2021-05-15 DIAGNOSIS — E611 Iron deficiency: Secondary | ICD-10-CM

## 2021-05-15 DIAGNOSIS — G2581 Restless legs syndrome: Secondary | ICD-10-CM

## 2021-05-15 NOTE — Telephone Encounter (Signed)
Pt believes she may be allergic to gabapentin (NEURONTIN) 600 MG tablet because she breaks out in hives.  Pt states she is in a lot of pain and would like something called in for her, please call.

## 2021-05-16 ENCOUNTER — Other Ambulatory Visit (INDEPENDENT_AMBULATORY_CARE_PROVIDER_SITE_OTHER): Payer: Self-pay

## 2021-05-16 ENCOUNTER — Other Ambulatory Visit: Payer: Self-pay | Admitting: Neurology

## 2021-05-16 DIAGNOSIS — R790 Abnormal level of blood mineral: Secondary | ICD-10-CM | POA: Diagnosis not present

## 2021-05-16 DIAGNOSIS — D508 Other iron deficiency anemias: Secondary | ICD-10-CM | POA: Diagnosis not present

## 2021-05-16 DIAGNOSIS — K5080 Crohn's disease of both small and large intestine without complications: Secondary | ICD-10-CM | POA: Diagnosis not present

## 2021-05-16 DIAGNOSIS — E611 Iron deficiency: Secondary | ICD-10-CM | POA: Diagnosis not present

## 2021-05-16 DIAGNOSIS — Z0289 Encounter for other administrative examinations: Secondary | ICD-10-CM

## 2021-05-16 DIAGNOSIS — G2581 Restless legs syndrome: Secondary | ICD-10-CM

## 2021-05-16 DIAGNOSIS — D501 Sideropenic dysphagia: Secondary | ICD-10-CM | POA: Diagnosis not present

## 2021-05-16 DIAGNOSIS — D519 Vitamin B12 deficiency anemia, unspecified: Secondary | ICD-10-CM | POA: Diagnosis not present

## 2021-05-16 DIAGNOSIS — K902 Blind loop syndrome, not elsewhere classified: Secondary | ICD-10-CM | POA: Diagnosis not present

## 2021-05-16 MED ORDER — QUININE SULFATE 324 MG PO CAPS: ORAL_CAPSULE | ORAL | 5 refills | Status: DC

## 2021-05-16 NOTE — Telephone Encounter (Signed)
The patient and advised of Dr. Edwena Felty recommendation.  Advised the patient she would like to see her labs first.  Patient will come in to have her lab work drawn.  I will check with the infusion suite in regards to the process for getting her approved have an infusion.  Her ferritin level was low when it was drawn in March.

## 2021-05-16 NOTE — Telephone Encounter (Signed)
The infusion suite is able to get patient in today at 2:00 for a iron infusion.  Dr. Brett Fairy would still like to have lab work completed prior to the infusion so that we have a baseline.  Once she has received the IV infusion, Dr. Brett Fairy would like to repeat labs in 2 weeks.

## 2021-05-16 NOTE — Telephone Encounter (Signed)
Patient stated that after her surgery she was told to resume gabapentin for her nerve/neuropathy discomfort.  Patient stated that once she started the gabapentin back she developed a rash all over her body.  Patient states she had this reaction previously when she has used that before. Patient is concerned because her legs and feet feel like they are in boiling water and are hurting her really bad.  She describes needles shooting pains in her lower extremities.  Patient has not been taking the quinine sulfate because of insurance not covering.  Encouraged the patient to still get the medication through [facility].  Advised that [facility] has the medication anywhere from $83-85 for 30-day supply. Advised an appeal letter can be attempted for the patient to see if they will allow coverage but we had done that before and it was denied.   Pt takes cymbalta, ropinerole and will restart the Quinine but she was asking what other medication she can take. She was told there was one she could take but it may interfere with [place] (Dr Dohmeier states this is lyrica) pt has used Lyrica in the past and does not remember why this was stopped. She is open to anything.  Dr Brett Fairy would like to have her recheck her iron and ferritin. Will place the order and encourage the patient to come have that drawn.

## 2021-05-16 NOTE — Telephone Encounter (Signed)
Called the patient back

## 2021-05-16 NOTE — Addendum Note (Signed)
Addended by: Darleen Crocker on: 05/16/2021 07:25 AM   Modules accepted: Orders

## 2021-05-17 LAB — IRON,TIBC AND FERRITIN PANEL
Ferritin: 62 ng/mL (ref 15–150)
Iron Saturation: 16 % (ref 15–55)
Iron: 50 ug/dL (ref 27–139)
Total Iron Binding Capacity: 311 ug/dL (ref 250–450)
UIBC: 261 ug/dL (ref 118–369)

## 2021-05-17 NOTE — Progress Notes (Signed)
Very low normal iron saturation and rather good ferritin levels for this patient. Will see if iron dose will make a difference over the next 14 days, if not will need to check for deficiency in trace elements/ malabsorption.

## 2021-05-20 ENCOUNTER — Telehealth: Payer: Self-pay

## 2021-05-20 NOTE — Telephone Encounter (Signed)
Very low normal iron saturation and rather good ferritin levels for this patient. Will see if iron dose will make a difference over the next 14 days, if not will need to check for deficiency in trace elements/ malabsorption.  Written by Larey Seat, MD on 05/17/2021 11:37 AM EDT Pt verbalized understanding.

## 2021-05-28 ENCOUNTER — Ambulatory Visit (INDEPENDENT_AMBULATORY_CARE_PROVIDER_SITE_OTHER): Payer: Medicare Other | Admitting: Neurology

## 2021-05-28 ENCOUNTER — Other Ambulatory Visit: Payer: Self-pay

## 2021-05-28 ENCOUNTER — Other Ambulatory Visit: Payer: Self-pay | Admitting: Neurology

## 2021-05-28 DIAGNOSIS — D501 Sideropenic dysphagia: Secondary | ICD-10-CM

## 2021-05-28 DIAGNOSIS — G43711 Chronic migraine without aura, intractable, with status migrainosus: Secondary | ICD-10-CM | POA: Diagnosis not present

## 2021-05-28 DIAGNOSIS — E611 Iron deficiency: Secondary | ICD-10-CM

## 2021-05-28 DIAGNOSIS — R790 Abnormal level of blood mineral: Secondary | ICD-10-CM

## 2021-05-28 NOTE — Progress Notes (Signed)
Botox- 200 units x 1 vial Lot: [id number] Expiration: 11/2023 NDC: 8333-8329-19  Bacteriostatic 0.9% Sodium Chloride- 70mL total Lot: [id number] Expiration: 05/15/2022 NDC: 0459-9774-14  Dx: E39.532 B/B

## 2021-05-28 NOTE — Progress Notes (Signed)
Consent Form Botulism Toxin Injection For Chronic Migraine  05/28/2021: stable, doing great Interval history 02/19/2021: Patient's migraines improved on botox > 80% in and frequency. +5 orb Oculi an d+5 masseters each. Gave her 5 months of aimovig.   Reviewed orally with patient, additionally signature is on file:  Botulism toxin has been approved by the Federal drug administration for treatment of chronic migraine. Botulism toxin does not cure chronic migraine and it may not be effective in some patients.  The administration of botulism toxin is accomplished by injecting a small amount of toxin into the muscles of the neck and head. Dosage must be titrated for each individual. Any benefits resulting from botulism toxin tend to wear off after 3 months with a repeat injection required if benefit is to be maintained. Injections are usually done every 3-4 months with maximum effect peak achieved by about 2 or 3 weeks. Botulism toxin is expensive and you should be sure of what costs you will incur resulting from the injection.  The side effects of botulism toxin use for chronic migraine may include:   -Transient, and usually mild, facial weakness with facial injections  -Transient, and usually mild, head or neck weakness with head/neck injections  -Reduction or loss of forehead facial animation due to forehead muscle weakness  -Eyelid drooping  -Dry eye  -Pain at the site of injection or bruising at the site of injection  -Double vision  -Potential unknown long term risks  Contraindications: You should not have Botox if you are pregnant, nursing, allergic to albumin, have an infection, skin condition, or muscle weakness at the site of the injection, or have myasthenia gravis, Lambert-Eaton syndrome, or ALS.  It is also possible that as with any injection, there may be an allergic reaction or no effect from the medication. Reduced effectiveness after repeated injections is sometimes seen and  rarely infection at the injection site may occur. All care will be taken to prevent these side effects. If therapy is given over a long time, atrophy and wasting in the muscle injected may occur. Occasionally the patient's become refractory to treatment because they develop antibodies to the toxin. In this event, therapy needs to be modified.  I have read the above information and consent to the administration of botulism toxin.    BOTOX PROCEDURE NOTE FOR MIGRAINE HEADACHE    Contraindications and precautions discussed with patient(above). Aseptic procedure was observed and patient tolerated procedure. Procedure performed by Dr. Georgia Dom  The condition has existed for more than 6 months, and pt does not have a diagnosis of ALS, Myasthenia Gravis or Lambert-Eaton Syndrome.  Risks and benefits of injections discussed and pt agrees to proceed with the procedure.  Written consent obtained  These injections are medically necessary. Pt  receives good benefits from these injections. These injections do not cause sedations or hallucinations which the oral therapies may cause.  Description of procedure:  The patient was placed in a sitting position. The standard protocol was used for Botox as follows, with 5 units of Botox injected at each site:   -Procerus muscle, midline injection  -Corrugator muscle, bilateral injection  -Frontalis muscle, bilateral injection, with 2 sites each side, medial injection was performed in the upper one third of the frontalis muscle, in the region vertical from the medial inferior edge of the superior orbital rim. The lateral injection was again in the upper one third of the forehead vertically above the lateral limbus of the cornea, 1.5 cm lateral to  the medial injection site.  -Temporalis muscle injection, 5 sites, bilaterally. The first injection was 3 cm above the tragus of the ear, second injection site was 1.5 cm to 3 cm up from the first injection site in  line with the tragus of the ear. The third injection site was 1.5-3 cm forward between the first 2 injection sites. The fourth injection site was 1.5 cm posterior to the second injection site. 5th site laterally in the temporalis  muscleat the level of the outer canthus.  - Patient feels her clenching is a trigger for headaches. +5 units masseter bilaterally   - Patient feels the migraines are centered around the eyes +5 units bilaterally at the outer canthus in the orbicularis occuli  -Occipitalis muscle injection, 3 sites, bilaterally. The first injection was done one half way between the occipital protuberance and the tip of the mastoid process behind the ear. The second injection site was done lateral and superior to the first, 1 fingerbreadth from the first injection. The third injection site was 1 fingerbreadth superiorly and medially from the first injection site.  -Cervical paraspinal muscle injection, 2 sites, bilateral knee first injection site was 1 cm from the midline of the cervical spine, 3 cm inferior to the lower border of the occipital protuberance. The second injection site was 1.5 cm superiorly and laterally to the first injection site.  -Trapezius muscle injection was performed at 3 sites, bilaterally. The first injection site was in the upper trapezius muscle halfway between the inflection point of the neck, and the acromion. The second injection site was one half way between the acromion and the first injection site. The third injection was done between the first injection site and the inflection point of the neck.   Will return for repeat injection in 3 months.   A 200 unit sof Botox was used, any Botox not injected was wasted. The patient tolerated the procedure well, there were no complications of the above procedure.

## 2021-05-28 NOTE — Telephone Encounter (Signed)
Patient presented today for her appointment with Dr. [name].  In previous discussions with patient felt that the gabapentin was causing her to have a rash breakout.  She has resumed the gabapentin at a lower dose and is taking 300 mg 4 times a day.  She states that she feels this is helping with her discomfort as well as not making her feel head foggy.  She denies any rash breakout.  She is asking if she can go back to taking the gabapentin.  If Dr. Brett Fairy agrees with this she would like me to send a 300 mg dose tablet to take 4 times a day rather than breaking her current tablets in half. Advised the patient I would send the medication to the pharmacy for her with Dr. Brett Fairy agrees with plan

## 2021-05-28 NOTE — Addendum Note (Signed)
Addended by: Darleen Crocker on: 05/28/2021 01:45 PM   Modules accepted: Orders

## 2021-05-29 LAB — IRON AND TIBC
Iron Saturation: 18 % (ref 15–55)
Iron: 49 ug/dL (ref 27–139)
Total Iron Binding Capacity: 273 ug/dL (ref 250–450)
UIBC: 224 ug/dL (ref 118–369)

## 2021-05-29 LAB — FERRITIN: Ferritin: 131 ng/mL (ref 15–150)

## 2021-05-29 MED ORDER — PREDNISONE 10 MG PO TABS: 10.0000 mg | ORAL_TABLET | Freq: Every day | ORAL | 0 refills | Status: DC

## 2021-05-29 NOTE — Telephone Encounter (Signed)
D/c gabapentin-  Steroid low dose to help temporarily with pain- this can be used 10 mg in AM for 7 days. And topical cream if itching.

## 2021-05-29 NOTE — Addendum Note (Signed)
Addended by: Larey Seat on: 05/29/2021 05:01 PM   Modules accepted: Orders

## 2021-05-30 ENCOUNTER — Encounter: Payer: Self-pay | Admitting: Neurology

## 2021-05-30 ENCOUNTER — Other Ambulatory Visit: Payer: Self-pay | Admitting: Neurology

## 2021-05-30 MED ORDER — GABAPENTIN 300 MG PO CAPS: 300.0000 mg | ORAL_CAPSULE | Freq: Four times a day (QID) | ORAL | 5 refills | Status: DC

## 2021-05-30 NOTE — Addendum Note (Signed)
Addended by: Darleen Crocker on: 05/30/2021 09:03 AM   Modules accepted: Orders

## 2021-05-30 NOTE — Telephone Encounter (Signed)
Discussed with Dr Brett Fairy to advise that the gabapentin at lower dosage 4 times a day is helpful for the patient. She is not in as much pain and steroid would not be needed. I will send a script for gabapentin 300 mg QID for the pt.

## 2021-06-03 ENCOUNTER — Telehealth: Payer: Self-pay | Admitting: Neurology

## 2021-06-03 NOTE — Telephone Encounter (Signed)
Pt called, can not sleep at night because of my RLS. Taking Gabapentin, do I need to up the dosage. Would like a call from the nurse.

## 2021-06-04 ENCOUNTER — Encounter: Payer: Self-pay | Admitting: Neurology

## 2021-06-04 MED ORDER — ROPINIROLE HCL 1 MG PO TABS: ORAL_TABLET | ORAL | 3 refills | Status: DC

## 2021-06-04 NOTE — Telephone Encounter (Signed)
Attempted to call the pt. There was no answer. VM was full. Will send a mychart message.

## 2021-06-04 NOTE — Telephone Encounter (Signed)
I called the patient.  The patient basically has restless leg symptoms throughout the day, she takes 1 mg in the morning, 1 mg around 2 PM, and 2 mg at bedtime.  With this regimen, she still has a lot of symptoms.  We will increase the Requip to 1 in the morning, 2 in the mid afternoon and 2 in the evening of the 1 mg tablets and eventually go to 2 mg 3 times daily.

## 2021-06-04 NOTE — Addendum Note (Signed)
Addended by: Kathrynn Ducking on: 06/04/2021 04:17 PM   Modules accepted: Orders

## 2021-06-04 NOTE — Telephone Encounter (Signed)
Pt returned call. She states that she is struggling with her RLS and feet burning at night.  The requip 1 mg is ordered at this time for max of 3 tab/24 hrs.  Pt states that she usually takes 2 tablets at bedtime and around 5 am she wakes up and takes 1. She is finding that around early afternoon, this becomes increasingly bothersome and she wanted to know if it would be ok to take another requip at that time.  I advised that at this time Dr Brett Fairy is out of the office and that I will have to sent the question to our work in [place].  She was appreciative for the call back.

## 2021-06-05 DIAGNOSIS — I872 Venous insufficiency (chronic) (peripheral): Secondary | ICD-10-CM | POA: Diagnosis not present

## 2021-06-05 DIAGNOSIS — G4700 Insomnia, unspecified: Secondary | ICD-10-CM | POA: Diagnosis not present

## 2021-06-05 DIAGNOSIS — Z471 Aftercare following joint replacement surgery: Secondary | ICD-10-CM | POA: Diagnosis not present

## 2021-06-05 DIAGNOSIS — Z8669 Personal history of other diseases of the nervous system and sense organs: Secondary | ICD-10-CM | POA: Diagnosis not present

## 2021-06-05 DIAGNOSIS — Z96611 Presence of right artificial shoulder joint: Secondary | ICD-10-CM | POA: Diagnosis not present

## 2021-06-10 ENCOUNTER — Telehealth: Payer: Self-pay | Admitting: Neurology

## 2021-06-10 DIAGNOSIS — E559 Vitamin D deficiency, unspecified: Secondary | ICD-10-CM

## 2021-06-10 MED ORDER — VITAMIN D (ERGOCALCIFEROL) 1.25 MG (50000 UNIT) PO CAPS: 50000.0000 [IU] | ORAL_CAPSULE | ORAL | 1 refills | Status: DC

## 2021-06-10 NOTE — Telephone Encounter (Signed)
Pt called stating that she is needing a refill on her Vitamin D, Ergocalciferol, (DRISDOL) 50000 units CAPS capsule sent in to the [facility] on [address]. Also she would like to know why Prednisone 10mg  to take for 14 days with breakfast was ordered in for her. Pt would like to know if she should take it since she did not know this was going to be called in for her. Please advise.

## 2021-06-10 NOTE — Telephone Encounter (Addendum)
Tried calling pt back. Went to VM, VM full. After reviewing her chart. Prednisone was called in for her in error. She does not need to take this, just the gabapentin. I refilled Vit D as she requested. Please relay message if she calls, thank you.

## 2021-06-13 DIAGNOSIS — Z96611 Presence of right artificial shoulder joint: Secondary | ICD-10-CM | POA: Diagnosis not present

## 2021-06-13 DIAGNOSIS — M25611 Stiffness of right shoulder, not elsewhere classified: Secondary | ICD-10-CM | POA: Diagnosis not present

## 2021-06-13 DIAGNOSIS — R296 Repeated falls: Secondary | ICD-10-CM | POA: Diagnosis not present

## 2021-06-13 NOTE — Telephone Encounter (Signed)
Last week these were same symptoms that were described an Dr Jannifer Franklin also increased her requip   (Insert from 6/21 phone note with Dr Jannifer Franklin) "The patient basically has restless leg symptoms throughout the day, she takes 1 mg in the morning, 1 mg around 2 PM, and 2 mg at bedtime.  With this regimen, she still has a lot of symptoms.  We will increase the Requip to 1 in the morning, 2 in the mid afternoon and 2 in the evening of the 1 mg tablets and eventually go to 2 mg 3 times daily."  Pt also received IV iron about 3 weeks ago and her ferritin levels increased from 62-131.    Will still have to discuss these concerns with Dr Brett Fairy

## 2021-06-13 NOTE — Telephone Encounter (Signed)
Called patient who stated she's taking gabapentin  600 mg at bedtime, maybe 300 mg during day, takes cymbalta 120 mg in mornings. She reported no relief at all, feels  pins and needles in legs feet, burning, stated they are red as beets constantly. She stated it began after her shoulder surgery in May. Her next FU is 07/08/21, stated she can't wait that long.  I advised will send message to Dr Brett Fairy and Myriam Jacobson, RN. Patient verbalized understanding, appreciation.

## 2021-06-13 NOTE — Telephone Encounter (Addendum)
Called the patient and advised that at this point Dr. Brett Fairy feels we need to have a office visit to discuss further.  Advised we had a work in [place] 06/20/21 at 11:30 AM.  Patient accepted.

## 2021-06-13 NOTE — Telephone Encounter (Signed)
Pt called, Gabapentin is not working, having extreme pain, cannot sleep. Would like a call from the nurse.

## 2021-06-19 DIAGNOSIS — E538 Deficiency of other specified B group vitamins: Secondary | ICD-10-CM | POA: Diagnosis not present

## 2021-06-19 DIAGNOSIS — R7309 Other abnormal glucose: Secondary | ICD-10-CM | POA: Diagnosis not present

## 2021-06-19 DIAGNOSIS — E559 Vitamin D deficiency, unspecified: Secondary | ICD-10-CM | POA: Diagnosis not present

## 2021-06-19 DIAGNOSIS — I10 Essential (primary) hypertension: Secondary | ICD-10-CM | POA: Diagnosis not present

## 2021-06-19 DIAGNOSIS — E7800 Pure hypercholesterolemia, unspecified: Secondary | ICD-10-CM | POA: Diagnosis not present

## 2021-06-19 DIAGNOSIS — M109 Gout, unspecified: Secondary | ICD-10-CM | POA: Diagnosis not present

## 2021-06-20 ENCOUNTER — Other Ambulatory Visit: Payer: Self-pay

## 2021-06-20 ENCOUNTER — Ambulatory Visit (INDEPENDENT_AMBULATORY_CARE_PROVIDER_SITE_OTHER): Payer: Medicare Other | Admitting: Neurology

## 2021-06-20 ENCOUNTER — Other Ambulatory Visit: Payer: Self-pay | Admitting: Neurology

## 2021-06-20 ENCOUNTER — Encounter: Payer: Self-pay | Admitting: Neurology

## 2021-06-20 ENCOUNTER — Other Ambulatory Visit: Payer: Self-pay | Admitting: Orthopedic Surgery

## 2021-06-20 VITALS — BP 99/55 | HR 75 | Ht 67.0 in | Wt 216.0 lb

## 2021-06-20 DIAGNOSIS — M25511 Pain in right shoulder: Secondary | ICD-10-CM

## 2021-06-20 DIAGNOSIS — I480 Paroxysmal atrial fibrillation: Secondary | ICD-10-CM

## 2021-06-20 DIAGNOSIS — R0602 Shortness of breath: Secondary | ICD-10-CM

## 2021-06-20 DIAGNOSIS — D501 Sideropenic dysphagia: Secondary | ICD-10-CM | POA: Diagnosis not present

## 2021-06-20 DIAGNOSIS — R071 Chest pain on breathing: Secondary | ICD-10-CM | POA: Diagnosis not present

## 2021-06-20 DIAGNOSIS — I639 Cerebral infarction, unspecified: Secondary | ICD-10-CM

## 2021-06-20 DIAGNOSIS — I2729 Other secondary pulmonary hypertension: Secondary | ICD-10-CM

## 2021-06-20 DIAGNOSIS — M7989 Other specified soft tissue disorders: Secondary | ICD-10-CM | POA: Diagnosis not present

## 2021-06-20 DIAGNOSIS — E611 Iron deficiency: Secondary | ICD-10-CM

## 2021-06-20 DIAGNOSIS — E559 Vitamin D deficiency, unspecified: Secondary | ICD-10-CM

## 2021-06-20 DIAGNOSIS — R0989 Other specified symptoms and signs involving the circulatory and respiratory systems: Secondary | ICD-10-CM

## 2021-06-20 NOTE — Progress Notes (Signed)
Iron saturation is normal this time at 25%  ferritin is low at 32 ng/ml. If the patient has RLS symptoms at this time, we can offer iv iron. If the symptoms are not severe, we can repeat test in 3 month and offer iron if needed.    [facility]   Provider:  Larey Seat, M D  Referring Provider: [name], DO Primary Care Physician:    Chief Complaint  Patient presents with   Follow-up    pt alone, here for insurance compliance initial CPAP visit for her new machine.  She states working well no converns. DME [employer]    Rv 06-20-2021:  [name] is a 75 y.o. female Patient  see in a Rv after new sleep study, and after headache treatments with . Ahern,MD.  I have the pleasure of meeting today Ms. [name] who had several phone conversations has reported that since her shoulder surgery she has been miserable.  That she is in pain her legs hurt they cramp they burn and she has the feeling of pins-and-needles in her feet, her feet have become very bluish discolored her veins are protruding it looks like she has quite a bit of edema as well.  and she has lost weight in intermediate fasting, low carb. The patient is s/p bariatric surgery which may restrict the amount she can eat and a meal and also may affect the ability to absorb certain trace elements and vitamins.  Her surgery was on May 12 on her right arm and shoulder and since then she has increased pain in both legs.  First she thought this was just a neuropathy exacerbation or a lot RLS but adjustments to medications have not given her relief.  She is also not severely iron deficient at this time.  She verbalized that she had shortness of breath she had an iron infusion in early June and her labs of the 14th looked relatively normal for her I would not repeat this at this time.  Her current medication regimen is gabapentin 300 mg at 4 PM and 900 mg at bedtime Requip 1 mg in a.m. 2 mg in afternoon 2 mg at bedtime and Cymbalta  60 mg at bedtime she is currently pending further evaluation of lower extremity vasculature by ultrasound-Doppler.  I am looking at a possible iron overload syndrome, we need to check DVT and for PE as well.       She is followed at [facility] since 2006-2007, and is seen here on 07-31-2020- as a revisit for CPAP compliance after retitration:   I ordered a SPLIT study to get her a new machine: The patient underwent a SPLIT polysomnography on May 4, she had still mild obstructive sleep apnea under good resolution and response to CPAP at 8 cmH2O her residual AHI was 0.8 REM sleep rebounded between 8 and 11 cmH2O for which this new machine is set.  The recommendations were an auto titration capable CPAP device with a setting between 6 and 14 cmH2O to centimeter EPR heated humidity and a DreamWear full facemask of small size with nasal cradle.  The patient sleep study was a split-night titration.  She is now here showing me a 93% compliance with an average of 6 hours of nightly use.  Minimum pressure 6 maximum pressure 14 cmH2O EPR is 2 cmH2O the residual AHI is 1.6/h.  This is excellent she has very minor air leakage with her current mask, the 95th percentile pressure is 11.3  cmH2O well in the normal range and except for some weekend days she has always use the machine for the last month and even for the last 3 months.  The average air leak is low enough to not justify any changes in her interface.  Her Epworth sleepiness score is now at 4 points- .  Her migraines have significantly improved with Botox treatments under Dr. Cathren Laine guidance.   Hx: - snoring and restless legs. She has severe OSA , previously was well controlled on CPAP and hypersomnia. She has been diagnosed with dystonia , cervicalgia and ophthalmic migraines.   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  Pacemaker, atrial fib patient. Dr Daneen Schick.   The iron infusion worked well from December to about March  12th- will check ferritin today.  CPAP new machine is coming up, she lost some weight, has gained exercise tolerance.    Epworth sleepiness score was endorsed at 4 , and the fatigue severity questionnaire  at 13 points. She is now working on a balance improvement program.  Will check for labs. Ferritin and TIBC, last value was 13 % TIBC , ferritin 28 4 month ago.  Takes quinine , too.    Patient has had good success with botox treatment for migraine,  Occipital dystonia. last in June 2021.     [name] is [birth year]. #1 She is doing moderately well with her CPAP use was a compliance of 73% average user time on days of use is 4 hours and 44 minutes. Minimum pressure for this AutoSet 7 maximum 15 cm water. The patient's AHI was 2.5 which is a desirable result. The 95th percentile pressure is 12.3. The patient could not use the machine for about 2 weeks after she contracted an upper respiratory infection doing a vacation stay in [place]. She is now back to using it regularly.  #2 her restless leg question here of was endorsed at moderate impairment quality of life.  It seems that her iron levels have rebounded to some degree , but Ferritin results are pending. Her geriatric depression score does not indicate depression her Epworth sleepiness score was endorsed at 8 points and her fatigue severity score 26 points.  #3 headache are a concern. She hates the pain clinic. We are not providing pain management in this clinic but I do feel that the patient could change with the same narcotic agreement and contract to [name]  Her main care provider. She has no history of overusing up using or accidentally overdosing any medications. Her depression scores are in normal range not indicated clinical depression at all. The patient is aware that she could receive with a headache attack Depakote IV. However I am not thrilled with the opportunity of preventing migrainous headaches by Depakote, given the risk of weight  gain, liver failure tremor. She has been on a beta blocker which has helped to some degree. She would be interested in some kind of injection therapy but I explained that Botox injections in the Medicare population on very difficult to get paid for. And Botox is an extremely costly medication these days. The patient has some basic medical training and she would be willing to give herself a shot. We discussed TORADOL  which can be taken either as a 10 mg 2 pill or as an IM medication.  #4 patient will receive 1 mL equaling 2,000,000 g of Toradol intramuscular injection today for her acute headache. If this works well for her  it could be an alternative treatment and hopefully will relieve her of the need to take narcotics to. The patient is a status post gastric bypass surgery so she cannot have nonsteroidal by mouth .  [name] presents today on 10/23/2016. She has recently seen Dr. [name] again who has been successfully treating her head and neck pain. 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 The pain has improved. As to her CPAP use is 87% compliant with an average user time of 6 hours and 9 minutes, she is using an AutoSet between 7 and 15 cm water. Her 95th percentile pressure is 14.4 and straddles close to the maximum pressure allowed. A residual AHI is 7.2 and the residual apneas are obstructive in nature. I do think we should increase the pressure window by 2 cm water. She endorsed today the Epworth sleepiness score at only 4 points.  Her RLS is controlled on 2 nightly doses of medication, each dose giving relief for about 4 hours. She takes quinine for leg cramps. She has  No arryhthmia, has a loop recorder .  Recent mamogram was normal but her cardiologist discovered a mass under the 4 th rib. She is awaiting a CT chest now.    02-02-2018 [facility] CLIN[facility]-[name] is seen here today in a regular revisit, but I have seen her last almost 14 months ago.   Her last visit at [facility] was with Dr. [name] whose note I quote below.  The patient has remained a compliant CPAP user for 25 out of the last 30 days, 5 hours 52 minutes on average use, AutoSet between 5 and 16 cm water pressure with 3 cm EPR residual AHI is 5.7 the vast majority seems to be obstructive in nature yet the 95th percentile pressure is only 14 cmH2O.  We will either have to raise the maximum pressure or reduce the expiratory pressure relief.  She does have high air leaks at times.  She feels extremely fatigued again- but endorsed only 36 points on the fatigue score and 5 points on the Epworth sleepiness score. She wonders if her iron is low and [name] has just yesterday obtained no labs of which are not previous.  She has had another IV iron infusion in 2018 and one 2 weeks ago.  In addition she reports that Dr. Tamala Julian, her cardiologist, would like her control of cholesterol to be much better and recommends the injectable epatha,  which cost her $500 a month and co-pay.  However it is probably the most effective treatment she can get.  All oral  Statin medications have caused myositis and myalgia.  DR Ahern's note. 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 She has significant pain, decreased ROM which is chronic for over one year. She gets regular massage. We have performed multiple trigger point injections with Depo-Medrol, lidocaine and Marcaine. She has tried baclofen, Cymbalta, oxycodone, Lyrica, gabapentin, Robaxin without any relief. Patient would benefit from Physical therapy for stretching, strengthening, manual therapy and massage as well as dry needling of the right trapezius muscle and any other muscles o modalities as clinically warranted by physical therapy evaluation   Procedure note for Spasmodic torticollis:EMG: EMG guidance was used to inject muscles detailed below. Aseptic procedure was performed and patient tolerated procedure. Procedure was performed by  Dr. Myrla Halsted. Patient tolerated the procedure. Refractory to oral medications . Motrin / tylenol for injections site pain / soreness . REMS precautions handout given  to patient  RTC - see instructions for details Wasted 200 Units. Dysport-500 units [id number] x 2 vials. Dysport-500unitsx2vial     Social history:  Retired, married , adult children. Daughter and mother were excessively daytime sleepy. Mother and daughter have RLS.     CPAP : machine issued on 2-08-17-15.  12-9 2020 CPAP compliance has been excellent at 77% the average use at time on days used is 5 hours 26 minutes.  The patient had uses an auto titration between 7 and 16 cmH2O with 3 cm EPR and has a residual AHI of 3.2.  95th percentile pressure is 14 cmH2O all residual apneas seem to be obstructive in nature.  I do think we could increase the maximum pressure by another centimeter or relief EPR to 2 cm to allow her getting enough airway pressure to overcome the obstructive events.  There were 3 minutes or 2% of the nights Cheyne-Stokes respirations noted.  This is not concerning.  She has a very good fit with her interface and very little air leak.     IMPRESSION:  2017 This MRI of the brain with and without contrast shows the following: 1.   Two small wedge-shaped foci in the left hemisphere consistent with prior small ischemic strokes.  As both involve the gray matter and juxtacortical white matter, consider an embolic etiology 2.   Chronic microvascular ischemic change elsewhere, typical extent for age. ( can be  Gastric bypass related )  3.    Cortical atrophy, more than expected for age. 4.   There is a normal enhancement pattern and there are no acute findings.     INTERPRETING PHYSICIAN:  Richard A. Felecia Shelling, MD, PhD Certified in  [place] by [place] [employer]      Review of Systems: Out of a complete 14 system review, the patient complains of only the following symptoms, and all other reviewed systems are  negative.  Restless legs she often wakes up with a headache and she suffers from at times severe crippling migraines. She has dizziness, optic migraine, scotoma in visiual field.  Leg cramps also interfere with her sleep the initiation of sleep as well as the sleep duration at night.  Epworth Sleepiness score 3.   How likely are you to doze in the following situations: 0 = not likely, 1 = slight chance, 2 = moderate chance, 3 = high chance  Sitting and Reading? Watching Television? Sitting inactive in a public place (theater or meeting)? Lying down in the afternoon when circumstances permit? Sitting and talking to someone? Sitting quietly after lunch without alcohol? In a car, while stopped for a few minutes in traffic? As a passenger in a car for an hour without a break?  Total = 3 on 11-23-2019   Fatigue severity score 9 from 24/ 63 - remarkable.  ,  depression score 3.   She lost a lot of weight, she is exercising and she weaned aff all narcotics in 2017.  RLS recurrent. .     Social History   Socioeconomic History   Marital status: Married    Spouse name: Pilar Jarvis   Number of children: 3   Years of education: college   Highest education level: Not on file  Occupational History   Occupation: Retired  Tobacco Use   Smoking status: Never   Smokeless tobacco: Never  Vaping Use   Vaping Use: Never used  Substance and Sexual Activity   Alcohol use: Yes    Alcohol/week: 0.0  standard drinks    Comment: Occasional   Drug use: No   Sexual activity: Yes    Birth control/protection: Post-menopausal  Other Topics Concern   Not on file  Social History Narrative   Lives at home w/ her husband   Caffeine 8-10 cups daily.  (coffee 1 cup am, unsw tea all day long).    Social Determinants of Health   Financial Resource Strain: Not on file  Food Insecurity: Not on file  Transportation Needs: Not on file  Physical Activity: Not on file  Stress: Not on file  Social  Connections: Not on file  Intimate Partner Violence: Not on file    Family History  Problem Relation Age of Onset   CAD Mother    Hypertension Mother    Neuropathy Mother    Osteoarthritis Mother    Heart disease Mother    Breast cancer Maternal Grandmother    CAD Paternal Grandfather    Colon cancer Father     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   Arthritis    generalized   Asthma    Atrophic vaginitis    Back pain    DDD/stenosis   Carotid stenosis    Carotid US 10/16: Plaque RICA (1-88%), normal LICA   Depression    takes Cymbalta daily   Diverticulosis    benign   DJD (degenerative joint disease)    Dyslipidemia    Dysrhythmia    Family history of GI bleeding    GERD (gastroesophageal reflux disease)    takes Nexium daily   Gestational diabetes    Pt denies   H/O hiatal hernia    surgery for hernia   Headache(784.0)    takes Imitrex daily as needed and Bisoprolol daily;last migraine was about 2wks ago   History of bronchitis 1 yr ago   History of shingles    Insomnia    takes Ambien nightly   Joint pain    Joint swelling    Leg cramps    takes Flexeril daily as needed   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     baclofen (LIORESAL) 10 MG tablet TAKE 1 TABLET(10 MG) BY MOUTH TWICE DAILY 60 tablet 0   bisoprolol (ZEBETA) 10 MG tablet Take 10 mg by mouth 2 (two) times daily.     calcium carbonate (OS-CAL) 1250 (500 CA) MG chewable tablet Chew  1 tablet by mouth daily. Not sure of dosage     diclofenac sodium (VOLTAREN) 1 % GEL Apply 4 g topically 4 (four) times daily. 100 g 11   DULoxetine (CYMBALTA) 60 MG capsule Take 60 mg by mouth at bedtime.  11   Erenumab-aooe (AIMOVIG) 140 MG/ML SOAJ Inject 140 mg into the skin every 30 (thirty) days. 1 mL 1   esomeprazole (NEXIUM) 40 MG  capsule Take 40 mg by mouth 2 (two) times daily before a meal.   6   Evolocumab (REPATHA SURECLICK) 272 MG/ML SOAJ Inject 1 pen into the skin every 14 (fourteen) days. 2 mL 11   furosemide (LASIX) 20 MG tablet Take 40 mg by mouth daily as needed for fluid or edema.     gabapentin (NEURONTIN) 300 MG capsule Take 1 capsule (300 mg total) by mouth 4 (four) times daily. (Patient taking differently: Take 300-900 mg by mouth. 300 mg at 4 pm and 900 mg at bedtime) 120 capsule 5   HYDROcodone-acetaminophen (NORCO) 10-325 MG tablet Take 1-2 tablets by mouth every 4 (four) hours as needed for moderate pain or severe pain. Takes for migraines 30 tablet 0   Loratadine 10 MG CAPS Take 1 capsule by mouth as needed (for allergies).      nystatin (MYCOSTATIN) 100000 UNIT/ML suspension 5 ml     ondansetron (ZOFRAN-ODT) 4 MG disintegrating tablet DISSOLVE 1 TABLET(4 MG) ON THE TONGUE EVERY 8 HOURS AS NEEDED FOR NAUSEA OR VOMITING 45 tablet 2   PROAIR HFA 108 (90 Base) MCG/ACT inhaler 2 INHALATIONS EVERY 4 HOURS AS NEEDED FOR ASTHMA SYMPTOMS (WHEEZING/SHORTNESS OF BREATH)  5   quiNINE (QUALAQUIN) 324 MG capsule TAKE ONE CAPSULE BY MOUTH TWICE A DAY 60 capsule 5   rOPINIRole (REQUIP) 1 MG tablet 1 tablet in the morning, 2 tablets in mid afternoon and in the evening 450 tablet 3   tiZANidine (ZANAFLEX) 4 MG tablet Take 1 tablet (4 mg total) by mouth every 8 (eight) hours as needed for muscle spasms. 30 tablet 1   Vitamin D, Ergocalciferol, (DRISDOL) 1.25 MG (50000 UNIT) CAPS capsule Take 1 capsule (50,000 Units total) by mouth every 7 (seven) days. 13 capsule 1   Current  Facility-Administered Medications  Medication Dose Route Frequency Provider Last Rate Last Admin   Erenumab-aooe SOAJ 140 mg  140 mg Subcutaneous Once Melvenia Beam, MD        Allergies as of 06/20/2021 - Review Complete 06/20/2021  Allergen Reaction Noted   Atorvastatin  03/12/2021   Ezetimibe  03/12/2021    Vitals: BP (!) 99/55   Pulse 75   Ht 5\' 7"  (1.702 m)   Wt 216 lb (98 kg)   BMI 33.83 kg/m  Last Weight:  Wt Readings from Last 1 Encounters:  06/20/21 216 lb (98 kg)       Last Height:   Ht Readings from Last 1 Encounters:  06/20/21 5\' 7"  (1.702 m)    Physical exam:  General: The patient is awake, alert and appears not in acute distress. The patient is well groomed. Head: Normocephalic, atraumatic. Neck is now supple. No tenderness. Mallampati 3 ,  neck circumference: 15 inches. Nasal airflow unrestricted, TMJ is not evident. Retrognathia is seen.  Cardiovascular:  Regular rate and rhythm, without  murmurs or carotid bruit, and without distended neck veins. Respiratory: Lungs are clear to auscultation. Skin: Significant swelling and edema over both lower extremities, discoloration at the lower half of the lower extremities, feet are bluish but hot to touch skin is swollen, there is no positive Bevelyn Buckles' sign but yet the patient describes pain with movement and at rest.  Trunk: BMI is elevated .  Neurologic exam :The patient is awake and alert, oriented to place and time.  Memory subjective described as intact.  There is a normal attention span & concentration ability. Speech is fluent without  dysarthria, dysphonia or aphasia. Mood and affect are  appropriate. Cranial nerves: Pupils are equal and briskly reactive to light.  Extraocular movements  in vertical and horizontal planes intact ,without nystagmus.  Visual fields by finger perimetry are intact. Hearing to finger rub intact. Facial motor strength is symmetric and tongue and uvula move midline.  Shoulder shrug  intact . Motor exam:  Normal tone, no cog-wheeling, muscle bulk and symmetric strength in all extremities. No cog wheeling and no waxy or clasp knife response.  Sensory:  Fine touch, pinprick and vibration were tested in all extremities. Proprioception in both feet is affeced by pin and needle dysesthesias.  Coordination: Rapid alternating movements in the fingers/hands is normal. Finger-to-nose maneuver normal without evidence of ataxia, dysmetria or tremor. Gait and station: Patient walks without assistive device .  Strength within normal limits. Stance is stable and normal.  Deep tendon reflexes: in the  upper and lower extremities are symmetric. Babinski maneuver deferred.   Assessment:  After physical and neurologic examination, review of laboratory studies, imaging, neurophysiology testing and pre-existing records, assessment is   The patient was advised of the nature of the diagnosed sleep disorder , the treatment options and risks for general a health and wellness arising from not treating the condition. Visit duration was 40 minutes.  Of today's new patient visit-consultation 50% of our face-to-face time are spent in discussion of the medical conditions found the differential diagnosis the testing and treatment options for the conditions. The patient is willing to undergo a new iron infusion cycle .  Her cardiologists note's support this Pernell Dupre ,MD ) , Dr. Waymon Budge.   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  She is chronically iron deficient, she has been vitamin D deficient she has been vitamin B12 deficient. I have prescribed her a vitamin B12 nasal spray, vitamin D 50,000 units weekly pill, and I will obtain iron studies today to see which dose of an iron infusion would be indicated for her.  This the recent discovery of 2 lacunar strokes or actually embolic strokes she underwent echocardiogram, and the PFO was  discovered. I would like for her to meet with a cardiologist to discuss the relevance to her MRI findings as well as to her clinical symptoms of headaches. The pain in the neck and headaches have both improved and I hope that she can continue Botox therapy through Dr. Elnita Maxwell as well as trigger point injections. Nerve blocks for neuralgia are also ordered. A download of her machine was not yet obtained but I reviewed her restless leg symptomatic and her RLS rating scale was endorsed at very low impediment there is no evidence at this time of major quality of life impairment based on restless leg intensity. She is not depressed. Is in the process of losing weight and she appears optimistic and motivated. I'm especially proud that she weaned herself off all narcotics.  Plan:  Treatment plan and additional workup :  She has described shortness of breath and all of this set on about 14 days after shoulder surgery.  I like to get DVT evaluation by lower extremity Doppler as soon as possible I also will order a chest x-ray to make sure there is no PE.  1 RLS with iron deficiency ) Dr. Beryle Beams  evaluated her discrepancy between very low ferritin levels  and apparently sufficient iron storage. She has maintained oral iron intake as well as multivitamins and multi minerals. Repeat ferritin levels, order iron  IV. 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 She is not diabetic she has no documented thyroid disease and neither did her mother, nor has her daughter -but all of these women half severe painful dysesthesias, leg cramps, and restless legs.  She took quinine with good success, but had to order from [place].  She has now lower extremity edema, discoloration, the feet are puffy, shiny and painful to touch.  Gout ?   3) not addressed today - very low Blood pressure- with shortness of  breath, lightheadedness.    Rv in 2 month with me.   I thank Dr [name] for her headache expertise and treatment.  The patient has been relieved of neck pain and occipital neuralgic pain and pain with radiculopathic distribution to the shoulders.    Asencion Partridge Brenton Joines MD  06/20/2021

## 2021-06-20 NOTE — Patient Instructions (Addendum)
V Q scan order - and  transferrin level ordered.

## 2021-06-22 LAB — TRANSFERRIN SATURATION
IRON SATN MFR SERPL: 22 % Saturation
IRON SERPL-MCNC: 73 ug/dL
TRANSFERRIN SERPL-MCNC: 234 mg/dL

## 2021-06-23 NOTE — Progress Notes (Signed)
Low normal iron availability, no sign of overload of iron. Her leg pain is not typical for RLS and has been associated with leg swelling discoloration and shortness of breath, all after a surgery. Consider vascular abnormality. Please defer to PCP for further work up.

## 2021-06-24 ENCOUNTER — Telehealth: Payer: Self-pay | Admitting: Neurology

## 2021-06-24 DIAGNOSIS — R0602 Shortness of breath: Secondary | ICD-10-CM

## 2021-06-24 DIAGNOSIS — R0989 Other specified symptoms and signs involving the circulatory and respiratory systems: Secondary | ICD-10-CM

## 2021-06-24 DIAGNOSIS — I2729 Other secondary pulmonary hypertension: Secondary | ICD-10-CM

## 2021-06-24 NOTE — Telephone Encounter (Signed)
-----   Message from Tasia Catchings sent at 06/21/2021  9:41 AM EDT ----- Regarding: Please add chest xray    Dr. Brett Fairy,  Could you please add a 2 view chest xray for the perfusion test?     Thank you, [facility]

## 2021-06-24 NOTE — Telephone Encounter (Signed)
I went and ordered the chest X ray as requested by Radiology- can we make sure it went to the right place ( [facility]?. CD

## 2021-06-26 ENCOUNTER — Other Ambulatory Visit: Payer: Self-pay

## 2021-06-26 ENCOUNTER — Ambulatory Visit (HOSPITAL_BASED_OUTPATIENT_CLINIC_OR_DEPARTMENT_OTHER)
Admission: RE | Admit: 2021-06-26 | Discharge: 2021-06-26 | Disposition: A | Discharge status: Home / Self Care | Payer: Medicare Other | Source: Ambulatory Visit | Attending: Orthopedic Surgery | Admitting: Orthopedic Surgery

## 2021-06-26 DIAGNOSIS — M25511 Pain in right shoulder: Secondary | ICD-10-CM | POA: Insufficient documentation

## 2021-06-26 DIAGNOSIS — Z96611 Presence of right artificial shoulder joint: Secondary | ICD-10-CM | POA: Diagnosis not present

## 2021-06-26 DIAGNOSIS — M19011 Primary osteoarthritis, right shoulder: Secondary | ICD-10-CM | POA: Diagnosis not present

## 2021-06-26 IMAGING — CT CT SHOULDER*R* W/O CM
3 of 4 series · 13 of 33 positions shown, 16 images · non-contrast
Comparison: Plain films right shoulder [DATE] and [DATE].
MRI right shoulder [DATE].

CLINICAL DATA: Patient status post right shoulder replacement
[DATE]. The patient suffered a fall [DATE]. Intermittent
right shoulder pain.

EXAM:
CT OF THE UPPER RIGHT EXTREMITY WITHOUT CONTRAST
TECHNIQUE: Multidetector CT imaging of the upper right extremity was performed
according to the standard protocol.

[Series 3: thin soft (person_name) · axial · 0.47mm/px · z∈[-804,-646]mm · 5 of 305 slices shown, 7 images]
[im 31/305  soft-tissue]
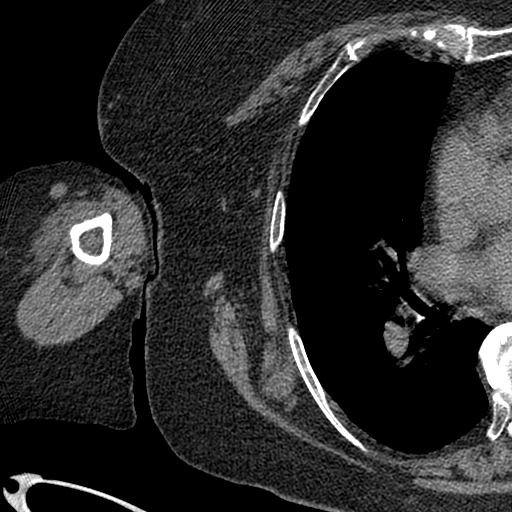
[im 31/305  bone]
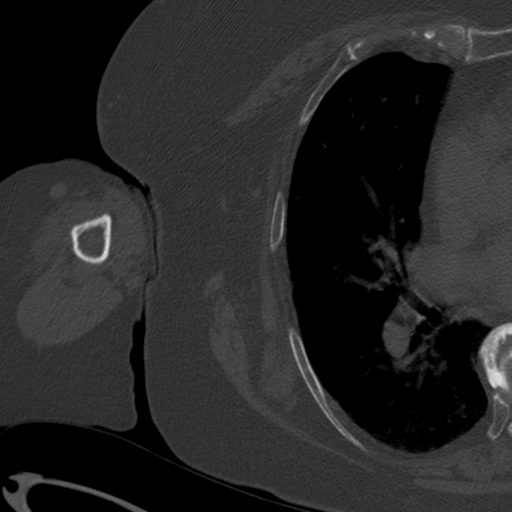
[im 92/305  bone]
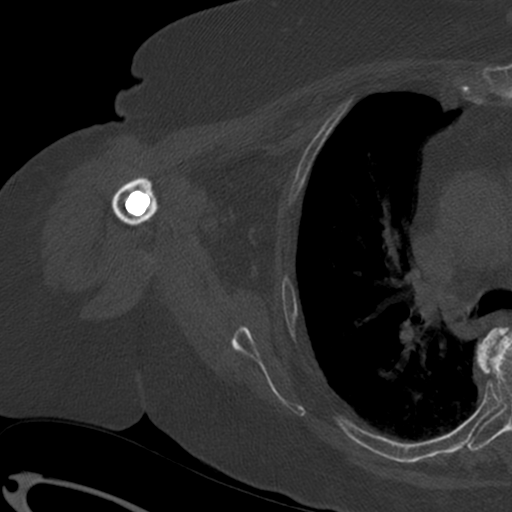
[im 153/305  bone]
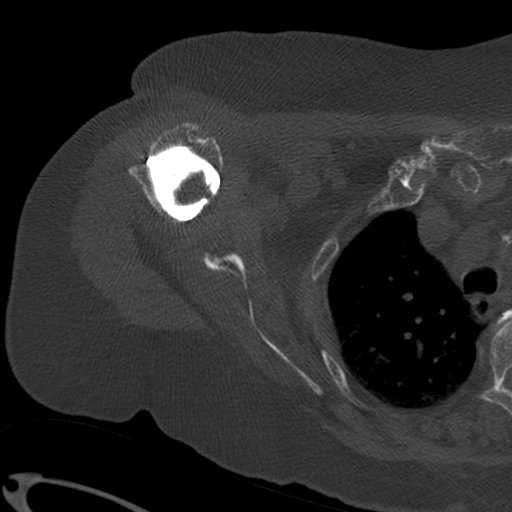
[im 213/305  bone]
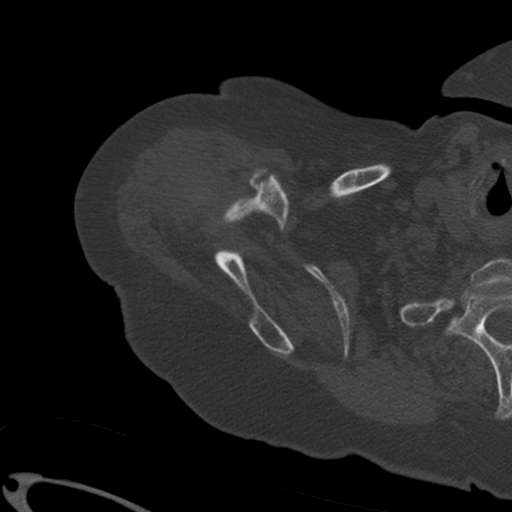
[im 274/305  soft-tissue]
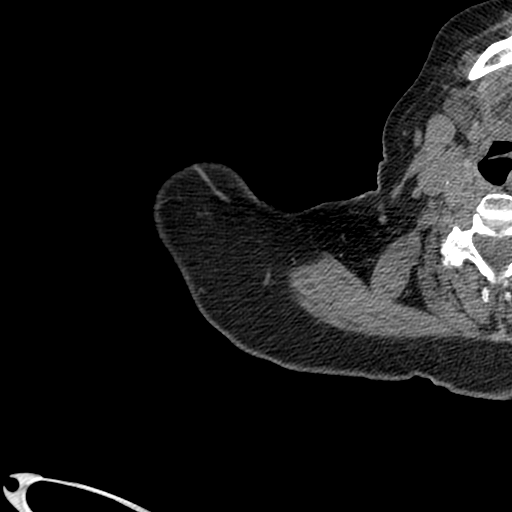
[im 274/305  bone]
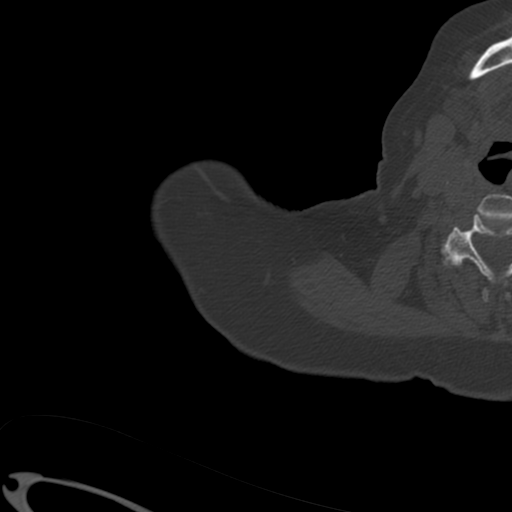

[Series 8: coronal soft · coronal · 0.35mm/px · 3 of 121 slices shown]
[im 38/121  bone]
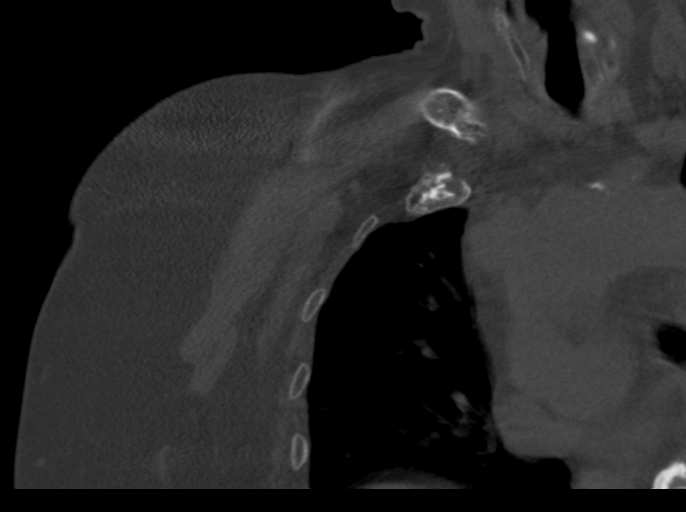
[im 53/121  bone]
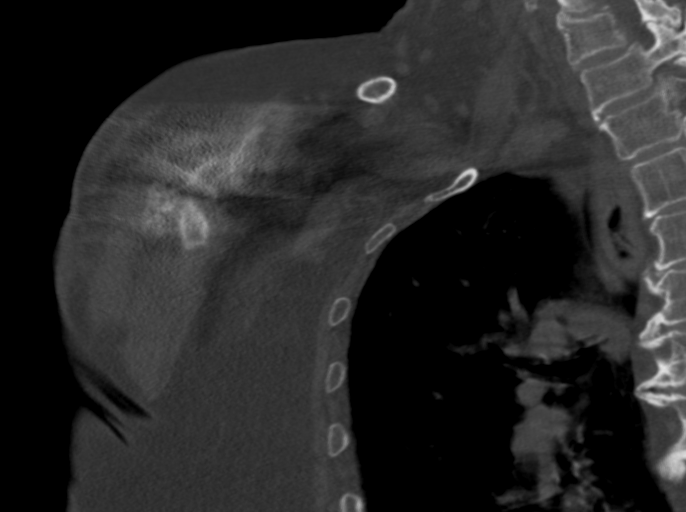
[im 68/121  bone]
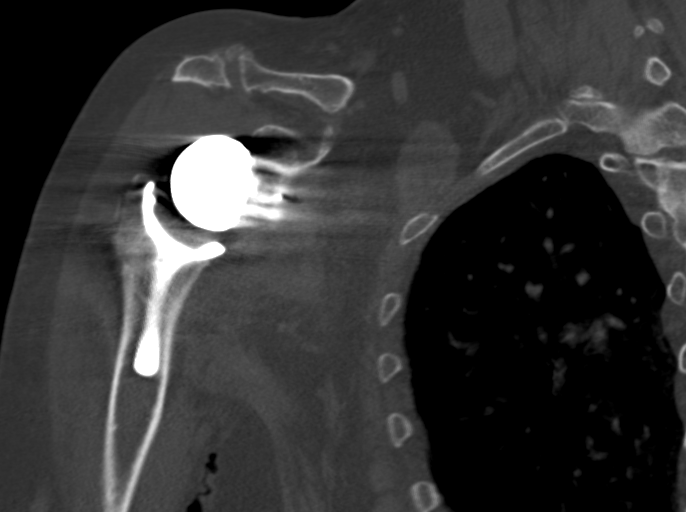

[Series 9: sagittal soft · sagittal · 0.35mm/px · 5 of 121 slices shown, 6 images]
[im 41/121  bone]
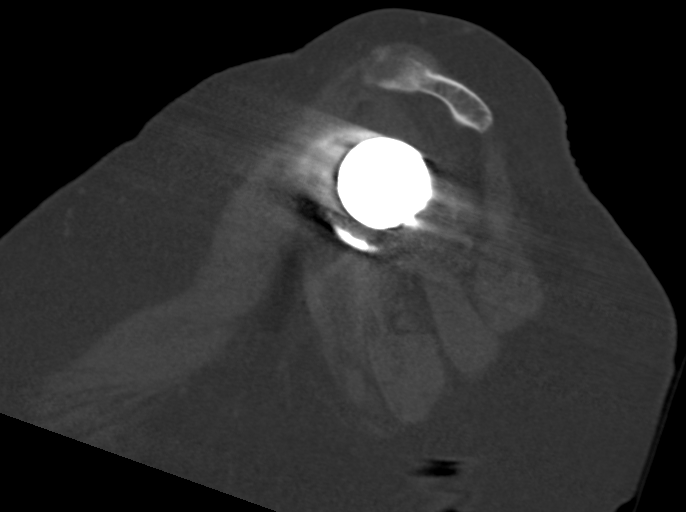
[im 51/121  bone]
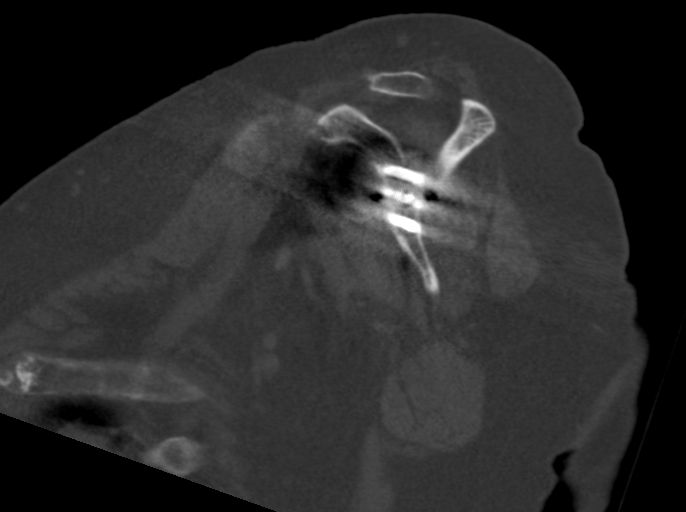
[im 61/121  soft-tissue]
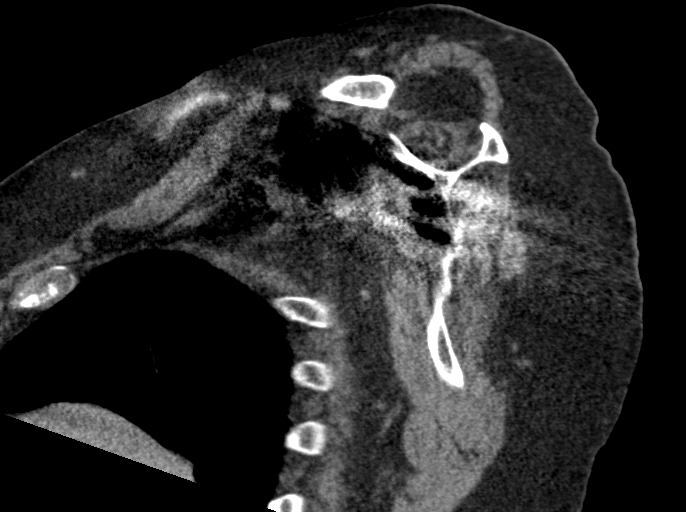
[im 61/121  bone]
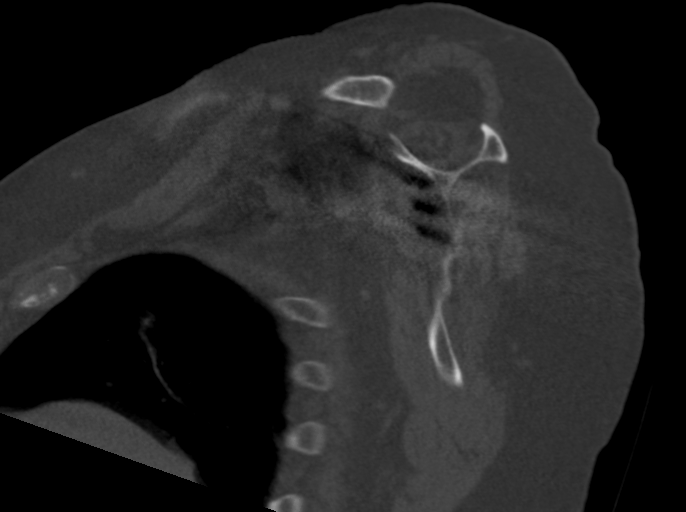
[im 71/121  bone]
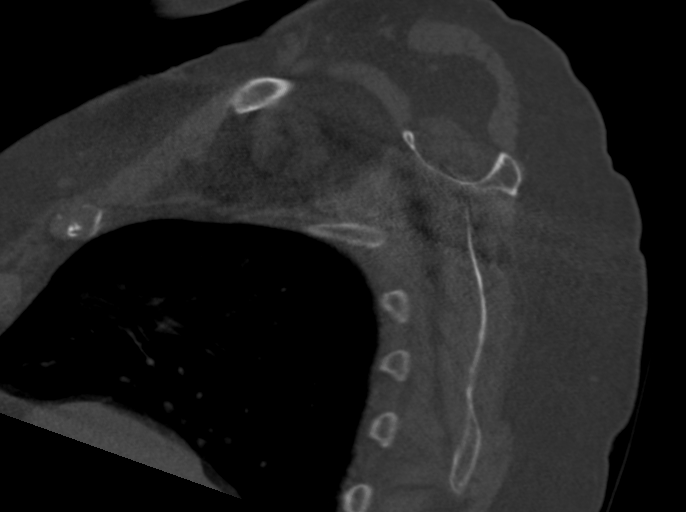
[im 81/121  bone]
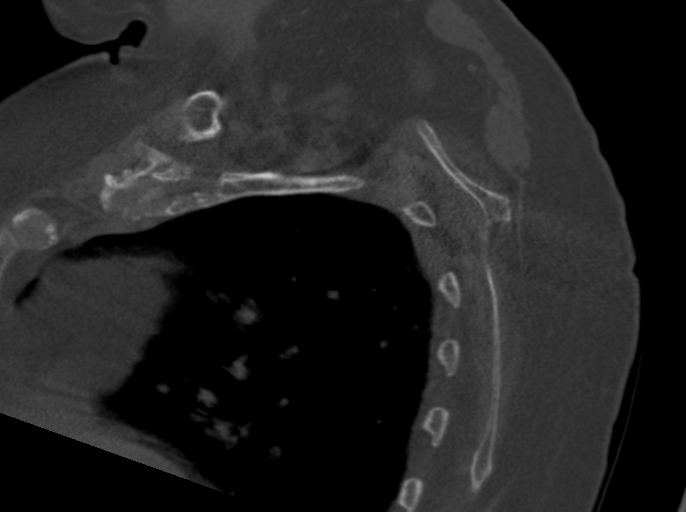

[13 of 33 positions shown; findings below may reference images not displayed]

FINDINGS: Bones/Joint/Cartilage

Reverse shoulder arthroplasty is in place. The device is located. No
evidence of hardware loosening or other complicating feature is
identified. Well corticated bone fragments about the superior aspect
of the humeral component of the patient's arthroplasty correlate
with fractures seen on the prior exams. No acute fracture is seen.
No lytic or sclerotic lesion is identified.

Ligaments

Suboptimally assessed by CT.

Muscles and Tendons

Visualization is limited by streak artifact. There is mild fatty
atrophy of rotator muscles.

Soft tissues

Imaged lung parenchyma demonstrates mild atelectasis or scar in the
right middle lobe
IMPRESSION: Status post right shoulder replacement. No hardware complication or
acute abnormality is identified. No finding to explain the patient's
symptoms. Bone fragments about the proximal humerus correlate with
the patient's remote fracture.

Moderate acromioclavicular osteoarthritis.

## 2021-06-28 ENCOUNTER — Other Ambulatory Visit: Payer: Self-pay

## 2021-06-28 ENCOUNTER — Ambulatory Visit (HOSPITAL_COMMUNITY)
Admission: RE | Admit: 2021-06-28 | Discharge: 2021-06-28 | Disposition: A | Discharge status: Home / Self Care | Payer: Medicare Other | Source: Ambulatory Visit | Attending: Neurology | Admitting: Neurology

## 2021-06-28 DIAGNOSIS — M7989 Other specified soft tissue disorders: Secondary | ICD-10-CM | POA: Insufficient documentation

## 2021-06-28 DIAGNOSIS — R0602 Shortness of breath: Secondary | ICD-10-CM | POA: Diagnosis not present

## 2021-06-28 DIAGNOSIS — I480 Paroxysmal atrial fibrillation: Secondary | ICD-10-CM | POA: Diagnosis not present

## 2021-06-28 DIAGNOSIS — D501 Sideropenic dysphagia: Secondary | ICD-10-CM | POA: Insufficient documentation

## 2021-06-28 NOTE — Progress Notes (Signed)
Bilateral lower extremity venous duplex completed. Refer to "CV Proc" under chart review to view preliminary results.  06/28/2021 10:35 AM Kelby Aline., MHA, RVT, RDCS, RDMS

## 2021-07-01 ENCOUNTER — Encounter: Payer: Self-pay | Admitting: Neurology

## 2021-07-01 DIAGNOSIS — G43909 Migraine, unspecified, not intractable, without status migrainosus: Secondary | ICD-10-CM | POA: Diagnosis not present

## 2021-07-01 DIAGNOSIS — J3089 Other allergic rhinitis: Secondary | ICD-10-CM | POA: Diagnosis not present

## 2021-07-01 DIAGNOSIS — D509 Iron deficiency anemia, unspecified: Secondary | ICD-10-CM | POA: Diagnosis not present

## 2021-07-01 DIAGNOSIS — G629 Polyneuropathy, unspecified: Secondary | ICD-10-CM | POA: Diagnosis not present

## 2021-07-01 DIAGNOSIS — I872 Venous insufficiency (chronic) (peripheral): Secondary | ICD-10-CM | POA: Diagnosis not present

## 2021-07-01 DIAGNOSIS — J45909 Unspecified asthma, uncomplicated: Secondary | ICD-10-CM | POA: Diagnosis not present

## 2021-07-01 DIAGNOSIS — G2581 Restless legs syndrome: Secondary | ICD-10-CM | POA: Diagnosis not present

## 2021-07-01 DIAGNOSIS — E790 Hyperuricemia without signs of inflammatory arthritis and tophaceous disease: Secondary | ICD-10-CM | POA: Diagnosis not present

## 2021-07-01 DIAGNOSIS — Z0000 Encounter for general adult medical examination without abnormal findings: Secondary | ICD-10-CM | POA: Diagnosis not present

## 2021-07-01 DIAGNOSIS — E7800 Pure hypercholesterolemia, unspecified: Secondary | ICD-10-CM | POA: Diagnosis not present

## 2021-07-01 DIAGNOSIS — I10 Essential (primary) hypertension: Secondary | ICD-10-CM | POA: Diagnosis not present

## 2021-07-01 NOTE — Progress Notes (Signed)
No thrombosis- negative for leg DVT.

## 2021-07-03 DIAGNOSIS — R296 Repeated falls: Secondary | ICD-10-CM | POA: Diagnosis not present

## 2021-07-03 DIAGNOSIS — Z96611 Presence of right artificial shoulder joint: Secondary | ICD-10-CM | POA: Diagnosis not present

## 2021-07-03 DIAGNOSIS — M25611 Stiffness of right shoulder, not elsewhere classified: Secondary | ICD-10-CM | POA: Diagnosis not present

## 2021-07-03 NOTE — Telephone Encounter (Signed)
Summary:  RIGHT:  - There is no evidence of deep vein thrombosis in the lower extremity.     - No cystic structure found in the popliteal fossa.     LEFT:  - There is no evidence of deep vein thrombosis in the lower extremity.     - No cystic structure found in the popliteal fossa.      *See table(s) above for measurements and observations.   Electronically signed by Monica Martinez MD on 06/28/2021 at 2:45:48 PM.    CC : [name], DO ( PCP).    Chest X ray results:

## 2021-07-08 ENCOUNTER — Ambulatory Visit (INDEPENDENT_AMBULATORY_CARE_PROVIDER_SITE_OTHER): Payer: Medicare Other | Admitting: Neurology

## 2021-07-08 ENCOUNTER — Encounter: Payer: Self-pay | Admitting: Neurology

## 2021-07-08 VITALS — BP 119/67 | HR 87 | Ht 67.0 in | Wt 216.0 lb

## 2021-07-08 DIAGNOSIS — D501 Sideropenic dysphagia: Secondary | ICD-10-CM | POA: Diagnosis not present

## 2021-07-08 DIAGNOSIS — I480 Paroxysmal atrial fibrillation: Secondary | ICD-10-CM

## 2021-07-08 DIAGNOSIS — T50905D Adverse effect of unspecified drugs, medicaments and biological substances, subsequent encounter: Secondary | ICD-10-CM

## 2021-07-08 DIAGNOSIS — Z9989 Dependence on other enabling machines and devices: Secondary | ICD-10-CM | POA: Diagnosis not present

## 2021-07-08 DIAGNOSIS — I639 Cerebral infarction, unspecified: Secondary | ICD-10-CM | POA: Diagnosis not present

## 2021-07-08 DIAGNOSIS — G4733 Obstructive sleep apnea (adult) (pediatric): Secondary | ICD-10-CM

## 2021-07-08 DIAGNOSIS — R0602 Shortness of breath: Secondary | ICD-10-CM | POA: Diagnosis not present

## 2021-07-08 NOTE — Addendum Note (Signed)
Addended by: Larey Seat on: 07/08/2021 02:05 PM   Modules accepted: Orders

## 2021-07-08 NOTE — Progress Notes (Addendum)
[facility]   Provider:  Larey Seat, M D  Referring Provider: [name], DO Primary Care Physician:    Chief Complaint  Patient presents with   Follow-up    pt alone, here for insurance compliance initial CPAP visit for her new machine.  She states working well no converns. DME [employer]    Rv 07-08-2021:  Rafaela Dinius Thorman is a 75 y.o. female Patient  see in a Rv after new sleep study, and after headache treatments with [name], MD.  Since I last saw Mrs. Kishimoto less than 3 weeks ago her swelling in her feet has much improved and her pain has improved.  She has still some burning and aching but her chiropractor has recommended some naturopathic nutritional supplements which seem to be vitamin B D in the [place] related.  She also takes folic acid.  Based on this and her improvement I think she is fine to go with that we could rule out more severe possible causes of her leg pain and swelling she did not have a deep venous thrombosis or pulmonary embolism.  She has been able to reduce the swelling and stay on the current medication she was also put on amitriptyline for the night and since amitriptyline has a similar potential to prolong the QT time and her EKG as quinine has I have asked her not to take the quinine by taking amitriptyline. So basically there is nothing else to report from her legs pain for now and her CPAP compliance has been excellent she is using an AutoSet between 6 and 14 cmH2O pressure was 2 cm EPR her residual AHI is 2.3/h which speaks for an excellent resolution air leak is minimal 95th percentile pressure is 12.6 cmH2O and well within the current settings, her average user time is 6 hours and 18 minutes per night and she has a 97% compliance for days and a 90% compliance for over 4 hours nightly.  No changes need to be made also I would like to offer her a maximum pressure of 15 or 16 cmH2O so that there is some room to play. She has joined [employer] and is a  little disappointed in the chlorine pool, but she will find help with weight and mobility.     Rv 06-20-2021: I have the pleasure of meeting today Ms. Arbie Cookey Riordan who had several phone conversations has reported that since her shoulder surgery she has been miserable.  That she is in pain her legs hurt they cramp they burn and she has the feeling of pins-and-needles in her feet, her feet have become very bluish discolored her veins are protruding it looks like she has quite a bit of edema as well.  and she has lost weight in intermediate fasting, low carb. The patient is s/p bariatric surgery which may restrict the amount she can eat and a meal and also may affect the ability to absorb certain trace elements and vitamins.  Her surgery was on May 12 on her right arm and shoulder and since then she has increased pain in both legs.  First she thought this was just a neuropathy exacerbation or a lot RLS but adjustments to medications have not given her relief.  She is also not severely iron deficient at this time.  She verbalized that she had shortness of breath she had an iron infusion in early June and her labs of the 14th looked relatively normal for her I would not repeat this at  this time.  Her current medication regimen is gabapentin 300 mg at 4 PM and 900 mg at bedtime Requip 1 mg in a.m. 2 mg in afternoon 2 mg at bedtime and Cymbalta 60 mg at bedtime she is currently pending further evaluation of lower extremity vasculature by ultrasound-Doppler.  I am looking at a possible iron overload syndrome, we need to check DVT and for PE as well.  Iron saturation is normal this time at 25%  ferritin is low at 32 ng/ml. If the patient has RLS symptoms at this time, we can offer iv iron. If the symptoms are not severe, we can repeat test in 3 month and offer iron if needed.      She is followed at [facility] 2006[facility], and is seen here on 07-31-2020- as a revisit for CPAP compliance after retitration:   I ordered  a SPLIT study to get her a new machine: The patient underwent a SPLIT polysomnography on May 4, she had still mild obstructive sleep apnea under good resolution and response to CPAP at 8 cmH2O her residual AHI was 0.8 REM sleep rebounded between 8 and 11 cmH2O for which this new machine is set.  The recommendations were an auto titration capable CPAP device with a setting between 6 and 14 cmH2O to centimeter EPR heated humidity and a [employer]Wear full facemask of small size with nasal cradle.  The patient sleep study was a split-night titration.  She is now here showing me a 93% compliance with an average of 6 hours of nightly use.  Minimum pressure 6 maximum pressure 14 cmH2O EPR is 2 cmH2O the residual AHI is 1.6/h.  This is excellent she has very minor air leakage with her current mask, the 95th percentile pressure is 11.3 cmH2O well in the normal range and except for some weekend days she has always use the machine for the last month and even for the last 3 months.  The average air leak is low enough to not justify any changes in her interface.  Her Epworth sleepiness score is now at 4 points- .  Her migraines have significantly improved with Botox treatments under Dr. Cathren Laine guidance.   Hx: - snoring and restless legs. She has severe OSA , previously was well controlled on CPAP and hypersomnia. She has been diagnosed with dystonia , cervicalgia and ophthalmic migraines.   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  Pacemaker, atrial fib patient. Dr Daneen Schick.   The iron infusion worked well from December to about March 12th- will check ferritin today.  CPAP new machine is coming up, she lost some weight, has gained exercise tolerance.    Epworth sleepiness score was endorsed at 4 , and the fatigue severity questionnaire  at 13 points. She is now working on a balance improvement program.  Will check for labs. Ferritin and TIBC, last value was 13 % TIBC , ferritin 28 4  month ago.  Takes quinine , too.    Patient has had good success with botox treatment for migraine,  Occipital dystonia. last in June 2021.     [name] is [birth year]. #1 She is doing moderately well with her CPAP use was a compliance of 73% average user time on days of use is 4 hours and 44 minutes. Minimum pressure for this AutoSet 7 maximum 15 cm water. The patient's AHI was 2.5 which is a desirable result. The 95th percentile pressure is 12.3. The patient could not use the  machine for about 2 weeks after she contracted an upper respiratory infection doing a vacation stay in [place]. She is now back to using it regularly.  #2 her restless leg question here of was endorsed at moderate impairment quality of life.  It seems that her iron levels have rebounded to some degree , but Ferritin results are pending. Her geriatric depression score does not indicate depression her Epworth sleepiness score was endorsed at 8 points and her fatigue severity score 26 points.  #3 headache are a concern. She hates the pain clinic. We are not providing pain management in this clinic but I do feel that the patient could change with the same narcotic agreement and contract to [name]  Her main care provider. She has no history of overusing up using or accidentally overdosing any medications. Her depression scores are in normal range not indicated clinical depression at all. The patient is aware that she could receive with a headache attack Depakote IV. However I am not thrilled with the opportunity of preventing migrainous headaches by Depakote, given the risk of weight gain, liver failure tremor. She has been on a beta blocker which has helped to some degree. She would be interested in some kind of injection therapy but I explained that Botox injections in the Medicare population on very difficult to get paid for. And Botox is an extremely costly medication these days. The patient has some basic medical training and she  would be willing to give herself a shot. We discussed TORADOL  which can be taken either as a 10 mg 2 pill or as an IM medication.  #4 patient will receive 1 mL equaling 2,000,000 g of Toradol intramuscular injection today for her acute headache. If this works well for her it could be an alternative treatment and hopefully will relieve her of the need to take narcotics to. The patient is a status post gastric bypass surgery so she cannot have nonsteroidal by mouth .  Mrs. Alumbaugh presents today on 10/23/2016. She has recently seen Dr. [name] again who has been successfully treating her head and neck pain. 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 The pain has improved. As to her CPAP use is 87% compliant with an average user time of 6 hours and 9 minutes, she is using an AutoSet between 7 and 15 cm water. Her 95th percentile pressure is 14.4 and straddles close to the maximum pressure allowed. A residual AHI is 7.2 and the residual apneas are obstructive in nature. I do think we should increase the pressure window by 2 cm water. She endorsed today the Epworth sleepiness score at only 4 points.  Her RLS is controlled on 2 nightly doses of medication, each dose giving relief for about 4 hours. She takes quinine for leg cramps. She has  No arryhthmia, has a loop recorder .  Recent mamogram was normal but her cardiologist discovered a mass under the 4 th rib. She is awaiting a CT chest now.    02-02-[facility]-Mrs. Wolfman is seen here today in a regular revisit, but I have seen her last almost 14 months ago.  Her last visit at [facility] was with Dr. [name] whose note I quote below.  The patient has remained a compliant CPAP user for 25 out of the last 30 days, 5 hours 52 minutes on average use, AutoSet between 5 and 16 cm water pressure with 3 cm EPR residual AHI is 5.7 the vast majority  seems to be obstructive in nature yet the 95th percentile pressure is only 14  cmH2O.  We will either have to raise the maximum pressure or reduce the expiratory pressure relief.  She does have high air leaks at times.  She feels extremely fatigued again- but endorsed only 36 points on the fatigue score and 5 points on the Epworth sleepiness score. She wonders if her iron is low and [name] has just yesterday obtained no labs of which are not previous.  She has had another IV iron infusion in 2018 and one 2 weeks ago.  In addition she reports that Dr. Tamala Julian, her cardiologist, would like her control of cholesterol to be much better and recommends the injectable epatha,  which cost her $500 a month and co-pay.  However it is probably the most effective treatment she can get.  All oral  Statin medications have caused myositis and myalgia.  DR Ahern's note. 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 She has significant pain, decreased ROM which is chronic for over one year. She gets regular massage. We have performed multiple trigger point injections with Depo-Medrol, lidocaine and Marcaine. She has tried baclofen, Cymbalta, oxycodone, Lyrica, gabapentin, Robaxin without any relief. Patient would benefit from Physical therapy for stretching, strengthening, manual therapy and massage as well as dry needling of the right trapezius muscle and any other muscles o modalities as clinically warranted by physical therapy evaluation   Procedure note for Spasmodic torticollis:EMG: EMG guidance was used to inject muscles detailed below. Aseptic procedure was performed and patient tolerated procedure. Procedure was performed by Dr. Myrla Halsted. Patient tolerated the procedure. Refractory to oral medications . Motrin / tylenol for injections site pain / soreness . REMS precautions handout given to patient  RTC - see instructions for details Wasted 200 Units. Dysport-500 units [id number] x 2 vials. Dysport-500unitsx2vial     Social history:  Retired, married , adult children.  Daughter and mother were excessively daytime sleepy. Mother and daughter have RLS.     CPAP : the previous machine had been issued on 2-08-17-15.  12-9 2020 CPAP compliance has been excellent at 77% the average use at time on days used is 5 hours 26 minutes.  The patient had uses an auto titration between 7 and 16 cmH2O with 3 cm EPR and has a residual AHI of 3.2.  95th percentile pressure is 14 cmH2O all residual apneas seem to be obstructive in nature.  I do think we could increase the maximum pressure by another centimeter or relief EPR to 2 cm to allow her getting enough airway pressure to overcome the obstructive events.  There were 3 minutes or 2% of the nights Cheyne-Stokes respirations noted.  This is not concerning.  She has a very good fit with her interface and very little air leak.        Review of Systems: Out of a complete 14 system review, the patient complains of only the following symptoms, and all other reviewed systems are negative.  Restless legs she often wakes up with a headache and she suffers from at times severe crippling migraines. She has dizziness, optic migraine, scotoma in visiual field.  Leg cramps also interfere with her sleep the initiation of sleep as well as the sleep duration at night.  Epworth Sleepiness score 3.   How likely are you to doze in the following situations: 0 = not likely, 1 = slight chance, 2 = moderate chance, 3 = high chance  Sitting and Reading? Watching Television? Sitting inactive in a public place (theater or meeting)? Lying down in the afternoon when circumstances permit? Sitting and talking to someone? Sitting quietly after lunch without alcohol? In a car, while stopped for a few minutes in traffic? As a passenger in a car for an hour without a break?  Total = 3 on 11-23-2019   Fatigue severity score 9 from 24/ 63 - remarkable.  ,  depression score 3.   She lost a lot of weight, she is exercising and she weaned aff all narcotics  in 2017.  RLS recurrent. .     Social History   Socioeconomic History   Marital status: Married    Spouse name: Pilar Jarvis   Number of children: 3   Years of education: college   Highest education level: Not on file  Occupational History   Occupation: Retired  Tobacco Use   Smoking status: Never   Smokeless tobacco: Never  Vaping Use   Vaping Use: Never used  Substance and Sexual Activity   Alcohol use: Yes    Alcohol/week: 0.0 standard drinks    Comment: Occasional   Drug use: No   Sexual activity: Yes    Birth control/protection: Post-menopausal  Other Topics Concern   Not on file  Social History Narrative   Lives at home w/ her husband   Caffeine 8-10 cups daily.  (coffee 1 cup am, unsw tea all day long).    Social Determinants of Health   Financial Resource Strain: Not on file  Food Insecurity: Not on file  Transportation Needs: Not on file  Physical Activity: Not on file  Stress: Not on file  Social Connections: Not on file  Intimate Partner Violence: Not on file    Family History  Problem Relation Age of Onset   CAD Mother    Hypertension Mother    Neuropathy Mother    Osteoarthritis Mother    Heart disease Mother    Breast cancer Maternal Grandmother    CAD Paternal Grandfather    Colon cancer Father     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   Arthritis    generalized   Asthma    Atrophic vaginitis    Back pain    DDD/stenosis   Carotid stenosis    Carotid US 10/16: Plaque RICA (1-01%), normal LICA   Depression    takes Cymbalta daily   Diverticulosis    benign   DJD (degenerative joint disease)    Dyslipidemia    Dysrhythmia    Family history of GI bleeding    GERD (gastroesophageal reflux disease)    takes Nexium daily   Gestational diabetes    Pt denies   H/O hiatal hernia    surgery for hernia   Headache(784.0)    takes Imitrex daily as needed and Bisoprolol daily;last  migraine was about 2wks ago   History of bronchitis 1 yr ago   History of shingles    Insomnia    takes Ambien nightly   Joint pain    Joint swelling    Leg cramps    takes Flexeril daily as needed   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     ASPIRIN 81 PO Take 81 mg by mouth at bedtime.     baclofen (LIORESAL) 10 MG tablet TAKE 1 TABLET(10 MG) BY MOUTH TWICE DAILY 60 tablet 0   bisoprolol (ZEBETA) 10 MG tablet Take 10 mg by mouth 2 (two) times daily.     calcium carbonate (OS-CAL) 1250 (500 CA) MG chewable tablet Chew 1 tablet by mouth daily. Not sure of dosage     diclofenac sodium (VOLTAREN) 1 % GEL Apply 4 g topically 4 (four) times daily. 100 g 11   DULoxetine (CYMBALTA) 60 MG capsule Take 60 mg by mouth at bedtime.  11   Erenumab-aooe (AIMOVIG) 140 MG/ML SOAJ Inject 140 mg into the skin every 30 (thirty) days. 1 mL 1   esomeprazole (NEXIUM) 40 MG capsule Take 40 mg by mouth 2 (two) times daily before a meal.   6   Evolocumab (REPATHA SURECLICK) 510 MG/ML SOAJ Inject 1 pen into the skin every 14 (fourteen) days. 2 mL 11   furosemide (LASIX) 20 MG tablet Take 40 mg by mouth daily as needed for fluid or edema.     gabapentin (NEURONTIN) 300 MG capsule Take 1 capsule (300 mg total) by mouth 4 (four) times daily. (Patient taking differently: Take 300-900 mg by mouth. 300 mg at 4 pm and 900 mg at bedtime) 120 capsule 5   HYDROcodone-acetaminophen (NORCO) 10-325 MG tablet Take 1-2 tablets by mouth every 4  (four) hours as needed for moderate pain or severe pain. Takes for migraines 30 tablet 0   Loratadine 10 MG CAPS Take 1 capsule by mouth as needed (for allergies).      ondansetron (ZOFRAN-ODT) 4 MG disintegrating tablet DISSOLVE 1 TABLET(4 MG) ON THE TONGUE EVERY 8 HOURS AS NEEDED FOR NAUSEA OR VOMITING 45 tablet 2   PROAIR HFA 108 (90 Base) MCG/ACT inhaler 2 INHALATIONS EVERY 4 HOURS AS NEEDED FOR ASTHMA SYMPTOMS (WHEEZING/SHORTNESS OF BREATH)  5   quiNINE (QUALAQUIN) 324 MG capsule TAKE ONE CAPSULE BY MOUTH TWICE A DAY 60 capsule 5   rOPINIRole (REQUIP) 1 MG tablet 1 tablet in the morning, 2 tablets in mid afternoon and in the evening 450 tablet 3   Vitamin D, Ergocalciferol, (DRISDOL) 1.25 MG (50000 UNIT) CAPS capsule Take 1 capsule (50,000 Units total) by mouth every 7 (seven) days. 13 capsule 1   No current facility-administered medications for this visit.    Allergies as of 07/08/2021 - Review Complete 07/08/2021  Allergen Reaction Noted   Atorvastatin  03/12/2021   Ezetimibe  03/12/2021    Vitals: BP 119/67   Pulse 87   Ht 5\' 7"  (1.702 m)   Wt 216 lb (98 kg)   BMI 33.83 kg/m  Last Weight:  Wt Readings from Last 1 Encounters:  07/08/21 216 lb (98 kg)       Last Height:   Ht Readings from Last 1 Encounters:  07/08/21 5\' 7"  (1.702 m)    Physical exam:  General: The patient is awake, alert and appears not in acute distress. The patient is well groomed. Head: Normocephalic, atraumatic. Neck is now supple.  No tenderness. Mallampati 3 ,  neck circumference: 15 inches. Nasal airflow unrestricted, TMJ is not evident. Retrognathia is seen.  Cardiovascular:  Regular rate and rhythm, without  murmurs or carotid bruit, and without distended neck veins. Respiratory: Lungs are clear to auscultation. Skin: Significant swelling and edema over both lower extremities, discoloration at the lower half of the lower extremities, feet are bluish but hot to touch skin is swollen, there is no  positive Bevelyn Buckles' sign but yet the patient describes pain with movement and at rest.  Trunk: BMI is elevated .  Neurologic exam :The patient is awake and alert, oriented to place and time.  Memory subjective described as intact.  There is a normal attention span & concentration ability. Speech is fluent without  dysarthria, dysphonia or aphasia. Mood and affect are appropriate. Cranial nerves: Pupils are equal and briskly reactive to light.  Extraocular movements  in vertical and horizontal planes intact ,without nystagmus.  Visual fields by finger perimetry are intact. Hearing to finger rub intact. Facial motor strength is symmetric and tongue and uvula move midline.  Shoulder shrug intact . Motor exam:  Normal tone, no cog-wheeling, muscle bulk and symmetric strength in all extremities. No cog wheeling and no waxy or clasp knife response.  Sensory:  Fine touch and vibration were not felt in either foot.  Proprioception in both feet is affeced by pin and needle dysesthesias.  Coordination: Rapid alternating movements in the fingers/hands is normal. Finger-to-nose maneuver normal without evidence of ataxia, dysmetria or tremor. Gait and station: Patient walks without assistive device . She is slightly waddling.  Strength within normal limits. Stance is stable and normal.  Deep tendon reflexes: in the  upper and lower extremities are symmetric. Babinski maneuver deferred.   Assessment:  After physical and neurologic examination, review of laboratory studies, imaging, neurophysiology testing and pre-existing records, assessment is   The patient was advised of the nature of the diagnosed sleep disorder , the treatment options and risks for general a health and wellness arising from not treating the condition. Visit duration was 40 minutes.  Of today's new patient visit-consultation 50% of our face-to-face time are spent in discussion of the medical conditions found the differential diagnosis the  testing and treatment options for the conditions. The patient is willing to undergo a new iron infusion cycle .  Her cardiologists note's support this Pernell Dupre ,MD ) , Dr. Waymon Budge.   Since Mrs. Fernicola has undergone a gastric bypass surgery in the past she has some mild absorption issues.  She is chronically iron deficient, she has been vitamin D deficient she has been vitamin B12 deficient. I have prescribed her a vitamin B12 nasal spray, vitamin D 50,000 units weekly pill, and I will obtain iron studies today to see which dose of an iron infusion would be indicated for her.  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 I would like for her to meet with a cardiologist to discuss the relevance to her MRI findings as well as to her clinical symptoms of headaches. The pain in the neck and headaches have both improved and I hope that she can continue Botox therapy through Dr. Elnita Maxwell as well as trigger point injections. Nerve blocks for neuralgia are also ordered. A download of her machine was not yet obtained but I reviewed her restless leg symptomatic and her RLS rating scale was endorsed at very low impediment there  is no evidence at this time of major quality of life impairment based on restless leg intensity. She is not depressed.   Is in the process of losing weight and she appears optimistic and motivated. I'm especially proud that she weaned herself off all narcotics.  Plan:  Treatment plan and additional workup :  She has described shortness of breath and all of this set on about 14 days after shoulder surgery.  I like to get DVT evaluation by lower extremity Doppler as soon as possible I also will order a chest x-ray to make sure there is no PE.  1 RLS with iron deficiency - additional neuropathic pain and numbness.    2. OSA on CPAP.  Highly compliant.    Dr. Beryle Beams  evaluated her discrepancy  between very low ferritin levels  and apparently sufficient iron storage. She has maintained oral iron intake as well as multivitamins and multi minerals. 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 Malabsorption related ?  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 She is not diabetic she has no documented thyroid disease and neither did her mother, nor has her daughter -but all of these women half severe painful dysesthesias, leg cramps, and restless legs.   She has now lower extremity edema, discoloration, the feet are puffy, shiny and painful to touch.  Gout ?    Rv in 6 month with me.   I thank Dr [name] for her headache expertise and treatment.  The patient has been relieved of neck pain and occipital neuralgic pain and pain with radiculopathic distribution to the shoulders.    Asencion Partridge Lasonia Casino MD  07/08/2021

## 2021-07-08 NOTE — Patient Instructions (Signed)
Neuropathic Pain Neuropathic pain is pain caused by damage to the nerves that are responsible for certain sensations in your body (sensory nerves). The pain can be caused by: Damage to the sensory nerves that send signals to your spinal cord and brain (peripheral nervous system). Damage to the sensory nerves in your brain or spinal cord (central nervous system). Neuropathic pain can make you more sensitive to pain. Even a minor sensation can feel very painful. This is usually a long-term condition that can be difficult to treat. The type of pain differs from person to person. It may: Start suddenly (acute), or it may develop slowly and last for a long time (chronic). Come and go as damaged nerves heal, or it may stay at the same level for years. Cause emotional distress, loss of sleep, and a lower quality of life. What are the causes? The most common cause of this condition is diabetes. Many other diseases and conditions can also cause neuropathic pain. Causes of neuropathic pain can be classified as: Toxic. This is caused by medicines and chemicals. The most common cause of toxic neuropathic pain is damage from cancer treatments (chemotherapy). Metabolic. This can be caused by: Diabetes. This is the most common disease that damages the nerves. Lack of vitamin B from long-term alcohol abuse. Traumatic. Any injury that cuts, crushes, or stretches a nerve can cause damage and pain. A common example is feeling pain after losing an arm or leg (phantom limb pain). Compression-related. If a sensory nerve gets trapped or compressed for a long period of time, the blood supply to the nerve can be cut off. Vascular. Many blood vessel diseases can cause neuropathic pain by decreasing blood supply and oxygen to nerves. Autoimmune. This type of pain results from diseases in which the body's defense system (immune system) mistakenly attacks sensory nerves. Examples of autoimmune diseases that can cause  neuropathic pain include lupus and multiple sclerosis. Infectious. Many types of viral infections can damage sensory nerves and cause pain. Shingles infection is a common cause of this type of pain. Inherited. Neuropathic pain can be a symptom of many diseases that are passed down through families (genetic). What increases the risk? You are more likely to develop this condition if: You have diabetes. You smoke. You drink too much alcohol. You are taking certain medicines, including medicines that kill cancer cells (chemotherapy) or that treat immune system disorders. What are the signs or symptoms? The main symptom is pain. Neuropathic pain is often described as: Burning. Shock-like. Stinging. Hot or cold. Itching. How is this diagnosed? No single test can diagnose neuropathic pain. It is diagnosed based on: Physical exam and your symptoms. Your health care provider will ask you about your pain. You may be asked to use a pain scale to describe how bad your pain is. Tests. These may be done to see if you have a high sensitivity to pain and to help find the cause and location of any sensory nerve damage. They include: Nerve conduction studies to test how well nerve signals travel through your sensory nerves (electrodiagnostic testing). Stimulating your sensory nerves through electrodes on your skin and measuring the response in your spinal cord and brain (somatosensory evoked potential). Imaging studies, such as: X-rays. CT scan. MRI. How is this treated? Treatment for neuropathic pain may change over time. You may need to try different treatment options or a combination of treatments. Some options include: Treating the underlying cause of the neuropathy, such as diabetes, kidney disease, or vitamin   deficiencies. Stopping medicines that can cause neuropathy, such as chemotherapy. Medicine to relieve pain. Medicines may include: Prescription or over-the-counter pain  medicine. Anti-seizure medicine. Antidepressant medicines. Pain-relieving patches that are applied to painful areas of skin. A medicine to numb the area (local anesthetic), which can be injected as a nerve block. Transcutaneous nerve stimulation. This uses electrical currents to block painful nerve signals. The treatment is painless. Alternative treatments, such as: Acupuncture. Meditation. Massage. Physical therapy. Pain management programs. Counseling. Follow these instructions at home: Medicines  Take over-the-counter and prescription medicines only as told by your health care provider. Do not drive or use heavy machinery while taking prescription pain medicine. If you are taking prescription pain medicine, take actions to prevent or treat constipation. Your health care provider may recommend that you: Drink enough fluid to keep your urine pale yellow. Eat foods that are high in fiber, such as fresh fruits and vegetables, whole grains, and beans. Limit foods that are high in fat and processed sugars, such as fried or sweet foods. Take an over-the-counter or prescription medicine for constipation. Lifestyle  Have a good support system at home. Consider joining a chronic pain support group. Do not use any products that contain nicotine or tobacco, such as cigarettes and e-cigarettes. If you need help quitting, ask your health care provider. Do not drink alcohol. General instructions Learn as much as you can about your condition. Work closely with all your health care providers to find the treatment plan that works best for you. Ask your health care provider what activities are safe for you. Keep all follow-up visits as told by your health care provider. This is important. Contact a health care provider if: Your pain treatments are not working. You are having side effects from your medicines. You are struggling with tiredness (fatigue), mood changes, depression, or  anxiety. Summary Neuropathic pain is pain caused by damage to the nerves that are responsible for certain sensations in your body (sensory nerves). Neuropathic pain may come and go as damaged nerves heal, or it may stay at the same level for years. Neuropathic pain is usually a long-term condition that can be difficult to treat. Consider joining a chronic pain support group. This information is not intended to replace advice given to you by your health care provider. Make sure you discuss any questions you have with your health care provider. Document Revised: 03/24/2019 Document Reviewed: 12/18/2017 Elsevier Patient Education  2022 Elsevier Inc.  

## 2021-07-12 DIAGNOSIS — Z96611 Presence of right artificial shoulder joint: Secondary | ICD-10-CM | POA: Diagnosis not present

## 2021-07-12 DIAGNOSIS — R296 Repeated falls: Secondary | ICD-10-CM | POA: Diagnosis not present

## 2021-07-12 DIAGNOSIS — M25611 Stiffness of right shoulder, not elsewhere classified: Secondary | ICD-10-CM | POA: Diagnosis not present

## 2021-07-13 DIAGNOSIS — H1033 Unspecified acute conjunctivitis, bilateral: Secondary | ICD-10-CM | POA: Diagnosis not present

## 2021-07-13 DIAGNOSIS — B9689 Other specified bacterial agents as the cause of diseases classified elsewhere: Secondary | ICD-10-CM | POA: Diagnosis not present

## 2021-07-13 DIAGNOSIS — J019 Acute sinusitis, unspecified: Secondary | ICD-10-CM | POA: Diagnosis not present

## 2021-07-16 DIAGNOSIS — R220 Localized swelling, mass and lump, head: Secondary | ICD-10-CM | POA: Diagnosis not present

## 2021-07-16 DIAGNOSIS — R41840 Attention and concentration deficit: Secondary | ICD-10-CM | POA: Diagnosis not present

## 2021-07-17 DIAGNOSIS — M25611 Stiffness of right shoulder, not elsewhere classified: Secondary | ICD-10-CM | POA: Diagnosis not present

## 2021-07-17 DIAGNOSIS — Z96611 Presence of right artificial shoulder joint: Secondary | ICD-10-CM | POA: Diagnosis not present

## 2021-07-17 DIAGNOSIS — R296 Repeated falls: Secondary | ICD-10-CM | POA: Diagnosis not present

## 2021-07-21 NOTE — Telephone Encounter (Signed)
Care coordination by phone

## 2021-07-23 DIAGNOSIS — H1045 Other chronic allergic conjunctivitis: Secondary | ICD-10-CM | POA: Diagnosis not present

## 2021-08-05 ENCOUNTER — Other Ambulatory Visit: Payer: Self-pay

## 2021-08-05 DIAGNOSIS — I872 Venous insufficiency (chronic) (peripheral): Secondary | ICD-10-CM

## 2021-08-14 DIAGNOSIS — R296 Repeated falls: Secondary | ICD-10-CM | POA: Diagnosis not present

## 2021-08-14 DIAGNOSIS — Z96611 Presence of right artificial shoulder joint: Secondary | ICD-10-CM | POA: Diagnosis not present

## 2021-08-14 DIAGNOSIS — B370 Candidal stomatitis: Secondary | ICD-10-CM | POA: Diagnosis not present

## 2021-08-14 DIAGNOSIS — G2581 Restless legs syndrome: Secondary | ICD-10-CM | POA: Diagnosis not present

## 2021-08-14 DIAGNOSIS — M25611 Stiffness of right shoulder, not elsewhere classified: Secondary | ICD-10-CM | POA: Diagnosis not present

## 2021-08-14 DIAGNOSIS — G629 Polyneuropathy, unspecified: Secondary | ICD-10-CM | POA: Diagnosis not present

## 2021-08-16 ENCOUNTER — Ambulatory Visit (INDEPENDENT_AMBULATORY_CARE_PROVIDER_SITE_OTHER): Payer: Medicare Other | Admitting: Physician Assistant

## 2021-08-16 ENCOUNTER — Ambulatory Visit (HOSPITAL_COMMUNITY)
Admission: RE | Admit: 2021-08-16 | Discharge: 2021-08-16 | Disposition: A | Discharge status: Home / Self Care | Payer: Medicare Other | Source: Ambulatory Visit | Attending: Vascular Surgery | Admitting: Vascular Surgery

## 2021-08-16 ENCOUNTER — Other Ambulatory Visit: Payer: Self-pay

## 2021-08-16 VITALS — BP 134/78 | HR 73 | Temp 98.2°F | Resp 20 | Ht 67.0 in | Wt 213.0 lb

## 2021-08-16 DIAGNOSIS — I872 Venous insufficiency (chronic) (peripheral): Secondary | ICD-10-CM

## 2021-08-16 DIAGNOSIS — I639 Cerebral infarction, unspecified: Secondary | ICD-10-CM

## 2021-08-16 DIAGNOSIS — I83811 Varicose veins of right lower extremities with pain: Secondary | ICD-10-CM | POA: Diagnosis not present

## 2021-08-16 NOTE — Progress Notes (Signed)
Requested by:  [name], DO [address],  [place] [address]  Reason for consultation: RLE venous insufficiency    History of Present Illness   [name] is a 75 y.o. (1946/03/24) female who presents for evaluation of bilateral lower extremity venous insufficiency and varicosities. She has been wearing knee high 20-30 mm Hg stockings but recently had right shoulder surgery and this is limiting her ability to don them. She has no history of DVT, vein procedures, no spontaneous bleeding, skin ulceration, CV disease. She is not diabetic and is not on blood thinners. She takes aspirin 81 mg daily. Reports pain with ambulation, but not resolved with rest. Takes Requip for restless leg syndrome.  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   Arthritis    generalized   Asthma    Atrophic vaginitis    Back pain    DDD/stenosis   Carotid stenosis    Carotid US 10/16: Plaque RICA (4-12%), normal LICA   Depression    takes Cymbalta daily   Diverticulosis    benign   DJD (degenerative joint disease)    Dyslipidemia    Dysrhythmia    Family history of GI bleeding    GERD (gastroesophageal reflux disease)    takes Nexium daily   Gestational diabetes    Pt denies   H/O hiatal hernia    surgery for hernia   Headache(784.0)    takes Imitrex daily as needed and Bisoprolol daily;last migraine was about 2wks ago   History of bronchitis 1 yr ago   History of shingles    Insomnia    takes Ambien nightly   Joint pain    Joint swelling     Leg cramps    takes Flexeril daily as needed   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   Nocturia    Osteoporosis    Peripheral neuropathy    takes Gabapentin daily   Pneumonia 67yrs ago   hx of   PONV (postoperative nausea and vomiting)    Restless leg syndrome    takes Requip daily   RLS (restless legs syndrome) 08/16/2015   Sleep apnea    uses CPAP   Stroke ([place])    Tubular adenoma of colon    Vertigo    Vitamin D deficiency     Past Surgical History:  Procedure Laterality Date   COLONOSCOPY     EP IMPLANTABLE DEVICE N/A 11/19/2016   Procedure: Loop Recorder Insertion;  Surgeon: Deboraha Sprang, MD;  Location: [facility];  Service: Cardiovascular;  Laterality: N/A;   ESOPHAGOGASTRODUODENOSCOPY     excess skin removal     post weight loss   GASTRIC BYPASS  2001   HERNIA REPAIR     umbilical   JOINT REPLACEMENT Right    knee   REVERSE SHOULDER ARTHROPLASTY Right 04/25/2021   Procedure: REVERSE SHOULDER ARTHROPLASTY carpal tunel realase right wrist;  Surgeon: Tania Ade, MD;  Location:  [facility];  Service: Orthopedics;  Laterality: Right;   skin reduction  2003 2004   stomach and arm   STEROID INJECTION TO SCAR     x 2 in back    TOTAL KNEE ARTHROPLASTY Left 04/03/2015   Procedure: LEFT TOTAL KNEE ARTHROPLASTY;  Surgeon: Melrose Nakayama, MD;  Location: [facility];  Service: Orthopedics;  Laterality: Left;   WISDOM TOOTH EXTRACTION      Social History   Socioeconomic History   Marital status: Married    Spouse name: Pilar Jarvis   Number of children: 3   Years of education: college   Highest education level: Not on file  Occupational History   Occupation: Retired  Tobacco Use   Smoking status: Never   Smokeless tobacco: Never  Vaping Use   Vaping Use: Never used  Substance and Sexual Activity   Alcohol use: Yes    Alcohol/week: 0.0 standard drinks    Comment: Occasional   Drug use: No   Sexual activity: Yes    Birth control/protection: Post-menopausal  Other  Topics Concern   Not on file  Social History Narrative   Lives at home w/ her husband   Caffeine 8-10 cups daily.  (coffee 1 cup am, unsw tea all day long).    Social Determinants of Health   Financial Resource Strain: Not on file  Food Insecurity: Not on file  Transportation Needs: Not on file  Physical Activity: Not on file  Stress: Not on file  Social Connections: Not on file  Intimate Partner Violence: Not on file    Family History  Problem Relation Age of Onset   CAD Mother    Hypertension Mother    Neuropathy Mother    Osteoarthritis Mother    Heart disease Mother    Breast cancer Maternal Grandmother    CAD Paternal Grandfather    Colon cancer Father     Current Outpatient Medications  Medication Sig Dispense Refill   amitriptyline (ELAVIL) 100 MG tablet Take 100 mg by mouth at bedtime.     ASPIRIN 81 PO Take 81 mg by mouth at bedtime.     baclofen (LIORESAL) 10 MG tablet TAKE 1 TABLET(10 MG) BY MOUTH TWICE DAILY 60 tablet 0   bisoprolol (ZEBETA) 10 MG tablet Take 10 mg by mouth 2 (two) times daily.     calcium carbonate (OS-CAL) 1250 (500 CA) MG chewable tablet Chew 1 tablet by mouth daily. Not sure of dosage     diclofenac sodium (VOLTAREN) 1 % GEL Apply 4 g topically 4 (four) times daily. 100 g 11   DULoxetine (CYMBALTA) 60 MG capsule Take 60 mg by mouth at bedtime.  11   Erenumab-aooe (AIMOVIG) 140 MG/ML SOAJ Inject 140 mg into the skin every 30 (thirty) days. 1 mL 1   esomeprazole (NEXIUM) 40 MG capsule Take 40 mg by mouth 2 (two) times daily before a meal.   6   Evolocumab (REPATHA SURECLICK) 195 MG/ML SOAJ Inject 1 pen into the skin every 14 (fourteen) days. 2 mL 11   furosemide (LASIX) 20 MG tablet Take 40 mg by mouth daily as needed for fluid or edema.     gabapentin (NEURONTIN) 300 MG capsule Take 1 capsule (300 mg total) by mouth 4 (four) times daily. (Patient taking differently: Take 300-900 mg by mouth. 300 mg at 4 pm and 900 mg at bedtime) 120 capsule 5    HYDROcodone-acetaminophen (NORCO) 10-325 MG tablet Take 1-2 tablets by mouth every 4 (four) hours  as needed for moderate pain or severe pain. Takes for migraines 30 tablet 0   Loratadine 10 MG CAPS Take 1 capsule by mouth as needed (for allergies).      ondansetron (ZOFRAN-ODT) 4 MG disintegrating tablet DISSOLVE 1 TABLET(4 MG) ON THE TONGUE EVERY 8 HOURS AS NEEDED FOR NAUSEA OR VOMITING 45 tablet 2   PROAIR HFA 108 (90 Base) MCG/ACT inhaler 2 INHALATIONS EVERY 4 HOURS AS NEEDED FOR ASTHMA SYMPTOMS (WHEEZING/SHORTNESS OF BREATH)  5   quiNINE (QUALAQUIN) 324 MG capsule TAKE ONE CAPSULE BY MOUTH TWICE A DAY 60 capsule 5   rOPINIRole (REQUIP) 1 MG tablet 1 tablet in the morning, 2 tablets in mid afternoon and in the evening 450 tablet 3   Vitamin D, Ergocalciferol, (DRISDOL) 1.25 MG (50000 UNIT) CAPS capsule Take 1 capsule (50,000 Units total) by mouth every 7 (seven) days. 13 capsule 1   No current facility-administered medications for this visit.    Allergies  Allergen Reactions   Atorvastatin     Other reaction(s): myalgia   Ezetimibe     Other reaction(s): myalgia    REVIEW OF SYSTEMS (negative unless checked):   Cardiac:  []  Chest pain or chest pressure? []  Shortness of breath upon activity? []  Shortness of breath when lying flat? []  Irregular heart rhythm?  Vascular:  [x]  Pain in calf, thigh, or hip brought on by walking? []  Pain in feet at night that wakes you up from your sleep? []  Blood clot in your veins? [x]  Leg swelling?  Pulmonary:  []  Oxygen at home? []  Productive cough? []  Wheezing?  Neurologic:  []  Sudden weakness in arms or legs? []  Sudden numbness in arms or legs? []  Sudden onset of difficult speaking or slurred speech? []  Temporary loss of vision in one eye? []  Problems with dizziness?  Gastrointestinal:  []  Blood in stool? []  Vomited blood?  Genitourinary:  []  Burning when urinating? []  Blood in urine?  Psychiatric:  []  Major  depression  Hematologic:  []  Bleeding problems? []  Problems with blood clotting?  Dermatologic:  []  Rashes or ulcers?  Constitutional:  []  Fever or chills?  Ear/Nose/Throat:  []  Change in hearing? []  Nose bleeds? []  Sore throat?  Musculoskeletal:  []  Back pain? []  Joint pain? []  Muscle pain?   Physical Examination     Vitals:   08/16/21 1339  BP: 134/78  Pulse: 73  Resp: 20  Temp: 98.2 F (36.8 C)  SpO2: 98%     General:  WDWN in NAD; vital signs documented above Gait:unaided; HENT: WNL, normocephalic Pulmonary: normal non-labored breathing , without Rales, rhonchi,  wheezing Cardiac: regular HR, without  Murmurs without carotid bruits Skin: with rashes>>bilateral stasis dermatitis Vascular Exam/Pulses: 2+ dorsalis pedis, posterior tibial pulses bilaterally Extremities: with varicose veins, with reticular veins, without edema, with stasis pigmentation, without lipodermatosclerosis, without ulcers Musculoskeletal: no muscle wasting or atrophy  Neurologic: A&O X 3;  No focal weakness or paresthesias are detected Psychiatric:  The pt has Normal affect.           Non-invasive Vascular Imaging   RLE Venous Insufficiency Duplex 08/16/2021 Right:  - No evidence of deep vein thrombosis seen in the right lower extremity,  from the common femoral through the popliteal veins.  - No evidence of superficial venous thrombosis in the right lower  extremity.  - Deep vein reflux in the CFV.  - Superficial vein reflux in the SFJ and the GSV at the knee and proximal  calf.     *See  table(s) above for measurements and observations.     Preliminary     Medical Decision Making   [name] is a 75 y.o. female who presents with: RLE chronic C4a venous insufficiency, right varicose veins with complications. No evidence of DVT or arterial insufficiency. She has reflux at the right SFJ and a segment of reflux of the right GSV with marginal dilatation.  Based on the  patient's history and examination, I recommend: healthy vein measures. Printed instructions given and explained in detail. Discussed what to do in case of spontaneous vein bleeding and proved patient with ACE wrap. I discussed with the patient the use of her 20-30 mm thigh high compression stockings and need for 3 month trial of such. The patient will follow up in 3 months with Dr. Scot Dock. I will have reflux study done of her left leg at that visit as she has similar symptoms on the left. Thank you for allowing [place] to participate in this patient's care.   Barbie Banner, PA-C [facility] Office: [phone]  08/16/2021, 1:33 PM  Clinic MD: Dr. Scot Dock on-call

## 2021-08-20 DIAGNOSIS — Z96611 Presence of right artificial shoulder joint: Secondary | ICD-10-CM | POA: Diagnosis not present

## 2021-08-20 DIAGNOSIS — R296 Repeated falls: Secondary | ICD-10-CM | POA: Diagnosis not present

## 2021-08-20 DIAGNOSIS — M25611 Stiffness of right shoulder, not elsewhere classified: Secondary | ICD-10-CM | POA: Diagnosis not present

## 2021-08-21 ENCOUNTER — Other Ambulatory Visit: Payer: Self-pay

## 2021-08-21 DIAGNOSIS — I872 Venous insufficiency (chronic) (peripheral): Secondary | ICD-10-CM

## 2021-08-23 DIAGNOSIS — M25611 Stiffness of right shoulder, not elsewhere classified: Secondary | ICD-10-CM | POA: Diagnosis not present

## 2021-08-23 DIAGNOSIS — Z96611 Presence of right artificial shoulder joint: Secondary | ICD-10-CM | POA: Diagnosis not present

## 2021-08-23 DIAGNOSIS — R296 Repeated falls: Secondary | ICD-10-CM | POA: Diagnosis not present

## 2021-08-27 DIAGNOSIS — M25611 Stiffness of right shoulder, not elsewhere classified: Secondary | ICD-10-CM | POA: Diagnosis not present

## 2021-08-27 DIAGNOSIS — R296 Repeated falls: Secondary | ICD-10-CM | POA: Diagnosis not present

## 2021-08-27 DIAGNOSIS — Z96611 Presence of right artificial shoulder joint: Secondary | ICD-10-CM | POA: Diagnosis not present

## 2021-08-30 DIAGNOSIS — R296 Repeated falls: Secondary | ICD-10-CM | POA: Diagnosis not present

## 2021-08-30 DIAGNOSIS — M25611 Stiffness of right shoulder, not elsewhere classified: Secondary | ICD-10-CM | POA: Diagnosis not present

## 2021-08-30 DIAGNOSIS — Z96611 Presence of right artificial shoulder joint: Secondary | ICD-10-CM | POA: Diagnosis not present

## 2021-09-02 DIAGNOSIS — Z471 Aftercare following joint replacement surgery: Secondary | ICD-10-CM | POA: Diagnosis not present

## 2021-09-02 DIAGNOSIS — Z96611 Presence of right artificial shoulder joint: Secondary | ICD-10-CM | POA: Diagnosis not present

## 2021-09-03 ENCOUNTER — Encounter: Payer: Self-pay | Admitting: Neurology

## 2021-09-03 ENCOUNTER — Other Ambulatory Visit: Payer: Self-pay | Admitting: Family Medicine

## 2021-09-03 ENCOUNTER — Other Ambulatory Visit: Payer: Self-pay

## 2021-09-03 ENCOUNTER — Ambulatory Visit (INDEPENDENT_AMBULATORY_CARE_PROVIDER_SITE_OTHER): Payer: Medicare Other | Admitting: Neurology

## 2021-09-03 VITALS — Wt 212.0 lb

## 2021-09-03 DIAGNOSIS — Z1231 Encounter for screening mammogram for malignant neoplasm of breast: Secondary | ICD-10-CM

## 2021-09-03 DIAGNOSIS — G43711 Chronic migraine without aura, intractable, with status migrainosus: Secondary | ICD-10-CM | POA: Diagnosis not present

## 2021-09-03 MED ORDER — QUININE SULFATE 324 MG PO CAPS
ORAL_CAPSULE | ORAL | 3 refills | Status: DC
Start: 2021-09-03 — End: 2021-09-03

## 2021-09-03 MED ORDER — QUININE SULFATE 324 MG PO CAPS: ORAL_CAPSULE | ORAL | 3 refills | Status: DC

## 2021-09-03 NOTE — Progress Notes (Signed)
Consent Form Botulism Toxin Injection For Chronic Migraine  09/03/2021: Doing great, stable > 80% decrease in migraine and headache freq  and severity. +a    Reviewed orally with patient, additionally signature is on file:  Botulism toxin has been approved by the Federal drug administration for treatment of chronic migraine. Botulism toxin does not cure chronic migraine and it may not be effective in some patients.  The administration of botulism toxin is accomplished by injecting a small amount of toxin into the muscles of the neck and head. Dosage must be titrated for each individual. Any benefits resulting from botulism toxin tend to wear off after 3 months with a repeat injection required if benefit is to be maintained. Injections are usually done every 3-4 months with maximum effect peak achieved by about 2 or 3 weeks. Botulism toxin is expensive and you should be sure of what costs you will incur resulting from the injection.  The side effects of botulism toxin use for chronic migraine may include:   -Transient, and usually mild, facial weakness with facial injections  -Transient, and usually mild, head or neck weakness with head/neck injections  -Reduction or loss of forehead facial animation due to forehead muscle weakness  -Eyelid drooping  -Dry eye  -Pain at the site of injection or bruising at the site of injection  -Double vision  -Potential unknown long term risks  Contraindications: You should not have Botox if you are pregnant, nursing, allergic to albumin, have an infection, skin condition, or muscle weakness at the site of the injection, or have myasthenia gravis, Lambert-Eaton syndrome, or ALS.  It is also possible that as with any injection, there may be an allergic reaction or no effect from the medication. Reduced effectiveness after repeated injections is sometimes seen and rarely infection at the injection site may occur. All care will be taken to prevent these side  effects. If therapy is given over a long time, atrophy and wasting in the muscle injected may occur. Occasionally the patient's become refractory to treatment because they develop antibodies to the toxin. In this event, therapy needs to be modified.  I have read the above information and consent to the administration of botulism toxin.    BOTOX PROCEDURE NOTE FOR MIGRAINE HEADACHE    Contraindications and precautions discussed with patient(above). Aseptic procedure was observed and patient tolerated procedure. Procedure performed by Dr. Georgia Dom  The condition has existed for more than 6 months, and pt does not have a diagnosis of ALS, Myasthenia Gravis or Lambert-Eaton Syndrome.  Risks and benefits of injections discussed and pt agrees to proceed with the procedure.  Written consent obtained  These injections are medically necessary. Pt  receives good benefits from these injections. These injections do not cause sedations or hallucinations which the oral therapies may cause.  Description of procedure:  The patient was placed in a sitting position. The standard protocol was used for Botox as follows, with 5 units of Botox injected at each site:   -Procerus muscle, midline injection  -Corrugator muscle, bilateral injection  -Frontalis muscle, bilateral injection, with 2 sites each side, medial injection was performed in the upper one third of the frontalis muscle, in the region vertical from the medial inferior edge of the superior orbital rim. The lateral injection was again in the upper one third of the forehead vertically above the lateral limbus of the cornea, 1.5 cm lateral to the medial injection site.  -Temporalis muscle injection, 4 sites, bilaterally. The first  injection was 3 cm above the tragus of the ear, second injection site was 1.5 cm to 3 cm up from the first injection site in line with the tragus of the ear. The third injection site was 1.5-3 cm forward between the first  2 injection sites. The fourth injection site was 1.5 cm posterior to the second injection site.   -Occipitalis muscle injection, 3 sites, bilaterally. The first injection was done one half way between the occipital protuberance and the tip of the mastoid process behind the ear. The second injection site was done lateral and superior to the first, 1 fingerbreadth from the first injection. The third injection site was 1 fingerbreadth superiorly and medially from the first injection site.  -Cervical paraspinal muscle injection, 2 sites, bilateral knee first injection site was 1 cm from the midline of the cervical spine, 3 cm inferior to the lower border of the occipital protuberance. The second injection site was 1.5 cm superiorly and laterally to the first injection site.  -Trapezius muscle injection was performed at 3 sites, bilaterally. The first injection site was in the upper trapezius muscle halfway between the inflection point of the neck, and the acromion. The second injection site was one half way between the acromion and the first injection site. The third injection was done between the first injection site and the inflection point of the neck.   Will return for repeat injection in 3 months.   A 155 unit sof Botox was used, 45U Botox not injected was wasted. The patient tolerated the procedure well, there were no complications of the above procedure.

## 2021-09-03 NOTE — Progress Notes (Signed)
Botox- 200 units x 1 vial Lot: [id number] Expiration: 01/2024 NDC: 0023-3921-02  Bacteriostatic 0.9% Sodium Chloride- 4mL total Lot: [id number] Expiration: 12/15/2022 NDC: 0409-1966-02  Dx: G43.711 B/B  

## 2021-09-04 ENCOUNTER — Ambulatory Visit
Admission: RE | Admit: 2021-09-04 | Discharge: 2021-09-04 | Disposition: A | Discharge status: Home / Self Care | Payer: Medicare Other | Source: Ambulatory Visit | Attending: Family Medicine | Admitting: Family Medicine

## 2021-09-04 ENCOUNTER — Other Ambulatory Visit: Payer: Self-pay

## 2021-09-04 DIAGNOSIS — Z1231 Encounter for screening mammogram for malignant neoplasm of breast: Secondary | ICD-10-CM

## 2021-09-19 DIAGNOSIS — Z23 Encounter for immunization: Secondary | ICD-10-CM | POA: Diagnosis not present

## 2021-09-19 DIAGNOSIS — K146 Glossodynia: Secondary | ICD-10-CM | POA: Diagnosis not present

## 2021-09-19 DIAGNOSIS — Z808 Family history of malignant neoplasm of other organs or systems: Secondary | ICD-10-CM | POA: Diagnosis not present

## 2021-09-19 DIAGNOSIS — D225 Melanocytic nevi of trunk: Secondary | ICD-10-CM | POA: Diagnosis not present

## 2021-09-19 DIAGNOSIS — L821 Other seborrheic keratosis: Secondary | ICD-10-CM | POA: Diagnosis not present

## 2021-09-19 DIAGNOSIS — I872 Venous insufficiency (chronic) (peripheral): Secondary | ICD-10-CM | POA: Diagnosis not present

## 2021-09-19 DIAGNOSIS — D1801 Hemangioma of skin and subcutaneous tissue: Secondary | ICD-10-CM | POA: Diagnosis not present

## 2021-09-26 ENCOUNTER — Telehealth: Payer: Self-pay

## 2021-09-26 NOTE — Telephone Encounter (Signed)
I submitted a PA request for Aimovig 140mg /ml and received instant approval through [place].  ZYSAYT:[id number];ATFTDD:[id number];Review Type:Prior Auth;Coverage Start Date:08/27/2021;Coverage End Date:09/26/2022;

## 2021-10-09 ENCOUNTER — Other Ambulatory Visit: Payer: Self-pay | Admitting: Physician Assistant

## 2021-10-09 DIAGNOSIS — K639 Disease of intestine, unspecified: Secondary | ICD-10-CM | POA: Diagnosis not present

## 2021-10-09 DIAGNOSIS — Z800 Family history of malignant neoplasm of digestive organs: Secondary | ICD-10-CM | POA: Diagnosis not present

## 2021-10-09 DIAGNOSIS — R1030 Lower abdominal pain, unspecified: Secondary | ICD-10-CM

## 2021-10-09 DIAGNOSIS — K625 Hemorrhage of anus and rectum: Secondary | ICD-10-CM | POA: Diagnosis not present

## 2021-10-09 DIAGNOSIS — Z86010 Personal history of colonic polyps: Secondary | ICD-10-CM | POA: Diagnosis not present

## 2021-10-09 DIAGNOSIS — R109 Unspecified abdominal pain: Secondary | ICD-10-CM | POA: Diagnosis not present

## 2021-10-09 DIAGNOSIS — D649 Anemia, unspecified: Secondary | ICD-10-CM | POA: Diagnosis not present

## 2021-10-09 DIAGNOSIS — R634 Abnormal weight loss: Secondary | ICD-10-CM | POA: Diagnosis not present

## 2021-10-11 DIAGNOSIS — K625 Hemorrhage of anus and rectum: Secondary | ICD-10-CM | POA: Diagnosis not present

## 2021-10-14 DIAGNOSIS — Z96611 Presence of right artificial shoulder joint: Secondary | ICD-10-CM | POA: Diagnosis not present

## 2021-10-14 DIAGNOSIS — M25511 Pain in right shoulder: Secondary | ICD-10-CM | POA: Diagnosis not present

## 2021-10-16 ENCOUNTER — Ambulatory Visit
Admission: RE | Admit: 2021-10-16 | Discharge: 2021-10-16 | Disposition: A | Discharge status: Home / Self Care | Payer: Medicare Other | Source: Ambulatory Visit | Attending: Physician Assistant | Admitting: Physician Assistant

## 2021-10-16 DIAGNOSIS — K8020 Calculus of gallbladder without cholecystitis without obstruction: Secondary | ICD-10-CM | POA: Diagnosis not present

## 2021-10-16 DIAGNOSIS — R1030 Lower abdominal pain, unspecified: Secondary | ICD-10-CM

## 2021-10-16 DIAGNOSIS — N281 Cyst of kidney, acquired: Secondary | ICD-10-CM | POA: Diagnosis not present

## 2021-10-16 DIAGNOSIS — K5730 Diverticulosis of large intestine without perforation or abscess without bleeding: Secondary | ICD-10-CM | POA: Diagnosis not present

## 2021-10-16 DIAGNOSIS — K5939 Other megacolon: Secondary | ICD-10-CM | POA: Diagnosis not present

## 2021-10-16 IMAGING — CT CT ABD-PELV W/ CM
2 of 5 series · 13 of 46 positions shown, 15 images · IV contrast (iopamidol)
Comparison: None.

CLINICAL DATA: Lower abdominal pain, nausea and bloating for 2
weeks. Previous ventral hernia repair and gastric bypass.

EXAM:
CT ABDOMEN AND PELVIS WITH CONTRAST
TECHNIQUE: Multidetector CT imaging of the abdomen and pelvis was performed
using the standard protocol following bolus administration of
intravenous contrast.
CONTRAST:  100mL [B0] IOPAMIDOL ([B0]) INJECTION 61%

[Series 2: abd pelvis 5.00 br40 s3 axial · axial · 0.71mm/px · z∈[+1313,+1733]mm · 10 of 96 slices shown, 12 images]
[im 6/96  soft-tissue]
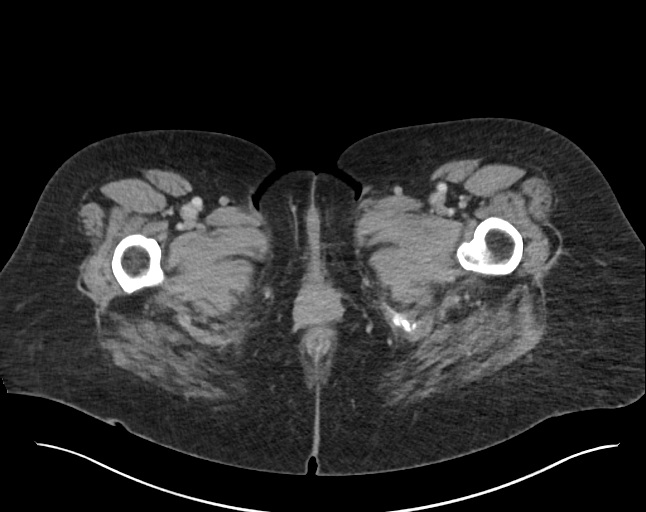
[im 6/96  bone]
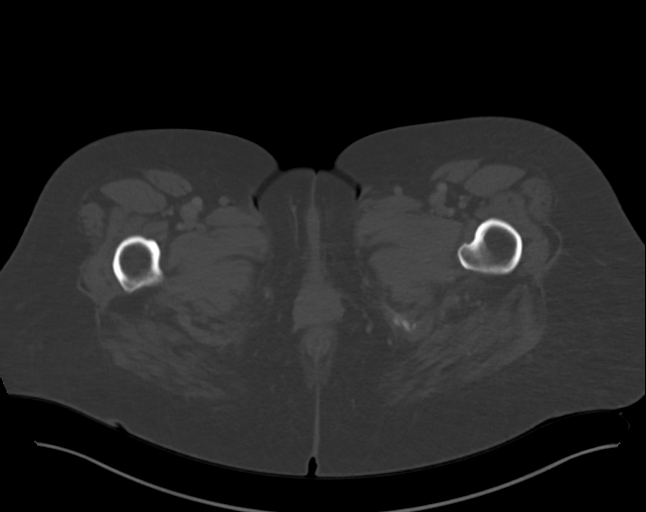
[im 16/96  soft-tissue]
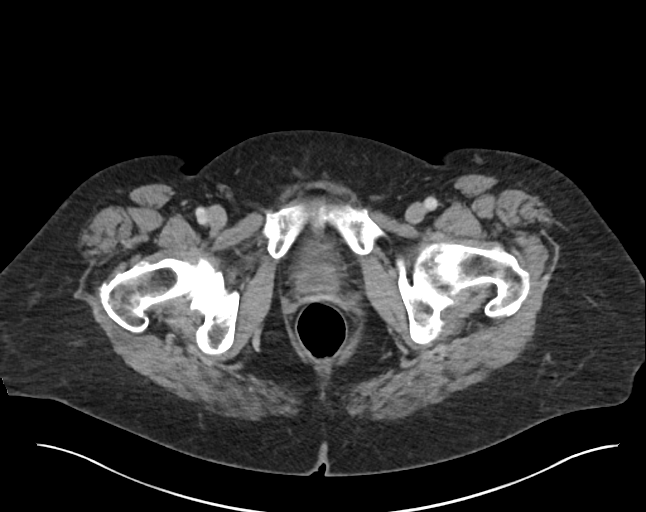
[im 27/96  soft-tissue]
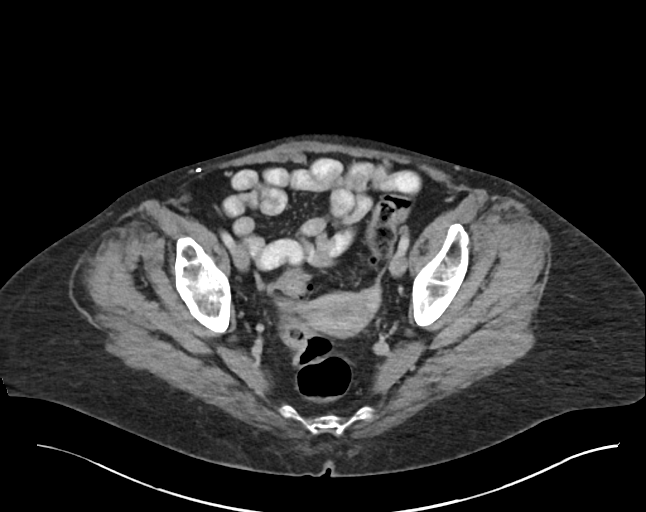
[im 32/96  soft-tissue]
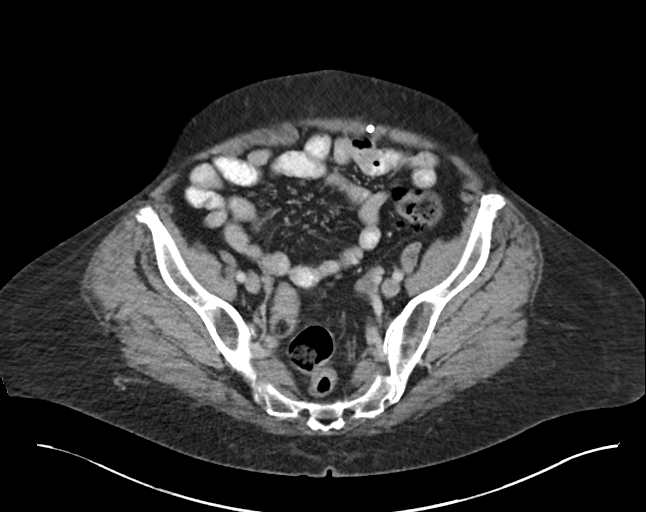
[im 43/96  soft-tissue]
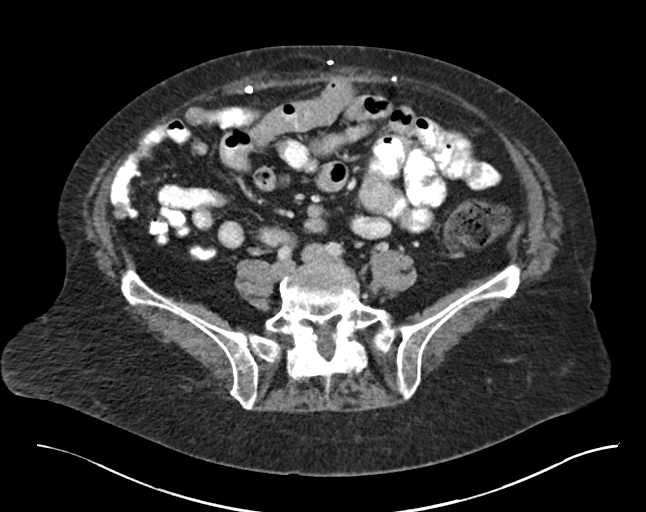
[im 53/96  soft-tissue]
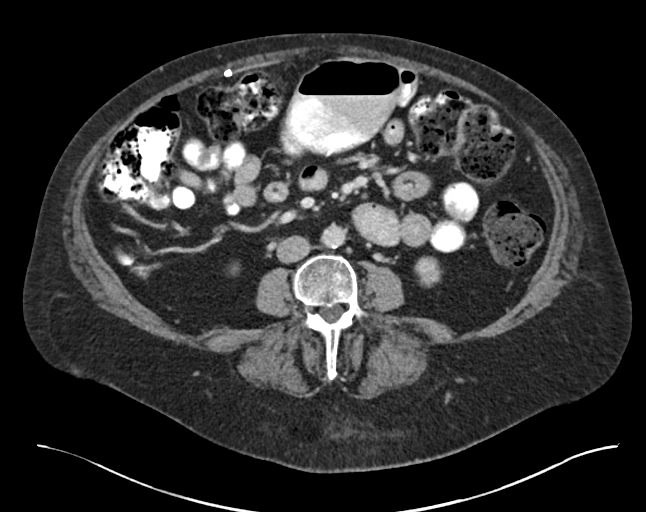
[im 64/96  soft-tissue]
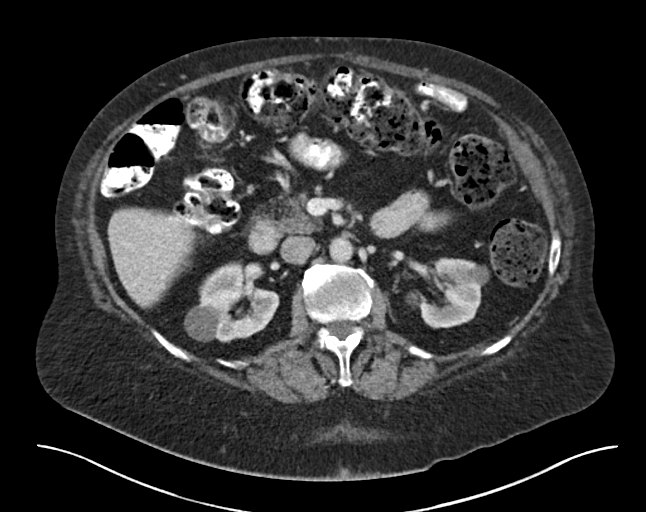
[im 69/96  soft-tissue]
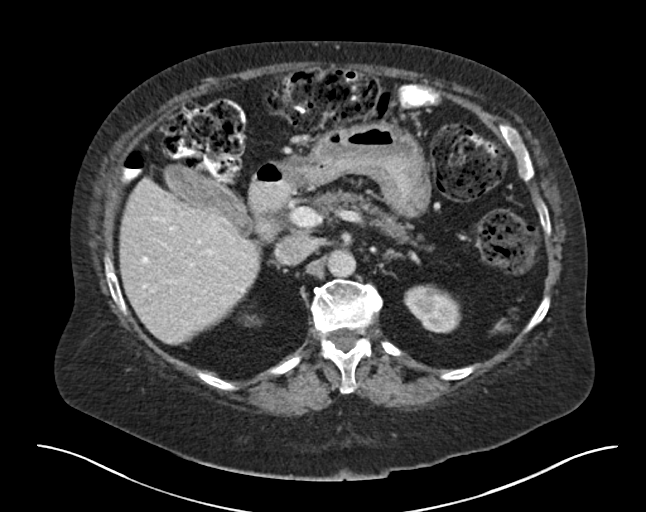
[im 80/96  soft-tissue]
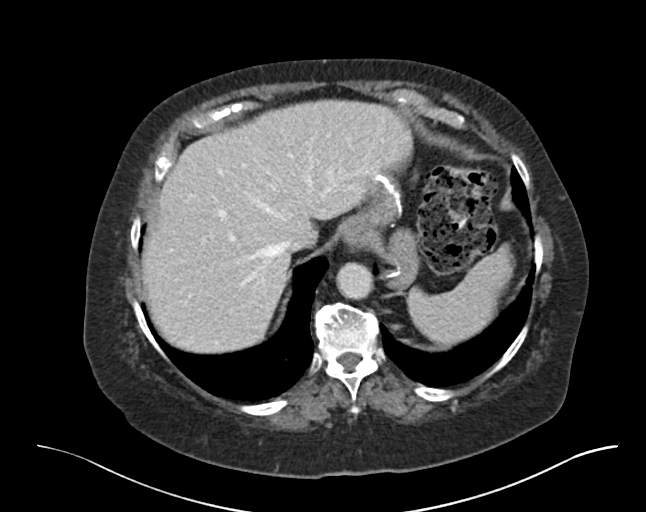
[im 80/96  bone]
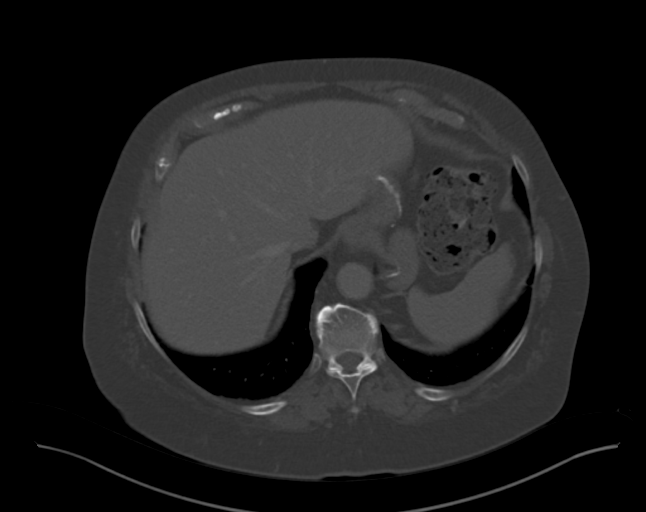
[im 90/96  soft-tissue]
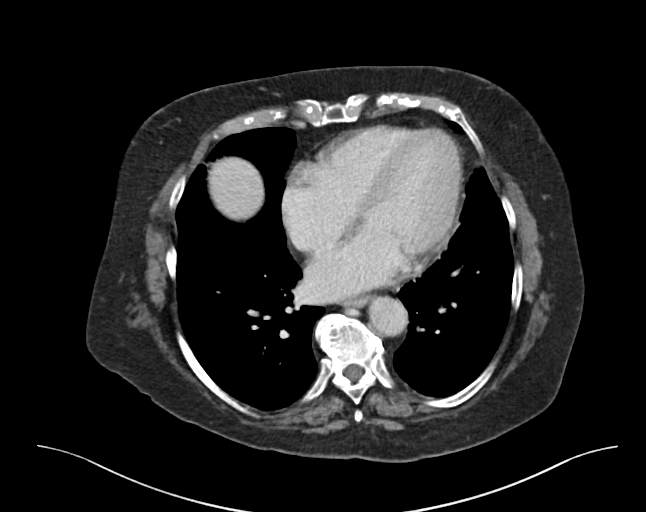

[Series 6: abd pelvis 2.00 br40 s3 cor · coronal · 0.89mm/px · 3 of 181 slices shown]
[im 61/181  soft-tissue]
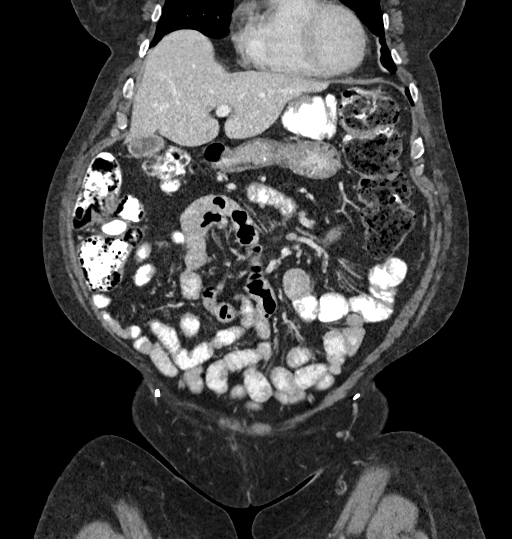
[im 81/181  soft-tissue]
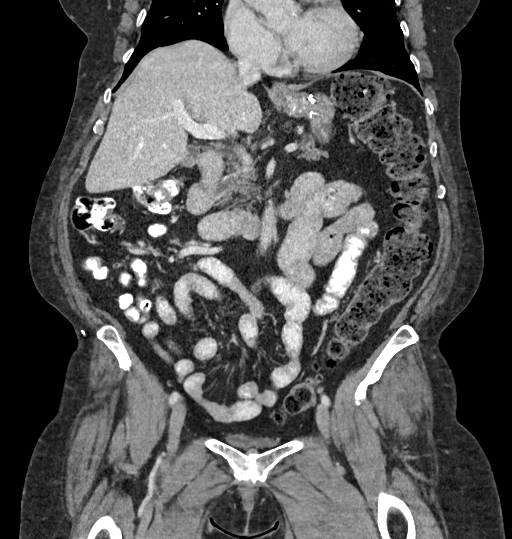
[im 101/181  soft-tissue]
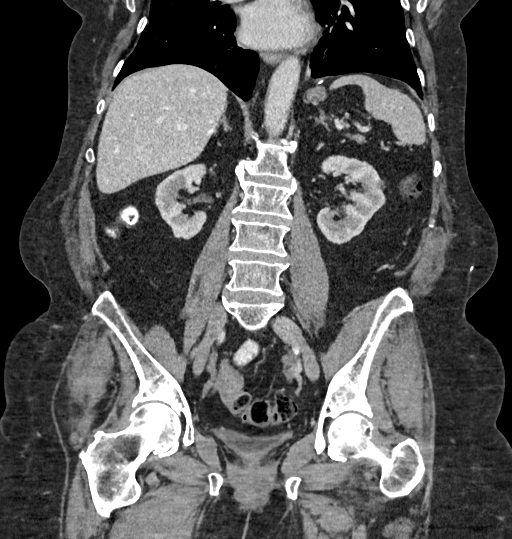

[13 of 46 positions shown; findings below may reference images not displayed]

FINDINGS: Lower chest: Minor bibasilar scarring. Normal heart size. No
pericardial or pleural effusion. Degenerative changes of the spine.
No acute lower chest finding.

Hepatobiliary: No focal hepatic abnormality. Gallstones again noted
within a collapsed gallbladder. No surrounding inflammation or
fluid. Biliary tree nondilated. Hepatic and portal veins are patent.

Pancreas: Unremarkable. No pancreatic ductal dilatation or
surrounding inflammatory changes.

Spleen: Stable 11 mm subcapsular splenic hypodensity, favored to be
small cyst. No other splenic abnormality. No surrounding free fluid.

Adrenals/Urinary Tract: Normal adrenal glands. Stable bilateral
hypodense renal cysts. No renal obstruction or hydronephrosis.
Ureters are symmetric and decompressed. No ureteral calculus.
Bladder collapsed.

Stomach/Bowel: Similar postop changes from gastric bypass. Negative
for bowel obstruction. Similar chronic dilatation at the small bowel
anastomosis with a contrast air-fluid level. Scattered
diverticulosis most pronounced in the sigmoid. Moderate colonic
stool burden throughout. No acute inflammatory process. Appendix not
visualized.

No free fluid, fluid collection, hemorrhage, hematoma, abscess or
ascites.

Vascular/Lymphatic: Aorta atherosclerotic. Negative for aneurysm or
dissection. No occlusive process. Mesenteric and renal vasculature
appear patent. No JAMES process. No adenopathy.

Reproductive: Uterus and bilateral adnexa are unremarkable.

Other: Postop changes of the abdominal wall from ventral hernia
repair. No recurrent hernia. No inguinal abnormality.

Musculoskeletal: Bilateral L5 pars defects with associated grade 1
anterolisthesis of L5 upon S1 and advanced degenerative spondylosis.
Additional thoracolumbar degenerative changes throughout the spine.
No acute osseous finding.
IMPRESSION: No acute intra-abdominal or pelvic finding by CT.

Stable postop changes from gastric bypass.

Cholelithiasis

## 2021-10-16 MED ORDER — IOPAMIDOL (ISOVUE-300) INJECTION 61%
100.0000 mL | Freq: Once | INTRAVENOUS | Status: AC | PRN
  Administered 2021-10-16: 100 mL via INTRAVENOUS

## 2021-10-17 DIAGNOSIS — M47816 Spondylosis without myelopathy or radiculopathy, lumbar region: Secondary | ICD-10-CM | POA: Diagnosis not present

## 2021-10-17 DIAGNOSIS — G2581 Restless legs syndrome: Secondary | ICD-10-CM | POA: Diagnosis not present

## 2021-10-17 DIAGNOSIS — M7989 Other specified soft tissue disorders: Secondary | ICD-10-CM | POA: Diagnosis not present

## 2021-10-17 DIAGNOSIS — Z23 Encounter for immunization: Secondary | ICD-10-CM | POA: Diagnosis not present

## 2021-10-17 DIAGNOSIS — M79604 Pain in right leg: Secondary | ICD-10-CM | POA: Diagnosis not present

## 2021-10-24 ENCOUNTER — Encounter: Payer: Self-pay | Admitting: Neurology

## 2021-10-28 ENCOUNTER — Other Ambulatory Visit: Payer: Medicare Other

## 2021-11-11 DIAGNOSIS — E538 Deficiency of other specified B group vitamins: Secondary | ICD-10-CM | POA: Diagnosis not present

## 2021-11-11 DIAGNOSIS — E7800 Pure hypercholesterolemia, unspecified: Secondary | ICD-10-CM | POA: Diagnosis not present

## 2021-11-11 DIAGNOSIS — E790 Hyperuricemia without signs of inflammatory arthritis and tophaceous disease: Secondary | ICD-10-CM | POA: Diagnosis not present

## 2021-11-11 DIAGNOSIS — H6692 Otitis media, unspecified, left ear: Secondary | ICD-10-CM | POA: Diagnosis not present

## 2021-11-11 DIAGNOSIS — I10 Essential (primary) hypertension: Secondary | ICD-10-CM | POA: Diagnosis not present

## 2021-11-11 DIAGNOSIS — M79604 Pain in right leg: Secondary | ICD-10-CM | POA: Diagnosis not present

## 2021-11-11 DIAGNOSIS — J988 Other specified respiratory disorders: Secondary | ICD-10-CM | POA: Diagnosis not present

## 2021-11-11 DIAGNOSIS — D509 Iron deficiency anemia, unspecified: Secondary | ICD-10-CM | POA: Diagnosis not present

## 2021-11-11 DIAGNOSIS — M47816 Spondylosis without myelopathy or radiculopathy, lumbar region: Secondary | ICD-10-CM | POA: Diagnosis not present

## 2021-11-18 ENCOUNTER — Telehealth: Payer: Self-pay | Admitting: Interventional Cardiology

## 2021-11-18 DIAGNOSIS — D508 Other iron deficiency anemias: Secondary | ICD-10-CM | POA: Diagnosis not present

## 2021-11-18 DIAGNOSIS — K5080 Crohn's disease of both small and large intestine without complications: Secondary | ICD-10-CM | POA: Diagnosis not present

## 2021-11-18 DIAGNOSIS — Z800 Family history of malignant neoplasm of digestive organs: Secondary | ICD-10-CM | POA: Diagnosis not present

## 2021-11-18 DIAGNOSIS — R634 Abnormal weight loss: Secondary | ICD-10-CM | POA: Diagnosis not present

## 2021-11-18 DIAGNOSIS — R1010 Upper abdominal pain, unspecified: Secondary | ICD-10-CM | POA: Diagnosis not present

## 2021-11-18 DIAGNOSIS — K5900 Constipation, unspecified: Secondary | ICD-10-CM | POA: Diagnosis not present

## 2021-11-18 DIAGNOSIS — K902 Blind loop syndrome, not elsewhere classified: Secondary | ICD-10-CM | POA: Diagnosis not present

## 2021-11-18 DIAGNOSIS — D519 Vitamin B12 deficiency anemia, unspecified: Secondary | ICD-10-CM | POA: Diagnosis not present

## 2021-11-18 DIAGNOSIS — Z86010 Personal history of colonic polyps: Secondary | ICD-10-CM | POA: Diagnosis not present

## 2021-11-18 NOTE — Telephone Encounter (Signed)
Spoke with patient informed her that her loop recorder has been inactive since 07/2020 and that she was ok to do biofeedback therapy, informed her that there is no "electricity" in the loop recorder. Informed her that there is no harm in keeping the loop recorder in, informed patient of procedure to removed loop recorder patient will keep loop recorder in.

## 2021-11-18 NOTE — Telephone Encounter (Signed)
The patient called and asked if it is safe for her to do BioFeedback Therapy.  The patient has a loop recorder and she wanted to double check if she could have this type of therapy done. She was under the impression her loop recorder battery was dead, she just wanted to double check.  If the battery is in fact dead, she would like to know what she needs to do to have the device removed. Please advise

## 2021-11-20 ENCOUNTER — Other Ambulatory Visit: Payer: Self-pay

## 2021-11-20 ENCOUNTER — Ambulatory Visit (INDEPENDENT_AMBULATORY_CARE_PROVIDER_SITE_OTHER): Payer: Medicare Other | Admitting: Vascular Surgery

## 2021-11-20 ENCOUNTER — Ambulatory Visit (HOSPITAL_COMMUNITY)
Admission: RE | Admit: 2021-11-20 | Discharge: 2021-11-20 | Disposition: A | Discharge status: Home / Self Care | Payer: Medicare Other | Source: Ambulatory Visit | Attending: Vascular Surgery | Admitting: Vascular Surgery

## 2021-11-20 ENCOUNTER — Encounter: Payer: Self-pay | Admitting: Vascular Surgery

## 2021-11-20 VITALS — BP 143/79 | HR 87 | Temp 97.7°F | Resp 16 | Ht 67.0 in | Wt 197.0 lb

## 2021-11-20 DIAGNOSIS — I872 Venous insufficiency (chronic) (peripheral): Secondary | ICD-10-CM | POA: Insufficient documentation

## 2021-11-20 DIAGNOSIS — I83813 Varicose veins of bilateral lower extremities with pain: Secondary | ICD-10-CM

## 2021-11-20 DIAGNOSIS — I639 Cerebral infarction, unspecified: Secondary | ICD-10-CM | POA: Diagnosis not present

## 2021-11-20 NOTE — Progress Notes (Signed)
REASON FOR VISIT:   Follow-up of chronic venous insufficiency  MEDICAL ISSUES:   CHRONIC VENOUS INSUFFICIENCY: This patient does have some small varicose veins and spider and reticular veins bilaterally but really no significant superficial venous reflux.  On the right side she does have some deep venous reflux in the common femoral vein and a little bit of reflux in the right great saphenous vein although the vein is not especially dilated.  We have discussed the importance of intermittent leg elevation and the proper positioning for this.  In addition we are fitting her for some knee-high compression stockings with a gradient of 15 to 20 mmHg.  We have discussed the importance of exercise specifically walking and water aerobics.  She just joined at [employer] and I have encouraged her to try to use their pool as they have an excellent water aerobics program.  She has lost significant weight and does intermittent fasting.  I think she is lost over 50 pounds.  Certainly this is helpful with venous disease.  We have also discussed the importance of keeping her skin well lubricated.  I will plan on seeing her back in 1 year and we will get follow-up studies on the right leg    HPI:   [name] is a pleasant 75 y.o. female who was seen by the physicians assistant on 08/16/2021 with venous insufficiency.  She had varicose veins of both lower extremities.  She had been wearing knee-high compression stockings with a gradient of 20 to 30 mmHg.  Patient had no previous history of DVT or previous venous procedures.  She had some small varicose veins of both lower extremities.  She was prescribed thigh-high compression stockings with a gradient of 20 to 30 mmHg, encouraged to elevate her legs, and exercise.  She comes in for 96-month follow-up visit.   On my history today, her main complaint is neuropathy in both feet.  She is actually seeing a neuropathy specialist and has undergone some therapy which seems  to be helping.  She has been elevating her legs which helps her symptoms and also wearing thigh-high compression stockings.  She does complain of aching pain and heaviness in her legs which is aggravated by sitting and standing and relieved with elevation.  Of note she is undergone a right shoulder replacement in May and is doing well from that standpoint.  She is now able to get her compression stockings on.  I do not get any clear-cut history of claudication or rest pain.  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   Arthritis    generalized   Asthma    Atrophic vaginitis    Back pain    DDD/stenosis   Carotid stenosis    Carotid US 10/16: Plaque RICA (5-36%), normal LICA   Depression    takes Cymbalta daily   Diverticulosis    benign   DJD (degenerative joint disease)    Dyslipidemia    Dysrhythmia    Family history of GI bleeding    GERD (gastroesophageal reflux disease)    takes Nexium daily   Gestational diabetes    Pt denies   H/O hiatal hernia    surgery for hernia   Headache(784.0)    takes Imitrex daily as needed and Bisoprolol daily;last migraine was about 2wks ago   History of bronchitis 1 yr ago   History of shingles    Insomnia  takes Ambien nightly   Joint pain    Joint swelling    Leg cramps    takes Flexeril daily as needed   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   Nocturia    Osteoporosis    Peripheral neuropathy    takes Gabapentin daily   Pneumonia 46yrs ago   hx of   PONV (postoperative nausea and vomiting)    Restless leg syndrome    takes Requip daily   RLS (restless legs syndrome) 08/16/2015   Sleep apnea    uses CPAP   Stroke ([place])    Tubular adenoma of colon    Vertigo    Vitamin D deficiency     Family History  Problem Relation Age of Onset   CAD Mother    Hypertension Mother    Neuropathy Mother    Osteoarthritis Mother    Heart disease Mother    Breast cancer Maternal Grandmother     CAD Paternal Grandfather    Colon cancer Father     SOCIAL HISTORY: Social History   Tobacco Use   Smoking status: Never   Smokeless tobacco: Never  Substance Use Topics   Alcohol use: Yes    Alcohol/week: 0.0 standard drinks    Comment: Occasional    Allergies  Allergen Reactions   Atorvastatin     Other reaction(s): myalgia   Crestor [Rosuvastatin]     Other reaction(s): myalgia   Ezetimibe     Other reaction(s): myalgia   Amitriptyline Rash    Current Outpatient Medications  Medication Sig Dispense Refill   ASPIRIN 81 PO Take 81 mg by mouth at bedtime.     baclofen (LIORESAL) 10 MG tablet TAKE 1 TABLET(10 MG) BY MOUTH TWICE DAILY 60 tablet 0   bisoprolol (ZEBETA) 10 MG tablet Take 10 mg by mouth 2 (two) times daily.     calcium carbonate (OS-CAL) 1250 (500 CA) MG chewable tablet Chew 1 tablet by mouth daily. Not sure of dosage     diclofenac sodium (VOLTAREN) 1 % GEL Apply 4 g topically 4 (four) times daily. 100 g 11   DULoxetine (CYMBALTA) 60 MG capsule Take 60 mg by mouth at bedtime.  11   Erenumab-aooe (AIMOVIG) 140 MG/ML SOAJ Inject 140 mg into the skin every 30 (thirty) days. 1 mL 1   esomeprazole (NEXIUM) 40 MG capsule Take 40 mg by mouth 2 (two) times daily before a meal.   6   Evolocumab (REPATHA SURECLICK) 093 MG/ML SOAJ Inject 1 pen into the skin every 14 (fourteen) days. 2 mL 11   furosemide (LASIX) 20 MG tablet Take 40 mg by mouth daily as needed for fluid or edema.     gabapentin (NEURONTIN) 300 MG capsule Take 1 capsule (300 mg total) by mouth 4 (four) times daily. (Patient taking differently: Take 300-900 mg by mouth. 300 mg at 4 pm and 900 mg at bedtime) 120 capsule 5   HYDROcodone-acetaminophen (NORCO) 10-325 MG tablet Take 1-2 tablets by mouth every 4 (four) hours as needed for moderate pain or severe pain. Takes for migraines 30 tablet 0   Loratadine 10 MG CAPS Take 1 capsule by mouth as needed (for allergies).      ondansetron (ZOFRAN-ODT) 4 MG  disintegrating tablet DISSOLVE 1 TABLET(4 MG) ON THE TONGUE EVERY 8 HOURS AS NEEDED FOR NAUSEA OR VOMITING 45 tablet 2   PROAIR HFA 108 (90 Base) MCG/ACT inhaler 2 INHALATIONS EVERY 4 HOURS AS NEEDED FOR ASTHMA SYMPTOMS (WHEEZING/SHORTNESS OF BREATH)  5  quiNINE (QUALAQUIN) 324 MG capsule TAKE ONE CAPSULE BY MOUTH TWICE A DAY 60 capsule 3   rOPINIRole (REQUIP) 1 MG tablet 1 tablet in the morning, 2 tablets in mid afternoon and in the evening 450 tablet 3   Vitamin D, Ergocalciferol, (DRISDOL) 1.25 MG (50000 UNIT) CAPS capsule Take 1 capsule (50,000 Units total) by mouth every 7 (seven) days. 13 capsule 1   No current facility-administered medications for this visit.    REVIEW OF SYSTEMS:  [X]  denotes positive finding, [ ]  denotes negative finding Cardiac  Comments:  Chest pain or chest pressure:    Shortness of breath upon exertion:    Short of breath when lying flat:    Irregular heart rhythm:        Vascular    Pain in calf, thigh, or hip brought on by ambulation: x   Pain in feet at night that wakes you up from your sleep:  x   Blood clot in your veins:    Leg swelling:  x       Pulmonary    Oxygen at home:    Productive cough:     Wheezing:         Neurologic    Sudden weakness in arms or legs:     Sudden numbness in arms or legs:     Sudden onset of difficulty speaking or slurred speech:    Temporary loss of vision in one eye:     Problems with dizziness:         Gastrointestinal    Blood in stool:     Vomited blood:         Genitourinary    Burning when urinating:     Blood in urine:        Psychiatric    Major depression:         Hematologic    Bleeding problems:    Problems with blood clotting too easily:        Skin    Rashes or ulcers:        Constitutional    Fever or chills:     PHYSICAL EXAM:   Vitals:   11/20/21 1322  BP: (!) 143/79  Pulse: 87  Resp: 16  Temp: 97.7 F (36.5 C)  TempSrc: Temporal  SpO2: 99%  Weight: 197 lb (89.4 kg)   Height: 5\' 7"  (1.702 m)    GENERAL: The patient is a well-nourished female, in no acute distress. The vital signs are documented above. CARDIAC: There is a regular rate and rhythm.  VASCULAR: I do not detect carotid bruits. On the right side, I cannot palpate pedal pulses however she has a biphasic dorsalis pedis and posterior tibial signal. On the left side, she does have a palpable dorsalis pedis pulse.  I have biphasic dorsalis pedis and posterior tibial signals on the left. She has some dependent rubor on both legs. She has spider veins and some small varicose veins in both legs and mild bilateral lower extremity swelling. PULMONARY: There is good air exchange bilaterally without wheezing or rales. ABDOMEN: Soft and non-tender with normal pitched bowel sounds.  MUSCULOSKELETAL: There are no major deformities or cyanosis. NEUROLOGIC: No focal weakness or paresthesias are detected. SKIN: There are no ulcers or rashes noted. PSYCHIATRIC: The patient has a normal affect.  DATA:    VENOUS DUPLEX RIGHT LOWER EXTREMITY: I reviewed the venous duplex scan that was done of the right lower extremity in September of this year.  There was no evidence of DVT.  There was deep venous reflux in the common femoral vein.  There was some superficial venous reflux at the saphenofemoral junction and at the knee and proximal calf.  However the vein was not especially dilated.     VENOUS DUPLEX LEFT LOWER EXTREMITY: I have independently interpreted the venous duplex scan of the left lower extremity today.  There was no evidence of DVT.  There was no deep venous reflux.  There was no significant superficial venous reflux.      Deitra Mayo [facility] [phone]

## 2021-12-03 ENCOUNTER — Ambulatory Visit (INDEPENDENT_AMBULATORY_CARE_PROVIDER_SITE_OTHER): Payer: Medicare Other | Admitting: Neurology

## 2021-12-03 ENCOUNTER — Other Ambulatory Visit (INDEPENDENT_AMBULATORY_CARE_PROVIDER_SITE_OTHER): Payer: Self-pay

## 2021-12-03 ENCOUNTER — Other Ambulatory Visit: Payer: Self-pay

## 2021-12-03 ENCOUNTER — Other Ambulatory Visit: Payer: Self-pay | Admitting: *Deleted

## 2021-12-03 ENCOUNTER — Encounter: Payer: Self-pay | Admitting: Neurology

## 2021-12-03 VITALS — Ht 66.0 in | Wt 198.8 lb

## 2021-12-03 DIAGNOSIS — E611 Iron deficiency: Secondary | ICD-10-CM

## 2021-12-03 DIAGNOSIS — G43711 Chronic migraine without aura, intractable, with status migrainosus: Secondary | ICD-10-CM | POA: Diagnosis not present

## 2021-12-03 DIAGNOSIS — Z0289 Encounter for other administrative examinations: Secondary | ICD-10-CM
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
## 2021-12-03 NOTE — Progress Notes (Signed)
Botox- 200 units x 1 vial Lot: [id number] Expiration: 08/2024 NDC: 3570-1779-39   0.9% Sodium Chloride- 68mL total Lot: [id number] Expiration: 11/2022 NDC: 30076-226-33  Dx: H54.562 B/B

## 2021-12-04 LAB — IRON,TIBC AND FERRITIN PANEL
Ferritin: 140 ng/mL (ref 15–150)
Iron Saturation: 17 % (ref 15–55)
Iron: 54 ug/dL (ref 27–139)
Total Iron Binding Capacity: 313 ug/dL (ref 250–450)
UIBC: 259 ug/dL (ref 118–369)

## 2021-12-04 NOTE — Progress Notes (Signed)
Normal iron levels post infusion.

## 2021-12-10 ENCOUNTER — Other Ambulatory Visit: Payer: Self-pay

## 2021-12-10 DIAGNOSIS — E785 Hyperlipidemia, unspecified: Secondary | ICD-10-CM

## 2021-12-13 ENCOUNTER — Other Ambulatory Visit: Payer: Self-pay

## 2021-12-13 ENCOUNTER — Ambulatory Visit: Payer: Self-pay

## 2021-12-13 ENCOUNTER — Emergency Department (HOSPITAL_BASED_OUTPATIENT_CLINIC_OR_DEPARTMENT_OTHER)
Admission: EM | Admit: 2021-12-13 | Discharge: 2021-12-13 | Discharge status: Against Medical Advice | Payer: Medicare Other | Attending: Emergency Medicine | Admitting: Emergency Medicine

## 2021-12-13 ENCOUNTER — Encounter (HOSPITAL_BASED_OUTPATIENT_CLINIC_OR_DEPARTMENT_OTHER): Payer: Self-pay | Admitting: Emergency Medicine

## 2021-12-13 DIAGNOSIS — Z23 Encounter for immunization: Secondary | ICD-10-CM | POA: Insufficient documentation

## 2021-12-13 DIAGNOSIS — X088XXA Exposure to other specified smoke, fire and flames, initial encounter: Secondary | ICD-10-CM | POA: Insufficient documentation

## 2021-12-13 DIAGNOSIS — T24031A Burn of unspecified degree of right lower leg, initial encounter: Secondary | ICD-10-CM | POA: Diagnosis not present

## 2021-12-13 DIAGNOSIS — T310 Burns involving less than 10% of body surface: Secondary | ICD-10-CM | POA: Insufficient documentation

## 2021-12-13 DIAGNOSIS — Z5321 Procedure and treatment not carried out due to patient leaving prior to being seen by health care provider: Secondary | ICD-10-CM | POA: Diagnosis not present

## 2021-12-13 DIAGNOSIS — T24032A Burn of unspecified degree of left lower leg, initial encounter: Secondary | ICD-10-CM | POA: Diagnosis not present

## 2021-12-13 LAB — CBC WITH DIFFERENTIAL/PLATELET
Abs Immature Granulocytes: 0.03 10*3/uL (ref 0.00–0.07)
Basophils Absolute: 0.1 10*3/uL (ref 0.0–0.1)
Basophils Relative: 1 %
Eosinophils Absolute: 0.2 10*3/uL (ref 0.0–0.5)
Eosinophils Relative: 2 %
HCT: 36.7 % (ref 36.0–46.0)
Hemoglobin: 11.5 g/dL — ABNORMAL LOW (ref 12.0–15.0)
Immature Granulocytes: 0 %
Lymphocytes Relative: 13 %
Lymphs Abs: 1.1 10*3/uL (ref 0.7–4.0)
MCH: 31.4 pg (ref 26.0–34.0)
MCHC: 31.3 g/dL (ref 30.0–36.0)
MCV: 100.3 fL — ABNORMAL HIGH (ref 80.0–100.0)
Monocytes Absolute: 0.5 10*3/uL (ref 0.1–1.0)
Monocytes Relative: 6 %
Neutro Abs: 6.7 10*3/uL (ref 1.7–7.7)
Neutrophils Relative %: 78 %
Platelets: 327 10*3/uL (ref 150–400)
RBC: 3.66 MIL/uL — ABNORMAL LOW (ref 3.87–5.11)
RDW: 14.4 % (ref 11.5–15.5)
WBC: 8.6 10*3/uL (ref 4.0–10.5)
nRBC: 0 % (ref 0.0–0.2)

## 2021-12-13 LAB — BASIC METABOLIC PANEL
Anion gap: 9 (ref 5–15)
BUN: 26 mg/dL — ABNORMAL HIGH (ref 8–23)
CO2: 27 mmol/L (ref 22–32)
Calcium: 8.9 mg/dL (ref 8.9–10.3)
Chloride: 103 mmol/L (ref 98–111)
Creatinine, Ser: 1.44 mg/dL — ABNORMAL HIGH (ref 0.44–1.00)
GFR, Estimated: 38 mL/min — ABNORMAL LOW (ref >60)
Glucose, Bld: 102 mg/dL — ABNORMAL HIGH (ref 70–99)
Potassium: 4.1 mmol/L (ref 3.5–5.1)
Sodium: 139 mmol/L (ref 135–145)

## 2021-12-13 MED ORDER — TETANUS-DIPHTH-ACELL PERTUSSIS 5-2.5-18.5 LF-MCG/0.5 IM SUSY
0.5000 mL | PREFILLED_SYRINGE | Freq: Once | INTRAMUSCULAR | Status: AC
  Administered 2021-12-13: 19:00:00 0.5 mL via INTRAMUSCULAR
  Filled 2021-12-13: qty 0.5
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
## 2021-12-18 ENCOUNTER — Encounter: Payer: Medicare Other | Attending: Internal Medicine | Admitting: Internal Medicine

## 2021-12-18 ENCOUNTER — Other Ambulatory Visit: Payer: Self-pay

## 2021-12-18 DIAGNOSIS — G4733 Obstructive sleep apnea (adult) (pediatric): Secondary | ICD-10-CM | POA: Insufficient documentation

## 2021-12-18 DIAGNOSIS — T24031A Burn of unspecified degree of right lower leg, initial encounter: Secondary | ICD-10-CM | POA: Diagnosis not present

## 2021-12-18 DIAGNOSIS — S81802A Unspecified open wound, left lower leg, initial encounter: Secondary | ICD-10-CM | POA: Diagnosis not present

## 2021-12-18 DIAGNOSIS — L0390 Cellulitis, unspecified: Secondary | ICD-10-CM | POA: Diagnosis not present

## 2021-12-18 DIAGNOSIS — X58XXXA Exposure to other specified factors, initial encounter: Secondary | ICD-10-CM | POA: Diagnosis not present

## 2021-12-18 DIAGNOSIS — T24032A Burn of unspecified degree of left lower leg, initial encounter: Secondary | ICD-10-CM | POA: Diagnosis not present

## 2021-12-18 DIAGNOSIS — S81801A Unspecified open wound, right lower leg, initial encounter: Secondary | ICD-10-CM | POA: Diagnosis not present

## 2021-12-20 NOTE — Progress Notes (Signed)
[name] ([id number]) Visit Report for 12/18/2021 Abuse/Suicide Risk Screen Details Patient Name: [name], [name]. Date of Service: 12/18/2021 2:00 PM Medical Record Number: [id number] Patient Account Number: [id number] Date of Birth/Sex: [birth year] (76 y.o. F) Treating RN: Carlene Coria Primary Care Brooke Payes: [name] Other Clinician: Referring Joanathan Affeldt: [name] Treating Farmer Mccahill/Extender: Yaakov Guthrie in Treatment: 0 Abuse/Suicide Risk Screen Items Answer ABUSE RISK SCREEN: Has anyone close to you tried to hurt or harm you recentlyo No Do you feel uncomfortable with anyone in your familyo No Has anyone forced you do things that you didnot want to doo No Electronic Signature(s) Signed: 12/20/2021 4:33:01 PM By: Carlene Coria RN Entered By: Carlene Coria on 12/18/2021 14:21:42 [name] ([id number]) -------------------------------------------------------------------------------- Activities of Daily Living Details Patient Name: [name]. Date of Service: 12/18/2021 2:00 PM Medical Record Number: [id number] Patient Account Number: [id number] Date of Birth/Sex: [birth year] (76 y.o. F) Treating RN: Carlene Coria Primary Care Zacharie Portner: [name] Other Clinician: Referring Jacelynn Hayton: [name] Treating Khalfani Weideman/Extender: Yaakov Guthrie in Treatment: 0 Activities of Daily Living Items Answer Activities of Daily Living (Please select one for each item) Drive Automobile Completely Able Take Medications Completely Able Use Telephone Completely Able Care for Appearance Completely Able Use Toilet Completely Able Bath / Shower Completely Able Dress Self Completely Able Feed Self Completely Able Walk Completely Able Get In / Out Bed Completely Able Housework Completely Able Prepare Meals Completely Able Handle Money Completely Able Shop for Self Completely Able Electronic Signature(s) Signed: 12/20/2021 4:33:01 PM By: Carlene Coria RN Entered By: Carlene Coria on 12/18/2021 14:22:04 [name] ([id number]) -------------------------------------------------------------------------------- Education Screening Details Patient Name: [name]. Date of Service: 12/18/2021 2:00 PM Medical Record Number: [id number] Patient Account Number: [id number] Date of Birth/Sex: Mar 31, 1946 (76 y.o. F) Treating RN: Carlene Coria Primary Care Ray Glacken: [name] Other Clinician: Referring Briana Farner: [name] Treating Prim Morace/Extender: Yaakov Guthrie in Treatment: 0 Primary Learner Assessed: Patient Learning Preferences/Education Level/Primary Language Learning Preference: Explanation Highest Education Level: High School Preferred Language: English Cognitive Barrier Language Barrier: No Translator Needed: No Memory Deficit: No Emotional Barrier: No Cultural/Religious Beliefs Affecting Medical Care: No Physical Barrier Impaired Vision: Yes Glasses Impaired Hearing: No Decreased Hand dexterity: No Knowledge/Comprehension Knowledge Level: Medium Comprehension Level: High Ability to understand written instructions: High Ability to understand verbal instructions: High Motivation Anxiety Level: Anxious Cooperation: Cooperative Education Importance: Acknowledges Need Perception: Coherent Willingness to Engage in Self-Management High Activities: Readiness to Engage in Self-Management High Activities: Electronic Signature(s) Signed: 12/20/2021 4:33:01 PM By: Carlene Coria RN Entered By: Carlene Coria on 12/18/2021 14:22:31 [name] ([id number]) -------------------------------------------------------------------------------- Fall Risk Assessment Details Patient Name: [name]. Date of Service: 12/18/2021 2:00 PM Medical Record Number: [id number] Patient Account Number: [id number] Date of Birth/Sex: 05/30/1946 (76 y.o. F) Treating RN: Carlene Coria Primary Care Jaira Canady: [name] Other Clinician: Referring Slayden Mennenga:  [name] Treating Alexiya Franqui/Extender: Yaakov Guthrie in Treatment: 0 Fall Risk Assessment Items Have you had 2 or more falls in the last 12 monthso 0 No Have you had any fall that resulted in injury in the last 12 monthso 0 No FALLS RISK SCREEN History of falling - immediate or within 3 months 0 No Secondary diagnosis (Do you have 2 or more medical diagnoseso) 0 No Ambulatory aid None/bed rest/wheelchair/nurse 0 No Crutches/cane/walker 0 No Furniture 0 No Intravenous therapy Access/Saline/Heparin Lock 0 No Gait/Transferring Normal/ bed rest/ wheelchair 0 No Weak (short steps with or without shuffle, stooped but able  to lift head while walking, may 0 No seek support from furniture) Impaired (short steps with shuffle, may have difficulty arising from chair, head down, impaired 0 No balance) Mental Status Oriented to own ability 0 No Electronic Signature(s) Signed: 12/20/2021 4:33:01 PM By: Carlene Coria RN Entered By: Carlene Coria on 12/18/2021 14:22:40 [name] ([id number]) -------------------------------------------------------------------------------- Foot Assessment Details Patient Name: [name]. Date of Service: 12/18/2021 2:00 PM Medical Record Number: [id number] Patient Account Number: [id number] Date of Birth/Sex: [birth year] (76 y.o. F) Treating RN: Carlene Coria Primary Care Katheryne Gorr: [name] Other Clinician: Referring Jana Swartzlander: [name] Treating Chidiebere Wynn/Extender: Yaakov Guthrie in Treatment: 0 Foot Assessment Items Site Locations + = Sensation present, - = Sensation absent, C = Callus, U = Ulcer R = Redness, W = Warmth, M = Maceration, PU = Pre-ulcerative lesion F = Fissure, S = Swelling, D = Dryness Assessment Right: Left: Other Deformity: No No Prior Foot Ulcer: No No Prior Amputation: No No Charcot Joint: No No Ambulatory Status: Gait: Electronic Signature(s) Signed: 12/20/2021 4:33:01 PM By: Carlene Coria RN Entered By:  Carlene Coria on 12/18/2021 14:32:58 [name] ([id number]) -------------------------------------------------------------------------------- Nutrition Risk Screening Details Patient Name: Stief, Maryann [name]. Date of Service: 12/18/2021 2:00 PM Medical Record Number: [id number] Patient Account Number: [id number] Date of Birth/Sex: January 10, 1946 (76 y.o. F) Treating RN: Carlene Coria Primary Care Gatlyn Lipari: [name] Other Clinician: Referring Syndey Jaskolski: [name] Treating Kearah Gayden/Extender: Yaakov Guthrie in Treatment: 0 Height (in): Weight (lbs): Body Mass Index (BMI): Nutrition Risk Screening Items Score Screening NUTRITION RISK SCREEN: I have an illness or condition that made me change the kind and/or amount of food I eat 0 No I eat fewer than two meals per day 0 No I eat few fruits and vegetables, or milk products 0 No I have three or more drinks of beer, liquor or wine almost every day 0 No I have tooth or mouth problems that make it hard for me to eat 0 No I don't always have enough money to buy the food I need 0 No I eat alone most of the time 0 No I take three or more different prescribed or over-the-counter drugs a day 1 Yes Without wanting to, I have lost or gained 10 pounds in the last six months 0 No I am not always physically able to shop, cook and/or feed myself 0 No Nutrition Protocols Good Risk Protocol Moderate Risk Protocol High Risk Proctocol Risk Level: Good Risk Score: 1 Electronic Signature(s) Signed: 12/20/2021 4:33:01 PM By: Carlene Coria RN Entered By: Carlene Coria on 12/18/2021 14:23:00
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
## 2021-12-20 NOTE — Progress Notes (Signed)
PAMLA, PANGLE ([id number]) Visit Report for 12/18/2021 Allergy List Details Patient Name: [name], [name]. Date of Service: 12/18/2021 2:00 PM Medical Record Number: [id number] Patient Account Number: [id number] Date of Birth/Sex: 04-23-1946 (76 y.o. F) Treating RN: Carlene Coria Primary Care Johan Antonacci: [name] Other Clinician: Referring Tafari Humiston: [name] Treating Del Overfelt/Extender: Yaakov Guthrie in Treatment: 0 Allergies Active Allergies atorvastatin Crestor ezetimibe amitriptyline Allergy Notes Electronic Signature(s) Signed: 12/20/2021 4:33:01 PM By: Carlene Coria RN Entered By: Carlene Coria on 12/18/2021 14:19:09 Mijangos, [name] ([id number]) -------------------------------------------------------------------------------- Arrival Information Details Patient Name: Dugdale, [name]. Date of Service: 12/18/2021 2:00 PM Medical Record Number: [id number] Patient Account Number: [id number] Date of Birth/Sex: June 08, 1946 (76 y.o. F) Treating RN: Carlene Coria Primary Care Shayden Gingrich: [name] Other Clinician: Referring Leith Szafranski: [name] Treating Walter Grima/Extender: Yaakov Guthrie in Treatment: 0 Visit Information Patient Arrived: Ambulatory Arrival Time: 13:22 Accompanied By: self Transfer Assistance: None Patient Identification Verified: Yes Secondary Verification Process Completed: Yes Patient Requires Transmission-Based Precautions: No Patient Has Alerts: No Electronic Signature(s) Signed: 12/20/2021 4:33:01 PM By: Carlene Coria RN Entered By: Carlene Coria on 12/18/2021 13:22:32 [place]ox, [name] ([id number]) -------------------------------------------------------------------------------- Clinic Level of Care Assessment Details Patient Name: Kempa, [name]. Date of Service: 12/18/2021 2:00 PM Medical Record Number: [id number] Patient Account Number: [id number] Date of Birth/Sex: [birth year] (76 y.o. F) Treating RN: Carlene Coria Primary Care Zyasia Halbleib: [name]  Other Clinician: Referring Ahmari Garton: [name] Treating Hoyle Barkdull/Extender: Yaakov Guthrie in Treatment: 0 Clinic Level of Care Assessment Items TOOL 1 Quantity Score X - Use when EandM and Procedure is performed on INITIAL visit 1 0 ASSESSMENTS - Nursing Assessment / Reassessment X - General Physical Exam (combine w/ comprehensive assessment (listed just below) when performed on new 1 20 pt. evals) X- 1 25 Comprehensive Assessment (HX, ROS, Risk Assessments, Wounds Hx, etc.) ASSESSMENTS - Wound and Skin Assessment / Reassessment []  - Dermatologic / Skin Assessment (not related to wound area) 0 ASSESSMENTS - Ostomy and/or Continence Assessment and Care []  - Incontinence Assessment and Management 0 []  - 0 Ostomy Care Assessment and Management (repouching, etc.) PROCESS - Coordination of Care X - Simple Patient / Family Education for ongoing care 1 15 []  - 0 Complex (extensive) Patient / Family Education for ongoing care X- 1 10 Staff obtains Programmer, systems, Records, Test Results / Process Orders []  - 0 Staff telephones HHA, Nursing Homes / Clarify orders / etc []  - 0 Routine Transfer to another Facility (non-emergent condition) []  - 0 Routine Hospital Admission (non-emergent condition) X- 1 15 New Admissions / Biomedical engineer / Ordering NPWT, Apligraf, etc. []  - 0 Emergency Hospital Admission (emergent condition) PROCESS - Special Needs []  - Pediatric / Minor Patient Management 0 []  - 0 Isolation Patient Management []  - 0 Hearing / Language / Visual special needs []  - 0 Assessment of Community assistance (transportation, D/C planning, etc.) []  - 0 Additional assistance / Altered mentation []  - 0 Support Surface(s) Assessment (bed, cushion, seat, etc.) INTERVENTIONS - Miscellaneous []  - External ear exam 0 []  - 0 Patient Transfer (multiple staff / Civil Service fast streamer / Similar devices) []  - 0 Simple Staple / Suture removal (25 or less) []  - 0 Complex Staple /  Suture removal (26 or more) []  - 0 Hypo/Hyperglycemic Management (do not check if billed separately) X- 1 15 Ankle / Brachial Index (ABI) - do not check if billed separately Has the patient been seen at the hospital within the last three years: Yes Total Score: 100 Level Of  Care: New/Established - Level 3 Stamp, LOUISA FAVARO ([id number]) Electronic Signature(s) Signed: 12/20/2021 4:33:01 PM By: Carlene Coria RN Entered By: Carlene Coria on 12/18/2021 15:19:25 [place], [name] ([id number]) -------------------------------------------------------------------------------- Encounter Discharge Information Details Patient Name: Mohrmann, [name]. Date of Service: 12/18/2021 2:00 PM Medical Record Number: [id number] Patient Account Number: [id number] Date of Birth/Sex: [birth year] (76 y.o. F) Treating RN: Carlene Coria Primary Care Seanmichael Salmons: [name] Other Clinician: Referring Camri Molloy: [name] Treating Jenesis Suchy/Extender: Yaakov Guthrie in Treatment: 0 Encounter Discharge Information Items Post Procedure Vitals Discharge Condition: Stable Temperature (F): 97.6 Ambulatory Status: Ambulatory Pulse (bpm): 93 Discharge Destination: Home Respiratory Rate (breaths/min): 18 Transportation: Private Auto Blood Pressure (mmHg): 107/64 Accompanied By: self Schedule Follow-up Appointment: Yes Clinical Summary of Care: Patient Declined Electronic Signature(s) Signed: 12/20/2021 4:33:01 PM By: Carlene Coria RN Entered By: Carlene Coria on 12/18/2021 15:21:30 Civil, [name] ([id number]) -------------------------------------------------------------------------------- Lower Extremity Assessment Details Patient Name: Petralia, [name]. Date of Service: 12/18/2021 2:00 PM Medical Record Number: [id number] Patient Account Number: [id number] Date of Birth/Sex: 1946-10-23 (77 y.o. F) Treating RN: Carlene Coria Primary Care Niva Murren: [name] Other Clinician: Referring Samarah Hogle: [name] Treating  Jesse Nosbisch/Extender: Yaakov Guthrie in Treatment: 0 Edema Assessment Assessed: [Left: No] [Right: No] Edema: [Left: Yes] [Right: Yes] Calf Left: Right: Point of Measurement: 30 cm From Medial Instep 41 cm 44 cm Ankle Left: Right: Point of Measurement: 10 cm From Medial Instep 27 cm 25 cm Knee To Floor Left: Right: From Medial Instep 42 cm 42 cm Vascular Assessment Pulses: Dorsalis Pedis Palpable: [Left:Yes] [Right:Yes] Doppler Audible: [Left:Yes] [Right:Yes] Blood Pressure: Brachial: [Left:107] [Right:107] Ankle: [Left:Dorsalis Pedis: 100 0.93] [Right:Dorsalis Pedis: 106 0.99] Electronic Signature(s) Signed: 12/20/2021 4:33:01 PM By: Carlene Coria RN Entered By: Carlene Coria on 12/18/2021 14:41:25 Santini, [name] ([id number]) -------------------------------------------------------------------------------- Multi Wound Chart Details Patient Name: Barrows, [name]. Date of Service: 12/18/2021 2:00 PM Medical Record Number: [id number] Patient Account Number: [id number] Date of Birth/Sex: 1946/02/04 (76 y.o. F) Treating RN: Carlene Coria Primary Care Katiana Ruland: [name] Other Clinician: Referring Setareh Rom: [name] Treating Dariyon Urquilla/Extender: Yaakov Guthrie in Treatment: 0 Vital Signs Height(in): Pulse(bpm): 107 Weight(lbs): Blood Pressure(mmHg): 141/80 Body Mass Index(BMI): Temperature(F): 98.3 Respiratory Rate(breaths/min): 18 Photos: Wound Location: Right, Lateral Lower Leg Right, Medial Lower Leg Left, Medial Lower Leg Wounding Event: Thermal Burn Thermal Burn Thermal Burn Primary Etiology: 3rd degree Burn 3rd degree Burn 3rd degree Burn Comorbid History: Hypertension Hypertension Hypertension Date Acquired: 11/27/2021 11/27/2021 11/27/2021 Weeks of Treatment: 0 0 0 Wound Status: Open Open Open Measurements L x W x D (cm) 4x3.2x0.1 6.5x4.5x0.1 6x4x0.1 Area (cm) : 10.053 22.973 18.85 Volume (cm) : 1.005 2.297 1.885 % Reduction in Area: N/A N/A  0.00% % Reduction in Volume: N/A N/A 0.00% Classification: Full Thickness Without Exposed Full Thickness Without Exposed Full Thickness Without Exposed Support Structures Support Structures Support Structures Exudate Amount: None Present None Present None Present Granulation Amount: None Present (0%) None Present (0%) None Present (0%) Necrotic Amount: Large (67-100%) Large (67-100%) Large (67-100%) Necrotic Tissue: Eschar Eschar Eschar Exposed Structures: Fascia: No Fascia: No Fascia: No Fat Layer (Subcutaneous Tissue): Fat Layer (Subcutaneous Tissue): Fat Layer (Subcutaneous Tissue): No No No Tendon: No Tendon: No Tendon: No Muscle: No Muscle: No Muscle: No Joint: No Joint: No Joint: No Bone: No Bone: No Bone: No Epithelialization: None None None Debridement: Chemical/Enzymatic/Mechanical - Chemical/Enzymatic/Mechanical - Chemical/Enzymatic/Mechanical - Selective/Open Wound Selective/Open Wound Selective/Open Wound Pre-procedure Verification/Time 15:00 15:00 15:00 Out Taken: Tissue Debrided: Necrotic/Eschar Necrotic/Eschar Necrotic/Eschar Level: Non-Viable Tissue  Non-Viable Tissue N/A Instrument: Other(saline and gauze) Other(saline and gauze) Other(saline and gauze) Bleeding: Minimum Minimum Minimum Hemostasis Achieved: Pressure Pressure Pressure Procedural Pain: 0 0 0 Post Procedural Pain: 0 0 0 Debridement Treatment Procedure was tolerated well Procedure was tolerated well Procedure was tolerated well Response: Post Debridement 4x3.2x0.1 6.5x4.5x0.1 6x4x0.1 Measurements L x W x D (cm) Post Debridement Volume: 1.005 2.297 1.885 (cm) [place], Sherran J. ([id number]) Procedures Performed: Debridement Debridement Debridement Wound Number: 4 N/A N/A Photos: N/A N/A Wound Location: Left, Lateral Lower Leg N/A N/A Wounding Event: Thermal Burn N/A N/A Primary Etiology: 3rd degree Burn N/A N/A Comorbid History: Hypertension N/A N/A Date Acquired: 11/27/2021 N/A N/A Weeks  of Treatment: 0 N/A N/A Wound Status: Open N/A N/A Measurements L x W x D (cm) 7x5x0.1 N/A N/A Area (cm) : 27.489 N/A N/A Volume (cm) : 2.749 N/A N/A % Reduction in Area: N/A N/A N/A % Reduction in Volume: N/A N/A N/A Classification: Full Thickness Without Exposed N/A N/A Support Structures Exudate Amount: None Present N/A N/A Granulation Amount: None Present (0%) N/A N/A Necrotic Amount: Large (67-100%) N/A N/A Necrotic Tissue: Eschar N/A N/A Exposed Structures: Fat Layer (Subcutaneous Tissue): N/A N/A Yes Fascia: No Tendon: No Muscle: No Joint: No Bone: No Epithelialization: None N/A N/A Debridement: N/A N/A N/A Tissue Debrided: N/A N/A N/A Level: N/A N/A N/A Instrument: N/A N/A N/A Bleeding: N/A N/A N/A Hemostasis Achieved: N/A N/A N/A Procedural Pain: N/A N/A N/A Post Procedural Pain: N/A N/A N/A Debridement Treatment N/A N/A N/A Response: Post Debridement N/A N/A N/A Measurements L x W x D (cm) Post Debridement Volume: N/A N/A N/A (cm) Procedures Performed: Burn Debridement: Small N/A N/A Treatment Notes Wound #1 (Lower Leg) Wound Laterality: Right, Lateral Cleanser Peri-Wound Care Topical Santyl Collagenase Ointment, 30 (gm), tube Discharge Instruction: apply nickel thick to wound bed only Primary Dressing Gauze Discharge Instruction: As directed: dry, moistened with saline or moistened with Dakins Solution Meece, [name] ([id number]) Secondary Dressing Kerlix 4.5 x 4.1 (in/yd) Discharge Instruction: Apply Kerlix 4.5 x 4.1 (in/yd) as instructed Secured With 35M Medipore H Soft Cloth Surgical Tape, 2x2 (in/yd) Compression Wrap Compression Stockings Add-Ons Wound #2 (Lower Leg) Wound Laterality: Right, Medial Cleanser Peri-Wound Care Topical Santyl Collagenase Ointment, 30 (gm), tube Discharge Instruction: apply nickel thick to wound bed only Primary Dressing Gauze Discharge Instruction: As directed: dry, moistened with saline or moistened with  Dakins Solution Secondary Dressing Kerlix 4.5 x 4.1 (in/yd) Discharge Instruction: Apply Kerlix 4.5 x 4.1 (in/yd) as instructed Secured With 35M Medipore H Soft Cloth Surgical Tape, 2x2 (in/yd) Compression Wrap Compression Stockings Add-Ons Wound #3 (Lower Leg) Wound Laterality: Left, Medial Cleanser Peri-Wound Care Topical Santyl Collagenase Ointment, 30 (gm), tube Discharge Instruction: apply nickel thick to wound bed only Primary Dressing Gauze Discharge Instruction: As directed: dry, moistened with saline or moistened with Dakins Solution Secondary Dressing Kerlix 4.5 x 4.1 (in/yd) Discharge Instruction: Apply Kerlix 4.5 x 4.1 (in/yd) as instructed Secured With 35M Medipore H Soft Cloth Surgical Tape, 2x2 (in/yd) Compression Wrap Compression Stockings Add-Ons Wound #4 (Lower Leg) Wound Laterality: Left, Lateral Schulte, Nyomie J. ([id number]) Cleanser Peri-Wound Care Topical Santyl Collagenase Ointment, 30 (gm), tube Discharge Instruction: apply nickel thick to wound bed only Primary Dressing Gauze Discharge Instruction: As directed: dry, moistened with saline or moistened with Dakins Solution Secondary Dressing Kerlix 4.5 x 4.1 (in/yd) Discharge Instruction: Apply Kerlix 4.5 x 4.1 (in/yd) as instructed Secured With 35M Medipore H Soft Cloth Surgical Tape, 2x2 (in/yd) Compression Wrap  Compression Stockings Add-Ons Electronic Signature(s) Signed: 12/18/2021 3:53:16 PM By: Kalman Shan DO Entered By: Kalman Shan on 12/18/2021 15:33:02 Brothers, [name] ([id number]) -------------------------------------------------------------------------------- Multi-Disciplinary Care Plan Details Patient Name: [name], Janice SKILTON. Date of Service: 12/18/2021 2:00 PM Medical Record Number: [id number] Patient Account Number: [id number] Date of Birth/Sex: 06-May-1946 (76 y.o. F) Treating RN: Carlene Coria Primary Care Janilah Hojnacki: [name] Other Clinician: Referring Faithlyn Recktenwald: [name] Treating Dilana Mcphie/Extender: Yaakov Guthrie in Treatment: 0 Active Inactive Wound/Skin Impairment Nursing Diagnoses: Knowledge deficit related to ulceration/compromised skin integrity Goals: Patient/caregiver will verbalize understanding of skin care regimen Date Initiated: 12/18/2021 Target Resolution Date: 01/18/2022 Goal Status: Active Ulcer/skin breakdown will have a volume reduction of 30% by week 4 Date Initiated: 12/18/2021 Target Resolution Date: 02/15/2022 Goal Status: Active Ulcer/skin breakdown will have a volume reduction of 50% by week 8 Date Initiated: 12/18/2021 Target Resolution Date: 03/18/2022 Goal Status: Active Ulcer/skin breakdown will have a volume reduction of 80% by week 12 Date Initiated: 12/18/2021 Target Resolution Date: 04/17/2022 Goal Status: Active Ulcer/skin breakdown will heal within 14 weeks Date Initiated: 12/18/2021 Target Resolution Date: 05/18/2022 Goal Status: Active Interventions: Assess patient/caregiver ability to obtain necessary supplies Assess patient/caregiver ability to perform ulcer/skin care regimen upon admission and as needed Assess ulceration(s) every visit Notes: Electronic Signature(s) Signed: 12/20/2021 4:33:01 PM By: Carlene Coria RN Entered By: Carlene Coria on 12/18/2021 15:05:33 Vanmeter, [name] ([id number]) -------------------------------------------------------------------------------- Pain Assessment Details Patient Name: Hagood, [name]. Date of Service: 12/18/2021 2:00 PM Medical Record Number: [id number] Patient Account Number: [id number] Date of Birth/Sex: 10/28/1946 (76 y.o. F) Treating RN: Carlene Coria Primary Care Phuc Kluttz: [name] Other Clinician: Referring Jaimee Corum: [name] Treating Kade Demicco/Extender: Yaakov Guthrie in Treatment: 0 Active Problems Location of Pain Severity and Description of Pain Patient Has Paino No Site Locations Pain Management and Medication Current Pain  Management: Electronic Signature(s) Signed: 12/20/2021 4:33:01 PM By: Carlene Coria RN Entered By: Carlene Coria on 12/18/2021 13:22:39 Pannone, [name] ([id number]) -------------------------------------------------------------------------------- Patient/Caregiver Education Details Patient Name: Neuroth, [name]. Date of Service: 12/18/2021 2:00 PM Medical Record Number: [id number] Patient Account Number: [id number] Date of Birth/Gender: 1946-10-16 (76 y.o. F) Treating RN: Carlene Coria Primary Care Physician: [name] Other Clinician: Referring Physician: [name] Treating Physician/Extender: Yaakov Guthrie in Treatment: 0 Education Assessment Education Provided To: Patient Education Topics Provided Wound/Skin Impairment: Methods: Explain/Verbal Responses: State content correctly Electronic Signature(s) Signed: 12/20/2021 4:33:01 PM By: Carlene Coria RN Entered By: Carlene Coria on 12/18/2021 15:19:46 Arterburn, [name] ([id number]) -------------------------------------------------------------------------------- Wound Assessment Details Patient Name: Mcauley, [name]. Date of Service: 12/18/2021 2:00 PM Medical Record Number: [id number] Patient Account Number: [id number] Date of Birth/Sex: [birth year] (76 y.o. F) Treating RN: Carlene Coria Primary Care Salimatou Simone: [name] Other Clinician: Referring Carrine Kroboth: [name] Treating Alvina Strother/Extender: Yaakov Guthrie in Treatment: 0 Wound Status Wound Number: 1 Primary Etiology: 3rd degree Burn Wound Location: Right, Lateral Lower Leg Wound Status: Open Wounding Event: Thermal Burn Comorbid History: Hypertension Date Acquired: 11/27/2021 Weeks Of Treatment: 0 Clustered Wound: No Photos Wound Measurements Length: (cm) 4 Width: (cm) 3.2 Depth: (cm) 0.1 Area: (cm) 10.053 Volume: (cm) 1.005 % Reduction in Area: % Reduction in Volume: Epithelialization: None Tunneling: No Undermining: No Wound  Description Classification: Full Thickness Without Exposed Support Structure Exudate Amount: None Present s Foul Odor After Cleansing: No Slough/Fibrino Yes Wound Bed Granulation Amount: None Present (0%) Exposed Structure Necrotic Amount: Large (67-100%) Fascia Exposed: No Necrotic Quality: Eschar Fat Layer (Subcutaneous Tissue) Exposed: No Tendon Exposed:  No Muscle Exposed: No Joint Exposed: No Bone Exposed: No Treatment Notes Wound #1 (Lower Leg) Wound Laterality: Right, Lateral Cleanser Peri-Wound Care Topical Santyl Collagenase Ointment, 30 (gm), tube Discharge Instruction: apply nickel thick to wound bed only Bazzi, Joniya J. ([id number]) Primary Dressing Gauze Discharge Instruction: As directed: dry, moistened with saline or moistened with Dakins Solution Secondary Dressing Kerlix 4.5 x 4.1 (in/yd) Discharge Instruction: Apply Kerlix 4.5 x 4.1 (in/yd) as instructed Secured With 35M Medipore H Soft Cloth Surgical Tape, 2x2 (in/yd) Compression Wrap Compression Stockings Add-Ons Electronic Signature(s) Signed: 12/20/2021 4:33:01 PM By: Carlene Coria RN Entered By: Carlene Coria on 12/18/2021 14:35:23 Gottsch, [name] ([id number]) -------------------------------------------------------------------------------- Wound Assessment Details Patient Name: Voeltz, [name]. Date of Service: 12/18/2021 2:00 PM Medical Record Number: [id number] Patient Account Number: [id number] Date of Birth/Sex: Nov 14, 1946 (76 y.o. F) Treating RN: Carlene Coria Primary Care Keisy Strickler: [name] Other Clinician: Referring Noelly Lasseigne: [name] Treating Sahaana Weitman/Extender: Yaakov Guthrie in Treatment: 0 Wound Status Wound Number: 2 Primary Etiology: 3rd degree Burn Wound Location: Right, Medial Lower Leg Wound Status: Open Wounding Event: Thermal Burn Comorbid History: Hypertension Date Acquired: 11/27/2021 Weeks Of Treatment: 0 Clustered Wound: No Photos Wound Measurements Length: (cm)  6.5 Width: (cm) 4.5 Depth: (cm) 0.1 Area: (cm) 22.973 Volume: (cm) 2.297 % Reduction in Area: % Reduction in Volume: Epithelialization: None Tunneling: No Undermining: No Wound Description Classification: Full Thickness Without Exposed Support Structure Exudate Amount: None Present s Foul Odor After Cleansing: No Slough/Fibrino Yes Wound Bed Granulation Amount: None Present (0%) Exposed Structure Necrotic Amount: Large (67-100%) Fascia Exposed: No Necrotic Quality: Eschar Fat Layer (Subcutaneous Tissue) Exposed: No Tendon Exposed: No Muscle Exposed: No Joint Exposed: No Bone Exposed: No Treatment Notes Wound #2 (Lower Leg) Wound Laterality: Right, Medial Cleanser Peri-Wound Care Topical Santyl Collagenase Ointment, 30 (gm), tube Discharge Instruction: apply nickel thick to wound bed only Casanas, Noora J. ([id number]) Primary Dressing Gauze Discharge Instruction: As directed: dry, moistened with saline or moistened with Dakins Solution Secondary Dressing Kerlix 4.5 x 4.1 (in/yd) Discharge Instruction: Apply Kerlix 4.5 x 4.1 (in/yd) as instructed Secured With 35M Medipore H Soft Cloth Surgical Tape, 2x2 (in/yd) Compression Wrap Compression Stockings Add-Ons Electronic Signature(s) Signed: 12/20/2021 4:33:01 PM By: Carlene Coria RN Entered By: Carlene Coria on 12/18/2021 14:36:44 Hunzeker, [name] ([id number]) -------------------------------------------------------------------------------- Wound Assessment Details Patient Name: Pruett, [name]. Date of Service: 12/18/2021 2:00 PM Medical Record Number: [id number] Patient Account Number: [id number] Date of Birth/Sex: [birth year] (76 y.o. F) Treating RN: Carlene Coria Primary Care Zenith Lamphier: [name] Other Clinician: Referring Nydia Ytuarte: [name] Treating Marijane Trower/Extender: Yaakov Guthrie in Treatment: 0 Wound Status Wound Number: 3 Primary Etiology: 3rd degree Burn Wound Location: Left, Medial Lower Leg Wound  Status: Open Wounding Event: Thermal Burn Comorbid History: Hypertension Date Acquired: 11/27/2021 Weeks Of Treatment: 0 Clustered Wound: No Photos Wound Measurements Length: (cm) 6 Width: (cm) 4 Depth: (cm) 0.1 Area: (cm) 18.85 Volume: (cm) 1.885 % Reduction in Area: 0% % Reduction in Volume: 0% Epithelialization: None Tunneling: No Undermining: No Wound Description Classification: Full Thickness Without Exposed Support Structure Exudate Amount: None Present s Foul Odor After Cleansing: No Slough/Fibrino Yes Wound Bed Granulation Amount: None Present (0%) Exposed Structure Necrotic Amount: Large (67-100%) Fascia Exposed: No Necrotic Quality: Eschar Fat Layer (Subcutaneous Tissue) Exposed: No Tendon Exposed: No Muscle Exposed: No Joint Exposed: No Bone Exposed: No Treatment Notes Wound #3 (Lower Leg) Wound Laterality: Left, Medial Cleanser Peri-Wound Care Topical Santyl Collagenase Ointment, 30 (gm), tube Discharge Instruction:  apply nickel thick to wound bed only Fouche, Yuriana J. ([id number]) Primary Dressing Gauze Discharge Instruction: As directed: dry, moistened with saline or moistened with Dakins Solution Secondary Dressing Kerlix 4.5 x 4.1 (in/yd) Discharge Instruction: Apply Kerlix 4.5 x 4.1 (in/yd) as instructed Secured With 250M Medipore H Soft Cloth Surgical Tape, 2x2 (in/yd) Compression Wrap Compression Stockings Add-Ons Electronic Signature(s) Signed: 12/20/2021 4:33:01 PM By: Carlene Coria RN Entered By: Carlene Coria on 12/18/2021 14:38:39 Binz, [name] ([id number]) -------------------------------------------------------------------------------- Wound Assessment Details Patient Name: Canniff, [name]. Date of Service: 12/18/2021 2:00 PM Medical Record Number: [id number] Patient Account Number: [id number] Date of Birth/Sex: 21-Feb-1946 (76 y.o. F) Treating RN: Carlene Coria Primary Care Amber Williard: [name] Other Clinician: Referring Jimie Kuwahara: [name] Treating Avelyn Touch/Extender: Yaakov Guthrie in Treatment: 0 Wound Status Wound Number: 4 Primary Etiology: 3rd degree Burn Wound Location: Left, Lateral Lower Leg Wound Status: Open Wounding Event: Thermal Burn Comorbid History: Hypertension Date Acquired: 11/27/2021 Weeks Of Treatment: 0 Clustered Wound: No Photos Wound Measurements Length: (cm) 7 Width: (cm) 5 Depth: (cm) 0.1 Area: (cm) 27.489 Volume: (cm) 2.749 % Reduction in Area: % Reduction in Volume: Epithelialization: None Tunneling: No Undermining: No Wound Description Classification: Full Thickness Without Exposed Support Structure Exudate Amount: None Present s Foul Odor After Cleansing: No Slough/Fibrino Yes Wound Bed Granulation Amount: None Present (0%) Exposed Structure Necrotic Amount: Large (67-100%) Fascia Exposed: No Necrotic Quality: Eschar Fat Layer (Subcutaneous Tissue) Exposed: Yes Tendon Exposed: No Muscle Exposed: No Joint Exposed: No Bone Exposed: No Treatment Notes Wound #4 (Lower Leg) Wound Laterality: Left, Lateral Cleanser Peri-Wound Care Topical Santyl Collagenase Ointment, 30 (gm), tube Discharge Instruction: apply nickel thick to wound bed only Dooly, Danija J. ([id number]) Primary Dressing Gauze Discharge Instruction: As directed: dry, moistened with saline or moistened with Dakins Solution Secondary Dressing Kerlix 4.5 x 4.1 (in/yd) Discharge Instruction: Apply Kerlix 4.5 x 4.1 (in/yd) as instructed Secured With 250M Medipore H Soft Cloth Surgical Tape, 2x2 (in/yd) Compression Wrap Compression Stockings Add-Ons Electronic Signature(s) Signed: 12/20/2021 4:33:01 PM By: Carlene Coria RN Entered By: Carlene Coria on 12/18/2021 14:39:54 Tullo, [name] ([id number]) -------------------------------------------------------------------------------- Vitals Details Patient Name: Woldt, [name]. Date of Service: 12/18/2021 2:00 PM Medical Record Number: [id number] Patient  Account Number: [id number] Date of Birth/Sex: August 17, 1946 (76 y.o. F) Treating RN: Carlene Coria Primary Care Bathsheba Durrett: [name] Other Clinician: Referring Kenney Going: [name] Treating Glenda Spelman/Extender: Yaakov Guthrie in Treatment: 0 Vital Signs Time Taken: 13:22 Temperature (F): 98.3 Pulse (bpm): 59 Respiratory Rate (breaths/min): 18 Blood Pressure (mmHg): 141/80 Reference Range: 80 - 120 mg / dl Electronic Signature(s) Signed: 12/20/2021 4:33:01 PM By: Carlene Coria RN Entered By: Carlene Coria on 12/18/2021 13:22:55

## 2021-12-25 ENCOUNTER — Encounter (HOSPITAL_BASED_OUTPATIENT_CLINIC_OR_DEPARTMENT_OTHER): Payer: Medicare Other | Admitting: Internal Medicine

## 2021-12-25 ENCOUNTER — Other Ambulatory Visit: Payer: Self-pay

## 2021-12-25 DIAGNOSIS — G4733 Obstructive sleep apnea (adult) (pediatric): Secondary | ICD-10-CM | POA: Diagnosis not present

## 2021-12-25 DIAGNOSIS — T24031A Burn of unspecified degree of right lower leg, initial encounter: Secondary | ICD-10-CM

## 2021-12-25 DIAGNOSIS — T24032A Burn of unspecified degree of left lower leg, initial encounter: Secondary | ICD-10-CM

## 2021-12-26 NOTE — Progress Notes (Addendum)
SHERRYANN, FRESE ([id number]) Visit Report for 12/25/2021 Arrival Information Details Patient Name: [name], [name]. Date of Service: 12/25/2021 3:30 PM Medical Record Number: [id number] Patient Account Number: [id number] Date of Birth/Sex: 11/21/1946 (76 y.o. F) Treating RN: Carlene Coria Primary Care Payden Docter: [name] Other Clinician: Referring Preslee Regas: [name] Treating Yadir Zentner/Extender: Yaakov Guthrie in Treatment: 1 Visit Information History Since Last Visit All ordered tests and consults were completed: No Patient Arrived: Ambulatory Added or deleted any medications: No Arrival Time: 15:28 Any new allergies or adverse reactions: No Accompanied By: self Had a fall or experienced change in No Transfer Assistance: None activities of daily living that may affect Patient Identification Verified: Yes risk of falls: Secondary Verification Process Completed: Yes Signs or symptoms of abuse/neglect since last visito No Patient Requires Transmission-Based Precautions: No Hospitalized since last visit: No Patient Has Alerts: No Implantable device outside of the clinic excluding No cellular tissue based products placed in the center since last visit: Has Dressing in Place as Prescribed: Yes Pain Present Now: Yes Electronic Signature(s) Signed: 12/25/2021 6:47:24 PM By: Carlene Coria RN Entered By: Carlene Coria on 12/25/2021 15:34:54 Coe, Maryann [name] ([id number]) -------------------------------------------------------------------------------- Clinic Level of Care Assessment Details Patient Name: [name]. Date of Service: 12/25/2021 3:30 PM Medical Record Number: [id number] Patient Account Number: [id number] Date of Birth/Sex: June 11, 1946 (76 y.o. F) Treating RN: Carlene Coria Primary Care Salvatrice Morandi: [name] Other Clinician: Referring Maor Meckel: [name] Treating Michelangelo Rindfleisch/Extender: Yaakov Guthrie in Treatment: 1 Clinic Level of Care Assessment Items TOOL  1 Quantity Score []  - Use when EandM and Procedure is performed on INITIAL visit 0 ASSESSMENTS - Nursing Assessment / Reassessment []  - General Physical Exam (combine w/ comprehensive assessment (listed just below) when performed on new 0 pt. evals) []  - 0 Comprehensive Assessment (HX, ROS, Risk Assessments, Wounds Hx, etc.) ASSESSMENTS - Wound and Skin Assessment / Reassessment []  - Dermatologic / Skin Assessment (not related to wound area) 0 ASSESSMENTS - Ostomy and/or Continence Assessment and Care []  - Incontinence Assessment and Management 0 []  - 0 Ostomy Care Assessment and Management (repouching, etc.) PROCESS - Coordination of Care []  - Simple Patient / Family Education for ongoing care 0 []  - 0 Complex (extensive) Patient / Family Education for ongoing care []  - 0 Staff obtains Programmer, systems, Records, Test Results / Process Orders []  - 0 Staff telephones HHA, Nursing Homes / Clarify orders / etc []  - 0 Routine Transfer to another Facility (non-emergent condition) []  - 0 Routine Hospital Admission (non-emergent condition) []  - 0 New Admissions / Biomedical engineer / Ordering NPWT, Apligraf, etc. []  - 0 Emergency Hospital Admission (emergent condition) PROCESS - Special Needs []  - Pediatric / Minor Patient Management 0 []  - 0 Isolation Patient Management []  - 0 Hearing / Language / Visual special needs []  - 0 Assessment of Community assistance (transportation, D/C planning, etc.) []  - 0 Additional assistance / Altered mentation []  - 0 Support Surface(s) Assessment (bed, cushion, seat, etc.) INTERVENTIONS - Miscellaneous []  - External ear exam 0 []  - 0 Patient Transfer (multiple staff / Civil Service fast streamer / Similar devices) []  - 0 Simple Staple / Suture removal (25 or less) []  - 0 Complex Staple / Suture removal (26 or more) []  - 0 Hypo/Hyperglycemic Management (do not check if billed separately) []  - 0 Ankle / Brachial Index (ABI) - do not check if billed  separately Has the patient been seen at the hospital within the last three years: Yes Total Score: 0 Level  Of Care: ____ [name] ([id number]) Electronic Signature(s) Signed: 01/07/2022 1:59:58 PM By: Carlene Coria RN Entered By: Carlene Coria on 01/03/2022 10:59:47 Ende, Maryann [name] ([id number]) -------------------------------------------------------------------------------- Lower Extremity Assessment Details Patient Name: [name]. Date of Service: 12/25/2021 3:30 PM Medical Record Number: [id number] Patient Account Number: [id number] Date of Birth/Sex: 1946/05/05 (76 y.o. F) Treating RN: Carlene Coria Primary Care Zephyr Sausedo: [name] Other Clinician: Referring Oriana Horiuchi: [name] Treating Ugochi Henzler/Extender: Yaakov Guthrie in Treatment: 1 Edema Assessment Assessed: [Left: No] [Right: No] Edema: [Left: Yes] [Right: Yes] Calf Left: Right: Point of Measurement: 30 cm From Medial Instep 44 cm 42 cm Ankle Left: Right: Point of Measurement: 10 cm From Medial Instep 27 cm 24 cm Knee To Floor Left: Right: From Medial Instep 42 cm 42 cm Vascular Assessment Pulses: Dorsalis Pedis Palpable: [Left:Yes] [Right:Yes] Electronic Signature(s) Signed: 12/25/2021 6:47:24 PM By: Carlene Coria RN Entered By: Carlene Coria on 12/25/2021 15:50:12 Friedlander, Maryann [name] ([id number]) -------------------------------------------------------------------------------- Multi Wound Chart Details Patient Name: [name]. Date of Service: 12/25/2021 3:30 PM Medical Record Number: [id number] Patient Account Number: [id number] Date of Birth/Sex: [birth year] (76 y.o. F) Treating RN: Carlene Coria Primary Care Shia Delaine: [name] Other Clinician: Referring Inanna Telford: [name] Treating Shirrell Solinger/Extender: Yaakov Guthrie in Treatment: 1 Vital Signs Height(in): Pulse(bpm): 48 Weight(lbs): Blood Pressure(mmHg): 118/70 Body Mass Index(BMI): Temperature(F): 97.6 Respiratory  Rate(breaths/min): 18 Photos: Wound Location: Right, Lateral Lower Leg Right, Medial Lower Leg Left, Medial Lower Leg Wounding Event: Thermal Burn Thermal Burn Thermal Burn Primary Etiology: 3rd degree Burn 3rd degree Burn 3rd degree Burn Comorbid History: Hypertension Hypertension Hypertension Date Acquired: 11/27/2021 11/27/2021 11/27/2021 Weeks of Treatment: 1 1 1  Wound Status: Open Open Open Measurements L x W x D (cm) 5x3.7x0.1 6.5x5.2x0.1 6.5x4x0.1 Area (cm) : 14.53 26.546 20.42 Volume (cm) : 1.453 2.655 2.042 % Reduction in Area: -44.50% -15.60% -8.30% % Reduction in Volume: -44.60% -15.60% -8.30% Classification: Full Thickness Without Exposed Full Thickness Without Exposed Full Thickness Without Exposed Support Structures Support Structures Support Structures Exudate Amount: None Present None Present None Present Granulation Amount: None Present (0%) None Present (0%) None Present (0%) Necrotic Amount: Large (67-100%) Large (67-100%) Large (67-100%) Necrotic Tissue: Eschar Eschar Eschar Exposed Structures: Fascia: No Fascia: No Fascia: No Fat Layer (Subcutaneous Tissue): Fat Layer (Subcutaneous Tissue): Fat Layer (Subcutaneous Tissue): No No No Tendon: No Tendon: No Tendon: No Muscle: No Muscle: No Muscle: No Joint: No Joint: No Joint: No Bone: No Bone: No Bone: No Epithelialization: None None None Wound Number: 4 N/A N/A Photos: N/A N/A Wound Location: Left, Lateral Lower Leg N/A N/A Wounding Event: Thermal Burn N/A N/A Primary Etiology: 3rd degree Burn N/A N/A Comorbid History: Hypertension N/A N/A SHAIMA, SARDINAS ([id number]) Date Acquired: 11/27/2021 N/A N/A Weeks of Treatment: 1 N/A N/A Wound Status: Open N/A N/A Measurements L x W x D (cm) 7.5x6x0.1 N/A N/A Area (cm) : 35.343 N/A N/A Volume (cm) : 3.534 N/A N/A % Reduction in Area: -28.60% N/A N/A % Reduction in Volume: -28.60% N/A N/A Classification: Full Thickness Without Exposed N/A  N/A Support Structures Exudate Amount: None Present N/A N/A Granulation Amount: None Present (0%) N/A N/A Necrotic Amount: Large (67-100%) N/A N/A Necrotic Tissue: Eschar N/A N/A Exposed Structures: Fat Layer (Subcutaneous Tissue): N/A N/A Yes Fascia: No Tendon: No Muscle: No Joint: No Bone: No Epithelialization: None N/A N/A Treatment Notes Electronic Signature(s) Signed: 12/25/2021 6:47:24 PM By: Carlene Coria RN Entered By: Carlene Coria on 12/25/2021 15:58:30 Gabay, Maryann [name] ([id number]) --------------------------------------------------------------------------------  Multi-Disciplinary Care Plan Details Patient Name: Potenza, STARIA BIRKHEAD. Date of Service: 12/25/2021 3:30 PM Medical Record Number: [id number] Patient Account Number: [id number] Date of Birth/Sex: Aug 20, 1946 (76 y.o. F) Treating RN: Carlene Coria Primary Care Jaquasha Carnevale: [name] Other Clinician: Referring Shekia Kuper: [name] Treating Donal Lynam/Extender: Yaakov Guthrie in Treatment: 1 Active Inactive Wound/Skin Impairment Nursing Diagnoses: Knowledge deficit related to ulceration/compromised skin integrity Goals: Patient/caregiver will verbalize understanding of skin care regimen Date Initiated: 12/18/2021 Target Resolution Date: 01/18/2022 Goal Status: Active Ulcer/skin breakdown will have a volume reduction of 30% by week 4 Date Initiated: 12/18/2021 Target Resolution Date: 02/15/2022 Goal Status: Active Ulcer/skin breakdown will have a volume reduction of 50% by week 8 Date Initiated: 12/18/2021 Target Resolution Date: 03/18/2022 Goal Status: Active Ulcer/skin breakdown will have a volume reduction of 80% by week 12 Date Initiated: 12/18/2021 Target Resolution Date: 04/17/2022 Goal Status: Active Ulcer/skin breakdown will heal within 14 weeks Date Initiated: 12/18/2021 Target Resolution Date: 05/18/2022 Goal Status: Active Interventions: Assess patient/caregiver ability to obtain necessary supplies Assess  patient/caregiver ability to perform ulcer/skin care regimen upon admission and as needed Assess ulceration(s) every visit Notes: Electronic Signature(s) Signed: 12/25/2021 6:47:24 PM By: Carlene Coria RN Entered By: Carlene Coria on 12/25/2021 15:55:31 Kalka, Maryann [name] ([id number]) -------------------------------------------------------------------------------- Pain Assessment Details Patient Name: Gregson, Maryann [name]. Date of Service: 12/25/2021 3:30 PM Medical Record Number: [id number] Patient Account Number: [id number] Date of Birth/Sex: [birth year] (76 y.o. F) Treating RN: Carlene Coria Primary Care Mandolin Falwell: [name] Other Clinician: Referring Abisai Deer: [name] Treating Noelle Hoogland/Extender: Yaakov Guthrie in Treatment: 1 Active Problems Location of Pain Severity and Description of Pain Patient Has Paino Yes Site Locations With Dressing Change: Yes Duration of the Pain. Constant / Intermittento Intermittent How Long Does it Lasto Hours: Minutes: 15 Rate the pain. Current Pain Level: 2 Worst Pain Level: 3 Least Pain Level: 0 Tolerable Pain Level: 5 Character of Pain Describe the Pain: Aching, Burning Pain Management and Medication Current Pain Management: Medication: Yes Cold Application: No Rest: Yes Massage: No Activity: No T.E.N.S.: No Heat Application: No Leg drop or elevation: No Is the Current Pain Management Adequate: Inadequate How does your wound impact your activities of daily livingo Sleep: No Bathing: No Appetite: No Relationship With Others: No Bladder Continence: No Emotions: No Bowel Continence: No Work: No Toileting: No Drive: No Dressing: No Hobbies: No Electronic Signature(s) Signed: 12/25/2021 6:47:24 PM By: Carlene Coria RN Entered By: Carlene Coria on 12/25/2021 15:36:25 Mancusi, Maryann [name] ([id number]) -------------------------------------------------------------------------------- Patient/Caregiver Education Details Patient Name: [name] Date of Service: 12/25/2021 3:30 PM Medical Record Number: [id number] Patient Account Number: [id number] Date of Birth/Gender: 1946-10-04 (76 y.o. F) Treating RN: Carlene Coria Primary Care Physician: [name] Other Clinician: Referring Physician: [name] Treating Physician/Extender: Yaakov Guthrie in Treatment: 1 Education Assessment Education Provided To: Patient Education Topics Provided Wound/Skin Impairment: Methods: Explain/Verbal Responses: State content correctly Electronic Signature(s) Signed: 01/15/2022 1:17:13 PM By: Carlene Coria RN Entered By: Carlene Coria on 01/15/2022 11:19:17 Weismann, Maryann [name] ([id number]) -------------------------------------------------------------------------------- Wound Assessment Details Patient Name: Lecompte, Maryann [name]. Date of Service: 12/25/2021 3:30 PM Medical Record Number: [id number] Patient Account Number: [id number] Date of Birth/Sex: 03-22-1946 (76 y.o. F) Treating RN: Carlene Coria Primary Care Hindy Perrault: [name] Other Clinician: Referring Abrahm Mancia: [name] Treating Elisha Cooksey/Extender: Yaakov Guthrie in Treatment: 1 Wound Status Wound Number: 1 Primary Etiology: 3rd degree Burn Wound Location: Right, Lateral Lower Leg Wound Status: Open Wounding Event: Thermal Burn Comorbid History: Hypertension Date Acquired: 11/27/2021  Weeks Of Treatment: 1 Clustered Wound: No Photos Wound Measurements Length: (cm) 5 Width: (cm) 3.7 Depth: (cm) 0.1 Area: (cm) 14.53 Volume: (cm) 1.453 % Reduction in Area: -44.5% % Reduction in Volume: -44.6% Epithelialization: None Tunneling: No Undermining: No Wound Description Classification: Full Thickness Without Exposed Support Structure Exudate Amount: None Present s Foul Odor After Cleansing: No Slough/Fibrino Yes Wound Bed Granulation Amount: None Present (0%) Exposed Structure Necrotic Amount: Large (67-100%) Fascia Exposed: No Necrotic Quality:  Eschar Fat Layer (Subcutaneous Tissue) Exposed: No Tendon Exposed: No Muscle Exposed: No Joint Exposed: No Bone Exposed: No Electronic Signature(s) Signed: 12/25/2021 6:47:24 PM By: Carlene Coria RN Entered By: Carlene Coria on 12/25/2021 15:43:07 Dittrich, Maryann [name] ([id number]) -------------------------------------------------------------------------------- Wound Assessment Details Patient Name: Delpizzo, Maryann [name]. Date of Service: 12/25/2021 3:30 PM Medical Record Number: [id number] Patient Account Number: [id number] Date of Birth/Sex: 27-Sep-1946 (76 y.o. F) Treating RN: Carlene Coria Primary Care Nastacia Raybuck: [name] Other Clinician: Referring Harshaan Whang: [name] Treating Cletus Mehlhoff/Extender: Yaakov Guthrie in Treatment: 1 Wound Status Wound Number: 2 Primary Etiology: 3rd degree Burn Wound Location: Right, Medial Lower Leg Wound Status: Open Wounding Event: Thermal Burn Comorbid History: Hypertension Date Acquired: 11/27/2021 Weeks Of Treatment: 1 Clustered Wound: No Photos Wound Measurements Length: (cm) 6.5 Width: (cm) 5.2 Depth: (cm) 0.1 Area: (cm) 26.546 Volume: (cm) 2.655 % Reduction in Area: -15.6% % Reduction in Volume: -15.6% Epithelialization: None Tunneling: No Undermining: No Wound Description Classification: Full Thickness Without Exposed Support Structure Exudate Amount: None Present s Foul Odor After Cleansing: No Slough/Fibrino Yes Wound Bed Granulation Amount: None Present (0%) Exposed Structure Necrotic Amount: Large (67-100%) Fascia Exposed: No Necrotic Quality: Eschar Fat Layer (Subcutaneous Tissue) Exposed: No Tendon Exposed: No Muscle Exposed: No Joint Exposed: No Bone Exposed: No Electronic Signature(s) Signed: 12/25/2021 6:47:24 PM By: Carlene Coria RN Entered By: Carlene Coria on 12/25/2021 15:44:05 Cimo, Maryann [name] ([id number]) -------------------------------------------------------------------------------- Wound Assessment  Details Patient Name: Freet, Maryann [name]. Date of Service: 12/25/2021 3:30 PM Medical Record Number: [id number] Patient Account Number: [id number] Date of Birth/Sex: [birth year] (75 y.o. F) Treating RN: Carlene Coria Primary Care Sharise Lippy: [name] Other Clinician: Referring Kairy Folsom: [name] Treating Janari Gagner/Extender: Yaakov Guthrie in Treatment: 1 Wound Status Wound Number: 3 Primary Etiology: 3rd degree Burn Wound Location: Left, Medial Lower Leg Wound Status: Open Wounding Event: Thermal Burn Comorbid History: Hypertension Date Acquired: 11/27/2021 Weeks Of Treatment: 1 Clustered Wound: No Photos Wound Measurements Length: (cm) 6.5 Width: (cm) 4 Depth: (cm) 0.1 Area: (cm) 20.42 Volume: (cm) 2.042 % Reduction in Area: -8.3% % Reduction in Volume: -8.3% Epithelialization: None Tunneling: No Undermining: No Wound Description Classification: Full Thickness Without Exposed Support Structure Exudate Amount: None Present s Foul Odor After Cleansing: No Slough/Fibrino Yes Wound Bed Granulation Amount: None Present (0%) Exposed Structure Necrotic Amount: Large (67-100%) Fascia Exposed: No Necrotic Quality: Eschar Fat Layer (Subcutaneous Tissue) Exposed: No Tendon Exposed: No Muscle Exposed: No Joint Exposed: No Bone Exposed: No Electronic Signature(s) Signed: 12/25/2021 6:47:24 PM By: Carlene Coria RN Entered By: Carlene Coria on 12/25/2021 15:44:35 Wallis, Maryann [name] ([id number]) -------------------------------------------------------------------------------- Wound Assessment Details Patient Name: Betke, Maryann [name]. Date of Service: 12/25/2021 3:30 PM Medical Record Number: [id number] Patient Account Number: [id number] Date of Birth/Sex: 1945/12/18 (76 y.o. F) Treating RN: Carlene Coria Primary Care Righteous Claiborne: [name] Other Clinician: Referring Garrette Caine: [name] Treating Zineb Glade/Extender: Yaakov Guthrie in Treatment: 1 Wound Status Wound  Number: 4 Primary Etiology: 3rd degree Burn Wound Location: Left, Lateral Lower Leg Wound Status: Open Wounding  Event: Thermal Burn Comorbid History: Hypertension Date Acquired: 11/27/2021 Weeks Of Treatment: 1 Clustered Wound: No Photos Wound Measurements Length: (cm) 7.5 Width: (cm) 6 Depth: (cm) 0.1 Area: (cm) 35.343 Volume: (cm) 3.534 % Reduction in Area: -28.6% % Reduction in Volume: -28.6% Epithelialization: None Tunneling: No Undermining: No Wound Description Classification: Full Thickness Without Exposed Support Structures Exudate Amount: None Present Foul Odor After Cleansing: No Slough/Fibrino Yes Wound Bed Granulation Amount: None Present (0%) Exposed Structure Necrotic Amount: Large (67-100%) Fascia Exposed: No Necrotic Quality: Eschar Fat Layer (Subcutaneous Tissue) Exposed: Yes Tendon Exposed: No Muscle Exposed: No Joint Exposed: No Bone Exposed: No Electronic Signature(s) Signed: 12/25/2021 6:47:24 PM By: Carlene Coria RN Entered By: Carlene Coria on 12/25/2021 15:47:26 Shuler, Maryann [name] ([id number]) -------------------------------------------------------------------------------- [place] Details Patient Name: Dumm, Maryann [name]. Date of Service: 12/25/2021 3:30 PM Medical Record Number: [id number] Patient Account Number: [id number] Date of Birth/Sex: 1946/10/06 (76 y.o. F) Treating RN: Carlene Coria Primary Care Vansh Reckart: [name] Other Clinician: Referring Tacey Dimaggio: [name] Treating Fara Worthy/Extender: Yaakov Guthrie in Treatment: 1 Vital Signs Time Taken: 15:33 Temperature (F): 97.6 Pulse (bpm): 69 Respiratory Rate (breaths/min): 18 Blood Pressure (mmHg): 118/70 Reference Range: 80 - 120 mg / dl Electronic Signature(s) Signed: 12/25/2021 6:47:24 PM By: Carlene Coria RN Entered By: Carlene Coria on 12/25/2021 15:35:10
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
## 2021-12-30 DIAGNOSIS — E538 Deficiency of other specified B group vitamins: Secondary | ICD-10-CM | POA: Diagnosis not present

## 2021-12-30 DIAGNOSIS — E7800 Pure hypercholesterolemia, unspecified: Secondary | ICD-10-CM | POA: Diagnosis not present

## 2021-12-30 DIAGNOSIS — I10 Essential (primary) hypertension: Secondary | ICD-10-CM | POA: Diagnosis not present

## 2021-12-30 DIAGNOSIS — E790 Hyperuricemia without signs of inflammatory arthritis and tophaceous disease: Secondary | ICD-10-CM | POA: Diagnosis not present

## 2022-01-01 ENCOUNTER — Encounter (HOSPITAL_BASED_OUTPATIENT_CLINIC_OR_DEPARTMENT_OTHER): Payer: Medicare Other | Admitting: Internal Medicine

## 2022-01-01 ENCOUNTER — Other Ambulatory Visit: Payer: Self-pay

## 2022-01-01 DIAGNOSIS — T24031A Burn of unspecified degree of right lower leg, initial encounter: Secondary | ICD-10-CM

## 2022-01-01 DIAGNOSIS — T24032A Burn of unspecified degree of left lower leg, initial encounter: Secondary | ICD-10-CM | POA: Diagnosis not present

## 2022-01-01 DIAGNOSIS — G4733 Obstructive sleep apnea (adult) (pediatric): Secondary | ICD-10-CM | POA: Diagnosis not present
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
## 2022-01-01 NOTE — Progress Notes (Signed)
TEHILLAH, CIPRIANI ([id number]) Visit Report for 01/01/2022 Arrival Information Details Patient Name: [name], [name]. Date of Service: 01/01/2022 2:00 PM Medical Record Number: [id number] Patient Account Number: [id number] Date of Birth/Sex: 07-14-1946 (76 y.o. F) Treating RN: [name] Primary Care [name]: [name] Other Clinician: Referring [name]: [name] [name]/Extender: [name] in Treatment: 2 Visit Information History Since Last Visit All ordered tests and consults were completed: No Patient Arrived: Ambulatory Added or deleted any medications: No Arrival Time: 14:06 Any new allergies or adverse reactions: No Accompanied By: husband Had a fall or experienced change in No Transfer Assistance: None activities of daily living that may affect Patient Identification Verified: Yes risk of falls: Secondary Verification Process Completed: Yes Signs or symptoms of abuse/neglect since last visito No Patient Requires Transmission-Based Precautions: No Hospitalized since last visit: No Patient Has Alerts: No Implantable device outside of the clinic excluding No cellular tissue based products placed in the center since last visit: Has Dressing in Place as Prescribed: Yes Pain Present Now: No Electronic Signature(s) Signed: 01/01/2022 5:00:43 PM By: Janice Coria RN Entered By: [name] on 01/01/2022 14:10:41 [name] ([id number]) -------------------------------------------------------------------------------- Clinic Level of Care Assessment Details Patient Name: Janice [name]. Date of Service: 01/01/2022 2:00 PM Medical Record Number: [id number] Patient Account Number: [id number] Date of Birth/Sex: 05/11/1946 (76 y.o. F) Treating RN: [name] Primary Care [name]: [name] Other Clinician: Referring [name]: [name] [name]/Extender: [name] in Treatment: 2 Clinic Level of Care Assessment  Items TOOL 1 Quantity Score []  - Use when EandM and Procedure is performed on INITIAL visit 0 ASSESSMENTS - Nursing Assessment / Reassessment []  - General Physical Exam (combine w/ comprehensive assessment (listed just below) when performed on new 0 pt. evals) []  - 0 Comprehensive Assessment (HX, ROS, Risk Assessments, Wounds Hx, etc.) ASSESSMENTS - Wound and Skin Assessment / Reassessment []  - Dermatologic / Skin Assessment (not related to wound area) 0 ASSESSMENTS - Ostomy and/or Continence Assessment and Care []  - Incontinence Assessment and Management 0 []  - 0 Ostomy Care Assessment and Management (repouching, etc.) PROCESS - Coordination of Care []  - Simple Patient / Family Education for ongoing care 0 []  - 0 Complex (extensive) Patient / Family Education for ongoing care []  - 0 Staff obtains Programmer, systems, Records, Test Results / Process Orders []  - 0 Staff telephones HHA, Nursing Homes / Clarify orders / etc []  - 0 Routine Transfer to another Facility (non-emergent condition) []  - 0 Routine Hospital Admission (non-emergent condition) []  - 0 New Admissions / Biomedical engineer / Ordering NPWT, Apligraf, etc. []  - 0 Emergency Hospital Admission (emergent condition) PROCESS - Special Needs []  - Pediatric / Minor Patient Management 0 []  - 0 Isolation Patient Management []  - 0 Hearing / Language / Visual special needs []  - 0 Assessment of Community assistance (transportation, D/C planning, etc.) []  - 0 Additional assistance / Altered mentation []  - 0 Support Surface(s) Assessment (bed, cushion, seat, etc.) INTERVENTIONS - Miscellaneous []  - External ear exam 0 []  - 0 Patient Transfer (multiple staff / Civil Service fast streamer / Similar devices) []  - 0 Simple Staple / Suture removal (25 or less) []  - 0 Complex Staple / Suture removal (26 or more) []  - 0 Hypo/Hyperglycemic Management (do not check if billed separately) []  - 0 Ankle / Brachial Index (ABI) - do not check if  billed separately Has the patient been seen at the hospital within the last three years: Yes Total Score: 0 Level  Of Care: ____ [name] ([id number]) Electronic Signature(s) Signed: 01/01/2022 5:00:43 PM By: Janice Coria RN Entered By: [name] on 01/01/2022 15:07:27 Janice [name] ([id number]) -------------------------------------------------------------------------------- Encounter Discharge Information Details Patient Name: [name], [name]. Date of Service: 01/01/2022 2:00 PM Medical Record Number: [id number] Patient Account Number: [id number] Date of Birth/Sex: 02-28-1946 (76 y.o. F) Treating RN: [name] Primary Care Zaniya Mcaulay: [name] Other Clinician: Referring [name]: [name] [name]/Extender: [name] in Treatment: 2 Encounter Discharge Information Items Post Procedure Vitals Discharge Condition: Stable Temperature (F): 98 Ambulatory Status: Ambulatory Pulse (bpm): 74 Discharge Destination: Home Respiratory Rate (breaths/min): 18 Transportation: Private Auto Blood Pressure (mmHg): 114/70 Accompanied By: self Schedule Follow-up Appointment: Yes Clinical Summary of Care: Patient Declined Electronic Signature(s) Signed: 01/01/2022 5:00:43 PM By: Janice Coria RN Entered By: [name] on 01/01/2022 15:08:54 [name], [name] ([id number]) -------------------------------------------------------------------------------- Lower Extremity Assessment Details Patient Name: Janice [name]. Date of Service: 01/01/2022 2:00 PM Medical Record Number: [id number] Patient Account Number: [id number] Date of Birth/Sex: 11/28/1946 (76 y.o. F) Treating RN: [name] Primary Care Lilliam Chamblee: [name] Other Clinician: Referring Jareli Highland: [name] Verity Gilcrest/Extender: [name] in Treatment: 2 Edema Assessment Assessed: [Left: No] [Right: No] [Left: Edema] [Right: :] Calf Left: Right: Point of Measurement: 30 cm From  Medial Instep 44 cm 44 cm Ankle Left: Right: Point of Measurement: 10 cm From Medial Instep 25 cm 23 cm Knee To Floor Left: Right: From Medial Instep 42 cm 42 cm Electronic Signature(s) Signed: 01/01/2022 5:00:43 PM By: Janice Coria RN Entered By: [name] on 01/01/2022 14:24:18 Janice [name] ([id number]) -------------------------------------------------------------------------------- Multi Wound Chart Details Patient Name: Janice [name]. Date of Service: 01/01/2022 2:00 PM Medical Record Number: [id number] Patient Account Number: [id number] Date of Birth/Sex: Feb 06, 1946 (75 y.o. F) Treating RN: [name] Primary Care Zanobia Griebel: [name] Other Clinician: Referring Aleczander Fandino: [name] Lucee Brissett/Extender: [name] in Treatment: 2 Vital Signs Height(in): Pulse(bpm): 37 Weight(lbs): Blood Pressure(mmHg): 114/70 Body Mass Index(BMI): Temperature(F): 98 Respiratory Rate(breaths/min): 18 Photos: Wound Location: Right, Lateral Lower Leg Right, Medial Lower Leg Left, Medial Lower Leg Wounding Event: Thermal Burn Thermal Burn Thermal Burn Primary Etiology: 3rd degree Burn 3rd degree Burn 3rd degree Burn Comorbid History: Hypertension Hypertension Hypertension Date Acquired: 11/27/2021 11/27/2021 11/27/2021 Weeks of Treatment: 2 2 2  Wound Status: Open Open Open Measurements L x W x D (cm) 4.5x3.3x0.1 6.5x4.7x0.1 6.5x4x0.1 Area (cm) : 11.663 23.994 20.42 Volume (cm) : 1.166 2.399 2.042 % Reduction in Area: -16.00% -4.40% -8.30% % Reduction in Volume: -16.00% -4.40% -8.30% Classification: Full Thickness Without Exposed Full Thickness Without Exposed Full Thickness Without Exposed Support Structures Support Structures Support Structures Exudate Amount: None Present Medium Medium Exudate Type: N/A Serosanguineous Serosanguineous Exudate Color: N/A red, brown red, brown Granulation Amount: None Present (0%) None Present (0%) Small  (1-33%) Granulation Quality: N/A N/A Pink Necrotic Amount: Large (67-100%) Large (67-100%) Large (67-100%) Necrotic Tissue: Eschar, Adherent [place] Exposed Structures: Fascia: No Fascia: No Fat Layer (Subcutaneous Tissue): Fat Layer (Subcutaneous Tissue): Fat Layer (Subcutaneous Tissue): Yes No No Fascia: No Tendon: No Tendon: No Tendon: No Muscle: No Muscle: No Muscle: No Joint: No Joint: No Joint: No Bone: No Bone: No Bone: No Epithelialization: None None None Wound Number: 4 N/A N/A Photos: N/A N/A Wound Location: Left, Lateral Lower Leg N/A N/A Scronce, [name] ([id number]) Wounding Event: Thermal Burn N/A N/A Primary Etiology: 3rd degree Burn N/A N/A Comorbid History: Hypertension N/A  N/A Date Acquired: 11/27/2021 N/A N/A Weeks of Treatment: 2 N/A N/A Wound Status: Open N/A N/A Measurements L x W x D (cm) 8x5x0.1 N/A N/A Area (cm) : 31.416 N/A N/A Volume (cm) : 3.142 N/A N/A % Reduction in Area: -14.30% N/A N/A % Reduction in Volume: -14.30% N/A N/A Classification: Full Thickness Without Exposed N/A N/A Support Structures Exudate Amount: Medium N/A N/A Exudate Type: Serosanguineous N/A N/A Exudate Color: red, brown N/A N/A Granulation Amount: Small (1-33%) N/A N/A Granulation Quality: Red, Pink N/A N/A Necrotic Amount: Large (67-100%) N/A N/A Necrotic Tissue: Eschar N/A N/A Exposed Structures: Fat Layer (Subcutaneous Tissue): N/A N/A Yes Fascia: No Tendon: No Muscle: No Joint: No Bone: No Epithelialization: None N/A N/A Treatment Notes Electronic Signature(s) Signed: 01/01/2022 5:00:43 PM By: Janice Coria RN Entered By: [name] on 01/01/2022 14:53:05 Janice [name] ([id number]) -------------------------------------------------------------------------------- [place] Details Patient Name: Janice [name]. Date of Service: 01/01/2022 2:00 PM Medical Record Number: [id number] Patient  Account Number: [id number] Date of Birth/Sex: 1946/03/08 (76 y.o. F) Treating RN: [name] Primary Care Dontrez Pettis: [name] Other Clinician: Referring Rie Mcneil: [name] Viviano Bir/Extender: [name] in Treatment: 2 Active Inactive Wound/Skin Impairment Nursing Diagnoses: Knowledge deficit related to ulceration/compromised skin integrity Goals: Patient/caregiver will verbalize understanding of skin care regimen Date Initiated: 12/18/2021 Target Resolution Date: 01/18/2022 Goal Status: Active Ulcer/skin breakdown will have a volume reduction of 30% by week 4 Date Initiated: 12/18/2021 Target Resolution Date: 02/15/2022 Goal Status: Active Ulcer/skin breakdown will have a volume reduction of 50% by week 8 Date Initiated: 12/18/2021 Target Resolution Date: 03/18/2022 Goal Status: Active Ulcer/skin breakdown will have a volume reduction of 80% by week 12 Date Initiated: 12/18/2021 Target Resolution Date: 04/17/2022 Goal Status: Active Ulcer/skin breakdown will heal within 14 weeks Date Initiated: 12/18/2021 Target Resolution Date: 05/18/2022 Goal Status: Active Interventions: Assess patient/caregiver ability to obtain necessary supplies Assess patient/caregiver ability to perform ulcer/skin care regimen upon admission and as needed Assess ulceration(s) every visit Notes: Electronic Signature(s) Signed: 01/01/2022 5:00:43 PM By: Janice Coria RN Entered By: [name] on 01/01/2022 14:50:44 Janice [name] ([id number]) -------------------------------------------------------------------------------- Pain Assessment Details Patient Name: Janice [name]. Date of Service: 01/01/2022 2:00 PM Medical Record Number: [id number] Patient Account Number: [id number] Date of Birth/Sex: Jan 17, 1946 (76 y.o. F) Treating RN: [name] Primary Care Aisley Whan: [name] Other Clinician: Referring Gustave Lindeman: [name] Aslee Such/Extender: [name] in  Treatment: 2 Active Problems Location of Pain Severity and Description of Pain Patient Has Paino No Site Locations Pain Management and Medication Current Pain Management: Electronic Signature(s) Signed: 01/01/2022 5:00:43 PM By: Janice Coria RN Entered By: [name] on 01/01/2022 14:11:22 Janice [name] ([id number]) -------------------------------------------------------------------------------- Patient/Caregiver Education Details Patient Name: Janice [name]. Date of Service: 01/01/2022 2:00 PM Medical Record Number: [id number] Patient Account Number: [id number] Date of Birth/Gender: Dec 08, 1946 (76 y.o. F) Treating RN: [name] Primary Care Physician: [name] Other Clinician: Referring Physician: Janie [name] [name] Physician/Extender: [name] in Treatment: 2 Education Assessment Education Provided To: Patient Education Topics Provided Wound/Skin Impairment: Methods: Explain/Verbal Responses: State content correctly Electronic Signature(s) Signed: 01/01/2022 5:00:43 PM By: Janice Coria RN Entered By: [name] on 01/01/2022 15:44:07 Janice [name] ([id number]) -------------------------------------------------------------------------------- Wound Assessment Details Patient Name: Janice [name]. Date of Service: 01/01/2022 2:00 PM Medical Record Number: [id number] Patient Account Number: [id number] Date of Birth/Sex: 1946/10/23 (76 y.o. F) Treating RN: [name] Primary Care Nia Nathaniel: [name] Other Clinician: Referring Rollie Hynek: [name] Tyresha Fede/Extender:  Kalman Shan Weeks in Treatment: 2 Wound Status Wound Number: 1 Primary Etiology: 3rd degree Burn Wound Location: Right, Lateral Lower Leg Wound Status: Open Wounding Event: Thermal Burn Comorbid History: Hypertension Date Acquired: 11/27/2021 Weeks Of Treatment: 2 Clustered Wound: No Photos Wound Measurements Length: (cm) 4.5 Width: (cm) 3.3 Depth: (cm)  0.1 Area: (cm) 11.663 Volume: (cm) 1.166 % Reduction in Area: -16% % Reduction in Volume: -16% Epithelialization: None Tunneling: No Undermining: No Wound Description Classification: Full Thickness Without Exposed Support Structure Exudate Amount: None Present s Foul Odor After Cleansing: No Slough/Fibrino Yes Wound Bed Granulation Amount: None Present (0%) Exposed Structure Necrotic Amount: Large (67-100%) Fascia Exposed: No Necrotic Quality: Eschar, Adherent Slough Fat Layer (Subcutaneous Tissue) Exposed: No Tendon Exposed: No Muscle Exposed: No Joint Exposed: No Bone Exposed: No Treatment Notes Wound #1 (Lower Leg) Wound Laterality: Right, Lateral Cleanser Peri-Wound Care Topical Santyl Collagenase Ointment, 30 (gm), tube Discharge Instruction: apply nickel thick to wound bed only Torrico, Denisia J. ([id number]) Primary Dressing Gauze Discharge Instruction: As directed: dry, moistened with saline or moistened with Dakins Solution Secondary Dressing Zetuvit Plus Silicone Border Dressing 5x5 (in/in) Secured With 68M Medipore H Soft Cloth Surgical Tape, 2x2 (in/yd) Compression Wrap Compression Stockings Add-Ons Electronic Signature(s) Signed: 01/01/2022 5:00:43 PM By: Janice Coria RN Entered By: [name] on 01/01/2022 14:20:05 Janice [name] ([id number]) -------------------------------------------------------------------------------- Wound Assessment Details Patient Name: Janice [name]. Date of Service: 01/01/2022 2:00 PM Medical Record Number: [id number] Patient Account Number: [id number] Date of Birth/Sex: 09-06-1946 (76 y.o. F) Treating RN: [name] Primary Care Pearle Wandler: [name] Other Clinician: Referring Kimaria Struthers: [name] Disa Riedlinger/Extender: [name] in Treatment: 2 Wound Status Wound Number: 2 Primary Etiology: 3rd degree Burn Wound Location: Right, Medial Lower Leg Wound Status: Open Wounding Event: Thermal  Burn Comorbid History: Hypertension Date Acquired: 11/27/2021 Weeks Of Treatment: 2 Clustered Wound: No Photos Wound Measurements Length: (cm) 6.5 Width: (cm) 4.7 Depth: (cm) 0.1 Area: (cm) 23.994 Volume: (cm) 2.399 % Reduction in Area: -4.4% % Reduction in Volume: -4.4% Epithelialization: None Tunneling: No Undermining: No Wound Description Classification: Full Thickness Without Exposed Support Structu Exudate Amount: Medium Exudate Type: Serosanguineous Exudate Color: red, brown res Foul Odor After Cleansing: No Slough/Fibrino Yes Wound Bed Granulation Amount: None Present (0%) Exposed Structure Necrotic Amount: Large (67-100%) Fascia Exposed: No Necrotic Quality: Eschar, Adherent Slough Fat Layer (Subcutaneous Tissue) Exposed: No Tendon Exposed: No Muscle Exposed: No Joint Exposed: No Bone Exposed: No Treatment Notes Wound #2 (Lower Leg) Wound Laterality: Right, Medial Cleanser Peri-Wound Care Topical Santyl Collagenase Ointment, 30 (gm), tube Rezek, Alberto J. ([id number]) Discharge Instruction: apply nickel thick to wound bed only Primary Dressing Gauze Discharge Instruction: As directed: dry, moistened with saline or moistened with Dakins Solution Secondary Dressing Zetuvit Plus Silicone Border Dressing 5x5 (in/in) Secured With 68M Medipore H Soft Cloth Surgical Tape, 2x2 (in/yd) Compression Wrap Compression Stockings Add-Ons Electronic Signature(s) Signed: 01/01/2022 5:00:43 PM By: Janice Coria RN Entered By: [name] on 01/01/2022 14:20:33 Janice [name] ([id number]) -------------------------------------------------------------------------------- Wound Assessment Details Patient Name: Janice [name]. Date of Service: 01/01/2022 2:00 PM Medical Record Number: [id number] Patient Account Number: [id number] Date of Birth/Sex: July 13, 1946 (76 y.o. F) Treating RN: [name] Primary Care Tyshay Adee: [name] Other Clinician: Referring Nan Maya: [name] Joann Jorge/Extender: [name] in Treatment: 2 Wound Status Wound Number: 3 Primary Etiology: 3rd degree Burn Wound Location: Left, Medial Lower Leg Wound Status: Open Wounding Event: Thermal Burn Comorbid History: Hypertension Date Acquired: 11/27/2021  Weeks Of Treatment: 2 Clustered Wound: No Photos Wound Measurements Length: (cm) 6.5 Width: (cm) 4 Depth: (cm) 0.1 Area: (cm) 20.42 Volume: (cm) 2.042 % Reduction in Area: -8.3% % Reduction in Volume: -8.3% Epithelialization: None Tunneling: No Undermining: No Wound Description Classification: Full Thickness Without Exposed Support Structu Exudate Amount: Medium Exudate Type: Serosanguineous Exudate Color: red, brown res Foul Odor After Cleansing: No Slough/Fibrino Yes Wound Bed Granulation Amount: Small (1-33%) Exposed Structure Granulation Quality: Pink Fascia Exposed: No Necrotic Amount: Large (67-100%) Fat Layer (Subcutaneous Tissue) Exposed: Yes Necrotic Quality: Eschar, Adherent Slough Tendon Exposed: No Muscle Exposed: No Joint Exposed: No Bone Exposed: No Treatment Notes Wound #3 (Lower Leg) Wound Laterality: Left, Medial Cleanser Peri-Wound Care Topical Santyl Collagenase Ointment, 30 (gm), tube Helmes, Tequita J. ([id number]) Discharge Instruction: apply nickel thick to wound bed only Primary Dressing Gauze Discharge Instruction: As directed: dry, moistened with saline or moistened with Dakins Solution Secondary Dressing Zetuvit Plus Silicone Border Dressing 5x5 (in/in) Secured With 76M Medipore H Soft Cloth Surgical Tape, 2x2 (in/yd) Compression Wrap Compression Stockings Add-Ons Electronic Signature(s) Signed: 01/01/2022 5:00:43 PM By: Janice Coria RN Entered By: [name] on 01/01/2022 14:21:13 Janice [name] ([id number]) -------------------------------------------------------------------------------- Wound Assessment Details Patient Name: Janice [name]. Date of  Service: 01/01/2022 2:00 PM Medical Record Number: [id number] Patient Account Number: [id number] Date of Birth/Sex: 1946/09/08 (76 y.o. F) Treating RN: [name] Primary Care Deepa Barthel: [name] Other Clinician: Referring Breyer Tejera: [name] Mikaella Escalona/Extender: [name] in Treatment: 2 Wound Status Wound Number: 4 Primary Etiology: 3rd degree Burn Wound Location: Left, Lateral Lower Leg Wound Status: Open Wounding Event: Thermal Burn Comorbid History: Hypertension Date Acquired: 11/27/2021 Weeks Of Treatment: 2 Clustered Wound: No Photos Wound Measurements Length: (cm) 8 Width: (cm) 5 Depth: (cm) 0.1 Area: (cm) 31.416 Volume: (cm) 3.142 % Reduction in Area: -14.3% % Reduction in Volume: -14.3% Epithelialization: None Tunneling: No Undermining: No Wound Description Classification: Full Thickness Without Exposed Support Structu Exudate Amount: Medium Exudate Type: Serosanguineous Exudate Color: red, brown res Foul Odor After Cleansing: No Slough/Fibrino Yes Wound Bed Granulation Amount: Small (1-33%) Exposed Structure Granulation Quality: Red, Pink Fascia Exposed: No Necrotic Amount: Large (67-100%) Fat Layer (Subcutaneous Tissue) Exposed: Yes Necrotic Quality: Eschar Tendon Exposed: No Muscle Exposed: No Joint Exposed: No Bone Exposed: No Treatment Notes Wound #4 (Lower Leg) Wound Laterality: Left, Lateral Cleanser Peri-Wound Care Topical Primary Dressing Hochmuth, [name] ([id number]) Gauze Discharge Instruction: As directed: dry, moistened with saline or moistened with Dakins Solution Santyl Collagenase Ointment, 30 (gm), tube Secondary Dressing Zetuvit Plus Silicone Border Dressing 5x5 (in/in) Secured With 76M Medipore H Soft Cloth Surgical Tape, 2x2 (in/yd) Compression Wrap Compression Stockings Add-Ons Electronic Signature(s) Signed: 01/01/2022 5:00:43 PM By: Janice Coria RN Entered By: [name] on 01/01/2022  14:21:56 Janice [name] ([id number]) -------------------------------------------------------------------------------- Vitals Details Patient Name: Daddona, [name]. Date of Service: 01/01/2022 2:00 PM Medical Record Number: [id number] Patient Account Number: [id number] Date of Birth/Sex: 08-24-1946 (76 y.o. F) Treating RN: [name] Primary Care Rogene Meth: [name] Other Clinician: Referring Tonisha Silvey: [name] Zenith Kercheval/Extender: [name] in Treatment: 2 Vital Signs Time Taken: 14:10 Temperature (F): 98 Pulse (bpm): 74 Respiratory Rate (breaths/min): 18 Blood Pressure (mmHg): 114/70 Reference Range: 80 - 120 mg / dl Electronic Signature(s) Signed: 01/01/2022 5:00:43 PM By: Janice Coria RN Entered By: [name] on 01/01/2022 14:11:15

## 2022-01-02 DIAGNOSIS — R42 Dizziness and giddiness: Secondary | ICD-10-CM | POA: Diagnosis not present

## 2022-01-02 DIAGNOSIS — R0602 Shortness of breath: Secondary | ICD-10-CM | POA: Diagnosis not present

## 2022-01-02 DIAGNOSIS — M25531 Pain in right wrist: Secondary | ICD-10-CM | POA: Diagnosis not present

## 2022-01-02 DIAGNOSIS — H6692 Otitis media, unspecified, left ear: Secondary | ICD-10-CM | POA: Diagnosis not present

## 2022-01-02 DIAGNOSIS — S81802A Unspecified open wound, left lower leg, initial encounter: Secondary | ICD-10-CM | POA: Diagnosis not present

## 2022-01-02 DIAGNOSIS — S81801A Unspecified open wound, right lower leg, initial encounter: Secondary | ICD-10-CM | POA: Diagnosis not present

## 2022-01-02 DIAGNOSIS — W19XXXA Unspecified fall, initial encounter: Secondary | ICD-10-CM | POA: Diagnosis not present

## 2022-01-02 DIAGNOSIS — M6281 Muscle weakness (generalized): Secondary | ICD-10-CM | POA: Diagnosis not present

## 2022-01-03 DIAGNOSIS — S52501A Unspecified fracture of the lower end of right radius, initial encounter for closed fracture: Secondary | ICD-10-CM | POA: Diagnosis not present

## 2022-01-06 ENCOUNTER — Encounter: Payer: Self-pay | Admitting: Neurology

## 2022-01-06 ENCOUNTER — Other Ambulatory Visit: Payer: Self-pay

## 2022-01-06 ENCOUNTER — Ambulatory Visit (INDEPENDENT_AMBULATORY_CARE_PROVIDER_SITE_OTHER): Payer: Medicare Other | Admitting: Neurology

## 2022-01-06 VITALS — BP 132/88 | HR 90 | Ht 66.0 in | Wt 204.0 lb

## 2022-01-06 DIAGNOSIS — D501 Sideropenic dysphagia: Secondary | ICD-10-CM | POA: Diagnosis not present

## 2022-01-06 DIAGNOSIS — R1115 Cyclical vomiting syndrome unrelated to migraine: Secondary | ICD-10-CM | POA: Diagnosis not present

## 2022-01-06 DIAGNOSIS — G4733 Obstructive sleep apnea (adult) (pediatric): Secondary | ICD-10-CM

## 2022-01-06 DIAGNOSIS — M7989 Other specified soft tissue disorders: Secondary | ICD-10-CM

## 2022-01-06 DIAGNOSIS — G909 Disorder of the autonomic nervous system, unspecified: Secondary | ICD-10-CM | POA: Diagnosis not present

## 2022-01-06 DIAGNOSIS — G2581 Restless legs syndrome: Secondary | ICD-10-CM

## 2022-01-06 DIAGNOSIS — Z9884 Bariatric surgery status: Secondary | ICD-10-CM

## 2022-01-06 DIAGNOSIS — G9089 Other disorders of autonomic nervous system: Secondary | ICD-10-CM | POA: Insufficient documentation

## 2022-01-06 DIAGNOSIS — Z9989 Dependence on other enabling machines and devices: Secondary | ICD-10-CM | POA: Diagnosis not present

## 2022-01-06 NOTE — Patient Instructions (Signed)
I consider a skin biopsy in the near future, awaiting NCV and EMG results.

## 2022-01-06 NOTE — Progress Notes (Signed)
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 that she weaned herself off all narcotics.  Plan:  Treatment plan and additional workup :  Small fiber neuropathy, dysautonomia, proximal weakness.  .  1  neuropathic pain and numbness.  Sensory symptoms usually precede motor symptoms. Patients typically present with slowly progressive sensory loss and dysesthesias such as numbness, a burning sensation and pain in the feet, and mild gait abnormalities. As the syndrome progresses, mild weakness of the lower legs and hand symptoms may begin, resulting in the classic "stocking and glove" distribution of sensory loss. The numbness may continue to extend proximally in severe cases, affecting the intercostal nerves (the next longest nerve fibers after the arms) and causing sensory loss over the sternum. The top of the head may be affected with further progression.   ?Clinical features - Small-fiber neuropathy presents as a painful sensory neuropathy. The initial symptoms are painful dysesthesias in the distal portions of the limbs, most often in the lower extremities. The pain is often described as burning, shock-like, or prickly and is associated with allodynia (pain with nonpainful stimuli) and hyperalgesia (exaggerated response to painful stimuli). In the  majority of patients, the spread of dysesthesias occurs over months to years to involve more proximal regions, including the trunk and the face .  Affected patients most commonly have a distal and symmetric distribution of burning paresthesia, most likely due to a dying-back axonopathy. However, some patients may have an asymmetric and patchy "non-length-dependent" distribution   Dysautonomia - orthostatic dizziness without dehydration. Mrs. Bischoff's sensory neuropathy now makes her weak- Dannenberg strength.  She is not diabetic she has no documented thyroid disease and neither did her mother, nor has her daughter -but all of these women half severe painful dysesthesias, leg cramps, and restless legs.     2. OSA on CPAP.  Highly compliant.  The patient has been 100% compliant by days and hours to 6 hours and 38 minutes average use.  Minimum pressure is 6 and maximum pressure 15 cmH2O to centimeter expiratory pressure relief and heated humidity are provided.  She was set up on May 16, 2020.  Residual AHI is 1.5/h very good the 95th percentile pressure is 13.1 cmH2O.  There needs to be no change to the interface or settings.  The patient reported an Epworth sleepiness score of 11 points and a   3> RLS with iron deficiency - and additional low ferritin and RLS- iron depletion, malabsorption? Hematologist Dr. Beryle Beams  evaluated her discrepancy between very low ferritin levels  and apparently sufficient iron storage.  She has maintained oral iron intake as well as multivitamins and multi minerals. 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   Rv in 6 month with me or NP, .   I thank Dr [name] for her headache expertise and treatment.  The patient has been relieved of neck pain and occipital neuralgic pain and pain with radiculopathic distribution to the shoulders.   I will ask her to repeat NCV and  EMG, and to advise me if the patient needs a skin biopsy to further evaluate her .   Asencion Partridge Yeira Gulden MD  01/06/2022

## 2022-01-08 ENCOUNTER — Other Ambulatory Visit: Payer: Self-pay

## 2022-01-08 ENCOUNTER — Encounter (HOSPITAL_BASED_OUTPATIENT_CLINIC_OR_DEPARTMENT_OTHER): Payer: Medicare Other | Admitting: Internal Medicine

## 2022-01-08 DIAGNOSIS — G4733 Obstructive sleep apnea (adult) (pediatric): Secondary | ICD-10-CM | POA: Diagnosis not present

## 2022-01-08 DIAGNOSIS — T24032A Burn of unspecified degree of left lower leg, initial encounter: Secondary | ICD-10-CM | POA: Diagnosis not present

## 2022-01-08 DIAGNOSIS — T24031A Burn of unspecified degree of right lower leg, initial encounter: Secondary | ICD-10-CM | POA: Diagnosis not present

## 2022-01-08 NOTE — Progress Notes (Signed)
[name] ([id number]) Visit Report for 01/08/2022 Arrival Information Details Patient Name: [name]. Date of Service: 01/08/2022 3:30 PM Medical Record Number: [id number] Patient Account Number: [id number] Date of Birth/Sex: August 19, 1946 (76 y.o. F) Treating RN: Carlene Coria Primary Care Lauramae Kneisley: [name] Other Clinician: Referring Arad Burston: [name] Treating Avynn Klassen/Extender: Yaakov Guthrie in Treatment: 3 Visit Information History Since Last Visit All ordered tests and consults were completed: No Patient Arrived: Ambulatory Added or deleted any medications: No Arrival Time: 15:50 Any new allergies or adverse reactions: No Accompanied By: self Had a fall or experienced change in No Transfer Assistance: None activities of daily living that may affect Patient Identification Verified: Yes risk of falls: Secondary Verification Process Completed: Yes Signs or symptoms of abuse/neglect since last visito No Patient Requires Transmission-Based Precautions: No Hospitalized since last visit: No Patient Has Alerts: No Implantable device outside of the clinic excluding No cellular tissue based products placed in the center since last visit: Has Dressing in Place as Prescribed: Yes Pain Present Now: No Electronic Signature(s) Signed: 01/08/2022 4:29:00 PM By: Carlene Coria RN Entered By: Carlene Coria on 01/08/2022 15:56:09 [name] ([id number]) -------------------------------------------------------------------------------- Clinic Level of Care Assessment Details Patient Name: [name]. Date of Service: 01/08/2022 3:30 PM Medical Record Number: [id number] Patient Account Number: [id number] Date of Birth/Sex: May 01, 1946 (76 y.o. F) Treating RN: Carlene Coria Primary Care Mallerie Blok: [name] Other Clinician: Referring Leon Goodnow: [name] Treating Pattijo Juste/Extender: Yaakov Guthrie in Treatment: 3 Clinic Level of Care Assessment Items TOOL  4 Quantity Score []  - Use when only an EandM is performed on FOLLOW-UP visit 0 ASSESSMENTS - Nursing Assessment / Reassessment []  - Reassessment of Co-morbidities (includes updates in patient status) 0 []  - 0 Reassessment of Adherence to Treatment Plan ASSESSMENTS - Wound and Skin Assessment / Reassessment []  - Simple Wound Assessment / Reassessment - one wound 0 []  - 0 Complex Wound Assessment / Reassessment - multiple wounds []  - 0 Dermatologic / Skin Assessment (not related to wound area) ASSESSMENTS - Focused Assessment []  - Circumferential Edema Measurements - multi extremities 0 []  - 0 Nutritional Assessment / Counseling / Intervention []  - 0 Lower Extremity Assessment (monofilament, tuning fork, pulses) []  - 0 Peripheral Arterial Disease Assessment (using hand held doppler) ASSESSMENTS - Ostomy and/or Continence Assessment and Care []  - Incontinence Assessment and Management 0 []  - 0 Ostomy Care Assessment and Management (repouching, etc.) PROCESS - Coordination of Care []  - Simple Patient / Family Education for ongoing care 0 []  - 0 Complex (extensive) Patient / Family Education for ongoing care []  - 0 Staff obtains Programmer, systems, Records, Test Results / Process Orders []  - 0 Staff telephones HHA, Nursing Homes / Clarify orders / etc []  - 0 Routine Transfer to another Facility (non-emergent condition) []  - 0 Routine Hospital Admission (non-emergent condition) []  - 0 New Admissions / Biomedical engineer / Ordering NPWT, Apligraf, etc. []  - 0 Emergency Hospital Admission (emergent condition) []  - 0 Simple Discharge Coordination []  - 0 Complex (extensive) Discharge Coordination PROCESS - Special Needs []  - Pediatric / Minor Patient Management 0 []  - 0 Isolation Patient Management []  - 0 Hearing / Language / Visual special needs []  - 0 Assessment of Community assistance (transportation, D/C planning, etc.) []  - 0 Additional assistance / Altered mentation []  -  0 Support Surface(s) Assessment (bed, cushion, seat, etc.) INTERVENTIONS - Wound Cleansing / Measurement [name], [name] ([id number]) []  - 0 Simple Wound Cleansing - one wound []  -  0 Complex Wound Cleansing - multiple wounds []  - 0 Wound Imaging (photographs - any number of wounds) []  - 0 Wound Tracing (instead of photographs) []  - 0 Simple Wound Measurement - one wound []  - 0 Complex Wound Measurement - multiple wounds INTERVENTIONS - Wound Dressings []  - Small Wound Dressing one or multiple wounds 0 []  - 0 Medium Wound Dressing one or multiple wounds []  - 0 Large Wound Dressing one or multiple wounds []  - 0 Application of Medications - topical []  - 0 Application of Medications - injection INTERVENTIONS - Miscellaneous []  - External ear exam 0 []  - 0 Specimen Collection (cultures, biopsies, blood, body fluids, etc.) []  - 0 Specimen(s) / Culture(s) sent or taken to Lab for analysis []  - 0 Patient Transfer (multiple staff / Civil Service fast streamer / Similar devices) []  - 0 Simple Staple / Suture removal (25 or less) []  - 0 Complex Staple / Suture removal (26 or more) []  - 0 Hypo / Hyperglycemic Management (close monitor of Blood Glucose) []  - 0 Ankle / Brachial Index (ABI) - do not check if billed separately []  - 0 Vital Signs Has the patient been seen at the hospital within the last three years: Yes Total Score: 0 Level Of Care: ____ Electronic Signature(s) Signed: 01/08/2022 4:54:49 PM By: Carlene Coria RN Entered By: Carlene Coria on 01/08/2022 16:48:00 [name] ([id number]) -------------------------------------------------------------------------------- Encounter Discharge Information Details Patient Name: [name]. Date of Service: 01/08/2022 3:30 PM Medical Record Number: [id number] Patient Account Number: [id number] Date of Birth/Sex: 21-Sep-1946 (76 y.o. F) Treating RN: Carlene Coria Primary Care Lurlie Wigen: [name] Other Clinician: Referring Lameeka Schleifer: [name] Treating Ayodele Sangalang/Extender: Yaakov Guthrie in Treatment: 3 Encounter Discharge Information Items Discharge Condition: Stable Ambulatory Status: Ambulatory Discharge Destination: Home Transportation: Private Auto Accompanied By: self Schedule Follow-up Appointment: Yes Clinical Summary of Care: Patient Declined Electronic Signature(s) Signed: 01/08/2022 4:53:19 PM By: Carlene Coria RN Entered By: Carlene Coria on 01/08/2022 16:53:19 Kydd, [name] ([id number]) -------------------------------------------------------------------------------- Lower Extremity Assessment Details Patient Name: Lebeda, [name]. Date of Service: 01/08/2022 3:30 PM Medical Record Number: [id number] Patient Account Number: [id number] Date of Birth/Sex: Sep 17, 1946 (76 y.o. F) Treating RN: Carlene Coria Primary Care Milton Streicher: [name] Other Clinician: Referring Adelynn Gipe: [name] Treating Kieara Schwark/Extender: Yaakov Guthrie in Treatment: 3 Edema Assessment Assessed: [Left: No] [Right: No] Edema: [Left: Yes] [Right: Yes] Calf Left: Right: Point of Measurement: 30 cm From Medial Instep 44 cm 45 cm Ankle Left: Right: Point of Measurement: 10 cm From Medial Instep 27 cm 23 cm Knee To Floor Left: Right: From Medial Instep 42 cm 42 cm Vascular Assessment Pulses: Dorsalis Pedis Palpable: [Left:Yes] [Right:Yes] Electronic Signature(s) Signed: 01/08/2022 4:29:00 PM By: Carlene Coria RN Entered By: Carlene Coria on 01/08/2022 16:06:13 Lema, [name] ([id number]) -------------------------------------------------------------------------------- Multi Wound Chart Details Patient Name: Spradley, [name]. Date of Service: 01/08/2022 3:30 PM Medical Record Number: [id number] Patient Account Number: [id number] Date of Birth/Sex: 11/16/1946 (76 y.o. F) Treating RN: Carlene Coria Primary Care Clever Geraldo: [name] Other Clinician: Referring Lennyn Bellanca: [name] Treating Merek Niu/Extender: Yaakov Guthrie in Treatment: 3 Vital Signs Height(in): Pulse(bpm): 64 Weight(lbs): Blood Pressure(mmHg): 107/70 Body Mass Index(BMI): Temperature(F): 97.5 Respiratory Rate(breaths/min): 18 Photos: Wound Location: Right, Lateral Lower Leg Right, Medial Lower Leg Left, Medial Lower Leg Wounding Event: Thermal Burn Thermal Burn Thermal Burn Primary Etiology: 3rd degree Burn 3rd degree Burn 3rd degree Burn Comorbid History: Hypertension Hypertension Hypertension Date Acquired: 11/27/2021 11/27/2021 11/27/2021 Weeks of Treatment: 3 3 3  Wound Status: Open Open Open Wound Recurrence: No No No Measurements L x W x D (cm) 5x4x0.1 6x6.5x0.1 6.5x4.5x0.1 Area (cm) : 15.708 30.631 22.973 Volume (cm) : 1.571 3.063 2.297 % Reduction in Area: -56.30% -33.30% -21.90% % Reduction in Volume: -56.30% -33.30% -21.90% Classification: Full Thickness Without Exposed Full Thickness Without Exposed Full Thickness Without Exposed Support Structures Support Structures Support Structures Exudate Amount: Medium Medium Medium Exudate Type: Serosanguineous Serosanguineous Serosanguineous Exudate Color: red, brown red, brown red, brown Granulation Amount: None Present (0%) None Present (0%) Small (1-33%) Granulation Quality: N/A N/A Pink Necrotic Amount: Large (67-100%) Large (67-100%) Large (67-100%) Necrotic Tissue: Eschar, Adherent Boneau Exposed Structures: Fascia: No Fascia: No Fat Layer (Subcutaneous Tissue): Fat Layer (Subcutaneous Tissue): Fat Layer (Subcutaneous Tissue): Yes No No Fascia: No Tendon: No Tendon: No Tendon: No Muscle: No Muscle: No Muscle: No Joint: No Joint: No Joint: No Bone: No Bone: No Bone: No Epithelialization: None None None Wound Number: 4 N/A N/A Photos: N/A N/A KINZA, GOUVEIA ([id number]) Wound Location: Left, Lateral Lower Leg N/A N/A Wounding Event: Thermal Burn N/A N/A Primary Etiology: 3rd degree Burn N/A  N/A Comorbid History: Hypertension N/A N/A Date Acquired: 11/27/2021 N/A N/A Weeks of Treatment: 3 N/A N/A Wound Status: Open N/A N/A Wound Recurrence: No N/A N/A Measurements L x W x D (cm) 7.5x5.5x0.1 N/A N/A Area (cm) : 32.398 N/A N/A Volume (cm) : 3.24 N/A N/A % Reduction in Area: -17.90% N/A N/A % Reduction in Volume: -17.90% N/A N/A Classification: Full Thickness Without Exposed N/A N/A Support Structures Exudate Amount: Medium N/A N/A Exudate Type: Serosanguineous N/A N/A Exudate Color: red, brown N/A N/A Granulation Amount: Small (1-33%) N/A N/A Granulation Quality: Red, Pink N/A N/A Necrotic Amount: Large (67-100%) N/A N/A Necrotic Tissue: Adherent Slough N/A N/A Exposed Structures: Fat Layer (Subcutaneous Tissue): N/A N/A Yes Fascia: No Tendon: No Muscle: No Joint: No Bone: No Epithelialization: None N/A N/A Treatment Notes Electronic Signature(s) Signed: 01/08/2022 4:54:49 PM By: Carlene Coria RN Entered By: Carlene Coria on 01/08/2022 16:35:23 Keepers, [name] ([id number]) -------------------------------------------------------------------------------- Multi-Disciplinary Care Plan Details Patient Name: Pernice, [name]. Date of Service: 01/08/2022 3:30 PM Medical Record Number: [id number] Patient Account Number: [id number] Date of Birth/Sex: 09-01-1946 (76 y.o. F) Treating RN: Carlene Coria Primary Care Ambri Miltner: [name] Other Clinician: Referring Shelly Shoultz: [name] Treating Lorre Opdahl/Extender: Yaakov Guthrie in Treatment: 3 Active Inactive Wound/Skin Impairment Nursing Diagnoses: Knowledge deficit related to ulceration/compromised skin integrity Goals: Patient/caregiver will verbalize understanding of skin care regimen Date Initiated: 12/18/2021 Target Resolution Date: 01/18/2022 Goal Status: Active Ulcer/skin breakdown will have a volume reduction of 30% by week 4 Date Initiated: 12/18/2021 Target Resolution Date: 02/15/2022 Goal Status:  Active Ulcer/skin breakdown will have a volume reduction of 50% by week 8 Date Initiated: 12/18/2021 Target Resolution Date: 03/18/2022 Goal Status: Active Ulcer/skin breakdown will have a volume reduction of 80% by week 12 Date Initiated: 12/18/2021 Target Resolution Date: 04/17/2022 Goal Status: Active Ulcer/skin breakdown will heal within 14 weeks Date Initiated: 12/18/2021 Target Resolution Date: 05/18/2022 Goal Status: Active Interventions: Assess patient/caregiver ability to obtain necessary supplies Assess patient/caregiver ability to perform ulcer/skin care regimen upon admission and as needed Assess ulceration(s) every visit Notes: Electronic Signature(s) Signed: 01/08/2022 4:54:49 PM By: Carlene Coria RN Entered By: Carlene Coria on 01/08/2022 16:35:04 Tinner, [name] ([id number]) -------------------------------------------------------------------------------- Pain Assessment Details Patient Name: Whisman, [name]. Date of Service: 01/08/2022 3:30 PM Medical Record Number: [id number] Patient Account Number: [id number] Date of  Birth/Sex: 1946-08-26 (76 y.o. F) Treating RN: Carlene Coria Primary Care Annaleah Arata: [name] Other Clinician: Referring Athenia Rys: [name] Treating Kamarrion Stfort/Extender: Yaakov Guthrie in Treatment: 3 Active Problems Location of Pain Severity and Description of Pain Patient Has Paino No Site Locations Pain Management and Medication Current Pain Management: Electronic Signature(s) Signed: 01/08/2022 4:29:00 PM By: Carlene Coria RN Entered By: Carlene Coria on 01/08/2022 15:57:15 Schlotzhauer, [name] ([id number]) -------------------------------------------------------------------------------- Patient/Caregiver Education Details Patient Name: Donlon, [name]. Date of Service: 01/08/2022 3:30 PM Medical Record Number: [id number] Patient Account Number: [id number] Date of Birth/Gender: Mar 21, 1946 (76 y.o. F) Treating RN: Carlene Coria Primary Care Physician: [name] Other Clinician: Referring Physician: [name] Treating Physician/Extender: Yaakov Guthrie in Treatment: 3 Education Assessment Education Provided To: Patient Education Topics Provided Wound/Skin Impairment: Methods: Explain/Verbal Responses: State content correctly Electronic Signature(s) Signed: 01/08/2022 4:54:49 PM By: Carlene Coria RN Entered By: Carlene Coria on 01/08/2022 16:51:28 Grabert, [name] ([id number]) -------------------------------------------------------------------------------- Wound Assessment Details Patient Name: Carrigan, [name]. Date of Service: 01/08/2022 3:30 PM Medical Record Number: [id number] Patient Account Number: [id number] Date of Birth/Sex: December 04, 1946 (76 y.o. F) Treating RN: Carlene Coria Primary Care Indiana Pechacek: [name] Other Clinician: Referring Javarri Segal: [name] Treating Estle Huguley/Extender: Yaakov Guthrie in Treatment: 3 Wound Status Wound Number: 1 Primary Etiology: 3rd degree Burn Wound Location: Right, Lateral Lower Leg Wound Status: Open Wounding Event: Thermal Burn Comorbid History: Hypertension Date Acquired: 11/27/2021 Weeks Of Treatment: 3 Clustered Wound: No Photos Wound Measurements Length: (cm) 5 Width: (cm) 4 Depth: (cm) 0.1 Area: (cm) 15.708 Volume: (cm) 1.571 % Reduction in Area: -56.3% % Reduction in Volume: -56.3% Epithelialization: None Tunneling: No Undermining: No Wound Description Classification: Full Thickness Without Exposed Support Structu Exudate Amount: Medium Exudate Type: Serosanguineous Exudate Color: red, brown res Foul Odor After Cleansing: No Slough/Fibrino Yes Wound Bed Granulation Amount: None Present (0%) Exposed Structure Necrotic Amount: Large (67-100%) Fascia Exposed: No Necrotic Quality: Eschar, Adherent Slough Fat Layer (Subcutaneous Tissue) Exposed: No Tendon Exposed: No Muscle Exposed: No Joint Exposed: No Bone Exposed: No Treatment Notes Wound #1  (Lower Leg) Wound Laterality: Right, Lateral Cleanser Peri-Wound Care Topical Santyl Collagenase Ointment, 30 (gm), tube Haught, Kashvi J. ([id number]) Discharge Instruction: apply nickel thick to wound bed only Primary Dressing Gauze Discharge Instruction: As directed: dry, moistened with saline or moistened with Dakins Solution Secondary Dressing Zetuvit Plus Silicone Border Dressing 5x5 (in/in) Secured With 2M Medipore H Soft Cloth Surgical Tape, 2x2 (in/yd) Compression Wrap Compression Stockings Add-Ons Electronic Signature(s) Signed: 01/08/2022 4:29:00 PM By: Carlene Coria RN Entered By: Carlene Coria on 01/08/2022 16:03:09 Marengo, [name] ([id number]) -------------------------------------------------------------------------------- Wound Assessment Details Patient Name: Mancia, [name]. Date of Service: 01/08/2022 3:30 PM Medical Record Number: [id number] Patient Account Number: [id number] Date of Birth/Sex: [birth year] (76 y.o. F) Treating RN: Carlene Coria Primary Care Ieesha Abbasi: [name] Other Clinician: Referring Dhaval Woo: [name] Treating Glee Lashomb/Extender: Yaakov Guthrie in Treatment: 3 Wound Status Wound Number: 2 Primary Etiology: 3rd degree Burn Wound Location: Right, Medial Lower Leg Wound Status: Open Wounding Event: Thermal Burn Comorbid History: Hypertension Date Acquired: 11/27/2021 Weeks Of Treatment: 3 Clustered Wound: No Photos Wound Measurements Length: (cm) 6 Width: (cm) 6.5 Depth: (cm) 0.1 Area: (cm) 30.631 Volume: (cm) 3.063 % Reduction in Area: -33.3% % Reduction in Volume: -33.3% Epithelialization: None Tunneling: No Undermining: No Wound Description Classification: Full Thickness Without Exposed Support Structu Exudate Amount: Medium Exudate Type: Serosanguineous Exudate Color: red, brown res Foul Odor After Cleansing: No Slough/Fibrino Yes Wound  Bed Granulation Amount: None Present (0%) Exposed Structure Necrotic Amount:  Large (67-100%) Fascia Exposed: No Necrotic Quality: Adherent Slough Fat Layer (Subcutaneous Tissue) Exposed: No Tendon Exposed: No Muscle Exposed: No Joint Exposed: No Bone Exposed: No Treatment Notes Wound #2 (Lower Leg) Wound Laterality: Right, Medial Cleanser Peri-Wound Care Topical Santyl Collagenase Ointment, 30 (gm), tube Matsushita, Courtne J. ([id number]) Discharge Instruction: apply nickel thick to wound bed only Primary Dressing Gauze Discharge Instruction: As directed: dry, moistened with saline or moistened with Dakins Solution Secondary Dressing Zetuvit Plus Silicone Border Dressing 5x5 (in/in) Secured With 823M Medipore H Soft Cloth Surgical Tape, 2x2 (in/yd) Compression Wrap Compression Stockings Add-Ons Electronic Signature(s) Signed: 01/08/2022 4:29:00 PM By: Carlene Coria RN Entered By: Carlene Coria on 01/08/2022 16:03:43 Branham, [name] ([id number]) -------------------------------------------------------------------------------- Wound Assessment Details Patient Name: Paskett, [name]. Date of Service: 01/08/2022 3:30 PM Medical Record Number: [id number] Patient Account Number: [id number] Date of Birth/Sex: 10-09-1946 (76 y.o. F) Treating RN: Carlene Coria Primary Care Ashaun Gaughan: [name] Other Clinician: Referring Jarquis Walker: [name] Treating Siegfried Vieth/Extender: Yaakov Guthrie in Treatment: 3 Wound Status Wound Number: 3 Primary Etiology: 3rd degree Burn Wound Location: Left, Medial Lower Leg Wound Status: Open Wounding Event: Thermal Burn Comorbid History: Hypertension Date Acquired: 11/27/2021 Weeks Of Treatment: 3 Clustered Wound: No Photos Wound Measurements Length: (cm) 6.5 Width: (cm) 4.5 Depth: (cm) 0.1 Area: (cm) 22.973 Volume: (cm) 2.297 % Reduction in Area: -21.9% % Reduction in Volume: -21.9% Epithelialization: None Tunneling: No Undermining: No Wound Description Classification: Full Thickness Without Exposed Support  Structu Exudate Amount: Medium Exudate Type: Serosanguineous Exudate Color: red, brown res Foul Odor After Cleansing: No Slough/Fibrino Yes Wound Bed Granulation Amount: Small (1-33%) Exposed Structure Granulation Quality: Pink Fascia Exposed: No Necrotic Amount: Large (67-100%) Fat Layer (Subcutaneous Tissue) Exposed: Yes Necrotic Quality: Eschar, Adherent Slough Tendon Exposed: No Muscle Exposed: No Joint Exposed: No Bone Exposed: No Treatment Notes Wound #3 (Lower Leg) Wound Laterality: Left, Medial Cleanser Peri-Wound Care Topical Santyl Collagenase Ointment, 30 (gm), tube Wacha, Aleese J. ([id number]) Discharge Instruction: apply nickel thick to wound bed only Primary Dressing Gauze Discharge Instruction: As directed: dry, moistened with saline or moistened with Dakins Solution Secondary Dressing Zetuvit Plus Silicone Border Dressing 5x5 (in/in) Secured With 823M Medipore H Soft Cloth Surgical Tape, 2x2 (in/yd) Compression Wrap Compression Stockings Add-Ons Electronic Signature(s) Signed: 01/08/2022 4:29:00 PM By: Carlene Coria RN Entered By: Carlene Coria on 01/08/2022 16:04:03 Rocca, [name] ([id number]) -------------------------------------------------------------------------------- Wound Assessment Details Patient Name: Rabbani, [name]. Date of Service: 01/08/2022 3:30 PM Medical Record Number: [id number] Patient Account Number: [id number] Date of Birth/Sex: Dec 17, 1945 (76 y.o. F) Treating RN: Carlene Coria Primary Care Rajiv Parlato: [name] Other Clinician: Referring Safari Cinque: [name] Treating Serina Nichter/Extender: Yaakov Guthrie in Treatment: 3 Wound Status Wound Number: 4 Primary Etiology: 3rd degree Burn Wound Location: Left, Lateral Lower Leg Wound Status: Open Wounding Event: Thermal Burn Comorbid History: Hypertension Date Acquired: 11/27/2021 Weeks Of Treatment: 3 Clustered Wound: No Photos Wound Measurements Length: (cm) 7.5 Width: (cm)  5.5 Depth: (cm) 0.1 Area: (cm) 32.398 Volume: (cm) 3.24 % Reduction in Area: -17.9% % Reduction in Volume: -17.9% Epithelialization: None Tunneling: No Undermining: No Wound Description Classification: Full Thickness Without Exposed Support Structu Exudate Amount: Medium Exudate Type: Serosanguineous Exudate Color: red, brown res Foul Odor After Cleansing: No Slough/Fibrino Yes Wound Bed Granulation Amount: Small (1-33%) Exposed Structure Granulation Quality: Red, Pink Fascia Exposed: No Necrotic Amount: Large (67-100%) Fat Layer (Subcutaneous Tissue) Exposed: Yes Necrotic Quality: Adherent Slough  Tendon Exposed: No Muscle Exposed: No Joint Exposed: No Bone Exposed: No Treatment Notes Wound #4 (Lower Leg) Wound Laterality: Left, Lateral Cleanser Peri-Wound Care Topical Primary Dressing Tsosie, SAMIKSHA PELLICANO ([id number]) Gauze Discharge Instruction: As directed: dry, moistened with saline or moistened with Dakins Solution Santyl Collagenase Ointment, 30 (gm), tube Secondary Dressing Zetuvit Plus Silicone Border Dressing 5x5 (in/in) Secured With 38M Medipore H Soft Cloth Surgical Tape, 2x2 (in/yd) Compression Wrap Compression Stockings Add-Ons Electronic Signature(s) Signed: 01/08/2022 4:29:00 PM By: Carlene Coria RN Entered By: Carlene Coria on 01/08/2022 16:04:29 Nadeem, [name] ([id number]) -------------------------------------------------------------------------------- Vitals Details Patient Name: Crespin, [name]. Date of Service: 01/08/2022 3:30 PM Medical Record Number: [id number] Patient Account Number: [id number] Date of Birth/Sex: Nov 10, 1946 (76 y.o. F) Treating RN: Carlene Coria Primary Care Kota Ciancio: [name] Other Clinician: Referring Joelyn Lover: [name] Treating Cullen Vanallen/Extender: Yaakov Guthrie in Treatment: 3 Vital Signs Time Taken: 13:56 Temperature (F): 97.5 Pulse (bpm): 79 Respiratory Rate (breaths/min): 18 Blood Pressure (mmHg):  107/70 Reference Range: 80 - 120 mg / dl Electronic Signature(s) Signed: 01/08/2022 4:29:00 PM By: Carlene Coria RN Entered By: Carlene Coria on 01/08/2022 15:57:07
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
## 2022-01-09 DIAGNOSIS — R42 Dizziness and giddiness: Secondary | ICD-10-CM | POA: Diagnosis not present

## 2022-01-09 DIAGNOSIS — M47816 Spondylosis without myelopathy or radiculopathy, lumbar region: Secondary | ICD-10-CM | POA: Diagnosis not present

## 2022-01-09 DIAGNOSIS — M6281 Muscle weakness (generalized): Secondary | ICD-10-CM | POA: Diagnosis not present

## 2022-01-15 ENCOUNTER — Ambulatory Visit: Payer: Medicare Other | Admitting: Internal Medicine

## 2022-01-15 ENCOUNTER — Other Ambulatory Visit (INDEPENDENT_AMBULATORY_CARE_PROVIDER_SITE_OTHER): Payer: Self-pay | Admitting: Vascular Surgery

## 2022-01-16 ENCOUNTER — Other Ambulatory Visit (INDEPENDENT_AMBULATORY_CARE_PROVIDER_SITE_OTHER): Payer: Self-pay | Admitting: Vascular Surgery

## 2022-01-16 ENCOUNTER — Encounter (INDEPENDENT_AMBULATORY_CARE_PROVIDER_SITE_OTHER): Payer: Medicare Other | Admitting: Vascular Surgery

## 2022-01-16 ENCOUNTER — Encounter (INDEPENDENT_AMBULATORY_CARE_PROVIDER_SITE_OTHER): Payer: Medicare Other

## 2022-01-16 DIAGNOSIS — R29898 Other symptoms and signs involving the musculoskeletal system: Secondary | ICD-10-CM

## 2022-01-16 DIAGNOSIS — I739 Peripheral vascular disease, unspecified: Secondary | ICD-10-CM

## 2022-01-22 ENCOUNTER — Ambulatory Visit: Payer: Medicare Other | Admitting: Internal Medicine

## 2022-01-22 DIAGNOSIS — Z9889 Other specified postprocedural states: Secondary | ICD-10-CM | POA: Diagnosis not present

## 2022-01-22 DIAGNOSIS — S52501A Unspecified fracture of the lower end of right radius, initial encounter for closed fracture: Secondary | ICD-10-CM | POA: Diagnosis not present

## 2022-01-23 ENCOUNTER — Telehealth: Payer: Self-pay | Admitting: Neurology

## 2022-01-23 ENCOUNTER — Encounter: Payer: Self-pay | Admitting: Neurology

## 2022-01-23 DIAGNOSIS — S52571A Other intraarticular fracture of lower end of right radius, initial encounter for closed fracture: Secondary | ICD-10-CM | POA: Diagnosis not present

## 2022-01-23 DIAGNOSIS — Z9889 Other specified postprocedural states: Secondary | ICD-10-CM | POA: Diagnosis not present

## 2022-01-23 NOTE — Telephone Encounter (Signed)
Pt stated it was suggested by her Orthopedic Dr to ask about a Feraron infusion for low iron, please call

## 2022-01-23 NOTE — Telephone Encounter (Signed)
Called the pt back. There was no answer. Mailbox was full and was unable to LVM. I will send the pt a mychart message.

## 2022-01-24 DIAGNOSIS — Z23 Encounter for immunization: Secondary | ICD-10-CM | POA: Diagnosis not present

## 2022-01-27 DIAGNOSIS — G629 Polyneuropathy, unspecified: Secondary | ICD-10-CM | POA: Diagnosis not present

## 2022-01-27 DIAGNOSIS — R293 Abnormal posture: Secondary | ICD-10-CM | POA: Diagnosis not present

## 2022-01-27 DIAGNOSIS — H838X3 Other specified diseases of inner ear, bilateral: Secondary | ICD-10-CM | POA: Diagnosis not present

## 2022-01-27 DIAGNOSIS — H903 Sensorineural hearing loss, bilateral: Secondary | ICD-10-CM | POA: Diagnosis not present

## 2022-01-28 NOTE — Telephone Encounter (Signed)
Pt would like a call to discuss getting Feraron infusion and sooner appt with Dr. Brett Fairy for neuropathy.

## 2022-01-29 ENCOUNTER — Telehealth: Payer: Self-pay | Admitting: Neurology

## 2022-01-29 ENCOUNTER — Other Ambulatory Visit: Payer: Self-pay | Admitting: Neurology

## 2022-01-29 ENCOUNTER — Ambulatory Visit: Payer: Medicare Other | Admitting: Internal Medicine

## 2022-01-29 ENCOUNTER — Other Ambulatory Visit (INDEPENDENT_AMBULATORY_CARE_PROVIDER_SITE_OTHER): Payer: Self-pay

## 2022-01-29 DIAGNOSIS — G909 Disorder of the autonomic nervous system, unspecified: Secondary | ICD-10-CM

## 2022-01-29 DIAGNOSIS — D509 Iron deficiency anemia, unspecified: Secondary | ICD-10-CM

## 2022-01-29 DIAGNOSIS — G2581 Restless legs syndrome: Secondary | ICD-10-CM

## 2022-01-29 DIAGNOSIS — D501 Sideropenic dysphagia: Secondary | ICD-10-CM

## 2022-01-29 DIAGNOSIS — Z0289 Encounter for other administrative examinations: Secondary | ICD-10-CM

## 2022-01-29 DIAGNOSIS — R790 Abnormal level of blood mineral: Secondary | ICD-10-CM

## 2022-01-29 DIAGNOSIS — E611 Iron deficiency: Secondary | ICD-10-CM

## 2022-01-29 DIAGNOSIS — R296 Repeated falls: Secondary | ICD-10-CM

## 2022-01-29 NOTE — Telephone Encounter (Signed)
Pt said would come today to get lab today at 11 am. Can not walk, husband has to lift me. Would like a call from the nurse Pt said she would send a MyChart message also.

## 2022-01-29 NOTE — Telephone Encounter (Signed)
Sent to [place] with [facility] to see if she will be able to take the patient.
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
## 2022-01-29 NOTE — Telephone Encounter (Addendum)
Called the patient.  There was no answer.  Left a voicemail advising the pt that we would need to have repeat labs before deciding if medication is needed.  I will place her on the wait list if something opens up sooner with Dr. Brett Fairy.  In the meantime she can have the lab work completed to assess her iron and ferritin levels.  Advised the patient to reply to my MyChart message or contact the office.  **The patient returns the call, please advise that she will be on the wait list for Dr. Brett Fairy.  Please advise that she should call to have lab work drawn.  If she can give [place] a timeframe on when she will plan to come, I can have the order sent.

## 2022-01-29 NOTE — Telephone Encounter (Signed)
Pt came in for blood work and has new issues. She would like to be seen or have a My Chart video visit to discuss. She recently fell 3 weeks ago and broke wrist and this week she fell 3 more times. She can't lift her legs they are swollen and can't get to the bathroom without assistance. Her friend borught her over and asked about PT but she also mentioned if the Primary could send her to PT. She hasn't been to the Primary in about a month. Please give her a call to discuss. Thank you

## 2022-01-29 NOTE — Addendum Note (Signed)
Addended by: Darleen Crocker on: 01/29/2022 11:58 AM   Modules accepted: Orders

## 2022-01-30 ENCOUNTER — Telehealth: Payer: Self-pay | Admitting: Interventional Cardiology

## 2022-01-30 ENCOUNTER — Other Ambulatory Visit: Payer: Self-pay | Admitting: Neurology

## 2022-01-30 DIAGNOSIS — L0889 Other specified local infections of the skin and subcutaneous tissue: Secondary | ICD-10-CM | POA: Diagnosis not present

## 2022-01-30 DIAGNOSIS — S62617A Displaced fracture of proximal phalanx of left little finger, initial encounter for closed fracture: Secondary | ICD-10-CM | POA: Diagnosis not present

## 2022-01-30 DIAGNOSIS — I872 Venous insufficiency (chronic) (peripheral): Secondary | ICD-10-CM | POA: Diagnosis not present

## 2022-01-30 LAB — FERRITIN: Ferritin: 189 ng/mL — ABNORMAL HIGH (ref 15–150)

## 2022-01-30 LAB — IRON AND TIBC
Iron Saturation: 24 % (ref 15–55)
Iron: 50 ug/dL (ref 27–139)
Total Iron Binding Capacity: 205 ug/dL — ABNORMAL LOW (ref 250–450)
UIBC: 155 ug/dL (ref 118–369)

## 2022-01-30 NOTE — Telephone Encounter (Signed)
I have sent a mychart message following up with the pt.
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
## 2022-01-30 NOTE — Addendum Note (Signed)
Addended by: Larey Seat on: 01/30/2022 10:17 AM   Modules accepted: Orders

## 2022-01-30 NOTE — Telephone Encounter (Signed)
Call sent straight to triage. Patient c/o SOB, lightheadedness, and chest pain. This has been going on for several days. Informed patient that she needs to go to the ED. Patient refusing to go to the ED. Patient stated she wants to see Dr. Tamala Julian today or tomorrow. Informed patient that Dr. Tamala Julian does not have any available appointments today or tomorrow. Informed patient with her symptoms she needs to go to the ED or call 911. Patient then stated it is not that bad, but she needs to see Dr. Tamala Julian in the next couple of days. Repeated to patient to go to the ED for her symptoms and a message would be sent to Dr. Tamala Julian and his nurse. Patient stated she would like to hear back from Dr. Tamala Julian and his nurse.

## 2022-01-30 NOTE — Progress Notes (Signed)
Ferritin is not low- 189 ng/ml and iron saturation is in normal range. No IV iron is recommended as this time, oral iron can be used, .

## 2022-01-30 NOTE — Telephone Encounter (Signed)
[place] messaged me back stating they will be able to take her.

## 2022-01-30 NOTE — Telephone Encounter (Signed)
Pt c/o Shortness Of Breath: STAT if SOB developed within the last 24 hours or pt is noticeably SOB on the phone  1. Are you currently SOB (can you hear that pt is SOB on the phone)? yes  2. How long have you been experiencing SOB? A couple of days   3. Are you SOB when sitting or when up moving around? When moving around  4. Are you currently experiencing any other symptoms? Chest pain  Pt c/o of Chest Pain: STAT if CP now or developed within 24 hours  1. Are you having CP right now? Yes, feels heavy by her heart  2. Are you experiencing any other symptoms (ex. SOB, nausea, vomiting, sweating)? SOB  3. How long have you been experiencing CP? 2 Days  4. Is your CP continuous or coming and going? Comes and goes   5. Have you taken Nitroglycerin? no ?

## 2022-01-31 NOTE — Telephone Encounter (Signed)
Called patient and offered her an appointment on Monday with DOD, Dr. Caryl Comes. Patient stated her BP has been running low as well, SBP 80's. Encouraged patient to hold her bisoprolol, drink fluids, and have a small salty snack. Patient stated she only takes bisoprolol for headaches anyway. Encouraged patient to go to ED if symptoms get worse. Patient verbalized understanding.

## 2022-02-01 DIAGNOSIS — E785 Hyperlipidemia, unspecified: Secondary | ICD-10-CM | POA: Diagnosis not present

## 2022-02-01 DIAGNOSIS — S42301D Unspecified fracture of shaft of humerus, right arm, subsequent encounter for fracture with routine healing: Secondary | ICD-10-CM | POA: Diagnosis not present

## 2022-02-01 DIAGNOSIS — W901XXD Exposure to infrared radiation, subsequent encounter: Secondary | ICD-10-CM | POA: Diagnosis not present

## 2022-02-01 DIAGNOSIS — M4800 Spinal stenosis, site unspecified: Secondary | ICD-10-CM | POA: Diagnosis not present

## 2022-02-01 DIAGNOSIS — M542 Cervicalgia: Secondary | ICD-10-CM | POA: Diagnosis not present

## 2022-02-01 DIAGNOSIS — J45909 Unspecified asthma, uncomplicated: Secondary | ICD-10-CM | POA: Diagnosis not present

## 2022-02-01 DIAGNOSIS — G43B0 Ophthalmoplegic migraine, not intractable: Secondary | ICD-10-CM | POA: Diagnosis not present

## 2022-02-01 DIAGNOSIS — W19XXXD Unspecified fall, subsequent encounter: Secondary | ICD-10-CM | POA: Diagnosis not present

## 2022-02-01 DIAGNOSIS — T24332D Burn of third degree of left lower leg, subsequent encounter: Secondary | ICD-10-CM | POA: Diagnosis not present

## 2022-02-01 DIAGNOSIS — L89610 Pressure ulcer of right heel, unstageable: Secondary | ICD-10-CM | POA: Diagnosis not present

## 2022-02-01 DIAGNOSIS — G2581 Restless legs syndrome: Secondary | ICD-10-CM | POA: Diagnosis not present

## 2022-02-01 DIAGNOSIS — R296 Repeated falls: Secondary | ICD-10-CM | POA: Diagnosis not present

## 2022-02-01 DIAGNOSIS — G629 Polyneuropathy, unspecified: Secondary | ICD-10-CM | POA: Diagnosis not present

## 2022-02-01 DIAGNOSIS — E559 Vitamin D deficiency, unspecified: Secondary | ICD-10-CM | POA: Diagnosis not present

## 2022-02-01 DIAGNOSIS — T24331D Burn of third degree of right lower leg, subsequent encounter: Secondary | ICD-10-CM | POA: Diagnosis not present

## 2022-02-01 DIAGNOSIS — M519 Unspecified thoracic, thoracolumbar and lumbosacral intervertebral disc disorder: Secondary | ICD-10-CM | POA: Diagnosis not present

## 2022-02-01 DIAGNOSIS — M1712 Unilateral primary osteoarthritis, left knee: Secondary | ICD-10-CM | POA: Diagnosis not present

## 2022-02-01 DIAGNOSIS — I4891 Unspecified atrial fibrillation: Secondary | ICD-10-CM | POA: Diagnosis not present

## 2022-02-01 DIAGNOSIS — G901 Familial dysautonomia [Riley-Day]: Secondary | ICD-10-CM | POA: Diagnosis not present

## 2022-02-01 DIAGNOSIS — H53419 Scotoma involving central area, unspecified eye: Secondary | ICD-10-CM | POA: Diagnosis not present

## 2022-02-01 DIAGNOSIS — G249 Dystonia, unspecified: Secondary | ICD-10-CM | POA: Diagnosis not present

## 2022-02-01 DIAGNOSIS — G4733 Obstructive sleep apnea (adult) (pediatric): Secondary | ICD-10-CM | POA: Diagnosis not present

## 2022-02-01 DIAGNOSIS — K5790 Diverticulosis of intestine, part unspecified, without perforation or abscess without bleeding: Secondary | ICD-10-CM | POA: Diagnosis not present

## 2022-02-01 DIAGNOSIS — D501 Sideropenic dysphagia: Secondary | ICD-10-CM | POA: Diagnosis not present

## 2022-02-01 DIAGNOSIS — I6529 Occlusion and stenosis of unspecified carotid artery: Secondary | ICD-10-CM | POA: Diagnosis not present

## 2022-02-03 ENCOUNTER — Inpatient Hospital Stay (HOSPITAL_COMMUNITY)
Admission: AD | Admit: 2022-02-03 | Discharge: 2022-02-15 | DRG: 286 | Disposition: A | Discharge status: Rehabilitation Facility | Payer: Medicare Other | Source: Ambulatory Visit | Attending: Internal Medicine | Admitting: Internal Medicine

## 2022-02-03 ENCOUNTER — Encounter: Payer: Self-pay | Admitting: Internal Medicine

## 2022-02-03 ENCOUNTER — Ambulatory Visit (INDEPENDENT_AMBULATORY_CARE_PROVIDER_SITE_OTHER): Payer: Medicare Other | Admitting: Internal Medicine

## 2022-02-03 ENCOUNTER — Other Ambulatory Visit: Payer: Self-pay

## 2022-02-03 ENCOUNTER — Telehealth: Payer: Self-pay | Admitting: Internal Medicine

## 2022-02-03 ENCOUNTER — Inpatient Hospital Stay (HOSPITAL_COMMUNITY): Payer: Medicare Other

## 2022-02-03 VITALS — BP 106/50 | HR 82 | Ht 67.0 in | Wt 208.4 lb

## 2022-02-03 DIAGNOSIS — R42 Dizziness and giddiness: Secondary | ICD-10-CM | POA: Diagnosis not present

## 2022-02-03 DIAGNOSIS — R5381 Other malaise: Secondary | ICD-10-CM | POA: Diagnosis present

## 2022-02-03 DIAGNOSIS — Y92009 Unspecified place in unspecified non-institutional (private) residence as the place of occurrence of the external cause: Secondary | ICD-10-CM | POA: Diagnosis not present

## 2022-02-03 DIAGNOSIS — I951 Orthostatic hypotension: Secondary | ICD-10-CM | POA: Diagnosis present

## 2022-02-03 DIAGNOSIS — R296 Repeated falls: Secondary | ICD-10-CM | POA: Diagnosis present

## 2022-02-03 DIAGNOSIS — S52501D Unspecified fracture of the lower end of right radius, subsequent encounter for closed fracture with routine healing: Secondary | ICD-10-CM

## 2022-02-03 DIAGNOSIS — Z79899 Other long term (current) drug therapy: Secondary | ICD-10-CM

## 2022-02-03 DIAGNOSIS — Z6830 Body mass index (BMI) 30.0-30.9, adult: Secondary | ICD-10-CM

## 2022-02-03 DIAGNOSIS — Z96653 Presence of artificial knee joint, bilateral: Secondary | ICD-10-CM | POA: Diagnosis present

## 2022-02-03 DIAGNOSIS — H8113 Benign paroxysmal vertigo, bilateral: Secondary | ICD-10-CM | POA: Diagnosis present

## 2022-02-03 DIAGNOSIS — I5033 Acute on chronic diastolic (congestive) heart failure: Secondary | ICD-10-CM | POA: Diagnosis not present

## 2022-02-03 DIAGNOSIS — B49 Unspecified mycosis: Secondary | ICD-10-CM | POA: Diagnosis not present

## 2022-02-03 DIAGNOSIS — R0609 Other forms of dyspnea: Secondary | ICD-10-CM | POA: Diagnosis not present

## 2022-02-03 DIAGNOSIS — Z8679 Personal history of other diseases of the circulatory system: Secondary | ICD-10-CM | POA: Diagnosis not present

## 2022-02-03 DIAGNOSIS — M810 Age-related osteoporosis without current pathological fracture: Secondary | ICD-10-CM | POA: Diagnosis present

## 2022-02-03 DIAGNOSIS — M1990 Unspecified osteoarthritis, unspecified site: Secondary | ICD-10-CM | POA: Diagnosis not present

## 2022-02-03 DIAGNOSIS — R109 Unspecified abdominal pain: Secondary | ICD-10-CM | POA: Diagnosis not present

## 2022-02-03 DIAGNOSIS — K5901 Slow transit constipation: Secondary | ICD-10-CM | POA: Diagnosis present

## 2022-02-03 DIAGNOSIS — I130 Hypertensive heart and chronic kidney disease with heart failure and stage 1 through stage 4 chronic kidney disease, or unspecified chronic kidney disease: Secondary | ICD-10-CM | POA: Diagnosis not present

## 2022-02-03 DIAGNOSIS — R000 Tachycardia, unspecified: Secondary | ICD-10-CM | POA: Diagnosis present

## 2022-02-03 DIAGNOSIS — Z20822 Contact with and (suspected) exposure to covid-19: Secondary | ICD-10-CM | POA: Diagnosis not present

## 2022-02-03 DIAGNOSIS — E559 Vitamin D deficiency, unspecified: Secondary | ICD-10-CM | POA: Diagnosis present

## 2022-02-03 DIAGNOSIS — R778 Other specified abnormalities of plasma proteins: Secondary | ICD-10-CM | POA: Diagnosis not present

## 2022-02-03 DIAGNOSIS — N1831 Chronic kidney disease, stage 3a: Secondary | ICD-10-CM | POA: Diagnosis not present

## 2022-02-03 DIAGNOSIS — D649 Anemia, unspecified: Secondary | ICD-10-CM | POA: Diagnosis present

## 2022-02-03 DIAGNOSIS — N189 Chronic kidney disease, unspecified: Secondary | ICD-10-CM | POA: Diagnosis not present

## 2022-02-03 DIAGNOSIS — I639 Cerebral infarction, unspecified: Secondary | ICD-10-CM

## 2022-02-03 DIAGNOSIS — R0600 Dyspnea, unspecified: Secondary | ICD-10-CM | POA: Diagnosis not present

## 2022-02-03 DIAGNOSIS — W19XXXD Unspecified fall, subsequent encounter: Secondary | ICD-10-CM | POA: Diagnosis present

## 2022-02-03 DIAGNOSIS — G629 Polyneuropathy, unspecified: Secondary | ICD-10-CM | POA: Diagnosis present

## 2022-02-03 DIAGNOSIS — J90 Pleural effusion, not elsewhere classified: Secondary | ICD-10-CM | POA: Diagnosis not present

## 2022-02-03 DIAGNOSIS — R1012 Left upper quadrant pain: Secondary | ICD-10-CM | POA: Diagnosis not present

## 2022-02-03 DIAGNOSIS — K5900 Constipation, unspecified: Secondary | ICD-10-CM | POA: Diagnosis not present

## 2022-02-03 DIAGNOSIS — Z9884 Bariatric surgery status: Secondary | ICD-10-CM

## 2022-02-03 DIAGNOSIS — S62511A Displaced fracture of proximal phalanx of right thumb, initial encounter for closed fracture: Secondary | ICD-10-CM | POA: Diagnosis present

## 2022-02-03 DIAGNOSIS — E876 Hypokalemia: Secondary | ICD-10-CM | POA: Diagnosis present

## 2022-02-03 DIAGNOSIS — G4733 Obstructive sleep apnea (adult) (pediatric): Secondary | ICD-10-CM

## 2022-02-03 DIAGNOSIS — R0602 Shortness of breath: Secondary | ICD-10-CM | POA: Diagnosis not present

## 2022-02-03 DIAGNOSIS — I2722 Pulmonary hypertension due to left heart disease: Secondary | ICD-10-CM | POA: Diagnosis present

## 2022-02-03 DIAGNOSIS — E854 Organ-limited amyloidosis: Secondary | ICD-10-CM | POA: Diagnosis not present

## 2022-02-03 DIAGNOSIS — E66811 Obesity, class 1: Secondary | ICD-10-CM | POA: Diagnosis present

## 2022-02-03 DIAGNOSIS — X088XXS Exposure to other specified smoke, fire and flames, sequela: Secondary | ICD-10-CM | POA: Diagnosis present

## 2022-02-03 DIAGNOSIS — I480 Paroxysmal atrial fibrillation: Secondary | ICD-10-CM

## 2022-02-03 DIAGNOSIS — E669 Obesity, unspecified: Secondary | ICD-10-CM | POA: Diagnosis present

## 2022-02-03 DIAGNOSIS — G2581 Restless legs syndrome: Secondary | ICD-10-CM | POA: Diagnosis not present

## 2022-02-03 DIAGNOSIS — B3749 Other urogenital candidiasis: Secondary | ICD-10-CM | POA: Diagnosis present

## 2022-02-03 DIAGNOSIS — I10 Essential (primary) hypertension: Secondary | ICD-10-CM | POA: Diagnosis not present

## 2022-02-03 DIAGNOSIS — N184 Chronic kidney disease, stage 4 (severe): Secondary | ICD-10-CM | POA: Diagnosis not present

## 2022-02-03 DIAGNOSIS — I2729 Other secondary pulmonary hypertension: Secondary | ICD-10-CM | POA: Diagnosis not present

## 2022-02-03 DIAGNOSIS — N390 Urinary tract infection, site not specified: Secondary | ICD-10-CM | POA: Diagnosis not present

## 2022-02-03 DIAGNOSIS — Z7982 Long term (current) use of aspirin: Secondary | ICD-10-CM | POA: Diagnosis not present

## 2022-02-03 DIAGNOSIS — S81802S Unspecified open wound, left lower leg, sequela: Secondary | ICD-10-CM | POA: Diagnosis not present

## 2022-02-03 DIAGNOSIS — L03119 Cellulitis of unspecified part of limb: Secondary | ICD-10-CM | POA: Diagnosis not present

## 2022-02-03 DIAGNOSIS — N179 Acute kidney failure, unspecified: Secondary | ICD-10-CM | POA: Diagnosis not present

## 2022-02-03 DIAGNOSIS — R52 Pain, unspecified: Secondary | ICD-10-CM

## 2022-02-03 DIAGNOSIS — W92XXXD Exposure to excessive heat of man-made origin, subsequent encounter: Secondary | ICD-10-CM

## 2022-02-03 DIAGNOSIS — G909 Disorder of the autonomic nervous system, unspecified: Secondary | ICD-10-CM | POA: Diagnosis not present

## 2022-02-03 DIAGNOSIS — Z8249 Family history of ischemic heart disease and other diseases of the circulatory system: Secondary | ICD-10-CM

## 2022-02-03 DIAGNOSIS — M545 Low back pain, unspecified: Secondary | ICD-10-CM | POA: Diagnosis not present

## 2022-02-03 DIAGNOSIS — Z96611 Presence of right artificial shoulder joint: Secondary | ICD-10-CM | POA: Diagnosis present

## 2022-02-03 DIAGNOSIS — D509 Iron deficiency anemia, unspecified: Secondary | ICD-10-CM | POA: Diagnosis not present

## 2022-02-03 DIAGNOSIS — R079 Chest pain, unspecified: Secondary | ICD-10-CM | POA: Diagnosis not present

## 2022-02-03 DIAGNOSIS — I5032 Chronic diastolic (congestive) heart failure: Secondary | ICD-10-CM | POA: Diagnosis not present

## 2022-02-03 DIAGNOSIS — E785 Hyperlipidemia, unspecified: Secondary | ICD-10-CM | POA: Diagnosis present

## 2022-02-03 DIAGNOSIS — Z9181 History of falling: Secondary | ICD-10-CM | POA: Diagnosis not present

## 2022-02-03 DIAGNOSIS — T368X5A Adverse effect of other systemic antibiotics, initial encounter: Secondary | ICD-10-CM | POA: Diagnosis present

## 2022-02-03 DIAGNOSIS — M542 Cervicalgia: Secondary | ICD-10-CM | POA: Diagnosis not present

## 2022-02-03 DIAGNOSIS — K219 Gastro-esophageal reflux disease without esophagitis: Secondary | ICD-10-CM | POA: Diagnosis present

## 2022-02-03 DIAGNOSIS — H8110 Benign paroxysmal vertigo, unspecified ear: Secondary | ICD-10-CM | POA: Diagnosis not present

## 2022-02-03 DIAGNOSIS — I350 Nonrheumatic aortic (valve) stenosis: Secondary | ICD-10-CM | POA: Diagnosis present

## 2022-02-03 DIAGNOSIS — R933 Abnormal findings on diagnostic imaging of other parts of digestive tract: Secondary | ICD-10-CM | POA: Diagnosis not present

## 2022-02-03 DIAGNOSIS — Z803 Family history of malignant neoplasm of breast: Secondary | ICD-10-CM

## 2022-02-03 DIAGNOSIS — T24391D Burn of third degree of multiple sites of right lower limb, except ankle and foot, subsequent encounter: Secondary | ICD-10-CM

## 2022-02-03 DIAGNOSIS — Z8673 Personal history of transient ischemic attack (TIA), and cerebral infarction without residual deficits: Secondary | ICD-10-CM

## 2022-02-03 DIAGNOSIS — I272 Pulmonary hypertension, unspecified: Secondary | ICD-10-CM | POA: Diagnosis not present

## 2022-02-03 DIAGNOSIS — M792 Neuralgia and neuritis, unspecified: Secondary | ICD-10-CM | POA: Diagnosis not present

## 2022-02-03 DIAGNOSIS — J9811 Atelectasis: Secondary | ICD-10-CM | POA: Diagnosis not present

## 2022-02-03 DIAGNOSIS — I5031 Acute diastolic (congestive) heart failure: Secondary | ICD-10-CM | POA: Diagnosis not present

## 2022-02-03 DIAGNOSIS — D631 Anemia in chronic kidney disease: Secondary | ICD-10-CM | POA: Diagnosis not present

## 2022-02-03 DIAGNOSIS — N1411 Contrast-induced nephropathy: Secondary | ICD-10-CM | POA: Diagnosis not present

## 2022-02-03 DIAGNOSIS — R339 Retention of urine, unspecified: Secondary | ICD-10-CM | POA: Diagnosis not present

## 2022-02-03 DIAGNOSIS — E782 Mixed hyperlipidemia: Secondary | ICD-10-CM | POA: Diagnosis not present

## 2022-02-03 DIAGNOSIS — S81801S Unspecified open wound, right lower leg, sequela: Secondary | ICD-10-CM | POA: Diagnosis not present

## 2022-02-03 DIAGNOSIS — Z800 Family history of malignant neoplasm of digestive organs: Secondary | ICD-10-CM

## 2022-02-03 DIAGNOSIS — G4730 Sleep apnea, unspecified: Secondary | ICD-10-CM | POA: Diagnosis not present

## 2022-02-03 DIAGNOSIS — R112 Nausea with vomiting, unspecified: Secondary | ICD-10-CM | POA: Diagnosis not present

## 2022-02-03 DIAGNOSIS — Z888 Allergy status to other drugs, medicaments and biological substances status: Secondary | ICD-10-CM

## 2022-02-03 DIAGNOSIS — G63 Polyneuropathy in diseases classified elsewhere: Secondary | ICD-10-CM | POA: Diagnosis not present

## 2022-02-03 DIAGNOSIS — T508X5A Adverse effect of diagnostic agents, initial encounter: Secondary | ICD-10-CM | POA: Diagnosis not present

## 2022-02-03 LAB — CBC WITH DIFFERENTIAL/PLATELET
Abs Immature Granulocytes: 0.02 10*3/uL (ref 0.00–0.07)
Basophils Absolute: 0.1 10*3/uL (ref 0.0–0.1)
Basophils Relative: 1 %
Eosinophils Absolute: 0.1 10*3/uL (ref 0.0–0.5)
Eosinophils Relative: 1 %
HCT: 26.9 % — ABNORMAL LOW (ref 36.0–46.0)
Hemoglobin: 8.4 g/dL — ABNORMAL LOW (ref 12.0–15.0)
Immature Granulocytes: 0 %
Lymphocytes Relative: 16 %
Lymphs Abs: 1.1 10*3/uL (ref 0.7–4.0)
MCH: 31.6 pg (ref 26.0–34.0)
MCHC: 31.2 g/dL (ref 30.0–36.0)
MCV: 101.1 fL — ABNORMAL HIGH (ref 80.0–100.0)
Monocytes Absolute: 0.5 10*3/uL (ref 0.1–1.0)
Monocytes Relative: 7 %
Neutro Abs: 5.5 10*3/uL (ref 1.7–7.7)
Neutrophils Relative %: 75 %
Platelets: 315 10*3/uL (ref 150–400)
RBC: 2.66 MIL/uL — ABNORMAL LOW (ref 3.87–5.11)
RDW: 14.7 % (ref 11.5–15.5)
WBC: 7.3 10*3/uL (ref 4.0–10.5)
nRBC: 0 % (ref 0.0–0.2)

## 2022-02-03 LAB — COMPREHENSIVE METABOLIC PANEL
ALT: 18 U/L (ref 0–44)
AST: 16 U/L (ref 15–41)
Albumin: 2.3 g/dL — ABNORMAL LOW (ref 3.5–5.0)
Alkaline Phosphatase: 94 U/L (ref 38–126)
Anion gap: 11 (ref 5–15)
BUN: 29 mg/dL — ABNORMAL HIGH (ref 8–23)
CO2: 19 mmol/L — ABNORMAL LOW (ref 22–32)
Calcium: 7.9 mg/dL — ABNORMAL LOW (ref 8.9–10.3)
Chloride: 106 mmol/L (ref 98–111)
Creatinine, Ser: 1.74 mg/dL — ABNORMAL HIGH (ref 0.44–1.00)
GFR, Estimated: 30 mL/min — ABNORMAL LOW (ref >60)
Glucose, Bld: 95 mg/dL (ref 70–99)
Potassium: 3.3 mmol/L — ABNORMAL LOW (ref 3.5–5.1)
Sodium: 136 mmol/L (ref 135–145)
Total Bilirubin: 0.1 mg/dL — ABNORMAL LOW (ref 0.3–1.2)
Total Protein: 5.6 g/dL — ABNORMAL LOW (ref 6.5–8.1)

## 2022-02-03 LAB — TRANSFERRIN SATURATION
IRON SATN MFR SERPL: 21 % Saturation
IRON SERPL-MCNC: 55 ug/dL
TRANSFERRIN SERPL-MCNC: 189 mg/dL — ABNORMAL LOW

## 2022-02-03 LAB — TROPONIN I (HIGH SENSITIVITY)
Troponin I (High Sensitivity): 70 ng/L — ABNORMAL HIGH (ref <18)
Troponin I (High Sensitivity): 79 ng/L — ABNORMAL HIGH (ref <18)

## 2022-02-03 LAB — MAGNESIUM: Magnesium: 2.1 mg/dL (ref 1.7–2.4)

## 2022-02-03 LAB — SPECIMEN STATUS REPORT

## 2022-02-03 LAB — BRAIN NATRIURETIC PEPTIDE: B Natriuretic Peptide: 841.9 pg/mL — ABNORMAL HIGH (ref 0.0–100.0)

## 2022-02-03 MED ORDER — AMPHETAMINE-DEXTROAMPHETAMINE 10 MG PO TABS: 10.0000 mg | ORAL_TABLET | Freq: Two times a day (BID) | ORAL | Status: DC

## 2022-02-03 MED ORDER — HYDROCODONE-ACETAMINOPHEN 7.5-325 MG PO TABS
1.0000 | ORAL_TABLET | Freq: Four times a day (QID) | ORAL | Status: DC
  Administered 2022-02-03 – 2022-02-15 (×41): 1 via ORAL
  Filled 2022-02-03 (×43): qty 1

## 2022-02-03 MED ORDER — TRIAMCINOLONE ACETONIDE 0.1 % EX OINT
1.0000 "application " | TOPICAL_OINTMENT | Freq: Two times a day (BID) | CUTANEOUS | Status: DC
  Administered 2022-02-04 – 2022-02-14 (×18): 1 via TOPICAL
  Filled 2022-02-03: qty 15

## 2022-02-03 MED ORDER — SULFAMETHOXAZOLE-TRIMETHOPRIM 800-160 MG PO TABS
1.0000 | ORAL_TABLET | Freq: Two times a day (BID) | ORAL | Status: DC
  Administered 2022-02-03 – 2022-02-05 (×5): 1 via ORAL
  Filled 2022-02-03 (×7): qty 1

## 2022-02-03 MED ORDER — GABAPENTIN 300 MG PO CAPS
300.0000 mg | ORAL_CAPSULE | Freq: Three times a day (TID) | ORAL | Status: DC
  Administered 2022-02-03 – 2022-02-09 (×16): 300 mg via ORAL
  Filled 2022-02-03 (×18): qty 1

## 2022-02-03 MED ORDER — DULOXETINE HCL 60 MG PO CPEP
60.0000 mg | ORAL_CAPSULE | Freq: Two times a day (BID) | ORAL | Status: DC
  Administered 2022-02-03 – 2022-02-15 (×22): 60 mg via ORAL
  Filled 2022-02-03 (×22): qty 1

## 2022-02-03 MED ORDER — ROPINIROLE HCL 1 MG PO TABS
2.0000 mg | ORAL_TABLET | Freq: Two times a day (BID) | ORAL | Status: DC
  Administered 2022-02-03 – 2022-02-14 (×20): 2 mg via ORAL
  Filled 2022-02-03 (×23): qty 2

## 2022-02-03 MED ORDER — PANTOPRAZOLE SODIUM 40 MG PO TBEC: 40.0000 mg | DELAYED_RELEASE_TABLET | Freq: Two times a day (BID) | ORAL | Status: DC

## 2022-02-03 MED ORDER — DICLOFENAC SODIUM 1 % TD GEL: 4.0000 g | Freq: Four times a day (QID) | TRANSDERMAL | Status: DC

## 2022-02-03 MED ORDER — CALCIUM CARBONATE 1250 (500 CA) MG PO TABS
1.0000 | ORAL_TABLET | Freq: Every day | ORAL | Status: DC
  Administered 2022-02-03 – 2022-02-15 (×10): 500 mg via ORAL
  Filled 2022-02-03 (×12): qty 1

## 2022-02-03 MED ORDER — ONDANSETRON 4 MG PO TBDP
4.0000 mg | ORAL_TABLET | Freq: Three times a day (TID) | ORAL | Status: DC | PRN
  Filled 2022-02-03: qty 1

## 2022-02-03 MED ORDER — ALBUTEROL SULFATE HFA 108 (90 BASE) MCG/ACT IN AERS
2.0000 | INHALATION_SPRAY | RESPIRATORY_TRACT | Status: DC | PRN
Start: 2022-02-03 — End: 2022-02-03

## 2022-02-03 MED ORDER — HEPARIN (PORCINE) 25000 UT/250ML-% IV SOLN
1750.0000 [IU]/h | INTRAVENOUS | Status: DC
  Administered 2022-02-03: 1500 [IU]/h via INTRAVENOUS
  Administered 2022-02-04 (×2): 1750 [IU]/h via INTRAVENOUS
  Filled 2022-02-03 (×3): qty 250

## 2022-02-03 MED ORDER — DULOXETINE HCL 60 MG PO CPEP
60.0000 mg | ORAL_CAPSULE | Freq: Every day | ORAL | Status: DC
  Filled 2022-02-03: qty 1

## 2022-02-03 MED ORDER — HEPARIN BOLUS VIA INFUSION
5500.0000 [IU] | Freq: Once | INTRAVENOUS | Status: AC
  Administered 2022-02-03: 5500 [IU] via INTRAVENOUS
  Filled 2022-02-03: qty 5500

## 2022-02-03 MED ORDER — GABAPENTIN 300 MG PO CAPS: 900.0000 mg | ORAL_CAPSULE | Freq: Every day | ORAL | Status: DC

## 2022-02-03 MED ORDER — BACLOFEN 10 MG PO TABS
20.0000 mg | ORAL_TABLET | Freq: Every day | ORAL | Status: DC
  Administered 2022-02-03 – 2022-02-09 (×7): 20 mg via ORAL
  Filled 2022-02-03 (×7): qty 2

## 2022-02-03 MED ORDER — PANTOPRAZOLE SODIUM 40 MG PO TBEC
80.0000 mg | DELAYED_RELEASE_TABLET | Freq: Every day | ORAL | Status: DC
Start: 2022-02-03 — End: 2022-02-03
  Filled 2022-02-03: qty 2

## 2022-02-03 MED ORDER — BACLOFEN 10 MG PO TABS
10.0000 mg | ORAL_TABLET | Freq: Two times a day (BID) | ORAL | Status: DC
  Filled 2022-02-03: qty 1

## 2022-02-03 MED ORDER — ASPIRIN 81 MG PO CHEW
81.0000 mg | CHEWABLE_TABLET | Freq: Every day | ORAL | Status: DC
  Administered 2022-02-03 – 2022-02-14 (×11): 81 mg via ORAL
  Filled 2022-02-03 (×12): qty 1

## 2022-02-03 MED ORDER — DICLOFENAC SODIUM 1 % EX GEL
4.0000 g | Freq: Every day | CUTANEOUS | Status: DC | PRN
  Filled 2022-02-03: qty 100

## 2022-02-03 MED ORDER — ROPINIROLE HCL 1 MG PO TABS
1.0000 mg | ORAL_TABLET | Freq: Every day | ORAL | Status: DC
  Administered 2022-02-04 – 2022-02-15 (×11): 1 mg via ORAL
  Filled 2022-02-03 (×12): qty 1

## 2022-02-03 MED ORDER — ALBUTEROL SULFATE (2.5 MG/3ML) 0.083% IN NEBU: 2.5000 mg | INHALATION_SOLUTION | RESPIRATORY_TRACT | Status: DC | PRN

## 2022-02-03 MED ORDER — FUROSEMIDE 40 MG PO TABS: 40.0000 mg | ORAL_TABLET | Freq: Every day | ORAL | Status: DC

## 2022-02-03 MED ORDER — GABAPENTIN 300 MG PO CAPS: 300.0000 mg | ORAL_CAPSULE | Freq: Every day | ORAL | Status: DC

## 2022-02-03 MED ORDER — HYDROCODONE-ACETAMINOPHEN 10-325 MG PO TABS: 1.0000 | ORAL_TABLET | ORAL | Status: DC | PRN

## 2022-02-03 MED ORDER — AMPHETAMINE-DEXTROAMPHETAMINE 10 MG PO TABS
10.0000 mg | ORAL_TABLET | Freq: Every day | ORAL | Status: DC
  Administered 2022-02-04 – 2022-02-11 (×8): 10 mg via ORAL
  Filled 2022-02-03 (×9): qty 1

## 2022-02-03 MED ORDER — VITAMIN D (ERGOCALCIFEROL) 1.25 MG (50000 UNIT) PO CAPS
50000.0000 [IU] | ORAL_CAPSULE | ORAL | Status: DC
  Filled 2022-02-03 (×4): qty 1

## 2022-02-03 MED ORDER — CALCIUM CARBONATE 1250 (500 CA) MG PO CHEW: 1250.0000 mg | CHEWABLE_TABLET | Freq: Every day | ORAL | Status: DC

## 2022-02-03 MED ORDER — BISOPROLOL FUMARATE 5 MG PO TABS
10.0000 mg | ORAL_TABLET | Freq: Every day | ORAL | Status: DC
  Administered 2022-02-04: 10 mg via ORAL
  Filled 2022-02-03: qty 2

## 2022-02-03 MED ORDER — PANTOPRAZOLE SODIUM 40 MG PO TBEC
40.0000 mg | DELAYED_RELEASE_TABLET | Freq: Two times a day (BID) | ORAL | Status: DC
  Administered 2022-02-03 – 2022-02-15 (×23): 40 mg via ORAL
  Filled 2022-02-03 (×22): qty 1

## 2022-02-03 NOTE — Progress Notes (Addendum)
ANTICOAGULATION CONSULT NOTE - Initial Consult  Pharmacy Consult for heparin  Indication: pulmonary embolus  Allergies  Allergen Reactions   Atorvastatin     Other reaction(s): myalgia   Crestor [Rosuvastatin]     Other reaction(s): myalgia   Ezetimibe     Other reaction(s): myalgia   Amitriptyline Rash   Heparin dosing wt: 89kg  Vital Signs: BP: 106/50 (02/20 0827) Pulse Rate: 82 (02/20 0827)  Labs: No results for input(s): HGB, HCT, PLT, APTT, LABPROT, INR, HEPARINUNFRC, HEPRLOWMOCWT, CREATININE, CKTOTAL, CKMB, TROPONINIHS in the last 72 hours.  CrCl cannot be calculated (Patient's most recent lab result is older than the maximum 21 days allowed.).   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   Arthritis    generalized   Asthma    Atrophic vaginitis    Back pain    DDD/stenosis   Carotid stenosis    Carotid US 10/16: Plaque RICA (3-54%), normal LICA   Depression    takes Cymbalta daily   Diverticulosis    benign   DJD (degenerative joint disease)    Dyslipidemia    Dysrhythmia    Family history of GI bleeding    GERD (gastroesophageal reflux disease)    takes Nexium daily   Gestational diabetes    Pt denies   H/O hiatal hernia    surgery for hernia   Headache(784.0)    takes Imitrex daily as needed and Bisoprolol daily;last migraine was about 2wks ago   History of bronchitis 1 yr ago   History of shingles    Insomnia    takes Ambien nightly   Joint pain    Joint swelling    Leg cramps    takes Flexeril daily as needed   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      ASPIRIN 81 PO Take 81 mg by mouth at bedtime.      baclofen (LIORESAL) 10 MG tablet TAKE 1 TABLET(10 MG) BY MOUTH TWICE DAILY 60 tablet 0    Baclofen 5 MG TABS Take 1 tablet by mouth daily.      bisoprolol (ZEBETA) 10 MG tablet Take 10 mg by mouth daily.      calcium carbonate (OS-CAL) 1250 (500 CA) MG chewable tablet Chew 1 tablet by mouth daily. Not sure of dosage      diclofenac sodium (VOLTAREN) 1 % GEL Apply 4 g topically 4 (four) times daily. 100 g 11    DULoxetine (CYMBALTA) 60 MG capsule Take 60 mg by mouth at bedtime.  11    Erenumab-aooe (AIMOVIG) 140 MG/ML SOAJ Inject 140 mg into the skin every 30 (thirty) days. (Patient not taking: Reported on 02/03/2022) 1 mL 1    esomeprazole (NEXIUM) 40 MG capsule Take 40 mg  by mouth 2 (two) times daily before a meal.   6    Evolocumab (REPATHA SURECLICK) 497 MG/ML SOAJ Inject 1 pen into the skin every 14 (fourteen) days. (Patient not taking: Reported on 02/03/2022) 2 mL 11    furosemide (LASIX) 20 MG tablet Take 40 mg by mouth daily as needed for fluid or edema.      gabapentin (NEURONTIN) 300 MG capsule Take 1 capsule (300 mg total) by mouth 4 (four) times daily. (Patient taking differently: Take 300-900 mg by mouth 3 (three) times daily. 300 mg at 4 pm and 900 mg at bedtime) 120 capsule 5    HYDROcodone-acetaminophen (NORCO) 10-325 MG tablet Take 1-2 tablets by mouth every 4 (four) hours as needed for moderate pain or severe pain. Takes for migraines 30 tablet 0    Loratadine 10 MG CAPS Take 1 capsule by mouth as needed (for allergies).       ondansetron (ZOFRAN-ODT) 4 MG disintegrating tablet DISSOLVE 1 TABLET(4 MG) ON THE TONGUE EVERY 8 HOURS AS NEEDED FOR NAUSEA OR VOMITING 45 tablet 2    PLENVU 140 g SOLR See admin instructions.       PROAIR HFA 108 (90 Base) MCG/ACT inhaler 2 INHALATIONS EVERY 4 HOURS AS NEEDED FOR ASTHMA SYMPTOMS (WHEEZING/SHORTNESS OF BREATH)  5    quiNINE (QUALAQUIN) 324 MG capsule TAKE ONE CAPSULE BY MOUTH TWICE A DAY (Patient not taking: Reported on 02/03/2022) 60 capsule 3    rOPINIRole (REQUIP) 1 MG tablet 1 tablet in the morning, 2 tablets in mid afternoon and in the evening 450 tablet 3    sulfamethoxazole-trimethoprim (BACTRIM DS) 800-160 MG tablet Take 1 tablet by mouth 2 (two) times daily.      triamcinolone ointment (KENALOG) 0.1 % Apply topically 2 (two) times daily.      Vitamin D, Ergocalciferol, (DRISDOL) 1.25 MG (50000 UNIT) CAPS capsule Take 1 capsule (50,000 Units total) by mouth every 7 (seven) days. 13 capsule 1       Assessment: 76 yo female with possible PE to start heparin. No anticoagulants noted PTA  Goal of Therapy:  Heparin level 0.3-0.7 units/ml Monitor platelets by anticoagulation protocol: Yes   Plan:  -Heparin bolus 5500 units IV followed by 1500 units/hr  -Heparin level in 8 hours and daily wth CBC daily  Hildred Laser, PharmD Clinical Pharmacist **Pharmacist phone directory can now be found on [email or web address] ([email or web address]).  Listed under [place].

## 2022-02-03 NOTE — Patient Instructions (Signed)
Medication Instructions:  Your physician recommends that you continue on your current medications as directed. Please refer to the Current Medication list given to you today.  *If you need a refill on your cardiac medications before your next appointment, please call your pharmacy*   Lab Work: None ordered.  If you have labs (blood work) drawn today and your tests are completely normal, you will receive your results only by: [place] (if you have MyChart) OR A paper copy in the mail If you have any lab test that is abnormal or we need to change your treatment, we will call you to review the results.   Testing/Procedures: Direct Admission to [facility] will be contacted once a bed is available   Follow-Up: At [facility], you and your health needs are our priority.  As part of our continuing mission to provide you with exceptional heart care, we have created designated Provider Care Teams.  These Care Teams include your primary Cardiologist (physician) and Advanced Practice Providers (APPs -  Physician Assistants and Nurse Practitioners) who all work together to provide you with the care you need, when you need it.  We recommend signing up for the patient portal called "MyChart".  Sign up information is provided on this After Visit Summary.  MyChart is used to connect with patients for Virtual Visits (Telemedicine).  Patients are able to view lab/test results, encounter notes, upcoming appointments, etc.  Non-urgent messages can be sent to your provider as well.   To learn more about what you can do with MyChart, go to [email or web address].    Your next appointment:   To be determined

## 2022-02-03 NOTE — Progress Notes (Signed)
Request for IV team to start IV but patient has no doctor's orders at this time.  Aniceto Boss, RN aware to re enter consult once orders are on the chart.

## 2022-02-03 NOTE — Telephone Encounter (Signed)
Pt's bed is ready for direct admit.  Spoke with pt and advised her bed is ready and she will need to report to admitting at [facility].  Pt verbalizes understanding and agrees with current plan.
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
## 2022-02-03 NOTE — Progress Notes (Signed)
A lower transferritin level indicates iron overload/ supply.  Transferrin will be higher in iron deficiency. No iv iron at this time, and stop oral iron for 8 weeks, until we check again.  Please add vitamin C to your supplements.
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
## 2022-02-03 NOTE — Progress Notes (Signed)
Pt arrived to the unit MD made aware.
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
## 2022-02-04 ENCOUNTER — Inpatient Hospital Stay (HOSPITAL_COMMUNITY): Payer: Medicare Other

## 2022-02-04 ENCOUNTER — Telehealth: Payer: Self-pay | Admitting: Neurology

## 2022-02-04 DIAGNOSIS — R0609 Other forms of dyspnea: Secondary | ICD-10-CM

## 2022-02-04 DIAGNOSIS — I5031 Acute diastolic (congestive) heart failure: Secondary | ICD-10-CM

## 2022-02-04 DIAGNOSIS — R778 Other specified abnormalities of plasma proteins: Secondary | ICD-10-CM

## 2022-02-04 DIAGNOSIS — Z9181 History of falling: Secondary | ICD-10-CM

## 2022-02-04 DIAGNOSIS — N179 Acute kidney failure, unspecified: Secondary | ICD-10-CM

## 2022-02-04 DIAGNOSIS — R079 Chest pain, unspecified: Secondary | ICD-10-CM

## 2022-02-04 DIAGNOSIS — R42 Dizziness and giddiness: Secondary | ICD-10-CM | POA: Diagnosis not present

## 2022-02-04 DIAGNOSIS — W19XXXA Unspecified fall, initial encounter: Secondary | ICD-10-CM

## 2022-02-04 LAB — BASIC METABOLIC PANEL
Anion gap: 11 (ref 5–15)
Anion gap: 12 (ref 5–15)
BUN: 34 mg/dL — ABNORMAL HIGH (ref 8–23)
BUN: 32 mg/dL — ABNORMAL HIGH (ref 8–23)
CO2: 18 mmol/L — ABNORMAL LOW (ref 22–32)
CO2: 19 mmol/L — ABNORMAL LOW (ref 22–32)
Calcium: 8.1 mg/dL — ABNORMAL LOW (ref 8.9–10.3)
Calcium: 8.4 mg/dL — ABNORMAL LOW (ref 8.9–10.3)
Chloride: 103 mmol/L (ref 98–111)
Chloride: 104 mmol/L (ref 98–111)
Creatinine, Ser: 1.93 mg/dL — ABNORMAL HIGH (ref 0.44–1.00)
Creatinine, Ser: 1.96 mg/dL — ABNORMAL HIGH (ref 0.44–1.00)
GFR, Estimated: 27 mL/min — ABNORMAL LOW (ref >60)
GFR, Estimated: 26 mL/min — ABNORMAL LOW (ref >60)
Glucose, Bld: 99 mg/dL (ref 70–99)
Glucose, Bld: 100 mg/dL — ABNORMAL HIGH (ref 70–99)
Potassium: 3.7 mmol/L (ref 3.5–5.1)
Potassium: 3.5 mmol/L (ref 3.5–5.1)
Sodium: 132 mmol/L — ABNORMAL LOW (ref 135–145)
Sodium: 135 mmol/L (ref 135–145)

## 2022-02-04 LAB — ECHOCARDIOGRAM COMPLETE
AR max vel: 1.45 cm2
AV Area VTI: 1.44 cm2
AV Area mean vel: 1.42 cm2
AV Mean grad: 15.3 mmHg
AV Peak grad: 25.2 mmHg
Ao pk vel: 2.51 m/s
Area-P 1/2: 4.15 cm2
Calc EF: 58 %
Height: 67 in
MV M vel: 4.22 m/s
MV Peak grad: 71.2 mmHg
P 1/2 time: 257 msec
S' Lateral: 2.82 cm
Single Plane A2C EF: 58.6 %
Single Plane A4C EF: 58.1 %
Weight: 3299.2 oz

## 2022-02-04 LAB — CBC
HCT: 27.2 % — ABNORMAL LOW (ref 36.0–46.0)
Hemoglobin: 8.9 g/dL — ABNORMAL LOW (ref 12.0–15.0)
MCH: 32.6 pg (ref 26.0–34.0)
MCHC: 32.7 g/dL (ref 30.0–36.0)
MCV: 99.6 fL (ref 80.0–100.0)
Platelets: 304 10*3/uL (ref 150–400)
RBC: 2.73 MIL/uL — ABNORMAL LOW (ref 3.87–5.11)
RDW: 14.9 % (ref 11.5–15.5)
WBC: 7.9 10*3/uL (ref 4.0–10.5)
nRBC: 0 % (ref 0.0–0.2)

## 2022-02-04 LAB — HEPARIN LEVEL (UNFRACTIONATED)
Heparin Unfractionated: 0.24 IU/mL — ABNORMAL LOW (ref 0.30–0.70)
Heparin Unfractionated: 0.41 IU/mL (ref 0.30–0.70)
Heparin Unfractionated: 0.46 IU/mL (ref 0.30–0.70)

## 2022-02-04 IMAGING — CT CT ANGIO CHEST
2 of 6 series · 18 of 36 positions shown · IV contrast (agent unspecified)
Comparison: None.

CLINICAL DATA: Difficulty breathing

EXAM:
CT ANGIOGRAPHY CHEST WITH CONTRAST
TECHNIQUE: Multidetector CT imaging of the chest was performed using the
standard protocol during bolus administration of intravenous
contrast. Multiplanar CT image reconstructions and MIPs were
obtained to evaluate the vascular anatomy.

[Series 7: pe thins · axial · 0.89mm/px · z∈[-279,-40]mm · 17 of 382 slices shown]
[im 20/382  lung]
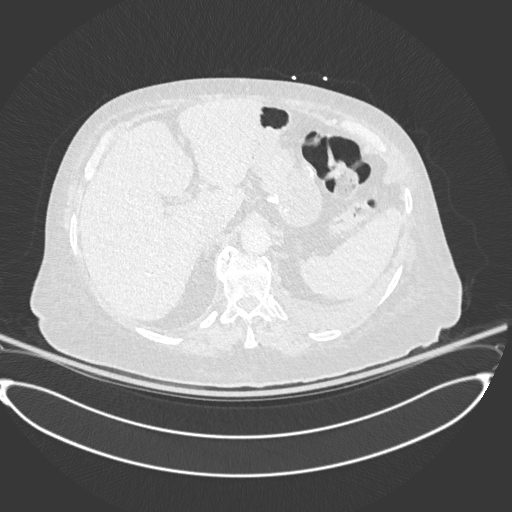
[im 39/382  mediastinal]
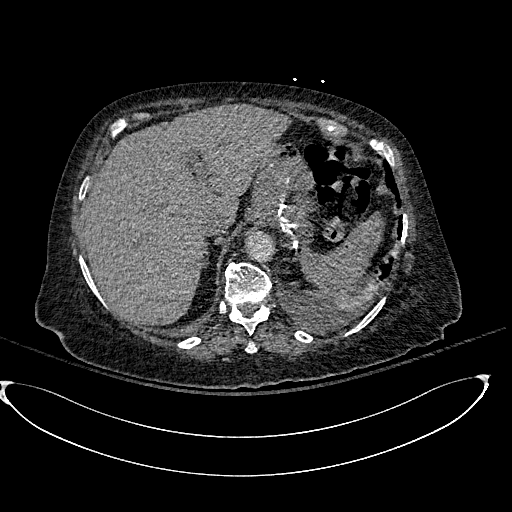
[im 58/382  lung]
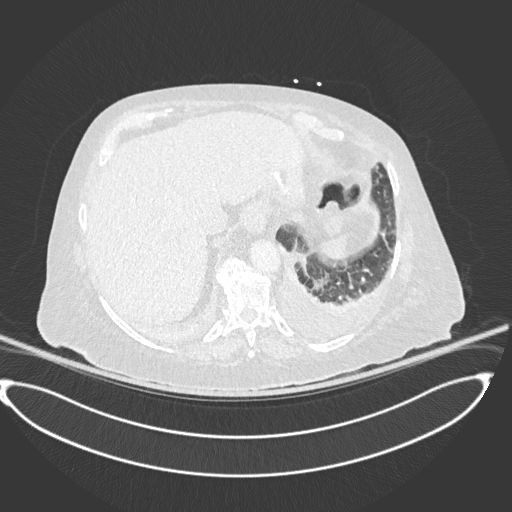
[im 77/382  mediastinal]
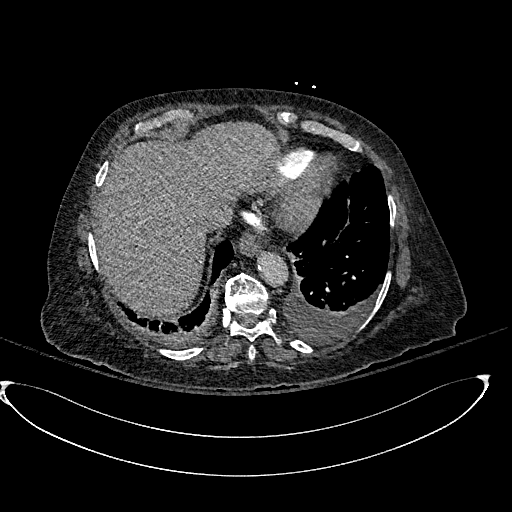
[im 115/382  lung]
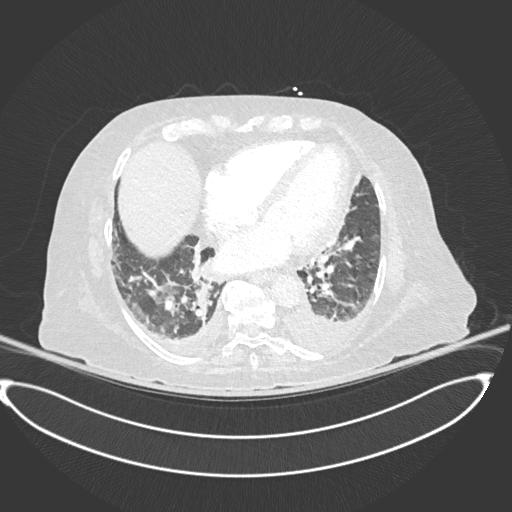
[im 134/382  mediastinal]
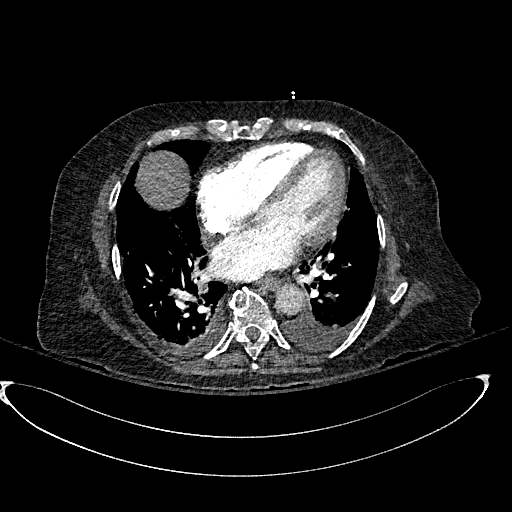
[im 153/382  lung]
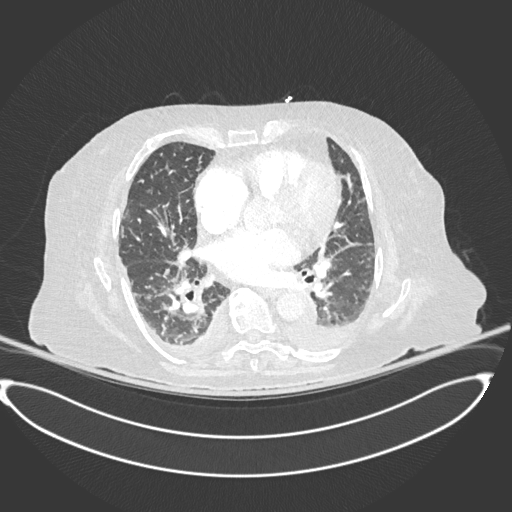
[im 172/382  mediastinal]
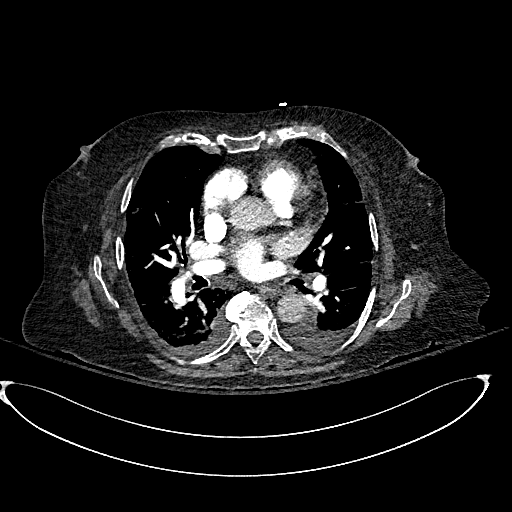
[im 191/382  lung]
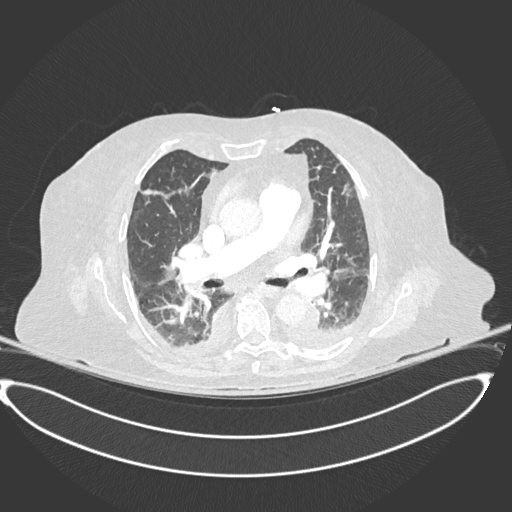
[im 210/382  mediastinal]
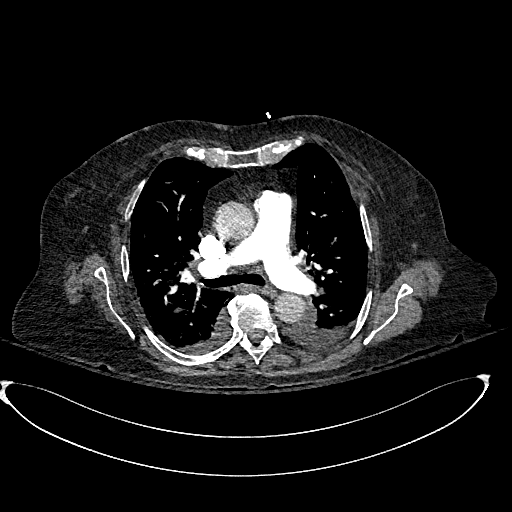
[im 229/382  lung]
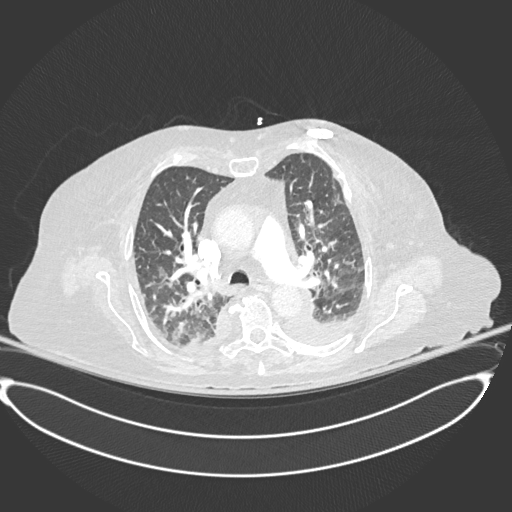
[im 248/382  mediastinal]
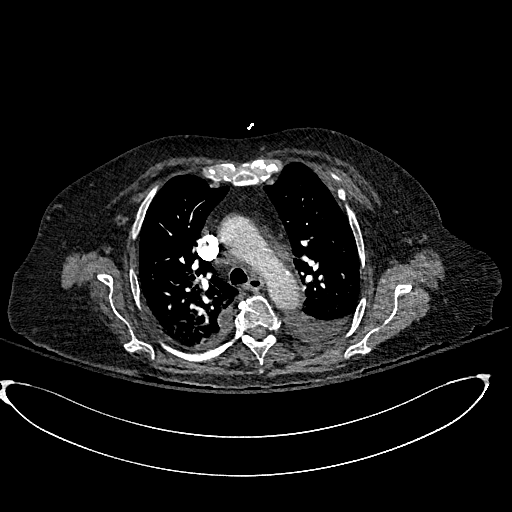
[im 267/382  lung]
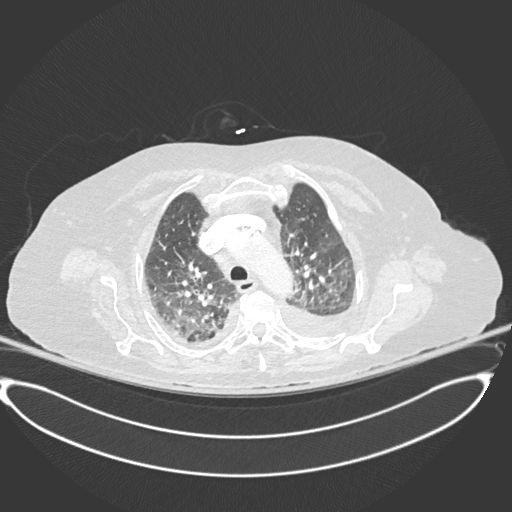
[im 305/382  mediastinal]
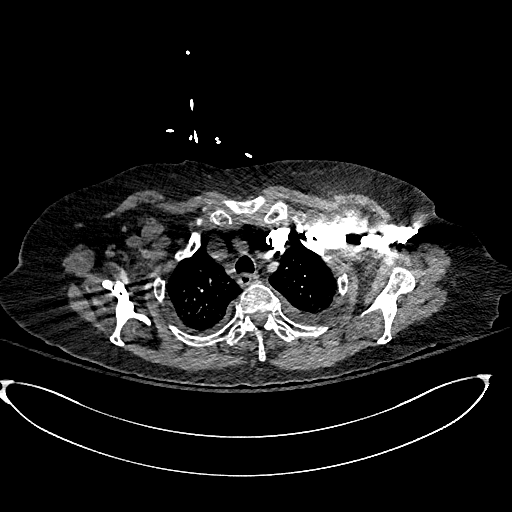
[im 324/382  lung]
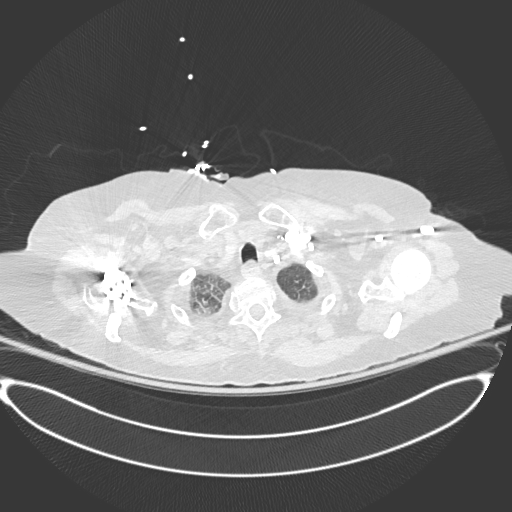
[im 343/382  mediastinal]
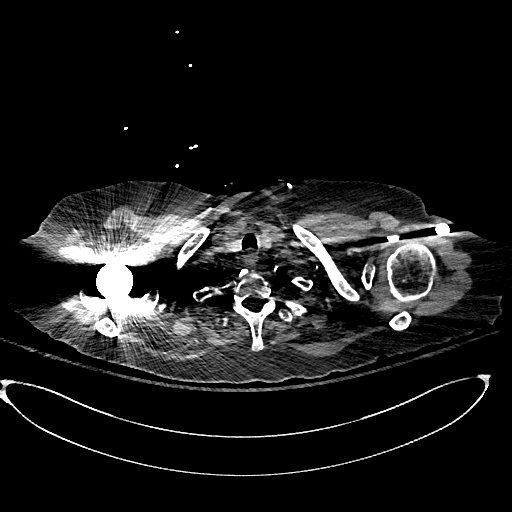
[im 362/382  lung]
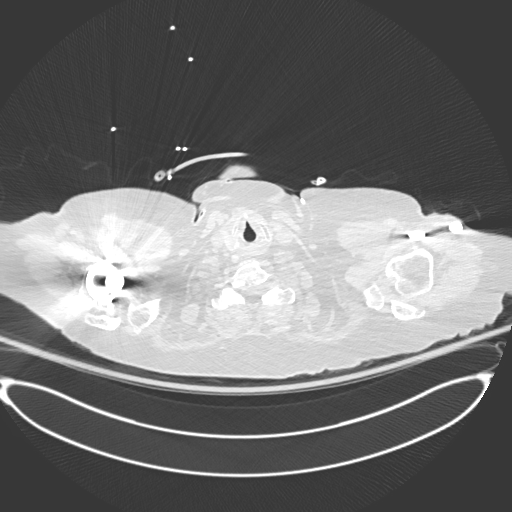

[Series 8: pe 2mm cor · coronal · 0.54mm/px · 1 of 145 slices shown]
[im 73/145  mediastinal]
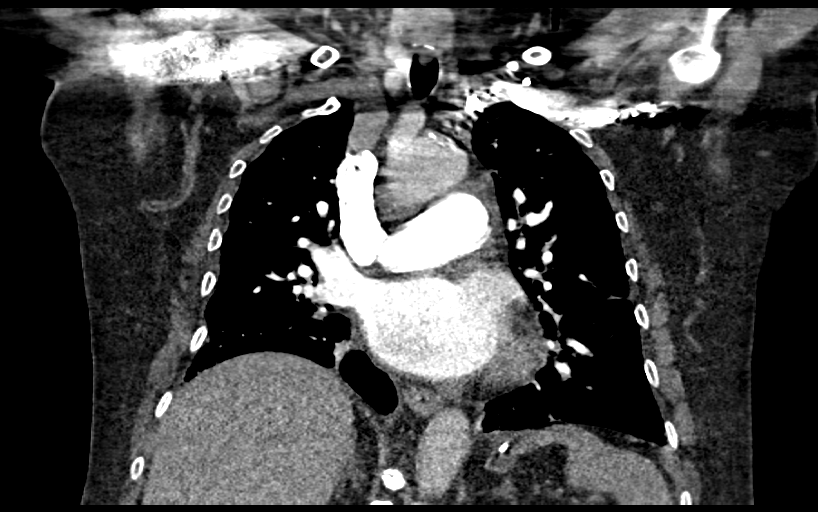

[18 of 36 positions shown; findings below may reference images not displayed]

RADIATION DOSE REDUCTION: This exam was performed according to the
departmental dose-optimization program which includes automated
exposure control, adjustment of the mA and/or kV according to
patient size and/or use of iterative reconstruction technique.

CONTRAST:  65mL OMNIPAQUE IOHEXOL 350 MG/ML SOLN
FINDINGS: Cardiovascular: Thoracic aorta shows atherosclerotic calcifications.
No aneurysmal dilatation is noted. No cardiac enlargement is seen.
No coronary calcifications are noted. The pulmonary artery shows a
normal branching pattern bilaterally. No filling defect to suggest
pulmonary embolism is noted.

Mediastinum/Nodes: Thoracic inlet is within normal limits. No
sizable hilar or mediastinal adenopathy is noted. The esophagus as
visualized is within normal limits.

Lungs/Pleura: Bilateral pleural effusions are noted left slightly
greater than right with mild atelectatic changes in the bases
bilaterally. No focal confluent infiltrate is seen. No parenchymal
nodules are noted.

Upper Abdomen: Postsurgical changes are noted in the stomach
consistent with prior gastric bypass. The remainder of the upper
abdomen appears within normal limits.

Musculoskeletal: Degenerative changes of the thoracic spine are
noted. Prior right shoulder replacement is noted. Fractures
involving the left fourth and sixth ribs are noted anteriorly. Some
healed fractures are noted on the left as well.

Review of the MIP images confirms the above findings.
IMPRESSION: No evidence of pulmonary emboli.

Fourth and sixth left rib fractures without complicating factors.

Old rib fractures are seen on the left.

Bilateral effusions with basilar atelectasis.

Aortic Atherosclerosis ([2V]-[2V]).

## 2022-02-04 IMAGING — CR DG CHEST 2V
2 series · 2 of 2 positions shown · non-contrast
Comparison: [DATE]

CLINICAL DATA: Dyspnea

EXAM:
CHEST - 2 VIEW

[chest ap]
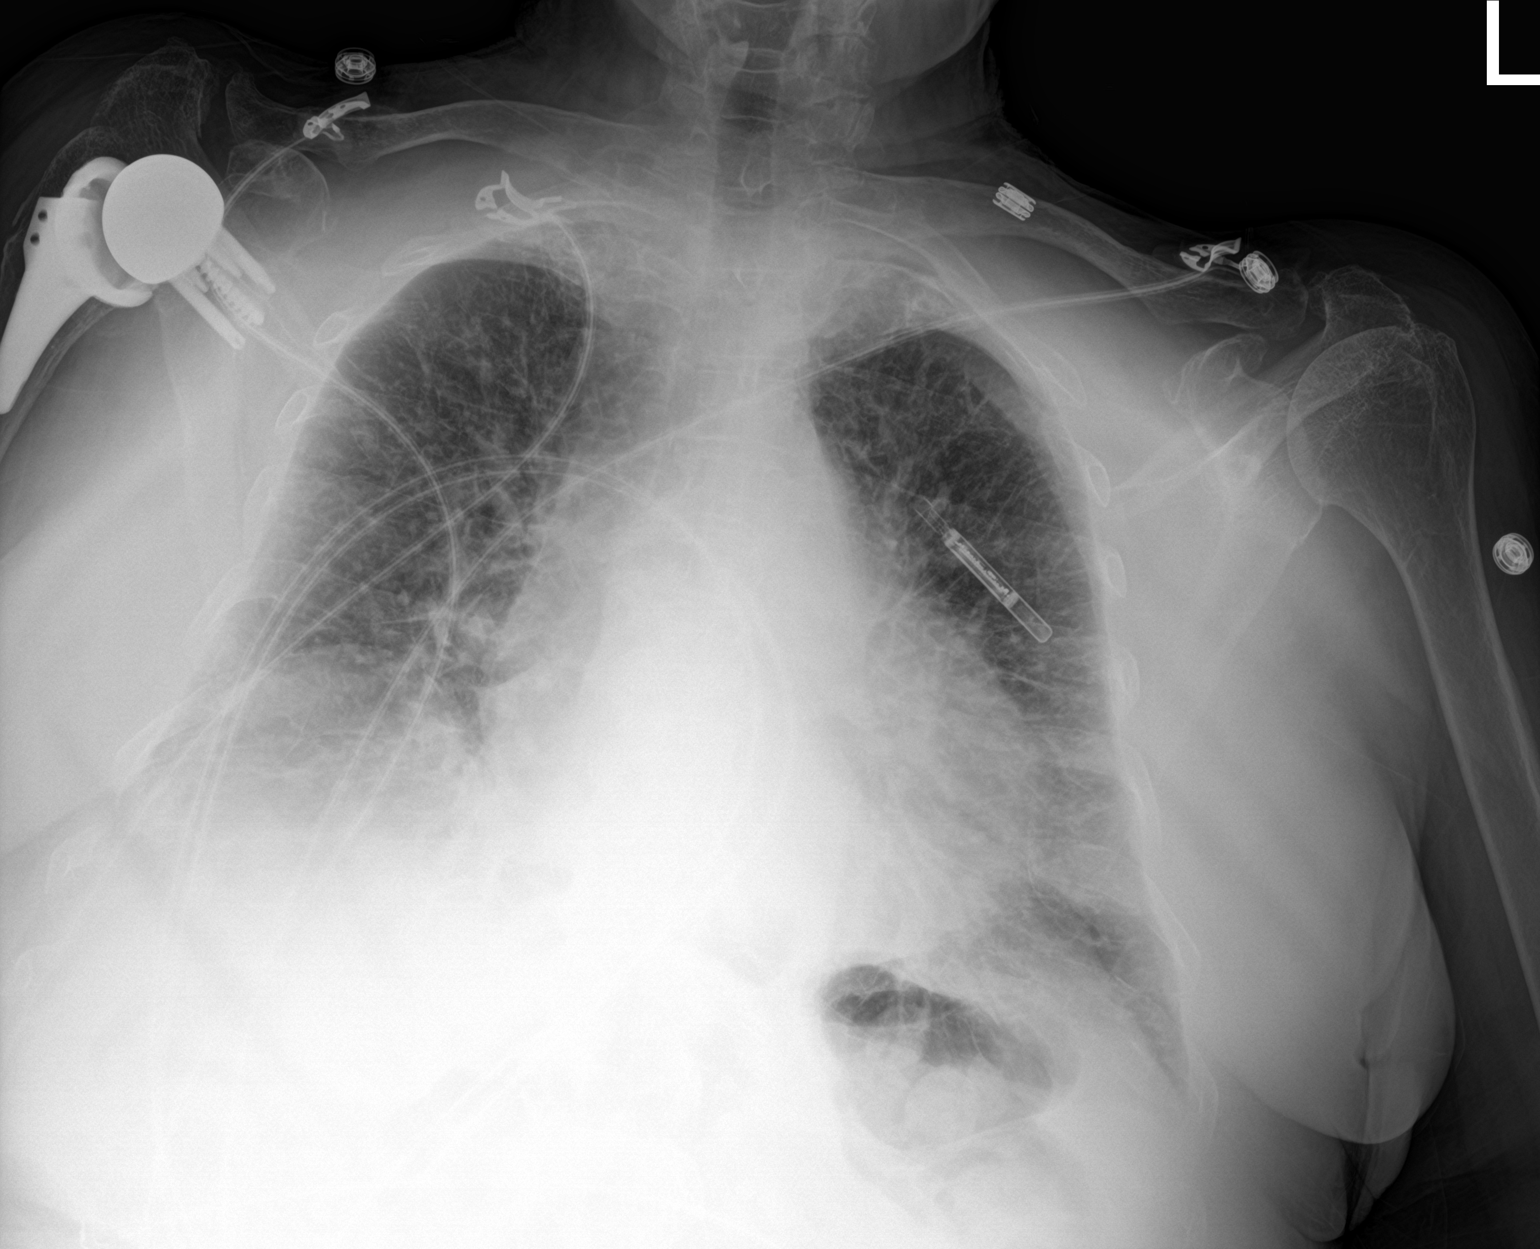

[chest lat]
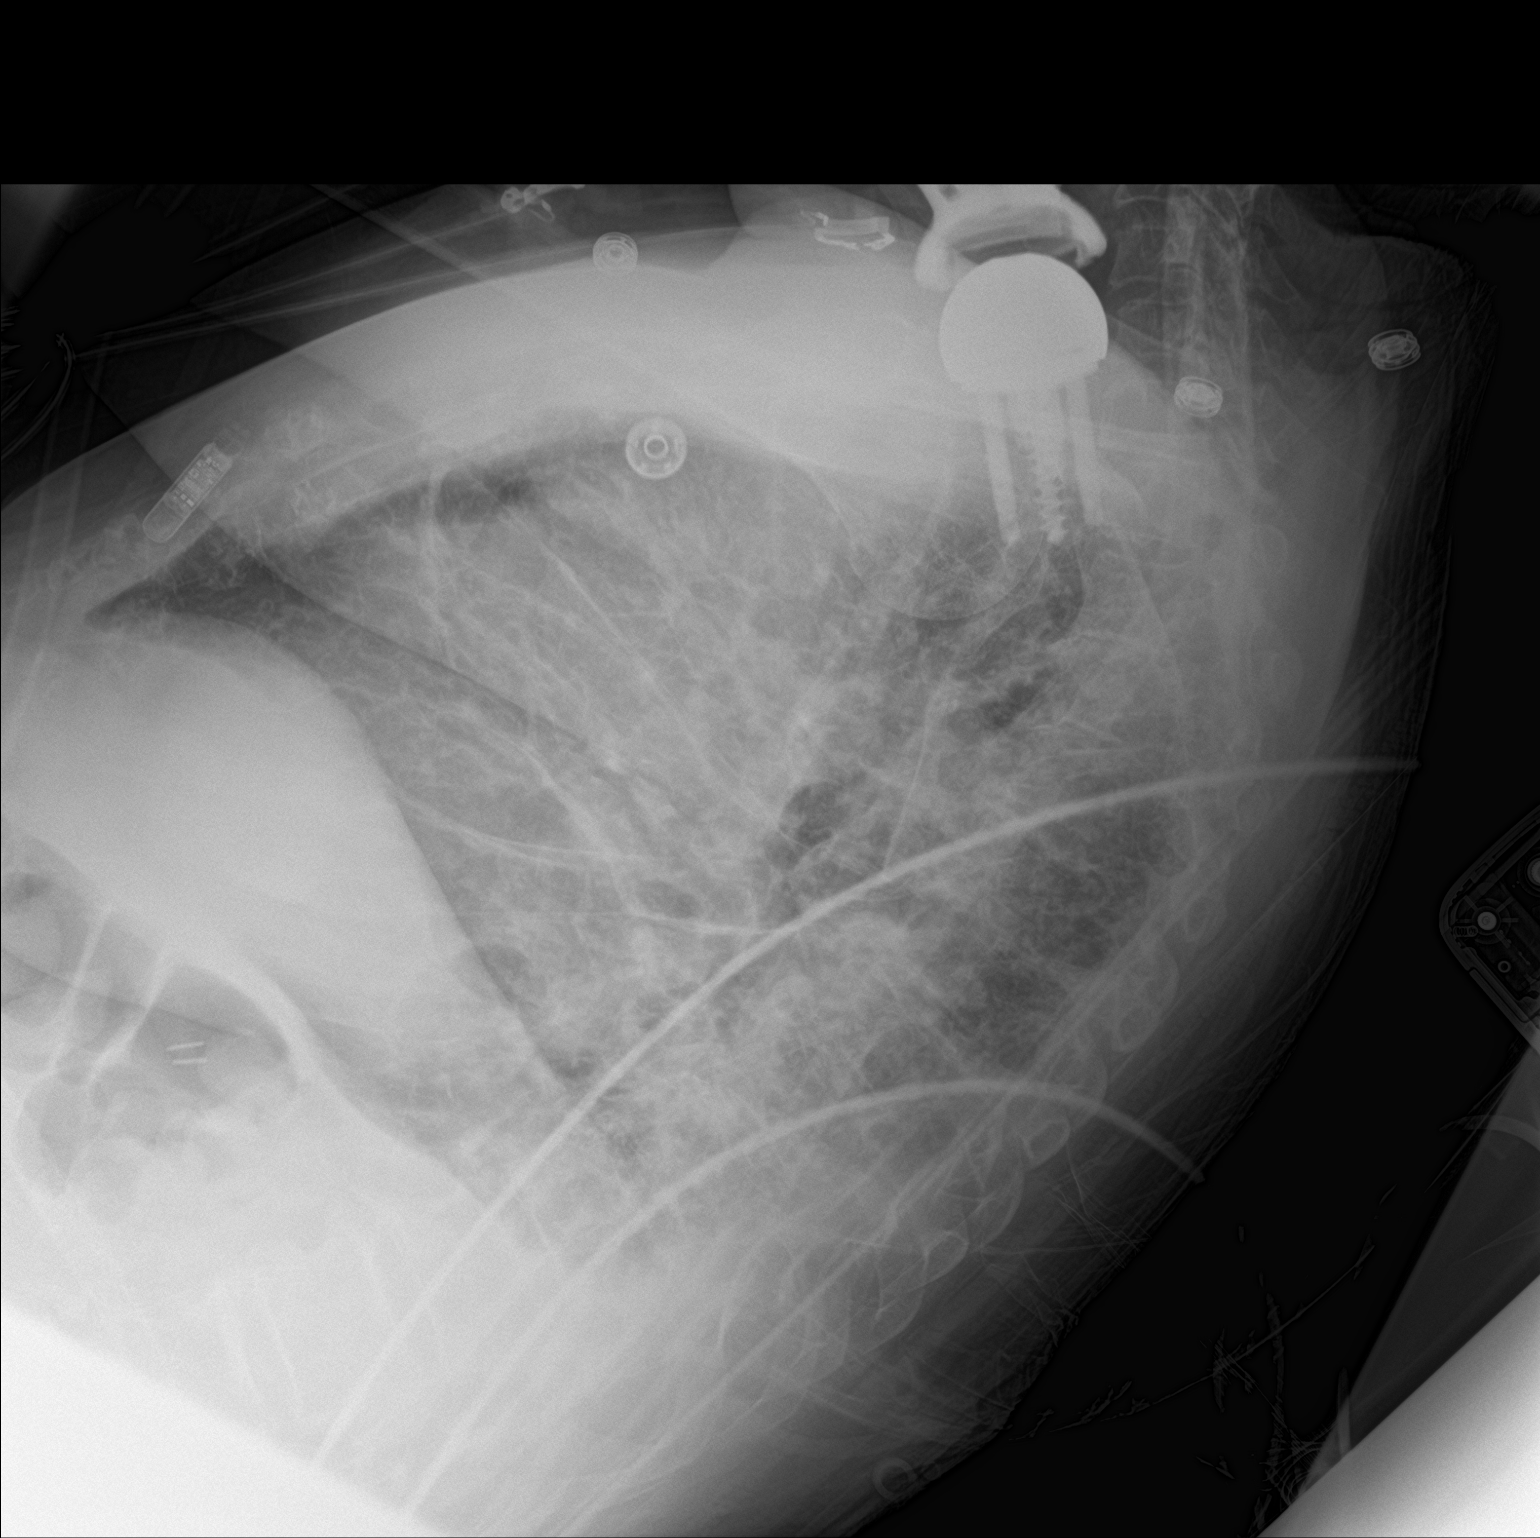

[2 of 2 positions shown; findings below may reference images not displayed]

FINDINGS: Stable heart size. Low lung volumes. Small bilateral pleural
effusions with diffusely increased interstitial markings
bilaterally. No pneumothorax. Reverse right shoulder arthroplasty.
IMPRESSION: Small bilateral pleural effusions with diffusely increased
interstitial markings bilaterally which may represent a component of
edema.

## 2022-02-04 MED ORDER — IOHEXOL 350 MG/ML SOLN
65.0000 mL | Freq: Once | INTRAVENOUS | Status: AC | PRN
  Administered 2022-02-04: 65 mL via INTRAVENOUS

## 2022-02-04 MED ORDER — ACETAMINOPHEN 325 MG PO TABS
650.0000 mg | ORAL_TABLET | Freq: Four times a day (QID) | ORAL | Status: DC | PRN
  Administered 2022-02-04: 650 mg via ORAL
  Filled 2022-02-04: qty 2

## 2022-02-04 NOTE — Progress Notes (Signed)
ANTICOAGULATION CONSULT NOTE - Follow Up Consult  Pharmacy Consult for heparin Indication: chest pain/ACS  Allergies  Allergen Reactions   Atorvastatin     Other reaction(s): myalgia   Crestor [Rosuvastatin]     Other reaction(s): myalgia   Ezetimibe     Other reaction(s): myalgia   Amitriptyline Rash    Patient Measurements: Weight: 93.5 kg (206 lb 3.2 oz) (scale A) Heparin Dosing Weight: 82.3 kg    Vital Signs: Temp: 99 F (37.2 C) (02/21 1200) Temp Source: Oral (02/21 1200) BP: 105/57 (02/21 1200)  Labs: Recent Labs    02/03/22 1608 02/03/22 1749 02/04/22 0120 02/04/22 1207  HGB 8.4*  --  8.9*  --   HCT 26.9*  --  27.2*  --   PLT 315  --  304  --   HEPARINUNFRC  --   --  0.24* 0.41  CREATININE 1.74*  --  1.93*  --   TROPONINIHS 70* 79*  --   --     Estimated Creatinine Clearance: 30 mL/min (A) (by C-G formula based on SCr of 1.93 mg/dL (H)).   Medications:  Scheduled:   amphetamine-dextroamphetamine  10 mg Oral Q breakfast   aspirin  81 mg Oral QHS   baclofen  20 mg Oral QHS   bisoprolol  10 mg Oral Daily   calcium carbonate  1 tablet Oral Q breakfast   DULoxetine  60 mg Oral BID   gabapentin  300 mg Oral TID   HYDROcodone-acetaminophen  1 tablet Oral QID   pantoprazole  40 mg Oral BID   rOPINIRole  1 mg Oral Daily   And   rOPINIRole  2 mg Oral BID   sulfamethoxazole-trimethoprim  1 tablet Oral BID   triamcinolone ointment  1 application Topical BID   [START ON 02/09/2022] Vitamin D (Ergocalciferol)  50,000 Units Oral Q7 days   Infusions:   heparin 1,750 Units/hr (02/04/22 1914)    Assessment: 76 yo F started on heparin for chest pain and DOE.  CT neg for PE.  Plans for cardiac cath 2/22.  Heparin level therapeutic on 1750 units/hr.  No bleeding noted.  Goal of Therapy:  Heparin level 0.3-0.7 units/ml Monitor platelets by anticoagulation protocol: Yes   Plan:  Continue heparin at 1750 units/hr Repeat heparin level in 6 hours for  confirmation. Daily heparin level and CBC while on heparin.  [employer], Pharm.D., BCPS Clinical Pharmacist  **Pharmacist phone directory can be found on [email or web address] listed under [place].  02/04/2022 3:14 PM
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
## 2022-02-04 NOTE — Telephone Encounter (Signed)
Pt called wanting to in form RN that she went to her heart doctor and was admitted to the hospital from there. She is wanting to know if the hospital can do her NCV/EMG there and she would also like to thank the RN for the help she gave her to get home health so quickly.  Pt has been admitted to [place]  #3 room 3 Please call pt back when available.

## 2022-02-04 NOTE — Progress Notes (Signed)
ANTICOAGULATION CONSULT NOTE  Pharmacy Consult for heparin Indication: chest pain/ACS  Allergies  Allergen Reactions   Atorvastatin     Other reaction(s): myalgia   Crestor [Rosuvastatin]     Other reaction(s): myalgia   Ezetimibe     Other reaction(s): myalgia   Amitriptyline Rash    Patient Measurements: Weight: 93.5 kg (206 lb 3.2 oz) (scale A) Heparin Dosing Weight: 82.3 kg   Vital Signs: Temp: 99 F (37.2 C) (02/21 1200) Temp Source: Oral (02/21 1200) BP: 105/57 (02/21 1200)  Labs: Recent Labs    02/03/22 1608 02/03/22 1749 02/04/22 0120 02/04/22 1207 02/04/22 1445 02/04/22 1854  HGB 8.4*  --  8.9*  --   --   --   HCT 26.9*  --  27.2*  --   --   --   PLT 315  --  304  --   --   --   HEPARINUNFRC  --   --  0.24* 0.41  --  0.46  CREATININE 1.74*  --  1.93*  --  1.96*  --   TROPONINIHS 70* 79*  --   --   --   --      Estimated Creatinine Clearance: 29.6 mL/min (A) (by C-G formula based on SCr of 1.96 mg/dL (H)).  Assessment: 76 yo F started on heparin for chest pain and DOE.  CT neg for PE.  Plan for cardiac cath 2/22.    Confirmatory heparin level therapeutic on 1750 units/hr.  No bleeding noted.  Goal of Therapy:  Heparin level 0.3-0.7 units/ml Monitor platelets by anticoagulation protocol: Yes   Plan:  Continue heparin at 1750 units/hr Daily heparin level and CBC   Rankin Coolman D. Mina Marble, PharmD, BCPS, [place] 02/04/2022, 7:39 PM

## 2022-02-04 NOTE — Progress Notes (Signed)
Mobility Specialist Progress Note: ° ° 02/04/22 1245  °Mobility  °Activity Ambulated with assistance in room;Ambulated with assistance to bathroom  °Level of Assistance Standby assist, set-up cues, supervision of patient - no hands on  °Assistive Device Front wheel walker  °Distance Ambulated (ft) 30 ft  °Activity Response Tolerated well  °$Mobility charge 1 Mobility  ° °Pt requesting to go to BR, then to sink to brush teeth. No dizziness reported, pt left in chair with all needs met.  ° °  °Acute Rehab °Phone: [phone] °Office Phone: [phone] ° °

## 2022-02-04 NOTE — Progress Notes (Signed)
° °  Discussed with pt plan for nuc study tomorrow.  If Neg then Rt heart cath on Thursday if + then most likely Rt and Lt heart cath on Thursday.  She is still SOB only with exertion.  Explained why with held lasix today and will check Cr this afternoon if improved may need dose of Lasix.  She is still very SOB with exertion.   Cecilie Kicks, FNP-C At [place]  EUM:[phone] or after 5pm and on weekends call [phone] 02/04/2022.
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
## 2022-02-04 NOTE — Progress Notes (Signed)
Mobility Specialist Progress Note:   02/04/22 1100  Orthostatic Lying   BP- Lying 94/75  Orthostatic Sitting  BP- Sitting 92/60  Orthostatic Standing at 0 minutes  BP- Standing at 0 minutes (!) 89/46  Orthostatic Standing at 3 minutes  BP- Standing at 3 minutes 105/57  Mobility  Activity Stood at bedside  Level of Assistance Minimal assist, patient does 75% or more  Assistive Device None  Activity Response Tolerated well  $Mobility charge 1 Mobility   Pt agreeable to OOB mobility this am. Orthostatics taken, pt c/o increased dizziness upon standing, resolved after 3 min. Further mobility deferred.  Nelta Numbers Acute Rehab Phone: [phone] Office Phone: [phone]

## 2022-02-04 NOTE — Progress Notes (Signed)
ANTICOAGULATION CONSULT NOTE - Follow Up Consult  Pharmacy Consult for heparin Indication:  r/o ACS vs PE  Labs: Recent Labs    02/03/22 1608 02/03/22 1749 02/04/22 0120  HGB 8.4*  --  8.9*  HCT 26.9*  --  27.2*  PLT 315  --  304  HEPARINUNFRC  --   --  0.24*  CREATININE 1.74*  --  1.93*  TROPONINIHS 70* 79*  --     Assessment: 75yo female subtherapeutic on heparin with initial dosing for possible ACS vs PE (CT negative for PE); no infusion issues or signs of bleeding per RN.  Goal of Therapy:  Heparin level 0.3-0.7 units/ml   Plan:  Will increase heparin infusion by 2-3 units/kg/hr to 1750 units/hr and check level in 8 hours.    Wynona Neat, PharmD, BCPS  02/04/2022,3:52 AM

## 2022-02-04 NOTE — Telephone Encounter (Signed)
Called the patient back. Advised the patient that I dont believe the nerve conduction study is offered in the hospital setting. Stated that if it is and they are able to get the ball rolling on that test it would be fine by [place]. I reviewed the additional lab test as well with her and she has stopped oral iron. She is aware that we will recheck these in 8 weeks. She will restart home health when she gets dc from the hospital.     A lower transferritin level indicates iron overload/ supply.  Transferrin will be higher in iron deficiency. No iv iron at this time, and stop oral iron for 8 weeks, until we check again.  Please add vitamin C to your supplements.

## 2022-02-04 NOTE — Progress Notes (Signed)
Pt with multiple discrepancies related to the doses and frequencies of her medications. Pt encouraged to endorse these to the round MD in the morning.

## 2022-02-05 ENCOUNTER — Ambulatory Visit: Payer: Medicare Other | Admitting: Internal Medicine

## 2022-02-05 ENCOUNTER — Inpatient Hospital Stay (HOSPITAL_COMMUNITY): Payer: Medicare Other

## 2022-02-05 DIAGNOSIS — I5031 Acute diastolic (congestive) heart failure: Secondary | ICD-10-CM | POA: Diagnosis not present

## 2022-02-05 DIAGNOSIS — Z9181 History of falling: Secondary | ICD-10-CM | POA: Diagnosis not present

## 2022-02-05 DIAGNOSIS — R079 Chest pain, unspecified: Secondary | ICD-10-CM | POA: Diagnosis not present

## 2022-02-05 DIAGNOSIS — R42 Dizziness and giddiness: Secondary | ICD-10-CM | POA: Diagnosis not present

## 2022-02-05 DIAGNOSIS — R0609 Other forms of dyspnea: Secondary | ICD-10-CM | POA: Diagnosis not present

## 2022-02-05 LAB — CBC
HCT: 27.9 % — ABNORMAL LOW (ref 36.0–46.0)
Hemoglobin: 8.8 g/dL — ABNORMAL LOW (ref 12.0–15.0)
MCH: 31.5 pg (ref 26.0–34.0)
MCHC: 31.5 g/dL (ref 30.0–36.0)
MCV: 100 fL (ref 80.0–100.0)
Platelets: 284 10*3/uL (ref 150–400)
RBC: 2.79 MIL/uL — ABNORMAL LOW (ref 3.87–5.11)
RDW: 14.9 % (ref 11.5–15.5)
WBC: 6.6 10*3/uL (ref 4.0–10.5)
nRBC: 0 % (ref 0.0–0.2)

## 2022-02-05 LAB — BASIC METABOLIC PANEL
Anion gap: 12 (ref 5–15)
BUN: 31 mg/dL — ABNORMAL HIGH (ref 8–23)
CO2: 20 mmol/L — ABNORMAL LOW (ref 22–32)
Calcium: 8.7 mg/dL — ABNORMAL LOW (ref 8.9–10.3)
Chloride: 104 mmol/L (ref 98–111)
Creatinine, Ser: 1.8 mg/dL — ABNORMAL HIGH (ref 0.44–1.00)
GFR, Estimated: 29 mL/min — ABNORMAL LOW (ref >60)
Glucose, Bld: 101 mg/dL — ABNORMAL HIGH (ref 70–99)
Potassium: 3.5 mmol/L (ref 3.5–5.1)
Sodium: 136 mmol/L (ref 135–145)

## 2022-02-05 LAB — NM MYOCAR MULTI W/SPECT W/WALL MOTION / EF
Estimated workload: 1
Exercise duration (min): 0 min
Exercise duration (sec): 0 s
MPHR: 146 {beats}/min
Peak HR: 89 {beats}/min
Percent HR: 60 %
Rest HR: 77 {beats}/min

## 2022-02-05 LAB — HEPARIN LEVEL (UNFRACTIONATED): Heparin Unfractionated: 0.6 IU/mL (ref 0.30–0.70)

## 2022-02-05 MED ORDER — ROPINIROLE HCL 1 MG PO TABS
1.0000 mg | ORAL_TABLET | Freq: Every day | ORAL | Status: DC
  Filled 2022-02-05: qty 1

## 2022-02-05 MED ORDER — REGADENOSON 0.4 MG/5ML IV SOLN
INTRAVENOUS | Status: AC
  Administered 2022-02-05: 0.4 mg via INTRAVENOUS
  Filled 2022-02-05: qty 5

## 2022-02-05 MED ORDER — TECHNETIUM TC 99M TETROFOSMIN IV KIT
31.1000 | PACK | Freq: Once | INTRAVENOUS | Status: AC | PRN
  Administered 2022-02-05: 31.1 via INTRAVENOUS

## 2022-02-05 MED ORDER — TECHNETIUM TC 99M TETROFOSMIN IV KIT
10.6000 | PACK | Freq: Once | INTRAVENOUS | Status: AC | PRN
  Administered 2022-02-05: 10.6 via INTRAVENOUS

## 2022-02-05 MED ORDER — POLYETHYLENE GLYCOL 3350 17 G PO PACK
17.0000 g | PACK | Freq: Every day | ORAL | Status: DC
  Administered 2022-02-06 – 2022-02-09 (×4): 17 g via ORAL
  Filled 2022-02-05 (×5): qty 1

## 2022-02-05 MED ORDER — SODIUM CHLORIDE 0.9% FLUSH: 3.0000 mL | INTRAVENOUS | Status: DC | PRN

## 2022-02-05 MED ORDER — HEPARIN SODIUM (PORCINE) 5000 UNIT/ML IJ SOLN
5000.0000 [IU] | Freq: Three times a day (TID) | INTRAMUSCULAR | Status: DC
  Administered 2022-02-05 – 2022-02-06 (×2): 5000 [IU] via SUBCUTANEOUS
  Filled 2022-02-05 (×2): qty 1

## 2022-02-05 MED ORDER — ROPINIROLE HCL 1 MG PO TABS: 1.0000 mg | ORAL_TABLET | ORAL | Status: DC

## 2022-02-05 MED ORDER — REGADENOSON 0.4 MG/5ML IV SOLN: 0.4000 mg | Freq: Once | INTRAVENOUS | Status: AC

## 2022-02-05 MED ORDER — ROPINIROLE HCL 1 MG PO TABS
2.0000 mg | ORAL_TABLET | Freq: Two times a day (BID) | ORAL | Status: DC
  Filled 2022-02-05: qty 2

## 2022-02-05 MED ORDER — SODIUM CHLORIDE 0.9% FLUSH
3.0000 mL | Freq: Two times a day (BID) | INTRAVENOUS | Status: DC
  Administered 2022-02-05 – 2022-02-15 (×19): 3 mL via INTRAVENOUS

## 2022-02-05 MED ORDER — SODIUM CHLORIDE 0.9 % IV SOLN: 250.0000 mL | INTRAVENOUS | Status: DC | PRN

## 2022-02-05 MED ORDER — ASPIRIN 81 MG PO CHEW
81.0000 mg | CHEWABLE_TABLET | ORAL | Status: AC
  Administered 2022-02-06: 81 mg via ORAL
  Filled 2022-02-05: qty 1

## 2022-02-05 MED ORDER — SODIUM CHLORIDE 0.9 % IV SOLN: INTRAVENOUS | Status: DC

## 2022-02-05 NOTE — Progress Notes (Signed)
ANTICOAGULATION CONSULT NOTE  Pharmacy Consult for heparin Indication: chest pain/ACS  Allergies  Allergen Reactions   Atorvastatin     Other reaction(s): myalgia   Crestor [Rosuvastatin]     Other reaction(s): myalgia   Ezetimibe     Other reaction(s): myalgia   Amitriptyline Rash    Patient Measurements: Weight: 94.3 kg (208 lb) (scale a) Heparin Dosing Weight: 82.3 kg   Vital Signs: Temp: 97.7 F (36.5 C) (02/22 0401) Temp Source: Oral (02/22 0401) BP: 93/56 (02/22 0401) Pulse Rate: 73 (02/22 0401)  Labs: Recent Labs    02/03/22 1608 02/03/22 1608 02/03/22 1749 02/04/22 0120 02/04/22 1207 02/04/22 1445 02/04/22 1854 02/05/22 0342  HGB 8.4*  --   --  8.9*  --   --   --  8.8*  HCT 26.9*  --   --  27.2*  --   --   --  27.9*  PLT 315  --   --  304  --   --   --  284  HEPARINUNFRC  --    < >  --  0.24* 0.41  --  0.46 0.60  CREATININE 1.74*  --   --  1.93*  --  1.96*  --  1.80*  TROPONINIHS 70*  --  79*  --   --   --   --   --    < > = values in this interval not displayed.     Estimated Creatinine Clearance: 32.3 mL/min (A) (by C-G formula based on SCr of 1.8 mg/dL (H)).  Assessment: 76 yo F started on heparin for chest pain and DOE.  CT neg for PE.  Plan for nuclear study on 2/22 and then possible cath -heparin level at goal, CBC stable    Goal of Therapy:  Heparin level 0.3-0.7 units/ml Monitor platelets by anticoagulation protocol: Yes   Plan:  Continue heparin at 1750 units/hr Daily heparin level and CBC   Hildred Laser, PharmD Clinical Pharmacist **Pharmacist phone directory can now be found on [email or web address] ([email or web address]).  Listed under [place].

## 2022-02-05 NOTE — TOC Progression Note (Addendum)
Transition of Care [facility]) - Progression Note    Patient Details  Name: [name] MRN: [id number] Date of Birth: [birth year]  [facility]) CM/SW Contact  Zenon Mayo, RN Phone Number: 02/05/2022, 2:48 PM  Clinical Narrative:    Per Corene Cornea with [facility], patient is Active with [facility] for RN, PT, OT, ST. NCM asked MD for orders.         Expected Discharge Plan and Services                                                 Social Determinants of Health (SDOH) Interventions    Readmission Risk Interventions No flowsheet data found.

## 2022-02-05 NOTE — Progress Notes (Signed)
Results of nuclear stress test reviewed and showed no ischemia and ef 50%. Will plan on RHC in am to assess filling pressure to help guide diuretic use.  OK to stop IV Heparin and transition to DVT prophylaxis. Given her low SBP in the mid 70's, Positive orthostatics on exam, will start with placing back on Compression hose.  Check Am Cortisol level to rule out adrenal insuff.  I am concerned that she may have some underlying dementia.  I explained the reason that is is dizzy with low BP as well as having cath multiple times today and she would quickly forget that we discussed this.  She is seeing Neuro for workup of peripheral neuropathy and RLS.  With the profound orthostatic hypotension ? If she could have some type or neurodegenerative disorder. She is on multiple meds that can cause hypotension at home including Bisoprolol (held), cymbalta, gabapentin.  I am going to get Neuro to see her in the hospital.

## 2022-02-05 NOTE — Progress Notes (Signed)
° °  Patient presented for a nuclear stress test today. No immediate complications. Stress imaging is pending at this time.   Preliminary EKG findings may be listed in the chart, but the stress test result will not be finalized until perfusion imaging is complete.  Darreld Mclean, PA-C 02/05/2022 11:29 AM

## 2022-02-05 NOTE — H&P (View-Only) (Signed)
° °  Patient presented for a nuclear stress test today. No immediate complications. Stress imaging is pending at this time.   Preliminary EKG findings may be listed in the chart, but the stress test result will not be finalized until perfusion imaging is complete.  Darreld Mclean, PA-C 02/05/2022 11:29 AM
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
## 2022-02-05 NOTE — Progress Notes (Signed)
Mobility Specialist Progress Note:   02/05/22 1530  Orthostatic Sitting  BP- Sitting 110/60  Orthostatic Standing at 0 minutes  BP- Standing at 0 minutes 94/59  Orthostatic Standing at 3 minutes  BP- Standing at 3 minutes (!) 75/56  Mobility  Activity Stood at bedside  Level of Assistance Standby assist, set-up cues, supervision of patient - no hands on  Assistive Device Front wheel walker  Activity Response Tolerated fair  $Mobility charge 1 Mobility   Pt symptomatic orthostatic this afternoon. Pt becomes shaky and dizziness with 3 min of standing. Pt back sitting EOB with all needs met.  Nelta Numbers Acute Rehab Phone: [phone] Office Phone: [phone]

## 2022-02-06 ENCOUNTER — Encounter (HOSPITAL_COMMUNITY): Payer: Self-pay | Admitting: Interventional Cardiology

## 2022-02-06 ENCOUNTER — Inpatient Hospital Stay (HOSPITAL_COMMUNITY): Payer: Medicare Other

## 2022-02-06 ENCOUNTER — Encounter (HOSPITAL_COMMUNITY)
Admission: AD | Disposition: A | Discharge status: Rehabilitation Facility | Payer: Self-pay | Source: Ambulatory Visit | Attending: Internal Medicine

## 2022-02-06 DIAGNOSIS — G909 Disorder of the autonomic nervous system, unspecified: Secondary | ICD-10-CM

## 2022-02-06 DIAGNOSIS — R0609 Other forms of dyspnea: Secondary | ICD-10-CM | POA: Diagnosis not present

## 2022-02-06 DIAGNOSIS — I2729 Other secondary pulmonary hypertension: Secondary | ICD-10-CM | POA: Diagnosis not present

## 2022-02-06 DIAGNOSIS — I10 Essential (primary) hypertension: Secondary | ICD-10-CM

## 2022-02-06 DIAGNOSIS — N189 Chronic kidney disease, unspecified: Secondary | ICD-10-CM

## 2022-02-06 DIAGNOSIS — I5033 Acute on chronic diastolic (congestive) heart failure: Secondary | ICD-10-CM | POA: Diagnosis not present

## 2022-02-06 DIAGNOSIS — G4733 Obstructive sleep apnea (adult) (pediatric): Secondary | ICD-10-CM | POA: Diagnosis not present

## 2022-02-06 DIAGNOSIS — I5032 Chronic diastolic (congestive) heart failure: Secondary | ICD-10-CM

## 2022-02-06 DIAGNOSIS — G63 Polyneuropathy in diseases classified elsewhere: Secondary | ICD-10-CM

## 2022-02-06 DIAGNOSIS — I951 Orthostatic hypotension: Secondary | ICD-10-CM

## 2022-02-06 DIAGNOSIS — I480 Paroxysmal atrial fibrillation: Secondary | ICD-10-CM

## 2022-02-06 DIAGNOSIS — D631 Anemia in chronic kidney disease: Secondary | ICD-10-CM

## 2022-02-06 HISTORY — PX: RIGHT HEART CATH: CATH118263

## 2022-02-06 LAB — CBC
HCT: 27.9 % — ABNORMAL LOW (ref 36.0–46.0)
Hemoglobin: 9.1 g/dL — ABNORMAL LOW (ref 12.0–15.0)
MCH: 32.4 pg (ref 26.0–34.0)
MCHC: 32.6 g/dL (ref 30.0–36.0)
MCV: 99.3 fL (ref 80.0–100.0)
Platelets: 272 10*3/uL (ref 150–400)
RBC: 2.81 MIL/uL — ABNORMAL LOW (ref 3.87–5.11)
RDW: 14.9 % (ref 11.5–15.5)
WBC: 7.2 10*3/uL (ref 4.0–10.5)
nRBC: 0 % (ref 0.0–0.2)

## 2022-02-06 LAB — POCT I-STAT EG7
Acid-base deficit: 3 mmol/L — ABNORMAL HIGH (ref 0.0–2.0)
Acid-base deficit: 3 mmol/L — ABNORMAL HIGH (ref 0.0–2.0)
Bicarbonate: 21.9 mmol/L (ref 20.0–28.0)
Bicarbonate: 21.9 mmol/L (ref 20.0–28.0)
Calcium, Ion: 1.26 mmol/L (ref 1.15–1.40)
Calcium, Ion: 1.27 mmol/L (ref 1.15–1.40)
HCT: 27 % — ABNORMAL LOW (ref 36.0–46.0)
HCT: 28 % — ABNORMAL LOW (ref 36.0–46.0)
Hemoglobin: 9.2 g/dL — ABNORMAL LOW (ref 12.0–15.0)
Hemoglobin: 9.5 g/dL — ABNORMAL LOW (ref 12.0–15.0)
O2 Saturation: 66 %
O2 Saturation: 67 %
Potassium: 4 mmol/L (ref 3.5–5.1)
Potassium: 4 mmol/L (ref 3.5–5.1)
Sodium: 138 mmol/L (ref 135–145)
Sodium: 139 mmol/L (ref 135–145)
TCO2: 23 mmol/L (ref 22–32)
TCO2: 23 mmol/L (ref 22–32)
pCO2, Ven: 38.1 mmHg — ABNORMAL LOW (ref 44–60)
pCO2, Ven: 38.4 mmHg — ABNORMAL LOW (ref 44–60)
pH, Ven: 7.365 (ref 7.25–7.43)
pH, Ven: 7.368 (ref 7.25–7.43)
pO2, Ven: 35 mmHg (ref 32–45)
pO2, Ven: 36 mmHg (ref 32–45)

## 2022-02-06 LAB — BASIC METABOLIC PANEL
Anion gap: 10 (ref 5–15)
BUN: 31 mg/dL — ABNORMAL HIGH (ref 8–23)
CO2: 20 mmol/L — ABNORMAL LOW (ref 22–32)
Calcium: 8.7 mg/dL — ABNORMAL LOW (ref 8.9–10.3)
Chloride: 106 mmol/L (ref 98–111)
Creatinine, Ser: 1.99 mg/dL — ABNORMAL HIGH (ref 0.44–1.00)
GFR, Estimated: 26 mL/min — ABNORMAL LOW (ref >60)
Glucose, Bld: 95 mg/dL (ref 70–99)
Potassium: 3.9 mmol/L (ref 3.5–5.1)
Sodium: 136 mmol/L (ref 135–145)

## 2022-02-06 LAB — LIPID PANEL
Cholesterol: 188 mg/dL (ref 0–200)
HDL: 51 mg/dL (ref >40)
LDL Cholesterol: 104 mg/dL — ABNORMAL HIGH (ref 0–99)
Total CHOL/HDL Ratio: 3.7 RATIO
Triglycerides: 166 mg/dL — ABNORMAL HIGH (ref <150)
VLDL: 33 mg/dL (ref 0–40)

## 2022-02-06 LAB — SARS CORONAVIRUS 2 BY RT PCR (HOSPITAL ORDER, PERFORMED IN ~~LOC~~ HOSPITAL LAB): SARS Coronavirus 2: NEGATIVE

## 2022-02-06 IMAGING — MR MR CARD MORPHOLOGY WO/W CM
45 of 48 series · 45 of 48 positions shown · IV contrast (Contrast agent)
Comparison: none

CLINICAL DATA: Cardiac amyloidosis

EXAM:
CARDIAC MRI
TECHNIQUE: The patient was scanned on a 1.5 Tesla GE magnet. A dedicated
cardiac coil was used. Functional imaging was done using Fiesta
sequences. [DATE], and 4 chamber views were done to assess for RWMA's.
Modified ORRACA-TETTEH rule using a short axis stack was used to
calculate an ejection fraction on a dedicated work station using
[employer] software. The patient received 8 cc of Gadavist. After 10
minutes inversion recovery sequences were used to assess for
infiltration and scar tissue.

[Series 4: [id number]_bh · axial · 8.0mm · 1.48mm/px · 1 of 16 slices shown]
[im 1/16]
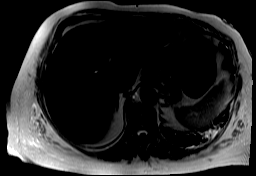

[Series 8: bSSFP · oblique · 8.0mm · 1.70mm/px · 1 of 25 slices shown (1 of 19)]
[im 1/25]
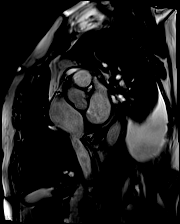

[Series 9: bSSFP · oblique · 8.0mm · 1.70mm/px · 1 of 25 slices shown (2 of 19)]
[im 1/25]
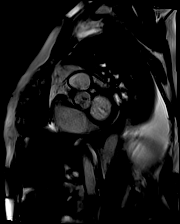

[Series 10: bSSFP · oblique · 8.0mm · 1.70mm/px · 1 of 25 slices shown (3 of 19)]
[im 1/25]
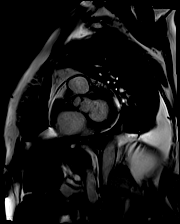

[Series 11: bSSFP · oblique · 8.0mm · 1.70mm/px · 1 of 25 slices shown (4 of 19)]
[im 1/25]
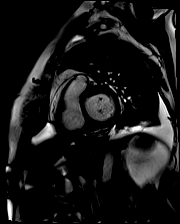

[Series 12: bSSFP · oblique · 8.0mm · 1.70mm/px · 1 of 25 slices shown (5 of 19)]
[im 1/25]
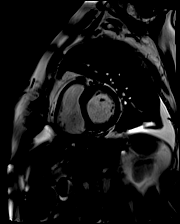

[Series 13: bSSFP · oblique · 8.0mm · 1.70mm/px · 1 of 25 slices shown (6 of 19)]
[im 1/25]
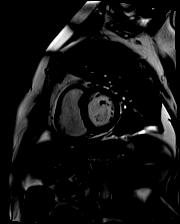

[Series 14: bSSFP · oblique · 8.0mm · 1.70mm/px · 1 of 25 slices shown (7 of 19)]
[im 1/25]
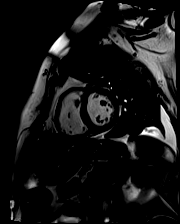

[Series 15: bSSFP · oblique · 8.0mm · 1.70mm/px · 1 of 25 slices shown (8 of 19)]
[im 1/25]
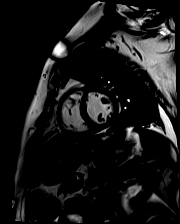

[Series 16: bSSFP · oblique · 8.0mm · 1.70mm/px · 1 of 25 slices shown (9 of 19)]
[im 1/25]
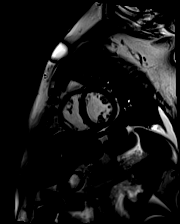

[Series 17: bSSFP · oblique · 8.0mm · 1.70mm/px · 1 of 25 slices shown (10 of 19)]
[im 1/25]
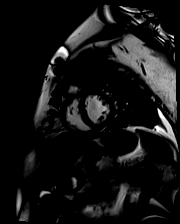

[Series 18: bSSFP · oblique · 8.0mm · 1.70mm/px · 1 of 25 slices shown (11 of 19)]
[im 1/25]
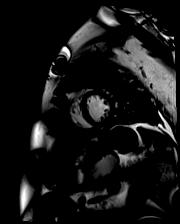

[Series 19: bSSFP · oblique · 8.0mm · 1.70mm/px · 1 of 25 slices shown (12 of 19)]
[im 1/25]
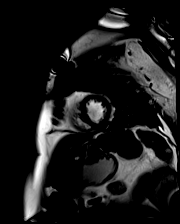

[Series 20: bSSFP · oblique · 8.0mm · 1.70mm/px · 1 of 25 slices shown (13 of 19)]
[im 1/25]
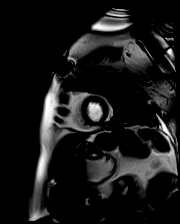

[Series 21: bSSFP · oblique · 8.0mm · 1.70mm/px · 1 of 25 slices shown (14 of 19)]
[im 1/25]
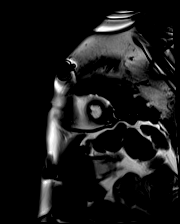

[Series 22: bSSFP · oblique · 8.0mm · 1.70mm/px · 1 of 25 slices shown (15 of 19)]
[im 1/25]
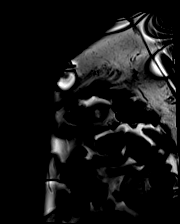

[Series 23: bSSFP · oblique · 8.0mm · 1.70mm/px · 1 of 25 slices shown (16 of 19)]
[im 1/25]
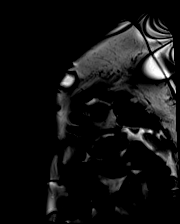

[Series 24: bSSFP · oblique · 6.0mm · 1.41mm/px · 1 of 25 slices shown (17 of 19)]
[im 1/25]
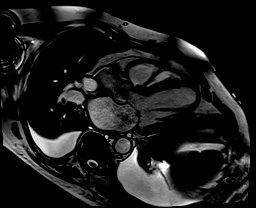

[Series 25: bSSFP · oblique · 6.0mm · 1.41mm/px · 1 of 25 slices shown (18 of 19)]
[im 1/25]
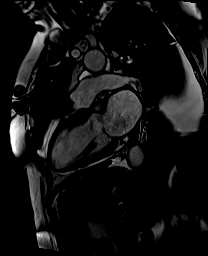

[Series 26: bSSFP · axial · 6.0mm · 1.41mm/px · 1 of 25 slices shown (19 of 19)]
[im 1/25]
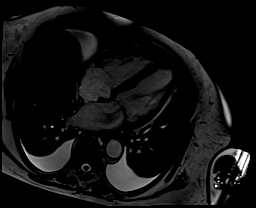

[Series 27: (id)_long_t1 · oblique · 8.0mm · 2.08mm/px · 1 of 24 slices shown]
[im 1/24]
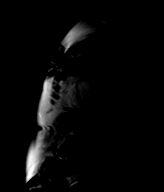

[Series 28: (id)_long_t1_moco · oblique · 8.0mm · 2.08mm/px · 1 of 24 slices shown]
[im 1/24]
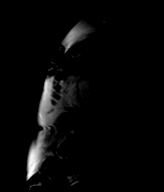

[Series 29: (id)_long_t1_moco_t1 · oblique · 8.0mm · 2.08mm/px · 1 of 3 slices shown]
[im 1/3]
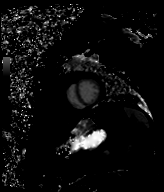

[Series 31: (id)_trufi · oblique · 8.0mm · 2.08mm/px · 1 of 9 slices shown]
[im 1/9]
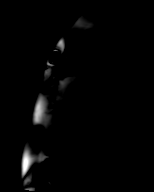

[Series 32: (id)_trufi_moco · oblique · 8.0mm · 2.08mm/px · 1 of 9 slices shown]
[im 1/9]
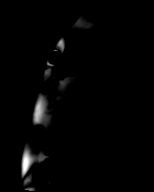

[Series 33: (id)_trufi_moco_t2 · oblique · 8.0mm · 2.08mm/px · 1 of 3 slices shown]
[im 1/3]
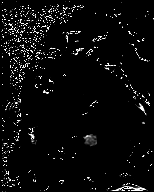

[Series 35: STIR · oblique · 8.0mm · 2.02mm/px · 1 of 13 slices shown (1 of 2)]
[im 1/13]
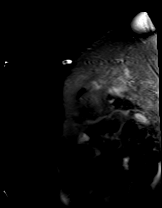

[Series 36: cine_trufi_cs_rt_short axis · oblique · 8.0mm · 1.73mm/px · 1 of 12 slices shown (1 of 14)]
[im 1/12]
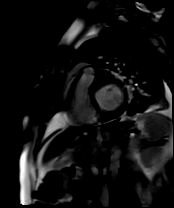

[Series 36: cine_trufi_cs_rt_short axis · oblique · 8.0mm · 1.73mm/px · 1 of 12 slices shown (2 of 14)]
[im 1/12]
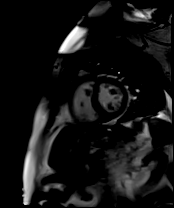

[Series 36: cine_trufi_cs_rt_short axis · oblique · 8.0mm · 1.73mm/px · 1 of 12 slices shown (3 of 14)]
[im 1/12]
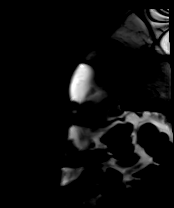

[Series 36: cine_trufi_cs_rt_short axis · oblique · 8.0mm · 1.73mm/px · 1 of 12 slices shown (4 of 14)]
[im 1/12]
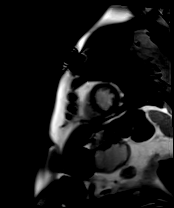

[Series 36: cine_trufi_cs_rt_short axis · oblique · 8.0mm · 1.73mm/px · 1 of 12 slices shown (5 of 14)]
[im 1/12]
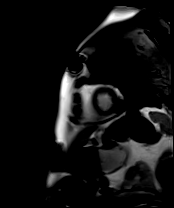

[Series 36: cine_trufi_cs_rt_short axis · oblique · 8.0mm · 1.73mm/px · 1 of 12 slices shown (6 of 14)]
[im 1/12]
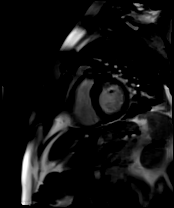

[Series 36: cine_trufi_cs_rt_short axis · oblique · 8.0mm · 1.73mm/px · 1 of 12 slices shown (7 of 14)]
[im 1/12]
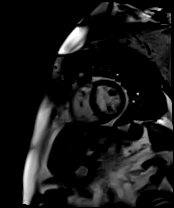

[Series 36: cine_trufi_cs_rt_short axis · oblique · 8.0mm · 1.73mm/px · 1 of 12 slices shown (8 of 14)]
[im 1/12]
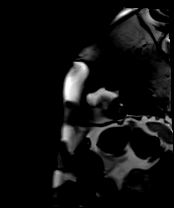

[Series 36: cine_trufi_cs_rt_short axis · oblique · 8.0mm · 1.73mm/px · 1 of 12 slices shown (9 of 14)]
[im 1/12]
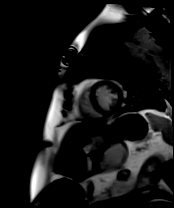

[Series 36: cine_trufi_cs_rt_short axis · oblique · 8.0mm · 1.73mm/px · 1 of 12 slices shown (10 of 14)]
[im 1/12]
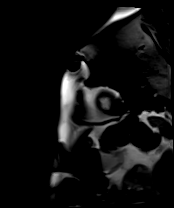

[Series 36: cine_trufi_cs_rt_short axis · oblique · 8.0mm · 1.73mm/px · 1 of 12 slices shown (11 of 14)]
[im 1/12]
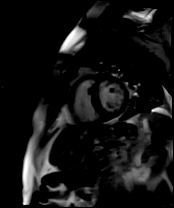

[Series 36: cine_trufi_cs_rt_short axis · oblique · 8.0mm · 1.73mm/px · 1 of 12 slices shown (12 of 14)]
[im 1/12]
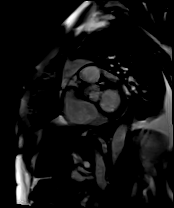

[Series 36: cine_trufi_cs_rt_short axis · oblique · 8.0mm · 1.73mm/px · 1 of 12 slices shown (13 of 14)]
[im 1/12]
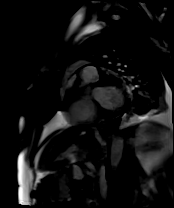

[Series 36: cine_trufi_cs_rt_short axis · oblique · 8.0mm · 1.73mm/px · 1 of 12 slices shown (14 of 14)]
[im 1/12]
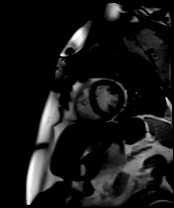

[Series 37: STIR · oblique · 8.0mm · 2.02mm/px · 1 of 8 slices shown (2 of 2)]
[im 1/8]
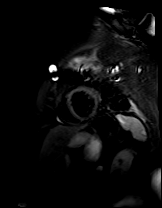

[Series 38: pre short axis · oblique · non-contrast · 8.0mm · 2.50mm/px · 1 of 10 slices shown (1 of 3)]
[im 1/10]
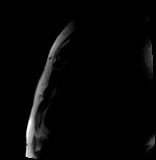

[Series 39: pre short axis · oblique · non-contrast · 8.0mm · 2.50mm/px · 1 of 10 slices shown (2 of 3)]
[im 1/10]
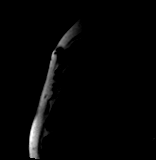

[Series 40: pre short axis · oblique · non-contrast · 8.0mm · 2.50mm/px · 1 of 10 slices shown (3 of 3)]
[im 1/10]
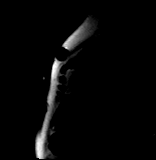

[45 of 48 positions shown; findings below may reference images not displayed]

FINDINGS: Limited images of the lung fields showed bilateral small to moderate
pleural effusions. There was right basilar airspace disease.

Normal left ventricular size and wall thickness. Normal wall motion
with EF 59%. Normal RV size and systolic function, EF 56%. Moderate
biatrial enlargement. Moderate mitral regurgitation with regurgitant
fraction 25%. Trileaflet aortic valve with partially fused left and
right coronary cusps. Probably mild aortic stenosis. Mild aortic
insufficiency, regurgitant fraction 7%.

Difficult delayed enhancement images. There appeared to be
subepicardial basal inferior and basal inferolateral late gadolinium
enhancement (LGE).

MEASUREMENTS:
MEASUREMENTS
LVEDV 165 mL

LVSV 96 mL
LVEF 59%

RVEDV 179 mL
RVSV 100 mL
RVEF 56%

Aortic forward volume 72 mL

Aortic regurgitant fraction 7%

Mitral regurgitant fraction 25%

T1 [0Y], ECV 49% (septum)

T2 measures not elevated.
IMPRESSION: 1.  Small to moderate pleural effusions.

2.  Normal LV size and wall thickness, EF 59%.

3.  Normal RV size and systolic function, EF 56%.

4. Noncoronary pattern of LGE in the subepicardial basal inferior
and basal inferolateral walls.
5.  Significantly elevated extracellular volume percentage, 49%.

The LV walls are not thickened as would be expected with
amyloidosis. Though there is LGE seen, it is in more of a
myocarditis pattern than an amyloidosis pattern. However, the septal
ECV percentage is quite high, 49%, which is suggestive of cardiac
amyloidosis.

ORRACA-TETTEH

## 2022-02-06 SURGERY — RIGHT HEART CATH

## 2022-02-06 MED ORDER — MIDODRINE HCL 5 MG PO TABS: 10.0000 mg | ORAL_TABLET | Freq: Three times a day (TID) | ORAL | Status: DC

## 2022-02-06 MED ORDER — MIDODRINE HCL 5 MG PO TABS
10.0000 mg | ORAL_TABLET | Freq: Three times a day (TID) | ORAL | Status: DC
  Administered 2022-02-06 – 2022-02-13 (×15): 10 mg via ORAL
  Filled 2022-02-06 (×17): qty 2

## 2022-02-06 MED ORDER — MIDODRINE HCL 5 MG PO TABS: 5.0000 mg | ORAL_TABLET | Freq: Three times a day (TID) | ORAL | Status: DC

## 2022-02-06 MED ORDER — LIDOCAINE HCL (PF) 1 % IJ SOLN
INTRAMUSCULAR | Status: AC
  Filled 2022-02-06: qty 30

## 2022-02-06 MED ORDER — MIDAZOLAM HCL 2 MG/2ML IJ SOLN
INTRAMUSCULAR | Status: AC
  Filled 2022-02-06: qty 2

## 2022-02-06 MED ORDER — HEPARIN (PORCINE) IN NACL 1000-0.9 UT/500ML-% IV SOLN
INTRAVENOUS | Status: DC | PRN
  Administered 2022-02-06: 500 mL

## 2022-02-06 MED ORDER — SODIUM CHLORIDE 0.9% FLUSH: 3.0000 mL | INTRAVENOUS | Status: DC | PRN

## 2022-02-06 MED ORDER — FUROSEMIDE 10 MG/ML IJ SOLN: 80.0000 mg | Freq: Once | INTRAMUSCULAR | Status: DC

## 2022-02-06 MED ORDER — HEPARIN SODIUM (PORCINE) 5000 UNIT/ML IJ SOLN
5000.0000 [IU] | Freq: Three times a day (TID) | INTRAMUSCULAR | Status: DC
  Administered 2022-02-06 – 2022-02-15 (×27): 5000 [IU] via SUBCUTANEOUS
  Filled 2022-02-06 (×27): qty 1

## 2022-02-06 MED ORDER — DOXYCYCLINE HYCLATE 100 MG PO TABS
100.0000 mg | ORAL_TABLET | Freq: Two times a day (BID) | ORAL | Status: DC
  Administered 2022-02-06 – 2022-02-09 (×6): 100 mg via ORAL
  Filled 2022-02-06 (×7): qty 1

## 2022-02-06 MED ORDER — POTASSIUM CHLORIDE CRYS ER 20 MEQ PO TBCR
40.0000 meq | EXTENDED_RELEASE_TABLET | Freq: Once | ORAL | Status: AC
  Administered 2022-02-06: 40 meq via ORAL
  Filled 2022-02-06: qty 2

## 2022-02-06 MED ORDER — MIDAZOLAM HCL 2 MG/2ML IJ SOLN
INTRAMUSCULAR | Status: DC | PRN
  Administered 2022-02-06: 1 mg via INTRAVENOUS

## 2022-02-06 MED ORDER — ONDANSETRON HCL 4 MG/2ML IJ SOLN
4.0000 mg | Freq: Four times a day (QID) | INTRAMUSCULAR | Status: DC | PRN
  Administered 2022-02-06 – 2022-02-10 (×9): 4 mg via INTRAVENOUS
  Filled 2022-02-06 (×10): qty 2

## 2022-02-06 MED ORDER — CHLORHEXIDINE GLUCONATE 0.12 % MT SOLN
15.0000 mL | Freq: Two times a day (BID) | OROMUCOSAL | Status: DC
  Administered 2022-02-06 – 2022-02-15 (×19): 15 mL via OROMUCOSAL
  Filled 2022-02-06 (×19): qty 15

## 2022-02-06 MED ORDER — FENTANYL CITRATE (PF) 100 MCG/2ML IJ SOLN
INTRAMUSCULAR | Status: AC
  Filled 2022-02-06: qty 2

## 2022-02-06 MED ORDER — LIDOCAINE HCL (PF) 1 % IJ SOLN
INTRAMUSCULAR | Status: DC | PRN
  Administered 2022-02-06: 2 mL via INTRADERMAL

## 2022-02-06 MED ORDER — MIDODRINE HCL 5 MG PO TABS: 2.5000 mg | ORAL_TABLET | Freq: Three times a day (TID) | ORAL | Status: DC

## 2022-02-06 MED ORDER — GADOBUTROL 1 MMOL/ML IV SOLN
10.0000 mL | Freq: Once | INTRAVENOUS | Status: AC | PRN
  Administered 2022-02-06: 10 mL via INTRAVENOUS

## 2022-02-06 MED ORDER — LABETALOL HCL 5 MG/ML IV SOLN: 10.0000 mg | INTRAVENOUS | Status: DC | PRN

## 2022-02-06 MED ORDER — ORAL CARE MOUTH RINSE
15.0000 mL | Freq: Two times a day (BID) | OROMUCOSAL | Status: DC
  Administered 2022-02-06 – 2022-02-15 (×14): 15 mL via OROMUCOSAL

## 2022-02-06 MED ORDER — HEPARIN (PORCINE) IN NACL 1000-0.9 UT/500ML-% IV SOLN
INTRAVENOUS | Status: AC
  Filled 2022-02-06: qty 500

## 2022-02-06 MED ORDER — FENTANYL CITRATE (PF) 100 MCG/2ML IJ SOLN
INTRAMUSCULAR | Status: DC | PRN
Start: 2022-02-06 — End: 2022-02-06
  Administered 2022-02-06: 25 ug via INTRAVENOUS

## 2022-02-06 MED ORDER — SODIUM CHLORIDE 0.9 % IV SOLN
250.0000 mL | INTRAVENOUS | Status: DC | PRN
  Administered 2022-02-07: 250 mL via INTRAVENOUS

## 2022-02-06 MED ORDER — FUROSEMIDE 10 MG/ML IJ SOLN
80.0000 mg | Freq: Two times a day (BID) | INTRAMUSCULAR | Status: DC
  Administered 2022-02-06 – 2022-02-09 (×7): 80 mg via INTRAVENOUS
  Filled 2022-02-06 (×7): qty 8

## 2022-02-06 MED ORDER — SODIUM CHLORIDE 0.9% FLUSH
3.0000 mL | Freq: Two times a day (BID) | INTRAVENOUS | Status: DC
  Administered 2022-02-06: 3 mL via INTRAVENOUS

## 2022-02-06 MED ORDER — HYDRALAZINE HCL 20 MG/ML IJ SOLN: 10.0000 mg | INTRAMUSCULAR | Status: AC | PRN

## 2022-02-06 MED ORDER — FUROSEMIDE 10 MG/ML IJ SOLN: 80.0000 mg | Freq: Two times a day (BID) | INTRAMUSCULAR | Status: DC

## 2022-02-06 MED ORDER — ACETAMINOPHEN 325 MG PO TABS
650.0000 mg | ORAL_TABLET | ORAL | Status: DC | PRN
  Filled 2022-02-06: qty 2

## 2022-02-06 SURGICAL SUPPLY — 7 items
CATH BALLN WEDGE 5F 110CM (CATHETERS) ×1 IMPLANT
GUIDEWIRE .025 260CM (WIRE) ×1 IMPLANT
KIT HEART LEFT (KITS) ×1 IMPLANT
PACK CARDIAC CATHETERIZATION (CUSTOM PROCEDURE TRAY) ×1 IMPLANT
PROTECTION STATION PRESSURIZED (MISCELLANEOUS) ×2
SHEATH GLIDE SLENDER 4/5FR (SHEATH) ×1 IMPLANT
STATION PROTECTION PRESSURIZED (MISCELLANEOUS) IMPLANT

## 2022-02-06 NOTE — Progress Notes (Signed)
Mobility Specialist Progress Note:   02/06/22 1245  Orthostatic Lying   BP- Lying 103/68  Orthostatic Sitting  BP- Sitting 95/61  Orthostatic Standing at 0 minutes  BP- Standing at 0 minutes 93/56  Orthostatic Standing at 3 minutes  BP- Standing at 3 minutes 100/51  Mobility  Activity Stood at bedside  Level of Assistance Contact guard assist, steadying assist  Assistive Device Front wheel walker  Distance Ambulated (ft) 6 ft  Activity Response Tolerated fair  $Mobility charge 1 Mobility   Pt eager for OOB mobility. Still with symptomatic orthostatic hypertension this am. Pt marched in place at bedside, BP dropped to 70/42. Further mobility deferred. Pt back in bed with all needs met, student-RN in room.  [facility] Phone: [phone] Office Phone: [phone]

## 2022-02-06 NOTE — CV Procedure (Signed)
Right heart cath performed via right antecubital vein. Moderate to moderately severe pulmonary hypertension with phasic pulmonary artery pressure 55/27 and mean PA pressure 38 mmHg.  Mean pulmonary capillary wedge pressure 23 mmHg.  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 Fick cardiac output 7.5 L/min, cardiac index 3.7 L/min Elevated right atrial pressure with mean 16 mmHg. PAPI: 55-27 /16 = 28/16=  1.75
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
## 2022-02-06 NOTE — Consult Note (Signed)
NEURO HOSPITALIST CONSULT NOTE   Requestig physician: Dr. Caryl Comes  Reason for Consult: Orthostatic hypotension, peripheral neuropathy and worsening memory  History obtained from:   Patient and Chart     HPI:                                                                                                                                          [name] is an 76 y.o. female with orthostatic hypotension, RLS, peripheral neuropathy, OSA on CPAP, dystonia, cervicalgia, ophthalmic migraines, gastric bypass surgery, cryptogenic stroke 5+ years ago and recently worsening memory symptoms who was admitted on 2/20 for evaluation of 3 weeks of significant worsening exertional dyspnea relieved by rest, which was associated with a nonradiating chest tightness. She has also had new symptoms over the past 3 months of dizziness upon standing and has fallen, breaking her wrist on one of these occasions. Cardiology work up so far has been negative. Orthostatics performed yesterday were positive. Cardiology suspects a possible neurodegenerative disorder as an etiology for her combined memory problems and orthostatic hypotension. Neurology was consulted to further evaluate.  Patient reports her neurological symptoms started last May when she had the shoulder injury after a fall. Since then, patient has had more frequent syncopal episodes. Patient reports never losing consciousness, just feeling like she will. In addition, patient reports neuropathy has progressed more proximally and involves her hands as well. Patient reports 3 weeks ago she began to have difficulty standing up. Patient felt she was unable to lift her legs up. Patient also feels that her legs have felt especially restless today.  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   Arthritis    generalized   Asthma    Atrophic vaginitis    Back pain    DDD/stenosis   Carotid  stenosis    Carotid US 10/16: Plaque RICA (6-75%), normal LICA   Depression    takes Cymbalta daily   Diverticulosis    benign   DJD (degenerative joint disease)    Dyslipidemia    Dysrhythmia    Family history of GI bleeding    GERD (gastroesophageal reflux disease)    takes Nexium daily   Gestational diabetes    Pt denies   H/O hiatal hernia    surgery for hernia   Headache(784.0)    takes Imitrex daily as needed and Bisoprolol daily;last migraine was about 2wks ago   History of bronchitis 1 yr ago   History of shingles    Insomnia    takes Ambien nightly   Joint pain    Joint swelling    Leg cramps    takes Flexeril daily as needed   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  Nocturia    Osteoporosis    Peripheral neuropathy    takes Gabapentin daily   Pneumonia 39yrs ago   hx of   PONV (postoperative nausea and vomiting)    Restless leg syndrome    takes Requip daily   RLS (restless legs syndrome) 08/16/2015   Sleep apnea    uses CPAP   Stroke ([place])    Tubular adenoma of colon    Vertigo    Vitamin D deficiency     Past Surgical History:  Procedure Laterality Date   COLONOSCOPY     EP IMPLANTABLE DEVICE N/A 11/19/2016   Procedure: Loop Recorder Insertion;  Surgeon: Deboraha Sprang, MD;  Location: [facility];  Service: Cardiovascular;  Laterality: N/A;   ESOPHAGOGASTRODUODENOSCOPY     excess skin removal     post weight loss   GASTRIC BYPASS  2001   HERNIA REPAIR     umbilical   JOINT REPLACEMENT Right    knee   REVERSE SHOULDER ARTHROPLASTY Right 04/25/2021   Procedure: REVERSE SHOULDER ARTHROPLASTY carpal tunel realase right wrist;  Surgeon: Tania Ade, MD;  Location: [facility];  Service: Orthopedics;  Laterality: Right;   skin reduction  2003 2004   stomach and arm   STEROID INJECTION TO SCAR     x 2 in back    TOTAL KNEE ARTHROPLASTY Left 04/03/2015   Procedure: LEFT TOTAL KNEE ARTHROPLASTY;  Surgeon: Melrose Nakayama, MD;  Location: [facility];  Service:  Orthopedics;  Laterality: Left;   WISDOM TOOTH EXTRACTION      Family History  Problem Relation Age of Onset   CAD Mother    Hypertension Mother    Neuropathy Mother    Osteoarthritis Mother    Heart disease Mother    Breast cancer Maternal Grandmother    CAD Paternal Grandfather    Colon cancer Father               Social History:  reports that she has never smoked. She has never used smokeless tobacco. She reports current alcohol use. She reports that she does not use drugs.  Allergies  Allergen Reactions   Atorvastatin     Other reaction(s): myalgia   Crestor [Rosuvastatin]     Other reaction(s): myalgia   Ezetimibe     Other reaction(s): myalgia   Amitriptyline Rash    MEDICATIONS:                                                                                                                     I have reviewed the patient's current medications.   ROS:  History obtained from the patient  General ROS: negative for - chills, fatigue, fever, night sweats, weight gain or weight loss Psychological ROS: negative for - behavioral disorder, hallucinations, memory difficulties, mood swings or suicidal ideation Ophthalmic ROS: negative for - blurry vision, double vision, eye pain or loss of vision ENT ROS: negative for - epistaxis, nasal discharge, oral lesions, sore throat, tinnitus or vertigo Allergy and Immunology ROS: negative for - hives or itchy/watery eyes Hematological and Lymphatic ROS: negative for - bleeding problems, bruising or swollen lymph nodes Endocrine ROS: negative for - galactorrhea, hair pattern changes, polydipsia/polyuria or temperature intolerance Respiratory ROS: negative for - cough, hemoptysis, shortness of breath or wheezing Cardiovascular ROS: negative for - chest pain, dyspnea on exertion, edema or irregular  heartbeat Gastrointestinal ROS: negative for - abdominal pain, diarrhea, hematemesis, nausea/vomiting or stool incontinence Genito-Urinary ROS: negative for - dysuria, hematuria, incontinence or urinary frequency/urgency Musculoskeletal ROS: negative for - joint swelling or muscular weakness Neurological ROS: as noted in HPI Dermatological ROS: negative for rash and skin lesion changes   Blood pressure (!) 102/51, pulse 78, temperature 97.9 F (36.6 C), temperature source Oral, resp. rate 16, weight 91.5 kg, SpO2 100 %.   General Examination:                                                                                                       Physical Exam  HEENT-  Normocephalic, no lesions, without obvious abnormality.  Normal external eye and conjunctiva.   Cardiovascular- S1-S2 audible, pulses palpable throughout   Lungs-no rhonchi or wheezing noted, no excessive working breathing.  Saturations within normal limits Abdomen- All 4 quadrants palpated and nontender Extremities- Warm, dry and intact Musculoskeletal-no joint tenderness, deformity or swelling Skin-warm and dry, no hyperpigmentation, vitiligo, or suspicious lesions  Neurological Examination Mental Status: Alert, oriented, thought content appropriate.  Speech fluent without evidence of aphasia.  Able to follow 3 step commands without difficulty. Cranial Nerves: II: Visual fields grossly normal,  III,IV, VI: ptosis not present, extra-ocular motions intact bilaterally pupils equal, round, reactive to light and accommodation V,VII: smile symmetric, facial light touch sensation normal bilaterally VIII: hearing normal bilaterally IX,X: No hypophonia XI: bilateral shoulder shrug XII: midline tongue extension Motor: Right : Upper extremity   4/5 Left     Upper extremity   4/5  Lower extremity   3/5  Lower extremity  3/5 Tone and bulk:normal tone throughout; no atrophy noted Sensory: Symmetric numbness in BLE and hands. No  sensory deficits in proximal upper extremities noted Deep Tendon Reflexes: 1+ Left Biceps and Triceps. 2+ R triceps. 1+ patellar bilaterally Plantars: Right: downgoing   Left: downgoing Cerebellar: normal finger-to-nose Gait: deferred   Lab Results: Basic Metabolic Panel: Recent Labs  Lab 02/03/22 1608 02/04/22 0120 02/04/22 1445 02/05/22 0342 02/06/22 0358  NA 136 132* 135 136 136  K 3.3* 3.7 3.5 3.5 3.9  CL 106 103 104 104 106  CO2 19* 18* 19* 20* 20*  GLUCOSE 95 99 100* 101* 95  BUN 29* 34* 32* 31* 31*  CREATININE 1.74* 1.93*  1.96* 1.80* 1.99*  CALCIUM 7.9* 8.1* 8.4* 8.7* 8.7*  MG 2.1  --   --   --   --     CBC: Recent Labs  Lab 02/03/22 1608 02/04/22 0120 02/05/22 0342 02/06/22 0358  WBC 7.3 7.9 6.6 7.2  NEUTROABS 5.5  --   --   --   HGB 8.4* 8.9* 8.8* 9.1*  HCT 26.9* 27.2* 27.9* 27.9*  MCV 101.1* 99.6 100.0 99.3  PLT 315 304 284 272    Cardiac Enzymes: No results for input(s): CKTOTAL, CKMB, CKMBINDEX, TROPONINI in the last 168 hours.  Lipid Panel: Recent Labs  Lab 02/06/22 0358  CHOL 188  TRIG 166*  HDL 51  CHOLHDL 3.7  VLDL 33  LDLCALC 104*    Imaging: NM Myocar Multi W/Spect W/Wall Motion / EF  Result Date: 02/05/2022 CLINICAL DATA:  Chest pain.  Shortness of breath.  Dizziness. EXAM: MYOCARDIAL IMAGING WITH SPECT (REST AND PHARMACOLOGIC-STRESS) GATED LEFT VENTRICULAR WALL MOTION STUDY LEFT VENTRICULAR EJECTION FRACTION TECHNIQUE: Standard myocardial SPECT imaging was performed after resting intravenous injection of 10.6 mCi Tc-76m tetrofosmin. Subsequently, intravenous infusion of Lexiscan was performed under the supervision of the Cardiology staff. At peak effect of the drug, 30 mCi Tc-15m tetrofosmin was injected intravenously and standard myocardial SPECT imaging was performed. Quantitative gated imaging was also performed to evaluate left ventricular wall motion, and estimate left ventricular ejection fraction. COMPARISON:  Chest radiograph  02/04/2022.  CTA chest 02/04/2022. FINDINGS: Perfusion: No decreased activity in the left ventricle on stress imaging to suggest reversible ischemia or infarction. Wall Motion: Normal left ventricular wall motion. No left ventricular dilation. Left Ventricular Ejection Fraction: 50 % End diastolic volume 631 ml End systolic volume 52 ml IMPRESSION: 1. No reversible ischemia or infarction. 2. Normal left ventricular wall motion. 3. Left ventricular ejection fraction 50% 4. Non invasive risk stratification*: Low *2012 Appropriate Use Criteria for Coronary Revascularization Focused Update: J Am Coll Cardiol. 4970;26(3):785-885. http://content.airportbarriers.com.aspx?articleid=[email or web address] Electronically Signed   By: Abigail Miyamoto M.D.   On: 02/05/2022 16:24     Assessment: 76 year old female with orthostatic hypotension, RLS, peripheral neuropathy and recently worsening memory symptoms who was admitted on 2/20 for evaluation of 3 weeks of significant worsening exertional dyspnea relieved by rest, which was associated with a nonradiating chest tightness. She has also had new symptoms over the past 3 months of dizziness upon standing and has fallen, breaking her wrist on one of these occasions. Cardiology work up so far has been negative. Orthostatics performed yesterday were positive. Cardiology suspects a possible neurodegenerative disorder as an etiology for her combined memory problems and orthostatic hypotension. Neurology was consulted to further evaluate. Patient is on multiple medications that can cause orthostatic hypotension including bisoprolol (held), cymbalta, gabapentin (300 mg qid). 1. Exam reveals hyporeflexia and reduced sensation in hands and BLE 2. Orthostatic blood pressures:  - Sitting: BP 110/60 - Standing at 0 minutes: 94/59 - Standing at 3 minutes: 75/56 - Pt becomes shaky and dizzy with 3 min of standing and was already experiencing some dizziness while sitting 3. DDx: - The patient's  presentation is most consistent with an Autonomic Neuropathy. The underlying process may overlap with her pre-existing peripheral neuropathy.  - No Parkinsonian features on exam to militate in favor of Multiple System Atrophy.  - Although she has been having some worsened memory, overall presentation is not suggestive of Lewy body dementia.  - Has intact reflexes, therefore AIDP/CIDP are unlikely. - Peripheral and autonomic neuropathy could  be secondary to a paraneoplastic process. Would obtain chest/abdomen/pelvis CT to rule out.    Recommendations: 1. Limit medications that can worsen orthostatic hypotension 2. Recommend outpatient autonomic neuropathy  testing 3. B6, Folate, B12 levels 4. Hydrate well to determine if there is some improvement in her symptoms. She does not appear dry, so unlikely that IVF will significantly improve her orthostatic blood pressure drops.  5. Agree with PT and compression stockings.  6. Midodrine trial is indicated if symptoms do not improve with IV hydration and compression stockings.  7. CT of chest/abdomen/pelvis to rule out neoplasm.  8. Outpatient follow up will need to be set up with the [facility], which also has expertise with diagnosis and management of autonomic neuropathies. They can be reached at [phone]. A paraneoplastic panel and other labs to assess for the etiology of her peripheral neuropathy with possible overlapping autonomic neuropathy may be indicated, but will defer to the team at [facility] for the specific lab tests to be ordered.     Electronically signed: Dr. Kerney Elbe 02/06/2022, 8:57 AM

## 2022-02-06 NOTE — Progress Notes (Signed)
PIV consult: Discussed request for R [facility] site with [place], Therapist, sports. Avoiding RUE PIVs due to cast to wrist/forearm. Second PIV placed L AC.

## 2022-02-06 NOTE — Interval H&P Note (Signed)
Cath Lab Visit (complete for each Cath Lab visit)  Clinical Evaluation Leading to the Procedure:   ACS: No.  Non-ACS:    Anginal Classification: No Symptoms  Anti-ischemic medical therapy: No Therapy  Non-Invasive Test Results: Low-risk stress test findings: cardiac mortality <1%/year  Prior CABG: No previous CABG      History and Physical Interval Note:  02/06/2022 10:02 AM  [name]  has presented today for surgery, with the diagnosis of PULMONARY HTN.  The various methods of treatment have been discussed with the patient and family. After consideration of risks, benefits and other options for treatment, the patient has consented to  Procedure(s): RIGHT HEART CATH (N/A) as a surgical intervention.  The patient's history has been reviewed, patient examined, no change in status, stable for surgery.  I have reviewed the patient's chart and labs.  Questions were answered to the patient's satisfaction.     [name] III

## 2022-02-07 ENCOUNTER — Inpatient Hospital Stay (HOSPITAL_COMMUNITY): Payer: Medicare Other

## 2022-02-07 DIAGNOSIS — I5033 Acute on chronic diastolic (congestive) heart failure: Secondary | ICD-10-CM | POA: Diagnosis not present

## 2022-02-07 LAB — BASIC METABOLIC PANEL
Anion gap: 9 (ref 5–15)
BUN: 34 mg/dL — ABNORMAL HIGH (ref 8–23)
CO2: 21 mmol/L — ABNORMAL LOW (ref 22–32)
Calcium: 8.9 mg/dL (ref 8.9–10.3)
Chloride: 108 mmol/L (ref 98–111)
Creatinine, Ser: 2.04 mg/dL — ABNORMAL HIGH (ref 0.44–1.00)
GFR, Estimated: 25 mL/min — ABNORMAL LOW (ref >60)
Glucose, Bld: 91 mg/dL (ref 70–99)
Potassium: 4.8 mmol/L (ref 3.5–5.1)
Sodium: 138 mmol/L (ref 135–145)

## 2022-02-07 IMAGING — NM NM SCAN TUMOR LOCALIZE WITH SPECT
4 series · 19 of 19 positions shown · non-contrast
Comparison: none

CLINICAL DATA: Cardiomyopathy

EXAM:
NUCLEAR MEDICINE TUMOR LOCALIZATION. PYP CARDIAC AMYLOIDOSIS SCAN
WITH SPECT
TECHNIQUE: Following intravenous administration of radiopharmaceutical,
anterior planar images of the chest were obtained. Regions of
interest were placed on the heart and contralateral chest wall for
quantitative assessment. Additional SPECT imaging of the chest was
obtained.
RADIOPHARMACEUTICALS:  20.4 mCi TECHNETIUM 99 PYROPHOSPHATE

[Series 1: ant-post · 4.14mm/px · 1 of 1 slices shown]
[im 1/1]
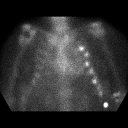

[Series 1: spect - (id)_(id)_tra · 4.1mm · 4.14mm/px · 6 of 128 frames shown]
[frame 11/128]
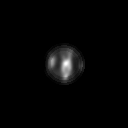
[frame 32/128]
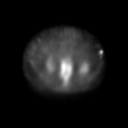
[frame 54/128]
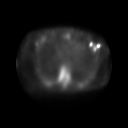
[frame 75/128]
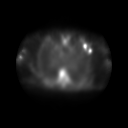
[frame 96/128]
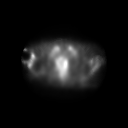
[frame 118/128]
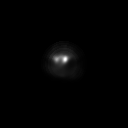

[Series 1: spect - (id)_(id)_cor · 4.1mm · 4.14mm/px · 6 of 128 frames shown]
[frame 11/128]
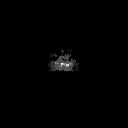
[frame 32/128]
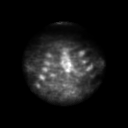
[frame 54/128]
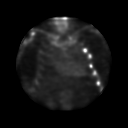
[frame 75/128]
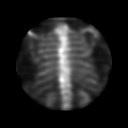
[frame 96/128]
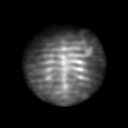
[frame 118/128]
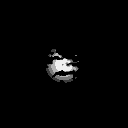

[Series 2: amyloid · 4.14mm/px · 6 of 64 frames shown]
[frame 6/64]
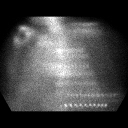
[frame 16/64]
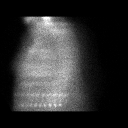
[frame 27/64]
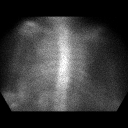
[frame 38/64]
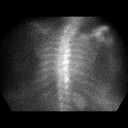
[frame 48/64]
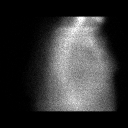
[frame 59/64]
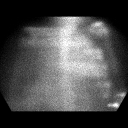

[19 of 19 positions shown; findings below may reference images not displayed]

FINDINGS: Planar Visual assessment:

Anterior planar imaging demonstrates radiotracer uptake within the
heart less than uptake within the adjacent ribs (Grade 1).

Quantitative assessment :

Quantitative assessment of the cardiac uptake compared to the
contralateral chest wall is equal to 1.16 (H/CL = 1.16).

SPECT assessment: SPECT imaging of the chest demonstrates faint
radiotracer accumulation within the LEFT ventricle.
IMPRESSION: Visual and quantitative assessment (grade 1, H/CL equal 1.16) are
equivocally suggestive of transthyretin amyloidosis.

## 2022-02-07 MED ORDER — SODIUM CHLORIDE 0.9 % IV SOLN: 2.0000 g | Freq: Two times a day (BID) | INTRAVENOUS | Status: DC

## 2022-02-07 MED ORDER — SODIUM CHLORIDE 0.9 % IV SOLN
2.0000 g | Freq: Two times a day (BID) | INTRAVENOUS | Status: DC
  Administered 2022-02-07 – 2022-02-09 (×4): 2 g via INTRAVENOUS
  Filled 2022-02-07 (×4): qty 2

## 2022-02-07 MED ORDER — TECHNETIUM TC 99M PYROPHOSPHATE
20.4000 | Freq: Once | INTRAVENOUS | Status: AC | PRN
  Administered 2022-02-07: 20.4 via INTRAVENOUS
  Filled 2022-02-07: qty 21

## 2022-02-07 MED ORDER — METOLAZONE 2.5 MG PO TABS
2.5000 mg | ORAL_TABLET | Freq: Once | ORAL | Status: AC
  Administered 2022-02-07: 2.5 mg via ORAL
  Filled 2022-02-07: qty 1

## 2022-02-07 MED ORDER — BENZOCAINE 10 % MT GEL
Freq: Four times a day (QID) | OROMUCOSAL | Status: DC | PRN
  Filled 2022-02-07: qty 9

## 2022-02-07 MED ORDER — SODIUM CHLORIDE 0.9 % IV SOLN: 2.0000 g | INTRAVENOUS | Status: DC

## 2022-02-07 NOTE — Progress Notes (Signed)
Mobility Specialist Progress Note:   02/07/22 1018  Therapy Vitals  Resp 19  BP (!) 99/58  Patient Position (if appropriate) Sitting (after walking back from the bathroom\)  Mobility  Activity Ambulated with assistance to bathroom  Level of Assistance Contact guard assist, steadying assist  Assistive Device Front wheel walker  Distance Ambulated (ft) 15 ft  Activity Response Tolerated fair  $Mobility charge 1 Mobility   Pt received in BR with NT. Pt c/o shaky/jerky movement this am. BP 115/56 sitting, dropped to 99/58 after ambulating back to bed. Pt left sitting EOB with NT present.   Nelta Numbers Acute Rehab Phone: [phone] Office Phone: [phone]

## 2022-02-07 NOTE — TOC Initial Note (Signed)
Transition of Care [facility]) - Initial/Assessment Note    Patient Details  Name: [name] MRN: [id number] Date of Birth: [birth year]  Transition of [facility]) CM/SW Contact:    Erenest Rasher, RN Phone Number: [phone] 02/07/2022, 4:37 PM  Clinical Narrative:                  HF TOC CM spoke to pt and husband. She wants IP rehab/SNF rehab at dc. She had some falls at home. Gave permission to create FL2 and fax referral for SNF. Husband wants [facility] SNF. Will need PT/OT recommendations.     Expected Discharge Plan: Skilled Nursing Facility Barriers to Discharge: Continued Medical Work up   Patient Goals and CMS Choice Patient states their goals for this hospitalization and ongoing recovery are:: wants to get stronger before she goes home CMS Medicare.gov Compare Post Acute Care list provided to:: Patient Choice offered to / list presented to : Patient  Expected Discharge Plan and Services Expected Discharge Plan: [place]   Discharge Planning Services: CM Consult Post Acute Care Choice: [place] arrangements for the past 2 months: Single Family Home                           HH Arranged: RN, PT [facility] Agency: [place] ([facility]) Date HH Agency Contacted: 02/07/22 Time [place]: 1636 Representative spoke with at [place]: [place] Arrangements/Services Living arrangements for the past 2 months: [place] Lives with:: Spouse Patient language and need for interpreter reviewed:: Yes Do you feel safe going back to the place where you live?: Yes      Need for Family Participation in Patient Care: Yes (Comment) Care giver support system in place?: Yes (comment) Current home services: DME Criminal Activity/Legal Involvement Pertinent to Current Situation/Hospitalization: No - Comment as needed  Activities of Daily Living      Permission Sought/Granted Permission sought to share information  with : Case Manager, Family Supports, PCP Permission granted to share information with : Yes, Verbal Permission Granted  Share Information with NAME: Broadus John Szczerba  Permission granted to share info w AGENCY: SNF, [place] granted to share info w Relationship: husband  Permission granted to share info w Contact Information: [phone]  Emotional Assessment   Attitude/Demeanor/Rapport: Gracious Affect (typically observed): Accepting Orientation: : Oriented to Self, Oriented to Place, Oriented to  Time, Oriented to Situation   Psych Involvement: No (comment)  Admission diagnosis:  Dyspnea [R06.00] Patient Active Problem List   Diagnosis Date Noted   Acute on chronic diastolic CHF (congestive heart failure) (HCC)    Dyspnea 02/03/2022   Non-intractable cyclical vomiting with nausea 01/06/2022   Autonomic sensory neuropathy 01/06/2022   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 07/08/2021   Chest pain on breathing 06/20/2021   Symptom of leg swelling 06/20/2021   Shortness of breath 06/20/2021   Attention deficit hyperactivity disorder 04/05/2021   Disorder of lip 04/05/2021   Essential hypertension 04/05/2021   Gout 04/05/2021   History of cerebrovascular accident 04/05/2021   Lumbar spondylosis 04/05/2021   Migraine 04/05/2021   Other allergic rhinitis 04/05/2021   Other long term (current) drug therapy 04/05/2021   Pure hypercholesterolemia 04/05/2021   Rotator cuff tear 04/05/2021   Skin sensation disturbance 04/05/2021   Vitamin B12 deficiency (non anemic) 04/05/2021   Vitamin D deficiency 04/05/2021   Acquired iron deficiency anemia  due to decreased absorption 04/24/2020   Low ferritin 03/27/2020   Status post gastric bypass for obesity 11/23/2019   Status post bariatric surgery 02/02/2018   Other symptoms and signs involving the nervous system 02/02/2018   Familial hypercholesterolemia 01/22/2018   Paroxysmal atrial fibrillation ([place])  01/22/2018   Torticollis, acquired 10/23/2016   Asthma 08/04/2016   Other secondary pulmonary hypertension ([place]) 06/11/2016   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   Chronic migraine without aura without status migrainosus, not intractable 03/19/2016   Chronic migraine without aura, with intractable migraine, so stated, with status migrainosus 01/22/2016   History of gastric bypass 12/05/2015   RLS (restless legs syndrome) 08/16/2015   Vitamin B12 deficiency anemia due to intrinsic factor deficiency 08/16/2015   Iron deficiency anemia 08/16/2015   Osteoporosis 08/16/2015   OSA on CPAP 08/16/2015   Primary osteoarthritis of left knee 04/03/2015   Obstructive sleep apnea 10/19/2014   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   Hyperlipidemia 10/19/2014   Morbid obesity ([place]) 10/19/2014   PCP:  [name], DO Pharmacy:   [facility] [place], [place] AT [address] [place] [address] Phone: [phone] Fax: [phone]  [id number] [place], [address] AT [address] [place] [address] Phone: [phone] Fax: [place] [address] [place] [address] Phone: [phone] Fax: [phone]     Social Determinants of Health ([place]) Interventions    Readmission Risk Interventions No flowsheet data found.

## 2022-02-07 NOTE — Progress Notes (Signed)
24 hour urine collection started at 9 PM.

## 2022-02-07 NOTE — Care Management Important Message (Signed)
Important Message  Patient Details  Name: [name] MRN: [id number] Date of Birth: January 08, 1946   Medicare Important Message Given:  Yes     Hannah Beat 02/07/2022, 2:33 PM

## 2022-02-07 NOTE — Progress Notes (Signed)
Pharmacy Antibiotic Note  [name] is a 76 y.o. female admitted on 02/03/2022 with SOB and now presented with  cellulitis with staph aureus and pseudomonas in wound culture from dermatologist office.  Pharmacy has been consulted for cefepime dosing added on to doxycycline. Afebrile, WBC wnl Cr 2 crcl < 95ml/min  Plan: Cefepime 2gm IV q12hr  Doxycycline 100mg  q12h  Weight: 94 kg (207 lb 4.8 oz)  Temp (24hrs), Avg:98.1 F (36.7 C), Min:98 F (36.7 C), Max:98.2 F (36.8 C)  Recent Labs  Lab 02/03/22 1608 02/04/22 0120 02/04/22 1445 02/05/22 0342 02/06/22 0358 02/07/22 0610  WBC 7.3 7.9  --  6.6 7.2  --   CREATININE 1.74* 1.93* 1.96* 1.80* 1.99* 2.04*    Estimated Creatinine Clearance: 28.5 mL/min (A) (by C-G formula based on SCr of 2.04 mg/dL (H)).    Allergies  Allergen Reactions   Atorvastatin     Other reaction(s): myalgia   Crestor [Rosuvastatin]     Other reaction(s): myalgia   Ezetimibe     Other reaction(s): myalgia   Amitriptyline Rash    Antimicrobials this admission:   Dose adjustments this admission:   Microbiology results: 2/24 Wound Cx staph and pseudmonas   Bonnita Nasuti Pharm.D. CPP, BCPS Clinical Pharmacist [phone] 02/07/2022 6:49 PM

## 2022-02-07 NOTE — Progress Notes (Addendum)
Advanced Heart Failure Rounding Note  PCP-Cardiologist: Sinclair Grooms, MD   Subjective:    RHC w/ high filling pressures. RA 16. PCWP 23. 1.5L in UOP yesterday w/ IV Lasix. Daily wts unreliable.   Scr continues to trend up, 1.80>>1.99>>2.04. BP stable w/ midodrine.   Orthostatic VS for the past 24 hrs:  BP- Lying BP- Sitting BP- Standing at 0 minutes  02/06/22 1245 103/68 95/61 93/56    Less dizziness w/ standing. Breathing improved.    F/u for amyloid pending.    [place] 2/23:  RA mean 16 PA 55/27 PCWP mean 23 CI 3.7 PVR 2 WU  Objective:   Weight Range: 94 kg Body mass index is 32.47 kg/m.   Vital Signs:   Temp:  [97.8 F (36.6 C)-98.6 F (37 C)] 98 F (36.7 C) (02/24 0411) Pulse Rate:  [78-91] 89 (02/23 1528) Resp:  [14-17] 15 (02/24 0411) BP: (89-118)/(50-80) 103/64 (02/24 0411) SpO2:  [94 %-100 %] 94 % (02/24 0411) Weight:  [94 kg] 94 kg (02/24 0637) Last BM Date : 02/03/22  Weight change: Filed Weights   02/05/22 0401 02/06/22 0449 02/07/22 0637  Weight: 94.3 kg 91.5 kg 94 kg    Intake/Output:   Intake/Output Summary (Last 24 hours) at 02/07/2022 0734 Last data filed at 02/06/2022 2340 Gross per 24 hour  Intake 840 ml  Output 1450 ml  Net -610 ml      Physical Exam    General:  elderly WF, moderately obese. No resp difficulty HEENT: Normal Neck: Supple. JVP 12 cm . Carotids 2+ bilat; no bruits. No lymphadenopathy or thyromegaly appreciated. Cor: PMI nondisplaced. Regular rate & rhythm. No rubs, gallops or murmurs. Lungs: decreased BS at the bases b/l  Abdomen: + abdominal binder, Soft, nontender, nondistended. No hepatosplenomegaly. No bruits or masses. Good bowel sounds. Extremities: No cyanosis, clubbing, rash, 1+ b/l LE edema +TED hoses, rt arm in cast  Neuro: Alert & orientedx3, cranial nerves grossly intact. moves all 4 extremities w/o difficulty. Affect pleasant   Telemetry   NSR 80s, personally reviewed   EKG    No new EKG  to review   Labs    CBC Recent Labs    02/05/22 0342 02/06/22 0358 02/06/22 1040  WBC 6.6 7.2  --   HGB 8.8* 9.1* 9.2*   9.5*  HCT 27.9* 27.9* 27.0*   28.0*  MCV 100.0 99.3  --   PLT 284 272  --    Basic Metabolic Panel Recent Labs    02/06/22 0358 02/06/22 1040 02/07/22 0610  NA 136 138   139 138  K 3.9 4.0   4.0 4.8  CL 106  --  108  CO2 20*  --  21*  GLUCOSE 95  --  91  BUN 31*  --  34*  CREATININE 1.99*  --  2.04*  CALCIUM 8.7*  --  8.9   Liver Function Tests No results for input(s): AST, ALT, ALKPHOS, BILITOT, PROT, ALBUMIN in the last 72 hours. No results for input(s): LIPASE, AMYLASE in the last 72 hours. Cardiac Enzymes No results for input(s): CKTOTAL, CKMB, CKMBINDEX, TROPONINI in the last 72 hours.  BNP: BNP (last 3 results) Recent Labs    02/03/22 1544  BNP 841.9*    ProBNP (last 3 results) No results for input(s): PROBNP in the last 8760 hours.   D-Dimer No results for input(s): DDIMER in the last 72 hours. Hemoglobin A1C No results for input(s): HGBA1C in the last 72  hours. Fasting Lipid Panel Recent Labs    02/06/22 0358  CHOL 188  HDL 51  LDLCALC 104*  TRIG 166*  CHOLHDL 3.7   Thyroid Function Tests No results for input(s): TSH, T4TOTAL, T3FREE, THYROIDAB in the last 72 hours.  Invalid input(s): FREET3  Other results:   Imaging    CARDIAC CATHETERIZATION  Result Date: 02/06/2022   Hemodynamic findings consistent with moderate pulmonary hypertension. 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 Pulmonary vascular resistance 1.99 Woods units. Mean pulmonary artery pressure 38 mmHg; mean right atrial pressure 16 mmHg; mean pulmonary wedge pressure 23 mmHg. Mixed venous O2 saturation 67%; arterial oxygen saturation 96%. Fick cardiac output 7.5 L/min with index 3.71. Pulmonary artery pulsatility index 1.75. RECOMMENDATIONS: Findings suggest diastolic left ventricular  dysfunction as a component of the patient's heart failure.  Other risk factors for pulmonary hypertension include prior PE, morbid obesity, and sleep apnea. Consider adding SGLT2 therapy.    Medications:     Scheduled Medications:  amphetamine-dextroamphetamine  10 mg Oral Q breakfast   aspirin  81 mg Oral QHS   baclofen  20 mg Oral QHS   calcium carbonate  1 tablet Oral Q breakfast   chlorhexidine  15 mL Mouth Rinse BID   doxycycline  100 mg Oral Q12H   DULoxetine  60 mg Oral BID   furosemide  80 mg Intravenous BID   gabapentin  300 mg Oral TID   heparin  5,000 Units Subcutaneous Q8H   HYDROcodone-acetaminophen  1 tablet Oral QID   mouth rinse  15 mL Mouth Rinse q12n4p   midodrine  10 mg Oral TID WC   pantoprazole  40 mg Oral BID   polyethylene glycol  17 g Oral Daily   rOPINIRole  1 mg Oral Daily   And   rOPINIRole  2 mg Oral BID   sodium chloride flush  3 mL Intravenous Q12H   triamcinolone ointment  1 application Topical BID   [START ON 02/09/2022] Vitamin D (Ergocalciferol)  50,000 Units Oral Q7 days    Infusions:  sodium chloride      PRN Medications: sodium chloride, acetaminophen, albuterol, lidocaine (PF), ondansetron (ZOFRAN) IV, ondansetron, sodium chloride flush    Patient Profile   76 y/o Female w/ chronic diastolic heart failure, h/o orthostatic hypotension with multiple falls, exertional dyspnea and severe peripheral neuropathy of uncertain etiology, admitted for acute on chronic diastolic heart failure and AKI. RHC showed elevated filling pressures, normal CO. Being evaluated for amyloid.   Assessment/Plan   1. Acute on chronic diastolic CHF: I reviewed echo from this admission, shows EF 55-60%, mild LVH, grade II diastolic dysfunction, normal RV size and systolic function, mild aortic stenosis with mild-moderate aortic insufficiency, mild-moderate MR. No significant CAD on remote cath but had anomalous RCA.  Cardiolite this admission showed EF 50%, no  ischemia/infarction.  Patient is volume overloaded on exam and by RHC but has had rise in creatinine from baseline 1.7 to 1.99 with attempts at diuresis (also in setting of contrast load and Bactrim use).  She has severe peripheral neuropathy of uncertain etiology, orthostatic hypotension likely from autonomic neuropathy, and bilateral carpal tunnel syndrome.  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  However, her echo is not very impressive for amyloidosis (minimal LVH).  - diuresing slowly, remains fluid overloaded, c/b worsening AKI. 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  - continue IV Lasix 80 mg bid. May need metolazone  to argument diuresis  - continue midodrine for BP support  - Workup for cardiac amyloidosis => she needs PYP scan, cardiac MRI, and myeloma panel + urine immunofixation. W/u pending  2. AKI on CKD stage 3: Prior creatinine 1.4.  At admission, she was 1.74. Scr progressively rising, 1.80>>1.99>>2.04 today. Normal CO on RHC. Contrast nephropathy may play a role (had PE CTA at admission).  Also consider Bactrim.  - Avoid contrast.  - Bactrim now discontinued, will use doxycycline instead.  - avoid hypotension. Continue midodrine to support BP  3. Skin: Concern skin infection, primarily lower legs.  ?community-acquired MRSA.  She was on Bactrim, but discontinued w/ bump in SCR.  - continue tx w/ doxycycline.  4. Orthostatic hypotension: In setting of volume overload.  Suspect autonomic neuropathy. Improving.  - Wear compression stockings.  - Wear abdominal binder.  - Continue midodrine 10 mg tid.  5. H/o CVA: Cryptogenic.  6. Aortic valve disorder: Mild AS, mild-moderate AI on 2/23 echo.  7. Elevated troponin: Mild elevation with no trend.  No chest pain currently.  Cardiolite with no ischemia.  Suspect demand ischemia from volume overload.    Length of Stay: [address], [place]  02/07/2022, 7:34 AM  Advanced Heart Failure Team Pager [phone]  (M-F; 7a - 5p)  Please contact [facility] for night-coverage after hours (5p -7a ) and weekends on [email or web address]  Patient seen with PA, agree with the above note.   She is short of breath this morning.  Some diuresis yesterday but not marked.  Creatinine fairly stable 1.99 => 2.04.   BP improved and orthostasis resolved with compression stockings, abdominal binder, and midodrine.   General: NAD Neck: JVP 14 cm, no thyromegaly or thyroid nodule.  Lungs: Clear to auscultation bilaterally with normal respiratory effort. CV: Nondisplaced PMI.  Heart regular S1/S2, no S3/S4, 3/6 SEM RUSB.  Trace ankle edema.  Abdomen: Soft, nontender, no hepatosplenomegaly, no distention.  Skin: Intact without lesions or rashes.  Neurologic: Alert and oriented x 3.  Psych: Normal affect. Extremities: No clubbing or cyanosis.  HEENT: Normal.   Orthostatic hypotension (suspect autonomic neuropathy) improved.  - Continue current midodrine.  - Continue compression stockings and abdominal binder.   Acute on chronic diastolic CHF.  She remains volume overloaded with elevated JVP and dyspnea. Creatinine fairly stable. She is going to have to get more fluid off.  - Will continue Lasix 80 mg IV bid but give a dose of metolazone 2.5 x 1 today.   Continue workup for cardiac amyloidosis.  She has peripheral neuropathy, orthostatic hypotension, h/o bilateral carpal tunnel syndrome so highly suggestive.  However, echo without impressive LVH.  - Cardiac MRI done last night, will review.  - Needs PYP scan today.  - Myeloma workup pending.   Loralie Champagne 02/07/2022 9:35 AM  PYP scan was not suggestive of TTR cardiac amyloidosis.  cMRI was somewhat equivocal => LV walls not thickened and there was delayed enhancement in the basal inferior wall in a subepicardial pattern more concerning for myocarditis than amyloidosis.  However, the extracellular volume percentage was 49%, which is suggestive of amyloidosis. At this  point, will need to wait for myeloma labs (?AL amyloidosis).   Culture from leg lesions from outside dermatologist was obtained, positive for both S aureus and Pseudomonas.  Both are sensitive to ciprofloxacin.  Will continue doxycycline and add cefepime.  Will need ID consult in am.   Loralie Champagne 02/07/2022 5:56 PM

## 2022-02-07 NOTE — Progress Notes (Signed)
Mobility Specialist Progress Note:   02/07/22 1200  Mobility  Activity Ambulated with assistance in room;Transferred from bed to chair  Level of Assistance Minimal assist, patient does 75% or more  Assistive Device Front wheel walker  Distance Ambulated (ft) 10 ft  Activity Response Tolerated well  $Mobility charge 1 Mobility   Pt agreeable to get in chair to eat lunch. Required minA to stand from EOB, CGA during gait. Pt c/o being "too dizzy to eat". Encouraged pt to eat, pt voiced understanding.   Nelta Numbers [facility] Phone: [phone] Office Phone: [phone]

## 2022-02-08 DIAGNOSIS — G4733 Obstructive sleep apnea (adult) (pediatric): Secondary | ICD-10-CM | POA: Diagnosis not present

## 2022-02-08 DIAGNOSIS — I10 Essential (primary) hypertension: Secondary | ICD-10-CM | POA: Diagnosis not present

## 2022-02-08 DIAGNOSIS — R0609 Other forms of dyspnea: Secondary | ICD-10-CM | POA: Diagnosis not present

## 2022-02-08 DIAGNOSIS — I2729 Other secondary pulmonary hypertension: Secondary | ICD-10-CM | POA: Diagnosis not present

## 2022-02-08 LAB — BASIC METABOLIC PANEL
Anion gap: 15 (ref 5–15)
BUN: 38 mg/dL — ABNORMAL HIGH (ref 8–23)
CO2: 19 mmol/L — ABNORMAL LOW (ref 22–32)
Calcium: 8.9 mg/dL (ref 8.9–10.3)
Chloride: 103 mmol/L (ref 98–111)
Creatinine, Ser: 2.19 mg/dL — ABNORMAL HIGH (ref 0.44–1.00)
GFR, Estimated: 23 mL/min — ABNORMAL LOW (ref >60)
Glucose, Bld: 102 mg/dL — ABNORMAL HIGH (ref 70–99)
Potassium: 4.8 mmol/L (ref 3.5–5.1)
Sodium: 137 mmol/L (ref 135–145)
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
## 2022-02-09 ENCOUNTER — Inpatient Hospital Stay (HOSPITAL_COMMUNITY): Payer: Medicare Other

## 2022-02-09 DIAGNOSIS — R1012 Left upper quadrant pain: Secondary | ICD-10-CM

## 2022-02-09 DIAGNOSIS — I10 Essential (primary) hypertension: Secondary | ICD-10-CM | POA: Diagnosis not present

## 2022-02-09 DIAGNOSIS — I5033 Acute on chronic diastolic (congestive) heart failure: Secondary | ICD-10-CM | POA: Diagnosis not present

## 2022-02-09 DIAGNOSIS — R112 Nausea with vomiting, unspecified: Secondary | ICD-10-CM

## 2022-02-09 DIAGNOSIS — G4733 Obstructive sleep apnea (adult) (pediatric): Secondary | ICD-10-CM | POA: Diagnosis not present

## 2022-02-09 LAB — CBC WITH DIFFERENTIAL/PLATELET
Abs Immature Granulocytes: 0.03 10*3/uL (ref 0.00–0.07)
Basophils Absolute: 0.1 10*3/uL (ref 0.0–0.1)
Basophils Relative: 1 %
Eosinophils Absolute: 0.4 10*3/uL (ref 0.0–0.5)
Eosinophils Relative: 6 %
HCT: 31.7 % — ABNORMAL LOW (ref 36.0–46.0)
Hemoglobin: 10.4 g/dL — ABNORMAL LOW (ref 12.0–15.0)
Immature Granulocytes: 1 %
Lymphocytes Relative: 19 %
Lymphs Abs: 1.2 10*3/uL (ref 0.7–4.0)
MCH: 33.2 pg (ref 26.0–34.0)
MCHC: 32.8 g/dL (ref 30.0–36.0)
MCV: 101.3 fL — ABNORMAL HIGH (ref 80.0–100.0)
Monocytes Absolute: 0.5 10*3/uL (ref 0.1–1.0)
Monocytes Relative: 8 %
Neutro Abs: 4.2 10*3/uL (ref 1.7–7.7)
Neutrophils Relative %: 65 %
Platelets: 303 10*3/uL (ref 150–400)
RBC: 3.13 MIL/uL — ABNORMAL LOW (ref 3.87–5.11)
RDW: 15.4 % (ref 11.5–15.5)
WBC: 6.4 10*3/uL (ref 4.0–10.5)
nRBC: 0 % (ref 0.0–0.2)

## 2022-02-09 LAB — PROCALCITONIN: Procalcitonin: <0.1 ng/mL

## 2022-02-09 LAB — BASIC METABOLIC PANEL
Anion gap: 14 (ref 5–15)
BUN: 41 mg/dL — ABNORMAL HIGH (ref 8–23)
CO2: 25 mmol/L (ref 22–32)
Calcium: 8.9 mg/dL (ref 8.9–10.3)
Chloride: 101 mmol/L (ref 98–111)
Creatinine, Ser: 2.12 mg/dL — ABNORMAL HIGH (ref 0.44–1.00)
GFR, Estimated: 24 mL/min — ABNORMAL LOW (ref >60)
Glucose, Bld: 93 mg/dL (ref 70–99)
Potassium: 4.3 mmol/L (ref 3.5–5.1)
Sodium: 140 mmol/L (ref 135–145)

## 2022-02-09 LAB — AMYLASE: Amylase: 18 U/L — ABNORMAL LOW (ref 28–100)

## 2022-02-09 LAB — HEPATIC FUNCTION PANEL
ALT: 16 U/L (ref 0–44)
AST: 13 U/L — ABNORMAL LOW (ref 15–41)
Albumin: 2.7 g/dL — ABNORMAL LOW (ref 3.5–5.0)
Alkaline Phosphatase: 99 U/L (ref 38–126)
Bilirubin, Direct: 0.1 mg/dL (ref 0.0–0.2)
Indirect Bilirubin: 0.6 mg/dL (ref 0.3–0.9)
Total Bilirubin: 0.7 mg/dL (ref 0.3–1.2)
Total Protein: 6.7 g/dL (ref 6.5–8.1)

## 2022-02-09 LAB — LACTIC ACID, PLASMA: Lactic Acid, Venous: 0.9 mmol/L (ref 0.5–1.9)

## 2022-02-09 LAB — LIPASE, BLOOD: Lipase: 26 U/L (ref 11–51)

## 2022-02-09 LAB — TROPONIN I (HIGH SENSITIVITY)
Troponin I (High Sensitivity): 120 ng/L (ref <18)
Troponin I (High Sensitivity): 136 ng/L (ref <18)

## 2022-02-09 IMAGING — CR DG ABDOMEN 1V
2 series · 2 of 2 positions shown · non-contrast
Comparison: None.

CLINICAL DATA: Diffuse abdominal pain and constipation for 1 week.

EXAM:
ABDOMEN - 1 VIEW

[abdomen kub (1 of 2)]
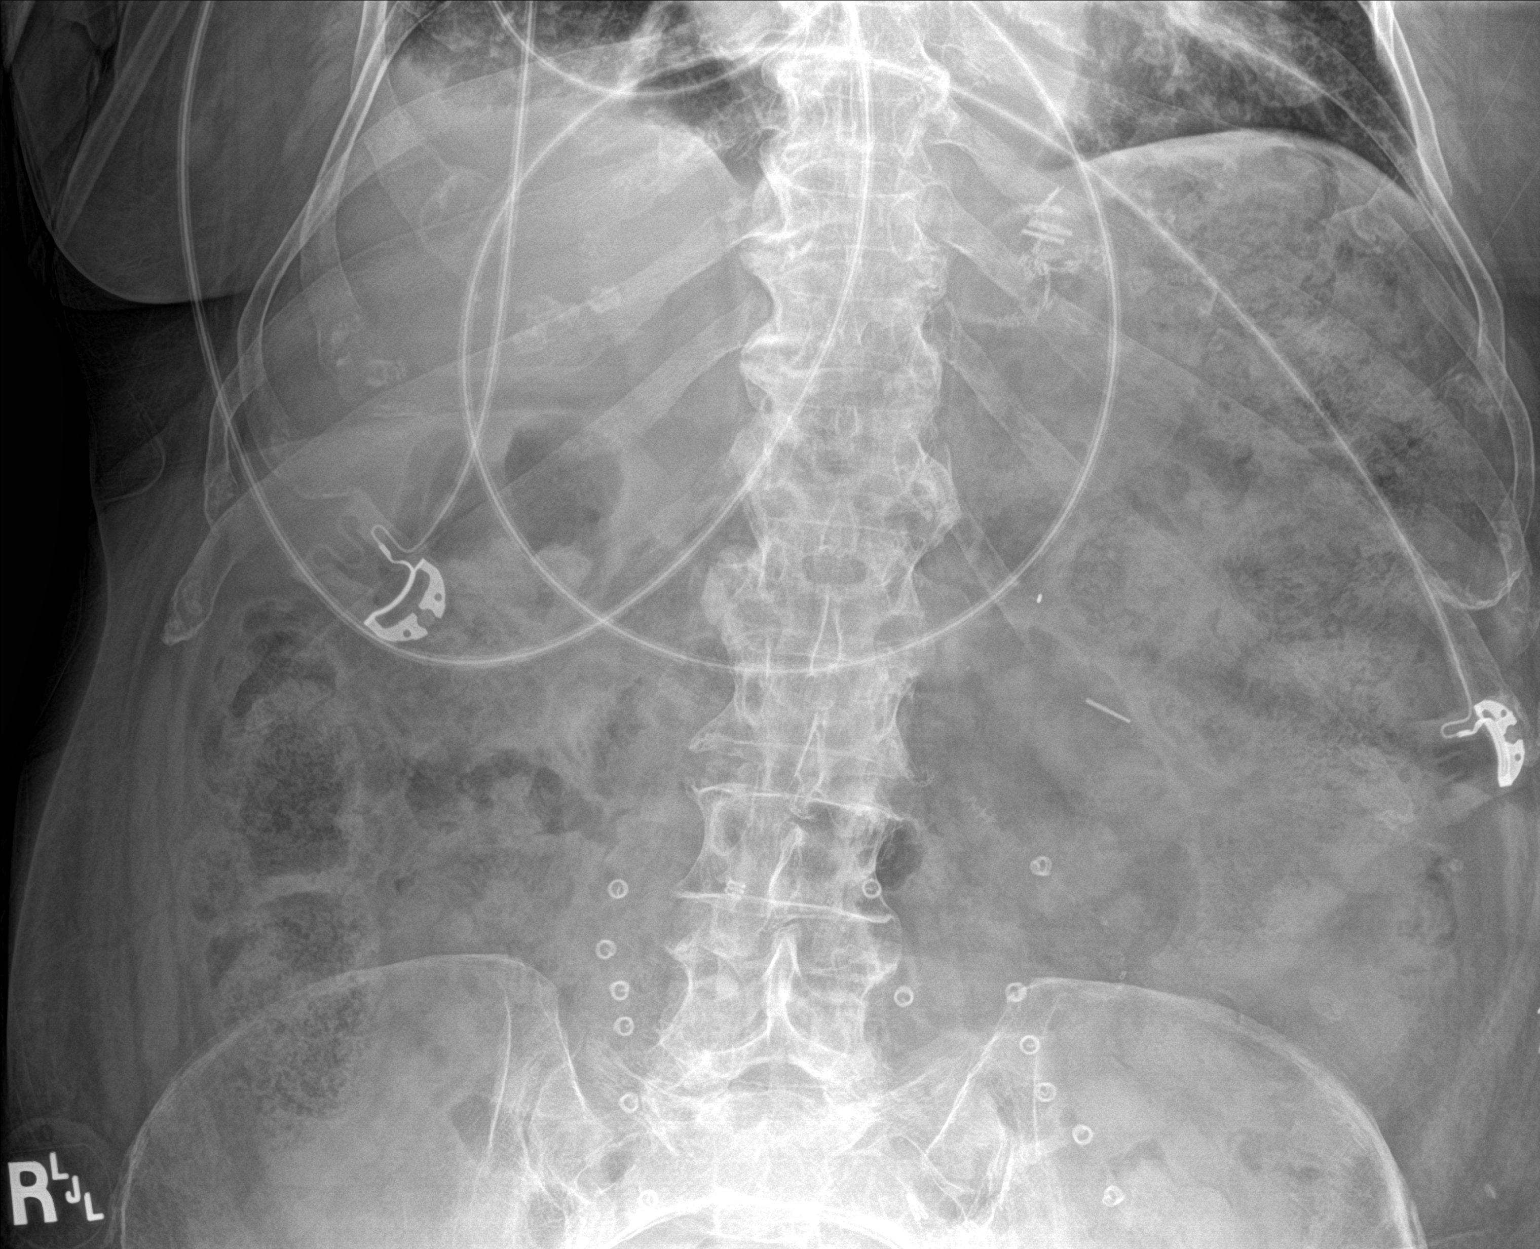

[abdomen kub (2 of 2)]
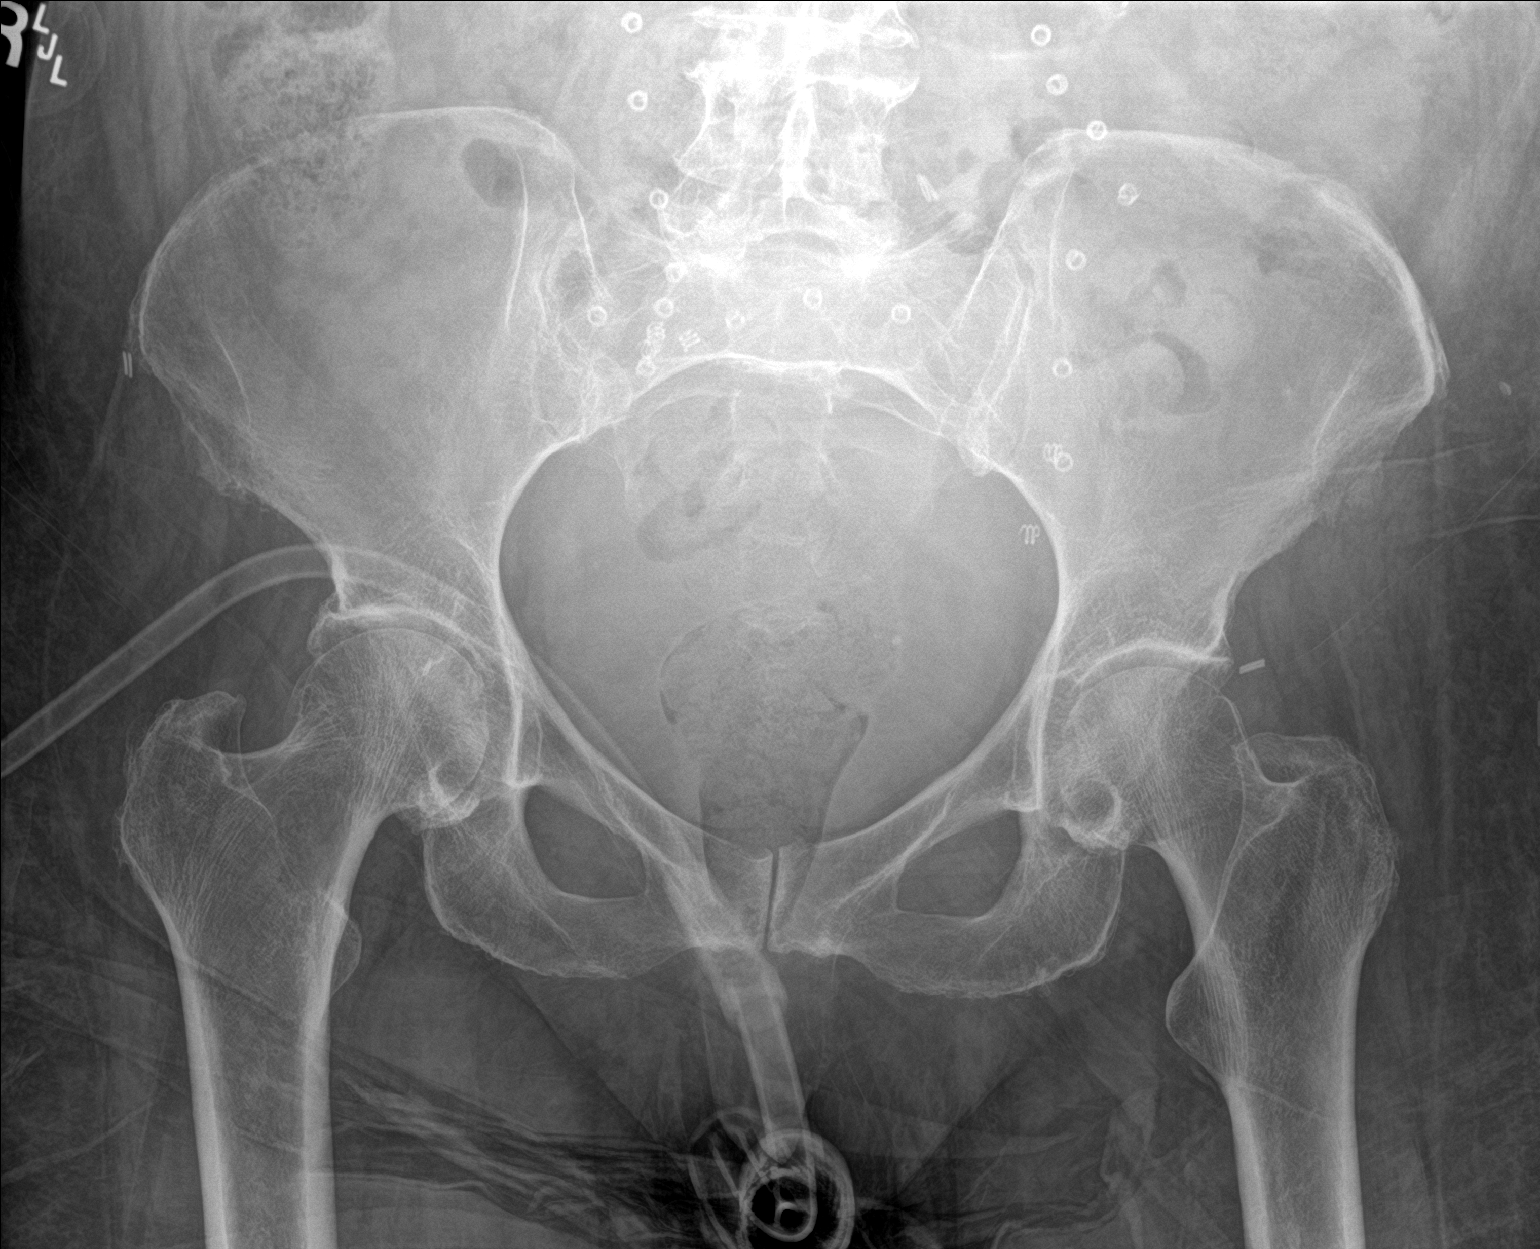

[2 of 2 positions shown; findings below may reference images not displayed]

FINDINGS: No evidence of dilated bowel loops. Moderate to large stool burden
noted. Abdominal wall surgical mesh noted. Surgical clips also seen
in the epigastric region and left abdomen.
IMPRESSION: No acute findings. Moderate to large stool burden noted.

## 2022-02-09 IMAGING — US US ABDOMEN LIMITED
1 series · 14 of 25 positions shown · non-contrast
Comparison: None.

CLINICAL DATA: Abdominal pain

EXAM:
ULTRASOUND ABDOMEN LIMITED RIGHT UPPER QUADRANT

[Series 1: us abdomen limited ruq (liver/gb) · 14 of 71 slices shown]
[im 1/71]
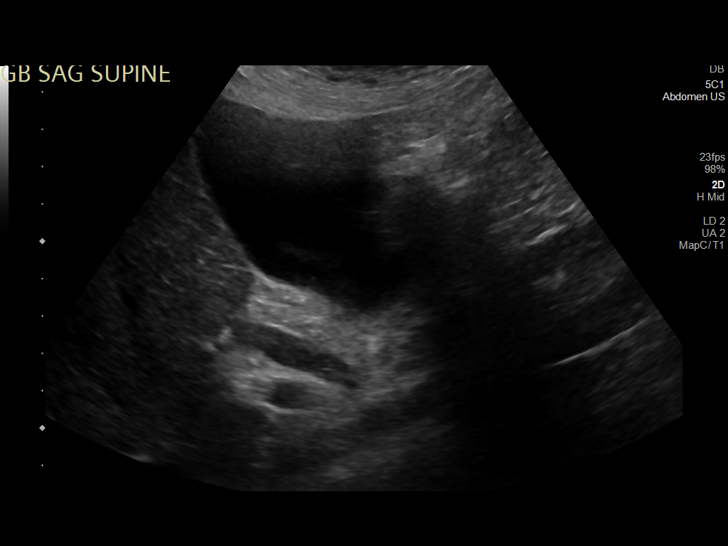
[im 6/71]
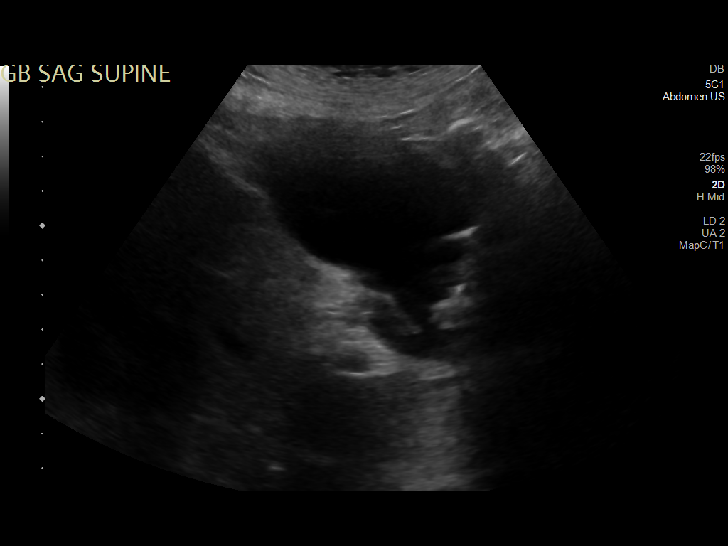
[im 12/71]
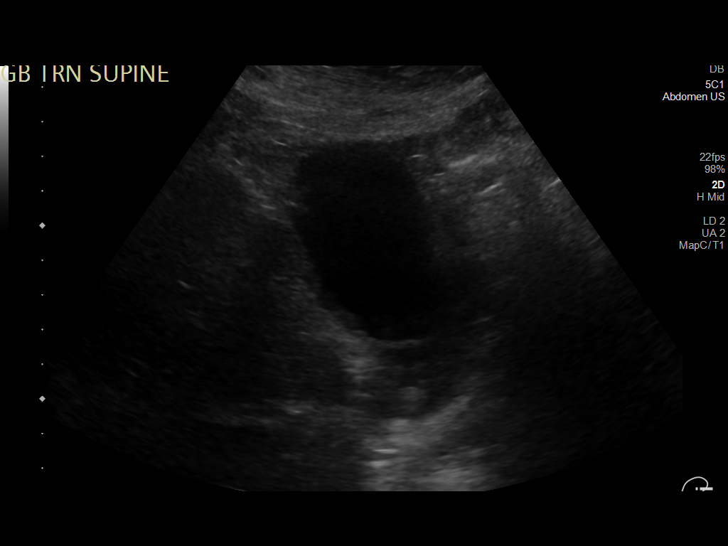
[im 18/71]
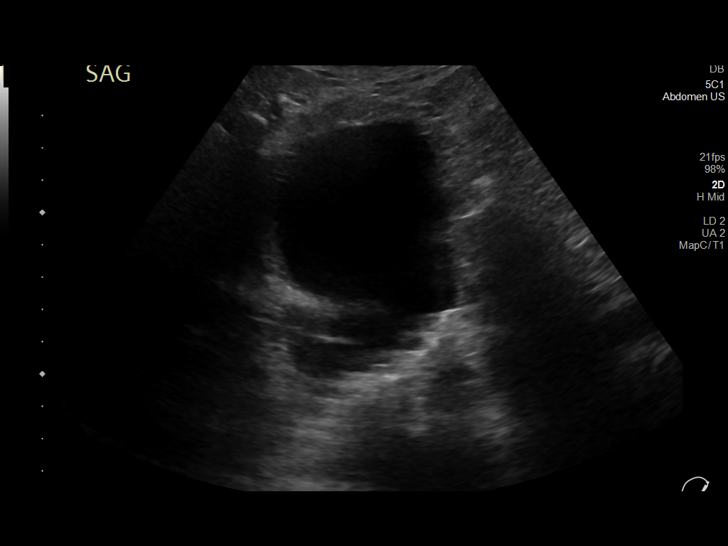
[im 24/71]
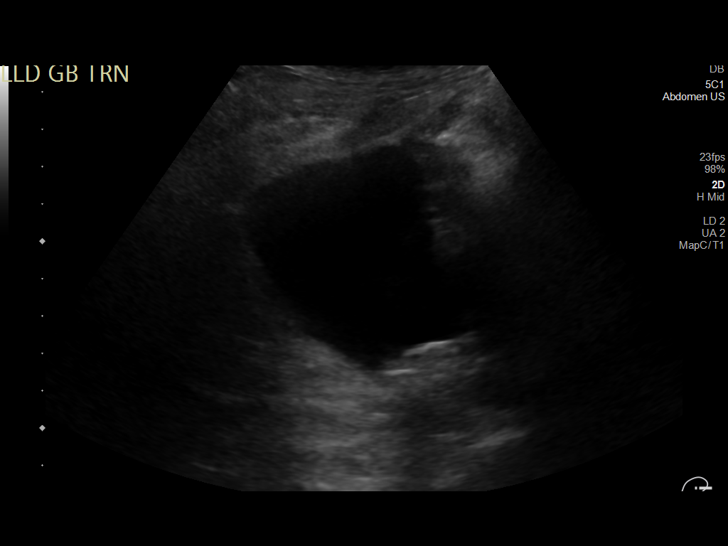
[im 27/71]
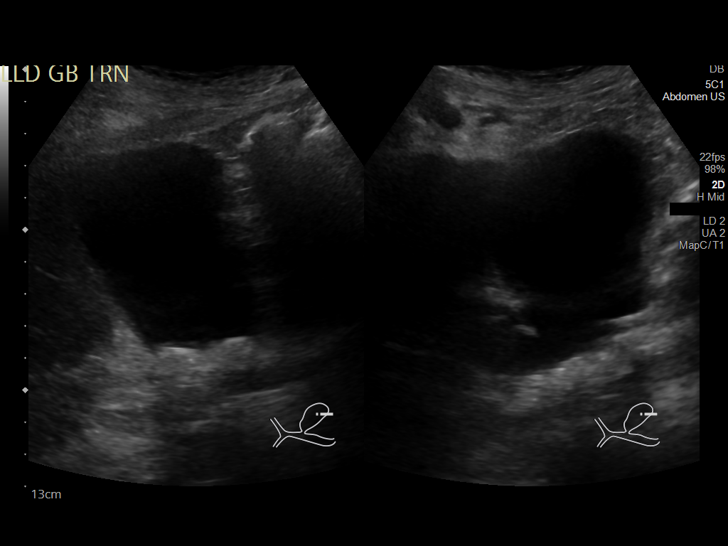
[im 33/71]
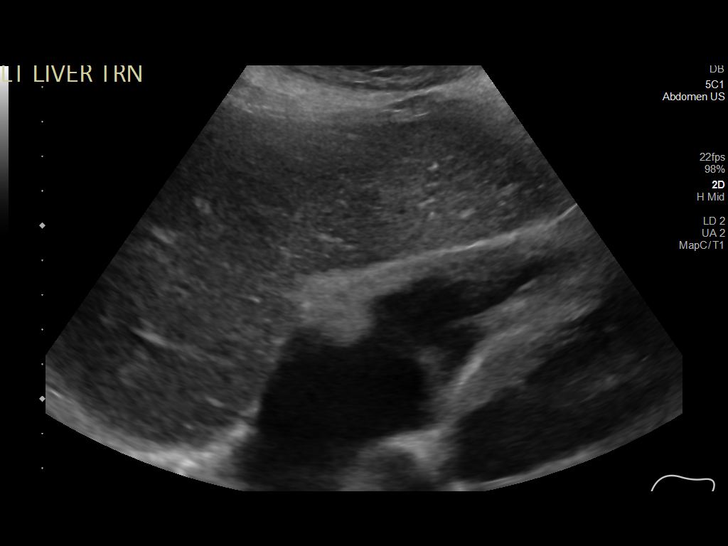
[im 38/71]
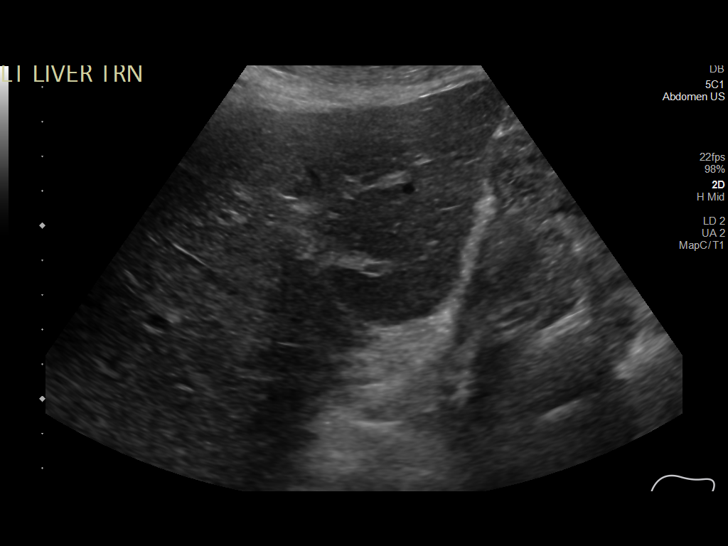
[im 44/71]
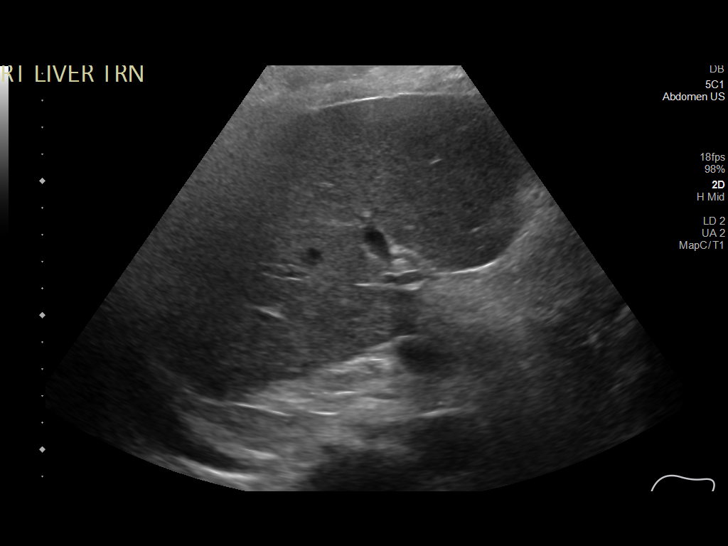
[im 47/71]
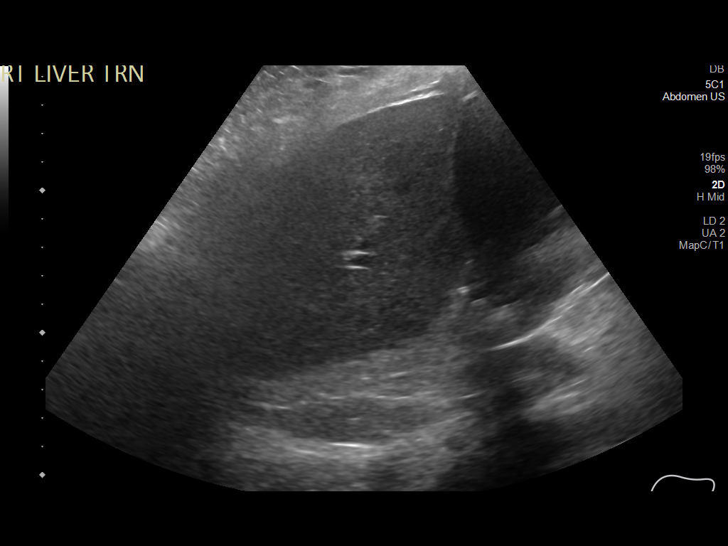
[im 53/71]
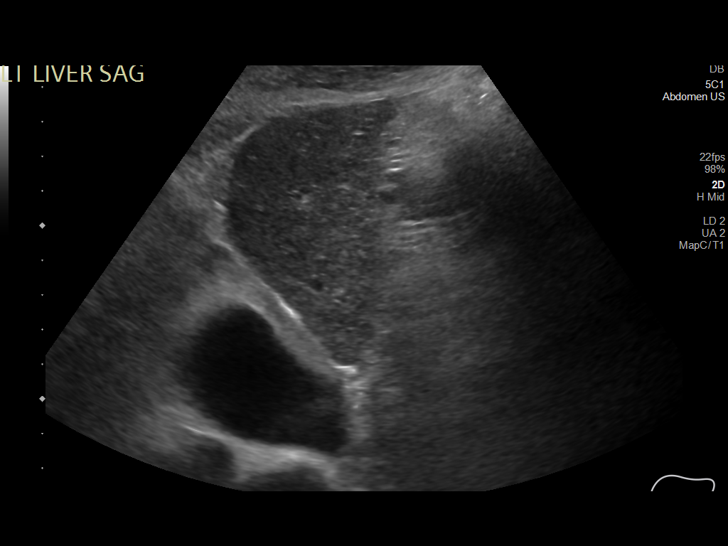
[im 59/71]
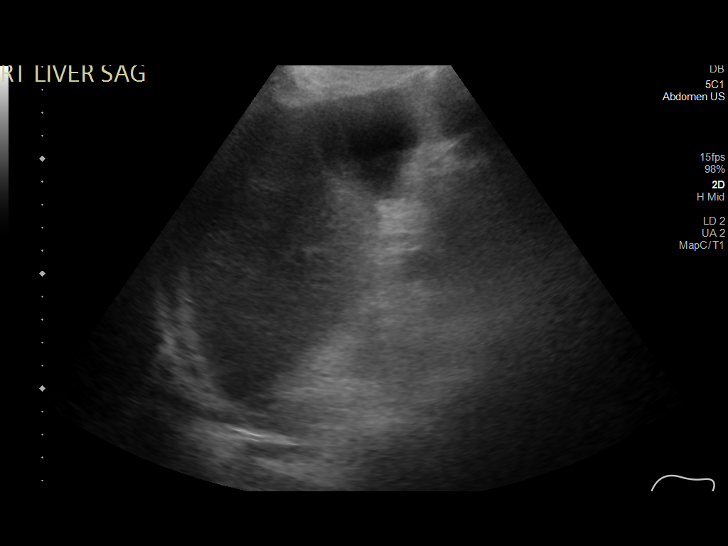
[im 65/71]
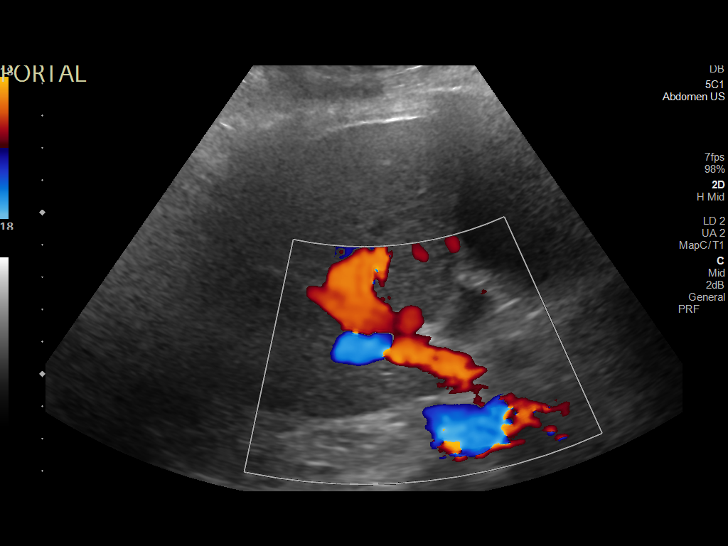
[im 71/71]
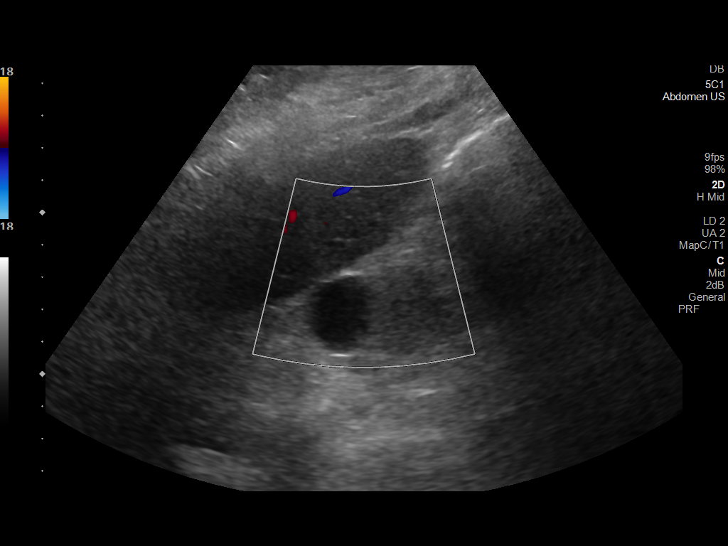

[14 of 25 positions shown; findings below may reference images not displayed]

FINDINGS: Gallbladder:

Gallbladder stones are seen. There is no significant wall
thickening. Technologist observed tenderness over the gallbladder
during the study. There is no fluid around the gallbladder.

Common bile duct:

Diameter: 6.4 mm

Liver:

There is inhomogeneous echogenicity in the visualized portions of
liver. No focal abnormality is seen in the visualized portions of
liver. Portal vein is patent on color Doppler imaging with normal
direction of blood flow towards the liver.

Other: Incidental note is made of 2.2 cm cyst in the upper pole of
right kidney.
IMPRESSION: Gallbladder stones. Technologist observed tenderness over the region
of the gallbladder. There are no other imaging signs of acute
cholecystitis. Please correlate with clinical symptoms and
laboratory findings.

There is heterogeneous echogenicity in the liver without focal
abnormalities.

2.2 cm cyst is seen in the right kidney.

## 2022-02-09 MED ORDER — SODIUM CHLORIDE 0.9 % IV SOLN
2.0000 g | INTRAVENOUS | Status: DC
  Administered 2022-02-10: 2 g via INTRAVENOUS
  Filled 2022-02-09: qty 2
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
## 2022-02-09 NOTE — Progress Notes (Signed)
Mobility Specialist Criteria Algorithm Info. ° ° 02/09/22 0945  °Mobility  °Activity Transferred to/from BSC  °Range of Motion/Exercises Active;All extremities  °Level of Assistance Minimal assist, patient does 75% or more  °Assistive Device Front wheel walker;BSC  °Distance Ambulated (ft) 2 ft  °Activity Response Tolerated well  ° ° Patient received in bed, requesting assistance to BSC. Required min A sit>stand to and from BSC. Was left in bed with all needs met, call bell in reach. ° °02/09/2022 °09:45 AM ° ° , CMS, BS EXP °Acute Rehabilitation Services  °Phone:[phone] °Office: [phone] ° °

## 2022-02-09 NOTE — Progress Notes (Signed)
KUB shows no air fluid levels or dilated loops of bowel.  There is a large stool burden.  Given her hx of cholelithiasis will order a GB US.

## 2022-02-09 NOTE — Progress Notes (Signed)
Patient very anxious and thinking she has "all these diseases". No distress noted, VS stable. HR tachy due to pain and anxiety. Will monitor.   02/09/22 1951  Assess: MEWS Score  Temp 98.9 F (37.2 C)  BP (!) 122/55  Pulse Rate (!) 111  ECG Heart Rate (!) 111  Resp 16  Level of Consciousness Alert  SpO2 97 %  O2 Device Nasal Cannula  Patient Activity (if Appropriate) In bed  O2 Flow Rate (L/min) 2 L/min  Assess: MEWS Score  MEWS Temp 0  MEWS Systolic 0  MEWS Pulse 2  MEWS RR 0  MEWS LOC 0  MEWS Score 2  MEWS Score Color Yellow  Assess: if the MEWS score is Yellow or Red  Were vital signs taken at a resting state? Yes  Focused Assessment No change from prior assessment  Early Detection of Sepsis Score *See Row Information* Low  MEWS guidelines implemented *See Row Information* No, other (Comment) (Yellow MEWs in the Past Continue with q4 VS)  Take Vital Signs  Increase Vital Sign Frequency   (Yellow mews in the past continue with Q4 VS)  Escalate  MEWS: Escalate  (Yellow mews in the past continue with Q4 VS did not escalate)  Document  Patient Outcome Other (Comment) (stable, extremely anxious, no distress noted)  Progress note created (see row info) Yes
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
## 2022-02-10 ENCOUNTER — Inpatient Hospital Stay (HOSPITAL_COMMUNITY): Payer: Medicare Other

## 2022-02-10 ENCOUNTER — Encounter (HOSPITAL_COMMUNITY): Payer: Self-pay | Admitting: Internal Medicine

## 2022-02-10 DIAGNOSIS — I2729 Other secondary pulmonary hypertension: Secondary | ICD-10-CM | POA: Diagnosis not present

## 2022-02-10 DIAGNOSIS — I480 Paroxysmal atrial fibrillation: Secondary | ICD-10-CM | POA: Diagnosis not present

## 2022-02-10 DIAGNOSIS — L03119 Cellulitis of unspecified part of limb: Secondary | ICD-10-CM

## 2022-02-10 DIAGNOSIS — R42 Dizziness and giddiness: Secondary | ICD-10-CM

## 2022-02-10 DIAGNOSIS — E669 Obesity, unspecified: Secondary | ICD-10-CM | POA: Diagnosis present

## 2022-02-10 DIAGNOSIS — I5033 Acute on chronic diastolic (congestive) heart failure: Secondary | ICD-10-CM | POA: Diagnosis not present

## 2022-02-10 DIAGNOSIS — I10 Essential (primary) hypertension: Secondary | ICD-10-CM | POA: Diagnosis not present

## 2022-02-10 DIAGNOSIS — E782 Mixed hyperlipidemia: Secondary | ICD-10-CM

## 2022-02-10 LAB — PROTEIN ELECTROPHORESIS, SERUM
A/G Ratio: 0.8 (ref 0.7–1.7)
Albumin ELP: 2.7 g/dL — ABNORMAL LOW (ref 2.9–4.4)
Alpha-1-Globulin: 0.4 g/dL (ref 0.0–0.4)
Alpha-2-Globulin: 1 g/dL (ref 0.4–1.0)
Beta Globulin: 1.1 g/dL (ref 0.7–1.3)
Gamma Globulin: 0.7 g/dL (ref 0.4–1.8)
Globulin, Total: 3.2 g/dL (ref 2.2–3.9)
Total Protein ELP: 5.9 g/dL — ABNORMAL LOW (ref 6.0–8.5)

## 2022-02-10 LAB — URINALYSIS, ROUTINE W REFLEX MICROSCOPIC
Bilirubin Urine: NEGATIVE
Glucose, UA: NEGATIVE mg/dL
Ketones, ur: 5 mg/dL — AB
Nitrite: NEGATIVE
Protein, ur: NEGATIVE mg/dL
Specific Gravity, Urine: 1.016 (ref 1.005–1.030)
pH: 5 (ref 5.0–8.0)

## 2022-02-10 LAB — PROCALCITONIN: Procalcitonin: <0.1 ng/mL

## 2022-02-10 LAB — COMPREHENSIVE METABOLIC PANEL
ALT: 15 U/L (ref 0–44)
AST: 15 U/L (ref 15–41)
Albumin: 2.6 g/dL — ABNORMAL LOW (ref 3.5–5.0)
Alkaline Phosphatase: 106 U/L (ref 38–126)
Anion gap: 12 (ref 5–15)
BUN: 45 mg/dL — ABNORMAL HIGH (ref 8–23)
CO2: 29 mmol/L (ref 22–32)
Calcium: 9 mg/dL (ref 8.9–10.3)
Chloride: 100 mmol/L (ref 98–111)
Creatinine, Ser: 2.39 mg/dL — ABNORMAL HIGH (ref 0.44–1.00)
GFR, Estimated: 21 mL/min — ABNORMAL LOW (ref >60)
Glucose, Bld: 92 mg/dL (ref 70–99)
Potassium: 4.2 mmol/L (ref 3.5–5.1)
Sodium: 141 mmol/L (ref 135–145)
Total Bilirubin: 0.4 mg/dL (ref 0.3–1.2)
Total Protein: 6.5 g/dL (ref 6.5–8.1)

## 2022-02-10 LAB — CBC
HCT: 33.3 % — ABNORMAL LOW (ref 36.0–46.0)
Hemoglobin: 10.6 g/dL — ABNORMAL LOW (ref 12.0–15.0)
MCH: 31.5 pg (ref 26.0–34.0)
MCHC: 31.8 g/dL (ref 30.0–36.0)
MCV: 99.1 fL (ref 80.0–100.0)
Platelets: 337 10*3/uL (ref 150–400)
RBC: 3.36 MIL/uL — ABNORMAL LOW (ref 3.87–5.11)
RDW: 15.1 % (ref 11.5–15.5)
WBC: 8.5 10*3/uL (ref 4.0–10.5)
nRBC: 0 % (ref 0.0–0.2)

## 2022-02-10 LAB — AMMONIA: Ammonia: 21 umol/L (ref 9–35)

## 2022-02-10 LAB — LACTIC ACID, PLASMA
Lactic Acid, Venous: 1 mmol/L (ref 0.5–1.9)
Lactic Acid, Venous: 1.3 mmol/L (ref 0.5–1.9)

## 2022-02-10 IMAGING — CT CT ABD-PELV W/O CM
2 of 4 series · 14 of 46 positions shown, 16 images · non-contrast
Comparison: Ultrasound right upper quadrant [DATE]; KUB
[DATE]; CT abdomen and pelvis with contrast [DATE]

CLINICAL DATA: Nausea and vomiting. Abdominal pain. KUB with
moderate-to-large stool burden. Ultrasound abdomen with gallstones
but no evidence of cholecystitis. Heterogeneous echogenicity of
liver.



[Series 3: abd/ pelvis 5.0 i30f 2 · axial · 0.84mm/px · z∈[-526,-136]mm · 11 of 94 slices shown, 13 images]
[im 8/94  soft-tissue]
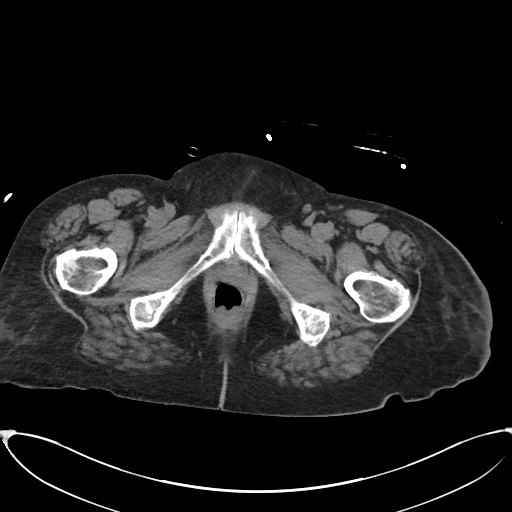
[im 8/94  bone]
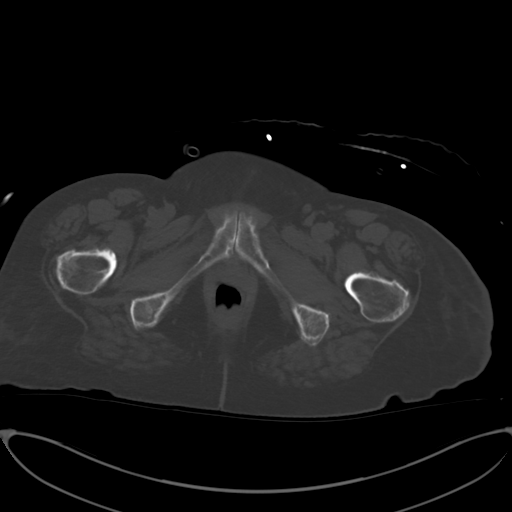
[im 15/94  soft-tissue]
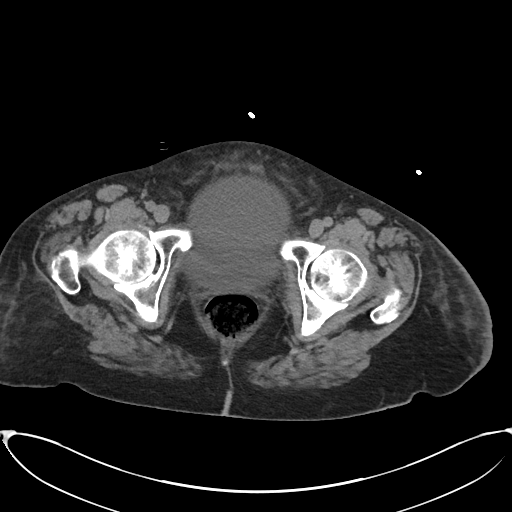
[im 23/94  soft-tissue]
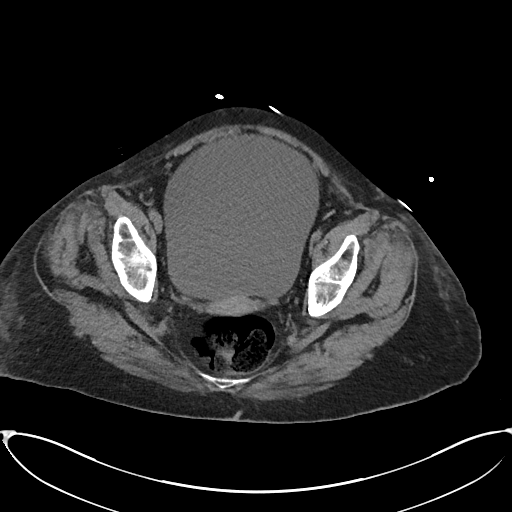
[im 30/94  soft-tissue]
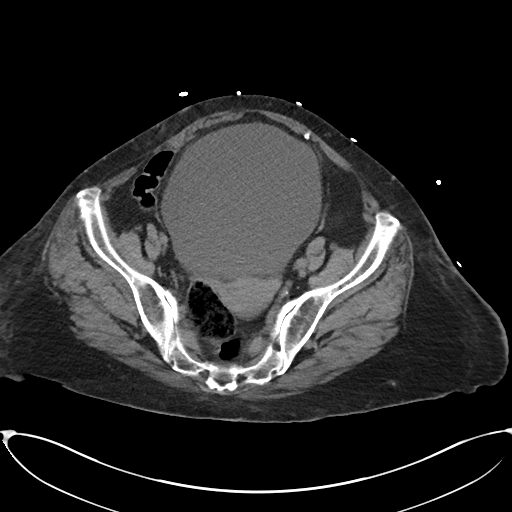
[im 38/94  soft-tissue]
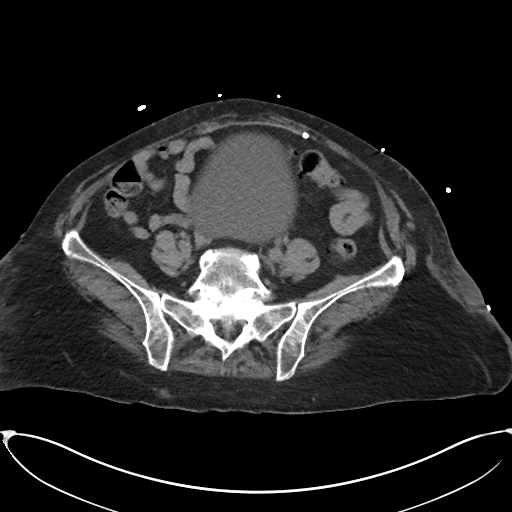
[im 49/94  soft-tissue]
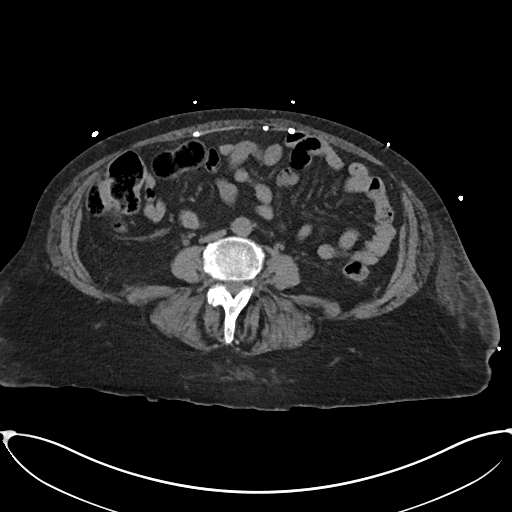
[im 56/94  soft-tissue]
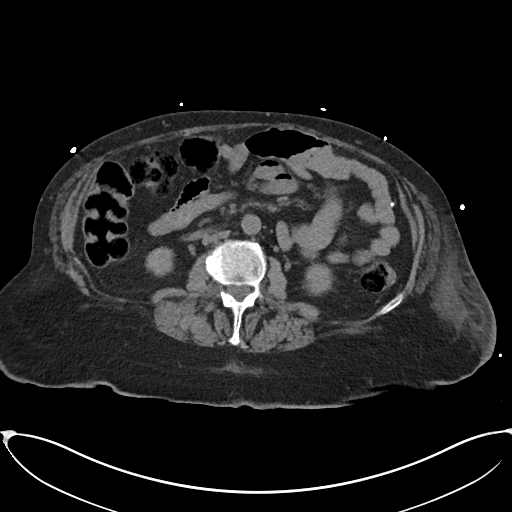
[im 64/94  soft-tissue]
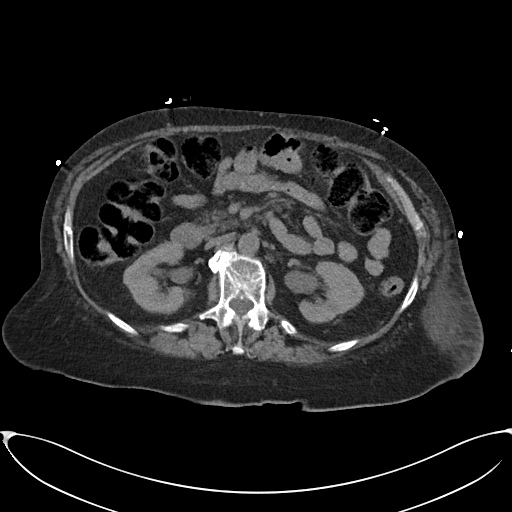
[im 71/94  soft-tissue]
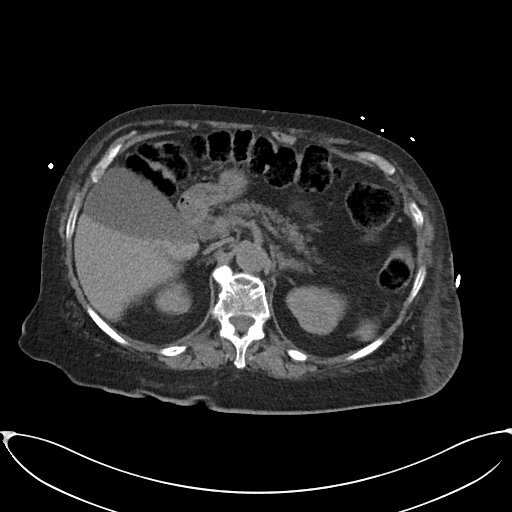
[im 71/94  bone]
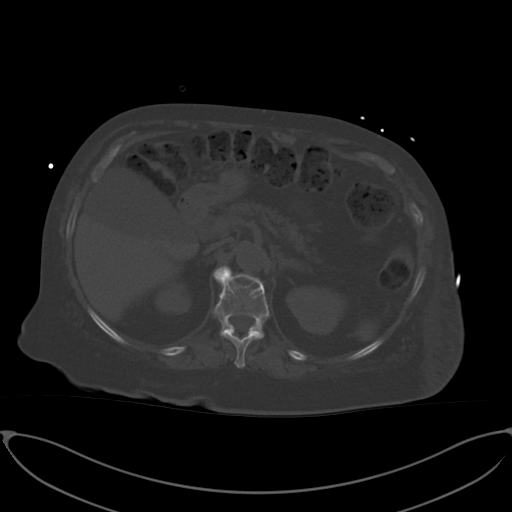
[im 79/94  soft-tissue]
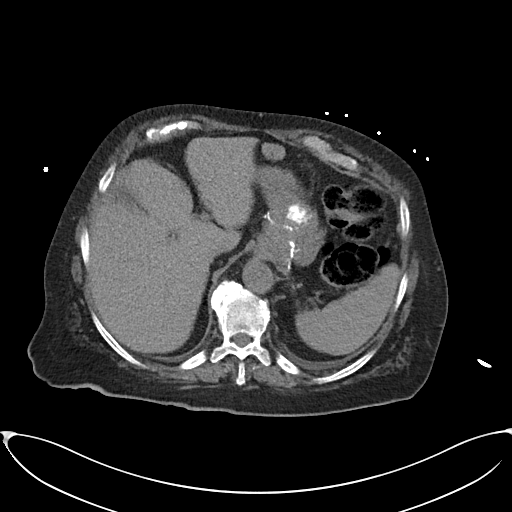
[im 86/94  soft-tissue]
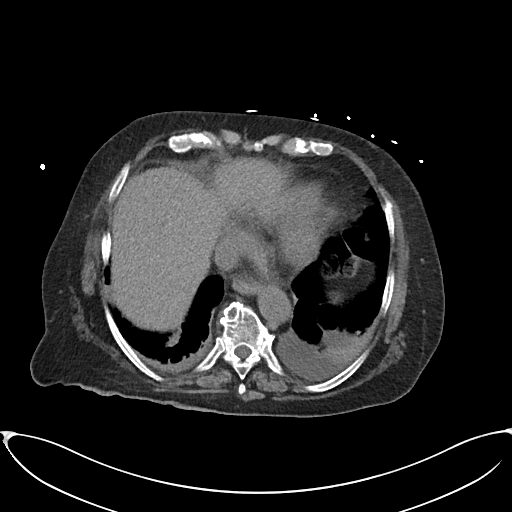

[Series 6: cor st · coronal · 0.83mm/px · 3 of 101 slices shown]
[im 34/101  soft-tissue]
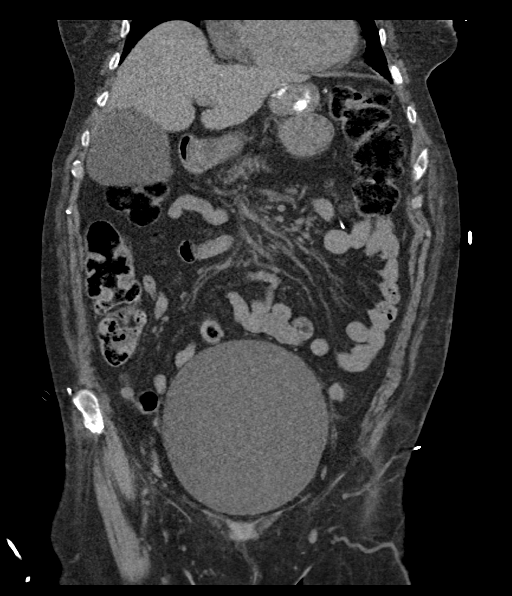
[im 45/101  soft-tissue]
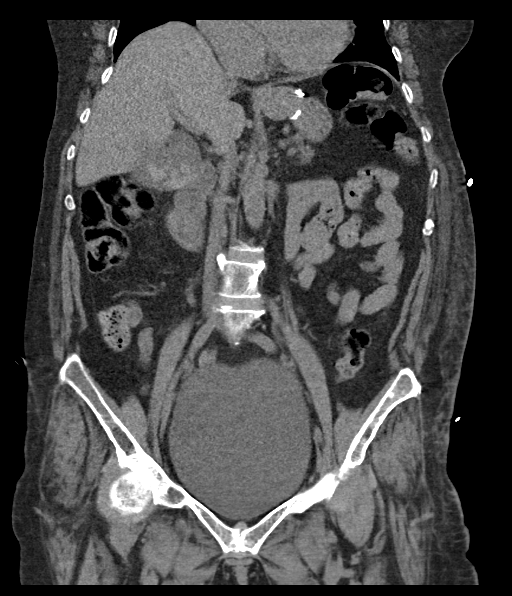
[im 56/101  soft-tissue]
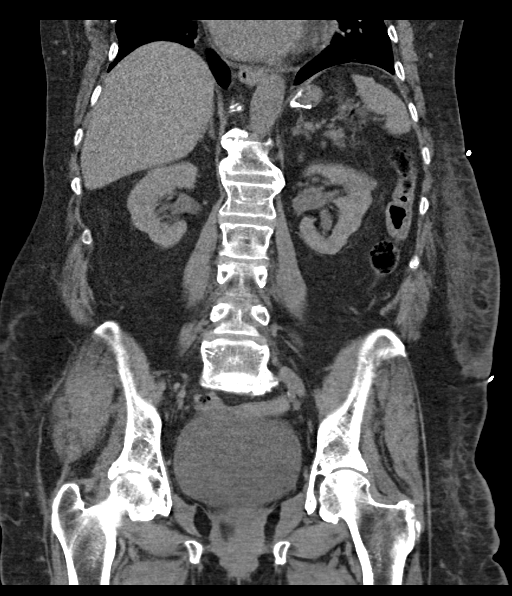

[14 of 46 positions shown; findings below may reference images not displayed]

FINDINGS: Lower chest: Small left-greater-than-right pleural effusions new
from [DATE] prior CT. Associated subsegmental atelectasis. Heart
size is moderately to markedly enlarged.

Lack of intra-articular fluid limits evaluation of the abdominal and
pelvic organ parenchyma. The following findings are made within this
limitation.

Hepatobiliary: Smooth liver contours. No gross liver lesion is seen.
The gallbladder is moderately distended. There are layering
gallstones corresponding to the gallstones seen on recent
ultrasound. No intrahepatic or extrahepatic biliary ductal
dilatation.

Pancreas: Grossly unremarkable. No gross pancreatic ductal
dilatation or surrounding inflammatory changes.

Spleen: There is a 12 mm low-density lesion within the lateral
aspect of the spleen, unchanged from prior. This is nonspecific but
differential considerations include a cyst or hemangioma.

Adrenals/Urinary Tract: Normal adrenals. No renal stone or
hydronephrosis. Lateral right upper pole fluid density 2.3 cm cyst
as seen on recent ultrasound. Left upper pole low-density lesion
measuring up to 12 mm too small to further characterize. This is
grossly unchanged from [DATE]. The urinary bladder is moderately
to markedly distended. No gross urinary bladder wall thickening is
seen. No stone is seen within either ureter. There is mild fullness
of the bilateral renal pelves and ureters likely due to the bladder
distention.

Stomach/Bowel: Mild sigmoid diverticulosis. Stool is seen throughout
all segments of the colon compatible with constipation. The terminal
ileum is unremarkable. There is a tiny thin-walled possible appendix
seen on coronal series 6, image 27 similar to prior. This is favored
to represent the patient's normal appendix possibly mildly fall
against itself but without definite inflammatory change.
Postsurgical changes are again seen of surgical suture within the
stomach and probable gastric bypass. Interval decrease in chronic
mild dilatation at the small bowel anastomosis with internal
air-fluid level. No dilated loops of bowel to indicate bowel
obstruction.

Vascular/Lymphatic: No abdominal aortic aneurysm. Mild
atherosclerotic calcifications. No enlarged abdominal or pelvic
lymph nodes.

Reproductive: The uterus is present and grossly unremarkable. No
adnexal masses.

Other: There again ventral lower abdominal hernia mesh coils. No
abdominal wall hernia. No free air or free fluid.

Musculoskeletal: Chronic bilateral L5 pars defects. 10 mm grade 2
anterolisthesis of L5 on S1 unchanged. Severe L5-S1 disc space
narrowing. Moderate to high-grade anterior bridging osteophytes
throughout the lower thoracic spine to the L1-2 level.
IMPRESSION: :
IMPRESSION: 1. Small left-greater-than-right pleural effusions with associated
atelectasis.
2. Cholelithiasis, as seen on recent ultrasound.
3. Moderate to high-grade urinary bladder distention. This is likely
the cause for mild distention of the bilateral ureters and renal
pelves. No obstructing stone is seen.
4. Stool throughout the colon compatible with constipation. No bowel
obstruction. Postsurgical changes of gastric bypass.

## 2022-02-10 MED ORDER — MECLIZINE HCL 25 MG PO TABS
12.5000 mg | ORAL_TABLET | Freq: Three times a day (TID) | ORAL | Status: DC
  Administered 2022-02-10 – 2022-02-15 (×14): 12.5 mg via ORAL
  Filled 2022-02-10 (×14): qty 1

## 2022-02-10 MED ORDER — SENNA 8.6 MG PO TABS
1.0000 | ORAL_TABLET | Freq: Every day | ORAL | Status: DC
Start: 2022-02-10 — End: 2022-02-15
  Administered 2022-02-10 – 2022-02-15 (×6): 8.6 mg via ORAL
  Filled 2022-02-10 (×6): qty 1

## 2022-02-10 MED ORDER — CYCLOBENZAPRINE HCL 5 MG PO TABS: 5.0000 mg | ORAL_TABLET | Freq: Three times a day (TID) | ORAL | Status: DC | PRN

## 2022-02-10 MED ORDER — ONDANSETRON HCL 4 MG/2ML IJ SOLN
4.0000 mg | INTRAMUSCULAR | Status: DC | PRN
  Administered 2022-02-10 – 2022-02-12 (×2): 4 mg via INTRAVENOUS
  Filled 2022-02-10 (×3): qty 2

## 2022-02-10 MED ORDER — SCOPOLAMINE 1 MG/3DAYS TD PT72
1.0000 | MEDICATED_PATCH | TRANSDERMAL | Status: DC
  Administered 2022-02-10 – 2022-02-13 (×2): 1.5 mg via TRANSDERMAL
  Filled 2022-02-10 (×2): qty 1

## 2022-02-10 MED ORDER — BISACODYL 10 MG RE SUPP
10.0000 mg | Freq: Once | RECTAL | Status: AC
  Administered 2022-02-10: 10 mg via RECTAL
  Filled 2022-02-10: qty 1

## 2022-02-10 MED ORDER — SORBITOL 70 % SOLN
45.0000 mL | Freq: Once | Status: AC
  Administered 2022-02-10: 45 mL via ORAL
  Filled 2022-02-10: qty 60

## 2022-02-10 MED ORDER — SUCRALFATE 1 GM/10ML PO SUSP
1.0000 g | Freq: Three times a day (TID) | ORAL | Status: DC
Start: 2022-02-10 — End: 2022-02-15
  Administered 2022-02-10 – 2022-02-15 (×19): 1 g via ORAL
  Filled 2022-02-10 (×19): qty 10

## 2022-02-10 MED ORDER — GABAPENTIN 100 MG PO CAPS
100.0000 mg | ORAL_CAPSULE | Freq: Three times a day (TID) | ORAL | Status: DC
  Administered 2022-02-11 – 2022-02-13 (×9): 100 mg via ORAL
  Filled 2022-02-10 (×10): qty 1

## 2022-02-10 MED ORDER — POLYETHYLENE GLYCOL 3350 17 G PO PACK
17.0000 g | PACK | Freq: Two times a day (BID) | ORAL | Status: DC
  Administered 2022-02-11 – 2022-02-15 (×8): 17 g via ORAL
  Filled 2022-02-10 (×10): qty 1

## 2022-02-10 NOTE — Consult Note (Signed)
Referring Provider:  [place] Primary Care Physician:  Janice Morning, DO Primary Gastroenterologist:  Dr. Paulita Fujita  Reason for Consultation: Abdominal pain, nausea  HPI: [name] is a 76 y.o. female with past medical history of cryptogenic CVA, history of CHF, history of anomalous origin of RCA, sleep apnea, secondary pulmonary hypertension admitted to the hospital for further management of shortness of breath and CHF.  Started noticing abdominal pain and nausea yesterday.  GI is consulted for further evaluation.  Patient with history of gastric bypass.  Patient was seen in the office recently for evaluation of abdominal pain, weight loss and change in bowel habits.  CT abdomen pelvis with contrast in November 2022 showed no acute changes, anatomy of gastric bypass.  Cholelithiasis.  Ultrasound right upper quadrant yesterday showed gallstones with mild right upper quadrant tenderness by technologist during ultrasound.  CT abdomen pelvis without contrast this [name] showed bilateral pleural effusion, moderate to high-grade urinary bladder distention and constipation.  Blood work this [name] showed stable hemoglobin at 10.6, normal white counts, normal LFTs, elevated creatinine at 2.39, normal lactic acid, normal ammonia level.  Normal lipase yesterday.  Patient seen and examined at bedside.  She is sitting comfortably in the recliner.  She just had a bowel movement and had some improvement in abdominal pain.  Continues to have nausea.  Denies any vomiting.  Denies any blood in the stool or black stool.  According to patient, her nausea is worse with change in posture as well as head movements.  She has used scopolamine patch in the past with improvement.  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   Arthritis    generalized   Asthma    Atrophic vaginitis    Back pain    DDD/stenosis   Carotid stenosis    Carotid US 10/16: Plaque RICA  (7-61%), normal LICA   Depression    takes Cymbalta daily   Diverticulosis    benign   DJD (degenerative joint disease)    Dyslipidemia    Dysrhythmia    Family history of GI bleeding    GERD (gastroesophageal reflux disease)    takes Nexium daily   Gestational diabetes    Pt denies   H/O hiatal hernia    surgery for hernia   Headache(784.0)    takes Imitrex daily as needed and Bisoprolol daily;last migraine was about 2wks ago   History of bronchitis 1 yr ago   History of shingles    Insomnia    takes Ambien nightly   Joint pain    Joint swelling    Leg cramps    takes Flexeril daily as needed   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   Nocturia    Osteoporosis    Peripheral neuropathy    takes Gabapentin daily   Pneumonia 43yrs ago   hx of   PONV (postoperative nausea and vomiting)    Restless leg syndrome    takes Requip daily   RLS (restless legs syndrome) 08/16/2015   Sleep apnea    uses CPAP   Stroke ([place])    Tubular adenoma of colon    Vertigo    Vitamin D deficiency     Past Surgical History:  Procedure Laterality Date   COLONOSCOPY     EP IMPLANTABLE DEVICE N/A 11/19/2016   Procedure: Loop Recorder Insertion;  Surgeon: Deboraha Sprang, MD;  Location: [facility];  Service: Cardiovascular;  Laterality: N/A;   ESOPHAGOGASTRODUODENOSCOPY     excess skin removal     post weight loss   GASTRIC BYPASS  2001   HERNIA REPAIR     umbilical   JOINT REPLACEMENT Right    knee   REVERSE SHOULDER ARTHROPLASTY Right 04/25/2021   Procedure: REVERSE SHOULDER ARTHROPLASTY carpal tunel realase right wrist;  Surgeon: Tania Ade, MD;  Location: [facility];  Service: Orthopedics;  Laterality: Right;   RIGHT HEART CATH N/A 02/06/2022   Procedure: RIGHT HEART CATH;  Surgeon: [name], MD;  Location: [facility];  Service: Cardiovascular;  Laterality: N/A;   skin reduction  2003 2004   stomach and arm   STEROID INJECTION TO SCAR     x 2 in back    TOTAL KNEE  ARTHROPLASTY Left 04/03/2015   Procedure: LEFT TOTAL KNEE ARTHROPLASTY;  Surgeon: Melrose Nakayama, MD;  Location: [facility];  Service: Orthopedics;  Laterality: Left;   WISDOM TOOTH EXTRACTION      Prior to Admission medications   Medication Sig Start Date End Date Taking? Authorizing Provider  amphetamine-dextroamphetamine (ADDERALL) 10 MG tablet Take 10 mg by mouth 2 (two) times daily. 11/14/21  Yes [provider]  ASPIRIN 81 PO Take 81 mg by mouth at bedtime.   Yes [provider]  baclofen (LIORESAL) 10 MG tablet TAKE 1 TABLET(10 MG) BY MOUTH TWICE DAILY Patient taking differently: Take 20 mg by mouth at bedtime. 08/27/19  Yes Melvenia Beam, MD  bisoprolol (ZEBETA) 10 MG tablet Take 10 mg by mouth every evening.   Yes [provider]  calcium carbonate (OS-CAL) 1250 (500 CA) MG chewable tablet Chew 1 tablet by mouth daily. Not sure of dosage   Yes [provider]  diclofenac sodium (VOLTAREN) 1 % GEL Apply 4 g topically 4 (four) times daily. Patient taking differently: Apply 4 g topically daily as needed (For finger and arm pain). 08/20/18  Yes Melvenia Beam, MD  DULoxetine (CYMBALTA) 60 MG capsule Take 60 mg by mouth 2 (two) times daily. 08/06/16  Yes [provider]  esomeprazole (NEXIUM) 40 MG capsule Take 40 mg by mouth 2 (two) times daily before a meal.  12/20/14  Yes [provider]  furosemide (LASIX) 20 MG tablet Take 40 mg by mouth daily.   Yes [provider]  gabapentin (NEURONTIN) 300 MG capsule Take 1 capsule (300 mg total) by mouth 4 (four) times daily. Patient taking differently: Take 300 mg by mouth in the [name], at noon, in the evening, and at bedtime. 05/30/21  Yes Dohmeier, Asencion Partridge, MD  HYDROcodone-acetaminophen [facility] [address]) 10-325 MG tablet Take 1-2 tablets by mouth every 4 (four) hours as needed for moderate pain or severe pain. Takes for migraines Patient taking differently: Take 1 tablet by mouth in the [name], at  noon, in the evening, and at bedtime. 04/25/21  Yes Grier Mitts, PA-C  ondansetron (ZOFRAN-ODT) 4 MG disintegrating tablet DISSOLVE 1 TABLET(4 MG) ON THE TONGUE EVERY 8 HOURS AS NEEDED FOR NAUSEA OR VOMITING Patient taking differently: Take 4 mg by mouth every 8 (eight) hours as needed for nausea. 11/20/20  Yes Melvenia Beam, MD  PROAIR HFA [phone] Base) MCG/ACT inhaler Inhale 1 puff into the lungs every 6 (six) hours as needed for shortness of breath. 11/26/15  Yes [provider]  quiNINE (QUALAQUIN) 324 MG capsule TAKE ONE CAPSULE BY MOUTH TWICE A DAY Patient taking differently: Take 324 mg by mouth 2 (two) times  daily. 09/03/21  Yes Melvenia Beam, MD  rOPINIRole (REQUIP) 1 MG tablet 1 tablet in the [name], 2 tablets in mid afternoon and in the evening Patient taking differently: Take 1-2 mg by mouth See admin instructions. 1 tablet in the [name], 2 tablets by mouth in mid afternoon and in the evening per patient 06/04/21  Yes Kathrynn Ducking, MD  sulfamethoxazole-trimethoprim (BACTRIM DS) 800-160 MG tablet Take 1 tablet by mouth 2 (two) times daily. 01/30/22  Yes [provider]  triamcinolone ointment (KENALOG) 0.1 % Apply topically 2 (two) times daily. 01/30/22  Yes [provider]  Vitamin D, Ergocalciferol, (DRISDOL) 1.25 MG (50000 UNIT) CAPS capsule Take 1 capsule (50,000 Units total) by mouth every 7 (seven) days. 06/10/21  Yes Dohmeier, Asencion Partridge, MD  Baclofen 5 MG TABS Take 1 tablet by mouth daily. 01/14/22   [provider]  Erenumab-aooe (AIMOVIG) 140 MG/ML SOAJ Inject 140 mg into the skin every 30 (thirty) days. Patient not taking: Reported on 02/03/2022 11/19/20   Melvenia Beam, MD  Evolocumab (REPATHA SURECLICK) 852 MG/ML SOAJ Inject 1 pen into the skin every 14 (fourteen) days. Patient not taking: Reported on 02/03/2022 04/10/21   [name], MD    Scheduled Meds:  amphetamine-dextroamphetamine  10 mg Oral Q breakfast   aspirin  81  mg Oral QHS   baclofen  20 mg Oral QHS   calcium carbonate  1 tablet Oral Q breakfast   chlorhexidine  15 mL Mouth Rinse BID   doxycycline  100 mg Oral Q12H   DULoxetine  60 mg Oral BID   gabapentin  300 mg Oral TID   heparin  5,000 Units Subcutaneous Q8H   HYDROcodone-acetaminophen  1 tablet Oral QID   mouth rinse  15 mL Mouth Rinse q12n4p   midodrine  10 mg Oral TID WC   pantoprazole  40 mg Oral BID   polyethylene glycol  17 g Oral Daily   rOPINIRole  1 mg Oral Daily   And   rOPINIRole  2 mg Oral BID   senna  1 tablet Oral Daily   sodium chloride flush  3 mL Intravenous Q12H   sorbitol  45 mL Oral Once   triamcinolone ointment  1 application Topical BID   Vitamin D (Ergocalciferol)  50,000 Units Oral Q7 days   Continuous Infusions:  sodium chloride Stopped (02/08/22 0155)   ceFEPime (MAXIPIME) IV 2 g (02/10/22 0942)   PRN Meds:.sodium chloride, acetaminophen, albuterol, benzocaine, lidocaine (PF), ondansetron (ZOFRAN) IV, ondansetron, sodium chloride flush  Allergies as of 02/03/2022 - Review Complete 02/03/2022  Allergen Reaction Noted   Atorvastatin  03/12/2021   Crestor [rosuvastatin]  07/16/2021   Ezetimibe  03/12/2021   Amitriptyline Rash 09/03/2021    Family History  Problem Relation Age of Onset   CAD Mother    Hypertension Mother    Neuropathy Mother    Osteoarthritis Mother    Heart disease Mother    Breast cancer Maternal Grandmother    CAD Paternal Grandfather    Colon cancer Father     Social History   Socioeconomic History   Marital status: Married    Spouse name: Pilar Jarvis   Number of children: 3   Years of education: college   Highest education level: Not on file  Occupational History   Occupation: Retired  Tobacco Use   Smoking status: Never   Smokeless tobacco: Never  Vaping Use   Vaping Use: Never used  Substance and Sexual Activity  Alcohol use: Yes    Alcohol/week: 0.0 standard drinks    Comment: Occasional   Drug use: No    Sexual activity: Yes    Birth control/protection: Post-menopausal  Other Topics Concern   Not on file  Social History Narrative   Lives at home w/ her husband   Caffeine 8-10 cups daily.  (coffee 1 cup am, unsw tea all day long).    Social Determinants of Health   Financial Resource Strain: Not on file  Food Insecurity: Not on file  Transportation Needs: Not on file  Physical Activity: Not on file  Stress: Not on file  Social Connections: Not on file  Intimate Partner Violence: Not on file    Review of Systems: All negative except as stated above in HPI.  Physical Exam: Vital signs: Vitals:   02/10/22 0516 02/10/22 0800  BP: (!) 101/57 111/76  Pulse: (!) 108   Resp: 16 18  Temp: 98.9 F (37.2 C)   SpO2: 96%    Last BM Date : 02/06/22 (Per patient, last thursday) General:   Alert,  Well-developed, well-nourished, pleasant and cooperative in NAD HEENT : Normocephalic, atraumatic, extraocular movement intact Lungs:  No Visible respiratory distress Heart: Tachycardia Abdomen: Abdomen is distended with generalized discomfort on palpation, bowel sounds present, no peritoneal signs. Rectal:  Deferred  GI:  Lab Results: Recent Labs    02/09/22 1030 02/10/22 0811  WBC 6.4 8.5  HGB 10.4* 10.6*  HCT 31.7* 33.3*  PLT 303 337   BMET Recent Labs    02/08/22 0146 02/09/22 0253 02/10/22 0811  NA 137 140 141  K 4.8 4.3 4.2  CL 103 101 100  CO2 19* 25 29  GLUCOSE 102* 93 92  BUN 38* 41* 45*  CREATININE 2.19* 2.12* 2.39*  CALCIUM 8.9 8.9 9.0   LFT Recent Labs    02/09/22 1030 02/10/22 0811  PROT 6.7 6.5  ALBUMIN 2.7* 2.6*  AST 13* 15  ALT 16 15  ALKPHOS 99 106  BILITOT 0.7 0.4  BILIDIR 0.1  --   IBILI 0.6  --    PT/INR No results for input(s): LABPROT, INR in the last 72 hours.   Studies/Results: CT ABDOMEN PELVIS WO CONTRAST  Result Date: 02/10/2022 CLINICAL DATA:  Nausea and vomiting. Abdominal pain. KUB with moderate-to-large stool burden.  Ultrasound abdomen with gallstones but no evidence of cholecystitis. Heterogeneous echogenicity of liver. EXAM: CT ABDOMEN AND PELVIS WITHOUT CONTRAST TECHNIQUE: Multidetector CT imaging of the abdomen and pelvis was performed following the standard protocol without IV contrast. RADIATION DOSE REDUCTION: This exam was performed according to the departmental dose-optimization program which includes automated exposure control, adjustment of the mA and/or kV according to patient size and/or use of iterative reconstruction technique. COMPARISON:  Ultrasound right upper quadrant 02/09/2022; KUB 02/09/2022; CT abdomen and pelvis with contrast 10/16/2021 FINDINGS: Lower chest: Small left-greater-than-right pleural effusions new from 10/16/2021 prior CT. Associated subsegmental atelectasis. Heart size is moderately to markedly enlarged. Lack of intra-articular fluid limits evaluation of the abdominal and pelvic organ parenchyma. The following findings are made within this limitation. Hepatobiliary: Smooth liver contours. No gross liver lesion is seen. The gallbladder is moderately distended. There are layering gallstones corresponding to the gallstones seen on recent ultrasound. No intrahepatic or extrahepatic biliary ductal dilatation. Pancreas: Grossly unremarkable. No gross pancreatic ductal dilatation or surrounding inflammatory changes. Spleen: There is a 12 mm low-density lesion within the lateral aspect of the spleen, unchanged from prior. This is nonspecific but differential considerations  include a cyst or hemangioma. Adrenals/Urinary Tract: Normal adrenals. No renal stone or hydronephrosis. Lateral right upper pole fluid density 2.3 cm cyst as seen on recent ultrasound. Left upper pole low-density lesion measuring up to 12 mm too small to further characterize. This is grossly unchanged from 10/16/2021. The urinary bladder is moderately to markedly distended. No gross urinary bladder wall thickening is seen. No  stone is seen within either ureter. There is mild fullness of the bilateral renal pelves and ureters likely due to the bladder distention. Stomach/Bowel: Mild sigmoid diverticulosis. Stool is seen throughout all segments of the colon compatible with constipation. The terminal ileum is unremarkable. There is a tiny thin-walled possible appendix seen on coronal series 6, image 27 similar to prior. This is favored to represent the patient's normal appendix possibly mildly fall against itself but without definite inflammatory change. Postsurgical changes are again seen of surgical suture within the stomach and probable gastric bypass. Interval decrease in chronic mild dilatation at the small bowel anastomosis with internal air-fluid level. No dilated loops of bowel to indicate bowel obstruction. Vascular/Lymphatic: No abdominal aortic aneurysm. Mild atherosclerotic calcifications. No enlarged abdominal or pelvic lymph nodes. Reproductive: The uterus is present and grossly unremarkable. No adnexal masses. Other: There again ventral lower abdominal hernia mesh coils. No abdominal wall hernia. No free air or free fluid. Musculoskeletal: Chronic bilateral L5 pars defects. 10 mm grade 2 anterolisthesis of L5 on S1 unchanged. Severe L5-S1 disc space narrowing. Moderate to high-grade anterior bridging osteophytes throughout the lower thoracic spine to the L1-2 level. IMPRESSION:: IMPRESSION: 1. Small left-greater-than-right pleural effusions with associated atelectasis. 2. Cholelithiasis, as seen on recent ultrasound. 3. Moderate to high-grade urinary bladder distention. This is likely the cause for mild distention of the bilateral ureters and renal pelves. No obstructing stone is seen. 4. Stool throughout the colon compatible with constipation. No bowel obstruction. Postsurgical changes of gastric bypass. Electronically Signed   By: Yvonne Kendall M.D.   On: 02/10/2022 09:27   DG Abd 1 View  Result Date:  02/09/2022 CLINICAL DATA:  Diffuse abdominal pain and constipation for 1 week. EXAM: ABDOMEN - 1 VIEW COMPARISON:  None. FINDINGS: No evidence of dilated bowel loops. Moderate to large stool burden noted. Abdominal wall surgical mesh noted. Surgical clips also seen in the epigastric region and left abdomen. IMPRESSION: No acute findings. Moderate to large stool burden noted. Electronically Signed   By: Marlaine Hind M.D.   On: 02/09/2022 11:02   US Abdomen Limited RUQ (LIVER/GB)  Result Date: 02/09/2022 CLINICAL DATA:  Abdominal pain EXAM: ULTRASOUND ABDOMEN LIMITED RIGHT UPPER QUADRANT COMPARISON:  None. FINDINGS: Gallbladder: Gallbladder stones are seen. There is no significant wall thickening. Technologist observed tenderness over the gallbladder during the study. There is no fluid around the gallbladder. Common bile duct: Diameter: 6.4 mm Liver: There is inhomogeneous echogenicity in the visualized portions of liver. No focal abnormality is seen in the visualized portions of liver. Portal vein is patent on color Doppler imaging with normal direction of blood flow towards the liver. Other: Incidental note is made of 2.2 cm cyst in the upper pole of right kidney. IMPRESSION: Gallbladder stones. Technologist observed tenderness over the region of the gallbladder. There are no other imaging signs of acute cholecystitis. Please correlate with clinical symptoms and laboratory findings. There is heterogeneous echogenicity in the liver without focal abnormalities. 2.2 cm cyst is seen in the right kidney. Electronically Signed   By: Elmer Picker M.D.   On: 02/09/2022  12:58    Impression/Plan: -Abdominal pain with CT scan showing constipation and high-grade urinary bladder distention.  Abdominal pain improved some with a bowel movement today. -Nausea.  Multifactorial.  Possible component of BPPV.  -CHF -Pulmonary hypertension  Recommendations ------------------------ - According to patient, her nausea  is worse with change in posture as well as head movements.  She has used scopolamine patch in the past with improvement. -We will start scopolamine patch -Increase MiraLAX to twice a day -Continue Protonix twice a day -Continue other supportive care -Minimize narcotic use -May consider urology consult for high-grade urinary bladder distention -GI will follow    LOS: 7 days   Otis Brace  MD, FACP 02/10/2022, 10:14 AM  Contact #  [phone]

## 2022-02-10 NOTE — Assessment & Plan Note (Addendum)
Echocardiogram with LV EF 55 to 60% with preserved RV systolic function. No significant valvular disease.   Urine output over last 24 hrs is 1,200 ml. Continue to improve edema. Since admission negative fluid balance at - 5,626 ml.  Today transitioned to oral torsemide  Continue with midodrine for blood pressure support. Not able to start guideline directed therapy due to risk of hypotension and worsening renal failure.

## 2022-02-10 NOTE — Assessment & Plan Note (Addendum)
Continue supplemental 02 per [place] to keep 02 saturation 92% or greater. Echocardiogram with preserved RV systolic function.  Continue oxymetry monitoring.

## 2022-02-10 NOTE — Assessment & Plan Note (Signed)
Continue with statin therapy.  ?

## 2022-02-10 NOTE — Assessment & Plan Note (Addendum)
Patient with positive vertigo that has triggered significant nausea and vomiting.  Work up with abdomen CT with no acute changes. Positive cholelithiasis. Positive urinary retention.  No bowel obstruction.  Her urinary retention has resolved.   Symptoms have improved with meclizine, continue with tid regime.  Continue with PT and OT.  Continue with antiacid therapy with pantoprazole and sucralfate.  Continue ropinarole for restless leg syndrome.   Continue with gabapentin to 100 mg daily (lower dose) and continue with duloxetine.

## 2022-02-10 NOTE — Assessment & Plan Note (Signed)
Calculated BMI is 30.5

## 2022-02-10 NOTE — Consult Note (Signed)
Initial Consultation Note   Patient: [name]:[id number] DOB: October 15, 1946 PCP: [name], DO DOA: 02/03/2022 DOS: the patient was seen and examined on 02/10/2022 Primary service: Billy Rocco, Jimmy Picket,*  Referring physician: Dr. Aundra Dubin  Reason for consult: Medical Management     Assessment/Plan: Assessment and Plan: * Acute on chronic diastolic CHF (congestive heart failure) ([place])- (present on admission) Echocardiogram with LV EF 55 to 60% with preserved RV systolic function. No significant valvular disease.   Urine output over last 24 hrs is 1,600 ml.  Clinically her edema has been improving.  Plan to continue diuresis to target further negative fluid balance.  Continue with midodrine for blood pressure support.   Other secondary pulmonary hypertension ([place])- (present on admission) Continue supplemental 02 per [place] to keep 02 saturation 92% or greater. Echocardiogram with preserved RV systolic function.   Paroxysmal atrial fibrillation ([place])- (present on admission) Heart rate has been controlled. Currently not on AV blockers, continue antiplatelet therapy with aspirin. Continue telemetry monitoring.   Essential hypertension- (present on admission) Continue with midodrine for blood pressure support. Systolic blood pressure 95 to 115 mmHg.    Vertigo Patient with positive vertigo that has triggered significant nausea and vomiting. Poor oral intake.  Work up with abdomen CT with no acute changes. Positive cholelithiasis. Positive urinary retention.  No bowel obstruction.  Her urinary retention has resolved.   Plan to continue symptomatic control with meclizine tid and as needed antiemetics. Add antiacid therapy with pantoprazole and sucralfate.  Continue ropinarole for restless leg syndrome.   Discontinue baclofen Decrease dose of gabapentin to 100 mg daily  Continue with duloxetine   Hyperlipidemia- (present on admission) Continue with statin therapy.    Class 1 obesity- (present on admission) Calculated BMI is 30.5     HPI: [name] is a 76 y.o. female with past medical history of pulmonary hypertension, CVA, chronic thromboembolic disease, and dyslipidemia who was admitted to the cardiology service on 02/03/22 with the diagnosis of acute on chronic diastolic heart failure. 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 She has been placed on diuretic therapy with slow response. Hospitalization complicated by acute kidney injury and orthostatic hypotension. She developed nausea and vomiting and GI was consulted.   Today patient complains of nausea with vomiting, poor oral intake and vertigo, moderate to severe, persistent symptoms with no improvement factors and worse while moving her head. Continue to have dyspnea and lower extremity edema but improving from admission.    Review of Systems: As mentioned in the history of present illness. All other systems reviewed and are negative. 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   Arthritis    generalized   Asthma    Atrophic vaginitis    Back pain    DDD/stenosis   Carotid stenosis    Carotid US 10/16: Plaque RICA (8-11%), normal LICA   Depression    takes Cymbalta daily   Diverticulosis    benign   DJD (degenerative joint disease)    Dyslipidemia    Dysrhythmia    Family history of GI bleeding    GERD (gastroesophageal reflux disease)    takes Nexium daily   Gestational diabetes    Pt denies   H/O hiatal hernia    surgery for hernia   Headache(784.0)    takes Imitrex daily as needed and Bisoprolol daily;last migraine was about 2wks ago   History of bronchitis 1 yr ago   History  of shingles    Insomnia    takes Ambien nightly   Joint pain    Joint swelling    Leg cramps    takes Flexeril daily as needed   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   Nocturia    Osteoporosis    Peripheral neuropathy    takes Gabapentin daily   Pneumonia  6yrs ago   hx of   PONV (postoperative nausea and vomiting)    Restless leg syndrome    takes Requip daily   RLS (restless legs syndrome) 08/16/2015   Sleep apnea    uses CPAP   Stroke ([place])    Tubular adenoma of colon    Vertigo    Vitamin D deficiency    Past Surgical History:  Procedure Laterality Date   COLONOSCOPY     EP IMPLANTABLE DEVICE N/A 11/19/2016   Procedure: Loop Recorder Insertion;  Surgeon: Deboraha Sprang, MD;  Location: [facility];  Service: Cardiovascular;  Laterality: N/A;   ESOPHAGOGASTRODUODENOSCOPY     excess skin removal     post weight loss   GASTRIC BYPASS  2001   HERNIA REPAIR     umbilical   JOINT REPLACEMENT Right    knee   REVERSE SHOULDER ARTHROPLASTY Right 04/25/2021   Procedure: REVERSE SHOULDER ARTHROPLASTY carpal tunel realase right wrist;  Surgeon: Tania Ade, MD;  Location: [facility];  Service: Orthopedics;  Laterality: Right;   RIGHT HEART CATH N/A 02/06/2022   Procedure: RIGHT HEART CATH;  Surgeon: [name], MD;  Location: [facility];  Service: Cardiovascular;  Laterality: N/A;   skin reduction  2003 2004   stomach and arm   STEROID INJECTION TO SCAR     x 2 in back    TOTAL KNEE ARTHROPLASTY Left 04/03/2015   Procedure: LEFT TOTAL KNEE ARTHROPLASTY;  Surgeon: Melrose Nakayama, MD;  Location: [facility];  Service: Orthopedics;  Laterality: Left;   WISDOM TOOTH EXTRACTION     Social History:  reports that she has never smoked. She has never used smokeless tobacco. She reports current alcohol use. She reports that she does not use drugs.  Allergies  Allergen Reactions   Atorvastatin     Other reaction(s): myalgia   Crestor [Rosuvastatin]     Other reaction(s): myalgia   Ezetimibe     Other reaction(s): myalgia   Amitriptyline Rash    Family History  Problem Relation Age of Onset   CAD Mother    Hypertension Mother    Neuropathy Mother    Osteoarthritis Mother    Heart disease Mother    Breast cancer Maternal  Grandmother    CAD Paternal Grandfather    Colon cancer Father     Prior to Admission medications   Medication Sig Start Date End Date Taking? Authorizing Provider  amphetamine-dextroamphetamine (ADDERALL) 10 MG tablet Take 10 mg by mouth 2 (two) times daily. 11/14/21  Yes [provider]  ASPIRIN 81 PO Take 81 mg by mouth at bedtime.   Yes [provider]  baclofen (LIORESAL) 10 MG tablet TAKE 1 TABLET(10 MG) BY MOUTH TWICE DAILY Patient taking differently: Take 20 mg by mouth at bedtime. 08/27/19  Yes Melvenia Beam, MD  bisoprolol (ZEBETA) 10 MG tablet Take 10 mg by mouth every evening.   Yes [provider]  calcium carbonate (OS-CAL) 1250 (500 CA) MG chewable tablet Chew 1 tablet by mouth daily. Not sure of dosage   Yes [provider]  diclofenac sodium (  VOLTAREN) 1 % GEL Apply 4 g topically 4 (four) times daily. Patient taking differently: Apply 4 g topically daily as needed (For finger and arm pain). 08/20/18  Yes Melvenia Beam, MD  DULoxetine (CYMBALTA) 60 MG capsule Take 60 mg by mouth 2 (two) times daily. 08/06/16  Yes [provider]  esomeprazole (NEXIUM) 40 MG capsule Take 40 mg by mouth 2 (two) times daily before a meal.  12/20/14  Yes [provider]  furosemide (LASIX) 20 MG tablet Take 40 mg by mouth daily.   Yes [provider]  gabapentin (NEURONTIN) 300 MG capsule Take 1 capsule (300 mg total) by mouth 4 (four) times daily. Patient taking differently: Take 300 mg by mouth in the morning, at noon, in the evening, and at bedtime. 05/30/21  Yes Dohmeier, Asencion Partridge, MD  HYDROcodone-acetaminophen [facility]) 10-325 MG tablet Take 1-2 tablets by mouth every 4 (four) hours as needed for moderate pain or severe pain. Takes for migraines Patient taking differently: Take 1 tablet by mouth in the morning, at noon, in the evening, and at bedtime. 04/25/21  Yes Grier Mitts, PA-C  ondansetron (ZOFRAN-ODT) 4 MG disintegrating  tablet DISSOLVE 1 TABLET(4 MG) ON THE TONGUE EVERY 8 HOURS AS NEEDED FOR NAUSEA OR VOMITING Patient taking differently: Take 4 mg by mouth every 8 (eight) hours as needed for nausea. 11/20/20  Yes Melvenia Beam, MD  PROAIR HFA 108 [phone] Base) MCG/ACT inhaler Inhale 1 puff into the lungs every 6 (six) hours as needed for shortness of breath. 11/26/15  Yes [provider]  quiNINE (QUALAQUIN) 324 MG capsule TAKE ONE CAPSULE BY MOUTH TWICE A DAY Patient taking differently: Take 324 mg by mouth 2 (two) times daily. 09/03/21  Yes Melvenia Beam, MD  rOPINIRole (REQUIP) 1 MG tablet 1 tablet in the morning, 2 tablets in mid afternoon and in the evening Patient taking differently: Take 1-2 mg by mouth See admin instructions. 1 tablet in the morning, 2 tablets by mouth in mid afternoon and in the evening per patient 06/04/21  Yes Kathrynn Ducking, MD  sulfamethoxazole-trimethoprim (BACTRIM DS) 800-160 MG tablet Take 1 tablet by mouth 2 (two) times daily. 01/30/22  Yes [provider]  triamcinolone ointment (KENALOG) 0.1 % Apply topically 2 (two) times daily. 01/30/22  Yes [provider]  Vitamin D, Ergocalciferol, (DRISDOL) 1.25 MG (50000 UNIT) CAPS capsule Take 1 capsule (50,000 Units total) by mouth every 7 (seven) days. 06/10/21  Yes Dohmeier, Asencion Partridge, MD  Baclofen 5 MG TABS Take 1 tablet by mouth daily. 01/14/22   [provider]  Erenumab-aooe (AIMOVIG) 140 MG/ML SOAJ Inject 140 mg into the skin every 30 (thirty) days. Patient not taking: Reported on 02/03/2022 11/19/20   Melvenia Beam, MD  Evolocumab (REPATHA SURECLICK) 638 MG/ML SOAJ Inject 1 pen into the skin every 14 (fourteen) days. Patient not taking: Reported on 02/03/2022 04/10/21   [name], MD    Physical Exam: Vitals:   02/10/22 0501 02/10/22 0516 02/10/22 0800 02/10/22 1206  BP: (!) 95/56 (!) 101/57 111/76 (!) 115/47  Pulse: (!) 107 (!) 108    Resp: 16 16 18  (!) 22  Temp: 98.9 F (37.2 C) 98.9 F  (37.2 C)  98.7 F (37.1 C)  TempSrc:  Oral  Oral  SpO2:  96%  97%  Weight:      Height:       BP (!) 115/47 (BP Location: Left Arm)    Pulse (!) 108  Temp 98.7 F (37.1 C) (Oral)    Resp (!) 22    Ht 5\' 7"  (1.702 m)    Wt 88.2 kg    SpO2 97%    BMI 30.45 kg/m   Neurology awake and alert, mild confusion  ENT with no pallor Cardiovascular with S1 and S2 present with no gallops or rubs, no murmurs. No JVD Positive lower extremity edema more right than left Respiratory with no wheezing, rales or rhonchi Abdomen soft and non tender, not distended   Data Reviewed:      Family Communication: no family at the bedside, her friend is at the bedside  Primary team communication: yes  Thank you very much for involving [place] in the care of your patient.  Author: Tawni Millers, MD 02/10/2022 4:13 PM  For on call review [email or web address].

## 2022-02-10 NOTE — Progress Notes (Signed)
Patient is unable to swallow meds and food this morning. Spoke with MD and will give dulcolax suppository and if unsuccessful, will give enema.

## 2022-02-10 NOTE — Assessment & Plan Note (Addendum)
Heart rate has been controlled. Currently not on AV blockers, continue antiplatelet therapy with aspirin. HR has been in the 100 range.   Continue telemetry monitoring.

## 2022-02-10 NOTE — Evaluation (Signed)
Occupational Therapy Evaluation Patient Details Name: [name] MRN: [id number] DOB: [birth year] Today's Date: 02/10/2022   History of Present Illness Patient is a 76 yo female admitted on 02/03/22 with chest pain and DOE. Patient also with cellulitis, staph aureus and pseudomonas in BLEs. R heart cath 02/06/22. Also with R wrist fx and L D5 fx in 1/23.  PMH of CAD with anomalous origin of RCA, cryptogenic CVA s/p LINQ with no atrial fibrillation, carotid stenosis, sleep apnea, secondary pulmonary hypertension (V/Q scan negative for chronic thromboembolic disease), hyperlipidemia, restless leg syndrome, peripheral neuropathy and GERD   Clinical Impression   Prior to this recent admission, patient was requiring increased assist since January 2023 due to R wrist fracture and frequent falls (husband endorses 2 falls/week). Patient fully independent and driving in December of 2022. Patient was requiring mod A from husband for toileting and lower body dressing, but otherwise independent. Currently, patient is requiring max to total A for ADLs, and mod A of 2 in order to complete transfers (PT and OT completing 2 person HHA due to BUE fractures). Of note, patient with R wrist fracture with cast that falls off (per patient and family) with unknown weight bearing orders, and L D5 fracture with also unknown weight bearing orders. Patient also with decreased cognition, and jerking in all four extremities limiting functional mobility. Due to multi-systematic involvement, OT recommending AIR in order to address deficits. Patient with strong family support and highly motivated. Acute OT will continue to follow in order to address functional deficits listed below.      Recommendations for follow up therapy are one component of a multi-disciplinary discharge planning process, led by the attending physician.  Recommendations may be updated based on patient status, additional functional criteria and insurance  authorization.   Follow Up Recommendations  Acute inpatient rehab (3hours/day)    Assistance Recommended at Discharge Frequent or constant Supervision/Assistance  Patient can return home with the following Two people to help with walking and/or transfers;A lot of help with bathing/dressing/bathroom;Assistance with cooking/housework;Direct supervision/assist for medications management;Direct supervision/assist for financial management;Assist for transportation    Functional Status Assessment  Patient has had a recent decline in their functional status and demonstrates the ability to make significant improvements in function in a reasonable and predictable amount of time.  Equipment Recommendations  Other (comment) (Defer to next venue)    Recommendations for Other Services Rehab consult;Other (comment) (Ortho consult)     Precautions / Restrictions Precautions Precautions: Fall;Other (comment) Restrictions Weight Bearing Restrictions: No Other Position/Activity Restrictions: R wrist fx and L D5 fx, contacting ortho for WB status      Mobility Bed Mobility Overal bed mobility: Needs Assistance Bed Mobility: Supine to Sit     Supine to sit: Mod assist     General bed mobility comments: Mod A in order to guide BLEs back into bed    Transfers Overall transfer level: Needs assistance Equipment used: 2 person hand held assist Transfers: Sit to/from Stand, Bed to chair/wheelchair/BSC Sit to Stand: Mod assist, +2 physical assistance, +2 safety/equipment Stand pivot transfers: Mod assist, +2 physical assistance, +2 safety/equipment         General transfer comment: Due to unknown WB status PT and OT providing 2 person HHA (OT supporting at R elbow), patient with posterior lean, and difficulty sequencing steps due to jerking motions in all four extremities, poor eccentric control to come into sitting      Balance Overall balance assessment: Needs assistance Sitting-balance  support: Bilateral upper extremity supported, Feet supported Sitting balance-Leahy Scale: Poor Sitting balance - Comments: frequent jerking motions with difficultly maintain sitting balance without external support Postural control: Posterior lean Standing balance support: Bilateral upper extremity supported, During functional activity Standing balance-Leahy Scale: Poor Standing balance comment: Reliant on BUE external support                           ADL either performed or assessed with clinical judgement   ADL Overall ADL's : Needs assistance/impaired Eating/Feeding: Minimal assistance;Sitting   Grooming: Minimal assistance;Sitting   Upper Body Bathing: Minimal assistance;Sitting   Lower Body Bathing: Maximal assistance;Sitting/lateral leans;Total assistance;Sit to/from stand   Upper Body Dressing : Minimal assistance;Sitting   Lower Body Dressing: Maximal assistance;Total assistance;Sitting/lateral leans   Toilet Transfer: Moderate assistance;+2 for physical assistance;+2 for safety/equipment;Cueing for safety;Cueing for sequencing Toilet Transfer Details (indicate cue type and reason): Simulated with 2 sit<>stands from recliner, due to R radial fx OT providing support at elbow, will consult ortho for WB status Toileting- Clothing Manipulation and Hygiene: Total assistance;Sit to/from stand       Functional mobility during ADLs: Moderate assistance;+2 for physical assistance;+2 for safety/equipment;Cueing for safety;Cueing for sequencing General ADL Comments: Patient presenting with decreased activity tolerance, shaking/jerking in all four extremities, decreased cognition, R wrist fx, and L D5 fx with unknown WB status     Vision Baseline Vision/History: 0 No visual deficits Ability to See in Adequate Light: 0 Adequate Patient Visual Report: No change from baseline       Perception     Praxis      Pertinent Vitals/Pain Pain Assessment Pain Assessment:  Faces Faces Pain Scale: Hurts a little bit Pain Location: nausea, and generalized with movement Pain Descriptors / Indicators: Grimacing, Guarding, Discomfort Pain Intervention(s): Limited activity within patient's tolerance, Monitored during session, Repositioned     Hand Dominance Right   Extremity/Trunk Assessment Upper Extremity Assessment Upper Extremity Assessment: RUE deficits/detail;LUE deficits/detail;Generalized weakness RUE Deficits / Details: R wrist fx with cast, significant difficulty completing mobility from shoulder, no WB status specified RUE: Unable to fully assess due to immobilization RUE Sensation: history of peripheral neuropathy RUE Coordination: decreased fine motor;decreased gross motor LUE Deficits / Details: L D5 fx, unable to spontaneously grasp, but with provided stimuli can grasp around, D5 is flexed at DIP and patient is unable to straighten LUE: Unable to fully assess due to immobilization LUE Sensation: history of peripheral neuropathy LUE Coordination: decreased gross motor;decreased fine motor   Lower Extremity Assessment Lower Extremity Assessment: Defer to PT evaluation   Cervical / Trunk Assessment Cervical / Trunk Assessment: Kyphotic   Communication     Cognition Arousal/Alertness: Lethargic Behavior During Therapy: Flat affect Overall Cognitive Status: Impaired/Different from baseline Area of Impairment: Attention, Memory, Following commands, Safety/judgement, Awareness, Problem solving                   Current Attention Level: Sustained Memory: Decreased recall of precautions, Decreased short-term memory Following Commands: Follows one step commands inconsistently, Follows multi-step commands inconsistently, Follows one step commands with increased time Safety/Judgement: Decreased awareness of safety, Decreased awareness of deficits Awareness: Emergent Problem Solving: Slow processing, Decreased initiation, Difficulty  sequencing, Requires verbal cues, Requires tactile cues General Comments: Patient A&Ox4 but perseverating on "feeling sick" requiring prompting to answer questions on PLOF with assist from husband, multi-modal cues to sequence basic movements, cues to maintain attention to tasks     General Comments  Burns on BLEs (wound care following)    Exercises     Shoulder Instructions      Home Living Family/patient expects to be discharged to:: Private residence Living Arrangements: Spouse/significant other Available Help at Discharge: Family;Friend(s);Available 24 hours/day Type of Home: House Home Access: Level entry     Home Layout: One level     Bathroom Shower/Tub: Occupational psychologist: Handicapped height Bathroom Accessibility: Yes How Accessible: Accessible via walker Home Equipment: [place] (2 wheels);Cane - single point;Shower seat (Toilet riser with handles and bed with adjustable head and foot (no rails on bed))   Additional Comments: Spouse reporting falling 2x/wk      Prior Functioning/Environment Prior Level of Function : History of Falls (last six months);Needs assist             Mobility Comments: Prior to January was driving and walking intermittenly with a cane, now having to walk with a walker and with falls 2x/wk ADLs Comments: Needing assist to complete toileting and get off the commode and with lower body dressing        OT Problem List: Decreased strength;Decreased range of motion;Decreased activity tolerance;Impaired balance (sitting and/or standing);Decreased coordination;Decreased cognition;Decreased safety awareness;Decreased knowledge of use of DME or AE;Decreased knowledge of precautions;Cardiopulmonary status limiting activity;Impaired UE functional use;Increased edema      OT Treatment/Interventions: Self-care/ADL training;Therapeutic exercise;Energy conservation;DME and/or AE instruction;Therapeutic activities;Splinting;Manual  therapy;Cognitive remediation/compensation;Patient/family education;Balance training    OT Goals(Current goals can be found in the care plan section) Acute Rehab OT Goals Patient Stated Goal: to get this figured out OT Goal Formulation: With patient/family Time For Goal Achievement: 02/24/22 Potential to Achieve Goals: Fair  OT Frequency: Min 2X/week    Co-evaluation PT/OT/SLP Co-Evaluation/Treatment: Yes Reason for Co-Treatment: Complexity of the patient's impairments (multi-system involvement);Necessary to address cognition/behavior during functional activity;For patient/therapist safety;To address functional/ADL transfers   OT goals addressed during session: ADL's and self-care;Strengthening/ROM      AM-PAC OT "6 Clicks" Daily Activity     Outcome Measure Help from another person eating meals?: A Little Help from another person taking care of personal grooming?: A Little Help from another person toileting, which includes using toliet, bedpan, or urinal?: Total Help from another person bathing (including washing, rinsing, drying)?: A Lot Help from another person to put on and taking off regular upper body clothing?: A Little Help from another person to put on and taking off regular lower body clothing?: A Lot 6 Click Score: 14   End of Session Equipment Utilized During Treatment: Gait belt Nurse Communication: Mobility status;Precautions;Weight bearing status  Activity Tolerance: Patient limited by fatigue;Patient limited by lethargy Patient left: with bed alarm set;in bed;with call bell/phone within reach;with family/visitor present  OT Visit Diagnosis: Unsteadiness on feet (R26.81);Other abnormalities of gait and mobility (R26.89);Repeated falls (R29.6);Muscle weakness (generalized) (M62.81);History of falling (Z91.81);Ataxia, unspecified (R27.0);Other symptoms and signs involving the nervous system (R29.898);Other symptoms and signs involving cognitive function                 Time: 5621-3086 OT Time Calculation (min): 42 min Charges:  OT General Charges $OT Visit: 1 Visit OT Evaluation $OT Eval High Complexity: 1 High  Corinne Ports E. Donnisha Besecker, OTR/L Acute Rehabilitation Services [phone] [place] 02/10/2022, 12:48 PM

## 2022-02-10 NOTE — Evaluation (Signed)
Physical Therapy Evaluation Patient Details Name: [name] MRN: [id number] DOB: 1945/12/26 Today's Date: 02/10/2022  History of Present Illness  Patient is a 76 yo female admitted on 02/03/22 with chest pain and DOE. Patient also with cellulitis, staph aureus and pseudomonas in BLEs. R heart cath 02/06/22. Also with R wrist fx and L D5 fx in 1/23.  PMH of CAD with anomalous origin of RCA, cryptogenic CVA s/p LINQ with no atrial fibrillation, carotid stenosis, sleep apnea, secondary pulmonary hypertension (V/Q scan negative for chronic thromboembolic disease), hyperlipidemia, restless leg syndrome, peripheral neuropathy and GERD  Clinical Impression  PTA, pt lives with her spouse, was requiring increased assist since January 2023 due to R wrist fracture and frequent falls. Pt fully independent and driving in December of 2022. Pt currently requiring two person moderate assist for transfers to standing, pre gait, and taking several steps forward/backwards. Noted to have myoclonic like jerking in all four extremities. Also, pt husband reporting R wrist fracture and L 5th finger fx 3 weeks ago; right arm with cast that frequently slips on/off. Notified MD Arrien for potential ortho consult; will treat R hand as NWB currently. Pt presents with poor sitting/standing balance, decreased cognition, decreased activity tolerance and generalized weakness. In setting of medical complexities and deficits listed above, recommend AIR to address, maximize functional mobility and decrease caregiver burden.     Recommendations for follow up therapy are one component of a multi-disciplinary discharge planning process, led by the attending physician.  Recommendations may be updated based on patient status, additional functional criteria and insurance authorization.  Follow Up Recommendations Acute inpatient rehab (3hours/day)    Assistance Recommended at Discharge Frequent or constant Supervision/Assistance  Patient can  return home with the following  A lot of help with walking and/or transfers;A lot of help with bathing/dressing/bathroom;Direct supervision/assist for medications management;Assist for transportation    Equipment Recommendations BSC/3in1;Wheelchair (measurements PT);Wheelchair cushion (measurements PT)  Recommendations for Other Services  Rehab consult    Functional Status Assessment Patient has had a recent decline in their functional status and demonstrates the ability to make significant improvements in function in a reasonable and predictable amount of time.     Precautions / Restrictions Precautions Precautions: Fall;Other (comment) Precaution Comments: myoclonic jerking Restrictions Weight Bearing Restrictions: Yes RUE Weight Bearing: Non weight bearing Other Position/Activity Restrictions: R wrist fx and L 5th finger fx, contacting ortho for WB status      Mobility  Bed Mobility Overal bed mobility: Needs Assistance Bed Mobility: Sit to Supine       Sit to supine: Mod assist   General bed mobility comments: Mod A in order to guide BLEs back into bed    Transfers Overall transfer level: Needs assistance Equipment used: 2 person hand held assist Transfers: Sit to/from Stand, Bed to chair/wheelchair/BSC Sit to Stand: Mod assist, +2 physical assistance, +2 safety/equipment Stand pivot transfers: Mod assist, +2 physical assistance, +2 safety/equipment         General transfer comment: Due to unknown WB status PT and OT providing 2 person HHA (OT supporting at R elbow), patient with posterior lean, and difficulty sequencing steps due to jerking motions in all four extremities, poor eccentric control to come into sitting    Ambulation/Gait                  Stairs            Wheelchair Mobility    Modified Rankin (Stroke Patients Only)  Balance Overall balance assessment: Needs assistance Sitting-balance support: Bilateral upper extremity  supported, Feet supported Sitting balance-Leahy Scale: Poor Sitting balance - Comments: frequent jerking motions with difficultly maintain sitting balance without external support Postural control: Posterior lean Standing balance support: Bilateral upper extremity supported, During functional activity Standing balance-Leahy Scale: Poor Standing balance comment: Reliant on BUE external support                             Pertinent Vitals/Pain Pain Assessment Pain Assessment: Faces Faces Pain Scale: Hurts a little bit Pain Location: nausea, and generalized with movement Pain Descriptors / Indicators: Grimacing, Guarding, Discomfort Pain Intervention(s): Limited activity within patient's tolerance, Monitored during session    Home Living Family/patient expects to be discharged to:: Private residence Living Arrangements: Spouse/significant other Available Help at Discharge: Family;Friend(s);Available 24 hours/day Type of Home: House Home Access: Level entry       Home Layout: One level Home Equipment: Conservation officer, nature (2 wheels);Cane - single point;Shower seat (Toilet riser with handles and bed with adjustable head and foot (no rails on bed)) Additional Comments: Spouse reporting falling 2x/wk    Prior Function Prior Level of Function : History of Falls (last six months);Needs assist             Mobility Comments: Prior to January was driving and walking intermittenly with a cane, now having to walk with a walker and with falls 2x/wk ADLs Comments: Needing assist to complete toileting and get off the commode and with lower body dressing     Hand Dominance   Dominant Hand: Right    Extremity/Trunk Assessment   Upper Extremity Assessment Upper Extremity Assessment: RUE deficits/detail;LUE deficits/detail RUE Deficits / Details: R wrist fx with cast, significant difficulty completing mobility from shoulder, no WB status specified RUE: Unable to fully assess due to  immobilization RUE Sensation: history of peripheral neuropathy RUE Coordination: decreased fine motor;decreased gross motor LUE Deficits / Details: L D5 fx, unable to spontaneously grasp, but with provided stimuli can grasp around, D5 is flexed at DIP and patient is unable to straighten LUE: Unable to fully assess due to immobilization LUE Sensation: history of peripheral neuropathy LUE Coordination: decreased gross motor;decreased fine motor    Lower Extremity Assessment Lower Extremity Assessment: RLE deficits/detail;LLE deficits/detail RLE Deficits / Details: Grossly 3+/5 RLE Sensation: history of peripheral neuropathy LLE Deficits / Details: Grossly 3+/5 LLE Sensation: history of peripheral neuropathy    Cervical / Trunk Assessment Cervical / Trunk Assessment: Kyphotic  Communication   Communication: No difficulties  Cognition Arousal/Alertness: Awake/alert Behavior During Therapy: Flat affect Overall Cognitive Status: Impaired/Different from baseline Area of Impairment: Attention, Memory, Following commands, Safety/judgement, Awareness, Problem solving                   Current Attention Level: Sustained Memory: Decreased recall of precautions, Decreased short-term memory Following Commands: Follows one step commands inconsistently, Follows multi-step commands inconsistently, Follows one step commands with increased time Safety/Judgement: Decreased awareness of safety, Decreased awareness of deficits Awareness: Emergent Problem Solving: Slow processing, Decreased initiation, Difficulty sequencing, Requires verbal cues, Requires tactile cues General Comments: Patient A&Ox4 but perseverating on "feeling sick" requiring prompting to answer questions on PLOF with assist from husband, multi-modal cues to sequence basic movements, cues to maintain attention to tasks        General Comments General comments (skin integrity, edema, etc.): Burns on BLE's (wound care  following). SpO2 96% on 2L O2,  HR 108-126 bpm    Exercises     Assessment/Plan    PT Assessment Patient needs continued PT services  PT Problem List Decreased strength;Decreased activity tolerance;Decreased balance;Decreased mobility;Decreased coordination;Decreased cognition;Decreased safety awareness;Decreased knowledge of precautions       PT Treatment Interventions DME instruction;Stair training;Gait training;Functional mobility training;Therapeutic activities;Therapeutic exercise;Balance training;Patient/family education    PT Goals (Current goals can be found in the Care Plan section)  Acute Rehab PT Goals Patient Stated Goal: pt spouse would like her to go to rehab PT Goal Formulation: With patient/family Time For Goal Achievement: 02/24/22 Potential to Achieve Goals: Good    Frequency Min 3X/week     Co-evaluation PT/OT/SLP Co-Evaluation/Treatment: Yes Reason for Co-Treatment: Complexity of the patient's impairments (multi-system involvement);To address functional/ADL transfers;Necessary to address cognition/behavior during functional activity;For patient/therapist safety   OT goals addressed during session: ADL's and self-care;Strengthening/ROM       AM-PAC PT "6 Clicks" Mobility  Outcome Measure Help needed turning from your back to your side while in a flat bed without using bedrails?: A Little Help needed moving from lying on your back to sitting on the side of a flat bed without using bedrails?: A Lot Help needed moving to and from a bed to a chair (including a wheelchair)?: A Lot Help needed standing up from a chair using your arms (e.g., wheelchair or bedside chair)?: A Lot Help needed to walk in hospital room?: Total Help needed climbing 3-5 steps with a railing? : Total 6 Click Score: 11    End of Session Equipment Utilized During Treatment: Gait belt;Oxygen Activity Tolerance: Patient tolerated treatment well Patient left: in bed;with call bell/phone  within reach;with bed alarm set Nurse Communication: Mobility status PT Visit Diagnosis: Unsteadiness on feet (R26.81);Other abnormalities of gait and mobility (R26.89);Muscle weakness (generalized) (M62.81);History of falling (Z91.81);Difficulty in walking, not elsewhere classified (R26.2)    Time: 1610-9604 PT Time Calculation (min) (ACUTE ONLY): 26 min   Charges:   PT Evaluation $PT Eval Moderate Complexity: 1 Mod          Wyona Almas, PT, DPT Acute Rehabilitation Services Pager [phone] Office [phone]   Deno Etienne 02/10/2022, 1:26 PM

## 2022-02-10 NOTE — Progress Notes (Signed)
Had urine output x 2.   Bladder scan with no residual urine.   Can d/c foley order   Yomaira [name] NP-C  12:06 PM

## 2022-02-10 NOTE — Progress Notes (Addendum)
Advanced Heart Failure Rounding Note  PCP-Cardiologist: [name] III, MD   Subjective:    Developed severe nausea + abdominal pain over the weekend. KUB with moderate to large stool burden. US abdomen with gallstones but no evidence of cholecystitis. Heterogenous echogenicity of liver.   Lactic acid 0.9. AF, no leukocytosis.   Reports ongoing nausea and abdominal pain. Poor po intake the last couple of days. Cannot recall last bowel movement. Feels like food gets stuck when swallows. Also notes dysuria.  Weight down another 4 lb overnight, 14 lb since admit. On lasix 80 mg IV BID.  Scr 1.8>1.99>2.04>2.19>2.12. No BMET today.  SBP 90s-100s.   [place] 2/23:  RA mean 16 PA 55/27 PCWP mean 23 CI 3.7 PVR 2 WU  Objective:   Weight Range: 88.2 kg Body mass index is 30.45 kg/m.   Vital Signs:   Temp:  [98.3 F (36.8 C)-98.9 F (37.2 C)] 98.9 F (37.2 C) (02/27 0516) Pulse Rate:  [101-111] 108 (02/27 0516) Resp:  [11-19] 16 (02/27 0516) BP: (95-122)/(49-58) 101/57 (02/27 0516) SpO2:  [96 %-98 %] 96 % (02/27 0516) Weight:  [88.2 kg-90 kg] 88.2 kg (02/27 0500) Last BM Date : 02/06/22 (Per patient, last thursday)  Weight change: Filed Weights   02/09/22 0502 02/09/22 1904 02/10/22 0500  Weight: 90 kg 90 kg 88.2 kg    Intake/Output:   Intake/Output Summary (Last 24 hours) at 02/10/2022 0703 Last data filed at 02/10/2022 0502 Gross per 24 hour  Intake 603 ml  Output 1600 ml  Net -997 ml      Physical Exam    General:  Appears uncomfortable. Lying in bed. HEENT: normal Neck: supple. JVP 10 cm. Carotids 2+ bilat; no bruits.  Cor: PMI nondisplaced. Regular rhythm, tachy. No rubs, gallops or murmurs. Lungs: clear Abdomen: diffusely tender with palpation, + distended. Hypoactive bowel sounds. Extremities: no cyanosis, clubbing, rash, 1+edema, TED hose on. Neuro: alert & oriented x 3, cranial nerves grossly intact. Appears confused    Telemetry   Sinus  tach 100s  Labs    CBC Recent Labs    02/09/22 1030  WBC 6.4  NEUTROABS 4.2  HGB 10.4*  HCT 31.7*  MCV 101.3*  PLT 427   Basic Metabolic Panel Recent Labs    02/08/22 0146 02/09/22 0253  NA 137 140  K 4.8 4.3  CL 103 101  CO2 19* 25  GLUCOSE 102* 93  BUN 38* 41*  CREATININE 2.19* 2.12*  CALCIUM 8.9 8.9   Liver Function Tests Recent Labs    02/09/22 1030  AST 13*  ALT 16  ALKPHOS 99  BILITOT 0.7  PROT 6.7  ALBUMIN 2.7*   Recent Labs    02/09/22 1030  LIPASE 26  AMYLASE 18*   Cardiac Enzymes No results for input(s): CKTOTAL, CKMB, CKMBINDEX, TROPONINI in the last 72 hours.  BNP: BNP (last 3 results) Recent Labs    02/03/22 1544  BNP 841.9*    ProBNP (last 3 results) No results for input(s): PROBNP in the last 8760 hours.   D-Dimer No results for input(s): DDIMER in the last 72 hours. Hemoglobin A1C No results for input(s): HGBA1C in the last 72 hours. Fasting Lipid Panel No results for input(s): CHOL, HDL, LDLCALC, TRIG, CHOLHDL, LDLDIRECT in the last 72 hours.  Thyroid Function Tests No results for input(s): TSH, T4TOTAL, T3FREE, THYROIDAB in the last 72 hours.  Invalid input(s): FREET3  Other results:   Imaging    DG Abd  1 View  Result Date: 02/09/2022 CLINICAL DATA:  Diffuse abdominal pain and constipation for 1 week. EXAM: ABDOMEN - 1 VIEW COMPARISON:  None. FINDINGS: No evidence of dilated bowel loops. Moderate to large stool burden noted. Abdominal wall surgical mesh noted. Surgical clips also seen in the epigastric region and left abdomen. IMPRESSION: No acute findings. Moderate to large stool burden noted. Electronically Signed   By: Marlaine Hind M.D.   On: 02/09/2022 11:02   US Abdomen Limited RUQ (LIVER/GB)  Result Date: 02/09/2022 CLINICAL DATA:  Abdominal pain EXAM: ULTRASOUND ABDOMEN LIMITED RIGHT UPPER QUADRANT COMPARISON:  None. FINDINGS: Gallbladder: Gallbladder stones are seen. There is no significant wall thickening.  Technologist observed tenderness over the gallbladder during the study. There is no fluid around the gallbladder. Common bile duct: Diameter: 6.4 mm Liver: There is inhomogeneous echogenicity in the visualized portions of liver. No focal abnormality is seen in the visualized portions of liver. Portal vein is patent on color Doppler imaging with normal direction of blood flow towards the liver. Other: Incidental note is made of 2.2 cm cyst in the upper pole of right kidney. IMPRESSION: Gallbladder stones. Technologist observed tenderness over the region of the gallbladder. There are no other imaging signs of acute cholecystitis. Please correlate with clinical symptoms and laboratory findings. There is heterogeneous echogenicity in the liver without focal abnormalities. 2.2 cm cyst is seen in the right kidney. Electronically Signed   By: Elmer Picker M.D.   On: 02/09/2022 12:58     Medications:     Scheduled Medications:  amphetamine-dextroamphetamine  10 mg Oral Q breakfast   aspirin  81 mg Oral QHS   baclofen  20 mg Oral QHS   calcium carbonate  1 tablet Oral Q breakfast   chlorhexidine  15 mL Mouth Rinse BID   doxycycline  100 mg Oral Q12H   DULoxetine  60 mg Oral BID   furosemide  80 mg Intravenous BID   gabapentin  300 mg Oral TID   heparin  5,000 Units Subcutaneous Q8H   HYDROcodone-acetaminophen  1 tablet Oral QID   mouth rinse  15 mL Mouth Rinse q12n4p   midodrine  10 mg Oral TID WC   pantoprazole  40 mg Oral BID   polyethylene glycol  17 g Oral Daily   rOPINIRole  1 mg Oral Daily   And   rOPINIRole  2 mg Oral BID   sodium chloride flush  3 mL Intravenous Q12H   triamcinolone ointment  1 application Topical BID   Vitamin D (Ergocalciferol)  50,000 Units Oral Q7 days    Infusions:  sodium chloride Stopped (02/08/22 0155)   ceFEPime (MAXIPIME) IV      PRN Medications: sodium chloride, acetaminophen, albuterol, benzocaine, lidocaine (PF), ondansetron (ZOFRAN) IV,  ondansetron, sodium chloride flush    Patient Profile   76 y/o Female w/ chronic diastolic heart failure, h/o orthostatic hypotension with multiple falls, exertional dyspnea and severe peripheral neuropathy of uncertain etiology, admitted for acute on chronic diastolic heart failure and AKI. RHC showed elevated filling pressures, normal CO. Being evaluated for amyloid.   Assessment/Plan   1. Acute on chronic diastolic CHF: Echo from this admission shows EF 55-60%, mild LVH, grade II diastolic dysfunction, normal RV size and systolic function, mild aortic stenosis with mild-moderate aortic insufficiency, mild-moderate MR. No significant CAD on remote cath but had anomalous RCA.  Cardiolite this admission showed EF 50%, no ischemia/infarction. PYP scan was not suggestive of TTR cardiac amyloidosis.  cMRI  was somewhat equivocal => LV walls not thickened and there was delayed enhancement in the basal inferior wall in a subepicardial pattern more concerning for myocarditis than amyloidosis.  However, the extracellular volume percentage was 49%, which is suggestive of amyloidosis. At this point, will need to wait for myeloma labs (?AL amyloidosis).   - She has severe peripheral neuropathy of uncertain etiology, orthostatic hypotension likely from autonomic neuropathy, and bilateral carpal tunnel syndrome.  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  However, her echo is not very impressive for amyloidosis (minimal LVH).  - Patient is volume overloaded on exam and by RHC but has had rise in creatinine from baseline 1.7 to 1.99 with attempts at diuresis (also in setting of contrast load and Bactrim use).   - Diuresing slowly with IV Lasix 80 mg bid. Down 14 lb from admit. Still appears slightly volume up, but poor po intake last couple of days. Hold diuretics today. - continue midodrine for BP support  2. AKI on CKD stage 3: Prior creatinine 1.4.  At admission, she was 1.74. Scr  progressively rising, 1.80>>1.99>>2.04>>2.19>>2.12 today. Normal CO on RHC. Contrast nephropathy may play a role (had PE CTA at admission).  Also consider Bactrim.  - Avoid contrast.  - Bactrim now discontinued - avoid hypotension. Continue midodrine to support BP  3. Skin: Concern skin infection, primarily lower legs.  Culture from leg lesions at outside dermatologist positive for S. Aureus + Pseudomonas. Both sensitive to ciprofloxacin. Now on doxycycline + cefepime.  4. Orthostatic hypotension: In setting of volume overload.  Suspect autonomic neuropathy. Improving.  - Wear compression stockings.  - Wear abdominal binder.  - Continue midodrine 10 mg tid.  5. H/o CVA: Cryptogenic.  6. Aortic valve disorder: Mild AS, mild-moderate AI on 2/23 echo.  7. Elevated troponin: Mild elevation with no trend.  No chest pain currently.  Cardiolite with no ischemia.  Suspect demand ischemia from volume overload.  8. Nausea/abdominal pain: Large stool burden on KUB. Gall stones on US abdomen but no evidence of cholecystitis. AF. No leukocytosis. Lactic acid 0.9 yesterday. Lipase WNL, amylase 18. LFTs not elevated. Recheck lactic acid, obtain NH3, CMET, CBC. Appears more confused than last week. Check UA. STAT CT abdomen and pelvis. Check ECG, reports some chest discomfort but unable to describe.  GI consult. Has hx gastric bypass.  PT/OT eval   Length of Stay: 7  FINCH, LINDSAY N, PA-C  02/10/2022, 7:03 AM  Advanced Heart Failure Team Pager [phone] (M-F; 7a - 5p)  Please contact [facility] for night-coverage after hours (5p -7a ) and weekends on [email or web address]  Patient seen with PA, agree with the above note.   Significant abdominal pain as well as nausea/vomiting today.    Lactate normal, NH3 normal, LFTs normal.  WBCs normal, no fever, PCT < 0.1.  Creatinine up to 2.39. CT abdomen/pelvis with cholelithiasis, urinary bladder distention, stool throughout colon consistent with constipation.   She  has felt better after a large BM.  After voiding, bladder scan did not show significant urine.   General: uncomfortable.  Neck: JVP 8-9 cm, no thyromegaly or thyroid nodule.  Lungs: Clear to auscultation bilaterally with normal respiratory effort. CV: Nondisplaced PMI.  Heart regular S1/S2, no S3/S4, no murmur.  1+ ankle edema.  Abdomen: Soft, right-sided tenderness, no hepatosplenomegaly, no distention.  Skin: Intact without lesions or rashes.  Neurologic: Alert and oriented x 3.  Psych: Normal affect. Extremities: No clubbing or cyanosis.  HEENT: Normal.  Abdominal pain earlier was very concerning, but testing so far has been reassuring.  I suspect her symptoms may have been due to constipation, improved with BM.  She has cholelithiasis but no evidence for cholecystitis.  Labs unremarkable.   Creatinine higher, will hold Lasix today.  Overall, weight down 13-14 lbs.  cMRI and PYP equivocal for cardiac amyloidosis, still waiting for myeloma studies.  Would order testing for hereditary TTR cardiac amyloidosis as outpatient, consider repeating PYP scan in 4-6 months. I do not think we have enough evidence yet to treat.   With wounds growing Pseudomonas and S aureus, will as ID to see.  She is currently on doxycycline and cefepime.   Loralie Champagne 02/10/2022 1:38 PM
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
## 2022-02-10 NOTE — Progress Notes (Signed)
Inpatient Rehab Admissions Coordinator:   Per therapy recommendations,  patient was screened for CIR candidacy by Keontre Defino, MS, CCC-SLP . At this time, Pt. Appears to be a a potential candidate for CIR. I will request   order for rehab consult per protocol for full assessment. Please contact me any with questions.  Kevon Tench, MS, CCC-SLP Rehab Admissions Coordinator  [phone] (celll) [phone] (office)  

## 2022-02-10 NOTE — Assessment & Plan Note (Addendum)
Continue with midodrine for blood pressure support. Systolic blood pressure 872 to 130 mmHg.

## 2022-02-10 NOTE — Progress Notes (Signed)
PT Cancellation Note  Patient Details Name: [name] MRN: [id number] DOB: 1946-06-10   Cancelled Treatment:    Reason Eval/Treat Not Completed: Patient at procedure or test/unavailable (pt leaving for CTA)  Wyona Almas, PT, DPT Acute Rehabilitation Services Pager [phone] Office [phone]    Deno Etienne 02/10/2022, 8:15 AM

## 2022-02-11 DIAGNOSIS — N179 Acute kidney failure, unspecified: Secondary | ICD-10-CM | POA: Diagnosis present

## 2022-02-11 DIAGNOSIS — N189 Chronic kidney disease, unspecified: Secondary | ICD-10-CM | POA: Diagnosis present

## 2022-02-11 DIAGNOSIS — I480 Paroxysmal atrial fibrillation: Secondary | ICD-10-CM | POA: Diagnosis not present

## 2022-02-11 DIAGNOSIS — I5033 Acute on chronic diastolic (congestive) heart failure: Secondary | ICD-10-CM | POA: Diagnosis not present

## 2022-02-11 DIAGNOSIS — I2729 Other secondary pulmonary hypertension: Secondary | ICD-10-CM | POA: Diagnosis not present

## 2022-02-11 LAB — UPEP/UIFE/LIGHT CHAINS/TP, 24-HR UR
% BETA, Urine: 27.9 %
ALPHA 1 URINE: 10.4 %
Albumin, U: 27.7 %
Alpha 2, Urine: 15.5 %
Free Kappa Lt Chains,Ur: 7.42 mg/L (ref 1.17–86.46)
Free Kappa/Lambda Ratio: 1.23 — ABNORMAL LOW (ref 1.83–14.26)
Free Lambda Lt Chains,Ur: 6.03 mg/L (ref 0.27–15.21)
GAMMA GLOBULIN URINE: 18.5 %
Total Protein, Urine-Ur/day: 125 mg/24 hr (ref 30–150)
Total Protein, Urine: 4.3 mg/dL
Total Volume: 2900

## 2022-02-11 LAB — BASIC METABOLIC PANEL
Anion gap: 15 (ref 5–15)
BUN: 44 mg/dL — ABNORMAL HIGH (ref 8–23)
CO2: 26 mmol/L (ref 22–32)
Calcium: 9.4 mg/dL (ref 8.9–10.3)
Chloride: 102 mmol/L (ref 98–111)
Creatinine, Ser: 1.92 mg/dL — ABNORMAL HIGH (ref 0.44–1.00)
GFR, Estimated: 27 mL/min — ABNORMAL LOW (ref >60)
Glucose, Bld: 80 mg/dL (ref 70–99)
Potassium: 4.1 mmol/L (ref 3.5–5.1)
Sodium: 143 mmol/L (ref 135–145)

## 2022-02-11 LAB — MULTIPLE MYELOMA PANEL, SERUM
Albumin SerPl Elph-Mcnc: 2.6 g/dL — ABNORMAL LOW (ref 2.9–4.4)
Albumin/Glob SerPl: 0.9 (ref 0.7–1.7)
Alpha 1: 0.4 g/dL (ref 0.0–0.4)
Alpha2 Glob SerPl Elph-Mcnc: 1 g/dL (ref 0.4–1.0)
B-Globulin SerPl Elph-Mcnc: 1.2 g/dL (ref 0.7–1.3)
Gamma Glob SerPl Elph-Mcnc: 0.7 g/dL (ref 0.4–1.8)
Globulin, Total: 3.2 g/dL (ref 2.2–3.9)
IgA: 306 mg/dL (ref 64–422)
IgG (Immunoglobin G), Serum: 857 mg/dL (ref 586–1602)
IgM (Immunoglobulin M), Srm: 59 mg/dL (ref 26–217)
Total Protein ELP: 5.8 g/dL — ABNORMAL LOW (ref 6.0–8.5)

## 2022-02-11 LAB — MAGNESIUM: Magnesium: 2.4 mg/dL (ref 1.7–2.4)

## 2022-02-11 MED ORDER — POTASSIUM CHLORIDE CRYS ER 20 MEQ PO TBCR
20.0000 meq | EXTENDED_RELEASE_TABLET | Freq: Every day | ORAL | Status: DC
  Administered 2022-02-11 – 2022-02-12 (×2): 20 meq via ORAL
  Filled 2022-02-11 (×2): qty 1

## 2022-02-11 MED ORDER — TORSEMIDE 20 MG PO TABS
40.0000 mg | ORAL_TABLET | Freq: Every day | ORAL | Status: DC
  Administered 2022-02-11 – 2022-02-12 (×2): 40 mg via ORAL
  Filled 2022-02-11 (×2): qty 2

## 2022-02-11 NOTE — Progress Notes (Signed)
[facility] Gastroenterology Progress Note  [name] 76 y.o. [birth year]  CC: Nausea, vomiting, abdominal pain   Subjective: Patient seen and examined at bedside.  Abdominal pain has improved with improvement in constipation.  Last bowel movement this morning.  Continues to struggle with nausea and vomiting which she now relates to her vertigo.  ROS : Afebrile.  Positive for nausea and vomiting.   Objective: Vital signs in last 24 hours: Vitals:   02/10/22 2343 02/11/22 0405  BP: 112/61 130/66  Pulse:    Resp: 17 20  Temp:  98 F (36.7 C)  SpO2:  96%    Physical Exam:  General:  Alert, cooperative, no distress, appears stated age  Head:  Normocephalic, without obvious abnormality, atraumatic  Eyes:  , EOM's intact,   Lungs:   No visible respiratory distress  Heart:  Regular rate and rhythm, S1, S2 normal  Abdomen:   Soft, non-tender, minimally distended, bowel sounds present, no peritoneal signs  Extremities: Extremities normal, atraumatic, no  edema  Pulses: 2+ and symmetric    Lab Results: Recent Labs    02/10/22 0811 02/11/22 0301  NA 141 143  K 4.2 4.1  CL 100 102  CO2 29 26  GLUCOSE 92 80  BUN 45* 44*  CREATININE 2.39* 1.92*  CALCIUM 9.0 9.4  MG  --  2.4   Recent Labs    02/09/22 1030 02/10/22 0811  AST 13* 15  ALT 16 15  ALKPHOS 99 106  BILITOT 0.7 0.4  PROT 6.7 6.5  ALBUMIN 2.7* 2.6*   Recent Labs    02/09/22 1030 02/10/22 0811  WBC 6.4 8.5  NEUTROABS 4.2  --   HGB 10.4* 10.6*  HCT 31.7* 33.3*  MCV 101.3* 99.1  PLT 303 337   No results for input(s): LABPROT, INR in the last 72 hours.    Assessment/Plan: -Abdominal pain with CT scan showing constipation and high-grade urinary bladder distention.  Abdominal pain improved some with a bowel movement today. -Nausea.  Multifactorial.  Possible component of BPPV.  -CHF -Pulmonary hypertension   Recommendations ------------------------ -Currently on scopolamine  with minimal improvement  in nausea and vomiting. -Discussed with physical therapy for possible Epley maneuver for BPPV. - continue other supportive care -GI will follow   Otis Brace MD, [place] 02/11/2022, 10:05 AM  Contact #  [phone]

## 2022-02-11 NOTE — Progress Notes (Addendum)
Progress Note   Patient: [name] NAT:[id number] DOB: 01/23/1946 DOA: 02/03/2022     8 DOS: the patient was seen and examined on 02/11/2022   Brief hospital course: [name] is a 76 y.o. female with past medical history of pulmonary hypertension, CVA, chronic thromboembolic disease, and dyslipidemia who was admitted to the cardiology service on 02/03/22 with the diagnosis of acute on chronic diastolic heart failure. 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 She has been placed on diuretic therapy with slow response. Hospitalization complicated by acute kidney injury and orthostatic hypotension. She developed vertigo triggering significant nausea and vomiting .  Transferred to [facility] on 02/27 for further management.   Assessment and Plan: * Acute on chronic diastolic CHF (congestive heart failure) ([place])- (present on admission) Echocardiogram with LV EF 55 to 60% with preserved RV systolic function. No significant valvular disease.   Urine output over last 24 hrs is 1,200 ml. Continue to improve edema. Since admission negative fluid balance at - 5,626 ml.  Today transitioned to oral torsemide  Continue with midodrine for blood pressure support. Not able to start guideline directed therapy due to risk of hypotension and worsening renal failure.    Other secondary pulmonary hypertension ([place])- (present on admission) Continue supplemental 02 per [place] to keep 02 saturation 92% or greater. Echocardiogram with preserved RV systolic function.  Continue oxymetry monitoring.   Acute kidney injury superimposed on chronic kidney disease ([place])- (present on admission) CKD stage 3a/ hypokalemia  Peak cr at 2.39. she has responded well to diuresis, with improvement on renal function and electrolytes. Today renal function with serum cr 1.92 with K at 4,1 and serum bicarbonate at 26. Follow up renal function in am, avoid hypotension and nephrotoxic medications.  Continue K correction with KCl  Paroxysmal atrial  fibrillation ([place])- (present on admission) Heart rate has been controlled. Currently not on AV blockers, continue antiplatelet therapy with aspirin. HR has been in the 100 range.   Continue telemetry monitoring.   Essential hypertension- (present on admission) Continue with midodrine for blood pressure support. Systolic blood pressure 427 to 130 mmHg.    Vertigo Patient with positive vertigo that has triggered significant nausea and vomiting.  Work up with abdomen CT with no acute changes. Positive cholelithiasis. Positive urinary retention.  No bowel obstruction.  Her urinary retention has resolved.   Symptoms have improved with meclizine, continue with tid regime.  Continue with PT and OT.  Continue with antiacid therapy with pantoprazole and sucralfate.  Continue ropinarole for restless leg syndrome.   Continue with gabapentin to 100 mg daily (lower dose) and continue with duloxetine.    Hyperlipidemia- (present on admission) Continue with statin therapy.   Class 1 obesity- (present on admission) Calculated BMI is 30.5         Subjective: Patient with significant improvement in her symptoms with no nausea or vomiting, and improve po intake, vertigo has improved.   Physical Exam: Vitals:   02/10/22 2000 02/10/22 2343 02/11/22 0405 02/11/22 1125  BP: (!) 96/46 112/61 130/66 (!) 108/54  Pulse: (!) 112     Resp: 18 17 20 14   Temp: 98.6 F (37 C)  98 F (36.7 C)   TempSrc: Oral     SpO2: 95%  96%   Weight:   86.4 kg   Height:       Neurology awake and alert ENT with no pallor Cardiovascular with S1 and S2 with no gallops or rubs, positive systolic murmur at the apex.  No JVD  Trace lower extremity edema Respiratory with no rales or rhonchi, no wheezing Abdomen soft   Data Reviewed:    Family Communication: no family at the bedside, but her friend is at the bedside.   Disposition: Status is: Inpatient Remains inpatient appropriate because: pending  transfer to CIR      Planned Discharge Destination:  CIR    Author: Tawni Millers, MD 02/11/2022 3:13 PM  For on call review [email or web address].

## 2022-02-11 NOTE — Progress Notes (Signed)
Patient ID: [name], female   DOB: 01/30/1946, 76 y.o.   MRN: [id number]     Advanced Heart Failure Rounding Note  PCP-Cardiologist: Sinclair Grooms, MD   Subjective:    Developed severe nausea + abdominal pain over the weekend. KUB with moderate to large stool burden. US abdomen with gallstones but no evidence of cholecystitis. Heterogenous echogenicity of liver. CT abdomen/pelvis with cholelithiasis, urinary bladder distention, stool throughout colon consistent with constipation.   Some improvement with bowel movement yesterday and today, but still with nausea.  Relates vertigo symptoms that may be cause of nausea. She is on meclizine.   Scr 1.8>1.99>2.04>2.19>2.12>1.92.  Weight is actually down 4 lbs.   BP stable.   [place] 2/23:  RA mean 16 PA 55/27 PCWP mean 23 CI 3.7 PVR 2 WU  Objective:   Weight Range: 86.4 kg Body mass index is 29.83 kg/m.   Vital Signs:   Temp:  [98 F (36.7 C)-98.6 F (37 C)] 98 F (36.7 C) (02/28 0405) Pulse Rate:  [112] 112 (02/27 2000) Resp:  [14-20] 14 (02/28 1125) BP: (96-130)/(46-66) 108/54 (02/28 1125) SpO2:  [95 %-96 %] 96 % (02/28 0405) Weight:  [86.4 kg] 86.4 kg (02/28 0405) Last BM Date : 02/10/22  Weight change: Filed Weights   02/09/22 1904 02/10/22 0500 02/11/22 0405  Weight: 90 kg 88.2 kg 86.4 kg    Intake/Output:   Intake/Output Summary (Last 24 hours) at 02/11/2022 1259 Last data filed at 02/11/2022 1030 Gross per 24 hour  Intake 580 ml  Output 900 ml  Net -320 ml      Physical Exam    General: NAD Neck: JVP 8-9 cm, no thyromegaly or thyroid nodule.  Lungs: Clear to auscultation bilaterally with normal respiratory effort. CV: Nondisplaced PMI.  Heart regular S1/S2, no S3/S4, 2/6 SEM RUSB.  1+ ankle edema.  Abdomen: Soft, nontender, no hepatosplenomegaly, no distention.  Skin: Intact without lesions or rashes.  Neurologic: Alert and oriented x 3.  Psych: Normal affect. Extremities: No clubbing or cyanosis.   HEENT: Normal.    Telemetry   Sinus tachy 100s (personally reviewed)  Labs    CBC Recent Labs    02/09/22 1030 02/10/22 0811  WBC 6.4 8.5  NEUTROABS 4.2  --   HGB 10.4* 10.6*  HCT 31.7* 33.3*  MCV 101.3* 99.1  PLT 303 400   Basic Metabolic Panel Recent Labs    02/10/22 0811 02/11/22 0301  NA 141 143  K 4.2 4.1  CL 100 102  CO2 29 26  GLUCOSE 92 80  BUN 45* 44*  CREATININE 2.39* 1.92*  CALCIUM 9.0 9.4  MG  --  2.4   Liver Function Tests Recent Labs    02/09/22 1030 02/10/22 0811  AST 13* 15  ALT 16 15  ALKPHOS 99 106  BILITOT 0.7 0.4  PROT 6.7 6.5  ALBUMIN 2.7* 2.6*   Recent Labs    02/09/22 1030  LIPASE 26  AMYLASE 18*   Cardiac Enzymes No results for input(s): CKTOTAL, CKMB, CKMBINDEX, TROPONINI in the last 72 hours.  BNP: BNP (last 3 results) Recent Labs    02/03/22 1544  BNP 841.9*    ProBNP (last 3 results) No results for input(s): PROBNP in the last 8760 hours.   D-Dimer No results for input(s): DDIMER in the last 72 hours. Hemoglobin A1C No results for input(s): HGBA1C in the last 72 hours. Fasting Lipid Panel No results for input(s): CHOL, HDL, LDLCALC, TRIG, CHOLHDL, LDLDIRECT  in the last 72 hours.  Thyroid Function Tests No results for input(s): TSH, T4TOTAL, T3FREE, THYROIDAB in the last 72 hours.  Invalid input(s): FREET3  Other results:   Imaging    No results found.   Medications:     Scheduled Medications:  amphetamine-dextroamphetamine  10 mg Oral Q breakfast   aspirin  81 mg Oral QHS   calcium carbonate  1 tablet Oral Q breakfast   chlorhexidine  15 mL Mouth Rinse BID   DULoxetine  60 mg Oral BID   gabapentin  100 mg Oral TID   heparin  5,000 Units Subcutaneous Q8H   HYDROcodone-acetaminophen  1 tablet Oral QID   meclizine  12.5 mg Oral TID   mouth rinse  15 mL Mouth Rinse q12n4p   midodrine  10 mg Oral TID WC   pantoprazole  40 mg Oral BID   polyethylene glycol  17 g Oral BID   potassium  chloride  20 mEq Oral Daily   rOPINIRole  1 mg Oral Daily   And   rOPINIRole  2 mg Oral BID   scopolamine  1 patch Transdermal Q72H   senna  1 tablet Oral Daily   sodium chloride flush  3 mL Intravenous Q12H   sucralfate  1 g Oral TID WC & HS   torsemide  40 mg Oral Daily   triamcinolone ointment  1 application Topical BID   Vitamin D (Ergocalciferol)  50,000 Units Oral Q7 days    Infusions:  sodium chloride Stopped (02/08/22 0155)    PRN Medications: sodium chloride, acetaminophen, albuterol, benzocaine, lidocaine (PF), ondansetron (ZOFRAN) IV, sodium chloride flush    Patient Profile   76 y/o Female w/ chronic diastolic heart failure, h/o orthostatic hypotension with multiple falls, exertional dyspnea and severe peripheral neuropathy of uncertain etiology, admitted for acute on chronic diastolic heart failure and AKI. RHC showed elevated filling pressures, normal CO. Being evaluated for amyloid.   Assessment/Plan   1. Acute on chronic diastolic CHF: Echo from this admission shows EF 55-60%, mild LVH, grade II diastolic dysfunction, normal RV size and systolic function, mild aortic stenosis with mild-moderate aortic insufficiency, mild-moderate MR. No significant CAD on remote cath but had anomalous RCA.  Cardiolite this admission showed EF 50%, no ischemia/infarction. PYP scan was not suggestive of TTR cardiac amyloidosis.  cMRI was somewhat equivocal => LV walls not thickened and there was delayed enhancement in the basal inferior wall in a subepicardial pattern more concerning for myocarditis than amyloidosis.  However, the extracellular volume percentage was 49%, which is suggestive of amyloidosis.  SPEP negative.  Creatinine down to 1.93 off IV Lasix, looks mildly volume overloaded.  - She has severe peripheral neuropathy of uncertain etiology, orthostatic hypotension likely from autonomic neuropathy, and bilateral carpal tunnel syndrome.  This + diastolic CHF tends to point  towards cardiac amyloidosis.  However, her echo is not very impressive for amyloidosis (minimal LVH), the cMRI is equivocal for amyloidosis (not classic LGE pattern but ECV percentage high), and the PYP scan does not look suggestive to me.  SPEP negative, pending urine immunofixation.  I will aim to repeat PYP scan in 4-5 months for progression, will send for genetic testing for hereditary transthyretin amyloidosis as outpatient as well.  I do not think we have enough evidence to treat.  - Patient is volume overloaded on exam and by RHC but has had rise in creatinine from baseline 1.7 to 1.99 with attempts at diuresis (also in setting of contrast load  and Bactrim use).   - Start torsemide 40 mg daily and KCl 20 daily.  - continue midodrine for BP support  - SGLT2 inhibitor in future if renal function remains stable.  2. AKI on CKD stage 3: Prior creatinine 1.4.  At admission, she was 1.74. Normal CO on RHC. Contrast nephropathy may play a role (had PE CTA at admission).  Also consider Bactrim.  She developed AKI with creatinine up to 2.39 with diuresis, now back down 1.92.  - Avoid contrast.  - Bactrim now discontinued - avoid hypotension. Continue midodrine to support BP  - As above, follow creatinine closely with restarting torsemide.  3. Skin: Concern skin infection, primarily lower legs.  Culture from leg lesions at outside dermatologist positive for S. Aureus + Pseudomonas. Seen by ID, no active cellulitis apparent, antibiotics stopped.   4. Orthostatic hypotension: In setting of volume overload.  Suspect autonomic neuropathy. Improving.  - Wear compression stockings.  - Wear abdominal binder.  - Continue midodrine 10 mg tid.  5. H/o CVA: Cryptogenic.  6. Aortic valve disorder: Mild AS, mild-moderate AI on 2/23 echo.  7. Elevated troponin: Mild elevation with no trend.  No chest pain currently.  Cardiolite with no ischemia.  Suspect demand ischemia from volume overload.  8. Nausea/abdominal  pain: Large stool burden on KUB. Gall stones on US abdomen but no evidence of cholecystitis. AF. No leukocytosis. Lactic acid 0.9 yesterday. Lipase WNL, amylase 18. LFTs not elevated. CT abdomen/pelvis with cholelithiasis, urinary bladder distention, stool throughout colon consistent with constipation. It is possible that this was due to constipation as well as vertigo/BPPV.  Appreciate GI input.  Feels better with 2 BMs but still with nausea.  - Continue treatment of BPPV, meclizine and scopolamine.   Mobilize.  From a cardiac standpoint, if renal function remains stable after starting torsemide, she can go to CIR.    Length of Stay: [place], MD  02/11/2022, 12:59 PM  Advanced Heart Failure Team Pager [phone] (M-F; 7a - 5p)  Please contact [facility] for night-coverage after hours (5p -7a ) and weekends on [email or web address]

## 2022-02-11 NOTE — Progress Notes (Signed)
Mobility Specialist Progress Note:   02/11/22 1330  Mobility  Activity Transferred from chair to bed  Level of Assistance Moderate assist, patient does 50-74%  Assistive Device None  RUE Weight Bearing NWB  Distance Ambulated (ft) 2 ft  Activity Response Tolerated fair  $Mobility charge 1 Mobility   Pt requesting to get back to bed. Requiring modA throughout transfer to stand and to take steps to EOB. Pt displaying increased fatigue this afternoon. No shaky or jerky movements during mobility, pt back in bed with all needs met.   Nelta Numbers Acute Rehab Phone: [phone] Office Phone: [phone]

## 2022-02-11 NOTE — Care Management Important Message (Signed)
Important Message  Patient Details  Name: [name] MRN: [id number] Date of Birth: [birth year]   Medicare Important Message Given:  Yes     [name] 02/11/2022, 8:04 AM

## 2022-02-11 NOTE — Progress Notes (Signed)
Mobility Specialist Progress Note:   02/11/22 1200  Mobility  Activity Transferred from bed to chair  Level of Assistance Minimal assist, patient does 75% or more  Assistive Device None  RUE Weight Bearing NWB  Distance Ambulated (ft) 2 ft  Activity Response Tolerated fair  $Mobility charge 1 Mobility   Pt eager for mobility session. Feeling significantly better than yesterday, still with minor nausea. Pt required minA +1 to stand from EOB and to transfer to chair. Left in chair with all needs met, visitor present.   Nelta Numbers Acute Rehab Phone: [phone] Office Phone: [phone]

## 2022-02-11 NOTE — Hospital Course (Signed)
[name] is a 76 y.o. female with past medical history of pulmonary hypertension, CVA, chronic thromboembolic disease, and dyslipidemia who was admitted to the cardiology service on 02/03/22 with the diagnosis of acute on chronic diastolic heart failure. 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 She has been placed on diuretic therapy with slow response. Hospitalization complicated by acute kidney injury and orthostatic hypotension. She developed vertigo triggering significant nausea and vomiting .  Transferred to [facility] on 02/27 for further management.

## 2022-02-11 NOTE — Progress Notes (Addendum)
Inpatient Rehab Admissions Coordinator:   I spoke with Pt. And friend regarding potential CIR admit. She is interested and states she will have at least supervision at all times and a friend may be able to stay and assist her up to mod assist for a few weeks if needed.  Per MD, Pt. Not yet medically ready for d/c but I will follow for potential admit pending medical readiness and bed availability.   Clemens Catholic, [place], [place] Admissions Coordinator  [phone] ([place]) [phone] (office)

## 2022-02-11 NOTE — Assessment & Plan Note (Addendum)
CKD stage 3a/ hypokalemia  Peak cr at 2.39. she has responded well to diuresis, with improvement on renal function and electrolytes. Today renal function with serum cr 1.92 with K at 4,1 and serum bicarbonate at 26. Follow up renal function in am, avoid hypotension and nephrotoxic medications.  Continue K correction with KCl

## 2022-02-12 ENCOUNTER — Other Ambulatory Visit (HOSPITAL_COMMUNITY): Payer: Self-pay

## 2022-02-12 DIAGNOSIS — I5033 Acute on chronic diastolic (congestive) heart failure: Secondary | ICD-10-CM | POA: Diagnosis not present

## 2022-02-12 LAB — BASIC METABOLIC PANEL
Anion gap: 12 (ref 5–15)
BUN: 48 mg/dL — ABNORMAL HIGH (ref 8–23)
CO2: 31 mmol/L (ref 22–32)
Calcium: 9.4 mg/dL (ref 8.9–10.3)
Chloride: 97 mmol/L — ABNORMAL LOW (ref 98–111)
Creatinine, Ser: 2.18 mg/dL — ABNORMAL HIGH (ref 0.44–1.00)
GFR, Estimated: 23 mL/min — ABNORMAL LOW (ref >60)
Glucose, Bld: 100 mg/dL — ABNORMAL HIGH (ref 70–99)
Potassium: 4.1 mmol/L (ref 3.5–5.1)
Sodium: 140 mmol/L (ref 135–145)

## 2022-02-12 LAB — URINE CULTURE: Culture: >=100000 — AB

## 2022-02-12 MED ORDER — BISOPROLOL FUMARATE 5 MG PO TABS
2.5000 mg | ORAL_TABLET | Freq: Every day | ORAL | Status: DC
Start: 2022-02-12 — End: 2022-02-15
  Administered 2022-02-12 – 2022-02-15 (×4): 2.5 mg via ORAL
  Filled 2022-02-12 (×4): qty 1

## 2022-02-12 NOTE — Progress Notes (Signed)
Physical Therapy Treatment ?Patient Details ?Name: [name] ?MRN: [id number] ?DOB: 1946-07-09 ?Today's Date: 02/12/2022 ? ? ?History of Present Illness Patient is a 76 yo female admitted on 02/03/22 with chest pain and DOE. Patient also with cellulitis, staph aureus and pseudomonas in BLEs. R heart cath 02/06/22. Also with R wrist fx and L D5 fx in 1/23.  PMH of CAD with anomalous origin of RCA, cryptogenic CVA s/p LINQ with no atrial fibrillation, carotid stenosis, sleep apnea, secondary pulmonary hypertension (V/Q scan negative for chronic thromboembolic disease), hyperlipidemia, restless leg syndrome, peripheral neuropathy and GERD ? ?  ?PT Comments  ? ? Patient with great difficulty standing from recliner despite trial with R PFRW.  Flexed posture and posterior bias limited ability to take steps well with walker, but better with squat pivot using bedrail on L side.  She was already somewhat hypotensive sitting in recliner, but used binder and seemed less orthostatic though no true standing pressures taken this session.  Patient had negative hallpike testing for anterior/posterior canal BPPV and noted no nystagmus when lying on L side.  She has symptoms so will continue testing and progress mobility as tolerated.  Currently remains appropriate for acute inpatient rehab at d/c.  ? ? Vestibular Assessment - 02/12/22 0001   ? ?  ? Symptom Behavior  ? Subjective history of current problem reports history of positional vertigo with treatment about 5 years ago; today having some nausea with light headedness and RN in to deliver nausea meds   ? Type of Dizziness  Lightheadedness;Spinning   ? Frequency of Dizziness intermittent   ? Duration of Dizziness minutes   ? Symptom Nature Motion provoked;Variable;Intermittent;Positional   ? Aggravating Factors Activity in general;Turning head quickly;Supine to sit   ? Relieving Factors Rest;Slow movements;Closing eyes   ? Progression of Symptoms Better   ?  ? Oculomotor Exam  ?  Oculomotor Alignment Normal   ? Ocular ROM WNL   ? Spontaneous Absent   ? Gaze-induced  Absent   ? Smooth Pursuits Saccades   ? Saccades Slow   ?  ? Auditory  ? Comments intact to scratch test and equal bilateral   ?  ? Positional Testing  ? Dix-Hallpike Dix-Hallpike Right;Dix-Hallpike Left   ?  ? Dix-Hallpike Right  ? Dix-Hallpike Right Duration 45 sec   ? Dix-Hallpike Right Symptoms No nystagmus   but feels some spinning  ?  ? Dix-Hallpike Left  ? Dix-Hallpike Left Duration 45 sec   ? Dix-Hallpike Left Symptoms No nystagmus   ? ?  ?  ? ?  ?   ?Recommendations for follow up therapy are one component of a multi-disciplinary discharge planning process, led by the attending physician.  Recommendations may be updated based on patient status, additional functional criteria and insurance authorization. ? ?Follow Up Recommendations ? Acute inpatient rehab (3hours/day) ?  ?  ?Assistance Recommended at Discharge Frequent or constant Supervision/Assistance  ?Patient can return home with the following A lot of help with walking and/or transfers;A lot of help with bathing/dressing/bathroom;Direct supervision/assist for medications management;Assist for transportation ?  ?Equipment Recommendations ? BSC/3in1;Wheelchair (measurements PT);Wheelchair cushion (measurements PT)  ?  ?Recommendations for Other Services   ? ? ?  ?Precautions / Restrictions Precautions ?Precautions: Fall ?Precaution Comments: myoclonic jerking ?Restrictions ?Weight Bearing Restrictions: Yes ?RUE Weight Bearing: Weight bear through elbow only ?Other Position/Activity Restrictions: R wrist fx and L 5th finger fx, contacting ortho for WB status  ?  ? ?Mobility ? Bed Mobility ?Overal  bed mobility: Needs Assistance ?Bed Mobility: Sit to Sidelying ?  ?  ?  ?  ?Sit to sidelying: Mod assist ?General bed mobility comments: assist for legs into bed ?  ? ?Transfers ?Overall transfer level: Needs assistance ?Equipment used: Right platform walker ?Transfers: Bed to  chair/wheelchair/BSC, Sit to/from Stand ?Sit to Stand: Max assist ?Stand pivot transfers: Mod assist, +2 safety/equipment ?  ?Squat pivot transfers: Mod assist ?  ?  ?General transfer comment: up with R PFRW taking steps with A for balance, managing walker, keeping R arm on platform, etc, increased time to [facility]; then to bed using bed rail to stand and pivot min to mod A with more anterior weight shift and rail on L side ?  ? ?Ambulation/Gait ?  ?  ?  ?  ?  ?  ?  ?  ? ? ?Stairs ?  ?  ?  ?  ?  ? ? ?Wheelchair Mobility ?  ? ?Modified Rankin (Stroke Patients Only) ?  ? ? ?  ?Balance Overall balance assessment: Needs assistance ?  ?Sitting balance-Leahy Scale: Poor ?Sitting balance - Comments: sitting on wide BSC leaning to R and posterior with pillows at back and on R aide ?Postural control: Right lateral lean, Posterior lean ?  ?Standing balance-Leahy Scale: Poor ?Standing balance comment: Reliant on BUE external support and mod A for balance ?  ?  ?  ?  ?  ?  ?  ?  ?  ?  ?  ?  ? ?  ?Cognition Arousal/Alertness: Awake/alert ?Behavior During Therapy: Flat affect ?Overall Cognitive Status: Impaired/Different from baseline ?Area of Impairment: Attention, Following commands, Safety/judgement, Problem solving, Memory ?  ?  ?  ?  ?  ?  ?  ?  ?  ?Current Attention Level: Sustained ?Memory: Decreased short-term memory ?Following Commands: Follows one step commands with increased time, Follows one step commands inconsistently ?Safety/Judgement: Decreased awareness of deficits ?  ?Problem Solving: Slow processing, Difficulty sequencing, Requires verbal cues ?  ?  ?  ? ?  ?Exercises General Exercises - Lower Extremity ?Long Arc Quad: AROM, Both, 5 reps, Seated ? ?  ?General Comments General comments (skin integrity, edema, etc.): BP low in sitting in 09'G systolic, after up to [facility] with binder and knee hi TEDS SBP 120s; after squat pivot after toileting SBP 106 ?  ?  ? ?Pertinent Vitals/Pain Pain Assessment ?Pain Assessment:  Faces ?Faces Pain Scale: Hurts little more ?Pain Location: L fingers ?Pain Descriptors / Indicators: Aching, Tender ?Pain Intervention(s): Monitored during session, Repositioned  ? ? ?Home Living   ?  ?  ?  ?  ?  ?  ?  ?  ?  ?   ?  ?Prior Function    ?  ?  ?   ? ?PT Goals (current goals can now be found in the care plan section) Progress towards PT goals: Progressing toward goals ? ?  ?Frequency ? ? ? Min 3X/week ? ? ? ?  ?PT Plan Current plan remains appropriate  ? ? ?Co-evaluation   ?  ?  ?  ?  ? ?  ?AM-PAC PT "6 Clicks" Mobility   ?Outcome Measure ? Help needed turning from your back to your side while in a flat bed without using bedrails?: A Little ?Help needed moving from lying on your back to sitting on the side of a flat bed without using bedrails?: A Lot ?Help needed moving to and from a bed to a chair (including  a wheelchair)?: Total ?Help needed standing up from a chair using your arms (e.g., wheelchair or bedside chair)?: Total ?Help needed to walk in hospital room?: Total ?Help needed climbing 3-5 steps with a railing? : Total ?6 Click Score: 9 ? ?  ?End of Session Equipment Utilized During Treatment: Gait belt;Oxygen ?Activity Tolerance: Patient limited by fatigue ?Patient left: in bed;with call bell/phone within reach;with bed alarm set ?  ?PT Visit Diagnosis: Unsteadiness on feet (R26.81);Other abnormalities of gait and mobility (R26.89);Muscle weakness (generalized) (M62.81);History of falling (Z91.81);Difficulty in walking, not elsewhere classified (R26.2) ?  ? ? ?Time: 5465-0354 ?PT Time Calculation (min) (ACUTE ONLY): 44 min ? ?Charges:  $Therapeutic Activity: 38-52 mins          ?          ? ?Magda Kiel, PT ?Acute Rehabilitation Services ?SFKCL:[phone] ?[phone] ?02/12/2022 ? ? ? ?Reginia Naas ?02/12/2022, 5:20 PM ? ?

## 2022-02-12 NOTE — TOC Progression Note (Signed)
Transition of Care (TOC) - Progression Note  ? ? ?Patient Details  ?Name: [name] ?MRN: [id number] ?Date of Birth: 01/03/1946 ? ?Transition of Care (TOC) CM/SW Contact  ?Zenon Mayo, RN ?Phone Number: ?02/12/2022, 1:31 PM ? ?Clinical Narrative:    ?Patient is active with [facility] for [facility] services, but CIR is also looking at for admit to CIR. TOC will continue to follow for dc needs. ? ? ?Expected Discharge Plan: [place] ?Barriers to Discharge: Continued Medical Work up ? ?Expected Discharge Plan and Services ?Expected Discharge Plan: [place] ?  ?Discharge Planning Services: CM Consult ?Post Acute Care Choice: Home Health ?Living arrangements for the past 2 months: [place] ?                ?  ?  ?  ?  ?  ?HH Arranged: Therapist, sports, PT ?[place] Agency: [place] ([place]) ?Date HH Agency Contacted: 02/07/22 ?Time [place]: 8828 ?Representative spoke with at [place]: Corene Cornea ? ? ?Social Determinants of Health (SDOH) Interventions ?  ? ?Readmission Risk Interventions ?No flowsheet data found. ? ?

## 2022-02-12 NOTE — Progress Notes (Signed)
Spoke with PA for Dr. Milly Jakob at [facility] by phone. Pt was due for f/u appointment last week; initial visit 3 weeks ago.  ? ?LUE: WBAT.  ? ?RUE:WB through R elbow only, use of R platform walker for transfers/mobility. ? ?Tyrone Schimke, OT ?Acute Rehabilitation Services ?Office: [phone] ? ?

## 2022-02-12 NOTE — Progress Notes (Signed)
Mobility Specialist Progress Note: ? ? 02/12/22 1215  ?Pain Assessment  ?Pain Location RUE  ?Pain Intervention(s) Other (comment) ?(NWB)  ?Mobility  ?Activity Transferred from bed to chair  ?Level of Assistance Moderate assist, patient does 50-74%  ?Assistive Device None  ?RUE Weight Bearing NWB  ?Distance Ambulated (ft) 2 ft  ?Activity Response Tolerated fair  ?$Mobility charge 1 Mobility  ? ?Pt agreeable to mobility session today. Required modA for bed mobility, to stand, and transfer to chair. Pt requiring max cues to pick up L foot, and to maintain good posture. Pt states she feels very weak today, however eager to gain strength. Left pt up in chair eating lunch.  ? ?Nelta Numbers ?Acute Rehab ?Phone: [phone] ?Office Phone: [phone] ? ?

## 2022-02-12 NOTE — Progress Notes (Signed)
[facility] Progress Note ? ?Hallel Denherder Cacioppo 76 y.o. Apr 21, 1946 ? ?CC: Nausea, vomiting, abdominal pain ? ? ?Subjective: ?Patient seen and examined at bedside.  Feeling better today.  Tolerating diet.  Denies any further vomiting.  Minimal nausea. ? ?ROS : Afebrile.  Positive for nausea  ? ? ?Objective: ?Vital signs in last 24 hours: ?Vitals:  ? 02/11/22 1905 02/12/22 0335  ?BP: 134/63 129/68  ?Pulse: (!) 104 (!) 107  ?Resp:  17  ?Temp: 98.3 ?F (36.8 ?C) 97.8 ?F (36.6 ?C)  ?SpO2: 98%   ? ? ?Physical Exam: ? ?General:  Alert, cooperative, no distress, appears stated age  ?Head:  Normocephalic, without obvious abnormality, atraumatic  ?Eyes:  , EOM's intact,   ?Lungs:   No visible respiratory distress  ?Heart:  Regular rate and rhythm, S1, S2 normal  ?Abdomen:   Soft, non-tender, minimally distended, bowel sounds present, no peritoneal signs  ?Extremities: Extremities normal, atraumatic, no  edema  ?Pulses: 2+ and symmetric  ? ? ?Lab Results: ?Recent Labs  ?  02/11/22 ?0301 02/12/22 ?6644  ?NA 143 140  ?K 4.1 4.1  ?CL 102 97*  ?CO2 26 31  ?GLUCOSE 80 100*  ?BUN 44* 48*  ?CREATININE 1.92* 2.18*  ?CALCIUM 9.4 9.4  ?MG 2.4  --   ? ?Recent Labs  ?  02/09/22 ?1030 02/10/22 ?0811  ?AST 13* 15  ?ALT 16 15  ?ALKPHOS 99 106  ?BILITOT 0.7 0.4  ?PROT 6.7 6.5  ?ALBUMIN 2.7* 2.6*  ? ?Recent Labs  ?  02/09/22 ?1030 02/10/22 ?0811  ?WBC 6.4 8.5  ?NEUTROABS 4.2  --   ?HGB 10.4* 10.6*  ?HCT 31.7* 33.3*  ?MCV 101.3* 99.1  ?PLT 303 337  ? ?No results for input(s): LABPROT, INR in the last 72 hours. ? ? ? ?Assessment/Plan: ?-Abdominal pain with CT scan showing constipation and high-grade urinary bladder distention.  Abdominal pain improved some with a bowel movement today. ?-Nausea.  Multifactorial.  Possible component of BPPV.  ?-CHF ?-Pulmonary hypertension ?  ?Recommendations ?------------------------ ?-Patient is tolerating diet now.  Having bowel movements. ?-No further inpatient GI work-up planned. ?-We will sign off.  Call  us back if needed. ? ? ?Otis Brace MD, FACP ?02/12/2022, 9:38 AM ? ?Contact #  [phone]  ?

## 2022-02-12 NOTE — Progress Notes (Signed)
Inpatient Rehab Admissions Coordinator:  ? ?Pt. Is not yet medically stable for CIR, but I will follow for potential admit pending insurance auth and bed availability. ? ?Clemens Catholic, MS, CCC-SLP ?Rehab Admissions Coordinator  ?[phone] ([place]) ?[phone] (office) ? ?

## 2022-02-12 NOTE — Progress Notes (Signed)
Orthopedic Tech Progress Note ?Patient Details:  ?[name] ?1945-12-29 ?[id number] ? ?Ortho Devices ?Type of Ortho Device: Sling immobilizer ?Ortho Device/Splint Location: RUE ?Ortho Device/Splint Interventions: Ordered ?  ?  ?Pt currently eating lunch and had spilled it all over herself. I alerted the NT, so the pt could get a new gown and told her the sling was in the room, and if the RN had trouble applying it they could call me back. ?[name] ?02/12/2022, 2:53 PM ? ?

## 2022-02-12 NOTE — Progress Notes (Addendum)
Occupational Therapy Treatment ?Patient Details ?Name: [name] ?MRN: [id number] ?DOB: [birth year] ?Today's Date: 02/12/2022 ? ? ?History of present illness Patient is a 76 yo female admitted on 02/03/22 with chest pain and DOE. Patient also with cellulitis, staph aureus and pseudomonas in BLEs. R heart cath 02/06/22. Also with R wrist fx and L D5 fx in 1/23.  PMH of CAD with anomalous origin of RCA, cryptogenic CVA s/p LINQ with no atrial fibrillation, carotid stenosis, sleep apnea, secondary pulmonary hypertension (V/Q scan negative for chronic thromboembolic disease), hyperlipidemia, restless leg syndrome, peripheral neuropathy and GERD ?  ?OT comments ? Pt progressing towards acute OT goals. Focus of session was functional transfers. Pt limited by dizziness and lightheadedness in standing that worsened with time.  ? ?BP sitting: 103/63 ?BP immediately after standing:114/47 (could not get a read during active standing) ? ?Pt remains to require up to +2 assist with functional transfers. D/c recommendation remains appropriate.   ? ?Recommendations for follow up therapy are one component of a multi-disciplinary discharge planning process, led by the attending physician.  Recommendations may be updated based on patient status, additional functional criteria and insurance authorization. ?   ?Follow Up Recommendations ? Acute inpatient rehab (3hours/day)  ?  ?Assistance Recommended at Discharge Frequent or constant Supervision/Assistance  ?Patient can return home with the following ? Two people to help with walking and/or transfers;A lot of help with bathing/dressing/bathroom;Assistance with cooking/housework;Direct supervision/assist for medications management;Direct supervision/assist for financial management;Assist for transportation ?  ?Equipment Recommendations ? Other (comment) (defer to next venue)  ?  ?Recommendations for Other Services   ? ?  ?Precautions / Restrictions Precautions ?Precautions: Fall;Other  (comment) ?Precaution Comments: myoclonic jerking ?Restrictions ?Weight Bearing Restrictions: Yes ?RUE Weight Bearing: WB through elbow only, R platform walker for mobility ?Other Position/Activity Restrictions: R wrist fx and L 5th finger fx, contacting ortho for WB status  ? ? ?  ? ?Mobility Bed Mobility ?  ?  ?  ?  ?  ?  ?  ?General bed mobility comments: up in recliner ?  ? ?Transfers ?Overall transfer level: Needs assistance ?Equipment used: 2 person hand held assist ?Transfers: Sit to/from Stand ?Sit to Stand: Mod assist, +2 physical assistance, +2 safety/equipment ?  ?  ?  ?  ?  ?General transfer comment: posterior lean. sequencing cues. steadying assist. assist to power up and control descent ?  ?  ?Balance Overall balance assessment: Needs assistance ?Sitting-balance support: Bilateral upper extremity supported, Feet supported ?Sitting balance-Leahy Scale: Poor ?  ?Postural control: Posterior lean ?Standing balance support: Bilateral upper extremity supported, During functional activity ?Standing balance-Leahy Scale: Poor ?Standing balance comment: Reliant on BUE external support ?  ?  ?  ?  ?  ?  ?  ?  ?  ?  ?  ?   ? ?ADL either performed or assessed with clinical judgement  ? ?ADL Overall ADL's : Needs assistance/impaired ?  ?  ?  ?  ?  ?  ?  ?  ?  ?  ?Lower Body Dressing: Moderate assistance;+2 for physical assistance;Sit to/from stand ?  ?  ?  ?  ?  ?  ?  ?  ?General ADL Comments: decreased activity tolerance. Dizziness and lightheadedness in standing limiting session. ?  ? ?Extremity/Trunk Assessment Upper Extremity Assessment ?Upper Extremity Assessment: Generalized weakness;RUE deficits/detail;LUE deficits/detail ?RUE Deficits / Details: R wrist fx with cast, significant difficulty completing mobility from shoulder, no WB status specified ?RUE: Unable to fully assess due  to immobilization ?RUE Sensation: history of peripheral neuropathy ?RUE Coordination: decreased fine motor;decreased gross  motor ?LUE Deficits / Details: L D5 fx, unable to spontaneously grasp, but with provided stimuli can grasp around, D5 is flexed at DIP and patient is unable to straighten ?LUE: Unable to fully assess due to immobilization ?LUE Sensation: history of peripheral neuropathy ?LUE Coordination: decreased gross motor;decreased fine motor ?  ?Lower Extremity Assessment ?Lower Extremity Assessment: Defer to PT evaluation ?  ?  ?  ? ?Vision   ?  ?  ?Perception   ?  ?Praxis   ?  ? ?Cognition Arousal/Alertness: Awake/alert ?Behavior During Therapy: Flat affect ?Overall Cognitive Status: Impaired/Different from baseline ?Area of Impairment: Attention, Memory, Following commands, Safety/judgement, Awareness, Problem solving ?  ?  ?  ?  ?  ?  ?  ?  ?  ?Current Attention Level: Sustained ?Memory: Decreased recall of precautions, Decreased short-term memory ?Following Commands: Follows one step commands inconsistently, Follows multi-step commands inconsistently, Follows one step commands with increased time ?Safety/Judgement: Decreased awareness of safety, Decreased awareness of deficits ?Awareness: Emergent ?Problem Solving: Slow processing, Decreased initiation, Difficulty sequencing, Requires verbal cues, Requires tactile cues ?General Comments: cues for sequencing. difficulty dual-tasking. ?  ?  ?   ?Exercises   ? ?  ?Shoulder Instructions   ? ? ?  ?General Comments    ? ? ?Pertinent Vitals/ Pain       Pain Assessment ?Pain Assessment: Faces ?Faces Pain Scale: Hurts a little bit ?Pain Location: RUE ?Pain Descriptors / Indicators: Grimacing, Guarding, Discomfort ?Pain Intervention(s): Monitored during session ? ?Home Living   ?  ?  ?  ?  ?  ?  ?  ?  ?  ?  ?  ?  ?  ?  ?  ?  ?  ?  ? ?  ?Prior Functioning/Environment    ?  ?  ?  ?   ? ?Frequency ? Min 2X/week  ? ? ? ? ?  ?Progress Toward Goals ? ?OT Goals(current goals can now be found in the care plan section) ? Progress towards OT goals: Progressing toward goals ? ?Acute Rehab OT  Goals ?Patient Stated Goal: to get this figured out ?OT Goal Formulation: With patient/family ?Time For Goal Achievement: 02/24/22 ?Potential to Achieve Goals: Fair ?ADL Goals ?Pt Will Perform Grooming: standing;with set-up ?Pt Will Perform Lower Body Dressing: with min assist;sit to/from stand ?Pt Will Transfer to Toilet: ambulating;with supervision ?Pt/caregiver will Perform Home Exercise Program: Increased ROM;Both right and left upper extremity;With written HEP provided;With Supervision ?Additional ADL Goal #1: Patient will complete 3 step trail making task without verbal cues for recall to promote increased independence for safe discharge home.  ?Plan Discharge plan remains appropriate   ? ?Co-evaluation ? ? ?   ?  ?  ?  ?  ? ?  ?AM-PAC OT "6 Clicks" Daily Activity     ?Outcome Measure ? ? Help from another person eating meals?: A Little ?Help from another person taking care of personal grooming?: A Little ?Help from another person toileting, which includes using toliet, bedpan, or urinal?: Total ?Help from another person bathing (including washing, rinsing, drying)?: A Lot ?Help from another person to put on and taking off regular upper body clothing?: A Little ?Help from another person to put on and taking off regular lower body clothing?: A Lot ?6 Click Score: 14 ? ?  ?End of Session Equipment Utilized During Treatment: Gait belt ? ?OT Visit Diagnosis: Unsteadiness on  feet (R26.81);Other abnormalities of gait and mobility (R26.89);Repeated falls (R29.6);Muscle weakness (generalized) (M62.81);History of falling (Z91.81);Ataxia, unspecified (R27.0);Other symptoms and signs involving the nervous system (R29.898);Other symptoms and signs involving cognitive function ?  ?Activity Tolerance Patient limited by fatigue ?  ?Patient Left in chair;with call bell/phone within reach;with family/visitor present ?  ?Nurse Communication Mobility status ?  ? ?   ? ?Time: 0979-4997 ?OT Time Calculation (min): 22  min ? ?Charges: OT General Charges ?$OT Visit: 1 Visit ?OT Treatments ?$Self Care/Home Management : 8-22 mins ? ?Tyrone Schimke, OT ?Acute Rehabilitation Services ?Office: [phone] ? ? ?Tyrone Schimke H ?02/12/2022, 2:38 PM

## 2022-02-12 NOTE — Progress Notes (Addendum)
Patient ID: [name], female   DOB: February 14, 1946, 76 y.o.   MRN: [id number]     Advanced Heart Failure Rounding Note  PCP-Cardiologist: Sinclair Grooms, MD   Subjective:    Developed severe nausea + abdominal pain over the weekend. KUB with moderate to large stool burden. US abdomen with gallstones but no evidence of cholecystitis. Heterogenous echogenicity of liver. CT abdomen/pelvis with cholelithiasis, urinary bladder distention, stool throughout colon consistent with constipation.   Developed tachycardia this morning with rate in the 150s back down to 110s.   Concerned this may be A fib. Associated with dizziness. ECG showed sinus tachycardia.    Objective:   Weight Range: 84.6 kg Body mass index is 29.21 kg/m.   Vital Signs:   Temp:  [97.8 F (36.6 C)-98.3 F (36.8 C)] 97.8 F (36.6 C) (03/01 0335) Pulse Rate:  [104-107] 107 (03/01 0335) Resp:  [14-17] 17 (03/01 0335) BP: (108-134)/(54-68) 129/68 (03/01 0335) SpO2:  [98 %] 98 % (02/28 1905) Weight:  [84.6 kg] 84.6 kg (03/01 0335) Last BM Date : 02/11/22  Weight change: Filed Weights   02/10/22 0500 02/11/22 0405 02/12/22 0335  Weight: 88.2 kg 86.4 kg 84.6 kg    Intake/Output:   Intake/Output Summary (Last 24 hours) at 02/12/2022 0845 Last data filed at 02/12/2022 0600 Gross per 24 hour  Intake 600 ml  Output 1300 ml  Net -700 ml      Physical Exam   General:  Sitting on the side of the bed.  HEENT: normal Neck: supple. no JVD. Carotids 2+ bilat; no bruits. No lymphadenopathy or thryomegaly appreciated. Cor: PMI nondisplaced. Tachy Irregular rate & rhythm. No rubs, gallops . 2/6 SEM RUSB. . Lungs: clear Abdomen: soft, nontender, nondistended. No hepatosplenomegaly. No bruits or masses. Good bowel sounds. Extremities: no cyanosis, clubbing, rash, R and LLE unna boots.  Neuro: alert & orientedx3, cranial nerves grossly intact. moves all 4 extremities w/o difficulty. Affect flat      Telemetry   Sinus  Tach verus A fib? Rate up to 150s around 8:00. Back down to 110s.   Labs    CBC Recent Labs    02/09/22 1030 02/10/22 0811  WBC 6.4 8.5  NEUTROABS 4.2  --   HGB 10.4* 10.6*  HCT 31.7* 33.3*  MCV 101.3* 99.1  PLT 303 992   Basic Metabolic Panel Recent Labs    02/11/22 0301 02/12/22 0351  NA 143 140  K 4.1 4.1  CL 102 97*  CO2 26 31  GLUCOSE 80 100*  BUN 44* 48*  CREATININE 1.92* 2.18*  CALCIUM 9.4 9.4  MG 2.4  --    Liver Function Tests Recent Labs    02/09/22 1030 02/10/22 0811  AST 13* 15  ALT 16 15  ALKPHOS 99 106  BILITOT 0.7 0.4  PROT 6.7 6.5  ALBUMIN 2.7* 2.6*   Recent Labs    02/09/22 1030  LIPASE 26  AMYLASE 18*   Cardiac Enzymes No results for input(s): CKTOTAL, CKMB, CKMBINDEX, TROPONINI in the last 72 hours.  BNP: BNP (last 3 results) Recent Labs    02/03/22 1544  BNP 841.9*    ProBNP (last 3 results) No results for input(s): PROBNP in the last 8760 hours.   D-Dimer No results for input(s): DDIMER in the last 72 hours. Hemoglobin A1C No results for input(s): HGBA1C in the last 72 hours. Fasting Lipid Panel No results for input(s): CHOL, HDL, LDLCALC, TRIG, CHOLHDL, LDLDIRECT in the last 72 hours.  Thyroid Function Tests No results for input(s): TSH, T4TOTAL, T3FREE, THYROIDAB in the last 72 hours.  Invalid input(s): FREET3  Other results:   Imaging    No results found.   Medications:     Scheduled Medications:  aspirin  81 mg Oral QHS   calcium carbonate  1 tablet Oral Q breakfast   chlorhexidine  15 mL Mouth Rinse BID   DULoxetine  60 mg Oral BID   gabapentin  100 mg Oral TID   heparin  5,000 Units Subcutaneous Q8H   HYDROcodone-acetaminophen  1 tablet Oral QID   meclizine  12.5 mg Oral TID   mouth rinse  15 mL Mouth Rinse q12n4p   midodrine  10 mg Oral TID WC   pantoprazole  40 mg Oral BID   polyethylene glycol  17 g Oral BID   potassium chloride  20 mEq Oral Daily   rOPINIRole  1 mg Oral Daily   And    rOPINIRole  2 mg Oral BID   scopolamine  1 patch Transdermal Q72H   senna  1 tablet Oral Daily   sodium chloride flush  3 mL Intravenous Q12H   sucralfate  1 g Oral TID WC & HS   torsemide  40 mg Oral Daily   triamcinolone ointment  1 application Topical BID   Vitamin D (Ergocalciferol)  50,000 Units Oral Q7 days    Infusions:  sodium chloride Stopped (02/08/22 0155)    PRN Medications: sodium chloride, acetaminophen, albuterol, benzocaine, lidocaine (PF), ondansetron (ZOFRAN) IV, sodium chloride flush    Patient Profile   76 y/o Female w/ chronic diastolic heart failure, h/o orthostatic hypotension with multiple falls, exertional dyspnea and severe peripheral neuropathy of uncertain etiology, admitted for acute on chronic diastolic heart failure and AKI. RHC showed elevated filling pressures, normal CO. Being evaluated for amyloid.   Assessment/Plan   1. Acute on chronic diastolic CHF: Echo from this admission shows EF 55-60%, mild LVH, grade II diastolic dysfunction, normal RV size and systolic function, mild aortic stenosis with mild-moderate aortic insufficiency, mild-moderate MR. No significant CAD on remote cath but had anomalous RCA.  Cardiolite this admission showed EF 50%, no ischemia/infarction. PYP scan was not suggestive of TTR cardiac amyloidosis.  cMRI was somewhat equivocal => LV walls not thickened and there was delayed enhancement in the basal inferior wall in a subepicardial pattern more concerning for myocarditis than amyloidosis.  However, the extracellular volume percentage was 49%, which is suggestive of amyloidosis.  SPEP negative.  Creatinine down to 1.93 off IV Lasix, looks mildly volume overloaded.  - She has severe peripheral neuropathy of uncertain etiology, orthostatic hypotension likely from autonomic neuropathy, and bilateral carpal tunnel syndrome.  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  However, her echo is not very impressive for  amyloidosis (minimal LVH), the cMRI is equivocal for amyloidosis (not classic LGE pattern but ECV percentage high), and the PYP scan does not look suggestive to me.  Myeloma panel and UPEP negative.  Plan to repeat  PYP scan in 4-5 months for progression, will send for genetic testing for hereditary transthyretin amyloidosis as outpatient as well.   - Patient was volume overloaded on exam and by RHC but has had rise in creatinine from baseline 1.7 to 1.99 with attempts at diuresis (also in setting of contrast load and Bactrim use).   - Volume status stable. Continue  torsemide 40 mg daily and KCl 20 daily.  - continue midodrine for BP support  for now. Consider weaning when BP improves.  - Add bisoprolol for tachycardia.  - SGLT2 inhibitor in future if renal function remains stable. Could consider Jardiance but will hold off for now.  2. AKI on CKD stage 3: Prior creatinine 1.4.  At admission, she was 1.74. Normal CO on RHC. Contrast nephropathy may play a role (had PE CTA at admission).  Also consider Bactrim.  She developed AKI with creatinine up to 2.39 with diuresis, 2.2 today  - Avoid contrast.  - Bactrim now discontinued - avoid hypotension. Continue midodrine to support BP  - BMET daily  3. Skin: Concern skin infection, primarily lower legs.  Culture from leg lesions at outside dermatologist positive for S. Aureus + Pseudomonas. Seen by ID, no active cellulitis apparent, antibiotics stopped.   4. Orthostatic hypotension: In setting of volume overload.  Suspect autonomic neuropathy. Improving.  - Wear compression stockings.  - Wear abdominal binder.  - Continue midodrine 10 mg tid.  5. 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 Call for interrogation.  6. Aortic valve disorder: Mild AS, mild-moderate AI on 2/23 echo.  7. Elevated troponin: Mild elevation with no trend.  No chest pain currently.  Cardiolite with no ischemia.  Suspect demand ischemia from volume overload.  8. Nausea/abdominal pain: Gall  stones on US abdomen but no evidence of cholecystitis. AF. No leukocytosis. Lactic acid 0.9 yesterday. Lipase WNL, amylase 18. LFTs not elevated. CT abdomen/pelvis with cholelithiasis, urinary bladder distention, stool throughout colon consistent with constipation. It is possible that this was due to constipation as well as vertigo/BPPV.  Appreciate GI input.  Nausea improving.  - Continue treatment of BPPV, meclizine and scopolamine.  9. Tachycardia Rate in the 150s today--> 110s without intervention. Sinus tachy on ECG Check LINQ.  - Add bisoprolol 2.5 mg daily  - Stop Adderall.   Discussed with Dr Broadus John at the bedside.    Length of Stay: [place], NP  02/12/2022, 8:45 AM  Advanced Heart Failure Team Pager [phone] (M-F; 7a - 5p)  Please contact [facility] for night-coverage after hours (5p -7a ) and weekends on [email or web address]  Patient seen with NP, agree with the above note.   Patient noted to be more tachycardic earlier this morning.   Currently, sinus tachy in 110s.  ECG with sinus tachy.   She is eating breakfast this morning, nausea seems better.   SBP 100s-120s.  She has not been out of bed much.    Creatinine 1.92 => 2.18.   General: NAD Neck: No JVD, no thyromegaly or thyroid nodule.  Lungs: Clear to auscultation bilaterally with normal respiratory effort. CV: Nondisplaced PMI.  Heart mildly tachy, regular S1/S2, no S3/S4, 2/6 early SEM RUSB.  1+ ankle edema.  Abdomen: Soft, nontender, no hepatosplenomegaly, no distention.  Skin: Intact without lesions or rashes.  Neurologic: Alert and oriented x 3.  Psych: Normal affect. Extremities: No clubbing or cyanosis.  HEENT: Normal.   With persistent sinus tachy, will add bisoprolol 2.5 mg daily (was on bisoprolol at home) and stop Adderrall.  I have not seen any definite atrial fibrillation.   Continue midodrine, abdominal binder, and compression stockings for orthostatic hypotension.   Would continue current  torsemide 40 daily but follow creatinine closely. Hopefully will stabilize now that she is eating her meals.   No definite evidence for cardiac amyloidosis though MRI is equivocal. Would repeat PYP scan in a few months as outpatient.   Hopefully to CIR soon.   Loralie Champagne 02/12/2022  9:17 AM
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
## 2022-02-12 NOTE — TOC Benefit Eligibility Note (Signed)
Patient Advocate Encounter ? ?Insurance verification completed.   ? ?The patient is currently admitted and upon discharge could be taking Jardiance 10 mg. ? ?The current 30 day co-pay is, $47.00.  ? ?The patient is insured through [employer] Part D  ? ? ? ?Lyndel Safe, CPhT ?Pharmacy Patient Advocate Specialist ?[facility] Patient Advocate Team ?Direct Number: [phone]  Fax: [phone] ? ? ? ? ? ?  ?

## 2022-02-12 NOTE — Progress Notes (Addendum)
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, diuresed with IV Lasix, hospitalization complicated by acute kidney injury and orthostatic hypotension, subsequently developed vertigo, nausea vomiting as well and abdominal pain, constipation   Subjective: -Some intermittent dizziness and vertigo, denies abdominal pain today, had BM yesterday  Assessment and Plan:  * Acute on chronic diastolic CHF  -2D echo noted EF of 55-60%, preserved RV systolic function  -Cardiolite this admission noted preserved EF, no ischemia -PYP scan not suggestive of cardiac amyloidosis, MRI with delayed enhancement in basal inferior wall and septum epicardial pattern, increased extracellular volume percent-45% suggestive of amyloidosis, SPEP + for free lamba light chain will check ratio -Diuresed with IV Lasix earlier this admission -She is 6.6 L negative, transitioned to oral torsemide, midodrine -Symptoms of orthostatic hypotension versus BPPV -Check orthostatics, patient also reports 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  Overdiuresis, check orthostatics -Restarting bisoprolol at low-dose, cutting back on Adderall today -Improving, monitor  Vertigo -Known history of BPPV and high risk of orthostatic hypotension as well -Check orthostatics and PT eval  Urinary retention -Resolved  Constipation -Resolved, continue bowel regimen  Class 1 obesity-  Calculated BMI is 30.5   Essential hypertension-   Continue with midodrine   Pulmonary hypertension -Continue O2, diuretics as above  Hyperlipidemia-  Continue with statin therapy.   DVT prophylaxis: Heparin subcutaneous Code Status: Full code Family Communication: Discussed patient in detail, no family at bedside Disposition Plan: CIR in 1 to 2 days if stable   Consultants:    Procedures:   Antimicrobials:    Objective: Vitals:   02/11/22 0405 02/11/22 1125 02/11/22 1905 02/12/22 0335  BP: 130/66 (!) 108/54 134/63 129/68  Pulse:   (!) 104 (!) 107  Resp: 20 14  17   Temp: 98 F (36.7 C)  98.3 F (36.8 C) 97.8 F (36.6 C)  TempSrc:    Axillary  SpO2: 96%  98%   Weight: 86.4 kg   84.6 kg  Height:        Intake/Output Summary (Last 24 hours) at 02/12/2022 1058 Last data filed at 02/12/2022 0600 Gross per 24 hour  Intake 480 ml  Output 1300 ml  Net -820 ml   Filed Weights   02/10/22 0500 02/11/22 0405 02/12/22 0335  Weight: 88.2 kg 86.4 kg 84.6 kg    Examination:  General exam: Pleasant elderly female laying in bed, AAOx3, no distress HEENT: No JVD CVS: S1-S2, regular rhythm, tachycardic, systolic murmur Lungs: Decreased breath sounds bases otherwise clear Abdomen: Soft, nontender, bowel sounds present  Extremities: No edema, Unna boots on  Skin: No rashes on exposed skin Psychiatry: Mood & affect appropriate.     Data Reviewed:   CBC: Recent Labs  Lab 02/06/22 0358 02/06/22 1040 02/09/22 1030 02/10/22 0811  WBC 7.2  --  6.4 8.5  NEUTROABS  --   --  4.2  --   HGB 9.1* 9.2*   9.5* 10.4* 10.6*  HCT 27.9* 27.0*   28.0* 31.7* 33.3*  MCV 99.3  --  101.3* 99.1  PLT 272  --  303 161   Basic Metabolic Panel: Recent Labs  Lab 02/08/22 0146 02/09/22 0253 02/10/22 0811 02/11/22 0301 02/12/22 0351  NA 137 140 141 143 140  K 4.8 4.3 4.2 4.1 4.1  CL 103 101 100 102 97*  CO2 19* 25 29 26 31   GLUCOSE 102* 93 92 80 100*  BUN 38* 41* 45* 44* 48*  CREATININE 2.19* 2.12* 2.39* 1.92* 2.18*  CALCIUM  8.9 8.9 9.0 9.4 9.4  MG  --   --   --  2.4  --    GFR: Estimated Creatinine Clearance: 25.3 mL/min (A) (by C-G formula based on SCr of 2.18 mg/dL (H)). Liver Function Tests: Recent Labs  Lab 02/09/22 1030 02/10/22 0811  AST 13* 15  ALT 16 15  ALKPHOS 99 106  BILITOT 0.7 0.4  PROT 6.7 6.5  ALBUMIN 2.7* 2.6*   Recent Labs  Lab 02/09/22 1030  LIPASE 26  AMYLASE 18*   Recent Labs  Lab 02/10/22 0811  AMMONIA 21   Coagulation Profile: No results for input(s): INR, PROTIME in the last 168 hours. Cardiac Enzymes: No results for input(s): CKTOTAL, CKMB, CKMBINDEX, TROPONINI in the last 168 hours. BNP (last 3 results) No results for input(s): PROBNP in the last 8760 hours. HbA1C: No results for input(s): HGBA1C in the last 72 hours. CBG: No results for input(s): GLUCAP in the last 168 hours. Lipid Profile: No results for input(s): CHOL, HDL, LDLCALC, TRIG, CHOLHDL, LDLDIRECT in the last 72 hours. Thyroid Function Tests: No results for input(s): TSH, T4TOTAL, FREET4, T3FREE, THYROIDAB in the last 72 hours. Anemia Panel: No results for input(s): VITAMINB12, FOLATE, FERRITIN, TIBC, IRON, RETICCTPCT in the last 72 hours. Urine analysis:    Component Value Date/Time   COLORURINE YELLOW 02/10/2022 1407   APPEARANCEUR HAZY (A) 02/10/2022 1407   LABSPEC 1.016 02/10/2022 1407   PHURINE 5.0 02/10/2022 1407   GLUCOSEU NEGATIVE 02/10/2022 1407   HGBUR MODERATE (A) 02/10/2022 1407   BILIRUBINUR NEGATIVE 02/10/2022 1407   KETONESUR 5 (A) 02/10/2022 1407   PROTEINUR NEGATIVE 02/10/2022 1407   UROBILINOGEN 0.2 03/22/2015 1414   NITRITE NEGATIVE 02/10/2022 1407   LEUKOCYTESUR MODERATE (A) 02/10/2022 1407   Sepsis Labs: @LABRCNTIP (procalcitonin:4,lacticidven:4)  ) Recent Results (from the past 240 hour(s))  SARS Coronavirus 2 by RT PCR (hospital order, performed in [facility] hospital lab) Nasopharyngeal Nasopharyngeal Swab     Status: None   Collection Time: 02/06/22  3:42 AM    Specimen: Nasopharyngeal Swab  Result Value Ref Range Status   SARS Coronavirus 2 NEGATIVE NEGATIVE Final    Comment: (NOTE) SARS-CoV-2 target nucleic acids are NOT DETECTED.  The SARS-CoV-2 RNA is generally detectable in upper and lower respiratory specimens during the acute phase of infection. The lowest concentration of SARS-CoV-2 viral copies this assay can detect is 250 copies / mL. A negative result does not preclude SARS-CoV-2 infection and should not be used as the sole basis for treatment or other patient management decisions.  A negative result may occur with improper specimen collection / handling, submission of specimen other than nasopharyngeal swab, presence of viral mutation(s) within the areas targeted by this assay, and inadequate number of viral copies (<250 copies / mL). A negative result must be combined with clinical observations, patient history, and epidemiological information.  Fact Sheet for Patients:   StrictlyIdeas.no  Fact Sheet for Healthcare Providers: BankingDealers.co.za  This test is not yet approved or  cleared by the [place] FDA and has been authorized for detection  and/or diagnosis of SARS-CoV-2 by FDA under an Emergency Use Authorization (EUA).  This EUA will remain in effect (meaning this test can be used) for the duration of the COVID-19 declaration under Section 564(b)(1) of the Act, 21 U.S.C. section 360bbb-3(b)(1), unless the authorization is terminated or revoked sooner.  Performed at [facility], [place] [address] [place], [place] [address]   Urine Culture     Status: None (Preliminary result)   Collection Time: 02/10/22  2:07 PM   Specimen: In/Out Cath Urine  Result Value Ref Range Status   Specimen Description IN/OUT CATH URINE  Final   Special Requests NONE  Final   Culture   Final    CULTURE REINCUBATED FOR BETTER GROWTH Performed at [facility], [place]  [address], [place], [place] [address]    Report Status PENDING  Incomplete     Radiology Studies: No results found.   Scheduled Meds:  aspirin  81 mg Oral QHS   bisoprolol  2.5 mg Oral Daily   calcium carbonate  1 tablet Oral Q breakfast   chlorhexidine  15 mL Mouth Rinse BID   DULoxetine  60 mg Oral BID   gabapentin  100 mg Oral TID   heparin  5,000 Units Subcutaneous Q8H   HYDROcodone-acetaminophen  1 tablet Oral QID   meclizine  12.5 mg Oral TID   mouth rinse  15 mL Mouth Rinse q12n4p   midodrine  10 mg Oral TID WC   pantoprazole  40 mg Oral BID   polyethylene glycol  17 g Oral BID   potassium chloride  20 mEq Oral Daily   rOPINIRole  1 mg Oral Daily   And   rOPINIRole  2 mg Oral BID   scopolamine  1 patch Transdermal Q72H   senna  1 tablet Oral Daily   sodium chloride flush  3 mL Intravenous Q12H   sucralfate  1 g Oral TID WC & HS   torsemide  40 mg Oral Daily   triamcinolone ointment  1 application Topical BID   Vitamin D (Ergocalciferol)  50,000 Units Oral Q7 days   Continuous Infusions:  sodium chloride Stopped (02/08/22 0155)     LOS: 9 days    Time spent: 81min  Domenic Polite, MD [facility]   02/12/2022, 10:58 AM

## 2022-02-13 LAB — COMPREHENSIVE METABOLIC PANEL
ALT: 15 U/L (ref 0–44)
AST: 21 U/L (ref 15–41)
Albumin: 2.9 g/dL — ABNORMAL LOW (ref 3.5–5.0)
Alkaline Phosphatase: 103 U/L (ref 38–126)
Anion gap: 15 (ref 5–15)
BUN: 60 mg/dL — ABNORMAL HIGH (ref 8–23)
CO2: 28 mmol/L (ref 22–32)
Calcium: 9.2 mg/dL (ref 8.9–10.3)
Chloride: 97 mmol/L — ABNORMAL LOW (ref 98–111)
Creatinine, Ser: 2.86 mg/dL — ABNORMAL HIGH (ref 0.44–1.00)
GFR, Estimated: 17 mL/min — ABNORMAL LOW (ref >60)
Glucose, Bld: 99 mg/dL (ref 70–99)
Potassium: 4.1 mmol/L (ref 3.5–5.1)
Sodium: 140 mmol/L (ref 135–145)
Total Bilirubin: 0.7 mg/dL (ref 0.3–1.2)
Total Protein: 6.8 g/dL (ref 6.5–8.1)

## 2022-02-13 LAB — URINALYSIS, ROUTINE W REFLEX MICROSCOPIC
Bilirubin Urine: NEGATIVE
Glucose, UA: NEGATIVE mg/dL
Ketones, ur: NEGATIVE mg/dL
Nitrite: NEGATIVE
Protein, ur: 100 mg/dL — AB
RBC / HPF: >50 RBC/hpf — ABNORMAL HIGH (ref 0–5)
Specific Gravity, Urine: 1.015 (ref 1.005–1.030)
WBC, UA: >50 WBC/hpf — ABNORMAL HIGH (ref 0–5)
pH: 5 (ref 5.0–8.0)

## 2022-02-13 LAB — CBC
HCT: 38.8 % (ref 36.0–46.0)
Hemoglobin: 12.1 g/dL (ref 12.0–15.0)
MCH: 31.7 pg (ref 26.0–34.0)
MCHC: 31.2 g/dL (ref 30.0–36.0)
MCV: 101.6 fL — ABNORMAL HIGH (ref 80.0–100.0)
Platelets: 311 10*3/uL (ref 150–400)
RBC: 3.82 MIL/uL — ABNORMAL LOW (ref 3.87–5.11)
RDW: 15.2 % (ref 11.5–15.5)
WBC: 6.7 10*3/uL (ref 4.0–10.5)
nRBC: 0 % (ref 0.0–0.2)

## 2022-02-13 LAB — KAPPA/LAMBDA LIGHT CHAINS
Kappa free light chain: 37.3 mg/L — ABNORMAL HIGH (ref 3.3–19.4)
Kappa, lambda light chain ratio: 0.08 — ABNORMAL LOW (ref 0.26–1.65)
Lambda free light chains: 476.1 mg/L — ABNORMAL HIGH (ref 5.7–26.3)

## 2022-02-13 LAB — MAGNESIUM: Magnesium: 2.4 mg/dL (ref 1.7–2.4)

## 2022-02-13 MED ORDER — TORSEMIDE 20 MG PO TABS: 40.0000 mg | ORAL_TABLET | Freq: Every day | ORAL | Status: DC

## 2022-02-13 MED ORDER — BETHANECHOL CHLORIDE 10 MG PO TABS
10.0000 mg | ORAL_TABLET | Freq: Three times a day (TID) | ORAL | Status: DC
  Administered 2022-02-13 – 2022-02-14 (×3): 10 mg via ORAL
  Filled 2022-02-13 (×3): qty 1

## 2022-02-13 MED ORDER — SODIUM CHLORIDE 0.9 % IV SOLN: INTRAVENOUS | Status: AC

## 2022-02-13 MED ORDER — MIDODRINE HCL 5 MG PO TABS
5.0000 mg | ORAL_TABLET | Freq: Once | ORAL | Status: AC
  Administered 2022-02-13: 5 mg via ORAL
  Filled 2022-02-13: qty 1

## 2022-02-13 MED ORDER — MIDODRINE HCL 5 MG PO TABS
15.0000 mg | ORAL_TABLET | Freq: Three times a day (TID) | ORAL | Status: DC
  Administered 2022-02-13 – 2022-02-15 (×7): 15 mg via ORAL
  Filled 2022-02-13 (×7): qty 3

## 2022-02-13 NOTE — Progress Notes (Signed)
IP rehab admissions - I spoke with attending MD.  Patient is not medically ready yet for CIR.  Will continue to follow progress daily.  Call for questions.  [phone] ?

## 2022-02-13 NOTE — Progress Notes (Addendum)
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, diuresed with IV Lasix, hospitalization complicated by acute kidney injury and orthostatic hypotension, subsequently developed vertigo, nausea vomiting as well and abdominal pain, constipation   Subjective: -Some intermittent dizziness and vertigo, denies abdominal pain today, had BM yesterday  Assessment and Plan:  * Acute on chronic diastolic CHF  -2D echo noted EF of 55-60%, preserved RV systolic function  -Cardiolite this admission noted preserved EF, no ischemia -PYP scan not suggestive of cardiac amyloidosis, MRI with delayed enhancement in basal inferior wall and septum epicardial pattern, increased extracellular volume percent-45% suggestive of amyloidosis, SPEP + for free lamba light chain will check ratio -Diuresed with IV Lasix earlier this admission -She is 6.6 L negative, transitioned to oral torsemide, midodrine now held with bump in creatinine to 2.8, Orthostatics positive, BP dropped to 7mmhg on standing, will d/w Cards about gentle IVF x579ml today -BmP in am  AKi on CKD3a -Baseline creatinine around 1.4, creatinine peaked at 2.4, now worsening again -Likely multifactorial, from cardiorenal, contrast for CTPA 2/21 and also was on Bactrim previously -Holding diuretics today -Avoid hypotension, continue midodrine dose increased -BMP in a.m.  Sinus tachycardia -Improved after restarting bisoprolol and holding Adderall  Dizziness, orthostatic hypotension -See discussion above  Urinary retention -Resolved  Yeast in urine -Consistent with colonization, she does not have symptoms, no antifungals indicated, no further issues with  retention now  Constipation -Resolved, continue bowel regimen  Class 1 obesity-  Calculated BMI is 30.5   Essential hypertension-  Continue with midodrine   Pulmonary hypertension -Continue O2, diuretics as above  Hyperlipidemia-  Continue with statin therapy.   DVT prophylaxis: Heparin subcutaneous Code Status: Full code Family Communication: Discussed patient in detail, no family at bedside Disposition Plan: CIR in 48 hours if stable   Consultants:  Heart failure team  Procedures:   Antimicrobials:    Objective: Vitals:   02/13/22 0359 02/13/22 0813 02/13/22 0816 02/13/22 1023  BP: (!) 111/54 (!) 98/55 (!) 93/56 (!) 114/51  Pulse: 88 (!) 101 (!) 101 96  Resp: 14 16 15 17   Temp: (!) 97.5 F (36.4 C) 98.6 F (37 C) 98.6 F (37 C) 98.7 F (37.1 C)  TempSrc: Oral Oral  Oral  SpO2: 98% 92% 93%   Weight:      Height:        Intake/Output Summary (Last 24 hours) at 02/13/2022 1200 Last data filed at 02/13/2022 1123 Gross per 24 hour  Intake 480 ml  Output 650 ml  Net -170 ml   Filed Weights   02/11/22 0405 02/12/22 0335 02/13/22 0243  Weight: 86.4 kg 84.6 kg 83.6 kg    Examination:  General exam: Pleasant elderly female laying in bed, AAOx3, no distress HEENT: No JVD CVS: S1-S2, regular rhythm, systolic murmur Lungs: Decreased breath sounds to bases Abdomen: Soft, nontender, bowel sounds present Extremities: No edema, Unna boots on Skin: No rashes on exposed skin Psychiatry: Mood & affect appropriate.     Data Reviewed:   CBC: Recent Labs  Lab 02/09/22 1030 02/10/22 0811 02/13/22 0218  WBC 6.4 8.5 6.7  NEUTROABS 4.2  --   --   HGB 10.4* 10.6* 12.1  HCT 31.7* 33.3* 38.8  MCV 101.3* 99.1 101.6*  PLT 303 337 627   Basic Metabolic Panel: Recent Labs  Lab 02/09/22 0253 02/10/22 0811 02/11/22 0301 02/12/22 0351 02/13/22 0218  NA 140 141 143 140 140  K 4.3 4.2 4.1 4.1 4.1  CL 101 100 102 97* 97*  CO2 25 29 26 31 28   GLUCOSE 93 92 80  100* 99  BUN 41* 45* 44* 48* 60*  CREATININE 2.12* 2.39* 1.92* 2.18* 2.86*  CALCIUM 8.9 9.0 9.4 9.4 9.2  MG  --   --  2.4  --  2.4   GFR: Estimated Creatinine Clearance: 19.2 mL/min (A) (by C-G formula based on SCr of 2.86 mg/dL (H)). Liver Function Tests: Recent Labs  Lab 02/09/22 1030 02/10/22 0811 02/13/22 0218  AST 13* 15 21  ALT 16 15 15   ALKPHOS 99 106 103  BILITOT 0.7 0.4 0.7  PROT 6.7 6.5 6.8  ALBUMIN 2.7* 2.6* 2.9*   Recent Labs  Lab 02/09/22 1030  LIPASE 26  AMYLASE 18*   Recent Labs  Lab 02/10/22 0811  AMMONIA 21   Coagulation Profile: No results for input(s): INR, PROTIME in the last 168 hours. Cardiac Enzymes: No results for input(s): CKTOTAL, CKMB, CKMBINDEX, TROPONINI in the last 168 hours. BNP (last 3 results) No results for input(s): PROBNP in the last 8760 hours. HbA1C: No results for input(s): HGBA1C in the last 72 hours. CBG: No results for input(s): GLUCAP in the last 168 hours. Lipid Profile: No results for input(s): CHOL, HDL, LDLCALC, TRIG, CHOLHDL, LDLDIRECT in the last 72 hours. Thyroid Function Tests: No results for input(s): TSH, T4TOTAL, FREET4, T3FREE, THYROIDAB in the last 72 hours. Anemia Panel: No results for input(s): VITAMINB12, FOLATE, FERRITIN, TIBC, IRON, RETICCTPCT in the last 72 hours. Urine analysis:    Component Value Date/Time   COLORURINE YELLOW 02/10/2022 1407   APPEARANCEUR HAZY (A) 02/10/2022 1407   LABSPEC 1.016 02/10/2022 1407   PHURINE 5.0 02/10/2022 1407   GLUCOSEU NEGATIVE 02/10/2022 1407   HGBUR MODERATE (A) 02/10/2022 1407   BILIRUBINUR NEGATIVE 02/10/2022 1407   KETONESUR 5 (A) 02/10/2022 1407   PROTEINUR NEGATIVE 02/10/2022 1407   UROBILINOGEN 0.2 03/22/2015 1414   NITRITE NEGATIVE 02/10/2022 1407   LEUKOCYTESUR MODERATE (A) 02/10/2022 1407   Sepsis Labs: @LABRCNTIP (procalcitonin:4,lacticidven:4)  ) Recent Results (from the past 240 hour(s))  SARS Coronavirus 2 by RT PCR (hospital order,  performed in [place] hospital lab) Nasopharyngeal Nasopharyngeal Swab     Status: None   Collection Time: 02/06/22  3:42 AM   Specimen: Nasopharyngeal Swab  Result Value Ref Range Status   SARS Coronavirus 2 NEGATIVE NEGATIVE Final    Comment: (NOTE) SARS-CoV-2 target nucleic acids are NOT DETECTED.  The SARS-CoV-2 RNA is generally detectable in upper and lower respiratory specimens during the acute phase of infection. The lowest concentration of SARS-CoV-2 viral copies this assay can detect is 250 copies / mL. A negative result does not preclude SARS-CoV-2 infection and should not be used as the sole basis for treatment or other patient management decisions.  A negative result may occur with improper specimen collection / handling, submission of specimen other than nasopharyngeal swab, presence of viral mutation(s) within the areas targeted by this assay, and inadequate number of viral copies (<250 copies / mL). A negative result must be combined with clinical observations, patient history, and epidemiological information.  Fact Sheet for Patients:   StrictlyIdeas.no  Fact Sheet for Healthcare Providers: BankingDealers.co.za  This test is not yet approved or  cleared by the [place]  States FDA and has been authorized for detection and/or diagnosis of SARS-CoV-2 by FDA under an Emergency Use Authorization (EUA).  This EUA will remain in effect (meaning this test can be used) for the duration of the COVID-19 declaration under Section 564(b)(1) of the Act, 21 U.S.C. section 360bbb-3(b)(1), unless the authorization is terminated or revoked sooner.  Performed at [facility], [place] [address] [place], [place] [address]   Urine Culture     Status: Abnormal   Collection Time: 02/10/22  2:07 PM   Specimen: In/Out Cath Urine  Result Value Ref Range Status   Specimen Description IN/OUT CATH URINE  Final   Special Requests   Final     NONE Performed at [facility], [place] [address] [place], [place] [address]    Culture >=100,000 COLONIES/mL YEAST (A)  Final   Report Status 02/12/2022 FINAL  Final     Radiology Studies: No results found.   Scheduled Meds:  aspirin  81 mg Oral QHS   bisoprolol  2.5 mg Oral Daily   calcium carbonate  1 tablet Oral Q breakfast   chlorhexidine  15 mL Mouth Rinse BID   DULoxetine  60 mg Oral BID   gabapentin  100 mg Oral TID   heparin  5,000 Units Subcutaneous Q8H   HYDROcodone-acetaminophen  1 tablet Oral QID   meclizine  12.5 mg Oral TID   mouth rinse  15 mL Mouth Rinse q12n4p   midodrine  15 mg Oral TID WC   pantoprazole  40 mg Oral BID   polyethylene glycol  17 g Oral BID   rOPINIRole  1 mg Oral Daily   And   rOPINIRole  2 mg Oral BID   scopolamine  1 patch Transdermal Q72H   senna  1 tablet Oral Daily   sodium chloride flush  3 mL Intravenous Q12H   sucralfate  1 g Oral TID WC & HS   triamcinolone ointment  1 application Topical BID   Vitamin D (Ergocalciferol)  50,000 Units Oral Q7 days   Continuous Infusions:  sodium chloride Stopped (02/08/22 0155)     LOS: 10 days    Time spent: 39min  Domenic Polite, MD [facility]   02/13/2022, 12:00 PM
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
## 2022-02-13 NOTE — Progress Notes (Signed)
?   02/13/22 0816  ?Assess: MEWS Score  ?Temp 98.6 ?F (37 ?C)  ?BP (!) 93/56  ?Pulse Rate (!) 101  ?Resp 15  ?Level of Consciousness Alert  ?SpO2 93 %  ?O2 Device Room Air  ?Patient Activity (if Appropriate) In bed  ?Assess: MEWS Score  ?MEWS Temp 0  ?MEWS Systolic 1  ?MEWS Pulse 1  ?MEWS RR 0  ?MEWS LOC 0  ?MEWS Score 2  ?MEWS Score Color Yellow  ?Assess: if the MEWS score is Yellow or Red  ?Were vital signs taken at a resting state? Yes  ?Focused Assessment No change from prior assessment  ?Early Detection of Sepsis Score *See Row Information* Low  ?MEWS guidelines implemented *See Row Information* Yes  ?Treat  ?MEWS Interventions Administered scheduled meds/treatments  ?Pain Scale 0-10  ?Pain Score 4  ?Pain Type Chronic pain  ?Take Vital Signs  ?Increase Vital Sign Frequency  Yellow: Q 2hr X 2 then Q 4hr X 2, if remains yellow, continue Q 4hrs  ?Escalate  ?MEWS: Escalate Yellow: discuss with charge nurse/RN and consider discussing with provider and RRT  ?Notify: Charge Nurse/RN  ?Name of Charge Nurse/RN Notified Evelina Bucy, RN  ?Date Charge Nurse/RN Notified 02/13/22  ?Time Charge Nurse/RN Notified [phone]  ?Document  ?Patient Outcome Stabilized after interventions  ?Progress note created (see row info) Yes  ? ? ?

## 2022-02-13 NOTE — Progress Notes (Signed)
Physical Therapy Treatment ?Patient Details ?Name: [name] ?MRN: [id number] ?DOB: [birth year] ?Today's Date: 02/13/2022 ? ? ?History of Present Illness Patient is a 76 yo female admitted on 02/03/22 with chest pain and DOE. Patient also with cellulitis, staph aureus and pseudomonas in BLEs. R heart cath 02/06/22. Also with R wrist fx and L D5 fx in 1/23.  PMH of CAD with anomalous origin of RCA, cryptogenic CVA s/p LINQ with no atrial fibrillation, carotid stenosis, sleep apnea, secondary pulmonary hypertension (V/Q scan negative for chronic thromboembolic disease), hyperlipidemia, restless leg syndrome, peripheral neuropathy and GERD ? ?  ?PT Comments  ? ? Patient complaining of light headed dizziness and orthostatics still positive despite binder and thigh hi compression stockings.  She, however, improved symptoms while up on [facility] and was able to walk several steps to recliner with platform walker.  Continue to recommend acute inpatient rehab at d/c.  PT will continue to follow.  ?Orthostatic VS for the past 24 hrs (Last 3 readings): ? BP- Lying Pulse- Lying BP- Sitting Pulse- Sitting BP- Standing at 0 minutes Pulse- Standing at 0 minutes  ?02/13/22 0916 95/58 98 102/59 104 (!) 71/56 115  ?    ?Recommendations for follow up therapy are one component of a multi-disciplinary discharge planning process, led by the attending physician.  Recommendations may be updated based on patient status, additional functional criteria and insurance authorization. ? ?Follow Up Recommendations ? Acute inpatient rehab (3hours/day) ?  ?  ?Assistance Recommended at Discharge Frequent or constant Supervision/Assistance  ?Patient can return home with the following A lot of help with walking and/or transfers;A lot of help with bathing/dressing/bathroom;Direct supervision/assist for medications management;Assist for transportation ?  ?Equipment Recommendations ? BSC/3in1;Wheelchair (measurements PT);Wheelchair cushion (measurements PT)  ?   ?Recommendations for Other Services   ? ? ?  ?Precautions / Restrictions Precautions ?Precautions: Fall ?Precaution Comments: watch BP ?Required Braces or Orthoses: Other Brace ?Other Brace: compression stockings and abdominal binder ?Restrictions ?RUE Weight Bearing: Weight bear through elbow only  ?  ? ?Mobility ? Bed Mobility ?Overal bed mobility: Needs Assistance ?Bed Mobility: Supine to Sit ?  ?  ?Supine to sit: HOB elevated, Min guard ?  ?  ?General bed mobility comments: assist for scooting hips ?  ? ?Transfers ?Overall transfer level: Needs assistance ?Equipment used: Right platform walker ?Transfers: Sit to/from Stand, Bed to chair/wheelchair/BSC ?Sit to Stand: Mod assist ?Stand pivot transfers: Mod assist ?  ?  ?  ?  ?General transfer comment: cues for foot position and anterior weight shift able to stand with lifting help from EOB and from [facility] cues for hand placement ?  ? ?Ambulation/Gait ?Ambulation/Gait assistance: Min assist, Mod assist ?Gait Distance (Feet): 4 Feet ?Assistive device: Right platform walker ?Gait Pattern/deviations: Step-to pattern, Decreased stride length ?  ?  ?  ?General Gait Details: brought recliner close to [facility] and pt able to ambulate to chair with cues for walker management and turning to sit and for hand placement ? ? ?Stairs ?  ?  ?  ?  ?  ? ? ?Wheelchair Mobility ?  ? ?Modified Rankin (Stroke Patients Only) ?  ? ? ?  ?Balance Overall balance assessment: Needs assistance ?  ?Sitting balance-Leahy Scale: Fair ?Sitting balance - Comments: able to sit today on EOB and BSC without lateral lean ?  ?  ?Standing balance-Leahy Scale: Poor ?Standing balance comment: Reliant on BUE external support and min to mod A for balance; assist for hygiene after toileting ?  ?  ?  ?  ?  ?  ?  ?  ?  ?  ?  ?  ? ?  ?  Cognition Arousal/Alertness: Awake/alert ?Behavior During Therapy: Flat affect ?Overall Cognitive Status: Impaired/Different from baseline ?Area of Impairment: Attention,  Safety/judgement, Problem solving ?  ?  ?  ?  ?  ?  ?  ?  ?  ?Current Attention Level: Selective ?  ?  ?Safety/Judgement: Decreased awareness of deficits ?  ?Problem Solving: Slow processing, Difficulty sequencing, Requires verbal cues ?  ?  ?  ? ?  ?Exercises   ? ?  ?General Comments General comments (skin integrity, edema, etc.): see orthostatic BP's taken during session in flowsheet; applied binder and thigh hi compression stockings pt brought from home in sitting; toileted on BSC and took a long time to void ?  ?  ? ?Pertinent Vitals/Pain Pain Assessment ?Faces Pain Scale: Hurts little more ?Pain Location: L fingers ?Pain Descriptors / Indicators: Aching, Tender ?Pain Intervention(s): Monitored during session  ? ? ?Home Living   ?  ?  ?  ?  ?  ?  ?  ?  ?  ?   ?  ?Prior Function    ?  ?  ?   ? ?PT Goals (current goals can now be found in the care plan section) Progress towards PT goals: Progressing toward goals ? ?  ?Frequency ? ? ? Min 3X/week ? ? ? ?  ?PT Plan Current plan remains appropriate  ? ? ?Co-evaluation   ?  ?  ?  ?  ? ?  ?AM-PAC PT "6 Clicks" Mobility   ?Outcome Measure ? Help needed turning from your back to your side while in a flat bed without using bedrails?: A Little ?Help needed moving from lying on your back to sitting on the side of a flat bed without using bedrails?: A Lot ?Help needed moving to and from a bed to a chair (including a wheelchair)?: A Lot ?Help needed standing up from a chair using your arms (e.g., wheelchair or bedside chair)?: A Lot ?Help needed to walk in hospital room?: Total ?Help needed climbing 3-5 steps with a railing? : Total ?6 Click Score: 11 ? ?  ?End of Session Equipment Utilized During Treatment: Gait belt ?Activity Tolerance: Patient limited by fatigue;Treatment limited secondary to medical complications (Comment) (orthostatic hypotension) ?Patient left: in chair;with call bell/phone within reach;with chair alarm set ?  ?PT Visit Diagnosis: Unsteadiness on feet  (R26.81);Other abnormalities of gait and mobility (R26.89);Muscle weakness (generalized) (M62.81);History of falling (Z91.81);Difficulty in walking, not elsewhere classified (R26.2) ?  ? ? ?Time: 7972-8206 ?PT Time Calculation (min) (ACUTE ONLY): 35 min ? ?Charges:  $Therapeutic Activity: 23-37 mins          ?          ? ?Magda Kiel, PT ?Acute Rehabilitation Services ?ORVIF:[phone] ?Office:[phone] ?02/13/2022 ? ? ? ?Reginia Naas ?02/13/2022, 11:58 AM ? ?

## 2022-02-13 NOTE — Progress Notes (Addendum)
Patient ID: [name], female   DOB: [birth year], 76 y.o.   MRN: [id number]     Advanced Heart Failure Rounding Note  PCP-Cardiologist: Sinclair Grooms, MD   Subjective:    Developed severe nausea + abdominal pain over the weekend. KUB with moderate to large stool burden. US abdomen with gallstones but no evidence of cholecystitis. Heterogenous echogenicity of liver. CT abdomen/pelvis with cholelithiasis, urinary bladder distention, stool throughout colon consistent with constipation.   Scr 2.18>2.86  Torsemide now on hold.  SBP now 90s  Weight down another 2 lb, down 24 from admit.  Fluid intake improving. Still has decreased appetite. No dyspnea at rest.   Objective:   Weight Range: 83.6 kg Body mass index is 28.87 kg/m.   Vital Signs:   Temp:  [97.5 F (36.4 C)-99.2 F (37.3 C)] 98.6 F (37 C) (03/02 0816) Pulse Rate:  [88-101] 101 (03/02 0816) Resp:  [12-20] 15 (03/02 0816) BP: (93-117)/(54-59) 93/56 (03/02 0816) SpO2:  [92 %-100 %] 93 % (03/02 0816) Weight:  [83.6 kg] 83.6 kg (03/02 0243) Last BM Date : 02/11/22  Weight change: Filed Weights   02/11/22 0405 02/12/22 0335 02/13/22 0243  Weight: 86.4 kg 84.6 kg 83.6 kg    Intake/Output:   Intake/Output Summary (Last 24 hours) at 02/13/2022 0850 Last data filed at 02/13/2022 0405 Gross per 24 hour  Intake 480 ml  Output 450 ml  Net 30 ml      Physical Exam  General:  No distress. Sitting up in bed. HEENT: normal Neck: supple. no JVD. Carotids 2+ bilat; no bruits.  Cor: PMI nondisplaced. Regular rate & rhythm. No rubs, gallops or murmurs. Lungs: clear Abdomen: soft, nontender, nondistended. No hepatosplenomegaly.  Extremities: no cyanosis, clubbing, rash, dressings noted over LE wounds, 1+ edema, RUE cast on wrist Neuro: alert & orientedx3, cranial nerves grossly intact. moves all 4 extremities w/o difficulty. Affect flat   Telemetry   Sinus rhythm/sinus tach, 90s-100s  Labs    CBC Recent Labs     02/13/22 0218  WBC 6.7  HGB 12.1  HCT 38.8  MCV 101.6*  PLT 262   Basic Metabolic Panel Recent Labs    02/11/22 0301 02/12/22 0351 02/13/22 0218  NA 143 140 140  K 4.1 4.1 4.1  CL 102 97* 97*  CO2 26 31 28   GLUCOSE 80 100* 99  BUN 44* 48* 60*  CREATININE 1.92* 2.18* 2.86*  CALCIUM 9.4 9.4 9.2  MG 2.4  --  2.4   Liver Function Tests Recent Labs    02/13/22 0218  AST 21  ALT 15  ALKPHOS 103  BILITOT 0.7  PROT 6.8  ALBUMIN 2.9*   No results for input(s): LIPASE, AMYLASE in the last 72 hours.  Cardiac Enzymes No results for input(s): CKTOTAL, CKMB, CKMBINDEX, TROPONINI in the last 72 hours.  BNP: BNP (last 3 results) Recent Labs    02/03/22 1544  BNP 841.9*    ProBNP (last 3 results) No results for input(s): PROBNP in the last 8760 hours.   D-Dimer No results for input(s): DDIMER in the last 72 hours. Hemoglobin A1C No results for input(s): HGBA1C in the last 72 hours. Fasting Lipid Panel No results for input(s): CHOL, HDL, LDLCALC, TRIG, CHOLHDL, LDLDIRECT in the last 72 hours.  Thyroid Function Tests No results for input(s): TSH, T4TOTAL, T3FREE, THYROIDAB in the last 72 hours.  Invalid input(s): FREET3  Other results:   Imaging    No results found.  Medications:     Scheduled Medications:  aspirin  81 mg Oral QHS   bisoprolol  2.5 mg Oral Daily   calcium carbonate  1 tablet Oral Q breakfast   chlorhexidine  15 mL Mouth Rinse BID   DULoxetine  60 mg Oral BID   gabapentin  100 mg Oral TID   heparin  5,000 Units Subcutaneous Q8H   HYDROcodone-acetaminophen  1 tablet Oral QID   meclizine  12.5 mg Oral TID   mouth rinse  15 mL Mouth Rinse q12n4p   midodrine  10 mg Oral TID WC   pantoprazole  40 mg Oral BID   polyethylene glycol  17 g Oral BID   potassium chloride  20 mEq Oral Daily   rOPINIRole  1 mg Oral Daily   And   rOPINIRole  2 mg Oral BID   scopolamine  1 patch Transdermal Q72H   senna  1 tablet Oral Daily   sodium  chloride flush  3 mL Intravenous Q12H   sucralfate  1 g Oral TID WC & HS   [START ON 02/14/2022] torsemide  40 mg Oral Daily   triamcinolone ointment  1 application Topical BID   Vitamin D (Ergocalciferol)  50,000 Units Oral Q7 days    Infusions:  sodium chloride Stopped (02/08/22 0155)    PRN Medications: sodium chloride, acetaminophen, albuterol, benzocaine, lidocaine (PF), ondansetron (ZOFRAN) IV, sodium chloride flush    Patient Profile   76 y/o Female w/ chronic diastolic heart failure, h/o orthostatic hypotension with multiple falls, exertional dyspnea and severe peripheral neuropathy of uncertain etiology, admitted for acute on chronic diastolic heart failure and AKI. RHC showed elevated filling pressures, normal CO. Being evaluated for amyloid.   Assessment/Plan   1. Acute on chronic diastolic CHF: Echo from this admission shows EF 55-60%, mild LVH, grade II diastolic dysfunction, normal RV size and systolic function, mild aortic stenosis with mild-moderate aortic insufficiency, mild-moderate MR. No significant CAD on remote cath but had anomalous RCA.  Cardiolite this admission showed EF 50%, no ischemia/infarction. PYP scan was not suggestive of TTR cardiac amyloidosis.  cMRI was somewhat equivocal => LV walls not thickened and there was delayed enhancement in the basal inferior wall in a subepicardial pattern more concerning for myocarditis than amyloidosis.  However, the extracellular volume percentage was 49%, which is suggestive of amyloidosis.  SPEP negative.  Creatinine down to 1.93 off IV Lasix, looks mildly volume overloaded.  - She has severe peripheral neuropathy of uncertain etiology, orthostatic hypotension likely from autonomic neuropathy, and bilateral carpal tunnel syndrome.  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  However, her echo is not very impressive for amyloidosis (minimal LVH), the cMRI is equivocal for amyloidosis (not classic LGE  pattern but ECV percentage high), and the PYP scan does not look suggestive to me.  Myeloma panel and UPEP negative.  Plan to repeat  PYP scan in 4-5 months for progression, will send for genetic testing for hereditary transthyretin amyloidosis as outpatient as well.   - Patient volume overloaded on exam and by RHC but has had rise in creatinine from baseline 1.7 to 1.99 with attempts at diuresis (also in setting of contrast load and Bactrim use).   - Volume status stable. Stop Torsemide d/t bump in Scr. - Increase midodrine to 15 mg TID d/t soft BP - Continue bisoprolol 2.5 mg daily for tachycardia - SGLT2 inhibitor in future when renal function improves. 2. AKI on CKD stage 3: Prior creatinine  1.4.  At admission, she was 1.74. Normal CO on RHC. Contrast nephropathy may play a role (had PE CTA at admission).  Also consider Bactrim.  She developed AKI with creatinine up to 2.39 with diuresis, up to 2.86 today - Avoid contrast.  - Bactrim now discontinued - avoid hypotension. Increase midodrine as above. - BMET daily  3. Skin: Concern skin infection, primarily lower legs.  Culture from leg lesions at outside dermatologist positive for S. Aureus + Pseudomonas. Seen by ID, no active cellulitis apparent, antibiotics stopped.   4. Orthostatic hypotension: In setting of volume overload.  Suspect autonomic neuropathy. Improving.  - Wear compression stockings.  - Wear abdominal binder.  - Midodrine as above.  5. 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 No longer active. 6. Aortic valve disorder: Mild AS, mild-moderate AI on 2/23 echo.  7. Elevated troponin: Mild elevation with no trend.  No chest pain currently.  Cardiolite with no ischemia.  Suspect demand ischemia from volume overload.  8. Nausea/abdominal pain: Gall stones on US abdomen but no evidence of cholecystitis. AF. No leukocytosis. CT abdomen/pelvis with cholelithiasis, urinary bladder distention, stool throughout colon consistent with constipation. It  is possible that this was due to constipation as well as vertigo/BPPV.  Appreciate GI input.  Nausea improving.  - Continue treatment of BPPV, meclizine and scopolamine.  9. Tachycardia: Rate in the 150s yesterday--> spontaneously down to 110s - Rate improved with bisoprolol 2.5 mg daily - Stop Adderall.   Plan discussed with Dr. Broadus John.   Length of Stay: 10  [facility], Lynder Parents, PA-C  02/13/2022, 8:50 AM  Advanced Heart Failure Team Pager [phone] (M-F; 7a - 5p)  Please contact [facility] for night-coverage after hours (5p -7a ) and weekends on [email or web address]  Patient seen with PA, agree with the above note.   Patient noted to still be orthostatic today.  She is wearing abdominal binder and compression stockings.  Creatinine up from 2.18 to 2.86.  She has been on torsemide 40 mg daily.   Patient's main concern is small ulcerations on her fingers.   General: NAD Neck: No JVD, no thyromegaly or thyroid nodule.  Lungs: Clear to auscultation bilaterally with normal respiratory effort. CV: Nondisplaced PMI.  Heart regular S1/S2, no S3/S4, 1/6 SEM RUSB.  Trace ankle edema.   Abdomen: Soft, nontender, no hepatosplenomegaly, no distention.  Skin: Intact without lesions or rashes.  Neurologic: Alert and oriented x 3.  Psych: Normal affect. Extremities: No clubbing or cyanosis.  HEENT: Normal.   Still with orthostatic hypotension, increase midodrine to 15 mg tid and continue abdominal binder and compression stockings.   Creatinine up today, stop torsemide.  Will follow trend, will need to eventually restart torsemide at lower dose, likely 20 mg daily.  She is going to have a tenuous balance between volume overload and AKI/orthostasis.   Reports burning with urination, has yeast in urine => treatment per [facility].   Concerned about small hand ulcerations.  These have not changed since admission on my evaluation.  Seen by ID this admission and not thought to need antibiotics.   Continue to  work with PT, ideally to CIR eventually.   Loralie Champagne 02/13/2022 11:58 AM

## 2022-02-14 ENCOUNTER — Inpatient Hospital Stay (HOSPITAL_COMMUNITY): Payer: Medicare Other

## 2022-02-14 LAB — URINE CULTURE: Culture: >=100000 — AB

## 2022-02-14 LAB — BASIC METABOLIC PANEL
Anion gap: 13 (ref 5–15)
BUN: 65 mg/dL — ABNORMAL HIGH (ref 8–23)
CO2: 30 mmol/L (ref 22–32)
Calcium: 9 mg/dL (ref 8.9–10.3)
Chloride: 96 mmol/L — ABNORMAL LOW (ref 98–111)
Creatinine, Ser: 2.53 mg/dL — ABNORMAL HIGH (ref 0.44–1.00)
GFR, Estimated: 19 mL/min — ABNORMAL LOW (ref >60)
Glucose, Bld: 85 mg/dL (ref 70–99)
Potassium: 3.8 mmol/L (ref 3.5–5.1)
Sodium: 139 mmol/L (ref 135–145)

## 2022-02-14 LAB — CBC
HCT: 34.3 % — ABNORMAL LOW (ref 36.0–46.0)
Hemoglobin: 11 g/dL — ABNORMAL LOW (ref 12.0–15.0)
MCH: 32 pg (ref 26.0–34.0)
MCHC: 32.1 g/dL (ref 30.0–36.0)
MCV: 99.7 fL (ref 80.0–100.0)
Platelets: 275 10*3/uL (ref 150–400)
RBC: 3.44 MIL/uL — ABNORMAL LOW (ref 3.87–5.11)
RDW: 15.1 % (ref 11.5–15.5)
WBC: 6.4 10*3/uL (ref 4.0–10.5)
nRBC: 0 % (ref 0.0–0.2)

## 2022-02-14 MED ORDER — FLUCONAZOLE 100 MG PO TABS
100.0000 mg | ORAL_TABLET | Freq: Every day | ORAL | Status: DC
  Administered 2022-02-14 – 2022-02-15 (×2): 100 mg via ORAL
  Filled 2022-02-14 (×2): qty 1

## 2022-02-14 MED ORDER — GABAPENTIN 100 MG PO CAPS
100.0000 mg | ORAL_CAPSULE | Freq: Three times a day (TID) | ORAL | Status: DC
  Administered 2022-02-14 – 2022-02-15 (×3): 100 mg via ORAL
  Filled 2022-02-14 (×3): qty 1

## 2022-02-14 MED ORDER — BETHANECHOL CHLORIDE 5 MG PO TABS
5.0000 mg | ORAL_TABLET | Freq: Three times a day (TID) | ORAL | Status: DC
  Administered 2022-02-14 (×2): 5 mg via ORAL
  Filled 2022-02-14 (×3): qty 1

## 2022-02-14 MED ORDER — FLUCONAZOLE 100 MG PO TABS
100.0000 mg | ORAL_TABLET | Freq: Every day | ORAL | Status: DC
  Filled 2022-02-14: qty 1

## 2022-02-14 NOTE — Progress Notes (Signed)
Mobility Specialist Progress Note: ? ? 02/14/22 1200  ?Mobility  ?Activity Transferred to/from [facility]  ?Level of Assistance Minimal assist, patient does 75% or more ?(+2)  ?Assistive Device None  ?RUE Weight Bearing PWB ?(through elbow only)  ?Distance Ambulated (ft) 2 ft  ?Activity Response Tolerated well  ?$Mobility charge 1 Mobility  ? ?RN requesting assistance getting pt back to bed from [facility]. Required minA +2 to stand and take steps to EOB. Pt with good eccentric control sitting EOB, pt pleased with progress. Left pt in bed with Ortho present.  ? ?Nelta Numbers ?Acute Rehab ?Phone: [phone] ?Office Phone: [phone] ? ?

## 2022-02-14 NOTE — Care Management Important Message (Signed)
Important Message ? ?Patient Details  ?Name: [name] ?MRN: [id number] ?Date of Birth: 12-20-1945 ? ? ?Medicare Important Message Given:  Yes ? ? ? ? ?[name] ?02/14/2022, 8:11 AM ?

## 2022-02-14 NOTE — Progress Notes (Signed)
BLE dressing change done. ?

## 2022-02-14 NOTE — Progress Notes (Addendum)
Patient ID: [name], female   DOB: 06-09-1946, 76 y.o.   MRN: [id number]     Advanced Heart Failure Rounding Note  PCP-Cardiologist: Sinclair Grooms, MD   Subjective:     Scr 2.18>2.86>2.53  Torsemide now on hold. Given 500 cc IV fluids yesterday.  SBP 90s-low100s  Still having some orthostatic dizziness but improving. No dyspnea.  Increasing PO intake.   Objective:   Weight Range: 82.1 kg Body mass index is 28.35 kg/m.   Vital Signs:   Temp:  [97.8 F (36.6 C)-98.8 F (37.1 C)] 98 F (36.7 C) (03/03 0935) Pulse Rate:  [84-88] 84 (03/03 0935) Resp:  [13-17] 15 (03/03 1051) BP: (96-116)/(53-60) 101/53 (03/03 1051) SpO2:  [93 %-96 %] 93 % (03/03 0935) Weight:  [82.1 kg] 82.1 kg (03/03 0100) Last BM Date : 02/13/22  Weight change: Filed Weights   02/12/22 0335 02/13/22 0243 02/14/22 0100  Weight: 84.6 kg 83.6 kg 82.1 kg    Intake/Output:   Intake/Output Summary (Last 24 hours) at 02/14/2022 1103 Last data filed at 02/14/2022 0945 Gross per 24 hour  Intake 1438.69 ml  Output 1350 ml  Net 88.69 ml      Physical Exam  General:  No distress. Sitting up in bed. HEENT: normal Neck: supple. no JVD. Carotids 2+ bilat; no bruits.  Cor: PMI nondisplaced. Regular rate & rhythm. No rubs, gallops or murmurs. Lungs: clear Abdomen: soft, nontender, nondistended.  Extremities: no cyanosis, clubbing, rash, edema, + TED hose, Right forearm/wrist in cast Neuro: alert & orientedx3, cranial nerves grossly intact. moves all 4 extremities w/o difficulty. Affect pleasant    Telemetry   SR 80s-90s  Labs    CBC Recent Labs    02/13/22 0218 02/14/22 0340  WBC 6.7 6.4  HGB 12.1 11.0*  HCT 38.8 34.3*  MCV 101.6* 99.7  PLT 311 563   Basic Metabolic Panel Recent Labs    02/13/22 0218 02/14/22 0340  NA 140 139  K 4.1 3.8  CL 97* 96*  CO2 28 30  GLUCOSE 99 85  BUN 60* 65*  CREATININE 2.86* 2.53*  CALCIUM 9.2 9.0  MG 2.4  --    Liver Function  Tests Recent Labs    02/13/22 0218  AST 21  ALT 15  ALKPHOS 103  BILITOT 0.7  PROT 6.8  ALBUMIN 2.9*   No results for input(s): LIPASE, AMYLASE in the last 72 hours.  Cardiac Enzymes No results for input(s): CKTOTAL, CKMB, CKMBINDEX, TROPONINI in the last 72 hours.  BNP: BNP (last 3 results) Recent Labs    02/03/22 1544  BNP 841.9*    ProBNP (last 3 results) No results for input(s): PROBNP in the last 8760 hours.   D-Dimer No results for input(s): DDIMER in the last 72 hours. Hemoglobin A1C No results for input(s): HGBA1C in the last 72 hours. Fasting Lipid Panel No results for input(s): CHOL, HDL, LDLCALC, TRIG, CHOLHDL, LDLDIRECT in the last 72 hours.  Thyroid Function Tests No results for input(s): TSH, T4TOTAL, T3FREE, THYROIDAB in the last 72 hours.  Invalid input(s): FREET3  Other results:   Imaging    No results found.   Medications:     Scheduled Medications:  aspirin  81 mg Oral QHS   bethanechol  10 mg Oral TID   bisoprolol  2.5 mg Oral Daily   calcium carbonate  1 tablet Oral Q breakfast   chlorhexidine  15 mL Mouth Rinse BID   DULoxetine  60 mg Oral  BID   gabapentin  100 mg Oral TID   heparin  5,000 Units Subcutaneous Q8H   HYDROcodone-acetaminophen  1 tablet Oral QID   meclizine  12.5 mg Oral TID   mouth rinse  15 mL Mouth Rinse q12n4p   midodrine  15 mg Oral TID WC   pantoprazole  40 mg Oral BID   polyethylene glycol  17 g Oral BID   rOPINIRole  1 mg Oral Daily   And   rOPINIRole  2 mg Oral BID   scopolamine  1 patch Transdermal Q72H   senna  1 tablet Oral Daily   sodium chloride flush  3 mL Intravenous Q12H   sucralfate  1 g Oral TID WC & HS   triamcinolone ointment  1 application Topical BID   Vitamin D (Ergocalciferol)  50,000 Units Oral Q7 days    Infusions:  sodium chloride Stopped (02/08/22 0155)    PRN Medications: sodium chloride, acetaminophen, albuterol, benzocaine, lidocaine (PF), ondansetron (ZOFRAN) IV,  sodium chloride flush    Patient Profile   76 y/o Female w/ chronic diastolic heart failure, h/o orthostatic hypotension with multiple falls, exertional dyspnea and severe peripheral neuropathy of uncertain etiology, admitted for acute on chronic diastolic heart failure and AKI. RHC showed elevated filling pressures, normal CO. Being evaluated for amyloid.   Assessment/Plan   1. Acute on chronic diastolic CHF: Echo from this admission shows EF 55-60%, mild LVH, grade II diastolic dysfunction, normal RV size and systolic function, mild aortic stenosis with mild-moderate aortic insufficiency, mild-moderate MR. No significant CAD on remote cath but had anomalous RCA.  Cardiolite this admission showed EF 50%, no ischemia/infarction.    - She has severe peripheral neuropathy of uncertain etiology, orthostatic hypotension likely from autonomic neuropathy, and bilateral carpal tunnel syndrome.  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  However, her echo is not very impressive for amyloidosis (minimal LVH), the cMRI is equivocal for amyloidosis (not classic LGE pattern but ECV percentage high), and the PYP scan does not look suggestive. - Plan to repeat  PYP scan in 4-5 months for progression, will send for genetic testing for hereditary transthyretin amyloidosis as outpatient as well.  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 ? Significance. - Patient volume overloaded on exam and by RHC but has had rise in creatinine from baseline 1.7 to 1.99 with attempts at diuresis (also in setting of contrast load and Bactrim use).   - Diuresed with IV lasix. Now down 27 lb. Scr bumped to 2.86> 2.5 today. Holding Torsemide. Will likely need to restart in next couple of days at lower dose. Received IV fluids yesterday. - Continue midodrine 15 mg TID  - Continue bisoprolol 2.5 mg daily for tachycardia - SGLT2 inhibitor in future  when renal function improves. 2. AKI on CKD stage 3: Prior creatinine 1.4.  At admission, she was 1.74. Normal CO on RHC. Contrast nephropathy may play a role (had PE CTA at admission).  Also consider Bactrim.  She developed AKI with creatinine up to 2.39 with diuresis, up  further to 2.86 yesterday.  - Avoid contrast.  - Bactrim now discontinued - Continue midodrine 15 mg TID - Holding Torsemide - BMET daily  3. Skin: Concern skin infection, primarily lower legs.  Culture from leg lesions at outside dermatologist positive for S. Aureus + Pseudomonas. Seen by ID, no active cellulitis apparent, antibiotics stopped.   4. Orthostatic hypotension: In  setting of volume overload.  Suspect autonomic neuropathy. Improving. Has increased po intake. - Wear compression stockings.  - Wear abdominal binder.  - Midodrine as above.  5. 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 No longer active. 6. Aortic valve disorder: Mild AS, mild-moderate AI on 2/23 echo.  7. Elevated troponin: Mild elevation with no trend.  No chest pain currently.  Cardiolite with no ischemia.  Suspect demand ischemia from volume overload.  8. Nausea/abdominal pain: Gall stones on US abdomen but no evidence of cholecystitis. AF. No leukocytosis. CT abdomen/pelvis with cholelithiasis, urinary bladder distention, stool throughout colon consistent with constipation. It is possible that this was due to constipation as well as vertigo/BPPV.  Appreciate GI input.  Nausea improving.  - Continue treatment of BPPV, meclizine and scopolamine.  9. Tachycardia: Rate in the 150s yesterday--> spontaneously down to 110s - Rate improved to 80s-90s with bisoprolol 2.5 mg daily and stopping Adderall. 10. Yeast in urine -Consistent with colonization -Per TRH -Antifungals not felt to be needed  Plan for CIR when medically ready.   Length of Stay: Fletcher, Libby, PA-C  02/14/2022, 11:03 AM  Advanced Heart Failure Team Pager [phone] (M-F; 7a - 5p)   Please contact [facility] for night-coverage after hours (5p -7a ) and weekends on [email or web address]  Patient seen with PA, agree with the above note.   SBP 90s-100s, stood up without significant lightheadedness.  Creatinine improving, down to 2.5.   General: NAD Neck: JVP 8 cm, no thyromegaly or thyroid nodule.  Lungs: Clear to auscultation bilaterally with normal respiratory effort. CV: Nondisplaced PMI.  Heart regular S1/S2, no S3/S4, no murmur.  Trace ankle edema.  Abdomen: Soft, nontender, no hepatosplenomegaly, no distention.  Skin: Intact without lesions or rashes.  Neurologic: Alert and oriented x 3.  Psych: Normal affect. Extremities: No clubbing or cyanosis.  HEENT: Normal.   Continue midodrine, abdominal binder, and compression stockings for orthostasis, seems improved today.   Will need to eventually restart diuretic.  If creatinine is trending down tomorrow morning, restart torsemide at 20 mg daily.   Hopefully to CIR soon.   Loralie Champagne 02/14/2022 12:14 PM

## 2022-02-14 NOTE — Progress Notes (Signed)
Orthopedic Tech Progress Note ?Patient Details:  ?[name] ?[birth year] ?[id number] ? ?Casting ?Type of Cast: Short arm cast ?Cast Location: RUE ?Cast Material: Fiberglass ?Cast Intervention: Application ? ?  ? ? ?Litzy Dicker A [name] ?02/14/2022, 1:40 PM ? ?

## 2022-02-14 NOTE — Progress Notes (Signed)
Mobility Specialist Progress Note: ? ? 02/14/22 1500  ?Mobility  ?Activity Transferred to/from [facility]  ?Level of Assistance Minimal assist, patient does 75% or more ?(+2)  ?Assistive Device None  ?RUE Weight Bearing PWB  ?Distance Ambulated (ft) 2 ft  ?Activity Response Tolerated well  ?$Mobility charge 1 Mobility  ? ?Pt requesting to sit on BSC. Required minA +2 to stand and take steps to [facility] Pt left with call bell, will f/t to get pt back to bed.  ? ?Nelta Numbers ?Acute Rehab ?Phone: [phone] ?Office Phone: [phone] ? ?

## 2022-02-14 NOTE — Progress Notes (Signed)
PROGRESS NOTE    [name] Pro  VPX:[id number] DOB: December 04, 1946 DOA: 02/03/2022 PCP: [name], DO  Narrative 71/F with history of chronic diastolic CHF, pulmonary hypertension, CVA, chronic Thromboembolic disease, dyslipidemia was admitted to cardiology service on 2/20 with acute on chronic diastolic CHF, ? cardiac amyloidosis, diuresed with IV Lasix, hospitalization complicated by acute kidney injury and orthostatic hypotension, subsequently developed vertigo, nausea vomiting as well and abdominal pain, constipation -Then developed acute kidney injury   Subjective: -Feels tired and sleepy, breathing okay, denies any nausea or vomiting, complains about her right forearm splint being extremely loose  Assessment and Plan:  * Acute on chronic diastolic CHF  -2D echo noted EF of 55-60%, preserved RV systolic function  -Cardiolite this admission noted preserved EF, no ischemia -PYP scan not suggestive of cardiac amyloidosis, MRI with delayed enhancement in basal inferior wall and septum epicardial pattern, increased extracellular volume percent-45% suggestive of amyloidosis, SPEP + for free lamba light chain will check ratio -Diuresed with IV Lasix earlier this admission -She is 6 L negative, had transitioned to oral torsemide, now diuretics on hold given worsening AKI -Given gentle IVF yesterday X 500 mL -Heart failure team following -Discharge planning as well, plan for transfer to CIR in 1 to 2 days if creatinine stable/improving  AKi on CKD3a -Baseline creatinine around 1.4, creatinine peaked at  2.8 yesterday -Likely multifactorial, from cardiorenal, contrast for CTPA 2/21 and also was on Bactrim previously -Holding diuretics  -Avoid hypotension, continue midodrine dose increased -BMP in a.m.  Sinus tachycardia -Improved after restarting bisoprolol and holding Adderall  Dizziness, orthostatic hypotension -See discussion above  Urinary retention -Resolved, continue bethanechol  today  Yeast in urine -Initially suspected to be colonization, now with dysuria, will discuss with Pharm D for antifungal recommendations with her GFR  Right wrist fracture -Seen by Dr. Grandville Silos few weeks ago, splint felt to be loose, will discuss with Ortho  Constipation -Resolved, continue bowel regimen  Class 1 obesity-  Calculated BMI is 30.5   Essential hypertension-  Continue with midodrine   Pulmonary hypertension -Continue O2, diuretics as above  Hyperlipidemia-  Continue with statin therapy.   DVT prophylaxis: Heparin subcutaneous Code Status: Full code Family Communication: Did spouse yesterday Disposition Plan: CIR in 24-48 hours if stable   Consultants:  Heart failure team  Procedures:   Antimicrobials:    Objective: Vitals:   02/14/22 0422 02/14/22 0935 02/14/22 0944 02/14/22 1051  BP: (!) 106/59 (!) 96/58  (!) 101/53  Pulse:  84    Resp: 14 13 16 15   Temp: 97.8 F (36.6 C) 98 F (36.7 C)    TempSrc: Oral     SpO2: 94% 93%    Weight:      Height:        Intake/Output Summary (Last 24 hours) at 02/14/2022 1144 Last data filed at 02/14/2022 0945 Gross per 24 hour  Intake 1318.69 ml  Output 1150 ml  Net 168.69 ml   Filed Weights   02/12/22 0335 02/13/22 0243 02/14/22 0100  Weight: 84.6 kg 83.6 kg 82.1 kg    Examination:  General exam: Pleasant elderly female laying in bed, AAOx3, no distress HEENT: No JVD CVS: S1-S2, regular rhythm, systolic murmur Lungs: Decreased breath sounds to bases Abdomen: Soft, nontender, bowel sounds present Extremities: No edema, Unna boots on Skin: No rashes on exposed skin Psychiatry: Mood & affect appropriate.     Data Reviewed:   CBC: Recent Labs  Lab 02/09/22 1030 02/10/22 0811 02/13/22  5638 02/14/22 0340  WBC 6.4 8.5 6.7 6.4  NEUTROABS 4.2  --   --   --   HGB 10.4* 10.6* 12.1 11.0*  HCT 31.7* 33.3* 38.8 34.3*  MCV 101.3* 99.1 101.6* 99.7  PLT 303 337 311 937   Basic Metabolic  Panel: Recent Labs  Lab 02/10/22 0811 02/11/22 0301 02/12/22 0351 02/13/22 0218 02/14/22 0340  NA 141 143 140 140 139  K 4.2 4.1 4.1 4.1 3.8  CL 100 102 97* 97* 96*  CO2 29 26 31 28 30   GLUCOSE 92 80 100* 99 85  BUN 45* 44* 48* 60* 65*  CREATININE 2.39* 1.92* 2.18* 2.86* 2.53*  CALCIUM 9.0 9.4 9.4 9.2 9.0  MG  --  2.4  --  2.4  --    GFR: Estimated Creatinine Clearance: 21.5 mL/min (A) (by C-G formula based on SCr of 2.53 mg/dL (H)). Liver Function Tests: Recent Labs  Lab 02/09/22 1030 02/10/22 0811 02/13/22 0218  AST 13* 15 21  ALT 16 15 15   ALKPHOS 99 106 103  BILITOT 0.7 0.4 0.7  PROT 6.7 6.5 6.8  ALBUMIN 2.7* 2.6* 2.9*   Recent Labs  Lab 02/09/22 1030  LIPASE 26  AMYLASE 18*   Recent Labs  Lab 02/10/22 0811  AMMONIA 21   Coagulation Profile: No results for input(s): INR, PROTIME in the last 168 hours. Cardiac Enzymes: No results for input(s): CKTOTAL, CKMB, CKMBINDEX, TROPONINI in the last 168 hours. BNP (last 3 results) No results for input(s): PROBNP in the last 8760 hours. HbA1C: No results for input(s): HGBA1C in the last 72 hours. CBG: No results for input(s): GLUCAP in the last 168 hours. Lipid Profile: No results for input(s): CHOL, HDL, LDLCALC, TRIG, CHOLHDL, LDLDIRECT in the last 72 hours. Thyroid Function Tests: No results for input(s): TSH, T4TOTAL, FREET4, T3FREE, THYROIDAB in the last 72 hours. Anemia Panel: No results for input(s): VITAMINB12, FOLATE, FERRITIN, TIBC, IRON, RETICCTPCT in the last 72 hours. Urine analysis:    Component Value Date/Time   COLORURINE YELLOW 02/13/2022 1615   APPEARANCEUR CLOUDY (A) 02/13/2022 1615   LABSPEC 1.015 02/13/2022 1615   PHURINE 5.0 02/13/2022 1615   GLUCOSEU NEGATIVE 02/13/2022 1615   HGBUR MODERATE (A) 02/13/2022 1615   BILIRUBINUR NEGATIVE 02/13/2022 1615   KETONESUR NEGATIVE 02/13/2022 1615   PROTEINUR 100 (A) 02/13/2022 1615   UROBILINOGEN 0.2 03/22/2015 1414   NITRITE NEGATIVE  02/13/2022 1615   LEUKOCYTESUR MODERATE (A) 02/13/2022 1615   Sepsis Labs: @LABRCNTIP (procalcitonin:4,lacticidven:4)  ) Recent Results (from the past 240 hour(s))  SARS Coronavirus 2 by RT PCR (hospital order, performed in [place] hospital lab) Nasopharyngeal Nasopharyngeal Swab     Status: None   Collection Time: 02/06/22  3:42 AM   Specimen: Nasopharyngeal Swab  Result Value Ref Range Status   SARS Coronavirus 2 NEGATIVE NEGATIVE Final    Comment: (NOTE) SARS-CoV-2 target nucleic acids are NOT DETECTED.  The SARS-CoV-2 RNA is generally detectable in upper and lower respiratory specimens during the acute phase of infection. The lowest concentration of SARS-CoV-2 viral copies this assay can detect is 250 copies / mL. A negative result does not preclude SARS-CoV-2 infection and should not be used as the sole basis for treatment or other patient management decisions.  A negative result may occur with improper specimen collection / handling, submission of specimen other than nasopharyngeal swab, presence of viral mutation(s) within the areas targeted by this assay, and inadequate number of viral copies (<250 copies / mL). A  negative result must be combined with clinical observations, patient history, and epidemiological information.  Fact Sheet for Patients:   StrictlyIdeas.no  Fact Sheet for Healthcare Providers: BankingDealers.co.za  This test is not yet approved or  cleared by the [place] FDA and has been authorized for detection and/or diagnosis of SARS-CoV-2 by FDA under an Emergency Use Authorization (EUA).  This EUA will remain in effect (meaning this test can be used) for the duration of the COVID-19 declaration under Section 564(b)(1) of the Act, 21 U.S.C. section 360bbb-3(b)(1), unless the authorization is terminated or revoked sooner.  Performed at [facility], [place] [address] [place],  [place] [address]   Urine Culture     Status: Abnormal   Collection Time: 02/10/22  2:07 PM   Specimen: In/Out Cath Urine  Result Value Ref Range Status   Specimen Description IN/OUT CATH URINE  Final   Special Requests   Final    NONE Performed at [facility], [place] [address], [place], [place] [address]    Culture >=100,000 COLONIES/mL YEAST (A)  Final   Report Status 02/12/2022 FINAL  Final  Urine Culture     Status: Abnormal   Collection Time: 02/13/22  4:15 PM   Specimen: Urine, Clean Catch  Result Value Ref Range Status   Specimen Description URINE, CLEAN CATCH  Final   Special Requests   Final    NONE Performed at [facility], [place] [address], [place], [place] [address]    Culture >=100,000 COLONIES/mL YEAST (A)  Final   Report Status 02/14/2022 FINAL  Final     Radiology Studies: No results found.   Scheduled Meds:  aspirin  81 mg Oral QHS   bethanechol  10 mg Oral TID   bisoprolol  2.5 mg Oral Daily   calcium carbonate  1 tablet Oral Q breakfast   chlorhexidine  15 mL Mouth Rinse BID   DULoxetine  60 mg Oral BID   gabapentin  100 mg Oral TID   heparin  5,000 Units Subcutaneous Q8H   HYDROcodone-acetaminophen  1 tablet Oral QID   meclizine  12.5 mg Oral TID   mouth rinse  15 mL Mouth Rinse q12n4p   midodrine  15 mg Oral TID WC   pantoprazole  40 mg Oral BID   polyethylene glycol  17 g Oral BID   rOPINIRole  1 mg Oral Daily   And   rOPINIRole  2 mg Oral BID   scopolamine  1 patch Transdermal Q72H   senna  1 tablet Oral Daily   sodium chloride flush  3 mL Intravenous Q12H   sucralfate  1 g Oral TID WC & HS   triamcinolone ointment  1 application Topical BID   Vitamin D (Ergocalciferol)  50,000 Units Oral Q7 days   Continuous Infusions:  sodium chloride Stopped (02/08/22 0155)     LOS: 11 days    Time spent: 76min  Domenic Polite, MD [facility]   02/14/2022, 11:44 AM

## 2022-02-14 NOTE — Progress Notes (Signed)
Inpatient Rehab Admissions Coordinator:  ? ? Pt. Is not medically ready for CIR at this time. I will follow for potential admit over the weekend.   ? ?Clemens Catholic, MS, CCC-SLP ?Rehab Admissions Coordinator  ?[phone] (celll) ?[phone] (office) ? ?

## 2022-02-14 NOTE — Plan of Care (Signed)
?  Problem: Education: ?Goal: Ability to demonstrate management of disease process will improve ?Outcome: Progressing ?  ?Problem: Clinical Measurements: ?Goal: Ability to maintain clinical measurements within normal limits will improve ?Outcome: Progressing ?  ?Problem: Activity: ?Goal: Risk for activity intolerance will decrease ?Outcome: Progressing ?  ?Problem: Clinical Measurements: ?Goal: Cardiovascular complication will be avoided ?Outcome: Progressing ?  ?

## 2022-02-14 NOTE — Progress Notes (Signed)
Patient urinating on [facility], says she's been holding it because it burns when she pees. ?

## 2022-02-15 ENCOUNTER — Inpatient Hospital Stay (HOSPITAL_COMMUNITY)
Admission: EM | Admit: 2022-02-15 | Discharge: 2022-03-04 | DRG: 945 | Disposition: A | Discharge status: Skilled Nursing Facility | Payer: Medicare Other | Source: Intra-hospital | Attending: Physical Medicine and Rehabilitation | Admitting: Physical Medicine and Rehabilitation

## 2022-02-15 ENCOUNTER — Other Ambulatory Visit: Payer: Self-pay

## 2022-02-15 ENCOUNTER — Encounter (HOSPITAL_COMMUNITY): Payer: Self-pay | Admitting: Physical Medicine and Rehabilitation

## 2022-02-15 DIAGNOSIS — W19XXXD Unspecified fall, subsequent encounter: Secondary | ICD-10-CM | POA: Diagnosis present

## 2022-02-15 DIAGNOSIS — S52501B Unspecified fracture of the lower end of right radius, initial encounter for open fracture type I or II: Secondary | ICD-10-CM | POA: Diagnosis not present

## 2022-02-15 DIAGNOSIS — K5901 Slow transit constipation: Secondary | ICD-10-CM | POA: Diagnosis present

## 2022-02-15 DIAGNOSIS — D649 Anemia, unspecified: Secondary | ICD-10-CM | POA: Diagnosis not present

## 2022-02-15 DIAGNOSIS — N179 Acute kidney failure, unspecified: Secondary | ICD-10-CM | POA: Diagnosis present

## 2022-02-15 DIAGNOSIS — M810 Age-related osteoporosis without current pathological fracture: Secondary | ICD-10-CM | POA: Diagnosis present

## 2022-02-15 DIAGNOSIS — G2581 Restless legs syndrome: Secondary | ICD-10-CM | POA: Diagnosis present

## 2022-02-15 DIAGNOSIS — S62607D Fracture of unspecified phalanx of left little finger, subsequent encounter for fracture with routine healing: Secondary | ICD-10-CM

## 2022-02-15 DIAGNOSIS — F32A Depression, unspecified: Secondary | ICD-10-CM | POA: Diagnosis not present

## 2022-02-15 DIAGNOSIS — G4730 Sleep apnea, unspecified: Secondary | ICD-10-CM | POA: Diagnosis present

## 2022-02-15 DIAGNOSIS — Z803 Family history of malignant neoplasm of breast: Secondary | ICD-10-CM

## 2022-02-15 DIAGNOSIS — I951 Orthostatic hypotension: Secondary | ICD-10-CM

## 2022-02-15 DIAGNOSIS — N184 Chronic kidney disease, stage 4 (severe): Secondary | ICD-10-CM | POA: Diagnosis present

## 2022-02-15 DIAGNOSIS — G9089 Other disorders of autonomic nervous system: Secondary | ICD-10-CM | POA: Diagnosis present

## 2022-02-15 DIAGNOSIS — S62101D Fracture of unspecified carpal bone, right wrist, subsequent encounter for fracture with routine healing: Secondary | ICD-10-CM

## 2022-02-15 DIAGNOSIS — Z20822 Contact with and (suspected) exposure to covid-19: Secondary | ICD-10-CM | POA: Diagnosis present

## 2022-02-15 DIAGNOSIS — I959 Hypotension, unspecified: Secondary | ICD-10-CM | POA: Diagnosis not present

## 2022-02-15 DIAGNOSIS — Z8673 Personal history of transient ischemic attack (TIA), and cerebral infarction without residual deficits: Secondary | ICD-10-CM

## 2022-02-15 DIAGNOSIS — G909 Disorder of the autonomic nervous system, unspecified: Secondary | ICD-10-CM | POA: Diagnosis present

## 2022-02-15 DIAGNOSIS — E669 Obesity, unspecified: Secondary | ICD-10-CM | POA: Diagnosis present

## 2022-02-15 DIAGNOSIS — H8110 Benign paroxysmal vertigo, unspecified ear: Secondary | ICD-10-CM | POA: Diagnosis present

## 2022-02-15 DIAGNOSIS — S81801S Unspecified open wound, right lower leg, sequela: Secondary | ICD-10-CM | POA: Diagnosis not present

## 2022-02-15 DIAGNOSIS — Z96652 Presence of left artificial knee joint: Secondary | ICD-10-CM | POA: Diagnosis present

## 2022-02-15 DIAGNOSIS — B49 Unspecified mycosis: Secondary | ICD-10-CM | POA: Diagnosis present

## 2022-02-15 DIAGNOSIS — I130 Hypertensive heart and chronic kidney disease with heart failure and stage 1 through stage 4 chronic kidney disease, or unspecified chronic kidney disease: Secondary | ICD-10-CM | POA: Diagnosis present

## 2022-02-15 DIAGNOSIS — I5033 Acute on chronic diastolic (congestive) heart failure: Secondary | ICD-10-CM | POA: Diagnosis present

## 2022-02-15 DIAGNOSIS — W19XXXA Unspecified fall, initial encounter: Secondary | ICD-10-CM | POA: Diagnosis not present

## 2022-02-15 DIAGNOSIS — Z9884 Bariatric surgery status: Secondary | ICD-10-CM | POA: Diagnosis not present

## 2022-02-15 DIAGNOSIS — M542 Cervicalgia: Secondary | ICD-10-CM | POA: Diagnosis present

## 2022-02-15 DIAGNOSIS — I6529 Occlusion and stenosis of unspecified carotid artery: Secondary | ICD-10-CM | POA: Diagnosis not present

## 2022-02-15 DIAGNOSIS — T24302S Burn of third degree of unspecified site of left lower limb, except ankle and foot, sequela: Secondary | ICD-10-CM

## 2022-02-15 DIAGNOSIS — X088XXS Exposure to other specified smoke, fire and flames, sequela: Secondary | ICD-10-CM | POA: Diagnosis present

## 2022-02-15 DIAGNOSIS — Z7401 Bed confinement status: Secondary | ICD-10-CM | POA: Diagnosis not present

## 2022-02-15 DIAGNOSIS — K219 Gastro-esophageal reflux disease without esophagitis: Secondary | ICD-10-CM | POA: Diagnosis present

## 2022-02-15 DIAGNOSIS — Z8249 Family history of ischemic heart disease and other diseases of the circulatory system: Secondary | ICD-10-CM

## 2022-02-15 DIAGNOSIS — Z79899 Other long term (current) drug therapy: Secondary | ICD-10-CM

## 2022-02-15 DIAGNOSIS — I480 Paroxysmal atrial fibrillation: Secondary | ICD-10-CM | POA: Diagnosis present

## 2022-02-15 DIAGNOSIS — R5381 Other malaise: Principal | ICD-10-CM | POA: Diagnosis present

## 2022-02-15 DIAGNOSIS — M545 Low back pain, unspecified: Secondary | ICD-10-CM | POA: Diagnosis present

## 2022-02-15 DIAGNOSIS — E785 Hyperlipidemia, unspecified: Secondary | ICD-10-CM | POA: Diagnosis present

## 2022-02-15 DIAGNOSIS — Z87898 Personal history of other specified conditions: Secondary | ICD-10-CM

## 2022-02-15 DIAGNOSIS — Z6827 Body mass index (BMI) 27.0-27.9, adult: Secondary | ICD-10-CM

## 2022-02-15 DIAGNOSIS — T148XXA Other injury of unspecified body region, initial encounter: Secondary | ICD-10-CM

## 2022-02-15 DIAGNOSIS — Z800 Family history of malignant neoplasm of digestive organs: Secondary | ICD-10-CM

## 2022-02-15 DIAGNOSIS — M1990 Unspecified osteoarthritis, unspecified site: Secondary | ICD-10-CM | POA: Diagnosis present

## 2022-02-15 DIAGNOSIS — D509 Iron deficiency anemia, unspecified: Secondary | ICD-10-CM | POA: Diagnosis present

## 2022-02-15 DIAGNOSIS — I509 Heart failure, unspecified: Secondary | ICD-10-CM | POA: Diagnosis not present

## 2022-02-15 DIAGNOSIS — R209 Unspecified disturbances of skin sensation: Secondary | ICD-10-CM | POA: Diagnosis present

## 2022-02-15 DIAGNOSIS — I272 Pulmonary hypertension, unspecified: Secondary | ICD-10-CM | POA: Diagnosis present

## 2022-02-15 DIAGNOSIS — R3129 Other microscopic hematuria: Secondary | ICD-10-CM

## 2022-02-15 DIAGNOSIS — N390 Urinary tract infection, site not specified: Secondary | ICD-10-CM

## 2022-02-15 DIAGNOSIS — S81802S Unspecified open wound, left lower leg, sequela: Secondary | ICD-10-CM | POA: Diagnosis not present

## 2022-02-15 DIAGNOSIS — Z96611 Presence of right artificial shoulder joint: Secondary | ICD-10-CM | POA: Diagnosis present

## 2022-02-15 DIAGNOSIS — M792 Neuralgia and neuritis, unspecified: Secondary | ICD-10-CM

## 2022-02-15 DIAGNOSIS — Z888 Allergy status to other drugs, medicaments and biological substances status: Secondary | ICD-10-CM

## 2022-02-15 DIAGNOSIS — T24301S Burn of third degree of unspecified site of right lower limb, except ankle and foot, sequela: Secondary | ICD-10-CM

## 2022-02-15 DIAGNOSIS — R339 Retention of urine, unspecified: Secondary | ICD-10-CM | POA: Diagnosis not present

## 2022-02-15 DIAGNOSIS — G609 Hereditary and idiopathic neuropathy, unspecified: Secondary | ICD-10-CM | POA: Diagnosis not present

## 2022-02-15 DIAGNOSIS — S52501A Unspecified fracture of the lower end of right radius, initial encounter for closed fracture: Secondary | ICD-10-CM | POA: Diagnosis not present

## 2022-02-15 DIAGNOSIS — Z7982 Long term (current) use of aspirin: Secondary | ICD-10-CM

## 2022-02-15 DIAGNOSIS — D508 Other iron deficiency anemias: Secondary | ICD-10-CM | POA: Diagnosis present

## 2022-02-15 DIAGNOSIS — G629 Polyneuropathy, unspecified: Secondary | ICD-10-CM

## 2022-02-15 DIAGNOSIS — G894 Chronic pain syndrome: Secondary | ICD-10-CM

## 2022-02-15 DIAGNOSIS — Z9181 History of falling: Secondary | ICD-10-CM | POA: Diagnosis not present

## 2022-02-15 DIAGNOSIS — H53419 Scotoma involving central area, unspecified eye: Secondary | ICD-10-CM | POA: Diagnosis not present

## 2022-02-15 LAB — BASIC METABOLIC PANEL
Anion gap: 14 (ref 5–15)
BUN: 63 mg/dL — ABNORMAL HIGH (ref 8–23)
CO2: 26 mmol/L (ref 22–32)
Calcium: 9.1 mg/dL (ref 8.9–10.3)
Chloride: 96 mmol/L — ABNORMAL LOW (ref 98–111)
Creatinine, Ser: 2.34 mg/dL — ABNORMAL HIGH (ref 0.44–1.00)
GFR, Estimated: 21 mL/min — ABNORMAL LOW (ref >60)
Glucose, Bld: 88 mg/dL (ref 70–99)
Potassium: 4 mmol/L (ref 3.5–5.1)
Sodium: 136 mmol/L (ref 135–145)

## 2022-02-15 LAB — CBC
HCT: 34.4 % — ABNORMAL LOW (ref 36.0–46.0)
Hemoglobin: 10.9 g/dL — ABNORMAL LOW (ref 12.0–15.0)
MCH: 31.7 pg (ref 26.0–34.0)
MCHC: 31.7 g/dL (ref 30.0–36.0)
MCV: 100 fL (ref 80.0–100.0)
Platelets: 253 10*3/uL (ref 150–400)
RBC: 3.44 MIL/uL — ABNORMAL LOW (ref 3.87–5.11)
RDW: 15 % (ref 11.5–15.5)
WBC: 5.3 10*3/uL (ref 4.0–10.5)
nRBC: 0 % (ref 0.0–0.2)

## 2022-02-15 MED ORDER — SENNA 8.6 MG PO TABS: 1.0000 | ORAL_TABLET | Freq: Every day | ORAL | 0 refills | Status: DC

## 2022-02-15 MED ORDER — ROPINIROLE HCL 1 MG PO TABS: ORAL_TABLET | ORAL | 3 refills | Status: DC

## 2022-02-15 MED ORDER — ROPINIROLE HCL 1 MG PO TABS
1.0000 mg | ORAL_TABLET | Freq: Every day | ORAL | Status: DC
Start: 2022-02-16 — End: 2022-02-26
  Administered 2022-02-16 – 2022-02-25 (×10): 1 mg via ORAL
  Filled 2022-02-15 (×11): qty 1

## 2022-02-15 MED ORDER — BETHANECHOL CHLORIDE 5 MG PO TABS
5.0000 mg | ORAL_TABLET | Freq: Three times a day (TID) | ORAL | Status: DC
  Administered 2022-02-15: 5 mg via ORAL
  Filled 2022-02-15 (×3): qty 1

## 2022-02-15 MED ORDER — BISOPROLOL FUMARATE 5 MG PO TABS: 2.5000 mg | ORAL_TABLET | Freq: Every evening | ORAL | Status: DC

## 2022-02-15 MED ORDER — FLUCONAZOLE 100 MG PO TABS
100.0000 mg | ORAL_TABLET | Freq: Every day | ORAL | Status: AC
  Administered 2022-02-16 – 2022-02-20 (×5): 100 mg via ORAL
  Filled 2022-02-15 (×5): qty 1

## 2022-02-15 MED ORDER — CALCIUM CARBONATE 1250 (500 CA) MG PO TABS
1.0000 | ORAL_TABLET | Freq: Every day | ORAL | Status: DC
  Administered 2022-02-16 – 2022-03-04 (×17): 500 mg via ORAL
  Filled 2022-02-15 (×17): qty 1

## 2022-02-15 MED ORDER — ALBUTEROL SULFATE (2.5 MG/3ML) 0.083% IN NEBU
2.5000 mg | INHALATION_SOLUTION | RESPIRATORY_TRACT | Status: DC | PRN
  Administered 2022-02-17: 2.5 mg via RESPIRATORY_TRACT
  Filled 2022-02-15: qty 3

## 2022-02-15 MED ORDER — BENZOCAINE 10 % MT GEL
Freq: Four times a day (QID) | OROMUCOSAL | Status: DC | PRN
  Filled 2022-02-15: qty 9

## 2022-02-15 MED ORDER — BISOPROLOL FUMARATE 5 MG PO TABS
2.5000 mg | ORAL_TABLET | Freq: Every day | ORAL | Status: DC
  Administered 2022-02-16 – 2022-02-18 (×3): 2.5 mg via ORAL
  Filled 2022-02-15 (×3): qty 0.5

## 2022-02-15 MED ORDER — ONDANSETRON HCL 4 MG/2ML IJ SOLN
4.0000 mg | INTRAMUSCULAR | Status: DC | PRN
  Administered 2022-02-22: 4 mg via INTRAVENOUS
  Filled 2022-02-15: qty 2

## 2022-02-15 MED ORDER — TRIAMCINOLONE ACETONIDE 0.1 % EX OINT
1.0000 "application " | TOPICAL_OINTMENT | Freq: Two times a day (BID) | CUTANEOUS | Status: DC
  Administered 2022-02-17 – 2022-03-04 (×21): 1 via TOPICAL
  Filled 2022-02-15: qty 15

## 2022-02-15 MED ORDER — METHOCARBAMOL 500 MG PO TABS
500.0000 mg | ORAL_TABLET | Freq: Four times a day (QID) | ORAL | Status: DC | PRN
  Administered 2022-02-17: 500 mg via ORAL
  Filled 2022-02-15: qty 1

## 2022-02-15 MED ORDER — ASPIRIN 81 MG PO CHEW
81.0000 mg | CHEWABLE_TABLET | Freq: Every day | ORAL | Status: DC
  Administered 2022-02-15 – 2022-03-03 (×17): 81 mg via ORAL
  Filled 2022-02-15 (×17): qty 1

## 2022-02-15 MED ORDER — SUCRALFATE 1 GM/10ML PO SUSP
1.0000 g | Freq: Three times a day (TID) | ORAL | Status: DC
  Administered 2022-02-15 – 2022-03-04 (×66): 1 g via ORAL
  Filled 2022-02-15 (×71): qty 10

## 2022-02-15 MED ORDER — HEPARIN SODIUM (PORCINE) 5000 UNIT/ML IJ SOLN
5000.0000 [IU] | Freq: Three times a day (TID) | INTRAMUSCULAR | Status: DC
  Administered 2022-02-15 – 2022-03-04 (×50): 5000 [IU] via SUBCUTANEOUS
  Filled 2022-02-15 (×48): qty 1

## 2022-02-15 MED ORDER — MIDODRINE HCL 5 MG PO TABS
15.0000 mg | ORAL_TABLET | Freq: Three times a day (TID) | ORAL | Status: DC
  Administered 2022-02-15 – 2022-03-04 (×51): 15 mg via ORAL
  Filled 2022-02-15 (×51): qty 3

## 2022-02-15 MED ORDER — MIDODRINE HCL 5 MG PO TABS: 15.0000 mg | ORAL_TABLET | Freq: Three times a day (TID) | ORAL | Status: DC

## 2022-02-15 MED ORDER — GABAPENTIN 100 MG PO CAPS
200.0000 mg | ORAL_CAPSULE | Freq: Three times a day (TID) | ORAL | Status: DC
  Administered 2022-02-15 – 2022-02-20 (×14): 200 mg via ORAL
  Filled 2022-02-15 (×13): qty 2

## 2022-02-15 MED ORDER — ACETAMINOPHEN 325 MG PO TABS
650.0000 mg | ORAL_TABLET | ORAL | Status: DC | PRN
  Administered 2022-02-25 – 2022-03-04 (×8): 650 mg via ORAL
  Filled 2022-02-15 (×8): qty 2

## 2022-02-15 MED ORDER — GABAPENTIN 100 MG PO CAPS: 100.0000 mg | ORAL_CAPSULE | Freq: Two times a day (BID) | ORAL | Status: DC

## 2022-02-15 MED ORDER — ROPINIROLE HCL 1 MG PO TABS
1.0000 mg | ORAL_TABLET | Freq: Every day | ORAL | Status: DC
  Filled 2022-02-15: qty 1

## 2022-02-15 MED ORDER — HYDROCODONE-ACETAMINOPHEN 7.5-325 MG PO TABS
1.0000 | ORAL_TABLET | Freq: Four times a day (QID) | ORAL | Status: DC
Start: 2022-02-15 — End: 2022-03-04
  Administered 2022-02-15 – 2022-03-04 (×65): 1 via ORAL
  Filled 2022-02-15 (×67): qty 1

## 2022-02-15 MED ORDER — BETHANECHOL CHLORIDE 5 MG PO TABS: 5.0000 mg | ORAL_TABLET | Freq: Three times a day (TID) | ORAL | 0 refills | Status: DC

## 2022-02-15 MED ORDER — BETHANECHOL CHLORIDE 10 MG PO TABS
5.0000 mg | ORAL_TABLET | Freq: Three times a day (TID) | ORAL | Status: AC
  Administered 2022-02-15 – 2022-02-16 (×3): 5 mg via ORAL
  Filled 2022-02-15 (×3): qty 1

## 2022-02-15 MED ORDER — TORSEMIDE 20 MG PO TABS
20.0000 mg | ORAL_TABLET | Freq: Every day | ORAL | Status: DC
Start: 2022-02-16 — End: 2022-02-17
  Administered 2022-02-16 – 2022-02-17 (×2): 20 mg via ORAL
  Filled 2022-02-15 (×2): qty 1

## 2022-02-15 MED ORDER — POLYETHYLENE GLYCOL 3350 17 G PO PACK
17.0000 g | PACK | Freq: Two times a day (BID) | ORAL | Status: DC
  Administered 2022-02-15 – 2022-02-19 (×7): 17 g via ORAL
  Filled 2022-02-15 (×9): qty 1

## 2022-02-15 MED ORDER — DULOXETINE HCL 30 MG PO CPEP
60.0000 mg | ORAL_CAPSULE | Freq: Two times a day (BID) | ORAL | Status: DC
  Administered 2022-02-15 – 2022-03-04 (×34): 60 mg via ORAL
  Filled 2022-02-15 (×34): qty 2

## 2022-02-15 MED ORDER — TORSEMIDE 20 MG PO TABS
20.0000 mg | ORAL_TABLET | Freq: Every day | ORAL | Status: DC
  Administered 2022-02-15: 20 mg via ORAL
  Filled 2022-02-15: qty 1

## 2022-02-15 MED ORDER — VITAMIN D (ERGOCALCIFEROL) 1.25 MG (50000 UNIT) PO CAPS
50000.0000 [IU] | ORAL_CAPSULE | ORAL | Status: DC
  Administered 2022-02-16 – 2022-03-02 (×3): 50000 [IU] via ORAL
  Filled 2022-02-15 (×3): qty 1

## 2022-02-15 MED ORDER — ROPINIROLE HCL 1 MG PO TABS
1.0000 mg | ORAL_TABLET | Freq: Every day | ORAL | Status: DC
Start: 2022-02-15 — End: 2022-02-26
  Administered 2022-02-15 – 2022-02-25 (×11): 1 mg via ORAL
  Filled 2022-02-15 (×11): qty 1

## 2022-02-15 MED ORDER — GABAPENTIN 100 MG PO CAPS
100.0000 mg | ORAL_CAPSULE | Freq: Three times a day (TID) | ORAL | Status: DC
  Administered 2022-02-15: 100 mg via ORAL
  Filled 2022-02-15: qty 1

## 2022-02-15 MED ORDER — SENNA 8.6 MG PO TABS
1.0000 | ORAL_TABLET | Freq: Every day | ORAL | Status: DC
  Administered 2022-02-16 – 2022-03-03 (×16): 8.6 mg via ORAL
  Filled 2022-02-15 (×17): qty 1

## 2022-02-15 MED ORDER — ROPINIROLE HCL 1 MG PO TABS: 1.0000 mg | ORAL_TABLET | Freq: Every day | ORAL | Status: DC

## 2022-02-15 MED ORDER — PANTOPRAZOLE SODIUM 40 MG PO TBEC
40.0000 mg | DELAYED_RELEASE_TABLET | Freq: Two times a day (BID) | ORAL | Status: DC
  Administered 2022-02-15 – 2022-03-04 (×34): 40 mg via ORAL
  Filled 2022-02-15 (×34): qty 1

## 2022-02-15 MED ORDER — TRAZODONE HCL 50 MG PO TABS
25.0000 mg | ORAL_TABLET | Freq: Every evening | ORAL | Status: DC | PRN
  Administered 2022-02-17: 50 mg via ORAL
  Filled 2022-02-15: qty 1

## 2022-02-15 MED ORDER — MECLIZINE HCL 25 MG PO TABS
12.5000 mg | ORAL_TABLET | Freq: Three times a day (TID) | ORAL | Status: DC
  Administered 2022-02-15 – 2022-03-04 (×52): 12.5 mg via ORAL
  Filled 2022-02-15 (×40): qty 1
  Filled 2022-02-15: qty 0.5
  Filled 2022-02-15 (×10): qty 1

## 2022-02-15 MED ORDER — FLUCONAZOLE 100 MG PO TABS: 100.0000 mg | ORAL_TABLET | Freq: Every day | ORAL | 0 refills | Status: DC

## 2022-02-15 MED ORDER — TORSEMIDE 20 MG PO TABS: 20.0000 mg | ORAL_TABLET | Freq: Every day | ORAL | Status: DC

## 2022-02-15 NOTE — Progress Notes (Signed)
Report given to Rehab nurse. All questions were answered.  ?

## 2022-02-15 NOTE — Progress Notes (Signed)
Pt is currently not able to urinate on her own. Bladder scan showed 413 ml and straight cath with 600 ml removed. Patient tolerated procedure well. Denies any pain at the moment. Will continue to monitor patient closely. ?
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
## 2022-02-15 NOTE — Progress Notes (Signed)
Inpatient Rehabilitation Admission Medication Review by a Pharmacist ? ?A complete drug regimen review was completed for this patient to identify any potential clinically significant medication issues. ? ?High Risk Drug Classes Is patient taking? Indication by Medication  ?Antipsychotic No   ?Anticoagulant Yes Heparin for DVT px  ?Antibiotic Yes Fluconazole for yeast infection  ?Opioid Yes Vicodin for pain  ?Antiplatelet Yes ASA - CVA  ?Hypoglycemics/insulin No   ?Vasoactive Medication Yes Bisoprolol - HTN/CHF  ?Chemotherapy No   ?Other Yes Ropinorole - RLA ?Protonix - GERD ?Midodrine - hypotension ?Bethanacol - urinary retention ?Vit D - deficiency ?Cymbalta - depression ?Gabapentin - neuropathy  ? ? ? ?Type of Medication Issue Identified Description of Issue Recommendation(s)  ?Drug Interaction(s) (clinically significant) ?    ?Duplicate Therapy ?    ?Allergy ?    ?No Medication Administration End Date ?    ?Incorrect Dose ?    ?Additional Drug Therapy Needed ? Aimovig, diclofenac sodium 1 % Gel, torsemide  Hold torsemide per Cards ?Resume other meds as needed per CIR  ?Significant med changes from prior encounter (inform family/care partners about these prior to discharge).    ?Other ?    ? ? ?Clinically significant medication issues were identified that warrant physician communication and completion of prescribed/recommended actions by midnight of the next day:  No ? ?Name of provider notified for urgent issues identified:  ? ?Provider Method of Notification:  ? ? ? ?Pharmacist comments:  ? ?Time spent performing this drug regimen review (minutes):  10 ? ? ? ?
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
## 2022-02-15 NOTE — Progress Notes (Signed)
Inpatient Rehab Admissions Coordinator:  ° °I have a CIR bed for this Pt. Today. RN may call report to [phone]. ° °Kanasia Gayman, MS, CCC-SLP °Rehab Admissions Coordinator  °[phone] (celll) °[phone] (office) ° °

## 2022-02-15 NOTE — Discharge Summary (Signed)
Physician Discharge Summary  My Rinke Blankenbaker TWS:[id number] DOB: 12-12-1946 DOA: 02/03/2022  PCP: [name], DO  Admit date: 02/03/2022 Discharge date: 02/15/2022  Time spent: 45 minutes  Recommendations for Outpatient Follow-up:  [facility]IR for short-term rehab Close follow-up with heart failure team Dr. Aundra Dubin in 1 to 2 weeks Labs BMP in 2 days Follow-up with Dr. France Ravens surgery in 2 to 3 weeks Hematology referral for abnormal SPEP  Discharge Diagnoses:  Principal Problem:   Acute on chronic diastolic CHF (congestive heart failure) (HCC) Orthostatic hypotension Autonomic neuropathy   Hyperlipidemia   Other secondary pulmonary hypertension (HCC)   Paroxysmal atrial fibrillation (HCC)   Essential hypertension   Class 1 obesity   Vertigo   Acute kidney injury superimposed on chronic kidney disease ([place]) Abnormal SPEP  Discharge Condition: Stable  Diet recommendation: Low-sodium, diabetic  Filed Weights   02/13/22 0243 02/14/22 0100 02/15/22 0407  Weight: 83.6 kg 82.1 kg 82.5 kg    History of present illness:  76/F with history of chronic diastolic CHF, pulmonary hypertension, CVA, chronic Thromboembolic disease, dyslipidemia, recent right wrist fracture in a cast was admitted to cardiology service on 2/20 with acute on chronic diastolic CHF, ? cardiac amyloidosis, diuresed with IV Lasix, hospitalization complicated by acute kidney injury and orthostatic hypotension, subsequently developed vertigo, nausea vomiting as well and abdominal pain, constipation -Then developed acute kidney injury and candiduria  Hospital Course:   Acute on chronic diastolic CHF  -Admitted with volume overload -2D echo noted EF of 55-60%, preserved RV systolic function  -Cardiolite this admission noted preserved EF, no ischemia -PYP scan not suggestive of cardiac amyloidosis, MRI with delayed enhancement in basal inferior wall and septum epicardial pattern, increased extracellular volume  percent-45% suggestive of amyloidosis, SPEP + for free lamba light chain, both kappa and lambda free light chains elevated, ratio is low-not suggestive of myeloma -Diuresed with IV Lasix earlier this admission -She is 7 L negative, had transitioned to oral torsemide, then diuretics held with worsening AKI -Now creatinine improving, appears close to euvolemic, restarted on torsemide 20 Mg daily -Needs BMP checked in 1 to 2 days, close follow-up with heart failure team Dr. Leeanne Rio on CKD3a -Baseline creatinine around 1.4, creatinine peaked at  2.8  -Likely multifactorial, from cardiorenal, contrast for CTPA 2/21 and also was on Bactrim previously -Diuretics held for 2 days, midodrine dose was increased -Creatinine improving, down to 2.3 today, discussed with heart failure team will restart torsemide 20 Mg daily, needs BMP checked in 1 to 2 days   Sinus tachycardia -Improved after restarting bisoprolol at low-dose and holding Adderall   Dizziness, orthostatic hypotension -See discussion above   Urinary retention -Intermittent, required in and out cath yesterday x1, continue bethanechol today   Yeast in urine -Initially suspected to be colonization then developed dysuria, started fluconazole, continue 100 Mg daily day 2/7, discontinue after 5 more days   Right wrist fracture -Seen by Dr. Grandville Silos few weeks ago, placed in a cast, this cast had gotten loose, discussed with Ortho and cast replaced yesterday 3/3, placed a splint yesterday for fracture of proximal phalanx of fifth digit left hand, follow-up with orthopedics   Constipation -Resolved, continue bowel regimen   Class 1 obesity-  Calculated BMI is 30.5    Essential hypertension-  Continue with midodrine    Pulmonary hypertension -Continue O2, diuretics as above   Hyperlipidemia-  Continue with statin therapy.   Discharge Exam: Vitals:   02/14/22 1953 02/15/22 0407  BP:    Pulse:    Resp:    Temp: 97.8 F (36.6  C) 98.6 F (37 C)  SpO2: 93% 95%   General exam: Pleasant elderly female laying in bed, AAOx3, no distress HEENT: No JVD CVS: S1-S2, regular rhythm, systolic murmur Lungs: Decreased breath sounds to bases Abdomen: Soft, nontender, bowel sounds present Extremities: No edema, Unna boots o, left arm with splint on 2 fingers, right forearm in a cast Skin: No rashes on exposed skin Psychiatry: Mood & affect appropriate  Discharge Instructions    Allergies as of 02/15/2022       Reactions   Atorvastatin    Other reaction(s): myalgia   Crestor [rosuvastatin]    Other reaction(s): myalgia   Ezetimibe    Other reaction(s): myalgia   Amitriptyline Rash        Medication List     STOP taking these medications    amphetamine-dextroamphetamine 10 MG tablet Commonly known as: ADDERALL   baclofen 10 MG tablet Commonly known as: LIORESAL   Baclofen 5 MG Tabs   furosemide 20 MG tablet Commonly known as: LASIX   quiNINE 324 MG capsule Commonly known as: QUALAQUIN   Repatha SureClick 161 MG/ML Soaj Generic drug: Evolocumab   sulfamethoxazole-trimethoprim 800-160 MG tablet Commonly known as: BACTRIM DS       TAKE these medications    Aimovig 140 MG/ML Soaj Generic drug: Erenumab-aooe Inject 140 mg into the skin every 30 (thirty) days.   ASPIRIN 81 PO Take 81 mg by mouth at bedtime.   bethanechol 5 MG tablet Commonly known as: URECHOLINE Take 1 tablet (5 mg total) by mouth 3 (three) times daily for 2 days.   bisoprolol 5 MG tablet Commonly known as: ZEBETA Take 0.5 tablets (2.5 mg total) by mouth every evening. What changed:  medication strength how much to take   calcium carbonate 1250 (500 Ca) MG chewable tablet Commonly known as: OS-CAL Chew 1 tablet by mouth daily. Not sure of dosage   diclofenac sodium 1 % Gel Commonly known as: Voltaren Apply 4 g topically 4 (four) times daily. What changed:  when to take this reasons to take this   DULoxetine  60 MG capsule Commonly known as: CYMBALTA Take 60 mg by mouth 2 (two) times daily.   esomeprazole 40 MG capsule Commonly known as: NEXIUM Take 40 mg by mouth 2 (two) times daily before a meal.   fluconazole 100 MG tablet Commonly known as: DIFLUCAN Take 1 tablet (100 mg total) by mouth daily for 5 days. Start taking on: February 16, 2022   gabapentin 100 MG capsule Commonly known as: NEURONTIN Take 1 capsule (100 mg total) by mouth 2 (two) times daily. What changed:  medication strength how much to take when to take this   HYDROcodone-acetaminophen 10-325 MG tablet Commonly known as: NORCO Take 1-2 tablets by mouth every 4 (four) hours as needed for moderate pain or severe pain. Takes for migraines What changed:  how much to take when to take this additional instructions   midodrine 5 MG tablet Commonly known as: PROAMATINE Take 3 tablets (15 mg total) by mouth 3 (three) times daily with meals.   ondansetron 4 MG disintegrating tablet Commonly known as: ZOFRAN-ODT DISSOLVE 1 TABLET(4 MG) ON THE TONGUE EVERY 8 HOURS AS NEEDED FOR NAUSEA OR VOMITING What changed:  how much to take how to take this when to take this reasons to take this additional instructions   ProAir HFA 108 (90 Base) MCG/ACT  inhaler Generic drug: albuterol Inhale 1 puff into the lungs every 6 (six) hours as needed for shortness of breath.   rOPINIRole 1 MG tablet Commonly known as: REQUIP 1 tablet in the morning, 1 tab in the evening What changed: additional instructions   senna 8.6 MG Tabs tablet Commonly known as: SENOKOT Take 1 tablet (8.6 mg total) by mouth daily. Start taking on: February 16, 2022   torsemide 20 MG tablet Commonly known as: DEMADEX Take 1 tablet (20 mg total) by mouth daily.   triamcinolone ointment 0.1 % Commonly known as: KENALOG Apply topically 2 (two) times daily.   Vitamin D (Ergocalciferol) 1.25 MG (50000 UNIT) Caps capsule Commonly known as: DRISDOL Take 1  capsule (50,000 Units total) by mouth every 7 (seven) days.       Allergies  Allergen Reactions   Atorvastatin     Other reaction(s): myalgia   Crestor [Rosuvastatin]     Other reaction(s): myalgia   Ezetimibe     Other reaction(s): myalgia   Amitriptyline Rash      The results of significant diagnostics from this hospitalization (including imaging, microbiology, ancillary and laboratory) are listed below for reference.    Significant Diagnostic Studies: CT ABDOMEN PELVIS WO CONTRAST  Result Date: 02/10/2022 CLINICAL DATA:  Nausea and vomiting. Abdominal pain. KUB with moderate-to-large stool burden. Ultrasound abdomen with gallstones but no evidence of cholecystitis. Heterogeneous echogenicity of liver. EXAM: CT ABDOMEN AND PELVIS WITHOUT CONTRAST TECHNIQUE: Multidetector CT imaging of the abdomen and pelvis was performed following the standard protocol without IV contrast. RADIATION DOSE REDUCTION: This exam was performed according to the departmental dose-optimization program which includes automated exposure control, adjustment of the mA and/or kV according to patient size and/or use of iterative reconstruction technique. COMPARISON:  Ultrasound right upper quadrant 02/09/2022; KUB 02/09/2022; CT abdomen and pelvis with contrast 10/16/2021 FINDINGS: Lower chest: Small left-greater-than-right pleural effusions new from 10/16/2021 prior CT. Associated subsegmental atelectasis. Heart size is moderately to markedly enlarged. Lack of intra-articular fluid limits evaluation of the abdominal and pelvic organ parenchyma. The following findings are made within this limitation. Hepatobiliary: Smooth liver contours. No gross liver lesion is seen. The gallbladder is moderately distended. There are layering gallstones corresponding to the gallstones seen on recent ultrasound. No intrahepatic or extrahepatic biliary ductal dilatation. Pancreas: Grossly unremarkable. No gross pancreatic ductal dilatation  or surrounding inflammatory changes. Spleen: There is a 12 mm low-density lesion within the lateral aspect of the spleen, unchanged from prior. This is nonspecific but differential considerations include a cyst or hemangioma. Adrenals/Urinary Tract: Normal adrenals. No renal stone or hydronephrosis. Lateral right upper pole fluid density 2.3 cm cyst as seen on recent ultrasound. Left upper pole low-density lesion measuring up to 12 mm too small to further characterize. This is grossly unchanged from 10/16/2021. The urinary bladder is moderately to markedly distended. No gross urinary bladder wall thickening is seen. No stone is seen within either ureter. There is mild fullness of the bilateral renal pelves and ureters likely due to the bladder distention. Stomach/Bowel: Mild sigmoid diverticulosis. Stool is seen throughout all segments of the colon compatible with constipation. The terminal ileum is unremarkable. There is a tiny thin-walled possible appendix seen on coronal series 6, image 27 similar to prior. This is favored to represent the patient's normal appendix possibly mildly fall against itself but without definite inflammatory change. Postsurgical changes are again seen of surgical suture within the stomach and probable gastric bypass. Interval decrease in chronic mild dilatation at  the small bowel anastomosis with internal air-fluid level. No dilated loops of bowel to indicate bowel obstruction. Vascular/Lymphatic: No abdominal aortic aneurysm. Mild atherosclerotic calcifications. No enlarged abdominal or pelvic lymph nodes. Reproductive: The uterus is present and grossly unremarkable. No adnexal masses. Other: There again ventral lower abdominal hernia mesh coils. No abdominal wall hernia. No free air or free fluid. Musculoskeletal: Chronic bilateral L5 pars defects. 10 mm grade 2 anterolisthesis of L5 on S1 unchanged. Severe L5-S1 disc space narrowing. Moderate to high-grade anterior bridging  osteophytes throughout the lower thoracic spine to the L1-2 level. IMPRESSION:: IMPRESSION: 1. Small left-greater-than-right pleural effusions with associated atelectasis. 2. Cholelithiasis, as seen on recent ultrasound. 3. Moderate to high-grade urinary bladder distention. This is likely the cause for mild distention of the bilateral ureters and renal pelves. No obstructing stone is seen. 4. Stool throughout the colon compatible with constipation. No bowel obstruction. Postsurgical changes of gastric bypass. Electronically Signed   By: Yvonne Kendall M.D.   On: 02/10/2022 09:27   DG Chest 2 View  Result Date: 02/04/2022 CLINICAL DATA:  Dyspnea EXAM: CHEST - 2 VIEW COMPARISON:  04/22/2021 FINDINGS: Stable heart size. Low lung volumes. Small bilateral pleural effusions with diffusely increased interstitial markings bilaterally. No pneumothorax. Reverse right shoulder arthroplasty. IMPRESSION: Small bilateral pleural effusions with diffusely increased interstitial markings bilaterally which may represent a component of edema. Electronically Signed   By: Davina Poke D.O.   On: 02/04/2022 08:09   DG Abd 1 View  Result Date: 02/09/2022 CLINICAL DATA:  Diffuse abdominal pain and constipation for 1 week. EXAM: ABDOMEN - 1 VIEW COMPARISON:  None. FINDINGS: No evidence of dilated bowel loops. Moderate to large stool burden noted. Abdominal wall surgical mesh noted. Surgical clips also seen in the epigastric region and left abdomen. IMPRESSION: No acute findings. Moderate to large stool burden noted. Electronically Signed   By: Marlaine Hind M.D.   On: 02/09/2022 11:02   CT Angio Chest Pulmonary Embolism (PE) W or WO Contrast  Result Date: 02/04/2022 CLINICAL DATA:  Difficulty breathing EXAM: CT ANGIOGRAPHY CHEST WITH CONTRAST TECHNIQUE: Multidetector CT imaging of the chest was performed using the standard protocol during bolus administration of intravenous contrast. Multiplanar CT image reconstructions and  MIPs were obtained to evaluate the vascular anatomy. RADIATION DOSE REDUCTION: This exam was performed according to the departmental dose-optimization program which includes automated exposure control, adjustment of the mA and/or kV according to patient size and/or use of iterative reconstruction technique. CONTRAST:  40m OMNIPAQUE IOHEXOL 350 MG/ML SOLN COMPARISON:  None. FINDINGS: Cardiovascular: Thoracic aorta shows atherosclerotic calcifications. No aneurysmal dilatation is noted. No cardiac enlargement is seen. No coronary calcifications are noted. The pulmonary artery shows a normal branching pattern bilaterally. No filling defect to suggest pulmonary embolism is noted. Mediastinum/Nodes: Thoracic inlet is within normal limits. No sizable hilar or mediastinal adenopathy is noted. The esophagus as visualized is within normal limits. Lungs/Pleura: Bilateral pleural effusions are noted left slightly greater than right with mild atelectatic changes in the bases bilaterally. No focal confluent infiltrate is seen. No parenchymal nodules are noted. Upper Abdomen: Postsurgical changes are noted in the stomach consistent with prior gastric bypass. The remainder of the upper abdomen appears within normal limits. Musculoskeletal: Degenerative changes of the thoracic spine are noted. Prior right shoulder replacement is noted. Fractures involving the left fourth and sixth ribs are noted anteriorly. Some healed fractures are noted on the left as well. Review of the MIP images confirms the above findings. IMPRESSION:  No evidence of pulmonary emboli. Fourth and sixth left rib fractures without complicating factors. Old rib fractures are seen on the left. Bilateral effusions with basilar atelectasis. Aortic Atherosclerosis (ICD10-I70.0). Electronically Signed   By: Inez Catalina M.D.   On: 02/04/2022 01:27   NM CARDIAC AMYLOID TUMOR LOC INFLAM SPECT 1 DAY  Result Date: 02/07/2022 CLINICAL DATA:  Cardiomyopathy EXAM:  NUCLEAR MEDICINE TUMOR LOCALIZATION. PYP CARDIAC AMYLOIDOSIS SCAN WITH SPECT TECHNIQUE: Following intravenous administration of radiopharmaceutical, anterior planar images of the chest were obtained. Regions of interest were placed on the heart and contralateral chest wall for quantitative assessment. Additional SPECT imaging of the chest was obtained. RADIOPHARMACEUTICALS:  20.4 mCi TECHNETIUM 99 PYROPHOSPHATE FINDINGS: Planar Visual assessment: Anterior planar imaging demonstrates radiotracer uptake within the heart less than uptake within the adjacent ribs (Grade 1). Quantitative assessment : Quantitative assessment of the cardiac uptake compared to the contralateral chest wall is equal to 1.16 (H/CL = 1.16). SPECT assessment: SPECT imaging of the chest demonstrates faint radiotracer accumulation within the LEFT ventricle. IMPRESSION: Visual and quantitative assessment (grade 1, H/CL equal 1.16) are equivocally suggestive of transthyretin amyloidosis. Electronically Signed   By: Lavonia Dana M.D.   On: 02/07/2022 13:58   CARDIAC CATHETERIZATION  Result Date: 02/06/2022   Hemodynamic findings consistent with moderate pulmonary hypertension. 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 Pulmonary vascular resistance 1.99 Woods units. Mean pulmonary artery pressure 38 mmHg; mean right atrial pressure 16 mmHg; mean pulmonary wedge pressure 23 mmHg. Mixed venous O2 saturation 67%; arterial oxygen saturation 96%. Fick cardiac output 7.5 L/min with index 3.71. Pulmonary artery pulsatility index 1.75. RECOMMENDATIONS: Findings suggest diastolic left ventricular dysfunction as a component of the patient's heart failure.  Other risk factors for pulmonary hypertension include prior PE, morbid obesity, and sleep apnea. Consider adding SGLT2 therapy.  DG Hand 2 View Left  Result Date: 02/14/2022 CLINICAL DATA:  Left fifth digit pain. EXAM: LEFT HAND - 2 VIEW  COMPARISON:  None. FINDINGS: There is a splint overlying the fifth finger. There is an oblique, mildly comminuted fracture of the proximal phalanx of fifth finger with minimal 1 mm cortical step-off at the slightly distal aspect of the lateral shaft of the proximal phalanx. The fracture lines come close to the proximal articular surface. There is a small possible erosion at the distal medial aspect of the fifth metacarpal head, appearing chronic. Moderate joint space narrowing of the DIP joints and thumb interphalangeal joints diffusely. Moderate to severe thumb carpometacarpal and moderate triscaphe joint space narrowing subchondral sclerosis and peripheral osteophytosis degenerative change. IMPRESSION: Acute, comminuted, minimally displaced fracture of the proximal to mid aspect of the proximal phalanx of fifth finger. This comes close to the proximal articular surface without definite extension through the articular surface. Electronically Signed   By: Yvonne Kendall M.D.   On: 02/14/2022 13:37   NM Myocar Multi W/Spect Tamela Oddi Motion / EF  Result Date: 02/05/2022 CLINICAL DATA:  Chest pain.  Shortness of breath.  Dizziness. EXAM: MYOCARDIAL IMAGING WITH SPECT (REST AND PHARMACOLOGIC-STRESS) GATED LEFT VENTRICULAR WALL MOTION STUDY LEFT VENTRICULAR EJECTION FRACTION TECHNIQUE: Standard myocardial SPECT imaging was performed after resting intravenous injection of 10.6 mCi Tc-77mtetrofosmin. Subsequently, intravenous infusion of Lexiscan was performed under the supervision of the Cardiology staff. At peak effect of the drug, 30 mCi Tc-963metrofosmin was injected intravenously and standard myocardial SPECT imaging was performed. Quantitative gated imaging was also performed to evaluate left ventricular wall motion, and  estimate left ventricular ejection fraction. COMPARISON:  Chest radiograph 02/04/2022.  CTA chest 02/04/2022. FINDINGS: Perfusion: No decreased activity in the left ventricle on stress imaging to  suggest reversible ischemia or infarction. Wall Motion: Normal left ventricular wall motion. No left ventricular dilation. Left Ventricular Ejection Fraction: 50 % End diastolic volume 294 ml End systolic volume 52 ml IMPRESSION: 1. No reversible ischemia or infarction. 2. Normal left ventricular wall motion. 3. Left ventricular ejection fraction 50% 4. Non invasive risk stratification*: Low *2012 Appropriate Use Criteria for Coronary Revascularization Focused Update: J Am Coll Cardiol. 7654;65(0):354-656. http://content.airportbarriers.com.aspx?articleid=[email or web address] Electronically Signed   By: Abigail Miyamoto M.D.   On: 02/05/2022 16:24   MR CARDIAC MORPHOLOGY W WO CONTRAST  Result Date: 02/07/2022 CLINICAL DATA:  Cardiac amyloidosis EXAM: CARDIAC MRI TECHNIQUE: The patient was scanned on a 1.5 Tesla GE magnet. A dedicated cardiac coil was used. Functional imaging was done using Fiesta sequences. 2,3, and 4 chamber views were done to assess for RWMA's. Modified Simpson's rule using a short axis stack was used to calculate an ejection fraction on a dedicated work Conservation officer, nature. The patient received 8 cc of Gadavist. After 10 minutes inversion recovery sequences were used to assess for infiltration and scar tissue. FINDINGS: Limited images of the lung fields showed bilateral small to moderate pleural effusions. There was right basilar airspace disease. Normal left ventricular size and wall thickness. Normal wall motion with EF 59%. Normal RV size and systolic function, EF 81%. Moderate biatrial enlargement. Moderate mitral regurgitation with regurgitant fraction 25%. Trileaflet aortic valve with partially fused left and right coronary cusps. Probably mild aortic stenosis. Mild aortic insufficiency, regurgitant fraction 7%. Difficult delayed enhancement images. There appeared to be subepicardial basal inferior and basal inferolateral late gadolinium enhancement (LGE). MEASUREMENTS: MEASUREMENTS LVEDV  165 mL LVSV 96 mL LVEF 59% RVEDV 179 mL RVSV 100 mL RVEF 56% Aortic forward volume 72 mL Aortic regurgitant fraction 7% Mitral regurgitant fraction 25% T1 1120, ECV 49% (septum) T2 measures not elevated. IMPRESSION: 1.  Small to moderate pleural effusions. 2.  Normal LV size and wall thickness, EF 59%. 3.  Normal RV size and systolic function, EF 27%. 4. Noncoronary pattern of LGE in the subepicardial basal inferior and basal inferolateral walls. 5.  Significantly elevated extracellular volume percentage, 49%. The LV walls are not thickened as would be expected with amyloidosis. Though there is LGE seen, it is in more of a myocarditis pattern than an amyloidosis pattern. 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 Dalton Mclean Electronically Signed   By: Loralie Champagne M.D.   On: 02/07/2022 19:04   ECHOCARDIOGRAM COMPLETE  Result Date: 02/04/2022    ECHOCARDIOGRAM REPORT   Patient Name:   NATE PERRI Dacey Date of Exam: 02/04/2022 Medical Rec #:  [id number]    Height:       67.0 in Accession #:    [id number]   Weight:       206.2 lb Date of Birth:  [birth year]     BSA:          2.049 m Patient Age:    14 years     BP:           84/50 mmHg Patient Gender: F            HR:           93 bpm. Exam Location:  Inpatient Procedure: 2D Echo, Cardiac Doppler and Color Doppler Indications:  Dyspnea  History:        Patient has prior history of Echocardiogram examinations.                 Pulmonary HTN, Arrythmias:Atrial Fibrillation; Risk                 Factors:Hypertension and Sleep Apnea.  Sonographer:    Jyl Heinz Referring Phys: [id number] MICHAEL ANDREW Derwood  1. Left ventricular ejection fraction, by estimation, is 55 to 60%. The left ventricle has normal function. The left ventricle has no regional wall motion abnormalities. Left ventricular diastolic parameters are consistent with Grade II diastolic dysfunction (pseudonormalization). Elevated left  atrial pressure.  2. Right ventricular systolic function is normal. The right ventricular size is normal. There is mildly elevated pulmonary artery systolic pressure.  3. Left atrial size was mildly dilated.  4. Right atrial size was mildly dilated.  5. The mitral valve is normal in structure. Mild to moderate mitral valve regurgitation. No evidence of mitral stenosis.  6. The aortic valve is tricuspid. There is mild calcification of the aortic valve. There is mild thickening of the aortic valve. Aortic valve regurgitation is moderate. Mild aortic valve stenosis. Aortic valve mean gradient measures 15.3 mmHg. Aortic valve Vmax measures 2.51 m/s.  7. The inferior vena cava is dilated in size with >50% respiratory variability, suggesting right atrial pressure of 8 mmHg. Comparison(s): Prior images unable to be directly viewed, comparison made by report only. FINDINGS  Left Ventricle: Left ventricular ejection fraction, by estimation, is 55 to 60%. The left ventricle has normal function. The left ventricle has no regional wall motion abnormalities. The left ventricular internal cavity size was normal in size. There is  no left ventricular hypertrophy. Left ventricular diastolic parameters are consistent with Grade II diastolic dysfunction (pseudonormalization). Elevated left atrial pressure. Right Ventricle: The right ventricular size is normal. No increase in right ventricular wall thickness. Right ventricular systolic function is normal. There is mildly elevated pulmonary artery systolic pressure. The tricuspid regurgitant velocity is 2.91  m/s, and with an assumed right atrial pressure of 8 mmHg, the estimated right ventricular systolic pressure is 62.8 mmHg. Left Atrium: Left atrial size was mildly dilated. Right Atrium: Right atrial size was mildly dilated. Pericardium: There is no evidence of pericardial effusion. Mitral Valve: The mitral valve is normal in structure. Mild mitral annular calcification. Mild to  moderate mitral valve regurgitation, with centrally-directed jet. No evidence of mitral valve stenosis. Tricuspid Valve: The tricuspid valve is normal in structure. Tricuspid valve regurgitation is mild. Aortic Valve: The aortic valve is tricuspid. There is mild calcification of the aortic valve. There is mild thickening of the aortic valve. Aortic valve regurgitation is moderate. Aortic regurgitation PHT measures 257 msec. Mild aortic stenosis is present. Aortic valve mean gradient measures 15.3 mmHg. Aortic valve peak gradient measures 25.2 mmHg. Aortic valve area, by VTI measures 1.44 cm. Pulmonic Valve: The pulmonic valve was grossly normal. Pulmonic valve regurgitation is not visualized. Aorta: The aortic root and ascending aorta are structurally normal, with no evidence of dilitation. Venous: The inferior vena cava is dilated in size with greater than 50% respiratory variability, suggesting right atrial pressure of 8 mmHg. IAS/Shunts: No atrial level shunt detected by color flow Doppler.  LEFT VENTRICLE PLAX 2D LVIDd:         4.23 cm      Diastology LVIDs:         2.82 cm      LV e'  medial:    7.07 cm/s LV PW:         1.08 cm      LV E/e' medial:  17.0 LV IVS:        1.00 cm      LV e' lateral:   8.81 cm/s LVOT diam:     2.20 cm      LV E/e' lateral: 13.6 LV SV:         69 LV SV Index:   34 LVOT Area:     3.80 cm  LV Volumes (MOD) LV vol d, MOD A2C: 122.0 ml LV vol d, MOD A4C: 135.0 ml LV vol s, MOD A2C: 50.5 ml LV vol s, MOD A4C: 56.6 ml LV SV MOD A2C:     71.5 ml LV SV MOD A4C:     135.0 ml LV SV MOD BP:      76.2 ml RIGHT VENTRICLE RV Basal diam:  3.87 cm RV Mid diam:    3.10 cm RV S prime:     9.46 cm/s TAPSE (M-mode): 1.7 cm LEFT ATRIUM             Index        RIGHT ATRIUM           Index LA diam:        3.70 cm 1.81 cm/m   RA Area:     20.60 cm 10.06 cm/m LA Vol (A2C):   69.7 ml 34.02 ml/m LA Vol (A4C):   74.0 ml 36.12 ml/m LA Biplane Vol: 77.4 ml 37.78 ml/m  AORTIC VALVE AV Area (Vmax):     1.45 cm AV Area (Vmean):   1.42 cm AV Area (VTI):     1.44 cm AV Vmax:           251.00 cm/s AV Vmean:          190.333 cm/s AV VTI:            0.480 m AV Peak Grad:      25.2 mmHg AV Mean Grad:      15.3 mmHg LVOT Vmax:         96.00 cm/s LVOT Vmean:        70.900 cm/s LVOT VTI:          0.182 m LVOT/AV VTI ratio: 0.38 AI PHT:            257 msec  AORTA Ao Root diam: 3.10 cm Ao Asc diam:  2.90 cm MITRAL VALVE                TRICUSPID VALVE MV Area (PHT): 4.15 cm     TR Peak grad:   33.9 mmHg MV Decel Time: 183 msec     TR Vmax:        291.00 cm/s MR Peak grad: 71.2 mmHg MR Mean grad: 51.0 mmHg     SHUNTS MR Vmax:      422.00 cm/s   Systemic VTI:  0.18 m MR Vmean:     348.0 cm/s    Systemic Diam: 2.20 cm MV E velocity: 120.00 cm/s MV A velocity: 83.90 cm/s MV E/A ratio:  1.43 Mihai Croitoru MD Electronically signed by Sanda Klein MD Signature Date/Time: 02/04/2022/11:04:47 AM    Final    US Abdomen Limited RUQ (LIVER/GB)  Result Date: 02/09/2022 CLINICAL DATA:  Abdominal pain EXAM: ULTRASOUND ABDOMEN LIMITED RIGHT UPPER QUADRANT COMPARISON:  None. FINDINGS: Gallbladder: Gallbladder stones are seen. There is no significant wall thickening.  Technologist observed tenderness over the gallbladder during the study. There is no fluid around the gallbladder. Common bile duct: Diameter: 6.4 mm Liver: There is inhomogeneous echogenicity in the visualized portions of liver. No focal abnormality is seen in the visualized portions of liver. Portal vein is patent on color Doppler imaging with normal direction of blood flow towards the liver. Other: Incidental note is made of 2.2 cm cyst in the upper pole of right kidney. IMPRESSION: Gallbladder stones. Technologist observed tenderness over the region of the gallbladder. There are no other imaging signs of acute cholecystitis. Please correlate with clinical symptoms and laboratory findings. There is heterogeneous echogenicity in the liver without focal abnormalities. 2.2  cm cyst is seen in the right kidney. Electronically Signed   By: Elmer Picker M.D.   On: 02/09/2022 12:58    Microbiology: Recent Results (from the past 240 hour(s))  SARS Coronavirus 2 by RT PCR (hospital order, performed in [facility] hospital lab) Nasopharyngeal Nasopharyngeal Swab     Status: None   Collection Time: 02/06/22  3:42 AM   Specimen: Nasopharyngeal Swab  Result Value Ref Range Status   SARS Coronavirus 2 NEGATIVE NEGATIVE Final    Comment: (NOTE) SARS-CoV-2 target nucleic acids are NOT DETECTED.  The SARS-CoV-2 RNA is generally detectable in upper and lower respiratory specimens during the acute phase of infection. The lowest concentration of SARS-CoV-2 viral copies this assay can detect is 250 copies / mL. A negative result does not preclude SARS-CoV-2 infection and should not be used as the sole basis for treatment or other patient management decisions.  A negative result may occur with improper specimen collection / handling, submission of specimen other than nasopharyngeal swab, presence of viral mutation(s) within the areas targeted by this assay, and inadequate number of viral copies (<250 copies / mL). A negative result must be combined with clinical observations, patient history, and epidemiological information.  Fact Sheet for Patients:   StrictlyIdeas.no  Fact Sheet for Healthcare Providers: BankingDealers.co.za  This test is not yet approved or  cleared by the [place] FDA and has been authorized for detection and/or diagnosis of SARS-CoV-2 by FDA under an Emergency Use Authorization (EUA).  This EUA will remain in effect (meaning this test can be used) for the duration of the COVID-19 declaration under Section 564(b)(1) of the Act, 21 U.S.C. section 360bbb-3(b)(1), unless the authorization is terminated or revoked sooner.  Performed at [facility], [place] [address] [place],  [place] [address]   Urine Culture     Status: Abnormal   Collection Time: 02/10/22  2:07 PM   Specimen: In/Out Cath Urine  Result Value Ref Range Status   Specimen Description IN/OUT CATH URINE  Final   Special Requests   Final    NONE Performed at [facility], [place] [address] [place], [place] [address]    Culture >=100,000 COLONIES/mL YEAST (A)  Final   Report Status 02/12/2022 FINAL  Final  Urine Culture     Status: Abnormal   Collection Time: 02/13/22  4:15 PM   Specimen: Urine, Clean Catch  Result Value Ref Range Status   Specimen Description URINE, CLEAN CATCH  Final   Special Requests   Final    NONE Performed at [facility], [place] [address] [place], [place] [address]    Culture >=100,000 COLONIES/mL YEAST (A)  Final   Report Status 02/14/2022 FINAL  Final     Labs: Basic Metabolic Panel: Recent Labs  Lab 02/11/22 0301 02/12/22 0351 02/13/22 0218 02/14/22 0340 02/15/22 0231  NA 143 140 140 139 136  K 4.1 4.1 4.1 3.8 4.0  CL 102 97* 97* 96* 96*  CO2 '26 31 28 30 26  '$ GLUCOSE 80 100* 99 85 88  BUN 44* 48* 60* 65* 63*  CREATININE 1.92* 2.18* 2.86* 2.53* 2.34*  CALCIUM 9.4 9.4 9.2 9.0 9.1  MG 2.4  --  2.4  --   --    Liver Function Tests: Recent Labs  Lab 02/09/22 1030 02/10/22 0811 02/13/22 0218  AST 13* 15 21  ALT '16 15 15  '$ ALKPHOS 99 106 103  BILITOT 0.7 0.4 0.7  PROT 6.7 6.5 6.8  ALBUMIN 2.7* 2.6* 2.9*   Recent Labs  Lab 02/09/22 1030  LIPASE 26  AMYLASE 18*   Recent Labs  Lab 02/10/22 0811  AMMONIA 21   CBC: Recent Labs  Lab 02/09/22 1030 02/10/22 0811 02/13/22 0218 02/14/22 0340 02/15/22 0231  WBC 6.4 8.5 6.7 6.4 5.3  NEUTROABS 4.2  --   --   --   --   HGB 10.4* 10.6* 12.1 11.0* 10.9*  HCT 31.7* 33.3* 38.8 34.3* 34.4*  MCV 101.3* 99.1 101.6* 99.7 100.0  PLT 303 337 311 275 253   Cardiac Enzymes: No results for input(s): CKTOTAL, CKMB, CKMBINDEX, TROPONINI in the last 168 hours. BNP: BNP (last 3 results) Recent Labs     02/03/22 1544  BNP 841.9*    ProBNP (last 3 results) No results for input(s): PROBNP in the last 8760 hours.  CBG: No results for input(s): GLUCAP in the last 168 hours.     Signed:  Domenic Polite MD.  [facility] 02/15/2022, 10:28 AM

## 2022-02-15 NOTE — Progress Notes (Signed)
Mobility Specialist Progress Note: ? ? 02/15/22 0945  ?Mobility  ?Activity Ambulated with assistance in room;Transferred from bed to chair  ?Level of Assistance Moderate assist, patient does 50-74%  ?Assistive Device  ?Sport and exercise psychologist)  ?RUE Weight Bearing PWB  ?Distance Ambulated (ft) 12 ft  ?Activity Response Tolerated well  ?$Mobility charge 1 Mobility  ? ?Pt eager for mobility session this am. ModI for bed mobility, required modA to stand from EOB, and minA during ambulation. Pt c/o dizziness upon standing, VSS, pt recovered with seated rest break. No dizziness upon attempt #2. Pt pleased with progress this am, left sitting up in chair with all needs met.  ? ?Nelta Numbers ?Acute Rehab ?Phone: [phone] ?Office Phone: [phone] ? ?

## 2022-02-16 DIAGNOSIS — K5901 Slow transit constipation: Secondary | ICD-10-CM

## 2022-02-16 DIAGNOSIS — R339 Retention of urine, unspecified: Secondary | ICD-10-CM

## 2022-02-16 DIAGNOSIS — S81801S Unspecified open wound, right lower leg, sequela: Secondary | ICD-10-CM

## 2022-02-16 DIAGNOSIS — S81802S Unspecified open wound, left lower leg, sequela: Secondary | ICD-10-CM

## 2022-02-16 MED ORDER — BETHANECHOL CHLORIDE 10 MG PO TABS
10.0000 mg | ORAL_TABLET | Freq: Three times a day (TID) | ORAL | Status: DC
  Administered 2022-02-16 – 2022-02-17 (×4): 10 mg via ORAL
  Filled 2022-02-16 (×5): qty 1

## 2022-02-16 NOTE — Progress Notes (Signed)
Bladder scan performed at 1030 am revealing 101 cc of urine. Patient requested to try and void independently. Transferred to [facility], patient voided 50 cc. Bladder scan performed at 1500, revealing 187. Patient still without void. I/O cath performed using sterile technique and 16 french catheter. 400 cc of amber urine removed. Patient tolerated procedure well. Will continue to monitor.  ?
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
## 2022-02-16 NOTE — Consult Note (Signed)
[place] Nurse Consult Note: ?Reason for Consult:Thermal injuries to bilateral medial and lateral LEs sustained via use of a UV treatment for neuropathy. ?Wound type:Thermal, full thickness ?Pressure Injury POA: N/A ?Measurement: ?LLE Medial: 4cm x 2.5cm x 0.2cm ?LLE Lateral: 5cm x 2.6cm x 0.2cm ?RLE Medial: 4.4cm x 3cm x 0.2cm ?RLE Lateral: 5cm x 3cm x 0.2cm ?Wound YYT:KPTW, moist, granulating ?Drainage (amount, consistency, odor) scant to small serous ?Periwound: intact with evidence of wound healing, specifically contraction at periphery ?Dressing procedure/placement/frequency: ?I will continue the dressing POC implemented by Dr. Naaman Plummer using xeroform antimicrobial nonadherent gauze for atraumatic dressing changes, but will top with dry gauze and silicone foam dressings to decrease the footprint of the dressings. I have also endorsed showering as a wound cleansing option with redressing after.  TED hosiery may be applied over the silicone foam dressings to assist in the management of orthostatic blood pressures. ? ?[place] nursing team will not follow, but will remain available to this patient, the nursing and medical teams.  Please re-consult if needed. ?Thanks, ?Maudie Flakes, MSN, RN, [place], [place], CWON-AP, [place]  ?Pager# [phone]  ? ? ? ?  ?

## 2022-02-16 NOTE — Progress Notes (Addendum)
?                                                       PROGRESS NOTE ? ? ?Subjective/Complaints: ?Pt unable to void and required I/O caths x 2 overnight. Patient had reasonable night otherwise. Pain seems controlled. Ready to get moving with therapy today ? ?ROS: Patient denies fever, rash, sore throat, blurred vision, dizziness, nausea, vomiting, diarrhea, cough, shortness of breath or chest pain, neck pain, headache, or mood change.  ? ? ?Objective: ?  ?DG Hand 2 View Left ? ?Result Date: 02/14/2022 ?CLINICAL DATA:  Left fifth digit pain. EXAM: LEFT HAND - 2 VIEW COMPARISON:  None. FINDINGS: There is a splint overlying the fifth finger. There is an oblique, mildly comminuted fracture of the proximal phalanx of fifth finger with minimal 1 mm cortical step-off at the slightly distal aspect of the lateral shaft of the proximal phalanx. The fracture lines come close to the proximal articular surface. There is a small possible erosion at the distal medial aspect of the fifth metacarpal head, appearing chronic. Moderate joint space narrowing of the DIP joints and thumb interphalangeal joints diffusely. Moderate to severe thumb carpometacarpal and moderate triscaphe joint space narrowing subchondral sclerosis and peripheral osteophytosis degenerative change. IMPRESSION: Acute, comminuted, minimally displaced fracture of the proximal to mid aspect of the proximal phalanx of fifth finger. This comes close to the proximal articular surface without definite extension through the articular surface. Electronically Signed   By: Yvonne Kendall M.D.   On: 02/14/2022 13:37   ?Recent Labs  ?  02/14/22 ?1443 02/15/22 ?0231  ?WBC 6.4 5.3  ?HGB 11.0* 10.9*  ?HCT 34.3* 34.4*  ?PLT 275 253  ? ?Recent Labs  ?  02/14/22 ?1540 02/15/22 ?0231  ?NA 139 136  ?K 3.8 4.0  ?CL 96* 96*  ?CO2 30 26  ?GLUCOSE 85 88  ?BUN 65* 63*  ?CREATININE 2.53* 2.34*  ?CALCIUM 9.0 9.1  ? ? ?Intake/Output Summary (Last 24 hours) at 02/16/2022 0817 ?Last data filed  at 02/16/2022 0400 ?Gross per 24 hour  ?Intake 477 ml  ?Output 1000 ml  ?Net -523 ml  ?  ? ?  ? ?Physical Exam: ?Vital Signs ?Blood pressure 115/63, pulse 84, temperature 98.7 ?F (37.1 ?C), temperature source Oral, resp. rate 14, weight 83.5 kg, SpO2 97 %. ? ?General: Alert and oriented x 3, No apparent distress, obese ?HEENT: Head is normocephalic, atraumatic, PERRLA, EOMI, sclera anicteric, oral mucosa pink and moist, dentition intact, ext ear canals clear,  ?Neck: Supple without JVD or lymphadenopathy ?Heart: Reg rate and rhythm. No murmurs rubs or gallops ?Chest: CTA bilaterally without wheezes, rales, or rhonchi; no distress ?Abdomen: Soft, non-tender, non-distended, bowel sounds positive. ?Extremities: No clubbing, cyanosis, tr LE edema. Pulses are 2+ ?Psych: Pt's affect is appropriate. Pt is cooperative ?Skin: bilateral LE's/shins each with two 2" diameter wounds which are fairly superficial and with 95% granulation, clean appearing.  ?Neuro:  Alert and oriented x 3. Normal insight and awareness. Intact Memory. Normal language and speech. Cranial nerve exam unremarkable. RUE limited by [facility], LUE 3-4/5. LE 3 prox to 4- distally. Decreased LT in stocking glove pattern ?Musculoskeletal: right arm in [facility], Left 4th/5th digits splinted  ? ? ?Assessment/Plan: ?1. Functional deficits which require 3+ hours per day of interdisciplinary therapy in a comprehensive  inpatient rehab setting. ?Physiatrist is providing close team supervision and 24 hour management of active medical problems listed below. ?Physiatrist and rehab team continue to assess barriers to discharge/monitor patient progress toward functional and medical goals ? ?Care Tool: ? ?Bathing ?   ?   ?   ?  ?  ?Bathing assist   ?  ?  ?Upper Body Dressing/Undressing ?Upper body dressing   ?  ?   ?Upper body assist   ?   ?Lower Body Dressing/Undressing ?Lower body dressing ? ? ?   ?  ? ?  ? ?Lower body assist   ?   ? ?Toileting ?Toileting    ?Toileting assist Assist  for toileting: Dependent - Patient 0% ?  ?  ?Transfers ?Chair/bed transfer ? ?Transfers assist ?   ? ?  ?  ?  ?Locomotion ?Ambulation ? ? ?Ambulation assist ? ?   ? ?  ?  ?   ? ?Walk 10 feet activity ? ? ?Assist ?   ? ?  ?   ? ?Walk 50 feet activity ? ? ?Assist   ? ?  ?   ? ? ?Walk 150 feet activity ? ? ?Assist   ? ?  ?  ?  ? ?Walk 10 feet on uneven surface  ?activity ? ? ?Assist   ? ? ?  ?   ? ?Wheelchair ? ? ? ? ?Assist   ?  ?  ? ?  ?   ? ? ?Wheelchair 50 feet with 2 turns activity ? ? ? ?Assist ? ?  ?  ? ? ?   ? ?Wheelchair 150 feet activity  ? ? ? ?Assist ?   ? ? ?   ? ?Blood pressure 115/63, pulse 84, temperature 98.7 ?F (37.1 ?C), temperature source Oral, resp. rate 14, weight 83.5 kg, SpO2 97 %. ? ?Medical Problem List and Plan: ?1. Functional deficits secondary to debility after congestive heart failure and multiple other med/surg issues ?            -patient may shower if LUE/cast is covered ?            -ELOS/Goals: 14-16 days, supervision goals ? -Patient is beginning CIR therapies today including PT and OT  ?2.  Antithrombotics: ?-DVT/anticoagulation:  sq heparin 5000u q8 ?            -antiplatelet therapy: ASA '81mg'$  daily ?3. Pain Management: hydrocodone 7.'5mg'$  qid ?            -prn tylenol ?            -increased scheduled gabapentin to '200mg'$  tid for neuropathic pain ?            -cymbalta ?4. Mood: pt relatively up beat. Team to provide ego support ?            -cymbalta ?            -antipsychotic agents: n/a ?5. Neuropsych: This patient is capable of making decisions on her own behalf. ?6. Skin/Wound Care:   ? -xeroform, kerlix for chronic burn wounds. Wound clean/granulating ?            -pt followed at wound clinic remotely ?            -recent cultures prior to admit + for S Aureus and Pseudomonas ?            -will consult WOC RN for further recs for wound care ?7. Fluids/Electrolytes/Nutrition: encourage appropriate po ?            -  added protein supps ?            -RD f/u ?            -reviewed  importance of nutrition in her recovery ?8. Acute on chronic CHF: EF 55-60%. Monitor weights daily wit strict I/O ?            -pt diuresed 7L on acute ?            -continue O2 ?            -demadex '20mg'$  daily ?            -zebeta 2.'5mg'$  daily ?9. Acute on chronic renal failure: Baseline SCr 1.4-->continue to moitor ?            --likely due to cardiorenal syndrome, recent contrast and Bactrim. ?10. Hx f HTN, now with Orthostatic Hypotension and sinus tach:  ?-Continue midodrine  ?            -zebeta for rate control ?            -appropriate volume mtc ?            -acclimation with therapies ?            -daily orthostatic vs ?            -abdominal binder ? -?vestibular component, meclizine prn ?11. RLS: requip ?12. Urine retention: still retaining ? -increase urecholine to '10mg'$  tid ?            -oob to void ? -treat fungal UTI per below ?13. Slow transit constipation. No bm since 2/28 ?            -miralax bid, senokot s at hs ? -sorbitol today ?14. GI prophylaxis ?            -protonix bid ?            -carafate tid ?15. Fungal UTI: diflucan '100mg'$  daily thru 3/9 ?16. Recent right wrist fx, left 5th phalanx fx ?            -RUE SAC replaced 3/3--f/u with Dr. Grandville Silos as outpt ?            -left finger splint in place ?17. Class 1 Obesity ?18. Pulmonary HTN: see #8 ?  ?  ? ?LOS: ?1 days ?A FACE TO FACE EVALUATION WAS PERFORMED ? ?Meredith Staggers ?02/16/2022, 8:17 AM  ? ?  ?

## 2022-02-16 NOTE — Progress Notes (Signed)
Patient cathed twice this shift due to inability to void. Cathed for 677m and 3541m ?

## 2022-02-16 NOTE — Plan of Care (Signed)
?  Problem: Sit to Stand ?Goal: LTG:  Patient will perform sit to stand in prep for activites of daily living with assistance level (OT) ?Description: LTG:  Patient will perform sit to stand in prep for activites of daily living with assistance level (OT) ?Flowsheets (Taken 02/16/2022 1249) ?LTG: PT will perform sit to stand in prep for activites of daily living with assistance level: Supervision/Verbal cueing ?  ?Problem: RH Bathing ?Goal: LTG Patient will bathe all body parts with assist levels (OT) ?Description: LTG: Patient will bathe all body parts with assist levels (OT) ?Flowsheets (Taken 02/16/2022 1249) ?LTG: Pt will perform bathing with assistance level/cueing: Supervision/Verbal cueing ?  ?Problem: RH Dressing ?Goal: LTG Patient will perform upper body dressing (OT) ?Description: LTG Patient will perform upper body dressing with assist, with/without cues (OT). ?Flowsheets (Taken 02/16/2022 1249) ?LTG: Pt will perform upper body dressing with assistance level of: Supervision/Verbal cueing ?Goal: LTG Patient will perform lower body dressing w/assist (OT) ?Description: LTG: Patient will perform lower body dressing with assist, with/without cues in positioning using equipment (OT) ?Flowsheets (Taken 02/16/2022 1249) ?LTG: Pt will perform lower body dressing with assistance level of: Supervision/Verbal cueing ?  ?Problem: RH Toileting ?Goal: LTG Patient will perform toileting task (3/3 steps) with assistance level (OT) ?Description: LTG: Patient will perform toileting task (3/3 steps) with assistance level (OT)  ?Flowsheets (Taken 02/16/2022 1249) ?LTG: Pt will perform toileting task (3/3 steps) with assistance level: Minimal Assistance - Patient > 75% ?  ?Problem: RH Toilet Transfers ?Goal: LTG Patient will perform toilet transfers w/assist (OT) ?Description: LTG: Patient will perform toilet transfers with assist, with/without cues using equipment (OT) ?Flowsheets (Taken 02/16/2022 1249) ?LTG: Pt will perform toilet  transfers with assistance level of: Supervision/Verbal cueing ?  ?Problem: RH Tub/Shower Transfers ?Goal: LTG Patient will perform tub/shower transfers w/assist (OT) ?Description: LTG: Patient will perform tub/shower transfers with assist, with/without cues using equipment (OT) ?Flowsheets (Taken 02/16/2022 1249) ?LTG: Pt will perform tub/shower stall transfers with assistance level of: Contact Guard/Touching assist ?LTG: Pt will perform tub/shower transfers from: Walk in shower ?  ?Problem: RH Memory ?Goal: LTG Patient will demonstrate ability for day to day recall/carry over during activities of daily living with assistance level (OT) ?Description: LTG:  Patient will demonstrate ability for day to day recall/carry over during activities of daily living with assistance level (OT). ?Flowsheets (Taken 02/16/2022 1249) ?LTG:  Patient will demonstrate ability for day to day recall/carry over during activities of daily living with assistance level (OT): Supervision ?  ?Problem: RH Attention ?Goal: LTG Patient will demonstrate this level of attention during functional activites (OT) ?Description: LTG:  Patient will demonstrate this level of attention during functional activites  (OT) ?Flowsheets (Taken 02/16/2022 1249) ?Patient will demonstrate this level of attention during functional activites: Selective ?Patient will demonstrate above attention level in the following environment: Controlled ?LTG: Patient will demonstrate this level of attention during functional activites (OT): Supervision ?  ?
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
## 2022-02-17 ENCOUNTER — Encounter (INDEPENDENT_AMBULATORY_CARE_PROVIDER_SITE_OTHER): Payer: Medicare Other | Admitting: Vascular Surgery

## 2022-02-17 ENCOUNTER — Encounter (INDEPENDENT_AMBULATORY_CARE_PROVIDER_SITE_OTHER): Payer: Medicare Other

## 2022-02-17 MED ORDER — SODIUM CHLORIDE 0.9 % IV BOLUS
250.0000 mL | Freq: Once | INTRAVENOUS | Status: AC
  Administered 2022-02-17: 250 mL via INTRAVENOUS

## 2022-02-17 MED ORDER — SODIUM CHLORIDE 0.9 % IV BOLUS: 250.0000 mL | Freq: Once | INTRAVENOUS | Status: DC

## 2022-02-17 MED ORDER — TORSEMIDE 20 MG PO TABS
20.0000 mg | ORAL_TABLET | Freq: Every day | ORAL | Status: DC
Start: 2022-02-19 — End: 2022-02-24
  Administered 2022-02-19 – 2022-02-24 (×6): 20 mg via ORAL
  Filled 2022-02-17 (×7): qty 1

## 2022-02-17 NOTE — Progress Notes (Signed)
Patient restless majority of the night- tossed and turned. Patient removed cpap multiple times. Large continent bowel movement this morning. Patient unable to void this shift. Cathed x2- last cath at 0400 for 400cc of clear yelllow urine.  ?

## 2022-02-17 NOTE — Progress Notes (Signed)
Inpatient Rehabilitation Care Coordinator ?Assessment and Plan ?Patient Details  ?Name: [name] ?MRN: [id number] ?Date of Birth: 1946-10-12 ? ?Today's Date: 02/17/2022 ? ?Hospital Problems: Principal Problem: ?  Debility ? ?Past Medical History:  ?Past Medical History:  ?Diagnosis Date  ? Anemia   ? unable to absorb iron after gastric bypass  ? Arthritis   ? generalized  ? Asthma   ? Atrophic vaginitis   ? Back pain   ? DDD/stenosis  ? Carotid stenosis   ? Carotid US 10/16: Plaque RICA (6-38%), normal LICA  ? Depression   ? takes Cymbalta daily  ? Diverticulosis   ? benign  ? DJD (degenerative joint disease)   ? Dyslipidemia   ? Dysrhythmia   ? Family history of GI bleeding   ? GERD (gastroesophageal reflux disease)   ? takes Nexium daily  ? Gestational diabetes   ? Pt denies  ? H/O hiatal hernia   ? surgery for hernia  ? Headache(784.0)   ? takes Imitrex daily as needed and Bisoprolol daily;last migraine was about 2wks ago  ? History of bronchitis 1 yr ago  ? History of shingles   ? Insomnia   ? takes Ambien nightly  ? Joint pain   ? Joint swelling   ? Leg cramps   ? takes Flexeril daily as needed  ? 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 Nocturia   ? Osteoporosis   ? Peripheral neuropathy   ? takes Gabapentin daily  ? Pneumonia 82yr ago  ? hx of  ? PONV (postoperative nausea and vomiting)   ? Restless leg syndrome   ? takes Requip daily  ? RLS (restless legs syndrome) 08/16/2015  ? Sleep apnea   ? uses CPAP  ? Stroke ([facility]   ? Tubular adenoma of colon   ? Vertigo   ? Vitamin D deficiency   ? ?Past Surgical History:  ?Past Surgical History:  ?Procedure Laterality Date  ? COLONOSCOPY    ? EP IMPLANTABLE DEVICE N/A 11/19/2016  ? Procedure: Loop Recorder Insertion;  Surgeon: SDeboraha Sprang MD;  [facility];  Service: Cardiovascular;  Laterality: N/A;  ? ESOPHAGOGASTRODUODENOSCOPY    ? excess skin removal    ? post weight loss  ? GASTRIC BYPASS  2001  ? HERNIA REPAIR    ? umbilical  ? JOINT REPLACEMENT  Right   ? knee  ? REVERSE SHOULDER ARTHROPLASTY Right 04/25/2021  ? Procedure: REVERSE SHOULDER ARTHROPLASTY carpal tunel realase right wrist;  Surgeon: CTania Ade MD;  Location: [facility];  Service: Orthopedics;  Laterality: Right;  ? RIGHT HEART CATH N/A 02/06/2022  ? Procedure: RIGHT HEART CATH;  Surgeon: SBelva Crome MD;  [facility];  Service: Cardiovascular;  Laterality: N/A;  ? skin reduction  2003 2004  ? stomach and arm  ? STEROID INJECTION TO SCAR    ? x 2 in back   ? TOTAL KNEE ARTHROPLASTY Left 04/03/2015  ? Procedure: LEFT TOTAL KNEE ARTHROPLASTY;  Surgeon: PMelrose Nakayama MD;  [facility]  Service: Orthopedics;  Laterality: Left;  ? WISDOM TOOTH EXTRACTION    ? ?Social History:  reports that she has never smoked. She has never used smokeless tobacco. She reports current alcohol use. She reports that she does not use drugs. ? ?Family / Support Systems ?Marital Status: Married ?Patient Roles: Spouse, Parent ?Spouse/Significant Other: JBroadus [phone]?Children: Three children [place] and [place] supportive ?Other Supports: Friends and  church members ?Anticipated Caregiver: Gary-husband ?Ability/Limitations of Caregiver: husband wants her to be able to get herself back and forth to the bathroom by DC ?Caregiver Availability: 24/7 (at timme golfing) ?Family Dynamics: Close knit with husband and family pt wants to get back to her mod/i level and not have to rely upon husband. He is a busy man and golfs and has hobbies ? ?Social History ?Preferred language: English ?Religion: Seventh Day Adventist ?Cultural Background: No issues ?Education: Dental Hygentist ?Health Literacy - How often do you need to have someone help you when you read instructions, pamphlets, or other written material from your doctor or pharmacy?: Never ?Writes: Yes ?Employment Status: Retired ?Guardian/Conservator: None-according to MD pt is capable of making her own decisions while here.  Husband will be here daily and involved in her care and decisions  ? ?Abuse/Neglect ?Abuse/Neglect Assessment Can Be Completed: Yes ?Physical Abuse: Denies ?Verbal Abuse: Denies ?Sexual Abuse: Denies ?Exploitation of patient/patient's resources: Denies ?Self-Neglect: Denies ? ?Patient response to: ?Social Isolation - How often do you feel lonely or isolated from those around you?: Never ? ?Emotional Status ?Pt's affect, behavior and adjustment status: Pt is motivated to do well and work on her balance and health issues. She is many medical issues going on and feels this is affecting her movement and mobility. She hopes to recover and become independent again ?Recent Psychosocial Issues: other health issues ?Psychiatric History: No history/issues ?Substance Abuse History: none ? ?Patient / Family Perceptions, Expectations & Goals ?Pt/Family understanding of illness & functional limitations: Pt can explain her many health issues and feels she needs to have a tune up and get back on her feet and moving. She does talk with the MD and feels she understands her treatment plan. Wants to talk with MD regarding her tremoring in her one hand ?Premorbid pt/family roles/activities: Wife Mom, grandmother, retiree, church member, etc ?Anticipated changes in roles/activities/participation: resume ?Pt/family expectations/goals: Pt states: " I hope to be able to move around on my own when I leave here my husband wants this too." ? ?Community Resources ?Community Agencies: None ?Premorbid Home Care/DME Agencies: Other (Comment) (rw and tubseat, cane) ?Transportation available at discharge: pt drove up until 2 weeks ago-husband does drive ?Is the patient able to respond to transportation needs?: Yes ?In the past 12 months, has lack of transportation kept you from medical appointments or from getting medications?: No ?In the past 12 months, has lack of transportation kept you from meetings, work, or from getting things needed for daily  living?: No ? ?Discharge Planning ?Living Arrangements: Spouse/significant other ?Support Systems: Spouse/significant other, Children, Friends/neighbors, Church/faith community ?Type of Residence: Private residence ?Insurance Resources: [employer], Multimedia programmer (specify) Scientist, clinical (histocompatibility and immunogenetics) Supplement) ?Financial Resources: Fish farm manager, Family Support ?Financial Screen Referred: No ?Living Expenses: Own ?Money Management: Patient, Spouse ?Does the patient have any problems obtaining your medications?: No ?Home Management: Both usually pt though ?Patient/Family Preliminary Plans: Return home with husband who can provide assist but would like her to be safe to ambulate back and forth to the bathroom. Husband does golf and is gone some. ?Care Coordinator Anticipated Follow Up Needs: HH/OP ? ?Clinical Impression ?Pleasant female who is motivated to do well and regain her mobility and be safe while moving. She has had multiple falls prior to admission and has fractured her wrist after one of them. She has multiple health issues MD is addressing. Will work on safe discharge plan for pt. ? ?Elease Hashimoto ?02/17/2022, 10:09 AM ? ?  ?

## 2022-02-17 NOTE — Progress Notes (Signed)
Occupational Therapy Session Note ? ?Patient Details  ?Name: [name] ?MRN: [id number] ?Date of Birth: 1946/01/12 ? ?Today's Date: 02/17/2022 ?OT Individual Time: 9381-0175 ?OT Individual Time Calculation (min): 42 min  ? ? ?Short Term Goals: ?Week 1:  OT Short Term Goal 1 (Week 1): Pt will don pants with LRAD with no more than mod A ?OT Short Term Goal 2 (Week 1): Pt will utilize adaptive strategies to don shirt with (S) ?OT Short Term Goal 3 (Week 1): Pt will complete toilet transfer with CGA ? ? ?Skilled Therapeutic Interventions/Progress Updates:  ?  Pt greeted at time of session semirreclined bed level with pt stating she "wanted to wait and work more tomorrow" but agreeable to OT session with encouragement. Pt noted to still be in hospital gown, declined getting dressed this late in the day. Supine > sit with CGA and sitting EOB with mild dizziness, checked BP TEDS only at 89/59 and improved to 102/60 with seated rest break. PA entered at this time and recommended binder which was donned and BP improved to 112/56 seated. In standing, BP remained stable with both TEDS and binder, recommended to use this combo in future for standing and mobility. Extended time with PA explaining cardiopulmonary status and importance of OOB activity. Pt symptoms improved and noted to have no brief, donned brief with total A with sit > stand to don over hips prior to stand pivot bed > recliner with PFRW and Mod A. Pt with psterior lean needing cues to correct. Set up with call bell in reach and all needs met, RUE elevated.  ? ?Therapy Documentation ?Precautions:  ?Precautions ?Precautions: Fall ?Precaution Comments: watch BP, orthostatic hypotension ?Required Braces or Orthoses: Other Brace (R UE cast) ?Other Brace: compression stockings and abdominal binder ?Restrictions ?Weight Bearing Restrictions: Yes ?RUE Weight Bearing: Weight bear through elbow only ?Other Position/Activity Restrictions: R wrist fx and L 5th finger fx,  contacting ortho for WB status ? ? ? ?Therapy/Group: Individual Therapy ? ?Viona Gilmore ?02/17/2022, 12:58 PM ?

## 2022-02-17 NOTE — Progress Notes (Signed)
Patient with dizziness and orthostasis despite binder, TEDs and midodrine. Note bethanecol was increased yesterday --> will d/c. Question decrease home dose Hydrocodone 7.5 to 5 mg if symptoms continue?  Will reach out to cardiology for input.  ?

## 2022-02-17 NOTE — Progress Notes (Signed)
Occupational Therapy Session Note ? ?Patient Details  ?Name: [name] ?MRN: [id number] ?Date of Birth: 07/07/1946 ? ?Today's Date: 02/17/2022 ?OT Individual Time: 1045-1200 ?OT Individual Time Calculation (min): 75 min  ? ? ?Short Term Goals: ?Week 1:  OT Short Term Goal 1 (Week 1): Pt will don pants with LRAD with no more than mod A ?OT Short Term Goal 2 (Week 1): Pt will utilize adaptive strategies to don shirt with (S) ?OT Short Term Goal 3 (Week 1): Pt will complete toilet transfer with CGA ? ?Skilled Therapeutic Interventions/Progress Updates:  ?  Pt resting in bed upon arrival. Pt requested to call his husband but was unable to locate phone # in iPhone. Pt oriented to place and person but conversation was tangential throughout session. BP supine 110/53 and 98/56 seated EOB with lighteheadedness reported. Pt also c/o dizziness which she stated was an "old issue." Pt reports she has been "treated" for dizziness in past. Ted hose donned and shoes. Pt requested to use toilet in bathroom. Sit<>stand with mod A and amb with PFRW to bathroom with mod A and max verbal cues for safety awareness. Pt tot A for toielting tasks and mod A for tranfsers. Pt with slight lean to Lt. Pt returned to EOB. Max A for sit>supine. Pt remained in bed with all needs within reach. Pt wearing hospital gown. Bed alarm activated.  ? ?Therapy Documentation ?Precautions:  ?Precautions ?Precautions: Fall ?Precaution Comments: watch BP, orthostatic hypotension ?Required Braces or Orthoses: Other Brace (R UE cast) ?Other Brace: compression stockings and abdominal binder ?Restrictions ?Weight Bearing Restrictions: Yes ?RUE Weight Bearing: Weight bear through elbow only ?Other Position/Activity Restrictions: R wrist fx and L 5th finger fx, contacting ortho for WB status ?Pain: ? Pt c/o 4/10 HA and generalized pain; RN aware ? ? ?Therapy/Group: Individual Therapy ? ?Leroy Libman ?02/17/2022, 12:06 PM ?

## 2022-02-17 NOTE — Progress Notes (Signed)
Inpatient Rehabilitation  Patient information reviewed and entered into eRehab system by Moniqua Engebretsen M. Chicquita Mendel, M.A., CCC/SLP, PPS Coordinator.  Information including medical coding, functional ability and quality indicators will be reviewed and updated through discharge.    

## 2022-02-17 NOTE — Progress Notes (Signed)
Inpatient Rehabilitation Center ?Individual Statement of Services ? ?Patient Name:  [name]  ?Date:  02/17/2022 ? ?Welcome to the [facility].  Our goal is to provide you with an individualized program based on your diagnosis and situation, designed to meet your specific needs.  With this comprehensive rehabilitation program, you will be expected to participate in at least 3 hours of rehabilitation therapies Monday-Friday, with modified therapy programming on the weekends. ? ?Your rehabilitation program will include the following services:  Physical Therapy (PT), Occupational Therapy (OT), 24 hour per day rehabilitation nursing, Therapeutic Recreaction (TR), Care Coordinator, Rehabilitation Medicine, Nutrition Services, and Pharmacy Services ? ?Weekly team conferences will be held on Tuesday to discuss your progress.  Your Inpatient Rehabilitation Care Coordinator will talk with you frequently to get your input and to update you on team discussions.  Team conferences with you and your family in attendance may also be held. ? ?Expected length of stay: 14-16 days  Overall anticipated outcome: supervision-CGA level ? ?Depending on your progress and recovery, your program may change. Your Inpatient Rehabilitation Care Coordinator will coordinate services and will keep you informed of any changes. Your Inpatient Rehabilitation Care Coordinator's name and contact numbers are listed  below. ? ?The following services may also be recommended but are not provided by the [facility]:  ? ?Home Health Rehabiltiation Services ?Outpatient Rehabilitation Services ? ?  ?Arrangements will be made to provide these services after discharge if needed.  Arrangements include referral to agencies that provide these services. ? ?Your insurance has been verified to be:  Medicare & [employer]etna ?Your primary doctor is:  [name] ? ?Pertinent information will be shared with your doctor and your insurance  company. ? ?Inpatient Rehabilitation Care Coordinator:  Ovidio Kin, [place] or (C) [phone] ? ?Information discussed with and copy given to patient by: Elease Hashimoto, 02/17/2022, 9:42 AM    ?

## 2022-02-17 NOTE — Progress Notes (Signed)
?                                                       PROGRESS NOTE ? ? ?Subjective/Complaints: ? ?Pt reports dizzy with standing up.  ?Better with sitting. But feels very lightheaded when stands up.  ?Also thinks she's wheezing overnight.  ?Thinks might have asthma.  ?Restless sleep overnight per nurse.  ?Requires in/out caths x2 overnight- not voiding.  ? ?ROS:  ?Pt denies SOB, abd pain, CP, N/V/C/D, and vision changes ? ? ?Objective: ?  ?No results found. ?Recent Labs  ?  02/15/22 ?0231  ?WBC 5.3  ?HGB 10.9*  ?HCT 34.4*  ?PLT 253  ? ?Recent Labs  ?  02/15/22 ?0231  ?NA 136  ?K 4.0  ?CL 96*  ?CO2 26  ?GLUCOSE 88  ?BUN 63*  ?CREATININE 2.34*  ?CALCIUM 9.1  ? ? ?Intake/Output Summary (Last 24 hours) at 02/17/2022 0814 ?Last data filed at 02/17/2022 0510 ?Gross per 24 hour  ?Intake 357 ml  ?Output 1350 ml  ?Net -993 ml  ?  ? ?  ? ?Physical Exam: ?Vital Signs ?Blood pressure (!) 92/48, pulse 79, temperature 98.6 ?F (37 ?C), temperature source Oral, resp. rate 16, weight 82.5 kg, SpO2 97 %. ? ? ?General: awake, alert, appropriate, sitting up in bed; c/o dizziness with standing; NAD ?HENT: conjugate gaze; oropharynx moist- no nystagmus seen ?CV: regular rate; no JVD ?Pulmonary: CTA B/L; no W/R/R- slightly decreased at bases; asthma type cough, though ?GI: soft, NT, ND, (+)BS- protuberant ?Psychiatric: appropriate ?Neurological: Ox3 ?. ?Extremities: No clubbing, cyanosis, tr LE edema. Pulses are 2+ ?Psych: Pt's affect is appropriate. Pt is cooperative ?Skin: bilateral LE's/shins each with two 2" diameter wounds which are fairly superficial and with 95% granulation, clean appearing.  ?Neuro:  Alert and oriented x 3. Normal insight and awareness. Intact Memory. Normal language and speech. Cranial nerve exam unremarkable. RUE limited by [facility], LUE 3-4/5. LE 3 prox to 4- distally. Decreased LT in stocking glove pattern ?Musculoskeletal: right arm in [facility], Left 4th/5th digits splinted  ? ? ?Assessment/Plan: ?1. Functional  deficits which require 3+ hours per day of interdisciplinary therapy in a comprehensive inpatient rehab setting. ?Physiatrist is providing close team supervision and 24 hour management of active medical problems listed below. ?Physiatrist and rehab team continue to assess barriers to discharge/monitor patient progress toward functional and medical goals ? ?Care Tool: ? ?Bathing ?   ?Body parts bathed by patient: Right lower leg, Right arm, Left arm, Left lower leg, Chest, Face, Abdomen, Front perineal area, Right upper leg, Left upper leg  ? Body parts bathed by helper: Buttocks ?  ?  ?Bathing assist Assist Level: Moderate Assistance - Patient 50 - 74% ?  ?  ?Upper Body Dressing/Undressing ?Upper body dressing   ?What is the patient wearing?: Pull over shirt ?   ?Upper body assist Assist Level: Moderate Assistance - Patient 50 - 74% ?   ?Lower Body Dressing/Undressing ?Lower body dressing ? ? ?   ?What is the patient wearing?: Incontinence brief ? ?  ? ?Lower body assist Assist for lower body dressing: Total Assistance - Patient < 25% ?   ? ?Toileting ?Toileting    ?Toileting assist Assist for toileting: Total Assistance - Patient < 25% ?  ?  ?Transfers ?Chair/bed transfer ? ?Transfers assist ?   ? ?  Chair/bed transfer assist level: Moderate Assistance - Patient 50 - 74% ?  ?  ?Locomotion ?Ambulation ? ? ?Ambulation assist ? ?   ? ?Assist level: Minimal Assistance - Patient > 75% ?Assistive device: Walker-platform ?Max distance: 5 ft  ? ?Walk 10 feet activity ? ? ?Assist ? Walk 10 feet activity did not occur: Safety/medical concerns (decreased activity tolerance) ? ?  ?   ? ?Walk 50 feet activity ? ? ?Assist Walk 50 feet with 2 turns activity did not occur: Safety/medical concerns ? ?  ?   ? ? ?Walk 150 feet activity ? ? ?Assist Walk 150 feet activity did not occur: Safety/medical concerns ? ?  ?  ?  ? ?Walk 10 feet on uneven surface  ?activity ? ? ?Assist Walk 10 feet on uneven surfaces activity did not occur:  Safety/medical concerns ? ? ?  ?   ? ?Wheelchair ? ? ? ? ?Assist Is the patient using a wheelchair?: Yes ?Type of Wheelchair: Manual ?  ? ?Wheelchair assist level: Dependent - Patient 0% (limited by upper extremity precautions and decreased activity tolerance) ?   ? ? ?Wheelchair 50 feet with 2 turns activity ? ? ? ?Assist ? ?  ?  ? ? ?Assist Level: Dependent - Patient 0%  ? ?Wheelchair 150 feet activity  ? ? ? ?Assist ?   ? ? ?Assist Level: Dependent - Patient 0%  ? ?Blood pressure (!) 92/48, pulse 79, temperature 98.6 ?F (37 ?C), temperature source Oral, resp. rate 16, weight 82.5 kg, SpO2 97 %. ? ?Medical Problem List and Plan: ?1. Functional deficits secondary to debility after congestive heart failure and multiple other med/surg issues ?            -patient may shower if LUE/cast is covered ?            -ELOS/Goals: 14-16 days, supervision goals ? Con't CIR - PT and OT ?2.  Antithrombotics: ?-DVT/anticoagulation:  sq heparin 5000u q8 ?            -antiplatelet therapy: ASA '81mg'$  daily ?3. Pain Management: hydrocodone 7.'5mg'$  qid ?            -prn tylenol ?            -increased scheduled gabapentin to '200mg'$  tid for neuropathic pain ?            -cymbalta ?4. Mood: pt relatively up beat. Team to provide ego support ?            -cymbalta ?            -antipsychotic agents: n/a ?5. Neuropsych: This patient is capable of making decisions on her own behalf. ?6. Skin/Wound Care:   ? -xeroform, kerlix for chronic burn wounds. Wound clean/granulating ?            -pt followed at wound clinic remotely ?            -recent cultures prior to admit + for S Aureus and Pseudomonas ?            -will consult WOC RN for further recs for wound care ? 3/6- WOC suggested topping xeroform with dry gauze and silicone dressings-can use TEDs  ?7. Fluids/Electrolytes/Nutrition: encourage appropriate po ?            -added protein supps ?            -RD f/u ?            -reviewed  importance of nutrition in her recovery ?8. Acute on  chronic CHF: EF 55-60%. Monitor weights daily wit strict I/O ?            -pt diuresed 7L on acute ?            -continue O2 ?            -demadex '20mg'$  daily ?            -zebeta 2.'5mg'$  daily ?9. Acute on chronic renal failure: Baseline SCr 1.4-->continue to moitor ?            --likely due to cardiorenal syndrome, recent contrast and Bactrim. ?10. Hx f HTN, now with Orthostatic Hypotension and sinus tach:  ?-Continue midodrine  ?            -zebeta for rate control ?            -appropriate volume mtc ?            -acclimation with therapies ?            -daily orthostatic vs ?            -abdominal binder ? -?vestibular component, meclizine prn ? 3/6- has midodrine '15mg'$  TID- max dose- and due to fluid status, cannot use Florinef. Still low BP- cannot add Flomax for lack of voiding due to low BP already.  ?11. RLS: requip ?12. Urine retention: still retaining ? -increase urecholine to '10mg'$  tid ?            -oob to void ? -treat fungal UTI per below ? 3/6- cannot add flomax due to low BP.  ?13. Slow transit constipation. No bm since 2/28 ?            -miralax bid, senokot s at hs ? -sorbitol today ? 3/6- LBM overnight- large- con't regimen ?14. GI prophylaxis ?            -protonix bid ?            -carafate tid ?15. Fungal UTI: diflucan '100mg'$  daily thru 3/9 ?16. Recent right wrist fx, left 5th phalanx fx ?            -RUE SAC replaced 3/3--f/u with Dr. Grandville Silos as outpt ?            -left finger splint in place ?17. Class 1 Obesity ?18. Pulmonary HTN: see #8 ? 3/6- will con't Albuterol nebs prn- ask nursing to give.  ?  ? I spent a total of   38  minutes on total care today- >50% coordination of care- due to d/w nursing about albuterol nebs and reviewing complex medical issues/chart.  ? ? ?LOS: ?2 days ?A FACE TO FACE EVALUATION WAS PERFORMED ? ?Edmar Blankenburg ?02/17/2022, 8:14 AM  ? ?  ?

## 2022-02-17 NOTE — Progress Notes (Signed)
Physical Therapy Session Note ? ?Patient Details  ?Name: [name] ?MRN: [id number] ?Date of Birth: [birth year] ? ?Today's Date: 02/17/2022 ?PT Individual Time: 4193-7902 ?PT Individual Time Calculation (min): 50 min  ? ?Short Term Goals: ?Week 1:  PT Short Term Goal 1 (Week 1): Patient will perform bed mobility with min A consistently. ?PT Short Term Goal 2 (Week 1): Patient will perform basic transfers with min A consistently. ?PT Short Term Goal 3 (Week 1): Patient will ambulate >25 feet using LRAD. ?PT Short Term Goal 4 (Week 1): Patient will tolerate static standing with min A with 1-2 upper extremity support >2 min. ? ?Skilled Therapeutic Interventions/Progress Updates:  ?  Pt received seated in recliner in room, agreeable to PT session although she does report ongoing vestibular symptoms with movement. Pt's IV in L forearm noted to be leaking, nursing notified. Pt missed 15 min of scheduled therapy session at start of session to allow time for nursing to remove IV. Pt then received seated in recliner and agreeable to PT session. No complaints of pain during session. Pt requesting to transfer back to bed. Sit to stand with min A to RW during session with assist needed for safe placement of RUE on PFRW. Stand pivot transfer recliner to bed with PFRW and min A, increased time needed due to dizziness with position changes. Pt then reports need to have a BM. Stand pivot transfer bed to [facility] with min to mod A. Pt able to continently void while seated on BSC. Pt is dependent for pericare and brief change. Transfer back to bed with PFRW and mod A. Sit to supine Supervision. Pt left seated in bed with needs in reach, bed alarm in place, husband present. Pt missed 10 min of scheduled therapy session at conclusion of session due to fatigue. ? ?Therapy Documentation ?Precautions:  ?Precautions ?Precautions: Fall ?Precaution Comments: watch BP, orthostatic hypotension ?Required Braces or Orthoses: Other Brace (R UE  cast) ?Other Brace: compression stockings and abdominal binder ?Restrictions ?Weight Bearing Restrictions: Yes ?RUE Weight Bearing: Weight bear through elbow only ?Other Position/Activity Restrictions: R wrist fx and L 5th finger fx, contacting ortho for WB status ?General: ?PT Amount of Missed Time (min): 25 Minutes ?PT Missed Treatment Reason: Nursing care;Patient fatigue ? ? ? ? ? ?Therapy/Group: Individual Therapy ? ? ?Excell Seltzer, PT, DPT, CSRS ? ?02/17/2022, 5:32 PM  ?

## 2022-02-18 LAB — CBC WITH DIFFERENTIAL/PLATELET
Abs Immature Granulocytes: 0.01 10*3/uL (ref 0.00–0.07)
Basophils Absolute: 0.1 10*3/uL (ref 0.0–0.1)
Basophils Relative: 1 %
Eosinophils Absolute: 0.4 10*3/uL (ref 0.0–0.5)
Eosinophils Relative: 7 %
HCT: 31.4 % — ABNORMAL LOW (ref 36.0–46.0)
Hemoglobin: 10.3 g/dL — ABNORMAL LOW (ref 12.0–15.0)
Immature Granulocytes: 0 %
Lymphocytes Relative: 27 %
Lymphs Abs: 1.5 10*3/uL (ref 0.7–4.0)
MCH: 32.3 pg (ref 26.0–34.0)
MCHC: 32.8 g/dL (ref 30.0–36.0)
MCV: 98.4 fL (ref 80.0–100.0)
Monocytes Absolute: 0.5 10*3/uL (ref 0.1–1.0)
Monocytes Relative: 9 %
Neutro Abs: 2.9 10*3/uL (ref 1.7–7.7)
Neutrophils Relative %: 56 %
Platelets: 221 10*3/uL (ref 150–400)
RBC: 3.19 MIL/uL — ABNORMAL LOW (ref 3.87–5.11)
RDW: 15 % (ref 11.5–15.5)
WBC: 5.4 10*3/uL (ref 4.0–10.5)
nRBC: 0 % (ref 0.0–0.2)

## 2022-02-18 LAB — COMPREHENSIVE METABOLIC PANEL
ALT: 14 U/L (ref 0–44)
AST: 17 U/L (ref 15–41)
Albumin: 2.6 g/dL — ABNORMAL LOW (ref 3.5–5.0)
Alkaline Phosphatase: 90 U/L (ref 38–126)
Anion gap: 13 (ref 5–15)
BUN: 69 mg/dL — ABNORMAL HIGH (ref 8–23)
CO2: 28 mmol/L (ref 22–32)
Calcium: 9.5 mg/dL (ref 8.9–10.3)
Chloride: 95 mmol/L — ABNORMAL LOW (ref 98–111)
Creatinine, Ser: 2.83 mg/dL — ABNORMAL HIGH (ref 0.44–1.00)
GFR, Estimated: 17 mL/min — ABNORMAL LOW (ref >60)
Glucose, Bld: 88 mg/dL (ref 70–99)
Potassium: 3.7 mmol/L (ref 3.5–5.1)
Sodium: 136 mmol/L (ref 135–145)
Total Bilirubin: 0.7 mg/dL (ref 0.3–1.2)
Total Protein: 5.9 g/dL — ABNORMAL LOW (ref 6.5–8.1)

## 2022-02-18 LAB — ETHANOL: Alcohol, Ethyl (B): <10 mg/dL (ref <10)

## 2022-02-18 LAB — VITAMIN B12: Vitamin B-12: 596 pg/mL (ref 180–914)

## 2022-02-18 MED ORDER — MELATONIN 5 MG PO TABS
5.0000 mg | ORAL_TABLET | Freq: Every day | ORAL | Status: DC
  Administered 2022-02-18 – 2022-03-03 (×14): 5 mg via ORAL
  Filled 2022-02-18 (×14): qty 1

## 2022-02-18 MED ORDER — TRAZODONE HCL 50 MG PO TABS: 50.0000 mg | ORAL_TABLET | Freq: Every day | ORAL | Status: DC

## 2022-02-18 NOTE — Progress Notes (Signed)
Physical Therapy Session Note ? ?Patient Details  ?Name: [name] ?MRN: [id number] ?Date of Birth: [birth year] ? ?Today's Date: 02/18/2022 ?PT Individual Time: 1000-1030, 1400-1500 ?PT Individual Time Calculation (min): 30 min, 60 min  ? ?Short Term Goals: ?Week 1:  PT Short Term Goal 1 (Week 1): Patient will perform bed mobility with min A consistently. ?PT Short Term Goal 2 (Week 1): Patient will perform basic transfers with min A consistently. ?PT Short Term Goal 3 (Week 1): Patient will ambulate >25 feet using LRAD. ?PT Short Term Goal 4 (Week 1): Patient will tolerate static standing with min A with 1-2 upper extremity support >2 min. ? ?Skilled Therapeutic Interventions/Progress Updates:  ?  Session 1: ?pt received in bed and agreeable to therapy. Pt reports neuropathic pain, premedicated. Rest and positioning provided as needed. Consulted RN, limited medication options d/t pt's BP. Session focused on BPPV testing and treatment. Performed R dix hallpike and epley maneuver, however pt with limited range into L cervical rotation so unsure if treatment was effective. No saccades noted but pt reported dizziness during treatment. Vertical and horizontal saccades noted with VOR testing. Pt returned to bed after session with max A and was left with all needs in reach and alarm active.  ? ?Session 2: ?Pt received in recliner and agreeable to therapy.  No complaint of pain. Abd binder and ted hose in place. Pt requesting to brush teeth, so ambulatory transfer to sink with mod A. Sit to stand from recliner mod A and from low w/c with max A. Pt stood for several minutes to brush teeth, but became symptomatic, no responding coherently to questions and leaning onto sink. Once sitting, pt immediately perked up and stated she had felt dizzy. Seated BP 114/54, standing BP 96/29 and quickly symptomatic. Minimized standing activity for rest of session d/t BP. Pt performed seated marches, LAQ, and ankle pumps to increase pressures  through activity in safe position. Pt asked to use BSC, so HHA Stand pivot transfer to commode to minimize standing time. At this time RN arrived for meds pass and took over care d/t end of session. Consulted RN on BP with plan to recheck using stedy after toileting.  ? ?Note, while looking for pt's tooth brush, discovered bottle of wine in closet. When asked, she said she didn't know whose it was. RN opted to remove from room and send home with her husband when he arrives.  ? ?Therapy Documentation ?Precautions:  ?Precautions ?Precautions: Fall ?Precaution Comments: watch BP, orthostatic hypotension ?Required Braces or Orthoses: Other Brace (R UE cast) ?Other Brace: compression stockings and abdominal binder ?Restrictions ?Weight Bearing Restrictions: Yes ?RUE Weight Bearing: Weight bear through elbow only ?Other Position/Activity Restrictions: R wrist fx and L 5th finger fx, contacting ortho for WB status ?General: ?  ? ? ? ? ?Therapy/Group: Individual Therapy ? ?Hoot Owl ?02/18/2022, 12:49 PM  ?

## 2022-02-18 NOTE — IPOC Note (Signed)
Overall Plan of Care (IPOC) ?Patient Details ?Name: [name] ?MRN: [id number] ?DOB: [birth year] ? ?Admitting Diagnosis: Debility ? ?Hospital Problems: Principal Problem: ?  Debility ? ? ? ? Functional Problem List: ?Nursing Edema, Endurance, Perception, Safety, Skin Integrity, Motor  ?PT Balance, Safety, Behavior, Edema, Sensory, Skin Integrity, Endurance, Motor, Nutrition, Pain, Perception  ?OT Balance, Safety, Sensory, Cognition, Endurance, Motor, Pain  ?SLP    ?TR    ?    ? Basic ADL?s: ?OT Bathing, Dressing, Toileting, Grooming  ? ?  Advanced  ADL?s: ?OT    ?   ?Transfers: ?PT Bed Mobility, Bed to Chair, Car, Furniture, Floor  ?OT Tub/Shower, Toilet  ? ?  Locomotion: ?PT Ambulation, Wheelchair Mobility, Stairs  ? ?  Additional Impairments: ?OT None  ?SLP   ?  ?   ?TR    ? ? ?Anticipated Outcomes ?Item Anticipated Outcome  ?Self Feeding no goal set  ?Swallowing ?   ?  ?Basic self-care ? (S)  ?Toileting ? (S) ?  ?Bathroom Transfers (S)  ?Bowel/Bladder ? N/A  ?Transfers ? CGA using LRAD  ?Locomotion ? CGA using LRAD 50 ft  ?Communication ?    ?Cognition ?    ?Pain ? N/A  ?Safety/Judgment ? supervision  ? ?Therapy Plan: ?PT Intensity: Minimum of 1-2 x/day ,45 to 90 minutes ?PT Frequency: 5 out of 7 days ?PT Duration Estimated Length of Stay: 14-16 days ?OT Intensity: Minimum of 1-2 x/day, 45 to 90 minutes ?OT Frequency: 5 out of 7 days ?OT Duration/Estimated Length of Stay: 10-14 days ?   ? ?Due to the current state of emergency, patients may not be receiving their 3-hours of Medicare-mandated therapy. ? ? Team Interventions: ?Nursing Interventions Patient/Family Education, Disease Management/Prevention, Medication Management, Skin Care/Wound Management, Discharge Planning  ?PT interventions Ambulation/gait training, Cognitive remediation/compensation, Discharge planning, DME/adaptive equipment instruction, Functional mobility training, Pain management, Psychosocial support, Therapeutic Activities,  Splinting/orthotics, UE/LE Strength taining/ROM, Visual/perceptual remediation/compensation, Wheelchair propulsion/positioning, UE/LE Coordination activities, Therapeutic Exercise, Stair training, Skin care/wound management, Patient/family education, Neuromuscular re-education, Functional electrical stimulation, Disease management/prevention, Academic librarian, Training and development officer  ?OT Interventions Balance/vestibular training, Discharge planning, Pain management, Self Care/advanced ADL retraining, Therapeutic Activities, UE/LE Coordination activities, Visual/perceptual remediation/compensation, Therapeutic Exercise, Skin care/wound managment, Cognitive remediation/compensation, Disease mangement/prevention, Functional mobility training, Patient/family education, Community reintegration, DME/adaptive equipment instruction, Psychosocial support, UE/LE Strength taining/ROM, Wheelchair propulsion/positioning, Splinting/orthotics  ?SLP Interventions    ?TR Interventions    ?SW/CM Interventions Discharge Planning, Psychosocial Support, Patient/Family Education  ? ?Barriers to Discharge ?MD  Medical stability, Home enviroment access/loayout, Neurogenic bowel and bladder, Wound care, Lack of/limited family support, Weight, and Weight bearing restrictions  ?Nursing Decreased caregiver support, Wound Care, Lack of/limited family support, Weight ?1 level, level entry. Spouse can provide supervision 24/7  ?PT Wound Care, Weight, Behavior, Weight bearing restrictions ?   ?OT   ?   ?SLP   ?   ?SW   ?   ? ?Team Discharge Planning: ?Destination: PT-Home ,OT- Home , SLP-  ?Projected Follow-up: PT-Home health PT, OT-  Home health OT, SLP-  ?Projected Equipment Needs: PT-To be determined, OT- To be determined, SLP-  ?Equipment Details: PT- , OT-  ?Patient/family involved in discharge planning: PT- Patient, Family member/caregiver,  OT-Family member/caregiver, Patient, SLP-  ? ?MD ELOS: 14-16 days ?Medical Rehab  Prognosis:  Fair ?Assessment:   ? ?The patient has been admitted for CIR therapies with the diagnosis of debility due to hypotension and dCHF s/p diuresis of 7L. Calling Cardiology to help with  severe hypotension which is limiting therapies. The team will be addressing functional mobility, strength, stamina, balance, safety, adaptive techniques and equipment, self-care, bowel and bladder mgt, patient and caregiver education, and work with neuropathy- difficulty feeding herself. . Goals have been set at [facility] to supervision. Anticipated discharge destination is home. ? ?Due to the current state of emergency, patients may not be receiving their 3 hours per day of Medicare-mandated therapy.  ? ? ? ? ?See Team Conference Notes for weekly updates to the plan of care ? ?

## 2022-02-18 NOTE — Progress Notes (Signed)
Report given to PA. Dan , no new orders at this time.  ?

## 2022-02-18 NOTE — Progress Notes (Signed)
Patient continues to have orthostatic changes but does recover with binder and TEDs with SBP in high 90's. Discussed patient with cardiology PA yesterday who recommended holding demadex X 1 (held today). She was bolused with 250 cc yesterday and has been encouraged to push fluids but renal status worse this am. I discontinued trazodone and robaxin. Added melatonin to help with sleep (was restless last night and did not use CPAP). Will recheck labs tomorrow. Reached out to cardiology again who recommended discontinuation of Zebeta.  ?

## 2022-02-18 NOTE — Patient Care Conference (Signed)
Inpatient RehabilitationTeam Conference and Plan of Care Update ?Date: 02/18/2022   Time: 11:30 AM  ? ? ?Patient Name: [name]      ?Medical Record Number: [id number]  ?Date of Birth: Jun 11, 1946 ?Sex: Female         ?Room/Bed: 4M12C/4M12C-01 ?Payor Info: Payor: MEDICARE / Plan: MEDICARE PART A AND B / Product Type: *No Product type* /   ? ?Admit Date/Time:  02/15/2022  2:12 PM ? ?Primary Diagnosis:  Debility ? ?Hospital Problems: Principal Problem: ?  Debility ? ? ? ?Expected Discharge Date: Expected Discharge Date: 03/04/22 ? ?Team Members Present: ?Physician leading conference: Dr. Courtney Heys ?Social Worker Present: Ovidio Kin, LCSW ?Nurse Present: Dorthula Nettles, RN ?PT Present: Ailene Rud, PT ?OT Present: Lillia Corporal, OT ?PPS Coordinator present : Gunnar Fusi, SLP ? ?   Current Status/Progress Goal Weekly Team Focus  ?Bowel/Bladder ? ? cont x2. last BM- 3/6. I/O 3/6- 400 cc. Pt doesn't empty the bladder  able to empty the bladder completly  assess q shift and PRN   ?Swallow/Nutrition/ Hydration ? ?           ?ADL's ? ? Max/total LB, Mod stand pivot with PFRW, OH very limiting, poor cognition and STM  MIN toilet, Supervision transfers  ADL retraining, OOB tolerance, standing balance, global endurance   ?Mobility ? ? mod A overall, limited by BPPV and OH  CGA overall  OOB tolerance, BPPV, and transfers   ?Communication ? ?           ?Safety/Cognition/ Behavioral Observations ?           ?Pain ? ? c/o pain legs, arms. pain managm. in progress, effective  pain<3  assess pain q shift and PRN   ?Skin ? ? multip. eccymotac areas to BUE, abdomen, BLE burn from UV,  proper healing.  assess skin q shift and continue current treatment.   ? ? ?Discharge Planning:  ?HOme with husband who would like her to be able to go back and forth to the bathroom on her own. Will confirm he can provide 24/7 care at DC   ?Team Discussion: ?On the max dose of Midodrine TID. Discontinued Urecholine, not voiding. Cannot feed self  due to neuropathy. Was hungry this morning. Weight is down 1 kg. Cr elevated. BP 85/53 sitting. Having to I&O cath patient. Confused at [facility], continues to take CPAP off at HS. Reports 7/10 pain to right leg. Restless at night. Continue to maintain IV. Non-pressure wounds from UV light. May order brain MRI. When SOB, give Albuterol treatment. Husband wants patient to be Mod I to go to the bathroom.  ? ?Patient on target to meet rehab goals: ?yes, min assist toileting, supervision transfers. Currently mod assist with ambulation. 98/49 with ted's and abdominal binder. Stood up and spaced out a little. Issues with short-term memory. Has posterior lean. Has vestibular issues. Vestibular Occular Reflex (VOR), issues. Request SLP eval.  ? ?*See Care Plan and progress notes for long and short-term goals.  ? ?Revisions to Treatment Plan:  ?Adjusting medications ?  ?Teaching Needs: ?Family education, medication/pain management, skin/wound care, bladder management, transfer/gait training, etc. ?  ?Current Barriers to Discharge: ?Decreased caregiver support, Home enviroment access/layout, Incontinence, Wound care, Lack of/limited family support, Weight, and Weight bearing restrictions ? ?Possible Resolutions to Barriers: ?Family education ?Follow-up PT/OT ?Order recommended DME ?  ? ? 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 Barriers to Discharge: Behavior;Home enviroment access/layout;Neurogenic Bowel & Bladder;Decreased family/caregiver support;Medication compliance;Medical stability;Wound care;Weight bearing restrictions ? Barriers to Discharge Comments: L arm fracture- husband wants her at mod I- also has vestibular issues- BPPV (+)- vestibular ocular reflex issues; posterior lean/poor sequencing; ?Possible Resolutions to [employer] Focus: max-total LB; limited due to hypotension-  severe drop in BP; highest 98/49-poor ST memory, posteiror lean; poor sequencing; fopcus on trying to work up why having Sx's c/w cognitive issues- SNF vs home- 3/21 ? ? ?Continued Need for Acute Rehabilitation Level of Care: The patient requires daily medical management by a physician with specialized training in physical medicine and rehabilitation for the following reasons: ?Direction of a multidisciplinary physical rehabilitation program to maximize functional independence : Yes ?Medical management of patient stability for increased activity during participation in an intensive rehabilitation regime.: Yes ?Analysis of laboratory values and/or radiology reports with any subsequent need for medication adjustment and/or medical intervention. : Yes ? ? ?I attest that I was present, lead the team conference, and concur with the assessment and plan of the team. ? ? ?Dorthula Nettles G ?02/18/2022, 4:46 PM  ? ? ? ? ? ? ?

## 2022-02-18 NOTE — Progress Notes (Signed)
PROGRESS NOTE   Subjective/Complaints:  Pt reports still having dizziness Needs help to feed herself due to neuropathy in her hands- cannot hold food or fork.   Per nurse, pt restless and somewhat confused overnight. Kept trying to leave bed.  Peed some on toilet, however still required in/out cath x2 for 350cc and 375 cc.  Appetite better per pt, but hungry since cannot feed self- asked NT's to help.    ROS: Pt denies SOB, abd pain, CP, N/V/C/D, and vision changes- as above.     Objective:   No results found. Recent Labs    02/18/22 0532  WBC 5.4  HGB 10.3*  HCT 31.4*  PLT 221   Recent Labs    02/18/22 0532  NA 136  K 3.7  CL 95*  CO2 28  GLUCOSE 88  BUN 69*  CREATININE 2.83*  CALCIUM 9.5    Intake/Output Summary (Last 24 hours) at 02/18/2022 0160 Last data filed at 02/18/2022 0700 Gross per 24 hour  Intake 479.21 ml  Output 800 ml  Net -320.79 ml        Physical Exam: Vital Signs Blood pressure (!) 85/53, pulse 84, temperature 98.1 F (36.7 C), resp. rate 18, height '5\' 7"'$  (1.702 m), weight 81.1 kg, SpO2 92 %.    General: awake, alert, appropriate, vague and tangential; sitting up in bed; trying to feed self- not getting far- ate ~25% of meal; kept dropping it; NAD HENT: conjugate gaze; oropharynx dry CV: regular rate; no JVD Pulmonary: CTA B/L; no W/R/R- good air movement GI: soft, NT, ND, (+)BS Psychiatric: appropriate but vague/tangential Neurological:overall alert;   Extremities: No clubbing, cyanosis, tr LE edema. Pulses are 2+ Skin: bilateral LE's/shins each with two 2" diameter wounds which are fairly superficial and with 95% granulation, clean appearing.  Neuro:  Alert and oriented x 3. Normal insight and awareness. Intact Memory. Normal language and speech. Cranial nerve exam unremarkable. RUE limited by [facility], LUE 3-4/5. LE 3 prox to 4- distally. Decreased LT in stocking glove  pattern Musculoskeletal: right arm in [employer], Left 4th/5th digits splinted    Assessment/Plan: 1. Functional deficits which require 3+ hours per day of interdisciplinary therapy in a comprehensive inpatient rehab setting. Physiatrist is providing close team supervision and 24 hour management of active medical problems listed below. Physiatrist and rehab team continue to assess barriers to discharge/monitor patient progress toward functional and medical goals  Care Tool:  Bathing    Body parts bathed by patient: Right lower leg, Right arm, Left arm, Left lower leg, Chest, Face, Abdomen, Front perineal area, Right upper leg, Left upper leg   Body parts bathed by helper: Buttocks     Bathing assist Assist Level: Moderate Assistance - Patient 50 - 74%     Upper Body Dressing/Undressing Upper body dressing   What is the patient wearing?: Pull over shirt    Upper body assist Assist Level: Moderate Assistance - Patient 50 - 74%    Lower Body Dressing/Undressing Lower body dressing      What is the patient wearing?: Incontinence brief     Lower body assist Assist for lower body dressing: Total Assistance - Patient <  25%     Toileting Toileting    Toileting assist Assist for toileting: Total Assistance - Patient < 25%     Transfers Chair/bed transfer  Transfers assist     Chair/bed transfer assist level: Moderate Assistance - Patient 50 - 74%     Locomotion Ambulation   Ambulation assist      Assist level: Minimal Assistance - Patient > 75% Assistive device: Walker-platform Max distance: 5 ft   Walk 10 feet activity   Assist  Walk 10 feet activity did not occur: Safety/medical concerns (decreased activity tolerance)        Walk 50 feet activity   Assist Walk 50 feet with 2 turns activity did not occur: Safety/medical concerns         Walk 150 feet activity   Assist Walk 150 feet activity did not occur: Safety/medical concerns         Walk  10 feet on uneven surface  activity   Assist Walk 10 feet on uneven surfaces activity did not occur: Safety/medical concerns         Wheelchair     Assist Is the patient using a wheelchair?: Yes Type of Wheelchair: Manual    Wheelchair assist level: Dependent - Patient 0% (limited by upper extremity precautions and decreased activity tolerance)      Wheelchair 50 feet with 2 turns activity    Assist        Assist Level: Dependent - Patient 0%   Wheelchair 150 feet activity     Assist      Assist Level: Dependent - Patient 0%   Blood pressure (!) 85/53, pulse 84, temperature 98.1 F (36.7 C), resp. rate 18, height '5\' 7"'$  (1.702 m), weight 81.1 kg, SpO2 92 %.  Medical Problem List and Plan: 1. Functional deficits secondary to debility after congestive heart failure and multiple other med/surg issues             -patient may shower if LUE/cast is covered             -ELOS/Goals: 14-16 days, supervision goals  Team conference today to determine length of stay  Con't CIR- PT and OT-  2.  Antithrombotics: -DVT/anticoagulation:  sq heparin 5000u q8             -antiplatelet therapy: ASA '81mg'$  daily 3. Pain Management: hydrocodone 7.'5mg'$  qid             -prn tylenol             -increased scheduled gabapentin to '200mg'$  tid for neuropathic pain             -cymbalta  3/7- pain controlled usually- however due to low BP, staff concerned about giving Norco- will d/w team 4. Mood: pt relatively up beat. Team to provide ego support             -cymbalta             -antipsychotic agents: n/a 5. Neuropsych: This patient is capable of making decisions on her own behalf. 6. Skin/Wound Care:    -xeroform, kerlix for chronic burn wounds. Wound clean/granulating             -pt followed at wound clinic remotely             -recent cultures prior to admit + for S Aureus and Pseudomonas             -will consult WOC RN for further recs for  wound care  3/6- WOC suggested  topping xeroform with dry gauze and silicone dressings-can use TEDs  7. Fluids/Electrolytes/Nutrition: encourage appropriate po             -added protein supps             -RD f/u             -reviewed importance of nutrition in her recovery 8. Acute on chronic CHF: EF 55-60%. Monitor weights daily wit strict I/O             -pt diuresed 7L on acute             -continue O2             -demadex '20mg'$  daily             -zebeta 2.'5mg'$  daily  3/7- down to 1kg- however Cr up to 2.83- will call Cards due to low BP on max dose of Midodrine and Cr issues 9. Acute on chronic renal failure: Baseline SCr 1.4-->continue to moitor             --likely due to cardiorenal syndrome, recent contrast and Bactrim. 10. Hx f HTN, now with Orthostatic Hypotension and sinus tach:  -Continue midodrine              -zebeta for rate control             -appropriate volume mtc             -acclimation with therapies             -daily orthostatic vs             -abdominal binder  -?vestibular component, meclizine prn  3/6- has midodrine '15mg'$  TID- max dose- and due to fluid status, cannot use Florinef. Still low BP- cannot add Flomax for lack of voiding due to low BP already.   3/7- went over this with pt- will call Cards.  11. RLS: requip 12. Urine retention: still retaining  -increase urecholine to '10mg'$  tid             -oob to void  -treat fungal UTI per below  3/6- cannot add flomax due to low BP. 3/7- Urecholine stopped due to low BP- still requiring in/out caths, but went over difficulty with pt since cannot use meds to treat Sx's.   13. Slow transit constipation. No bm since 2/28             -miralax bid, senokot s at hs  -sorbitol today  3/6- LBM overnight- large- con't regimen 14. GI prophylaxis             -protonix bid             -carafate tid 15. Fungal UTI: diflucan '100mg'$  daily thru 3/9 16. Recent right wrist fx, left 5th phalanx fx             -RUE SAC replaced 3/3--f/u with Dr. Grandville Silos as  outpt             -left finger splint in place 17. Class 1 Obesity 18. Pulmonary HTN: see #8  3/6- will con't Albuterol nebs prn- ask nursing to give.    I spent a total of  41  minutes on total care today- >50% coordination of care- due to team conference and d/w therapy about lightheadedness. Will check with Cards.   Also did IPOC   LOS: 3 days A FACE TO FACE  EVALUATION WAS PERFORMED  Jeanne Diefendorf 02/18/2022, 8:22 AM
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
## 2022-02-18 NOTE — Progress Notes (Signed)
Occupational Therapy Session Note ? ?Patient Details  ?Name: [name] ?MRN: [id number] ?Date of Birth: April 08, 1946 ? ?Today's Date: 02/18/2022 ?OT Individual Time: 4403-4742 ?OT Individual Time Calculation (min): 55 min  ? ? ?Short Term Goals: ?Week 1:  OT Short Term Goal 1 (Week 1): Pt will don pants with LRAD with no more than mod A ?OT Short Term Goal 2 (Week 1): Pt will utilize adaptive strategies to don shirt with (S) ?OT Short Term Goal 3 (Week 1): Pt will complete toilet transfer with CGA ? ? ?Skilled Therapeutic Interventions/Progress Updates:  ?  Pt greeted at time of session semireclined bed level with legs restless and twitching, pt requesting pain meds for RUE pain. Discussed with nurse and nurse stating that pt is not to have further pain medication 2/2 low BP, and relayed this to the pt. Upon re-entering room legs no longer restless. Pt agreeable to ADL tasks and donned thigh high TEDS prior to activity with total A. Pt performed supine > sit Min A and sitting EOB with some c/o dizziness, BP checked with TEDS only sitting EOB 79/64. Donned binder and BP 98/49. Extended time sitting EOB with frequent and persistent posterior lean and max difficulty correcting while pt taking morning meds. Thread pants with Max A and donned shoed with assist for time management. Sit > stand with Mod A for therapist to don over hips, attempted to check BP in standing with TEDS and binder but pt unable to tolerate standing and saying feeling "flush" and pt returned to sitting and pt feeling much improved, BP not significantly changed by the time able to check it. Pt returned to supine and HOB elevated. Alarm on call bell in reach and nurse aware.  ? ?Therapy Documentation ?Precautions:  ?Precautions ?Precautions: Fall ?Precaution Comments: watch BP, orthostatic hypotension ?Required Braces or Orthoses: Other Brace (R UE cast) ?Other Brace: compression stockings and abdominal binder ?Restrictions ?Weight Bearing Restrictions:  Yes ?RUE Weight Bearing: Weight bear through elbow only ?Other Position/Activity Restrictions: R wrist fx and L 5th finger fx, contacting ortho for WB status ? ? ? ? ?Therapy/Group: Individual Therapy ? ?Viona Gilmore ?02/18/2022, 7:11 AM ?

## 2022-02-18 NOTE — Progress Notes (Signed)
Occupational Therapy Session Note ? ?Patient Details  ?Name: [name] ?MRN: [id number] ?Date of Birth: 09-19-1946 ? ?Today's Date: 02/18/2022 ?OT Individual Time: 3785-8850 ?OT Individual Time Calculation (min): 29 min  ? ? ?Short Term Goals: ?Week 1:  OT Short Term Goal 1 (Week 1): Pt will don pants with LRAD with no more than mod A ?OT Short Term Goal 2 (Week 1): Pt will utilize adaptive strategies to don shirt with (S) ?OT Short Term Goal 3 (Week 1): Pt will complete toilet transfer with CGA ? ?Skilled Therapeutic Interventions/Progress Updates:  ?Skilled OT intervention completed with focus on dressing, functional sit > stands. Pt received seated in recliner, with food spillage all over her, the abdominal binder and pants from lunch. Pt requesting to change clothes, with ortho tech delivering new abdominal binder to her room. Pt denied pain. Pt required mod A for doffing/donning new shirt, with total A for donning new binder. Education provided to pt about hemi-technique for dressing with multimodal cues required for command following. Sit > stand with Mod A using PFRW, with cues needed to push up from chair on L to non weight bear on R wrist. Pt was perseverative on there being someone inside her "other room", with cues required to orient pt to no one being in her bathroom and to inform pt that it is a restroom not another room. Pt required max A for donning pants over hips, with min A needed for balance. Pt was left upright in recliner, with legs elevated, and belt alarm unable to be found in room so therapist notified NT of pt status and without belt alarm on. Pt's immediate needs met and in reach at end of session. ? ? ?Therapy Documentation ?Precautions:  ?Precautions ?Precautions: Fall ?Precaution Comments: watch BP, orthostatic hypotension ?Required Braces or Orthoses: Other Brace (R UE cast) ?Other Brace: compression stockings and abdominal binder ?Restrictions ?Weight Bearing Restrictions: Yes ?RUE  Weight Bearing: Weight bear through elbow only ?Other Position/Activity Restrictions: R wrist fx and L 5th finger fx, contacting ortho for WB status ? ? ?Therapy/Group: Individual Therapy ? ?Royanne Warshaw E Ermel Verne ?02/18/2022, 7:39 AM ?

## 2022-02-18 NOTE — Progress Notes (Signed)
Patient ID: [name], female   DOB: December 06, 1946, 76 y.o.   MRN: [id number]  [place] with husband who reports he can see she is confused and she was not this bad at home prior to admission. She was having difficulty at home and falling, getting off of the commode and would be dizzy once got up. Husband is not sure he can provide the assist she will need at discharge. Husband reports he has health issues-bad knees and bad back. Discussed team is recommending care at discharge. He is not sure he can do this. Will work with on this and if husband does not feel he can manage her at home will need to either pursue private duty or SNF. He is aware of both options and pt reports: " I do not want to do that." Will see if BP can be more stable pt can participate more in therapies and make more progress. ?

## 2022-02-18 NOTE — Progress Notes (Signed)
Patient didn't sleep well last night, was restless, constantly removing her CIPAP. Patient was redirected and educated multiple times. No c/o dizziness last night and this morning. Pt was cathed x 2 . Patient was taking to the bathroom, able to void little bit. Bladder scan volume post void >350cc. . Pt 's BP this morning runs soft, pain medication held. Patient agrees, denies pain at this time. No acute distress. Complete care rendered.  ?

## 2022-02-19 LAB — BASIC METABOLIC PANEL
Anion gap: 11 (ref 5–15)
BUN: 66 mg/dL — ABNORMAL HIGH (ref 8–23)
CO2: 26 mmol/L (ref 22–32)
Calcium: 9.7 mg/dL (ref 8.9–10.3)
Chloride: 98 mmol/L (ref 98–111)
Creatinine, Ser: 2.56 mg/dL — ABNORMAL HIGH (ref 0.44–1.00)
GFR, Estimated: 19 mL/min — ABNORMAL LOW (ref >60)
Glucose, Bld: 90 mg/dL (ref 70–99)
Potassium: 3.8 mmol/L (ref 3.5–5.1)
Sodium: 135 mmol/L (ref 135–145)

## 2022-02-19 NOTE — Progress Notes (Signed)
Pt stated dizziness upon standing to ambulate to the [facility]. Once placed back into bed, [name] elevated patient's legs and BP returned to normal range. ? ?Gladstone Lighter, LPN ? ?

## 2022-02-19 NOTE — Progress Notes (Signed)
PROGRESS NOTE   Subjective/Complaints:  Pt reports was able to feed herself today- doing better with holding her food/fork.  Slept well- kept CPAP on.  Said BP was better in therapy yesterday.  Still requiring in/out caths- is frustrated about that, but explained likely due to recent fungal UTI.   Clearing throat a little- swallowed "wrong".   ROS:  Pt denies SOB, abd pain, CP, N/V/C/D, and vision changes     Objective:   No results found. Recent Labs    02/18/22 0532  WBC 5.4  HGB 10.3*  HCT 31.4*  PLT 221   Recent Labs    02/18/22 0532 02/19/22 0629  NA 136 135  K 3.7 3.8  CL 95* 98  CO2 28 26  GLUCOSE 88 90  BUN 69* 66*  CREATININE 2.83* 2.56*  CALCIUM 9.5 9.7    Intake/Output Summary (Last 24 hours) at 02/19/2022 0808 Last data filed at 02/19/2022 0500 Gross per 24 hour  Intake 240 ml  Output 650 ml  Net -410 ml        Physical Exam: Vital Signs Blood pressure (!) 101/52, pulse 82, temperature 97.8 F (36.6 C), temperature source Oral, resp. rate 17, height '5\' 7"'$  (1.702 m), weight 80.8 kg, SpO2 97 %.     General: awake, alert, appropriate, sitting up in bed; ate ~75% of tray today; holding food and not dropping today;  NAD HENT: conjugate gaze; oropharynx moist CV: regular rate; no JVD Pulmonary: CTA B/L; no W/R/R- good air movement in spite of clearing throat like swallowed "wrong".  GI: soft, NT, ND, (+)BS Psychiatric: appropriate Neurological: alert- more than yesterday Extremities: No clubbing, cyanosis, tr LE edema. Pulses are 2+ Skin: bilateral LE's/shins each with two 2" diameter wounds which are fairly superficial and with 95% granulation, clean appearing. C/D/I today- just changed this AM Neuro:  Alert and oriented x 3. Normal insight and awareness. Intact Memory. Normal language and speech. Cranial nerve exam unremarkable. RUE limited by [facility], LUE 3-4/5. LE 3 prox to 4- distally.  Decreased LT in stocking glove pattern Musculoskeletal: right arm in [place], Left 4th/5th digits splinted    Assessment/Plan: 1. Functional deficits which require 3+ hours per day of interdisciplinary therapy in a comprehensive inpatient rehab setting. Physiatrist is providing close team supervision and 24 hour management of active medical problems listed below. Physiatrist and rehab team continue to assess barriers to discharge/monitor patient progress toward functional and medical goals  Care Tool:  Bathing    Body parts bathed by patient: Right lower leg, Right arm, Left arm, Left lower leg, Chest, Face, Abdomen, Front perineal area, Right upper leg, Left upper leg   Body parts bathed by helper: Buttocks     Bathing assist Assist Level: Moderate Assistance - Patient 50 - 74%     Upper Body Dressing/Undressing Upper body dressing   What is the patient wearing?: Pull over shirt    Upper body assist Assist Level: Moderate Assistance - Patient 50 - 74%    Lower Body Dressing/Undressing Lower body dressing      What is the patient wearing?: Pants     Lower body assist Assist for lower body dressing:  Total Assistance - Patient < 25%     Toileting Toileting    Toileting assist Assist for toileting: Total Assistance - Patient < 25%     Transfers Chair/bed transfer  Transfers assist     Chair/bed transfer assist level: Moderate Assistance - Patient 50 - 74%     Locomotion Ambulation   Ambulation assist      Assist level: Minimal Assistance - Patient > 75% Assistive device: Walker-platform Max distance: 5 ft   Walk 10 feet activity   Assist  Walk 10 feet activity did not occur: Safety/medical concerns (decreased activity tolerance)        Walk 50 feet activity   Assist Walk 50 feet with 2 turns activity did not occur: Safety/medical concerns         Walk 150 feet activity   Assist Walk 150 feet activity did not occur: Safety/medical  concerns         Walk 10 feet on uneven surface  activity   Assist Walk 10 feet on uneven surfaces activity did not occur: Safety/medical concerns         Wheelchair     Assist Is the patient using a wheelchair?: Yes Type of Wheelchair: Manual    Wheelchair assist level: Dependent - Patient 0% (limited by upper extremity precautions and decreased activity tolerance)      Wheelchair 50 feet with 2 turns activity    Assist        Assist Level: Dependent - Patient 0%   Wheelchair 150 feet activity     Assist      Assist Level: Dependent - Patient 0%   Blood pressure (!) 101/52, pulse 82, temperature 97.8 F (36.6 C), temperature source Oral, resp. rate 17, height '5\' 7"'$  (1.702 m), weight 80.8 kg, SpO2 97 %.  Medical Problem List and Plan: 1. Functional deficits secondary to debility after congestive heart failure and multiple other med/surg issues             -patient may shower if LUE/cast is covered             -ELOS/Goals: 14-16 days, supervision goals  D/c date3/21  Con't CIR- PT and OT- and added SLP eval for today- if (+), will order brain MRI.  2.  Antithrombotics: -DVT/anticoagulation:  sq heparin 5000u q8             -antiplatelet therapy: ASA '81mg'$  daily 3. Pain Management: hydrocodone 7.'5mg'$  qid             -prn tylenol             -increased scheduled gabapentin to '200mg'$  tid for neuropathic pain             -cymbalta  3/7- pain controlled usually- however due to low BP, staff concerned about giving Norco- will d/w team 4. Mood: pt relatively up beat. Team to provide ego support             -cymbalta             -antipsychotic agents: n/a 5. Neuropsych: This patient is capable of making decisions on her own behalf. 6. Skin/Wound Care:    -xeroform, kerlix for chronic burn wounds. Wound clean/granulating             -pt followed at wound clinic remotely             -recent cultures prior to admit + for S Aureus and Pseudomonas              -  will consult [place] RN for further recs for wound care  3/6- WOC suggested topping xeroform with dry gauze and silicone dressings-can use TEDs   3/8- dressings just changed- C/D/I-  7. Fluids/Electrolytes/Nutrition: encourage appropriate po             -added protein supps             -RD f/u             -reviewed importance of nutrition in her recovery 8. Acute on chronic CHF: EF 55-60%. Monitor weights daily wit strict I/O             -pt diuresed 7L on acute             -continue O2             -demadex '20mg'$  daily             -zebeta 2.'5mg'$  daily  3/7- down to 1kg- however Cr up to 2.83- will call Cards due to low BP on max dose of Midodrine and Cr issues  3/8- Wgt down slightly to 80.8 kg from 81.1 kg- Cr down with holding demadex x1 day and 250cc bolus- Cr better at 2.56 today and BUN 66 from 69-  9. Acute on chronic renal failure: Baseline SCr 1.4-->continue to moitor             --likely due to cardiorenal syndrome, recent contrast and Bactrim.  3/8- Cr 2.56- down from 2.83 10. Hx f HTN, now with Orthostatic Hypotension and sinus tach:  -Continue midodrine              -zebeta for rate control             -appropriate volume mtc             -acclimation with therapies             -daily orthostatic vs             -abdominal binder  -?vestibular component, meclizine prn  3/6- has midodrine '15mg'$  TID- max dose- and due to fluid status, cannot use Florinef. Still low BP- cannot add Flomax for lack of voiding due to low BP already.   3/7- went over this with pt- will call Cards.  3/8- Zebeta was held- BP 101/52- sitting- much better than 54T systolic yesterday 11. RLS: requip 12. Urine retention: still retaining  -increase urecholine to '10mg'$  tid             -oob to void  -treat fungal UTI per below  3/6- cannot add flomax due to low BP. 3/7- Urecholine stopped due to low BP- still requiring in/out caths, but went over difficulty with pt since cannot use meds to treat Sx's.    3/8-  still requiring caths 13. Slow transit constipation. No bm since 2/28             -miralax bid, senokot s at hs  -sorbitol today  3/6- LBM overnight- large- con't regimen 14. GI prophylaxis             -protonix bid             -carafate tid 15. Fungal UTI: diflucan '100mg'$  daily thru 3/9 16. Recent right wrist fx, left 5th phalanx fx             -RUE SAC replaced 3/3--f/u with Dr. Grandville Silos as outpt             -left  finger splint in place 17. Class 1 Obesity 18. Pulmonary HTN: see #8  3/6- will con't Albuterol nebs prn- ask nursing to give.      LOS: 4 days A FACE TO FACE EVALUATION WAS PERFORMED  Jakayden Cancio 02/19/2022, 8:08 AM

## 2022-02-19 NOTE — Progress Notes (Signed)
Physical Therapy Session Note ? ?Patient Details  ?Name: [name] ?MRN: [id number] ?Date of Birth: [birth year] ? ?Today's Date: 02/19/2022 ?PT Individual Time: 7482-7078 ?PT Individual Time Calculation (min): 65 min  ? ?Short Term Goals: ?Week 1:  PT Short Term Goal 1 (Week 1): Patient will perform bed mobility with min A consistently. ?PT Short Term Goal 2 (Week 1): Patient will perform basic transfers with min A consistently. ?PT Short Term Goal 3 (Week 1): Patient will ambulate >25 feet using LRAD. ?PT Short Term Goal 4 (Week 1): Patient will tolerate static standing with min A with 1-2 upper extremity support >2 min. ? ?Skilled Therapeutic Interventions/Progress Updates:  ?  pt received in bed and agreeable to therapy. No complaint of pain. Supine>sit with supervision. Sit to stand with min A via VC from elevated bed with PFRW. Pt dressed at EOB with min A for time. Pt sat EOB x ~10 min while therapist adjusted w/c. Pt spoke about her hobbies and wanting to return to crafting with clarity, no notable confusion. ambulatory transfer to recliner with min A and PFRW. Pt then performed part practice of Sit to stand with therapist in front of pt using L hand to push from chair, no AD. Performed standing marches while standing for improvements in orthostatic hypotension. At this time. Pt performed HHA Stand pivot transfer to [facility] for continent BM, nsg notified. Tot assist for hygiene. Pt returned to recliner and was left with all needs in reach and alarm active.  ? ?Therapy Documentation ?Precautions:  ?Precautions ?Precautions: Fall ?Precaution Comments: watch BP, orthostatic hypotension ?Required Braces or Orthoses: Other Brace (R UE cast) ?Other Brace: compression stockings and abdominal binder ?Restrictions ?Weight Bearing Restrictions: Yes ?RUE Weight Bearing: Weight bear through elbow only ?Other Position/Activity Restrictions: R wrist fx and L 5th finger fx, contacting ortho for WB status ? ? ? ?Therapy/Group:  Individual Therapy ? ?[place] ?02/19/2022, 3:35 PM  ?

## 2022-02-19 NOTE — Plan of Care (Signed)
?  Problem: Consults ?Goal: RH GENERAL PATIENT EDUCATION ?Description: See Patient Education module for education specifics. ?Outcome: Progressing ?  ?Problem: RH SKIN INTEGRITY ?Goal: RH STG MAINTAIN SKIN INTEGRITY WITH ASSISTANCE ?Description: STG Maintain Skin Integrity With min Assistance. ?Outcome: Progressing ?Goal: RH STG ABLE TO PERFORM INCISION/WOUND CARE W/ASSISTANCE ?Description: STG Able To Perform Incision/Wound Care With min Assistance. ?Outcome: Progressing ?  ?Problem: RH SAFETY ?Goal: RH STG ADHERE TO SAFETY PRECAUTIONS W/ASSISTANCE/DEVICE ?Description: STG Adhere to Safety Precautions With min Assistance/Device. ?Outcome: Progressing ?  ?Problem: RH KNOWLEDGE DEFICIT GENERAL ?Goal: RH STG INCREASE KNOWLEDGE OF SELF CARE AFTER HOSPITALIZATION ?Description: Patient will demonstrate knowledge of self-care, medication management, skin/wound care, BP management with educational materials and handouts provided by staff independently at discharge. ?Outcome: Progressing ?  ?

## 2022-02-19 NOTE — Progress Notes (Signed)
Occupational Therapy Session Note ? ?Patient Details  ?Name: Manahil Vanzile Sabet ?MRN: [id number] ?Date of Birth: 1946-03-15 ? ?Today's Date: 02/19/2022 ?OT Individual Time: 6060-0459 and [id number] ?OT Individual Time Calculation (min): 25 min and 39 min ? ? ?Short Term Goals: ?Week 1:  OT Short Term Goal 1 (Week 1): Pt will don pants with LRAD with no more than mod A ?OT Short Term Goal 2 (Week 1): Pt will utilize adaptive strategies to don shirt with (S) ?OT Short Term Goal 3 (Week 1): Pt will complete toilet transfer with CGA ? ? ?Skilled Therapeutic Interventions/Progress Updates:  ?  Session 1: Pt greeted at time of session finishing toileting with nursing staff, hand off to OT. Stating pt attempted to urinate but unable to void, pt wanting to attempt using toilet again to prevent cathing. OT trialing water sounds to assist, warm water for sensation, and positional changes but still unable to void. Sit > stand MIN/MOD and stand pivot with PFRW to bed level. Doffed shoes and binder at this time. Oral hygiene while sitting EOB per request with MIN A, instructions on how to incorporate R fingers to assist and primarily using LUE. Sit > supine CGA. Alarm on call bell in reach and nursing aware of dispo ready for I&O cath. ? ?Session 2: Pt greeted at time of session semireclined bed level agreeable to OT session no c/o pain at rest. Pt received phone call at beginning of session and requesting a few minutes to talk with granddaughter. When finished, pt performing bed mob with CGA to sit EOB, donned slip on shoes with Max A. Checked BP sitting TEDS only at 89/63, donned binder and BP rechecked at 91/63, and after sitting in this manner aprox 5 minutes BP at 106/59. Pt with no dizziness at this time and walked short distance in room with PFRW MIN A to recliner. Set up alarm on call bell in reach. Provided pt with medium resistance sponge with instructions to use in B hands, primarily L hand for strengthening and to do basic ROM  in R hand. Note cognition limiting, descreased sequencing and STM deficits during session.  ? ?Therapy Documentation ?Precautions:  ?Precautions ?Precautions: Fall ?Precaution Comments: watch BP, orthostatic hypotension ?Required Braces or Orthoses: Other Brace (R UE cast) ?Other Brace: compression stockings and abdominal binder ?Restrictions ?Weight Bearing Restrictions: Yes ?RUE Weight Bearing: Weight bear through elbow only ?Other Position/Activity Restrictions: R wrist fx and L 5th finger fx, contacting ortho for WB status ? ? ? ? ?Therapy/Group: Individual Therapy ? ?Viona Gilmore ?02/19/2022, 7:26 AM ?

## 2022-02-19 NOTE — Progress Notes (Signed)
Patient had good night last night. Slept well, kept her CPAP.  ?

## 2022-02-20 LAB — VITAMIN B1: Vitamin B1 (Thiamine): 100.2 nmol/L (ref 66.5–200.0)

## 2022-02-20 MED ORDER — POLYETHYLENE GLYCOL 3350 17 G PO PACK
17.0000 g | PACK | Freq: Every day | ORAL | Status: DC
  Administered 2022-02-21: 17 g via ORAL
  Filled 2022-02-20: qty 1

## 2022-02-20 MED ORDER — GABAPENTIN 100 MG PO CAPS
100.0000 mg | ORAL_CAPSULE | Freq: Three times a day (TID) | ORAL | Status: DC
  Administered 2022-02-20 – 2022-03-04 (×37): 100 mg via ORAL
  Filled 2022-02-20 (×38): qty 1

## 2022-02-20 NOTE — Progress Notes (Signed)
Physical Therapy Session Note ? ?Patient Details  ?Name: [name] ?MRN: [id number] ?Date of Birth: August 02, 1946 ? ?Today's Date: 02/20/2022 ?PT Individual Time: 8469-6295 ?PT Individual Time Calculation (min): 45 min  ? ?Short Term Goals: ?Week 1:  PT Short Term Goal 1 (Week 1): Patient will perform bed mobility with min A consistently. ?PT Short Term Goal 2 (Week 1): Patient will perform basic transfers with min A consistently. ?PT Short Term Goal 3 (Week 1): Patient will ambulate >25 feet using LRAD. ?PT Short Term Goal 4 (Week 1): Patient will tolerate static standing with min A with 1-2 upper extremity support >2 min. ? ?Skilled Therapeutic Interventions/Progress Updates: Pt presents sitting in recliner and agreeable to therapy.  Pt w/ TEDS and abd binder already donned.  Pt performed LE there ex including calf raises, LAQ, hip flexion and abd/add 3 x 15.  BP monitored sitting at 110/50, standing 89/62 w/ dizziness c/o 5/10.  Pt stood x 5-6' w/ statements of improving symptoms.  Pt returned to sitting and BP moderated to 128/62 and symptoms resolved approx. 1 min.  Pt amb 10' in room w/ platform walker and min A.  Pt remained sitting in recliner w/ LEs elevated and 2 chair alarms on, all needs in reach. ?   ? ?Therapy Documentation ?Precautions:  ?Precautions ?Precautions: Fall ?Precaution Comments: watch BP, orthostatic hypotension ?Required Braces or Orthoses: Other Brace (R UE cast) ?Other Brace: compression stockings and abdominal binder ?Restrictions ?Weight Bearing Restrictions: Yes ?RUE Weight Bearing: Weight bear through elbow only ?Other Position/Activity Restrictions: R wrist fx and L 5th finger fx, contacting ortho for WB status ?General: ?  ?Vital Signs: ? ?Pain:0/10 ?  ? ? ? ?Therapy/Group: Individual Therapy ? ?[name] ?02/20/2022, 12:27 PM  ?

## 2022-02-20 NOTE — Progress Notes (Signed)
PROGRESS NOTE   Subjective/Complaints:  Wants more H2O- Slept well- better.  LBM this AM per pt- was actually last night- has had 5 in last 2 days per pt.  That is correct per chart.   Michela Pitcher has blisters on hands- but couldn't find any blisters or wounds on hands.    ROS:  Pt denies SOB, abd pain, CP, N/V/C/D, and vision changes      Objective:   No results found. Recent Labs    02/18/22 0532  WBC 5.4  HGB 10.3*  HCT 31.4*  PLT 221   Recent Labs    02/18/22 0532 02/19/22 0629  NA 136 135  K 3.7 3.8  CL 95* 98  CO2 28 26  GLUCOSE 88 90  BUN 69* 66*  CREATININE 2.83* 2.56*  CALCIUM 9.5 9.7    Intake/Output Summary (Last 24 hours) at 02/20/2022 0902 Last data filed at 02/19/2022 1821 Gross per 24 hour  Intake 300 ml  Output 1300 ml  Net -1000 ml        Physical Exam: Vital Signs Blood pressure (!) 98/52, pulse 81, temperature 98.1 F (36.7 C), temperature source Oral, resp. rate 16, height '5\' 7"'$  (1.702 m), weight 79.8 kg, SpO2 97 %.      General: awake, alert, appropriate, sitting up in bed; ate <50% on bed;  NAD HENT: conjugate gaze; oropharynx moist CV: regular rate; no JVD Pulmonary: CTA B/L; no W/R/R- good air movement GI: soft, NT, ND, (+)BS Psychiatric: appropriate Neurological: says has blisters on hands- cannot see any wounds; so not sure about cognition Extremities: No clubbing, cyanosis, tr LE edema. Pulses are 2+ Skin: bilateral LE's/shins each with two 2" diameter wounds which are fairly superficial and with 95% granulation, clean appearing. C/D/I today- just changed this AM Neuro:  Alert and oriented x 3. Normal insight and awareness. Intact Memory. Normal language and speech. Cranial nerve exam unremarkable. RUE limited by [facility], LUE 3-4/5. LE 3 prox to 4- distally. Decreased LT in stocking glove pattern Musculoskeletal: right arm in [place], Left 4th/5th digits splinted     Assessment/Plan: 1. Functional deficits which require 3+ hours per day of interdisciplinary therapy in a comprehensive inpatient rehab setting. Physiatrist is providing close team supervision and 24 hour management of active medical problems listed below. Physiatrist and rehab team continue to assess barriers to discharge/monitor patient progress toward functional and medical goals  Care Tool:  Bathing    Body parts bathed by patient: Right lower leg, Right arm, Left arm, Left lower leg, Chest, Face, Abdomen, Front perineal area, Right upper leg, Left upper leg   Body parts bathed by helper: Buttocks     Bathing assist Assist Level: Moderate Assistance - Patient 50 - 74%     Upper Body Dressing/Undressing Upper body dressing   What is the patient wearing?: Pull over shirt    Upper body assist Assist Level: Moderate Assistance - Patient 50 - 74%    Lower Body Dressing/Undressing Lower body dressing      What is the patient wearing?: Pants     Lower body assist Assist for lower body dressing: Total Assistance - Patient < 25%  Toileting Toileting    Toileting assist Assist for toileting: Total Assistance - Patient < 25%     Transfers Chair/bed transfer  Transfers assist     Chair/bed transfer assist level: Moderate Assistance - Patient 50 - 74%     Locomotion Ambulation   Ambulation assist      Assist level: Minimal Assistance - Patient > 75% Assistive device: Walker-platform Max distance: 5 ft   Walk 10 feet activity   Assist  Walk 10 feet activity did not occur: Safety/medical concerns (decreased activity tolerance)        Walk 50 feet activity   Assist Walk 50 feet with 2 turns activity did not occur: Safety/medical concerns         Walk 150 feet activity   Assist Walk 150 feet activity did not occur: Safety/medical concerns         Walk 10 feet on uneven surface  activity   Assist Walk 10 feet on uneven surfaces  activity did not occur: Safety/medical concerns         Wheelchair     Assist Is the patient using a wheelchair?: Yes Type of Wheelchair: Manual    Wheelchair assist level: Dependent - Patient 0% (limited by upper extremity precautions and decreased activity tolerance)      Wheelchair 50 feet with 2 turns activity    Assist        Assist Level: Dependent - Patient 0%   Wheelchair 150 feet activity     Assist      Assist Level: Dependent - Patient 0%   Blood pressure (!) 98/52, pulse 81, temperature 98.1 F (36.7 C), temperature source Oral, resp. rate 16, height '5\' 7"'$  (1.702 m), weight 79.8 kg, SpO2 97 %.  Medical Problem List and Plan: 1. Functional deficits secondary to debility after congestive heart failure and multiple other med/surg issues             -patient may shower if LUE/cast is covered             -ELOS/Goals: 14-16 days, supervision goals  D/c date3/21  Con't CIR- PT and OT- and added SLP eval for today- if (+), will order brain MRI.   Pt has mild-moderate cognitive impairments- per SLP. Which is new per pt- not clear?  Con't CIR- PT, OT and SLP 2.  Antithrombotics: -DVT/anticoagulation:  sq heparin 5000u q8             -antiplatelet therapy: ASA '81mg'$  daily 3. Pain Management: hydrocodone 7.'5mg'$  qid             -prn tylenol             -increased scheduled gabapentin to '200mg'$  tid for neuropathic pain             -cymbalta  3/7- pain controlled usually- however due to low BP, staff concerned about giving Norco- will d/w team  3/9- will decrease gabapentin to 100 mg TID due to mentation 4. Mood: pt relatively up beat. Team to provide ego support             -cymbalta             -antipsychotic agents: n/a 5. Neuropsych: This patient is capable of making decisions on her own behalf. 6. Skin/Wound Care:    -xeroform, kerlix for chronic burn wounds. Wound clean/granulating             -pt followed at wound clinic remotely              -  recent cultures prior to admit + for S Aureus and Pseudomonas             -will consult WOC RN for further recs for wound care  3/6- WOC suggested topping xeroform with dry gauze and silicone dressings-can use TEDs   3/8- dressings just changed- C/D/I-  7. Fluids/Electrolytes/Nutrition: encourage appropriate po             -added protein supps             -RD f/u             -reviewed importance of nutrition in her recovery 8. Acute on chronic CHF: EF 55-60%. Monitor weights daily wit strict I/O             -pt diuresed 7L on acute             -continue O2             -demadex '20mg'$  daily             -zebeta 2.'5mg'$  daily  3/7- down to 1kg- however Cr up to 2.83- will call Cards due to low BP on max dose of Midodrine and Cr issues  3/8- Wgt down slightly to 80.8 kg from 81.1 kg- Cr down with holding demadex x1 day and 250cc bolus- Cr better at 2.56 today and BUN 66 from 69- 3/9- Demadex restarts today-  wil recheck labs in AM 9. Acute on chronic renal failure: Baseline SCr 1.4-->continue to moitor             --likely due to cardiorenal syndrome, recent contrast and Bactrim.  3/8- Cr 2.56- down from 2.83 10. Hx f HTN, now with Orthostatic Hypotension and sinus tach:  -Continue midodrine              -zebeta for rate control             -appropriate volume mtc             -acclimation with therapies             -daily orthostatic vs             -abdominal binder  -?vestibular component, meclizine prn  3/6- has midodrine '15mg'$  TID- max dose- and due to fluid status, cannot use Florinef. Still low BP- cannot add Flomax for lack of voiding due to low BP already.   3/7- went over this with pt- will call Cards.  3/8- Zebeta was held- BP 101/52- sitting- much better than 27C systolic yesterday 3/9- BP better sitting this AM 11. RLS: requip 12. Urine retention: still retaining  -increase urecholine to '10mg'$  tid             -oob to void  -treat fungal UTI per below  3/6- cannot add flomax due  to low BP. 3/7- Urecholine stopped due to low BP- still requiring in/out caths, but went over difficulty with pt since cannot use meds to treat Sx's.  3/9- no caths last night-  will monitor 13. Slow transit constipation. No bm since 2/28             -miralax bid, senokot s at hs  -sorbitol today  3/6- LBM overnight- large- con't regimen 14. GI prophylaxis             -protonix bid             -carafate tid 15. Fungal UTI: diflucan '100mg'$  daily thru 3/9 16. Recent right  wrist fx, left 5th phalanx fx             -RUE SAC replaced 3/3--f/u with Dr. Grandville Silos as outpt             -left finger splint in place 17. Class 1 Obesity 18. Pulmonary HTN: see #8  3/6- will con't Albuterol nebs prn- ask nursing to give.  19. Blisters on hands?   3/9- pt says has blisters- none seen on hands where she's pointing? Confusion vs old and have healed? 20. Constipation  3/9- will decrease miralax to daily. Had 5 stools in last 48 hours-    I spent a total of 41   minutes on total care today- >50% coordination of care- due to prolonged d/w nursing, pt and PA   LOS: 5 days A FACE TO FACE EVALUATION WAS PERFORMED  Kendyn Zaman 02/20/2022, 9:02 AM

## 2022-02-20 NOTE — Progress Notes (Signed)
Physical Therapy Session Note ? ?Patient Details  ?Name: [name] ?MRN: [id number] ?Date of Birth: 01/26/1946 ? ?Today's Date: 02/20/2022 ?PT Individual Time: 0900-1000 ?PT Individual Time Calculation (min): 60 min  ? ?Short Term Goals: ?Week 1:  PT Short Term Goal 1 (Week 1): Patient will perform bed mobility with min A consistently. ?PT Short Term Goal 2 (Week 1): Patient will perform basic transfers with min A consistently. ?PT Short Term Goal 3 (Week 1): Patient will ambulate >25 feet using LRAD. ?PT Short Term Goal 4 (Week 1): Patient will tolerate static standing with min A with 1-2 upper extremity support >2 min. ? ?Skilled Therapeutic Interventions/Progress Updates:  ?  Pt received in recliner and agreeable to therapy.  No complaint of pain. THTs and abdominal binder already donned. Vitals monitored throughout session as documented below: ?Recliner with legs elevated: 98/51 ?Recliner with legs down after standing and marching in place: 95/50 ?Seated after resistance exercise:88/55 ?After standing marches after extended rest break: 67/43 ? ?Sit to stand with min A-CGA for posterior lean, mod improvement with multimodal cueing. Pt independently recalling RUE placement on RW and pushing with LUE.   ? ?Attempted standing activity but reported lightheadedness/faint feeling. Did not improved with standing marches to improve venous return to the heart, so returned to sitting. Attempted resistance exercise (LAQ with 4# ankle weights BIL) pt symptomatic in sitting with first attempt, but improved symptoms after second and third bout. Noted decrease in BP. Attempted diaphragmatic breathing, but pt reported incr lightheadedness. Extended rest break with pt reporting improvement in symptoms. Final attempt at standing exercise, with onset of symptoms almost immediately and worsening, so returned to sitting. BP dropped to 67/43 so terminated standing activity for today. Provided pt education on staying hydrated, using  positioning to improve upright tolerance over time, and continued POC. Pt remained in recliner at end of session and was left with all needs in reach and alarm active.  ? ?Therapy Documentation ?Precautions:  ?Precautions ?Precautions: Fall ?Precaution Comments: watch BP, orthostatic hypotension ?Required Braces or Orthoses: Other Brace (R UE cast) ?Other Brace: compression stockings and abdominal binder ?Restrictions ?Weight Bearing Restrictions: Yes ?RUE Weight Bearing: Weight bear through elbow only ?Other Position/Activity Restrictions: R wrist fx and L 5th finger fx, contacting ortho for WB status ? ? ? ? ?Therapy/Group: Individual Therapy ? ?[place] ?02/20/2022, 9:24 AM  ?

## 2022-02-20 NOTE — Progress Notes (Signed)
Occupational Therapy Session Note ? ?Patient Details  ?Name: [name] ?MRN: [id number] ?Date of Birth: 22-May-1946 ? ?Today's Date: 02/20/2022 ?OT Individual Time: 5621-3086 and 5784-6962 ?OT Individual Time Calculation (min): 28 min and 58 min ? ? ?Short Term Goals: ?Week 1:  OT Short Term Goal 1 (Week 1): Pt will don pants with LRAD with no more than mod A ?OT Short Term Goal 2 (Week 1): Pt will utilize adaptive strategies to don shirt with (S) ?OT Short Term Goal 3 (Week 1): Pt will complete toilet transfer with CGA ? ? ?Skilled Therapeutic Interventions/Progress Updates:  ?  Pt greeted at time of session semireclined bed level with skin protection pads coming off RLE, spoke with RN who rebandaged wounds and applied dressings at beginning of session prior to donning TEDS. Donned TEDS with total A, supine > sit at EOB with Min/CGA. Donned pants with Max/total with assist to thread and sit > stand to don over hips. Pt ambulating short distance bed > recliner with CGA after initial sit > stand with Min A. Set up alarm on call bell in reach. Note extended time for all tasks 2/2 reduced cognition, extensive explanation, and slow movements. ? ? ?Session 2: Pt greeted at time of session sitting up in recliner, finished eating lunch with notable spillage on self. Note pt still spilling despite attempting upright posture for lunch. No pain reported. Checked BP sitting at 103/44, pt wanting to attempt toileting at this time. 2/2 patient having OH and symptomatic with PT this AM, OT brought BSC to the pt and performed stand pivot with MIN A and PFRW, notable posterior LOB and quickly sitting on commode, pt stating she felt "very dizzy" at this time, BP reading 96/60 and feeling better after approx 5 mins seated. Pt was able to urinate approx 150 ml of urine (estimate). NT present at this time for vitals as well, extended time checking multiple times 2/2 low BP reading. Sit > stand for therapist to assist donning over hips,  stand pivot to wheelchair MIN/MOD A. Set up at sink and OT assisting with hair washing at sink level per pt request, pt towel drying and performing grooming tasks seated, encouraged to use L digits but primarily R hand. Per nursing, needing to bladder scan, stand pivot to bed same as above and sit > supine CGA. Alarm on call bell in reach.  ? ? up in chair, BP, stand pivot BSC, urinate, wheelchair, hair, NT, bed ? ?Therapy Documentation ?Precautions:  ?Precautions ?Precautions: Fall ?Precaution Comments: watch BP, orthostatic hypotension ?Required Braces or Orthoses: Other Brace (R UE cast) ?Other Brace: compression stockings and abdominal binder ?Restrictions ?Weight Bearing Restrictions: Yes ?RUE Weight Bearing: Weight bear through elbow only ?Other Position/Activity Restrictions: R wrist fx and L 5th finger fx, contacting ortho for WB status ? ? ? ? ?Therapy/Group: Individual Therapy ? ?Viona Gilmore ?02/20/2022, 7:09 AM ?

## 2022-02-21 LAB — BASIC METABOLIC PANEL
Anion gap: 14 (ref 5–15)
BUN: 71 mg/dL — ABNORMAL HIGH (ref 8–23)
CO2: 26 mmol/L (ref 22–32)
Calcium: 10.2 mg/dL (ref 8.9–10.3)
Chloride: 92 mmol/L — ABNORMAL LOW (ref 98–111)
Creatinine, Ser: 3.09 mg/dL — ABNORMAL HIGH (ref 0.44–1.00)
GFR, Estimated: 15 mL/min — ABNORMAL LOW (ref >60)
Glucose, Bld: 103 mg/dL — ABNORMAL HIGH (ref 70–99)
Potassium: 3.8 mmol/L (ref 3.5–5.1)
Sodium: 132 mmol/L — ABNORMAL LOW (ref 135–145)

## 2022-02-21 NOTE — Progress Notes (Signed)
Physical Therapy Session Note ? ?Patient Details  ?Name: [name] ?MRN: [id number] ?Date of Birth: 1946-10-24 ? ?Today's Date: 02/21/2022 ?PT Individual Time: 8676-7209 ?PT Individual Time Calculation (min): 54 min  ? ?Short Term Goals: ?Week 1:  PT Short Term Goal 1 (Week 1): Patient will perform bed mobility with min A consistently. ?PT Short Term Goal 2 (Week 1): Patient will perform basic transfers with min A consistently. ?PT Short Term Goal 3 (Week 1): Patient will ambulate >25 feet using LRAD. ?PT Short Term Goal 4 (Week 1): Patient will tolerate static standing with min A with 1-2 upper extremity support >2 min. ? ?Skilled Therapeutic Interventions/Progress Updates:  ?  Pt seated in w/c with legs elevated on arrival and agreeable to therapy. Pt transported to therapy gym for time management and energy conservation. Kinetron 4 x 3 min with 1 min rest breaks at 20 cm/sec for endurance, posterior chain strengthening, and increased BP in response to resistive exercise. After activity pt reported 9/10 light headed ness. ~2 min to return to 0/10 symptoms, but pt reports sleepy feeling. After retrieving dynamap, BP stable at 111/63. Pt performed standing to table x 2 with onset of symptoms. During second stand, BP taken at 69/30, but with less intense symptoms than previous per pt report. Pt requested to return to room for toileting. Stand pivot transfer with PFRW to [facility], and pt handed off to NT to complete toileting at end of session.  ? ?Therapy Documentation ?Precautions:  ?Precautions ?Precautions: Fall ?Precaution Comments: watch BP, orthostatic hypotension ?Required Braces or Orthoses: Other Brace (R UE cast) ?Other Brace: compression stockings and abdominal binder ?Restrictions ?Weight Bearing Restrictions: Yes ?RUE Weight Bearing: Weight bear through elbow only ?Other Position/Activity Restrictions: R wrist fx and L 5th finger fx, contacting ortho for WB status ? ? ? ?Therapy/Group: Individual  Therapy ? ?[place] ?02/21/2022, 11:14 AM  ?

## 2022-02-21 NOTE — Progress Notes (Signed)
Occupational Therapy Session Note ? ?Patient Details  ?Name: [name] ?MRN: [id number] ?Date of Birth: [birth year] ? ?Today's Date: 02/21/2022 ?OT Individual Time: [phone] ?OT Individual Time Calculation (min): 55 min  ? ? ?Short Term Goals: ?Week 1:  OT Short Term Goal 1 (Week 1): Pt will don pants with LRAD with no more than mod A ?OT Short Term Goal 2 (Week 1): Pt will utilize adaptive strategies to don shirt with (S) ?OT Short Term Goal 3 (Week 1): Pt will complete toilet transfer with CGA ? ?Skilled Therapeutic Interventions/Progress Updates:  ?Patient met seated in recliner in agreement with OT treatment session. 5/10 pain "all over". Reports receiving a.m. pain meds prior to start of session. Per NT patient completed transfer to [facility] for BM prior to entry. Sit to stand from recliner to [place] with Min A and cues for hand placement and anterior weight shift and stand-pivot to wc with Min A (slightly elevated surface. Mod A for controlled descent to wc. Total A for wc mobility to sink level in prep for bathing. UB bathing/dressing with Min A and LB bathing/dressing with Max A. Max A to don abdominal binder. Mod A for several sit to stands at sink surface from low wc during LB ADLs. Max A to don footwear seated in wc. Patient states "everything just tires me out!" Education on energy conservation and compensatory strategies throughout session. Patient would benefit from continued education. Oral hygiene seated at sink level with Min A. Min cues overall for sequencing/safety. Session concluded with patient seated in wc with call bell within reach, belt alarm activated and all needs met.  ? ?Therapy Documentation ?Precautions:  ?Precautions ?Precautions: Fall ?Precaution Comments: watch BP, orthostatic hypotension ?Required Braces or Orthoses: Other Brace (R UE cast) ?Other Brace: compression stockings and abdominal binder ?Restrictions ?Weight Bearing Restrictions: Yes ?RUE Weight Bearing: Weight bear through elbow  only ?Other Position/Activity Restrictions: R wrist fx and L 5th finger fx, contacting ortho for WB status ?General: ?  ? ?Therapy/Group: Individual Therapy ? ?Madyn Ivins R [place] ?02/21/2022, 6:41 AM ?

## 2022-02-21 NOTE — Progress Notes (Signed)
PROGRESS NOTE   Subjective/Complaints:  Pt admits to drinking ~ 5 cups/day of water- Really dizzy overnight, even when laying down. And esp when sat up/stood up- almost fell backwards.   Continues to ask if there's anything that we've discovered that might be causing her Sx's- I reiterated that we know why- but are stuck due to low BP and only so many treatments.    ROS:  Pt denies SOB, abd pain, CP, N/V/C/D, and vision changes      Objective:   No results found. No results for input(s): WBC, HGB, HCT, PLT in the last 72 hours.  Recent Labs    02/19/22 0629  NA 135  K 3.8  CL 98  CO2 26  GLUCOSE 90  BUN 66*  CREATININE 2.56*  CALCIUM 9.7    Intake/Output Summary (Last 24 hours) at 02/21/2022 0809 Last data filed at 02/20/2022 2130 Gross per 24 hour  Intake --  Output 1175 ml  Net -1175 ml        Physical Exam: Vital Signs Blood pressure (!) 104/49, pulse 91, temperature 98.2 F (36.8 C), temperature source Oral, resp. rate 20, height '5\' 7"'$  (1.702 m), weight 81.7 kg, SpO2 94 %.       General: awake, alert, appropriate, sitting EOB- appears comfortable- denies dizziness right now; NAD HENT: conjugate gaze; oropharynx moist CV: regular rate in 90s; no JVD Pulmonary: CTA B/L; no W/R/R- good air movement GI: soft, NT, ND, (+)BS Psychiatric: appropriate Neurological: alert- repetitive Extremities: No clubbing, cyanosis, tr LE edema. Pulses are 2+ Skin: bilateral LE's/shins each with two 2" diameter wounds which are fairly superficial and with 95% granulation, clean appearing. C/D/I Neuro:  Alert and oriented x 3. Normal insight and awareness. Intact Memory. Normal language and speech. Cranial nerve exam unremarkable. RUE limited by [facility], LUE 3-4/5. LE 3 prox to 4- distally. Decreased LT in stocking glove pattern Musculoskeletal: right arm in [place], Left 4th/5th digits splinted    Assessment/Plan: 1.  Functional deficits which require 3+ hours per day of interdisciplinary therapy in a comprehensive inpatient rehab setting. Physiatrist is providing close team supervision and 24 hour management of active medical problems listed below. Physiatrist and rehab team continue to assess barriers to discharge/monitor patient progress toward functional and medical goals  Care Tool:  Bathing    Body parts bathed by patient: Right lower leg, Right arm, Left arm, Left lower leg, Chest, Face, Abdomen, Front perineal area, Right upper leg, Left upper leg   Body parts bathed by helper: Buttocks     Bathing assist Assist Level: Moderate Assistance - Patient 50 - 74%     Upper Body Dressing/Undressing Upper body dressing   What is the patient wearing?: Pull over shirt    Upper body assist Assist Level: Moderate Assistance - Patient 50 - 74%    Lower Body Dressing/Undressing Lower body dressing      What is the patient wearing?: Pants     Lower body assist Assist for lower body dressing: Total Assistance - Patient < 25%     Toileting Toileting    Toileting assist Assist for toileting: Total Assistance - Patient < 25%  Transfers Chair/bed transfer  Transfers assist     Chair/bed transfer assist level: Moderate Assistance - Patient 50 - 74%     Locomotion Ambulation   Ambulation assist      Assist level: Minimal Assistance - Patient > 75% Assistive device: Walker-platform Max distance: 10   Walk 10 feet activity   Assist  Walk 10 feet activity did not occur: Safety/medical concerns (decreased activity tolerance)  Assist level: Minimal Assistance - Patient > 75% Assistive device: Walker-rolling   Walk 50 feet activity   Assist Walk 50 feet with 2 turns activity did not occur: Safety/medical concerns         Walk 150 feet activity   Assist Walk 150 feet activity did not occur: Safety/medical concerns         Walk 10 feet on uneven surface   activity   Assist Walk 10 feet on uneven surfaces activity did not occur: Safety/medical concerns         Wheelchair     Assist Is the patient using a wheelchair?: Yes Type of Wheelchair: Manual    Wheelchair assist level: Dependent - Patient 0% (limited by upper extremity precautions and decreased activity tolerance)      Wheelchair 50 feet with 2 turns activity    Assist        Assist Level: Dependent - Patient 0%   Wheelchair 150 feet activity     Assist      Assist Level: Dependent - Patient 0%   Blood pressure (!) 104/49, pulse 91, temperature 98.2 F (36.8 C), temperature source Oral, resp. rate 20, height '5\' 7"'$  (1.702 m), weight 81.7 kg, SpO2 94 %.  Medical Problem List and Plan: 1. Functional deficits secondary to debility after congestive heart failure and multiple other med/surg issues             -patient may shower if LUE/cast is covered             -ELOS/Goals: 14-16 days, supervision goals  D/c date3/21  Con't CIR- PT and OT- and added SLP eval for today- if (+), will order brain MRI.   Pt has mild-moderate cognitive impairments- per SLP. Which is new per pt- not clear?  Con't CIR- PT, OT and SLP 2.  Antithrombotics: -DVT/anticoagulation:  sq heparin 5000u q8             -antiplatelet therapy: ASA '81mg'$  daily 3. Pain Management: hydrocodone 7.'5mg'$  qid             -prn tylenol             -increased scheduled gabapentin to '200mg'$  tid for neuropathic pain             -cymbalta  3/7- pain controlled usually- however due to low BP, staff concerned about giving Norco- will d/w team  3/9- will decrease gabapentin to 100 mg TID due to mentation  3/10- no change in pain, however slightly more alert this AM- pt insistent cannot reduce Norco dosing.  4. Mood: pt relatively up beat. Team to provide ego support             -cymbalta             -antipsychotic agents: n/a 5. Neuropsych: This patient is capable of making decisions on her own  behalf. 6. Skin/Wound Care:    -xeroform, kerlix for chronic burn wounds. Wound clean/granulating             -pt followed at wound clinic  remotely             -recent cultures prior to admit + for S Aureus and Pseudomonas             -will consult WOC RN for further recs for wound care  3/6- WOC suggested topping xeroform with dry gauze and silicone dressings-can use TEDs   3/8- dressings just changed- C/D/I-  7. Fluids/Electrolytes/Nutrition: encourage appropriate po             -added protein supps             -RD f/u             -reviewed importance of nutrition in her recovery 8. Acute on chronic CHF: EF 55-60%. Monitor weights daily wit strict I/O             -pt diuresed 7L on acute             -continue O2             -demadex '20mg'$  daily             -zebeta 2.'5mg'$  daily  3/7- down to 1kg- however Cr up to 2.83- will call Cards due to low BP on max dose of Midodrine and Cr issues  3/8- Wgt down slightly to 80.8 kg from 81.1 kg- Cr down with holding demadex x1 day and 250cc bolus- Cr better at 2.56 today and BUN 66 from 69- 3/9- Demadex restarts today-  wil recheck labs in AM  3/10- will recheck BMP this AM- and also ordered qmonday- drink 6-8 cups of water/day- asked her to 9. Acute on chronic renal failure: Baseline SCr 1.4-->continue to moitor             --likely due to cardiorenal syndrome, recent contrast and Bactrim.  3/8- Cr 2.56- down from 2.83  3/10- will recheck this AM 10. Hx f HTN, now with Orthostatic Hypotension and sinus tach:  -Continue midodrine              -zebeta for rate control             -appropriate volume mtc             -acclimation with therapies             -daily orthostatic vs             -abdominal binder  -?vestibular component, meclizine prn  3/6- has midodrine '15mg'$  TID- max dose- and due to fluid status, cannot use Florinef. Still low BP- cannot add Flomax for lack of voiding due to low BP already.   3/7- went over this with pt- will call  Cards.  3/8- Zebeta was held- BP 101/52- sitting- much better than 45W systolic yesterday 3/9- BP better sitting this AM  3/10- had more dizziness las tnight- but appears comfortable sitting EOB this AM.  11. RLS: requip 12. Urine retention: still retaining  -increase urecholine to '10mg'$  tid             -oob to void  -treat fungal UTI per below  3/6- cannot add flomax due to low BP. 3/7- Urecholine stopped due to low BP- still requiring in/out caths, but went over difficulty with pt since cannot use meds to treat Sx's.  3/10- was cathed again last night x2- but wasn't cathed night of 3/8 to 3/9 13. Slow transit constipation. No bm since 2/28             -  miralax bid, senokot s at hs  -sorbitol today  3/6- LBM overnight- large- con't regimen  3/10- LBM 3/8 at night- reduced miralax to qday per pt request- might need to increase again.  14. GI prophylaxis             -protonix bid             -carafate tid 15. Fungal UTI: diflucan '100mg'$  daily thru 3/9 16. Recent right wrist fx, left 5th phalanx fx             -RUE SAC replaced 3/3--f/u with Dr. Grandville Silos as outpt             -left finger splint in place 17. Class 1 Obesity 18. Pulmonary HTN: see #8  3/6- will con't Albuterol nebs prn- ask nursing to give.  19. Blisters on hands?   3/9- pt says has blisters- none seen on hands where she's pointing? Confusion vs old and have healed?   I spent a total of 38   minutes on total care today- >50% coordination of care- due to labs- pending and education on water and intake- also her BP issues.    LOS: 6 days A FACE TO FACE EVALUATION WAS PERFORMED  Matasha Smigelski 02/21/2022, 8:09 AM

## 2022-02-21 NOTE — Progress Notes (Signed)
Physical Therapy Session Note ? ?Patient Details  ?Name: [name] ?MRN: [id number] ?Date of Birth: [birth year] ? ?Today's Date: 02/21/2022 ?PT Individual Time: 4696-2952 ?PT Individual Time Calculation (min): 19 min  ? ?Short Term Goals: ?Week 1:  PT Short Term Goal 1 (Week 1): Patient will perform bed mobility with min A consistently. ?PT Short Term Goal 2 (Week 1): Patient will perform basic transfers with min A consistently. ?PT Short Term Goal 3 (Week 1): Patient will ambulate >25 feet using LRAD. ?PT Short Term Goal 4 (Week 1): Patient will tolerate static standing with min A with 1-2 upper extremity support >2 min. ? ?Skilled Therapeutic Interventions/Progress Updates:  ?  pt received in bed and agreeable to therapy with nsg present to administer medication. No complaint of pain. Therapist late to arrive d/t previous pt care, pt missed 11 min of scheduled PT, will make up as able. Session focused on reassessing/treating vestibular symptoms. Performed dix hallpike and epley maneuver, pt reported no dizziness throughout but did note down beating saccades. Tested saccades and VOR in sitting with no onset of symptoms. Discussed ramifications of testing and provided education on vestibular vs orthostatic dizziness. Pt returned to bed after session and was left with all needs in reach and alarm active.  ? ?Therapy Documentation ?Precautions:  ?Precautions ?Precautions: Fall ?Precaution Comments: watch BP, orthostatic hypotension ?Required Braces or Orthoses: Other Brace (R UE cast) ?Other Brace: compression stockings and abdominal binder ?Restrictions ?Weight Bearing Restrictions: Yes ?RUE Weight Bearing: Weight bear through elbow only ?Other Position/Activity Restrictions: R wrist fx and L 5th finger fx, contacting ortho for WB status ?General: ?PT Amount of Missed Time (min): 11 Minutes ?PT Missed Treatment Reason: Other (Comment) (previous pt care) ? ? ? ?Therapy/Group: Individual Therapy ? ?[place] ?02/21/2022, 4:01 PM  ?

## 2022-02-21 NOTE — Progress Notes (Signed)
Speech Language Pathology Daily Session Note ? ?Patient Details  ?Name: [name] ?MRN: [id number] ?Date of Birth: 04-22-1946 ? ?Today's Date: 02/21/2022 ?SLP Individual Time: 1700-1749 ?SLP Individual Time Calculation (min): 59 min ? ?Short Term Goals: ?Week 1: SLP Short Term Goal 1 (Week 1): Pt will demonstrate sustained attention in functional tasks in 3-45 minute intervals with min A verbal cues for redirection. ?SLP Short Term Goal 2 (Week 1): Pt will demonstrate daily and novel recall with supervision A verbal cues for internal/external aids. ?SLP Short Term Goal 3 (Week 1): Pt will demonstrate mildly complex problem solving skills with supervision A verbal cues. ?SLP Short Term Goal 4 (Week 1): Pt will demonstrate self-monitoring and self-correction of errors in functional tasks with min A verbal cues. ?SLP Short Term Goal 5 (Week 1): Pt will demonstrate orientation to time with supervision A verbal cues for internal/external aids. ? ?Skilled Therapeutic Interventions: ?Pt seen for skilled ST with focus on cognitive goals, upright in wheelchair and agreeable to all therapeutic tasks. Pt was oriented to DOW, MOY, date, year, place and was able to self-correct error in time of day (thought 2 pm but then stated "but that can't be right because you're scheduled around 10"). Pt able to detail recent medical events but demonstrates decreased awareness of how current physical and cognitive impairments can impact daily living tasks. Discussed fall prevention strategies, especially focused on pt reports of significant dizziness at this time. SLP facilitated mildly complex problem solving/reasoning task by providing overall min A cues for 100% accuracy. Pt enjoyed the task and is motivated to continue cognitively stimulating activities. Pt left in wheelchair with alarm set and all needs within reach, cont ST POC.  ? ?Pain ?Pain Assessment ?Pain Scale: 0-10 ?Pain Score: 0-No pain ? ?Therapy/Group: Individual  Therapy ? ?Dewaine Conger ?02/21/2022, 10:28 AM ?

## 2022-02-23 LAB — URINALYSIS, ROUTINE W REFLEX MICROSCOPIC
Bilirubin Urine: NEGATIVE
Glucose, UA: NEGATIVE mg/dL
Ketones, ur: NEGATIVE mg/dL
Nitrite: NEGATIVE
Protein, ur: NEGATIVE mg/dL
Specific Gravity, Urine: 1.011 (ref 1.005–1.030)
pH: 5 (ref 5.0–8.0)

## 2022-02-23 MED ORDER — POLYETHYLENE GLYCOL 3350 17 G PO PACK: 17.0000 g | PACK | Freq: Every day | ORAL | Status: DC | PRN

## 2022-02-23 MED ORDER — CEPHALEXIN 250 MG PO CAPS
500.0000 mg | ORAL_CAPSULE | Freq: Two times a day (BID) | ORAL | Status: AC
  Administered 2022-02-23 – 2022-02-28 (×10): 500 mg via ORAL
  Filled 2022-02-23 (×10): qty 2

## 2022-02-23 NOTE — Progress Notes (Signed)
PROGRESS NOTE   Subjective/Complaints:  Pt admits still having nausea and lightheadedness- when stands up, but admits not using abd binder when goes around room- explained she needs it any time she stands.  Having too many BM's- last overnight- Also having dysuria and urine cloudy and odorous per nursing.  That could be making her BP worse- with a UTI.  Also c/o shakiness and jittery feeling/movement of arms/legs.    ROS:  Pt denies SOB, abd pain, CP, N/V/C/D, and vision changes UTI Sx's.  as above.       Objective:   No results found. No results for input(s): WBC, HGB, HCT, PLT in the last 72 hours.  Recent Labs    02/21/22 0947  NA 132*  K 3.8  CL 92*  CO2 26  GLUCOSE 103*  BUN 71*  CREATININE 3.09*  CALCIUM 10.2    Intake/Output Summary (Last 24 hours) at 02/23/2022 1418 Last data filed at 02/23/2022 1338 Gross per 24 hour  Intake 440 ml  Output 400 ml  Net 40 ml        Physical Exam: Vital Signs Blood pressure (!) 107/50, pulse 96, temperature (!) 97.2 F (36.2 C), resp. rate 17, height '5\' 7"'$  (1.702 m), weight 81.5 kg, SpO2 100 %.        General: awake, alert, appropriate, Sitting up Eob eating breakfast- able to use fork and pick up drinks appropriately- tremor didn't impede when I was watching,  NAD HENT: conjugate gaze; oropharynx moist CV: regular/borderline tachycardic rate; no JVD Pulmonary: CTA B/L; no W/R/R- good air movement GI: soft, NT, ND, (+)BS- slightly hyperactive Psychiatric: appropriate Neurological: Ox3 More shakiness/in Arms- appears like myoclonus- coming and going as well.  Extremities: no LE edema seen this AM-  Skin: bilateral LE's/shins each with two 2" diameter wounds which are fairly superficial and with 95% granulation, clean appearing. Dressings C/D/I Neuro:  Alert and oriented x 3. Normal insight and awareness. Intact Memory. Normal language and speech. Cranial  nerve exam unremarkable. RUE limited by [facility], LUE 3-4/5. LE 3 prox to 4- distally. Decreased LT in stocking glove pattern Musculoskeletal: right arm in [place], Left 4th/5th digits splinted    Assessment/Plan: 1. Functional deficits which require 3+ hours per day of interdisciplinary therapy in a comprehensive inpatient rehab setting. Physiatrist is providing close team supervision and 24 hour management of active medical problems listed below. Physiatrist and rehab team continue to assess barriers to discharge/monitor patient progress toward functional and medical goals  Care Tool:  Bathing    Body parts bathed by patient: Right lower leg, Right arm, Left arm, Left lower leg, Chest, Face, Abdomen, Front perineal area, Right upper leg, Left upper leg   Body parts bathed by helper: Buttocks     Bathing assist Assist Level: Moderate Assistance - Patient 50 - 74%     Upper Body Dressing/Undressing Upper body dressing   What is the patient wearing?: Pull over shirt    Upper body assist Assist Level: Moderate Assistance - Patient 50 - 74%    Lower Body Dressing/Undressing Lower body dressing      What is the patient wearing?: Pants     Lower  body assist Assist for lower body dressing: Total Assistance - Patient < 25%     Toileting Toileting    Toileting assist Assist for toileting: Total Assistance - Patient < 25%     Transfers Chair/bed transfer  Transfers assist     Chair/bed transfer assist level: Moderate Assistance - Patient 50 - 74%     Locomotion Ambulation   Ambulation assist      Assist level: Minimal Assistance - Patient > 75% Assistive device: Walker-platform Max distance: 10   Walk 10 feet activity   Assist  Walk 10 feet activity did not occur: Safety/medical concerns (decreased activity tolerance)  Assist level: Minimal Assistance - Patient > 75% Assistive device: Walker-rolling   Walk 50 feet activity   Assist Walk 50 feet with 2 turns  activity did not occur: Safety/medical concerns         Walk 150 feet activity   Assist Walk 150 feet activity did not occur: Safety/medical concerns         Walk 10 feet on uneven surface  activity   Assist Walk 10 feet on uneven surfaces activity did not occur: Safety/medical concerns         Wheelchair     Assist Is the patient using a wheelchair?: Yes Type of Wheelchair: Manual    Wheelchair assist level: Dependent - Patient 0% (limited by upper extremity precautions and decreased activity tolerance)      Wheelchair 50 feet with 2 turns activity    Assist        Assist Level: Dependent - Patient 0%   Wheelchair 150 feet activity     Assist      Assist Level: Dependent - Patient 0%   Blood pressure (!) 107/50, pulse 96, temperature (!) 97.2 F (36.2 C), resp. rate 17, height '5\' 7"'$  (1.702 m), weight 81.5 kg, SpO2 100 %.  Medical Problem List and Plan: 1. Functional deficits secondary to debility after congestive heart failure and multiple other med/surg issues             -patient may shower if LUE/cast is covered             -ELOS/Goals: 14-16 days, supervision goals  D/c date3/21  Con't CIR- PT and OT- and added SLP eval for today- if (+), will order brain MRI.   Pt has mild-moderate cognitive impairments- per SLP. Which is new per pt- not clear?  Continue CIR- PT, OT and SLP-  2.  Antithrombotics: -DVT/anticoagulation:  sq heparin 5000u q8             -antiplatelet therapy: ASA '81mg'$  daily 3. Pain Management: hydrocodone 7.'5mg'$  qid             -prn tylenol             -increased scheduled gabapentin to '200mg'$  tid for neuropathic pain             -cymbalta  3/7- pain controlled usually- however due to low BP, staff concerned about giving Norco- will d/w team  3/9- will decrease gabapentin to 100 mg TID due to mentation  3/10- no change in pain, however slightly more alert this AM- pt insistent cannot reduce Norco dosing.  4. Mood: pt  relatively up beat. Team to provide ego support             -cymbalta             -antipsychotic agents: n/a 5. Neuropsych: This patient is capable of making  decisions on her own behalf. 6. Skin/Wound Care:    -xeroform, kerlix for chronic burn wounds. Wound clean/granulating             -pt followed at wound clinic remotely             -recent cultures prior to admit + for S Aureus and Pseudomonas             -will consult WOC RN for further recs for wound care  3/6- WOC suggested topping xeroform with dry gauze and silicone dressings-can use TEDs   3/8- dressings just changed- C/D/I-  7. Fluids/Electrolytes/Nutrition: encourage appropriate po             -added protein supps             -RD f/u             -reviewed importance of nutrition in her recovery 8. Acute on chronic CHF: EF 55-60%. Monitor weights daily wit strict I/O             -pt diuresed 7L on acute             -continue O2             -demadex '20mg'$  daily             -zebeta 2.'5mg'$  daily  3/7- down to 1kg- however Cr up to 2.83- will call Cards due to low BP on max dose of Midodrine and Cr issues  3/8- Wgt down slightly to 80.8 kg from 81.1 kg- Cr down with holding demadex x1 day and 250cc bolus- Cr better at 2.56 today and BUN 66 from 69- 3/9- Demadex restarts today-  wil recheck labs in AM  3/10- will recheck BMP this AM- and also ordered qmonday- drink 6-8 cups of water/day- asked her to 3.12- weight stable in last 3 days- however Cr up to 3.09 on Friday after she was seen- will recheck in AM and call Cards as required.  9. Acute on chronic renal failure: Baseline SCr 1.4-->continue to moitor             --likely due to cardiorenal syndrome, recent contrast and Bactrim.  3/8- Cr 2.56- down from 2.83  3/10- will recheck this AM  3/12- Cr up to 3.09- will recheck in AM- since was Friday- has cardiorenal syndrome- so hopeful will improve, but also on Demadex-  10. Hx f HTN, now with Orthostatic Hypotension and sinus tach:   -Continue midodrine              -zebeta for rate control             -appropriate volume mtc             -acclimation with therapies             -daily orthostatic vs             -abdominal binder  -?vestibular component, meclizine prn  3/6- has midodrine '15mg'$  TID- max dose- and due to fluid status, cannot use Florinef. Still low BP- cannot add Flomax for lack of voiding due to low BP already.   3/7- went over this with pt- will call Cards.  3/8- Zebeta was held- BP 101/52- sitting- much better than 51O systolic yesterday 3/9- BP better sitting this AM  3/10- had more dizziness las tnight- but appears comfortable sitting EOB this AM.   3/12- standing without abd binder- explained needs to use except when in  shower- when up- also many patients become acclimated- hopeful this will occur.  11. RLS: requip 12. Urine retention: still retaining  -increase urecholine to '10mg'$  tid             -oob to void  -treat fungal UTI per below  3/6- cannot add flomax due to low BP. 3/7- Urecholine stopped due to low BP- still requiring in/out caths, but went over difficulty with pt since cannot use meds to treat Sx's.  3/10- was cathed again last night x2- but wasn't cathed night of 3/8 to 3/9 13. Slow transit constipation. No bm since 2/28             -miralax bid, senokot s at hs  -sorbitol today  3/6- LBM overnight- large- con't regimen  3/10- LBM 3/8 at night- reduced miralax to qday per pt request- might need to increase again. 3/12- made Miralax prn- since having so many BM's.   14. GI prophylaxis             -protonix bid             -carafate tid 15. Fungal /then bacteriaUTI: diflucan '100mg'$  daily thru 3/9  3/12- new UTI, it appears base don U/A and having more dysuria, and cloudy malodorous urine- U/A (+) moderate Leuks, no nitrites- will start Keflex 500 mg BID due to Cr issues x 5 days 16. Recent right wrist fx, left 5th phalanx fx             -RUE SAC replaced 3/3--f/u with Dr. Grandville Silos  as outpt             -left finger splint in place 17. Class 1 Obesity 18. Pulmonary HTN: see #8  3/6- will con't Albuterol nebs prn- ask nursing to give.  19. Blisters on hands?   3/9- pt says has blisters- none seen on hands where she's pointing? Confusion vs old and have healed?  I spent a total of    minutes on total care today- >50% coordination of care- due to   I spent a total of  39  minutes on total care today- >50% coordination of care- due to d/w nursing and follow up on UTI- labs in AM   LOS: 8 days A FACE TO FACE EVALUATION WAS PERFORMED  Sharyl Panchal 02/23/2022, 2:18 PM

## 2022-02-23 NOTE — Progress Notes (Signed)
Speech Language Pathology Daily Session Note ? ?Patient Details  ?Name: [name] ?MRN: [id number] ?Date of Birth: July 21, 1946 ? ?Today's Date: 02/23/2022 ?SLP Individual Time: 4098-1191 ?SLP Individual Time Calculation (min): 25 min ? ?Short Term Goals: ?Week 1: SLP Short Term Goal 1 (Week 1): Pt will demonstrate sustained attention in functional tasks in 3-45 minute intervals with min A verbal cues for redirection. ?SLP Short Term Goal 2 (Week 1): Pt will demonstrate daily and novel recall with supervision A verbal cues for internal/external aids. ?SLP Short Term Goal 3 (Week 1): Pt will demonstrate mildly complex problem solving skills with supervision A verbal cues. ?SLP Short Term Goal 4 (Week 1): Pt will demonstrate self-monitoring and self-correction of errors in functional tasks with min A verbal cues. ?SLP Short Term Goal 5 (Week 1): Pt will demonstrate orientation to time with supervision A verbal cues for internal/external aids. ? ?Skilled Therapeutic Interventions: ? Pt was seen for skilled ST targeting cognitive goals.  Pt was awake, alert, and agreeable to participating in treatment.  Pt states that she feels she is back to baseline for her cognition and was oriented to day of the week and month.  She was only off by 2 days for today's exact date.  SLP facilitated the session with a novel card game targeting problem solving skills.  Pt needed max faded to min assist verbal cues for working memory of task rules and procedures to plan and execute a problem solve strategy within task.  Pt was left in bed with bed alarm set and call bell within reach.  Continue per current plan of care.   ? ?Pain ?Pain Assessment ?Pain Scale: 0-10 ?Pain Score: 0-No pain ? ?Therapy/Group: Individual Therapy ? ?Sankalp Ferrell, Selinda Orion ?02/23/2022, 12:45 PM ?

## 2022-02-24 ENCOUNTER — Other Ambulatory Visit: Payer: Self-pay | Admitting: Neurology

## 2022-02-24 DIAGNOSIS — I6529 Occlusion and stenosis of unspecified carotid artery: Secondary | ICD-10-CM | POA: Diagnosis not present

## 2022-02-24 DIAGNOSIS — Z9181 History of falling: Secondary | ICD-10-CM | POA: Diagnosis not present

## 2022-02-24 DIAGNOSIS — H53419 Scotoma involving central area, unspecified eye: Secondary | ICD-10-CM | POA: Diagnosis not present

## 2022-02-24 DIAGNOSIS — M542 Cervicalgia: Secondary | ICD-10-CM | POA: Diagnosis not present

## 2022-02-24 LAB — CBC WITH DIFFERENTIAL/PLATELET
Abs Immature Granulocytes: 0.02 K/uL (ref 0.00–0.07)
Basophils Absolute: 0.1 K/uL (ref 0.0–0.1)
Basophils Relative: 1 %
Eosinophils Absolute: 0.3 K/uL (ref 0.0–0.5)
Eosinophils Relative: 5 %
HCT: 32.8 % — ABNORMAL LOW (ref 36.0–46.0)
Hemoglobin: 10.9 g/dL — ABNORMAL LOW (ref 12.0–15.0)
Immature Granulocytes: 0 %
Lymphocytes Relative: 23 %
Lymphs Abs: 1.3 K/uL (ref 0.7–4.0)
MCH: 32.9 pg (ref 26.0–34.0)
MCHC: 33.2 g/dL (ref 30.0–36.0)
MCV: 99.1 fL (ref 80.0–100.0)
Monocytes Absolute: 0.5 K/uL (ref 0.1–1.0)
Monocytes Relative: 9 %
Neutro Abs: 3.4 K/uL (ref 1.7–7.7)
Neutrophils Relative %: 62 %
Platelets: 286 K/uL (ref 150–400)
RBC: 3.31 MIL/uL — ABNORMAL LOW (ref 3.87–5.11)
RDW: 15.2 % (ref 11.5–15.5)
WBC: 5.5 K/uL (ref 4.0–10.5)
nRBC: 0 % (ref 0.0–0.2)

## 2022-02-24 LAB — COMPREHENSIVE METABOLIC PANEL
ALT: 16 U/L (ref 0–44)
AST: 21 U/L (ref 15–41)
Albumin: 2.7 g/dL — ABNORMAL LOW (ref 3.5–5.0)
Alkaline Phosphatase: 91 U/L (ref 38–126)
Anion gap: 13 (ref 5–15)
BUN: 81 mg/dL — ABNORMAL HIGH (ref 8–23)
CO2: 27 mmol/L (ref 22–32)
Calcium: 9.8 mg/dL (ref 8.9–10.3)
Chloride: 95 mmol/L — ABNORMAL LOW (ref 98–111)
Creatinine, Ser: 2.97 mg/dL — ABNORMAL HIGH (ref 0.44–1.00)
GFR, Estimated: 16 mL/min — ABNORMAL LOW (ref >60)
Glucose, Bld: 114 mg/dL — ABNORMAL HIGH (ref 70–99)
Potassium: 3.7 mmol/L (ref 3.5–5.1)
Sodium: 135 mmol/L (ref 135–145)
Total Bilirubin: 0.6 mg/dL (ref 0.3–1.2)
Total Protein: 5.9 g/dL — ABNORMAL LOW (ref 6.5–8.1)

## 2022-02-24 MED ORDER — TORSEMIDE 20 MG PO TABS: 20.0000 mg | ORAL_TABLET | Freq: Every day | ORAL | Status: DC

## 2022-02-24 MED ORDER — TORSEMIDE 20 MG PO TABS
20.0000 mg | ORAL_TABLET | Freq: Every day | ORAL | Status: DC
  Administered 2022-02-27 – 2022-03-04 (×6): 20 mg via ORAL
  Filled 2022-02-24 (×6): qty 1

## 2022-02-24 NOTE — Progress Notes (Signed)
Occupational Therapy Session Note ? ?Patient Details  ?Name: [name] ?MRN: [id number] ?Date of Birth: Apr 04, 1946 ? ?Today's Date: 02/24/2022 ?OT Individual Time: 1002-1100 ?OT Individual Time Calculation (min): 58 min  ? ? ?Short Term Goals: ?Week 1:  OT Short Term Goal 1 (Week 1): Pt will don pants with LRAD with no more than mod A ?OT Short Term Goal 2 (Week 1): Pt will utilize adaptive strategies to don shirt with (S) ?OT Short Term Goal 3 (Week 1): Pt will complete toilet transfer with CGA ? ? ?Skilled Therapeutic Interventions/Progress Updates:  ?Patient met lying supine in bed in agreement with OT treatment session. 0/10 pain reported at rest and with activity. Supine to EOB with HOB elevated and CGA. Abdominal binder donned seated EOB for BP control. Patient indicating need to void bladder. Min A for sit to stand from EOB to [place]. Functional mobility to commode in bathroom and toilet transfer with Min A. Patient expressed desire to complete bathing at shower level. BP assessed. With knee high TED hose and abdominal binder BP BP 107/70. With abdominal binder off in sitting BP dropped to 104/63. With sit to stand BP dropped further to 63/52. Bathing/dressing completed seated on BSC 2/2 symptomatic hypotension. UB bathing/dressing with set-up assist and LB bathing/dressing with Min A. Stand-pivot to wc with Min A. Oral hygiene seated at sink level with set-up assist. Session concluded with patient seated in recliner with call bell within reach, belt alarm activated and all needs met.  ? ?Therapy Documentation ?Precautions:  ?Precautions ?Precautions: Fall ?Precaution Comments: watch BP, orthostatic hypotension ?Required Braces or Orthoses: Other Brace (R UE cast) ?Other Brace: compression stockings and abdominal binder ?Restrictions ?Weight Bearing Restrictions: No ?RUE Weight Bearing: Weight bearing as tolerated ?Other Position/Activity Restrictions: R wrist fx and L 5th finger fx, contacting ortho for WB  status ?General: ?  ?Therapy/Group: Individual Therapy ? ?[name] ?02/24/2022, 9:32 AM ?

## 2022-02-24 NOTE — Progress Notes (Signed)
PROGRESS NOTE   Subjective/Complaints:  Pt reports shaking is better- Also feeling less ill- explained she has UTI again- Less dizzy when got up to void at 3am- Bowels also working better.     ROS:  Pt denies SOB, abd pain, CP, N/V/C/D, and vision changes       Objective:   No results found. Recent Labs    02/24/22 0544  WBC 5.5  HGB 10.9*  HCT 32.8*  PLT 286    Recent Labs    02/21/22 0947 02/24/22 0544  NA 132* 135  K 3.8 3.7  CL 92* 95*  CO2 26 27  GLUCOSE 103* 114*  BUN 71* 81*  CREATININE 3.09* 2.97*  CALCIUM 10.2 9.8    Intake/Output Summary (Last 24 hours) at 02/24/2022 0825 Last data filed at 02/23/2022 1338 Gross per 24 hour  Intake 240 ml  Output 400 ml  Net -160 ml        Physical Exam: Vital Signs Blood pressure (!) 105/54, pulse 92, temperature (!) 97.4 F (36.3 C), resp. rate 16, height '5\' 7"'$  (1.702 m), weight 82 kg, SpO2 96 %.        General: awake, alert, appropriate, sitting up in bed feeding self- ~50% breakfast eaten; NAD HENT: conjugate gaze; oropharynx moist CV: regular rate; no JVD Pulmonary: CTA B/L; no W/R/R- good air movement GI: soft, NT, ND, (+)BS Psychiatric: appropriate- brighter affect Neurological: alert- much less tremoring seen- was able to hold fork and feed self  Extremities: no LE edema seen this AM-  Skin: bilateral LE's/shins each with two 2" diameter wounds which are fairly superficial and with 95% granulation, clean appearing. Dressings C/D/I Neuro:  Alert and oriented x 3. Normal insight and awareness. Intact Memory. Normal language and speech. Cranial nerve exam unremarkable. RUE limited by [facility], LUE 3-4/5. LE 3 prox to 4- distally. Decreased LT in stocking glove pattern Musculoskeletal: right arm in Tool, Left 4th/5th digits splinted    Assessment/Plan: 1. Functional deficits which require 3+ hours per day of interdisciplinary therapy in a  comprehensive inpatient rehab setting. Physiatrist is providing close team supervision and 24 hour management of active medical problems listed below. Physiatrist and rehab team continue to assess barriers to discharge/monitor patient progress toward functional and medical goals  Care Tool:  Bathing    Body parts bathed by patient: Right lower leg, Right arm, Left arm, Left lower leg, Chest, Face, Abdomen, Front perineal area, Right upper leg, Left upper leg   Body parts bathed by helper: Buttocks     Bathing assist Assist Level: Moderate Assistance - Patient 50 - 74%     Upper Body Dressing/Undressing Upper body dressing   What is the patient wearing?: Pull over shirt    Upper body assist Assist Level: Moderate Assistance - Patient 50 - 74%    Lower Body Dressing/Undressing Lower body dressing      What is the patient wearing?: Pants     Lower body assist Assist for lower body dressing: Total Assistance - Patient < 25%     Toileting Toileting    Toileting assist Assist for toileting: Total Assistance - Patient < 25%  Transfers Chair/bed transfer  Transfers assist     Chair/bed transfer assist level: Moderate Assistance - Patient 50 - 74%     Locomotion Ambulation   Ambulation assist      Assist level: Minimal Assistance - Patient > 75% Assistive device: Walker-platform Max distance: 10   Walk 10 feet activity   Assist  Walk 10 feet activity did not occur: Safety/medical concerns (decreased activity tolerance)  Assist level: Minimal Assistance - Patient > 75% Assistive device: Walker-rolling   Walk 50 feet activity   Assist Walk 50 feet with 2 turns activity did not occur: Safety/medical concerns         Walk 150 feet activity   Assist Walk 150 feet activity did not occur: Safety/medical concerns         Walk 10 feet on uneven surface  activity   Assist Walk 10 feet on uneven surfaces activity did not occur: Safety/medical  concerns         Wheelchair     Assist Is the patient using a wheelchair?: Yes Type of Wheelchair: Manual    Wheelchair assist level: Dependent - Patient 0% (limited by upper extremity precautions and decreased activity tolerance)      Wheelchair 50 feet with 2 turns activity    Assist        Assist Level: Dependent - Patient 0%   Wheelchair 150 feet activity     Assist      Assist Level: Dependent - Patient 0%   Blood pressure (!) 105/54, pulse 92, temperature (!) 97.4 F (36.3 C), resp. rate 16, height '5\' 7"'$  (1.702 m), weight 82 kg, SpO2 96 %.  Medical Problem List and Plan: 1. Functional deficits secondary to debility after congestive heart failure and multiple other med/surg issues             -patient may shower if LUE/cast is covered             -ELOS/Goals: 14-16 days, supervision goals  D/c date3/21  Con't CIR- PT and OT- and added SLP eval for today- if (+), will order brain MRI.   Pt has mild-moderate cognitive impairments- per SLP.   Con't CIR- PT, OT and SLP- con't abd binder whenever up 2.  Antithrombotics: -DVT/anticoagulation:  sq heparin 5000u q8             -antiplatelet therapy: ASA '81mg'$  daily 3. Pain Management: hydrocodone 7.'5mg'$  qid             -prn tylenol             -increased scheduled gabapentin to '200mg'$  tid for neuropathic pain             -cymbalta  3/7- pain controlled usually- however due to low BP, staff concerned about giving Norco- will d/w team  3/9- will decrease gabapentin to 100 mg TID due to mentation  3/10- no change in pain, however slightly more alert this AM- pt insistent cannot reduce Norco dosing.  4. Mood: pt relatively up beat. Team to provide ego support             -cymbalta             -antipsychotic agents: n/a 5. Neuropsych: This patient is capable of making decisions on her own behalf. 6. Skin/Wound Care:    -xeroform, kerlix for chronic burn wounds. Wound clean/granulating             -pt followed  at wound clinic remotely             -  recent cultures prior to admit + for S Aureus and Pseudomonas             -will consult WOC RN for further recs for wound care  3/6- WOC suggested topping xeroform with dry gauze and silicone dressings-can use TEDs   3/8- dressings just changed- C/D/I-  7. Fluids/Electrolytes/Nutrition: encourage appropriate po             -added protein supps             -RD f/u             -reviewed importance of nutrition in her recovery 8. Acute on chronic CHF: EF 55-60%. Monitor weights daily wit strict I/O             -pt diuresed 7L on acute             -continue O2             -demadex '20mg'$  daily             -zebeta 2.'5mg'$  daily  3/7- down to 1kg- however Cr up to 2.83- will call Cards due to low BP on max dose of Midodrine and Cr issues  3/8- Wgt down slightly to 80.8 kg from 81.1 kg- Cr down with holding demadex x1 day and 250cc bolus- Cr better at 2.56 today and BUN 66 from 69- 3/9- Demadex restarts today-  wil recheck labs in AM  3/10- will recheck BMP this AM- and also ordered qmonday- drink 6-8 cups of water/day- asked her to 3.12- weight stable in last 3 days- however Cr up to 3.09 on Friday after she was seen- will recheck in AM and call Cards as required.  3/13- Cr down slightly to 2.97 and BUN up to 81- getting more dry- will call Cards to discuss what can be done about Demadex dosing? 9. Acute on chronic renal failure: Baseline SCr 1.4-->continue to moitor             --likely due to cardiorenal syndrome, recent contrast and Bactrim.  3/8- Cr 2.56- down from 2.83  3/10- will recheck this AM  3/12- Cr up to 3.09- will recheck in AM- since was Friday- has cardiorenal syndrome- so hopeful will improve, but also on Demadex-   3/13- Cr 2.97 and BUN up to 81- I think small dose of IVFs is in order, however will d/w Cards 10. Hx f HTN, now with Orthostatic Hypotension and sinus tach:  -Continue midodrine              -zebeta for rate control              -appropriate volume mtc             -acclimation with therapies             -daily orthostatic vs             -abdominal binder  -?vestibular component, meclizine prn  3/6- has midodrine '15mg'$  TID- max dose- and due to fluid status, cannot use Florinef. Still low BP- cannot add Flomax for lack of voiding due to low BP already.   3/7- went over this with pt- will call Cards.  3/8- Zebeta was held- BP 101/52- sitting- much better than 78G systolic yesterday 3/9- BP better sitting this AM  3/10- had more dizziness las tnight- but appears comfortable sitting EOB this AM.   3/12- standing without abd binder- explained needs to use except when in shower-  when up- also many patients become acclimated- hopeful this will occur.  11. RLS: requip 12. Urine retention: still retaining  -increase urecholine to '10mg'$  tid             -oob to void  -treat fungal UTI per below  3/6- cannot add flomax due to low BP. 3/7- Urecholine stopped due to low BP- still requiring in/out caths, but went over difficulty with pt since cannot use meds to treat Sx's.  3/10- was cathed again last night x2- but wasn't cathed night of 3/8 to 3/9  3/13- wasn't cathed overnight-  13. Slow transit constipation. No bm since 2/28             -miralax bid, senokot s at hs  -sorbitol today  3/6- LBM overnight- large- con't regimen  3/10- LBM 3/8 at night- reduced miralax to qday per pt request- might need to increase again. 3/12- made Miralax prn- since having so many BM's.   14. GI prophylaxis             -protonix bid             -carafate tid 15. Fungal /then bacteriaUTI: diflucan '100mg'$  daily thru 3/9  3/12- new UTI, it appears base don U/A and having more dysuria, and cloudy malodorous urine- U/A (+) moderate Leuks, no nitrites- will start Keflex 500 mg BID due to Cr issues x 5 days  3/13- GNR >100k- Cx pending 16. Recent right wrist fx, left 5th phalanx fx             -RUE SAC replaced 3/3--f/u with Dr. Grandville Silos as outpt              -left finger splint in place 17. Class 1 Obesity 18. Pulmonary HTN: see #8  3/6- will con't Albuterol nebs prn- ask nursing to give.  19. Blisters on hands?   3/9- pt says has blisters- none seen on hands where she's pointing? Confusion vs old and have healed?  I spent a total of 41   minutes on total care today- >50% coordination of care- due to d/w PA and calling Cards- wait to d/c IV if needs IVFs.      LOS: 9 days A FACE TO FACE EVALUATION WAS PERFORMED  Anastasia Tompson 02/24/2022, 8:25 AM

## 2022-02-24 NOTE — Progress Notes (Signed)
Physical Therapy Session Note ? ?Patient Details  ?Name: [name] ?MRN: [id number] ?Date of Birth: 1946-12-15 ? ?Today's Date: 02/24/2022 ?PT Individual Time: 1300-1400 ?PT Individual Time Calculation (min): 60 min  ? ?Short Term Goals: ?Week 1:  PT Short Term Goal 1 (Week 1): Patient will perform bed mobility with min A consistently. ?PT Short Term Goal 2 (Week 1): Patient will perform basic transfers with min A consistently. ?PT Short Term Goal 3 (Week 1): Patient will ambulate >25 feet using LRAD. ?PT Short Term Goal 4 (Week 1): Patient will tolerate static standing with min A with 1-2 upper extremity support >2 min. ? ?Skilled Therapeutic Interventions/Progress Updates:  ?  Pt received in recliner and agreeable to therapy.  Pt reports pain in extremities, 5/10, premedicated. Rest and positioning provided as needed. Pt with THTs and binder already in place. BP in recliner 110/59.Pt stood to [place] with min A and performed standing marches, but became lightheaded within 30 seconds. Required >2 min in sitting to recover. Pt reported lightheadedness, dizziness, and nausea. After recovering, pt request to use bathroom. Pt ambulated to commode over toilet with CGA, asymptomatic until pulling pants down over hips. Continent bladder and bowel void and supervision for anterior hygiene and tot A for posterior hygiene in standing, asymptomatic throughout. Pt ambulated to sink and washed hands in standing with supervision. Returned to w/c to rest. Pt then ambulated x ~300 ft with CGA and PFRW, with w/c follow, pt asymptomatic throughout. Pt seems to better when not standing still. Pt returned to room and to bed with supervision and was left with all needs in reach and alarm active.  ? ?Therapy Documentation ?Precautions:  ?Precautions ?Precautions: Fall ?Precaution Comments: watch BP, orthostatic hypotension ?Required Braces or Orthoses: Other Brace (R UE cast) ?Other Brace: compression stockings and abdominal  binder ?Restrictions ?Weight Bearing Restrictions: No ?RUE Weight Bearing: Weight bearing as tolerated ?Other Position/Activity Restrictions: R wrist fx and L 5th finger fx, contacting ortho for WB status ? ? ? ? ?Therapy/Group: Individual Therapy ? ?[place] ?02/24/2022, 1:17 PM  ?

## 2022-02-24 NOTE — Progress Notes (Signed)
Speech Language Pathology Daily Session Note ? ?Patient Details  ?Name: [name] ?MRN: [id number] ?Date of Birth: June 04, 1946 ? ?Today's Date: 02/24/2022 ?SLP Individual Time: 9357-0177 ?SLP Individual Time Calculation (min): 45 min ? ?Short Term Goals: ?Week 1: SLP Short Term Goal 1 (Week 1): Pt will demonstrate sustained attention in functional tasks in 3-45 minute intervals with min A verbal cues for redirection. ?SLP Short Term Goal 2 (Week 1): Pt will demonstrate daily and novel recall with supervision A verbal cues for internal/external aids. ?SLP Short Term Goal 3 (Week 1): Pt will demonstrate mildly complex problem solving skills with supervision A verbal cues. ?SLP Short Term Goal 4 (Week 1): Pt will demonstrate self-monitoring and self-correction of errors in functional tasks with min A verbal cues. ?SLP Short Term Goal 5 (Week 1): Pt will demonstrate orientation to time with supervision A verbal cues for internal/external aids. ? ?Skilled Therapeutic Interventions: Skilled ST service focused on cognitive skills. Pt demonstrated improved mentation compared to at evaluation with Probation officer. Pt supports continued UTI, but changes in antibiotics and improvement in mentation. SLP created current medication list and pt was able to recall novel verse previously consumed medication. Pt filled out TIB pill organizer with ability to self-correct errors made due to digital neuropathy. Pt completed task mod I in problem solving, error awareness and recall. Pt was left with chair alarm set and call bell within reach. Recommend to continue ST services. ?   ? ?Pain ?Pain Assessment ?Pain Score: 0-No pain ? ?Therapy/Group: Individual Therapy ? ?[name] ?02/24/2022, 12:36 PM ?

## 2022-02-24 NOTE — Progress Notes (Signed)
Contacted cardiology re: worsening of renal status. They recommended holding Demadex for 2 days, recheck renal status and if improved to resume medication.  ? ?Patient also expressing concerns re: right wrist. She was set to follow up with Dr. Grandville Silos 2 weeks ago and feels that "cast is getting soft".  Cast examined and relayed it is still intact but decrease in edema likely making it feel that way. Contacted Dr. Biagio Borg office for input. She was set for f/u last week in Feb for repeat X rays and cast change?  Dr. Grandville Silos is out of office till Wed--message left for call back w/instructions.  ?

## 2022-02-24 NOTE — Progress Notes (Deleted)
? ? Advanced Heart Failure Rounding Note ? ?PCP-Cardiologist: Sinclair Grooms, MD  ? ?Subjective:   ? ?Recently admitted to [facility] with acute on chronic diastolic CHF, orthostatic hypotension, AKI on CKD III, nausea/abdominal pain.  Discharged to CIR on 03/04. ? ?We were asked to see today for recommendations regarding diuretics d/t worsening renal function. ? ?Scr 2.34>2.8>2.56>3.09>2.97 ? ?Currently on 20 mg Torsemide daily ? ?Started on keflex for UTI yesterday. ? ? ? ? ?Objective:   ?Weight Range: ?82 kg ?Body mass index is 28.31 kg/m?.  ? ?Vital Signs:   ?Temp:  [97.4 ?F (36.3 ?C)-98.7 ?F (37.1 ?C)] 98.7 ?F (37.1 ?C) (03/13 1318) ?Pulse Rate:  [90-96] 96 (03/13 1318) ?Resp:  [16] 16 (03/13 1318) ?BP: (105-117)/(54-59) 110/59 (03/13 1318) ?SpO2:  [96 %-100 %] 100 % (03/13 1318) ?Weight:  [82 kg] 82 kg (03/13 0500) ?Last BM Date : 02/21/22 ? ?Weight change: ?Filed Weights  ? 02/22/22 0500 02/23/22 0500 02/24/22 0500  ?Weight: 81.4 kg 81.5 kg 82 kg  ? ? ?Intake/Output:  ? ?Intake/Output Summary (Last 24 hours) at 02/24/2022 1458 ?Last data filed at 02/24/2022 1419 ?Gross per 24 hour  ?Intake 118 ml  ?Output --  ?Net 118 ml  ?  ? ? ?Physical Exam  ?  ?General:  Well appearing. No resp difficulty ?HEENT: Normal ?Neck: Supple. JVP . Carotids 2+ bilat; no bruits. No lymphadenopathy or thyromegaly appreciated. ?Cor: PMI nondisplaced. Regular rate & rhythm. No rubs, gallops or murmurs. ?Lungs: Clear ?Abdomen: Soft, nontender, nondistended. No hepatosplenomegaly. No bruits or masses. Good bowel sounds. ?Extremities: No cyanosis, clubbing, rash, edema ?Neuro: Alert & orientedx3, cranial nerves grossly intact. moves all 4 extremities w/o difficulty. Affect pleasant ? ? ?Labs  ?  ?CBC ?Recent Labs  ?  02/24/22 ?0947  ?WBC 5.5  ?NEUTROABS 3.4  ?HGB 10.9*  ?HCT 32.8*  ?MCV 99.1  ?PLT 286  ? ?Basic Metabolic Panel ?Recent Labs  ?  02/24/22 ?0962  ?NA 135  ?K 3.7  ?CL 95*  ?CO2 27  ?GLUCOSE 114*  ?BUN 81*  ?CREATININE 2.97*   ?CALCIUM 9.8  ? ?Liver Function Tests ?Recent Labs  ?  02/24/22 ?8366  ?AST 21  ?ALT 16  ?ALKPHOS 91  ?BILITOT 0.6  ?PROT 5.9*  ?ALBUMIN 2.7*  ? ?No results for input(s): LIPASE, AMYLASE in the last 72 hours. ?Cardiac Enzymes ?No results for input(s): CKTOTAL, CKMB, CKMBINDEX, TROPONINI in the last 72 hours. ? ?BNP: ?BNP (last 3 results) ?Recent Labs  ?  02/03/22 ?1544  ?BNP 841.9*  ? ? ?ProBNP (last 3 results) ?No results for input(s): PROBNP in the last 8760 hours. ? ? ?D-Dimer ?No results for input(s): DDIMER in the last 72 hours. ?Hemoglobin A1C ?No results for input(s): HGBA1C in the last 72 hours. ?Fasting Lipid Panel ?No results for input(s): CHOL, HDL, LDLCALC, TRIG, CHOLHDL, LDLDIRECT in the last 72 hours. ?Thyroid Function Tests ?No results for input(s): TSH, T4TOTAL, T3FREE, THYROIDAB in the last 72 hours. ? ?Invalid input(s): FREET3 ? ?Other results: ? ? ?Imaging  ? ? ?No results found. ? ? ?Medications:   ? ? ?Scheduled Medications: ? aspirin  81 mg Oral QHS  ? calcium carbonate  1 tablet Oral Q breakfast  ? cephALEXin  500 mg Oral Q12H  ? DULoxetine  60 mg Oral BID  ? gabapentin  100 mg Oral TID  ? heparin  5,000 Units Subcutaneous Q8H  ? HYDROcodone-acetaminophen  1 tablet Oral QID  ? meclizine  12.5 mg Oral  TID  ? melatonin  5 mg Oral QHS  ? midodrine  15 mg Oral TID WC  ? pantoprazole  40 mg Oral BID  ? rOPINIRole  1 mg Oral Daily  ? And  ? rOPINIRole  1 mg Oral QHS  ? senna  1 tablet Oral Daily  ? sucralfate  1 g Oral TID WC & HS  ? torsemide  20 mg Oral Daily  ? triamcinolone ointment  1 application. Topical BID  ? Vitamin D (Ergocalciferol)  50,000 Units Oral Q7 days  ? ? ?Infusions: ? ? ?PRN Medications: ?acetaminophen, albuterol, benzocaine, ondansetron (ZOFRAN) IV, polyethylene glycol ? ? ?Assessment/Plan  ? ?1. Acute on chronic diastolic CHF: Echo from this admission shows EF 55-60%, mild LVH, grade II diastolic dysfunction, normal RV size and systolic function, mild aortic stenosis with  mild-moderate aortic insufficiency, mild-moderate MR. No significant CAD on remote cath but had anomalous RCA.  Cardiolite this admission showed EF 50%, no ischemia/infarction.    ?- She has severe peripheral neuropathy of uncertain etiology, orthostatic hypotension likely from autonomic neuropathy, and bilateral carpal tunnel syndrome.  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  However, her echo is not very impressive for amyloidosis (minimal LVH), the cMRI is equivocal for amyloidosis (not classic LGE pattern but ECV percentage high), and the PYP scan does not look suggestive. ?- Plan to repeat  PYP scan in 4-5 months for progression, will send for genetic testing for hereditary transthyretin amyloidosis as outpatient as well.  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 ? Significance. ?- Diuresed with IV lasix. Now down 27 lb. Scr bumped to 2.86. Later improved to 2.56. Scr back up to 3.09 on 03/10>2.98 today ?- Continue midodrine 15 mg TID  ?- Continue bisoprolol 2.5 mg daily for tachycardia ?- SGLT2 inhibitor in future when renal function improves. ?2. AKI on CKD stage 3: Prior creatinine 1.4.  At admission, she was 1.74. Normal CO on RHC. Scr up to 2.86. Contrast nephropathy, Bactrim use and diuretics likely played a role.   ?-Scr had improved to 2.56, now back up to 3. ?- Continue midodrine 15 mg TID ?3. Skin: Concern skin infection, primarily lower legs.  Culture from leg lesions at outside dermatologist positive for S. Aureus + Pseudomonas. Seen by ID, no active cellulitis apparent, antibiotics stopped.   ?4. Orthostatic hypotension: In setting of volume overload.  Suspect autonomic neuropathy. Improving.  ?- Wear compression stockings.  ?- Wear abdominal binder.  ?- Midodrine as above.  ?5. 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 No longer active. ?6. Aortic valve disorder: Mild AS, mild-moderate AI on 2/23 echo.  ?7. Elevated  troponin: Mild elevation with no trend.  No chest pain currently.  Cardiolite with no ischemia.  Suspect demand ischemia from volume overload.  ?8. Nausea/abdominal pain: Gall stones on US abdomen but no evidence of cholecystitis. AF. No leukocytosis. CT abdomen/pelvis with cholelithiasis, urinary bladder distention, stool throughout colon consistent with constipation. It is possible that this was due to constipation as well as vertigo/BPPV.  ?- Continue treatment of BPPV, meclizine and scopolamine.  ?9. Tachycardia:  ?- Rate improved to 80s-90s with bisoprolol 2.5 mg daily and stopping Adderall. ?10. UTI: ?-Urine culture grew >100,000 colonies Ecoli, susceptibilities pending ?-On keflex ? ? ?Length of Stay: 9 ? ?Katriel Cutsforth N, PA-C  ?02/24/2022, 2:58 PM ? ?Advanced Heart Failure Team ?Pager [phone] (M-F; 7a - 5p)  ?Please contact [facility] for night-coverage after hours (5p -7a ) and weekends on [email or web address] ?  ?

## 2022-02-25 ENCOUNTER — Inpatient Hospital Stay (HOSPITAL_COMMUNITY): Payer: Medicare Other

## 2022-02-25 LAB — URINE CULTURE: Culture: >=100000 — AB

## 2022-02-25 IMAGING — DX DG WRIST 2V*R*
1 series · 2 of 2 positions shown · non-contrast
Comparison: None.

CLINICAL DATA: Right wrist fracture.

EXAM:
RIGHT WRIST - 2 VIEW

[Series 1: wrist · 0.14mm/px · 2 of 2 slices shown]
[im 1/2]
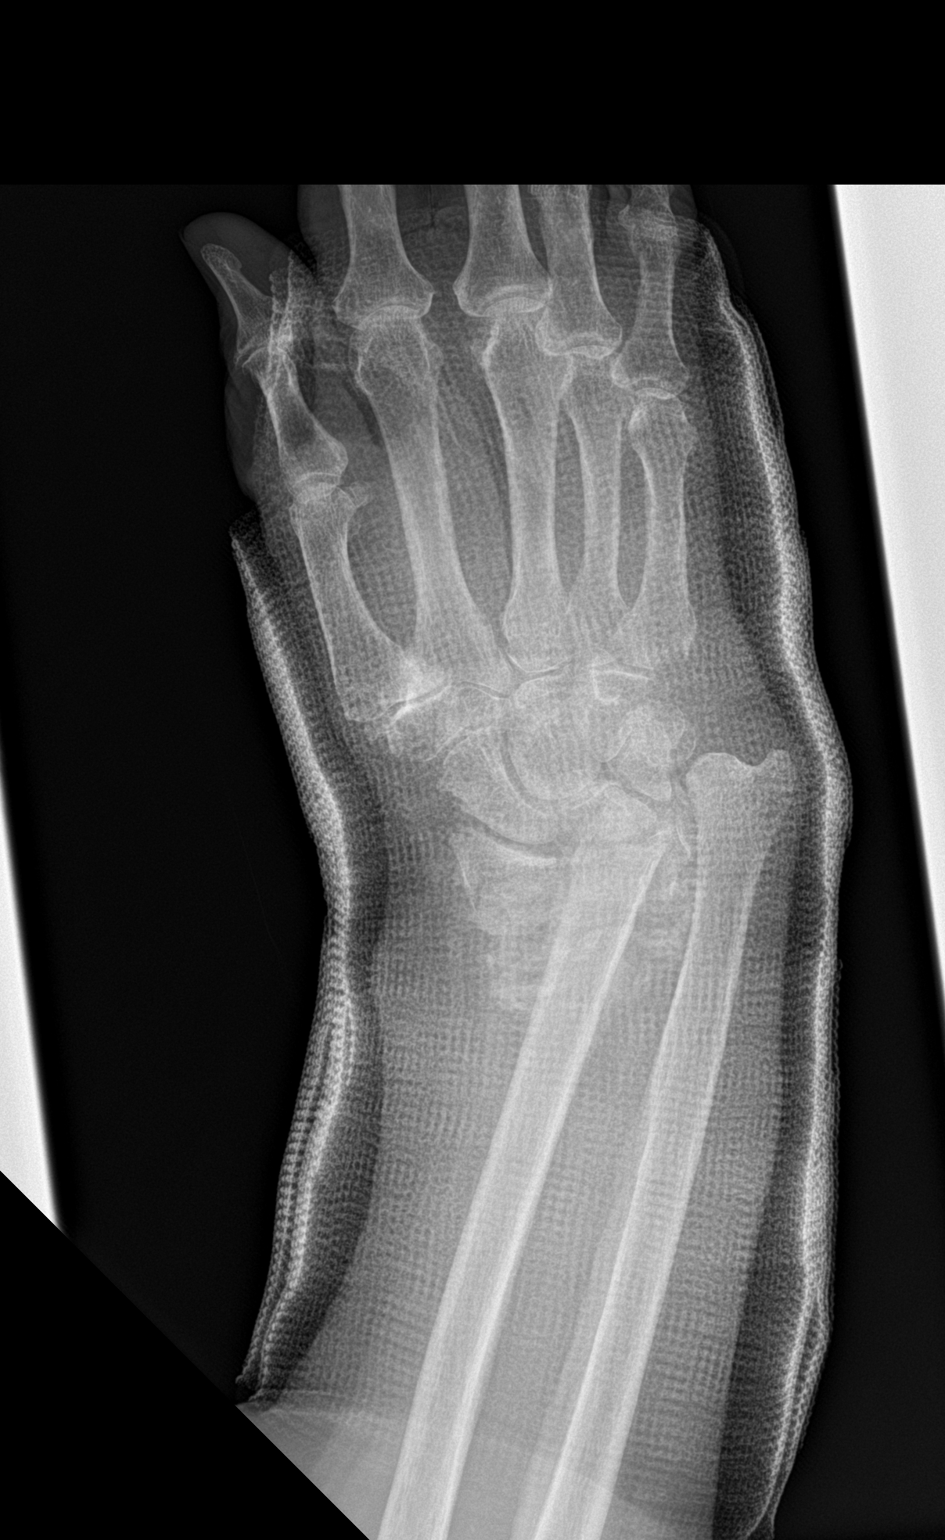
[im 2/2]
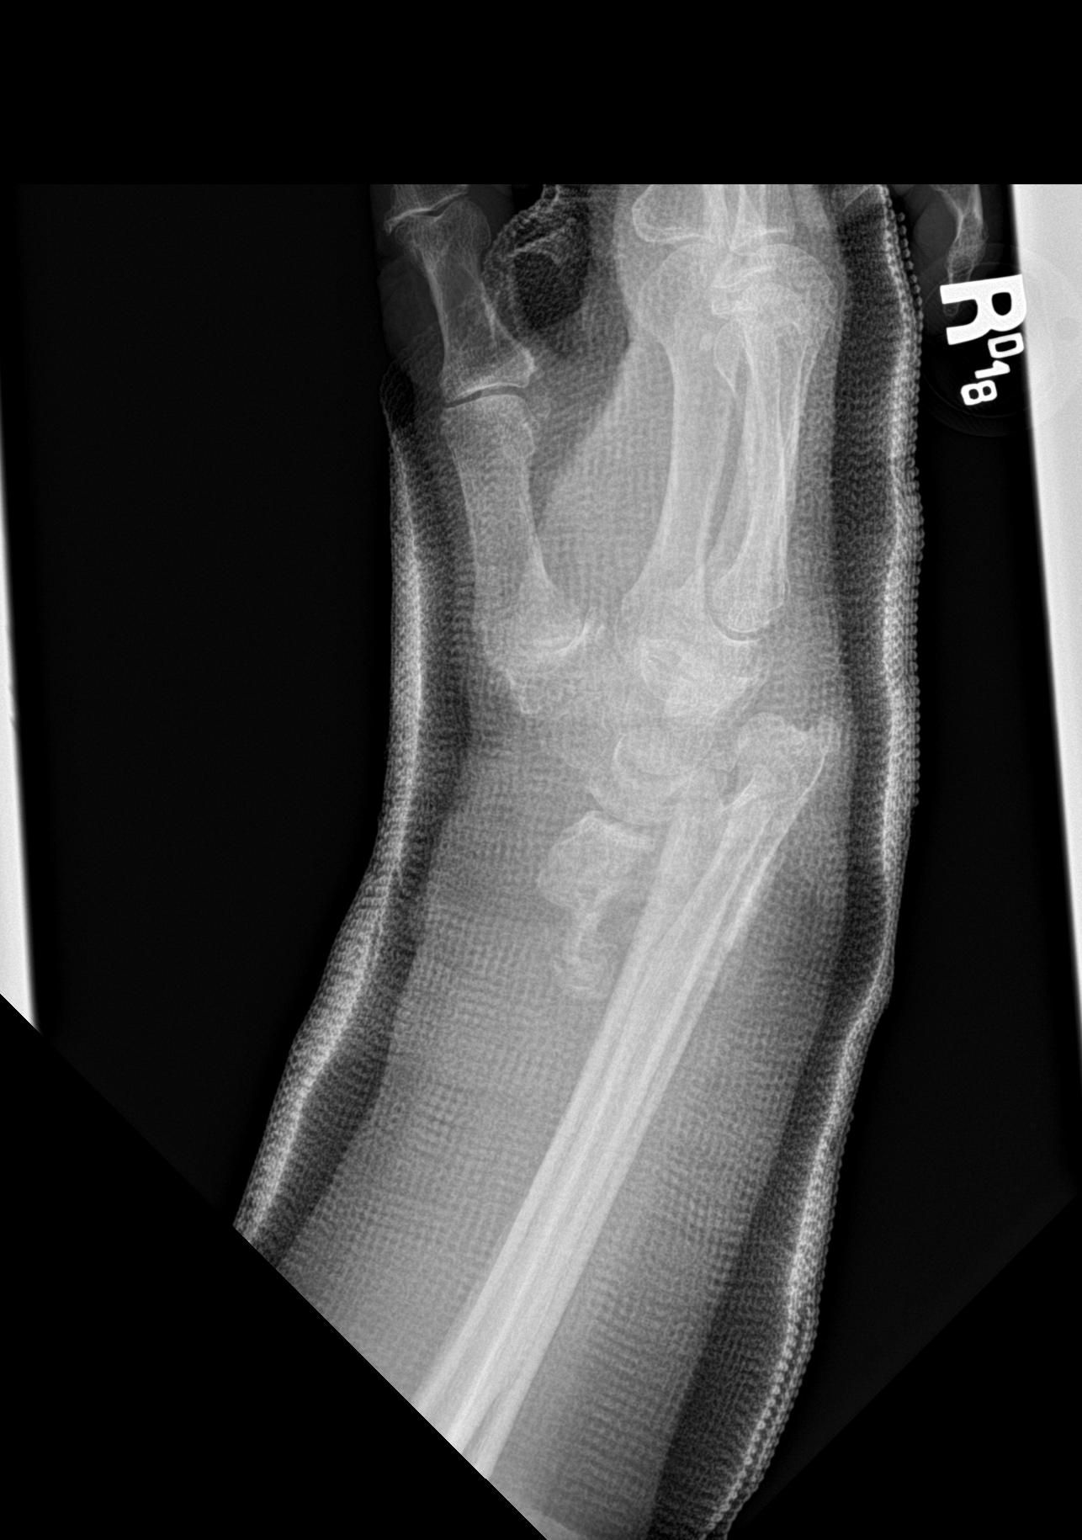

[2 of 2 positions shown; findings below may reference images not displayed]

FINDINGS: A distal radial fracture is noted. The fracture is displaced
posteriorly. 3 cm of foreshortening present. Callus formation noted
about the fracture fragments. No additional fracture is present.
Other degenerative changes noted. Fiberglass cast is in place.
IMPRESSION: Posteriorly displaced and foreshortened distal radial fracture with
some callus formation.

## 2022-02-25 MED ORDER — FLAVOXATE HCL 100 MG PO TABS
100.0000 mg | ORAL_TABLET | Freq: Three times a day (TID) | ORAL | Status: DC | PRN
  Administered 2022-02-25: 100 mg via ORAL
  Filled 2022-02-25 (×2): qty 1

## 2022-02-25 NOTE — Progress Notes (Addendum)
Patient ID: [name], female   DOB: 12/02/1946, 75 y.o.   MRN: [id number] Met with pt to discuss tam conference progress this week and discharge 3/21. Discussed will call husband to see if can come in for education and go from there. She feels this worker will get some resistance to this. Did call husband and he informed this worker she is not coming home she is not ready and he can not do any care for her. He reports he still works from home and is 76 years old and has bad back and bad knees. He has not plans for her to come home from here. He wants her to go to a SNF and then maybe go home from there. When asked if he would come in and go to therapies with her he said he has seen her in therapies and knows she is not ready to be at home. He feels she is manipulative and will say anything to get home and once there he can't take care of her. Discussed she does not want to go to a NH and she can't be forced to go. He will need to discuss his issues about her coming home with her. He reports he has when here. He refused to come in for education and wants her to go to a NH from here. Will talk with pt again and work on discharge plans. ? ?3:36 PM Have spoken with pt regarding husband's concerns and will give her a private duty list and SNF list. She will talk with husband tonight when he is here to visit. ?

## 2022-02-25 NOTE — Progress Notes (Signed)
Occupational Therapy Session Note ? ?Patient Details  ?Name: [name] ?MRN: [id number] ?Date of Birth: 1946-03-27 ? ?Today's Date: 02/25/2022 ?OT Individual Time: 1302-1400 ?OT Individual Time Calculation (min): 58 min  ? ? ?Short Term Goals: ?Week 1:  OT Short Term Goal 1 (Week 1): Pt will don pants with LRAD with no more than mod A ?OT Short Term Goal 2 (Week 1): Pt will utilize adaptive strategies to don shirt with (S) ?OT Short Term Goal 3 (Week 1): Pt will complete toilet transfer with CGA ? ?Skilled Therapeutic Interventions/Progress Updates:  ?Patient met lying supine in bed in agreement with OT treatment session. 5/10 pain reported in bilateral hands and feet 2/2 neuropathy. Patient with desire to void bladder. Sit to stand from slightly elevated EOB to PFRW with Min A. Mild posterior bias with cues for anterior weight shift. Functional mobility to commode in bathroom and toilet transfer to [facility] over standard height toilet with Min A. 3/3 parts of toileting task with Min A overall. Patient then completed walk-in shower transfer with Min A and cues for hand placement. R arm hard cast and PIV covered. UB bathing/dressing with Min A.  Able to thread underwear in sitting without external assist. Max A to don thigh high TED hose and socks with forward chaining. RN entered at conclusion of session to replace wet bandages on BLE with patient seated on shower chair. RN to assist patient with rest of LB dressing and transfer to recliner with [place].  ? ?Therapy Documentation ?Precautions:  ?Precautions ?Precautions: Fall ?Precaution Comments: watch BP, orthostatic hypotension ?Required Braces or Orthoses: Other Brace (R UE cast) ?Other Brace: compression stockings and abdominal binder ?Restrictions ?Weight Bearing Restrictions: Yes ?RUE Weight Bearing: Weight bearing as tolerated ?Other Position/Activity Restrictions: R wrist fx and L 5th finger fx, contacting ortho for WB status ?General: ?  ? ?Therapy/Group:  Individual Therapy ? ?Garwood Wentzell R [place] ?02/25/2022, 12:15 PM ?

## 2022-02-25 NOTE — Progress Notes (Signed)
Physical Therapy Weekly Progress Note ? ?Patient Details  ?Name: [name] ?MRN: [id number] ?Date of Birth: Aug 09, 1946 ? ?Beginning of progress report period: February 16, 2022 ?End of progress report period: February 25, 2022 ? ?Today's Date: 02/25/2022 ?PT Individual Time: 5929-2446 ?PT Individual Time Calculation (min): 60 min  ? ?Patient has met 3 of 3 short term goals.  Pt continues to be limited by hypotension, but ambulating up to 300 ft with PFRW. ? ?Patient continues to demonstrate the following deficits muscle weakness, decreased cardiorespiratoy endurance, and decreased standing balance, decreased postural control, and decreased balance strategies and therefore will continue to benefit from skilled PT intervention to increase functional independence with mobility. ? ?Patient progressing toward long term goals..  Continue plan of care. ? ?PT Short Term Goals ?Week 1:  PT Short Term Goal 1 (Week 1): Patient will perform bed mobility with min A consistently. ?PT Short Term Goal 1 - Progress (Week 1): Met ?PT Short Term Goal 2 (Week 1): Patient will perform basic transfers with min A consistently. ?PT Short Term Goal 2 - Progress (Week 1): Met ?PT Short Term Goal 3 (Week 1): Patient will ambulate >25 feet using LRAD. ?PT Short Term Goal 3 - Progress (Week 1): Met ?PT Short Term Goal 4 (Week 1): Patient will tolerate static standing with min A with 1-2 upper extremity support >2 min. ?PT Short Term Goal 4 - Progress (Week 1): Met ?Week 2:  PT Short Term Goal 1 (Week 2): =LTGs d/t ELOS ? ?Skilled Therapeutic Interventions/Progress Updates:  ?pt received in bed and agreeable to therapy. No complaint of pain. Pt reporting her BP had been very low this morning (40's). Pt wearing loose knee high TED hose and binder poorly applied and not providing adequate compression. Switched to thigh highs tot A, pt immediately reporting that she felt better and less light headed. Also reapplied binder to provide appropriate  compression. Pt then stood from bed and ambulated to bathroom with CGA. Continent bladder void, but noted to have dirty underwear. Pt normally continent and reports that she was not assisted with hygiene when she last went to bathroom. This therapist provided tot A to complete hygiene, cleaning remnants of previous BM. This is especially concerning as pt has had multiple UTIs. Donned clean underwear with tot A. Pt then ambulated to sink and stood to wash hands and comb hair with supervision. MD in/out for consult and to speak with pt's daughter on phone. Pt missed 15 minutes for MD consult, will make up as able.  Pt then ambulated x 350 ft with PFRW and CGA, w/c follow for safety, with no orthostatic symptoms noted. Pt requested to return to bed and did so with supervision, was left with all needs in reach and alarm active.  ? ?Therapy Documentation ?Precautions:  ?Precautions ?Precautions: Fall ?Precaution Comments: watch BP, orthostatic hypotension ?Required Braces or Orthoses: Other Brace (R UE cast) ?Other Brace: compression stockings and abdominal binder ?Restrictions ?Weight Bearing Restrictions: Yes ?RUE Weight Bearing: Weight bearing as tolerated ?Other Position/Activity Restrictions: R wrist fx and L 5th finger fx, contacting ortho for WB status ?General: ?PT Amount of Missed Time (min): 15 Minutes ?PT Missed Treatment Reason: Other (Comment) (MD consult) ? ?Therapy/Group: Individual Therapy ? ?[place] ?02/25/2022, 12:30 PM  ?

## 2022-02-25 NOTE — Progress Notes (Signed)
Orthopedic Tech Progress Note ?Patient Details:  ?[name] ?20-Apr-1946 ?[id number] ? ?Casting ?Type of Cast: Short arm cast ?Cast Location: RUE ?Cast Material: Fiberglass ?Cast Intervention: Removal ? ?Post Interventions ?Patient Tolerated: Well ?Instructions Provided: Care of device ? ? ? ?Ortho Devices ?Type of Ortho Device: Velcro wrist splint ?Ortho Device/Splint Location: RUE ?Ortho Device/Splint Interventions: Application, Ordered, Adjustment ?  ?Post Interventions ?Patient Tolerated: Well ?Instructions Provided: Care of device ? ?[name] ?02/25/2022, 3:45 PM ? ?

## 2022-02-25 NOTE — Progress Notes (Signed)
Orthopedic Tech Progress Note ?Patient Details:  ?[name] ?1946-01-30 ?[id number] ? ?Went to remove cast from patient arm, at this moment she was getting out the shower and getting dressed. Working with OT/PT ? ?Patient ID: [name], female   DOB: 1946/05/24, 76 y.o.   MRN: [id number] ? ?[name] ?02/25/2022, 2:16 PM ? ?

## 2022-02-25 NOTE — Patient Care Conference (Signed)
Inpatient RehabilitationTeam Conference and Plan of Care Update ?Date: 02/25/2022   Time: 11:40 AM  ? ? ?Patient Name: [name]      ?Medical Record Number: [id number]  ?Date of Birth: 02-15-1946 ?Sex: Female         ?Room/Bed: 4M12C/4M12C-01 ?Payor Info: Payor: MEDICARE / Plan: MEDICARE PART A AND B / Product Type: *No Product type* /   ? ?Admit Date/Time:  02/15/2022  2:12 PM ? ?Primary Diagnosis:  Debility ? ?Hospital Problems: Principal Problem: ?  Debility ? ? ? ?Expected Discharge Date: Expected Discharge Date: 03/04/22 ? ?Team Members Present: ?Physician leading conference: Dr. Courtney Heys ?Social Worker Present: Ovidio Kin, LCSW ?Nurse Present: Dorthula Nettles, RN ?PT Present: Ailene Rud, PT ?OT Present: Turner Daniels, OT ?SLP Present: Charolett Bumpers, SLP ?PPS Coordinator present : Gunnar Fusi, SLP ? ?   Current Status/Progress Goal Weekly Team Focus  ?Bowel/Bladder ? ? cont of b/b  remain cont  assess q shift and prn   ?Swallow/Nutrition/ Hydration ? ?           ?ADL's ? ? Min to Mod A LB ADLs, MIn A STS with PFRW. Limited by orthostatic hypotension. Improved cognition although continued deficits.  MIN toilet, Supervision transfers  ADL re-education, functional transfers, standing balance, energy conservation and activity tolerance   ?Mobility ? ? min A STS, CGA-supervision gait, ambulated 300 ft  CGA overall  OOB tolerance, BP management   ?Communication ? ?           ?Safety/Cognition/ Behavioral Observations ? supervision- Mod I, great improvement in mentation  Mod I  reassess cognition, calendar task, mild-complex problem solving, emergent/anticipatory awareness, and recall   ?Pain ? ? no current complaints of pain  pain <3 scheduled pain medications  assess q shift and prn   ?Skin ? ? CDI  no breakdown  assess skin q shift and prn   ? ? ?Discharge Planning:  ?Husband has concerns regarding her care neded. Pt has made good gains this week and wants to go home from rehab. Will get  husband in for education   ?Team Discussion: ?Experiencing more tremors. Seems to happen when BP is low, d/t hypotension. Current Ted hose too loose. Therapy added correct size and thigh highs. Adding abdominal binder as well. Has required I&O caths PRN. PVR's reports emptying now. Most likely d/t pos UTI. Cr is up and down. Doing follow-up x-ray on wrist. Pain medication scheduled and controlled. Will need husband to come in for family education.  ? ?Patient on target to meet rehab goals: ?yes, mod I goals. Currently min/mod assist ADL's. Moving better. Ambulated 333f. Improving with SLP, may discharge. Still orthostatic with therapy. ? ?*See Care Plan and progress notes for long and short-term goals.  ? ?Revisions to Treatment Plan:  ?Adjusting medications. ?  ?Teaching Needs: ?Family education, medication/pain management, BP management, transfer/gait training, etc. ?  ?Current Barriers to Discharge: ?Decreased caregiver support and Lack of/limited family support ? ?Possible Resolutions to Barriers: ?Family education ?[facility] ?Order recommended DME ?  ? ? Medical Summary ?Current Status: last cathed 3/12- UTI- on Keflex- pain - on Norco-cognition better; pt insistent- not going to SNF ? Barriers to Discharge: Behavior;Decreased family/caregiver support;Home enviroment access/layout;Other (comments);Medical stability ? Barriers to Discharge Comments: urinary retention intermittent- bad orthostatic hypotension- worsening Cr due to Demadex/maybe UTI- ?Possible Resolutions to [employer]: getting R wrist xray- for f/u; pt refusing SNF; is min-mod A; really limited by BP; SBP 62/50s-  before in shower without abd binder- try to wear binfder/TEDs into shower- then take off before water started- doing better cognitively- d/c 3/21 ? ? ?Continued Need for Acute Rehabilitation Level of Care: The patient requires daily medical management by a physician with specialized training in  physical medicine and rehabilitation for the following reasons: ?Direction of a multidisciplinary physical rehabilitation program to maximize functional independence : Yes ?Medical management of patient stability for increased activity during participation in an intensive rehabilitation regime.: Yes ?Analysis of laboratory values and/or radiology reports with any subsequent need for medication adjustment and/or medical intervention. : Yes ? ? ?I attest that I was present, lead the team conference, and concur with the assessment and plan of the team. ? ? ?Dorthula Nettles G ?02/25/2022, 4:15 PM  ? ? ? ? ? ? ?

## 2022-02-25 NOTE — Progress Notes (Addendum)
RN was notified re: wounds on lower extremities. Per LPN noted yellowish slough. RN and Pam PA went back to check wounds. Cleaned with soap and water. Pam took new pictures and Recommended to place WOC consult to update treatment. Wound bed red ; no exudate noted. Petrolatum dressing placed.covered with dry dressing and foam was ordered while waiting for consult. ?
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
## 2022-02-25 NOTE — Progress Notes (Signed)
Speech Language Pathology Weekly Progress and Session Note ? ?Patient Details  ?Name: [name] ?MRN: [id number] ?Date of Birth: Jul 07, 1946 ? ?Beginning of progress report period: February 19, 2022 ?End of progress report period: February 25, 2022 ? ?Today's Date: 02/25/2022 ?SLP Individual Time: 4327-6147 ?SLP Individual Time Calculation (min): 56 min ? ?Short Term Goals: ?Week 1: SLP Short Term Goal 1 (Week 1): Pt will demonstrate sustained attention in functional tasks in 30-45 minute intervals with min A verbal cues for redirection. ?SLP Short Term Goal 1 - Progress (Week 1): Met ?SLP Short Term Goal 2 (Week 1): Pt will demonstrate daily and novel recall with supervision A verbal cues for internal/external aids. ?SLP Short Term Goal 2 - Progress (Week 1): Met ?SLP Short Term Goal 3 (Week 1): Pt will demonstrate mildly complex problem solving skills with supervision A verbal cues. ?SLP Short Term Goal 3 - Progress (Week 1): Met ?SLP Short Term Goal 4 (Week 1): Pt will demonstrate self-monitoring and self-correction of errors in functional tasks with min A verbal cues. ?SLP Short Term Goal 4 - Progress (Week 1): Met ?SLP Short Term Goal 5 (Week 1): Pt will demonstrate orientation to time with supervision A verbal cues for internal/external aids. ?SLP Short Term Goal 5 - Progress (Week 1): Met ? ?  ?New Short Term Goals: ?Week 2: SLP Short Term Goal 1 (Week 2): STG=LTG due to short ELOS (3/21) ? ?Weekly Progress Updates: Pt made great progress meeting 5 out 5 goals, nearing likely baseline cognition. Pt's UTI is being treated and pt self-reports increase in cognitive skills. SLP facilitated medication and time management skills focusing on mildly complex problem solving, error awareness, recall and sustained attention. Pt's barriers to discharge are short term recall, complex problem solving, emergent/anticipatory awareness and higher level attention. Education has not been completed. If pt continues to improve, ST  services will likely end prior to hospital d/c. Pt benefited from skilled ST services in order to maximize functional independence and reduce burden of care, likely intermittent supervision at discharge with possible continued skilled ST services. ?  ? ? ?Intensity: Minumum of 1-2 x/day, 30 to 90 minutes ?Frequency: 3 to 5 out of 7 days ?Duration/Length of Stay: 3/21 ?Treatment/Interventions: Cognitive remediation/compensation;Cueing hierarchy;Internal/external aids;Patient/family education;Functional tasks ? ? ?Daily Session ? ?Skilled Therapeutic Interventions: Skilled ST services focused on cognitive skills. Pt reported feelings of dizziness with am low BP, nurse was present. SLP planned to reassess pt's cognitive skills due to noted improvement, but will do so at a later date. SLP facilitated mildly complex problem solving, error awareness and recall skills in calendar task, pt required min A fade to supervision A verbal cues. Pt was left in room with call bell within reach and bed alarm set. SLP recommends to continue skilled services.  ? ?General  ?  ?Pain ?Pain Assessment ?Pain Score: 0-No pain ? ?Therapy/Group: Individual Therapy ? ?Murline Weigel ?02/25/2022, 12:28 PM ? ? ? ? ? ? ?

## 2022-02-26 MED ORDER — ONDANSETRON HCL 4 MG PO TABS
4.0000 mg | ORAL_TABLET | ORAL | Status: DC | PRN
  Administered 2022-02-26 – 2022-03-04 (×11): 4 mg via ORAL
  Filled 2022-02-26 (×12): qty 1

## 2022-02-26 MED ORDER — ROPINIROLE HCL 1 MG PO TABS
1.0000 mg | ORAL_TABLET | Freq: Every day | ORAL | Status: DC
  Administered 2022-02-26 – 2022-03-03 (×6): 1 mg via ORAL
  Filled 2022-02-26 (×7): qty 1

## 2022-02-26 NOTE — Progress Notes (Signed)
Occupational Therapy Session Note ? ?Patient Details  ?Name: [name] ?MRN: [id number] ?Date of Birth: Oct 30, 1946 ? ?Today's Date: 02/26/2022 ?OT Individual Time: 1030-1130 ?OT Individual Time Calculation (min): 60 min  ? ? ?Short Term Goals: ?Week 1:  OT Short Term Goal 1 (Week 1): Pt will don pants with LRAD with no more than mod A ?OT Short Term Goal 2 (Week 1): Pt will utilize adaptive strategies to don shirt with (S) ?OT Short Term Goal 3 (Week 1): Pt will complete toilet transfer with CGA ?Week 2:    ? ?Skilled Therapeutic Interventions/Progress Updates:  ?Patient seen early this OT treatment, patient seated in recliner upon arrival, pt very depressed secondary to limited support and a desire to be more independent upon d/c.  Patient worked on UB exercise to improve strength for safe and independent transition to home.  Patient able to complete UB exercises using a 2lb dumb bell for bicep curls, shld flexion, and horizontal abduction.  The pt was able to demonstrate relaxation breathing to improve anxiety and compliance for functional task performance.  The pt was able to complete AROM of her RUE while support in splint apparatus.  Patient completed sit to stand using the RW  with MinA she was able to maintain static standing in intervals to improve her independence and safety during functional task initiation and completion.  The pt returned to her recliner with her call light in place, her bedside table near by and all additional needs addressed. The pt had no report of pain this treatment session. ? ?Therapy Documentation ?Precautions:  ?Precautions ?Precautions: Fall ?Precaution Comments: watch BP, orthostatic hypotension ?Required Braces or Orthoses: Other Brace (R UE cast) ?Other Brace: compression stockings and abdominal binder ?Restrictions ?Weight Bearing Restrictions: Yes ?RUE Weight Bearing: Weight bearing as tolerated ?Other Position/Activity Restrictions: R wrist fx and L 5th finger fx, contacting  ortho for WB status ? ?Therapy/Group: Individual Therapy ? ?Yvonne Kendall ?02/26/2022, 11:47 AM ?

## 2022-02-26 NOTE — Progress Notes (Signed)
Occupational Therapy Session Note ? ?Patient Details  ?Name: [name] ?MRN: [id number] ?Date of Birth: [birth year] ? ?Today's Date: 02/26/2022 ?OT Individual Time: 4037-5436 ?OT Individual Time Calculation (min): 27 min  ? ? ?Short Term Goals: ?Week 1:  OT Short Term Goal 1 (Week 1): Pt will don pants with LRAD with no more than mod A ?OT Short Term Goal 2 (Week 1): Pt will utilize adaptive strategies to don shirt with (S) ?OT Short Term Goal 3 (Week 1): Pt will complete toilet transfer with CGA ? ?Skilled Therapeutic Interventions/Progress Updates:  ?   ?Pt received in bed with thigh high teds on, reporting nausea but no pain. RN to deliver medicaiton for nausea, but pt requesting to attempt toileting as received laxative earlier  ? ?ADL: ?Pt completes ADL at overall MOD Level. Skilled interventions include: VC for hand placement and bed mobility to come up EOB, VC for NWB precautions when scooting forward onto bed, VC/facilitation of anterior weight shift to STS fro surfaces up to MOD A from low w/c, and MOD A for clothing management in standing after + bladder void with increased time.  ? ? ?Pt left at end of session in bed with SLP entering ? ? ?Therapy Documentation ?Precautions:  ?Precautions ?Precautions: Fall ?Precaution Comments: watch BP, orthostatic hypotension ?Required Braces or Orthoses: Other Brace (R UE cast) ?Other Brace: compression stockings and abdominal binder ?Restrictions ?Weight Bearing Restrictions: Yes ?RUE Weight Bearing: Weight bearing as tolerated ?Other Position/Activity Restrictions: R wrist fx and L 5th finger fx, contacting ortho for WB status ? ?Therapy/Group: Individual Therapy ? ?Lowella Dell Michiah Masse ?02/26/2022, 6:57 AM ?

## 2022-02-26 NOTE — Progress Notes (Addendum)
Patient ID: [name], female   DOB: 15-Apr-1946, 76 y.o.   MRN: [id number] Met with pt to give her the private duty list to hire assist and SNF list. She wanted worker to call her daughter in-law [name] to discuss with her pt's case. Pt reports she is the coordinator of the family. Will call her ? ?2:14 PM Spoke with [name]-Daughter in-law to discuss pt's options. She will talk with pt and sister in-law but requested for worker to call pt's daughter-[name] also. ?

## 2022-02-26 NOTE — Progress Notes (Signed)
Physical Therapy Session Note ? ?Patient Details  ?Name: [name] ?MRN: [id number] ?Date of Birth: 1946-02-09 ? ?Today's Date: 02/26/2022 ?PT Individual Time: 8616-8372 ?PT Individual Time Calculation (min): 69 min  ? ?Short Term Goals: ?Week 1:  PT Short Term Goal 1 (Week 1): Patient will perform bed mobility with min A consistently. ?PT Short Term Goal 1 - Progress (Week 1): Met ?PT Short Term Goal 2 (Week 1): Patient will perform basic transfers with min A consistently. ?PT Short Term Goal 2 - Progress (Week 1): Met ?PT Short Term Goal 3 (Week 1): Patient will ambulate >25 feet using LRAD. ?PT Short Term Goal 3 - Progress (Week 1): Met ?PT Short Term Goal 4 (Week 1): Patient will tolerate static standing with min A with 1-2 upper extremity support >2 min. ?PT Short Term Goal 4 - Progress (Week 1): Met ? ?Skilled Therapeutic Interventions/Progress Updates:  ?  Pt seated EOB on arrival with RN present for meds pass, THT and binder in place. Donned pants with max A to thread and pull over hips in standing. Pt required mod A to stand initially, fading to min A throughout session. ambulatory transfer to bathroom, pt with continent B+B void mod A for hygiene. ambulatory transfer to sink with PFRW and CGA, hand hygiene in standing with supervision. Pt returned to EOB to rest before leaving room. Pt ambulated x 100 ft  to/from therapy gym with PFRW and CGA. Pt then performed 3 x 5 Sit to stand without UE support for LE strength and endurance. Pt without orthostatic symptoms throughout. Pt then returned to room and requested to brush teeth, did so in standing with min A. Mild orthostatic symptoms after standing for several minutes. Pt then ambulated to recliner with PFRW and CGA, remained there and was left with all needs in reach and alarm active.  ? ?Therapy Documentation ?Precautions:  ?Precautions ?Precautions: Fall ?Precaution Comments: watch BP, orthostatic hypotension ?Required Braces or Orthoses: Other Brace (R UE  cast) ?Other Brace: compression stockings and abdominal binder ?Restrictions ?Weight Bearing Restrictions: Yes ?RUE Weight Bearing: Weight bearing as tolerated ?Other Position/Activity Restrictions: R wrist fx and L 5th finger fx, contacting ortho for WB status ? ? ? ? ?Therapy/Group: Individual Therapy ? ?[place] ?02/26/2022, 9:46 AM  ?

## 2022-02-26 NOTE — Progress Notes (Signed)
Speech Language Pathology Daily Session Note ? ?Patient Details  ?Name: [name] ?MRN: [id number] ?Date of Birth: 03-07-1946 ? ?Today's Date: 02/26/2022 ?SLP Individual Time: 1430-1500 ?SLP Individual Time Calculation (min): 30 min ? ?Short Term Goals: ?Week 2: SLP Short Term Goal 1 (Week 2): STG=LTG due to short ELOS (3/21) ? ?Skilled Therapeutic Interventions: Skilled treatment session focused on cognitive goals. Upon arrival, patient had just returned to bed and reported fatigue from previous therapy sessions. Patient independently recalled events from previous therapy sessions with extra time. Patient also recalled supports needed for blood pressure management prior to getting out of bed. Patient also reporting frustration/anxiety over current family situation regarding discharge planning. SLP provided support. Patient left supine in bed with alarm on and all needs within reach. Continue with current plan of care.  ?   ? ?Pain ?No/Denies Pain  ? ?Therapy/Group: Individual Therapy ? ?[name] ?02/26/2022, 3:29 PM ?
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
## 2022-02-26 NOTE — Progress Notes (Signed)
?                                                       PROGRESS NOTE ? ? ?Subjective/Complaints: ? ?Pt reports sitting up in bed eating breakfast- ?Little dizzy but much better- not even wearing abd binder- ?Drank 5 cups of water last night- usually drinks 2 cokes/day.  ? ?Asking about xray results. R wrist is bothering her more lately.  ? ?Labs to be drawn tomorrow.  ? ?Discussed Requip and how it can cause hypotension/dizziness- we agreed will just use at night- to help restless leg syndrome- will stop Am dose.  ? ? ? ?ROS: ? ?Pt denies SOB, abd pain, CP, N/V/C/D, and vision changes ? ? ? ? ? ? ?Objective: ?  ?DG Wrist 2 Views Right ? ?Result Date: 02/25/2022 ?CLINICAL DATA:  Right wrist fracture. EXAM: RIGHT WRIST - 2 VIEW COMPARISON:  None. FINDINGS: A distal radial fracture is noted. The fracture is displaced posteriorly. 3 cm of foreshortening present. Callus formation noted about the fracture fragments. No additional fracture is present. Other degenerative changes noted. Fiberglass cast is in place. IMPRESSION: Posteriorly displaced and foreshortened distal radial fracture with some callus formation. Electronically Signed   By: San Morelle M.D.   On: 02/25/2022 10:49   ?Recent Labs  ?  02/24/22 ?0947  ?WBC 5.5  ?HGB 10.9*  ?HCT 32.8*  ?PLT 286  ? ? ?Recent Labs  ?  02/24/22 ?0962  ?NA 135  ?K 3.7  ?CL 95*  ?CO2 27  ?GLUCOSE 114*  ?BUN 81*  ?CREATININE 2.97*  ?CALCIUM 9.8  ? ? ?Intake/Output Summary (Last 24 hours) at 02/26/2022 0809 ?Last data filed at 02/26/2022 0700 ?Gross per 24 hour  ?Intake 540 ml  ?Output --  ?Net 540 ml  ?  ? ?  ? ?Physical Exam: ?Vital Signs ?Blood pressure (!) 101/55, pulse (!) 105, temperature 98.4 ?F (36.9 ?C), temperature source Oral, resp. rate 18, height '5\' 7"'$  (1.702 m), weight 81.4 kg, SpO2 96 %. ? ? ? ? ? ? ? ? ? ?General: awake, alert, appropriate, sitting EOB eating breakfast- no abd binder; NAD ?HENT: conjugate gaze; oropharynx moist ?CV: regular rhythm, slightly  tachycardic rate; no JVD ?Pulmonary: CTA B/L; no W/R/R- good air movement ?GI: soft, NT, ND, (+)BS ?Psychiatric: appropriate ?Neurological: Ox3- much more with it- asking appropriate questions ?Extremities: no LE edema seen this AM- however feet/toes are deep red/purple- like blood pooling- warm to touch- (+) tenting- trace LE edema ?Skin: bilateral LE's/shins each with two 2" diameter wounds which are fairly superficial and with 95% granulation, clean appearing. Dressings just got changed ?Neuro:  Alert and oriented x 3. Normal insight and awareness. Intact Memory. Normal language and speech. Cranial nerve exam unremarkable. RUE limited by [facility], LUE 3-4/5. LE 3 prox to 4- distally. Decreased LT in stocking glove pattern ?Musculoskeletal: right arm in [facility], Left 4th/5th digits splinted  ? ? ? ? ? ?Assessment/Plan: ?1. Functional deficits which require 3+ hours per day of interdisciplinary therapy in a comprehensive inpatient rehab setting. ?Physiatrist is providing close team supervision and 24 hour management of active medical problems listed below. ?Physiatrist and rehab team continue to assess barriers to discharge/monitor patient progress toward functional and medical goals ? ?Care Tool: ? ?Bathing ?   ?Body parts bathed by patient:  Right lower leg, Right arm, Left arm, Left lower leg, Chest, Face, Abdomen, Front perineal area, Right upper leg, Left upper leg  ? Body parts bathed by helper: Buttocks ?  ?  ?Bathing assist Assist Level: Moderate Assistance - Patient 50 - 74% ?  ?  ?Upper Body Dressing/Undressing ?Upper body dressing   ?What is the patient wearing?: Pull over shirt ?   ?Upper body assist Assist Level: Moderate Assistance - Patient 50 - 74% ?   ?Lower Body Dressing/Undressing ?Lower body dressing ? ? ?   ?What is the patient wearing?: Pants ? ?  ? ?Lower body assist Assist for lower body dressing: Total Assistance - Patient < 25% ?   ? ?Toileting ?Toileting    ?Toileting assist Assist for toileting:  Total Assistance - Patient < 25% ?  ?  ?Transfers ?Chair/bed transfer ? ?Transfers assist ?   ? ?Chair/bed transfer assist level: Moderate Assistance - Patient 50 - 74% ?  ?  ?Locomotion ?Ambulation ? ? ?Ambulation assist ? ?   ? ?Assist level: Minimal Assistance - Patient > 75% ?Assistive device: Walker-platform ?Max distance: 10  ? ?Walk 10 feet activity ? ? ?Assist ? Walk 10 feet activity did not occur: Safety/medical concerns (decreased activity tolerance) ? ?Assist level: Minimal Assistance - Patient > 75% ?Assistive device: Walker-rolling  ? ?Walk 50 feet activity ? ? ?Assist Walk 50 feet with 2 turns activity did not occur: Safety/medical concerns ? ?  ?   ? ? ?Walk 150 feet activity ? ? ?Assist Walk 150 feet activity did not occur: Safety/medical concerns ? ?  ?  ?  ? ?Walk 10 feet on uneven surface  ?activity ? ? ?Assist Walk 10 feet on uneven surfaces activity did not occur: Safety/medical concerns ? ? ?  ?   ? ?Wheelchair ? ? ? ? ?Assist Is the patient using a wheelchair?: Yes ?Type of Wheelchair: Manual ?  ? ?Wheelchair assist level: Dependent - Patient 0% (limited by upper extremity precautions and decreased activity tolerance) ?   ? ? ?Wheelchair 50 feet with 2 turns activity ? ? ? ?Assist ? ?  ?  ? ? ?Assist Level: Dependent - Patient 0%  ? ?Wheelchair 150 feet activity  ? ? ? ?Assist ?   ? ? ?Assist Level: Dependent - Patient 0%  ? ?Blood pressure (!) 101/55, pulse (!) 105, temperature 98.4 ?F (36.9 ?C), temperature source Oral, resp. rate 18, height '5\' 7"'$  (1.702 m), weight 81.4 kg, SpO2 96 %. ? ?Medical Problem List and Plan: ?1. Functional deficits secondary to debility after congestive heart failure and multiple other med/surg issues ?            -patient may shower if LUE/cast is covered ?            -ELOS/Goals: 14-16 days, supervision goals ? D/c date3/21 ? Con't CIR- PT and OT- and added SLP eval for today- if (+), will order brain MRI.  ? Con't CIR- PT and OT- SLP will stop this week doing so  much better- - d/c 3/21 still ?2.  Antithrombotics: ?-DVT/anticoagulation:  sq heparin 5000u q8 ?            -antiplatelet therapy: ASA '81mg'$  daily ?3. Pain Management: hydrocodone 7.'5mg'$  qid ?            -prn tylenol ?            -increased scheduled gabapentin to '200mg'$  tid for neuropathic pain ?            -  cymbalta ? 3/7- pain controlled usually- however due to low BP, staff concerned about giving Norco- will d/w team ? 3/9- will decrease gabapentin to 100 mg TID due to mentation ? 3/10- no change in pain, however slightly more alert this AM- pt insistent cannot reduce Norco dosing.  ? 3/14- pt asking to increase gabapentin- explained cannot- wants R hand/wrist  xray'd- will do so ? 3/15- Xray shows displaced fx- with callus formation- will call Ortho about it-  ?4. Mood: pt relatively up beat. Team to provide ego support ?            -cymbalta ?            -antipsychotic agents: n/a ?5. Neuropsych: This patient is? capable of making decisions on her own behalf. ?6. Skin/Wound Care:   ? -xeroform, kerlix for chronic burn wounds. Wound clean/granulating ?            -pt followed at wound clinic remotely ?            -recent cultures prior to admit + for S Aureus and Pseudomonas ?            -will consult WOC RN for further recs for wound care ? 3/6- WOC suggested topping xeroform with dry gauze and silicone dressings-can use TEDs  ? 3/8- dressings just changed- C/D/I-  ?7. Fluids/Electrolytes/Nutrition: encourage appropriate po ?            -added protein supps ?            -RD f/u ?            -reviewed importance of nutrition in her recovery ?8. Acute on chronic CHF: EF 55-60%. Monitor weights daily wit strict I/O ?            -pt diuresed 7L on acute ?            -continue O2 ?            -demadex '20mg'$  daily ?            -zebeta 2.'5mg'$  daily ? 3/7- down to 1kg- however Cr up to 2.83- will call Cards due to low BP on max dose of Midodrine and Cr issues ? 3/8- Wgt down slightly to 80.8 kg from 81.1 kg- Cr down with  holding demadex x1 day and 250cc bolus- Cr better at 2.56 today and BUN 66 from 69- ?3/9- Demadex restarts today-  wil recheck labs in AM ? 3/10- will recheck BMP this AM- and also ordered qmonday- drin

## 2022-02-27 LAB — BASIC METABOLIC PANEL
Anion gap: 10 (ref 5–15)
BUN: 59 mg/dL — ABNORMAL HIGH (ref 8–23)
CO2: 27 mmol/L (ref 22–32)
Calcium: 10.2 mg/dL (ref 8.9–10.3)
Chloride: 99 mmol/L (ref 98–111)
Creatinine, Ser: 2.15 mg/dL — ABNORMAL HIGH (ref 0.44–1.00)
GFR, Estimated: 24 mL/min — ABNORMAL LOW (ref >60)
Glucose, Bld: 89 mg/dL (ref 70–99)
Potassium: 3.9 mmol/L (ref 3.5–5.1)
Sodium: 136 mmol/L (ref 135–145)

## 2022-02-27 LAB — GLUCOSE, CAPILLARY: Glucose-Capillary: 95 mg/dL (ref 70–99)

## 2022-02-27 MED ORDER — MELATONIN 5 MG PO TABS: 5.0000 mg | ORAL_TABLET | Freq: Every day | ORAL | 0 refills | Status: DC

## 2022-02-27 MED ORDER — ACETAMINOPHEN 325 MG PO TABS: 650.0000 mg | ORAL_TABLET | ORAL | Status: AC | PRN

## 2022-02-27 MED ORDER — BENZOCAINE 10 % MT GEL: Freq: Four times a day (QID) | OROMUCOSAL | 0 refills | Status: DC | PRN

## 2022-02-27 NOTE — Progress Notes (Signed)
Patient ID: [name], female   DOB: 1946/10/04, 76 y.o.   MRN: [id number]  Spoke with Kristy-daughter and pt regarding plan at discharge. Pt does not feel ready to go home with her husband since feels he will not assist her and daughter agrees with going to a SNF for a short time and then returning home. Have emailed SNF list to daughter and asked them for three choices. Will begin SNF search. ?

## 2022-02-27 NOTE — Progress Notes (Signed)
Physical Therapy Session Note ? ?Patient Details  ?Name: [name] ?MRN: [id number] ?Date of Birth: [birth year] ? ?Today's Date: 02/27/2022 ?PT Individual Time: 6378-5885 ?PT Individual Time Calculation (min): 70 min  ? ?Short Term Goals: ?Week 2:  PT Short Term Goal 1 (Week 2): =LTGs d/t ELOS ? ?Skilled Therapeutic Interventions/Progress Updates:  ?  Patient received in recliner, agreeable to PT. She denies pain, but reports irritation to R hand from splint and feeling light headed. BP reclined: 106/72. Patient with thigh high teds and abd binder on. PT providing patient with stockinette to R forearm to protect skin from brace. Gentle moisturizer applied first to assist with managing dry skin. PT applying some padding to volar surface of hand for further comfort. PT also buddy tapping L 4th and 5th fingers together per ortho notes. Patient reporting feeling as though her sugar may be low, though she does not have a history of uncontrolled blood sugar. RN assessed and it was 95. Patient requesting to use bathroom. RPFWW used to ambulate into bathroom- ModA to stand due to reduced power production in B LE and CGA for balance while ambulating. Patient with continent void. MaxA for clothing management, supervision for perihygiene. Patient ambulating back to her recliner. Chair alarm on, call light within reach.  ? ?Therapy Documentation ?Precautions:  ?Precautions ?Precautions: Fall ?Precaution Comments: watch BP, orthostatic hypotension ?Required Braces or Orthoses: Other Brace (R UE cast) ?Other Brace: compression stockings and abdominal binder ?Restrictions ?Weight Bearing Restrictions: Yes ?RUE Weight Bearing: Weight bearing as tolerated ?Other Position/Activity Restrictions: R wrist fx and L 5th finger fx, contacting ortho for WB status ? ? ? ? ?Therapy/Group: Individual Therapy ? ?Debbora Dus ?Debbora Dus, PT, DPT, CBIS ? ?02/27/2022, 7:42 AM  ?

## 2022-02-27 NOTE — Progress Notes (Signed)
Occupational Therapy Session Note ? ?Patient Details  ?Name: [name] ?MRN: [id number] ?Date of Birth: 03/06/1946 ? ?Today's Date: 02/27/2022 ?OT Individual Time: 0900-1020 ?OT Individual Time Calculation (min): 80 min  ? ? ?Short Term Goals: ?Week 1:  OT Short Term Goal 1 (Week 1): Pt will don pants with LRAD with no more than mod A ?OT Short Term Goal 1 - Progress (Week 1): Met ?OT Short Term Goal 2 (Week 1): Pt will utilize adaptive strategies to don shirt with (S) ?OT Short Term Goal 2 - Progress (Week 1): Met ?OT Short Term Goal 3 (Week 1): Pt will complete toilet transfer with CGA ?OT Short Term Goal 3 - Progress (Week 1): Met ?Week 2:  OT Short Term Goal 1 (Week 2): STG = LTG 2/2 ELOS ? ?Skilled Therapeutic Interventions/Progress Updates:  ?Patient seated in recliner upon arrival, pt completed sit to stand with Mod-MinA using the hemi RW for balance, the pt was able to donn her shirt with MinA and pants using a reacher for her reaching her LE with ModA for task completion.  The pt was able to roll herself to the sink area transferring from sit to stand at the sink area using the  hemi RW with MinA  maintaining static standing balance for 5 minutes in duration.  The pt was able to blow dry her hair with ModA for bilateral hand manipulation. She was able to brush her teeth with s/u assist.  The pt was instructed on the importance of relaxation breathing to improve compliance and to manage adverse responses.  The pt pulse was 104, BP 132/63, HR 82 and O2 saturation of 99% on room air. The pt returned to the recliner with her bedside table in place, her alarm activated , and her call light within reach.  The pt had no pain response this treatment session.   ? ?Therapy Documentation ?Precautions:  ?Precautions ?Precautions: Fall ?Precaution Comments: watch BP, orthostatic hypotension ?Required Braces or Orthoses: Other Brace (R UE cast) ?Other Brace: compression stockings and abdominal binder ?Restrictions ?Weight  Bearing Restrictions: Yes ?RUE Weight Bearing: Weight bearing as tolerated ?Other Position/Activity Restrictions: R wrist fx and L 5th finger fx, contacting ortho for WB status ? ?Therapy/Group: Individual Therapy ? ? D  ?02/27/2022, 12:23 PM ?

## 2022-02-27 NOTE — Progress Notes (Signed)
Right wrist X-rays reviewed.  Increased healing callus evident with similar degree of displacement to previous X-rays.   ?Continue with removable splint, which can be removed for sleep, shower, and ROM exercises.  Will have patient back to the office after discharge from rehab. ? ?On the other side, the affected digit should be buddy taped to the adjacent digit and working on ROM to maximize function.

## 2022-02-27 NOTE — [place] FL2 (Signed)
?[place] MEDICAID FL2 LEVEL OF CARE SCREENING TOOL  ?  ? ?IDENTIFICATION  ?Patient Name: ?[name] Birthdate: 09/20/1946 Sex: female Admission Date (Current Location): ?02/15/2022  ?[place] and [place] Number: ? [place] ?  Facility and Address:  ?[facility], [place] [address], [place], [place] [address] ?     Provider Number: ?[id number]  ?Attending Physician Name and Address:  ?Courtney Heys, MD ? Relative Name and Phone Number:  ?Enedina Finner [phone] ?   ?Current Level of Care: ?Other (Comment) (Rehab) Recommended Level of Care: ?[place] Prior Approval Number: ?  ? ?Date Approved/Denied: ?  PASRR Number: ?[id number] ? ?Discharge Plan: ?SNF ?  ? ?Current Diagnoses: ?Patient Active Problem List  ? Diagnosis Date Noted  ? Debility 02/15/2022  ? Acute kidney injury superimposed on chronic kidney disease ([place]) 02/11/2022  ? Class 1 obesity 02/10/2022  ? Vertigo 02/10/2022  ? Acute on chronic diastolic CHF (congestive heart failure) ([place])   ? Dyspnea 02/03/2022  ? Non-intractable cyclical vomiting with nausea 01/06/2022  ? 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 Chest pain on breathing 06/20/2021  ? Symptom of leg swelling 06/20/2021  ? Shortness of breath 06/20/2021  ? Attention deficit hyperactivity disorder 04/05/2021  ? Disorder of lip 04/05/2021  ? Essential hypertension 04/05/2021  ? Gout 04/05/2021  ? History of cerebrovascular accident 04/05/2021  ? Lumbar spondylosis 04/05/2021  ? Migraine 04/05/2021  ? Other allergic rhinitis 04/05/2021  ? Other long term (current) drug therapy 04/05/2021  ? Pure hypercholesterolemia 04/05/2021  ? Rotator cuff tear 04/05/2021  ? Skin sensation disturbance 04/05/2021  ? Vitamin B12 deficiency (non anemic) 04/05/2021  ? Vitamin D deficiency 04/05/2021  ? Acquired iron deficiency anemia due to decreased absorption 04/24/2020  ? Low ferritin 03/27/2020  ? Status  post gastric bypass for obesity 11/23/2019  ? Status post bariatric surgery 02/02/2018  ? Other symptoms and signs involving the nervous system 02/02/2018  ? Familial hypercholesterolemia 01/22/2018  ? Paroxysmal atrial fibrillation ([place]) 01/22/2018  ? Torticollis, acquired 10/23/2016  ? Asthma 08/04/2016  ? Other secondary pulmonary hypertension ([place]) 06/11/2016  ? 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 Chronic migraine without aura without status migrainosus, not intractable 03/19/2016  ? Chronic migraine without aura, with intractable migraine, so stated, with status migrainosus 01/22/2016  ? History of gastric bypass 12/05/2015  ? RLS (restless legs syndrome) 08/16/2015  ? Vitamin B12 deficiency anemia due to intrinsic factor deficiency 08/16/2015  ? Iron deficiency anemia 08/16/2015  ? Osteoporosis 08/16/2015  ? OSA on CPAP 08/16/2015  ? Primary osteoarthritis of left knee 04/03/2015  ? Obstructive sleep apnea 10/19/2014  ? 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 Hyperlipidemia 10/19/2014  ? Morbid obesity ([place]) 10/19/2014  ? ? ?Orientation RESPIRATION BLADDER Height & Weight   ?  ?Self, Situation, Place ? Normal Continent Weight: 176 lb 5.9 oz (80 kg) ?Height:  '5\' 7"'$  (170.2 cm)  ?BEHAVIORAL SYMPTOMS/MOOD NEUROLOGICAL BOWEL NUTRITION STATUS  ?    Continent Diet (heart healthy thin liquids)  ?AMBULATORY STATUS COMMUNICATION OF NEEDS Skin   ?Limited Assist Verbally Skin abrasions (topicla dressing changes every three days) ?  ?  ?  ?    ?     ?     ? ? ?Personal Care Assistance Level of Assistance  ?Bathing, Dressing Bathing Assistance: Limited assistance ?  ?Dressing Assistance: Limited assistance ?   ? ?Functional Limitations Info  ?Sight (wears  glasses) Sight Info: Impaired ?  ?   ? ? ?SPECIAL CARE FACTORS FREQUENCY  ?PT (By licensed PT), OT (By licensed OT)   ?  ?PT Frequency: 5x week ?OT Frequency: 5x week ?  ?  ?  ?   ? ? ?Contractures Contractures Info: Not present  ? ? ?Additional Factors Info   ?Code Status, Allergies Code Status Info: Full Code ?Allergies Info: Atorvastatin, Crestor, Ezetimibe Amitriptyline ?  ?  ?  ?   ? ?Current Medications (02/27/2022):  This is the current hospital active medication list ?Current Facility-Administered Medications  ?Medication Dose Route Frequency Provider Last Rate Last Admin  ? acetaminophen (TYLENOL) tablet 650 mg  650 mg Oral Q4H PRN Meredith Staggers, MD   650 mg at 02/27/22 1041  ? albuterol (PROVENTIL) (2.5 MG/3ML) 0.083% nebulizer solution 2.5 mg  2.5 mg Nebulization Q4H PRN Meredith Staggers, MD   2.5 mg at 02/17/22 0848  ? aspirin chewable tablet 81 mg  81 mg Oral QHS Meredith Staggers, MD   81 mg at 02/26/22 2050  ? benzocaine (ORAJEL) 10 % mucosal gel   Mouth/Throat QID PRN Meredith Staggers, MD      ? calcium carbonate (OS-CAL - dosed in mg of elemental calcium) tablet 500 mg of elemental calcium  1 tablet Oral Q breakfast Alger Simons T, MD   500 mg of elemental calcium at 02/27/22 7494  ? cephALEXin (KEFLEX) capsule 500 mg  500 mg Oral Q12H Lovorn, Megan, MD   500 mg at 02/27/22 4967  ? DULoxetine (CYMBALTA) DR capsule 60 mg  60 mg Oral BID Meredith Staggers, MD   60 mg at 02/27/22 5916  ? flavoxATE (URISPAS) tablet 100 mg  100 mg Oral TID PRN Lovorn, Jinny Blossom, MD   100 mg at 02/25/22 1309  ? gabapentin (NEURONTIN) capsule 100 mg  100 mg Oral TID Lovorn, Jinny Blossom, MD   100 mg at 02/27/22 1325  ? heparin injection 5,000 Units  5,000 Units Subcutaneous Q8H Meredith Staggers, MD   5,000 Units at 02/27/22 1326  ? HYDROcodone-acetaminophen (NORCO) 7.5-325 MG per tablet 1 tablet  1 tablet Oral QID Meredith Staggers, MD   1 tablet at 02/27/22 1326  ? meclizine (ANTIVERT) tablet 12.5 mg  12.5 mg Oral TID Alger Simons T, MD   12.5 mg at 02/27/22 1326  ? melatonin tablet 5 mg  5 mg Oral QHS Love, Pamela S, PA-C   5 mg at 02/26/22 2050  ? midodrine (PROAMATINE) tablet 15 mg  15 mg Oral TID WC Meredith Staggers, MD   15 mg at 02/27/22 1325  ? ondansetron (ZOFRAN)  tablet 4 mg  4 mg Oral Q4H PRN Lovorn, Megan, MD   4 mg at 02/26/22 1759  ? pantoprazole (PROTONIX) EC tablet 40 mg  40 mg Oral BID Meredith Staggers, MD   40 mg at 02/27/22 3846  ? polyethylene glycol (MIRALAX / GLYCOLAX) packet 17 g  17 g Oral Daily PRN Lovorn, Megan, MD      ? rOPINIRole (REQUIP) tablet 1 mg  1 mg Oral QHS Lovorn, Megan, MD   1 mg at 02/26/22 2050  ? senna (SENOKOT) tablet 8.6 mg  1 tablet Oral Daily Meredith Staggers, MD   8.6 mg at 02/27/22 6599  ? sucralfate (CARAFATE) 1 GM/10ML suspension 1 g  1 g Oral TID WC & HS Meredith Staggers, MD   1 g at 02/27/22 1325  ? torsemide (DEMADEX) tablet  20 mg  20 mg Oral Daily Bary Leriche, PA-C   20 mg at 02/27/22 1041  ? triamcinolone ointment (KENALOG) 0.1 % 1 application  1 application. Topical BID Meredith Staggers, MD   1 application. at 02/27/22 0811  ? Vitamin D (Ergocalciferol) (DRISDOL) capsule 50,000 Units  50,000 Units Oral Q7 days Meredith Staggers, MD   50,000 Units at 02/23/22 0815  ? ? ? ?Discharge Medications: ?Please see discharge summary for a list of discharge medications. ? ?Relevant Imaging Results: ? ?Relevant Lab Results: ? ? ?Additional Information ?SSN: [id number] COVID PFIZER ? ?Elease Hashimoto, LCSW ? ? ? ? ?

## 2022-02-27 NOTE — Progress Notes (Signed)
Occupational Therapy Weekly Progress Note ? ?Patient Details  ?Name: [name] ?MRN: [id number] ?Date of Birth: 1946/07/24 ? ?Beginning of progress report period: February 16, 2022 ?End of progress report period: February 27, 2022 ? ?Today's Date: 02/27/2022 ?OT Individual Time: 0900-1020 ?OT Individual Time Calculation (min): 80 min  ? ? ?Patient has met 3 of 3 short term goals. Patient able to doff LB clothing in standing/sitting with unilateral UE support on RW and able to thread BLE through LB clothing and hike over hips in standing with Mod A overall. Continues to require Max A to don thigh high TED hose and socks without AE. Patient able to don overhead clothing with S and increased time 2/2 severe neuropathy. Patient also able to complete toilet transfers with CGA.  ? ?Patient continues to demonstrate the following deficits: muscle weakness and decreased standing balance and decreased balance strategies and therefore will continue to benefit from skilled OT intervention to enhance overall performance with BADL and Reduce care partner burden. ? ?Patient progressing toward long term goals..  Continue plan of care. ? ?OT Short Term Goals ?Week 1:  OT Short Term Goal 1 (Week 1): Pt will don pants with LRAD with no more than mod A ?OT Short Term Goal 1 - Progress (Week 1): Met ?OT Short Term Goal 2 (Week 1): Pt will utilize adaptive strategies to don shirt with (S) ?OT Short Term Goal 2 - Progress (Week 1): Met ?OT Short Term Goal 3 (Week 1): Pt will complete toilet transfer with CGA ?OT Short Term Goal 3 - Progress (Week 1): Met ?Week 2:  OT Short Term Goal 1 (Week 2): STG = LTG 2/2 ELOS ? ?Skilled Therapeutic Interventions/Progress Updates:  ?Patient met lying supine in bed in agreement with OT treatment session. Mild generalized pain reported at rest. Supine to EOB with HOB elevated, increased time and use of bed rail. Patient requires cues 2/2 cognitive deficits with poor problem solving and poor STM. Completed  breakfast meal seated EOB with set-up assist. Patient with desire to complete bathing at shower level. Sit to stand from EOB with CGA. Good adherence to RUE NWB precautions. Walk-in shower transfer with Min A. Patient completed UB bathing/dressing with set-up to Min A and LB bathing/dressing with Mod A. Max A to don footwear. Patient continues to be limited by [facility] and neuropathy. Session concluded with patient seated in recliner with call bell within reach, chair alarm activated and all needs met. Dressing to be completed in subsequent OT session given time constraints.  ? ?Therapy Documentation ?Precautions:  ?Precautions ?Precautions: Fall ?Precaution Comments: watch BP, orthostatic hypotension ?Required Braces or Orthoses: Other Brace (R UE cast) ?Other Brace: compression stockings and abdominal binder ?Restrictions ?Weight Bearing Restrictions: Yes ?RUE Weight Bearing: Weight bearing as tolerated ?Other Position/Activity Restrictions: R wrist fx and L 5th finger fx, contacting ortho for WB status ?General: ?  ? ?Therapy/Group: Individual Therapy ? ?Deavon Podgorski R [place] ?02/27/2022, 6:40 AM  ?

## 2022-02-27 NOTE — Progress Notes (Signed)
?                                                       PROGRESS NOTE ? ? ?Subjective/Complaints: ? ? ?Not dizzy sitting up eaitng breakfast, but was walking to [facility] this AM- wearing Thigh TEDs and Abd binder.  ?No passing out.  ?More lightheaded actually then dizzy, and more than yesterday.  ?Nauseated yesterday- sounds like had a migraine- had to sit in dark room;  ?Nausea meds helped.  ? ? ?ROS: ? ? ?Pt denies SOB, abd pain, CP, N/V/C/D, and vision changes ? ? ? ? ? ? ?Objective: ?  ?DG Wrist 2 Views Right ? ?Result Date: 02/25/2022 ?CLINICAL DATA:  Right wrist fracture. EXAM: RIGHT WRIST - 2 VIEW COMPARISON:  None. FINDINGS: A distal radial fracture is noted. The fracture is displaced posteriorly. 3 cm of foreshortening present. Callus formation noted about the fracture fragments. No additional fracture is present. Other degenerative changes noted. Fiberglass cast is in place. IMPRESSION: Posteriorly displaced and foreshortened distal radial fracture with some callus formation. Electronically Signed   By: San Morelle M.D.   On: 02/25/2022 10:49   ?No results for input(s): WBC, HGB, HCT, PLT in the last 72 hours. ? ? ?Recent Labs  ?  02/27/22 ?0527  ?NA 136  ?K 3.9  ?CL 99  ?CO2 27  ?GLUCOSE 89  ?BUN 59*  ?CREATININE 2.15*  ?CALCIUM 10.2  ? ? ?Intake/Output Summary (Last 24 hours) at 02/27/2022 0839 ?Last data filed at 02/26/2022 1840 ?Gross per 24 hour  ?Intake 360 ml  ?Output --  ?Net 360 ml  ?  ? ?  ? ?Physical Exam: ?Vital Signs ?Blood pressure (!) 100/55, pulse 99, temperature 98.4 ?F (36.9 ?C), resp. rate 14, height '5\' 7"'$  (1.702 m), weight 80 kg, SpO2 97 %. ? ? ? ? ? ? ? ? ? ? ?General: awake, alert, appropriate, sitting EOB eating breakfast; appears comfortable; OT in room; NAD ?HENT: conjugate gaze; oropharynx moist ?CV: regular rhythm;  borderline tachycardia rate; no JVD ?Pulmonary: CTA B/L; no W/R/R- good air movement ?GI: soft, NT, ND, (+)BS ?Psychiatric: appropriate- asking to go to SNF, but  doesn't want to leave til next week ?Neurological: Ox3 ? ?Extremities: no LE edema seen this AM- however feet/toes are deep red/purple- like blood pooling- warm to touch- (+) tenting- trace LE edema ?Skin: bilateral LE's/shins each with two 2" diameter wounds which are fairly superficial and with 95% granulation, clean appearing. Dressings just got changed ?Neuro:  Alert and oriented x 3. Normal insight and awareness. Intact Memory. Normal language and speech. Cranial nerve exam unremarkable. RUE limited by [facility], LUE 3-4/5. LE 3 prox to 4- distally. Decreased LT in stocking glove pattern ?Musculoskeletal: right wrist in splint- distal radius looks shorter- (per xray is 3cm shortened), Left 4th/5th digits splinted -  ? ? ? ? ? ?Assessment/Plan: ?1. Functional deficits which require 3+ hours per day of interdisciplinary therapy in a comprehensive inpatient rehab setting. ?Physiatrist is providing close team supervision and 24 hour management of active medical problems listed below. ?Physiatrist and rehab team continue to assess barriers to discharge/monitor patient progress toward functional and medical goals ? ?Care Tool: ? ?Bathing ?   ?Body parts bathed by patient: Right lower leg, Right arm, Left arm, Left lower leg, Chest, Face, Abdomen,  Front perineal area, Right upper leg, Left upper leg  ? Body parts bathed by helper: Buttocks ?  ?  ?Bathing assist Assist Level: Moderate Assistance - Patient 50 - 74% ?  ?  ?Upper Body Dressing/Undressing ?Upper body dressing   ?What is the patient wearing?: Pull over shirt ?   ?Upper body assist Assist Level: Moderate Assistance - Patient 50 - 74% ?   ?Lower Body Dressing/Undressing ?Lower body dressing ? ? ?   ?What is the patient wearing?: Pants ? ?  ? ?Lower body assist Assist for lower body dressing: Total Assistance - Patient < 25% ?   ? ?Toileting ?Toileting    ?Toileting assist Assist for toileting: Total Assistance - Patient < 25% ?  ?  ?Transfers ?Chair/bed  transfer ? ?Transfers assist ?   ? ?Chair/bed transfer assist level: Moderate Assistance - Patient 50 - 74% ?  ?  ?Locomotion ?Ambulation ? ? ?Ambulation assist ? ?   ? ?Assist level: Minimal Assistance - Patient > 75% ?Assistive device: Walker-platform ?Max distance: 10  ? ?Walk 10 feet activity ? ? ?Assist ? Walk 10 feet activity did not occur: Safety/medical concerns (decreased activity tolerance) ? ?Assist level: Minimal Assistance - Patient > 75% ?Assistive device: Walker-rolling  ? ?Walk 50 feet activity ? ? ?Assist Walk 50 feet with 2 turns activity did not occur: Safety/medical concerns ? ?  ?   ? ? ?Walk 150 feet activity ? ? ?Assist Walk 150 feet activity did not occur: Safety/medical concerns ? ?  ?  ?  ? ?Walk 10 feet on uneven surface  ?activity ? ? ?Assist Walk 10 feet on uneven surfaces activity did not occur: Safety/medical concerns ? ? ?  ?   ? ?Wheelchair ? ? ? ? ?Assist Is the patient using a wheelchair?: Yes ?Type of Wheelchair: Manual ?  ? ?Wheelchair assist level: Dependent - Patient 0% (limited by upper extremity precautions and decreased activity tolerance) ?   ? ? ?Wheelchair 50 feet with 2 turns activity ? ? ? ?Assist ? ?  ?  ? ? ?Assist Level: Dependent - Patient 0%  ? ?Wheelchair 150 feet activity  ? ? ? ?Assist ?   ? ? ?Assist Level: Dependent - Patient 0%  ? ?Blood pressure (!) 100/55, pulse 99, temperature 98.4 ?F (36.9 ?C), resp. rate 14, height '5\' 7"'$  (1.702 m), weight 80 kg, SpO2 97 %. ? ?Medical Problem List and Plan: ?1. Functional deficits secondary to debility after congestive heart failure and multiple other med/surg issues ?            -patient may shower if LUE/cast is covered ?            -ELOS/Goals: 14-16 days, supervision goals ? D/c date3/21 ? Con't CIR- PT and OT- and added SLP eval for today- if (+), will order brain MRI.  ? D/c 3/21 ?Pt asking to go to SNF- have reached out to SW about this ?Con't CIR- PT and OT- still mod A LB dressing/ADLs ?2.   Antithrombotics: ?-DVT/anticoagulation:  sq heparin 5000u q8 ?            -antiplatelet therapy: ASA '81mg'$  daily ?3. Pain Management: hydrocodone 7.'5mg'$  qid ?            -prn tylenol ?            -increased scheduled gabapentin to '200mg'$  tid for neuropathic pain ?            -cymbalta ? 3/7-  pain controlled usually- however due to low BP, staff concerned about giving Norco- will d/w team ? 3/9- will decrease gabapentin to 100 mg TID due to mentation ? 3/10- no change in pain, however slightly more alert this AM- pt insistent cannot reduce Norco dosing.  ? 3/14- pt asking to increase gabapentin- explained cannot- wants R hand/wrist  xray'd- will do so ? 3/15- Xray shows displaced fx- with callus formation- will call Ortho about it- ?3/16- no answer yesterday- has 3cm foreshortening- now in splint- will see if their office can look at xrays to see what plan needs to be- since Dr Grandville Silos out of town  ?4. Mood: pt relatively up beat. Team to provide ego support ?            -cymbalta ?            -antipsychotic agents: n/a ?5. Neuropsych: This patient is? capable of making decisions on her own behalf. ?6. Skin/Wound Care:   ? -xeroform, kerlix for chronic burn wounds. Wound clean/granulating ?            -pt followed at wound clinic remotely ?            -recent cultures prior to admit + for S Aureus and Pseudomonas ?            -will consult WOC RN for further recs for wound care ? 3/6- WOC suggested topping xeroform with dry gauze and silicone dressings-can use TEDs  ? 3/8- dressings just changed- C/D/I-  ? 3/16- WOC consulted- changed to foam dressings and change q3 days or if soiled ?7. Fluids/Electrolytes/Nutrition: encourage appropriate po ?            -added protein supps ?            -RD f/u ?            -reviewed importance of nutrition in her recovery ?8. Acute on chronic CHF: EF 55-60%. Monitor weights daily wit strict I/O ?            -pt diuresed 7L on acute ?            -continue O2 ?            -demadex  '20mg'$  daily ?            -zebeta 2.'5mg'$  daily ? 3/7- down to 1kg- however Cr up to 2.83- will call Cards due to low BP on max dose of Midodrine and Cr issues ? 3/8- Wgt down slightly to 80.8 kg from 81.1 kg- Cr down with holding demadex x1

## 2022-02-28 NOTE — Progress Notes (Addendum)
Patient ID: [name], female   DOB: 1945-12-25, 76 y.o.   MRN: [id number]  Met with pt to discuss looking for NH and will let her know and also her daughter the offers she gets. She would like to go to [facility] since closer to daughter's home. Discussed can pursue this but they may not offer since they have to hold beds for those that live there. She reports she was told by MD she will need to manage her dizziness since she is on the max dosages of medications for this. She was moving around her room in her wheelchair and feels this might be an option for her at home to stay in a wheelchair, certainly make her less apt to fall. Will reach out to both [facility] to see if an option to go to either of them  ? ?12:13 PM Spoke with katie-admissions at [facility] who reports [place] has sent their beds to her so only [place] can take SNF patients. She has no beds currently will touch base again on MOnday ? ?1:00 PM Husband here pt asked for him to come and see her in PT session, unsure if he will stay he was complaining about getting into the hospital and not needing to see her in therapies since was not going home. Gave them the bed offers currently and will let know if anymore are offered. Husband feels if she always be at risk to fall how can she ever go home. He seems to want her fixed before leaving here. Both were asking medical questions and this worker directed both to the MD to ask them. ?

## 2022-02-28 NOTE — Progress Notes (Signed)
Patient was able to void 200 ml of urine. Post void residual bladder scan was 373 ml. Pt refuses to have a in and out cath at this time. Pt educated. Pt states " I will try to urinate again in about 45 minutes".  ?

## 2022-02-28 NOTE — Progress Notes (Signed)
Speech Language Pathology Daily Session Note ? ?Patient Details  ?Name: [name] ?MRN: [id number] ?Date of Birth: 1946-07-02 ? ?Today's Date: 02/28/2022 ?SLP Individual Time: 0902-1000 ?SLP Individual Time Calculation (min): 58 min ? ?Short Term Goals: ?Week 2: SLP Short Term Goal 1 (Week 2): STG=LTG due to short ELOS (3/21) ? ?Skilled Therapeutic Interventions: ?Pt seen for skilled ST with focus on cognitive goals, in bed in the complete dark and states she had a few moments of dizziness this AM, however agreeable to sit EOB for therapeutic tasks. SLP facilitating alternating attention exercise by providing overall Supervision A cues for accuracy. Pt mildly anxious with all cognitive tasks today stating "you're not failing me are you?" Pt continues to report desire to return home vs SNF, discussing strategies to reduce risk of falls 2' dizziness. Pt demonstrates increasing awareness of physical impairments, requires further training/education on compensation for cognitive deficits at this time. Pt left in bed with alarm set and all needs within reach. Cont ST POC.  ? ?Pain ?Pain Assessment ?Pain Scale: 0-10 ?Pain Score: 0-No pain ?Pain Type: Chronic pain ?Pain Location: Generalized ?Pain Descriptors / Indicators: Aching ?Pain Intervention(s): Medication (See eMAR) ? ?Therapy/Group: Individual Therapy ? ?Dewaine Conger ?02/28/2022, 9:47 AM ?

## 2022-02-28 NOTE — Progress Notes (Signed)
?                                                       PROGRESS NOTE ? ? ?Subjective/Complaints: ? ? ?No dizziness this Am when stood to void- thinks because wasn't wearing abd binder- explained that abd binder RAISES BP, not lowers it, so think it was a rare coincidence- not related to binder.  ?LBM this AM.  ?Was supposed to be cathed last night- but refused and finally voided it appears.  ? ?Ortho feels displacement is similar, so just f/u with him after d/c from CIR.  ? ?ROS: ? ?Pt denies SOB, abd pain, CP, N/V/C/D, and vision changes ? ? ? ? ? ? ? ?Objective: ?  ?No results found. ?No results for input(s): WBC, HGB, HCT, PLT in the last 72 hours. ? ? ?Recent Labs  ?  02/27/22 ?0527  ?NA 136  ?K 3.9  ?CL 99  ?CO2 27  ?GLUCOSE 89  ?BUN 59*  ?CREATININE 2.15*  ?CALCIUM 10.2  ? ? ?Intake/Output Summary (Last 24 hours) at 02/28/2022 0809 ?Last data filed at 02/28/2022 2536 ?Gross per 24 hour  ?Intake 838 ml  ?Output 350 ml  ?Net 488 ml  ?  ? ?  ? ?Physical Exam: ?Vital Signs ?Blood pressure (!) 109/56, pulse (!) 102, temperature 97.8 ?F (36.6 ?C), temperature source Oral, resp. rate 16, height '5\' 7"'$  (1.702 m), weight 80.1 kg, SpO2 98 %. ? ? ? ? ? ? ? ? ? ? ? ?General: awake, alert, appropriate, sitting up EOB; eating breakfast; NAD ?HENT: conjugate gaze; oropharynx moist ?CV: regular rate; no JVD ?Pulmonary: CTA B/L; no W/R/R- good air movement ?GI: soft, NT, ND, (+)BS ?Psychiatric: appropriate ?Neurological: Ox3- alert- needed multiple reinforcements to explain that dizziness will not be completely resolved ? ? ?Extremities: no LE edema seen this AM- however feet/toes are deep red/purple- like blood pooling- warm to touch- (+) tenting- trace LE edema- brace on R wrist-  ?Skin: bilateral LE's/shins each with two 2" diameter wounds which are fairly superficial and with 95% granulation, clean appearing. Pics as below ?Neuro:  Alert and oriented x 3. Normal insight and awareness. Intact Memory. Normal language and  speech. Cranial nerve exam unremarkable. RUE limited by [facility], LUE 3-4/5. LE 3 prox to 4- distally. Decreased LT in stocking glove pattern ?Musculoskeletal: right wrist in splint- distal radius looks shorter- (per xray is 3cm shortened), Left 4th/5th digits splinted -  ? ? ? ? ? ?Assessment/Plan: ?1. Functional deficits which require 3+ hours per day of interdisciplinary therapy in a comprehensive inpatient rehab setting. ?Physiatrist is providing close team supervision and 24 hour management of active medical problems listed below. ?Physiatrist and rehab team continue to assess barriers to discharge/monitor patient progress toward functional and medical goals ? ?Care Tool: ? ?Bathing ?   ?Body parts bathed by patient: Right lower leg, Right arm, Left arm, Left lower leg, Chest, Face, Abdomen, Front perineal area, Right upper leg, Left upper leg  ? Body parts bathed by helper: Buttocks ?  ?  ?Bathing assist Assist Level: Moderate Assistance - Patient 50 - 74% ?  ?  ?Upper Body Dressing/Undressing ?Upper body dressing   ?What is the patient wearing?: Pull over shirt ?   ?Upper body assist Assist Level: Moderate Assistance - Patient 50 - 74% ?   ?  Lower Body Dressing/Undressing ?Lower body dressing ? ? ?   ?What is the patient wearing?: Pants ? ?  ? ?Lower body assist Assist for lower body dressing: Total Assistance - Patient < 25% ?   ? ?Toileting ?Toileting    ?Toileting assist Assist for toileting: Total Assistance - Patient < 25% ?  ?  ?Transfers ?Chair/bed transfer ? ?Transfers assist ?   ? ?Chair/bed transfer assist level: Moderate Assistance - Patient 50 - 74% ?  ?  ?Locomotion ?Ambulation ? ? ?Ambulation assist ? ?   ? ?Assist level: Minimal Assistance - Patient > 75% ?Assistive device: Walker-platform ?Max distance: 10  ? ?Walk 10 feet activity ? ? ?Assist ? Walk 10 feet activity did not occur: Safety/medical concerns (decreased activity tolerance) ? ?Assist level: Minimal Assistance - Patient > 75% ?Assistive  device: Walker-rolling  ? ?Walk 50 feet activity ? ? ?Assist Walk 50 feet with 2 turns activity did not occur: Safety/medical concerns ? ?  ?   ? ? ?Walk 150 feet activity ? ? ?Assist Walk 150 feet activity did not occur: Safety/medical concerns ? ?  ?  ?  ? ?Walk 10 feet on uneven surface  ?activity ? ? ?Assist Walk 10 feet on uneven surfaces activity did not occur: Safety/medical concerns ? ? ?  ?   ? ?Wheelchair ? ? ? ? ?Assist Is the patient using a wheelchair?: Yes ?Type of Wheelchair: Manual ?  ? ?Wheelchair assist level: Dependent - Patient 0% (limited by upper extremity precautions and decreased activity tolerance) ?   ? ? ?Wheelchair 50 feet with 2 turns activity ? ? ? ?Assist ? ?  ?  ? ? ?Assist Level: Dependent - Patient 0%  ? ?Wheelchair 150 feet activity  ? ? ? ?Assist ?   ? ? ?Assist Level: Dependent - Patient 0%  ? ?Blood pressure (!) 109/56, pulse (!) 102, temperature 97.8 ?F (36.6 ?C), temperature source Oral, resp. rate 16, height '5\' 7"'$  (1.702 m), weight 80.1 kg, SpO2 98 %. ? ?Medical Problem List and Plan: ?1. Functional deficits secondary to debility after congestive heart failure and multiple other med/surg issues ?            -patient may shower if LUE/cast is covered ?            -ELOS/Goals: 14-16 days, supervision goals ? D/c date3/21 ? Con't CIR- PT and OT- and added SLP eval for today- if (+), will order brain MRI.  ? D/c 3/21 ?Pt asking to go to SNF- have reached out to SW about this ?Pt asking if can go home- instead of SNF- and wants me to "convince husband"-  ?Con't CIR- PT and OT ?2.  Antithrombotics: ?-DVT/anticoagulation:  sq heparin 5000u q8 ?            -antiplatelet therapy: ASA '81mg'$  daily ?3. Pain Management: hydrocodone 7.'5mg'$  qid ?            -prn tylenol ?            -increased scheduled gabapentin to '200mg'$  tid for neuropathic pain ?            -cymbalta ? 3/7- pain controlled usually- however due to low BP, staff concerned about giving Norco- will d/w team ? 3/9- will  decrease gabapentin to 100 mg TID due to mentation ? 3/10- no change in pain, however slightly more alert this AM- pt insistent cannot reduce Norco dosing.  ? 3/14- pt asking to increase gabapentin- explained  cannot- wants R hand/wrist  xray'd- will do so ? 3/15- Xray shows displaced fx- with callus formation- will call Ortho about it- ?3/16- no answer yesterday- has 3cm foreshortening- now in splint- will see if their office can look at xrays to see what plan needs to be- since Dr Grandville Silos out of town  ?3/17- Dr Grandville Silos says same displacement- f/u in clinic-  ?4. Mood: pt relatively up beat. Team to provide ego support ?            -cymbalta ?            -antipsychotic agents: n/a ?5. Neuropsych: This patient is? capable of making decisions on her own behalf. ?6. Skin/Wound Care:   ? -xeroform, kerlix for chronic burn wounds. Wound clean/granulating ?            -pt followed at wound clinic remotely ?            -recent cultures prior to admit + for S Aureus and Pseudomonas ?            -will consult WOC RN for further recs for wound care ? 3/6- WOC suggested topping xeroform with dry gauze and silicone dressings-can use TEDs  ? 3/8- dressings just changed- C/D/I-  ? 3/16- WOC consulted- changed to foam dressings and change q3 days or if soiled ?7. Fluids/Electrolytes/Nutrition: encourage appropriate po ?            -added protein supps ?            -RD f/u ?            -reviewed importance of nutrition in her recovery ?8. Acute on chronic CHF: EF 55-60%. Monitor weights daily wit strict I/O ?            -pt diuresed 7L on acute ?            -continue O2 ?            -demadex '20mg'$  daily ?            -zebeta 2.'5mg'$  daily ? 3/7- down to 1kg- however Cr up to 2.83- will call Cards due to low BP on max dose of Midodrine and Cr issues ? 3/8- Wgt down slightly to 80.8 kg from 81.1 kg- Cr down with holding demadex x1 day and 250cc bolus- Cr better at 2.56 today and BUN 66 from 69- ?3/9- Demadex restarts today-  wil  recheck labs in AM ? 3/10- will recheck BMP this AM- and also ordered qmonday- drink 6-8 cups of water/day- asked her to ?3.12- weight stable in last 3 days- however Cr up to 3.09 on Friday after she was seen- will rec

## 2022-02-28 NOTE — Progress Notes (Signed)
Occupational Therapy Session Note ? ?Patient Details  ?Name: [name] ?MRN: [id number] ?Date of Birth: 1946-05-21 ? ?Today's Date: 02/28/2022 ?OT Individual Time: 4818-5631 ?OT Individual Time Calculation (min): 60 min  ? ? ?Short Term Goals: ?Week 1:  OT Short Term Goal 1 (Week 1): Pt will don pants with LRAD with no more than mod A ?OT Short Term Goal 1 - Progress (Week 1): Met ?OT Short Term Goal 2 (Week 1): Pt will utilize adaptive strategies to don shirt with (S) ?OT Short Term Goal 2 - Progress (Week 1): Met ?OT Short Term Goal 3 (Week 1): Pt will complete toilet transfer with CGA ?OT Short Term Goal 3 - Progress (Week 1): Met ?Week 2:  OT Short Term Goal 1 (Week 2): STG = LTG 2/2 ELOS ?   ? ?Skilled Therapeutic Interventions/Progress Updates:  ?  Pt received sitting EOB with abdominal binder and thigh high TEDS on. She was already dressed and ready for the day.  Obtained coban to buddy tape her L 5th and 4th digits together.  Pt then worked on AROM of digits. ? ?Discussed her choice of going to a SNF for more rehab.  Also discussed what options she would have after that.  When she is home, if her BP continues to be a problem with standing and walking suggested she try using the wc for more mobility. Pt is walking at [facility] for a good distance but if she is alone, squat pivots from a wc may be the safest.  Pt agreeable to trialing this.   Pt did squat pivots from bed to wc, wc >< toilet with bar on her L with CGA.  Pt stated she did feel safe doing this.  Provided pt with R wc lock extension brake to allow her to use her L hand to lock chair.  Pt opted to stay in wc so she could scoot around the room.  Belt alarm on and all needs met.  ?(Later spoke with her PT who said pt is not eligible for a wc based on her walking distance).  ? ?Therapy Documentation ?Precautions:  ?Precautions ?Precautions: Fall ?Precaution Comments: watch BP, orthostatic hypotension ?Required Braces or Orthoses: Other Brace (R UE  cast) ?Other Brace: compression stockings and abdominal binder ?Restrictions ?Weight Bearing Restrictions: No ?RUE Weight Bearing: Weight bearing as tolerated ?Other Position/Activity Restrictions: R wrist fx and L 5th finger fx, contacting ortho for WB status ?  ?Vital Signs: ?Therapy Vitals ?Temp: 97.8 ?F (36.6 ?C) ?Temp Source: Oral ?Pulse Rate: (!) 102 ?Resp: 16 ?BP: (!) 109/56 ?Patient Position (if appropriate): Lying ?Oxygen Therapy ?SpO2: 98 % ?O2 Device: Room Air ?Pain: ?Pain Assessment ?Pain Scale: 0-10 ?Pain Score: 5  ?Pain Type: Chronic pain ?Pain Location: Generalized ?Pain Descriptors / Indicators: Aching ?Pain Intervention(s): Medication (See eMAR) ?ADL: ?ADL ?Eating: Supervision/safety (occasionally daughter helps) ?Where Assessed-Eating: Bed level ?Grooming: Supervision/safety ?Where Assessed-Grooming: Edge of bed ?Upper Body Bathing: Supervision/safety ?Where Assessed-Upper Body Bathing: Edge of bed ?Lower Body Bathing: Moderate assistance ?Where Assessed-Lower Body Bathing: Edge of bed ?Upper Body Dressing: Minimal assistance ?Lower Body Dressing: Maximal assistance ?Where Assessed-Lower Body Dressing: Edge of bed ?Toileting: Unable to assess ?Toilet Transfer: Minimal assistance, Moderate assistance ?Tub/Shower Transfer: Unable to assess ? ? ?Therapy/Group: Individual Therapy ? ?[place] ?02/28/2022, 8:31 AM ?

## 2022-02-28 NOTE — Progress Notes (Signed)
Physical Therapy Session Note ? ?Patient Details  ?Name: [name] ?MRN: [id number] ?Date of Birth: 07/06/1946 ? ?Today's Date: 02/28/2022 ?PT Individual Time: 1300-1410 ?PT Individual Time Calculation (min): 70 min  ? ?Short Term Goals: ?Week 1:  PT Short Term Goal 1 (Week 1): Patient will perform bed mobility with min A consistently. ?PT Short Term Goal 1 - Progress (Week 1): Met ?PT Short Term Goal 2 (Week 1): Patient will perform basic transfers with min A consistently. ?PT Short Term Goal 2 - Progress (Week 1): Met ?PT Short Term Goal 3 (Week 1): Patient will ambulate >25 feet using LRAD. ?PT Short Term Goal 3 - Progress (Week 1): Met ?PT Short Term Goal 4 (Week 1): Patient will tolerate static standing with min A with 1-2 upper extremity support >2 min. ?PT Short Term Goal 4 - Progress (Week 1): Met ?Week 2:  PT Short Term Goal 1 (Week 2): =LTGs d/t ELOS ? ?Skilled Therapeutic Interventions/Progress Updates:  ?  Pt seated in w/c on arrival and agreeable to therapy. No complaint of pain. Pt's husband present throughout session. Began session with discussion of d/c plan. When asked if he is able or willing to provide any assistance at home, pt's husband, [name], answered, "maybe." And seemed resistant to the idea of providing any care or bringing his wife home at all. Discussed that pt currently only requires CGA to min A at times and is continuing to get better, but [name] was unmoved. Continued session with pt ambulating to/from therapy gym, >300 ft at longest bout this session. Pt then instructed on and performed squat pivot w/c<>mat table x 4 with min A fading to CGA. Discussed possibility of pt being mod I at w/c level at home to prevent orthostatic dizziness. Pt then performed step ups on 3" step, 2 x 6 BIL with LUE support on hand rail. Pt with increased difficulty when stepping up with L foot. Pt then propelled w/c x 150 ft with LUE and BIL feet with supervision. Returned to room and pt remained in w/c,  requesting to finish early d/t nausea. Pt was left with all needs in reach and alarm active.  ? ?Therapist returned at later time to check in and offer another opportunity to walk. Pt politely declined mobility, but had several questions about earlier conversations with [name]. Pt is extremely frustrated with her situation and his lack of willingness to provide even minimal care. Discussed several options, including a personal care assistant for ADLs and using w/c around house to be mod I. Pt encouraged by previous session and how much she was able to do. Therapist provided education and answered questions regarding d/c as able. Pt was left with all needs in reach and alarm active.  ? ?Therapy Documentation ?Precautions:  ?Precautions ?Precautions: Fall ?Precaution Comments: watch BP, orthostatic hypotension ?Required Braces or Orthoses: Other Brace (R UE cast) ?Other Brace: compression stockings and abdominal binder ?Restrictions ?Weight Bearing Restrictions: No ?RUE Weight Bearing: Weight bearing as tolerated ?Other Position/Activity Restrictions: R wrist fx and L 5th finger fx, contacting ortho for WB status ?General: ?  ? ? ? ? ?Therapy/Group: Individual Therapy ? ? C  ?02/28/2022, 4:25 PM  ?

## 2022-03-01 LAB — BASIC METABOLIC PANEL
Anion gap: 15 (ref 5–15)
BUN: 58 mg/dL — ABNORMAL HIGH (ref 8–23)
CO2: 22 mmol/L (ref 22–32)
Calcium: 10.3 mg/dL (ref 8.9–10.3)
Chloride: 99 mmol/L (ref 98–111)
Creatinine, Ser: 2.07 mg/dL — ABNORMAL HIGH (ref 0.44–1.00)
GFR, Estimated: 25 mL/min — ABNORMAL LOW (ref >60)
Glucose, Bld: 90 mg/dL (ref 70–99)
Potassium: 4.3 mmol/L (ref 3.5–5.1)
Sodium: 136 mmol/L (ref 135–145)

## 2022-03-01 NOTE — Progress Notes (Signed)
Physical Therapy Session Note ? ?Patient Details  ?Name: [name] ?MRN: [id number] ?Date of Birth: [birth year] ? ?Today's Date: 03/01/2022 ?PT Individual Time: 4235-3614 ?PT Individual Time Calculation (min): 56 min  ? ?Short Term Goals: ?Week 2:  PT Short Term Goal 1 (Week 2): =LTGs d/t ELOS ? ?Skilled Therapeutic Interventions/Progress Updates: Pt presents in BR w/ NT.  Handed off to PT.  Pt sitting on shower chair and requesting to use toilet.  Pt required mod A for sit to stand from seat.  Pt required mod to max A for clothing management.  Pt amb x 3' to toilet w/ platform walker and min A.  Pt continent of B and B in toilet, NT to chart. Pt required Total A for pericare, c/o dizziness.  Pt required mod A for clothing management.  Pt amb x 15' to w/c w/ walker and min A, especially w/ safe approach.  Pt given washcloth for hand cleaning.  Pt performed LE there ex to increase strength.  BP monitored in sitting at 116/66 and then in standing at 74/62.  Nursing notified and meds given for pain and nausea.  Pt performed sit to stand and then SPT w/ walker w/c to bed w/ min A.  Pt returned to supine w/ min A to CGA.  Bed alarm on and all needs in reach.  Cold cloth given.  RN to chart on BP. ?   ? ?Therapy Documentation ?Precautions:  ?Precautions ?Precautions: Fall ?Precaution Comments: watch BP, orthostatic hypotension ?Required Braces or Orthoses: Other Brace (R UE cast) ?Other Brace: compression stockings and abdominal binder ?Restrictions ?Weight Bearing Restrictions: No ?RUE Weight Bearing: Weight bearing as tolerated ?Other Position/Activity Restrictions: R wrist fx and L 5th finger fx, contacting ortho for WB status ?General: ?  ?Vital Signs: ? ?Pain:Initially, no c/o pain, but at conclusion, 10/10 neuropathy pain B feet/hands ?Pain Assessment ?Pain Score: 3  ?Mobility: ?  ? ? ? ? ?Therapy/Group: Individual Therapy ? ?Ladoris Gene ?03/01/2022, 12:44 PM  ?

## 2022-03-01 NOTE — Progress Notes (Signed)
?                                                       PROGRESS NOTE ? ? ?Subjective/Complaints: ? ? ?Pt reports nauseated yesterday- pretty bad, but meds helped- thinks due to ABX.  ? ?Ate 100% breakfast which is really good for her- drinking 8 cups/water day.  ? ? ?ROS: ? ?Pt denies SOB, abd pain, CP, N/V/C/D, and vision changes ? ? ? ? ? ? ?Objective: ?  ?No results found. ?No results for input(s): WBC, HGB, HCT, PLT in the last 72 hours. ? ? ?Recent Labs  ?  02/27/22 ?4332 03/01/22 ?9518  ?NA 136 136  ?K 3.9 4.3  ?CL 99 99  ?CO2 27 22  ?GLUCOSE 89 90  ?BUN 59* 58*  ?CREATININE 2.15* 2.07*  ?CALCIUM 10.2 10.3  ? ? ?Intake/Output Summary (Last 24 hours) at 03/01/2022 1100 ?Last data filed at 03/01/2022 0740 ?Gross per 24 hour  ?Intake 117 ml  ?Output --  ?Net 117 ml  ?  ? ?  ? ?Physical Exam: ?Vital Signs ?Blood pressure 111/60, pulse (!) 103, temperature 97.9 ?F (36.6 ?C), resp. rate 14, height '5\' 7"'$  (1.702 m), weight 82 kg, SpO2 98 %. ? ? ? ? ? ? ? ? ? ? ? ? ?General: awake, alert, appropriate, sitting EOB eating breakfast- finished 100% tray; NAD ?HENT: conjugate gaze; oropharynx moist ?CV: mildly tachycardic  rate; no JVD ?Pulmonary: CTA B/L; no W/R/R- good air movement ?GI: soft, NT, ND, (+)BS ?Psychiatric: appropriate ?Neurological: Ox3- somewhat vague- chronic ?Extremities: no LE edema seen this AM- however feet/toes are deep red/purple- like blood pooling- warm to touch- (+) tenting- trace LE edema- brace on R wrist-  ?Skin: bilateral LE's/shins each with two 2" diameter wounds which are fairly superficial and with 95% granulation, clean appearing. Pics as below ?Neuro:  Alert and oriented x 3. Normal insight and awareness. Intact Memory. Normal language and speech. Cranial nerve exam unremarkable. RUE limited by [facility], LUE 3-4/5. LE 3 prox to 4- distally. Decreased LT in stocking glove pattern ?Musculoskeletal: right wrist in splint- distal radius looks shorter- (per xray is 3cm shortened), Left 4th/5th  digits splinted -  ? ? ? ? ? ?Assessment/Plan: ?1. Functional deficits which require 3+ hours per day of interdisciplinary therapy in a comprehensive inpatient rehab setting. ?Physiatrist is providing close team supervision and 24 hour management of active medical problems listed below. ?Physiatrist and rehab team continue to assess barriers to discharge/monitor patient progress toward functional and medical goals ? ?Care Tool: ? ?Bathing ?   ?Body parts bathed by patient: Right lower leg, Right arm, Left arm, Left lower leg, Chest, Face, Abdomen, Front perineal area, Right upper leg, Left upper leg  ? Body parts bathed by helper: Buttocks ?  ?  ?Bathing assist Assist Level: Moderate Assistance - Patient 50 - 74% ?  ?  ?Upper Body Dressing/Undressing ?Upper body dressing   ?What is the patient wearing?: Pull over shirt ?   ?Upper body assist Assist Level: Moderate Assistance - Patient 50 - 74% ?   ?Lower Body Dressing/Undressing ?Lower body dressing ? ? ?   ?What is the patient wearing?: Pants ? ?  ? ?Lower body assist Assist for lower body dressing: Total Assistance - Patient < 25% ?   ? ?Toileting ?  Toileting    ?Toileting assist Assist for toileting: Total Assistance - Patient < 25% ?  ?  ?Transfers ?Chair/bed transfer ? ?Transfers assist ?   ? ?Chair/bed transfer assist level: Moderate Assistance - Patient 50 - 74% ?  ?  ?Locomotion ?Ambulation ? ? ?Ambulation assist ? ?   ? ?Assist level: Minimal Assistance - Patient > 75% ?Assistive device: Walker-platform ?Max distance: 10  ? ?Walk 10 feet activity ? ? ?Assist ? Walk 10 feet activity did not occur: Safety/medical concerns (decreased activity tolerance) ? ?Assist level: Minimal Assistance - Patient > 75% ?Assistive device: Walker-rolling  ? ?Walk 50 feet activity ? ? ?Assist Walk 50 feet with 2 turns activity did not occur: Safety/medical concerns ? ?  ?   ? ? ?Walk 150 feet activity ? ? ?Assist Walk 150 feet activity did not occur: Safety/medical concerns ? ?   ?  ?  ? ?Walk 10 feet on uneven surface  ?activity ? ? ?Assist Walk 10 feet on uneven surfaces activity did not occur: Safety/medical concerns ? ? ?  ?   ? ?Wheelchair ? ? ? ? ?Assist Is the patient using a wheelchair?: Yes ?Type of Wheelchair: Manual ?  ? ?Wheelchair assist level: Dependent - Patient 0% (limited by upper extremity precautions and decreased activity tolerance) ?   ? ? ?Wheelchair 50 feet with 2 turns activity ? ? ? ?Assist ? ?  ?  ? ? ?Assist Level: Dependent - Patient 0%  ? ?Wheelchair 150 feet activity  ? ? ? ?Assist ?   ? ? ?Assist Level: Dependent - Patient 0%  ? ?Blood pressure 111/60, pulse (!) 103, temperature 97.9 ?F (36.6 ?C), resp. rate 14, height '5\' 7"'$  (1.702 m), weight 82 kg, SpO2 98 %. ? ?Medical Problem List and Plan: ?1. Functional deficits secondary to debility after congestive heart failure and multiple other med/surg issues ?            -patient may shower if LUE/cast is covered ?            -ELOS/Goals: 14-16 days, supervision goals ? D/c date3/21 ? Con't CIR- PT and OT- and added SLP eval for today- if (+), will order brain MRI.  ? D/c 3/21 ?Pt asking to go to SNF- have reached out to SW about this ?Pt asking if can go home- instead of SNF- and wants me to "convince husband"-  ?Con't CIR- PT and OT- done with SLP ?2.  Antithrombotics: ?-DVT/anticoagulation:  sq heparin 5000u q8 ?            -antiplatelet therapy: ASA '81mg'$  daily ?3. Pain Management: hydrocodone 7.'5mg'$  qid ?            -prn tylenol ?            -increased scheduled gabapentin to '200mg'$  tid for neuropathic pain ?            -cymbalta ? 3/7- pain controlled usually- however due to low BP, staff concerned about giving Norco- will d/w team ? 3/9- will decrease gabapentin to 100 mg TID due to mentation ? 3/10- no change in pain, however slightly more alert this AM- pt insistent cannot reduce Norco dosing.  ? 3/14- pt asking to increase gabapentin- explained cannot- wants R hand/wrist  xray'd- will do so ? 3/15- Xray  shows displaced fx- with callus formation- will call Ortho about it- ?3/16- no answer yesterday- has 3cm foreshortening- now in splint- will see if their office can look at  xrays to see what plan needs to be- since Dr Grandville Silos out of town  ?3/17- Dr Grandville Silos says same displacement- f/u in clinic-  ?4. Mood: pt relatively up beat. Team to provide ego support ?            -cymbalta ?            -antipsychotic agents: n/a ?5. Neuropsych: This patient is? capable of making decisions on her own behalf. ?6. Skin/Wound Care:   ? -xeroform, kerlix for chronic burn wounds. Wound clean/granulating ?            -pt followed at wound clinic remotely ?            -recent cultures prior to admit + for S Aureus and Pseudomonas ?            -will consult WOC RN for further recs for wound care ? 3/6- WOC suggested topping xeroform with dry gauze and silicone dressings-can use TEDs  ? 3/8- dressings just changed- C/D/I-  ? 3/16- WOC consulted- changed to foam dressings and change q3 days or if soiled ?7. Fluids/Electrolytes/Nutrition: encourage appropriate po ?            -added protein supps ?            -RD f/u ?            -reviewed importance of nutrition in her recovery ?8. Acute on chronic CHF: EF 55-60%. Monitor weights daily wit strict I/O ?            -pt diuresed 7L on acute ?            -continue O2 ?            -demadex '20mg'$  daily ?            -zebeta 2.'5mg'$  daily ? 3/7- down to 1kg- however Cr up to 2.83- will call Cards due to low BP on max dose of Midodrine and Cr issues ? 3/8- Wgt down slightly to 80.8 kg from 81.1 kg- Cr down with holding demadex x1 day and 250cc bolus- Cr better at 2.56 today and BUN 66 from 69- ?3/9- Demadex restarts today-  wil recheck labs in AM ? 3/10- will recheck BMP this AM- and also ordered qmonday- drink 6-8 cups of water/day- asked her to ?3.12- weight stable in last 3 days- however Cr up to 3.09 on Friday after she was seen- will recheck in AM and call Cards as required.  ?3/13- Cr down  slightly to 2.97 and BUN up to 81- getting more dry- will call Cards to discuss what can be done about Demadex dosing? ?3/14- holding demadex til Thursday- after labs-  ?3/16- Cr 2.15 and BUN down to 59- will

## 2022-03-02 LAB — URINALYSIS, ROUTINE W REFLEX MICROSCOPIC
Bacteria, UA: NONE SEEN
Bilirubin Urine: NEGATIVE
Glucose, UA: NEGATIVE mg/dL
Ketones, ur: NEGATIVE mg/dL
Leukocytes,Ua: NEGATIVE
Nitrite: NEGATIVE
Protein, ur: NEGATIVE mg/dL
Specific Gravity, Urine: 1.009 (ref 1.005–1.030)
pH: 5 (ref 5.0–8.0)

## 2022-03-02 LAB — GLUCOSE, CAPILLARY: Glucose-Capillary: 80 mg/dL (ref 70–99)

## 2022-03-02 NOTE — Progress Notes (Signed)
?                                                       PROGRESS NOTE ? ? ?Subjective/Complaints: ? ? ?Nauseated again yesterday- and hasn't eaten much since breakfast yesterday- ?Says is now emptying- no caths required.  ?However c/o new dysuria again. Burning again.  ? ?RUE hurting so bad, but understands will need f/u with Ortho after d/c.  ? ?ROS: ? ? ?Pt denies SOB, abd pain, CP, N/V/C/D, and vision changes ? ? ? ? ? ? ? ?Objective: ?  ?No results found. ?No results for input(s): WBC, HGB, HCT, PLT in the last 72 hours. ? ? ?Recent Labs  ?  03/01/22 ?0638  ?NA 136  ?K 4.3  ?CL 99  ?CO2 22  ?GLUCOSE 90  ?BUN 58*  ?CREATININE 2.07*  ?CALCIUM 10.3  ? ? ?Intake/Output Summary (Last 24 hours) at 03/02/2022 1222 ?Last data filed at 03/02/2022 1157 ?Gross per 24 hour  ?Intake 474 ml  ?Output 600 ml  ?Net -126 ml  ?  ? ?  ? ?Physical Exam: ?Vital Signs ?Blood pressure (!) 113/55, pulse (!) 106, temperature 98 ?F (36.7 ?C), resp. rate 16, height '5\' 7"'$  (1.702 m), weight 82.6 kg, SpO2 97 %. ? ? ? ? ? ? ? ? ? ? ? ? ? ?General: awake, alert, appropriate, c/o nausea- had meds; sitting up in bed; NAD ?HENT: conjugate gaze; oropharynx moist ?CV: regular rate; no JVD ?Pulmonary: CTA B/L; no W/R/R- good air movement ?GI: soft, NT, ND, (+)BS- hypoactive ?Psychiatric: appropriate ?Neurological: Ox3 ?Extremities: no LE edema seen this AM- however feet/toes are deep red/purple- like blood pooling- warm to touch- (+) tenting- trace LE edema- brace on R wrist-  ?Skin: bilateral LE's/shins each with two 2" diameter wounds which are fairly superficial and with 95% granulation, clean appearing. Pics as below ?Neuro:  Alert and oriented x 3. Normal insight and awareness. Intact Memory. Normal language and speech. Cranial nerve exam unremarkable. RUE limited by [facility], LUE 3-4/5. LE 3 prox to 4- distally. Decreased LT in stocking glove pattern ?Musculoskeletal: right wrist in splint- distal radius looks shorter- (per xray is 3cm shortened),  Left 4th/5th digits splinted -  ? ? ? ? ?Assessment/Plan: ?1. Functional deficits which require 3+ hours per day of interdisciplinary therapy in a comprehensive inpatient rehab setting. ?Physiatrist is providing close team supervision and 24 hour management of active medical problems listed below. ?Physiatrist and rehab team continue to assess barriers to discharge/monitor patient progress toward functional and medical goals ? ?Care Tool: ? ?Bathing ?   ?Body parts bathed by patient: Right lower leg, Right arm, Left arm, Left lower leg, Chest, Face, Abdomen, Front perineal area, Right upper leg, Left upper leg  ? Body parts bathed by helper: Buttocks ?  ?  ?Bathing assist Assist Level: Moderate Assistance - Patient 50 - 74% ?  ?  ?Upper Body Dressing/Undressing ?Upper body dressing   ?What is the patient wearing?: Pull over shirt ?   ?Upper body assist Assist Level: Moderate Assistance - Patient 50 - 74% ?   ?Lower Body Dressing/Undressing ?Lower body dressing ? ? ?   ?What is the patient wearing?: Pants ? ?  ? ?Lower body assist Assist for lower body dressing: Total Assistance - Patient < 25% ?   ? ?  Toileting ?Toileting    ?Toileting assist Assist for toileting: Total Assistance - Patient < 25% ?  ?  ?Transfers ?Chair/bed transfer ? ?Transfers assist ?   ? ?Chair/bed transfer assist level: Minimal Assistance - Patient > 75% ?  ?  ?Locomotion ?Ambulation ? ? ?Ambulation assist ? ?   ? ?Assist level: Minimal Assistance - Patient > 75% ?Assistive device: Walker-platform ?Max distance: 15  ? ?Walk 10 feet activity ? ? ?Assist ? Walk 10 feet activity did not occur: Safety/medical concerns (decreased activity tolerance) ? ?Assist level: Minimal Assistance - Patient > 75% ?Assistive device: Walker-platform  ? ?Walk 50 feet activity ? ? ?Assist Walk 50 feet with 2 turns activity did not occur: Safety/medical concerns ? ?  ?   ? ? ?Walk 150 feet activity ? ? ?Assist Walk 150 feet activity did not occur: Safety/medical  concerns ? ?  ?  ?  ? ?Walk 10 feet on uneven surface  ?activity ? ? ?Assist Walk 10 feet on uneven surfaces activity did not occur: Safety/medical concerns ? ? ?  ?   ? ?Wheelchair ? ? ? ? ?Assist Is the patient using a wheelchair?: Yes ?Type of Wheelchair: Manual ?  ? ?Wheelchair assist level: Dependent - Patient 0% (limited by upper extremity precautions and decreased activity tolerance) ?   ? ? ?Wheelchair 50 feet with 2 turns activity ? ? ? ?Assist ? ?  ?  ? ? ?Assist Level: Dependent - Patient 0%  ? ?Wheelchair 150 feet activity  ? ? ? ?Assist ?   ? ? ?Assist Level: Dependent - Patient 0%  ? ?Blood pressure (!) 113/55, pulse (!) 106, temperature 98 ?F (36.7 ?C), resp. rate 16, height '5\' 7"'$  (1.702 m), weight 82.6 kg, SpO2 97 %. ? ?Medical Problem List and Plan: ?1. Functional deficits secondary to debility after congestive heart failure and multiple other med/surg issues ?            -patient may shower if LUE/cast is covered ?            -ELOS/Goals: 14-16 days, supervision goals ? D/c date3/21 ? Con't CIR- PT and OT- and added SLP eval for today- if (+), will order brain MRI.  ? D/c 3/21 ?Pt asking to go to SNF- have reached out to SW about this ?Pt asking if can go home- instead of SNF- and wants me to "convince husband"-  ?Con't CIR- PT and OT- d/c 3/21 ?2.  Antithrombotics: ?-DVT/anticoagulation:  sq heparin 5000u q8 ?            -antiplatelet therapy: ASA '81mg'$  daily ?3. Pain Management: hydrocodone 7.'5mg'$  qid ?            -prn tylenol ?            -increased scheduled gabapentin to '200mg'$  tid for neuropathic pain ?            -cymbalta ? 3/7- pain controlled usually- however due to low BP, staff concerned about giving Norco- will d/w team ? 3/9- will decrease gabapentin to 100 mg TID due to mentation ? 3/10- no change in pain, however slightly more alert this AM- pt insistent cannot reduce Norco dosing.  ? 3/14- pt asking to increase gabapentin- explained cannot- wants R hand/wrist  xray'd- will do  so ? 3/15- Xray shows displaced fx- with callus formation- will call Ortho about it- ?3/16- no answer yesterday- has 3cm foreshortening- now in splint- will see if their office can look at  xrays to see what plan needs to be- since Dr Grandville Silos out of town  ?3/17- Dr Grandville Silos says same displacement- f/u in clinic-  ? 3/19- pain the same- f/u with Dr Grandville Silos ?4. Mood: pt relatively up beat. Team to provide ego support ?            -cymbalta ?            -antipsychotic agents: n/a ?5. Neuropsych: This patient is? capable of making decisions on her own behalf. ?6. Skin/Wound Care:   ? -xeroform, kerlix for chronic burn wounds. Wound clean/granulating ?            -pt followed at wound clinic remotely ?            -recent cultures prior to admit + for S Aureus and Pseudomonas ?            -will consult WOC RN for further recs for wound care ? 3/6- WOC suggested topping xeroform with dry gauze and silicone dressings-can use TEDs  ? 3/8- dressings just changed- C/D/I-  ? 3/16- WOC consulted- changed to foam dressings and change q3 days or if soiled ?7. Fluids/Electrolytes/Nutrition: encourage appropriate po ?            -added protein supps ?            -RD f/u ?            -reviewed importance of nutrition in her recovery ?8. Acute on chronic CHF: EF 55-60%. Monitor weights daily wit strict I/O ?            -pt diuresed 7L on acute ?            -continue O2 ?            -demadex '20mg'$  daily ?            -zebeta 2.'5mg'$  daily ? 3/7- down to 1kg- however Cr up to 2.83- will call Cards due to low BP on max dose of Midodrine and Cr issues ? 3/8- Wgt down slightly to 80.8 kg from 81.1 kg- Cr down with holding demadex x1 day and 250cc bolus- Cr better at 2.56 today and BUN 66 from 69- ?3/9- Demadex restarts today-  wil recheck labs in AM ? 3/10- will recheck BMP this AM- and also ordered qmonday- drink 6-8 cups of water/day- asked her to ?3.12- weight stable in last 3 days- however Cr up to 3.09 on Friday after she was seen- will  recheck in AM and call Cards as required.  ?3/13- Cr down slightly to 2.97 and BUN up to 81- getting more dry- will call Cards to discuss what can be done about Demadex dosing? ?3/14- holding demadex til Thursday- a

## 2022-03-03 DIAGNOSIS — Z87898 Personal history of other specified conditions: Secondary | ICD-10-CM

## 2022-03-03 DIAGNOSIS — G894 Chronic pain syndrome: Secondary | ICD-10-CM

## 2022-03-03 DIAGNOSIS — G629 Polyneuropathy, unspecified: Secondary | ICD-10-CM

## 2022-03-03 DIAGNOSIS — N390 Urinary tract infection, site not specified: Secondary | ICD-10-CM

## 2022-03-03 DIAGNOSIS — I951 Orthostatic hypotension: Secondary | ICD-10-CM

## 2022-03-03 LAB — CBC WITH DIFFERENTIAL/PLATELET
Abs Immature Granulocytes: 0.01 10*3/uL (ref 0.00–0.07)
Basophils Absolute: 0.1 10*3/uL (ref 0.0–0.1)
Basophils Relative: 1 %
Eosinophils Absolute: 0.5 10*3/uL (ref 0.0–0.5)
Eosinophils Relative: 8 %
HCT: 34.9 % — ABNORMAL LOW (ref 36.0–46.0)
Hemoglobin: 11.4 g/dL — ABNORMAL LOW (ref 12.0–15.0)
Immature Granulocytes: 0 %
Lymphocytes Relative: 19 %
Lymphs Abs: 1 10*3/uL (ref 0.7–4.0)
MCH: 32.8 pg (ref 26.0–34.0)
MCHC: 32.7 g/dL (ref 30.0–36.0)
MCV: 100.3 fL — ABNORMAL HIGH (ref 80.0–100.0)
Monocytes Absolute: 0.5 10*3/uL (ref 0.1–1.0)
Monocytes Relative: 9 %
Neutro Abs: 3.4 10*3/uL (ref 1.7–7.7)
Neutrophils Relative %: 63 %
Platelets: 264 10*3/uL (ref 150–400)
RBC: 3.48 MIL/uL — ABNORMAL LOW (ref 3.87–5.11)
RDW: 14.9 % (ref 11.5–15.5)
WBC: 5.4 10*3/uL (ref 4.0–10.5)
nRBC: 0 % (ref 0.0–0.2)

## 2022-03-03 LAB — COMPREHENSIVE METABOLIC PANEL
ALT: 15 U/L (ref 0–44)
AST: 22 U/L (ref 15–41)
Albumin: 2.8 g/dL — ABNORMAL LOW (ref 3.5–5.0)
Alkaline Phosphatase: 70 U/L (ref 38–126)
Anion gap: 12 (ref 5–15)
BUN: 54 mg/dL — ABNORMAL HIGH (ref 8–23)
CO2: 23 mmol/L (ref 22–32)
Calcium: 10.4 mg/dL — ABNORMAL HIGH (ref 8.9–10.3)
Chloride: 101 mmol/L (ref 98–111)
Creatinine, Ser: 2.23 mg/dL — ABNORMAL HIGH (ref 0.44–1.00)
GFR, Estimated: 23 mL/min — ABNORMAL LOW (ref >60)
Glucose, Bld: 109 mg/dL — ABNORMAL HIGH (ref 70–99)
Potassium: 3.5 mmol/L (ref 3.5–5.1)
Sodium: 136 mmol/L (ref 135–145)
Total Bilirubin: 0.5 mg/dL (ref 0.3–1.2)
Total Protein: 5.9 g/dL — ABNORMAL LOW (ref 6.5–8.1)

## 2022-03-03 LAB — SARS CORONAVIRUS 2 (TAT 6-24 HRS): SARS Coronavirus 2: NEGATIVE

## 2022-03-03 MED ORDER — TRAMADOL HCL 50 MG PO TABS: 50.0000 mg | ORAL_TABLET | Freq: Two times a day (BID) | ORAL | Status: DC | PRN

## 2022-03-03 MED ORDER — ROPINIROLE HCL 1 MG PO TABS: 1.0000 mg | ORAL_TABLET | Freq: Every day | ORAL | Status: DC

## 2022-03-03 MED ORDER — FLAVOXATE HCL 100 MG PO TABS: 100.0000 mg | ORAL_TABLET | Freq: Three times a day (TID) | ORAL | 0 refills | Status: DC | PRN

## 2022-03-03 MED ORDER — MECLIZINE HCL 12.5 MG PO TABS: 12.5000 mg | ORAL_TABLET | Freq: Three times a day (TID) | ORAL | 0 refills | Status: DC

## 2022-03-03 MED ORDER — GABAPENTIN 100 MG PO CAPS: 100.0000 mg | ORAL_CAPSULE | Freq: Three times a day (TID) | ORAL | Status: DC

## 2022-03-03 MED ORDER — HYDROCODONE-ACETAMINOPHEN 7.5-325 MG PO TABS: 1.0000 | ORAL_TABLET | Freq: Four times a day (QID) | ORAL | 0 refills | Status: DC

## 2022-03-03 NOTE — Progress Notes (Signed)
Occupational Therapy Session Note ? ?Patient Details  ?Name: [name] ?MRN: [id number] ?Date of Birth: [birth year] ? ?Today's Date: 03/03/2022 ?OT Individual Time: 8185-9093 ?OT Individual Time Calculation (min): 71 min  ? ? ?Short Term Goals: ?Week 1:  OT Short Term Goal 1 (Week 1): Pt will don pants with LRAD with no more than mod A ?OT Short Term Goal 1 - Progress (Week 1): Met ?OT Short Term Goal 2 (Week 1): Pt will utilize adaptive strategies to don shirt with (S) ?OT Short Term Goal 2 - Progress (Week 1): Met ?OT Short Term Goal 3 (Week 1): Pt will complete toilet transfer with CGA ?OT Short Term Goal 3 - Progress (Week 1): Met ?Week 2:  OT Short Term Goal 1 (Week 2): STG = LTG 2/2 ELOS ? ?Skilled Therapeutic Interventions/Progress Updates:  ?Patient met lying supine in bed in agreement with OT treatment session. Mild pain reported at R shoulder. Education provided on positioning RUE while in bed at rest to decrease discomfort. Session with focus on ADLs, ADL transfers, techniques to reduce dizziness and functional mobility with PFRW. Orthostatic vitals assessed with BP 115/50 in supine, 104/57 seated EOB improving to 113/59 with abdominal binder and 1 set x20 reps each of ankle pumps, and 108/59 in standing. Supine to EOB with supervision A and increased time. With EOB positioned in lowest setting patient required Mod A for sit to stand to [place]. Requires Min A for sit to stand transfers from elevated surfaces with cues for anterior lean. Walk-in shower transfer with CGA. Patient completed UB bathing/dressing with set-up assist and LB bathing/dressing with Min A AE including reacher, sock-aid and shoe horn. Continues to require Max A tod on thigh-high TED hose. Session concluded with patient seated in wc with call bell within reach, chair alarm activated and all needs met.  ? ?Therapy Documentation ?Precautions:  ?Precautions ?Precautions: Fall ?Precaution Comments: watch BP, orthostatic hypotension ?Required  Braces or Orthoses: Other Brace (R UE cast) ?Other Brace: compression stockings and abdominal binder ?Restrictions ?Weight Bearing Restrictions: No ?RUE Weight Bearing: Weight bearing as tolerated ?Other Position/Activity Restrictions: R wrist fx and L 5th finger fx, contacting ortho for WB status ?General: ?  ?Therapy/Group: Individual Therapy ? ?Starsky Nanna R Howerton-Davis ?03/03/2022, 6:56 AM ?
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
## 2022-03-03 NOTE — Progress Notes (Signed)
Inpatient Rehabilitation Discharge Medication Review by a Pharmacist ? ?A complete drug regimen review was completed for this patient to identify any potential clinically significant medication issues. ? ?High Risk Drug Classes Is patient taking? Indication by Medication  ?Antipsychotic No   ?Anticoagulant No   ?Antibiotic No   ?Opioid Yes Vicodin for pain  ?Antiplatelet No   ?Hypoglycemics/insulin No   ?Vasoactive Medication Yes Torsemide for fluid ?Midodrine for BP  ?Chemotherapy No   ?Other Yes Cymbalta for mood, pain ?Gabapentin for pain ?Meclizine for vertigo ?Protonix, Carafate for GERD ?Ropinirole for RLS  ? ? ? ?Type of Medication Issue Identified Description of Issue Recommendation(s)  ?Drug Interaction(s) (clinically significant) ?    ?Duplicate Therapy ?    ?Allergy ?    ?No Medication Administration End Date ?    ?Incorrect Dose ?    ?Additional Drug Therapy Needed ?    ?Significant med changes from prior encounter (inform family/care partners about these prior to discharge).    ?Other ?    ? ? ?Clinically significant medication issues were identified that warrant physician communication and completion of prescribed/recommended actions by midnight of the next day:  No ? ?Pharmacist comments: None ? ?Time spent performing this drug regimen review (minutes):  20 minutes ? ? ?Tad Moore ?03/03/2022 9:54 AM ?

## 2022-03-03 NOTE — Progress Notes (Signed)
Speech Language Pathology Discharge Summary ? ?Patient Details  ?Name: [name] ?MRN: [id number] ?Date of Birth: March 17, 1946 ? ?Today's Date: 03/03/2022 ?SLP Individual Time: 2035-5974 ?SLP Individual Time Calculation (min): 47 min ? ? ?Skilled Therapeutic Interventions:  Skilled ST services focused on education and cognitive skills. SLP facilitated reassessment of cognitive linguistic skills, in which pt scored WFL on Cognistat. SLP educated pt on self-advocacy medical deficits, low pressure and frequent UTIs. Memory handout given. Pt demonstrated great anticipatory awareness pertaining to medical issues and strong desire to return home after short stay at SNF. All questions answered to satisfaction. Pt was left in room with call bell within reach and chair alarm set. SLP recommends to continue skilled services ? ? ? ?Patient has met 5 of 5 long term goals.  Patient to discharge at overall Modified Independent level.  ?Reasons goals not met:    ? ?Clinical Impression/Discharge Summary:    Pt made great progress meeting 5 out 5 goals, discharging at mod I. Pt demonstrated acute changes in cognitive skills due to UTI, however cognition now is at baseline after infection cleared along with targeted cognitive treatment. SLP facilitated functional mildly complex problem solving skills (medication and time management), sustained-alternating attention, emergent-anticipatory awareness and short term recall skills. Pt scored WFL on all subsections of Cognistat, cognitive linguistic formal assessment, in which she demonstrated mild-moderate deficit at time of evaluation. Education was completed with pt, handout given on memory strategies, and encouraging advocating for health issues including frequent UTIs and managing low blood pressure. Pt benefited from skilled ST services in order to maximize functional independence and reduce burden of care,no further ST services are needed at this time.  ? ?Care Partner:  ?Caregiver  Able to Provide Assistance: Yes  ?Type of Caregiver Assistance: Physical;Cognitive ? ?Recommendation:  ?None  ?   ? ?Equipment: N/A  ? ?Reasons for discharge: Discharged from hospital  ? ?Patient/Family Agrees with Progress Made and Goals Achieved: Yes  ? ? ?Arshia Spellman ?03/03/2022, 12:19 PM ? ?

## 2022-03-03 NOTE — Plan of Care (Signed)
?  Problem: Consults ?Goal: RH GENERAL PATIENT EDUCATION ?Description: See Patient Education module for education specifics. ?Outcome: Progressing ?  ?Problem: RH SKIN INTEGRITY ?Goal: RH STG MAINTAIN SKIN INTEGRITY WITH ASSISTANCE ?Description: STG Maintain Skin Integrity With min Assistance. ?Outcome: Progressing ?Goal: RH STG ABLE TO PERFORM INCISION/WOUND CARE W/ASSISTANCE ?Description: STG Able To Perform Incision/Wound Care With min Assistance. ?Outcome: Progressing ?  ?Problem: RH SAFETY ?Goal: RH STG ADHERE TO SAFETY PRECAUTIONS W/ASSISTANCE/DEVICE ?Description: STG Adhere to Safety Precautions With min Assistance/Device. ?Outcome: Progressing ?  ?Problem: RH KNOWLEDGE DEFICIT GENERAL ?Goal: RH STG INCREASE KNOWLEDGE OF SELF CARE AFTER HOSPITALIZATION ?Description: Patient will demonstrate knowledge of self-care, medication management, skin/wound care, BP management with educational materials and handouts provided by staff independently at discharge. ?Outcome: Progressing ?  ?
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
## 2022-03-03 NOTE — Progress Notes (Signed)
Physical Therapy Discharge Summary ? ?Patient Details  ?Name: [name] ?MRN: [id number] ?Date of Birth: 03-26-1946 ? ?Today's Date: 03/03/2022 ?PT Individual Time: 5631-4970 ?PT Individual Time Calculation (min): 89 min  ? ? ?Patient has met 9 of 9 long term goals due to improved activity tolerance, improved balance, improved postural control, increased strength, ability to compensate for deficits, and improved coordination.  Patient to discharge at an ambulatory level Ko [place].   Patient's care partner  unwilling  to provide the necessary physical assistance at discharge. Pt able to stand and ambulate up to 300 ft with PFRW with CGA. Min A squat pivots to enable pt to be mod I at w/c level. Pt's husband is unwilling to provide any care at home, so pt to d/c to SNF for continued care.  ? ?Reasons goals not met: N/A ? ?Recommendation:  ?Patient will benefit from ongoing skilled PT services in home health setting to continue to advance safe functional mobility, address ongoing impairments in strength, balance, orthostatic symptoms, and minimize fall risk. Pt's husband has opted not to provide care at home so pt to d/c to SNF for continued care. ? ?Equipment: ?No equipment provided ? ?Reasons for discharge: treatment goals met and discharge from hospital ? ?Patient/family agrees with progress made and goals achieved: Yes ? ?Skilled Therapeutic Interventions/Progress Updates:  ?Pt seated in w/c on arrival and agreeable to therapy. No complaint of pain. Session focused on d/c assessment, including motor and sensory testing, and berg balance score as listed below. Pt provided and instructed on HEP, including setting up HEP2GO app on pt's phone and demonstrating it's use. Pt then transported to gym for time and performed car transfer with min A for LE management. Pt with many questions about d/c and expectations for SNF, which therapist answered to the best of their ability. Pt requested to remain in w/c at end of  session and was left with all needs in reach and alarm active.  ? ?PT Discharge ?Precautions/Restrictions ?Precautions ?Precautions: Fall ?Precaution Comments: watch BP, orthostatic hypotension, pt able to recall strategies (binder and teds, avoiding static standing if possible) ?Required Braces or Orthoses: Other Brace (RUE removable cast) ?Restrictions ?Weight Bearing Restrictions: Yes ?RUE Weight Bearing: Non weight bearing ?Other Position/Activity Restrictions: R wrist fx and L 5th finger fx, contacting ortho for WB status ?Vital Signs ?Therapy Vitals ?Temp: 97.9 ?F (36.6 ?C) ?Temp Source: Oral ?Pulse Rate: 98 ?Resp: 15 ?BP: 125/72 ?Patient Position (if appropriate): Sitting ?Oxygen Therapy ?SpO2: 100 % ?O2 Device: Room Air ?Pain ?Pain Assessment ?Pain Score: 0-No pain ?Pain Interference ?Pain Interference ?Pain Effect on Sleep: 2. Occasionally ?Pain Interference with Therapy Activities: 1. Rarely or not at all ?Pain Interference with Day-to-Day Activities: 1. Rarely or not at all ?Vision/Perception  ?Vision - History ?Ability to See in Adequate Light: 1 Impaired ?Vision - Assessment ?Eye Alignment: Within Functional Limits ?Ocular Range of Motion: Within Functional Limits ?Alignment/Gaze Preference: Within Defined Limits ?Tracking/Visual Pursuits: Able to track stimulus in all quads without difficulty ?Saccades: Within functional limits ?Perception ?Perception: Within Functional Limits ?Praxis ?Praxis: Intact  ?Cognition ?Overall Cognitive Status: Within Functional Limits for tasks assessed ?Arousal/Alertness: Awake/alert ?Orientation Level: Oriented X4 ?Year: 2023 ?Month: March ?Day of Week: Correct ?Attention: Sustained;Selective ?Sustained Attention: Appears intact ?Selective Attention: Appears intact ?Memory: Appears intact ?Memory Impairment: Decreased recall of new information (Improved from baseline) ?Awareness: Appears intact ?Problem Solving: Appears intact ?Safety/Judgment: Appears  intact ?Sensation ?Sensation ?Central sensation comments: Numbness and tingling BLE and BUE, hx of  neuropathy ?Light Touch Impaired Details: Impaired RLE;Impaired LLE ?Hot/Cold: Impaired by gross assessment ?Proprioception: Appears Intact ?Coordination ?Gross Motor Movements are Fluid and Coordinated: No ?Fine Motor Movements are Fluid and Coordinated: No ?Coordination and Movement Description: Decreased postural control and generalized deconditioning, limited by dizziness and orthostasis ?Motor  ?Motor ?Motor: Other (comment);Abnormal postural alignment and control ?Motor - Skilled Clinical Observations: generalized deconditioning  ?Mobility ?Bed Mobility ?Bed Mobility: Sit to Supine;Supine to Sit;Rolling Right;Rolling Left ?Rolling Right: Independent with assistive device ?Rolling Left: Independent with assistive device ?Supine to Sit: Supervision/Verbal cueing ?Sit to Supine: Supervision/Verbal cueing ?Transfers ?Sit to Stand: Contact Guard/Touching assist ?Stand to Sit: Contact Guard/Touching assist ?Stand Pivot Transfers: Contact Guard/Touching assist ?Transfer (Assistive device): Right platform walker ?Locomotion  ?Gait ?Ambulation: Yes ?Gait Assistance: Contact Guard/Touching assist ?Gait Distance (Feet): 300 Feet ?Assistive device: Right platform walker ?Gait ?Gait: Yes ?Gait Pattern: Decreased hip/knee flexion - left;Decreased hip/knee flexion - right;Decreased stride length;Decreased dorsiflexion - right;Decreased dorsiflexion - left;Shuffle;Trunk flexed ?Stairs / Additional Locomotion ?Stairs Assistance: Contact Guard/Touching assist ?Number of Stairs: 8 ?Height of Stairs: 3 ?Ramp: Contact Guard/touching assist ?Pick up small object from the floor assist level: Maximal Assistance - Patient 25 - 49% ?Wheelchair Mobility ?Wheelchair Mobility: Yes ?Wheelchair Assistance: Supervision/Verbal cueing ?Wheelchair Propulsion: Left upper extremity;Both lower extermities ?Wheelchair Parts Management: Needs  assistance ?Distance: 100 ft  ?Trunk/Postural Assessment  ?Cervical Assessment ?Cervical Assessment: Within Functional Limits ?Thoracic Assessment ?Thoracic Assessment: Within Functional Limits ?Lumbar Assessment ?Lumbar Assessment: Within Functional Limits ?Postural Control ?Postural Control: Deficits on evaluation ?Righting Reactions: delayed righting reactions  ?Balance ?Balance ?Balance Assessed: Yes ?Standardized Balance Assessment ?Standardized Balance Assessment: Merrilee Jansky Balance Test ?Merrilee Jansky Balance Test ?Sit to Stand: Able to stand  independently using hands ?Standing Unsupported: Able to stand 2 minutes with supervision ?Sitting with Back Unsupported but Feet Supported on Floor or Stool: Able to sit safely and securely 2 minutes ?Stand to Sit: Sits independently, has uncontrolled descent ?Transfers: Needs one person to assist ?Standing Unsupported with Eyes Closed: Able to stand 10 seconds with supervision ?Standing Ubsupported with Feet Together: Needs help to attain position and unable to hold for 15 seconds ?From Standing, Reach Forward with Outstretched Arm: Loses balance while trying/requires external support ?From Standing Position, Pick up Object from Floor: Unable to try/needs assist to keep balance ?From Standing Position, Turn to Look Behind Over each Shoulder: Needs assist to keep from losing balance and falling ?Turn 360 Degrees: Needs assistance while turning ?Standing Unsupported, Alternately Place Feet on Step/Stool: Needs assistance to keep from falling or unable to try ?Standing Unsupported, One Foot in Front: Loses balance while stepping or standing ?Standing on One Leg: Unable to try or needs assist to prevent fall ?Total Score: 15 ?Static Sitting Balance ?Static Sitting - Balance Support: Feet supported ?Static Sitting - Level of Assistance: 7: Independent ?Dynamic Sitting Balance ?Dynamic Sitting - Balance Support: Feet supported ?Dynamic Sitting - Level of Assistance: 6: Modified  independent (Device/Increase time) ?Static Standing Balance ?Static Standing - Balance Support: During functional activity;Bilateral upper extremity supported ?Static Standing - Level of Assistance: 5: Stand by assistance ?Dynamic Standing [employer]

## 2022-03-03 NOTE — Progress Notes (Addendum)
Patient ID: Dominiqua J Hevener, female   DOB: 10/11/1946, 75 y.o.   MRN: [id number]  message from daughter they have have chosen [facility] spoke with Janey-Admissions Coordinator who will have a bed for pt tomorrow. Checking with MD regarding medical readiness for transfer tomorrow and asked Pam-PA to order COVID test. Work toward transfer tomorrow ? ?9:16 AM Spoke with husband [facility] wants him there tomorrow at 10;00 to sign papers and will work toward getting pt transferred at 11:00. ? ?11:25 Am Met with pt who will try but not looking forward to going there she would rather go home, but knows how her husband feels. She will try it and see how she does in the therapies. Discussed setting up non-emergency ambulance for 11:00 pick up, she is aware her husband will be meeting with them at 10:00. Work toward transfer tomorrow. PTAR scheduled for pick up at 11:00 ?

## 2022-03-03 NOTE — Progress Notes (Signed)
Inpatient Rehabilitation Care Coordinator ?Discharge Note  ? ?Patient Details  ?Name: [name] ?MRN: [id number] ?Date of Birth: [birth year] ? ? ?Discharge location: [facility] ? ?Length of Stay: 17 DAYS ? ?Discharge activity level: MIN LEVEL OF ASSIST ? ?Home/community participation: ACTIVE ? ?Patient response OE:HOZYYQ Literacy - How often do you need to have someone help you when you read instructions, pamphlets, or other written material from your doctor or pharmacy?: Never ? ?Patient response MG:NOIBBC Isolation - How often do you feel lonely or isolated from those around you?: Never ? ?Services provided included: MD, RD, PT, OT, RN, CM, TR, Pharmacy, SW ? ?Financial Services:  ?Charity fundraiser Utilized: Medicare ?  ? ?Choices offered to/list presented to: PT, HUSBAND AND DAUGHTER ? ?Follow-up services arranged:  ?Other (Comment) (SNF) ?   ?  ?  ?  ? ?Patient response to transportation need: ?Is the patient able to respond to transportation needs?: Yes ?In the past 12 months, has lack of transportation kept you from medical appointments or from getting medications?: No ?In the past 12 months, has lack of transportation kept you from meetings, work, or from getting things needed for daily living?: No ? ? ? ?Comments (or additional information): HUSBAND FELT NOT AT A LEVEL HE CAN MANAGE HER CARE NEEDS AT THIS TIME, WANT HER TO HAVE MORE REHAB AND THEN RETURN HOME. HUSBAND HAS HEALTH ISSUES OF HIS OWN. GAVE BEDSIDE RN NUMBER FOR REPORT [phone] ROOM [phone] ? ?Patient/Family verbalized understanding of follow-up arrangements:  Yes ? ?Individual responsible for coordination of the follow-up plan: GARY-HUSBAND [phone] ? ?Confirmed correct DME delivered: Elease Hashimoto 03/03/2022   ? ?Elease Hashimoto ?

## 2022-03-04 ENCOUNTER — Ambulatory Visit: Payer: Medicare Other | Admitting: Neurology

## 2022-03-04 DIAGNOSIS — S62109A Fracture of unspecified carpal bone, unspecified wrist, initial encounter for closed fracture: Secondary | ICD-10-CM | POA: Diagnosis not present

## 2022-03-04 DIAGNOSIS — R5381 Other malaise: Secondary | ICD-10-CM | POA: Diagnosis not present

## 2022-03-04 DIAGNOSIS — F32A Depression, unspecified: Secondary | ICD-10-CM | POA: Diagnosis not present

## 2022-03-04 DIAGNOSIS — R350 Frequency of micturition: Secondary | ICD-10-CM | POA: Diagnosis not present

## 2022-03-04 DIAGNOSIS — K219 Gastro-esophageal reflux disease without esophagitis: Secondary | ICD-10-CM | POA: Diagnosis not present

## 2022-03-04 DIAGNOSIS — R3914 Feeling of incomplete bladder emptying: Secondary | ICD-10-CM | POA: Diagnosis not present

## 2022-03-04 DIAGNOSIS — S52501B Unspecified fracture of the lower end of right radius, initial encounter for open fracture type I or II: Secondary | ICD-10-CM | POA: Diagnosis not present

## 2022-03-04 DIAGNOSIS — L98499 Non-pressure chronic ulcer of skin of other sites with unspecified severity: Secondary | ICD-10-CM | POA: Diagnosis not present

## 2022-03-04 DIAGNOSIS — Z7401 Bed confinement status: Secondary | ICD-10-CM | POA: Diagnosis not present

## 2022-03-04 DIAGNOSIS — G2581 Restless legs syndrome: Secondary | ICD-10-CM | POA: Diagnosis not present

## 2022-03-04 DIAGNOSIS — M109 Gout, unspecified: Secondary | ICD-10-CM | POA: Diagnosis not present

## 2022-03-04 DIAGNOSIS — R252 Cramp and spasm: Secondary | ICD-10-CM | POA: Diagnosis not present

## 2022-03-04 DIAGNOSIS — M25531 Pain in right wrist: Secondary | ICD-10-CM | POA: Diagnosis not present

## 2022-03-04 DIAGNOSIS — N179 Acute kidney failure, unspecified: Secondary | ICD-10-CM | POA: Diagnosis not present

## 2022-03-04 DIAGNOSIS — G4733 Obstructive sleep apnea (adult) (pediatric): Secondary | ICD-10-CM | POA: Diagnosis not present

## 2022-03-04 DIAGNOSIS — L97819 Non-pressure chronic ulcer of other part of right lower leg with unspecified severity: Secondary | ICD-10-CM | POA: Diagnosis not present

## 2022-03-04 DIAGNOSIS — G4700 Insomnia, unspecified: Secondary | ICD-10-CM | POA: Diagnosis not present

## 2022-03-04 DIAGNOSIS — E1142 Type 2 diabetes mellitus with diabetic polyneuropathy: Secondary | ICD-10-CM | POA: Diagnosis not present

## 2022-03-04 DIAGNOSIS — I5030 Unspecified diastolic (congestive) heart failure: Secondary | ICD-10-CM | POA: Diagnosis not present

## 2022-03-04 DIAGNOSIS — I359 Nonrheumatic aortic valve disorder, unspecified: Secondary | ICD-10-CM | POA: Diagnosis not present

## 2022-03-04 DIAGNOSIS — Z022 Encounter for examination for admission to residential institution: Secondary | ICD-10-CM | POA: Diagnosis not present

## 2022-03-04 DIAGNOSIS — I959 Hypotension, unspecified: Secondary | ICD-10-CM | POA: Diagnosis not present

## 2022-03-04 DIAGNOSIS — Z8673 Personal history of transient ischemic attack (TIA), and cerebral infarction without residual deficits: Secondary | ICD-10-CM | POA: Diagnosis not present

## 2022-03-04 DIAGNOSIS — W19XXXA Unspecified fall, initial encounter: Secondary | ICD-10-CM | POA: Diagnosis not present

## 2022-03-04 DIAGNOSIS — J45909 Unspecified asthma, uncomplicated: Secondary | ICD-10-CM | POA: Diagnosis not present

## 2022-03-04 DIAGNOSIS — I951 Orthostatic hypotension: Secondary | ICD-10-CM | POA: Diagnosis not present

## 2022-03-04 DIAGNOSIS — R000 Tachycardia, unspecified: Secondary | ICD-10-CM | POA: Diagnosis not present

## 2022-03-04 DIAGNOSIS — L97429 Non-pressure chronic ulcer of left heel and midfoot with unspecified severity: Secondary | ICD-10-CM | POA: Diagnosis not present

## 2022-03-04 DIAGNOSIS — S52571P Other intraarticular fracture of lower end of right radius, subsequent encounter for closed fracture with malunion: Secondary | ICD-10-CM | POA: Diagnosis not present

## 2022-03-04 DIAGNOSIS — R3129 Other microscopic hematuria: Secondary | ICD-10-CM

## 2022-03-04 DIAGNOSIS — E1122 Type 2 diabetes mellitus with diabetic chronic kidney disease: Secondary | ICD-10-CM | POA: Diagnosis not present

## 2022-03-04 DIAGNOSIS — G609 Hereditary and idiopathic neuropathy, unspecified: Secondary | ICD-10-CM | POA: Diagnosis not present

## 2022-03-04 DIAGNOSIS — M1990 Unspecified osteoarthritis, unspecified site: Secondary | ICD-10-CM | POA: Diagnosis not present

## 2022-03-04 DIAGNOSIS — D649 Anemia, unspecified: Secondary | ICD-10-CM | POA: Diagnosis not present

## 2022-03-04 DIAGNOSIS — I480 Paroxysmal atrial fibrillation: Secondary | ICD-10-CM | POA: Diagnosis not present

## 2022-03-04 DIAGNOSIS — Z9884 Bariatric surgery status: Secondary | ICD-10-CM | POA: Diagnosis not present

## 2022-03-04 DIAGNOSIS — R42 Dizziness and giddiness: Secondary | ICD-10-CM | POA: Diagnosis not present

## 2022-03-04 DIAGNOSIS — G894 Chronic pain syndrome: Secondary | ICD-10-CM | POA: Diagnosis not present

## 2022-03-04 DIAGNOSIS — I272 Pulmonary hypertension, unspecified: Secondary | ICD-10-CM | POA: Diagnosis not present

## 2022-03-04 DIAGNOSIS — E559 Vitamin D deficiency, unspecified: Secondary | ICD-10-CM | POA: Diagnosis not present

## 2022-03-04 DIAGNOSIS — G629 Polyneuropathy, unspecified: Secondary | ICD-10-CM | POA: Diagnosis not present

## 2022-03-04 DIAGNOSIS — I5032 Chronic diastolic (congestive) heart failure: Secondary | ICD-10-CM | POA: Diagnosis not present

## 2022-03-04 DIAGNOSIS — I639 Cerebral infarction, unspecified: Secondary | ICD-10-CM | POA: Diagnosis not present

## 2022-03-04 DIAGNOSIS — Z8249 Family history of ischemic heart disease and other diseases of the circulatory system: Secondary | ICD-10-CM | POA: Diagnosis not present

## 2022-03-04 DIAGNOSIS — S62617D Displaced fracture of proximal phalanx of left little finger, subsequent encounter for fracture with routine healing: Secondary | ICD-10-CM | POA: Diagnosis not present

## 2022-03-04 DIAGNOSIS — I482 Chronic atrial fibrillation, unspecified: Secondary | ICD-10-CM | POA: Diagnosis not present

## 2022-03-04 DIAGNOSIS — Z9889 Other specified postprocedural states: Secondary | ICD-10-CM | POA: Diagnosis not present

## 2022-03-04 DIAGNOSIS — I509 Heart failure, unspecified: Secondary | ICD-10-CM | POA: Diagnosis not present

## 2022-03-04 DIAGNOSIS — R3912 Poor urinary stream: Secondary | ICD-10-CM | POA: Diagnosis not present

## 2022-03-04 DIAGNOSIS — N183 Chronic kidney disease, stage 3 unspecified: Secondary | ICD-10-CM | POA: Diagnosis not present

## 2022-03-04 DIAGNOSIS — I5022 Chronic systolic (congestive) heart failure: Secondary | ICD-10-CM | POA: Diagnosis not present

## 2022-03-04 MED ORDER — TORSEMIDE 20 MG PO TABS: 20.0000 mg | ORAL_TABLET | ORAL | Status: DC

## 2022-03-04 MED ORDER — GABAPENTIN 100 MG PO CAPS: 100.0000 mg | ORAL_CAPSULE | Freq: Three times a day (TID) | ORAL | 0 refills | Status: DC

## 2022-03-04 NOTE — Progress Notes (Signed)
Occupational Therapy Discharge Summary ? ?Patient Details  ?Name: [name] ?MRN: [id number] ?Date of Birth: Jan 07, 1946 ? ?Patient has met 6 of 9 long term goals due to improved activity tolerance, improved balance, postural control, ability to compensate for deficits, improved attention, and improved awareness.  Patient to discharge at [facility] Assist level to SNF rehab given decreased supervision/assist from family.  ? ?Reasons goals not met: Patient continues to be limited by decreased cognition (although improved since initial eval, static/dynamic sitting balance requiring CGA for sit to stand transfers, LB dressing with use of AE and toilet transfers with cues for walker management and safety.  ? ?Recommendation:  ?Patient will benefit from ongoing skilled OT services in skilled nursing facility setting to continue to advance functional skills in the area of BADL and Reduce care partner burden. ? ?Equipment: ?No equipment provided ? ?Reasons for discharge: discharge from hospital ? ?Patient/family agrees with progress made and goals achieved: Yes ? ?OT Discharge ?Precautions/Restrictions  ?Precautions ?Precautions: Fall ?Precaution Comments: watch BP, orthostatic hypotension, pt able to recall strategies (binder and teds, avoiding static standing if possible) ?Required Braces or Orthoses: Other Brace (RUE wrist cock-up splint) ?Other Brace: Thigh high TEDs and abdominal binder for BP control ?Restrictions ?Weight Bearing Restrictions: Yes ?RUE Weight Bearing: Weight bear through elbow only ?Other Position/Activity Restrictions: R wrist fx and L 5th finger fx ?General ?  ?Vital Signs ? ?Pain ?Pain Assessment ?Pain Scale: 0-10 ?Pain Score: 0-No pain ?ADL ?ADL ?Eating: Set up ?Where Assessed-Eating: Chair ?Grooming: Supervision/safety ?Where Assessed-Grooming: Sitting at sink ?Upper Body Bathing: Supervision/safety ?Where Assessed-Upper Body Bathing: Shower ?Lower Body Bathing: Supervision/safety ?Where  Assessed-Lower Body Bathing: Shower (Seated with TED hose donned) ?Upper Body Dressing: Setup ?Where Assessed-Upper Body Dressing: Edge of bed ?Lower Body Dressing: Minimal assistance ?Where Assessed-Lower Body Dressing: Edge of bed ?Toileting: Contact guard ?Where Assessed-Toileting: Toilet ?Toilet Transfer: Contact guard ?Toilet Transfer Method: Ambulating ?Toilet Transfer Equipment: Bedside commode ?Tub/Shower Transfer: Unable to assess ?Walk-In Shower Transfer: Contact guard ?Walk-In Shower Transfer Method: Ambulating ?Vision ?Baseline Vision/History: 1 Wears glasses ?Patient Visual Report: No change from baseline ?Vision Assessment?: No apparent visual deficits ?Perception  ?Perception: Within Functional Limits ?Praxis ?Praxis: Intact ?Cognition ?Cognition ?Overall Cognitive Status: Within Functional Limits for tasks assessed ?Arousal/Alertness: Awake/alert ?Orientation Level: Person;Place;Situation ?Person: Oriented ?Place: Oriented ?Situation: Oriented ?Memory: Impaired ?Memory Impairment: Decreased recall of new information;Decreased short term memory ?Decreased Short Term Memory: Verbal complex;Functional complex ?Attention: Sustained;Selective ?Sustained Attention: Appears intact ?Sustained Attention Impairment: Verbal complex;Functional complex ?Selective Attention: Appears intact ?Awareness: Appears intact ?Problem Solving: Appears intact ?Safety/Judgment: Appears intact ?Brief Interview for Mental Status (BIMS) ?Repetition of Three Words (First Attempt): 3 ?Temporal Orientation: Year: Correct ?Temporal Orientation: Month: Accurate within 5 days ?Temporal Orientation: Day: Incorrect ?Recall: "Sock": Yes, no cue required ?Recall: "Blue": Yes, no cue required ?Recall: "Bed": No, could not recall ?BIMS Summary Score: 12 ?Sensation ?Sensation ?Light Touch: Impaired Detail ?Central sensation comments: Numbness and tingling BLE and BUE, hx of neuropathy ?Light Touch Impaired Details: Impaired RLE;Impaired  LLE ?Hot/Cold: Impaired by gross assessment ?Proprioception: Appears Intact ?Coordination ?Gross Motor Movements are Fluid and Coordinated: No ?Fine Motor Movements are Fluid and Coordinated: No ?Coordination and Movement Description: Decreased postural control and generalized deconditioning, limited by dizziness and orthostasis ?Finger Nose Finger Test: Difficulty to assess on L. ?Motor  ?Motor ?Motor: Other (comment);Abnormal postural alignment and control ?Motor - Discharge Observations: generalized deconditioning ?Mobility  ?Bed Mobility ?Bed Mobility: Sit to Supine;Supine to Sit;Rolling Right;Rolling Left ?Rolling Right: Independent  with assistive device ?Rolling Left: Independent with assistive device ?Supine to Sit: Supervision/Verbal cueing ?Sit to Supine: Supervision/Verbal cueing ?Transfers ?Sit to Stand: Contact Guard/Touching assist ?Stand to Sit: Contact Guard/Touching assist  ?Trunk/Postural Assessment  ?Cervical Assessment ?Cervical Assessment: Within Functional Limits ?Thoracic Assessment ?Thoracic Assessment: Within Functional Limits ?Lumbar Assessment ?Lumbar Assessment: Within Functional Limits ?Postural Control ?Postural Control: Deficits on evaluation ?Righting Reactions: delayed righting reactions  ?Balance ?Balance ?Balance Assessed: Yes ?Static Sitting Balance ?Static Sitting - Balance Support: Feet supported ?Static Sitting - Level of Assistance: 7: Independent ?Dynamic Sitting Balance ?Dynamic Sitting - Balance Support: Feet supported ?Dynamic Sitting - Level of Assistance: 5: Stand by assistance (Min guard) ?Static Standing Balance ?Static Standing - Balance Support: During functional activity;Bilateral upper extremity supported ?Static Standing - Level of Assistance: 5: Stand by assistance ?Dynamic Standing Balance ?Dynamic Standing - Balance Support: Bilateral upper extremity supported;During functional activity ?Dynamic Standing - Level of Assistance: 4: Min assist ?Extremity/Trunk  Assessment ?RUE Assessment ?RUE Assessment: Exceptions to [facility] ?Active Range of Motion (AROM) Comments: 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 R wrist fx w/ wrist cock-up ?RUE Body System: Ortho ?LUE Assessment ?LUE Assessment: Exceptions to [facility] ?Active Range of Motion (AROM) Comments: WFL at elbow, wrist and digits. Mild deficits at shoulder (baseline) ?General Strength Comments: MMT grossly 4-/5 ? ? ?Cecil Bixby R Howerton-Davis ?03/04/2022, 12:33 PM ?

## 2022-03-04 NOTE — Plan of Care (Signed)
?  Problem: Sit to Stand ?Goal: LTG:  Patient will perform sit to stand in prep for activites of daily living with assistance level (OT) ?Description: LTG:  Patient will perform sit to stand in prep for activites of daily living with assistance level (OT) ?03/04/2022 1238 by Howerton-Davis, [place], OT ?Outcome: Not Met (add Reason) ?03/04/2022 1236 by Felipe Drone, OT ?Note: Goal not met: continues to require CGA ?  ?Problem: RH Dressing ?Goal: LTG Patient will perform lower body dressing w/assist (OT) ?Description: LTG: Patient will perform lower body dressing with assist, with/without cues in positioning using equipment (OT) ?03/04/2022 1238 by Howerton-Davis, Tniya Bowditch R, OT ?Outcome: Not Met (add Reason) ?03/04/2022 1236 by Felipe Drone, OT ?Note: Goal not met: continues to require CGA ?  ?Problem: RH Toilet Transfers ?Goal: LTG Patient will perform toilet transfers w/assist (OT) ?Description: LTG: Patient will perform toilet transfers with assist, with/without cues using equipment (OT) ?03/04/2022 1238 by Howerton-Davis, Khalif Stender R, OT ?Outcome: Not Met (add Reason) ?03/04/2022 1236 by Felipe Drone, OT ?Note: Goal not met: continues to require CGA ?  ?

## 2022-03-04 NOTE — Progress Notes (Signed)
Attempted to call report to [facility]. Left my callback number with secretary. ?

## 2022-03-04 NOTE — Progress Notes (Signed)
Pt discharged via stretcher to [facility]. Report called to [place]. Discharge packet and patient belongings given to transporters. No further questions.  ?

## 2022-03-04 NOTE — Progress Notes (Signed)
?                                                       PROGRESS NOTE ? ? ?Subjective/Complaints: ? ? ?Pt reports no changes- will d/c today to SNF.  ? ? ?ROS: ? ? ?Pt denies SOB, abd pain, CP, N/V/C/D, and vision changes ? ? ? ? ? ? ? ? ?Objective: ?  ?No results found. ?Recent Labs  ?  03/03/22 ?0602  ?WBC 5.4  ?HGB 11.4*  ?HCT 34.9*  ?PLT 264  ? ? ? ?Recent Labs  ?  03/03/22 ?0602  ?NA 136  ?K 3.5  ?CL 101  ?CO2 23  ?GLUCOSE 109*  ?BUN 54*  ?CREATININE 2.23*  ?CALCIUM 10.4*  ? ? ?Intake/Output Summary (Last 24 hours) at 03/04/2022 0856 ?Last data filed at 03/04/2022 0750 ?Gross per 24 hour  ?Intake 180 ml  ?Output --  ?Net 180 ml  ?  ? ?  ? ?Physical Exam: ?Vital Signs ?Blood pressure 128/66, pulse (!) 110, temperature 97.7 ?F (36.5 ?C), resp. rate 15, height '5\' 7"'$  (1.702 m), weight 79.6 kg, SpO2 93 %. ? ? ? ?General: awake, alert, appropriate, NAD ?HENT: conjugate gaze; oropharynx moist ?CV: tachycardic rate; no JVD ?Pulmonary: CTA B/L; no W/R/R- good air movement ?GI: soft, NT, ND, (+)BS ?Psychiatric: appropriate ?Neurological: Ox3 ? ?Extremities: no LE edema seen this AM brace on R wrist-  ?Skin: bilateral LE's/shins each with two 2" diameter wounds which are fairly superficial and with 95% granulation, clean appearing. Pics as below ?Neuro:  Alert and oriented x 3. Normal insight and awareness. Intact Memory. Normal language and speech. Cranial nerve exam unremarkable. RUE limited by [facility], LUE 3-4/5. LE 3 prox to 4- distally.  ?Musculoskeletal: right wrist in splint- ? ? ? ?Assessment/Plan: ?1. Functional deficits which require 3+ hours per day of interdisciplinary therapy in a comprehensive inpatient rehab setting. ?Physiatrist is providing close team supervision and 24 hour management of active medical problems listed below. ?Physiatrist and rehab team continue to assess barriers to discharge/monitor patient progress toward functional and medical goals ? ?Care Tool: ? ?Bathing ?   ?Body parts bathed by  patient: Right lower leg, Right arm, Left arm, Left lower leg, Chest, Face, Abdomen, Front perineal area, Right upper leg, Left upper leg  ? Body parts bathed by helper: Buttocks ?  ?  ?Bathing assist Assist Level: Minimal Assistance - Patient > 75% ?  ?  ?Upper Body Dressing/Undressing ?Upper body dressing   ?What is the patient wearing?: Pull over shirt ?   ?Upper body assist Assist Level: Set up assist ?   ?Lower Body Dressing/Undressing ?Lower body dressing ? ? ?   ?What is the patient wearing?: Pants, Underwear/pull up ? ?  ? ?Lower body assist Assist for lower body dressing: Minimal Assistance - Patient > 75% ?   ? ?Toileting ?Toileting    ?Toileting assist Assist for toileting: Moderate Assistance - Patient 50 - 74% ?  ?  ?Transfers ?Chair/bed transfer ? ?Transfers assist ?   ? ?Chair/bed transfer assist level: Contact Guard/Touching assist ?  ?  ?Locomotion ?Ambulation ? ? ?Ambulation assist ? ?   ? ?Assist level: Contact Guard/Touching assist ?Assistive device: Walker-platform ?Max distance: 300  ? ?Walk 10 feet activity ? ? ?Assist ? Walk 10 feet activity did not  occur: Safety/medical concerns (decreased activity tolerance) ? ?Assist level: Contact Guard/Touching assist ?Assistive device: Walker-platform  ? ?Walk 50 feet activity ? ? ?Assist Walk 50 feet with 2 turns activity did not occur: Safety/medical concerns ? ?Assist level: Contact Guard/Touching assist ?Assistive device: Walker-platform  ? ? ?Walk 150 feet activity ? ? ?Assist Walk 150 feet activity did not occur: Safety/medical concerns ? ?Assist level: Contact Guard/Touching assist ?Assistive device: Walker-platform ?  ? ?Walk 10 feet on uneven surface  ?activity ? ? ?Assist Walk 10 feet on uneven surfaces activity did not occur: Safety/medical concerns ? ? ?  ?   ? ?Wheelchair ? ? ? ? ?Assist Is the patient using a wheelchair?: Yes ?Type of Wheelchair: Manual ?  ? ?Wheelchair assist level: Supervision/Verbal cueing ?Max wheelchair distance: 60  ft  ? ? ?Wheelchair 50 feet with 2 turns activity ? ? ? ?Assist ? ?  ?  ? ? ?Assist Level: Supervision/Verbal cueing  ? ?Wheelchair 150 feet activity  ? ? ? ?Assist ?   ? ? ?Assist Level: Minimal Assistance - Patient > 75%  ? ?Blood pressure 128/66, pulse (!) 110, temperature 97.7 ?F (36.5 ?C), resp. rate 15, height '5\' 7"'$  (1.702 m), weight 79.6 kg, SpO2 93 %. ? ?Medical Problem List and Plan: ?1. Functional deficits secondary to debility after congestive heart failure and multiple other med/surg issues ?            -patient may shower if LUE/cast is covered ?            -ELOS/Goals: 14-16 days, supervision goals ? D/c today to SNF- can decide what she wants to do from there.  ?2.  Antithrombotics: ?-DVT/anticoagulation:  sq heparin 5000u q8 ?            -antiplatelet therapy: ASA '81mg'$  daily ?3. Pain Management: hydrocodone 7.'5mg'$  qid ?            -prn tylenol ?            -increased scheduled gabapentin to '200mg'$  tid for neuropathic pain ?            -cymbalta ? 3/7- pain controlled usually- however due to low BP, staff concerned about giving Norco- will d/w team ?Trial tramadol q 12 h dose due to renal insuff  ? 3/19- pain the same- f/u with Dr Grandville Silos ?3/21- will con't Norco/tramadol ?4. Mood: pt relatively up beat. Team to provide ego support ?            -cymbalta ?            -antipsychotic agents: n/a ?5. Neuropsych: This patient is? capable of making decisions on her own behalf. ?6. Skin/Wound Care:   ? -xeroform, kerlix for chronic burn wounds. Wound clean/granulating ?            -pt followed at wound clinic remotely ?            -recent cultures prior to admit + for S Aureus and Pseudomonas ?            -will consult WOC RN for further recs for wound care ? 3/6- WOC suggested topping xeroform with dry gauze and silicone dressings-can use TEDs  ? 3/8- dressings just changed- C/D/I-  ? 3/16- WOC consulted- changed to foam dressings and change q3 days or if soiled ?7. Fluids/Electrolytes/Nutrition: encourage  appropriate po ?            -added protein supps ?            -  RD f/u ?            -reviewed importance of nutrition in her recovery ?8. Acute on chronic CHF: EF 55-60%. Monitor weights daily wit strict I/O ?            -pt diuresed 7L on acute ?            -continue O2 ?            -demadex '20mg'$  daily ?            -zebeta 2.'5mg'$  daily ? 3/7- down to 1kg- however Cr up to 2.83- will call Cards due to low BP on max dose of Midodrine and Cr issues ? 3/8- Wgt down slightly to 80.8 kg from 81.1 kg- Cr down with holding demadex x1 day and 250cc bolus- Cr better at 2.56 today and BUN 66 from 69- ?3/9- Demadex restarts today-  wil recheck labs in AM ? 3/10- will recheck BMP this AM- and also ordered qmonday- drink 6-8 cups of water/day- asked her to ?3.12- weight stable in last 3 days- however Cr up to 3.09 on Friday after she was seen- will recheck in AM and call Cards as required.  ?3/13- Cr down slightly to 2.97 and BUN up to 81- getting more dry- will call Cards to discuss what can be done about Demadex dosing? ?3/14- holding demadex til Thursday- after labs-  ?3/16- Cr 2.15 and BUN down to 59- will d/w Cardsif can do lower dose of Demadex, since every time we restart, she has worsening kidney function as well as worsening lightheadedness/lower BP.  ? 3/17- will check in AM since restarted Demadex ?3/18- Cr down slightly more to 2.07 and BUN stable at 58- will recheck Monday ? 3/19- labs in AM ?9. Acute on chronic renal failure: Baseline SCr 1.4-->continue to moitor ?            --likely due to cardiorenal syndrome, recent contrast and Bactrim. ? 3/8- Cr 2.56- down from 2.83 ? 3/10- will recheck this AM ? 3/12- Cr up to 3.09- will recheck in AM- since was Friday- has cardiorenal syndrome- so hopeful will improve, but also on Demadex-  ? 3/13- Cr 2.97 and BUN up to 81- I think small dose of IVFs is in order, however will d/w Cards ? 3/14- holding demadex til Thursday ? 3/16- Cr 2.15 and BUN 59- better off Demadex ? 3/18-  Cr 2.07 and Bun 58- recheck Monday ? 3/21- Cr stable- con't regimen ?10. Hx f HTN, now with Orthostatic Hypotension and sinus tach:  ?-Continue midodrine  ?            -zebeta for rate control ?            -

## 2022-03-04 NOTE — Progress Notes (Signed)
Report called to Kathleen at [facility].  

## 2022-03-05 DIAGNOSIS — N179 Acute kidney failure, unspecified: Secondary | ICD-10-CM | POA: Diagnosis not present

## 2022-03-05 DIAGNOSIS — S62109A Fracture of unspecified carpal bone, unspecified wrist, initial encounter for closed fracture: Secondary | ICD-10-CM | POA: Diagnosis not present

## 2022-03-05 DIAGNOSIS — I272 Pulmonary hypertension, unspecified: Secondary | ICD-10-CM | POA: Diagnosis not present

## 2022-03-05 DIAGNOSIS — I639 Cerebral infarction, unspecified: Secondary | ICD-10-CM | POA: Diagnosis not present

## 2022-03-06 DIAGNOSIS — L98499 Non-pressure chronic ulcer of skin of other sites with unspecified severity: Secondary | ICD-10-CM | POA: Diagnosis not present

## 2022-03-07 DIAGNOSIS — F32A Depression, unspecified: Secondary | ICD-10-CM | POA: Diagnosis not present

## 2022-03-07 DIAGNOSIS — G2581 Restless legs syndrome: Secondary | ICD-10-CM | POA: Diagnosis not present

## 2022-03-07 DIAGNOSIS — G894 Chronic pain syndrome: Secondary | ICD-10-CM | POA: Diagnosis not present

## 2022-03-07 DIAGNOSIS — I951 Orthostatic hypotension: Secondary | ICD-10-CM | POA: Diagnosis not present

## 2022-03-07 DIAGNOSIS — I5030 Unspecified diastolic (congestive) heart failure: Secondary | ICD-10-CM | POA: Diagnosis not present

## 2022-03-07 DIAGNOSIS — K219 Gastro-esophageal reflux disease without esophagitis: Secondary | ICD-10-CM | POA: Diagnosis not present

## 2022-03-07 DIAGNOSIS — G629 Polyneuropathy, unspecified: Secondary | ICD-10-CM | POA: Diagnosis not present

## 2022-03-07 DIAGNOSIS — G4700 Insomnia, unspecified: Secondary | ICD-10-CM | POA: Diagnosis not present

## 2022-03-07 DIAGNOSIS — J45909 Unspecified asthma, uncomplicated: Secondary | ICD-10-CM | POA: Diagnosis not present

## 2022-03-07 DIAGNOSIS — I482 Chronic atrial fibrillation, unspecified: Secondary | ICD-10-CM | POA: Diagnosis not present

## 2022-03-07 DIAGNOSIS — D649 Anemia, unspecified: Secondary | ICD-10-CM | POA: Diagnosis not present

## 2022-03-07 DIAGNOSIS — M1990 Unspecified osteoarthritis, unspecified site: Secondary | ICD-10-CM | POA: Diagnosis not present

## 2022-03-11 DIAGNOSIS — R42 Dizziness and giddiness: Secondary | ICD-10-CM | POA: Diagnosis not present

## 2022-03-11 DIAGNOSIS — G894 Chronic pain syndrome: Secondary | ICD-10-CM | POA: Diagnosis not present

## 2022-03-11 DIAGNOSIS — J45909 Unspecified asthma, uncomplicated: Secondary | ICD-10-CM | POA: Diagnosis not present

## 2022-03-11 DIAGNOSIS — M25531 Pain in right wrist: Secondary | ICD-10-CM | POA: Diagnosis not present

## 2022-03-11 DIAGNOSIS — I5030 Unspecified diastolic (congestive) heart failure: Secondary | ICD-10-CM | POA: Diagnosis not present

## 2022-03-11 DIAGNOSIS — I482 Chronic atrial fibrillation, unspecified: Secondary | ICD-10-CM | POA: Diagnosis not present

## 2022-03-13 DIAGNOSIS — I5030 Unspecified diastolic (congestive) heart failure: Secondary | ICD-10-CM | POA: Diagnosis not present

## 2022-03-13 DIAGNOSIS — Z9889 Other specified postprocedural states: Secondary | ICD-10-CM | POA: Diagnosis not present

## 2022-03-13 DIAGNOSIS — F32A Depression, unspecified: Secondary | ICD-10-CM | POA: Diagnosis not present

## 2022-03-13 DIAGNOSIS — S62617D Displaced fracture of proximal phalanx of left little finger, subsequent encounter for fracture with routine healing: Secondary | ICD-10-CM | POA: Diagnosis not present

## 2022-03-13 DIAGNOSIS — L97429 Non-pressure chronic ulcer of left heel and midfoot with unspecified severity: Secondary | ICD-10-CM | POA: Diagnosis not present

## 2022-03-13 DIAGNOSIS — R252 Cramp and spasm: Secondary | ICD-10-CM | POA: Diagnosis not present

## 2022-03-13 DIAGNOSIS — S52571P Other intraarticular fracture of lower end of right radius, subsequent encounter for closed fracture with malunion: Secondary | ICD-10-CM | POA: Diagnosis not present

## 2022-03-13 DIAGNOSIS — J45909 Unspecified asthma, uncomplicated: Secondary | ICD-10-CM | POA: Diagnosis not present

## 2022-03-14 DIAGNOSIS — M25531 Pain in right wrist: Secondary | ICD-10-CM | POA: Diagnosis not present

## 2022-03-14 DIAGNOSIS — J45909 Unspecified asthma, uncomplicated: Secondary | ICD-10-CM | POA: Diagnosis not present

## 2022-03-14 DIAGNOSIS — I5030 Unspecified diastolic (congestive) heart failure: Secondary | ICD-10-CM | POA: Diagnosis not present

## 2022-03-14 DIAGNOSIS — E559 Vitamin D deficiency, unspecified: Secondary | ICD-10-CM | POA: Diagnosis not present

## 2022-03-14 DIAGNOSIS — I482 Chronic atrial fibrillation, unspecified: Secondary | ICD-10-CM | POA: Diagnosis not present

## 2022-03-17 DIAGNOSIS — I5030 Unspecified diastolic (congestive) heart failure: Secondary | ICD-10-CM | POA: Diagnosis not present

## 2022-03-17 DIAGNOSIS — F32A Depression, unspecified: Secondary | ICD-10-CM | POA: Diagnosis not present

## 2022-03-17 DIAGNOSIS — J45909 Unspecified asthma, uncomplicated: Secondary | ICD-10-CM | POA: Diagnosis not present

## 2022-03-17 DIAGNOSIS — I482 Chronic atrial fibrillation, unspecified: Secondary | ICD-10-CM | POA: Diagnosis not present

## 2022-03-17 DIAGNOSIS — G894 Chronic pain syndrome: Secondary | ICD-10-CM | POA: Diagnosis not present

## 2022-03-18 ENCOUNTER — Encounter (HOSPITAL_COMMUNITY): Payer: Self-pay

## 2022-03-18 ENCOUNTER — Ambulatory Visit (HOSPITAL_COMMUNITY)
Admit: 2022-03-18 | Discharge: 2022-03-18 | Disposition: A | Discharge status: Home / Self Care | Payer: Medicare Other | Attending: Family Medicine | Admitting: Family Medicine

## 2022-03-18 VITALS — BP 142/84 | HR 106 | Wt 188.0 lb

## 2022-03-18 DIAGNOSIS — G4733 Obstructive sleep apnea (adult) (pediatric): Secondary | ICD-10-CM | POA: Diagnosis not present

## 2022-03-18 DIAGNOSIS — Z8673 Personal history of transient ischemic attack (TIA), and cerebral infarction without residual deficits: Secondary | ICD-10-CM | POA: Diagnosis not present

## 2022-03-18 DIAGNOSIS — Z8249 Family history of ischemic heart disease and other diseases of the circulatory system: Secondary | ICD-10-CM | POA: Insufficient documentation

## 2022-03-18 DIAGNOSIS — E1122 Type 2 diabetes mellitus with diabetic chronic kidney disease: Secondary | ICD-10-CM | POA: Diagnosis not present

## 2022-03-18 DIAGNOSIS — I272 Pulmonary hypertension, unspecified: Secondary | ICD-10-CM | POA: Insufficient documentation

## 2022-03-18 DIAGNOSIS — N183 Chronic kidney disease, stage 3 unspecified: Secondary | ICD-10-CM | POA: Diagnosis not present

## 2022-03-18 DIAGNOSIS — I359 Nonrheumatic aortic valve disorder, unspecified: Secondary | ICD-10-CM | POA: Insufficient documentation

## 2022-03-18 DIAGNOSIS — I5030 Unspecified diastolic (congestive) heart failure: Secondary | ICD-10-CM | POA: Diagnosis not present

## 2022-03-18 DIAGNOSIS — I951 Orthostatic hypotension: Secondary | ICD-10-CM | POA: Insufficient documentation

## 2022-03-18 DIAGNOSIS — Z9884 Bariatric surgery status: Secondary | ICD-10-CM | POA: Insufficient documentation

## 2022-03-18 DIAGNOSIS — I639 Cerebral infarction, unspecified: Secondary | ICD-10-CM

## 2022-03-18 DIAGNOSIS — R000 Tachycardia, unspecified: Secondary | ICD-10-CM

## 2022-03-18 DIAGNOSIS — E1142 Type 2 diabetes mellitus with diabetic polyneuropathy: Secondary | ICD-10-CM | POA: Diagnosis not present

## 2022-03-18 DIAGNOSIS — F32A Depression, unspecified: Secondary | ICD-10-CM | POA: Diagnosis not present

## 2022-03-18 DIAGNOSIS — R5381 Other malaise: Secondary | ICD-10-CM | POA: Diagnosis not present

## 2022-03-18 DIAGNOSIS — I5022 Chronic systolic (congestive) heart failure: Secondary | ICD-10-CM

## 2022-03-18 DIAGNOSIS — G2581 Restless legs syndrome: Secondary | ICD-10-CM | POA: Insufficient documentation

## 2022-03-18 DIAGNOSIS — I5032 Chronic diastolic (congestive) heart failure: Secondary | ICD-10-CM | POA: Insufficient documentation

## 2022-03-18 DIAGNOSIS — I482 Chronic atrial fibrillation, unspecified: Secondary | ICD-10-CM | POA: Diagnosis not present

## 2022-03-18 DIAGNOSIS — Z022 Encounter for examination for admission to residential institution: Secondary | ICD-10-CM | POA: Insufficient documentation

## 2022-03-18 DIAGNOSIS — G894 Chronic pain syndrome: Secondary | ICD-10-CM | POA: Diagnosis not present

## 2022-03-18 LAB — URINALYSIS, ROUTINE W REFLEX MICROSCOPIC
Bacteria, UA: NONE SEEN
Bilirubin Urine: NEGATIVE
Glucose, UA: NEGATIVE mg/dL
Ketones, ur: NEGATIVE mg/dL
Leukocytes,Ua: NEGATIVE
Nitrite: NEGATIVE
Protein, ur: NEGATIVE mg/dL
Specific Gravity, Urine: 1.006 (ref 1.005–1.030)
pH: 5 (ref 5.0–8.0)

## 2022-03-18 LAB — CBC
HCT: 30.1 % — ABNORMAL LOW (ref 36.0–46.0)
Hemoglobin: 9.8 g/dL — ABNORMAL LOW (ref 12.0–15.0)
MCH: 32.6 pg (ref 26.0–34.0)
MCHC: 32.6 g/dL (ref 30.0–36.0)
MCV: 100 fL (ref 80.0–100.0)
Platelets: 296 10*3/uL (ref 150–400)
RBC: 3.01 MIL/uL — ABNORMAL LOW (ref 3.87–5.11)
RDW: 14.1 % (ref 11.5–15.5)
WBC: 5.5 10*3/uL (ref 4.0–10.5)
nRBC: 0 % (ref 0.0–0.2)

## 2022-03-18 LAB — BASIC METABOLIC PANEL
Anion gap: 10 (ref 5–15)
BUN: 33 mg/dL — ABNORMAL HIGH (ref 8–23)
CO2: 22 mmol/L (ref 22–32)
Calcium: 9.7 mg/dL (ref 8.9–10.3)
Chloride: 108 mmol/L (ref 98–111)
Creatinine, Ser: 1.52 mg/dL — ABNORMAL HIGH (ref 0.44–1.00)
GFR, Estimated: 36 mL/min — ABNORMAL LOW (ref >60)
Glucose, Bld: 98 mg/dL (ref 70–99)
Potassium: 3.5 mmol/L (ref 3.5–5.1)
Sodium: 140 mmol/L (ref 135–145)

## 2022-03-18 LAB — BRAIN NATRIURETIC PEPTIDE: B Natriuretic Peptide: 1209.5 pg/mL — ABNORMAL HIGH (ref 0.0–100.0)

## 2022-03-18 MED ORDER — MIDODRINE HCL 10 MG PO TABS: 10.0000 mg | ORAL_TABLET | Freq: Three times a day (TID) | ORAL | 2 refills | Status: DC

## 2022-03-18 MED ORDER — TORSEMIDE 20 MG PO TABS: 20.0000 mg | ORAL_TABLET | Freq: Every day | ORAL | 2 refills | Status: DC

## 2022-03-18 MED ORDER — ROPINIROLE HCL 1 MG PO TABS: 2.0000 mg | ORAL_TABLET | Freq: Every day | ORAL | Status: DC

## 2022-03-18 NOTE — Addendum Note (Signed)
Encounter addended by: Rafael Bihari, FNP on: 03/18/2022 2:12 PM ? Actions taken: Clinical Note Signed

## 2022-03-18 NOTE — Patient Outreach (Signed)
[name]) Care Management ? ?03/18/2022 ? ?[name] ?Sep 02, 1946 ?[id number] ? ? ?Received referral for Care Management from Insurance plan. Assigned patient to Joellyn Quails, RN Care Coordinator for follow up. ? ?Philmore Pali ?[place] Management Assistant ?[phone] ? ?

## 2022-03-18 NOTE — Progress Notes (Addendum)
? ?ADVANCED HF CLINIC CONSULT NOTE ? ? ?Primary Care: [name], DO ?HF Cardiologist: Dr. Aundra Dubin ? ?HPI: ?[name] is a 76 y.o.female with history of cryptogenic stroke s/p LINQ with no evidence of AF, hx anomalous origin of the RCA, OSA, pulmonary hypertension, hx gastric bypass surgery, peripheral neuropathy. ?  ?Admitted 2/23 from EP office with complaints of dyspnea. CTA chest no PE. Echo showed EF 55-60%, RV okay, RVSP 41.9 mmHg, mild to moderate MR, moderate AI, mild AS with mean gradient 15 mmHg, dilated IVC with estimated RA pressure 8 mmHg. Had negative stress test. CTA chest no evidence of PE, 4th and 6th left rib fractures, b/l pleural effusions. RHC showed RA mean 16, PCWP mean 23 mmHg, PAP mean 38 mmHg, Fick CO/CI 7.5/3.71, PAPi 1.75, PVR 1.99. She was diuresed with IV lasix. dCHF, orthostasis and peripheral neuropathy suspicious for amyloid, however cMRI was equivocal for amyloidosis and PYP was not suggestive. GDMT limited by orthostasis and elevated SCr. Discharged to CIR, weight 180 lbs. ?  ?Today she returns for post hospital HF follow up. She is at [facility] for rehab. She has occasional dizziness but no recent falls. She is working with PT and does not have dyspnea with this or transferring out of her wheelchair. Limited mainly by leg weakness. Feels palpitations and noticed more swelling in legs. Denies abnormal bleeding, CP, or PND/Orthopnea. Appetite ok. No fever or chills. Not weighing regularly at [facility]. Taking all medications provided by facility. Plans to go back home with husband next week. ? ?ECG (personally reviewed): ST w/ PACs 103 bpm ? ?Labs (3/23): K 3.5, creatinine 2.23, hgb 11.4 ? ?Review of Systems: [y] = yes, '[ ]'$  = no  ? ?General: Weight gain '[ ]'$ ; Weight loss '[ ]'$ ; Anorexia '[ ]'$ ; Fatigue Blue.Reese ]; Fever '[ ]'$ ; Chills '[ ]'$ ; Weakness Blue.Reese ]  ?Cardiac: Chest pain/pressure '[ ]'$ ; Resting SOB '[ ]'$ ; Exertional SOB '[ ]'$ ; Orthopnea '[ ]'$ ; Pedal Edema Blue.Reese ]; Palpitations '[ ]'$ ; Syncope '[ ]'$ ;  Presyncope '[ ]'$ ; Paroxysmal nocturnal dyspnea'[ ]'$   ?Pulmonary: Cough '[ ]'$ ; Wheezing'[ ]'$ ; Hemoptysis'[ ]'$ ; Sputum '[ ]'$ ; Snoring '[ ]'$   ?GI: Vomiting'[ ]'$ ; Dysphagia'[ ]'$ ; Melena'[ ]'$ ; Hematochezia '[ ]'$ ; Heartburn'[ ]'$ ; Abdominal pain '[ ]'$ ; Constipation '[ ]'$ ; Diarrhea '[ ]'$ ; BRBPR '[ ]'$   ?GU: Hematuria'[ ]'$ ; Dysuria '[ ]'$ ; Nocturia'[ ]'$   ?Vascular: Pain in legs with walking '[ ]'$ ; Pain in feet with lying flat '[ ]'$ ; Non-healing sores '[ ]'$ ; Stroke Blue.Reese ]; TIA '[ ]'$ ; Slurred speech '[ ]'$ ;  ?Neuro: Headaches'[ ]'$ ; Vertigo'[ ]'$ ; Seizures'[ ]'$ ; Paresthesias'[ ]'$ ;Blurred vision '[ ]'$ ; Diplopia '[ ]'$ ; Vision changes '[ ]'$   ?Ortho/Skin: Arthritis '[ ]'$ ; Joint pain '[ ]'$ ; Muscle pain '[ ]'$ ; Joint swelling '[ ]'$ ; Back Pain '[ ]'$ ; Rash '[ ]'$   ?Psych: Depression'[ ]'$ ; Anxiety'[ ]'$   ?Heme: Bleeding problems '[ ]'$ ; Clotting disorders '[ ]'$ ; Anemia Blue.Reese ]  ?Endocrine: Diabetes '[ ]'$ ; Thyroid dysfunction'[ ]'$  ? ?Past Medical History:  ?Diagnosis Date  ? Anemia   ? unable to absorb iron after gastric bypass  ? Arthritis   ? generalized  ? Asthma   ? Atrophic vaginitis   ? Back pain   ? DDD/stenosis  ? Carotid stenosis   ? Carotid US 10/16: Plaque RICA (0-25%), normal LICA  ? Depression   ? takes Cymbalta daily  ? Diverticulosis   ? benign  ? DJD (degenerative joint disease)   ? Dyslipidemia   ? Dysrhythmia   ? Family history of GI  bleeding   ? GERD (gastroesophageal reflux disease)   ? takes Nexium daily  ? Gestational diabetes   ? Pt denies  ? H/O hiatal hernia   ? surgery for hernia  ? Headache(784.0)   ? takes Imitrex daily as needed and Bisoprolol daily;last migraine was about 2wks ago  ? History of bronchitis 1 yr ago  ? History of shingles   ? Insomnia   ? takes Ambien nightly  ? Joint pain   ? Joint swelling   ? Leg cramps   ? takes Flexeril daily as needed  ? 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 Nocturia   ? Osteoporosis   ? Peripheral neuropathy   ? takes Gabapentin daily  ? Pneumonia 31yr ago  ? hx of  ? PONV (postoperative nausea and vomiting)   ? Restless leg syndrome   ? takes Requip daily  ? RLS  (restless legs syndrome) 08/16/2015  ? Sleep apnea   ? uses CPAP  ? Stroke ([facility]   ? Tubular adenoma of colon   ? Vertigo   ? Vitamin D deficiency   ? ? ?Current Outpatient Medications  ?Medication Sig Dispense Refill  ? acetaminophen (TYLENOL) 325 MG tablet Take 2 tablets (650 mg total) by mouth every 4 (four) hours as needed for headache or mild pain.    ? ASPIRIN 81 PO Take 81 mg by mouth at bedtime.    ? esomeprazole (NEXIUM) 40 MG capsule Take 40 mg by mouth 2 (two) times daily before a meal.   6  ? gabapentin (NEURONTIN) 100 MG capsule Take 1 capsule (100 mg total) by mouth 3 (three) times daily. 15 capsule 0  ? HYDROcodone-acetaminophen (NORCO) 7.5-325 MG tablet Take 1 tablet by mouth 4 (four) times daily. 12 tablet 0  ? meclizine (ANTIVERT) 12.5 MG tablet Take 1 tablet (12.5 mg total) by mouth 3 (three) times daily. 30 tablet 0  ? melatonin 5 MG TABS Take 1 tablet (5 mg total) by mouth at bedtime.  0  ? midodrine (PROAMATINE) 10 MG tablet Patient takes 1.5 tablets by mouth 3 times daily with meals.    ? rOPINIRole (REQUIP) 1 MG tablet Take 1 tablet (1 mg total) by mouth at bedtime.    ? torsemide (DEMADEX) 20 MG tablet Take 1 tablet (20 mg total) by mouth every other day. Starting Thursday    ? triamcinolone ointment (KENALOG) 0.1 % Apply topically 2 (two) times daily.    ? benzocaine (ORAJEL) 10 % mucosal gel Use as directed in the mouth or throat 4 (four) times daily as needed for mouth pain. 5.3 g 0  ? calcium carbonate (OS-CAL) 1250 (500 CA) MG chewable tablet Chew 1 tablet by mouth daily. Not sure of dosage    ? DULoxetine (CYMBALTA) 60 MG capsule Take 60 mg by mouth 2 (two) times daily.  11  ? flavoxATE (URISPAS) 100 MG tablet Take 1 tablet (100 mg total) by mouth 3 (three) times daily as needed for bladder spasms (please offer to pt- cannot use pyridium due to kidney fxn). 30 tablet 0  ? ondansetron (ZOFRAN-ODT) 4 MG disintegrating tablet DISSOLVE 1 TABLET(4 MG) ON THE TONGUE EVERY 8 HOURS AS NEEDED FOR  NAUSEA OR VOMITING (Patient taking differently: Take 4 mg by mouth every 8 (eight) hours as needed for nausea.) 45 tablet 2  ? PROAIR HFA 108 (90 Base) MCG/ACT inhaler Inhale 1 puff into the lungs every 6 (six) hours as needed for shortness of breath.  5  ?  senna (SENOKOT) 8.6 MG TABS tablet Take 1 tablet (8.6 mg total) by mouth daily. 120 tablet 0  ? Vitamin D, Ergocalciferol, (DRISDOL) 1.25 MG (50000 UNIT) CAPS capsule Take 1 capsule (50,000 Units total) by mouth every 7 (seven) days. 13 capsule 1  ? ?No current facility-administered medications for this encounter.  ? ?Allergies  ?Allergen Reactions  ? Atorvastatin   ?  Other reaction(s): myalgia  ? Crestor [Rosuvastatin]   ?  Other reaction(s): myalgia  ? Ezetimibe   ?  Other reaction(s): myalgia  ? Amitriptyline Rash  ? ?Social History  ? ?Socioeconomic History  ? Marital status: Married  ?  Spouse name: Pilar Jarvis  ? Number of children: 3  ? Years of education: college  ? Highest education level: Not on file  ?Occupational History  ? Occupation: Retired  ?Tobacco Use  ? Smoking status: Never  ? Smokeless tobacco: Never  ?Vaping Use  ? Vaping Use: Never used  ?Substance and Sexual Activity  ? Alcohol use: Yes  ?  Alcohol/week: 0.0 standard drinks  ?  Comment: Occasional  ? Drug use: No  ? Sexual activity: Yes  ?  Birth control/protection: Post-menopausal  ?Other Topics Concern  ? Not on file  ?Social History Narrative  ? Lives at home w/ her husband  ? Caffeine 8-10 cups daily.  (coffee 1 cup am, unsw tea all day long).   ? ?Social Determinants of Health  ? ?Financial Resource Strain: Not on file  ?Food Insecurity: Not on file  ?Transportation Needs: Not on file  ?Physical Activity: Not on file  ?Stress: Not on file  ?Social Connections: Not on file  ?Intimate Partner Violence: Not on file  ? ?Family History  ?Problem Relation Age of Onset  ? CAD Mother   ? Hypertension Mother   ? Neuropathy Mother   ? Osteoarthritis Mother   ? Heart disease Mother   ? Breast  cancer Maternal Grandmother   ? CAD Paternal Grandfather   ? Colon cancer Father   ? ?BP (!) 142/84   Pulse (!) 106   Wt 85.3 kg   SpO2 100%   BMI 29.44 kg/m?  ? ?Wt Readings from Last 3 Encounters:  ?04/

## 2022-03-18 NOTE — Patient Instructions (Addendum)
EKG done today. ? ?Labs done today. We will contact you only if your labs are abnormal. ? ?DECREASE Midodrine to '10mg'$  (1 tablet) by mouth 3 times daily. ? ?INCREASE Requip to 2 tablets by mouth daily at bedtime.  ? ?INCREASE Torsemide to '20mg'$  (1 tablet) by mouth daily.  ? ?No other medication changes were made. Please continue all current medications as prescribed. ? ?Genetic test has been done, this has to be sent to [place] to be processed and can take 1-2 weeks to get results back.  We will let you know the results. ? ?Your physician recommends that you schedule a follow-up appointment in: 2-3 weeks with our NP/PA Clinic and in 3-4 months with Dr. Aundra Dubin ? ?If you have any questions or concerns before your next appointment please send [place] a message through [place] or call our office at [phone].   ? ?TO LEAVE A MESSAGE FOR THE NURSE SELECT OPTION 2, PLEASE LEAVE A MESSAGE INCLUDING: ?YOUR NAME ?DATE OF BIRTH ?CALL BACK NUMBER ?REASON FOR CALL**this is important as we prioritize the call backs ? ?YOU WILL RECEIVE A CALL BACK THE SAME DAY AS LONG AS YOU CALL BEFORE 4:00 PM ? ? ?Do the following things EVERYDAY: ?Weigh yourself in the morning before breakfast. Write it down and keep it in a log. ?Take your medicines as prescribed ?Eat low salt foods--Limit salt (sodium) to 2000 mg per day.  ?Stay as active as you can everyday ?Limit all fluids for the day to less than 2 liters ? ? ?At the [facility], you and your health needs are our priority. As part of our continuing mission to provide you with exceptional heart care, we have created designated Provider Care Teams. These Care Teams include your primary Cardiologist (physician) and Advanced Practice Providers (APPs- Physician Assistants and Nurse Practitioners) who all work together to provide you with the care you need, when you need it.  ? ?You may see any of the following providers on your designated Care Team at your next follow up: ?Dr  Glori Bickers ?Dr Loralie Champagne ?Darrick Grinder, NP ?Lyda Jester, PA ?Audry Riles, PharmD ? ? ?Please be sure to bring in all your medications bottles to every appointment.  ? ?

## 2022-03-19 ENCOUNTER — Other Ambulatory Visit: Payer: Self-pay | Admitting: *Deleted

## 2022-03-19 NOTE — Patient Outreach (Signed)
Per [place] eligible member currently resides in [facility] SNF.  Screening for potential post SNF care coordination needs as a benefit of [employer] plan. ? ?Member's PCP at [facility] has [facility] care management services available. ? ?Mrs. Younes admitted to SNF on 03/04/22 after hospitalization. ? ?Facility site visit to [facility] skilled nursing facility. Went to Mrs. Secrist's room at [facility] to discuss transition plans. Mrs. Brailsford was off the unit. ? ?Made [facility] SNF SW aware writer is following for transition plans/needs.  ? ? ? ?Marthenia Rolling, MSN, RN,BSN ?[place] Coordinator ?[phone] [name]) ?[phone]  (Toll free office)  ? ? ? ? ? ?  ?

## 2022-03-20 ENCOUNTER — Other Ambulatory Visit: Payer: Self-pay | Admitting: *Deleted

## 2022-03-20 ENCOUNTER — Encounter: Payer: Medicare Other | Admitting: Neurology

## 2022-03-20 DIAGNOSIS — L97429 Non-pressure chronic ulcer of left heel and midfoot with unspecified severity: Secondary | ICD-10-CM | POA: Diagnosis not present

## 2022-03-20 NOTE — Patient Outreach (Signed)
[facility] Post- Acute Care Coordinator follow up. Per [place] eligible member currently resides in [facility] SN[facility].  Screening for care coordination services as a benefit of [employer] plan. ? ?Member's PCP at [facility] has Upstream care management services. ? ?Telephone call made to Mrs. Gillen [phone] to identify any post SNF care coordination/care management needs. However, no answer. HIPAA compliant voicemail message left to request return call.  ? ?Will follow up at later time.  ? ? ?Marthenia Rolling, MSN, RN,BSN ?[place] Coordinator ?[phone] [facility]) ?[phone]  (Toll free office)  ?

## 2022-03-25 ENCOUNTER — Other Ambulatory Visit: Payer: Self-pay | Admitting: *Deleted

## 2022-03-25 DIAGNOSIS — M1990 Unspecified osteoarthritis, unspecified site: Secondary | ICD-10-CM | POA: Diagnosis not present

## 2022-03-25 DIAGNOSIS — G894 Chronic pain syndrome: Secondary | ICD-10-CM | POA: Diagnosis not present

## 2022-03-25 DIAGNOSIS — I482 Chronic atrial fibrillation, unspecified: Secondary | ICD-10-CM | POA: Diagnosis not present

## 2022-03-25 DIAGNOSIS — J45909 Unspecified asthma, uncomplicated: Secondary | ICD-10-CM | POA: Diagnosis not present

## 2022-03-25 DIAGNOSIS — M109 Gout, unspecified: Secondary | ICD-10-CM | POA: Diagnosis not present

## 2022-03-25 DIAGNOSIS — I5030 Unspecified diastolic (congestive) heart failure: Secondary | ICD-10-CM | POA: Diagnosis not present

## 2022-03-25 DIAGNOSIS — F32A Depression, unspecified: Secondary | ICD-10-CM | POA: Diagnosis not present

## 2022-03-25 NOTE — Patient Outreach (Signed)
[facility]) Care Management ? ?03/25/2022 ? ?[name] ?March 26, 1946 ?[id number] ? ? ?[place] coordination with case closure  ? ?[name] was referred to [facility] for screening for [facility] RN CM services but has an external CM vendor ? ?Plan case closure- external CM vendor plus she is at [place] per [facility] post acute care coordinator, Lonn Georgia on 03/19/22  ? ? ?[name] L. Lavina Hamman, RN, BSN, CCM ?[place] Management Care Coordinator ?Office number [phone] ? ?

## 2022-03-26 DIAGNOSIS — R3912 Poor urinary stream: Secondary | ICD-10-CM | POA: Diagnosis not present

## 2022-03-26 DIAGNOSIS — R350 Frequency of micturition: Secondary | ICD-10-CM | POA: Diagnosis not present

## 2022-03-26 DIAGNOSIS — R3914 Feeling of incomplete bladder emptying: Secondary | ICD-10-CM | POA: Diagnosis not present

## 2022-03-27 DIAGNOSIS — L97819 Non-pressure chronic ulcer of other part of right lower leg with unspecified severity: Secondary | ICD-10-CM | POA: Diagnosis not present

## 2022-03-28 DIAGNOSIS — M1990 Unspecified osteoarthritis, unspecified site: Secondary | ICD-10-CM | POA: Diagnosis not present

## 2022-03-28 DIAGNOSIS — J45909 Unspecified asthma, uncomplicated: Secondary | ICD-10-CM | POA: Diagnosis not present

## 2022-03-28 DIAGNOSIS — G894 Chronic pain syndrome: Secondary | ICD-10-CM | POA: Diagnosis not present

## 2022-03-28 DIAGNOSIS — I482 Chronic atrial fibrillation, unspecified: Secondary | ICD-10-CM | POA: Diagnosis not present

## 2022-03-28 DIAGNOSIS — M109 Gout, unspecified: Secondary | ICD-10-CM | POA: Diagnosis not present

## 2022-03-28 DIAGNOSIS — I5030 Unspecified diastolic (congestive) heart failure: Secondary | ICD-10-CM | POA: Diagnosis not present

## 2022-03-30 ENCOUNTER — Emergency Department (HOSPITAL_COMMUNITY): Payer: Medicare Other

## 2022-03-30 ENCOUNTER — Inpatient Hospital Stay (HOSPITAL_COMMUNITY)
Admission: EM | Admit: 2022-03-30 | Discharge: 2022-04-04 | DRG: 291 | Disposition: A | Discharge status: Rehabilitation Facility | Payer: Medicare Other | Attending: Internal Medicine | Admitting: Internal Medicine

## 2022-03-30 ENCOUNTER — Encounter (HOSPITAL_COMMUNITY): Payer: Self-pay | Admitting: Emergency Medicine

## 2022-03-30 ENCOUNTER — Telehealth: Payer: Self-pay | Admitting: Student

## 2022-03-30 DIAGNOSIS — E669 Obesity, unspecified: Secondary | ICD-10-CM | POA: Diagnosis present

## 2022-03-30 DIAGNOSIS — N189 Chronic kidney disease, unspecified: Secondary | ICD-10-CM | POA: Diagnosis not present

## 2022-03-30 DIAGNOSIS — G4733 Obstructive sleep apnea (adult) (pediatric): Secondary | ICD-10-CM | POA: Diagnosis not present

## 2022-03-30 DIAGNOSIS — Z66 Do not resuscitate: Secondary | ICD-10-CM | POA: Diagnosis present

## 2022-03-30 DIAGNOSIS — J302 Other seasonal allergic rhinitis: Secondary | ICD-10-CM | POA: Diagnosis not present

## 2022-03-30 DIAGNOSIS — N390 Urinary tract infection, site not specified: Secondary | ICD-10-CM | POA: Diagnosis not present

## 2022-03-30 DIAGNOSIS — M1039 Gout due to renal impairment, multiple sites: Secondary | ICD-10-CM | POA: Diagnosis not present

## 2022-03-30 DIAGNOSIS — N179 Acute kidney failure, unspecified: Secondary | ICD-10-CM | POA: Diagnosis not present

## 2022-03-30 DIAGNOSIS — I2722 Pulmonary hypertension due to left heart disease: Secondary | ICD-10-CM | POA: Diagnosis present

## 2022-03-30 DIAGNOSIS — I82462 Acute embolism and thrombosis of left calf muscular vein: Secondary | ICD-10-CM | POA: Diagnosis present

## 2022-03-30 DIAGNOSIS — Z96652 Presence of left artificial knee joint: Secondary | ICD-10-CM | POA: Diagnosis present

## 2022-03-30 DIAGNOSIS — E876 Hypokalemia: Secondary | ICD-10-CM | POA: Diagnosis present

## 2022-03-30 DIAGNOSIS — Z79899 Other long term (current) drug therapy: Secondary | ICD-10-CM | POA: Diagnosis not present

## 2022-03-30 DIAGNOSIS — I2699 Other pulmonary embolism without acute cor pulmonale: Secondary | ICD-10-CM | POA: Diagnosis not present

## 2022-03-30 DIAGNOSIS — I5033 Acute on chronic diastolic (congestive) heart failure: Secondary | ICD-10-CM | POA: Diagnosis not present

## 2022-03-30 DIAGNOSIS — G2581 Restless legs syndrome: Secondary | ICD-10-CM | POA: Diagnosis present

## 2022-03-30 DIAGNOSIS — E785 Hyperlipidemia, unspecified: Secondary | ICD-10-CM | POA: Diagnosis present

## 2022-03-30 DIAGNOSIS — I2693 Single subsegmental pulmonary embolism without acute cor pulmonale: Secondary | ICD-10-CM | POA: Diagnosis present

## 2022-03-30 DIAGNOSIS — I440 Atrioventricular block, first degree: Secondary | ICD-10-CM | POA: Diagnosis present

## 2022-03-30 DIAGNOSIS — I248 Other forms of acute ischemic heart disease: Secondary | ICD-10-CM | POA: Diagnosis present

## 2022-03-30 DIAGNOSIS — K219 Gastro-esophageal reflux disease without esophagitis: Secondary | ICD-10-CM | POA: Diagnosis not present

## 2022-03-30 DIAGNOSIS — E854 Organ-limited amyloidosis: Secondary | ICD-10-CM | POA: Diagnosis present

## 2022-03-30 DIAGNOSIS — N1832 Chronic kidney disease, stage 3b: Secondary | ICD-10-CM | POA: Diagnosis present

## 2022-03-30 DIAGNOSIS — Z7982 Long term (current) use of aspirin: Secondary | ICD-10-CM

## 2022-03-30 DIAGNOSIS — R0609 Other forms of dyspnea: Secondary | ICD-10-CM | POA: Diagnosis not present

## 2022-03-30 DIAGNOSIS — I6521 Occlusion and stenosis of right carotid artery: Secondary | ICD-10-CM | POA: Diagnosis present

## 2022-03-30 DIAGNOSIS — M109 Gout, unspecified: Secondary | ICD-10-CM | POA: Diagnosis present

## 2022-03-30 DIAGNOSIS — D631 Anemia in chronic kidney disease: Secondary | ICD-10-CM | POA: Diagnosis present

## 2022-03-30 DIAGNOSIS — R079 Chest pain, unspecified: Secondary | ICD-10-CM | POA: Diagnosis not present

## 2022-03-30 DIAGNOSIS — Z6827 Body mass index (BMI) 27.0-27.9, adult: Secondary | ICD-10-CM | POA: Diagnosis not present

## 2022-03-30 DIAGNOSIS — I130 Hypertensive heart and chronic kidney disease with heart failure and stage 1 through stage 4 chronic kidney disease, or unspecified chronic kidney disease: Secondary | ICD-10-CM | POA: Diagnosis not present

## 2022-03-30 DIAGNOSIS — Z8673 Personal history of transient ischemic attack (TIA), and cerebral infarction without residual deficits: Secondary | ICD-10-CM

## 2022-03-30 DIAGNOSIS — Z6829 Body mass index (BMI) 29.0-29.9, adult: Secondary | ICD-10-CM

## 2022-03-30 DIAGNOSIS — Z9884 Bariatric surgery status: Secondary | ICD-10-CM

## 2022-03-30 DIAGNOSIS — Z888 Allergy status to other drugs, medicaments and biological substances status: Secondary | ICD-10-CM | POA: Diagnosis not present

## 2022-03-30 DIAGNOSIS — G5603 Carpal tunnel syndrome, bilateral upper limbs: Secondary | ICD-10-CM | POA: Diagnosis present

## 2022-03-30 DIAGNOSIS — I509 Heart failure, unspecified: Secondary | ICD-10-CM | POA: Diagnosis not present

## 2022-03-30 DIAGNOSIS — I43 Cardiomyopathy in diseases classified elsewhere: Secondary | ICD-10-CM | POA: Diagnosis present

## 2022-03-30 DIAGNOSIS — I110 Hypertensive heart disease with heart failure: Secondary | ICD-10-CM | POA: Diagnosis not present

## 2022-03-30 DIAGNOSIS — G4700 Insomnia, unspecified: Secondary | ICD-10-CM | POA: Diagnosis present

## 2022-03-30 DIAGNOSIS — R5383 Other fatigue: Secondary | ICD-10-CM

## 2022-03-30 DIAGNOSIS — G909 Disorder of the autonomic nervous system, unspecified: Secondary | ICD-10-CM | POA: Diagnosis present

## 2022-03-30 DIAGNOSIS — R5381 Other malaise: Secondary | ICD-10-CM | POA: Diagnosis not present

## 2022-03-30 DIAGNOSIS — J90 Pleural effusion, not elsewhere classified: Secondary | ICD-10-CM | POA: Diagnosis not present

## 2022-03-30 DIAGNOSIS — I951 Orthostatic hypotension: Secondary | ICD-10-CM | POA: Diagnosis present

## 2022-03-30 DIAGNOSIS — I700 Atherosclerosis of aorta: Secondary | ICD-10-CM | POA: Diagnosis present

## 2022-03-30 DIAGNOSIS — Z8249 Family history of ischemic heart disease and other diseases of the circulatory system: Secondary | ICD-10-CM | POA: Diagnosis not present

## 2022-03-30 DIAGNOSIS — J9811 Atelectasis: Secondary | ICD-10-CM | POA: Diagnosis not present

## 2022-03-30 DIAGNOSIS — I959 Hypotension, unspecified: Secondary | ICD-10-CM | POA: Diagnosis not present

## 2022-03-30 DIAGNOSIS — E1122 Type 2 diabetes mellitus with diabetic chronic kidney disease: Secondary | ICD-10-CM | POA: Diagnosis not present

## 2022-03-30 DIAGNOSIS — E559 Vitamin D deficiency, unspecified: Secondary | ICD-10-CM | POA: Diagnosis present

## 2022-03-30 DIAGNOSIS — E860 Dehydration: Secondary | ICD-10-CM | POA: Diagnosis not present

## 2022-03-30 DIAGNOSIS — I5032 Chronic diastolic (congestive) heart failure: Secondary | ICD-10-CM | POA: Diagnosis not present

## 2022-03-30 DIAGNOSIS — G894 Chronic pain syndrome: Secondary | ICD-10-CM | POA: Diagnosis present

## 2022-03-30 DIAGNOSIS — Z800 Family history of malignant neoplasm of digestive organs: Secondary | ICD-10-CM

## 2022-03-30 DIAGNOSIS — N183 Chronic kidney disease, stage 3 unspecified: Secondary | ICD-10-CM | POA: Diagnosis not present

## 2022-03-30 DIAGNOSIS — M1990 Unspecified osteoarthritis, unspecified site: Secondary | ICD-10-CM | POA: Diagnosis present

## 2022-03-30 DIAGNOSIS — Z7952 Long term (current) use of systemic steroids: Secondary | ICD-10-CM | POA: Diagnosis not present

## 2022-03-30 DIAGNOSIS — I469 Cardiac arrest, cause unspecified: Secondary | ICD-10-CM | POA: Diagnosis not present

## 2022-03-30 DIAGNOSIS — Z803 Family history of malignant neoplasm of breast: Secondary | ICD-10-CM

## 2022-03-30 DIAGNOSIS — R0600 Dyspnea, unspecified: Secondary | ICD-10-CM | POA: Diagnosis present

## 2022-03-30 DIAGNOSIS — I5023 Acute on chronic systolic (congestive) heart failure: Secondary | ICD-10-CM | POA: Diagnosis not present

## 2022-03-30 DIAGNOSIS — N1831 Chronic kidney disease, stage 3a: Secondary | ICD-10-CM | POA: Diagnosis not present

## 2022-03-30 DIAGNOSIS — M810 Age-related osteoporosis without current pathological fracture: Secondary | ICD-10-CM | POA: Diagnosis present

## 2022-03-30 DIAGNOSIS — K5901 Slow transit constipation: Secondary | ICD-10-CM | POA: Diagnosis not present

## 2022-03-30 DIAGNOSIS — Z9989 Dependence on other enabling machines and devices: Secondary | ICD-10-CM | POA: Diagnosis not present

## 2022-03-30 DIAGNOSIS — F32A Depression, unspecified: Secondary | ICD-10-CM | POA: Diagnosis not present

## 2022-03-30 DIAGNOSIS — Z7901 Long term (current) use of anticoagulants: Secondary | ICD-10-CM | POA: Diagnosis not present

## 2022-03-30 LAB — BASIC METABOLIC PANEL
Anion gap: 9 (ref 5–15)
BUN: 41 mg/dL — ABNORMAL HIGH (ref 8–23)
CO2: 27 mmol/L (ref 22–32)
Calcium: 9.2 mg/dL (ref 8.9–10.3)
Chloride: 103 mmol/L (ref 98–111)
Creatinine, Ser: 1.59 mg/dL — ABNORMAL HIGH (ref 0.44–1.00)
GFR, Estimated: 34 mL/min — ABNORMAL LOW (ref >60)
Glucose, Bld: 118 mg/dL — ABNORMAL HIGH (ref 70–99)
Potassium: 3.2 mmol/L — ABNORMAL LOW (ref 3.5–5.1)
Sodium: 139 mmol/L (ref 135–145)

## 2022-03-30 LAB — CBC
HCT: 32.2 % — ABNORMAL LOW (ref 36.0–46.0)
Hemoglobin: 10.1 g/dL — ABNORMAL LOW (ref 12.0–15.0)
MCH: 31.9 pg (ref 26.0–34.0)
MCHC: 31.4 g/dL (ref 30.0–36.0)
MCV: 101.6 fL — ABNORMAL HIGH (ref 80.0–100.0)
Platelets: 282 10*3/uL (ref 150–400)
RBC: 3.17 MIL/uL — ABNORMAL LOW (ref 3.87–5.11)
RDW: 14.1 % (ref 11.5–15.5)
WBC: 6 10*3/uL (ref 4.0–10.5)
nRBC: 0 % (ref 0.0–0.2)

## 2022-03-30 LAB — TROPONIN I (HIGH SENSITIVITY)
Troponin I (High Sensitivity): 170 ng/L (ref <18)
Troponin I (High Sensitivity): 159 ng/L (ref <18)

## 2022-03-30 LAB — HEPATIC FUNCTION PANEL
ALT: 15 U/L (ref 0–44)
AST: 17 U/L (ref 15–41)
Albumin: 2.8 g/dL — ABNORMAL LOW (ref 3.5–5.0)
Alkaline Phosphatase: 89 U/L (ref 38–126)
Bilirubin, Direct: <0.1 mg/dL (ref 0.0–0.2)
Total Bilirubin: 0.8 mg/dL (ref 0.3–1.2)
Total Protein: 6 g/dL — ABNORMAL LOW (ref 6.5–8.1)

## 2022-03-30 LAB — BRAIN NATRIURETIC PEPTIDE: B Natriuretic Peptide: 1405.4 pg/mL — ABNORMAL HIGH (ref 0.0–100.0)

## 2022-03-30 IMAGING — DX DG CHEST 2V
2 series · 2 of 2 positions shown · non-contrast
Comparison: CT angiography chest [DATE], chest x-ray [DATE]

CLINICAL DATA: chest pain

EXAM:
CHEST - 2 VIEW

[chest lat]
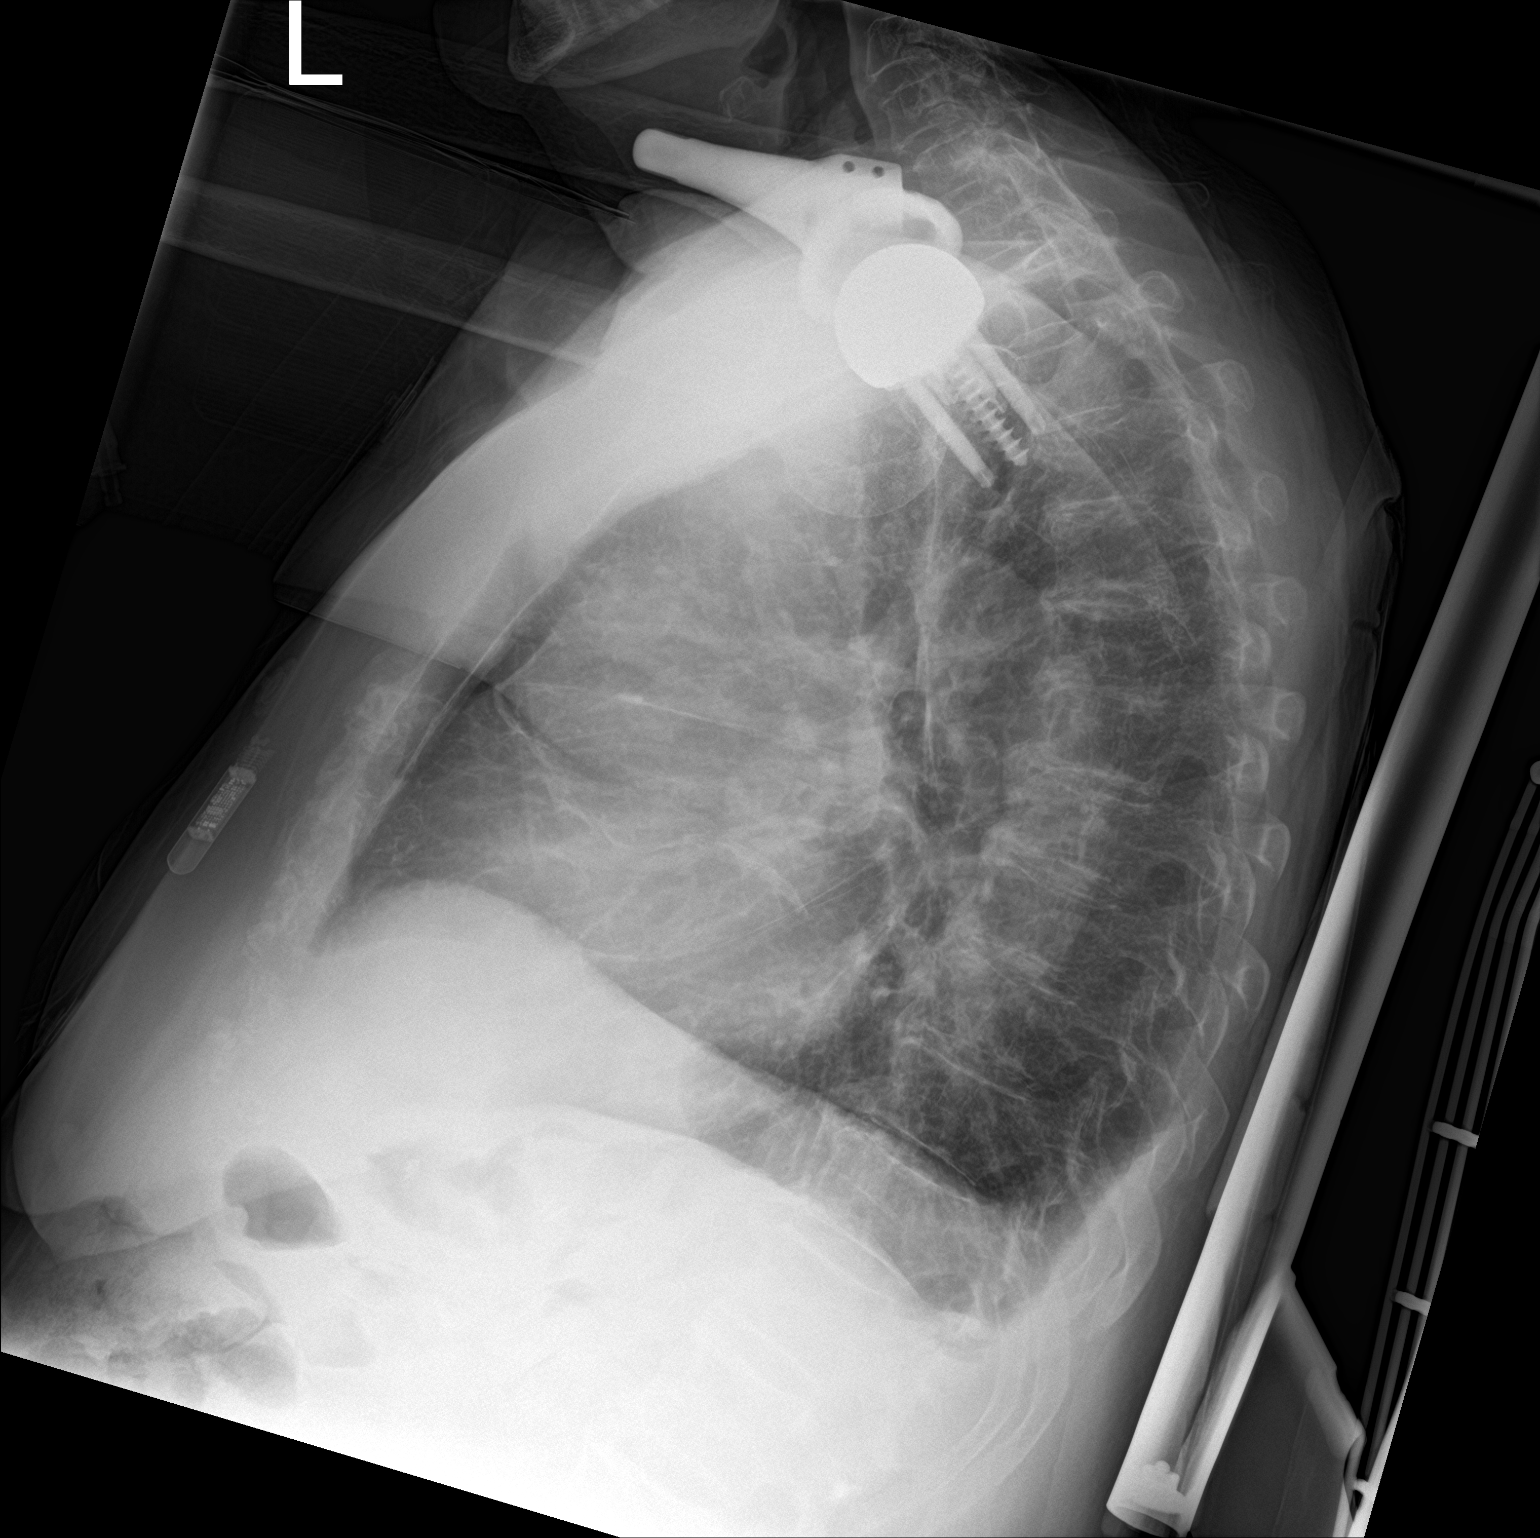

[chest ap]
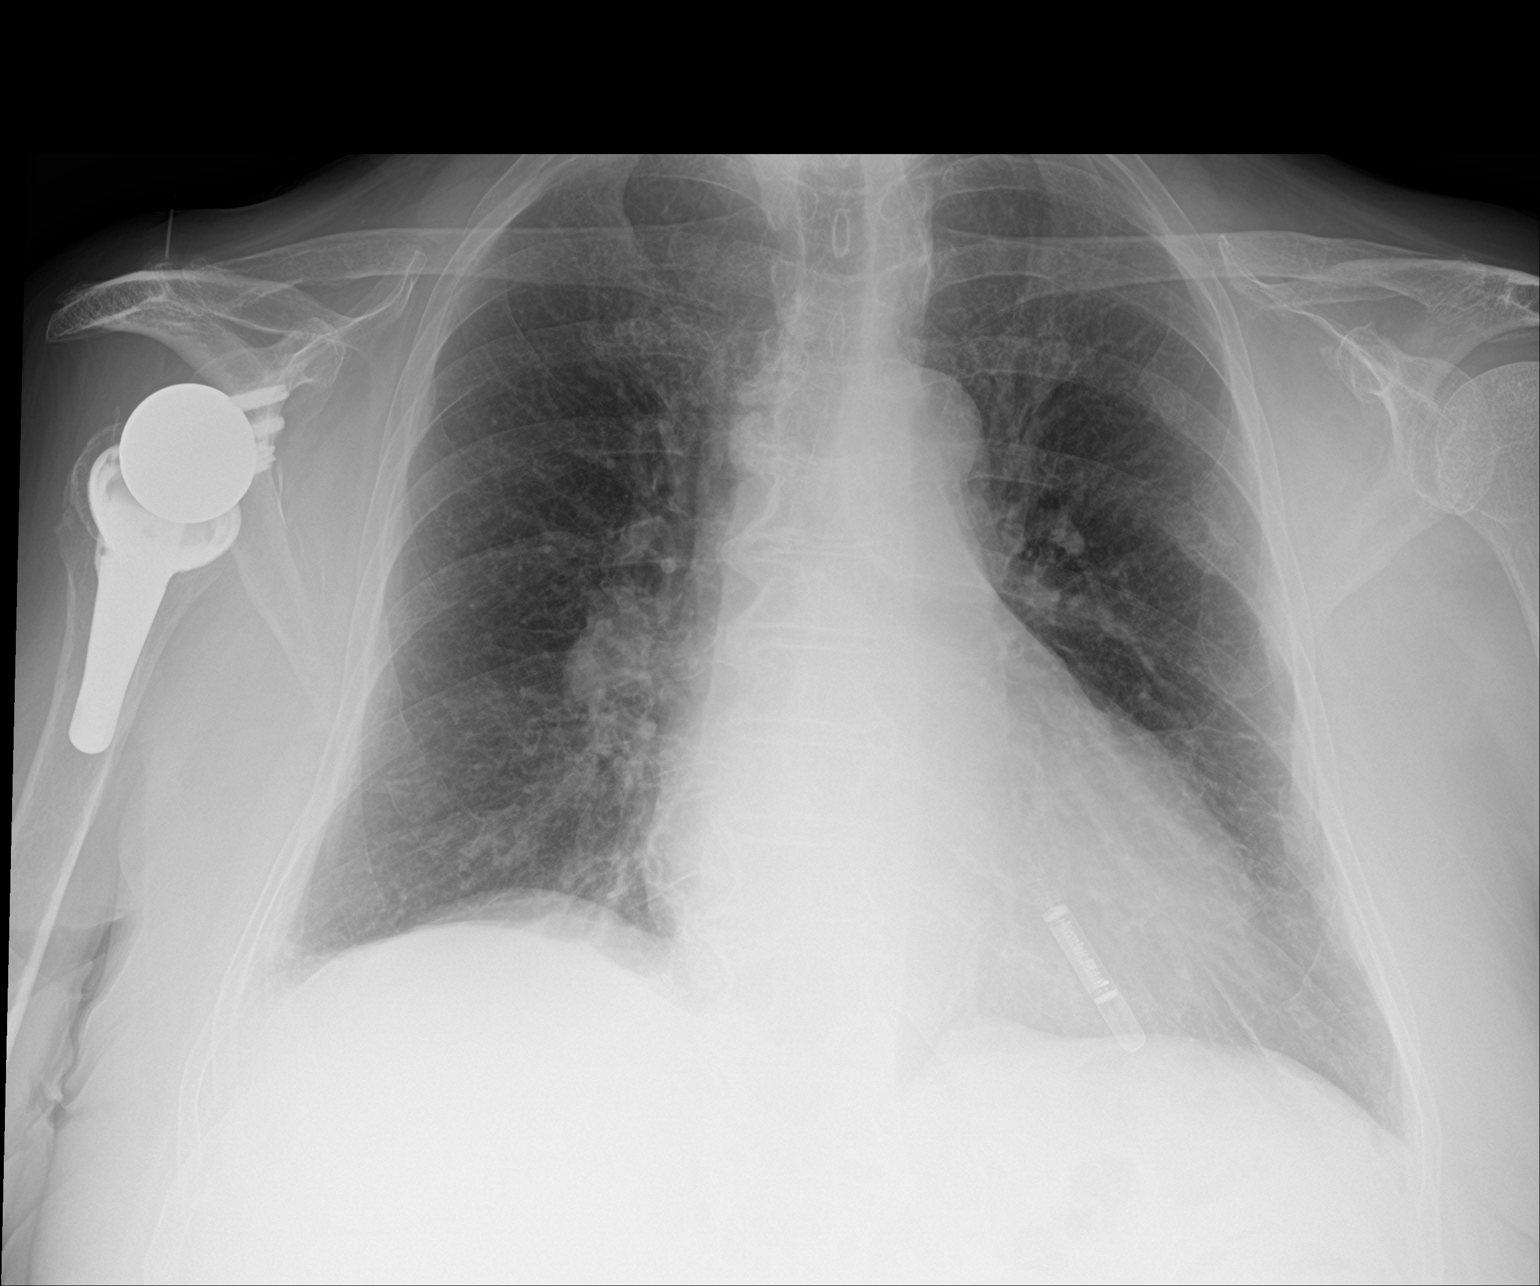

[2 of 2 positions shown; findings below may reference images not displayed]

FINDINGS: Left chest wall wireless cardiac pacemaker.

The heart and mediastinal contours are unchanged. Prominent right
hilar region.

No focal consolidation. Chronic coarsened markings with no overt
pulmonary edema. Bilateral pleural effusions. No pneumothorax.

No acute osseous abnormality. Reversed total right shoulder
arthroplasty.
IMPRESSION: 1. Bilateral pleural effusions.
2. Possible mild pulmonary edema.
3. Prominent right hilar region. Underlying mass lesion or
lymphadenopathy not excluded. Consider CT chest with intravenous
contrast for further evaluation.

## 2022-03-30 IMAGING — CT CT ANGIO CHEST
2 of 6 series · 18 of 36 positions shown · IV contrast (agent unspecified)
Comparison: None.

CLINICAL DATA: Pulmonary embolism suspected, high probability.

EXAM:
CT ANGIOGRAPHY CHEST WITH CONTRAST
TECHNIQUE: Multidetector CT imaging of the chest was performed using the
standard protocol during bolus administration of intravenous
contrast. Multiplanar CT image reconstructions and MIPs were
obtained to evaluate the vascular anatomy.

[Series 7: pe thins · axial · 0.73mm/px · z∈[+1241,+1493]mm · 17 of 400 slices shown]
[im 20/400  lung]
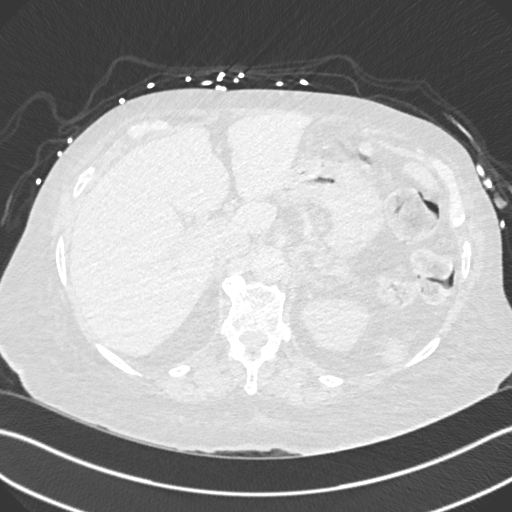
[im 40/400  mediastinal]
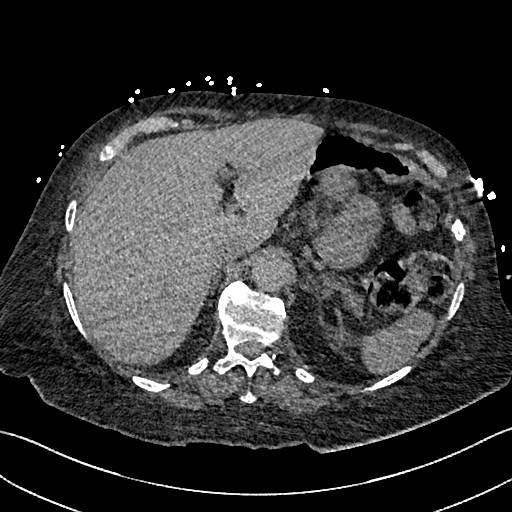
[im 60/400  lung]
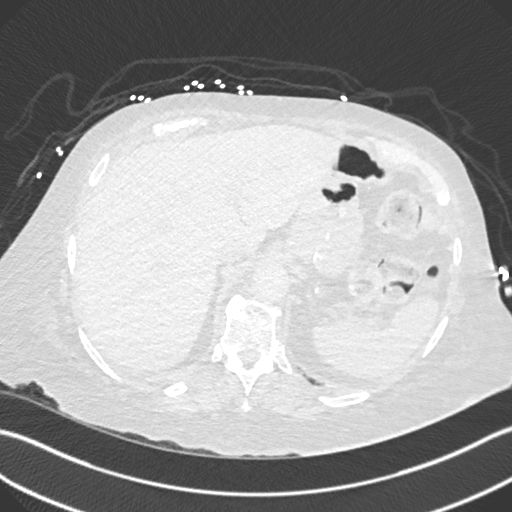
[im 80/400  mediastinal]
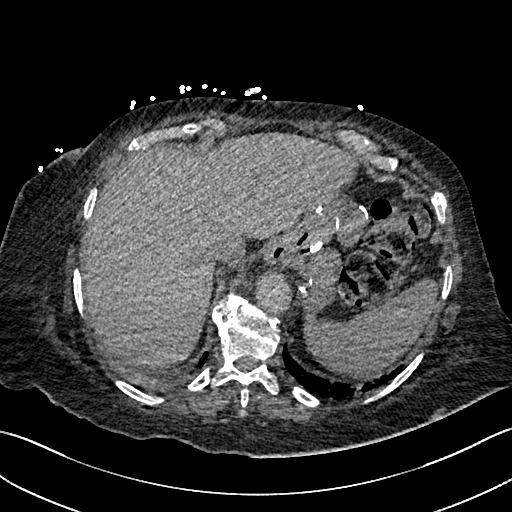
[im 120/400  lung]
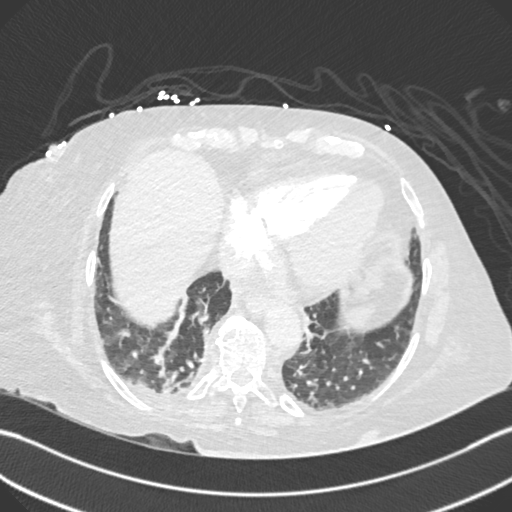
[im 140/400  mediastinal]
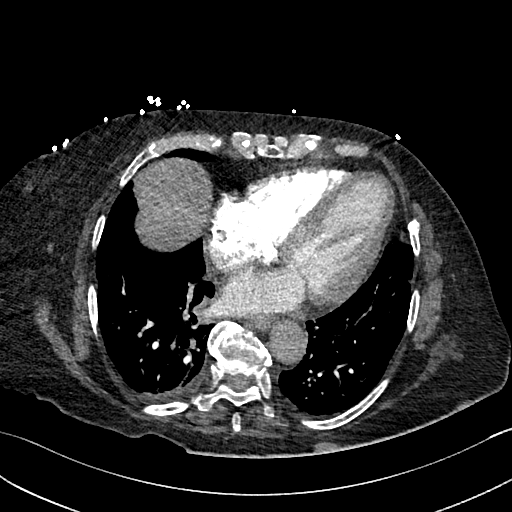
[im 160/400  lung]
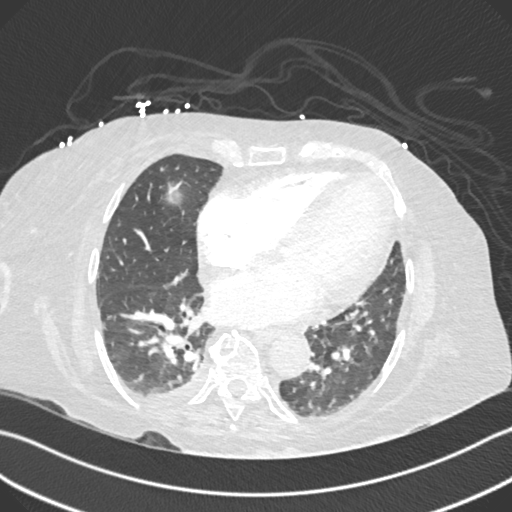
[im 180/400  mediastinal]
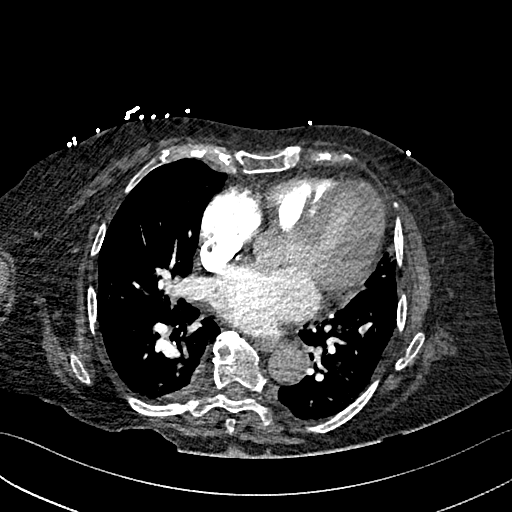
[im 200/400  lung]
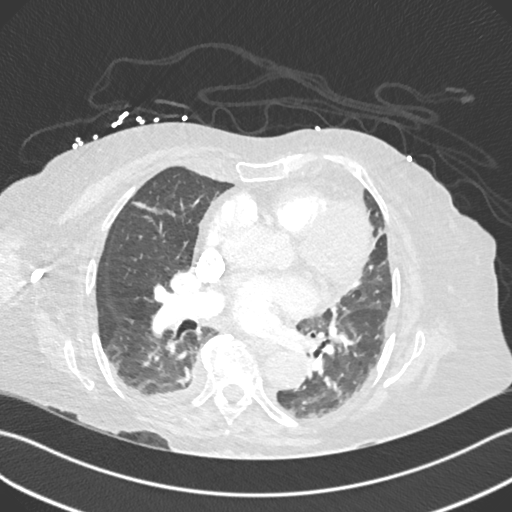
[im 220/400  mediastinal]
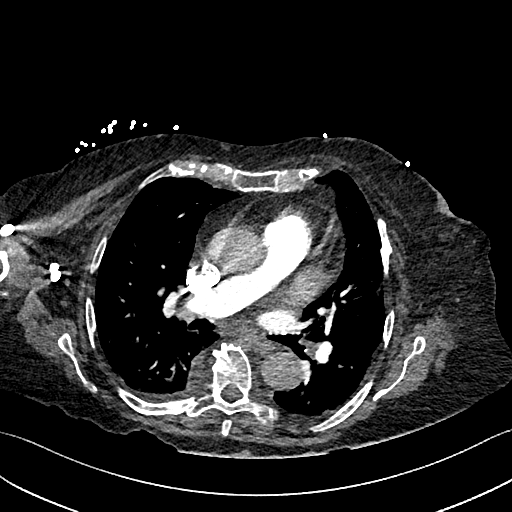
[im 240/400  lung]
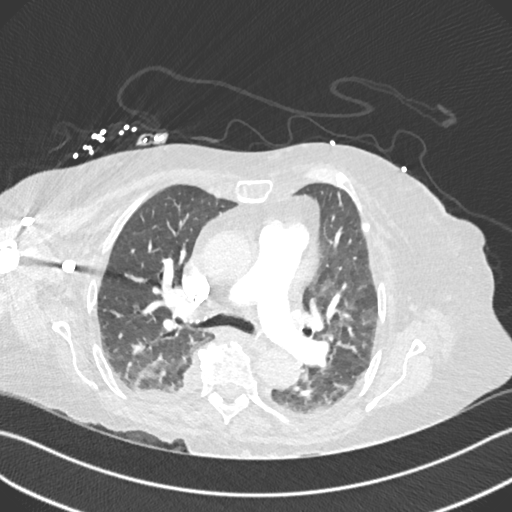
[im 260/400  mediastinal]
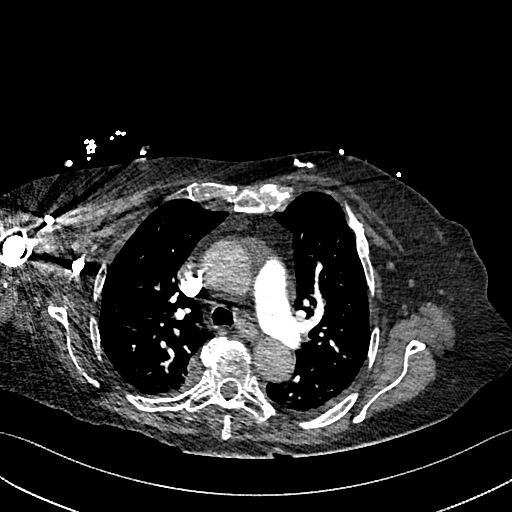
[im 280/400  lung]
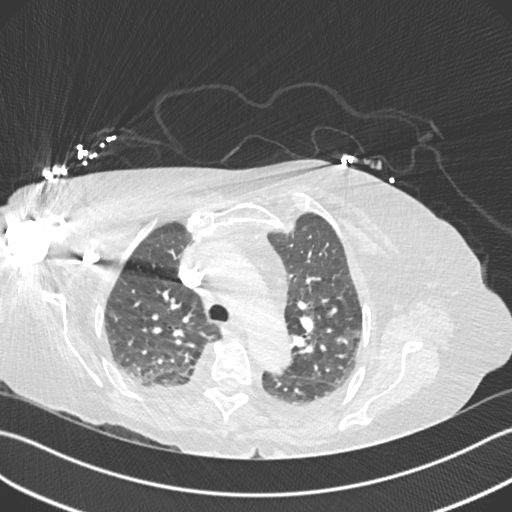
[im 320/400  mediastinal]
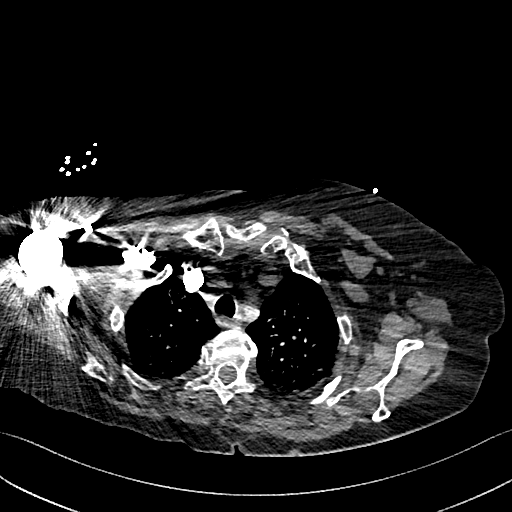
[im 340/400  lung]
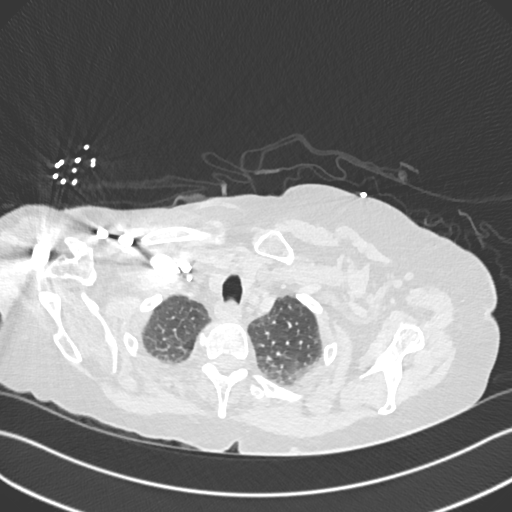
[im 360/400  mediastinal]
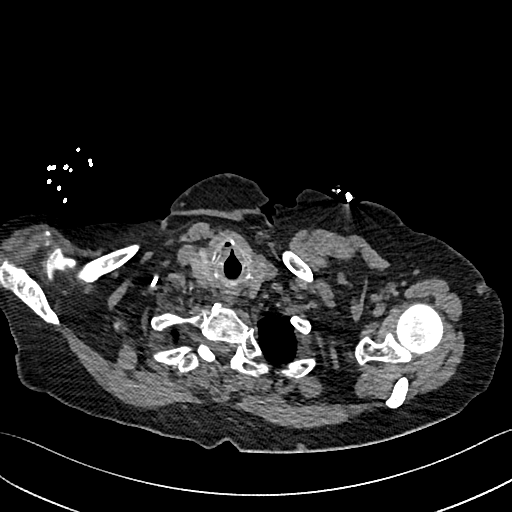
[im 380/400  lung]
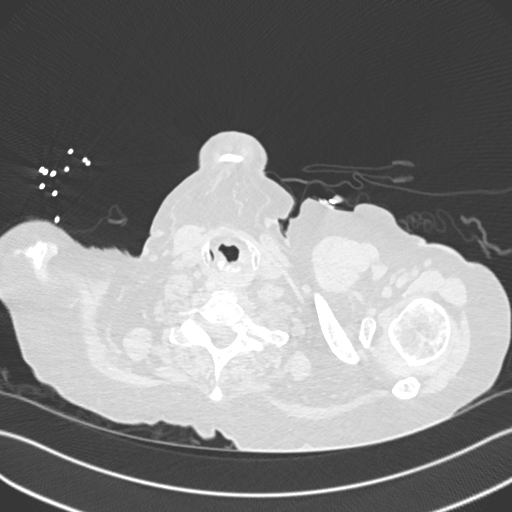

[Series 8: pe 2mm cor · coronal · 0.59mm/px · 1 of 151 slices shown]
[im 76/151  mediastinal]
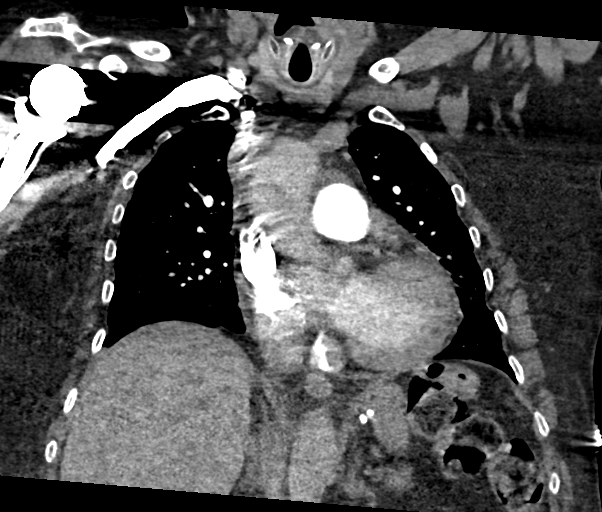

[18 of 36 positions shown; findings below may reference images not displayed]

RADIATION DOSE REDUCTION: This exam was performed according to the
departmental dose-optimization program which includes automated
exposure control, adjustment of the mA and/or kV according to
patient size and/or use of iterative reconstruction technique.

CONTRAST:  65mL OMNIPAQUE IOHEXOL 350 MG/ML SOLN
FINDINGS: Cardiovascular: The heart is enlarged and there is no pericardial
effusion. There is atherosclerotic calcification of the aorta
without evidence of aneurysm. The pulmonary trunk is normal in
caliber. Small segmental and subsegmental pulmonary artery filling
defects are present in the left lower lobe and right upper lobe.
There is no evidence of right heart strain.

Mediastinum/Nodes: No enlarged mediastinal, hilar, or axillary lymph
nodes. Thyroid gland, trachea, and esophagus demonstrate no
significant findings.

Lungs/Pleura: Mild ground-glass opacities are noted bilaterally and
predominant at the lung bases. Atelectasis is noted bilaterally.
There is a trace right pleural effusion. No pneumothorax.

Upper Abdomen: No acute abnormality.

Musculoskeletal: Right shoulder arthroplasty changes are noted.
Degenerative changes are present in the thoracic spine. Old healed
rib fractures are noted on the left. No acute osseous abnormality.

Review of the MIP images confirms the above findings.
IMPRESSION: 1. Segmental and subsegmental pulmonary artery filling defects in
the right upper lobe and left lower lobe. No evidence of right heart
strain.
2. Mild ground-glass opacities in the lungs bilaterally, possible
edema.
3. Trace right pleural effusion.
4. Aortic atherosclerosis.

Critical findings were reported to Dr. HREIDAR at [DATE] p.m.

## 2022-03-30 MED ORDER — HYDROCODONE-ACETAMINOPHEN 5-325 MG PO TABS
1.0000 | ORAL_TABLET | Freq: Once | ORAL | Status: AC
  Administered 2022-03-30: 1 via ORAL
  Filled 2022-03-30: qty 1

## 2022-03-30 MED ORDER — POTASSIUM CHLORIDE CRYS ER 20 MEQ PO TBCR
40.0000 meq | EXTENDED_RELEASE_TABLET | Freq: Once | ORAL | Status: AC
Start: 2022-03-30 — End: 2022-03-30
  Administered 2022-03-30: 40 meq via ORAL
  Filled 2022-03-30: qty 2

## 2022-03-30 MED ORDER — IOHEXOL 350 MG/ML SOLN
65.0000 mL | Freq: Once | INTRAVENOUS | Status: AC | PRN
  Administered 2022-03-30: 65 mL via INTRAVENOUS

## 2022-03-30 MED ORDER — FUROSEMIDE 10 MG/ML IJ SOLN
40.0000 mg | Freq: Once | INTRAMUSCULAR | Status: AC
  Administered 2022-03-30: 40 mg via INTRAVENOUS
  Filled 2022-03-30: qty 4

## 2022-03-30 MED ORDER — NITROGLYCERIN 0.4 MG SL SUBL
0.4000 mg | SUBLINGUAL_TABLET | SUBLINGUAL | Status: DC | PRN
Start: 2022-03-30 — End: 2022-04-04
  Administered 2022-03-30: 0.4 mg via SUBLINGUAL
  Filled 2022-03-30: qty 1

## 2022-03-30 MED ORDER — HYDROCODONE-ACETAMINOPHEN 7.5-325 MG PO TABS: 1.0000 | ORAL_TABLET | Freq: Once | ORAL | Status: DC

## 2022-03-30 MED ORDER — ASPIRIN 325 MG PO TABS: 325.0000 mg | ORAL_TABLET | Freq: Every day | ORAL | Status: DC

## 2022-03-30 MED ORDER — HEPARIN (PORCINE) 25000 UT/250ML-% IV SOLN
1500.0000 [IU]/h | INTRAVENOUS | Status: DC
Start: 2022-03-31 — End: 2022-03-31
  Administered 2022-03-31: 1300 [IU]/h via INTRAVENOUS
  Administered 2022-03-31: 1500 [IU]/h via INTRAVENOUS
  Filled 2022-03-30 (×2): qty 250

## 2022-03-30 MED ORDER — HEPARIN BOLUS VIA INFUSION
4000.0000 [IU] | Freq: Once | INTRAVENOUS | Status: AC
  Administered 2022-03-31: 4000 [IU] via INTRAVENOUS
  Filled 2022-03-30: qty 4000

## 2022-03-30 NOTE — ED Provider Triage Note (Signed)
Emergency Medicine Provider Triage Evaluation Note ? ?[name] , a 76 y.o. female  was evaluated in triage.  Pt complains of chest pain. ?She has also had significant extremity edema.  This is asymmetrical in her legs with her left side being significantly larger.  She reached out to cardiology who recommended that she come in to the ER. ? ? ? ?Physical Exam  ?BP 125/64   Pulse 100   Temp 98.2 ?F (36.8 ?C) (Oral)   Resp 18   SpO2 99%  ?Gen:   Awake, no distress   ?Resp:  Normal effort  ?MSK:   Moves extremities without difficulty  ?Other:  Left leg is larger than right.  Normal speech.  ? ?Medical Decision Making  ?Medically screening exam initiated at 5:53 PM.  Appropriate orders placed.  [name] was informed that the remainder of the evaluation will be completed by another provider, this initial triage assessment does not replace that evaluation, and the importance of remaining in the ED until their evaluation is complete. ? ? ?  ?Lorin Glass, [place] ?03/30/22 1757 ? ?

## 2022-03-30 NOTE — ED Triage Notes (Signed)
Patient complains of chest pain that started last night and got slightly worse and more constant today. Pain is described a feeling like "something is sitting on my chest and a rod is stuck through my heart". Patient also complains of increased edema in lower and upper extremities over the last week. Patient is alert, oriented ,speaking in complete sentences and is in no apparent distress at this time. SpO2 99% on room air. ?

## 2022-03-30 NOTE — ED Notes (Signed)
Patient transported to CT 

## 2022-03-30 NOTE — ED Provider Notes (Signed)
?[facility] ?Provider Note ? ? ?CSN: [id number] ?Arrival date & time: 03/30/22  1711 ? ?  ? ?History ? ?Chief Complaint  ?Patient presents with  ? Chest Pain  ? ? ?[name] is a 76 y.o. female. ? ?The history is provided by the patient. No language interpreter was used.  ?Chest Pain ?Associated symptoms: shortness of breath   ?Associated symptoms: no abdominal pain, no fever, no nausea, no palpitations, no vomiting and no weakness   ? ?76 year old female with past medical history of stroke status post linq procedure, CHF, OSA, pulmonary hypertension, gastric bypass surgery, peripheral neuropathy who presents today for evaluation of worsening peripheral edema, and chest pain.  Describes chest pain as left-sided pressure she states over the past week and a half she has had significant progressively worsening peripheral edema and since yesterday she has had chest pain which has been constant since last night.  ? ?Home Medications ?Prior to Admission medications   ?Medication Sig Start Date End Date Taking? Authorizing Provider  ?acetaminophen (TYLENOL) 325 MG tablet Take 2 tablets (650 mg total) by mouth every 4 (four) hours as needed for headache or mild pain. 02/27/22   Bary Leriche, PA-C  ?ASPIRIN 81 PO Take 81 mg by mouth at bedtime.    [provider]  ?benzocaine (ORAJEL) 10 % mucosal gel Use as directed in the mouth or throat 4 (four) times daily as needed for mouth pain. 02/27/22   Love, Ivan Anchors, PA-C  ?calcium carbonate (OS-CAL) 1250 (500 CA) MG chewable tablet Chew 1 tablet by mouth daily. Not sure of dosage ?Patient not taking: Reported on 03/18/2022    [provider]  ?DULoxetine (CYMBALTA) 60 MG capsule Take 60 mg by mouth 2 (two) times daily. 08/06/16   [provider]  ?esomeprazole (NEXIUM) 40 MG capsule Take 40 mg by mouth 2 (two) times daily before a meal.  12/20/14   [provider]  ?flavoxATE (URISPAS) 100 MG tablet Take 1  tablet (100 mg total) by mouth 3 (three) times daily as needed for bladder spasms (please offer to pt- cannot use pyridium due to kidney fxn). 03/03/22   Love, Ivan Anchors, PA-C  ?gabapentin (NEURONTIN) 100 MG capsule Take 1 capsule (100 mg total) by mouth 3 (three) times daily. 03/04/22   Love, Ivan Anchors, PA-C  ?HYDROcodone-acetaminophen (NORCO) 7.5-325 MG tablet Take 1 tablet by mouth 4 (four) times daily. 03/03/22   Love, Ivan Anchors, PA-C  ?meclizine (ANTIVERT) 12.5 MG tablet Take 1 tablet (12.5 mg total) by mouth 3 (three) times daily. 03/03/22   Love, Ivan Anchors, PA-C  ?melatonin 5 MG TABS Take 1 tablet (5 mg total) by mouth at bedtime. 02/27/22   Love, Ivan Anchors, PA-C  ?midodrine (PROAMATINE) 10 MG tablet Take 1 tablet (10 mg total) by mouth 3 (three) times daily. 03/18/22   Rafael Bihari, FNP  ?ondansetron (ZOFRAN-ODT) 4 MG disintegrating tablet DISSOLVE 1 TABLET(4 MG) ON THE TONGUE EVERY 8 HOURS AS NEEDED FOR NAUSEA OR VOMITING ?Patient taking differently: Take 4 mg by mouth every 8 (eight) hours as needed for nausea. 11/20/20   Melvenia Beam, MD  ?PROAIR HFA 108 [phone] Base) MCG/ACT inhaler Inhale 1 puff into the lungs every 6 (six) hours as needed for shortness of breath. 11/26/15   [provider]  ?rOPINIRole (REQUIP) 1 MG tablet Take 2 tablets (2 mg total) by mouth at bedtime. 03/18/22   Rafael Bihari, FNP  ?senna (SENOKOT)  8.6 MG TABS tablet Take 1 tablet (8.6 mg total) by mouth daily. 02/16/22   Domenic Polite, MD  ?torsemide (DEMADEX) 20 MG tablet Take 1 tablet (20 mg total) by mouth daily. Starting Thursday 03/18/22   Rafael Bihari, FNP  ?triamcinolone ointment (KENALOG) 0.1 % Apply topically 2 (two) times daily. 01/30/22   [provider]  ?Vitamin D, Ergocalciferol, (DRISDOL) 1.25 MG (50000 UNIT) CAPS capsule Take 1 capsule (50,000 Units total) by mouth every 7 (seven) days. 06/10/21   Dohmeier, Asencion Partridge, MD  ?   ? ?Allergies    ?Atorvastatin, Crestor [rosuvastatin], Ezetimibe, and  Amitriptyline   ? ?Review of Systems   ?Review of Systems  ?Constitutional:  Negative for fever.  ?Respiratory:  Positive for shortness of breath.   ?Cardiovascular:  Positive for chest pain and leg swelling. Negative for palpitations.  ?Gastrointestinal:  Negative for abdominal pain, nausea and vomiting.  ?Neurological:  Negative for syncope, weakness and light-headedness.  ?All other systems reviewed and are negative. ? ?Physical Exam ?Updated Vital Signs ?BP 118/66   Pulse (!) 101   Temp 98 ?F (36.7 ?C) (Oral)   Resp 14   SpO2 98%  ?Physical Exam ?Vitals and nursing note reviewed.  ?Constitutional:   ?   General: She is not in acute distress. ?   Appearance: Normal appearance. She is well-developed. She is not ill-appearing.  ?HENT:  ?   Head: Normocephalic and atraumatic.  ?   Nose: Nose normal.  ?Eyes:  ?   General: No scleral icterus. ?   Extraocular Movements: Extraocular movements intact.  ?   Conjunctiva/sclera: Conjunctivae normal.  ?Cardiovascular:  ?   Rate and Rhythm: Normal rate and regular rhythm.  ?   Pulses: Normal pulses.  ?   Heart sounds: Normal heart sounds.  ?Pulmonary:  ?   Effort: Pulmonary effort is normal. No respiratory distress.  ?   Breath sounds: Rales (Bibasilar rales) present. No wheezing.  ?Abdominal:  ?   General: There is no distension.  ?   Palpations: Abdomen is soft.  ?   Tenderness: There is no abdominal tenderness. There is no guarding.  ?Musculoskeletal:     ?   General: Normal range of motion.  ?   Cervical back: Normal range of motion.  ?   Right lower leg: Edema present.  ?   Left lower leg: Edema present.  ?Skin: ?   General: Skin is warm and dry.  ?   Comments: Multiple lower extremity wounds.  Dressings in place.  Dressings removed and wounds evaluated.  Without evidence of acute cellulitis.  ?Neurological:  ?   General: No focal deficit present.  ?   Mental Status: She is alert. Mental status is at baseline.  ? ? ?ED Results / Procedures / Treatments   ?Labs ?(all  labs ordered are listed, but only abnormal results are displayed) ?Labs Reviewed  ?BASIC METABOLIC PANEL - Abnormal; Notable for the following components:  ?    Result Value  ? Potassium 3.2 (*)   ? Glucose, Bld 118 (*)   ? BUN 41 (*)   ? Creatinine, Ser 1.59 (*)   ? GFR, Estimated 34 (*)   ? All other components within normal limits  ?CBC - Abnormal; Notable for the following components:  ? RBC 3.17 (*)   ? Hemoglobin 10.1 (*)   ? HCT 32.2 (*)   ? MCV 101.6 (*)   ? All other components within normal limits  ?TROPONIN I (HIGH  SENSITIVITY) - Abnormal; Notable for the following components:  ? Troponin I (High Sensitivity) 170 (*)   ? All other components within normal limits  ?BRAIN NATRIURETIC PEPTIDE  ?HEPATIC FUNCTION PANEL  ?TROPONIN I (HIGH SENSITIVITY)  ? ? ?EKG ?None ? ?Radiology ?DG Chest 2 View ? ?Result Date: 03/30/2022 ?CLINICAL DATA:  chest pain EXAM: CHEST - 2 VIEW COMPARISON:  CT angiography chest 02/04/2022, chest x-ray 02/04/2022 FINDINGS: Left chest wall wireless cardiac pacemaker. The heart and mediastinal contours are unchanged. Prominent right hilar region. No focal consolidation. Chronic coarsened markings with no overt pulmonary edema. Bilateral pleural effusions. No pneumothorax. No acute osseous abnormality. Reversed total right shoulder arthroplasty. IMPRESSION: 1. Bilateral pleural effusions. 2. Possible mild pulmonary edema. 3. Prominent right hilar region. Underlying mass lesion or lymphadenopathy not excluded. Consider CT chest with intravenous contrast for further evaluation. Electronically Signed   By: Iven Finn M.D.   On: 03/30/2022 19:10   ? ?Procedures ?Procedures  ? ? ?Medications Ordered in ED ?Medications  ?HYDROcodone-acetaminophen (NORCO) 7.5-325 MG per tablet 1 tablet (has no administration in time range)  ? ? ?ED Course/ Medical Decision Making/ A&P ?  ?                        ?Medical Decision Making ?Amount and/or Complexity of Data Reviewed ?Labs: ordered. ?Radiology:  ordered. ? ?Risk ?OTC drugs. ?Prescription drug management. ? ? ?Medical Decision Making / ED Course ? ? ?This patient presents to the ED for concern of Chest pain, shortness of breath, this involves an extensive n

## 2022-03-30 NOTE — Progress Notes (Signed)
ANTICOAGULATION CONSULT NOTE - Initial Consult ? ?Pharmacy Consult for heparin ?Indication: pulmonary embolus ? ?Allergies  ?Allergen Reactions  ? Atorvastatin   ?  Other reaction(s): myalgia  ? Crestor [Rosuvastatin]   ?  Other reaction(s): myalgia  ? Ezetimibe   ?  Other reaction(s): myalgia  ? Amitriptyline Rash  ? ? ?Patient Measurements: ?Height: '5\' 7"'$  (170.2 cm) ?Weight: 85.3 kg (188 lb 0.8 oz) ?IBW/kg (Calculated) : 61.6 ?Heparin Dosing Weight: 80kg ? ?Vital Signs: ?Temp: 98 ?F (36.7 ?C) (04/16 2002) ?Temp Source: Oral (04/16 2002) ?BP: 127/69 (04/16 2330) ?Pulse Rate: 105 (04/16 2330) ? ?Labs: ?Recent Labs  ?  03/30/22 ?5573 03/30/22 ?2134  ?HGB 10.1*  --   ?HCT 32.2*  --   ?PLT 282  --   ?CREATININE 1.59*  --   ?TROPONINIHS 170* 159*  ? ? ?Estimated Creatinine Clearance: 34.8 mL/min (A) (by C-G formula based on SCr of 1.59 mg/dL (H)). ? ? ?Medical History: ?Past Medical History:  ?Diagnosis Date  ? Anemia   ? unable to absorb iron after gastric bypass  ? Arthritis   ? generalized  ? Asthma   ? Atrophic vaginitis   ? Back pain   ? DDD/stenosis  ? Carotid stenosis   ? Carotid US 10/16: Plaque RICA (2-20%), normal LICA  ? Depression   ? takes Cymbalta daily  ? Diverticulosis   ? benign  ? DJD (degenerative joint disease)   ? Dyslipidemia   ? Dysrhythmia   ? Family history of GI bleeding   ? GERD (gastroesophageal reflux disease)   ? takes Nexium daily  ? Gestational diabetes   ? Pt denies  ? H/O hiatal hernia   ? surgery for hernia  ? Headache(784.0)   ? takes Imitrex daily as needed and Bisoprolol daily;last migraine was about 2wks ago  ? History of bronchitis 1 yr ago  ? History of shingles   ? Insomnia   ? takes Ambien nightly  ? Joint pain   ? Joint swelling   ? Leg cramps   ? takes Flexeril daily as needed  ? 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 Nocturia   ? Osteoporosis   ? Peripheral neuropathy   ? takes Gabapentin daily  ? Pneumonia 19yr ago  ? hx of  ? PONV (postoperative nausea and vomiting)   ?  Restless leg syndrome   ? takes Requip daily  ? RLS (restless legs syndrome) 08/16/2015  ? Sleep apnea   ? uses CPAP  ? Stroke ([facility]   ? Tubular adenoma of colon   ? Vertigo   ? Vitamin D deficiency   ? ? ?Assessment: ?89yofemale c/o CP w/ SOB, CT reveals pulmonary artery filling defects in RUL/LLL without RHS >> to start heparin. ? ?Goal of Therapy:  ?Heparin level 0.3-0.7 units/ml ?Monitor platelets by anticoagulation protocol: Yes ?  ?Plan:  ?Heparin 4000 units IV bolus x1 followed by infusion at 1300 units/hr and monitor heparin levels and CBC. ? ?VWynona Neat PharmD, BCPS  ?03/30/2022,11:45 PM ? ? ?

## 2022-03-30 NOTE — Telephone Encounter (Signed)
? ?  Che with [facility] called answering service with concerns about patient having chest pain and edema.  Called and spoke with Che.  She states patient was discharged from [facility] yesterday.  She is now complaining of 3-5/10 chest pain and has significant lower extremity edema as well as upper extremity edema and worsening shortness of breath.  Patient states she reportedly started gaining a lot of weight at [facility] about a week ago and states staff there was supposed to give [place] a call but "never got around to it."  Donney Dice is familiar with this patient and states this is the most significant edema that she has seen the patient have.  Given active chest pain and what sounds like significant volume overload, recommended going to the ED for further evaluation and likely IV diuresis.  Diuresis and GDMT has been limited in the past due to orthostatic hypotension so do not feel comfortable trying to do this as an outpatient.   Che and patient voiced understanding and agreed. ? ?Darreld Mclean, PA-C ?03/30/2022 4:31 PM ? ? ?

## 2022-03-30 NOTE — H&P (Signed)
?History and Physical  ? ? ?[name] YQM:[id number] DOB: 11-03-1946 DOA: 03/30/2022 ? ?PCP: [name], DO  ?Patient coming from: Home ? ?I have personally briefly reviewed patient's old medical records in [place] ? ?Chief Complaint: Shortness of breath ? ?HPI: ?[name] is a 76 y.o. female with medical history significant for HFpEF (EF 55-60%, G2DD by TTE 02/04/2022), pulmonary hypertension, history of cryptogenic stroke, CKD stage IIIb, orthostatic hypotension, HLD, anemia of chronic disease, history of gastric bypass, RLS, depression, and OSA on CPAP who presented to the ED for evaluation of shortness of breath. ? ?Patient recently hospitalized 02/03/2022-02/15/2022 for acute on chronic HFpEF exacerbation.  Cardiac amyloidosis work-up was equivocal with cMRI however PYP scan not suggestive.  She was diuresed with IV Lasix, 7 L negative.  Plan was for repeat PYP scan in 4-5 months.  Hospitalization was complicated by AKI on CKD stage III and orthostatic hypotension.  Midodrine was added for BP support.  Patient ultimately discharged to [facility] where she stayed until she was discharged to SNF on 03/04/2022. ? ?Patient states that she returned home from SNF on 4/15.  She says over the past week she has been having increasing swelling to her lower extremities.  She has also been experiencing progressive shortness of breath with orthopnea, PND, DOE.  She has had some central chest discomfort which radiated down.  She reports good urine output without dysuria.  She has had some nausea without emesis.  She denies any abdominal pain.  She states that she normally ambulates with the use of a walker but has not been able to do so these last 2 days. ? ?ED Course  Labs/Imaging on admission: I have personally reviewed following labs and imaging studies. ? ?Initial vitals showed BP 125/64, pulse 100, RR 18, temp 98.2 ?F, SPO2 99% on room air. ? ?Labs show WBC 6.0, hemoglobin 10.1, platelets 282,000, sodium 139,  potassium 3.2, bicarb 27, BUN 41, creatinine 1.59, serum glucose 118, BNP 1405.4, troponin 170 > 159. ? ?2 view chest x-ray shows bilateral pleural effusions, pulmonary edema, prominent right hilar region. ? ?CTA chest PE study shows segmental and subsegmental pulmonary artery filling defects in the right upper lobe and left lower lobe without evidence of right heart strain.  Mild groundglass opacities in the lungs bilaterally and trace right pleural effusion seen.  Aortic atherosclerosis noted. ? ?Patient was given IV Lasix 40 mg, oral K mEq x1, and started on IV heparin.  EDP discussed with on-call cardiology who recommended medical admission and they will see in consultation.  The hospitalist service was consulted to admit for further evaluation and management. ? ?Review of Systems: All systems reviewed and are negative except as documented in history of present illness above. ? ? ?Past Medical History:  ?Diagnosis Date  ? Anemia   ? unable to absorb iron after gastric bypass  ? Arthritis   ? generalized  ? Asthma   ? Atrophic vaginitis   ? Back pain   ? DDD/stenosis  ? Carotid stenosis   ? Carotid US 10/16: Plaque RICA (5-28%), normal LICA  ? Depression   ? takes Cymbalta daily  ? Diverticulosis   ? benign  ? DJD (degenerative joint disease)   ? Dyslipidemia   ? Dysrhythmia   ? Family history of GI bleeding   ? GERD (gastroesophageal reflux disease)   ? takes Nexium daily  ? Gestational diabetes   ? Pt denies  ? H/O hiatal hernia   ?  surgery for hernia  ? Headache(784.0)   ? takes Imitrex daily as needed and Bisoprolol daily;last migraine was about 2wks ago  ? History of bronchitis 1 yr ago  ? History of shingles   ? Insomnia   ? takes Ambien nightly  ? Joint pain   ? Joint swelling   ? Leg cramps   ? takes Flexeril daily as needed  ? 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 Nocturia   ? Osteoporosis   ? Peripheral neuropathy   ? takes Gabapentin daily  ? Pneumonia 35yr ago  ? hx of  ? PONV (postoperative nausea and  vomiting)   ? Restless leg syndrome   ? takes Requip daily  ? RLS (restless legs syndrome) 08/16/2015  ? Sleep apnea   ? uses CPAP  ? Stroke ([facility]   ? Tubular adenoma of colon   ? Vertigo   ? Vitamin D deficiency   ? ? ?Past Surgical History:  ?Procedure Laterality Date  ? COLONOSCOPY    ? EP IMPLANTABLE DEVICE N/A 11/19/2016  ? Procedure: Loop Recorder Insertion;  Surgeon: SDeboraha Sprang MD;  [facility];  Service: Cardiovascular;  Laterality: N/A;  ? ESOPHAGOGASTRODUODENOSCOPY    ? excess skin removal    ? post weight loss  ? GASTRIC BYPASS  2001  ? HERNIA REPAIR    ? umbilical  ? JOINT REPLACEMENT Right   ? knee  ? REVERSE SHOULDER ARTHROPLASTY Right 04/25/2021  ? Procedure: REVERSE SHOULDER ARTHROPLASTY carpal tunel realase right wrist;  Surgeon: CTania Ade MD;  Location: [facility];  Service: Orthopedics;  Laterality: Right;  ? RIGHT HEART CATH N/A 02/06/2022  ? Procedure: RIGHT HEART CATH;  Surgeon: SBelva Crome MD;  [facility];  Service: Cardiovascular;  Laterality: N/A;  ? skin reduction  2003 2004  ? stomach and arm  ? STEROID INJECTION TO SCAR    ? x 2 in back   ? TOTAL KNEE ARTHROPLASTY Left 04/03/2015  ? Procedure: LEFT TOTAL KNEE ARTHROPLASTY;  Surgeon: PMelrose Nakayama MD;  [facility]  Service: Orthopedics;  Laterality: Left;  ? WISDOM TOOTH EXTRACTION    ? ? ?Social History: ? reports that she has never smoked. She has never used smokeless tobacco. She reports current alcohol use. She reports that she does not use drugs. ? ?Allergies  ?Allergen Reactions  ? Atorvastatin   ?  Other reaction(s): myalgia  ? Crestor [Rosuvastatin]   ?  Other reaction(s): myalgia  ? Ezetimibe   ?  Other reaction(s): myalgia  ? Amitriptyline Rash  ? ? ?Family History  ?Problem Relation Age of Onset  ? CAD Mother   ? Hypertension Mother   ? Neuropathy Mother   ? Osteoarthritis Mother   ? Heart disease Mother   ? Breast cancer Maternal Grandmother   ? CAD Paternal Grandfather   ? Colon  cancer Father   ? ? ? ?Prior to Admission medications   ?Medication Sig Start Date End Date Taking? Authorizing Provider  ?acetaminophen (TYLENOL) 325 MG tablet Take 2 tablets (650 mg total) by mouth every 4 (four) hours as needed for headache or mild pain. 02/27/22   LBary Leriche PA-C  ?ASPIRIN 81 PO Take 81 mg by mouth at bedtime.    [provider]  ?benzocaine (ORAJEL) 10 % mucosal gel Use as directed in the mouth or throat 4 (four) times daily as needed for mouth pain. 02/27/22   LBary Leriche PA-C  ?  calcium carbonate (OS-CAL) 1250 (500 CA) MG chewable tablet Chew 1 tablet by mouth daily. Not sure of dosage ?Patient not taking: Reported on 03/18/2022    [provider]  ?DULoxetine (CYMBALTA) 60 MG capsule Take 60 mg by mouth 2 (two) times daily. 08/06/16   [provider]  ?esomeprazole (NEXIUM) 40 MG capsule Take 40 mg by mouth 2 (two) times daily before a meal.  12/20/14   [provider]  ?flavoxATE (URISPAS) 100 MG tablet Take 1 tablet (100 mg total) by mouth 3 (three) times daily as needed for bladder spasms (please offer to pt- cannot use pyridium due to kidney fxn). 03/03/22   Love, Ivan Anchors, PA-C  ?gabapentin (NEURONTIN) 100 MG capsule Take 1 capsule (100 mg total) by mouth 3 (three) times daily. 03/04/22   Love, Ivan Anchors, PA-C  ?HYDROcodone-acetaminophen (NORCO) 7.5-325 MG tablet Take 1 tablet by mouth 4 (four) times daily. 03/03/22   Love, Ivan Anchors, PA-C  ?meclizine (ANTIVERT) 12.5 MG tablet Take 1 tablet (12.5 mg total) by mouth 3 (three) times daily. 03/03/22   Love, Ivan Anchors, PA-C  ?melatonin 5 MG TABS Take 1 tablet (5 mg total) by mouth at bedtime. 02/27/22   Love, Ivan Anchors, PA-C  ?midodrine (PROAMATINE) 10 MG tablet Take 1 tablet (10 mg total) by mouth 3 (three) times daily. 03/18/22   Rafael Bihari, FNP  ?ondansetron (ZOFRAN-ODT) 4 MG disintegrating tablet DISSOLVE 1 TABLET(4 MG) ON THE TONGUE EVERY 8 HOURS AS NEEDED FOR NAUSEA OR VOMITING ?Patient taking  differently: Take 4 mg by mouth every 8 (eight) hours as needed for nausea. 11/20/20   Melvenia Beam, MD  ?PROAIR HFA 108 [phone] Base) MCG/ACT inhaler Inhale 1 puff into the lungs every 6 (six) hours as needed for

## 2022-03-31 ENCOUNTER — Other Ambulatory Visit (HOSPITAL_COMMUNITY): Payer: Self-pay

## 2022-03-31 ENCOUNTER — Inpatient Hospital Stay (HOSPITAL_COMMUNITY): Payer: Medicare Other

## 2022-03-31 DIAGNOSIS — G894 Chronic pain syndrome: Secondary | ICD-10-CM | POA: Diagnosis not present

## 2022-03-31 DIAGNOSIS — I2699 Other pulmonary embolism without acute cor pulmonale: Secondary | ICD-10-CM | POA: Diagnosis not present

## 2022-03-31 DIAGNOSIS — I5033 Acute on chronic diastolic (congestive) heart failure: Secondary | ICD-10-CM

## 2022-03-31 DIAGNOSIS — G4733 Obstructive sleep apnea (adult) (pediatric): Secondary | ICD-10-CM

## 2022-03-31 DIAGNOSIS — E876 Hypokalemia: Secondary | ICD-10-CM | POA: Diagnosis present

## 2022-03-31 DIAGNOSIS — R5383 Other fatigue: Secondary | ICD-10-CM

## 2022-03-31 DIAGNOSIS — G2581 Restless legs syndrome: Secondary | ICD-10-CM

## 2022-03-31 DIAGNOSIS — Z9989 Dependence on other enabling machines and devices: Secondary | ICD-10-CM

## 2022-03-31 DIAGNOSIS — R0609 Other forms of dyspnea: Secondary | ICD-10-CM

## 2022-03-31 DIAGNOSIS — Z8673 Personal history of transient ischemic attack (TIA), and cerebral infarction without residual deficits: Secondary | ICD-10-CM

## 2022-03-31 DIAGNOSIS — N1832 Chronic kidney disease, stage 3b: Secondary | ICD-10-CM | POA: Diagnosis not present

## 2022-03-31 DIAGNOSIS — I951 Orthostatic hypotension: Secondary | ICD-10-CM

## 2022-03-31 LAB — ECHOCARDIOGRAM COMPLETE
AR max vel: 1.64 cm2
AV Area VTI: 1.48 cm2
AV Mean grad: 18 mmHg
AV Peak grad: 27 mmHg
Ao pk vel: 2.6 m/s
Area-P 1/2: 5.79 cm2
Height: 67 in
P 1/2 time: 291 msec
S' Lateral: 3.1 cm
Weight: 3008.84 oz

## 2022-03-31 LAB — BASIC METABOLIC PANEL
Anion gap: 10 (ref 5–15)
BUN: 35 mg/dL — ABNORMAL HIGH (ref 8–23)
CO2: 23 mmol/L (ref 22–32)
Calcium: 8.7 mg/dL — ABNORMAL LOW (ref 8.9–10.3)
Chloride: 105 mmol/L (ref 98–111)
Creatinine, Ser: 1.68 mg/dL — ABNORMAL HIGH (ref 0.44–1.00)
GFR, Estimated: 32 mL/min — ABNORMAL LOW (ref >60)
Glucose, Bld: 151 mg/dL — ABNORMAL HIGH (ref 70–99)
Potassium: 3.5 mmol/L (ref 3.5–5.1)
Sodium: 138 mmol/L (ref 135–145)

## 2022-03-31 LAB — MAGNESIUM: Magnesium: 2 mg/dL (ref 1.7–2.4)

## 2022-03-31 LAB — HEPARIN LEVEL (UNFRACTIONATED)
Heparin Unfractionated: 0.28 IU/mL — ABNORMAL LOW (ref 0.30–0.70)
Heparin Unfractionated: >1.1 IU/mL — ABNORMAL HIGH (ref 0.30–0.70)

## 2022-03-31 MED ORDER — APIXABAN 5 MG PO TABS: 5.0000 mg | ORAL_TABLET | Freq: Two times a day (BID) | ORAL | Status: DC

## 2022-03-31 MED ORDER — MORPHINE SULFATE (PF) 2 MG/ML IV SOLN
2.0000 mg | Freq: Once | INTRAVENOUS | Status: AC
  Administered 2022-03-31: 2 mg via INTRAVENOUS
  Filled 2022-03-31: qty 1

## 2022-03-31 MED ORDER — MIDODRINE HCL 5 MG PO TABS
5.0000 mg | ORAL_TABLET | Freq: Three times a day (TID) | ORAL | Status: DC
  Administered 2022-03-31 – 2022-04-01 (×4): 5 mg via ORAL
  Filled 2022-03-31 (×4): qty 1

## 2022-03-31 MED ORDER — ROPINIROLE HCL 1 MG PO TABS
2.0000 mg | ORAL_TABLET | Freq: Every day | ORAL | Status: DC
Start: 2022-03-31 — End: 2022-04-04
  Administered 2022-03-31 – 2022-04-03 (×5): 2 mg via ORAL
  Filled 2022-03-31 (×7): qty 2

## 2022-03-31 MED ORDER — FUROSEMIDE 10 MG/ML IJ SOLN
80.0000 mg | Freq: Two times a day (BID) | INTRAMUSCULAR | Status: DC
  Administered 2022-03-31: 80 mg via INTRAVENOUS
  Filled 2022-03-31: qty 8

## 2022-03-31 MED ORDER — MECLIZINE HCL 12.5 MG PO TABS
12.5000 mg | ORAL_TABLET | Freq: Three times a day (TID) | ORAL | Status: DC
  Administered 2022-03-31 – 2022-04-04 (×13): 12.5 mg via ORAL
  Filled 2022-03-31 (×16): qty 1

## 2022-03-31 MED ORDER — ACETAMINOPHEN 650 MG RE SUPP: 650.0000 mg | Freq: Four times a day (QID) | RECTAL | Status: DC | PRN

## 2022-03-31 MED ORDER — MELATONIN 5 MG PO TABS
5.0000 mg | ORAL_TABLET | Freq: Every day | ORAL | Status: DC
  Administered 2022-03-31 – 2022-04-03 (×4): 5 mg via ORAL
  Filled 2022-03-31 (×4): qty 1

## 2022-03-31 MED ORDER — DULOXETINE HCL 60 MG PO CPEP
60.0000 mg | ORAL_CAPSULE | Freq: Two times a day (BID) | ORAL | Status: DC
  Administered 2022-03-31 – 2022-04-04 (×9): 60 mg via ORAL
  Filled 2022-03-31 (×9): qty 1

## 2022-03-31 MED ORDER — ROPINIROLE HCL 1 MG PO TABS: 2.0000 mg | ORAL_TABLET | Freq: Every day | ORAL | Status: DC

## 2022-03-31 MED ORDER — ROPINIROLE HCL 1 MG PO TABS
1.0000 mg | ORAL_TABLET | Freq: Every day | ORAL | Status: DC
  Administered 2022-03-31 – 2022-04-03 (×4): 1 mg via ORAL
  Filled 2022-03-31 (×5): qty 1

## 2022-03-31 MED ORDER — PANTOPRAZOLE SODIUM 40 MG PO TBEC
40.0000 mg | DELAYED_RELEASE_TABLET | Freq: Every day | ORAL | Status: DC
Start: 2022-03-31 — End: 2022-04-04
  Administered 2022-03-31 – 2022-04-04 (×5): 40 mg via ORAL
  Filled 2022-03-31 (×5): qty 1

## 2022-03-31 MED ORDER — ACETAMINOPHEN 325 MG PO TABS: 650.0000 mg | ORAL_TABLET | Freq: Four times a day (QID) | ORAL | Status: DC | PRN

## 2022-03-31 MED ORDER — ASPIRIN EC 81 MG PO TBEC
81.0000 mg | DELAYED_RELEASE_TABLET | Freq: Every day | ORAL | Status: DC
  Administered 2022-03-31 – 2022-04-01 (×2): 81 mg via ORAL
  Filled 2022-03-31 (×2): qty 1

## 2022-03-31 MED ORDER — ONDANSETRON HCL 4 MG/2ML IJ SOLN: 4.0000 mg | Freq: Four times a day (QID) | INTRAMUSCULAR | Status: DC | PRN

## 2022-03-31 MED ORDER — GABAPENTIN 100 MG PO CAPS
100.0000 mg | ORAL_CAPSULE | Freq: Three times a day (TID) | ORAL | Status: DC
Start: 2022-03-31 — End: 2022-04-04
  Administered 2022-03-31 – 2022-04-04 (×13): 100 mg via ORAL
  Filled 2022-03-31 (×13): qty 1

## 2022-03-31 MED ORDER — HYDROCODONE-ACETAMINOPHEN 5-325 MG PO TABS
1.0000 | ORAL_TABLET | Freq: Once | ORAL | Status: AC
  Administered 2022-03-31: 1 via ORAL
  Filled 2022-03-31: qty 1

## 2022-03-31 MED ORDER — SPIRONOLACTONE 12.5 MG HALF TABLET
12.5000 mg | ORAL_TABLET | Freq: Every day | ORAL | Status: DC
  Administered 2022-03-31 – 2022-04-02 (×3): 12.5 mg via ORAL
  Filled 2022-03-31 (×3): qty 1

## 2022-03-31 MED ORDER — FUROSEMIDE 10 MG/ML IJ SOLN
40.0000 mg | Freq: Two times a day (BID) | INTRAMUSCULAR | Status: DC
  Administered 2022-03-31: 40 mg via INTRAVENOUS
  Filled 2022-03-31: qty 4

## 2022-03-31 MED ORDER — ONDANSETRON HCL 4 MG PO TABS
4.0000 mg | ORAL_TABLET | Freq: Four times a day (QID) | ORAL | Status: DC | PRN
  Administered 2022-04-03: 4 mg via ORAL
  Filled 2022-03-31: qty 1

## 2022-03-31 MED ORDER — POTASSIUM CHLORIDE CRYS ER 20 MEQ PO TBCR
40.0000 meq | EXTENDED_RELEASE_TABLET | Freq: Every day | ORAL | Status: DC
  Administered 2022-03-31 – 2022-04-02 (×3): 40 meq via ORAL
  Filled 2022-03-31 (×3): qty 2

## 2022-03-31 MED ORDER — HYDROCODONE-ACETAMINOPHEN 7.5-325 MG PO TABS
1.0000 | ORAL_TABLET | Freq: Four times a day (QID) | ORAL | Status: DC | PRN
  Administered 2022-04-01 (×3): 2 via ORAL
  Administered 2022-04-02 (×2): 1 via ORAL
  Administered 2022-04-02 – 2022-04-04 (×5): 2 via ORAL
  Filled 2022-03-31 (×6): qty 2
  Filled 2022-03-31: qty 1
  Filled 2022-03-31 (×2): qty 2
  Filled 2022-03-31: qty 1
  Filled 2022-03-31: qty 2

## 2022-03-31 MED ORDER — ROPINIROLE HCL 1 MG PO TABS: 1.0000 mg | ORAL_TABLET | ORAL | Status: DC

## 2022-03-31 MED ORDER — BACLOFEN 5 MG HALF TABLET
5.0000 mg | ORAL_TABLET | Freq: Three times a day (TID) | ORAL | Status: DC
  Administered 2022-03-31 – 2022-04-04 (×13): 5 mg via ORAL
  Filled 2022-03-31 (×16): qty 1

## 2022-03-31 MED ORDER — MIDODRINE HCL 5 MG PO TABS
10.0000 mg | ORAL_TABLET | Freq: Three times a day (TID) | ORAL | Status: DC
  Administered 2022-03-31: 10 mg via ORAL
  Filled 2022-03-31: qty 2

## 2022-03-31 MED ORDER — POTASSIUM CHLORIDE CRYS ER 20 MEQ PO TBCR
40.0000 meq | EXTENDED_RELEASE_TABLET | Freq: Once | ORAL | Status: AC
Start: 2022-03-31 — End: 2022-03-31
  Administered 2022-03-31: 40 meq via ORAL
  Filled 2022-03-31: qty 2

## 2022-03-31 MED ORDER — HYDROCODONE-ACETAMINOPHEN 7.5-325 MG PO TABS
1.0000 | ORAL_TABLET | Freq: Four times a day (QID) | ORAL | Status: DC | PRN
  Administered 2022-03-31 (×2): 1 via ORAL
  Filled 2022-03-31 (×2): qty 1

## 2022-03-31 MED ORDER — APIXABAN 5 MG PO TABS
10.0000 mg | ORAL_TABLET | Freq: Two times a day (BID) | ORAL | Status: DC
  Administered 2022-03-31 – 2022-04-04 (×8): 10 mg via ORAL
  Filled 2022-03-31 (×8): qty 2

## 2022-03-31 MED ORDER — SENNOSIDES-DOCUSATE SODIUM 8.6-50 MG PO TABS
1.0000 | ORAL_TABLET | Freq: Every evening | ORAL | Status: DC | PRN
  Administered 2022-04-02: 1 via ORAL
  Filled 2022-03-31 (×2): qty 1

## 2022-03-31 MED ORDER — ALBUTEROL SULFATE (2.5 MG/3ML) 0.083% IN NEBU: 2.5000 mg | INHALATION_SOLUTION | Freq: Four times a day (QID) | RESPIRATORY_TRACT | Status: DC | PRN

## 2022-03-31 NOTE — Progress Notes (Signed)
?PROGRESS NOTE ? ? ? ?[name]  ZDG:[id number] DOB: Aug 11, 1946 DOA: 03/30/2022 ?PCP: Janice Morning, DO  ? ? ?Brief Narrative:  ?[name] is a 76 y.o. female with past medical history of diastolic heart failure, pulmonary hypertension, history of cryptogenic stroke, CKD stage IIIb, orthostatic hypotension, hyperlipidemia, anemia of chronic disease, history of gastric bypass, restless leg syndrome, depression and obstructive sleep apnea on CPAP presented to hospital to hospital with shortness of breath.  Patient was noted to have new bilateral pulmonary embolism and was started on IV Lasix and heparin.  Of note, patient was recently hospitalized 02/03/2022 for heart failure exacerbation.  Cardiac amyloidosis work-up was equivocal.  Plan was to do PYP scan in 4 to 5 months.  Patient had returned home from skilled nursing facility on 03/29/2022 when he started having increasing swelling to the lower extremities with orthopnea, PND and dyspnea on exertion..  ED vitals were stable.  BMP showed hypokalemia with a creatinine elevated at 1.5.  Chest x-ray showed bilateral pleural effusion, pulmonary edema and prominent right hilar region.  CTA of the chest showed segmental and subsegmental filling defects in the right upper lobe and left lower lobe.  In the ED, patient was given Lasix and oral potassium and was admitted hospital for further evaluation and treatment. ? ?Assessment and Plan: ? ?* Acute on chronic heart failure with preserved ejection fraction (HFpEF)/ ?Moderately severe PAH ?Patient with signs and symptoms of heart failure at this time.  Borderline elevated troponins.  2D echocardiogram from 02/04/2022 with ejection fraction of 55 to 60%.  There was some concern for cardiac amyloidosis in the previous admission.  Does have orthostatic hypotension neuropathy and acute on chronic heart failure which could suggest towards cardiac amyloidosis.  2D echocardiogram was not impressive and cardiac MRI was equivocal  for amyloidosis.  Plan is to repeat PYP scan in 4 to 5 months for progression.  Continue Lasix 80 mg twice daily strict intake charting, and daily weights.  GDMT has been limited due to orthostatic hypotension.  Cardiology has added spironolactone and decrease dose of midodrine.  Might benefit from sleep study as outpatient. ?  ?Bilateral pulmonary embolism ([place]) ?CTA chest showing segmental and subsegmental pulmonary embolism in the right upper and lower lobe without right heart strain.  On IV heparin and supplemental oxygen at this time.  Spoke with the patient regarding oral anticoagulation options at this time.  2D echocardiogram done today showed LV ejection fraction of 55 to 60% with mildly elevated pulmonary artery systolic pressure at 42 mm hg and LVH. ?  ?Chronic kidney disease, stage 3b ([place]) ?Creatinine of 1.5.  We will continue diuresis.  Check BMP in AM.   ? ?Orthostatic hypotension ?Continue midodrine 5 mg 3 times daily. ?  ?History of cerebrovascular accident ?History of cryptogenic stroke without evidence for atrial fibrillation. Continue aspirin for now.  On heparin drip.  Not on statins due to myalgia. ? ?Hypokalemia ?Continue oral supplement.  Check magnesium and replete if needed. Improved today. ?  ?OSA on CPAP ?Continue CPAP nightly. ?  ?Chronic pain syndrome ?Continue Norco, change to as needed with hold parameters.  Patient complains of multiple sites pain including her back hands and legs.  Patient states that she does have gout and was on allopurinol which was stopped February.  Was briefly on colchicine few days prior to coming to hospital.  We will check a uric acid levels. ?  ?RLS (restless legs syndrome) ?Continue ropinirole. ? ?Debility, deconditioning,generalized  weakness ?Patient was recently at the rehabilitation facility and discharged home couple of days back.  Appears very fatigued weak and deconditioned.  We will get PT OT evaluation again this admission ?  ? ? DVT prophylaxis:  Place TED hose Start: 03/31/22 1209 ? ? ?Code Status:   ?  Code Status: DNR ? ?Disposition: Unknown at this time.  Patient requesting CIR.  Patient has been using wheelchair at home for ambulation and has been having difficulty transferring.  She was recently at [facility] skilled nursing facility. ? ?Status is: Inpatient ?Remains inpatient appropriate because: Need for further diuresis, cardiology evaluation, possible rehabilitation, ? ? Family Communication:  ?Spoke with the patient's husband on the phone and updated him about the clinical condition of the patient ? ?Consultants:  ?Cardiology ? ?Procedures:  ?None ? ?Antimicrobials:  ?None ? ?Anti-infectives (From admission, onward)  ? ? None  ? ?  ? ?Subjective: ?Today, patient was seen and examined at bedside.  Patient complains of back pain and pain and leg pain and states that she does have gout.  She used to be on allopurinol which was discontinued February 2023.  She also stated some chest pain which improved with nitro. ? ?Objective: ?Vitals:  ? 03/31/22 1600 03/31/22 1615 03/31/22 1630 03/31/22 1700  ?BP: 109/65 (!) 115/54 119/64 (!) 101/58  ?Pulse: (!) 101 (!) 101 (!) 102 (!) 104  ?Resp: '18 17 20 '$ (!) 21  ?Temp:      ?TempSrc:      ?SpO2: 95% 95% 96% 98%  ?Weight:      ?Height:      ? ? ?Intake/Output Summary (Last 24 hours) at 03/31/2022 1741 ?Last data filed at 03/31/2022 1522 ?Gross per 24 hour  ?Intake --  ?Output 1900 ml  ?Net -1900 ml  ? ?Filed Weights  ? 03/30/22 2330  ?Weight: 85.3 kg  ? ? ?Physical Examination: ?Body mass index is 29.45 kg/m?.  ? ?General:  Average built, not in obvious distress, elderly female, deconditioned. ?HENT:   No scleral pallor or icterus noted. Oral mucosa is moist.  Distended neck veins. ?Chest:  Clear breath sounds.  Diminished breath sounds bilaterally. No crackles or wheezes.  ?CVS: S1 &S2 heard, occasional basal rales. ?Abdomen: Soft, nontender, nondistended.  Bowel sounds are heard.   ?Extremities: No cyanosis,  clubbing with peripheral pitting edema noted.  Peripheral pulses are palpable.'s of the joints. ?Psych: Alert, awake and oriented, normal mood ?CNS:  No cranial nerve deficits.  Power equal in all extremities.   ?Skin: Warm and dry.  No rashes noted. ? ?Data Reviewed:  ? ?CBC: ?Recent Labs  ?Lab 03/30/22 ?1734  ?WBC 6.0  ?HGB 10.1*  ?HCT 32.2*  ?MCV 101.6*  ?PLT 282  ? ? ?Basic Metabolic Panel: ?Recent Labs  ?Lab 03/30/22 ?1734 03/31/22 ?0825  ?NA 139 138  ?K 3.2* 3.5  ?CL 103 105  ?CO2 27 23  ?GLUCOSE 118* 151*  ?BUN 41* 35*  ?CREATININE 1.59* 1.68*  ?CALCIUM 9.2 8.7*  ?MG  --  2.0  ? ? ?Liver Function Tests: ?Recent Labs  ?Lab 03/30/22 ?2134  ?AST 17  ?ALT 15  ?ALKPHOS 89  ?BILITOT 0.8  ?PROT 6.0*  ?ALBUMIN 2.8*  ? ? ? ?Radiology Studies: ?DG Chest 2 View ? ?Result Date: 03/30/2022 ?CLINICAL DATA:  chest pain EXAM: CHEST - 2 VIEW COMPARISON:  CT angiography chest 02/04/2022, chest x-ray 02/04/2022 FINDINGS: Left chest wall wireless cardiac pacemaker. The heart and mediastinal contours are unchanged. Prominent right hilar region. No  focal consolidation. Chronic coarsened markings with no overt pulmonary edema. Bilateral pleural effusions. No pneumothorax. No acute osseous abnormality. Reversed total right shoulder arthroplasty. IMPRESSION: 1. Bilateral pleural effusions. 2. Possible mild pulmonary edema. 3. Prominent right hilar region. Underlying mass lesion or lymphadenopathy not excluded. Consider CT chest with intravenous contrast for further evaluation. Electronically Signed   By: Iven Finn M.D.   On: 03/30/2022 19:10  ? ?CT Angio Chest Pulmonary Embolism (PE) W or WO Contrast ? ?Result Date: 03/30/2022 ?CLINICAL DATA:  Pulmonary embolism suspected, high probability. EXAM: CT ANGIOGRAPHY CHEST WITH CONTRAST TECHNIQUE: Multidetector CT imaging of the chest was performed using the standard protocol during bolus administration of intravenous contrast. Multiplanar CT image reconstructions and MIPs were  obtained to evaluate the vascular anatomy. RADIATION DOSE REDUCTION: This exam was performed according to the departmental dose-optimization program which includes automated exposure control, adjustment of the mA

## 2022-03-31 NOTE — Assessment & Plan Note (Signed)
CTA chest shows segmental and subsegmental pulmonary artery filling defects in the right upper lobe and left lower lobe without evidence of right heart strain.  Patient is currently hemodynamically stable and saturating well on room air. ?-Start on IV heparin ?-Supplemental oxygen as needed ?

## 2022-03-31 NOTE — Assessment & Plan Note (Signed)
Continue oral supplement.  Check magnesium and replete if needed. ?

## 2022-03-31 NOTE — Assessment & Plan Note (Signed)
Continue ropinirole 

## 2022-03-31 NOTE — Assessment & Plan Note (Signed)
History of cryptogenic stroke s/p LINQ with no evidence of A-fib. ?-Continue aspirin for now, may be able to discontinue now that she has required anticoagulation for VTE treatment ?-Not currently on statin or Setia due to reported myalgias adverse effect ?

## 2022-03-31 NOTE — Assessment & Plan Note (Signed)
Renal function stable at recent baseline.  Continue to monitor with diuresis. ?

## 2022-03-31 NOTE — TOC Benefit Eligibility Note (Signed)
Patient Advocate Encounter  Insurance verification completed.    The patient is currently admitted and upon discharge could be taking Eliquis 5 mg.  The current 30 day co-pay is, $47.00.   The patient is currently admitted and upon discharge could be taking Xarelto 20 mg.  The current 30 day co-pay is, $47.00.   The patient is insured through [employer] Medicare Part D     Kyelle Urbas, CPhT Pharmacy Patient Advocate Specialist [facility] Patient Advocate Team Direct Number: [phone]  Fax: [phone]        

## 2022-03-31 NOTE — Assessment & Plan Note (Signed)
Continue CPAP nightly. °

## 2022-03-31 NOTE — Progress Notes (Signed)
ANTICOAGULATION CONSULT NOTE ? ?Pharmacy Consult for apixaban ?Indication: pulmonary embolus ? ?Allergies  ?Allergen Reactions  ? Atorvastatin   ?  Other reaction(s): myalgia  ? Crestor [Rosuvastatin]   ?  Other reaction(s): myalgia  ? Ezetimibe   ?  Other reaction(s): myalgia  ? Amitriptyline Rash  ? ? ?Patient Measurements: ?Height: '5\' 7"'$  (170.2 cm) ?Weight: 85.3 kg (188 lb 0.8 oz) ?IBW/kg (Calculated) : 61.6 ?Heparin Dosing Weight: 80kg ? ?Vital Signs: ?BP: 101/58 (04/17 1700) ?Pulse Rate: 104 (04/17 1700) ? ?Labs: ?Recent Labs  ?  03/30/22 ?2330 03/30/22 ?2134 03/31/22 ?0825  ?HGB 10.1*  --   --   ?HCT 32.2*  --   --   ?PLT 282  --   --   ?HEPARINUNFRC  --   --  0.28*  ?CREATININE 1.59*  --  1.68*  ?TROPONINIHS 170* 159*  --   ? ? ? ?Estimated Creatinine Clearance: 33 mL/min (A) (by C-G formula based on SCr of 1.68 mg/dL (H)). ? ? ?Medical History: ?Past Medical History:  ?Diagnosis Date  ? Anemia   ? unable to absorb iron after gastric bypass  ? Arthritis   ? generalized  ? Asthma   ? Atrophic vaginitis   ? Back pain   ? DDD/stenosis  ? Carotid stenosis   ? Carotid US 10/16: Plaque RICA (0-76%), normal LICA  ? Depression   ? takes Cymbalta daily  ? Diverticulosis   ? benign  ? DJD (degenerative joint disease)   ? Dyslipidemia   ? Dysrhythmia   ? Family history of GI bleeding   ? GERD (gastroesophageal reflux disease)   ? takes Nexium daily  ? Gestational diabetes   ? Pt denies  ? H/O hiatal hernia   ? surgery for hernia  ? Headache(784.0)   ? takes Imitrex daily as needed and Bisoprolol daily;last migraine was about 2wks ago  ? History of bronchitis 1 yr ago  ? History of shingles   ? Insomnia   ? takes Ambien nightly  ? Joint pain   ? Joint swelling   ? Leg cramps   ? takes Flexeril daily as needed  ? 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 Nocturia   ? Osteoporosis   ? Peripheral neuropathy   ? takes Gabapentin daily  ? Pneumonia 71yr ago  ? hx of  ? PONV (postoperative nausea and vomiting)   ? Restless leg  syndrome   ? takes Requip daily  ? RLS (restless legs syndrome) 08/16/2015  ? Sleep apnea   ? uses CPAP  ? Stroke ([facility]   ? Tubular adenoma of colon   ? Vertigo   ? Vitamin D deficiency   ? ? ?Assessment: ?89yofemale c/o CP w/ SOB, CT reveals pulmonary artery filling defects in RUL/LLL without RHS. Pharmacy consulted for new-start apixaban. ? ?Hgb 10.1; plt 282 ? ?Goal of Therapy:  ?Monitor platelets by anticoagulation protocol: Yes ?  ?Plan:  ?D/c heparin infusion ?Start apixaban '10mg'$  BID x 7 days then '5mg'$  BID ?Monitor CBC and for s/s of hemorrhage ?Patient will need education on eliquis prior to discharge ? ?JLorelei Pont PharmD, BCPS ?03/31/2022 8:00 PM ?ED Clinical Pharmacist -  [phone]? ? ?

## 2022-03-31 NOTE — Assessment & Plan Note (Addendum)
Moderately severe PAH ?Presenting with progressive lower extremity edema, orthopnea, PND, DOE, BNP 1400, pulmonary edema on CXR.  Last EF 55-60% with G2DD by TTE on 02/04/2022.  Equivocal cardiac amyloidosis work-up on prior admission, cardiology plan for repeat PYP in several months.  Troponin mildly elevated and downtrending on repeat, likely demand ischemia from acute on chronic HFpEF exacerbation. ?-Start on IV Lasix 40 mg twice daily ?-Monitor strict I/O's and daily weights ?-GDMT has been limited due to orthostatic hypotension ?

## 2022-03-31 NOTE — Hospital Course (Addendum)
[name] is a 76 y.o. female with past medical history of diastolic heart failure, pulmonary hypertension, history of cryptogenic stroke, CKD stage IIIb, orthostatic hypotension, hyperlipidemia, anemia of chronic disease, history of gastric bypass, restless leg syndrome, depression and obstructive sleep apnea on CPAP presented to hospital to hospital with shortness of breath.  Patient was noted to have new bilateral pulmonary embolism and was started on IV Lasix and IV heparin.  Of note, patient was recently hospitalized 02/03/2022 for heart failure exacerbation.  Cardiac amyloidosis work-up was equivocal.  Plan was to do PYP scan in 4 to 5 months.  Patient had returned home from skilled nursing facility on 03/29/2022 when he started having increasing swelling to the lower extremities with orthopnea, PND and dyspnea on exertion..  In the ED, vitals were stable.  BMP showed hypokalemia with a creatinine elevated at 1.5.  Chest x-ray showed bilateral pleural effusion, pulmonary edema and prominent right hilar region.  CTA of the chest showed segmental and subsegmental filling defects in the right upper lobe and left lower lobe.  In the ED, patient was given Lasix and oral potassium and was admitted hospital for further evaluation and treatment. ? ?Assessment and Plan: ? ?Principal Problem: ?  Acute on chronic heart failure with preserved ejection fraction (HFpEF) ([place]) ?Active Problems: ?  Bilateral pulmonary embolism ([place]) ?  Chronic kidney disease, stage 3b ([place]) ?  Orthostatic hypotension ?  History of cerebrovascular accident ?  Hypokalemia ?  OSA on CPAP ?  RLS (restless legs syndrome) ?  Dyspnea ?  Chronic pain syndrome ?  Fatigue ? ?Acute on chronic heart failure with preserved ejection fraction (HFpEF)/ ?Moderately severe PAH ?2D echocardiogram from 02/04/2022 with ejection fraction of 55 to 60%.    2D echocardiogram was not impressive and cardiac MRI was equivocal for amyloidosis.  Plan is to repeat PYP scan  in 4 to 5 months for progression.  Patient has received Lasix 80 mg twice daily strict intake charting, and daily weights with improvement in her symptoms today.  Cardiology followed the patient during hospitalization.  Spironolactone was initiated which was discontinued at this time.  Unable to use ARB and ACE inhibitors/beta-blockers at this time.  Has orthostatic hypotension and renal insufficiency.  Midodrine dose has been increased to 10 mg 3 times daily for orthostatic hypotension, will likely benefit from sleep study as outpatient.  Low-dose torsemide has been resumed from today.  Okay from cardiology point of view for CIR placement. ?  ?Bilateral pulmonary embolism ([place]) ?CTA chest showing segmental and subsegmental pulmonary embolism in the right upper and lower lobe without right heart strain.  Continue Eliquis for anticoagulation.. 2D echocardiogram done on 03/31/2022 showed LV ejection fraction of 55 to 60% with mildly elevated pulmonary artery systolic pressure at 42 mmhg and LVH.  Continues to have some chest discomfort and pain secondary to PE.  Continue symptomatic care. ?  ?Chronic kidney disease, stage 3b ([place]) ?Creatinine of 1.9 today from 2.2 < 1.7.  We will need to monitor creatinine levels closely on diuretic.  Torsemide has been initiated again today.  ? ?Orthostatic hypotension ?Continue midodrine 10 mg 3 times daily.  Dose has been increased due to orthostatic hypotension.  Continue abdominal binder and elastic stockings.. ?  ?History of cerebrovascular accident ?History of cryptogenic stroke without evidence for atrial fibrillation. Continue Eliquis for anticoagulation due to pulmonary embolism..   Not on statins due to myalgia. ? ?Hypokalemia ?Resolved with replacement.  Potassium prior to discharge was  4.6. ?  ?OSA on CPAP ?Continue CPAP nightly. ?  ?Chronic pain syndrome ?Continue Norco.  Patient states that she does have gout and was on allopurinol which was stopped February to renal  function  Was briefly on colchicine few days prior to coming to hospital.  Uric acid levels were elevated at 11.1 on this presentation..  Patient has been started on prednisone burst for 3 days.  Will complete on 04/05/2022.  Improving pain and swelling over the joints. ?  ?RLS (restless legs syndrome) ?Continue ropinirole. ? ?Monoclonal lambda light chain.   ?Cardiology recommends hematology follow-up after discharge.  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 ? ?Debility, deconditioning,generalized weakness ?Patient was recently at the rehabilitation facility and discharged home . Appears  fatigued weak and deconditioned.  Physical therapy and occupational therapy has seen the patient and patient has been approved for CIR placement again.  ?

## 2022-03-31 NOTE — Assessment & Plan Note (Signed)
Continue Norco, change to as needed with hold parameters. ?

## 2022-03-31 NOTE — ED Notes (Signed)
Pt requesting dose of ropinirole. RN explained to pt that she has an order for it later today but will page MD to see if pt can have a dose tonight. MD paged.  ?

## 2022-03-31 NOTE — Assessment & Plan Note (Signed)
Continue midodrine 10 mg 3 times daily. ?

## 2022-03-31 NOTE — Progress Notes (Signed)
ANTICOAGULATION CONSULT NOTE ? ?Pharmacy Consult for heparin ?Indication: pulmonary embolus ? ?Allergies  ?Allergen Reactions  ? Atorvastatin   ?  Other reaction(s): myalgia  ? Crestor [Rosuvastatin]   ?  Other reaction(s): myalgia  ? Ezetimibe   ?  Other reaction(s): myalgia  ? Amitriptyline Rash  ? ? ?Patient Measurements: ?Height: '5\' 7"'$  (170.2 cm) ?Weight: 85.3 kg (188 lb 0.8 oz) ?IBW/kg (Calculated) : 61.6 ?Heparin Dosing Weight: 80kg ? ?Vital Signs: ?BP: 113/56 (04/17 0630) ?Pulse Rate: 113 (04/17 0630) ? ?Labs: ?Recent Labs  ?  03/30/22 ?8841 03/30/22 ?2134 03/31/22 ?0825  ?HGB 10.1*  --   --   ?HCT 32.2*  --   --   ?PLT 282  --   --   ?HEPARINUNFRC  --   --  0.28*  ?CREATININE 1.59*  --  1.68*  ?TROPONINIHS 170* 159*  --   ? ? ? ?Estimated Creatinine Clearance: 33 mL/min (A) (by C-G formula based on SCr of 1.68 mg/dL (H)). ? ? ?Medical History: ?Past Medical History:  ?Diagnosis Date  ? Anemia   ? unable to absorb iron after gastric bypass  ? Arthritis   ? generalized  ? Asthma   ? Atrophic vaginitis   ? Back pain   ? DDD/stenosis  ? Carotid stenosis   ? Carotid US 10/16: Plaque RICA (6-60%), normal LICA  ? Depression   ? takes Cymbalta daily  ? Diverticulosis   ? benign  ? DJD (degenerative joint disease)   ? Dyslipidemia   ? Dysrhythmia   ? Family history of GI bleeding   ? GERD (gastroesophageal reflux disease)   ? takes Nexium daily  ? Gestational diabetes   ? Pt denies  ? H/O hiatal hernia   ? surgery for hernia  ? Headache(784.0)   ? takes Imitrex daily as needed and Bisoprolol daily;last migraine was about 2wks ago  ? History of bronchitis 1 yr ago  ? History of shingles   ? Insomnia   ? takes Ambien nightly  ? Joint pain   ? Joint swelling   ? Leg cramps   ? takes Flexeril daily as needed  ? 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 Nocturia   ? Osteoporosis   ? Peripheral neuropathy   ? takes Gabapentin daily  ? Pneumonia 58yr ago  ? hx of  ? PONV (postoperative nausea and vomiting)   ? Restless leg  syndrome   ? takes Requip daily  ? RLS (restless legs syndrome) 08/16/2015  ? Sleep apnea   ? uses CPAP  ? St[facility] ([facility]   ? Tubular adenoma of colon   ? Vertigo   ? Vitamin D deficiency   ? ? ?Assessment: ?75yofemale c/o CP w/ SOB, CT reveals pulmonary artery filling defects in RUL/LLL without RHS >> to start heparin. ? ?Initial heparin level just below goal at 0.28 on 1300 units/hr. No bleeding or IV issues noted. Hgb 10.1, platelet within normal limits.  ? ?Goal of Therapy:  ?Heparin level 0.3-0.7 units/ml ?Monitor platelets by anticoagulation protocol: Yes ?  ?Plan:  ?Increase heparin to 1500 units/hr ?Recheck heparin level in 8 hours ? ?FErin HearingPharmD., BCPS ?Clinical Pharmacist ?03/31/2022 10:08 AM ? ?

## 2022-03-31 NOTE — Consult Note (Addendum)
?  ?Advanced Heart Failure Team Consult Note ? ? ?Primary Physician: [name], DO ?PCP-Cardiologist:  Sinclair Grooms, MD ? ?Reason for Consultation: acute on chronic diastolic CHF ? ?HPI:   ? ?[name] is seen today for evaluation of acute on chronic diastolic CHF at the request of Dr. Posey Pronto with [facility].  ? ?76 y.o.female with history of cryptogenic stroke s/p LINQ with no evidence of AF, hx anomalous origin of the RCA, OSA, pulmonary hypertension, hx gastric bypass surgery, peripheral neuropathy. ?  ?Admitted 2/23 from EP office with complaints of dyspnea. CTA chest no PE. Echo showed EF 55-60%, RV okay, RVSP 41.9 mmHg, mild to moderate MR, moderate AI, mild AS with mean gradient 15 mmHg, dilated IVC with estimated RA pressure 8 mmHg. Had negative stress test. CTA chest no evidence of PE, 4th and 6th left rib fractures, b/l pleural effusions. RHC showed RA mean 16, PCWP mean 23 mmHg, PAP mean 38 mmHg, Fick CO/CI 7.5/3.71, PAPi 1.75, PVR 1.99. She was diuresed with IV lasix. dCHF, orthostasis and peripheral neuropathy suspicious for amyloid, however cMRI was equivocal for amyloidosis and PYP was not suggestive. GDMT limited by orthostasis and elevated SCr. Discharged to CIR, weight 180 lbs. ? ?Seen for follow-up 04/04. Weight 187 lb. Volume appeared elevated. Torsemide increased to 20 mg daily. Midodrine decreased.  ? ?Presented to ED yesterday evening with worsening lower extremity edema and chest pain. Reports worsening LE edema and at least 5 lb weight gain X 1 week. She cannot recall her home weight. Labs significant for Scr 1.59, K 3.2, Hgb 10.1, HS troponin 170>159, BNP 1,405.  CXR with evidence of pulmonary edema and b/l pleural effusions. She was given 40 mg lasix IV. CTA chest demonstrated segmental and subsegmental PE RUL and LLB, no evidence of right heart strain, pulmonary edema. She was started on heparin gtt. Patient admitted to [facility] for management of b/l PE and acute on chronic diastolic CHF.   Now diuresing with IV lasix 40 mg BID.  ? ?She was discharged from [facility] to home on 04/15. Reports she has been increasingly weak and is worried she may need more inpatient rehab. Having pain in fingers and toes and believes she has an acute gout flare.  ? ? ?Review of Systems: [y] = yes, '[ ]'$  = no  ? ?General: Weight gain [Y]; Weight loss '[ ]'$ ; Anorexia '[ ]'$ ; Fatigue [Y]; Fever '[ ]'$ ; Chills '[ ]'$ ; Weakness [Y]  ?Cardiac: Chest pain/pressure [Y]; Resting SOB [Y]; Exertional SOB [Y]; Orthopnea [Y]; Pedal Edema [Y]; Palpitations '[ ]'$ ; Syncope '[ ]'$ ; Presyncope '[ ]'$ ; Paroxysmal nocturnal dyspnea'[ ]'$   ?Pulmonary: Cough '[ ]'$ ; Wheezing'[ ]'$ ; Hemoptysis'[ ]'$ ; Sputum '[ ]'$ ; Snoring '[ ]'$   ?GI: Vomiting'[ ]'$ ; Dysphagia'[ ]'$ ; Melena'[ ]'$ ; Hematochezia '[ ]'$ ; Heartburn'[ ]'$ ; Abdominal pain '[ ]'$ ; Constipation '[ ]'$ ; Diarrhea '[ ]'$ ; BRBPR '[ ]'$   ?GU: Hematuria'[ ]'$ ; Dysuria '[ ]'$ ; Nocturia'[ ]'$   ?Vascular: Pain in legs with walking '[ ]'$ ; Pain in feet with lying flat '[ ]'$ ; Non-healing sores '[ ]'$ ; Stroke '[ ]'$ ; TIA '[ ]'$ ; Slurred speech '[ ]'$ ;  ?Neuro: Headaches'[ ]'$ ; Vertigo'[ ]'$ ; Seizures'[ ]'$ ; Paresthesias'[ ]'$ ;Blurred vision '[ ]'$ ; Diplopia '[ ]'$ ; Vision changes '[ ]'$   ?Ortho/Skin: Arthritis '[ ]'$ ; Joint pain '[ ]'$ ; Muscle pain '[ ]'$ ; Joint swelling '[ ]'$ ; Back Pain '[ ]'$ ; Rash '[ ]'$   ?Psych: Depression'[ ]'$ ; Anxiety'[ ]'$   ?Heme: Bleeding problems '[ ]'$ ; Clotting disorders '[ ]'$ ; Anemia '[ ]'$   ?Endocrine: Diabetes '[ ]'$ ;  Thyroid dysfunction'[ ]'$  ? ?Home Medications ?Prior to Admission medications   ?Medication Sig Start Date End Date Taking? Authorizing Provider  ?acetaminophen (TYLENOL) 325 MG tablet Take 2 tablets (650 mg total) by mouth every 4 (four) hours as needed for headache or mild pain. 02/27/22  Yes Love, Ivan Anchors, PA-C  ?ASPIRIN 81 PO Take 81 mg by mouth at bedtime.   Yes [provider]  ?Baclofen 5 MG TABS Take 1 tablet by mouth 3 (three) times daily. 03/29/22  Yes [provider]  ?benzocaine (ORAJEL) 10 % mucosal gel Use as directed in the mouth or throat 4 (four) times daily as  needed for mouth pain. 02/27/22  Yes Love, Ivan Anchors, PA-C  ?calcium carbonate (OS-CAL) 1250 (500 CA) MG chewable tablet Chew 1 tablet by mouth daily. Not sure of dosage   Yes [provider]  ?DULoxetine (CYMBALTA) 60 MG capsule Take 60 mg by mouth 2 (two) times daily. 08/06/16  Yes [provider]  ?esomeprazole (NEXIUM) 40 MG capsule Take 40 mg by mouth 2 (two) times daily before a meal.  12/20/14  Yes [provider]  ?flavoxATE (URISPAS) 100 MG tablet Take 1 tablet (100 mg total) by mouth 3 (three) times daily as needed for bladder spasms (please offer to pt- cannot use pyridium due to kidney fxn). 03/03/22  Yes Love, Ivan Anchors, PA-C  ?gabapentin (NEURONTIN) 100 MG capsule Take 1 capsule (100 mg total) by mouth 3 (three) times daily. 03/04/22  Yes Love, Ivan Anchors, PA-C  ?HYDROcodone-acetaminophen (NORCO) 7.5-325 MG tablet Take 1 tablet by mouth 4 (four) times daily. 03/03/22  Yes Love, Ivan Anchors, PA-C  ?meclizine (ANTIVERT) 12.5 MG tablet Take 1 tablet (12.5 mg total) by mouth 3 (three) times daily. 03/03/22  Yes Love, Ivan Anchors, PA-C  ?melatonin 5 MG TABS Take 1 tablet (5 mg total) by mouth at bedtime. 02/27/22  Yes Love, Ivan Anchors, PA-C  ?midodrine (PROAMATINE) 10 MG tablet Take 1 tablet (10 mg total) by mouth 3 (three) times daily. 03/18/22  Yes Milford, Maricela Bo, FNP  ?ondansetron (ZOFRAN-ODT) 4 MG disintegrating tablet DISSOLVE 1 TABLET(4 MG) ON THE TONGUE EVERY 8 HOURS AS NEEDED FOR NAUSEA OR VOMITING ?Patient taking differently: Take 4 mg by mouth every 8 (eight) hours as needed for nausea. 11/20/20  Yes Melvenia Beam, MD  ?PROAIR HFA 108 [phone] Base) MCG/ACT inhaler Inhale 1 puff into the lungs every 6 (six) hours as needed for shortness of breath. 11/26/15  Yes [provider]  ?rOPINIRole (REQUIP) 1 MG tablet Take 2 tablets (2 mg total) by mouth at bedtime. ?Patient taking differently: Take 1-2 mg by mouth See admin instructions. 1 tab at 4 pm ?2 tabs at bedtime 03/18/22  Yes Milford,  Maricela Bo, FNP  ?senna (SENOKOT) 8.6 MG TABS tablet Take 1 tablet (8.6 mg total) by mouth daily. 02/16/22  Yes Domenic Polite, MD  ?torsemide (DEMADEX) 20 MG tablet Take 1 tablet (20 mg total) by mouth daily. Starting Thursday 03/18/22  Yes Milford, Maricela Bo, FNP  ?triamcinolone ointment (KENALOG) 0.1 % Apply 1 application. topically 2 (two) times daily as needed (rash, irritation). 01/30/22  Yes [provider]  ?Vitamin D, Ergocalciferol, (DRISDOL) 1.25 MG (50000 UNIT) CAPS capsule Take 1 capsule (50,000 Units total) by mouth every 7 (seven) days. 06/10/21  Yes Dohmeier, Asencion Partridge, MD  ? ? ?Past Medical History: ?Past Medical History:  ?Diagnosis Date  ? Anemia   ? unable to absorb iron after gastric bypass  ?  Arthritis   ? generalized  ? Asthma   ? Atrophic vaginitis   ? Back pain   ? DDD/stenosis  ? Carotid stenosis   ? Carotid US 10/16: Plaque RICA (1-88%), normal LICA  ? Depression   ? takes Cymbalta daily  ? Diverticulosis   ? benign  ? DJD (degenerative joint disease)   ? Dyslipidemia   ? Dysrhythmia   ? Family history of GI bleeding   ? GERD (gastroesophageal reflux disease)   ? takes Nexium daily  ? Gestational diabetes   ? Pt denies  ? H/O hiatal hernia   ? surgery for hernia  ? Headache(784.0)   ? takes Imitrex daily as needed and Bisoprolol daily;last migraine was about 2wks ago  ? History of bronchitis 1 yr ago  ? History of shingles   ? Insomnia   ? takes Ambien nightly  ? Joint pain   ? Joint swelling   ? Leg cramps   ? takes Flexeril daily as needed  ? 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 Nocturia   ? Osteoporosis   ? Peripheral neuropathy   ? takes Gabapentin daily  ? Pneumonia 25yr ago  ? hx of  ? PONV (postoperative nausea and vomiting)   ? Restless leg syndrome   ? takes Requip daily  ? RLS (restless legs syndrome) 08/16/2015  ? Sleep apnea   ? uses CPAP  ? Stroke ([facility]   ? Tubular adenoma of colon   ? Vertigo   ? Vitamin D deficiency   ? ? ?Past Surgical History: ?Past Surgical History:   ?Procedure Laterality Date  ? COLONOSCOPY    ? EP IMPLANTABLE DEVICE N/A 11/19/2016  ? Procedure: Loop Recorder Insertion;  Surgeon: SDeboraha Sprang MD;  [facility];  Service: Cardiovascu

## 2022-03-31 NOTE — Progress Notes (Signed)
Heart Failure Navigator Progress Note ? ?Assessed for Heart & Vascular TOC clinic readiness.  ?Patient does not meet criteria due to Advance Heart Failure Team consulted.  ? ? ? ?Earnestine Leys, BSN, RN ?Heart Failure Nurse Navigator ?Secure Chat Only   ?

## 2022-03-31 NOTE — Progress Notes (Signed)
PT Cancellation Note ? ?Patient Details ?Name: [name] ?MRN: [id number] ?DOB: 12/19/1945 ? ? ?Cancelled Treatment:    Reason Eval/Treat Not Completed: Medical issues which prohibited therapy Pt with PE and per protocol must be on heparin 24 hours prior to initiation of therapy. Will follow up as pt medically appropriate and as schedule allows.  ? ?Reuel Derby, PT, DPT  ?Acute Rehabilitation Services  ?Pager: [phone] ?Office: [phone] ? ? ? ?[place] ?03/31/2022, 12:16 PM ?

## 2022-03-31 NOTE — ED Notes (Signed)
Pt wound dressing changed, pt also given sandwich and coke to drink.  ?

## 2022-04-01 ENCOUNTER — Inpatient Hospital Stay (HOSPITAL_COMMUNITY): Payer: Medicare Other

## 2022-04-01 ENCOUNTER — Encounter (HOSPITAL_COMMUNITY): Payer: Self-pay | Admitting: Internal Medicine

## 2022-04-01 ENCOUNTER — Other Ambulatory Visit: Payer: Self-pay

## 2022-04-01 DIAGNOSIS — N1832 Chronic kidney disease, stage 3b: Secondary | ICD-10-CM | POA: Diagnosis not present

## 2022-04-01 DIAGNOSIS — I2699 Other pulmonary embolism without acute cor pulmonale: Secondary | ICD-10-CM

## 2022-04-01 DIAGNOSIS — I5033 Acute on chronic diastolic (congestive) heart failure: Secondary | ICD-10-CM | POA: Diagnosis not present

## 2022-04-01 DIAGNOSIS — G894 Chronic pain syndrome: Secondary | ICD-10-CM | POA: Diagnosis not present

## 2022-04-01 LAB — BASIC METABOLIC PANEL
Anion gap: 13 (ref 5–15)
BUN: 37 mg/dL — ABNORMAL HIGH (ref 8–23)
CO2: 25 mmol/L (ref 22–32)
Calcium: 8.9 mg/dL (ref 8.9–10.3)
Chloride: 99 mmol/L (ref 98–111)
Creatinine, Ser: 1.66 mg/dL — ABNORMAL HIGH (ref 0.44–1.00)
GFR, Estimated: 32 mL/min — ABNORMAL LOW (ref >60)
Glucose, Bld: 87 mg/dL (ref 70–99)
Potassium: 4 mmol/L (ref 3.5–5.1)
Sodium: 137 mmol/L (ref 135–145)

## 2022-04-01 LAB — CBC
HCT: 31.5 % — ABNORMAL LOW (ref 36.0–46.0)
Hemoglobin: 9.9 g/dL — ABNORMAL LOW (ref 12.0–15.0)
MCH: 31.5 pg (ref 26.0–34.0)
MCHC: 31.4 g/dL (ref 30.0–36.0)
MCV: 100.3 fL — ABNORMAL HIGH (ref 80.0–100.0)
Platelets: 246 10*3/uL (ref 150–400)
RBC: 3.14 MIL/uL — ABNORMAL LOW (ref 3.87–5.11)
RDW: 14.2 % (ref 11.5–15.5)
WBC: 5.8 10*3/uL (ref 4.0–10.5)
nRBC: 0 % (ref 0.0–0.2)

## 2022-04-01 LAB — URIC ACID: Uric Acid, Serum: 11.1 mg/dL — ABNORMAL HIGH (ref 2.5–7.1)

## 2022-04-01 LAB — MAGNESIUM: Magnesium: 2.2 mg/dL (ref 1.7–2.4)

## 2022-04-01 LAB — MRSA NEXT GEN BY PCR, NASAL: MRSA by PCR Next Gen: NOT DETECTED

## 2022-04-01 MED ORDER — FUROSEMIDE 10 MG/ML IJ SOLN
80.0000 mg | Freq: Once | INTRAMUSCULAR | Status: AC
  Administered 2022-04-01: 80 mg via INTRAVENOUS
  Filled 2022-04-01: qty 8

## 2022-04-01 MED ORDER — TORSEMIDE 20 MG PO TABS
20.0000 mg | ORAL_TABLET | Freq: Every day | ORAL | Status: DC
  Administered 2022-04-02: 20 mg via ORAL
  Filled 2022-04-01: qty 1

## 2022-04-01 NOTE — Progress Notes (Signed)
On DNR status, verbalized  to be DNI instead, MD made aware and claimed to address it when he comes to see her. ?

## 2022-04-01 NOTE — Progress Notes (Signed)
Patient placed on CPAP at this time.  

## 2022-04-01 NOTE — Progress Notes (Signed)
?PROGRESS NOTE ? ? ? ?[name]  FVC:[id number] DOB: 02/11/1946 DOA: 03/30/2022 ?PCP: [name], DO  ? ? ?Brief Narrative:  ?[name] is a 76 y.o. female with past medical history of diastolic heart failure, pulmonary hypertension, history of cryptogenic stroke, CKD stage IIIb, orthostatic hypotension, hyperlipidemia, anemia of chronic disease, history of gastric bypass, restless leg syndrome, depression and obstructive sleep apnea on CPAP presented to hospital to hospital with shortness of breath.  Patient was noted to have new bilateral pulmonary embolism and was started on IV Lasix and IV heparin.  Of note, patient was recently hospitalized 02/03/2022 for heart failure exacerbation.  Cardiac amyloidosis work-up was equivocal.  Plan was to do PYP scan in 4 to 5 months.  Patient had returned home from skilled nursing facility on 03/29/2022 when he started having increasing swelling to the lower extremities with orthopnea, PND and dyspnea on exertion..  In the ED vitals were stable.  BMP showed hypokalemia with a creatinine elevated at 1.5.  Chest x-ray showed bilateral pleural effusion, pulmonary edema and prominent right hilar region.  CTA of the chest showed segmental and subsegmental filling defects in the right upper lobe and left lower lobe.  In the ED, patient was given Lasix and oral potassium and was admitted hospital for further evaluation and treatment. ? ?Assessment and Plan: ? ?Principal Problem: ?  Acute on chronic heart failure with preserved ejection fraction (HFpEF) ([place]) ?Active Problems: ?  Bilateral pulmonary embolism ([place]) ?  Chronic kidney disease, stage 3b ([place]) ?  Orthostatic hypotension ?  History of cerebrovascular accident ?  Hypokalemia ?  OSA on CPAP ?  RLS (restless legs syndrome) ?  Dyspnea ?  Chronic pain syndrome ?  Fatigue ?  ?* Acute on chronic heart failure with preserved ejection fraction (HFpEF)/ ?Moderately severe PAH ?2D echocardiogram from 02/04/2022 with ejection  fraction of 55 to 60%.    2D echocardiogram was not impressive and cardiac MRI was equivocal for amyloidosis.  Plan is to repeat PYP scan in 4 to 5 months for progression.  Patient has received Lasix 80 mg twice daily strict intake charting, and daily weights with improvement in her symptoms today.  Was on board and patient has been initiated on spironolactone.  Midodrine dose has been decreased.  Will likely benefit from sleep study as outpatient. ?  ?Bilateral pulmonary embolism ([place]) ?CTA chest showing segmental and subsegmental pulmonary embolism in the right upper and lower lobe without right heart strain.  Patient was initially on heparin drip which has been changed to Eliquis at this time.    2D echocardiogram done on 03/31/2022 showed LV ejection fraction of 55 to 60% with mildly elevated pulmonary artery systolic pressure at 42 mmhg and LVH. ?  ?Chronic kidney disease, stage 3b ([place]) ?Creatinine of 1.6.  We will continue diuresis.  Check BMP in AM.   ? ?Orthostatic hypotension ?Continue midodrine 5 mg 3 times daily. ?  ?History of cerebrovascular accident ?History of cryptogenic stroke without evidence for atrial fibrillation. Continue Eliquis.   Not on statins due to myalgia. ? ?Hypokalemia ?Improved after replacement.  Potassium of 4.0 today. ?  ?OSA on CPAP ?Continue CPAP nightly. ?  ?Chronic pain syndrome ?Continue Norco.  Patient complains of multiple sites pain including her back hands and legs.  Patient states that she does have gout and was on allopurinol which was stopped February.  Was briefly on colchicine few days prior to coming to hospital.  Uric acid levels are  elevated at 11.1.  Patient states that her joint pain have improved today. ?  ?RLS (restless legs syndrome) ?Continue ropinirole. ? ?Debility, deconditioning,generalized weakness ?Patient was recently at the rehabilitation facility and discharged home couple of days back.  Appears  fatigued weak and deconditioned.  Pending PT OT  evaluation this admission. ?  ? ? DVT prophylaxis: Place TED hose Start: 03/31/22 1209 ?apixaban (ELIQUIS) tablet 10 mg  ?apixaban (ELIQUIS) tablet 5 mg  ? ?Code Status:   ?  Code Status: DNR ? ?Disposition: Unknown at this time.  Patient requesting CIR.  Patient has been using wheelchair at home for ambulation and has been having difficulty transferring.  She was recently at [facility] skilled nursing facility.  PT OT evaluation pending. ? ?Status is: Inpatient ? ?Remains inpatient appropriate because: Need for  diuresis, cardiology following, possible rehabilitation placement. ? ? Family Communication:  ?Spoke with the patient's husband on the phone and updated him about the clinical condition of the patient on 03/31/2022 ? ?Consultants:  ?Cardiology ? ?Procedures:  ?None ? ?Antimicrobials:  ?None ? ?Anti-infectives (From admission, onward)  ? ? None  ? ?  ? ?Subjective: ?Today, patient was seen and examined at bedside.  Feels better with breathing today.  States that her pain in her joints have slightly improved.  Denies any chest pain, nausea vomiting fever chills or increasing dyspnea.  ? ?Objective: ?Vitals:  ? 04/01/22 0411 04/01/22 0430 04/01/22 0751 04/01/22 1100  ?BP:   118/78 (!) 95/56  ?Pulse: 100 96 (!) 105 95  ?Resp: '18 18 17 17  '$ ?Temp:   97.9 ?F (36.6 ?C) 97.8 ?F (36.6 ?C)  ?TempSrc:   Oral Oral  ?SpO2:  98% 98% 97%  ?Weight:  82.4 kg    ?Height:      ? ? ?Intake/Output Summary (Last 24 hours) at 04/01/2022 1257 ?Last data filed at 04/01/2022 0411 ?Gross per 24 hour  ?Intake 316.68 ml  ?Output 3200 ml  ?Net -2883.32 ml  ? ?Filed Weights  ? 03/30/22 2330 04/01/22 0430  ?Weight: 85.3 kg 82.4 kg  ? ? ?Physical Examination: ?Body mass index is 28.44 kg/m?.  ?General exam: Appears calm and comfortable ,Not in distress, deconditioned ?HEENT:PERRL,Oral mucosa moist, distended neck veins ?Respiratory system: Bilateral equal air entry, normal vesicular breath sounds, no wheezes or crackles  ?Cardiovascular  system: S1 & S2 heard, RRR.  Mild tachycardia. ?Gastrointestinal system: Abdomen is nondistended, soft and nontender. No organomegaly or masses felt. Normal bowel sounds heard. ?Central nervous system: Alert and oriented. No focal neurological deficits. ?Extremities: Lower extremity trace edema.  No clubbing ,no cyanosis, distal peripheral pulses palpable. ?Skin: No rashes, lesions or ulcers,no icterus ,no pallor ?MSK: Generalized weakness noted. ? ? ?Data Reviewed:  ? ?CBC: ?Recent Labs  ?Lab 03/30/22 ?1734 04/01/22 ?0119  ?WBC 6.0 5.8  ?HGB 10.1* 9.9*  ?HCT 32.2* 31.5*  ?MCV 101.6* 100.3*  ?PLT 282 246  ? ? ?Basic Metabolic Panel: ?Recent Labs  ?Lab 03/30/22 ?1734 03/31/22 ?0825 04/01/22 ?0119  ?NA 139 138 137  ?K 3.2* 3.5 4.0  ?CL 103 105 99  ?CO2 '27 23 25  '$ ?GLUCOSE 118* 151* 87  ?BUN 41* 35* 37*  ?CREATININE 1.59* 1.68* 1.66*  ?CALCIUM 9.2 8.7* 8.9  ?MG  --  2.0 2.2  ? ? ?Liver Function Tests: ?Recent Labs  ?Lab 03/30/22 ?2134  ?AST 17  ?ALT 15  ?ALKPHOS 89  ?BILITOT 0.8  ?PROT 6.0*  ?ALBUMIN 2.8*  ? ? ? ?Radiology Studies: ?DG Chest 2  View ? ?Result Date: 03/30/2022 ?CLINICAL DATA:  chest pain EXAM: CHEST - 2 VIEW COMPARISON:  CT angiography chest 02/04/2022, chest x-ray 02/04/2022 FINDINGS: Left chest wall wireless cardiac pacemaker. The heart and mediastinal contours are unchanged. Prominent right hilar region. No focal consolidation. Chronic coarsened markings with no overt pulmonary edema. Bilateral pleural effusions. No pneumothorax. No acute osseous abnormality. Reversed total right shoulder arthroplasty. IMPRESSION: 1. Bilateral pleural effusions. 2. Possible mild pulmonary edema. 3. Prominent right hilar region. Underlying mass lesion or lymphadenopathy not excluded. Consider CT chest with intravenous contrast for further evaluation. Electronically Signed   By: Iven Finn M.D.   On: 03/30/2022 19:10  ? ?CT Angio Chest Pulmonary Embolism (PE) W or WO Contrast ? ?Result Date: 03/30/2022 ?CLINICAL DATA:   Pulmonary embolism suspected, high probability. EXAM: CT ANGIOGRAPHY CHEST WITH CONTRAST TECHNIQUE: Multidetector CT imaging of the chest was performed using the standard protocol during bolus administration of intrav

## 2022-04-01 NOTE — Progress Notes (Signed)
BLE venous duplex has been completed.  Preliminary findings given to [place], Therapist, sports. ? ? ?Results can be found under chart review under CV PROC. ?04/01/2022 4:21 PM ?Rayme Bui RVT, RDMS ? ?

## 2022-04-01 NOTE — Evaluation (Signed)
Occupational Therapy Evaluation ?Patient Details ?Name: [name] ?MRN: [id number] ?DOB: 01/20/1946 ?Today's Date: 04/01/2022 ? ? ?History of Present Illness Pt is a 76 y.o. F who presents with increased swelling to BLE's with orthopnea, PND, and dyspnea with exertion. CTA chest showing segmental and subsegmental pulmonary embolism in the right upper and lower lobe without right heart strain. Significant PMH: diastolic heart failure, pulmonary hypertension, history of cryptogenic stroke, CKD stage IIIb, orthostatic hypotension, hyperlipidemia, anemia of chronic disease, history of gastric bypass, restless leg syndrome, depression and obstructive sleep apnea on CPAP.  ? ?Clinical Impression ?  ?Pt presents with recent discharge home from SNF rehab on 4/15 with mobility using wheelchair/RW, able to complete ADLs though required increased time. Prior to most recent admission, pt reports complete independence in all daily tasks. Overall, pt currently requires Setup for UB ADLs, Mod A for LB ADLs and Min A for basic transfers using RW. Extended time spent discussing safety strategies, DME use and areas of assist that may be needed at home. However, pt reports concern over current weakness/fall risk and inquiring about intensive rehab with CIR prior to return home. Based on reported PLOF and rehab potential, pt would benefit from CIR stay. Will continue to follow acutely and update DC recs as appropriate. ? ?TED hose and abdominal binder donned prior to eval. ?BP supine: 104/79 (86) ?BP sitting: 106/71 (80) ?BP standing: 85/66 (66) ?BP after return to bed: 116/71 (84) ?   ? ?Recommendations for follow up therapy are one component of a multi-disciplinary discharge planning process, led by the attending physician.  Recommendations may be updated based on patient status, additional functional criteria and insurance authorization.  ? ?Follow Up Recommendations ? Acute inpatient rehab (3hours/day)  ?  ?Assistance Recommended at  Discharge Intermittent Supervision/Assistance  ?Patient can return home with the following A little help with walking and/or transfers;A little help with bathing/dressing/bathroom;Assistance with cooking/housework;Assist for transportation;Help with stairs or ramp for entrance ? ?  ?Functional Status Assessment ? Patient has had a recent decline in their functional status and demonstrates the ability to make significant improvements in function in a reasonable and predictable amount of time.  ?Equipment Recommendations ? None recommended by OT  ?  ?Recommendations for Other Services Rehab consult ? ? ?  ?Precautions / Restrictions Precautions ?Precautions: Fall;Other (comment) ?Precaution Comments: Watch BP; has TEDs and abdominal binder ?Restrictions ?Weight Bearing Restrictions: No  ? ?  ? ?Mobility Bed Mobility ?Overal bed mobility: Modified Independent ?  ?  ?  ?  ?  ?  ?  ?  ? ?Transfers ?Overall transfer level: Needs assistance ?Equipment used: Rolling walker (2 wheels) ?Transfers: Sit to/from Stand ?Sit to Stand: Min assist ?  ?  ?  ?  ?  ?General transfer comment: assist to stabilize walker due to difficulty standing from bed height ?  ? ?  ?Balance Overall balance assessment: Needs assistance ?Sitting-balance support: Feet supported ?Sitting balance-Leahy Scale: Good ?  ?  ?Standing balance support: Bilateral upper extremity supported ?Standing balance-Leahy Scale: Poor ?  ?  ?  ?  ?  ?  ?  ?  ?  ?  ?  ?  ?   ? ?ADL either performed or assessed with clinical judgement  ? ?ADL Overall ADL's : Needs assistance/impaired ?Eating/Feeding: Independent ?  ?Grooming: Supervision/safety;Standing ?  ?Upper Body Bathing: Set up;Sitting ?  ?Lower Body Bathing: Moderate assistance;Sit to/from stand ?  ?Upper Body Dressing : Set up;Sitting ?  ?Lower Body  Dressing: Moderate assistance;Sit to/from stand ?Lower Body Dressing Details (indicate cue type and reason): difficulty reaching feet, fatigues easily ?Toilet Transfer:  Minimal assistance;Stand-pivot;BSC/3in1;Rolling walker (2 wheels) ?  ?Toileting- Clothing Manipulation and Hygiene: Minimal assistance;Sitting/lateral lean;Sit to/from stand ?  ?  ?  ?  ?General ADL Comments: Pt limited by orthostatic hypotension though minimal symptoms reported; reports weakness, fatigue impacting independence with strong desire to participate in intensive rehab to regain strength. Pt reports her husband can assist at home though he is fearful she will experience falls at home. Educated on progression of endurance, DME use and safety strategies if pt discharges home instead  ? ? ? ?Vision Baseline Vision/History: 1 Wears glasses ?Ability to See in Adequate Light: 0 Adequate ?Patient Visual Report: No change from baseline ?Vision Assessment?: No apparent visual deficits  ?   ?Perception   ?  ?Praxis   ?  ? ?Pertinent Vitals/Pain Pain Assessment ?Pain Assessment: Faces ?Faces Pain Scale: Hurts little more ?Pain Location: R shoulder ?Pain Descriptors / Indicators: Sore ?Pain Intervention(s): Monitored during session, Limited activity within patient's tolerance, Patient requesting pain meds-RN notified  ? ? ? ?Hand Dominance Right ?  ?Extremity/Trunk Assessment Upper Extremity Assessment ?Upper Extremity Assessment: Generalized weakness;RUE deficits/detail;LUE deficits/detail ?RUE Deficits / Details: hx of shoulder replacement last May per pt. Reports some pain and is suspicious that it could be gout-related. hx of peripheral neuropathy ?LUE Deficits / Details: reports gout pain in hand. hx of peripheral neuropathy ?  ?Lower Extremity Assessment ?Lower Extremity Assessment: Defer to PT evaluation ?RLE Sensation: history of peripheral neuropathy ?LLE Sensation: history of peripheral neuropathy ?  ?Cervical / Trunk Assessment ?Cervical / Trunk Assessment: Normal ?  ?Communication Communication ?Communication: No difficulties ?  ?Cognition Arousal/Alertness: Awake/alert ?Behavior During Therapy: [facility] for  tasks assessed/performed ?Overall Cognitive Status: Within Functional Limits for tasks assessed ?  ?  ?  ?  ?  ?  ?  ?  ?  ?  ?  ?  ?  ?  ?  ?  ?General Comments: mild memory deficits as pt could not recall if RN had been in to give pain meds since initially asking for it at start of OT eval ?  ?  ?General Comments  pt with TED hose on and personal abdominal binder donned ? ?  ?Exercises   ?  ?Shoulder Instructions    ? ? ?Home Living Family/patient expects to be discharged to:: Private residence ?Living Arrangements: Spouse/significant other ?Available Help at Discharge: Family;Friend(s);Available 24 hours/day ?Type of Home: House ?Home Access: Level entry ?  ?  ?Home Layout: One level ?  ?  ?Bathroom Shower/Tub: Walk-in shower ?  ?Bathroom Toilet: Handicapped height ?Bathroom Accessibility: Yes ?  ?Home Equipment: Conservation officer, nature (2 wheels);Cane - single point;Shower seat;Wheelchair - manual;BSC/3in1 ?  ?  ?  ? ?  ?Prior Functioning/Environment Prior Level of Function : Needs assist ?  ?  ?  ?  ?  ?  ?Mobility Comments: Was using RW since last admission. Reports not walking much with SNF rehab due to knee buckling and weakness ?ADLs Comments: Prior to admission in Feb, was independent in all ADLs, IADLs - active with various crafting fairs. Since previous admission and SNF rehab, has been easily fatigued with ADLs though able to complete with increased time ?  ? ?  ?  ?OT Problem List: Decreased strength;Decreased activity tolerance;Impaired balance (sitting and/or standing);Decreased cognition;Decreased knowledge of use of DME or AE ?  ?   ?OT Treatment/Interventions: Self-care/ADL  training;Therapeutic exercise;DME and/or AE instruction;Energy conservation;Therapeutic activities;Balance training;Patient/family education  ?  ?OT Goals(Current goals can be found in the care plan section) Acute Rehab OT Goals ?Patient Stated Goal: regain strength, get back to independence, go to rehab ?OT Goal Formulation: With  patient ?Time For Goal Achievement: 04/15/22 ?Potential to Achieve Goals: Good  ?OT Frequency: Min 2X/week ?  ? ?Co-evaluation   ?  ?  ?  ?  ? ?  ?AM-PAC OT "6 Clicks" Daily Activity     ?Outcome Measure H

## 2022-04-01 NOTE — Progress Notes (Addendum)
? ? Advanced Heart Failure Rounding Note ? ?PCP-Cardiologist: Sinclair Grooms, MD  ?AHF: Dr. Aundra Dubin  ? ?Subjective:   ? ?Admitted for CP and LEE.  CTA chest demonstrated segmental and subsegmental PE RUL and LLB, no evidence of right heart strain, +pulmonary edema. Started on heparin gtt and IV Lasix.  ? ?Heparin>>Eliquis. No further pleuritic CP. Stable on RA, O2 sats 96%. Sinus tach, low 100s. BP stable, low 259D systolic.  ? ?Good response to IV Lasix, 3.2L in UOP. Wt down 7 lb. Scr 1.6>>1.7 ? ?K 4.0 ?Mg 2.2  ? ?Breathing ok.  ? ? ?Objective:   ?Weight Range: ?82.4 kg ?Body mass index is 28.44 kg/m?.  ? ?Vital Signs:   ?Temp:  [97.7 ?F (36.5 ?C)-98.1 ?F (36.7 ?C)] 98.1 ?F (36.7 ?C) (04/18 0410) ?Pulse Rate:  [96-112] 96 (04/18 0430) ?Resp:  [12-23] 18 (04/18 0430) ?BP: (95-120)/(48-78) 108/78 (04/18 0410) ?SpO2:  [93 %-100 %] 98 % (04/18 0430) ?Weight:  [82.4 kg] 82.4 kg (04/18 0430) ?  ? ?Weight change: ?Filed Weights  ? 03/30/22 2330 04/01/22 0430  ?Weight: 85.3 kg 82.4 kg  ? ? ?Intake/Output:  ? ?Intake/Output Summary (Last 24 hours) at 04/01/2022 0714 ?Last data filed at 04/01/2022 0411 ?Gross per 24 hour  ?Intake 316.68 ml  ?Output 3200 ml  ?Net -2883.32 ml  ?  ? ? ?Physical Exam  ?  ?General:  Well appearing. No resp difficulty ?HEENT: Normal ?Neck: Supple. JVP to jaw . Carotids 2+ bilat; no bruits. No lymphadenopathy or thyromegaly appreciated. ?Cor: PMI nondisplaced. Regular rhythm and tachy rate. No rubs, gallops or murmurs. ?Lungs: Clear ?Abdomen: Soft, nontender, nondistended. No hepatosplenomegaly. No bruits or masses. Good bowel sounds. ?Extremities: No cyanosis, clubbing, rash, trace-1+ b/l LE edema ?Neuro: Alert & orientedx3, cranial nerves grossly intact. moves all 4 extremities w/o difficulty. Affect pleasant ? ? ?Telemetry  ? ?Sinus tach low 100s  ? ?EKG  ?  ?No new EKG to review  ? ?Labs  ?  ?CBC ?Recent Labs  ?  03/30/22 ?1734 04/01/22 ?0119  ?WBC 6.0 5.8  ?HGB 10.1* 9.9*  ?HCT 32.2* 31.5*   ?MCV 101.6* 100.3*  ?PLT 282 246  ? ?Basic Metabolic Panel ?Recent Labs  ?  03/31/22 ?0825 04/01/22 ?0119  ?NA 138 137  ?K 3.5 4.0  ?CL 105 99  ?CO2 23 25  ?GLUCOSE 151* 87  ?BUN 35* 37*  ?CREATININE 1.68* 1.66*  ?CALCIUM 8.7* 8.9  ?MG 2.0 2.2  ? ?Liver Function Tests ?Recent Labs  ?  03/30/22 ?2134  ?AST 17  ?ALT 15  ?ALKPHOS 89  ?BILITOT 0.8  ?PROT 6.0*  ?ALBUMIN 2.8*  ? ?No results for input(s): LIPASE, AMYLASE in the last 72 hours. ?Cardiac Enzymes ?No results for input(s): CKTOTAL, CKMB, CKMBINDEX, TROPONINI in the last 72 hours. ? ?BNP: ?BNP (last 3 results) ?Recent Labs  ?  02/03/22 ?1544 03/18/22 ?1156 03/30/22 ?1732  ?BNP 841.9* 1,209.5* 1,405.4*  ? ? ?ProBNP (last 3 results) ?No results for input(s): PROBNP in the last 8760 hours. ? ? ?D-Dimer ?No results for input(s): DDIMER in the last 72 hours. ?Hemoglobin A1C ?No results for input(s): HGBA1C in the last 72 hours. ?Fasting Lipid Panel ?No results for input(s): CHOL, HDL, LDLCALC, TRIG, CHOLHDL, LDLDIRECT in the last 72 hours. ?Thyroid Function Tests ?No results for input(s): TSH, T4TOTAL, T3FREE, THYROIDAB in the last 72 hours. ? ?Invalid input(s): FREET3 ? ?Other results: ? ? ?Imaging  ? ? ?ECHOCARDIOGRAM COMPLETE ? ?Result Date: 03/31/2022 ?  ECHOCARDIOGRAM REPORT   Patient Name:   [name] Tabet Date of Exam: 03/31/2022 Medical Rec #:  [id number]    Height:       67.0 in Accession #:    [id number]   Weight:       188.1 lb Date of Birth:  [birth year]     BSA:          1.970 m? Patient Age:    76 years     BP:           97/65 mmHg Patient Gender: F            HR:           104 bpm. Exam Location:  Inpatient Procedure: 2D Echo, Cardiac Doppler and Color Doppler Indications:    CHF  History:        Patient has prior history of Echocardiogram examinations, most                 recent 02/04/2022. CHF; Risk Factors:Sleep Apnea.  Sonographer:    Jefferey Pica Referring Phys: [id number] LINDSAY NICOLE FINCH IMPRESSIONS  1. Left ventricular ejection fraction, by  estimation, is 55 to 60%. The left ventricle has normal function. The left ventricle has no regional wall motion abnormalities. There is mild left ventricular hypertrophy. Indeterminate diastolic filling due to E-A fusion.  2. Right ventricular systolic function is normal. The right ventricular size is normal. There is mildly elevated pulmonary artery systolic pressure. The estimated right ventricular systolic pressure is 91.6 mmHg.  3. Left atrial size was moderately dilated.  4. Right atrial size was moderately dilated.  5. The mitral valve is normal in structure. Trivial mitral valve regurgitation. No evidence of mitral stenosis.  6. The aortic valve is tricuspid. Aortic valve regurgitation is moderate. Mild to moderate aortic valve stenosis. Aortic valve mean gradient measures 18.0 mmHg, AVA 1.48 cm^2.  7. The inferior vena cava is normal in size with <50% respiratory variability, suggesting right atrial pressure of 8 mmHg. FINDINGS  Left Ventricle: Left ventricular ejection fraction, by estimation, is 55 to 60%. The left ventricle has normal function. The left ventricle has no regional wall motion abnormalities. The left ventricular internal cavity size was normal in size. There is  mild left ventricular hypertrophy. Indeterminate diastolic filling due to E-A fusion. Right Ventricle: The right ventricular size is normal. No increase in right ventricular wall thickness. Right ventricular systolic function is normal. There is mildly elevated pulmonary artery systolic pressure. The tricuspid regurgitant velocity is 2.93  m/s, and with an assumed right atrial pressure of 8 mmHg, the estimated right ventricular systolic pressure is 38.4 mmHg. Left Atrium: Left atrial size was moderately dilated. Right Atrium: Right atrial size was moderately dilated. Pericardium: There is no evidence of pericardial effusion. Mitral Valve: The mitral valve is normal in structure. Mild mitral annular calcification. Trivial mitral  valve regurgitation. No evidence of mitral valve stenosis. Tricuspid Valve: The tricuspid valve is normal in structure. Tricuspid valve regurgitation is mild. Aortic Valve: The aortic valve is tricuspid. Aortic valve regurgitation is moderate. Aortic regurgitation PHT measures 291 msec. Mild to moderate aortic stenosis is present. Aortic valve mean gradient measures 18.0 mmHg. Aortic valve peak gradient measures 27.0 mmHg. Aortic valve area, by VTI measures 1.48 cm?. Pulmonic Valve: The pulmonic valve was normal in structure. Pulmonic valve regurgitation is not visualized. Aorta: The aortic root is normal in size and structure. Venous: The inferior vena cava is normal in size with less  than 50% respiratory variability, suggesting right atrial pressure of 8 mmHg. IAS/Shunts: No atrial level shunt detected by color flow Doppler.  LEFT VENTRICLE PLAX 2D LVIDd:         4.50 cm   Diastology LVIDs:         3.10 cm   LV e' medial:   12.00 cm/s LV PW:         1.20 cm   LV E/e' medial: 13.4 LV IVS:        1.20 cm LVOT diam:     1.90 cm LV SV:         72 LV SV Index:   36 LVOT Area:     2.84 cm?  RIGHT VENTRICLE             IVC RV Basal diam:  3.30 cm     IVC diam: 1.80 cm RV Mid diam:    3.20 cm RV S prime:     13.90 cm/s TAPSE (M-mode): 1.6 cm LEFT ATRIUM             Index        RIGHT ATRIUM           Index LA diam:        4.10 cm 2.08 cm/m?   RA Area:     22.60 cm? LA Vol (A2C):   89.8 ml 45.58 ml/m?  RA Volume:   71.90 ml  36.50 ml/m? LA Vol (A4C):   87.3 ml 44.31 ml/m? LA Biplane Vol: 88.5 ml 44.92 ml/m?  AORTIC VALVE                    PULMONIC VALVE AV Area (Vmax):    1.64 cm?     PV Vmax:       1.35 m/s AV Area (VTI):     1.48 cm?     PV Peak grad:  7.3 mmHg AV Vmax:           260.00 cm/s AV VTI:            0.486 m AV Peak Grad:      27.0 mmHg AV Mean Grad:      18.0 mmHg LVOT Vmax:         150.00 cm/s LVOT Vmean:        95.000 cm/s LVOT VTI:          0.253 m LVOT/AV VTI ratio: 0.52 AI PHT:            291 msec  AORTA  Ao Asc diam: 3.50 cm MITRAL VALVE                TRICUSPID VALVE MV Area (PHT): 5.79 cm?     TR Peak grad:   34.3 mmHg MV Decel Time: 131 msec     TR Vmax:        293.00 cm/s MV E velocity: 161.00 cm/s

## 2022-04-01 NOTE — Progress Notes (Signed)
OT Cancellation Note ? ?Patient Details ?Name: [name] ?MRN: [id number] ?DOB: 06-Mar-1946 ? ? ?Cancelled Treatment:    Reason Eval/Treat Not Completed: Other (comment) Pt awaiting abdominal binder and TED hose prior to OOB activities; requested OT to check back ? ?Layla Maw ?04/01/2022, 9:53 AM ?

## 2022-04-01 NOTE — TOC Initial Note (Signed)
Transition of Care (TOC) - Initial/Assessment Note  ? ? ?Patient Details  ?Name: [name] ?MRN: [id number] ?Date of Birth: [birth year] ? ?Transition of Care [facility]) CM/SW Contact:    ?Marcheta Grammes Rexene Alberts, RN ?Phone Number: [phone] ?04/01/2022, 4:08 PM ? ?Clinical Narrative:                 ? ?HF TOC CM spoke to pt and states she does want to go IP rehab. Pt had rehab in the past. Active with [facility], Will notify rep, Ramond Marrow pt plans to go to IP rehab. Pt has needed DME in home. Husband in the home to assist with care and provide transportation to appts.  ? ? ? ? ?Expected Discharge Plan: [place] ?Barriers to Discharge: Continued Medical Work up ? ? ?Patient Goals and CMS Choice ?  ?CMS Medicare.gov Compare Post Acute Care list provided to:: Patient ?Choice offered to / list presented to : Patient ? ?Expected Discharge Plan and Services ?Expected Discharge Plan: [place] ?  ?Discharge Planning Services: CM Consult ?Post Acute Care Choice: IP Rehab ?Living arrangements for the past 2 months: [place] ?                ?  ?  ?  ?  ?  ?  ?[place] Agency: [place] ([place] ?  ?  ?  ? ?Prior Living Arrangements/Services ?Living arrangements for the past 2 months: [place] ?Lives with:: Spouse ?Patient language and need for interpreter reviewed:: Yes ?Do you feel safe going back to the place where you live?: Yes      ?Need for Family Participation in Patient Care: Yes (Comment) ?Care giver support system in place?: Yes (comment) ?Current home services: DME (rolling walker with seat, RW, bedside commode, shower chair, CPAP) ?Criminal Activity/Legal Involvement Pertinent to Current Situation/Hospitalization: No - Comment as needed ? ?Activities of Daily Living ?Home Assistive Devices/Equipment: Chana Bode (specify type), Wheelchair, Cane (specify quad or straight) ?ADL Screening (condition at time of admission) ?Patient's cognitive ability adequate to  safely complete daily activities?: Yes ?Is the patient deaf or have difficulty hearing?: No ?Does the patient have difficulty seeing, even when wearing glasses/contacts?: No ?Does the patient have difficulty concentrating, remembering, or making decisions?: No ?Patient able to express need for assistance with ADLs?: Yes ?Does the patient have difficulty dressing or bathing?: No ?Independently performs ADLs?: Yes (appropriate for developmental age) ?Does the patient have difficulty walking or climbing stairs?: Yes ?Weakness of Legs: Both ?Weakness of Arms/Hands: None ? ?Permission Sought/Granted ?Permission sought to share information with : Case Manager, Family Supports, PCP ?Permission granted to share information with : Yes, Verbal Permission Granted ? Share Information with NAME: Dominica Severin Drakes ? Permission granted to share info w AGENCY: IP Rehab, Home Health ?   ?   ? ?Emotional Assessment ?Appearance:: Appears stated age ?Attitude/Demeanor/Rapport: Engaged ?Affect (typically observed): Accepting ?Orientation: : Oriented to Self, Oriented to Place, Oriented to  Time, Oriented to Situation ?  ?Psych Involvement: No (comment) ? ?Admission diagnosis:  Acute on chronic diastolic congestive heart failure ([place]) [I50.33] ?Acute on chronic heart failure with preserved ejection fraction (HFpEF) ([place]) [I50.33] ?Acute pulmonary embolism, unspecified pulmonary embolism type, unspecified whether acute cor pulmonale present ([place]) [I26.99] ?Patient Active Problem List  ? Diagnosis Date Noted  ? Chronic kidney disease, stage 3b ([place]) 03/31/2022  ? Hypokalemia 03/31/2022  ? Fatigue 03/31/2022  ? Acute on chronic heart failure with preserved  ejection fraction (HFpEF) ([place]) 03/30/2022  ? Bilateral pulmonary embolism ([place]) 03/30/2022  ? Microhematuria 03/04/2022  ? Recurrent UTI 03/03/2022  ? H/O urinary retention 03/03/2022  ? Chronic pain syndrome 03/03/2022  ? Neuropathy 03/03/2022  ? Orthostatic hypotension 03/03/2022  ? Debility  02/15/2022  ? Acute kidney injury superimposed on chronic kidney disease ([place]) 02/11/2022  ? Class 1 obesity 02/10/2022  ? Vertigo 02/10/2022  ? Acute on chronic diastolic CHF (congestive heart failure) ([place])   ? Dyspnea 02/03/2022  ? Non-intractable cyclical vomiting with nausea 01/06/2022  ? 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 Chest pain on breathing 06/20/2021  ? Symptom of leg swelling 06/20/2021  ? Shortness of breath 06/20/2021  ? Attention deficit hyperactivity disorder 04/05/2021  ? Disorder of lip 04/05/2021  ? Essential hypertension 04/05/2021  ? Gout 04/05/2021  ? History of cerebrovascular accident 04/05/2021  ? Lumbar spondylosis 04/05/2021  ? Migraine 04/05/2021  ? Other allergic rhinitis 04/05/2021  ? Other long term (current) drug therapy 04/05/2021  ? Pure hypercholesterolemia 04/05/2021  ? Rotator cuff tear 04/05/2021  ? Skin sensation disturbance 04/05/2021  ? Vitamin B12 deficiency (non anemic) 04/05/2021  ? Vitamin D deficiency 04/05/2021  ? Acquired iron deficiency anemia due to decreased absorption 04/24/2020  ? Low ferritin 03/27/2020  ? Status post gastric bypass for obesity 11/23/2019  ? Status post bariatric surgery 02/02/2018  ? Other symptoms and signs involving the nervous system 02/02/2018  ? Familial hypercholesterolemia 01/22/2018  ? Paroxysmal atrial fibrillation ([place]) 01/22/2018  ? Torticollis, acquired 10/23/2016  ? Asthma 08/04/2016  ? Other secondary pulmonary hypertension ([place]) 06/11/2016  ? 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 Chronic migraine without aura without status migrainosus, not intractable 03/19/2016  ? Chronic migraine without aura, with intractable migraine, so stated, with status migrainosus 01/22/2016  ? History of gastric bypass 12/05/2015  ? RLS (restless legs syndrome) 08/16/2015  ? Vitamin B12 deficiency anemia due to intrinsic factor deficiency 08/16/2015  ? Iron deficiency anemia  08/16/2015  ? Osteoporosis 08/16/2015  ? OSA on CPAP 08/16/2015  ? Primary osteoarthritis of left knee 04/03/2015  ? Obstructive sleep apnea 10/19/2014  ? 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 Hyperlipidemia 10/19/2014  ? Morbid obesity ([place]) 10/19/2014  ? ?PCP:  [name], DO ?Pharmacy:   ?[id number] - , [place] ?[address] ?[place] [address] ?Phone: [phone] Fax: [phone] ? ?[id number] [place], [place] - [address] ?[id number]?Phone: [phone]: [phone]? ?[facility] [address]?[address]?Phone: [phone]: [phone]? ? ? ? ?Social Determinants of Health (SDOH) Interventions ?  ? ?Readmission Risk Interventions ?   ? View : No data to display.  ?  ?  ?  ? ? ? ?

## 2022-04-01 NOTE — Progress Notes (Signed)
BLE venous duplex preliminary result- + DVT on the left leg as relayed by the tech. MD made aware thru secure chat. ?

## 2022-04-01 NOTE — Discharge Instructions (Signed)
Information on my medicine - ELIQUIS? (apixaban) ? ?Why was Eliquis? prescribed for you? ?Eliquis? was prescribed to treat blood clots that may have been found in the veins of your legs (deep vein thrombosis) or in your lungs (pulmonary embolism) and to reduce the risk of them occurring again. ? ?What do You need to know about Eliquis? ? ?The starting dose is 10 mg (two 5 mg tablets) taken TWICE daily for the FIRST SEVEN (7) DAYS, then on 04/07/22 the dose is reduced to ONE 5 mg tablet taken TWICE daily.  Eliquis? may be taken with or without food.  ? ?Try to take the dose about the same time in the morning and in the evening. If you have difficulty swallowing the tablet whole please discuss with your pharmacist how to take the medication safely. ? ?Take Eliquis? exactly as prescribed and DO NOT stop taking Eliquis? without talking to the doctor who prescribed the medication.  Stopping may increase your risk of developing a new blood clot.  Refill your prescription before you run out. ? ?After discharge, you should have regular check-up appointments with your healthcare provider that is prescribing your Eliquis?. ?   ?What do you do if you miss a dose? ?If a dose of ELIQUIS? is not taken at the scheduled time, take it as soon as possible on the same day and twice-daily administration should be resumed. The dose should not be doubled to make up for a missed dose. ? ?Important Safety Information ?A possible side effect of Eliquis? is bleeding. You should call your healthcare provider right away if you experience any of the following: ?Bleeding from an injury or your nose that does not stop. ?Unusual colored urine (red or dark brown) or unusual colored stools (red or black). ?Unusual bruising for unknown reasons. ?A serious fall or if you hit your head (even if there is no bleeding). ? ?Some medicines may interact with Eliquis? and might increase your risk of bleeding or clotting while on Eliquis?Marland Kitchen To help avoid this,  consult your healthcare provider or pharmacist prior to using any new prescription or non-prescription medications, including herbals, vitamins, non-steroidal anti-inflammatory drugs (NSAIDs) and supplements. ? ?This website has more information on Eliquis? (apixaban): http://www.eliquis.com/eliquis/home  ?

## 2022-04-01 NOTE — Progress Notes (Signed)
Compression socks  above the knee applied by NT. Measurement of the legs  done and documented in the flow sheet. ?

## 2022-04-01 NOTE — Evaluation (Signed)
Physical Therapy Evaluation ?Patient Details ?Name: [name] ?MRN: [id number] ?DOB: 04/24/1946 ?Today's Date: 04/01/2022 ? ?History of Present Illness ? Pt is a 76 y.o. F who presents with increased swelling to BLE's with orthopnea, PND, and dyspnea with exertion. CTA chest showing segmental and subsegmental pulmonary embolism in the right upper and lower lobe without right heart strain. Significant PMH: diastolic heart failure, pulmonary hypertension, history of cryptogenic stroke, CKD stage IIIb, orthostatic hypotension, hyperlipidemia, anemia of chronic disease, history of gastric bypass, restless leg syndrome, depression and obstructive sleep apnea on CPAP.  ?Clinical Impression ? PTA, pt with recent discharge from [facility] 4/15, was using RW for mobility and required assist for ADL's. Donned TED hose and abdominal binder prior to mobility. Pt requiring min-mod assist for lower body dressing. Ambulating 120 ft with a walker at a min guard assist level. SpO2 100% on RA, HR stable; does not meet parameters for orthostatic hypotension and was asymptomatic. Pt displays generalized weakness, sensation impairments (chronic), and impaired balance. Will benefit from follow up HHPT to address; has good family support at home. ? ?Vitals: ?Supine: 102/59 (72) ?Sitting: 95/56 (67) ?Standing: 89/67 (75) ?Sitting post mobility: 120/70 (84) ?   ? ?Recommendations for follow up therapy are one component of a multi-disciplinary discharge planning process, led by the attending physician.  Recommendations may be updated based on patient status, additional functional criteria and insurance authorization. ? ?Follow Up Recommendations Home health PT ? ?  ?Assistance Recommended at Discharge PRN  ?Patient can return home with the following ? A little help with walking and/or transfers;A little help with bathing/dressing/bathroom;Assistance with cooking/housework;Assist for transportation;Help with stairs or ramp for entrance ? ?   ?Equipment Recommendations None recommended by PT  ?Recommendations for Other Services ?    ?  ?Functional Status Assessment Patient has had a recent decline in their functional status and demonstrates the ability to make significant improvements in function in a reasonable and predictable amount of time.  ? ?  ?Precautions / Restrictions Precautions ?Precautions: Fall;Other (comment) ?Precaution Comments: Watch BP; has TEDs and abdominal binder ?Restrictions ?Weight Bearing Restrictions: No  ? ?  ? ?Mobility ? Bed Mobility ?Overal bed mobility: Modified Independent ?  ?  ?  ?  ?  ?  ?  ?  ? ?Transfers ?Overall transfer level: Needs assistance ?Equipment used: Rolling walker (2 wheels) ?Transfers: Sit to/from Stand ?Sit to Stand: Min guard ?  ?  ?  ?  ?  ?  ?  ? ?Ambulation/Gait ?Ambulation/Gait assistance: Min guard ?Gait Distance (Feet): 120 Feet ?Assistive device: Rolling walker (2 wheels) ?Gait Pattern/deviations: Step-through pattern, Decreased stride length, Knee flexed in stance - right, Knee flexed in stance - left, Steppage, Decreased dorsiflexion - right, Decreased dorsiflexion - left ?Gait velocity: decreased ?Gait velocity interpretation: <1.8 ft/sec, indicate of risk for recurrent falls ?  ?General Gait Details: Pt with mild steppage pattern and decreased bilateral heel strike in setting of peripheral neuropathy, min guard for safety, no overt LOB ? ?Stairs ?  ?  ?  ?  ?  ? ?Wheelchair Mobility ?  ? ?Modified Rankin (Stroke Patients Only) ?  ? ?  ? ?Balance Overall balance assessment: Needs assistance ?Sitting-balance support: Feet supported ?Sitting balance-Leahy Scale: Good ?Sitting balance - Comments: donning clothes with no LOB ?  ?Standing balance support: Bilateral upper extremity supported ?Standing balance-Leahy Scale: Poor ?Standing balance comment: reliant on RW ?  ?  ?  ?  ?  ?  ?  ?  ?  ?  ?  ?   ? ? ? ?  Pertinent Vitals/Pain Pain Assessment ?Pain Assessment: No/denies pain  ? ? ?Home Living  Family/patient expects to be discharged to:: Private residence ?Living Arrangements: Spouse/significant other ?Available Help at Discharge: Family;Friend(s);Available 24 hours/day ?Type of Home: House ?Home Access: Level entry ?  ?  ?  ?Home Layout: One level ?Home Equipment: Conservation officer, nature (2 wheels);Cane - single point;Shower seat;Wheelchair - manual ?   ?  ?Prior Function Prior Level of Function : Needs assist ?  ?  ?  ?  ?  ?  ?Mobility Comments: using RW ?ADLs Comments: requiring assist ?  ? ? ?Hand Dominance  ? Dominant Hand: Right ? ?  ?Extremity/Trunk Assessment  ? Upper Extremity Assessment ?Upper Extremity Assessment: Defer to OT evaluation ?  ? ?Lower Extremity Assessment ?Lower Extremity Assessment: Generalized weakness;RLE deficits/detail;LLE deficits/detail ?RLE Sensation: history of peripheral neuropathy ?LLE Sensation: history of peripheral neuropathy ?  ? ?   ?Communication  ? Communication: No difficulties  ?Cognition Arousal/Alertness: Awake/alert ?Behavior During Therapy: [facility] for tasks assessed/performed ?Overall Cognitive Status: Within Functional Limits for tasks assessed ?  ?  ?  ?  ?  ?  ?  ?  ?  ?  ?  ?  ?  ?  ?  ?  ?  ?  ?  ? ?  ?General Comments   ? ?  ?Exercises    ? ?Assessment/Plan  ?  ?PT Assessment Patient needs continued PT services  ?PT Problem List Decreased strength;Decreased activity tolerance;Decreased balance;Decreased mobility ? ?   ?  ?PT Treatment Interventions DME instruction;Gait training;Stair training;Functional mobility training;Therapeutic activities;Therapeutic exercise;Balance training;Patient/family education   ? ?PT Goals (Current goals can be found in the Care Plan section)  ?Acute Rehab PT Goals ?Patient Stated Goal: get stronger ?PT Goal Formulation: With patient ?Time For Goal Achievement: 04/15/22 ?Potential to Achieve Goals: Good ? ?  ?Frequency Min 3X/week ?  ? ? ?Co-evaluation   ?  ?  ?  ?  ? ? ?  ?AM-PAC PT "6 Clicks" Mobility  ?Outcome Measure Help needed  turning from your back to your side while in a flat bed without using bedrails?: None ?Help needed moving from lying on your back to sitting on the side of a flat bed without using bedrails?: None ?Help needed moving to and from a bed to a chair (including a wheelchair)?: A Little ?Help needed standing up from a chair using your arms (e.g., wheelchair or bedside chair)?: A Little ?Help needed to walk in hospital room?: A Little ?Help needed climbing 3-5 steps with a railing? : A Lot ?6 Click Score: 19 ? ?  ?End of Session Equipment Utilized During Treatment: Gait belt ?Activity Tolerance: Patient tolerated treatment well ?Patient left: in chair;with call bell/phone within reach;with chair alarm set ?Nurse Communication: Mobility status ?PT Visit Diagnosis: Unsteadiness on feet (R26.81);Muscle weakness (generalized) (M62.81);Difficulty in walking, not elsewhere classified (R26.2) ?  ? ?Time: 1051-1130 ?PT Time Calculation (min) (ACUTE ONLY): 39 min ? ? ?Charges:   PT Evaluation ?$PT Eval Moderate Complexity: 1 Mod ?PT Treatments ?$Therapeutic Activity: 23-37 mins ?  ?   ? ? ?Wyona Almas, PT, DPT ?Acute Rehabilitation Services ?Pager [phone] ?Office [phone] ? ? ?Carloine Margo Aye ?04/01/2022, 1:28 PM ? ?

## 2022-04-02 DIAGNOSIS — I2699 Other pulmonary embolism without acute cor pulmonale: Secondary | ICD-10-CM | POA: Diagnosis not present

## 2022-04-02 DIAGNOSIS — I5033 Acute on chronic diastolic (congestive) heart failure: Secondary | ICD-10-CM | POA: Diagnosis not present

## 2022-04-02 DIAGNOSIS — N1832 Chronic kidney disease, stage 3b: Secondary | ICD-10-CM | POA: Diagnosis not present

## 2022-04-02 DIAGNOSIS — G894 Chronic pain syndrome: Secondary | ICD-10-CM | POA: Diagnosis not present

## 2022-04-02 LAB — CBC
HCT: 31.9 % — ABNORMAL LOW (ref 36.0–46.0)
Hemoglobin: 10 g/dL — ABNORMAL LOW (ref 12.0–15.0)
MCH: 31.3 pg (ref 26.0–34.0)
MCHC: 31.3 g/dL (ref 30.0–36.0)
MCV: 99.7 fL (ref 80.0–100.0)
Platelets: 217 10*3/uL (ref 150–400)
RBC: 3.2 MIL/uL — ABNORMAL LOW (ref 3.87–5.11)
RDW: 13.9 % (ref 11.5–15.5)
WBC: 6.9 10*3/uL (ref 4.0–10.5)
nRBC: 0 % (ref 0.0–0.2)

## 2022-04-02 LAB — MAGNESIUM: Magnesium: 2.1 mg/dL (ref 1.7–2.4)

## 2022-04-02 LAB — BASIC METABOLIC PANEL
Anion gap: 10 (ref 5–15)
BUN: 40 mg/dL — ABNORMAL HIGH (ref 8–23)
CO2: 27 mmol/L (ref 22–32)
Calcium: 9.1 mg/dL (ref 8.9–10.3)
Chloride: 100 mmol/L (ref 98–111)
Creatinine, Ser: 1.73 mg/dL — ABNORMAL HIGH (ref 0.44–1.00)
GFR, Estimated: 31 mL/min — ABNORMAL LOW (ref >60)
Glucose, Bld: 93 mg/dL (ref 70–99)
Potassium: 4.6 mmol/L (ref 3.5–5.1)
Sodium: 137 mmol/L (ref 135–145)

## 2022-04-02 MED ORDER — MIDODRINE HCL 5 MG PO TABS
10.0000 mg | ORAL_TABLET | Freq: Three times a day (TID) | ORAL | Status: DC
Start: 2022-04-02 — End: 2022-04-04
  Administered 2022-04-02 – 2022-04-04 (×7): 10 mg via ORAL
  Filled 2022-04-02 (×7): qty 2

## 2022-04-02 MED ORDER — PREDNISONE 20 MG PO TABS
40.0000 mg | ORAL_TABLET | Freq: Every day | ORAL | Status: DC
  Administered 2022-04-03 – 2022-04-04 (×2): 40 mg via ORAL
  Filled 2022-04-02 (×2): qty 2

## 2022-04-02 MED ORDER — COLCHICINE 0.6 MG PO TABS: 0.6000 mg | ORAL_TABLET | Freq: Every day | ORAL | Status: DC

## 2022-04-02 NOTE — TOC Progression Note (Signed)
Transition of Care (TOC) - Progression Note  ? ? ?Patient Details  ?Name: [name] ?MRN: [id number] ?Date of Birth: 1946-05-19 ? ?Transition of Care (TOC) CM/SW Contact  ?Carles Collet, RN ?Phone Number: ?04/02/2022, 10:24 AM ? ?Clinical Narrative:   Verified w Atlee Abide at [facility] that patient was active PTA. ?Spoke w Mickel Baas, [facility] IR, who will review case for potential CIR consideration, and reached out to PT to re-eval.  ? ? ? ?Expected Discharge Plan: [place] ?Barriers to Discharge: Continued Medical Work up ? ?Expected Discharge Plan and Services ?Expected Discharge Plan: [place] ?  ?Discharge Planning Services: CM Consult ?Post Acute Care Choice: IP Rehab ?Living arrangements for the past 2 months: [place] ?                ?  ?  ?  ?  ?  ?  ?[place]: [place] ?  ?  ?  ? ? ?Social Determinants of Health (SDOH) Interventions ?  ? ?Readmission Risk Interventions ?   ? View : No data to display.  ?  ?  ?  ? ? ?

## 2022-04-02 NOTE — Progress Notes (Signed)
Patient placed on CPAP at this time.  

## 2022-04-02 NOTE — Progress Notes (Signed)
Patient had a 6 beat run of Vtach at [facility], converted back to NSR after and was asymptomatic. MD Pokhrel made aware, no new orders at this time. Will continue to monitor.  ?

## 2022-04-02 NOTE — Care Management Important Message (Signed)
Important Message ? ?Patient Details  ?Name: [name] ?MRN: [id number] ?Date of Birth: July 19, 1946 ? ? ?Medicare Important Message Given:  Yes ? ? ? ? ?Deshara Rossi ?04/02/2022, 3:06 PM ?

## 2022-04-02 NOTE — Plan of Care (Signed)
?  Problem: Education: ?Goal: Ability to verbalize understanding of medication therapies will improve ?Outcome: Progressing ?  ?Problem: Education: ?Goal: Knowledge of General Education information will improve ?Description: Including pain rating scale, medication(s)/side effects and non-pharmacologic comfort measures ?Outcome: Progressing ?  ?Problem: Clinical Measurements: ?Goal: Will remain free from infection ?Outcome: Progressing ?  ?Problem: Nutrition: ?Goal: Adequate nutrition will be maintained ?Outcome: Progressing ?  ?Problem: Pain Managment: ?Goal: General experience of comfort will improve ?Outcome: Progressing ?  ?

## 2022-04-02 NOTE — Progress Notes (Signed)
Physical Therapy Treatment ?Patient Details ?Name: [name] ?MRN: [id number] ?DOB: [birth year] ?Today's Date: 04/02/2022 ? ? ?History of Present Illness Pt is a 76 y.o. F who presents 03/30/22 with increased swelling to BLE's with orthopnea, PND, and dyspnea with exertion. CTA chest showing segmental and subsegmental pulmonary embolism in the right upper and lower lobe without right heart strain. Pt also with L leg DVT. Significant PMH: diastolic heart failure, pulmonary hypertension, history of cryptogenic stroke, CKD stage IIIb, orthostatic hypotension, hyperlipidemia, anemia of chronic disease, history of gastric bypass, restless leg syndrome, depression and obstructive sleep apnea on CPAP. ? ?  ?PT Comments  ? ? Pt reports that prior to her admission in which she was discharged to a SNF, she was IND without DME, often participating in craft shows. Currently, pt is unable to take x1 step without UE support without LOB needing modA to recover. Pt even required modA to ambulate short distances with 1 UE support on PT and the other UE intermittently on the wall rail. She displays deficits in sensation in her feet (L>R) that impact her heel strike and feet placement, impacting her balance. In addition, she displays lower extremity weakness, with L knee instability noted during gait and poor quads eccentric control (L>R) noted with stand > sit transfers. Educated pt on SAQ and LAQ exercises with eccentric control to improve her leg strength. Pt is very motivated to improve and has had a significant functional decline compared to her PLOF, thus updated her recs to AIR. Will continue to follow acutely. ? ?  ?Recommendations for follow up therapy are one component of a multi-disciplinary discharge planning process, led by the attending physician.  Recommendations may be updated based on patient status, additional functional criteria and insurance authorization. ? ?Follow Up Recommendations ? Acute inpatient rehab  (3hours/day) ?  ?  ?Assistance Recommended at Discharge Intermittent Supervision/Assistance  ?Patient can return home with the following A little help with bathing/dressing/bathroom;Assistance with cooking/housework;Assist for transportation;Help with stairs or ramp for entrance;A little help with walking and/or transfers ?  ?Equipment Recommendations ? None recommended by PT  ?  ?Recommendations for Other Services Rehab consult ? ? ?  ?Precautions / Restrictions Precautions ?Precautions: Fall;Other (comment) ?Precaution Comments: Watch BP; has TEDs and abdominal binder ?Restrictions ?Weight Bearing Restrictions: No  ?  ? ?Mobility ? Bed Mobility ?Overal bed mobility: Modified Independent ?  ?  ?  ?  ?  ?  ?General bed mobility comments: Pt able to perform all bed mobility aspects without assistance. ?  ? ?Transfers ?Overall transfer level: Needs assistance ?Equipment used: Rolling walker (2 wheels) ?Transfers: Sit to/from Stand ?Sit to Stand: Min assist ?  ?  ?  ?  ?  ?General transfer comment: Cues to scoot to EOB or chair, minA to power up to stand with x1 rep from EOB and x6 reps from recliner. Pt using 1 UE to push up from chair. ?  ? ?Ambulation/Gait ?Ambulation/Gait assistance: Min guard, Mod assist ?Gait Distance (Feet): 145 Feet ?Assistive device: 1 person hand held assist, Rolling walker (2 wheels) (wall rail) ?Gait Pattern/deviations: Step-through pattern, Decreased stride length, Knee flexed in stance - right, Knee flexed in stance - left, Steppage, Decreased dorsiflexion - right, Decreased dorsiflexion - left, Narrow base of support ?Gait velocity: decreased ?Gait velocity interpretation: <1.31 ft/sec, indicative of household ambulator ?  ?General Gait Details: Pt with mild steppage pattern and decreased bilateral heel strike in setting of peripheral neuropathy. Pt with noticable decreased L knee  stability with difficulty placing L foot, pt reporting numbness more in L foot than R with recent DVT and  edema. Pt able to ambulate with RW at a min guard assist level, but pt unable to take x1 step without UE support without LOB needing modA to recover. Pt requiring modA to ambulate with L UE support on PT or L UE support on PT with R UE support on wall rail, displaying a narrow BOS needing cues to correct moderately. ? ? ?Stairs ?  ?  ?  ?  ?  ? ? ?Wheelchair Mobility ?  ? ?Modified Rankin (Stroke Patients Only) ?  ? ? ?  ?Balance Overall balance assessment: Needs assistance ?Sitting-balance support: Feet supported ?Sitting balance-Leahy Scale: Good ?  ?  ?Standing balance support: Single extremity supported, Bilateral upper extremity supported, During functional activity ?Standing balance-Leahy Scale: Poor ?Standing balance comment: Reliant on UE support, needing modA to ambulate with 1 UE support. Unable to step without UE support. ?  ?  ?  ?  ?  ?  ?  ?  ?  ?  ?  ?  ? ?  ?Cognition Arousal/Alertness: Awake/alert ?Behavior During Therapy: [facility] for tasks assessed/performed ?Overall Cognitive Status: Within Functional Limits for tasks assessed ?  ?  ?  ?  ?  ?  ?  ?  ?  ?  ?  ?  ?  ?  ?  ?  ?  ?  ?  ? ?  ?Exercises General Exercises - Lower Extremity ?Long Arc Quad: AROM, Strengthening, Both, 10 reps, Seated (with eccentric control) ?Other Exercises ?Other Exercises: sit <> stand 6x from recliner only using L UE to assist, cues to control descent with pt not flexing L knee until the end ? ?  ?General Comments General comments (skin integrity, edema, etc.): VSS ?  ?  ? ?Pertinent Vitals/Pain Pain Assessment ?Pain Assessment: Faces ?Faces Pain Scale: Hurts a little bit ?Pain Location: L leg ?Pain Descriptors / Indicators: Aching ?Pain Intervention(s): Limited activity within patient's tolerance, Monitored during session, Repositioned  ? ? ?Home Living   ?  ?  ?  ?  ?  ?  ?  ?  ?  ?   ?  ?Prior Function    ?  ?  ?   ? ?PT Goals (current goals can now be found in the care plan section) Acute Rehab PT Goals ?Patient Stated  Goal: to get back to her baseline without AD ?PT Goal Formulation: With patient ?Time For Goal Achievement: 04/15/22 ?Potential to Achieve Goals: Good ?Progress towards PT goals: Progressing toward goals ? ?  ?Frequency ? ? ? Min 3X/week ? ? ? ?  ?PT Plan Discharge plan needs to be updated  ? ? ?Co-evaluation   ?  ?  ?  ?  ? ?  ?AM-PAC PT "6 Clicks" Mobility   ?Outcome Measure ? Help needed turning from your back to your side while in a flat bed without using bedrails?: None ?Help needed moving from lying on your back to sitting on the side of a flat bed without using bedrails?: None ?Help needed moving to and from a bed to a chair (including a wheelchair)?: A Little ?Help needed standing up from a chair using your arms (e.g., wheelchair or bedside chair)?: A Little ?Help needed to walk in hospital room?: A Lot ?Help needed climbing 3-5 steps with a railing? : A Lot ?6 Click Score: 18 ? ?  ?End of Session Equipment  Utilized During Treatment: Gait belt ?Activity Tolerance: Patient tolerated treatment well ?Patient left: in chair;with call bell/phone within reach;with chair alarm set ?  ?PT Visit Diagnosis: Unsteadiness on feet (R26.81);Muscle weakness (generalized) (M62.81);Difficulty in walking, not elsewhere classified (R26.2);Other abnormalities of gait and mobility (R26.89) ?  ? ? ?Time: 6770-3403 ?PT Time Calculation (min) (ACUTE ONLY): 37 min ? ?Charges:  $Gait Training: 8-22 mins ?$Therapeutic Exercise: 8-22 mins          ?          ? ?Moishe Spice, PT, DPT ?Acute Rehabilitation Services  ?Pager: [phone] ?Office: [phone] ? ? ? ?[name] ?04/02/2022, 4:51 PM ? ?

## 2022-04-02 NOTE — Progress Notes (Signed)
?PROGRESS NOTE ? ? ? ?[name]  NTZ:[id number] DOB: [birth year] DOA: 03/30/2022 ?PCP: [name], DO  ? ? ?Brief Narrative:  ?[name] is a 76 y.o. female with past medical history of diastolic heart failure, pulmonary hypertension, history of cryptogenic stroke, CKD stage IIIb, orthostatic hypotension, hyperlipidemia, anemia of chronic disease, history of gastric bypass, restless leg syndrome, depression and obstructive sleep apnea on CPAP presented to hospital to hospital with shortness of breath.  Patient was noted to have new bilateral pulmonary embolism and was started on IV Lasix and IV heparin.  Of note, patient was recently hospitalized 02/03/2022 for heart failure exacerbation.  Cardiac amyloidosis work-up was equivocal.  Plan was to do PYP scan in 4 to 5 months.  Patient had returned home from skilled nursing facility on 03/29/2022 when he started having increasing swelling to the lower extremities with orthopnea, PND and dyspnea on exertion..  In the ED vitals were stable.  BMP showed hypokalemia with a creatinine elevated at 1.5.  Chest x-ray showed bilateral pleural effusion, pulmonary edema and prominent right hilar region.  CTA of the chest showed segmental and subsegmental filling defects in the right upper lobe and left lower lobe.  In the ED, patient was given Lasix and oral potassium and was admitted hospital for further evaluation and treatment. ? ?Assessment and Plan: ? ?Principal Problem: ?  Acute on chronic heart failure with preserved ejection fraction (HFpEF) ([name]) ?Active Problems: ?  Bilateral pulmonary embolism ([name]) ?  Chronic kidney disease, stage 3b ([name]) ?  Orthostatic hypotension ?  History of cerebrovascular accident ?  Hypokalemia ?  OSA on CPAP ?  RLS (restless legs syndrome) ?  Dyspnea ?  Chronic pain syndrome ?  Fatigue ?  ?* Acute on chronic heart failure with preserved ejection fraction (HFpEF)/ ?Moderately severe PAH ?2D echocardiogram from 02/04/2022 with ejection  fraction of 55 to 60%.    2D echocardiogram was not impressive and cardiac MRI was equivocal for amyloidosis.  Plan is to repeat PYP scan in 4 to 5 months for progression.  Patient has received Lasix 80 mg twice daily strict intake charting, and daily weights with improvement in her symptoms today.  Cardiology on board  and patient has been initiated on spironolactone and the diuretic changed to torsemide.  Midodrine dose has been increased to 10 mg 3 times daily for orthostatic hypotension will likely benefit from sleep study as outpatient. ?  ?Bilateral pulmonary embolism ([name]) ?CTA chest showing segmental and subsegmental pulmonary embolism in the right upper and lower lobe without right heart strain.  3 Eliquis.  Concern for intermittent chest pain.  2D echocardiogram done on 03/31/2022 showed LV ejection fraction of 55 to 60% with mildly elevated pulmonary artery systolic pressure at 42 mmhg and LVH. ?  ?Chronic kidney disease, stage 3b ([name]) ?Creatinine of 1.7.  We will continue diuresis.  Check BMP in AM.   ? ?Orthostatic hypotension ?Continue midodrine 10 mg 3 times daily.  Dose has been increased due to orthostatic hypotension ?  ?History of cerebrovascular accident ?History of cryptogenic stroke without evidence for atrial fibrillation. Continue Eliquis.   Not on statins due to myalgia. ? ?Hypokalemia ?Resolved with replacement. ?  ?OSA on CPAP ?Continue CPAP nightly. ?  ?Chronic pain syndrome ?Continue Norco.  Patient complains of multiple sites pain including her back hands and legs.  Patient states that she does have gout and was on allopurinol which was stopped February.  Was briefly on colchicine few days  prior to coming to hospital.  Uric acid levels are elevated at 11.1.  Patient states that her joint pain have improved today but has been started on prednisone today.. ?  ?RLS (restless legs syndrome) ?Continue ropinirole. ? ?Debility, deconditioning,generalized weakness ?Patient was recently at the  rehabilitation facility and discharged home couple of days back.  Appears  fatigued weak and deconditioned.  Physical therapy and occupational therapy has seen the patient.  Wishes to go to CIR.  TOC on board. ?  ? ? DVT prophylaxis: Place TED hose Start: 03/31/22 1209 ?apixaban (ELIQUIS) tablet 10 mg  ?apixaban (ELIQUIS) tablet 5 mg  ? ?Code Status:   ?  Code Status: Partial Code ? ?Disposition:  ?CIR.  Patient has been using wheelchair at home for ambulation and has been having difficulty transferring.  She was recently at [facility] skilled nursing facility.   ? ?Status is: Inpatient ? ?Remains inpatient appropriate because: Need for  diuresis, cardiology following, possible rehabilitation placement. ? ? Family Communication:  ?Spoke with the patient's husband on the phone  on 03/31/2022 ? ?Consultants:  ?Cardiology ? ?Procedures:  ?None ? ?Antimicrobials:  ?None ? ?Anti-infectives (From admission, onward)  ? ? None  ? ?  ? ?Subjective: ?Today, patient was seen and examined at bedside.  Overall feels better but fatigued and weak.  Still got joint pain.  Denies overt dyspnea but had intermittent chest pain.   ? ?Objective: ?Vitals:  ? 04/01/22 2300 04/01/22 2320 04/02/22 0409 04/02/22 0757  ?BP: 127/76  (!) 96/58 (!) 91/54  ?Pulse: 90 99 (!) 102 (!) 108  ?Resp: '16 14 18 16  '$ ?Temp: 97.6 ?F (36.4 ?C)  97.7 ?F (36.5 ?C) 98 ?F (36.7 ?C)  ?TempSrc: Oral  Axillary   ?SpO2: 94% 99% 99% 91%  ?Weight:      ?Height:      ? ? ?Intake/Output Summary (Last 24 hours) at 04/02/2022 1035 ?Last data filed at 04/01/2022 1546 ?Gross per 24 hour  ?Intake 400 ml  ?Output 1000 ml  ?Net -600 ml  ? ?Filed Weights  ? 03/30/22 2330 04/01/22 0430  ?Weight: 85.3 kg 82.4 kg  ? ? ?Physical Examination: ?Body mass index is 28.44 kg/m?.  ?General:  Average built, not in obvious distress, deconditioned ?HENT:   No scleral pallor or icterus noted. Oral mucosa is moist.  ?Chest: .  Diminished breath sounds bilaterally. No crackles or wheezes.  ?CVS:  S1 &S2 heard. No murmur.  Regular rate and rhythm. ?Abdomen: Soft, nontender, nondistended.  Bowel sounds are heard.   ?Extremities: No cyanosis, clubbing with bilateral lower extremity edema.  Peripheral pulses are palpable. ?Psych: Alert, awake and oriented, normal mood ?CNS:  No cranial nerve deficits.  Power equal in all extremities.   ?Skin: Warm and dry.  No rashes noted. ? ?Data Reviewed:  ? ?CBC: ?Recent Labs  ?Lab 03/30/22 ?1734 04/01/22 ?0119 04/02/22 ?0111  ?WBC 6.0 5.8 6.9  ?HGB 10.1* 9.9* 10.0*  ?HCT 32.2* 31.5* 31.9*  ?MCV 101.6* 100.3* 99.7  ?PLT 282 246 217  ? ? ?Basic Metabolic Panel: ?Recent Labs  ?Lab 03/30/22 ?1734 03/31/22 ?0825 04/01/22 ?0119 04/02/22 ?0111  ?NA 139 138 137 137  ?K 3.2* 3.5 4.0 4.6  ?CL 103 105 99 100  ?CO2 '27 23 25 27  '$ ?GLUCOSE 118* 151* 87 93  ?BUN 41* 35* 37* 40*  ?CREATININE 1.59* 1.68* 1.66* 1.73*  ?CALCIUM 9.2 8.7* 8.9 9.1  ?MG  --  2.0 2.2 2.1  ? ? ?Liver Function Tests: ?  Recent Labs  ?Lab 03/30/22 ?2134  ?AST 17  ?ALT 15  ?ALKPHOS 89  ?BILITOT 0.8  ?PROT 6.0*  ?ALBUMIN 2.8*  ? ? ? ?Radiology Studies: ?ECHOCARDIOGRAM COMPLETE ? ?Result Date: 03/31/2022 ?   ECHOCARDIOGRAM REPORT   Patient Name:   OZELLA COMINS Vandenbrink Date of Exam: 03/31/2022 Medical Rec #:  [id number]    Height:       67.0 in Accession #:    [id number]   Weight:       188.1 lb Date of Birth:  1946-12-08     BSA:          1.970 m? Patient Age:    45 years     BP:           97/65 mmHg Patient Gender: F            HR:           104 bpm. Exam Location:  Inpatient Procedure: 2D Echo, Cardiac Doppler and Color Doppler Indications:    CHF  History:        Patient has prior history of Echocardiogram examinations, most                 recent 02/04/2022. CHF; Risk Factors:Sleep Apnea.  Sonographer:    Jefferey Pica Referring Phys: [phone] LINDSAY NICOLE FINCH IMPRESSIONS  1. Left ventricular ejection fraction, by estimation, is 55 to 60%. The left ventricle has normal function. The left ventricle has no regional wall motion  abnormalities. There is mild left ventricular hypertrophy. Indeterminate diastolic filling due to E-A fusion.  2. Right ventricular systolic function is normal. The right ventricular size is normal. There is mild

## 2022-04-02 NOTE — Progress Notes (Signed)
Inpatient Rehab Admissions Coordinator:  ? ?Per therapy recommendations,  patient was screened for CIR candidacy by Aleene Swanner, MS, CCC-SLP. At this time, Pt. Appears to be a a potential candidate for CIR. I will place   order for rehab consult per protocol for full assessment. Please contact me any with questions. ? ?Kenan Moodie, MS, CCC-SLP ?Rehab Admissions Coordinator  ?[phone] (celll) ?[phone] (office) ? ?

## 2022-04-02 NOTE — Progress Notes (Signed)
Inpatient Rehab Admissions Coordinator:  ? ?I met with Pt. To discuss returning to CIR. She states interest. I will call family to confirm that they can provide some support at discharge.  ? ?Clemens Catholic, MS, CCC-SLP ?Rehab Admissions Coordinator  ?[phone] (celll) ?[phone] (office) ? ?

## 2022-04-02 NOTE — PMR Pre-admission (Signed)
PMR Admission Coordinator Pre-Admission Assessment ? ?Patient: [name] is an 76 y.o., female ?MRN: [id number] ?DOB: Dec 11, 1946 ?Height: '5\' 7"'$  (170.2 cm) ?Weight: 80 kg ? ?Insurance Information ?HMO:     PPO:      PCP:      IPA:      80/20:    yes  OTHER:  ?HMO:     PPO:      PCP:      IPA:      80/20: ye     OTHER:  ?PRIMARY: Medicare Part A and B      Policy#: [id number]      Subscriber: Pt.  ?Phone#: Verified online on 7/41/28   Fax#:  ?Pre-Cert#:       Employer:  ?Benefits:  Phone #:      Name:  ?Eff. Date: Parts A ad B effective 07/15/2012  Deduct: $1600      Out of Pocket Max:  None      Life Max: N/A  ?CIR: 100%      SNF: 100 days ?Outpatient: 80%     Co-Pay: 20% ?Home Health: 100%      Co-Pay: none ?DME: 80%     Co-Pay: 20% ?Providers: patient's choice ?Providers: in network  ?SECONDARY: Engineer, production Supplement      Policy#: [id number]     Phone# ? ?Financial Counselor:       Phone#:  ? ?The ?Data Collection Information Summary? for patients in Inpatient Rehabilitation Facilities with attached ?Privacy Act [place] Records? was provided and verbally reviewed with: Patient ? ?Emergency Contact Information ?Contact Information   ? ? Name Relation Home Work Mobile  ? [place]  [phone]  ? ?  ? ? ?Current Medical History  ?Patient Admitting Diagnosis: CHF, debility, PE ?History of Present Illness: [name] is a 76 y.o. female with medical history significant for HFpEF (EF 55-60%, G2DD by TTE 02/04/2022), pulmonary hypertension, history of cryptogenic stroke, CKD stage IIIb, orthostatic hypotension, HLD, anemia of chronic disease, history of gastric bypass, RLS, depression, and OSA on CPAP who presented to the ED4/16/23 for evaluation of shortness of breath. ? ?Patient recently hospitalized 02/03/2022-02/15/2022 for acute on chronic HFpEF exacerbation.  Cardiac amyloidosis work-up was equivocal with cMRI however PYP scan not suggestive.  She was diuresed with IV Lasix, 7  L negative.  Plan was for repeat PYP scan in 4-5 months.  Hospitalization was complicated by AKI on CKD stage III and orthostatic hypotension.  Midodrine was added for BP support.  Patient ultimately discharged to [facility] where she stayed until she was discharged to SNF on 03/04/2022 Patient states that she returned home from SNF on 4/15.In the ED vitals were stable.  BMP showed hypokalemia with a creatinine elevated at 1.5.  Chest x-ray showed bilateral pleural effusion, pulmonary edema and prominent right hilar region.  CTA of the chest showed segmental and subsegmental filling defects in the right upper lobe and left lower lobe.  In the ED, patient was given Lasix and oral potassium and was admitted hospital for further evaluation and treatment. 2D echocardiogram from 02/04/2022 with ejection fraction of 55 to 60%. CTA chest showing segmental and subsegmental pulmonary embolism in the right upper and lower lobe without right heart strain. CIR was consulted to assist return to PLOF.  ? ?Patient's medical record from [facility] has been reviewed by the rehabilitation admission coordinator and physician. ? ?Past Medical History  ?Past Medical History:  ?Diagnosis Date  ?  Anemia   ? unable to absorb iron after gastric bypass  ? Arthritis   ? generalized  ? Asthma   ? Atrophic vaginitis   ? Back pain   ? DDD/stenosis  ? Carotid stenosis   ? Carotid US 10/16: Plaque RICA (3-79%), normal LICA  ? Depression   ? takes Cymbalta daily  ? Diverticulosis   ? benign  ? DJD (degenerative joint disease)   ? Dyslipidemia   ? Dysrhythmia   ? Family history of GI bleeding   ? GERD (gastroesophageal reflux disease)   ? takes Nexium daily  ? Gestational diabetes   ? Pt denies  ? H/O hiatal hernia   ? surgery for hernia  ? Headache(784.0)   ? takes Imitrex daily as needed and Bisoprolol daily;last migraine was about 2wks ago  ? History of bronchitis 1 yr ago  ? History of shingles   ? Insomnia   ? takes Ambien nightly  ?  Joint pain   ? Joint swelling   ? Leg cramps   ? takes Flexeril daily as needed  ? 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 Nocturia   ? Osteoporosis   ? Peripheral neuropathy   ? takes Gabapentin daily  ? Pneumonia 78yr ago  ? hx of  ? PONV (postoperative nausea and vomiting)   ? Restless leg syndrome   ? takes Requip daily  ? RLS (restless legs syndrome) 08/16/2015  ? Sleep apnea   ? uses CPAP  ? Stroke ([facility]   ? Tubular adenoma of colon   ? Vertigo   ? Vitamin D deficiency   ? ? ?Has the patient had major surgery during 100 days prior to admission? Yes ? ?Family History   ?family history includes Breast cancer in her maternal grandmother; CAD in her mother and paternal grandfather; Colon cancer in her father; Heart disease in her mother; Hypertension in her mother; Neuropathy in her mother; Osteoarthritis in her mother. ? ?Current Medications ? ?Current Facility-Administered Medications:  ?  acetaminophen (TYLENOL) tablet 650 mg, 650 mg, Oral, Q6H PRN **OR** acetaminophen (TYLENOL) suppository 650 mg, 650 mg, Rectal, Q6H PRN, PPosey Pronto Vishal R, MD ?  albuterol (PROVENTIL) (2.5 MG/3ML) 0.083% nebulizer solution 2.5 mg, 2.5 mg, Inhalation, Q6H PRN, Pokhrel, Laxman, MD ?  apixaban (ELIQUIS) tablet 10 mg, 10 mg, Oral, BID, 10 mg at 04/04/22 0845 **FOLLOWED BY** [START ON 04/07/2022] apixaban (ELIQUIS) tablet 5 mg, 5 mg, Oral, BID, Pokhrel, Laxman, MD ?  baclofen (LIORESAL) tablet 5 mg, 5 mg, Oral, TID, Pokhrel, Laxman, MD, 5 mg at 04/04/22 0845 ?  DULoxetine (CYMBALTA) DR capsule 60 mg, 60 mg, Oral, BID, PLenore Cordia MD, 60 mg at 04/04/22 0845 ?  gabapentin (NEURONTIN) capsule 100 mg, 100 mg, Oral, TID, PZada FindersR, MD, 100 mg at 04/04/22 0845 ?  HYDROcodone-acetaminophen (NORCO) 7.5-325 MG per tablet 1-2 tablet, 1-2 tablet, Oral, Q6H PRN, Pokhrel, Laxman, MD, 2 tablet at 04/04/22 0602 ?  meclizine (ANTIVERT) tablet 12.5 mg, 12.5 mg, Oral, TID, Pokhrel, Laxman, MD, 12.5 mg at 04/04/22 0845 ?  melatonin tablet 5 mg, 5  mg, Oral, QHS, Pokhrel, Laxman, MD, 5 mg at 04/03/22 2158 ?  midodrine (PROAMATINE) tablet 10 mg, 10 mg, Oral, TID, MLarey Dresser MD, 10 mg at 04/04/22 0845 ?  nitroGLYCERIN (NITROSTAT) SL tablet 0.4 mg, 0.4 mg, Sublingual, Q5 min PRN, ADeatra Canter Amjad, PA-C, 0.4 mg at 03/30/22 2222 ?  ondansetron (ZOFRAN) tablet 4 mg, 4 mg, Oral, Q6H PRN, 4 mg at  04/03/22 1652 **OR** ondansetron (ZOFRAN) injection 4 mg, 4 mg, Intravenous, Q6H PRN, Zada Finders R, MD ?  pantoprazole (PROTONIX) EC tablet 40 mg, 40 mg, Oral, Daily, Pokhrel, Laxman, MD, 40 mg at 04/04/22 0844 ?  predniSONE (DELTASONE) tablet 40 mg, 40 mg, Oral, Q breakfast, Lyda Jester M, PA-C, 40 mg at 04/04/22 0602 ?  rOPINIRole (REQUIP) tablet 1 mg, 1 mg, Oral, Daily, Zada Finders R, MD, 1 mg at 04/03/22 1652 ?  rOPINIRole (REQUIP) tablet 2 mg, 2 mg, Oral, QHS, Gardner, Jared M, DO, 2 mg at 04/03/22 2157 ?  senna-docusate (Senokot-S) tablet 1 tablet, 1 tablet, Oral, QHS PRN, Lenore Cordia, MD, 1 tablet at 04/02/22 2213 ?  torsemide (DEMADEX) tablet 20 mg, 20 mg, Oral, Daily, Larey Dresser, MD, 20 mg at 04/04/22 0844 ? ?Patients Current Diet:  ?Diet Order   ? ?       ?  Diet Heart Room service appropriate? Yes; Fluid consistency: Thin; Fluid restriction: 1500 mL Fluid  Diet effective now       ?  ? ?  ?  ? ?  ? ? ?Precautions / Restrictions ?Precautions ?Precautions: Fall, Other (comment) ?Precaution Comments: Watch BP; has TEDs and abdominal binder ?Restrictions ?Weight Bearing Restrictions: No  ? ?Has the patient had 2 or more falls or a fall with injury in the past year? Yes ? ?Prior Activity Level ?Community (5-7x/wk): Pt. was active in the community PTA ? ?Prior Functional Level ?Self Care: Did the patient need help bathing, dressing, using the toilet or eating? Independent ? ?Indoor Mobility: Did the patient need assistance with walking from room to room (with or without device)? Independent ? ?Stairs: Did the patient need assistance with internal  or external stairs (with or without device)? Independent ? ?Functional Cognition: Did the patient need help planning regular tasks such as shopping or remembering to take medications? Independent ? ?Fraser Din

## 2022-04-02 NOTE — Progress Notes (Signed)
?  Mobility Specialist Criteria Algorithm Info. ? ? ? 04/02/22 1040  ?Therapy Vitals  ?BP 104/62  ?Patient Position (if appropriate) Standing ?(Post ambulation)  ?Mobility  ?Activity Ambulated with assistance in hallway;Stood at bedside;Transferred from bed to chair ?(to chair after ambulation)  ?Range of Motion/Exercises Active;All extremities  ?Level of Assistance Standby assist, set-up cues, supervision of patient - no hands on  ?Assistive Device Front wheel walker  ?Distance Ambulated (ft) 120 ft  ?Activity Response Tolerated well  ? ?Patient received in supine. Agreed to ambulation but wanted orthostatic VS completed first. Patient tolerated positional change without a significant drop (check flowsheet 1029). Stood at bedside for approximately 10 minutes for pericare and gown change. Pt then ambulated in hallway at supervision wit slow steady gait. Returned to room without complaint or incident. Was left in recliner chair with all needs met, call bell in reach. ? ?04/02/2022 ?11:12 AM ? ?[name], CMS, BS EXP ?Acute Rehabilitation Services  ?BRAXE:[phone] ?Office: [phone] ? ?

## 2022-04-02 NOTE — Progress Notes (Addendum)
? ? Advanced Heart Failure Rounding Note ? ?PCP-Cardiologist: Sinclair Grooms, MD  ?AHF: Dr. Aundra Dubin  ? ?Subjective:   ? ?Admitted for CP and LEE.  CTA chest demonstrated segmental and subsegmental PE RUL and LLB, no evidence of right heart strain, +pulmonary edema. Started on heparin gtt and IV Lasix.  ? ?LE venous dopplers + for Lt DVT  ? ?Now on Eliquis. No further pleuritic CP. Hemodynamically stable.  ? ?Good response to IV Lasix, overall net negative 4.4L. Wt down 7 lb. No dyspnea.  ? ?SCr stable 1.7 ? ?C/w polyarticular pain. Uric acid elevated at 11.  ? ?Complains of positional dizziness. Wearing TED hoses. Not wearing abdominal binder currently.  ? ? ?Objective:   ?Weight Range: ?82.4 kg ?Body mass index is 28.44 kg/m?.  ? ?Vital Signs:   ?Temp:  [97.6 ?F (36.4 ?C)-98 ?F (36.7 ?C)] 97.7 ?F (36.5 ?C) (04/19 0409) ?Pulse Rate:  [89-105] 102 (04/19 0409) ?Resp:  [14-24] 18 (04/19 0409) ?BP: (95-127)/(56-79) 96/58 (04/19 0409) ?SpO2:  [94 %-100 %] 99 % (04/19 0409) ?  ? ?Weight change: ?Filed Weights  ? 03/30/22 2330 04/01/22 0430  ?Weight: 85.3 kg 82.4 kg  ? ? ?Intake/Output:  ? ?Intake/Output Summary (Last 24 hours) at 04/02/2022 0711 ?Last data filed at 04/01/2022 1546 ?Gross per 24 hour  ?Intake 400 ml  ?Output 1000 ml  ?Net -600 ml  ?  ? ? ?Physical Exam  ?  ?General:  Well appearing. No respiratory difficulty ?HEENT: normal ?Neck: supple. JVD not elevated. Carotids 2+ bilat; no bruits. No lymphadenopathy or thyromegaly appreciated. ?Cor: PMI nondisplaced. Regular rhythm, tachy rate. No rubs, gallops or murmurs. ?Lungs: clear ?Abdomen: soft, nontender, nondistended. No hepatosplenomegaly. No bruits or masses. Good bowel sounds. ?Extremities: no cyanosis, clubbing, rash, edema + Ted hoses  ?Neuro: alert & oriented x 3, cranial nerves grossly intact. moves all 4 extremities w/o difficulty. Affect pleasant. ? ? ?Telemetry  ? ?Sinus tach low 100s  ? ?EKG  ?  ?No new EKG to review  ? ?Labs  ?  ?CBC ?Recent  Labs  ?  04/01/22 ?0119 04/02/22 ?0111  ?WBC 5.8 6.9  ?HGB 9.9* 10.0*  ?HCT 31.5* 31.9*  ?MCV 100.3* 99.7  ?PLT 246 217  ? ?Basic Metabolic Panel ?Recent Labs  ?  04/01/22 ?0119 04/02/22 ?0111  ?NA 137 137  ?K 4.0 4.6  ?CL 99 100  ?CO2 25 27  ?GLUCOSE 87 93  ?BUN 37* 40*  ?CREATININE 1.66* 1.73*  ?CALCIUM 8.9 9.1  ?MG 2.2 2.1  ? ?Liver Function Tests ?Recent Labs  ?  03/30/22 ?2134  ?AST 17  ?ALT 15  ?ALKPHOS 89  ?BILITOT 0.8  ?PROT 6.0*  ?ALBUMIN 2.8*  ? ?No results for input(s): LIPASE, AMYLASE in the last 72 hours. ?Cardiac Enzymes ?No results for input(s): CKTOTAL, CKMB, CKMBINDEX, TROPONINI in the last 72 hours. ? ?BNP: ?BNP (last 3 results) ?Recent Labs  ?  02/03/22 ?1544 03/18/22 ?1156 03/30/22 ?1732  ?BNP 841.9* 1,209.5* 1,405.4*  ? ? ?ProBNP (last 3 results) ?No results for input(s): PROBNP in the last 8760 hours. ? ? ?D-Dimer ?No results for input(s): DDIMER in the last 72 hours. ?Hemoglobin A1C ?No results for input(s): HGBA1C in the last 72 hours. ?Fasting Lipid Panel ?No results for input(s): CHOL, HDL, LDLCALC, TRIG, CHOLHDL, LDLDIRECT in the last 72 hours. ?Thyroid Function Tests ?No results for input(s): TSH, T4TOTAL, T3FREE, THYROIDAB in the last 72 hours. ? ?Invalid input(s): FREET3 ? ?Other results: ? ? ?  Imaging  ? ? ?VAS [place] LOWER EXTREMITY VENOUS (DVT) ? ?Result Date: 04/01/2022 ? Lower Venous DVT Study Patient Name:  [name]  Date of Exam:   04/01/2022 Medical Rec #: [id number]     Accession #:    [id number] Date of Birth: Sep 21, 1946      Patient Gender: F Patient Age:   12 years Exam Location:  [facility] Procedure:      VAS [place] LOWER EXTREMITY VENOUS (DVT) Referring Phys: Ria Comment [facility] --------------------------------------------------------------------------------  Indications: Pulmonary embolism.  Comparison Study: Previous exam on 06/28/21 was negative for DVT. Performing Technologist: Rogelia Rohrer RVT, RDMS  Examination Guidelines: A complete evaluation includes B-mode imaging,  spectral Doppler, color Doppler, and power Doppler as needed of all accessible portions of each vessel. Bilateral testing is considered an integral part of a complete examination. Limited examinations for reoccurring indications may be performed as noted. The reflux portion of the exam is performed with the patient in reverse Trendelenburg.  +---------+---------------+---------+-----------+----------+-------------------+ RIGHT    CompressibilityPhasicitySpontaneityPropertiesThrombus Aging      +---------+---------------+---------+-----------+----------+-------------------+ CFV      Full           Yes      Yes                                      +---------+---------------+---------+-----------+----------+-------------------+ SFJ      Full                                                             +---------+---------------+---------+-----------+----------+-------------------+ FV Prox  Full           Yes      Yes                                      +---------+---------------+---------+-----------+----------+-------------------+ FV Mid   Full           Yes      Yes                                      +---------+---------------+---------+-----------+----------+-------------------+ FV DistalFull           Yes      Yes                                      +---------+---------------+---------+-----------+----------+-------------------+ PFV      Full                                                             +---------+---------------+---------+-----------+----------+-------------------+ POP      Full           Yes      Yes                                      +---------+---------------+---------+-----------+----------+-------------------+  PTV                                                   Not well visualized +---------+---------------+---------+-----------+----------+-------------------+ PERO                                                  Not  well visualized +---------+---------------+---------+-----------+----------+-------------------+ limited visualiztion of calf vessels due to bandages on calf. Only proximal portions visualized.  +---------+---------------+---------+-----------+----------+-------------------+ LEFT     CompressibilityPhasicitySpontaneityPropertiesThrombus Aging      +---------+---------------+---------+-----------+----------+-------------------+ CFV      Full           Yes      Yes                                      +---------+---------------+---------+-----------+----------+-------------------+ SFJ      Full                                                             +---------+---------------+---------+-----------+----------+-------------------+ FV Prox  Full           Yes      Yes                                      +---------+---------------+---------+-----------+----------+-------------------+ FV Mid   Full           Yes      Yes                                      +---------+---------------+---------+-----------+----------+-------------------+ FV DistalFull           Yes      Yes                                      +---------+---------------+---------+-----------+----------+-------------------+ PFV      Full                                                             +---------+---------------+---------+-----------+----------+-------------------+ POP      Full           Yes      Yes                                      +---------+---------------+---------+-----------+----------+-------------------+ PTV  Not seen            +---------+---------------+---------+-----------+----------+-------------------+ PERO                                                  Not well visualized +---------+---------------+---------+-----------+----------+-------------------+ Gastroc  None           No       No                    Acute               +---------+---------------+---------+-----------+----------+-------------------+   Left Technical Findings: Not visualized segments include posterior tibial veins. limited visualization of the peroneal vein due to   Summary: BILATERAL: -No evidence of popliteal cyst, bilaterally. RIGHT: - There is no evidence of deep vein thromb

## 2022-04-03 DIAGNOSIS — I2699 Other pulmonary embolism without acute cor pulmonale: Secondary | ICD-10-CM | POA: Diagnosis not present

## 2022-04-03 DIAGNOSIS — N1832 Chronic kidney disease, stage 3b: Secondary | ICD-10-CM | POA: Diagnosis not present

## 2022-04-03 DIAGNOSIS — G894 Chronic pain syndrome: Secondary | ICD-10-CM | POA: Diagnosis not present

## 2022-04-03 DIAGNOSIS — I5033 Acute on chronic diastolic (congestive) heart failure: Secondary | ICD-10-CM | POA: Diagnosis not present

## 2022-04-03 LAB — BASIC METABOLIC PANEL
Anion gap: 11 (ref 5–15)
BUN: 44 mg/dL — ABNORMAL HIGH (ref 8–23)
CO2: 27 mmol/L (ref 22–32)
Calcium: 9.2 mg/dL (ref 8.9–10.3)
Chloride: 98 mmol/L (ref 98–111)
Creatinine, Ser: 2.25 mg/dL — ABNORMAL HIGH (ref 0.44–1.00)
GFR, Estimated: 22 mL/min — ABNORMAL LOW (ref >60)
Glucose, Bld: 91 mg/dL (ref 70–99)
Potassium: 5 mmol/L (ref 3.5–5.1)
Sodium: 136 mmol/L (ref 135–145)

## 2022-04-03 MED ORDER — TORSEMIDE 20 MG PO TABS
20.0000 mg | ORAL_TABLET | Freq: Every day | ORAL | Status: DC
  Administered 2022-04-04: 20 mg via ORAL
  Filled 2022-04-03: qty 1

## 2022-04-03 NOTE — Progress Notes (Signed)
?PROGRESS NOTE ? ? ? ?[name]  VZC:[id number] DOB: 02-07-1946 DOA: 03/30/2022 ?PCP: [name], DO  ? ? ?Brief Narrative:  ?[name] is a 76 y.o. female with past medical history of diastolic heart failure, pulmonary hypertension, history of cryptogenic stroke, CKD stage IIIb, orthostatic hypotension, hyperlipidemia, anemia of chronic disease, history of gastric bypass, restless leg syndrome, depression and obstructive sleep apnea on CPAP presented to hospital to hospital with shortness of breath.  Patient was noted to have new bilateral pulmonary embolism and was started on IV Lasix and IV heparin.  Of note, patient was recently hospitalized 02/03/2022 for heart failure exacerbation.  Cardiac amyloidosis work-up was equivocal.  Plan was to do PYP scan in 4 to 5 months.  Patient had returned home from skilled nursing facility on 03/29/2022 when he started having increasing swelling to the lower extremities with orthopnea, PND and dyspnea on exertion..  In the ED vitals were stable.  BMP showed hypokalemia with a creatinine elevated at 1.5.  Chest x-ray showed bilateral pleural effusion, pulmonary edema and prominent right hilar region.  CTA of the chest showed segmental and subsegmental filling defects in the right upper lobe and left lower lobe.  In the ED, patient was given Lasix and oral potassium and was admitted hospital for further evaluation and treatment. ? ?Assessment and Plan: ? ?Principal Problem: ?  Acute on chronic heart failure with preserved ejection fraction (HFpEF) ([place]) ?Active Problems: ?  Bilateral pulmonary embolism ([place]) ?  Chronic kidney disease, stage 3b ([place]) ?  Orthostatic hypotension ?  History of cerebrovascular accident ?  Hypokalemia ?  OSA on CPAP ?  RLS (restless legs syndrome) ?  Dyspnea ?  Chronic pain syndrome ?  Fatigue ?  ?* Acute on chronic heart failure with preserved ejection fraction (HFpEF)/ ?Moderately severe PAH ?2D echocardiogram from 02/04/2022 with ejection  fraction of 55 to 60%.    2D echocardiogram was not impressive and cardiac MRI was equivocal for amyloidosis.  Plan is to repeat PYP scan in 4 to 5 months for progression.  Patient has received Lasix 80 mg twice daily strict intake charting, and daily weights with improvement in her symptoms today.  Cardiology on board  and patient has been initiated on spironolactone and the diuretic changed to torsemide.  Midodrine dose has been increased to 10 mg 3 times daily for orthostatic hypotension, will likely benefit from sleep study as outpatient.  Hold torsemide for today.  Okay from cardiology point of view for CIR placement. ?  ?Bilateral pulmonary embolism ([place]) ?CTA chest showing segmental and subsegmental pulmonary embolism in the right upper and lower lobe without right heart strain.  3 Eliquis. 2D echocardiogram done on 03/31/2022 showed LV ejection fraction of 55 to 60% with mildly elevated pulmonary artery systolic pressure at 42 mmhg and LVH. ?  ?Chronic kidney disease, stage 3b ([place]) ?Creatinine of 2.2 today from 1.7.  We will hold her torsemide today.  We will restart tomorrow if the creatinine is stable or lowering down.   Check BMP in AM.   ? ?Orthostatic hypotension ?Continue midodrine 10 mg 3 times daily.  Dose has been increased due to orthostatic hypotension.  Continue abdominal binder. ?  ?History of cerebrovascular accident ?History of cryptogenic stroke without evidence for atrial fibrillation. Continue Eliquis.   Not on statins due to myalgia. ? ?Hypokalemia ?Resolved with replacement.  Potassium of 5.0. ?  ?OSA on CPAP ?Continue CPAP nightly. ?  ?Chronic pain syndrome ?Continue Norco.  Patient states that she does have gout and was on allopurinol which was stopped February to renal function..  Was briefly on colchicine few days prior to coming to hospital.  Uric acid levels are elevated at 11.1.  Patient has been started on prednisone burst.  Improving pain over the joints. ?  ?RLS (restless legs  syndrome) ?Continue ropinirole. ? ?Monoclonal lambda light chain.  Cardiology recommends hematology follow-up after discharge.  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 ? ?Debility, deconditioning,generalized weakness ?Patient was recently at the rehabilitation facility and discharged home couple of days back.  Appears  fatigued weak and deconditioned.  Physical therapy and occupational therapy has seen the patient.  Awaiting for CIR placement. ?  ? ? DVT prophylaxis: Place TED hose Start: 03/31/22 1209 ?apixaban (ELIQUIS) tablet 10 mg  ?apixaban (ELIQUIS) tablet 5 mg  ? ?Code Status:   ?  Code Status: Partial Code ? ?Disposition:  ?CIR.  Patient has been using wheelchair at home for ambulation and has been having difficulty transferring.  She was recently at [facility] skilled nursing facility.   ? ?Status is: Inpatient ? ?Remains inpatient appropriate because: Need for  diuresis, cardiology following,  rehabilitation placement. ? ? Family Communication:  ?Spoke with the patient's husband on the phone  on 03/31/2022 ? ?Consultants:  ?Cardiology ? ?Procedures:  ?None ? ?Antimicrobials:  ?None ? ?Anti-infectives (From admission, onward)  ? ? None  ? ?  ? ?Subjective: ?Today, patient was seen and examined at bedside.  Patient states that her breathing is overall improved.  Denies any chest pain, nausea, vomiting.  Joint pain has improved including swelling as well.   ? ?Objective: ?Vitals:  ? 04/02/22 2327 04/03/22 0355 04/03/22 0551 04/03/22 0810  ?BP:  91/60  104/69  ?Pulse: (!) 104 94  100  ?Resp: '19 20  15  '$ ?Temp:  97.7 ?F (36.5 ?C)  97.9 ?F (36.6 ?C)  ?TempSrc:  Oral  Oral  ?SpO2: 96% 96%  100%  ?Weight:   81.1 kg   ?Height:      ? ? ?Intake/Output Summary (Last 24 hours) at 04/03/2022 1035 ?Last data filed at 04/03/2022 0950 ?Gross per 24 hour  ?Intake 480 ml  ?Output 725 ml  ?Net -245 ml  ? ?Filed Weights  ? 03/30/22 2330 04/01/22 0430 04/03/22 0551  ?Weight: 85.3 kg 82.4 kg 81.1 kg  ? ? ?Physical Examination: ?Body  mass index is 28 kg/m?.  ? ?General:  Average built, not in obvious distress, Communicative ?HENT:   No scleral pallor or icterus noted. Oral mucosa is moist.  ?Chest:  .  Diminished breath sounds bilaterally.  No crackles heard. ?CVS: S1 &S2 heard.  Regular rate and rhythm. ?Abdomen: Soft, nontender, nondistended.  Bowel sounds are heard.   ?Extremities: No cyanosis, clubbing with bilateral lower extremity edema.  Peripheral pulses are palpable. ?Psych: Alert, awake and oriented, normal mood ?CNS:  No cranial nerve deficits.  Power equal in all extremities.   ?Skin: Warm and dry.  No rashes noted. ? ? ?Data Reviewed:  ? ?CBC: ?Recent Labs  ?Lab 03/30/22 ?1734 04/01/22 ?0119 04/02/22 ?0111  ?WBC 6.0 5.8 6.9  ?HGB 10.1* 9.9* 10.0*  ?HCT 32.2* 31.5* 31.9*  ?MCV 101.6* 100.3* 99.7  ?PLT 282 246 217  ? ? ?Basic Metabolic Panel: ?Recent Labs  ?Lab 03/30/22 ?1734 03/31/22 ?0825 04/01/22 ?0119 04/02/22 ?0111 04/03/22 ?2094  ?NA 139 138 137 137 136  ?K 3.2* 3.5 4.0 4.6 5.0  ?CL 103 105 99 100 98  ?  CO2 '27 23 25 27 27  '$ ?GLUCOSE 118* 151* 87 93 91  ?BUN 41* 35* 37* 40* 44*  ?CREATININE 1.59* 1.68* 1.66* 1.73* 2.25*  ?CALCIUM 9.2 8.7* 8.9 9.1 9.2  ?MG  --  2.0 2.2 2.1  --   ? ? ?Liver Function Tests: ?Recent Labs  ?Lab 03/30/22 ?2134  ?AST 17  ?ALT 15  ?ALKPHOS 89  ?BILITOT 0.8  ?PROT 6.0*  ?ALBUMIN 2.8*  ? ? ? ?Radiology Studies: ?VAS [place] LOWER EXTREMITY VENOUS (DVT) ? ?Result Date: 04/01/2022 ? Lower Venous DVT Study Patient Name:  [name]  Date of Exam:   04/01/2022 Medical Rec #: [id number]     Accession #:    [id number] Date of Birth: May 30, 1946      Patient Gender: F Patient Age:   43 years Exam Location:  [facility] Procedure:      VAS [place] LOWER EXTREMITY VENOUS (DVT) Referring Phys: Ria Comment [facility] --------------------------------------------------------------------------------  Indications: Pulmonary embolism.  Comparison Study: Previous exam on 06/28/21 was negative for DVT. Performing Technologist: Rogelia Rohrer RVT,  RDMS  Examination Guidelines: A complete evaluation includes B-mode imaging, spectral Doppler, color Doppler, and power Doppler as needed of all accessible portions of each vessel. Bilateral testing is consid

## 2022-04-03 NOTE — Progress Notes (Signed)
Occupational Therapy Treatment ?Patient Details ?Name: [name] ?MRN: [id number] ?DOB: [birth year] ?Today's Date: 04/03/2022 ? ? ?History of present illness Pt is a 76 y.o. F who presents 03/30/22 with increased swelling to BLE's with orthopnea, PND, and dyspnea with exertion. CTA chest showing segmental and subsegmental pulmonary embolism in the right upper and lower lobe without right heart strain. Pt also with L leg DVT. Significant PMH: diastolic heart failure, pulmonary hypertension, history of cryptogenic stroke, CKD stage IIIb, orthostatic hypotension, hyperlipidemia, anemia of chronic disease, history of gastric bypass, restless leg syndrome, depression and obstructive sleep apnea on CPAP. ?  ?OT comments ? Pt remains eager to participate and BP improved this session (abdominal binder and TED hose still donned for session). Guided pt in dressing and toileting tasks, focusing on endurance, problem solving and standing balance. Pt able to progress to one to no UE support statically for ADLs but reliant on B UE support for mobility with one LOB noted. Continue to feel pt is a good candidate for AIR and will progress well.   ? ?Recommendations for follow up therapy are one component of a multi-disciplinary discharge planning process, led by the attending physician.  Recommendations may be updated based on patient status, additional functional criteria and insurance authorization. ?   ?Follow Up Recommendations ? Acute inpatient rehab (3hours/day)  ?  ?Assistance Recommended at Discharge Intermittent Supervision/Assistance  ?Patient can return home with the following ? A little help with walking and/or transfers;A little help with bathing/dressing/bathroom;Assistance with cooking/housework;Assist for transportation;Help with stairs or ramp for entrance ?  ?Equipment Recommendations ? None recommended by OT  ?  ?Recommendations for Other Services Rehab consult ? ?  ?Precautions / Restrictions  Precautions ?Precautions: Fall;Other (comment) ?Precaution Comments: Watch BP; has TEDs and abdominal binder ?Restrictions ?Weight Bearing Restrictions: No  ? ? ?  ? ?Mobility Bed Mobility ?  ?  ?  ?  ?  ?  ?  ?General bed mobility comments: sitting EOB on entry ?  ? ?Transfers ?Overall transfer level: Needs assistance ?Equipment used: Rolling walker (2 wheels) ?Transfers: Sit to/from Stand ?Sit to Stand: Min assist ?  ?  ?  ?  ?  ?General transfer comment: varied min guard to Min A, increased time to power up with difficulty fully acheiving upright positioning. ?  ?  ?Balance Overall balance assessment: Needs assistance ?Sitting-balance support: Feet supported ?Sitting balance-Leahy Scale: Good ?  ?  ?Standing balance support: Single extremity supported, Bilateral upper extremity supported, During functional activity ?Standing balance-Leahy Scale: Poor ?Standing balance comment: Reliant on UE support ?  ?  ?  ?  ?  ?  ?  ?  ?  ?  ?  ?   ? ?ADL either performed or assessed with clinical judgement  ? ?ADL Overall ADL's : Needs assistance/impaired ?  ?  ?  ?  ?  ?  ?  ?  ?Upper Body Dressing : Minimal assistance;Sitting ?Upper Body Dressing Details (indicate cue type and reason): assist for abdominal binder mgmt ?Lower Body Dressing: Moderate assistance;Sit to/from stand ?Lower Body Dressing Details (indicate cue type and reason): able to doff underwear and don clean underwear with Min A. With fatigue, required Mod A for pants mgmt. collab on strategies for LB dressing in regards to abdominal binder around waist ?Toilet Transfer: Minimal assistance;Ambulation;Rolling walker (2 wheels) ?Toilet Transfer Details (indicate cue type and reason): one minor LOB mobilizing to bathroom with RW. BSC over toilet ?  ?  ?  ?  ?  Functional mobility during ADLs: Minimal assistance;Rolling walker (2 wheels) ?General ADL Comments: Improving BP and endurance though rest breaks still needed with ADLs. improving balance challenges with  standing ADLs and alternating one UE support vs no UE support statically though one LOB still noted with mobility with pt at increased risk for falls ?  ? ?Extremity/Trunk Assessment Upper Extremity Assessment ?Upper Extremity Assessment: RUE deficits/detail;LUE deficits/detail ?RUE Deficits / Details: hx of shoulder replacement last May per pt. Reports some pain and is suspicious that it could be gout-related. hx of peripheral neuropathy ?LUE Deficits / Details: reports gout pain in hand. hx of peripheral neuropathy ?  ?Lower Extremity Assessment ?Lower Extremity Assessment: Defer to PT evaluation ?  ?  ?  ? ?Vision   ?Vision Assessment?: No apparent visual deficits ?  ?Perception   ?  ?Praxis   ?  ? ?Cognition Arousal/Alertness: Awake/alert ?Behavior During Therapy: [facility] for tasks assessed/performed ?Overall Cognitive Status: Within Functional Limits for tasks assessed ?  ?  ?  ?  ?  ?  ?  ?  ?  ?  ?  ?  ?  ?  ?  ?  ?General Comments: mild memory deficits, minor cues for problem solving ?  ?  ?   ?Exercises   ? ?  ?Shoulder Instructions   ? ? ?  ?General Comments BP 100/60s sitting EOB. improved to 120/70 after mobility to bathroom  ? ? ?Pertinent Vitals/ Pain       Pain Assessment ?Pain Assessment: 0-10 ?Pain Score: 5  ?Pain Location: B feet ?Pain Descriptors / Indicators: Other (Comment) (neuropathic pain) ?Pain Intervention(s): Monitored during session, Limited activity within patient's tolerance ? ?Home Living   ?  ?  ?  ?  ?  ?  ?  ?  ?  ?  ?  ?  ?  ?  ?  ?  ?  ?  ? ?  ?Prior Functioning/Environment    ?  ?  ?  ?   ? ?Frequency ? Min 2X/week  ? ? ? ? ?  ?Progress Toward Goals ? ?OT Goals(current goals can now be found in the care plan section) ? Progress towards OT goals: Progressing toward goals ? ?Acute Rehab OT Goals ?Patient Stated Goal: regain strength and independence ?OT Goal Formulation: With patient ?Time For Goal Achievement: 04/15/22 ?Potential to Achieve Goals: Good ?ADL Goals ?Pt Will Perform  Lower Body Bathing: with set-up;with adaptive equipment;sitting/lateral leans;sit to/from stand ?Pt Will Transfer to Toilet: with set-up;ambulating ?Pt/caregiver will Perform Home Exercise Program: Increased strength;Both right and left upper extremity;With theraband;Independently;With written HEP provided ?Additional ADL Goal #1: Pt to increase standing activity tolerance > 10 min during ADLs/IADLs without rest break to maximize endurance  ?Plan Discharge plan remains appropriate   ? ?Co-evaluation ? ? ?   ?  ?  ?  ?  ? ?  ?AM-PAC OT "6 Clicks" Daily Activity     ?Outcome Measure ? ? Help from another person eating meals?: None ?Help from another person taking care of personal grooming?: A Little ?Help from another person toileting, which includes using toliet, bedpan, or urinal?: A Little ?Help from another person bathing (including washing, rinsing, drying)?: A Lot ?Help from another person to put on and taking off regular upper body clothing?: A Little ?Help from another person to put on and taking off regular lower body clothing?: A Lot ?6 Click Score: 17 ? ?  ?End of Session Equipment Utilized During Treatment: Rolling walker (  2 wheels) ? ?OT Visit Diagnosis: Unsteadiness on feet (R26.81);Other abnormalities of gait and mobility (R26.89);Muscle weakness (generalized) (M62.81) ?  ?Activity Tolerance Patient tolerated treatment well ?  ?Patient Left Other (comment) (in bathroom, NT aware and pt educated to pull call light in bathroom) ?  ?Nurse Communication Mobility status (discussed with NT) ?  ? ?   ? ?Time: [phone] ?OT Time Calculation (min): 33 min ? ?Charges: OT General Charges ?$OT Visit: 1 Visit ?OT Treatments ?$Self Care/Home Management : 23-37 mins ? ?Malachy Chamber, OTR/L ?Acute Rehab Services ?Office: [phone]  ? ?[name] ?04/03/2022, 10:11 AM ?

## 2022-04-03 NOTE — Progress Notes (Signed)
Mobility Specialist Progress Note: ? ? 04/03/22 0950  ?Mobility  ?Activity Ambulated with assistance in hallway  ?Level of Assistance Contact guard assist, steadying assist  ?Assistive Device Front wheel walker  ?Distance Ambulated (ft) 120 ft  ?Activity Response Tolerated well  ?$Mobility charge 1 Mobility  ? ?Pt eager for mobility session this am. Required minG-CGA during ambulation. Asx throughout session. Pt left in bed with all needs met.  ? ?  ?Acute Rehab ?Phone: [phone] ?Office Phone: [phone] ? ?

## 2022-04-03 NOTE — Progress Notes (Signed)
VAST consulted to obtain IV access. Pt currently has no IV meds/fluids or studies ordered. Spoke with Hildred Alamin, pt's nurse; educated it is best practice not to place an IV that is not currently needed to decrease infection risk and allow for vein preservation. Further educated if pt's condition changes and IV access is needed emergently, IV team consult should be placed STAT with a comment as to why an emergent IV is needed. Hildred Alamin, pt's nurse verbalized understanding.  ?

## 2022-04-03 NOTE — Plan of Care (Signed)
?  Problem: Education: ?Goal: Ability to demonstrate management of disease process will improve ?Outcome: Progressing ?  ?Problem: Activity: ?Goal: Capacity to carry out activities will improve ?Outcome: Progressing ?  ?Problem: Cardiac: ?Goal: Ability to achieve and maintain adequate cardiopulmonary perfusion will improve ?Outcome: Progressing ?  ?Problem: Education: ?Goal: Ability to verbalize understanding of medication therapies will improve ?Outcome: Not Progressing ?  ?

## 2022-04-03 NOTE — Progress Notes (Signed)
Patient ID: [name], female   DOB: 1946/04/25, 76 y.o.   MRN: [id number] ?  ? ? Advanced Heart Failure Rounding Note ? ?PCP-Cardiologist: Sinclair Grooms, MD  ?AHF: Dr. Aundra Dubin  ? ?Subjective:   ? ?Admitted for CP and LEE.  CTA chest demonstrated segmental and subsegmental PE RUL and LLL, no evidence of right heart strain, +pulmonary edema. Started on heparin gtt and IV Lasix.  ? ?LE venous dopplers + for Lt DVT  ? ?Now on Eliquis. No chest pain and breathing is better. Hemodynamically stable.  ? ?She is back on po torsemide after initial IV Lasix, creatinine up to 2.25 today from 1.7.  ? ?C/w polyarticular pain. Uric acid elevated at 11. Now on prednisone burst.  ? ?Orthostatic symptoms improved, has compression stockings on and getting midodrine.  ? ? ?Objective:   ?Weight Range: ?81.1 kg ?Body mass index is 28 kg/m?.  ? ?Vital Signs:   ?Temp:  [97.6 ?F (36.4 ?C)-98.3 ?F (36.8 ?C)] 97.7 ?F (36.5 ?C) (04/20 0355) ?Pulse Rate:  [94-108] 94 (04/20 0355) ?Resp:  [16-20] 20 (04/20 0355) ?BP: (91-123)/(54-62) 91/60 (04/20 0355) ?SpO2:  [91 %-98 %] 96 % (04/20 0355) ?Weight:  [81.1 kg] 81.1 kg (04/20 0551) ?  ? ?Weight change: ?Filed Weights  ? 03/30/22 2330 04/01/22 0430 04/03/22 0551  ?Weight: 85.3 kg 82.4 kg 81.1 kg  ? ? ?Intake/Output:  ? ?Intake/Output Summary (Last 24 hours) at 04/03/2022 0739 ?Last data filed at 04/02/2022 2100 ?Gross per 24 hour  ?Intake 720 ml  ?Output 325 ml  ?Net 395 ml  ?  ? ? ?Physical Exam  ?  ?General: NAD ?Neck: No JVD, no thyromegaly or thyroid nodule.  ?Lungs: Clear to auscultation bilaterally with normal respiratory effort. ?CV: Nondisplaced PMI.  Heart regular S1/S2, no S3/S4, 1/6 SEM RUSB.  Trace ankle edema.    ?Abdomen: Soft, nontender, no hepatosplenomegaly, no distention.  ?Skin: Intact without lesions or rashes.  ?Neurologic: Alert and oriented x 3.  ?Psych: Normal affect. ?Extremities: No clubbing or cyanosis.  ?HEENT: Normal.  ? ? ?Telemetry  ? ?NSR 90s (personally  reviewed) ? ?EKG  ?  ?No new EKG to review  ? ?Labs  ?  ?CBC ?Recent Labs  ?  04/01/22 ?0119 04/02/22 ?0111  ?WBC 5.8 6.9  ?HGB 9.9* 10.0*  ?HCT 31.5* 31.9*  ?MCV 100.3* 99.7  ?PLT 246 217  ? ?Basic Metabolic Panel ?Recent Labs  ?  04/01/22 ?0119 04/02/22 ?0111 04/03/22 ?6948  ?NA 137 137 136  ?K 4.0 4.6 5.0  ?CL 99 100 98  ?CO2 '25 27 27  '$ ?GLUCOSE 87 93 91  ?BUN 37* 40* 44*  ?CREATININE 1.66* 1.73* 2.25*  ?CALCIUM 8.9 9.1 9.2  ?MG 2.2 2.1  --   ? ?Liver Function Tests ?No results for input(s): AST, ALT, ALKPHOS, BILITOT, PROT, ALBUMIN in the last 72 hours. ? ?No results for input(s): LIPASE, AMYLASE in the last 72 hours. ?Cardiac Enzymes ?No results for input(s): CKTOTAL, CKMB, CKMBINDEX, TROPONINI in the last 72 hours. ? ?BNP: ?BNP (last 3 results) ?Recent Labs  ?  02/03/22 ?1544 03/18/22 ?1156 03/30/22 ?1732  ?BNP 841.9* 1,209.5* 1,405.4*  ? ? ?ProBNP (last 3 results) ?No results for input(s): PROBNP in the last 8760 hours. ? ? ?D-Dimer ?No results for input(s): DDIMER in the last 72 hours. ?Hemoglobin A1C ?No results for input(s): HGBA1C in the last 72 hours. ?Fasting Lipid Panel ?No results for input(s): CHOL, HDL, LDLCALC, TRIG, CHOLHDL, LDLDIRECT in the  last 72 hours. ?Thyroid Function Tests ?No results for input(s): TSH, T4TOTAL, T3FREE, THYROIDAB in the last 72 hours. ? ?Invalid input(s): FREET3 ? ?Other results: ? ? ?Imaging  ? ? ?No results found. ? ? ?Medications:   ? ? ?Scheduled Medications: ? apixaban  10 mg Oral BID  ? Followed by  ? [START ON 04/07/2022] apixaban  5 mg Oral BID  ? baclofen  5 mg Oral TID  ? DULoxetine  60 mg Oral BID  ? gabapentin  100 mg Oral TID  ? meclizine  12.5 mg Oral TID  ? melatonin  5 mg Oral QHS  ? midodrine  10 mg Oral TID  ? pantoprazole  40 mg Oral Daily  ? predniSONE  40 mg Oral Q breakfast  ? rOPINIRole  1 mg Oral Daily  ? rOPINIRole  2 mg Oral QHS  ? [START ON 04/04/2022] torsemide  20 mg Oral Daily  ? ? ?Infusions: ? ? ?PRN Medications: ?acetaminophen **OR**  acetaminophen, albuterol, HYDROcodone-acetaminophen, nitroGLYCERIN, ondansetron **OR** ondansetron (ZOFRAN) IV, senna-docusate ? ? ? ?Patient Profile  ? ?76 y/o female w/ chronic diastolic HR w/ recent hospitalization for acute exacerbation 2/23 c/b orthostatic hypotension, now readmitted w/ segmental and subsegmental PE RUL and LLB and recurrent a/c CHF.  ? ?Echo was reviewed, EF 55-60% with mild LVH, PASP 42 mmHg, RV normal, mild-moderate AS with moderate AI.  ? ?Assessment/Plan  ? ?1. Acute on chronic diastolic CHF: Prior concern for cardiac amyloidosis with constellation of symptoms including bilateral carpal tunnel syndrome and orthostatic hypotension.  However, PYP scan was not suggestive of TTR cardiac amyloidosis.  cMRI showed normal LV and RV systolic function,  noncoronary pattern of LGE in the subepicardial basal inferior and basal inferolateral walls most suggestive of prior myocarditis but ECV 49% in septum which could be consistent with amyloidosis.  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  Echo this admission showed EF 55-60% with mild LVH, PASP 42 mmHg, RV normal, mild-moderate AS with moderate AI. She was admitted with bilateral segmental/subsegmental PEs but was also volume overloaded.  She diuresed well w/ IV Lasix, weight down.  Creatinine higher 1.7 => 2.2.  ?- Hold torsemide today, restart home 20 mg daily tomorrow as long as creatinine stable to lower.   ?- Stop spironolactone.   ?- Consider SGLT2 inhibitor in future but not very mobile and concerned for UTI risk.  ?2. CKD stage 3: History of recent AKI.  Need to be very careful with diuresis, also had recent contrast bolus with PE CT.  Creatinine up today to 2.25.  ?- Stop spironolactone.  ?- Hold torsemide today, restart 20 mg daily tomorrow as long as creatinine stable to lower.  ?3.  Orthostatic hypotension: Concern for autonomic neuropathy.  Has limited her participation with PT.  Had just arrived home from SNF  prior to admission. This seems better over the last day.  ?- Continue midodrine 10 mg tid, low threshold to increase if needed.  ?- Would resume wearing abdominal binder and compression stockings during the day.  ?4. Joint pain/Gout: Uric acid elevated 11. Treating w/ prednisone burst. ?5. PE/DVT: Bilateral segmental/subsegmental PEs.  No RV strain on echo.  Mild elevation in HS-TnI with no trend. Suspect due to minimal baseline activity. LE venous dopplers + for Lt DVT  ?- Continue apixaban.  Will need long-term.  ?6. H/o CVA: apixaban as above.  ?7. Aortic valve disorder: Mild-moderate AS, possibly moderate AI.  Murmur is not  impressive, does not have significant diastolic component.  ?8. Chest pain: Suspect due to PE.  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 2/23 Cardiolite was negative.  HS TnI mildly elevated with no trend, likely due to demand ischemia with CHF and PE.  ?9.  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 Will need full workup.  ? ?From my standpoint, can go to CIR.  Watch creatinine closely, restart torsemide when it stabilizes/trends down.  ? ?Length of Stay: 4 ? ?Loralie Champagne, MD  ?04/03/2022, 7:39 AM ? ?Advanced Heart Failure Team ?Pager [phone] (M-F; 7a - 5p)  ?Please contact [facility] for night-coverage after hours (5p -7a ) and weekends on [email or web address] ? ? ?

## 2022-04-04 ENCOUNTER — Other Ambulatory Visit: Payer: Self-pay

## 2022-04-04 ENCOUNTER — Inpatient Hospital Stay (HOSPITAL_COMMUNITY)
Admission: RE | Admit: 2022-04-04 | Discharge: 2022-04-16 | DRG: 945 | Disposition: A | Discharge status: Home Health | Payer: Medicare Other | Source: Intra-hospital | Attending: Physical Medicine and Rehabilitation | Admitting: Physical Medicine and Rehabilitation

## 2022-04-04 ENCOUNTER — Encounter (HOSPITAL_COMMUNITY): Payer: Self-pay | Admitting: Physical Medicine and Rehabilitation

## 2022-04-04 DIAGNOSIS — G2581 Restless legs syndrome: Secondary | ICD-10-CM | POA: Diagnosis present

## 2022-04-04 DIAGNOSIS — I5033 Acute on chronic diastolic (congestive) heart failure: Secondary | ICD-10-CM | POA: Diagnosis not present

## 2022-04-04 DIAGNOSIS — Z96653 Presence of artificial knee joint, bilateral: Secondary | ICD-10-CM | POA: Diagnosis present

## 2022-04-04 DIAGNOSIS — E559 Vitamin D deficiency, unspecified: Secondary | ICD-10-CM | POA: Diagnosis present

## 2022-04-04 DIAGNOSIS — R5381 Other malaise: Principal | ICD-10-CM | POA: Diagnosis present

## 2022-04-04 DIAGNOSIS — K5901 Slow transit constipation: Secondary | ICD-10-CM | POA: Diagnosis not present

## 2022-04-04 DIAGNOSIS — K219 Gastro-esophageal reflux disease without esophagitis: Secondary | ICD-10-CM | POA: Diagnosis present

## 2022-04-04 DIAGNOSIS — Z7952 Long term (current) use of systemic steroids: Secondary | ICD-10-CM

## 2022-04-04 DIAGNOSIS — Z79899 Other long term (current) drug therapy: Secondary | ICD-10-CM | POA: Diagnosis not present

## 2022-04-04 DIAGNOSIS — M1039 Gout due to renal impairment, multiple sites: Secondary | ICD-10-CM | POA: Diagnosis not present

## 2022-04-04 DIAGNOSIS — F32A Depression, unspecified: Secondary | ICD-10-CM | POA: Diagnosis present

## 2022-04-04 DIAGNOSIS — Z86718 Personal history of other venous thrombosis and embolism: Secondary | ICD-10-CM

## 2022-04-04 DIAGNOSIS — M109 Gout, unspecified: Secondary | ICD-10-CM | POA: Diagnosis present

## 2022-04-04 DIAGNOSIS — Z86711 Personal history of pulmonary embolism: Secondary | ICD-10-CM

## 2022-04-04 DIAGNOSIS — I5032 Chronic diastolic (congestive) heart failure: Secondary | ICD-10-CM | POA: Diagnosis present

## 2022-04-04 DIAGNOSIS — I959 Hypotension, unspecified: Secondary | ICD-10-CM | POA: Diagnosis present

## 2022-04-04 DIAGNOSIS — G629 Polyneuropathy, unspecified: Secondary | ICD-10-CM | POA: Diagnosis present

## 2022-04-04 DIAGNOSIS — E860 Dehydration: Secondary | ICD-10-CM | POA: Diagnosis present

## 2022-04-04 DIAGNOSIS — Z6827 Body mass index (BMI) 27.0-27.9, adult: Secondary | ICD-10-CM | POA: Diagnosis not present

## 2022-04-04 DIAGNOSIS — G4700 Insomnia, unspecified: Secondary | ICD-10-CM | POA: Diagnosis present

## 2022-04-04 DIAGNOSIS — G894 Chronic pain syndrome: Secondary | ICD-10-CM | POA: Diagnosis present

## 2022-04-04 DIAGNOSIS — N183 Chronic kidney disease, stage 3 unspecified: Secondary | ICD-10-CM | POA: Diagnosis present

## 2022-04-04 DIAGNOSIS — K5900 Constipation, unspecified: Secondary | ICD-10-CM | POA: Diagnosis present

## 2022-04-04 DIAGNOSIS — E1122 Type 2 diabetes mellitus with diabetic chronic kidney disease: Secondary | ICD-10-CM | POA: Diagnosis present

## 2022-04-04 DIAGNOSIS — N1832 Chronic kidney disease, stage 3b: Secondary | ICD-10-CM | POA: Diagnosis not present

## 2022-04-04 DIAGNOSIS — Z96611 Presence of right artificial shoulder joint: Secondary | ICD-10-CM | POA: Diagnosis present

## 2022-04-04 DIAGNOSIS — T502X5D Adverse effect of carbonic-anhydrase inhibitors, benzothiadiazides and other diuretics, subsequent encounter: Secondary | ICD-10-CM

## 2022-04-04 DIAGNOSIS — Z8673 Personal history of transient ischemic attack (TIA), and cerebral infarction without residual deficits: Secondary | ICD-10-CM

## 2022-04-04 DIAGNOSIS — Z7901 Long term (current) use of anticoagulants: Secondary | ICD-10-CM | POA: Diagnosis not present

## 2022-04-04 DIAGNOSIS — E785 Hyperlipidemia, unspecified: Secondary | ICD-10-CM | POA: Diagnosis present

## 2022-04-04 DIAGNOSIS — Z888 Allergy status to other drugs, medicaments and biological substances status: Secondary | ICD-10-CM | POA: Diagnosis not present

## 2022-04-04 DIAGNOSIS — Z8249 Family history of ischemic heart disease and other diseases of the circulatory system: Secondary | ICD-10-CM

## 2022-04-04 DIAGNOSIS — M25571 Pain in right ankle and joints of right foot: Secondary | ICD-10-CM | POA: Diagnosis not present

## 2022-04-04 DIAGNOSIS — E669 Obesity, unspecified: Secondary | ICD-10-CM | POA: Diagnosis present

## 2022-04-04 DIAGNOSIS — M25572 Pain in left ankle and joints of left foot: Secondary | ICD-10-CM | POA: Diagnosis not present

## 2022-04-04 DIAGNOSIS — G4733 Obstructive sleep apnea (adult) (pediatric): Secondary | ICD-10-CM | POA: Diagnosis present

## 2022-04-04 DIAGNOSIS — N390 Urinary tract infection, site not specified: Secondary | ICD-10-CM | POA: Diagnosis not present

## 2022-04-04 DIAGNOSIS — N1831 Chronic kidney disease, stage 3a: Secondary | ICD-10-CM | POA: Diagnosis not present

## 2022-04-04 DIAGNOSIS — R300 Dysuria: Secondary | ICD-10-CM | POA: Diagnosis not present

## 2022-04-04 DIAGNOSIS — J302 Other seasonal allergic rhinitis: Secondary | ICD-10-CM | POA: Diagnosis not present

## 2022-04-04 DIAGNOSIS — Z9884 Bariatric surgery status: Secondary | ICD-10-CM

## 2022-04-04 DIAGNOSIS — R079 Chest pain, unspecified: Secondary | ICD-10-CM | POA: Diagnosis not present

## 2022-04-04 DIAGNOSIS — I2699 Other pulmonary embolism without acute cor pulmonale: Secondary | ICD-10-CM | POA: Diagnosis not present

## 2022-04-04 LAB — CBC
HCT: 31.1 % — ABNORMAL LOW (ref 36.0–46.0)
Hemoglobin: 10.1 g/dL — ABNORMAL LOW (ref 12.0–15.0)
MCH: 32.3 pg (ref 26.0–34.0)
MCHC: 32.5 g/dL (ref 30.0–36.0)
MCV: 99.4 fL (ref 80.0–100.0)
Platelets: 252 10*3/uL (ref 150–400)
RBC: 3.13 MIL/uL — ABNORMAL LOW (ref 3.87–5.11)
RDW: 13.9 % (ref 11.5–15.5)
WBC: 5.5 10*3/uL (ref 4.0–10.5)
nRBC: 0 % (ref 0.0–0.2)

## 2022-04-04 LAB — BASIC METABOLIC PANEL
Anion gap: 8 (ref 5–15)
BUN: 44 mg/dL — ABNORMAL HIGH (ref 8–23)
CO2: 26 mmol/L (ref 22–32)
Calcium: 9.2 mg/dL (ref 8.9–10.3)
Chloride: 102 mmol/L (ref 98–111)
Creatinine, Ser: 1.91 mg/dL — ABNORMAL HIGH (ref 0.44–1.00)
GFR, Estimated: 27 mL/min — ABNORMAL LOW (ref >60)
Glucose, Bld: 100 mg/dL — ABNORMAL HIGH (ref 70–99)
Potassium: 4.6 mmol/L (ref 3.5–5.1)
Sodium: 136 mmol/L (ref 135–145)

## 2022-04-04 LAB — MAGNESIUM: Magnesium: 2.5 mg/dL — ABNORMAL HIGH (ref 1.7–2.4)

## 2022-04-04 MED ORDER — APIXABAN 5 MG PO TABS
10.0000 mg | ORAL_TABLET | Freq: Two times a day (BID) | ORAL | Status: AC
  Administered 2022-04-04 – 2022-04-07 (×6): 10 mg via ORAL
  Filled 2022-04-04 (×6): qty 2

## 2022-04-04 MED ORDER — APIXABAN 5 MG PO TABS: ORAL_TABLET | ORAL | 0 refills | Status: DC

## 2022-04-04 MED ORDER — ROPINIROLE HCL 1 MG PO TABS
2.0000 mg | ORAL_TABLET | Freq: Every day | ORAL | Status: DC
  Administered 2022-04-04 – 2022-04-15 (×12): 2 mg via ORAL
  Filled 2022-04-04 (×12): qty 2

## 2022-04-04 MED ORDER — APIXABAN 5 MG PO TABS
5.0000 mg | ORAL_TABLET | Freq: Two times a day (BID) | ORAL | Status: DC
  Administered 2022-04-07 – 2022-04-16 (×18): 5 mg via ORAL
  Filled 2022-04-04 (×18): qty 1

## 2022-04-04 MED ORDER — TORSEMIDE 20 MG PO TABS
20.0000 mg | ORAL_TABLET | Freq: Every day | ORAL | Status: DC
  Administered 2022-04-05 – 2022-04-16 (×12): 20 mg via ORAL
  Filled 2022-04-04 (×12): qty 1

## 2022-04-04 MED ORDER — PANTOPRAZOLE SODIUM 40 MG PO TBEC
40.0000 mg | DELAYED_RELEASE_TABLET | Freq: Every day | ORAL | Status: DC
  Administered 2022-04-05 – 2022-04-16 (×12): 40 mg via ORAL
  Filled 2022-04-04 (×12): qty 1

## 2022-04-04 MED ORDER — ONDANSETRON HCL 4 MG/2ML IJ SOLN: 4.0000 mg | Freq: Four times a day (QID) | INTRAMUSCULAR | Status: DC | PRN

## 2022-04-04 MED ORDER — MIDODRINE HCL 5 MG PO TABS
10.0000 mg | ORAL_TABLET | Freq: Three times a day (TID) | ORAL | Status: DC
  Administered 2022-04-04 – 2022-04-16 (×35): 10 mg via ORAL
  Filled 2022-04-04 (×35): qty 2

## 2022-04-04 MED ORDER — ACETAMINOPHEN 650 MG RE SUPP: 650.0000 mg | Freq: Four times a day (QID) | RECTAL | Status: DC | PRN

## 2022-04-04 MED ORDER — ACETAMINOPHEN 325 MG PO TABS
650.0000 mg | ORAL_TABLET | Freq: Four times a day (QID) | ORAL | Status: DC | PRN
Start: 2022-04-04 — End: 2022-04-16
  Administered 2022-04-04 – 2022-04-15 (×13): 650 mg via ORAL
  Filled 2022-04-04 (×14): qty 2

## 2022-04-04 MED ORDER — SENNOSIDES-DOCUSATE SODIUM 8.6-50 MG PO TABS: 1.0000 | ORAL_TABLET | Freq: Every evening | ORAL | Status: AC | PRN

## 2022-04-04 MED ORDER — ROPINIROLE HCL 1 MG PO TABS
1.0000 mg | ORAL_TABLET | Freq: Every day | ORAL | Status: DC
  Administered 2022-04-04 – 2022-04-15 (×11): 1 mg via ORAL
  Filled 2022-04-04 (×12): qty 1

## 2022-04-04 MED ORDER — ONDANSETRON HCL 4 MG PO TABS
4.0000 mg | ORAL_TABLET | Freq: Four times a day (QID) | ORAL | Status: DC | PRN
  Administered 2022-04-10: 4 mg via ORAL
  Filled 2022-04-04: qty 1

## 2022-04-04 MED ORDER — DULOXETINE HCL 60 MG PO CPEP
60.0000 mg | ORAL_CAPSULE | Freq: Two times a day (BID) | ORAL | Status: DC
  Administered 2022-04-04 – 2022-04-06 (×4): 60 mg via ORAL
  Filled 2022-04-04 (×4): qty 1

## 2022-04-04 MED ORDER — MELATONIN 5 MG PO TABS
5.0000 mg | ORAL_TABLET | Freq: Every day | ORAL | Status: DC
  Administered 2022-04-04 – 2022-04-15 (×12): 5 mg via ORAL
  Filled 2022-04-04 (×12): qty 1

## 2022-04-04 MED ORDER — ALBUTEROL SULFATE (2.5 MG/3ML) 0.083% IN NEBU
2.5000 mg | INHALATION_SOLUTION | Freq: Four times a day (QID) | RESPIRATORY_TRACT | Status: DC | PRN
  Administered 2022-04-12 – 2022-04-15 (×5): 2.5 mg via RESPIRATORY_TRACT
  Filled 2022-04-04 (×5): qty 3

## 2022-04-04 MED ORDER — BACLOFEN 5 MG HALF TABLET
5.0000 mg | ORAL_TABLET | Freq: Three times a day (TID) | ORAL | Status: DC
  Administered 2022-04-04 – 2022-04-06 (×7): 5 mg via ORAL
  Filled 2022-04-04 (×6): qty 1

## 2022-04-04 MED ORDER — PREDNISONE 20 MG PO TABS
40.0000 mg | ORAL_TABLET | Freq: Every day | ORAL | 0 refills | Status: DC
Start: 2022-04-05 — End: 2022-04-16

## 2022-04-04 MED ORDER — HYDROCODONE-ACETAMINOPHEN 7.5-325 MG PO TABS
1.0000 | ORAL_TABLET | Freq: Four times a day (QID) | ORAL | Status: DC | PRN
  Administered 2022-04-04 – 2022-04-06 (×6): 2 via ORAL
  Filled 2022-04-04 (×6): qty 2

## 2022-04-04 MED ORDER — TORSEMIDE 20 MG PO TABS: 20.0000 mg | ORAL_TABLET | Freq: Every day | ORAL | 2 refills | Status: DC

## 2022-04-04 MED ORDER — SENNOSIDES-DOCUSATE SODIUM 8.6-50 MG PO TABS: 1.0000 | ORAL_TABLET | Freq: Every evening | ORAL | Status: DC | PRN

## 2022-04-04 MED ORDER — GABAPENTIN 100 MG PO CAPS
100.0000 mg | ORAL_CAPSULE | Freq: Three times a day (TID) | ORAL | Status: DC
  Administered 2022-04-04 – 2022-04-16 (×36): 100 mg via ORAL
  Filled 2022-04-04 (×36): qty 1

## 2022-04-04 MED ORDER — MECLIZINE HCL 25 MG PO TABS
12.5000 mg | ORAL_TABLET | Freq: Three times a day (TID) | ORAL | Status: DC
  Administered 2022-04-04 – 2022-04-16 (×36): 12.5 mg via ORAL
  Filled 2022-04-04 (×36): qty 1

## 2022-04-04 MED ORDER — PREDNISONE 20 MG PO TABS
40.0000 mg | ORAL_TABLET | Freq: Every day | ORAL | Status: AC
  Administered 2022-04-05: 40 mg via ORAL
  Filled 2022-04-04: qty 2

## 2022-04-04 MED ORDER — NITROGLYCERIN 0.4 MG SL SUBL: 0.4000 mg | SUBLINGUAL_TABLET | SUBLINGUAL | Status: DC | PRN

## 2022-04-04 NOTE — Care Management Important Message (Signed)
Important Message ? ?Patient Details  ?Name: [name] ?MRN: [id number] ?Date of Birth: [birth year] ? ? ?Medicare Important Message Given:  Yes ? ?Patient left prior to IM delivery will mail document to the patient home address. ? ? ? ?Kasem Mozer ?04/04/2022, 2:49 PM ?

## 2022-04-04 NOTE — Discharge Summary (Addendum)
Physician Discharge Summary   Patient: [name] MRN: [id number] DOB: 1946/09/17  Admit date:     03/30/2022  Discharge date: 04/04/22  Discharge Physician: Rebekah Chesterfield Elisha Mcgruder   PCP: Irena Reichmann, DO   Recommendations at discharge:   Please continue to use abdominal binder and elastic compression stockings for orthostatic hypotension. Patient continues to have some chest discomfort due to pulmonary embolism.  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 Recommend follow-up with cardiology after discharge from acute rehab. Recommend BMP monitoring while on diuretics.  Discharge Diagnoses: Principal Problem:   Acute on chronic heart failure with preserved ejection fraction (HFpEF) (HCC) Active Problems:   Bilateral pulmonary embolism (HCC)   Chronic kidney disease, stage 3b (HCC)   Orthostatic hypotension   History of cerebrovascular accident   Hypokalemia   OSA on CPAP   RLS (restless legs syndrome)   Dyspnea   Chronic pain syndrome   Fatigue  Resolved Problems:   * No resolved hospital problems. *  Hospital Course: [name] is a 76 y.o. female with past medical history of diastolic heart failure, pulmonary hypertension, history of cryptogenic stroke, CKD stage IIIb, orthostatic hypotension, hyperlipidemia, anemia of chronic disease, history of gastric bypass, restless leg syndrome, depression and obstructive sleep apnea on CPAP presented to hospital to hospital with shortness of breath.  Patient was noted to have new bilateral pulmonary embolism and was started on IV Lasix and IV heparin.  Of note, patient was recently hospitalized 02/03/2022 for heart failure exacerbation.  Cardiac amyloidosis work-up was equivocal.  Plan was to do PYP scan in 4 to 5 months.  Patient had returned home from skilled nursing facility on 03/29/2022 when he started having increasing swelling to  the lower extremities with orthopnea, PND and dyspnea on exertion..  In the ED, vitals were stable.  BMP showed hypokalemia with a creatinine elevated at 1.5.  Chest x-ray showed bilateral pleural effusion, pulmonary edema and prominent right hilar region.  CTA of the chest showed segmental and subsegmental filling defects in the right upper lobe and left lower lobe.  In the ED, patient was given Lasix and oral potassium and was admitted hospital for further evaluation and treatment.  Assessment and Plan:  Principal Problem:   Acute on chronic heart failure with preserved ejection fraction (HFpEF) (HCC) Active Problems:   Bilateral pulmonary embolism (HCC)   Chronic kidney disease, stage 3b (HCC)   Orthostatic hypotension   History of cerebrovascular accident   Hypokalemia   OSA on CPAP   RLS (restless legs syndrome)   Dyspnea   Chronic pain syndrome   Fatigue  Acute on chronic heart failure with preserved ejection fraction (HFpEF)/ Moderately severe PAH 2D echocardiogram from 02/04/2022 with ejection fraction of 55 to 60%.    2D echocardiogram was not impressive and cardiac MRI was equivocal for amyloidosis.  Plan is to repeat PYP scan in 4 to 5 months for progression.  Patient has received Lasix 80 mg twice daily strict intake charting, and daily weights with improvement in her symptoms today.  Cardiology followed the patient during hospitalization.  Spironolactone was initiated which was discontinued at this time.  Unable to use ARB and ACE inhibitors/beta-blockers at this time.  Has orthostatic hypotension and renal insufficiency.  Midodrine dose has been increased to 10 mg 3 times daily for orthostatic hypotension, will likely benefit from sleep study as outpatient.  Low-dose torsemide has been  resumed from today.  Okay from cardiology point of view for CIR placement.   Bilateral pulmonary embolism (HCC) CTA chest showing segmental and subsegmental pulmonary embolism in the right upper and  lower lobe without right heart strain.  Continue Eliquis for anticoagulation.. 2D echocardiogram done on 03/31/2022 showed LV ejection fraction of 55 to 60% with mildly elevated pulmonary artery systolic pressure at 42 mmhg and LVH.  Continues to have some chest discomfort and pain secondary to PE.  Continue symptomatic care.   Chronic kidney disease, stage 3b (HCC) Creatinine of 1.9 today from 2.2 < 1.7.  We will need to monitor creatinine levels closely on diuretic.  Torsemide has been initiated again today.   Orthostatic hypotension Continue midodrine 10 mg 3 times daily.  Dose has been increased due to orthostatic hypotension.  Continue abdominal binder and elastic stockings..   History of cerebrovascular accident History of cryptogenic stroke without evidence for atrial fibrillation. Continue Eliquis for anticoagulation due to pulmonary embolism..   Not on statins due to myalgia.  Hypokalemia Resolved with replacement.  Potassium prior to discharge was 4.6.   OSA on CPAP Continue CPAP nightly.   Chronic pain syndrome Continue Norco.  Patient states that she does have gout and was on allopurinol which was stopped February to renal function  Was briefly on colchicine few days prior to coming to hospital.  Uric acid levels were elevated at 11.1 on this presentation..  Patient has been started on prednisone burst for 3 days.  Will complete on 04/05/2022.  Improving pain and swelling over the joints.   RLS (restless legs syndrome) Continue ropinirole.  Monoclonal lambda light chain.   Cardiology recommends hematology follow-up after discharge.  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  Debility, deconditioning,generalized weakness Patient was recently at the rehabilitation facility and discharged home . Appears  fatigued weak and deconditioned.  Physical therapy and occupational therapy has seen the patient and patient has been approved for CIR placement again.   Consultants:  Cardiology Procedures performed: None Disposition: Rehabilitation facility Diet recommendation:  Discharge Diet Orders (From admission, onward)     Start     Ordered   04/04/22 0000  Diet - low sodium heart healthy        04/04/22 1027           Cardiac diet DISCHARGE MEDICATION: Allergies as of 04/04/2022       Reactions   Atorvastatin    Other reaction(s): myalgia   Crestor [rosuvastatin]    Other reaction(s): myalgia   Ezetimibe    Other reaction(s): myalgia   Amitriptyline Rash        Medication List     STOP taking these medications    ASPIRIN 81 PO       TAKE these medications    acetaminophen 325 MG tablet Commonly known as: TYLENOL Take 2 tablets (650 mg total) by mouth every 4 (four) hours as needed for headache or mild pain.   apixaban 5 MG Tabs tablet Commonly known as: ELIQUIS Take 2 tablets (10 mg total) by mouth 2 (two) times daily for 4 days, THEN 1 tablet (5 mg total) 2 (two) times daily. Start taking on: April 04, 2022   Baclofen 5 MG Tabs Take 1 tablet by mouth 3 (three) times daily.   benzocaine 10 % mucosal gel Commonly known as: ORAJEL Use as directed in the mouth or throat 4 (four) times daily as needed for mouth pain.   calcium carbonate 1250 (500 Ca) MG  chewable tablet Commonly known as: OS-CAL Chew 1 tablet by mouth daily. Not sure of dosage   DULoxetine 60 MG capsule Commonly known as: CYMBALTA Take 60 mg by mouth 2 (two) times daily.   esomeprazole 40 MG capsule Commonly known as: NEXIUM Take 40 mg by mouth 2 (two) times daily before a meal.   flavoxATE 100 MG tablet Commonly known as: URISPAS Take 1 tablet (100 mg total) by mouth 3 (three) times daily as needed for bladder spasms (please offer to pt- cannot use pyridium due to kidney fxn).   gabapentin 100 MG capsule Commonly known as: NEURONTIN Take 1 capsule (100 mg total) by mouth 3 (three) times daily.   HYDROcodone-acetaminophen 7.5-325 MG tablet Commonly  known as: NORCO Take 1 tablet by mouth 4 (four) times daily.   meclizine 12.5 MG tablet Commonly known as: ANTIVERT Take 1 tablet (12.5 mg total) by mouth 3 (three) times daily.   melatonin 5 MG Tabs Take 1 tablet (5 mg total) by mouth at bedtime.   midodrine 10 MG tablet Commonly known as: PROAMATINE Take 1 tablet (10 mg total) by mouth 3 (three) times daily.   ondansetron 4 MG disintegrating tablet Commonly known as: ZOFRAN-ODT DISSOLVE 1 TABLET(4 MG) ON THE TONGUE EVERY 8 HOURS AS NEEDED FOR NAUSEA OR VOMITING What changed:  how much to take how to take this when to take this reasons to take this additional instructions   predniSONE 20 MG tablet Commonly known as: DELTASONE Take 2 tablets (40 mg total) by mouth daily with breakfast for 1 day. Start taking on: April 05, 2022   ProAir HFA 108 (90 Base) MCG/ACT inhaler Generic drug: albuterol Inhale 1 puff into the lungs every 6 (six) hours as needed for shortness of breath.   rOPINIRole 1 MG tablet Commonly known as: REQUIP Take 2 tablets (2 mg total) by mouth at bedtime. What changed:  how much to take when to take this additional instructions   senna 8.6 MG Tabs tablet Commonly known as: SENOKOT Take 1 tablet (8.6 mg total) by mouth daily.   senna-docusate 8.6-50 MG tablet Commonly known as: Senokot-S Take 1 tablet by mouth at bedtime as needed for mild constipation.   torsemide 20 MG tablet Commonly known as: DEMADEX Take 1 tablet (20 mg total) by mouth daily. What changed: additional instructions   triamcinolone ointment 0.1 % Commonly known as: KENALOG Apply 1 application. topically 2 (two) times daily as needed (rash, irritation).   Vitamin D (Ergocalciferol) 1.25 MG (50000 UNIT) Caps capsule Commonly known as: DRISDOL Take 1 capsule (50,000 Units total) by mouth every 7 (seven) days.       Subjective   Today, patient was seen and examined at bedside.  Complains of low blood pressure.  Has  weakness and fatigue.  Intermittent chest discomfort   Discharge Exam: Filed Weights   04/01/22 0430 04/03/22 0551 04/04/22 0417  Weight: 82.4 kg 81.1 kg 80 kg      04/04/2022    7:49 AM 04/04/2022    4:17 AM 04/04/2022   12:08 AM  Vitals with BMI  Weight  176 lbs 6 oz   BMI  27.62   Systolic 91 108 104  Diastolic 58 67 59  Pulse 102 103     General:  Average built, not in obvious distress HENT:   No scleral pallor or icterus noted. Oral mucosa is moist.  Chest:  Clear breath sounds.  Diminished breath sounds bilaterally. No crackles or wheezes.  CVS:  S1 &S2 heard. No murmur.  Regular rate and rhythm. Abdomen: Soft, nontender, nondistended.  Bowel sounds are heard.  Abdominal binder in place. Extremities: No cyanosis, clubbing bilateral lower extremity edema, elastic stockings peripheral pulses are palpable. Psych: Alert, awake and oriented, normal mood CNS:  No cranial nerve deficits.  Power equal in all extremities.   Skin: Warm and dry.  No rashes noted.  Condition at discharge: good  The results of significant diagnostics from this hospitalization (including imaging, microbiology, ancillary and laboratory) are listed below for reference.   Imaging Studies: DG Chest 2 View  Result Date: 03/30/2022 CLINICAL DATA:  chest pain EXAM: CHEST - 2 VIEW COMPARISON:  CT angiography chest 02/04/2022, chest x-ray 02/04/2022 FINDINGS: Left chest wall wireless cardiac pacemaker. The heart and mediastinal contours are unchanged. Prominent right hilar region. No focal consolidation. Chronic coarsened markings with no overt pulmonary edema. Bilateral pleural effusions. No pneumothorax. No acute osseous abnormality. Reversed total right shoulder arthroplasty. IMPRESSION: 1. Bilateral pleural effusions. 2. Possible mild pulmonary edema. 3. Prominent right hilar region. Underlying mass lesion or lymphadenopathy not excluded. Consider CT chest with intravenous contrast for further evaluation.  Electronically Signed   By: Tish Frederickson M.D.   On: 03/30/2022 19:10   CT Angio Chest Pulmonary Embolism (PE) W or WO Contrast  Result Date: 03/30/2022 CLINICAL DATA:  Pulmonary embolism suspected, high probability. EXAM: CT ANGIOGRAPHY CHEST WITH CONTRAST TECHNIQUE: Multidetector CT imaging of the chest was performed using the standard protocol during bolus administration of intravenous contrast. Multiplanar CT image reconstructions and MIPs were obtained to evaluate the vascular anatomy. RADIATION DOSE REDUCTION: This exam was performed according to the departmental dose-optimization program which includes automated exposure control, adjustment of the mA and/or kV according to patient size and/or use of iterative reconstruction technique. CONTRAST:  65mL OMNIPAQUE IOHEXOL 350 MG/ML SOLN COMPARISON:  None. FINDINGS: Cardiovascular: The heart is enlarged and there is no pericardial effusion. There is atherosclerotic calcification of the aorta without evidence of aneurysm. The pulmonary trunk is normal in caliber. Small segmental and subsegmental pulmonary artery filling defects are present in the left lower lobe and right upper lobe. There is no evidence of right heart strain. Mediastinum/Nodes: No enlarged mediastinal, hilar, or axillary lymph nodes. Thyroid gland, trachea, and esophagus demonstrate no significant findings. Lungs/Pleura: Mild ground-glass opacities are noted bilaterally and predominant at the lung bases. Atelectasis is noted bilaterally. There is a trace right pleural effusion. No pneumothorax. Upper Abdomen: No acute abnormality. Musculoskeletal: Right shoulder arthroplasty changes are noted. Degenerative changes are present in the thoracic spine. Old healed rib fractures are noted on the left. No acute osseous abnormality. Review of the MIP images confirms the above findings. IMPRESSION: 1. Segmental and subsegmental pulmonary artery filling defects in the right upper lobe and left lower  lobe. No evidence of right heart strain. 2. Mild ground-glass opacities in the lungs bilaterally, possible edema. 3. Trace right pleural effusion. 4. Aortic atherosclerosis. Critical findings were reported to Dr. Marita Kansas at 11:39 p.m. Electronically Signed   By: Thornell Sartorius M.D.   On: 03/30/2022 23:39   ECHOCARDIOGRAM COMPLETE  Result Date: 03/31/2022    ECHOCARDIOGRAM REPORT   Patient Name:   [name] Date of Exam: 03/31/2022 Medical Rec #:  [id number]    Height:       67.0 in Accession #:    [id number]   Weight:       188.1 lb Date of Birth:  [birth year]     BSA:  1.970 m Patient Age:    75 years     BP:           97/65 mmHg Patient Gender: F            HR:           104 bpm. Exam Location:  Inpatient Procedure: 2D Echo, Cardiac Doppler and Color Doppler Indications:    CHF  History:        Patient has prior history of Echocardiogram examinations, most                 recent 02/04/2022. CHF; Risk Factors:Sleep Apnea.  Sonographer:    Eduard Roux Referring Phys: [phone] LINDSAY NICOLE FINCH IMPRESSIONS  1. Left ventricular ejection fraction, by estimation, is 55 to 60%. The left ventricle has normal function. The left ventricle has no regional wall motion abnormalities. There is mild left ventricular hypertrophy. Indeterminate diastolic filling due to E-A fusion.  2. Right ventricular systolic function is normal. The right ventricular size is normal. There is mildly elevated pulmonary artery systolic pressure. The estimated right ventricular systolic pressure is 42.3 mmHg.  3. Left atrial size was moderately dilated.  4. Right atrial size was moderately dilated.  5. The mitral valve is normal in structure. Trivial mitral valve regurgitation. No evidence of mitral stenosis.  6. The aortic valve is tricuspid. Aortic valve regurgitation is moderate. Mild to moderate aortic valve stenosis. Aortic valve mean gradient measures 18.0 mmHg, AVA 1.48 cm^2.  7. The inferior vena cava is normal in size with  <50% respiratory variability, suggesting right atrial pressure of 8 mmHg. FINDINGS  Left Ventricle: Left ventricular ejection fraction, by estimation, is 55 to 60%. The left ventricle has normal function. The left ventricle has no regional wall motion abnormalities. The left ventricular internal cavity size was normal in size. There is  mild left ventricular hypertrophy. Indeterminate diastolic filling due to E-A fusion. Right Ventricle: The right ventricular size is normal. No increase in right ventricular wall thickness. Right ventricular systolic function is normal. There is mildly elevated pulmonary artery systolic pressure. The tricuspid regurgitant velocity is 2.93  m/s, and with an assumed right atrial pressure of 8 mmHg, the estimated right ventricular systolic pressure is 42.3 mmHg. Left Atrium: Left atrial size was moderately dilated. Right Atrium: Right atrial size was moderately dilated. Pericardium: There is no evidence of pericardial effusion. Mitral Valve: The mitral valve is normal in structure. Mild mitral annular calcification. Trivial mitral valve regurgitation. No evidence of mitral valve stenosis. Tricuspid Valve: The tricuspid valve is normal in structure. Tricuspid valve regurgitation is mild. Aortic Valve: The aortic valve is tricuspid. Aortic valve regurgitation is moderate. Aortic regurgitation PHT measures 291 msec. Mild to moderate aortic stenosis is present. Aortic valve mean gradient measures 18.0 mmHg. Aortic valve peak gradient measures 27.0 mmHg. Aortic valve area, by VTI measures 1.48 cm. Pulmonic Valve: The pulmonic valve was normal in structure. Pulmonic valve regurgitation is not visualized. Aorta: The aortic root is normal in size and structure. Venous: The inferior vena cava is normal in size with less than 50% respiratory variability, suggesting right atrial pressure of 8 mmHg. IAS/Shunts: No atrial level shunt detected by color flow Doppler.  LEFT VENTRICLE PLAX 2D LVIDd:          4.50 cm   Diastology LVIDs:         3.10 cm   LV e' medial:   12.00 cm/s LV PW:  1.20 cm   LV E/e' medial: 13.4 LV IVS:        1.20 cm LVOT diam:     1.90 cm LV SV:         72 LV SV Index:   36 LVOT Area:     2.84 cm  RIGHT VENTRICLE             IVC RV Basal diam:  3.30 cm     IVC diam: 1.80 cm RV Mid diam:    3.20 cm RV S prime:     13.90 cm/s TAPSE (M-mode): 1.6 cm LEFT ATRIUM             Index        RIGHT ATRIUM           Index LA diam:        4.10 cm 2.08 cm/m   RA Area:     22.60 cm LA Vol (A2C):   89.8 ml 45.58 ml/m  RA Volume:   71.90 ml  36.50 ml/m LA Vol (A4C):   87.3 ml 44.31 ml/m LA Biplane Vol: 88.5 ml 44.92 ml/m  AORTIC VALVE                    PULMONIC VALVE AV Area (Vmax):    1.64 cm     PV Vmax:       1.35 m/s AV Area (VTI):     1.48 cm     PV Peak grad:  7.3 mmHg AV Vmax:           260.00 cm/s AV VTI:            0.486 m AV Peak Grad:      27.0 mmHg AV Mean Grad:      18.0 mmHg LVOT Vmax:         150.00 cm/s LVOT Vmean:        95.000 cm/s LVOT VTI:          0.253 m LVOT/AV VTI ratio: 0.52 AI PHT:            291 msec  AORTA Ao Asc diam: 3.50 cm MITRAL VALVE                TRICUSPID VALVE MV Area (PHT): 5.79 cm     TR Peak grad:   34.3 mmHg MV Decel Time: 131 msec     TR Vmax:        293.00 cm/s MV E velocity: 161.00 cm/s                             SHUNTS                             Systemic VTI:  0.25 m                             Systemic Diam: 1.90 cm Dalton McleanMD Electronically signed by Wilfred Lacy Signature Date/Time: 03/31/2022/4:43:09 PM    Final    VAS [place] LOWER EXTREMITY VENOUS (DVT)  Result Date: 04/01/2022  Lower Venous DVT Study Patient Name:  [name]  Date of Exam:   04/01/2022 Medical Rec #: [id number]     Accession #:    [id number] Date of Birth: 16-Nov-1946      Patient Gender: F Patient Age:  75 years Exam Location:  [facility] Procedure:      VAS [place] LOWER EXTREMITY VENOUS (DVT) Referring Phys: Lillia Abed [facility]  --------------------------------------------------------------------------------  Indications: Pulmonary embolism.  Comparison Study: Previous exam on 06/28/21 was negative for DVT. Performing Technologist: Ernestene Mention RVT, RDMS  Examination Guidelines: A complete evaluation includes B-mode imaging, spectral Doppler, color Doppler, and power Doppler as needed of all accessible portions of each vessel. Bilateral testing is considered an integral part of a complete examination. Limited examinations for reoccurring indications may be performed as noted. The reflux portion of the exam is performed with the patient in reverse Trendelenburg.  +---------+---------------+---------+-----------+----------+-------------------+ RIGHT    CompressibilityPhasicitySpontaneityPropertiesThrombus Aging      +---------+---------------+---------+-----------+----------+-------------------+ CFV      Full           Yes      Yes                                      +---------+---------------+---------+-----------+----------+-------------------+ SFJ      Full                                                             +---------+---------------+---------+-----------+----------+-------------------+ FV Prox  Full           Yes      Yes                                      +---------+---------------+---------+-----------+----------+-------------------+ FV Mid   Full           Yes      Yes                                      +---------+---------------+---------+-----------+----------+-------------------+ FV DistalFull           Yes      Yes                                      +---------+---------------+---------+-----------+----------+-------------------+ PFV      Full                                                             +---------+---------------+---------+-----------+----------+-------------------+ POP      Full           Yes      Yes                                       +---------+---------------+---------+-----------+----------+-------------------+ PTV  Not well visualized +---------+---------------+---------+-----------+----------+-------------------+ PERO                                                  Not well visualized +---------+---------------+---------+-----------+----------+-------------------+ limited visualiztion of calf vessels due to bandages on calf. Only proximal portions visualized.  +---------+---------------+---------+-----------+----------+-------------------+ LEFT     CompressibilityPhasicitySpontaneityPropertiesThrombus Aging      +---------+---------------+---------+-----------+----------+-------------------+ CFV      Full           Yes      Yes                                      +---------+---------------+---------+-----------+----------+-------------------+ SFJ      Full                                                             +---------+---------------+---------+-----------+----------+-------------------+ FV Prox  Full           Yes      Yes                                      +---------+---------------+---------+-----------+----------+-------------------+ FV Mid   Full           Yes      Yes                                      +---------+---------------+---------+-----------+----------+-------------------+ FV DistalFull           Yes      Yes                                      +---------+---------------+---------+-----------+----------+-------------------+ PFV      Full                                                             +---------+---------------+---------+-----------+----------+-------------------+ POP      Full           Yes      Yes                                      +---------+---------------+---------+-----------+----------+-------------------+ PTV                                                   Not seen             +---------+---------------+---------+-----------+----------+-------------------+ PERO  Not well visualized +---------+---------------+---------+-----------+----------+-------------------+ Gastroc  None           No       No                   Acute               +---------+---------------+---------+-----------+----------+-------------------+   Left Technical Findings: Not visualized segments include posterior tibial veins. limited visualization of the peroneal vein due to   Summary: BILATERAL: -No evidence of popliteal cyst, bilaterally. RIGHT: - There is no evidence of deep vein thrombosis in the lower extremity.  LEFT: - Findings consistent with acute deep vein thrombosis involving the left gastrocnemius veins.  *See table(s) above for measurements and observations. Electronically signed by Coral Else MD on 04/01/2022 at 9:35:51 PM.    Final     Microbiology: Results for orders placed or performed during the hospital encounter of 03/30/22  MRSA Next Gen by PCR, Nasal     Status: None   Collection Time: 04/01/22  2:40 AM   Specimen: Nasal Mucosa; Nasal Swab  Result Value Ref Range Status   MRSA by PCR Next Gen NOT DETECTED NOT DETECTED Final    Comment: (NOTE) The GeneXpert MRSA Assay (FDA approved for NASAL specimens only), is one component of a comprehensive MRSA colonization surveillance program. It is not intended to diagnose MRSA infection nor to guide or monitor treatment for MRSA infections. Test performance is not FDA approved in patients less than 66 years old. Performed at [facility], [address] [place], [place] [address]     Labs: CBC: Recent Labs  Lab 03/30/22 1734 04/01/22 0119 04/02/22 0111 04/04/22 0038  WBC 6.0 5.8 6.9 5.5  HGB 10.1* 9.9* 10.0* 10.1*  HCT 32.2* 31.5* 31.9* 31.1*  MCV 101.6* 100.3* 99.7 99.4  PLT 282 246 217 252   Basic Metabolic Panel: Recent Labs  Lab  03/31/22 0825 04/01/22 0119 04/02/22 0111 04/03/22 0042 04/04/22 0038  NA 138 137 137 136 136  K 3.5 4.0 4.6 5.0 4.6  CL 105 99 100 98 102  CO2 23 25 27 27 26   GLUCOSE 151* 87 93 91 100*  BUN 35* 37* 40* 44* 44*  CREATININE 1.68* 1.66* 1.73* 2.25* 1.91*  CALCIUM 8.7* 8.9 9.1 9.2 9.2  MG 2.0 2.2 2.1  --  2.5*   Liver Function Tests: Recent Labs  Lab 03/30/22 2134  AST 17  ALT 15  ALKPHOS 89  BILITOT 0.8  PROT 6.0*  ALBUMIN 2.8*   CBG: No results for input(s): GLUCAP in the last 168 hours.  Discharge time spent: greater than 30 minutes.  Signed: Joycelyn Das, MD [facility] 04/04/2022

## 2022-04-04 NOTE — Progress Notes (Addendum)
Patient ID: [name], female   DOB: Oct 31, 1946, 75 y.o.   MRN: [id number] ?  ? ? Advanced Heart Failure Rounding Note ? ?PCP-Cardiologist: Janice Grooms, MD  ?AHF: Dr. Aundra Dubin  ? ?Subjective:   ? ?Admitted for CP and LEE.  CTA chest demonstrated segmental and subsegmental PE RUL and LLL, no evidence of right heart strain, +pulmonary edema. Started on heparin gtt and IV Lasix.  ? ?LE venous dopplers + for Lt DVT  ? ?Now on Eliquis. Stable w/o dyspnea.  ? ?AKI improving, SCr 2.3>>1.9 ? ?Gout pain improving w/ prednisone.  ? ? ?Objective:   ?Weight Range: ?80 kg ?Body mass index is 27.62 kg/m?.  ? ?Vital Signs:   ?Temp:  [97.8 ?F (36.6 ?C)-98.5 ?F (36.9 ?C)] 98.4 ?F (36.9 ?C) (04/21 0749) ?Pulse Rate:  [89-103] 102 (04/21 0749) ?Resp:  [15-18] 18 (04/21 0749) ?BP: (91-118)/(58-69) 91/58 (04/21 0749) ?SpO2:  [94 %-100 %] 96 % (04/21 0749) ?Weight:  [80 kg] 80 kg (04/21 0417) ?  ? ?Weight change: ?Filed Weights  ? 04/01/22 0430 04/03/22 0551 04/04/22 0417  ?Weight: 82.4 kg 81.1 kg 80 kg  ? ? ?Intake/Output:  ? ?Intake/Output Summary (Last 24 hours) at 04/04/2022 0801 ?Last data filed at 04/04/2022 0434 ?Gross per 24 hour  ?Intake 718 ml  ?Output 1300 ml  ?Net -582 ml  ?  ? ? ?Physical Exam  ?  ?General:  Well appearing. No respiratory difficulty ?HEENT: normal ?Neck: supple. no JVD. Carotids 2+ bilat; no bruits. No lymphadenopathy or thyromegaly appreciated. ?Cor: PMI nondisplaced. Regular rhythm mildly tachy rate. No rubs, gallops or murmurs. ?Lungs: clear ?Abdomen: soft, nontender, nondistended. No hepatosplenomegaly. No bruits or masses. Good bowel sounds. ?Extremities: no cyanosis, clubbing, rash, edema + TED  ?Neuro: alert & oriented x 3, cranial nerves grossly intact. moves all 4 extremities w/o difficulty. Affect pleasant. ? ? ?Telemetry  ? ?Sinus tach low 100s  (personally reviewed) ? ?EKG  ?  ?No new EKG to review  ? ?Labs  ?  ?CBC ?Recent Labs  ?  04/02/22 ?0111 04/04/22 ?0038  ?WBC 6.9 5.5  ?HGB 10.0* 10.1*   ?HCT 31.9* 31.1*  ?MCV 99.7 99.4  ?PLT 217 252  ? ?Basic Metabolic Panel ?Recent Labs  ?  04/02/22 ?0111 04/03/22 ?7035 04/04/22 ?0038  ?NA 137 136 136  ?K 4.6 5.0 4.6  ?CL 100 98 102  ?CO2 '27 27 26  '$ ?GLUCOSE 93 91 100*  ?BUN 40* 44* 44*  ?CREATININE 1.73* 2.25* 1.91*  ?CALCIUM 9.1 9.2 9.2  ?MG 2.1  --  2.5*  ? ?Liver Function Tests ?No results for input(s): AST, ALT, ALKPHOS, BILITOT, PROT, ALBUMIN in the last 72 hours. ? ?No results for input(s): LIPASE, AMYLASE in the last 72 hours. ?Cardiac Enzymes ?No results for input(s): CKTOTAL, CKMB, CKMBINDEX, TROPONINI in the last 72 hours. ? ?BNP: ?BNP (last 3 results) ?Recent Labs  ?  02/03/22 ?1544 03/18/22 ?1156 03/30/22 ?1732  ?BNP 841.9* 1,209.5* 1,405.4*  ? ? ?ProBNP (last 3 results) ?No results for input(s): PROBNP in the last 8760 hours. ? ? ?D-Dimer ?No results for input(s): DDIMER in the last 72 hours. ?Hemoglobin A1C ?No results for input(s): HGBA1C in the last 72 hours. ?Fasting Lipid Panel ?No results for input(s): CHOL, HDL, LDLCALC, TRIG, CHOLHDL, LDLDIRECT in the last 72 hours. ?Thyroid Function Tests ?No results for input(s): TSH, T4TOTAL, T3FREE, THYROIDAB in the last 72 hours. ? ?Invalid input(s): FREET3 ? ?Other results: ? ? ?Imaging  ? ? ?  No results found. ? ? ?Medications:   ? ? ?Scheduled Medications: ? apixaban  10 mg Oral BID  ? Followed by  ? [START ON 04/07/2022] apixaban  5 mg Oral BID  ? baclofen  5 mg Oral TID  ? DULoxetine  60 mg Oral BID  ? gabapentin  100 mg Oral TID  ? meclizine  12.5 mg Oral TID  ? melatonin  5 mg Oral QHS  ? midodrine  10 mg Oral TID  ? pantoprazole  40 mg Oral Daily  ? predniSONE  40 mg Oral Q breakfast  ? rOPINIRole  1 mg Oral Daily  ? rOPINIRole  2 mg Oral QHS  ? torsemide  20 mg Oral Daily  ? ? ?Infusions: ? ? ?PRN Medications: ?acetaminophen **OR** acetaminophen, albuterol, HYDROcodone-acetaminophen, nitroGLYCERIN, ondansetron **OR** ondansetron (ZOFRAN) IV, senna-docusate ? ? ? ?Patient Profile  ? ?76 y/o female  w/ chronic diastolic HR w/ recent hospitalization for acute exacerbation 2/23 c/b orthostatic hypotension, now readmitted w/ segmental and subsegmental PE RUL and LLB and recurrent a/c CHF.  ? ?Echo was reviewed, EF 55-60% with mild LVH, PASP 42 mmHg, RV normal, mild-moderate AS with moderate AI.  ? ?Assessment/Plan  ? ?1. Acute on chronic diastolic CHF: Prior concern for cardiac amyloidosis with constellation of symptoms including bilateral carpal tunnel syndrome and orthostatic hypotension.  However, PYP scan was not suggestive of TTR cardiac amyloidosis.  cMRI showed normal LV and RV systolic function,  noncoronary pattern of LGE in the subepicardial basal inferior and basal inferolateral walls most suggestive of prior myocarditis but ECV 49% in septum which could be consistent with amyloidosis.  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  Echo this admission showed EF 55-60% with mild LVH, PASP 42 mmHg, RV normal, mild-moderate AS with moderate AI. She was admitted with bilateral segmental/subsegmental PEs but was also volume overloaded.  She diuresed well w/ IV Lasix, weight down.  Developed AKI, SCr 1.7>>2.3. Torsemide held x 1 day, Scr improving down to 1.9 today.  ?- restart home torsemide 20 mg daily and follow BMP  ?- off spironolactone.   ?- Consider SGLT2 inhibitor in future but not very mobile and concerned for UTI risk.  ?2. AKI on CKD stage 3: due to diuresis + had recent contrast bolus with PE CT.  Creatinine bumped to 2.25 yesterday. Improved w/ diuretic hold, down to 1.91 today  ?- Now off spironolactone.  ?- will restart torsemide 20 mg daily today, follow BMP .  ?3.  Orthostatic hypotension: Concern for autonomic neuropathy.  Has limited her participation with PT.  Had just arrived home from SNF prior to admission. This seems better over the last day.  ?- Continue midodrine 10 mg tid, low threshold to increase if needed.  ?- Would resume wearing abdominal binder and  compression stockings during the day.  ?4. Joint pain/Gout: Uric acid elevated 11. Treating w/ prednisone burst. ?5. PE/DVT: Bilateral segmental/subsegmental PEs.  No RV strain on echo.  Mild elevation in HS-TnI with no trend. Suspect due to minimal baseline activity. LE venous dopplers + for Lt DVT  ?- Continue apixaban.  Will need long-term.  ?6. H/o CVA: apixaban as above.  ?7. Aortic valve disorder: Mild-moderate AS, possibly moderate AI.  Murmur is not impressive, does not have significant diastolic component.  ?8. Chest pain: Suspect due to PE.  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 2/23 Cardiolite was negative.  HS TnI mildly elevated with no trend, likely due  to demand ischemia with CHF and PE.  ?9.  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 Will need full workup.  ? ?From my standpoint, can go to CIR.  Watch creatinine closely w/ restart of torsemide.  ? ?Length of Stay: 5 ? ?Lyda Jester, PA-C  ?04/04/2022, 8:01 AM ? ?Advanced Heart Failure Team ?Pager [phone] (M-F; 7a - 5p)  ?Please contact [facility] for night-coverage after hours (5p -7a ) and weekends on [email or web address] ? ?Patient seen with PA, agree with the above note.  ? ?Stable today, denies orthostatic symptoms.  Creatinine lower at 1.9.  ? ?General: NAD ?Neck: No JVD, no thyromegaly or thyroid nodule.  ?Lungs: Clear to auscultation bilaterally with normal respiratory effort. ?CV: Nondisplaced PMI.  Heart regular S1/S2, no S3/S4, no murmur.  No peripheral edema.   ?Abdomen: Soft, nontender, no hepatosplenomegaly, no distention.  ?Skin: Intact without lesions or rashes.  ?Neurologic: Alert and oriented x 3.  ?Psych: Normal affect. ?Extremities: No clubbing or cyanosis.  ?HEENT: Normal.  ? ?Continue midodrine 10 mg tid and wear compression stockings/abdominal binder during the days.  ? ?Creatinine lower, can restart home torsemide 20 mg daily.  ? ?Continue apixaban  long-term for VTE.  ? ?Loralie Champagne ?04/04/2022 ?11:53 AM ? ?

## 2022-04-04 NOTE — TOC Transition Note (Signed)
Transition of Care (TOC) - CM/SW Discharge Note ? ? ?Patient Details  ?Name: [name] ?MRN: [id number] ?Date of Birth: 16-Jun-1946 ? ?Transition of Care (TOC) CM/SW Contact:  ?Adalene Gulotta, LCSW ?Phone Number: ?04/04/2022, 11:25 AM ? ? ?Clinical Narrative:    ?[name] to discharge and admit to CIR today. ? ? ?Final next level of care: [place] ?Barriers to Discharge: No Barriers Identified ? ? ?Patient Goals and CMS Choice ?Patient states their goals for this hospitalization and ongoing recovery are:: rehab ?CMS Medicare.gov Compare Post Acute Care list provided to:: Patient ?Choice offered to / list presented to : Patient ? ?Discharge Placement ?  ?           ?  ?  ?  ?Patient and family notified of of transfer: 04/04/22 ? ?Discharge Plan and Services ?  ?Discharge Planning Services: CM Consult ?Post Acute Care Choice: IP Rehab          ?  ?  ?  ?  ?  ?  ?[place] Agency: [place] ([place] ?  ?  ?  ? ?Social Determinants of Health (SDOH) Interventions ?  ? ? ?Readmission Risk Interventions ?   ? View : No data to display.  ?  ?  ?  ? ? ? ?[place], MSW, LCSW ?[phone] ?Heart Failure Social Worker  ? ?

## 2022-04-04 NOTE — Progress Notes (Addendum)
Inpatient Rehabilitation Admission Medication Review by a Pharmacist ? ?A complete drug regimen review was completed for this patient to identify any potential clinically significant medication issues. ? ?High Risk Drug Classes Is patient taking? Indication by Medication  ?Antipsychotic No   ?Anticoagulant Yes Apixaban - PE  ?Antibiotic No   ?Opioid Yes Norco - chronic pain syndrome  ?Antiplatelet No   ?Hypoglycemics/insulin No   ?Vasoactive Medication Yes Nitrostat - angina ?Demadex - diuresis in setting of HF ?Midodrine - orthostatic hypotension  ?Chemotherapy No   ?Other Yes Zofran, meclizine - N/V ?Baclofen - muscle spasms ?Duloxetine, Gabapentin - neuropathic pain ?Melatonin - sleep ?Protonix - GERD ?Deltasone - gout ?Requip - restless leg syndrome ?Proventil - SOB  ? ? ? ?Type of Medication Issue Identified Description of Issue Recommendation(s)  ?Drug Interaction(s) (clinically significant) ?    ?Duplicate Therapy ?    ?Allergy ?    ?No Medication Administration End Date ?    ?Incorrect Dose ?    ?Additional Drug Therapy Needed ?    ?Significant med changes from prior encounter (inform family/care partners about these prior to discharge).    ?Other ? PTA meds: ?ASA ?Calcium carbonate (TUMS) - GERD ?Nexium exchanged for protonix inpatient  ?Colchicine  ?Urispas - bladder spasms (pt. cannot use pyridium d/t kidney function) ?Antivert ?Proair HFA ?Vitamin D Restart PTA meds if and when clinically necessary during CIR admission or at time of discharge if warranted.  ? ? ?Clinically significant medication issues were identified that warrant physician communication and completion of prescribed/recommended actions by midnight of the next day:  No ? ?Time spent performing this drug regimen review (minutes):  30 ? ? ?Darden Dates Anastasov ?04/04/2022 1:03 PM ? ?Agree with the contents of this note ? ?Kenyia Wambolt BS, PharmD, BCPS ?Clinical Pharmacist ?04/04/2022 1:07 PM ? ?Contact: [phone] after 3 PM ? ?"Be curious, not  judgmental..." -Jamal Maes ?

## 2022-04-04 NOTE — Progress Notes (Signed)
Patient arrived on unit. Rehab schedule, medications and plan of care reviewed, patient states an understanding. Patient is Ax4 and has no complications noted at this time. Patient reports pain 0 out of 10. Patient educated on use of call light. [name] G [place] Warriner  

## 2022-04-04 NOTE — H&P (Signed)
? ? ?Physical Medicine and Rehabilitation Admission H&P ? ?CC: Cardiopulmonary debility ? ?HPI: [name] is a 76 year old right-handed female with history of RLS with chronic pain syndrome, depression, hyperlipidemia, asthma, cryptogenic infarction, obesity with BMI 27.62, CKD stage III, gastric bypass, OSA on CPAP, orthostasis, chronic diastolic congestive heart failure with ejection fraction of 55 to 60%.  Recent admission  02/03/2022 - 02/15/2022 for acute on chronic exacerbation of CHF as well as admission to CIR 02/15/2022 - 03/04/2022 for debility related to CHF.  She was diuresed with IV Lasix, 7 L negative.  Presented 03/30/2022 with increasing shortness of breath and swelling of lower extremities.  She did have some central chest discomfort.  Per chart review patient lives with spouse.  1 level home.  Chest x-ray showed bilateral pleural effusions.  Possible mild pulmonary edema.  CT angiogram of the chest showed segmental and subsegmental pulmonary emboli with pulmonary artery filling defect in the right upper lobe and left lower lobe.  No evidence of right heart strain.  Venous Doppler studies consistent with acute DVT involving the left gastrocnemius vein.  Admission chemistries unremarkable except potassium 3.2 BUN 41, creatinine 1.59, hemoglobin 10.1, troponin 170-159, uric acid 11.1.  Echocardiogram ejection fraction of 55 to 60% no wall motion abnormalities.  Patient was placed on heparin drip and intravenous Lasix for diuresis transition to Eliquis.  She was placed on prednisone for gout flareup.  Follow-up heart failure team chest pain felt to be related to pulmonary emboli recent Cardiolite study negative.  Elevated troponin felt to be related to demand ischemia.  Therapy evaluations completed due to patient decreased functional mobility was admitted for a comprehensive rehab program. Denies shortness of breath currently, but experiences with exertion. ? ?Review of Systems  ?Constitutional:   Negative for chills and fever.  ?HENT:  Negative for hearing loss.   ?Eyes:  Negative for blurred vision and double vision.  ?Respiratory:  Positive for shortness of breath. Negative for cough and wheezing.   ?Cardiovascular:  Positive for chest pain and leg swelling.  ?Gastrointestinal:  Positive for constipation. Negative for heartburn, nausea and vomiting.  ?     GERD  ?Genitourinary:  Negative for dysuria, flank pain and hematuria.  ?Musculoskeletal:  Positive for joint pain and myalgias.  ?Skin:  Negative for rash.  ?Neurological:  Positive for dizziness and headaches.  ?Psychiatric/Behavioral:  Positive for depression.   ?All other systems reviewed and are negative. ?Past Medical History:  ?Diagnosis Date  ? Anemia   ? unable to absorb iron after gastric bypass  ? Arthritis   ? generalized  ? Asthma   ? Atrophic vaginitis   ? Back pain   ? DDD/stenosis  ? Carotid stenosis   ? Carotid US 10/16: Plaque RICA (9-03%), normal LICA  ? Depression   ? takes Cymbalta daily  ? Diverticulosis   ? benign  ? DJD (degenerative joint disease)   ? Dyslipidemia   ? Dysrhythmia   ? Family history of GI bleeding   ? GERD (gastroesophageal reflux disease)   ? takes Nexium daily  ? Gestational diabetes   ? Pt denies  ? H/O hiatal hernia   ? surgery for hernia  ? Headache(784.0)   ? takes Imitrex daily as needed and Bisoprolol daily;last migraine was about 2wks ago  ? History of bronchitis 1 yr ago  ? History of shingles   ? Insomnia   ? takes Ambien nightly  ? Joint pain   ? Joint swelling   ?  Leg cramps   ? takes Flexeril daily as needed  ? 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 Nocturia   ? Osteoporosis   ? Peripheral neuropathy   ? takes Gabapentin daily  ? Pneumonia 85yr ago  ? hx of  ? PONV (postoperative nausea and vomiting)   ? Restless leg syndrome   ? takes Requip daily  ? RLS (restless legs syndrome) 08/16/2015  ? Sleep apnea   ? uses CPAP  ? Stroke ([facility]   ? Tubular adenoma of colon   ? Vertigo   ? Vitamin D deficiency    ? ?Past Surgical History:  ?Procedure Laterality Date  ? COLONOSCOPY    ? EP IMPLANTABLE DEVICE N/A 11/19/2016  ? Procedure: Loop Recorder Insertion;  Surgeon: SDeboraha Sprang MD;  [facility];  Service: Cardiovascular;  Laterality: N/A;  ? ESOPHAGOGASTRODUODENOSCOPY    ? excess skin removal    ? post weight loss  ? GASTRIC BYPASS  2001  ? HERNIA REPAIR    ? umbilical  ? JOINT REPLACEMENT Right   ? knee  ? REVERSE SHOULDER ARTHROPLASTY Right 04/25/2021  ? Procedure: REVERSE SHOULDER ARTHROPLASTY carpal tunel realase right wrist;  Surgeon: CTania Ade MD;  Location: [facility];  Service: Orthopedics;  Laterality: Right;  ? RIGHT HEART CATH N/A 02/06/2022  ? Procedure: RIGHT HEART CATH;  Surgeon: SBelva Crome MD;  [facility];  Service: Cardiovascular;  Laterality: N/A;  ? skin reduction  2003 2004  ? stomach and arm  ? STEROID INJECTION TO SCAR    ? x 2 in back   ? TOTAL KNEE ARTHROPLASTY Left 04/03/2015  ? Procedure: LEFT TOTAL KNEE ARTHROPLASTY;  Surgeon: PMelrose Nakayama MD;  [facility]  Service: Orthopedics;  Laterality: Left;  ? WISDOM TOOTH EXTRACTION    ? ?Family History  ?Problem Relation Age of Onset  ? CAD Mother   ? Hypertension Mother   ? Neuropathy Mother   ? Osteoarthritis Mother   ? Heart disease Mother   ? Breast cancer Maternal Grandmother   ? CAD Paternal Grandfather   ? Colon cancer Father   ? ?Social History:  reports that she has never smoked. She has never used smokeless tobacco. She reports current alcohol use. She reports that she does not use drugs. ?Allergies:  ?Allergies  ?Allergen Reactions  ? Atorvastatin   ?  Other reaction(s): myalgia  ? Crestor [Rosuvastatin]   ?  Other reaction(s): myalgia  ? Ezetimibe   ?  Other reaction(s): myalgia  ? Amitriptyline Rash  ? ?Medications Prior to Admission  ?Medication Sig Dispense Refill  ? acetaminophen (TYLENOL) 325 MG tablet Take 2 tablets (650 mg total) by mouth every 4 (four) hours as needed for headache  or mild pain.    ? apixaban (ELIQUIS) 5 MG TABS tablet Take 2 tablets (10 mg total) by mouth 2 (two) times daily for 4 days, THEN 1 tablet (5 mg total) 2 (two) times daily. 196 tablet 0  ? Baclofen 5 MG TABS Take 1 tablet by mouth 3 (three) times daily.    ? benzocaine (ORAJEL) 10 % mucosal gel Use as directed in the mouth or throat 4 (four) times daily as needed for mouth pain. 5.3 g 0  ? calcium carbonate (OS-CAL) 1250 (500 CA) MG chewable tablet Chew 1 tablet by mouth daily. Not sure of dosage    ? DULoxetine (CYMBALTA) 60 MG capsule Take 60 mg by mouth 2 (two) times daily.  11  ? esomeprazole (NEXIUM) 40 MG capsule Take 40 mg by mouth 2 (two) times daily before a meal.   6  ? flavoxATE (URISPAS) 100 MG tablet Take 1 tablet (100 mg total) by mouth 3 (three) times daily as needed for bladder spasms (please offer to pt- cannot use pyridium due to kidney fxn). 30 tablet 0  ? gabapentin (NEURONTIN) 100 MG capsule Take 1 capsule (100 mg total) by mouth 3 (three) times daily. 15 capsule 0  ? HYDROcodone-acetaminophen (NORCO) 7.5-325 MG tablet Take 1 tablet by mouth 4 (four) times daily. 12 tablet 0  ? meclizine (ANTIVERT) 12.5 MG tablet Take 1 tablet (12.5 mg total) by mouth 3 (three) times daily. 30 tablet 0  ? melatonin 5 MG TABS Take 1 tablet (5 mg total) by mouth at bedtime.  0  ? midodrine (PROAMATINE) 10 MG tablet Take 1 tablet (10 mg total) by mouth 3 (three) times daily. 90 tablet 2  ? ondansetron (ZOFRAN-ODT) 4 MG disintegrating tablet DISSOLVE 1 TABLET(4 MG) ON THE TONGUE EVERY 8 HOURS AS NEEDED FOR NAUSEA OR VOMITING (Patient taking differently: Take 4 mg by mouth every 8 (eight) hours as needed for nausea.) 45 tablet 2  ? [START ON 04/05/2022] predniSONE (DELTASONE) 20 MG tablet Take 2 tablets (40 mg total) by mouth daily with breakfast for 1 day. 2 tablet 0  ? PROAIR HFA 108 (90 Base) MCG/ACT inhaler Inhale 1 puff into the lungs every 6 (six) hours as needed for shortness of breath.  5  ? rOPINIRole  (REQUIP) 1 MG tablet Take 2 tablets (2 mg total) by mouth at bedtime. (Patient taking differently: Take 1-2 mg by mouth See admin instructions. 1 tab at 4 pm ?2 tabs at bedtime)    ? senna (SENOKOT) 8.6 MG TABS tablet

## 2022-04-04 NOTE — Progress Notes (Signed)
Inpatient Rehab Admissions Coordinator:  ?There is a bed available for pt to admit to CIR today. Dr. Louanne Belton is aware and in agreement. Pt, NSG, and TOC are aware. ? ? ?Gayland Curry, MS, CCC-SLP ?Admissions Coordinator ?[phone] ? ?

## 2022-04-04 NOTE — Plan of Care (Signed)
?  Problem: Activity: ?Goal: Capacity to carry out activities will improve ?Outcome: Progressing ?  ?Problem: Cardiac: ?Goal: Ability to achieve and maintain adequate cardiopulmonary perfusion will improve ?Outcome: Progressing ?  ?Problem: Clinical Measurements: ?Goal: Will remain free from infection ?Outcome: Progressing ?Goal: Respiratory complications will improve ?Outcome: Progressing ?Goal: Cardiovascular complication will be avoided ?Outcome: Progressing ?  ?Problem: Activity: ?Goal: Risk for activity intolerance will decrease ?Outcome: Progressing ?  ?

## 2022-04-04 NOTE — Discharge Instructions (Addendum)
Inpatient Rehab Discharge Instructions ? ?[name] ?Discharge date and time: No discharge date for patient encounter.  ? ?Activities/Precautions/ Functional Status: ?Activity: activity as tolerated ?Diet: regular diet ?Wound Care: Routine skin checks ?Functional status:  ?___ No restrictions     ___ Walk up steps independently ?___ 24/7 supervision/assistance   ___ Walk up steps with assistance ?___ Intermittent supervision/assistance  ___ Bathe/dress independently ?___ Walk with walker     _x__ Bathe/dress with assistance ?___ Walk Independently    ___ Shower independently ?___ Walk with assistance    ___ Shower with assistance ?___ No alcohol     ___ Return to work/school ________ ? ? ?COMMUNITY REFERRALS UPON DISCHARGE:   ? ?Home Health:   PT     OT       SNA       ?               Agency: [facility] [employer]  Phone: [phone] ?*Please expect follow-up within 2-3 days of discharge to schedule your home visit. If you have not received follow-up, be sure to contact the branch directly* ? ? ?Medical Equipment/Items Ordered:rolling walker and nebulizer ?                                                Agency/Supplier: [facility] [phone] ? ? ? ?Special Instructions: ?No driving smoking or alcohol ? ? ?My questions have been answered and I understand these instructions. I will adhere to these goals and the provided educational materials after my discharge from the hospital. ? ?Patient/Caregiver Signature _______________________________ Date __________ ? ?Clinician Signature _______________________________________ Date __________ ? ?Please bring this form and your medication list with you to all your follow-up doctor's appointments.   ? ?___________________________________________________________________________________________________ ? ?Information on my medicine - ELIQUIS? (apixaban) ? ?This medication education was reviewed with me or my healthcare representative as part of my discharge  preparation.  ? ?Why was Eliquis? prescribed for you? ?Eliquis? was prescribed to treat blood clots that may have been found in the veins of your legs (deep vein thrombosis) or in your lungs (pulmonary embolism) and to reduce the risk of them occurring again. ? ?What do You need to know about Eliquis? ? ?The dose is ONE 5 mg tablet taken TWICE daily.  Eliquis? may be taken with or without food.  ? ?Try to take the dose about the same time in the morning and in the evening. If you have difficulty swallowing the tablet whole please discuss with your pharmacist how to take the medication safely. ? ?Take Eliquis? exactly as prescribed and DO NOT stop taking Eliquis? without talking to the doctor who prescribed the medication. Stopping may increase your risk of developing a new blood clot.  Refill your prescription before you run out. ? ?After discharge, you should have regular check-up appointments with your healthcare provider that is prescribing your Eliquis?. ?   ?What do you do if you miss a dose? ?If a dose of ELIQUIS? is not taken at the scheduled time, take it as soon as possible on the same day and twice-daily administration should be resumed. The dose should not be doubled to make up for a missed dose. ? ?Important Safety Information ?A possible side effect of Eliquis? is bleeding. You should call your healthcare provider right away if you experience any of the following: ?  Bleeding from an injury or your nose that does not stop. ?Unusual colored urine (red or dark brown) or unusual colored stools (red or black). ?Unusual bruising for unknown reasons. ?A serious fall or if you hit your head (even if there is no bleeding). ? ?Some medicines may interact with Eliquis? and might increase your risk of bleeding or clotting while on Eliquis?Marland Kitchen To help avoid this, consult your healthcare provider or pharmacist prior to using any new prescription or non-prescription medications, including herbals, vitamins, non-steroidal  anti-inflammatory drugs (NSAIDs) and supplements. ? ?This website has more information on Eliquis? (apixaban): http://www.eliquis.com/eliquis/home ? ?

## 2022-04-04 NOTE — H&P (Incomplete)
? ? ?Physical Medicine and Rehabilitation Admission H&P ? ?  ?Chief Complaint  ?Patient presents with  ? Chest Pain  ?: ?HPI: [name] is a 76 year old right-handed female with history of RLS with chronic pain syndrome, depression, hyperlipidemia, asthma, cryptogenic infarction, obesity with BMI 27.62, CKD stage III, gastric bypass, OSA on CPAP, orthostasis, chronic diastolic congestive heart failure with ejection fraction of 55 to 60%.  Recent admission  02/03/2022 - 02/15/2022 for acute on chronic exacerbation of CHF as well as admission to CIR 02/15/2022 - 03/04/2022 for debility related to CHF.  She was diuresed with IV Lasix, 7 L negative.  Presented 03/30/2022 with increasing shortness of breath and swelling of lower extremities.  She did have some central chest discomfort.  Per chart review patient lives with spouse.  1 level home.  Chest x-ray showed bilateral pleural effusions.  Possible mild pulmonary edema.  CT angiogram of the chest showed segmental and subsegmental pulmonary emboli with pulmonary artery filling defect in the right upper lobe and left lower lobe.  No evidence of right heart strain.  Venous Doppler studies consistent with acute DVT involving the left gastrocnemius vein.  Admission chemistries unremarkable except potassium 3.2 BUN 41, creatinine 1.59, hemoglobin 10.1, troponin 170-159, uric acid 11.1.  Echocardiogram ejection fraction of 55 to 60% no wall motion abnormalities.  Patient was placed on heparin drip and intravenous Lasix for diuresis transition to Eliquis.  She was placed on prednisone for gout flareup.  Follow-up heart failure team chest pain felt to be related to pulmonary emboli recent Cardiolite study negative.  Elevated troponin felt to be related to demand ischemia.  Therapy evaluations completed due to patient decreased functional mobility was admitted for a comprehensive rehab program. ? ?Review of Systems  ?Constitutional:  Negative for chills and fever.  ?HENT:   Negative for hearing loss.   ?Eyes:  Negative for blurred vision and double vision.  ?Respiratory:  Positive for shortness of breath. Negative for cough and wheezing.   ?Cardiovascular:  Positive for chest pain and leg swelling.  ?Gastrointestinal:  Positive for constipation. Negative for heartburn, nausea and vomiting.  ?     GERD  ?Genitourinary:  Negative for dysuria, flank pain and hematuria.  ?Musculoskeletal:  Positive for joint pain and myalgias.  ?Skin:  Negative for rash.  ?Neurological:  Positive for dizziness and headaches.  ?Psychiatric/Behavioral:  Positive for depression.   ?All other systems reviewed and are negative. ?Past Medical History:  ?Diagnosis Date  ? Anemia   ? unable to absorb iron after gastric bypass  ? Arthritis   ? generalized  ? Asthma   ? Atrophic vaginitis   ? Back pain   ? DDD/stenosis  ? Carotid stenosis   ? Carotid US 10/16: Plaque RICA (5-62%), normal LICA  ? Depression   ? takes Cymbalta daily  ? Diverticulosis   ? benign  ? DJD (degenerative joint disease)   ? Dyslipidemia   ? Dysrhythmia   ? Family history of GI bleeding   ? GERD (gastroesophageal reflux disease)   ? takes Nexium daily  ? Gestational diabetes   ? Pt denies  ? H/O hiatal hernia   ? surgery for hernia  ? Headache(784.0)   ? takes Imitrex daily as needed and Bisoprolol daily;last migraine was about 2wks ago  ? History of bronchitis 1 yr ago  ? History of shingles   ? Insomnia   ? takes Ambien nightly  ? Joint pain   ? Joint swelling   ?  Leg cramps   ? takes Flexeril daily as needed  ? 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 Nocturia   ? Osteoporosis   ? Peripheral neuropathy   ? takes Gabapentin daily  ? Pneumonia 63yr ago  ? hx of  ? PONV (postoperative nausea and vomiting)   ? Restless leg syndrome   ? takes Requip daily  ? RLS (restless legs syndrome) 08/16/2015  ? Sleep apnea   ? uses CPAP  ? Stroke ([facility]   ? Tubular adenoma of colon   ? Vertigo   ? Vitamin D deficiency   ? ?Past Surgical History:  ?Procedure  Laterality Date  ? COLONOSCOPY    ? EP IMPLANTABLE DEVICE N/A 11/19/2016  ? Procedure: Loop Recorder Insertion;  Surgeon: SDeboraha Sprang MD;  [facility];  Service: Cardiovascular;  Laterality: N/A;  ? ESOPHAGOGASTRODUODENOSCOPY    ? excess skin removal    ? post weight loss  ? GASTRIC BYPASS  2001  ? HERNIA REPAIR    ? umbilical  ? JOINT REPLACEMENT Right   ? knee  ? REVERSE SHOULDER ARTHROPLASTY Right 04/25/2021  ? Procedure: REVERSE SHOULDER ARTHROPLASTY carpal tunel realase right wrist;  Surgeon: CTania Ade MD;  Location: [facility];  Service: Orthopedics;  Laterality: Right;  ? RIGHT HEART CATH N/A 02/06/2022  ? Procedure: RIGHT HEART CATH;  Surgeon: SBelva Crome MD;  [facility];  Service: Cardiovascular;  Laterality: N/A;  ? skin reduction  2003 2004  ? stomach and arm  ? STEROID INJECTION TO SCAR    ? x 2 in back   ? TOTAL KNEE ARTHROPLASTY Left 04/03/2015  ? Procedure: LEFT TOTAL KNEE ARTHROPLASTY;  Surgeon: PMelrose Nakayama MD;  [facility]  Service: Orthopedics;  Laterality: Left;  ? WISDOM TOOTH EXTRACTION    ? ?Family History  ?Problem Relation Age of Onset  ? CAD Mother   ? Hypertension Mother   ? Neuropathy Mother   ? Osteoarthritis Mother   ? Heart disease Mother   ? Breast cancer Maternal Grandmother   ? CAD Paternal Grandfather   ? Colon cancer Father   ? ?Social History:  reports that she has never smoked. She has never used smokeless tobacco. She reports current alcohol use. She reports that she does not use drugs. ?Allergies:  ?Allergies  ?Allergen Reactions  ? Atorvastatin   ?  Other reaction(s): myalgia  ? Crestor [Rosuvastatin]   ?  Other reaction(s): myalgia  ? Ezetimibe   ?  Other reaction(s): myalgia  ? Amitriptyline Rash  ? ?Medications Prior to Admission  ?Medication Sig Dispense Refill  ? acetaminophen (TYLENOL) 325 MG tablet Take 2 tablets (650 mg total) by mouth every 4 (four) hours as needed for headache or mild pain.    ? ASPIRIN 81 PO Take  81 mg by mouth at bedtime.    ? Baclofen 5 MG TABS Take 1 tablet by mouth 3 (three) times daily.    ? benzocaine (ORAJEL) 10 % mucosal gel Use as directed in the mouth or throat 4 (four) times daily as needed for mouth pain. 5.3 g 0  ? calcium carbonate (OS-CAL) 1250 (500 CA) MG chewable tablet Chew 1 tablet by mouth daily. Not sure of dosage    ? DULoxetine (CYMBALTA) 60 MG capsule Take 60 mg by mouth 2 (two) times daily.  11  ? esomeprazole (NEXIUM) 40 MG capsule Take 40 mg by mouth 2 (two) times daily before a meal.  6  ? flavoxATE (URISPAS) 100 MG tablet Take 1 tablet (100 mg total) by mouth 3 (three) times daily as needed for bladder spasms (please offer to pt- cannot use pyridium due to kidney fxn). 30 tablet 0  ? gabapentin (NEURONTIN) 100 MG capsule Take 1 capsule (100 mg total) by mouth 3 (three) times daily. 15 capsule 0  ? HYDROcodone-acetaminophen (NORCO) 7.5-325 MG tablet Take 1 tablet by mouth 4 (four) times daily. 12 tablet 0  ? meclizine (ANTIVERT) 12.5 MG tablet Take 1 tablet (12.5 mg total) by mouth 3 (three) times daily. 30 tablet 0  ? melatonin 5 MG TABS Take 1 tablet (5 mg total) by mouth at bedtime.  0  ? midodrine (PROAMATINE) 10 MG tablet Take 1 tablet (10 mg total) by mouth 3 (three) times daily. 90 tablet 2  ? ondansetron (ZOFRAN-ODT) 4 MG disintegrating tablet DISSOLVE 1 TABLET(4 MG) ON THE TONGUE EVERY 8 HOURS AS NEEDED FOR NAUSEA OR VOMITING (Patient taking differently: Take 4 mg by mouth every 8 (eight) hours as needed for nausea.) 45 tablet 2  ? PROAIR HFA 108 (90 Base) MCG/ACT inhaler Inhale 1 puff into the lungs every 6 (six) hours as needed for shortness of breath.  5  ? rOPINIRole (REQUIP) 1 MG tablet Take 2 tablets (2 mg total) by mouth at bedtime. (Patient taking differently: Take 1-2 mg by mouth See admin instructions. 1 tab at 4 pm ?2 tabs at bedtime)    ? senna (SENOKOT) 8.6 MG TABS tablet Take 1 tablet (8.6 mg total) by mouth daily. 120 tablet 0  ? torsemide (DEMADEX) 20 MG  tablet Take 1 tablet (20 mg total) by mouth daily. Starting Thursday 30 tablet 2  ? triamcinolone ointment (KENALOG) 0.1 % Apply 1 application. topically 2 (two) times daily as needed (rash, irritation).    ? Vi

## 2022-04-04 NOTE — Progress Notes (Signed)
Signed    ? ?   ?   ?   ?   ?   ?   ?   ?   ?   ?   ?   ?   ?   ?   ?   ?   ?   ?   ?   ?   ?   ?   ?   ?   ?   ?   ?   ?   ?   ?   ?   ?   ?   ?   ?   ?   ?   ?   ?   ?   ?   ?   ?   ?   ?   ?   ?   ?   ?   ?   ?   ?   ?   ?   ?   ?   ?   ?   ?   ?   ?   ?   ?   ?   ?   ?   ?   ?   ?   ?   ?   ?   ?   ?   ?   ?   ?   ?   ?   ?   ?   ?   ?   ?   ?   ?   ?   ?   ?   ?   ?   ?   ?   ?   ?   ?   ?   ?   ?   ?   ?   ?   ?   ?   ?   ?   ?   ?   ?   ?   ?   ?   ?   ?   ?   ?   ?   ?   ?   ?   ?   ?   ?   ?   ?   ?   ?   ?   ?   ?   ?   ?   ?   ?   ?   ?   ?   ?   ?   ?   ?   ?   ?   ?   ?   ?   ?   ?   ?   ?   ?   ?   ?   ?   ?   ?   ?   ?   ?   ?   ?   ?   ?   ?   ?   ?PMR Admission Coordinator Pre-Admission Assessment ?  ?Patient: Makinzey Banes Roskelley is an 76 y.o., female ?MRN: [id number] ?DOB: 03-10-1946 ?Height: '5\' 7"'$  (170.2 cm) ?Weight: 80 kg ?  ?Insurance Information ?HMO:     PPO:      PCP:      IPA:      80/20:    yes  OTHER:  ?HMO:     PPO:      PCP:      IPA:      80/20: ye     OTHER:  ?PRIMARY: Medicare Part A and B  Policy#: [id number]      Subscriber: Pt.  ?Phone#: Verified online on 0/30/09   Fax#:  ?Pre-Cert#:       Employer:  ?Benefits:  Phone #:      Name:  ?Eff. Date: Parts A ad B effective 07/15/2012  Deduct: $1600      Out of Pocket Max:  None      Life Max: N/A  ?CIR: 100%      SNF: 100 days ?Outpatient: 80%     Co-Pay: 20% ?Home Health: 100%      Co-Pay: none ?DME: 80%     Co-Pay: 20% ?Providers: patient's choice ?Providers: in network  ?SECONDARY: Engineer, production Supplement      Policy#: [id number]     Phone# ?  ?Financial Counselor:       Phone#:  ?  ?The ?Data Collection Information Summary? for patients in Inpatient Rehabilitation Facilities with attached ?Privacy Act [place] Records? was provided and verbally reviewed with: Patient ?  ?Emergency Contact Information ?Contact Information   ?  ?  Name Relation Home Work Mobile  ?  [place]   [phone]  ?  ?   ?  ?   ?Current Medical History  ?Patient Admitting Diagnosis: CHF, debility, PE ?History of Present Illness: Royce Sciara Keegan is a 76 y.o. female with medical history significant for HFpEF (EF 55-60%, G2DD by TTE 02/04/2022), pulmonary hypertension, history of cryptogenic stroke, CKD stage IIIb, orthostatic hypotension, HLD, anemia of chronic disease, history of gastric bypass, RLS, depression, and OSA on CPAP who presented to the ED4/16/23 for evaluation of shortness of breath. ? ?Patient recently hospitalized 02/03/2022-02/15/2022 for acute on chronic HFpEF exacerbation.  Cardiac amyloidosis work-up was equivocal with cMRI however PYP scan not suggestive.  She was diuresed with IV Lasix, 7 L negative.  Plan was for repeat PYP scan in 4-5 months.  Hospitalization was complicated by AKI on CKD stage III and orthostatic hypotension.  Midodrine was added for BP support.  Patient ultimately discharged to [facility] where she stayed until she was discharged to SNF on 03/04/2022 Patient states that she returned home from SNF on 4/15.In the ED vitals were stable.  BMP showed hypokalemia with a creatinine elevated at 1.5.  Chest x-ray showed bilateral pleural effusion, pulmonary edema and prominent right hilar region.  CTA of the chest showed segmental and subsegmental filling defects in the right upper lobe and left lower lobe.  In the ED, patient was given Lasix and oral potassium and was admitted hospital for further evaluation and treatment. 2D echocardiogram from 02/04/2022 with ejection fraction of 55 to 60%. CTA chest showing segmental and subsegmental pulmonary embolism in the right upper and lower lobe without right heart strain. CIR was consulted to assist return to PLOF.  ?  ?Patient's medical record from [facility] has been reviewed by the rehabilitation admission coordinator and physician. ?  ?Past Medical History  ?    ?Past Medical History:  ?Diagnosis Date  ? Anemia    ?  unable to absorb iron after gastric  bypass  ? Arthritis    ?  generalized  ? Asthma    ? Atrophic vaginitis    ? Back pain    ?  DDD/stenosis  ? Carotid stenosis    ?  Carotid US 10/16: Plaque RICA (8-93%), normal LICA  ? Depression    ?  takes Cymbalta daily  ? Diverticulosis    ?  benign  ? DJD (degenerative  joint disease)    ? Dyslipidemia    ? Dysrhythmia    ? Family history of GI bleeding    ? GERD (gastroesophageal reflux disease)    ?  takes Nexium daily  ? Gestational diabetes    ?  Pt denies  ? H/O hiatal hernia    ?  surgery for hernia  ? Headache(784.0)    ?  takes Imitrex daily as needed and Bisoprolol daily;last migraine was about 2wks ago  ? History of bronchitis 1 yr ago  ? History of shingles    ? Insomnia    ?  takes Ambien nightly  ? Joint pain    ? Joint swelling    ? Leg cramps    ?  takes Flexeril daily as needed  ? 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 Nocturia    ? Osteoporosis    ? Peripheral neuropathy    ?  takes Gabapentin daily  ? Pneumonia 66yr ago  ?  hx of  ? PONV (postoperative nausea and vomiting)    ? Restless leg syndrome    ?  takes Requip daily  ? RLS (restless legs syndrome) 08/16/2015  ? Sleep apnea    ?  uses CPAP  ? [facility]    ? Tubular adenoma of colon    ? Vertigo    ? Vitamin D deficiency    ?  ?  ?Has the patient had major surgery during 100 days prior to admission? Yes ?  ?Family History   ?family history includes Breast cancer in her maternal grandmother; CAD in her mother and paternal grandfather; Colon cancer in her father; Heart disease in her mother; Hypertension in her mother; Neuropathy in her mother; Osteoarthritis in her mother. ?  ?Current Medications ?  ?Current Facility-Administered Medications:  ?  acetaminophen (TYLENOL) tablet 650 mg, 650 mg, Oral, Q6H PRN **OR** acetaminophen (TYLENOL) suppository 650 mg, 650 mg, Rectal, Q6H PRN, PPosey Pronto Vishal R, MD ?  albuterol (PROVENTIL) (2.5 MG/3ML) 0.083% nebulizer solution 2.5 mg, 2.5 mg, Inhalation, Q6H PRN, Pokhrel, Laxman, MD ?  apixaban (ELIQUIS)  tablet 10 mg, 10 mg, Oral, BID, 10 mg at 04/04/22 0845 **FOLLOWED BY** [START ON 04/07/2022] apixaban (ELIQUIS) tablet 5 mg, 5 mg, Oral, BID, Pokhrel, Laxman, MD ?  baclofen (LIORESAL) tablet 5 mg, 5 mg, Oral, TID, Pokhrel, Laxman, MD, 5 mg at 04/04/22 0845 ?  DULoxetine (CYMBALTA) DR capsule 60 mg, 60 mg, Oral, BID, PLenore Cordia MD, 60 mg at 04/04/22 0845 ?  gabapentin (NEURONTIN) capsule 100 mg, 100 mg, Oral, TID, PZada FindersR, MD, 100 mg at 04/04/22 0845 ?  HYDROcodone-acetaminophen (NORCO) 7.5-325 MG per tablet 1-2 tablet, 1-2 tablet, Oral, Q6H PRN, Pokhrel, Laxman, MD, 2 tablet at 04/04/22 0602 ?  meclizine (ANTIVERT) tablet 12.5 mg, 12.5 mg, Oral, TID, Pokhrel, Laxman, MD, 12.5 mg at 04/04/22 0845 ?  melatonin tablet 5 mg, 5 mg, Oral, QHS, Pokhrel, Laxman, MD, 5 mg at 04/03/22 2158 ?  midodrine (PROAMATINE) tablet 10 mg, 10 mg, Oral, TID, MLarey Dresser MD, 10 mg at 04/04/22 0845 ?  nitroGLYCERIN (NITROSTAT) SL tablet 0.4 mg, 0.4 mg, Sublingual, Q5 min PRN, ADeatra Canter Amjad, PA-C, 0.4 mg at 03/30/22 2222 ?  ondansetron (ZOFRAN) tablet 4 mg, 4 mg, Oral, Q6H PRN, 4 mg at 04/03/22 1652 **OR** ondansetron (ZOFRAN) injection 4 mg, 4 mg, Intravenous, Q6H PRN, PPosey Pronto Vishal R, MD ?  pantoprazole (PROTONIX) EC tablet 40 mg, 40 mg, Oral, Daily, Pokhrel, Laxman, MD, 40  mg at 04/04/22 0844 ?  predniSONE (DELTASONE) tablet 40 mg, 40 mg, Oral, Q breakfast, Lyda Jester M, PA-C, 40 mg at 04/04/22 0602 ?  rOPINIRole (REQUIP) tablet 1 mg, 1 mg, Oral, Daily, Zada Finders R, MD, 1 mg at 04/03/22 1652 ?  rOPINIRole (REQUIP) tablet 2 mg, 2 mg, Oral, QHS, Gardner, Jared M, DO, 2 mg at 04/03/22 2157 ?  senna-docusate (Senokot-S) tablet 1 tablet, 1 tablet, Oral, QHS PRN, Lenore Cordia, MD, 1 tablet at 04/02/22 2213 ?  torsemide (DEMADEX) tablet 20 mg, 20 mg, Oral, Daily, Larey Dresser, MD, 20 mg at 04/04/22 0844 ?  ?Patients Current Diet:  ?Diet Order   ?  ?         ?    Diet Heart Room service appropriate? Yes; Fluid  consistency: Thin; Fluid restriction: 1500 mL Fluid  Diet effective now       ?  ?  ?   ?  ?  ?   ?  ?  ?Precautions / Restrictions ?Precautions ?Precautions: Fall, Other (comment) ?Precaution Comments:

## 2022-04-04 NOTE — Progress Notes (Signed)
?  Mobility Specialist Criteria Algorithm Info. ? ? 04/04/22 1030  ?Therapy Vitals  ?BP 102/68  ?Patient Position (if appropriate) Sitting  ?Mobility  ?Activity Ambulated with assistance in hallway;Ambulated with assistance to bathroom  ?Range of Motion/Exercises Active;All extremities  ?Level of Assistance Contact guard assist, steadying assist ?(min A sit>stand)  ?Assistive Device Front wheel walker  ?RUE Weight Bearing WBAT  ?Distance Ambulated (ft) 180 ft  ?Activity Response Tolerated well  ? ?Patient received dangling EOB feeling better than initial interaction, agreed to participate in mobility. Required min A sit>stand + cues for hand placement. Ambulated in hallway min guard with slow gait. Was able to extend distance from past sessions. Returned to room without complaint or incident. Was left in restroom with all needs met, NT present.  ? ?04/04/2022 ?1:17 PM ? ?[place] Ashlee Bewley, CMS, BS EXP ?Acute Rehabilitation Services  ?SKSHN:[phone] ?Office: [phone] ? ?

## 2022-04-05 DIAGNOSIS — R5381 Other malaise: Secondary | ICD-10-CM | POA: Diagnosis not present

## 2022-04-05 MED ORDER — SORBITOL 70 % SOLN
30.0000 mL | Freq: Once | Status: AC
  Administered 2022-04-05: 30 mL via ORAL
  Filled 2022-04-05: qty 30

## 2022-04-05 MED ORDER — PHENOL 1.4 % MT LIQD
1.0000 | OROMUCOSAL | Status: DC | PRN
  Administered 2022-04-05 – 2022-04-13 (×7): 1 via OROMUCOSAL
  Filled 2022-04-05: qty 177

## 2022-04-05 NOTE — Progress Notes (Signed)
?                                                       PROGRESS NOTE ? ? ?Subjective/Complaints: ? ?Pt reports got "tons of fluids off her".  ?Also developed blood clots.  ?Had some "chest pain"- reproducible last night on left side of chest- went away spontaneously.  ?Trying to have BM- been a few days- needs to go but cannot "get it out".  ? ? ?ROS: ? ?Pt denies SOB, abd pain, CP, N/V/(+) C/D, and vision changes ? ? ?Objective: ?  ?No results found. ?Recent Labs  ?  04/04/22 ?0038  ?WBC 5.5  ?HGB 10.1*  ?HCT 31.1*  ?PLT 252  ? ?Recent Labs  ?  04/03/22 ?7124 04/04/22 ?0038  ?NA 136 136  ?K 5.0 4.6  ?CL 98 102  ?CO2 27 26  ?GLUCOSE 91 100*  ?BUN 44* 44*  ?CREATININE 2.25* 1.91*  ?CALCIUM 9.2 9.2  ? ? ?Intake/Output Summary (Last 24 hours) at 04/05/2022 1603 ?Last data filed at 04/05/2022 0720 ?Gross per 24 hour  ?Intake 350 ml  ?Output --  ?Net 350 ml  ?  ? ?  ? ?Physical Exam: ?Vital Signs ?Blood pressure (!) 110/53, pulse (!) 102, temperature 97.9 ?F (36.6 ?C), resp. rate 15, height '5\' 7"'$  (1.702 m), weight 78.6 kg, SpO2 97 %. ? ? ?General: awake, alert, appropriate, sitting on toilet; pushing some to try and have BM; OT in room; NAD ?HENT: conjugate gaze; oropharynx moist ?CV: borderline tachycardic rate; no JVD ?Pulmonary: CTA B/L; no W/R/R- good air movement ?GI: soft, NT, ND, (+)BS ?Psychiatric: appropriate- bright ?Neurological: Ox3 ?Neurological:  ?   Comments: Patient is alert.  No acute distress and follows commands. Sitting at the edge of bed and breathing comfortably on room air while eating her lunch. No focal deficits ?Extremities; less LE edema- looks better than last admission ? ? ?Assessment/Plan: ?1. Functional deficits which require 3+ hours per day of interdisciplinary therapy in a comprehensive inpatient rehab setting. ?Physiatrist is providing close team supervision and 24 hour management of active medical problems listed below. ?Physiatrist and rehab team continue to assess barriers to  discharge/monitor patient progress toward functional and medical goals ? ?Care Tool: ? ?Bathing ?   ?Body parts bathed by patient: Right arm, Left arm, Chest, Face, Abdomen, Front perineal area, Right upper leg, Left upper leg, Buttocks  ? Body parts bathed by helper: Buttocks, Right lower leg, Left lower leg ?  ?  ?Bathing assist Assist Level: Minimal Assistance - Patient > 75% ?  ?  ?Upper Body Dressing/Undressing ?Upper body dressing   ?What is the patient wearing?: Pull over shirt ?   ?Upper body assist Assist Level: Supervision/Verbal cueing ?   ?Lower Body Dressing/Undressing ?Lower body dressing ? ? ?   ?What is the patient wearing?: Pants, Underwear/pull up ? ?  ? ?Lower body assist Assist for lower body dressing: Moderate Assistance - Patient 50 - 74% ?   ? ?Toileting ?Toileting    ?Toileting assist Assist for toileting: Contact Guard/Touching assist ?  ?  ?Transfers ?Chair/bed transfer ? ?Transfers assist ?   ? ?Chair/bed transfer assist level: Contact Guard/Touching assist ?  ?  ?Locomotion ?Ambulation ? ? ?Ambulation assist ? ?   ? ?  ?  ?   ? ?  Walk 10 feet activity ? ? ?Assist ?   ? ?  ?   ? ?Walk 50 feet activity ? ? ?Assist   ? ?  ?   ? ? ?Walk 150 feet activity ? ? ?Assist   ? ?  ?  ?  ? ?Walk 10 feet on uneven surface  ?activity ? ? ?Assist   ? ? ?  ?   ? ?Wheelchair ? ? ? ? ?Assist   ?  ?  ? ?  ?   ? ? ?Wheelchair 50 feet with 2 turns activity ? ? ? ?Assist ? ?  ?  ? ? ?   ? ?Wheelchair 150 feet activity  ? ? ? ?Assist ?   ? ? ?   ? ?Blood pressure (!) 110/53, pulse (!) 102, temperature 97.9 ?F (36.6 ?C), resp. rate 15, height '5\' 7"'$  (1.702 m), weight 78.6 kg, SpO2 97 %. ? ?Medical Problem List and Plan: ?1. Functional deficits secondary to debility related to CHF exacerbation/pulmonary emboli/gout/history of CVA ?            -patient may shower ?            -ELOS/Goals: 5-7 days ?            -first day of evaluations- con't CIR_ PT and OT ?2.  Antithrombotics: ?-DVT/anticoagulation:   Pharmaceutical: Other (comment) Eliquis ?            -antiplatelet therapy: N/A ?3. Pain Management/chronic pain syndrome: Baclofen 5 mg 3 times daily, Neurontin 100 mg 3 times daily, hydrocodone as needed ? 4/22- pain controlled- con't regimen ?4. Mood: Cymbalta 60 mg twice daily, melatonin 5 mg nightly ?            -antipsychotic agents: N/A ?5. Neuropsych: This patient is capable of making decisions on her own behalf. ?6. Skin/Wound Care: Routine skin checks ?7. Fluids/Electrolytes/Nutrition: Routine in and outs with follow-up chemistries ?8.  Acute on chronic diastolic congestive heart failure.  Follow-up per heart failure team.  Monitor for any signs of fluid overload.  Continue Demadex 20 mg daily ?9.  Hypotension.  ProAmatine 10 mg 3 times daily.  Monitor with increased mobility ?10.  Gout.  Uric acid 11.1.  Prednisone taper ?11.  Restless leg syndrome.  Continue Requip ?12.  OSA.  Continue CPAP ?13.  CKD stage III.   ?4/22- will check labs Monday- last Cr better at 1.91 from 2.25-   ?14.  Obesity with history of gastric bypass.  BMI 27.62 ? 4/22- BMI 27.14-  ?15.  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 Hypokalemia ? 4/22- at risk since on Demadex- will recheck Monday.  ?17. Constipation ? 4/22- LBM 3+ days ago- will give sorbitol cannot push stool out- might need enema? ? ? ?I spent a total of 35   minutes on total care today- >50% coordination of care- due to d/w NP about pt and constipation as well as likely noncardiac pain.  ? ?  ? ?LOS: ?1 days ?A FACE TO FACE EVALUATION WAS PERFORMED ? ?Zohar Maroney ?04/05/2022, 4:03 PM  ? ? ? ?

## 2022-04-05 NOTE — Plan of Care (Signed)
?  Problem: RH Balance ?Goal: LTG Patient will maintain dynamic sitting balance (PT) ?Description: LTG:  Patient will maintain dynamic sitting balance with assistance during mobility activities (PT) ?Flowsheets (Taken 04/05/2022 2357) ?LTG: Pt will maintain dynamic sitting balance during mobility activities with:: Independent with assistive device  ?Goal: LTG Patient will maintain dynamic standing balance (PT) ?Description: LTG:  Patient will maintain dynamic standing balance with assistance during mobility activities (PT) ?Flowsheets (Taken 04/05/2022 2357) ?LTG: Pt will maintain dynamic standing balance during mobility activities with:: Supervision/Verbal cueing ?  ?Problem: Sit to Stand ?Goal: LTG:  Patient will perform sit to stand with assistance level (PT) ?Description: LTG:  Patient will perform sit to stand with assistance level (PT) ?Flowsheets (Taken 04/05/2022 2357) ?LTG: PT will perform sit to stand in preparation for functional mobility with assistance level: Supervision/Verbal cueing ?  ?Problem: RH Bed Mobility ?Goal: LTG Patient will perform bed mobility with assist (PT) ?Description: LTG: Patient will perform bed mobility with assistance, with/without cues (PT). ?Flowsheets (Taken 04/05/2022 2357) ?LTG: Pt will perform bed mobility with assistance level of: Independent with assistive device  ?  ?Problem: RH Bed to Chair Transfers ?Goal: LTG Patient will perform bed/chair transfers w/assist (PT) ?Description: LTG: Patient will perform bed to chair transfers with assistance (PT). ?Flowsheets (Taken 04/05/2022 2357) ?LTG: Pt will perform Bed to Chair Transfers with assistance level: Supervision/Verbal cueing ?  ?Problem: RH Car Transfers ?Goal: LTG Patient will perform car transfers with assist (PT) ?Description: LTG: Patient will perform car transfers with assistance (PT). ?Flowsheets (Taken 04/05/2022 2357) ?LTG: Pt will perform car transfers with assist:: Supervision/Verbal cueing ?  ?Problem: RH  Ambulation ?Goal: LTG Patient will ambulate in controlled environment (PT) ?Description: LTG: Patient will ambulate in a controlled environment, # of feet with assistance (PT). ?Flowsheets (Taken 04/05/2022 2357) ?LTG: Pt will ambulate in controlled environ  assist needed:: Supervision/Verbal cueing ?LTG: Ambulation distance in controlled environment: 152f using LRAD ?Goal: LTG Patient will ambulate in home environment (PT) ?Description: LTG: Patient will ambulate in home environment, # of feet with assistance (PT). ?Flowsheets (Taken 04/05/2022 2357) ?LTG: Pt will ambulate in home environ  assist needed:: Supervision/Verbal cueing ?LTG: Ambulation distance in home environment: 539fusing LRAD ?  ?

## 2022-04-05 NOTE — Progress Notes (Signed)
Pt already wearing CPAP. RT will monitor as needed.  ?

## 2022-04-05 NOTE — Progress Notes (Signed)
Physical Therapy Session Note ? ?Patient Details  ?Name: [name] ?MRN: [id number] ?Date of Birth: Nov 01, 1946 ? ?Today's Date: 04/05/2022 ?PT Individual Time: 0263-7858 ?PT Individual Time Calculation (min): 29 min  ? ?Short Term Goals: ?Week 1:  PT Short Term Goal 1 (Week 1): Pt will perform supine<>sit with supervision ?PT Short Term Goal 2 (Week 1): Pt will perform sit<>stands using LRAD with CGA ?PT Short Term Goal 3 (Week 1): Pt will perform bed<>chair transfers using LRAD with CGA ?PT Short Term Goal 4 (Week 1): Pt will ambulate at least 120f using LRAD with CGA ?PT Short Term Goal 5 (Week 1): Pt will participate in an outcome measure to assess fall risk ? ?Skilled Therapeutic Interventions/Progress Updates:  ?  Pt received sitting in recliner and agreeable to therapy session. Pt already wearing B LE thigh high TED hose and her personal abdominal binder as well as tennis shoes. Sit>stand recliner>RW with CGA/light min assist for lifting to stand - cuing for increased anterior trunk lean and powering up with B LE hip/knee extension. Short distance ~822fambulatory transfer to w/c using RW with CGA for safety - demos improved foot clearance while wearing shoes and pt reports she knows she must clear her toes otherwise her shoe grip will stick to the ground causing her to trip and making her off balance.  Transported to/from gym in w/c for time management and energy conservation. Simulated ambulatory car transfer using RW to SUV height vehicle with CGA for steadying and light min assist for managing LEs in/out vehicle - pt reports difficulty with this at baseline. Gait training ~1524fp/down ramp using RW with CGA for steadying and min verbal cuing for safe AD management - pt demos slow gait speed with slight reciprocal stepping. Transported back to room and pt agreeable to remain sitting up. Ambulatory transfer back to recliner using RW as described above. Pt left seated in recliner with needs in reach, B LEs  elevated, and seat belt alarm on. ? ?Therapy Documentation ?Precautions:  ?Precautions ?Precautions: Fall, Other (comment) ?Precaution Comments: Watch BP; has TEDs and abdominal binder ?Other Brace: Thigh high TEDs and abdominal binder for BP control ?Restrictions ?Weight Bearing Restrictions: No ?RUE Weight Bearing: Weight bearing as tolerated ? ? ?Pain: ? No complaints of pain throughout session. ? ? ?Therapy/Group: Individual Therapy ? ?CarTawana ScalePT, DPT, NCS, CSRS ?04/05/2022, 6:28 PM  ?

## 2022-04-05 NOTE — Evaluation (Signed)
Occupational Therapy Assessment and Plan ? ?Patient Details  ?Name: [name] ?MRN: [id number] ?Date of Birth: 12-Jan-1946 ? ?OT Diagnosis: muscle weakness (generalized) and decreased activity tolerance, impaired sensation, decreased RUE ROM ?Rehab Potential: Rehab Potential (ACUTE ONLY): Good ?ELOS: 7-10 days  ? ?Today's Date: 04/05/2022 ?OT Individual Time: 7026-3785 ?OT Individual Time Calculation (min): 80 min  + 34 min ? ?Hospital Problem: Principal Problem: ?  Debility ? ? ?Past Medical History:  ?Past Medical History:  ?Diagnosis Date  ? Anemia   ? unable to absorb iron after gastric bypass  ? Arthritis   ? generalized  ? Asthma   ? Atrophic vaginitis   ? Back pain   ? DDD/stenosis  ? Carotid stenosis   ? Carotid US 10/16: Plaque RICA (8-85%), normal LICA  ? Depression   ? takes Cymbalta daily  ? Diverticulosis   ? benign  ? DJD (degenerative joint disease)   ? Dyslipidemia   ? Dysrhythmia   ? Family history of GI bleeding   ? GERD (gastroesophageal reflux disease)   ? takes Nexium daily  ? Gestational diabetes   ? Pt denies  ? H/O hiatal hernia   ? surgery for hernia  ? Headache(784.0)   ? takes Imitrex daily as needed and Bisoprolol daily;last migraine was about 2wks ago  ? History of bronchitis 1 yr ago  ? History of shingles   ? Insomnia   ? takes Ambien nightly  ? Joint pain   ? Joint swelling   ? Leg cramps   ? takes Flexeril daily as needed  ? 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 Nocturia   ? Osteoporosis   ? Peripheral neuropathy   ? takes Gabapentin daily  ? Pneumonia 79yr ago  ? hx of  ? PONV (postoperative nausea and vomiting)   ? Restless leg syndrome   ? takes Requip daily  ? RLS (restless legs syndrome) 08/16/2015  ? Sleep apnea   ? uses CPAP  ? Stroke ([facility]   ? Tubular adenoma of colon   ? Vertigo   ? Vitamin D deficiency   ? ?Past Surgical History:  ?Past Surgical History:  ?Procedure Laterality Date  ? COLONOSCOPY    ? EP IMPLANTABLE DEVICE N/A 11/19/2016  ? Procedure: Loop Recorder Insertion;   Surgeon: SDeboraha Sprang MD;  [facility];  Service: Cardiovascular;  Laterality: N/A;  ? ESOPHAGOGASTRODUODENOSCOPY    ? excess skin removal    ? post weight loss  ? GASTRIC BYPASS  2001  ? HERNIA REPAIR    ? umbilical  ? JOINT REPLACEMENT Right   ? knee  ? REVERSE SHOULDER ARTHROPLASTY Right 04/25/2021  ? Procedure: REVERSE SHOULDER ARTHROPLASTY carpal tunel realase right wrist;  Surgeon: CTania Ade MD;  Location: [facility];  Service: Orthopedics;  Laterality: Right;  ? RIGHT HEART CATH N/A 02/06/2022  ? Procedure: RIGHT HEART CATH;  Surgeon: SBelva Crome MD;  [facility];  Service: Cardiovascular;  Laterality: N/A;  ? skin reduction  2003 2004  ? stomach and arm  ? STEROID INJECTION TO SCAR    ? x 2 in back   ? TOTAL KNEE ARTHROPLASTY Left 04/03/2015  ? Procedure: LEFT TOTAL KNEE ARTHROPLASTY;  Surgeon: PMelrose Nakayama MD;  [facility]  Service: Orthopedics;  Laterality: Left;  ? WISDOM TOOTH EXTRACTION    ? ? ?Assessment & Plan ?Clinical Impression: Patient is a 76y.o. year old female with history of RLS with  chronic pain syndrome, depression, hyperlipidemia, asthma, cryptogenic infarction, obesity with BMI 27.62, CKD stage III, gastric bypass, OSA on CPAP, orthostasis, chronic diastolic congestive heart failure with ejection fraction of 55 to 60%.  Recent admission  02/03/2022 - 02/15/2022 for acute on chronic exacerbation of CHF as well as admission to CIR 02/15/2022 - 03/04/2022 for debility related to CHF.  She was diuresed with IV Lasix, 7 L negative.  Presented 03/30/2022 with increasing shortness of breath and swelling of lower extremities.  She did have some central chest discomfort.  Per chart review patient lives with spouse.  1 level home.  Chest x-ray showed bilateral pleural effusions.  Possible mild pulmonary edema.  CT angiogram of the chest showed segmental and subsegmental pulmonary emboli with pulmonary artery filling defect in the right upper lobe and left  lower lobe.  No evidence of right heart strain.  Venous Doppler studies consistent with acute DVT involving the left gastrocnemius vein.  Admission chemistries unremarkable except potassium 3.2 BUN 41, creatinine 1.59, hemoglobin 10.1, troponin 170-159, uric acid 11.1.  Echocardiogram ejection fraction of 55 to 60% no wall motion abnormalities.  Patient was placed on heparin drip and intravenous Lasix for diuresis transition to Eliquis.  She was placed on prednisone for gout flareup.  Follow-up heart failure team chest pain felt to be related to pulmonary emboli recent Cardiolite study negative.  Elevated troponin felt to be related to demand ischemia.  Therapy evaluations completed due to patient decreased functional mobility was admitted for a comprehensive rehab program. Denies shortness of breath currently, but experiences with exertion..  Patient transferred to CIR on 04/04/2022 .   ? ?Patient currently requires  S to max A  with basic self-care skills secondary to muscle weakness, decreased cardiorespiratoy endurance, impaired timing and sequencing and unbalanced muscle activation, diplopia/disturbance of vision, and decreased standing balance, decreased balance strategies, and decreased sensation .  Prior to hospitalization, patient could complete BADL/IADL/mobility with independent . ? ?Patient will benefit from skilled intervention to decrease level of assist with basic self-care skills, increase independence with basic self-care skills, and increase level of independence with iADL prior to discharge home with care partner.  Anticipate patient will require intermittent supervision and follow up outpatient. ? ?OT - End of Session ?Activity Tolerance: Tolerates 10 - 20 min activity with multiple rests ?Endurance Deficit: Yes ?Endurance Deficit Description: generalized deconditioning, orthostatic hypotension ?OT Assessment ?Rehab Potential (ACUTE ONLY): Good ?OT Barriers to Discharge: Inaccessible home  environment;Decreased caregiver support ?OT Patient demonstrates impairments in the following area(s): Balance;Cognition;Endurance;Motor;Sensory;Vision ?OT Basic ADL's Functional Problem(s): Grooming;Bathing;Dressing;Toileting ?OT Advanced ADL's Functional Problem(s): Simple Meal Preparation;Light Housekeeping ?OT Transfers Functional Problem(s): Toilet;Tub/Shower ?OT Additional Impairment(s): None ?OT Plan ?OT Intensity: Minimum of 1-2 x/day, 45 to 90 minutes ?OT Frequency: 5 out of 7 days ?OT Duration/Estimated Length of Stay: 7-10 days ?OT Treatment/Interventions: Balance/vestibular training;Discharge planning;Pain management;Self Care/advanced ADL retraining;Therapeutic Activities;UE/LE Coordination activities;Visual/perceptual remediation/compensation;Therapeutic Exercise;Skin care/wound managment;Cognitive remediation/compensation;Disease mangement/prevention;Functional mobility training;Patient/family education;Community reintegration;DME/adaptive equipment instruction;Psychosocial support;UE/LE Strength taining/ROM;Wheelchair propulsion/positioning;Splinting/orthotics;Neuromuscular re-education ?OT Self Feeding Anticipated Outcome(s): set-up A ?OT Basic Self-Care Anticipated Outcome(s): S to mod I ?OT Toileting Anticipated Outcome(s): S to mod I ?OT Bathroom Transfers Anticipated Outcome(s): mod I ?OT Recommendation ?Patient destination: Home ?Follow Up Recommendations: Outpatient OT ?Equipment Recommended: To be determined ? ? ?OT Evaluation ?Precautions/Restrictions  ?Precautions ?Precautions: Fall;Other (comment) ?Precaution Comments: Watch BP; has TEDs and abdominal binder ?Other Brace: Thigh high TEDs and abdominal binder for BP control ?General ?Chart Reviewed: Yes ?Response to Previous Treatment: Patient with  no complaints from previous session ?Family/Caregiver Present: No ? ?Pain ?Pain Assessment ?Pain Scale: 0-10 ?Pain Score: 8  ?Pain Intervention(s): Medication (See eMAR) ?Home Living/Prior  Functioning ?Home Living ?Family/patient expects to be discharged to:: Private residence ?Living Arrangements: Spouse/significant other ?Available Help at Discharge: Family, Available 24 hours/day (pt's husband works

## 2022-04-05 NOTE — Evaluation (Signed)
Physical Therapy Assessment and Plan ? ?Patient Details  ?Name: [name] ?MRN: [id number] ?Date of Birth: 1946/11/09 ? ?PT Diagnosis: Abnormal posture, Abnormality of gait, Cognitive deficits, Difficulty walking, Edema, Impaired sensation, Muscle weakness, Pain in joint, and Pain in hands ?Rehab Potential: Good  ?ELOS: 1-1.5 weeks  ? ?Today's Date: 04/05/2022 ?PT Individual Time: 4315-4008 ?PT Individual Time Calculation (min): 75 min   ? ?Hospital Problem: Principal Problem: ?  Debility ? ? ?Past Medical History:  ?Past Medical History:  ?Diagnosis Date  ? Anemia   ? unable to absorb iron after gastric bypass  ? Arthritis   ? generalized  ? Asthma   ? Atrophic vaginitis   ? Back pain   ? DDD/stenosis  ? Carotid stenosis   ? Carotid US 10/16: Plaque RICA (6-76%), normal LICA  ? Depression   ? takes Cymbalta daily  ? Diverticulosis   ? benign  ? DJD (degenerative joint disease)   ? Dyslipidemia   ? Dysrhythmia   ? Family history of GI bleeding   ? GERD (gastroesophageal reflux disease)   ? takes Nexium daily  ? Gestational diabetes   ? Pt denies  ? H/O hiatal hernia   ? surgery for hernia  ? Headache(784.0)   ? takes Imitrex daily as needed and Bisoprolol daily;last migraine was about 2wks ago  ? History of bronchitis 1 yr ago  ? History of shingles   ? Insomnia   ? takes Ambien nightly  ? Joint pain   ? Joint swelling   ? Leg cramps   ? takes Flexeril daily as needed  ? 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 Nocturia   ? Osteoporosis   ? Peripheral neuropathy   ? takes Gabapentin daily  ? Pneumonia 9yr ago  ? hx of  ? PONV (postoperative nausea and vomiting)   ? Restless leg syndrome   ? takes Requip daily  ? RLS (restless legs syndrome) 08/16/2015  ? Sleep apnea   ? uses CPAP  ? Stroke ([facility]   ? Tubular adenoma of colon   ? Vertigo   ? Vitamin D deficiency   ? ?Past Surgical History:  ?Past Surgical History:  ?Procedure Laterality Date  ? COLONOSCOPY    ? EP IMPLANTABLE DEVICE N/A 11/19/2016  ? Procedure: Loop Recorder  Insertion;  Surgeon: SDeboraha Sprang MD;  [facility];  Service: Cardiovascular;  Laterality: N/A;  ? ESOPHAGOGASTRODUODENOSCOPY    ? excess skin removal    ? post weight loss  ? GASTRIC BYPASS  2001  ? HERNIA REPAIR    ? umbilical  ? JOINT REPLACEMENT Right   ? knee  ? REVERSE SHOULDER ARTHROPLASTY Right 04/25/2021  ? Procedure: REVERSE SHOULDER ARTHROPLASTY carpal tunel realase right wrist;  Surgeon: CTania Ade MD;  Location: [facility];  Service: Orthopedics;  Laterality: Right;  ? RIGHT HEART CATH N/A 02/06/2022  ? Procedure: RIGHT HEART CATH;  Surgeon: SBelva Crome MD;  [facility];  Service: Cardiovascular;  Laterality: N/A;  ? skin reduction  2003 2004  ? stomach and arm  ? STEROID INJECTION TO SCAR    ? x 2 in back   ? TOTAL KNEE ARTHROPLASTY Left 04/03/2015  ? Procedure: LEFT TOTAL KNEE ARTHROPLASTY;  Surgeon: PMelrose Nakayama MD;  [facility]  Service: Orthopedics;  Laterality: Left;  ? WISDOM TOOTH EXTRACTION    ? ? ?Assessment & Plan ?Clinical Impression: Patient is a 76y.o.  right-handed female with  history of RLS with chronic pain syndrome, depression, hyperlipidemia, asthma, cryptogenic infarction, obesity with BMI 27.62, CKD stage III, gastric bypass, OSA on CPAP, orthostasis, chronic diastolic congestive heart failure with ejection fraction of 55 to 60%.  Recent admission  02/03/2022 - 02/15/2022 for acute on chronic exacerbation of CHF as well as admission to CIR 02/15/2022 - 03/04/2022 for debility related to CHF.  She was diuresed with IV Lasix, 7 L negative.  Presented 03/30/2022 with increasing shortness of breath and swelling of lower extremities.  She did have some central chest discomfort.  Per chart review patient lives with spouse.  1 level home.  Chest x-ray showed bilateral pleural effusions.  Possible mild pulmonary edema.  CT angiogram of the chest showed segmental and subsegmental pulmonary emboli with pulmonary artery filling defect in the right  upper lobe and left lower lobe.  No evidence of right heart strain.  Venous Doppler studies consistent with acute DVT involving the left gastrocnemius vein.  Admission chemistries unremarkable except potassium 3.2 BUN 41, creatinine 1.59, hemoglobin 10.1, troponin 170-159, uric acid 11.1.  Echocardiogram ejection fraction of 55 to 60% no wall motion abnormalities.  Patient was placed on heparin drip and intravenous Lasix for diuresis transition to Eliquis.  She was placed on prednisone for gout flareup.  Follow-up heart failure team chest pain felt to be related to pulmonary emboli recent Cardiolite study negative.  Elevated troponin felt to be related to demand ischemia.  Therapy evaluations completed due to patient decreased functional mobility was admitted for a comprehensive rehab program. Denies shortness of breath currently, but experiences with exertion.  Patient transferred to CIR on 04/04/2022 .  ? ?Patient currently requires min assist with mobility secondary to muscle weakness and muscle joint tightness, decreased cardiorespiratoy endurance,  , visual impairment, decreased awareness, decreased memory, and delayed processing, and decreased standing balance, decreased postural control, and decreased balance strategies.  Prior to hospitalization, patient was modified independent  with mobility and lived with Spouse (husband, Hoy Morn) in a House home.  Home access is  Ramped entrance (small incline in back door - paved). ? ?Patient will benefit from skilled PT intervention to maximize safe functional mobility, minimize fall risk, and decrease caregiver burden for planned discharge home with 24 hour supervision.  Anticipate patient will benefit from follow up OP at discharge. ? ?PT - End of Session ?Activity Tolerance: Tolerates 30+ min activity with multiple rests ?Endurance Deficit: Yes ?Endurance Deficit Description: generalized deconditioning ?PT Assessment ?Rehab Potential (ACUTE/IP ONLY): Good ?PT Barriers  to Discharge: Wound Care;Weight ?PT Patient demonstrates impairments in the following area(s): Balance;Nutrition;Skin Integrity;Behavior;Pain;Edema;Perception;Endurance;Safety;Motor;Sensory ?PT Transfers Functional Problem(s): Bed Mobility;Bed to Chair;Car;Furniture ?PT Locomotion Functional Problem(s): Ambulation;Wheelchair Mobility;Stairs ?PT Plan ?PT Intensity: Minimum of 1-2 x/day ,45 to 90 minutes ?PT Frequency: 5 out of 7 days ?PT Duration Estimated Length of Stay: 1-1.5 weeks ?PT Treatment/Interventions: Ambulation/gait training;Cognitive remediation/compensation;Discharge planning;DME/adaptive equipment instruction;Functional mobility training;Pain management;Psychosocial support;Therapeutic Activities;Splinting/orthotics;UE/LE Strength taining/ROM;Visual/perceptual remediation/compensation;Wheelchair propulsion/positioning;UE/LE Coordination activities;Therapeutic Exercise;Stair training;Skin care/wound management;Patient/family education;Neuromuscular re-education;Functional electrical stimulation;Disease management/prevention;Community reintegration;Balance/vestibular training ?PT Transfers Anticipated Outcome(s): supervision using LRAD ?PT Locomotion Anticipated Outcome(s): supervision using LRAD ?PT Recommendation ?Recommendations for Other Services: Therapeutic Recreation consult ?Therapeutic Recreation Interventions: Stress management;Kitchen group ?Follow Up Recommendations: Outpatient PT ?Patient destination: Home ?Equipment Recommended: To be determined ? ? ?PT Evaluation ?Precautions/Restrictions ?Precautions ?Precautions: Fall;Other (comment) ?Precaution Comments: Watch BP; has TEDs and abdominal binder, limited R shoulder ROM ?Other Brace: Thigh high TEDs and abdominal binder for BP control ?Restrictions ?Weight Bearing Restrictions: Yes ?RUE Weight Bearing:  Weight bearing as tolerated ?Other Position/Activity Restrictions: R wrist fx and L 5th finger fx ?Pain ?Pain Assessment ?Pain Scale:  0-10 ?Pain Score: 7  ?Pain Type: Chronic pain ?Pain Location: Other (Comment) ("feet, legs, arms, and hands") ?Pain Orientation: Right;Left ?Pain Descriptors / Indicators: Aching;Other (Comment) ("constant.Marland KitchenMarland Kitchen

## 2022-04-05 NOTE — Plan of Care (Signed)
?  Problem: Sit to Stand ?Goal: LTG:  Patient will perform sit to stand in prep for activites of daily living with assistance level (OT) ?Description: LTG:  Patient will perform sit to stand in prep for activites of daily living with assistance level (OT) ?Flowsheets (Taken 04/05/2022 1232) ?LTG: PT will perform sit to stand in prep for activites of daily living with assistance level: Independent with assistive device ?  ?Problem: RH Grooming ?Goal: LTG Patient will perform grooming w/assist,cues/equip (OT) ?Description: LTG: Patient will perform grooming with assist, with/without cues using equipment (OT) ?Flowsheets (Taken 04/05/2022 1232) ?LTG: Pt will perform grooming with assistance level of: Independent with assistive device  ?  ?Problem: RH Bathing ?Goal: LTG Patient will bathe all body parts with assist levels (OT) ?Description: LTG: Patient will bathe all body parts with assist levels (OT) ?Flowsheets (Taken 04/05/2022 1232) ?LTG: Pt will perform bathing with assistance level/cueing: Independent with assistive device  ?  ?Problem: RH Dressing ?Goal: LTG Patient will perform upper body dressing (OT) ?Description: LTG Patient will perform upper body dressing with assist, with/without cues (OT). ?Flowsheets (Taken 04/05/2022 1232) ?LTG: Pt will perform upper body dressing with assistance level of: Independent with assistive device ?Goal: LTG Patient will perform lower body dressing w/assist (OT) ?Description: LTG: Patient will perform lower body dressing with assist, with/without cues in positioning using equipment (OT) ?Flowsheets (Taken 04/05/2022 1232) ?LTG: Pt will perform lower body dressing with assistance level of: Supervision/Verbal cueing ?  ?Problem: RH Toileting ?Goal: LTG Patient will perform toileting task (3/3 steps) with assistance level (OT) ?Description: LTG: Patient will perform toileting task (3/3 steps) with assistance level (OT)  ?Flowsheets (Taken 04/05/2022 1232) ?LTG: Pt will perform toileting  task (3/3 steps) with assistance level: Independent with assistive device ?  ?Problem: RH Simple Meal Prep ?Goal: LTG Patient will perform simple meal prep w/assist (OT) ?Description: LTG: Patient will perform simple meal prep with assistance, with/without cues (OT). ?Flowsheets (Taken 04/05/2022 1232) ?LTG: Pt will perform simple meal prep with assistance level of: Supervision/Verbal cueing ?  ?Problem: RH Toilet Transfers ?Goal: LTG Patient will perform toilet transfers w/assist (OT) ?Description: LTG: Patient will perform toilet transfers with assist, with/without cues using equipment (OT) ?Flowsheets (Taken 04/05/2022 1232) ?LTG: Pt will perform toilet transfers with assistance level of: Independent with assistive device ?  ?Problem: RH Tub/Shower Transfers ?Goal: LTG Patient will perform tub/shower transfers w/assist (OT) ?Description: LTG: Patient will perform tub/shower transfers with assist, with/without cues using equipment (OT) ?Flowsheets (Taken 04/05/2022 1232) ?LTG: Pt will perform tub/shower stall transfers with assistance level of: Supervision/Verbal cueing ?  ?Problem: RH Furniture Transfers ?Goal: LTG Patient will perform furniture transfers w/assist (OT/PT) ?Description: LTG: Patient will perform furniture transfers  with assistance (OT/PT). ?Flowsheets (Taken 04/05/2022 1232) ?LTG: Pt will perform furniture transfers with assist:: Independent with assistive device  ?  ?

## 2022-04-05 NOTE — Progress Notes (Signed)
Inpatient Rehabilitation  Patient information reviewed and entered into eRehab system by Amare Kontos M. Rapheal Masso, M.A., CCC/SLP, PPS Coordinator.  Information including medical coding, functional ability and quality indicators will be reviewed and updated through discharge.    

## 2022-04-06 ENCOUNTER — Inpatient Hospital Stay (HOSPITAL_COMMUNITY): Payer: Medicare Other

## 2022-04-06 DIAGNOSIS — R5381 Other malaise: Secondary | ICD-10-CM | POA: Diagnosis not present

## 2022-04-06 IMAGING — DX DG ANKLE COMPLETE 3+V*R*
1 series · 3 of 3 positions shown · non-contrast
Comparison: None.

CLINICAL DATA: Bilateral ankle pain

EXAM:
RIGHT ANKLE - COMPLETE 3+ VIEW

[Series 1: ankle · 0.14mm/px · 3 of 3 slices shown]
[im 1/3]
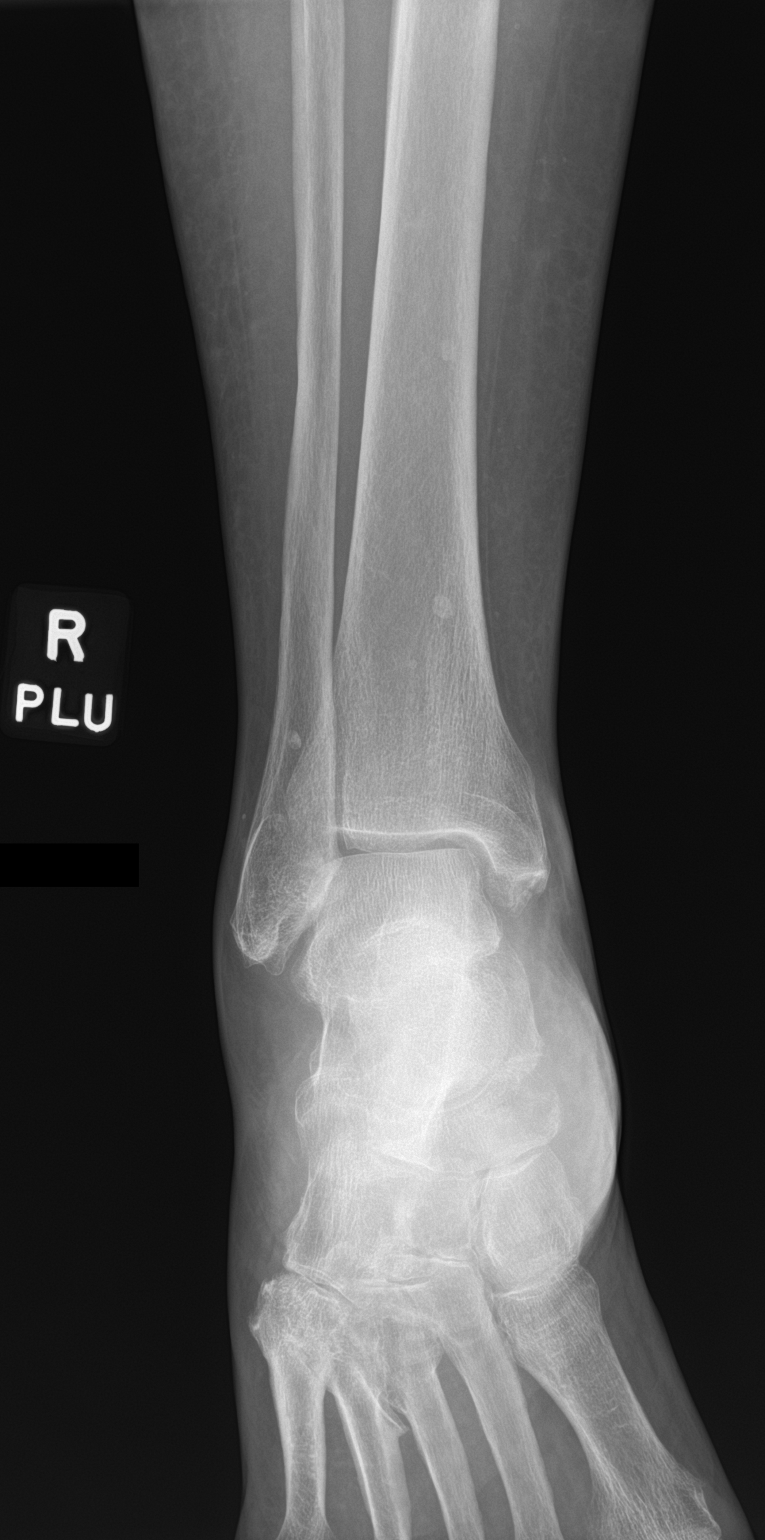
[im 2/3]
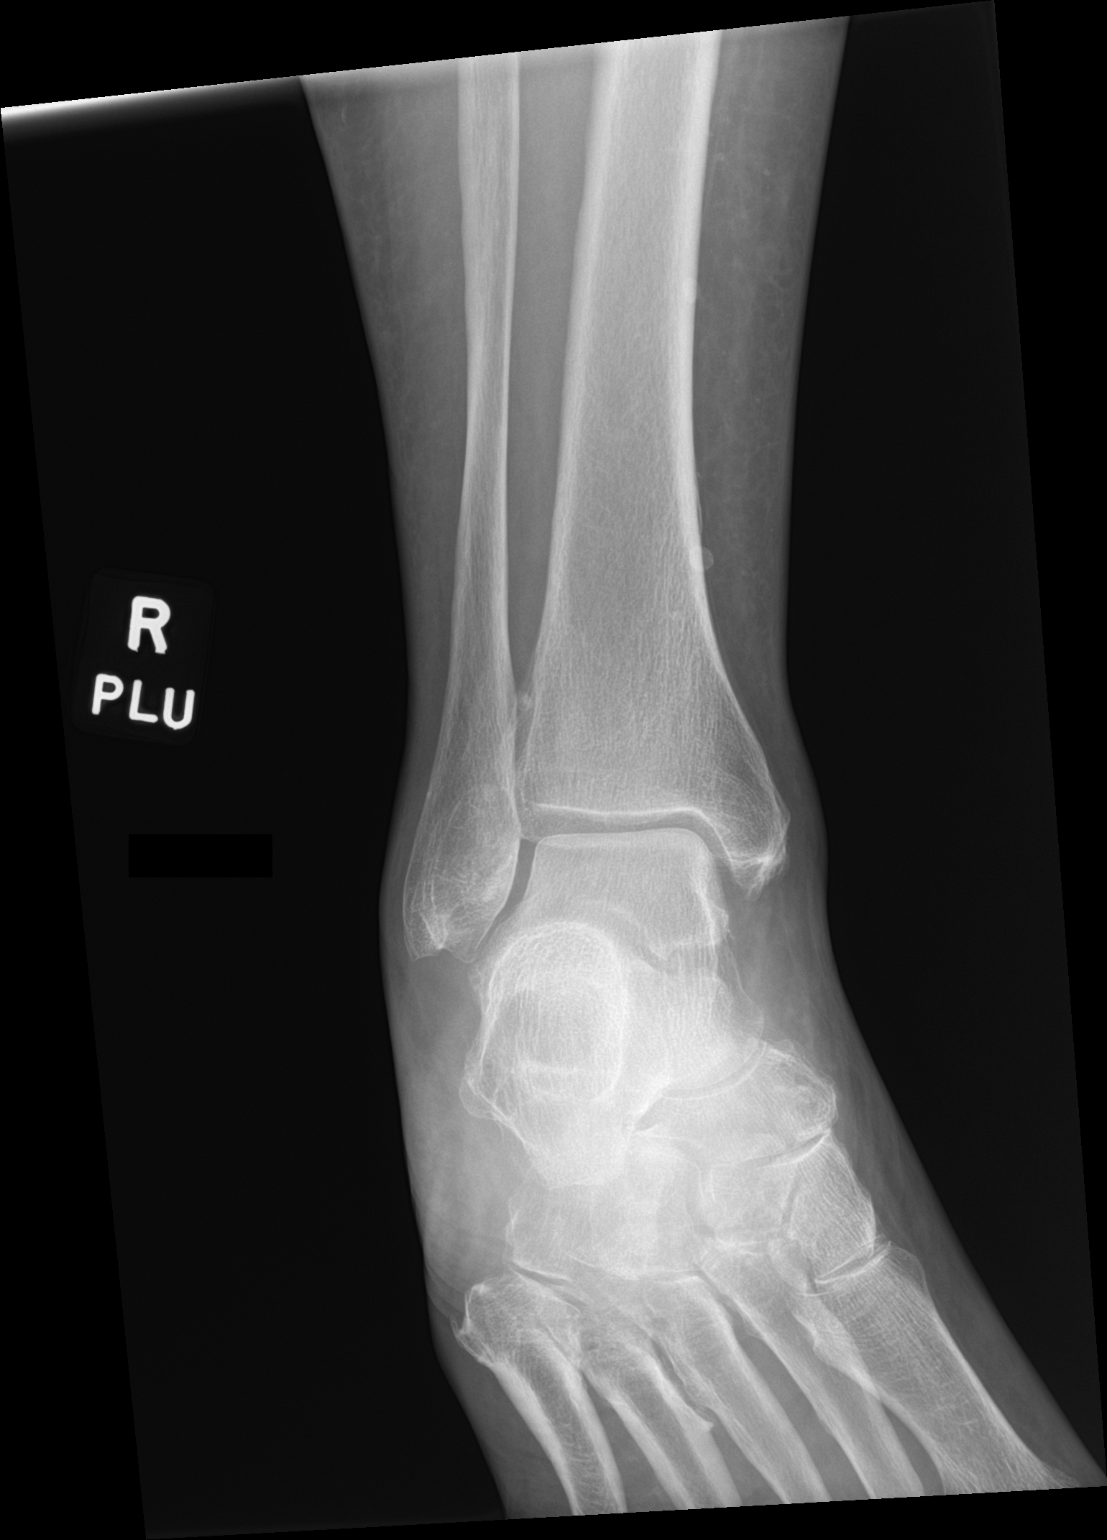
[im 3/3]
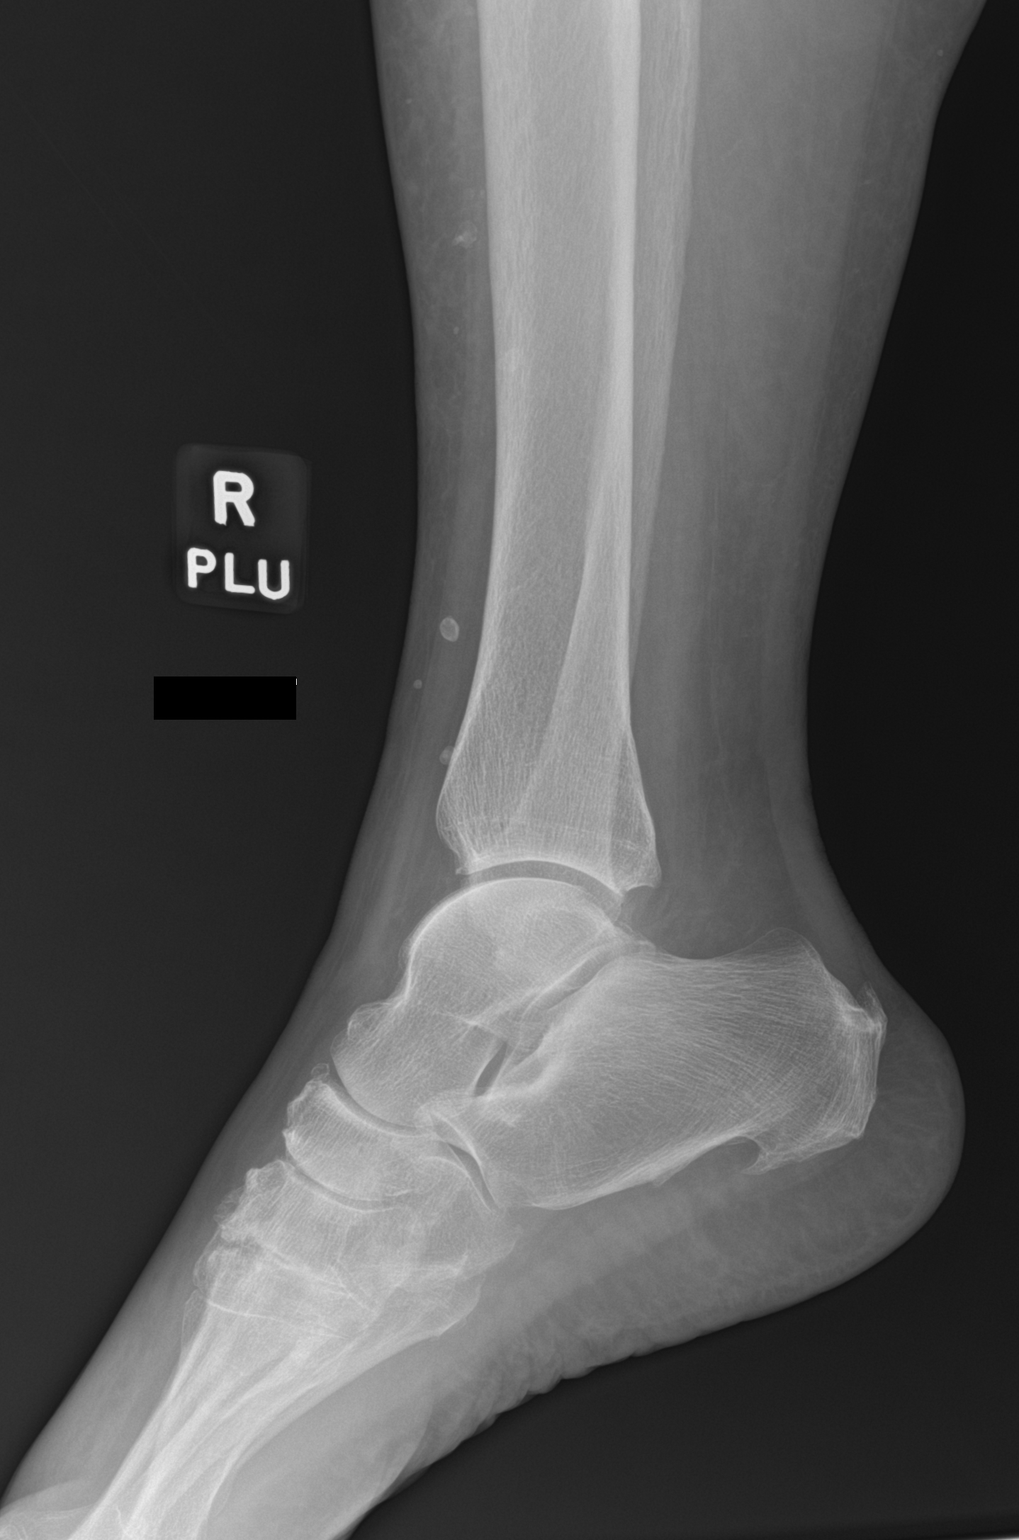

[3 of 3 positions shown; findings below may reference images not displayed]

FINDINGS: No acute bony abnormality. Specifically, no fracture, subluxation,
or dislocation. Plantar and posterior calcaneal spurs. Degenerative
changes in the tarsal region.
IMPRESSION: No acute bony abnormality.

## 2022-04-06 IMAGING — DX DG ANKLE COMPLETE 3+V*L*
1 series · 3 of 3 positions shown · non-contrast
Comparison: None.

CLINICAL DATA: Bilateral ankle pain

EXAM:
LEFT ANKLE COMPLETE - 3+ VIEW

[Series 1: ankle · 0.14mm/px · 3 of 3 slices shown]
[im 1/3]
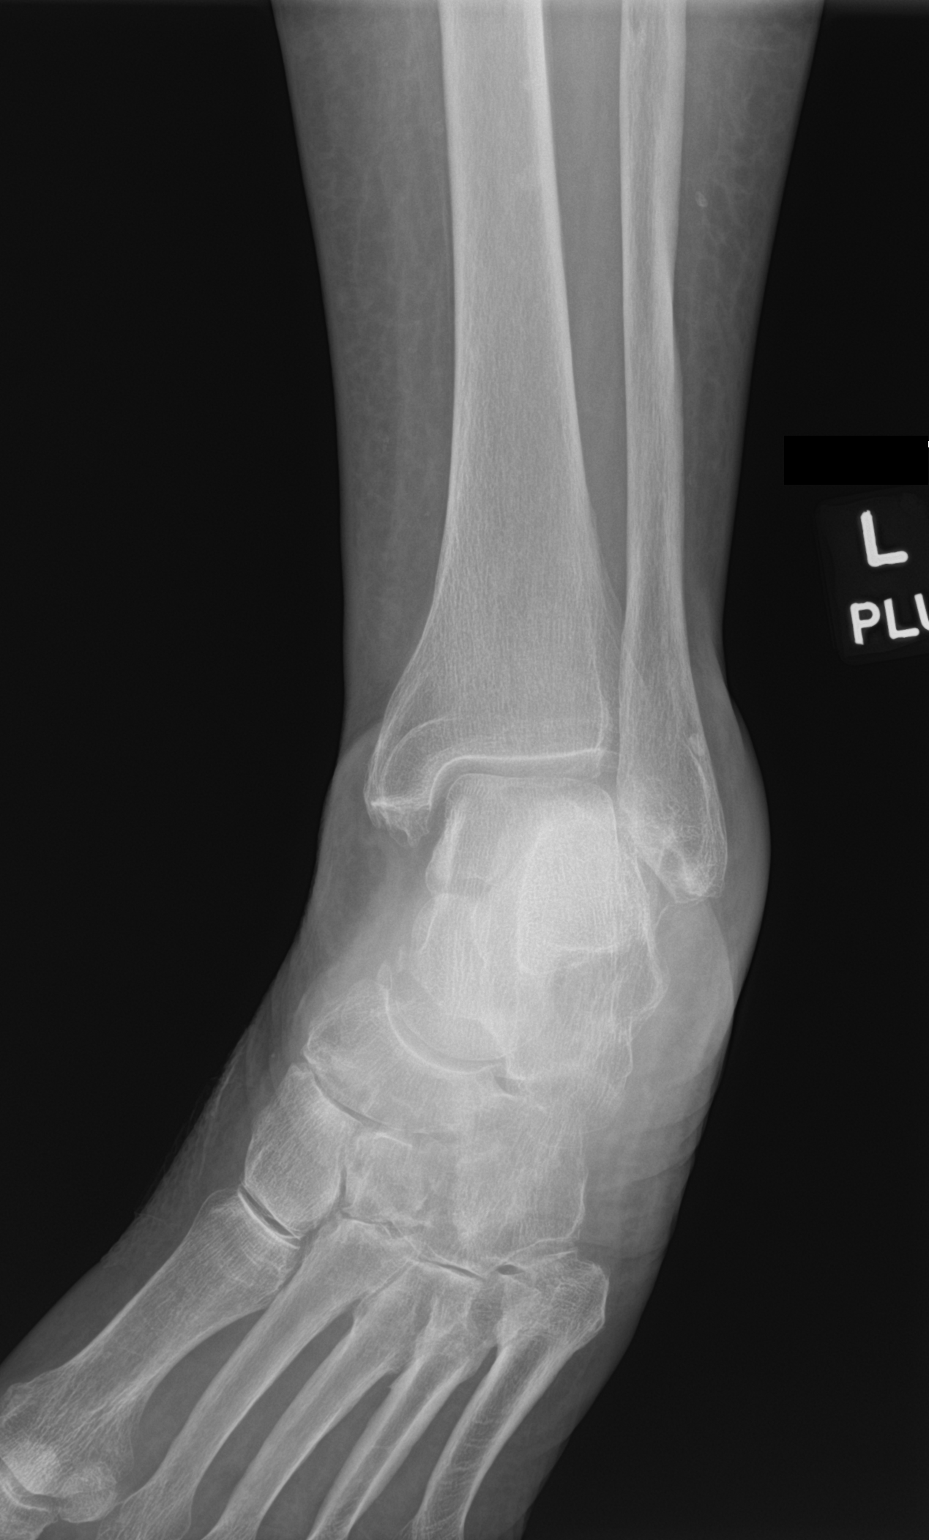
[im 2/3]
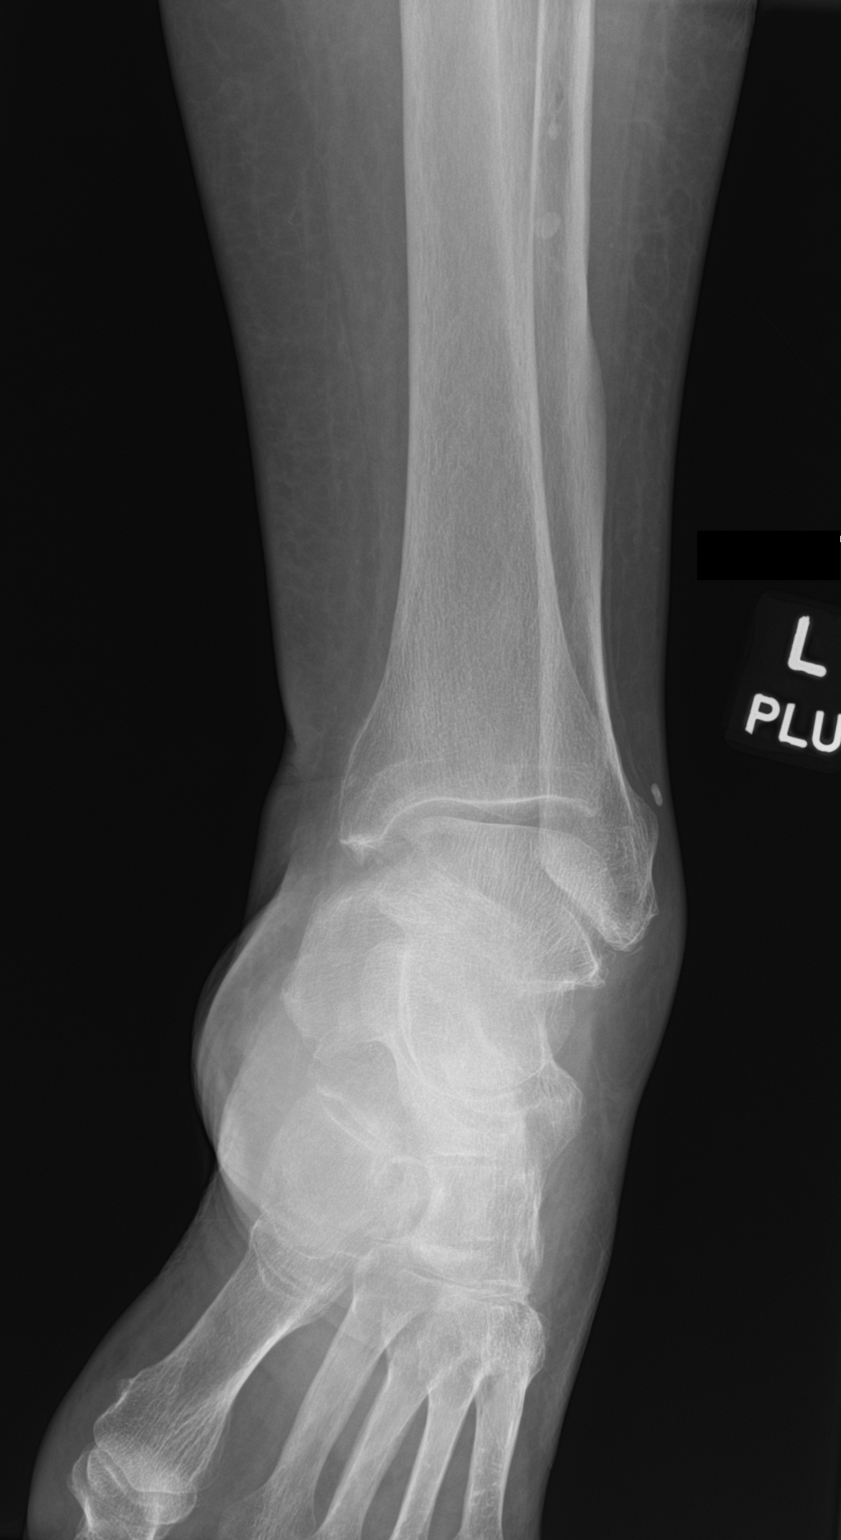
[im 3/3]
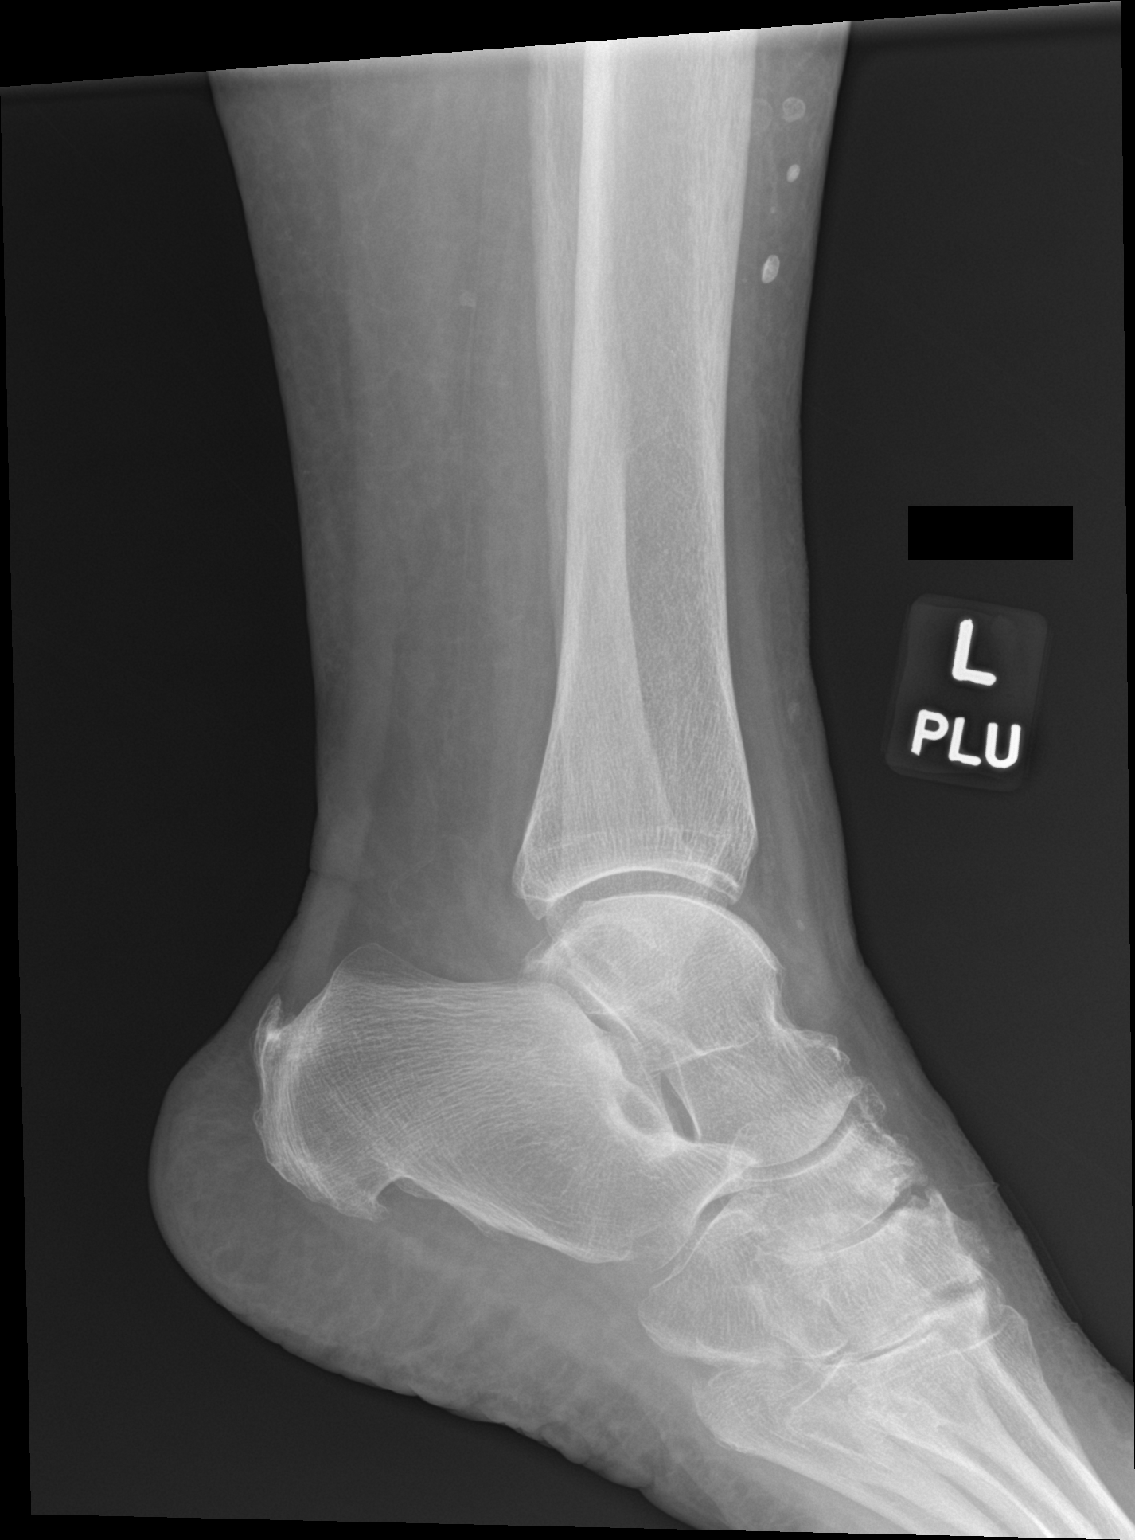

[3 of 3 positions shown; findings below may reference images not displayed]

FINDINGS: No acute bony abnormality. Specifically, no fracture, subluxation,
or dislocation. Plantar and posterior calcaneal spurs. Degenerative
changes in the tarsal region.
IMPRESSION: No acute bony abnormality.

## 2022-04-06 MED ORDER — COLCHICINE 0.6 MG PO TABS
0.6000 mg | ORAL_TABLET | Freq: Once | ORAL | Status: AC
  Administered 2022-04-06: 0.6 mg via ORAL
  Filled 2022-04-06: qty 1

## 2022-04-06 MED ORDER — DULOXETINE HCL 60 MG PO CPEP
60.0000 mg | ORAL_CAPSULE | Freq: Every day | ORAL | Status: DC
Start: 2022-04-07 — End: 2022-04-16
  Administered 2022-04-07 – 2022-04-16 (×10): 60 mg via ORAL
  Filled 2022-04-06 (×10): qty 1

## 2022-04-06 MED ORDER — BACLOFEN 5 MG HALF TABLET
5.0000 mg | ORAL_TABLET | Freq: Two times a day (BID) | ORAL | Status: DC
Start: 2022-04-07 — End: 2022-04-16
  Administered 2022-04-07 – 2022-04-16 (×19): 5 mg via ORAL
  Filled 2022-04-06 (×20): qty 1

## 2022-04-06 MED ORDER — COLCHICINE 0.6 MG PO TABS
1.2000 mg | ORAL_TABLET | Freq: Once | ORAL | Status: AC
  Administered 2022-04-06: 1.2 mg via ORAL
  Filled 2022-04-06: qty 2

## 2022-04-06 MED ORDER — OXYCODONE HCL 5 MG PO TABS
5.0000 mg | ORAL_TABLET | ORAL | Status: DC | PRN
  Administered 2022-04-06 – 2022-04-08 (×8): 5 mg via ORAL
  Filled 2022-04-06 (×8): qty 1

## 2022-04-06 MED ORDER — BISACODYL 10 MG RE SUPP
10.0000 mg | Freq: Every day | RECTAL | Status: DC | PRN
  Administered 2022-04-06: 10 mg via RECTAL
  Filled 2022-04-06 (×2): qty 1

## 2022-04-06 NOTE — Progress Notes (Signed)
Physical Therapy Session Note ? ?Patient Details  ?Name: [name] ?MRN: [id number] ?Date of Birth: 06/17/1946 ? ?Today's Date: 04/06/2022 ?PT Individual Time: 0900-1000 ?PT Individual Time Calculation (min): 60 min  ? ?Short Term Goals: ?Week 1:  PT Short Term Goal 1 (Week 1): Pt will perform supine<>sit with supervision ?PT Short Term Goal 2 (Week 1): Pt will perform sit<>stands using LRAD with CGA ?PT Short Term Goal 3 (Week 1): Pt will perform bed<>chair transfers using LRAD with CGA ?PT Short Term Goal 4 (Week 1): Pt will ambulate at least 171f using LRAD with CGA ?PT Short Term Goal 5 (Week 1): Pt will participate in an outcome measure to assess fall risk ? ?Skilled Therapeutic Interventions/Progress Updates:  ?  pt received in bed and agreeable to therapy. No complaint of pain. Donned ted hose and shoes with mod A, pt pulling up over knees. Donned binder mod a. Pt requested to use bathroom. ambulatory transfer to commode, continent bladder void documented in flow sheet. Min a for balance with clothing management. Pt ambulated to/from day room, 150 ft with CGA and RW. Pt demoes steppage gait to compensate for poor dorsiflexion. Pt performed TUG with RW and CGA a documented below: ?38.8 ?30.0 ?26.9 ?Average:31.9 seconds ? ?Pt then performed Sit to stand x 10 with cues for limiting UE support. Pt did best when pushing from both hands to stand or reaching both hands back. Pt able to stand with CGA but required intermittent light UE support to reach full standing and catch balance.  ? ?While returning to room, pt caught toe and tripped, coming to kneeling on floor. Pt states she felt her self tripping and chose to come to both knees instead of falling. Attempted floor transfer from half kneel, but required +2 assist to come to w/c. No signs of injury noted and pt's nurse aware. Pt remained in w/c at end of session and was left with all needs in reach and alarm active.  ? ?Therapy Documentation ?Precautions:   ?Precautions ?Precautions: Fall, Other (comment) ?Precaution Comments: Watch BP; has TEDs and abdominal binder, limited R shoulder ROM ?Other Brace: Thigh high TEDs and abdominal binder for BP control ?Restrictions ?Weight Bearing Restrictions: Yes ?RUE Weight Bearing: Weight bearing as tolerated ?Other Position/Activity Restrictions: R wrist fx and L 5th finger fx ?General: ?  ? ? ? ? ?Therapy/Group: Individual Therapy ? ?OToombs?04/06/2022, 12:47 PM  ?

## 2022-04-06 NOTE — Progress Notes (Signed)
Patient reported increased pain in bilateral ankles this evening. Increased swelling noted to Lt ankle with ROM limited by pain. MD Lovorn notified. New orders received. PRN oxycodone '5mg'$  given this evening with some relief. Continue to monitor.  ?

## 2022-04-06 NOTE — Progress Notes (Signed)
Pt already on CPAP upon my arrival and tolerating well.   

## 2022-04-06 NOTE — Progress Notes (Signed)
?                                                       PROGRESS NOTE ? ? ?Subjective/Complaints: ? ? 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  ? ?Duloxetine 60 mg BID_ but Cr is high, so will decrease to qday and reduce Baclofen to BID due to renal function.  ? ? ? ?ROS: ? ? ?Pt denies SOB, abd pain, CP, N/V/C/D, and vision changes ?Gout as above-  ? ?Objective: ?  ?No results found. ?Recent Labs  ?  04/04/22 ?0038  ?WBC 5.5  ?HGB 10.1*  ?HCT 31.1*  ?PLT 252  ? ?Recent Labs  ?  04/04/22 ?0038  ?NA 136  ?K 4.6  ?CL 102  ?CO2 26  ?GLUCOSE 100*  ?BUN 44*  ?CREATININE 1.91*  ?CALCIUM 9.2  ? ? ?Intake/Output Summary (Last 24 hours) at 04/06/2022 1434 ?Last data filed at 04/06/2022 1306 ?Gross per 24 hour  ?Intake 420 ml  ?Output 975 ml  ?Net -555 ml  ?  ? ?  ? ?Physical Exam: ?Vital Signs ?Blood pressure (!) 112/56, pulse (!) 108, temperature 98.1 ?F (36.7 ?C), temperature source Oral, resp. rate 16, height '5\' 7"'$  (1.702 m), weight 79.2 kg, SpO2 100 %. ? ? 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   Comments: Patient is alert.  No acute distress and follows commands. Sitting at the edge of bed and breathing comfortably on room air while eating her lunch. No focal deficits ?Extremities; less LE edema- looks better than last admission ? ? ?Assessment/Plan: ?1. Functional deficits which require 3+ hours per day of interdisciplinary therapy in a comprehensive inpatient rehab  setting. ?Physiatrist is providing close team supervision and 24 hour management of active medical problems listed below. ?Physiatrist and rehab team continue to assess barriers to discharge/monitor patient progress toward functional and medical goals ? ?Care Tool: ? ?Bathing ?   ?Body parts bathed by patient: Right arm, Left arm, Chest, Face, Abdomen, Front perineal area, Right upper leg, Left upper leg, Buttocks  ? Body parts bathed by helper: Buttocks, Right lower leg, Left lower leg ?  ?  ?Bathing assist Assist Level: Minimal Assistance - Patient > 75% ?  ?  ?Upper Body Dressing/Undressing ?Upper body dressing   ?What is the patient wearing?: Pull over shirt ?   ?Upper body assist Assist Level: Supervision/Verbal cueing ?   ?Lower Body Dressing/Undressing ?Lower body dressing ? ? ?   ?What is the patient wearing?: Pants, Underwear/pull up ? ?  ? ?Lower body assist Assist for lower body dressing: Moderate Assistance - Patient 50 - 74% ?   ? ?Toileting ?Toileting    ?Toileting assist Assist for toileting: Minimal Assistance - Patient > 75% ?  ?  ?Transfers ?Chair/bed transfer ? ?Transfers assist ?   ? ?Chair/bed  transfer assist level: Minimal Assistance - Patient > 75% ?Chair/bed transfer assistive device: Armrests, Walker ?  ?Locomotion ?Ambulation ? ? ?Ambulation assist ? ?   ? ?Assist level: Minimal Assistance - Patient > 75% ?Assistive device: Walker-rolling ?Max distance: 169f  ? ?Walk 10 feet activity ? ? ?Assist ?   ? ?Assist level: Minimal Assistance - Patient > 75% ?Assistive device: Walker-rolling  ? ?Walk 50 feet activity ? ? ?Assist   ? ?Assist level: Minimal Assistance - Patient > 75% ?Assistive device: Walker-rolling  ? ? ?Walk 150 feet activity ? ? ?Assist   ? ?Assist level: Minimal Assistance - Patient > 75% ?Assistive device: Walker-rolling ?  ? ?Walk 10 feet on uneven surface  ?activity ? ? ?Assist   ? ? ?Assist level: Minimal Assistance - Patient > 75% ?Assistive device: Walker-rolling   ? ?Wheelchair ? ? ? ? ?Assist Is the patient using a wheelchair?: No ?  ?  ? ?  ?   ? ? ?Wheelchair 50 feet with 2 turns activity ? ? ? ?Assist ? ?  ?  ? ? ?   ? ?Wheelchair 150 feet activity  ? ? ? ?Assist ?   ? ? ?   ? ?Blood pressure (!) 112/56, pulse (!) 108, temperature 98.1 ?F (36.7 ?C), temperature source Oral, resp. rate 16, height '5\' 7"'$  (1.702 m), weight 79.2 kg, SpO2 100 %. ? ?Medical Problem List and Plan: ?1. Functional deficits secondary to debility related to CHF exacerbation/pulmonary emboli/gout/history of CVA ?            -patient may shower ?            -ELOS/Goals: 5-7 days ?            -Continue CIR- PT, OT - gout limiting -will work on gout ?2.  Antithrombotics: ?-DVT/anticoagulation:  Pharmaceutical: Other (comment) Eliquis ?            -antiplatelet therapy: N/A ?3. Pain Management/chronic pain syndrome: Baclofen 5 mg 3 times daily, Neurontin 100 mg 3 times daily, hydrocodone as needed ? 4/22- pain controlled- con't regimen ? 4/23- gout pain getting worse again- will reduce Baclofen to 5 mg BID due to renal function ?4. Mood: Cymbalta 60 mg twice daily, melatonin 5 mg nightly ? 4/23- will reduce Duloxetine to 60 mg daily due to renal function ?            -antipsychotic agents: N/A ?5. Neuropsych: This patient is capable of making decisions on her own behalf. ?6. Skin/Wound Care: Routine skin checks ?7. Fluids/Electrolytes/Nutrition: Routine in and outs with follow-up chemistries ?8.  Acute on chronic diastolic congestive heart failure.  Follow-up per heart failure team.  Monitor for any signs of fluid overload.  Continue Demadex 20 mg daily ?9.  Hypotension.  ProAmatine 10 mg 3 times daily.  Monitor with increased mobility ?10.  Gout.  Uric acid 11.1.  Prednisone taper ? 4/23- will give Colchicine 1.2 mg and then 0.6 mg later and then cannot repeat for 2 weeks- no allopurinol until gout flare resolved.  ?11.  Restless leg syndrome.  Continue Requip ?12.  OSA.  Continue CPAP ?13.  CKD stage  III.   ?4/22- will check labs Monday- last Cr better at 1.91 from 2.25-   ?14.  Obesity with history of gastric bypass.  BMI 27.62 ? 4/22- BMI 27.14-  ?15.  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 Hypokalemia ? 4/22- at risk since on Demadex- will recheck Monday.  ?  17. Constipation ? 4/22- LBM 3+ days ago- will give sorbitol cannot push stool out- might need enema? ? 4/23- LBM this afternoon- con't to monitor ?18. L knee wound due to DM ? 4/23- will add xeroform to see if will help wound heal.  ? ?I spent a total of  39  minutes on total care today- >50% coordination of care- due to calling pharmacy about baclofen, colchicine and duloxetine.  ?Also prolonged d/w pt about options for gout.  ?And wound care ? ? ?LOS: ?2 days ?A FACE TO FACE EVALUATION WAS PERFORMED ? ?Jsoeph Podesta ?04/06/2022, 2:34 PM  ? ? ? ?

## 2022-04-07 DIAGNOSIS — K5901 Slow transit constipation: Secondary | ICD-10-CM

## 2022-04-07 DIAGNOSIS — M1039 Gout due to renal impairment, multiple sites: Secondary | ICD-10-CM

## 2022-04-07 DIAGNOSIS — N1831 Chronic kidney disease, stage 3a: Secondary | ICD-10-CM

## 2022-04-07 DIAGNOSIS — J302 Other seasonal allergic rhinitis: Secondary | ICD-10-CM

## 2022-04-07 DIAGNOSIS — R5381 Other malaise: Secondary | ICD-10-CM | POA: Diagnosis not present

## 2022-04-07 LAB — CBC WITH DIFFERENTIAL/PLATELET
Abs Immature Granulocytes: 0.02 10*3/uL (ref 0.00–0.07)
Basophils Absolute: 0.1 10*3/uL (ref 0.0–0.1)
Basophils Relative: 1 %
Eosinophils Absolute: 0.2 10*3/uL (ref 0.0–0.5)
Eosinophils Relative: 3 %
HCT: 31.5 % — ABNORMAL LOW (ref 36.0–46.0)
Hemoglobin: 10 g/dL — ABNORMAL LOW (ref 12.0–15.0)
Immature Granulocytes: 0 %
Lymphocytes Relative: 27 %
Lymphs Abs: 1.5 10*3/uL (ref 0.7–4.0)
MCH: 31.7 pg (ref 26.0–34.0)
MCHC: 31.7 g/dL (ref 30.0–36.0)
MCV: 100 fL (ref 80.0–100.0)
Monocytes Absolute: 0.4 10*3/uL (ref 0.1–1.0)
Monocytes Relative: 8 %
Neutro Abs: 3.3 10*3/uL (ref 1.7–7.7)
Neutrophils Relative %: 61 %
Platelets: 236 10*3/uL (ref 150–400)
RBC: 3.15 MIL/uL — ABNORMAL LOW (ref 3.87–5.11)
RDW: 14 % (ref 11.5–15.5)
WBC: 5.4 10*3/uL (ref 4.0–10.5)
nRBC: 0 % (ref 0.0–0.2)

## 2022-04-07 LAB — COMPREHENSIVE METABOLIC PANEL
ALT: 19 U/L (ref 0–44)
AST: 21 U/L (ref 15–41)
Albumin: 2.7 g/dL — ABNORMAL LOW (ref 3.5–5.0)
Alkaline Phosphatase: 78 U/L (ref 38–126)
Anion gap: 9 (ref 5–15)
BUN: 47 mg/dL — ABNORMAL HIGH (ref 8–23)
CO2: 29 mmol/L (ref 22–32)
Calcium: 8.8 mg/dL — ABNORMAL LOW (ref 8.9–10.3)
Chloride: 102 mmol/L (ref 98–111)
Creatinine, Ser: 1.83 mg/dL — ABNORMAL HIGH (ref 0.44–1.00)
GFR, Estimated: 29 mL/min — ABNORMAL LOW (ref >60)
Glucose, Bld: 85 mg/dL (ref 70–99)
Potassium: 3.6 mmol/L (ref 3.5–5.1)
Sodium: 140 mmol/L (ref 135–145)
Total Bilirubin: 0.7 mg/dL (ref 0.3–1.2)
Total Protein: 5.6 g/dL — ABNORMAL LOW (ref 6.5–8.1)

## 2022-04-07 MED ORDER — FLUTICASONE PROPIONATE 50 MCG/ACT NA SUSP
1.0000 | Freq: Every day | NASAL | Status: DC
  Administered 2022-04-07 – 2022-04-16 (×10): 1 via NASAL
  Filled 2022-04-07: qty 16

## 2022-04-07 MED ORDER — PREDNISONE 5 MG PO TABS
10.0000 mg | ORAL_TABLET | Freq: Every day | ORAL | Status: AC
  Administered 2022-04-07 – 2022-04-08 (×2): 10 mg via ORAL
  Filled 2022-04-07 (×2): qty 2

## 2022-04-07 MED ORDER — GUAIFENESIN 200 MG PO TABS
200.0000 mg | ORAL_TABLET | ORAL | Status: DC | PRN
  Administered 2022-04-07 – 2022-04-08 (×2): 200 mg via ORAL
  Filled 2022-04-07 (×3): qty 1

## 2022-04-07 NOTE — Progress Notes (Addendum)
?                                                       PROGRESS NOTE ? ? ?Subjective/Complaints: ? ?Feels achy in numerous areas. Admits to gout feeling better but hands and ankles still sore and "swollen".  Reports frequent coughing/congestion in throat. Had an episode of sob this morning and later felt a little dizzy when up with therapy.  ? ? ? ?ROS: Patient denies fever, rash, sore throat, blurred vision, dizziness, nausea, vomiting, diarrhea, cough,   chest pain, headache, or mood change.  ? ?Objective: ?  ?DG Ankle Complete Left ? ?Result Date: 04/06/2022 ?CLINICAL DATA:  Bilateral ankle pain EXAM: LEFT ANKLE COMPLETE - 3+ VIEW COMPARISON:  None. FINDINGS: No acute bony abnormality. Specifically, no fracture, subluxation, or dislocation. Plantar and posterior calcaneal spurs. Degenerative changes in the tarsal region. IMPRESSION: No acute bony abnormality. Electronically Signed   By: Rolm Baptise M.D.   On: 04/06/2022 21:13  ? ?DG Ankle Complete Right ? ?Result Date: 04/06/2022 ?CLINICAL DATA:  Bilateral ankle pain EXAM: RIGHT ANKLE - COMPLETE 3+ VIEW COMPARISON:  None. FINDINGS: No acute bony abnormality. Specifically, no fracture, subluxation, or dislocation. Plantar and posterior calcaneal spurs. Degenerative changes in the tarsal region. IMPRESSION: No acute bony abnormality. Electronically Signed   By: Rolm Baptise M.D.   On: 04/06/2022 21:14   ?Recent Labs  ?  04/07/22 ?0612  ?WBC 5.4  ?HGB 10.0*  ?HCT 31.5*  ?PLT 236  ? ?Recent Labs  ?  04/07/22 ?0612  ?NA 140  ?K 3.6  ?CL 102  ?CO2 29  ?GLUCOSE 85  ?BUN 47*  ?CREATININE 1.83*  ?CALCIUM 8.8*  ? ? ?Intake/Output Summary (Last 24 hours) at 04/07/2022 1234 ?Last data filed at 04/07/2022 0800 ?Gross per 24 hour  ?Intake 595 ml  ?Output 825 ml  ?Net -230 ml  ?  ? ?  ? ?Physical Exam: ?Vital Signs ?Blood pressure 106/68, pulse (!) 101, temperature 97.7 ?F (36.5 ?C), temperature source Oral, resp. rate 18, height '5\' 7"'$  (1.702 m), weight 79 kg, SpO2 100  %. ? ? ?Constitutional: No distress . Vital signs reviewed. ?HEENT: NCAT, EOMI, oral membranes moist ?Neck: supple ?Cardiovascular: RRR without murmur. No JVD    ?Respiratory/Chest: CTA Bilaterally without wheezes or rales. Normal effort    ?GI/Abdomen: BS +, non-tender, non-distended ?Ext: no clubbing, cyanosis, trace LE edema ?Psych: pleasant and cooperative  ?MS: mild ankle/hand swelling/effusions noted ?skin: has what appears to be developing tophi on L 3rd digit ?L lateral knee wound C/D/I dry dressing ?Neurological:  ?   Alert and oriented x 3. Normal insight and awareness. Intact Memory. Normal language and speech. Cranial nerve exam unremarkable    ? ? ?Assessment/Plan: ?1. Functional deficits which require 3+ hours per day of interdisciplinary therapy in a comprehensive inpatient rehab setting. ?Physiatrist is providing close team supervision and 24 hour management of active medical problems listed below. ?Physiatrist and rehab team continue to assess barriers to discharge/monitor patient progress toward functional and medical goals ? ?Care Tool: ? ?Bathing ?   ?Body parts bathed by patient: Right arm, Left arm, Chest, Face, Abdomen, Front perineal area, Right upper leg, Left upper leg, Buttocks  ? Body parts bathed by helper: Buttocks, Right lower leg, Left lower leg ?  ?  ?  Bathing assist Assist Level: Minimal Assistance - Patient > 75% ?  ?  ?Upper Body Dressing/Undressing ?Upper body dressing   ?What is the patient wearing?: Pull over shirt ?   ?Upper body assist Assist Level: Set up assist ?   ?Lower Body Dressing/Undressing ?Lower body dressing ? ? ?   ?What is the patient wearing?: Underwear/pull up, Pants ? ?  ? ?Lower body assist Assist for lower body dressing: Moderate Assistance - Patient 50 - 74% ?   ? ?Toileting ?Toileting    ?Toileting assist Assist for toileting: Minimal Assistance - Patient > 75% ?  ?  ?Transfers ?Chair/bed transfer ? ?Transfers assist ?   ? ?Chair/bed transfer assist level:  Contact Guard/Touching assist ?Chair/bed transfer assistive device: Armrests, Walker ?  ?Locomotion ?Ambulation ? ? ?Ambulation assist ? ?   ? ?Assist level: Contact Guard/Touching assist ?Assistive device: Walker-rolling ?Max distance: 150  ? ?Walk 10 feet activity ? ? ?Assist ?   ? ?Assist level: Contact Guard/Touching assist ?Assistive device: Walker-rolling  ? ?Walk 50 feet activity ? ? ?Assist   ? ?Assist level: Contact Guard/Touching assist ?Assistive device: Walker-rolling  ? ? ?Walk 150 feet activity ? ? ?Assist   ? ?Assist level: Contact Guard/Touching assist ?Assistive device: Walker-rolling ?  ? ?Walk 10 feet on uneven surface  ?activity ? ? ?Assist   ? ? ?Assist level: Minimal Assistance - Patient > 75% ?Assistive device: Walker-rolling  ? ?Wheelchair ? ? ? ? ?Assist Is the patient using a wheelchair?: No ?  ?  ? ?  ?   ? ? ?Wheelchair 50 feet with 2 turns activity ? ? ? ?Assist ? ?  ?  ? ? ?   ? ?Wheelchair 150 feet activity  ? ? ? ?Assist ?   ? ? ?   ? ?Blood pressure 106/68, pulse (!) 101, temperature 97.7 ?F (36.5 ?C), temperature source Oral, resp. rate 18, height '5\' 7"'$  (1.702 m), weight 79 kg, SpO2 100 %. ? ?Medical Problem List and Plan: ?1. Functional deficits secondary to debility related to CHF exacerbation/pulmonary emboli/gout/history of CVA ?            -patient may shower ?            -ELOS/Goals: 5-7 days ?            -Continue CIR therapies including PT, OT  ?2.  Antithrombotics: ?-DVT/anticoagulation:  Pharmaceutical: Other (comment) Eliquis ?            -antiplatelet therapy: N/A ?3. Pain Management/chronic pain syndrome: Baclofen 5 mg 3 times daily, Neurontin 100 mg 3 times daily, hydrocodone as needed ? 4/22- pain controlled- con't regimen ? 4/23- gout pain getting worse again- will reduce Baclofen to 5 mg BID due to renal function ?4. Mood: Cymbalta 60 mg twice daily, melatonin 5 mg nightly ? 4/23- will reduce Duloxetine to 60 mg daily due to renal function ?             -antipsychotic agents: N/A ?5. Neuropsych: This patient is capable of making decisions on her own behalf. ?6. Skin/Wound Care: Routine skin checks ?7. Fluids/Electrolytes/Nutrition: Routine in and outs with follow-up chemistries ?8.  Acute on chronic diastolic congestive heart failure.  Follow-up per heart failure team.  Monitor for any signs of fluid overload.  Continue Demadex 20 mg daily ?  ?Filed Weights  ? 04/05/22 0433 04/06/22 0303 04/07/22 0521  ?Weight: 78.6 kg 79.2 kg 79 kg  ?  ?9.  Hypotension.  ProAmatine 10 mg 3 times daily.  Monitor with increased mobility ?10.  Gout.  Uric acid 11.1.  Prednisone taper ? 4/23- will give Colchicine 1.2 mg and then 0.6 mg later and then cannot repeat for 2 weeks- no allopurinol until gout flare resolved ?4/24 pain still present today albeit slightly improved, still affecting mobility.  ?-give prednisone '10mg'$  x 2 ?11.  Restless leg syndrome.  Continue Requip ?12.  OSA.  Continue CPAP ?13.  CKD stage III.   ?4/24 further improvement of Cr to 1.83 although BUN sl elevated ?-continue to monitor ?-encourage appropriate fluids  ?14.  Obesity with history of gastric bypass.  BMI 27.62 ? 4/22- BMI 27.14-  ?15.  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 Hypokalemia ? 4/24 at risk since on Demadex- 3.6 today  ?17. Constipation ? 4/22- LBM 3+ days ago- will give sorbitol cannot push stool out- might need enema? ? 4/24- LBM this morning ?18. L knee wound due to DM ? 4/23- will added xeroform to see if will help wound heal.  ?19. Congestion/cough: chest clear, weights stable ? -?allergies, post-nasal drip ? -flonase, cough drops ordered ?   ? ?  ? ? ?LOS: ?3 days ?A FACE TO FACE EVALUATION WAS PERFORMED ? ?Meredith Staggers ?04/07/2022, 12:34 PM  ? ? ? ?

## 2022-04-07 NOTE — Progress Notes (Signed)
Physical Therapy Session Note ? ?Patient Details  ?Name: [name] ?MRN: [id number] ?Date of Birth: August 01, 1946 ? ?Today's Date: 04/07/2022 ?PT Individual Time: 6384-6659 and 1445-1530 ?PT Individual Time Calculation (min): 72 min and 45 min ? ?Short Term Goals: ?Week 1:  PT Short Term Goal 1 (Week 1): Pt will perform supine<>sit with supervision ?PT Short Term Goal 2 (Week 1): Pt will perform sit<>stands using LRAD with CGA ?PT Short Term Goal 3 (Week 1): Pt will perform bed<>chair transfers using LRAD with CGA ?PT Short Term Goal 4 (Week 1): Pt will ambulate at least 125f using LRAD with CGA ?PT Short Term Goal 5 (Week 1): Pt will participate in an outcome measure to assess fall risk ? ?Skilled Therapeutic Interventions/Progress Updates:  ? ?First session:  Pt presents sitting in recliner and agreeable to therapy, handed off from OT.  Pt wheeled to dayroom for energy conservation.  Pt amb x 15' to Nu-step w/ RW and CGA.  Pt performed Nu-step at Level 4 x 10', rest of 1' and then 5' w/o c/o for increased strength and endurance.  Pt amb x 150' multiple trials w/ seated rest breaks between trials 2/2 fatigue and BP monitoring.  Pt began session at 113/56 in sitting and after completion of Nu-step BP at 108/43 w/o c/o symptoms.  Pt asks to readjust abd binder and returned to room.  Pt stood from w/c w/ CGA and then able to pull pants using alternating hands while standing at RW w/ CGA, PT doffed and then lowered binder for comfort.  Pt returned to gym for continued gait training.  Pt amb back to room at conclusion of session and sat on EOB.  W/c placed to left and pt performed modified SPT to w/c w/ min A.  Chair alarm placed and all needs in reach.  Ice bags re-applied to B ankles, stating pain increased to 8/10 B ankles. ? ?Second session:  Pt presents sitting in w/c and agreeable to therapy.  PT brought washed and dried clothes from laundry and left on table.  Pt wheeled forward and transferred sit to stand w/ CGA  to RW and performed folding of clothing including turning clothes right-side out w/o UE support.  Pt w/ occasional LOB posteriorly w/ mod A to regain and verbal cues for hips forward.  Pt required 2 sitting rest breaks during activity.  Pt amb up to 100' w/ RW and CGA including turns to return to seat.  Pt requested remaining in w/c to visit w/ friend.  Chair alarm on and all needs in reach. ?   ? ?Therapy Documentation ?Precautions:  ?Precautions ?Precautions: Fall, Other (comment) ?Precaution Comments: Watch BP; has TEDs and abdominal binder, limited R shoulder ROM ?Other Brace: Thigh high TEDs and abdominal binder for BP control ?Restrictions ?Weight Bearing Restrictions: No ?RUE Weight Bearing: Weight bearing as tolerated ?Other Position/Activity Restrictions: R wrist fx and L 5th finger fx ?General: ?  ?Vital Signs: ?  ?Pain:7/10 B ankles initially, increased to 8/10 w/ activity. ?Pain Assessment ?Pain Scale: 0-10 ?Pain Score: 8  ?Pain Type: Chronic pain ?Pain Location: Ankle ?Pain Orientation: Right;Left ?Pain Descriptors / Indicators: Aching;Sore ?Pain Onset: On-going ?Pain Intervention(s): Medication (See eMAR) ?  ? ? ? ?Therapy/Group: Individual Therapy ? ?[name]?04/07/2022, 10:24 AM  ?

## 2022-04-07 NOTE — Progress Notes (Signed)
Inpatient Rehabilitation Care Coordinator ?Assessment and Plan ?Patient Details  ?Name: [name] ?MRN: [id number] ?Date of Birth: 09/01/1946 ? ?Today's Date: 04/07/2022 ? ?Hospital Problems: Principal Problem: ?  Debility ? ?Past Medical History:  ?Past Medical History:  ?Diagnosis Date  ? Anemia   ? unable to absorb iron after gastric bypass  ? Arthritis   ? generalized  ? Asthma   ? Atrophic vaginitis   ? Back pain   ? DDD/stenosis  ? Carotid stenosis   ? Carotid US 10/16: Plaque RICA (3-26%), normal LICA  ? Depression   ? takes Cymbalta daily  ? Diverticulosis   ? benign  ? DJD (degenerative joint disease)   ? Dyslipidemia   ? Dysrhythmia   ? Family history of GI bleeding   ? GERD (gastroesophageal reflux disease)   ? takes Nexium daily  ? Gestational diabetes   ? Pt denies  ? H/O hiatal hernia   ? surgery for hernia  ? Headache(784.0)   ? takes Imitrex daily as needed and Bisoprolol daily;last migraine was about 2wks ago  ? History of bronchitis 1 yr ago  ? History of shingles   ? Insomnia   ? takes Ambien nightly  ? Joint pain   ? Joint swelling   ? Leg cramps   ? takes Flexeril daily as needed  ? 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 Nocturia   ? Osteoporosis   ? Peripheral neuropathy   ? takes Gabapentin daily  ? Pneumonia 38yr ago  ? hx of  ? PONV (postoperative nausea and vomiting)   ? Restless leg syndrome   ? takes Requip daily  ? RLS (restless legs syndrome) 08/16/2015  ? Sleep apnea   ? uses CPAP  ? Stroke ([facility]   ? Tubular adenoma of colon   ? Vertigo   ? Vitamin D deficiency   ? ?Past Surgical History:  ?Past Surgical History:  ?Procedure Laterality Date  ? COLONOSCOPY    ? EP IMPLANTABLE DEVICE N/A 11/19/2016  ? Procedure: Loop Recorder Insertion;  Surgeon: SDeboraha Sprang MD;  [facility];  Service: Cardiovascular;  Laterality: N/A;  ? ESOPHAGOGASTRODUODENOSCOPY    ? excess skin removal    ? post weight loss  ? GASTRIC BYPASS  2001  ? HERNIA REPAIR    ? umbilical  ? JOINT REPLACEMENT  Right   ? knee  ? REVERSE SHOULDER ARTHROPLASTY Right 04/25/2021  ? Procedure: REVERSE SHOULDER ARTHROPLASTY carpal tunel realase right wrist;  Surgeon: CTania Ade MD;  Location: [facility];  Service: Orthopedics;  Laterality: Right;  ? RIGHT HEART CATH N/A 02/06/2022  ? Procedure: RIGHT HEART CATH;  Surgeon: SBelva Crome MD;  [facility];  Service: Cardiovascular;  Laterality: N/A;  ? skin reduction  2003 2004  ? stomach and arm  ? STEROID INJECTION TO SCAR    ? x 2 in back   ? TOTAL KNEE ARTHROPLASTY Left 04/03/2015  ? Procedure: LEFT TOTAL KNEE ARTHROPLASTY;  Surgeon: PMelrose Nakayama MD;  [facility]  Service: Orthopedics;  Laterality: Left;  ? WISDOM TOOTH EXTRACTION    ? ?Social History:  reports that she has never smoked. She has never used smokeless tobacco. She reports current alcohol use. She reports that she does not use drugs. ? ?Family / Support Systems ?Marital Status: Married ?How Long?: 63 years ?Patient Roles: Spouse ?Spouse/Significant Other: Jospeh (husband) ?Children: THree adult children: [place]. Pt reports Caryl Pina drives up some weekends to help if needed. ?Other Supports: grandchildren live locally and can help PRN ?Anticipated Caregiver: Husband ?Ability/Limitations of Caregiver: Husband is only caregiver and only have to provide supervision ?Caregiver Availability: 24/7 ?Family Dynamics: Pt lives with her husband ? 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 ?Abuse/Neglect ?Abuse/Neglect Assessment Can Be Completed: Yes ?Physical Abuse: Denies ?Verbal Abuse: Denies ?Sexual Abuse: Denies ?Exploitation of patient/patient's resources: Denies ?Self-Neglect: Denies ? ?Patient response to: ?Social Isolation - How often do you feel lonely or isolated from those around you?: Never ? ?Emotional Status ?Pt's affect, behavior and adjustment status: Pt in good spirits at time of visit. Pt is concerned about leaving too soon since she does not have enough strength in her legs, and her husband is not able to lift her up. ?Recent Psychosocial Issues: Denies ?Psychiatric History: Denies ?Substance Abuse History: Pt admits to an occassional glass of wine. ? ?Patient / Family Perceptions, Expectations & Goals ?Pt/Family understanding of illness & functional limitations: Pt and husband have a general understanding of care needs ?Premorbid pt/family roles/activities: SNF placement at [facility] ?Anticipated changes in roles/activities/participation: Assistance with ADLs/IADLs ?Pt/family expectations/goals: Pt goal is to work on building up strength in legs, arms and shoulders since very weak, and balance and walking. ? ?[employer] ?Community Agencies: None ?Premorbid Home Care/DME Agencies: Other (Comment) ([facility] ?Transportation available at discharge: Husband ?Is the patient able to respond to transportation needs?: Yes ?In the past 12 months, has lack of transportation kept you from medical appointments or from getting medications?: No ?In the past 12 months, has lack of transportation kept you from meetings, work, or from getting things needed for daily living?: No ?Resource referrals recommended: Neuropsychology ? ?Discharge Planning ?Living Arrangements: Spouse/significant other ?Support Systems: Spouse/significant other ?Type of Residence: Private residence ?Insurance Resources: Medicare ?Financial Resources: Social  Security ?Financial Screen Referred: No ?Living Expenses: Own ?Money Management: Spouse ?Does the patient have any problems obtaining your medications?: No ?Home Management: Pt reports she manaed all household duties, including meal prep. ?Patient/Family Preliminary Plans: TBD ?Care Coordinator Barriers to Discharge: Decreased caregiver support, Lack of/limited family support ?Care Coordinator Anticipated Follow Up Needs: HH/OP ?Expected length of stay: 6-9 days ? ?Clinical Impression ?SW met with pt in room to introduce self, explain role, and discuss discharge process. Pt is not a English as a second language teacher. Pt husband served in [employer]. No HCPOA. DME: rollator, w/c, and shower seat.  ? ?71- SW spoke with pt husband to introduce self, explain role, discuss discharge process, and ELOS. SW discussed family education. Pt husband initially reluctant to come in. SW shared the importance of family edu in seeing gains wife has made in rehab, what she needs help with, and if caring for pt wife is managable at home. Fam edu scheduled for Wed (4/26) 1pm-4pm. Here is aware SW will f/u after team conference tomorrow.  ? ?Rana Snare ?04/07/2022, 1:50 PM ? ?  ?

## 2022-04-07 NOTE — Care Management (Signed)
Inpatient Rehabilitation Center ?Individual Statement of Services ? ?Patient Name:  [name]  ?Date:  04/07/2022 ? ?Welcome to the [facility].  Our goal is to provide you with an individualized program based on your diagnosis and situation, designed to meet your specific needs.  With this comprehensive rehabilitation program, you will be expected to participate in at least 3 hours of rehabilitation therapies Monday-Friday, with modified therapy programming on the weekends. ? ?Your rehabilitation program will include the following services:  Physical Therapy (PT), Occupational Therapy (OT), 24 hour per day rehabilitation nursing, Therapeutic Recreaction (TR), Psychology, Neuropsychology, Care Coordinator, Rehabilitation Medicine, Nutrition Services, Pharmacy Services, and Other ? ?Weekly team conferences will be held on Tuesdays to discuss your progress.  Your Inpatient Rehabilitation Care Coordinator will talk with you frequently to get your input and to update you on team discussions.  Team conferences with you and your family in attendance may also be held. ? ?Expected length of stay: 7-10 days   ? ?Overall anticipated outcome: Independent with assistive device ? ?Depending on your progress and recovery, your program may change. Your Inpatient Rehabilitation Care Coordinator will coordinate services and will keep you informed of any changes. Your Inpatient Rehabilitation Care Coordinator's name and contact numbers are listed  below. ? ?The following services may also be recommended but are not provided by the [facility]:  ?Driving Evaluations ?Home Health Rehabiltiation Services ?Outpatient Rehabilitation Services ?Vocational Rehabilitation ?  ?Arrangements will be made to provide these services after discharge if needed.  Arrangements include referral to agencies that provide these services. ? ?Your insurance has been verified to be:  Medicare A/B ? ?Your primary  doctor is:  [name] ? ?Pertinent information will be shared with your doctor and your insurance company. ? ?Inpatient Rehabilitation Care Coordinator:  Cathleen Corti [phone] or (C) [phone] ? ?Information discussed with and copy given to patient by: Rana Snare, 04/07/2022, 9:50 AM    ?

## 2022-04-07 NOTE — Progress Notes (Signed)
Patient was placed on CPAP via [facility] auto max 15cmH2O min 6 cmH2O. Patient tolerating at this time. ?

## 2022-04-07 NOTE — IPOC Note (Signed)
Overall Plan of Care (IPOC) ?Patient Details ?Name: [name] ?MRN: [id number] ?DOB: 01/10/1946 ? ?Admitting Diagnosis: Debility ? ?Hospital Problems: Principal Problem: ?  Debility ? ? ? ? Functional Problem List: ?Nursing Edema, Endurance, Medication Management, Motor, Pain, Safety, Skin Integrity  ?PT Balance, Nutrition, Skin Integrity, Behavior, Pain, Edema, Perception, Endurance, Safety, Motor, Sensory  ?OT Balance, Cognition, Endurance, Motor, Sensory, Vision  ?SLP    ?TR    ?    ? Basic ADL?s: ?OT Grooming, Bathing, Dressing, Toileting  ? ?  Advanced  ADL?s: ?OT Simple Meal Preparation, Light Housekeeping  ?   ?Transfers: ?PT Bed Mobility, Bed to Chair, Car, Furniture  ?OT Toilet, Tub/Shower  ? ?  Locomotion: ?PT Ambulation, Wheelchair Mobility, Stairs  ? ?  Additional Impairments: ?OT None  ?SLP   ?  ?   ?TR    ? ? ?Anticipated Outcomes ?Item Anticipated Outcome  ?Self Feeding set-up A  ?Swallowing ?   ?  ?Basic self-care ? S to mod I  ?Toileting ? S to mod I ?  ?Bathroom Transfers mod I  ?Bowel/Bladder ? n/a  ?Transfers ? supervision using LRAD  ?Locomotion ? supervision using LRAD  ?Communication ?    ?Cognition ?    ?Pain ? < 3  ?Safety/Judgment ? supervision  ? ?Therapy Plan: ?PT Intensity: Minimum of 1-2 x/day ,45 to 90 minutes ?PT Frequency: 5 out of 7 days ?PT Duration Estimated Length of Stay: 1-1.5 weeks ?OT Intensity: Minimum of 1-2 x/day, 45 to 90 minutes ?OT Frequency: 5 out of 7 days ?OT Duration/Estimated Length of Stay: 7-10 days ?   ? ?Due to the current state of emergency, patients may not be receiving their 3-hours of Medicare-mandated therapy. ? ? Team Interventions: ?Nursing Interventions Patient/Family Education, Disease Management/Prevention, Pain Management, Medication Management, Skin Care/Wound Management, Discharge Planning  ?PT interventions Ambulation/gait training, Cognitive remediation/compensation, Discharge planning, DME/adaptive equipment instruction, Functional mobility  training, Pain management, Psychosocial support, Therapeutic Activities, Splinting/orthotics, UE/LE Strength taining/ROM, Visual/perceptual remediation/compensation, Wheelchair propulsion/positioning, UE/LE Coordination activities, Therapeutic Exercise, Stair training, Skin care/wound management, Patient/family education, Neuromuscular re-education, Functional electrical stimulation, Disease management/prevention, Academic librarian, Training and development officer  ?OT Interventions Balance/vestibular training, Discharge planning, Pain management, Self Care/advanced ADL retraining, Therapeutic Activities, UE/LE Coordination activities, Visual/perceptual remediation/compensation, Therapeutic Exercise, Skin care/wound managment, Cognitive remediation/compensation, Disease mangement/prevention, Functional mobility training, Patient/family education, Community reintegration, DME/adaptive equipment instruction, Psychosocial support, UE/LE Strength taining/ROM, Wheelchair propulsion/positioning, Splinting/orthotics, Neuromuscular re-education  ?SLP Interventions    ?TR Interventions    ?SW/CM Interventions Discharge Planning, Psychosocial Support, Patient/Family Education  ? ?Barriers to Discharge ?MD  Medical stability  ?Nursing Decreased caregiver support, Home environment access/layout, Lack of/limited family support, Wound Care ?1 level, level entry. Spouse can only provide supervision.  ?PT Wound Care, Weight ?   ?OT Inaccessible home environment, Decreased caregiver support ?   ?SLP   ?   ?SW   ?   ? ?Team Discharge Planning: ?Destination: PT-Home ,OT- Home , SLP-  ?Projected Follow-up: PT-Outpatient PT, OT-  Outpatient OT, SLP-  ?Projected Equipment Needs: PT-To be determined, OT- To be determined, SLP-  ?Equipment Details: PT- , OT-  ?Patient/family involved in discharge planning: PT- Patient,  OT-Patient, SLP-  ? ?MD ELOS: 8-10 days ?Medical Rehab Prognosis:  Excellent ?Assessment: The patient has been admitted  for CIR therapies with the diagnosis of debility due to multiple medical issues. The team will be addressing functional mobility, strength, stamina, balance, safety, adaptive techniques and equipment, self-care, bowel and bladder mgt, patient and  caregiver education, pain mgt, orthostasis mgt, wound care, exercise tolerance, community reentry. Goals have been set at supervision to mod I with basic mobility and self-care. Anticipated discharge destination is home. ? ?Due to the current state of emergency, patients may not be receiving their 3 hours per day of Medicare-mandated therapy.  ? ? ?Meredith Staggers, MD, FAAPMR   ? ? ?See Team Conference Notes for weekly updates to the plan of care  ?

## 2022-04-07 NOTE — Progress Notes (Signed)
**  Late Entry** ? ? 04/06/22 1526  ?What Happened  ?Was fall witnessed? Yes  ?Who witnessed fall? Olivia (Therapy)  ?Patients activity before fall during therapy  ?Point of contact other (comment) ?(knees)  ?Was patient injured? No  ?Follow Up  ?MD notified Lovorn, MD  ?Time MD notified 1520  ?Family notified Yes - comment  ?Additional tests No  ?Simple treatment Ice  ?Progress note created (see row info) Yes  ?Adult Fall Risk Assessment  ?Risk Factor Category (scoring not indicated) Fall has occurred during this admission (document High fall risk)  ?Patient Fall Risk Level High fall risk  ?Adult Fall Risk Interventions  ?Required Bundle Interventions *See Row Information* High fall risk - low, moderate, and high requirements implemented  ?Additional Interventions Use of appropriate toileting equipment (bedpan, BSC, etc.)  ?Screening for Fall Injury Risk (To be completed on HIGH fall risk patients) - Assessing Need for Floor Mats  ?Risk For Fall Injury- Criteria for Floor Mats Previous fall this admission  ?Will Implement Floor Mats Yes  ?2nd Pain Site  ?Pain Score 5  ?Pain Type Chronic pain  ?Pain Location Leg  ?Pain Orientation Left;Right  ?Pain Descriptors / Indicators Aching  ?Pain Intervention(s) Medication (See eMAR);Cold applied  ?Neurological  ?Neuro (WDL) X  ?Level of Consciousness Alert  ?Orientation Level Oriented X4  ?Cognition Appropriate at baseline  ?Speech Clear  ?Facial Symmetry Symmetrical  ?R Hand Grip Moderate  ?L Hand Grip Moderate  ?RUE Motor Response Purposeful movement  ?LUE Motor Response Purposeful movement  ?RLE Motor Response Purposeful movement  ?LLE Motor Response Purposeful movement  ?Musculoskeletal  ?Musculoskeletal (WDL) X  ?Assistive Device Wheelchair;Front wheel walker  ?Generalized Weakness Yes  ?Musculoskeletal Details  ?RUE Limited movement  ?RLE Full movement;Weakness  ?LLE Full movement;Weakness  ?Integumentary  ?Integumentary (WDL) X  ?Skin Color Appropriate for ethnicity   ?Skin Condition Dry  ?Skin Integrity Ecchymosis  ?Abrasion Location Arm  ?Abrasion Location Orientation Bilateral  ?Abrasion Intervention Foam  ?Skin Turgor Non-tenting  ? ? ?

## 2022-04-07 NOTE — Progress Notes (Signed)
Occupational Therapy Session Note ? ?Patient Details  ?Name: [name] ?MRN: [id number] ?Date of Birth: [birth year] ? ?Today's Date: 04/07/2022 ?OT Individual Time: [phone] ?OT Individual Time Calculation (min): 62 min  ? ? ?Short Term Goals: ?Week 1:  OT Short Term Goal 1 (Week 1): STG = LTG 2/2 ELOS ? ?Skilled Therapeutic Interventions/Progress Updates:  ?  Patient received in reclined position in bed with ice packs on each ankle.  Patient reports pain in ankles, and with moderate swelling in posterior/lateral left ankle. Patient eager to get out of bed to use bathroom, and shower - although slightly apprehensive about standing on feet.   ?Patient ambulated to bathroom with rolling walker and min assist.  Commode and tub bench transfer with min assist.  Patient became short of breath and light headed at end of shower.  O2 checked at 100% pulse 101.  Patient allowed to rest x 2 min - until symptoms resolved.   ?Assisted patient with TED hose, and tension in abdominal binder.  Patient assisted into shoes as LE's swollen, but PT immediately following OT.   ?Patient needed cueing for sufficient forward flexion for all transitions to standing.  Patient stood at sink to brush teeth, without physical assistance.   ? ?Therapy Documentation ?Precautions:  ?Precautions ?Precautions: Fall, Other (comment) ?Precaution Comments: Watch BP; has TEDs and abdominal binder, limited R shoulder ROM ?Other Brace: Thigh high TEDs and abdominal binder for BP control ?Restrictions ?Weight Bearing Restrictions: No ?RUE Weight Bearing: Weight bearing as tolerated ?Other Position/Activity Restrictions: R wrist fx and L 5th finger fx ?  ?Vital Signs: ?Therapy Vitals ?Pulse Rate: (!) 101 ?Oxygen Therapy ?SpO2: 100 % ?O2 Device: Room Air ?Patient Activity (if Appropriate): In chair ?Pulse Oximetry Type: Intermittent ?Pain: ?Pain Assessment ?Pain Scale: 0-10 ?Pain Score: 8  ?Pain Type: Chronic pain ?Pain Location: Ankle ?Pain Orientation:  Right;Left ?Pain Descriptors / Indicators: Aching;Sore ?Pain Onset: On-going ?Pain Intervention(s): Medication (See eMAR) ? ? ? ? ?Therapy/Group: Individual Therapy ? ?Mariah Milling ?04/07/2022, 11:51 AM ?

## 2022-04-08 DIAGNOSIS — R5381 Other malaise: Secondary | ICD-10-CM | POA: Diagnosis not present

## 2022-04-08 MED ORDER — MENTHOL 3 MG MT LOZG
1.0000 | LOZENGE | OROMUCOSAL | Status: DC | PRN
  Administered 2022-04-08 – 2022-04-13 (×6): 3 mg via ORAL
  Filled 2022-04-08 (×4): qty 9

## 2022-04-08 MED ORDER — POLYETHYLENE GLYCOL 3350 17 G PO PACK
17.0000 g | PACK | Freq: Every day | ORAL | Status: DC
  Administered 2022-04-08: 17 g via ORAL
  Filled 2022-04-08: qty 1

## 2022-04-08 MED ORDER — LEVETIRACETAM 250 MG PO TABS
250.0000 mg | ORAL_TABLET | Freq: Two times a day (BID) | ORAL | Status: DC
  Administered 2022-04-08 – 2022-04-16 (×17): 250 mg via ORAL
  Filled 2022-04-08 (×17): qty 1

## 2022-04-08 MED ORDER — HYDROCODONE-ACETAMINOPHEN 7.5-325 MG PO TABS
2.0000 | ORAL_TABLET | Freq: Four times a day (QID) | ORAL | Status: DC | PRN
  Administered 2022-04-08 – 2022-04-16 (×24): 2 via ORAL
  Filled 2022-04-08 (×24): qty 2

## 2022-04-08 MED ORDER — NAPHAZOLINE-GLYCERIN 0.012-0.25 % OP SOLN
1.0000 [drp] | Freq: Four times a day (QID) | OPHTHALMIC | Status: DC | PRN
  Administered 2022-04-08 – 2022-04-13 (×4): 2 [drp] via OPHTHALMIC
  Filled 2022-04-08: qty 15

## 2022-04-08 MED ORDER — POLYETHYLENE GLYCOL 3350 17 G PO PACK
17.0000 g | PACK | Freq: Two times a day (BID) | ORAL | Status: DC
  Administered 2022-04-08 – 2022-04-15 (×14): 17 g via ORAL
  Filled 2022-04-08 (×16): qty 1

## 2022-04-08 NOTE — Progress Notes (Signed)
Occupational Therapy Session Note ? ?Patient Details  ?Name: Poppi Scantling Couser ?MRN: [id number] ?Date of Birth: Mar 15, 1946 ? ?Today's Date: 04/08/2022 ?OT Individual Time: 0800-0900 ?OT Individual Time Calculation (min): 60 min  ? ? ?Short Term Goals: ?Week 1:  OT Short Term Goal 1 (Week 1): STG = LTG 2/2 ELOS ? ?Skilled Therapeutic Interventions/Progress Updates:  ?  Pt seated EOB upon arrival with nursing present for med pass. Pt assisted with donning Ted hose, shoes, and abdominal binder. Stand pivot transfer to w/c with CGA. No c/o dizziness. Pt transitioned to day room and practiced sit<>stand and stand pivot with CGA. NuStep level 4 for 10 mins. Pt required mod A for squat pivot transfer to w/c following NuStep. Pt unable to push up for stand pivot tranfser. Pt returned to room and remained in w/c with belt alarm activated and all needs within reach.  ? ?Therapy Documentation ?Precautions:  ?Precautions ?Precautions: Fall, Other (comment) ?Precaution Comments: Watch BP; has TEDs and abdominal binder, limited R shoulder ROM ?Other Brace: Thigh high TEDs and abdominal binder for BP control ?Restrictions ?Weight Bearing Restrictions: No ?RUE Weight Bearing: Weight bearing as tolerated ?Other Position/Activity Restrictions: R wrist fx and L 5th finger fx ?Pain: ?Pain Assessment ?Pain Scale: 0-10 ?Pain Score: 8  ?Pain Type: Chronic pain ?Pain Location: Generalized ?Pain Descriptors / Indicators: Aching;Burning ?Pain Frequency: Constant ?Pain Onset: On-going ?Pain Intervention(s): meds admin at beginning of session ? ? ?Therapy/Group: Individual Therapy ? ?Leroy Libman ?04/08/2022, 9:06 AM ?

## 2022-04-08 NOTE — Progress Notes (Signed)
Orthopedic Tech Progress Note ?Patient Details:  ?[name] ?[birth year] ?[id number] ? ?Ortho Devices ?Type of Ortho Device: Abdominal binder ?Ortho Device/Splint Location: STOMACH ?Ortho Device/Splint Interventions: Ordered ?  ?Post Interventions ?Patient Tolerated: Well ?Instructions Provided: Care of device ? ?[name] ?04/08/2022, 3:03 PM ? ?

## 2022-04-08 NOTE — Progress Notes (Signed)
?                                                       PROGRESS NOTE ? ? ?Subjective/Complaints: ? ?Feels dizzy/lightheaded due to dehydration per pt- due to fluid restrictions.  ?Still constipated- wants miralax added.  ?Wants pain meds changed back to Norco 15 mg q6 hours.  ? ?Asking about something for tremor- wants ot try Keppra.  ?Also wants dietician to educate her on diet for gout/CHF.  ?Spent some time educating on salt intake and fluid restriction- which I changed to 1800cc.  ? ?Also feels L eye is scratchy- like something in it.  ? ? ?ROS:  ?Pt denies SOB, abd pain, CP, N/V/C/D, and vision changes ?.  ? ?Objective: ?  ?DG Ankle Complete Left ? ?Result Date: 04/06/2022 ?CLINICAL DATA:  Bilateral ankle pain EXAM: LEFT ANKLE COMPLETE - 3+ VIEW COMPARISON:  None. FINDINGS: No acute bony abnormality. Specifically, no fracture, subluxation, or dislocation. Plantar and posterior calcaneal spurs. Degenerative changes in the tarsal region. IMPRESSION: No acute bony abnormality. Electronically Signed   By: Rolm Baptise M.D.   On: 04/06/2022 21:13  ? ?DG Ankle Complete Right ? ?Result Date: 04/06/2022 ?CLINICAL DATA:  Bilateral ankle pain EXAM: RIGHT ANKLE - COMPLETE 3+ VIEW COMPARISON:  None. FINDINGS: No acute bony abnormality. Specifically, no fracture, subluxation, or dislocation. Plantar and posterior calcaneal spurs. Degenerative changes in the tarsal region. IMPRESSION: No acute bony abnormality. Electronically Signed   By: Rolm Baptise M.D.   On: 04/06/2022 21:14   ?Recent Labs  ?  04/07/22 ?0612  ?WBC 5.4  ?HGB 10.0*  ?HCT 31.5*  ?PLT 236  ? ?Recent Labs  ?  04/07/22 ?0612  ?NA 140  ?K 3.6  ?CL 102  ?CO2 29  ?GLUCOSE 85  ?BUN 47*  ?CREATININE 1.83*  ?CALCIUM 8.8*  ? ? ?Intake/Output Summary (Last 24 hours) at 04/08/2022 0845 ?Last data filed at 04/07/2022 1738 ?Gross per 24 hour  ?Intake 480 ml  ?Output --  ?Net 480 ml  ?  ? ?  ? ?Physical Exam: ?Vital Signs ?Blood pressure 119/64, pulse (!) 101, temperature  98 ?F (36.7 ?C), resp. rate 18, height '5\' 7"'$  (1.702 m), weight 81.8 kg, SpO2 100 %. ? ? ? ?General: awake, alert, appropriate,sitting EOB; c/o L eye scratchiness; NAD ?HENT: conjugate gaze; oropharynx moist- blinking more on L eye- slightly erythematous sclera ?CV: regular/borderline tachycardic rate; no JVD ?Pulmonary: CTA B/L; no W/R/R- good air movement ?GI: soft, NT, ND, (+)BS- hypoactive ?Psychiatric: appropriate ?Neurological: Ox3 ?Extremities:- trace LE edema ?Psych: pleasant and cooperative  ?MS: mild ankle/hand swelling/effusions noted ?skin: has what appears to be developing tophi on L 3rd digit ?L lateral knee wound C/D/I dry dressing ?Neurological:  ?   Alert and oriented x 3. Normal insight and awareness. Intact Memory. Normal language and speech. Cranial nerve exam unremarkable    ? ? ?Assessment/Plan: ?1. Functional deficits which require 3+ hours per day of interdisciplinary therapy in a comprehensive inpatient rehab setting. ?Physiatrist is providing close team supervision and 24 hour management of active medical problems listed below. ?Physiatrist and rehab team continue to assess barriers to discharge/monitor patient progress toward functional and medical goals ? ?Care Tool: ? ?Bathing ?   ?Body parts bathed by patient: Right arm, Left arm, Chest, Face, Abdomen,  Front perineal area, Right upper leg, Left upper leg, Buttocks  ? Body parts bathed by helper: Buttocks, Right lower leg, Left lower leg ?  ?  ?Bathing assist Assist Level: Minimal Assistance - Patient > 75% ?  ?  ?Upper Body Dressing/Undressing ?Upper body dressing   ?What is the patient wearing?: Pull over shirt ?   ?Upper body assist Assist Level: Set up assist ?   ?Lower Body Dressing/Undressing ?Lower body dressing ? ? ?   ?What is the patient wearing?: Underwear/pull up, Pants ? ?  ? ?Lower body assist Assist for lower body dressing: Moderate Assistance - Patient 50 - 74% ?   ? ?Toileting ?Toileting    ?Toileting assist Assist for  toileting: Minimal Assistance - Patient > 75% ?  ?  ?Transfers ?Chair/bed transfer ? ?Transfers assist ?   ? ?Chair/bed transfer assist level: Contact Guard/Touching assist ?Chair/bed transfer assistive device: Armrests, Walker ?  ?Locomotion ?Ambulation ? ? ?Ambulation assist ? ?   ? ?Assist level: Contact Guard/Touching assist ?Assistive device: Walker-rolling ?Max distance: 150  ? ?Walk 10 feet activity ? ? ?Assist ?   ? ?Assist level: Contact Guard/Touching assist ?Assistive device: Walker-rolling  ? ?Walk 50 feet activity ? ? ?Assist   ? ?Assist level: Contact Guard/Touching assist ?Assistive device: Walker-rolling  ? ? ?Walk 150 feet activity ? ? ?Assist   ? ?Assist level: Contact Guard/Touching assist ?Assistive device: Walker-rolling ?  ? ?Walk 10 feet on uneven surface  ?activity ? ? ?Assist   ? ? ?Assist level: Minimal Assistance - Patient > 75% ?Assistive device: Walker-rolling  ? ?Wheelchair ? ? ? ? ?Assist Is the patient using a wheelchair?: No ?  ?  ? ?  ?   ? ? ?Wheelchair 50 feet with 2 turns activity ? ? ? ?Assist ? ?  ?  ? ? ?   ? ?Wheelchair 150 feet activity  ? ? ? ?Assist ?   ? ? ?   ? ?Blood pressure 119/64, pulse (!) 101, temperature 98 ?F (36.7 ?C), resp. rate 18, height '5\' 7"'$  (1.702 m), weight 81.8 kg, SpO2 100 %. ? ?Medical Problem List and Plan: ?1. Functional deficits secondary to debility related to CHF exacerbation/pulmonary emboli/gout/history of CVA ?            -patient may shower ?            -ELOS/Goals: 5-7 days ?            -Continue CIR- PT, OT  ?Team conference today to determine length of stay- wants to "stay as long as allowed".  ?2.  Antithrombotics: ?-DVT/anticoagulation:  Pharmaceutical: Other (comment) Eliquis ?            -antiplatelet therapy: N/A ?3. Pain Management/chronic pain syndrome: Baclofen 5 mg 3 times daily, Neurontin 100 mg 3 times daily, hydrocodone as needed ? 4/22- pain controlled- con't regimen ? 4/23- gout pain getting worse again- will reduce Baclofen  to 5 mg BID due to renal function ? 4/25- changed Norco to Oxy, but didn't work- will change back to Norco 15 mg q6 hours prn ?4. Mood: Cymbalta 60 mg twice daily, melatonin 5 mg nightly ? 4/23- will reduce Duloxetine to 60 mg daily due to renal function ?            -antipsychotic agents: N/A ?5. Neuropsych: This patient is capable of making decisions on her own behalf. ?6. Skin/Wound Care: Routine skin checks ?7. Fluids/Electrolytes/Nutrition: Routine in and outs with  follow-up chemistries ?8.  Acute on chronic diastolic congestive heart failure.  Follow-up per heart failure team.  Monitor for any signs of fluid overload.  Continue Demadex 20 mg daily ?  ?Filed Weights  ? 04/06/22 0303 04/07/22 0521 04/08/22 0500  ?Weight: 79.2 kg 79 kg 81.8 kg  ?  4/25- Weight up almost 3 kg in 1 day- will check if correct and follow trend- will change fluid restriction to 1800cc due to (+) tenting on exam ?9.  Hypotension.  ProAmatine 10 mg 3 times daily.  Monitor with increased mobility ?10.  Gout.  Uric acid 11.1.  Prednisone taper ? 4/23- will give Colchicine 1.2 mg and then 0.6 mg later and then cannot repeat for 2 weeks- no allopurinol until gout flare resolved ?4/24 pain still present today albeit slightly improved, still affecting mobility.  ?-give prednisone '10mg'$  x 2 ? 4/25- will get dietician consult for pt education on foods cannot eat.  ?11.  Restless leg syndrome.  Continue Requip ?12.  OSA.  Continue CPAP ?13.  CKD stage III.   ?4/24 further improvement of Cr to 1.83 although BUN sl elevated ?-continue to monitor ?-encourage appropriate fluids  ?14.  Obesity with history of gastric bypass.  BMI 27.62 ? 4/22- BMI 27.14-  ?15.  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 Hypokalemia ? 4/24 at risk since on Demadex- 3.6 today  ?17. Constipation ? 4/22- LBM 3+ days ago- will give sorbitol cannot push stool out- might need enema? ? 4/24- LBM this morning ? 4/25- add miralax daily per pt  request ?18. L knee wound due to DM ? 4/23- will added xeroform to see if will help wound heal.  ?19. Congestion/cough: chest clear, weights stable ? -?allergies, post-nasal drip ? -flonase, cough drops ordered ?2

## 2022-04-08 NOTE — Progress Notes (Signed)
Orthopedic Tech Progress Note ?Patient Details:  ?[name] ?Aug 15, 1946 ?[id number] ? ?Called in order to [facility] for BLE AFO CONSULTS  ? ?Patient ID: [name], female   DOB: 07-Dec-1946, 76 y.o.   MRN: [id number] ? ?[name] ?04/08/2022, 11:56 AM ? ?

## 2022-04-08 NOTE — Progress Notes (Signed)
Patient ID: [name], female   DOB: 10/07/1946, 75 y.o.   MRN: [id number] ? ?SW met with pt in room to provide updates from team conference and d/c date 5/3. Pt reports her husband is coming in for family edu tomorrow. Pt is aware SW will f/u with her husband.  ? ?1446- SW spoke with pt husband Joseph to provide above updates. He asked for sitter list since he is aware he will need to hire assistance at discharge. Confirms he will be here tomorrow for fam edu 1pm-4pm.  ? ? , MSW, LCSWA ?Office: [phone] ?Cell: [phone] ?Fax: [phone]  ?

## 2022-04-08 NOTE — Progress Notes (Signed)
Physical Therapy Session Note ? ?Patient Details  ?Name: [name] ?MRN: [id number] ?Date of Birth: May 08, 1946 ? ?Today's Date: 04/08/2022 ?PT Individual Time: 1000-1055; 8469-6295 ?PT Therapy Time: 55 min; 90 min  ? ?Short Term Goals: ?Week 1:  PT Short Term Goal 1 (Week 1): Pt will perform supine<>sit with supervision ?PT Short Term Goal 2 (Week 1): Pt will perform sit<>stands using LRAD with CGA ?PT Short Term Goal 3 (Week 1): Pt will perform bed<>chair transfers using LRAD with CGA ?PT Short Term Goal 4 (Week 1): Pt will ambulate at least 168f using LRAD with CGA ?PT Short Term Goal 5 (Week 1): Pt will participate in an outcome measure to assess fall risk ? ? ?Skilled Therapeutic Interventions/Progress Updates:  ?Session 1: ?Pt received sitting in wheelchair and agreeable to therapy; TEDs and abdominal binder donned. Pt reports pain 9/10 (premedicated) in her ankles. BP assessed at start of session in sitting and standing d/t precautions for orthostatic HT. Initial drop in BP upon standing, but BP stabilized after 2 min; pt reports no sx, values below: ?- 119/66 (sitting) ?- 93/62 (standing) ?- 111/58 (after 2 min of standing) ? ?Pt c/o fatigue from earlier therapy sessions this AM and expressed concerns about bilat ankles rolling when WB or attempting to ambulate. Pt was educated on the potential benefit of orthotics d/t her neuropathy and LE weakness; PT explained that an AFO assessment can be arranged to address deficits. Pt transported to therapy gym for energy conservation. Stand-pivot transfer with RW CGA from w/c <> mat table, with min verbal cuing to back up to mat and reach back with 1UE to control descent. Performed 5 STS (not timed) from mat table with RW CGA-minA with occasional verbal cuing for anterior weight shift and sequencing hand placement, inconsistent performance throughout session; pt reports that STS are easier when she does not think about it, but other times has difficulty d/t LE's  feeling weak. While in sitting, pt expressed concerns about her R shoulder weakness (limited AROM, full PROM) d/t previous RC repair. Pt requested to attempt/incorporate more use of RUE during this session. Standing dynamic balance activity initiated with RW CGA, reaching for pegs outside BOS with BUE's and placing them in pegboard on elevated desk; incorporated reaching across midline with RUE and verbal cuing to bend at her hips in order to reach further outside BOS. Demonstrated difficulty reaching RUE above 75deg. Pt ambulated x12 ft with RW CGA from mat table > checkerboard table; increased time and decreased step length. Progressed to standing with RW in front of slanted magnetic checkerboard and performed sliding each checker as high as possible to promote R shoulder AROM, while having to side-step in RW CGA to get centered in front of each checker and maintain balance. PT student facilitated AAROM (flexion) to the R shoulder intermittently d/t pt-reported pain from muscle soreness. Pt was pleased with multi-tasking activity and was very motivated to continue these challenges in future sessions. Pt transported back to room in w/c for time management. Pt left sitting in w/c with safety alarm active and all needs in reach.  ? ? ?Session 2: ?Pt received standing EOB, handed off from nursing tech with abd binder and TEDs donned. Pt agreeable to therapy and does not report pain. STS from w/c with RW CGA. Gait training initiated to/from therapy gym and during brief AFO assessment for 2 trials x1588f 4 trials x7558fto observe AFOs). ? ?- 1st trial: without AFOs; excessive hip/knee flexion bilat in order  to achieve foot clearance, symmetrical step length, required verbal cuing to stand up tall in order to offload pressure on her UE's ?- Last 5 trials: bilat posterior leaf AFOs with lateral support donned; overall improved foot clearance bilat and initial contact, pt reports increased feeling of ankle  stability. ? ?Standing dynamic balance activity initiated in // bars, progression of: ?- side-stepping with BUE support CGA (verbal cuing to keep toes pointed anteriorly) ?- side-stepping to retrieve and toss horseshoes with BUE support CGA (verbal cuing to not lean on the // bar when standing to toss. [employer] reaching outside BOS to both sides and turning head to reach for horseshoes behind her. Attempted to reach for and toss horseshoe without UE support on // bars, required minA and demonstrated mod LOB posteriorly so d/c and returned to 1UE support on bar. ? ?5xSTS performed (1 min 10 sec) with RW and BUE support from armrests; demonstrating increased time to sit back down d/t eccentric control deficits; tendency to "plop" esp when fatigued but pt is aware and attempts to correct without cuing. Performed a 2nd trial of 5xSTS (untimed) with same deficits, but improved with each attempt.  ? ?Pt reported some pain in her L 5th digit d/t rubbing from the AFO at end of session. With patient seated on EOB, AFOs and TEDs were doffed. Skin showed mild redness along dorsal and lateral surface of L 5th digit. PT explained that this will be reduced once a formal fitting/assessment for AFOs can be performed. Pt was minA for sitting > lying in bed to assist with RLE. Pt left in bed with HOB elevated, 3 ice packs (B ankles and R forearm), safety alarm active, and all needs in reach. ? ?Therapy Documentation ?Precautions:  ?Precautions ?Precautions: Fall, Other (comment) ?Precaution Comments: Watch BP; has TEDs and abdominal binder, limited R shoulder ROM ?Other Brace: Thigh high TEDs and abdominal binder for BP control ?Restrictions ?Weight Bearing Restrictions: No ?RUE Weight Bearing: Weight bearing as tolerated ?Other Position/Activity Restrictions: R wrist fx and L 5th finger fx ? ? ? ?Therapy/Group: Individual Therapy ? ?Lanetta Inch ?04/08/2022, 3:47 PM  ?

## 2022-04-08 NOTE — Progress Notes (Signed)
Occupational Therapy Session Note ? ?Patient Details  ?Name: [name] ?MRN: [id number] ?Date of Birth: Aug 17, 1946 ? ?Today's Date: 04/08/2022 ?OT Individual Time: 8099-8338 ?OT Individual Time Calculation (min): 30 min  ? ? ?Short Term Goals: ?Week 1:  OT Short Term Goal 1 (Week 1): STG = LTG 2/2 ELOS ? ?Skilled Therapeutic Interventions/Progress Updates:  ?Pt greeted  seated in w/c at sink attempting to put ice in ice packs. Session focus on functional therapeutic activity of loading ice in ice packs with an emphasis on [facility] tasks such as wrist flexion/extension, wrist supination/pronation, elbow flexion/extension, digit flexion/extension and in hand manipulation skills to promote independence with ADL participation. Pt using RUE as stabilizer and LUE to complete precision tasks. Pt needed MOD verbal/visual/ demonstration cues to carryover technique.  ?Pt completed stand pivot back to EOB with Rw and MINA. Applied ice to pts bilateral ankles and RUE. Pt left seated EOB with NT present setting pt up for lunch.  ?BP from EOB with TEDS and ABD binder donned, 131/57 ( 79) SpO2 100% on RA HR 99 bpm.   ? ?Therapy Documentation ?Precautions:  ?Precautions ?Precautions: Fall, Other (comment) ?Precaution Comments: Watch BP; has TEDs and abdominal binder, limited R shoulder ROM ?Other Brace: Thigh high TEDs and abdominal binder for BP control ?Restrictions ?Weight Bearing Restrictions: No ?RUE Weight Bearing: Weight bearing as tolerated ?Other Position/Activity Restrictions: R wrist fx and L 5th finger fx ? ?Pain: unrated pain reported in RUE, rest breaks provided as needed with pt also instructed to decrease AROM for pain mgmt.  ? ?Therapy/Group: Individual Therapy ? ?Precious Haws ?04/08/2022, 1:23 PM ?

## 2022-04-08 NOTE — Patient Care Conference (Addendum)
Inpatient RehabilitationTeam Conference and Plan of Care Update ?Date: 04/08/2022   Time: 11:00 AM  ? ? ?Patient Name: [name]      ?Medical Record Number: [id number]  ?Date of Birth: [birth year] ?Sex: Female         ?Room/Bed: 4W05C/4W05C-01 ?Payor Info: Payor: MEDICARE / Plan: MEDICARE PART A AND B / Product Type: *No Product type* /   ? ?Admit Date/Time:  04/04/2022 12:51 PM ? ?Primary Diagnosis:  Debility ? ?Hospital Problems: Principal Problem: ?  Debility ? ? ? ?Expected Discharge Date: Expected Discharge Date: 04/16/22 ? ?Team Members Present: ?Physician leading conference: Dr. Courtney Heys ?Social Worker Present: Loralee Pacas, LCSWA ?Nurse Present: Dorthula Nettles, RN ?PT Present: Excell Seltzer, PT ?OT Present: Roanna Epley, Claris Gladden, OT ?PPS Coordinator present : Gunnar Fusi, SLP ? ?   Current Status/Progress Goal Weekly Team Focus  ?Bowel/Bladder ? ? Continent B/B LBM 4/24  Remain continent  Assess q shift and PRN   ?Swallow/Nutrition/ Hydration ? ?           ?ADL's ? ? bathing-min A; LB dressing-mod A; toileting-min A; transfers-min A  mod I overall  transfers, BADL retraining, activity tolerance, family educaiotn   ?Mobility ? ? min A 150 ft RW, min A transfers RW  Supervision overall  balance, LE strengthening, gait and AFO assessment   ?Communication ? ?           ?Safety/Cognition/ Behavioral Observations ?           ?Pain ? ? 8/10 neuropathy to legs/feet constant. Patient also reporting pain in hands and wrists. Using oxy q4h PRN  Pain<5 out of 10  Assess pain q shift and treat PRN   ?Skin ? ? [place] to BLE resolved except for Lt lateral calf-small area still opened red, non pressure wound to top of left foot. Dressing changes in orders. BUE brusing and skin tears with foams intact  No new breakdown. No signs/symptoms of infection  Assess skin q shift and PRN   ? ? ?Discharge Planning:  ?D/c to home with pt husband who can only provide supervision care to pt. PRN support from  grandchildren and children.   ?Team Discussion: ?Dizzy/lightheaded related to dehydration. Changed fluid restriction orders. Eyedrops ordered, nursing to use often. Two doses of Prednisone ordered for Gout. Weight fluctuates, need standing weight. Has essential tremors. Requesting new abdominal binder due to current riding up. Had assisted fall on Sunday. Continent B/B, reported 8/10 pain to feet and ankles. Treating all skin tears appropriately. Family education skilled for tomorrow with husband. Can't return to SNF. ? ?Patient on target to meet rehab goals: ?yes, supervision goals. Currently min assist overall. Ambulating 150 ft. Requesting bilateral AFO assessment. Min assist with bathing and dressing. Min assist transfers. ? ?*See Care Plan and progress notes for long and short-term goals.  ? ?Revisions to Treatment Plan:  ?Adjusting medications. ?  ?Teaching Needs: ?Family education, medication/pain management, skin/wound care, transfer/gait training, etc. ?  ?Current Barriers to Discharge: ?Decreased caregiver support, Home enviroment access/layout, Wound care, Lack of/limited family support, and Weight ? ?Possible Resolutions to Barriers: ?Family education ?Outpatient therapy ?  ? ? Medical Summary ?Current Status: gout, continent B/B; 9/10 feet/ankles- pseudogout as well; Fall Sunday- skin tears; constipated- started miralax- L eye scratch- started drops; dehydrated- lessened fluid restriction to 1800cc ? Barriers to Discharge: Decreased family/caregiver support;Home enviroment access/layout;Medical stability;Wound care;Weight ? Barriers to Discharge Comments: min A RW- 150s- needs AFO assessment ?Possible Resolutions  to [employer] Focus: order another abd binder; drops for L eye scratch ; tremors- added Keppra; miralax for constipation; dehydrated/tenting- lessened fluid restriciton- dietician for educaiton on gout/CHF- AFO assessment- B/L - d/c 5/3 ? ? ?Continued Need for Acute Rehabilitation Level of  Care: The patient requires daily medical management by a physician with specialized training in physical medicine and rehabilitation for the following reasons: ?Direction of a multidisciplinary physical rehabilitation program to maximize functional independence : Yes ?Medical management of patient stability for increased activity during participation in an intensive rehabilitation regime.: Yes ?Analysis of laboratory values and/or radiology reports with any subsequent need for medication adjustment and/or medical intervention. : Yes ? ? ?I attest that I was present, lead the team conference, and concur with the assessment and plan of the team. ? ? ?Dorthula Nettles G ?04/08/2022, 2:17 PM  ? ? ? ? ? ? ?

## 2022-04-09 ENCOUNTER — Ambulatory Visit: Payer: Medicare Other | Admitting: Interventional Cardiology

## 2022-04-09 ENCOUNTER — Encounter (HOSPITAL_COMMUNITY): Payer: Medicare Other

## 2022-04-09 MED ORDER — SORBITOL 70 % SOLN
30.0000 mL | Freq: Once | Status: AC
  Administered 2022-04-09: 30 mL via ORAL
  Filled 2022-04-09: qty 30

## 2022-04-09 NOTE — Progress Notes (Signed)
Patient ID: [name], female   DOB: 15-Sep-1946, 76 y.o.   MRN: [id number] ? ?SW met with pt and pt husband in room to provide home care agency list. Pt and husband would like to complete HCPOA.  ? ?SW informed pt assigned RN requesting chaplain consult.  ? ?Loralee Pacas, MSW, LCSWA ?Office: [phone] ?Cell: [phone] ?Fax: [phone]  ?

## 2022-04-09 NOTE — Progress Notes (Signed)
Physical Therapy Session Note ? ?Patient Details  ?Name: Franceska Strahm Loewe ?MRN: [id number] ?Date of Birth: [birth year] ? ?Today's Date: 04/09/2022 ?PT Individual Time: 1100-1200; 2952-8413 ?PT Individual Time Calculation (min): 60 min ; 42 min ? ?Short Term Goals: ?Week 1:  PT Short Term Goal 1 (Week 1): Pt will perform supine<>sit with supervision ?PT Short Term Goal 2 (Week 1): Pt will perform sit<>stands using LRAD with CGA ?PT Short Term Goal 3 (Week 1): Pt will perform bed<>chair transfers using LRAD with CGA ?PT Short Term Goal 4 (Week 1): Pt will ambulate at least 15f using LRAD with CGA ?PT Short Term Goal 5 (Week 1): Pt will participate in an outcome measure to assess fall risk ? ?Skilled Therapeutic Interventions/Progress Updates:  ?Session 1: ?Pt received sitting in w/c and agreeable to therapy. Bilat AFO's donned. Pt transported to therapy gym in w/c for time management. Stand-pivot transfer CGA with RW from w/c > mat table in order to don [employer]iSky harness. Pt performed the following in [employer]: ? ?Forward/retro gait with and without RW; CGA and HHA for safety. Successful forward gait x 2 trials with good foot clearance and initial contact bilat, narrow BOS. During retrogait, demonstrates decreased step length and stance time bilaterally. Posterior LOB during retrogait trials without RW. Retropulsion increases with LOB and requires maxA to recover with max verbal cuing to dig toes into the ground, engage glutes, and widen BOS. ? ?Forward/retro gait with wooden plank; CGA and HHA for safety, wooden plank as visual cue to maintain wider BOS when stepping. Demonstrated improved BOS until fatigue increased with retrogait; posterior LOB noted again, pt instructed to turn around so she could walk forwards on last trial d/t not being able to fully regain balance with maxA and same verbal cues. ? ?Pt was pleased with the opportunity to safely [facility]and expresses decreased  fear of falling after education on strategies to regain her balance during trials. Pt transported back to room in w/c for time management. Pt remained in w/c after session, left with all needs in reach. ? ?Session 2: ?Pt received sitting in w/c, handed off from OT in rehab apartment with pt's husband (Dominica Severin present throughout session for family education. PT student provided education on management of w/c parts with pt's husband (placing/removing leg rests, locking brakes). Bed mobility performed supervision overall, but minA sit > lying to help move the LLE. CGA for safety during all transfers today including STS and stand-pivot transfers. Pt and her husband both expressed confidence with car transfers and politely declined to perform this session. Gait training initiated with RW from rehab apt to therapy gym x [id number]-CGA for safety, pt demonstrates improved foot clearance, initial contact, and step length bilat, slightly forward flexed posture. Per request from pt and her husband, PT provided education and instruction on how to perform floor transfer in case of a fall. Pt attempted floor transfer from mat table > floor utilizing 3" step in between surfaces to lower herself using BUE's at minA level for safety. Pt assumed quadruped position on the floor through L sidelying > prone with CGA and mod verbal cuing to "inch" BUE's toward her. Pt required maxA to stand from tall-kneeling at mat table > standing position d/t limited LE mobility and bilat ankles getting "stuck" on the mat. PT educated pt on still calling for help in order to stand after a fall, d/t current limitations. Pt transported back to room in w/c for  time management. Pt left in w/c with all needs in reach, husband present. ? ?Therapy Documentation ?Precautions:  ?Precautions ?Precautions: Fall, Other (comment) ?Precaution Comments: Watch BP; has TEDs and abdominal binder, limited R shoulder ROM ?Other Brace: Thigh high TEDs and abdominal  binder for BP control ?Restrictions ?Weight Bearing Restrictions: No ?RUE Weight Bearing: Weight bearing as tolerated ?Other Position/Activity Restrictions: R wrist fx and L 5th finger fx ? ? ? ?Therapy/Group: Individual Therapy ? ?Lanetta Inch ?04/09/2022, 4:34 PM  ?

## 2022-04-09 NOTE — Plan of Care (Signed)
Nutrition Education Note ? ?RD consulted for nutrition education regarding new onset CHF. ? ?RD provided "Low Sodium Nutrition Therapy" handout from the Academy of [employer]etics. Reviewed patient's dietary recall. Which includes a variety of meat products, fresh vegetables, fruit, and soda. ? ?Provided examples on ways to decrease sodium intake in diet such as using Mrs. Dash or other spices for seasoning, looking at sodium content on nutrition labels, opting for fresh fruits and vegetables when able and rinsing canned foods or opting for low/no sodium options. Discouraged intake of processed foods and use of salt shaker. Encouraged fresh fruits and vegetables as well as whole grain sources of carbohydrates to maximize fiber intake. Pt reports that she typically does not salt her food when preparing meals. We also discussed low sodium snack options. ? ?RD discussed why it is important for patient to adhere to diet recommendations, and emphasized the role of fluids, foods to avoid, and importance of weighing self daily. Teach back method used. ? ?Expect good compliance. ? ?Body mass index is 28.24 kg/m?Marland Kitchen Pt meets criteria for overweight based on current BMI. ? ?Current diet order is Heart Healthy, patient is consuming approximately 100% of meals at this time. Labs and medications reviewed. No further nutrition interventions warranted at this time. RD contact information provided. If additional nutrition issues arise, please re-consult RD.  ? ?Clayborne Dana, RDN, LDN ?Clinical Nutrition  ?

## 2022-04-09 NOTE — Progress Notes (Signed)
Occupational Therapy Session Note ? ?Patient Details  ?Name: [name] ?MRN: [id number] ?Date of Birth: 13-Aug-1946 ? ?Today's Date: 04/09/2022 ?OT Individual Time: 9509-3267 and [phone] ?OT Individual Time Calculation (min): 58 min and 39 min ? ? ?Short Term Goals: ?Week 1:  OT Short Term Goal 1 (Week 1): STG = LTG 2/2 ELOS ? ?Skilled Therapeutic Interventions/Progress Updates:  ?  1) Treatment session with focus on functional transfers, mobility, and self-care retraining.  Pt received seated EOB expressing desire to shower.  Pt ambulated to shower with ambulatory transfer with RW and CGA.  Pt required mod assist sit > stand from tub bench post shower due to low seat height and fatigue.  Pt required physical assistance to wash back and lower legs.  Therapist educated on use of long handled sponge/loofah for increased independence with washing feet and back. Pt reports using reacher at home to don underwear/pants, required assist this session without use of reacher.  Pt required increased assist when pulling pants up due to tight nature of pants.  Pt ambulated to sink with CGA with RW and completed oral and hair care in standing with supervision.  Pt returned to bed and left semi-reclined with all needs in reach. ? ?2) Treatment session with focus on initiation of family education with pt and pt's husband.  Pt received seated EOB.  Pt showing pictures of her 2 bathroom's and shower setup.  Pt agreeable to going to ADL apt to complete simulated shower transfers.  Pt completed stand pivot transfer from elevated EOB to w/c with CGA with RW.  Therapist setup simulated shower access to mimic home setup.  Pt completed side stepping and backwards stepping to access shower due to tight spaces in home shower.  Pt able to complete side stepping and backwards stepping with CGA with RW.  Pt required mod assist sit > stand from shower chair due to lower seat height and fatigue.  Pt will continue to benefit from practice with sit  > stand from variety of heights.  Pt's husband to measure walkway in bathroom to ensure RW fits through bathroom.  Pt passed off to PT. ? ?Therapy Documentation ?Precautions:  ?Precautions ?Precautions: Fall, Other (comment) ?Precaution Comments: Watch BP; has TEDs and abdominal binder, limited R shoulder ROM ?Other Brace: Thigh high TEDs and abdominal binder for BP control ?Restrictions ?Weight Bearing Restrictions: No ?RUE Weight Bearing: Weight bearing as tolerated ?Other Position/Activity Restrictions: R wrist fx and L 5th finger fx ?General: ?  ?Vital Signs: ? ?Pain: ? Pt with c/o pain 8/10 in BLE due to neuropathy.  RN aware. ? ? ?Therapy/Group: Individual Therapy ? ?Simonne Come ?04/09/2022, 9:11 AM ?

## 2022-04-10 ENCOUNTER — Inpatient Hospital Stay (HOSPITAL_COMMUNITY): Payer: Medicare Other

## 2022-04-10 MED ORDER — FUROSEMIDE 40 MG PO TABS
40.0000 mg | ORAL_TABLET | Freq: Two times a day (BID) | ORAL | Status: AC
  Administered 2022-04-10 (×2): 40 mg via ORAL
  Filled 2022-04-10 (×2): qty 1

## 2022-04-10 MED ORDER — POTASSIUM CHLORIDE CRYS ER 20 MEQ PO TBCR
40.0000 meq | EXTENDED_RELEASE_TABLET | Freq: Two times a day (BID) | ORAL | Status: AC
  Administered 2022-04-10 (×2): 40 meq via ORAL
  Filled 2022-04-10 (×2): qty 2

## 2022-04-10 MED ORDER — SIMETHICONE 80 MG PO CHEW
80.0000 mg | CHEWABLE_TABLET | Freq: Four times a day (QID) | ORAL | Status: DC
  Administered 2022-04-10 – 2022-04-16 (×23): 80 mg via ORAL
  Filled 2022-04-10 (×23): qty 1

## 2022-04-10 NOTE — Progress Notes (Signed)
Occupational Therapy Session Note ? ?Patient Details  ?Name: [name] ?MRN: [id number] ?Date of Birth: March 19, 1946 ? ?Today's Date: 04/10/2022 ?OT Individual Time: 7948-0165 ?OT Individual Time Calculation (min): 53 min  ? ? ?Short Term Goals: ?Week 1:  OT Short Term Goal 1 (Week 1): STG = LTG 2/2 ELOS ? ?Skilled Therapeutic Interventions/Progress Updates:  ?Skilled OT intervention completed with focus on blood pressure and pain management, functional transfers. Pt received upright in bed, c/o 10/10 pain in head and BLEs with additional report of feeling really shaky and dizzy. MD Lovorn in room for rounds to assess pt, with MD stating that fluid restriction and med adjustments need to be made, with order to donn ACE wrap thigh high to BLEs in addition to TEDS and abdominal binder. Therapist donned thigh high TEDS, 2 aCE wrap bandages with total A. Pt still c/o headache with pt calling for nurse to administer meds. Nurse in room, offering meds however therapist suggesting BP be checked 2/2 pt status prior to administering. ? ?BP (dynm) as follows: ?Supine L arm- 68/46 ?Supine R arm- 73/41  ?Sitting EOB- 68/44 ? ?Manual: ?88/56 ? ?Nurse recommending to lay pt down/elevate feet with therapist assisting with bed mobility, boosting with +2 assist. Pt with questions about her R wrist fracture, with therapist providing light education on surgical options, with pt able to demonstrate finger opposition and min elbow flexion 2/2 pain. Pt requested warm blanket, with therapist retrieving for pain and comfort management. Nurse at pt's bedside at direct care handoff with all immediate needs met. ? ? ?Therapy Documentation ?Precautions:  ?Precautions ?Precautions: Fall, Other (comment) ?Precaution Comments: Watch BP; has TEDs and abdominal binder, limited R shoulder ROM ?Other Brace: Thigh high TEDs and abdominal binder for BP control ?Restrictions ?Weight Bearing Restrictions: Yes ?RUE Weight Bearing: Weight bearing as  tolerated ?Other Position/Activity Restrictions: R wrist fx and L 5th finger fx ? ? ? ? ?Therapy/Group: Individual Therapy ? ?[place] ?04/10/2022, 7:35 AM ?

## 2022-04-10 NOTE — Progress Notes (Signed)
Recreational Therapy Session Note ? ?Patient Details  ?Name: [name] ?MRN: [id number] ?Date of Birth: [birth year] ?Today's Date: 04/10/2022 ? ?Pain: no c/o ?Skilled Therapeutic Interventions/Progress Updates: Pt participated in stress managment/coping group today per team referral. Pt education/discussion focused on stress exploration including factors that contribute to stress, factors that protect against stress and potential coping strategies.  Coping strategies included deep breathing, progressive muscle relaxation, imagery & challenging irrational thoughts. Pt actively participated in discussion and appreciative of information and socialization. Handouts provided. ? ? ?Jefrey Raburn ?04/10/2022, 4:18 PM  ?

## 2022-04-10 NOTE — Progress Notes (Signed)
?                                                       PROGRESS NOTE ? ? ?Subjective/Complaints: ? ?Feels acutely SOB- this AM;  ?Also feels like feet swollen- ?Also having dry mouth- wondering what to do about this.  ?Jerking is worse- didn't have more yesterday, but worse this AM.  ? ? ?ROS:  ? ?Pt denies (+) SOB, abd pain, CP, N/V/C/D, and vision changes ? ? ?Objective: ?  ?No results found. ?No results for input(s): WBC, HGB, HCT, PLT in the last 72 hours. ? ?No results for input(s): NA, K, CL, CO2, GLUCOSE, BUN, CREATININE, CALCIUM in the last 72 hours. ? ? ?Intake/Output Summary (Last 24 hours) at 04/10/2022 0850 ?Last data filed at 04/10/2022 7564 ?Gross per 24 hour  ?Intake 942 ml  ?Output --  ?Net 942 ml  ?  ? ?  ? ?Physical Exam: ?Vital Signs ?Blood pressure (!) 103/54, pulse 89, temperature 98.5 ?F (36.9 ?C), temperature source Oral, resp. rate 16, height '5\' 7"'$  (1.702 m), weight 80.6 kg, SpO2 100 %. ? ? ? ? 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   Alert and oriented x 3. Normal insight and awareness. Intact Memory. Normal language and speech. Cranial nerve exam unremarkable    ? ? ?Assessment/Plan: ?1. Functional deficits which require 3+ hours per day of interdisciplinary therapy in a comprehensive inpatient rehab setting. ?Physiatrist is providing close team supervision and 24 hour management of active medical problems listed below. ?Physiatrist and rehab team continue to assess barriers to discharge/monitor patient progress toward functional and medical  goals ? ?Care Tool: ? ?Bathing ?   ?Body parts bathed by patient: Right arm, Left arm, Chest, Face, Abdomen, Front perineal area, Right upper leg, Left upper leg, Buttocks  ? Body parts bathed by helper: Right lower leg, Left lower leg ?  ?  ?Bathing assist Assist Level: Minimal Assistance - Patient > 75% ?  ?  ?Upper Body Dressing/Undressing ?Upper body dressing   ?What is the patient wearing?: Pull over shirt ?   ?Upper body assist Assist Level: Set up assist ?   ?Lower Body Dressing/Undressing ?Lower body dressing ? ? ?   ?What is the patient wearing?: Underwear/pull up, Pants ? ?  ? ?Lower body assist Assist for lower body dressing: Moderate Assistance - Patient 50 - 74% ?   ? ?Toileting ?Toileting    ?Toileting assist Assist for toileting: Minimal Assistance - Patient > 75% ?  ?  ?Transfers ?Chair/bed transfer ? ?Transfers assist ?   ? ?Chair/bed transfer assist level: Contact Guard/Touching assist ?Chair/bed transfer assistive device: Armrests, Walker ?  ?Locomotion ?Ambulation ? ? ?Ambulation assist ? ?   ? ?Assist level: Contact Guard/Touching assist ?Assistive device: Walker-rolling ?Max distance: 70f  ? ?Walk 10 feet activity ? ? ?Assist ?   ? ?Assist level: Contact Guard/Touching assist ?  Assistive device: Walker-rolling  ? ?Walk 50 feet activity ? ? ?Assist   ? ?Assist level: Contact Guard/Touching assist ?Assistive device: Walker-rolling  ? ? ?Walk 150 feet activity ? ? ?Assist   ? ?Assist level: Contact Guard/Touching assist ?Assistive device: Walker-rolling ?  ? ?Walk 10 feet on uneven surface  ?activity ? ? ?Assist   ? ? ?Assist level: Minimal Assistance - Patient > 75% ?Assistive device: Walker-rolling  ? ?Wheelchair ? ? ? ? ?Assist Is the patient using a wheelchair?: No ?  ?  ? ?  ?   ? ? ?Wheelchair 50 feet with 2 turns activity ? ? ? ?Assist ? ?  ?  ? ? ?   ? ?Wheelchair 150 feet activity  ? ? ? ?Assist ?   ? ? ?   ? ?Blood pressure (!) 103/54, pulse 89, temperature 98.5 ?F (36.9 ?C), temperature  source Oral, resp. rate 16, height '5\' 7"'$  (1.702 m), weight 80.6 kg, SpO2 100 %. ? ?Medical Problem List and Plan: ?1. Functional deficits secondary to debility related to CHF exacerbation/pulmonary emboli/gout/history of CVA ?            -patient may shower ?            -ELOS/Goals: 5-7 days ?            Continue CIR- PT, OT  ? D/c date next Wednesday, but if medical issues occur, can extend? ?2.  Antithrombotics: ?-DVT/anticoagulation:  Pharmaceutical: Other (comment) Eliquis ?            -antiplatelet therapy: N/A ?3. Pain Management/chronic pain syndrome: Baclofen 5 mg 3 times daily, Neurontin 100 mg 3 times daily, hydrocodone as needed ? 4/22- pain controlled- con't regimen ? 4/23- gout pain getting worse again- will reduce Baclofen to 5 mg BID due to renal function ? 4/25- changed Norco to Oxy, but didn't work- will change back to Norco 15 mg q6 hours prn ?4. Mood: Cymbalta 60 mg twice daily, melatonin 5 mg nightly ? 4/23- will reduce Duloxetine to 60 mg daily due to renal function ?            -antipsychotic agents: N/A ?5. Neuropsych: This patient is capable of making decisions on her own behalf. ?6. Skin/Wound Care: Routine skin checks ?7. Fluids/Electrolytes/Nutrition: Routine in and outs with follow-up chemistries ?8.  Acute on chronic diastolic congestive heart failure.  Follow-up per heart failure team.  Monitor for any signs of fluid overload.  Continue Demadex 20 mg daily ?  ?Filed Weights  ? 04/08/22 0500 04/09/22 0444 04/10/22 0500  ?Weight: 81.8 kg 81.8 kg 80.6 kg  ?  4/25- Weight up almost 3 kg in 1 day- will check if correct and follow trend- will change fluid restriction to 1800cc due to (+) tenting on exam ? 4/27- will change fluid restriction back to 1500cc- give Lasix 40 mg BID x2 since has fluid overload- educated that needs to stick to fluid restriction and educated on CHF vs low BP.If SOB doesn't improve by noon will get CXR PA and lateral.   ? ?9.  Hypotension.  ProAmatine 10 mg 3 times  daily.  Monitor with increased mobility ? 4/27- doing ACE wraps, abd binder and thigh high TEDs ?10.  Gout.  Uric acid 11.1.  Prednisone taper ? 4/23- will give Colchicine 1.2 mg and then 0.6 mg later and then cannot repeat for 2 weeks- no allopurinol until gout flare resolved ?4/24 pain still present today albeit slightly improved, still  affecting mobility.  ?-give prednisone '10mg'$  x 2 ? 4/25- will get dietician consult for pt education on foods cannot eat.  ?11.  Restless leg syndrome.  Continue Requip ?12.  OSA.  Continue CPAP ?13.  CKD stage III.   ?4/24 further improvement of Cr to 1.83 although BUN sl elevated ?-continue to monitor ?-encourage appropriate fluids  ?14.  Obesity with history of gastric bypass.  BMI 27.62 ? 4/22- BMI 27.14-  ?15.  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 Hypokalemia ? 4/24 at risk since on Demadex- 3.6 today  ? 4/27- will give 40 mEq x2 since giving additional Lasix 40 mg BID x2-  ?17. Constipation ? 4/22- LBM 3+ days ago- will give sorbitol cannot push stool out- might need enema? ? 4/24- LBM this morning ? 4/25- add miralax daily per pt request ? 4/27- finally went yesterday after sorbitol/soap suds enema ?18. L knee wound due to DM ? 4/23- will added xeroform to see if will help wound heal.  ?19. Congestion/cough: chest clear, weights stable ? -?allergies, post-nasal drip ? -flonase, cough drops ordered ?20. L eye scratchiness ? 4/25- will add eye drops to flus eye QID prn ?21. Dizziness/dehydration- ? 4/25- will change fluid restriction to 1800cc restriction.  ?22. Tremors ? 4/25- will try Keppra 250 mg BID ? 4/27- tremors worse today, but were better yesterday ?   ? ?I spent a total of  53  minutes on total care today- >50% coordination of care- due to d/w pharmacy, therapy and prolonged d/w pt about SOB and CHF vs low BP- hard balance and one will not be as good.  ? ? ? ?LOS: ?6 days ?A FACE TO FACE EVALUATION WAS PERFORMED ? ?Glendale Wherry ?04/10/2022, 8:50 AM  ? ? ? ?

## 2022-04-10 NOTE — Progress Notes (Incomplete)
Physical Therapy Session Note ? ?Patient Details  ?Name: [name] ?MRN: [id number] ?Date of Birth: 06/25/1946 ? ?Today's Date: 04/10/2022 ?PT Individual Time: 3500-9381 ?PT Individual Time Calculation (min): 28 min  ? ?Short Term Goals: ?Week 1:  PT Short Term Goal 1 (Week 1): Pt will perform supine<>sit with supervision ?PT Short Term Goal 2 (Week 1): Pt will perform sit<>stands using LRAD with CGA ?PT Short Term Goal 3 (Week 1): Pt will perform bed<>chair transfers using LRAD with CGA ?PT Short Term Goal 4 (Week 1): Pt will ambulate at least 156f using LRAD with CGA ?PT Short Term Goal 5 (Week 1): Pt will participate in an outcome measure to assess fall risk ? ?Skilled Therapeutic Interventions/Progress Updates:  ?Pt initially not present in room, nursing reports pt left to receive x-ray d/t chest pains.  ?BP: ?Sitting 105/56 ?Standing 87/55 ?Standing after 2 min 101/53 ? ?Therapy Documentation ?Precautions:  ?Precautions ?Precautions: Fall, Other (comment) ?Precaution Comments: Watch BP; has TEDs and abdominal binder, limited R shoulder ROM ?Other Brace: Thigh high TEDs and abdominal binder for BP control ?Restrictions ?Weight Bearing Restrictions: Yes ?RUE Weight Bearing: Weight bearing as tolerated ?Other Position/Activity Restrictions: R wrist fx and L 5th finger fx ? ? ?Therapy/Group: Individual Therapy ? ?RLanetta Inch?04/10/2022, 5:16 PM  ?

## 2022-04-10 NOTE — Progress Notes (Signed)
Physical Therapy Session Note ? ?Patient Details  ?Name: [name] ?MRN: [id number] ?Date of Birth: [birth year] ? ?Today's Date: 04/10/2022 ?PT Individual Time: 5035-4656 ?PT Individual Time Calculation (min): 17 min   ?Today's Date: 04/10/2022 ?PT Missed Time: 13 Minutes ?Missed Time Reason: Other (Comment);Patient ill (Comment) (BP too low) ? ?Short Term Goals: ?Week 1:  PT Short Term Goal 1 (Week 1): Pt will perform supine<>sit with supervision ?PT Short Term Goal 2 (Week 1): Pt will perform sit<>stands using LRAD with CGA ?PT Short Term Goal 3 (Week 1): Pt will perform bed<>chair transfers using LRAD with CGA ?PT Short Term Goal 4 (Week 1): Pt will ambulate at least 175f using LRAD with CGA ?PT Short Term Goal 5 (Week 1): Pt will participate in an outcome measure to assess fall risk ? ?Skilled Therapeutic Interventions/Progress Updates:  ? Received pt semi-reclined in bed feeling "sick" and short of breath - immediately checked O2 sats - 93% on RA. Pt then reported feeling hot and sweaty - provided pt with cool washcloths for relief and checked BP (ted hose and ace wraps on): 79/37 (Trial 1) - L arm, 76/39 (Trial 2 ) - L arm, and 85/41 (Trial 3) - R arm. RN alerted and stated that MD and PA aware and that pt's BP has been about that low all morning. Pt requested hard candy - checked with RN, then provided pt with some. Pt apologetic about not being able to participate in therapy this morning. Encouraged pt to rest and reinforced that BP currently too low to be safely participating in OOB mobility anyway. Concluded session with pt semi-reclined in bed, needs within reach, and bed alarm on. 13 minutes missed of skilled physical therapy due to feeling ill and low BP.  ? ?Therapy Documentation ?Precautions:  ?Precautions ?Precautions: Fall, Other (comment) ?Precaution Comments: Watch BP; has TEDs and abdominal binder, limited R shoulder ROM ?Other Brace: Thigh high TEDs and abdominal binder for BP  control ?Restrictions ?Weight Bearing Restrictions: Yes ?RUE Weight Bearing: Weight bearing as tolerated ?Other Position/Activity Restrictions: R wrist fx and L 5th finger fx ? ?Therapy/Group: Individual Therapy ?ABlenda Nicely?ABecky SaxPT, DPT  ?04/10/2022, 6:32 AM  ?

## 2022-04-10 NOTE — Progress Notes (Signed)
Occupational Therapy Session Note ? ?Patient Details  ?Name: [name] ?MRN: [id number] ?Date of Birth: 10-Oct-1946 ? ?Today's Date: 04/10/2022 ?OT Group Time: 3295-1884 ?OT Group Time Calculation (min): 59 min ? ? ?Short Term Goals: ?Week 1:  OT Short Term Goal 1 (Week 1): STG = LTG 2/2 ELOS ? ?Skilled Therapeutic Interventions/Progress Updates:  ?Pt participated in group session with a focus on stress mgmt, education on healthy coping strategies, and social interaction. Focus of session on providing coping strategies to manage new diagnosis to allow for improved mental health to increase overall quality of life . Discussed how to break down stressors into ?daily hassles,? ?major life stressors? and ?life circumstances? in an effort to allow pts to chunk their stressors into groups and determine where to best put their efforts/time when dealing with stress. Pt actively sharing stressors and contributing to group conversation. Provided active listening, emotional support and therapeutic use of self. Offered education on factors that protect [place] against stress such as ?daily uplifts,? ?healthy coping strategies? and ?protective factors.? Encouraged all group members to make an effort to actively recall one event from their day that was a daily uplift in an effort to protect their mindset from stressors as well as sharing this information with their caregivers to facilitate improved caregiver communication and decrease overall burden of care.  Issued pt handouts on healthy coping strategies to implement into routine. Pt transported back to room by RT. ? ?Therapy Documentation ?Precautions:  ?Precautions ?Precautions: Fall, Other (comment) ?Precaution Comments: Watch BP; has TEDs and abdominal binder, limited R shoulder ROM ?Other Brace: Thigh high TEDs and abdominal binder for BP control ?Restrictions ?Weight Bearing Restrictions: Yes ?RUE Weight Bearing: Weight bearing as tolerated ?Other Position/Activity Restrictions:  R wrist fx and L 5th finger fx ? ?Pain: no pain reported during session  ?Therapy/Group: Group Therapy ? ?Precious Haws ?04/10/2022, 4:15 PM ?

## 2022-04-10 NOTE — Plan of Care (Signed)
?  Problem: Consults ?Goal: RH GENERAL PATIENT EDUCATION ?Description: See Patient Education module for education specifics. ?Outcome: Progressing ?Goal: Skin Care Protocol Initiated - if Braden Score 18 or less ?Description: If consults are not indicated, leave blank or document N/A ?Outcome: Progressing ?  ?Problem: RH SKIN INTEGRITY ?Goal: RH STG MAINTAIN SKIN INTEGRITY WITH ASSISTANCE ?Description: STG Maintain Skin Integrity With Supervision Assistance. ?Outcome: Progressing ?Goal: RH STG ABLE TO PERFORM INCISION/WOUND CARE W/ASSISTANCE ?Description: STG Able To Perform Incision/Wound Care With Supervision Assistance. ?Outcome: Progressing ?  ?Problem: RH SAFETY ?Goal: RH STG ADHERE TO SAFETY PRECAUTIONS W/ASSISTANCE/DEVICE ?Description: STG Adhere to Safety Precautions With Cues and Reminders. ?Outcome: Progressing ?Goal: RH STG DECREASED RISK OF FALL WITH ASSISTANCE ?Description: STG Decreased Risk of Fall With Supervision Assistance. ?Outcome: Progressing ?  ?Problem: RH PAIN MANAGEMENT ?Goal: RH STG PAIN MANAGED AT OR BELOW PT'S PAIN GOAL ?Description: < 3 on a 0-10 pain scale. ?Outcome: Progressing ?  ?Problem: RH KNOWLEDGE DEFICIT GENERAL ?Goal: RH STG INCREASE KNOWLEDGE OF SELF CARE AFTER HOSPITALIZATION ?Description: Patient will demonstrate knowledge of  self-care management, medication/pain management, skin/wound care, and safety precautions with educational materials and handouts provided by staff independently at discharge. ?Outcome: Progressing ?  ?

## 2022-04-10 NOTE — Progress Notes (Signed)
Patient up resting in bed. Patient continues to C/O right arm pain, and chest discomfort. Per Dr. Dagoberto Ligas if patient does not feel better by noon obtain a 2 view chest XRAY. Patient blood pressure running low, unable to give Norco at this time due to low B/P. Dan NP aware of low blood pressures, no new orders.  Patient offered tylenol several times. Patient finally agreed  to take tylenol which patient received some relief.  ?

## 2022-04-10 NOTE — Progress Notes (Signed)
Recreational Therapy Session Note ? ?Patient Details  ?Name: [name] ?MRN: [id number] ?Date of Birth: Jul 18, 1946 ?Today's Date: 04/10/2022 ? ?Pain: c/o abdominal pain, nurse aware, not able to receive meds as BP readings are low.  Heat and or ice offered by NT per nurse directions.  ? ?Skilled Therapeutic Interventions/Progress Updates: Attempted eval completion, pt unable as she states she is feeling well.  Pt in bed stating it has been a rough morning but excited about finally having a BM.  Pt reported low BPs this morning and requested BP check.  BP 86/47 supine in bed, 02sats 98%, HR  99.  Will attempt to see pt at a later time.  Pt is hopeful she will feel well enough to attend group session this afternoon. ? ?Shakeitha Umbaugh ?04/10/2022, 10:53 AM  ?

## 2022-04-10 NOTE — Progress Notes (Signed)
Physical Therapy Session Note ? ?Patient Details  ?Name: [name] ?MRN: [id number] ?Date of Birth: 07/29/1946 ? ?Today's Date: 04/10/2022 ?PT Individual Time: 2706-2376 ?PT Individual Time Calculation (min): 28 min  ? ?Short Term Goals: ?Week 1:  PT Short Term Goal 1 (Week 1): Pt will perform supine<>sit with supervision ?PT Short Term Goal 2 (Week 1): Pt will perform sit<>stands using LRAD with CGA ?PT Short Term Goal 3 (Week 1): Pt will perform bed<>chair transfers using LRAD with CGA ?PT Short Term Goal 4 (Week 1): Pt will ambulate at least 170f using LRAD with CGA ?PT Short Term Goal 5 (Week 1): Pt will participate in an outcome measure to assess fall risk ?  ? ?Skilled Therapeutic Interventions/Progress Updates:  ?Missed therapy time d/t pt initially not being present in room, nursing reports pt left to receive x-ray after c/o chest pains. Nursing also reports pt was hypotensive since the AM. Orthotics evaluation originally scheduled for this session; rescheduled to 4/28. Therapy returned to pt's room after PTA saw patient being transported back. Pt received sitting EOB. Reports no sx of dizziness but increased pain (unrated) in BUE's. Pt reports nausea onset after attempting to eat some of her magic cup; nursing notified and able to give pt medication. Pt was eager to participate with therapy; PT student explained that BP could be re-assessed to determine if pt would be safe to participate. Values listed below: ? ?Sitting 105/56 (without binder) ?Standing 87/55 (with binder) ?Standing after 2 min 101/53 (with binder) ? ?STS from bed with RW minA, increased time to complete. Pt agreeable to attempting ambulation. Gait training initiated [id number] RW CGA and w/c follow for safety d/t closely monitoring BP and ambulation without AFOs -- demonstrates decr L foot clearance, foot flat bilat, decr gait speed, verbal cuing provided for attending to foot placement and clearance to prevent ankle rolling. Pt  transported back to room in w/c for time management. Left in w/c with all needs in reach. ? ?Therapy Documentation ?Precautions:  ?Precautions ?Precautions: Fall, Other (comment) ?Precaution Comments: Watch BP; has TEDs and abdominal binder, limited R shoulder ROM ?Other Brace: Thigh high TEDs and abdominal binder for BP control ?Restrictions ?Weight Bearing Restrictions: Yes ?RUE Weight Bearing: Weight bearing as tolerated ?Other Position/Activity Restrictions: R wrist fx and L 5th finger fx ?General: ?PT Amount of Missed Time (min): 32 Minutes ?PT Missed Treatment Reason: Xray ? ? ? ?Therapy/Group: Individual Therapy ?ReLanetta Inch4/27/2023, 5:21 PM  ?

## 2022-04-11 NOTE — Progress Notes (Signed)
Per Dr. Dagoberto Ligas ok to give patient Norco with low B/P. Dr. Dagoberto Ligas states patients B/P runs low, call Dr. Dagoberto Ligas with  any questions or concerns. B/P 08/23/55 P 74 R 18. ?

## 2022-04-11 NOTE — Progress Notes (Signed)
?                                                       PROGRESS NOTE ? ? ?Subjective/Complaints: ? ?Pt reports didn't get pain meds for ?36 hours- spoke to nursing, said I ordered it since BP was low- explained that pt has been on for years- I think it's important that we keep lines of communication open and that we will need to make changes ot parameters so pt doesn't suffer like this again.  ? ?SOB much improved this am.  ?CXR didn't show fluid overload.  ? ? ?ROS:  ? ?Pt denies SOB, abd pain, CP, N/V/C/D, and vision changes ? ? ?Objective: ?  ?DG Chest 2 View ? ?Result Date: 04/10/2022 ?CLINICAL DATA:  Chest pain on LEFT EXAM: CHEST - 2 VIEW COMPARISON:  03/30/2022 FINDINGS: Enlargement of cardiac silhouette with loop recorder noted. Mediastinal contours and pulmonary vascularity normal. Chronic bronchitic changes. Lungs grossly clear. No pulmonary infiltrate, pleural effusion, or pneumothorax. RIGHT hilum appears less prominent than on previous exam. Bones demineralized with RIGHT shoulder prosthesis seen. Multiple old LEFT rib fractures. Scattered endplate spur formation thoracic spine. IMPRESSION: Enlargement of cardiac silhouette. Chronic bronchitic changes without acute infiltrate. Electronically Signed   By: Lavonia Dana M.D.   On: 04/10/2022 13:24   ?No results for input(s): WBC, HGB, HCT, PLT in the last 72 hours. ? ?No results for input(s): NA, K, CL, CO2, GLUCOSE, BUN, CREATININE, CALCIUM in the last 72 hours. ? ? ?Intake/Output Summary (Last 24 hours) at 04/11/2022 1621 ?Last data filed at 04/11/2022 0700 ?Gross per 24 hour  ?Intake 415 ml  ?Output --  ?Net 415 ml  ?  ? ?  ? ?Physical Exam: ?Vital Signs ?Blood pressure 102/61, pulse 87, temperature 98.3 ?F (36.8 ?C), temperature source Oral, resp. rate 16, height '5\' 7"'$  (1.702 m), weight 76.1 kg, SpO2 93 %. ? ? ? ? ? ?General: awake, alert, appropriate,sitting up in bed; near tears over pain meds; NAD ?HENT: conjugate gaze; oropharynx moist ?CV: regular  rate; no JVD ?Pulmonary: CTA B/L; no W/R/R- good air movement ?GI: soft, NT, ND, (+)BS ?Psychiatric: appropriate- tearful ?Neurological: Ox3 ? ?Extremities; trace LE edema ?MS: mild ankle/hand swelling/effusions noted ?skin: has what appears to be developing tophi on L 3rd digit ?L lateral knee wound C/D/I dry dressing ?Neurological:  ?   Alert and oriented x 3. Normal insight and awareness. Intact Memory. Normal language and speech. Cranial nerve exam unremarkable    ? ? ?Assessment/Plan: ?1. Functional deficits which require 3+ hours per day of interdisciplinary therapy in a comprehensive inpatient rehab setting. ?Physiatrist is providing close team supervision and 24 hour management of active medical problems listed below. ?Physiatrist and rehab team continue to assess barriers to discharge/monitor patient progress toward functional and medical goals ? ?Care Tool: ? ?Bathing ?   ?Body parts bathed by patient: Right arm, Left arm, Chest, Face, Abdomen, Front perineal area, Right upper leg, Left upper leg, Buttocks  ? Body parts bathed by helper: Right lower leg, Left lower leg ?  ?  ?Bathing assist Assist Level: Minimal Assistance - Patient > 75% ?  ?  ?Upper Body Dressing/Undressing ?Upper body dressing   ?What is the patient wearing?: Pull over shirt ?   ?Upper body assist Assist Level: Set up  assist ?   ?Lower Body Dressing/Undressing ?Lower body dressing ? ? ?   ?What is the patient wearing?: Underwear/pull up, Pants ? ?  ? ?Lower body assist Assist for lower body dressing: Moderate Assistance - Patient 50 - 74% ?   ? ?Toileting ?Toileting    ?Toileting assist Assist for toileting: Minimal Assistance - Patient > 75% ?  ?  ?Transfers ?Chair/bed transfer ? ?Transfers assist ?   ? ?Chair/bed transfer assist level: Contact Guard/Touching assist ?Chair/bed transfer assistive device: Armrests, Walker ?  ?Locomotion ?Ambulation ? ? ?Ambulation assist ? ?   ? ?Assist level: Contact Guard/Touching assist ?Assistive  device: Walker-rolling ?Max distance: 52f  ? ?Walk 10 feet activity ? ? ?Assist ?   ? ?Assist level: Contact Guard/Touching assist ?Assistive device: Walker-rolling  ? ?Walk 50 feet activity ? ? ?Assist   ? ?Assist level: Contact Guard/Touching assist ?Assistive device: Walker-rolling  ? ? ?Walk 150 feet activity ? ? ?Assist   ? ?Assist level: Contact Guard/Touching assist ?Assistive device: Walker-rolling ?  ? ?Walk 10 feet on uneven surface  ?activity ? ? ?Assist   ? ? ?Assist level: Minimal Assistance - Patient > 75% ?Assistive device: Walker-rolling  ? ?Wheelchair ? ? ? ? ?Assist Is the patient using a wheelchair?: No ?  ?  ? ?  ?   ? ? ?Wheelchair 50 feet with 2 turns activity ? ? ? ?Assist ? ?  ?  ? ? ?   ? ?Wheelchair 150 feet activity  ? ? ? ?Assist ?   ? ? ?   ? ?Blood pressure 102/61, pulse 87, temperature 98.3 ?F (36.8 ?C), temperature source Oral, resp. rate 16, height '5\' 7"'$  (1.702 m), weight 76.1 kg, SpO2 93 %. ? ?Medical Problem List and Plan: ?1. Functional deficits secondary to debility related to CHF exacerbation/pulmonary emboli/gout/history of CVA ?            -patient may shower ?            -ELOS/Goals: 5-7 days ?           Continue CIR- PT, OT - d/c next Wednesday ?2.  Antithrombotics: ?-DVT/anticoagulation:  Pharmaceutical: Other (comment) Eliquis ?            -antiplatelet therapy: N/A ?3. Pain Management/chronic pain syndrome: Baclofen 5 mg 3 times daily, Neurontin 100 mg 3 times daily, hydrocodone as needed ? 4/22- pain controlled- con't regimen ? 4/23- gout pain getting worse again- will reduce Baclofen to 5 mg BID due to renal function ? 4/25- changed Norco to Oxy, but didn't work- will change back to Norco 15 mg q6 hours prn ? 4/28- pt didn't get pain meds for 36 hours due ot low BP- will change parameters to not hold Norco if BP low- since is chronic ?4. Mood: Cymbalta 60 mg twice daily, melatonin 5 mg nightly ? 4/23- will reduce Duloxetine to 60 mg daily due to renal function ?             -antipsychotic agents: N/A ?5. Neuropsych: This patient is capable of making decisions on her own behalf. ?6. Skin/Wound Care: Routine skin checks ?7. Fluids/Electrolytes/Nutrition: Routine in and outs with follow-up chemistries ?8.  Acute on chronic diastolic congestive heart failure.  Follow-up per heart failure team.  Monitor for any signs of fluid overload.  Continue Demadex 20 mg daily ?  ?Filed Weights  ? 04/10/22 0500 04/11/22 0611 04/11/22 1218  ?Weight: 80.6 kg 81.4 kg 76.1 kg  ?  4/25- Weight up almost 3 kg in 1 day- will check if correct and follow trend- will change fluid restriction to 1800cc due to (+) tenting on exam ? 4/27- will change fluid restriction back to 1500cc- give Lasix 40 mg BID x2 since has fluid overload- educated that needs to stick to fluid restriction and educated on CHF vs low BP.If SOB doesn't improve by noon will get CXR PA and lateral.   ? 4/28- CXR ok- weight down dramatically with Lasix ?9.  Hypotension.  ProAmatine 10 mg 3 times daily.  Monitor with increased mobility ? 4/27- doing ACE wraps, abd binder and thigh high TEDs ? 4/28- is chronic- won't hold Pain meds ?10.  Gout.  Uric acid 11.1.  Prednisone taper ? 4/23- will give Colchicine 1.2 mg and then 0.6 mg later and then cannot repeat for 2 weeks- no allopurinol until gout flare resolved ?4/24 pain still present today albeit slightly improved, still affecting mobility.  ?-give prednisone '10mg'$  x 2 ? 4/25- will get dietician consult for pt education on foods cannot eat.  ?11.  Restless leg syndrome.  Continue Requip ?12.  OSA.  Continue CPAP ?13.  CKD stage III.   ?4/24 further improvement of Cr to 1.83 although BUN sl elevated ?-continue to monitor ?-encourage appropriate fluids  ?14.  Obesity with history of gastric bypass.  BMI 27.62 ? 4/22- BMI 27.14-  ?15.  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 Hypokalemia ? 4/24 at risk since on Demadex- 3.6 today  ? 4/27- will give 40 mEq x2 since  giving additional Lasix 40 mg BID x2-  ?17. Constipation ? 4/22- LBM 3+ days ago- will give sorbitol cannot push stool out- might need enema? ? 4/24- LBM this morning ? 4/25- add miralax daily per pt reques

## 2022-04-11 NOTE — Progress Notes (Signed)
Physical Therapy Session Note ? ?Patient Details  ?Name: [name] ?MRN: [id number] ?Date of Birth: [birth year] ? ?Today's Date: 04/11/2022 ?PT Individual Time: 8299-3716 and 1303-1405 ?PT Individual Time Calculation (min): 60 min and 62 min ? ?Short Term Goals: ?Week 1:  PT Short Term Goal 1 (Week 1): Pt will perform supine<>sit with supervision ?PT Short Term Goal 2 (Week 1): Pt will perform sit<>stands using LRAD with CGA ?PT Short Term Goal 3 (Week 1): Pt will perform bed<>chair transfers using LRAD with CGA ?PT Short Term Goal 4 (Week 1): Pt will ambulate at least 110f using LRAD with CGA ?PT Short Term Goal 5 (Week 1): Pt will participate in an outcome measure to assess fall risk ? ?Skilled Therapeutic Interventions/Progress Updates: Pt presented in bed agreeable to therapy. Pt states received pain meds NOC and pain is better than yesterday. Pt with TED hose, ACE bandages, and abdominal binder applied. BP checked prior to sitting up 101/49 (65) HR 92. Pt then performed supine activities to attempt to increase BP. Pt performed ankle pumps, heel slides, hip abd/add, LTR to fatigue. Pt also performed chest press and shoulder flexion with 2lb dowel to fatigue ~15-20 ea. BP checked again 97/61 (71) HR 94. Pt then performed supine to sit with supervision and use of bed features. BP checked in sitting 101/57 (72) HR 97. PTA then donned B shoes/AFO total A for time management. Pt then ambulated to rehab gym with CGA for general endurance. Discussed use of AFO on RLE as AFO had medial strut vs lateral. Exchanged AFO for one with lateral strut with pt ambulating ~348f Pt stated felt more secure with lateral strut there exchanged. Pt then participated in toe taps to 6in step with RW x 10. PTA noted heavy reliance on BUE when weight shifting. Had pt perform again and instructed pt to decrease reliance on RW. Pt noted to be become more rigid with lateral lean when attempting to place foot on step but able to perform. PTA  then had pt perform toe taps with HHA x 10 with PTA providing mod multimodal cues and stabilization with some improvement. After brief rest, pt participated in catch without AD with PTA providing minA and providing cues to relax trunk as pt stiffened up and instead to reaching out to ball would flex knees. When pt became more relaxed pt was able to demonstrate improved balance. Pt then ambulated back to room in same manner as prior and pt left in w/c and handed off to [name] with current needs met.  ? ?Tx2: Pt presented at EOB agreeable to therapy. Pt states increased pain in BUE, nsg aware and pain meds provided as needed. Session to focus on AFO consult and standing balance. Pt performed Sit to stand CGA and ambulated to day room. [name] from [facility] and discussed different options for AFO [facility]. After extension discussion pt agreeable to go with Turbomed EXTERN to allow increased PF mobility in case of falls. Pt then participated in dynamic balance activities including reaching and placing clothespins to encourage anterior weight shifting. Pt also participated in bout of cornhole with RW. Pt with one near LOB posteirorly with PTA blocking pt and having her work on shifting weight anteriorly to correct. Pt then ambulated partial distance back to room but required w/c transport remaining distance due to fatigue. Performed ambulatory transfer to bed and performed sit to supine with supervision and use of bed features. Pt left in bed  at end of session with bed alarm on, call bell within reach and needs met.  ?   ? ?Therapy Documentation ?Precautions:  ?Precautions ?Precautions: Fall, Other (comment) ?Precaution Comments: Watch BP; has TEDs and abdominal binder, limited R shoulder ROM ?Other Brace: Thigh high TEDs and abdominal binder for BP control ?Restrictions ?Weight Bearing Restrictions: Yes ?RUE Weight Bearing: Weight bearing as tolerated ?Other Position/Activity  Restrictions: R wrist fx and L 5th finger fx ?General: ?  ?Vital Signs: ? ?Pain: ?  ?Mobility: ?  ?Locomotion : ?   ?Trunk/Postural Assessment : ?   ?Balance: ?  ?Exercises: ?  ?Other Treatments:   ? ? ? ?Therapy/Group: Individual Therapy ? ?[name] ?04/11/2022, 12:25 PM  ?

## 2022-04-11 NOTE — Progress Notes (Signed)
Still waiting for vendor to set machine. ?

## 2022-04-11 NOTE — Progress Notes (Signed)
Orthopedic Tech Progress Note ?Patient Details:  ?[name] ?08-02-1946 ?[id number] ? ?Ortho Devices ?Type of Ortho Device: Abdominal binder ?Ortho Device/Splint Location: STOMACH ?Ortho Device/Splint Interventions: Ordered ?  ?Post Interventions ?Patient Tolerated: Well ?Instructions Provided: Care of device ? ?[name] ?04/11/2022, 10:29 AM ? ?

## 2022-04-11 NOTE — Progress Notes (Signed)
Occupational Therapy Weekly Progress Note ? ?Patient Details  ?Name: [name] ?MRN: [id number] ?Date of Birth: 1946/07/06 ? ?Beginning of progress report period: April 05, 2022 ?End of progress report period: April 11, 2022 ? ?Today's Date: 04/11/2022 ?OT Individual Time: 1103-1200 ?OT Individual Time Calculation (min): 57 min  ? ? ?Patient does not have STGs due to ELOS.  Pt is making steady progress towards LTGs with improved ease with sit > stand up to CGA from elevated surfaces or with UE support. Pt does continue to require mod assist with sit > stand from lower surfaces and when fatigued (such as post shower).  Pt progress has been limited due to multiple medical issues, most recently low BP impacting participation. ? ?Patient continues to demonstrate the following deficits: muscle weakness, decreased cardiorespiratoy endurance, impaired timing and sequencing and unbalanced muscle activation, diplopia/disturbance of vision, and decreased standing balance, decreased balance strategies, and decreased sensation and therefore will continue to benefit from skilled OT intervention to enhance overall performance with BADL, iADL, and Reduce care partner burden. ? ?Patient progressing toward long term goals..  Continue plan of care. ? ?OT Short Term Goals ?Week 1:  OT Short Term Goal 1 (Week 1): STG = LTG 2/2 ELOS ?OT Short Term Goal 1 - Progress (Week 1): Progressing toward goal ?Week 2:  OT Short Term Goal 1 (Week 2): STG = LTG 2/2 ELOS ? ?Skilled Therapeutic Interventions/Progress Updates:  ?  Treatment session with focus on sit > stand, standing tolerance, and functional mobility.  Pt received upright in w/c expressing desire to attend therapeutic singing activity in Dayroom.  In Dayroom, engaged in sit > stand with stepping up on to scale as pt wishing to weigh herself.  Pt able to stand with mod assist due to positioning of scale and w/c.  Pt then engaged in sit > stand throughout singing activity with pt  fluctuating from mod - CGA depending on fatigue level.  Pt tolerated standing 5 min before demonstrating increased sway and requesting to sit.  Pt again stood 7 mins with improved standing balance, even releasing one UE from RW to further challenge balance.  Pt ambulated back to room ~100' with RW with CGA.  Pt reports no dizziness this session.  Pt doffed shirt and donned clean shirt with setup.  Therapist assisted with donning abdominal binder.  Pt completed stand pivot transfer to EOB with RW with CGA.  Pt remained at EOB with bed alarm on and all needs in reach. ? ?Therapy Documentation ?Precautions:  ?Precautions ?Precautions: Fall, Other (comment) ?Precaution Comments: Watch BP; has TEDs and abdominal binder, limited R shoulder ROM ?Other Brace: Thigh high TEDs and abdominal binder for BP control ?Restrictions ?Weight Bearing Restrictions: Yes ?RUE Weight Bearing: Weight bearing as tolerated ?Other Position/Activity Restrictions: R wrist fx and L 5th finger fx ? ?Pain: ?Pain Assessment ?Pain Score: 3  ? ? ?Therapy/Group: Individual Therapy ? ?Simonne Come ?04/11/2022, 11:14 AM  ?

## 2022-04-12 NOTE — Progress Notes (Signed)
Physical Therapy Session Note ? ?Patient Details  ?Name: [name] ?MRN: [id number] ?Date of Birth: [birth year] ? ?Today's Date: 04/12/2022 ?PT Individual Time: [phone] ?PT Individual Time Calculation (min): 56 min  ? ?Short Term Goals: ?Week 1:  PT Short Term Goal 1 (Week 1): Pt will perform supine<>sit with supervision ?PT Short Term Goal 2 (Week 1): Pt will perform sit<>stands using LRAD with CGA ?PT Short Term Goal 3 (Week 1): Pt will perform bed<>chair transfers using LRAD with CGA ?PT Short Term Goal 4 (Week 1): Pt will ambulate at least 156f using LRAD with CGA ?PT Short Term Goal 5 (Week 1): Pt will participate in an outcome measure to assess fall risk ? ? ?Skilled Therapeutic Interventions/Progress Updates:  ? ?Pt received sitting in WC and agreeable to PT with hand off from OT.  ? ?Gait training in maxisky with RW x 347fand [id number] assist attempted gait without UE support and unable to advance the RLE  ? ?Step management training in parallel bars. With BUE on Bil rails x 4 on 4inch steps and x 4 on 6inch step and then BUE supported on L side rail to simulate access to home x 4 with 6inch step. Min assist for safety with cues for proper UE placement .  ? ?Patient returned to room and performed ambulatory transfer to recliner with supervision assist and RW for safety x 77f20fBP assessed in sitting 112/63, HR 89. Pt left sitting in recliner with call bell in reach and all needs met.   ? ? ? ? ?   ? ?Therapy Documentation ?Precautions:  ?Precautions ?Precautions: Fall, Other (comment) ?Precaution Comments: Watch BP; has TEDs and abdominal binder, limited R shoulder ROM ?Other Brace: Thigh high TEDs and abdominal binder for BP control ?Restrictions ?Weight Bearing Restrictions: No ?RUE Weight Bearing: Weight bearing as tolerated ?Other Position/Activity Restrictions: R wrist fx and L 5th finger fx ? ?  ?Vital Signs: ?Therapy Vitals ?Temp: 97.8 ?F (36.6 ?C) ?Temp Source: Oral ?Pulse Rate: 95 ?Resp:  16 ?BP: 118/60 ?Patient Position (if appropriate): Lying ?Oxygen Therapy ?SpO2: 96 % ?O2 Device: Room Air ?Pain: ?Pain Assessment ?Pain Scale: 0-10 ?Pain Score: 7  ?Faces Pain Scale: Hurts worst ?Pain Type: Chronic pain ?Pain Location: Generalized ?Pain Descriptors / Indicators: Aching;Pins and needles ?Patients Stated Pain Goal: 2 ?Pain Intervention(s): Medication (See eMAR) ? ? ?Therapy/Group: Individual Therapy ? ?AustLorie Phenix29/2023, 9:55 AM  ?

## 2022-04-12 NOTE — Progress Notes (Signed)
PROGRESS NOTE   Subjective/Complaints: Patient seen today while in therapy.  No acute complaints overnight ate and slept well reports has chronic pain due to neuropathy current level of pain is 6 out of 10.  However she reports that this is adequate pain control for and it allows her to function.  She does have a tremor which is chronic in nature and she does have symptoms of RA in her right hand and wrist she also reports that that is ongoing.  ROS: Patient denies abdominal pain shortness of breath chest pain nausea vomiting constipation diarrhea or visual changes.   Objective:   No results found. No results for input(s): WBC, HGB, HCT, PLT in the last 72 hours. No results for input(s): NA, K, CL, CO2, GLUCOSE, BUN, CREATININE, CALCIUM in the last 72 hours.  Intake/Output Summary (Last 24 hours) at 04/12/2022 1603 Last data filed at 04/12/2022 1303 Gross per 24 hour  Intake 1076 ml  Output --  Net 1076 ml        Physical Exam:  Constitutional: No distress . Vital signs reviewed. HENT: Normocephalic.  Atraumatic.  Conjugate gaze gaze oropharynx moist. Eyes: EOMI. No discharge. Cardiovascular: RRR. No JVD. Respiratory: CTA Bilaterally. Normal effort. Neurological: Alert and oriented x4.  Normal insight and awareness memory is intact normal language and speech cranial nerve exam unremarkable. GI: BS +. Non-distended. MSK: Mild ankle and hand swelling effusions noted skin appears to be having tophi development beginning on third digit of left hand left lateral knee wound clean dry and intact with a dry dressing. Extremities: Trace LE edema.  Has thigh-high TED hose and Ace wraps on both legs. Psychiatric: Normal mood and affect.  Vital Signs Blood pressure (!) 98/57, pulse 97, temperature 98 F (36.7 C), temperature source Oral, resp. rate 18, height 5\' 7"  (1.702 m), weight 76 kg, SpO2 99 %.    Assessment/Plan: 1.  Functional deficits which require 3+ hours per day of interdisciplinary therapy in a comprehensive inpatient rehab setting. Physiatrist is providing close team supervision and 24 hour management of active medical problems listed below. Physiatrist and rehab team continue to assess barriers to discharge/monitor patient progress toward functional and medical goals  Care Tool:  Bathing    Body parts bathed by patient: Right arm, Left arm, Chest, Face, Abdomen, Front perineal area, Right upper leg, Left upper leg, Buttocks   Body parts bathed by helper: Right lower leg, Left lower leg     Bathing assist Assist Level: Minimal Assistance - Patient > 75%     Upper Body Dressing/Undressing Upper body dressing   What is the patient wearing?: Pull over shirt    Upper body assist Assist Level: Set up assist    Lower Body Dressing/Undressing Lower body dressing      What is the patient wearing?: Underwear/pull up, Pants     Lower body assist Assist for lower body dressing: Moderate Assistance - Patient 50 - 74%     Toileting Toileting    Toileting assist Assist for toileting: Minimal Assistance - Patient > 75%     Transfers Chair/bed transfer  Transfers assist     Chair/bed transfer assist level: Contact Guard/Touching  assist Chair/bed transfer assistive device: Armrests, Arboriculturist assist      Assist level: Contact Guard/Touching assist Assistive device: Walker-rolling Max distance: 61ft   Walk 10 feet activity   Assist     Assist level: Contact Guard/Touching assist Assistive device: Walker-rolling   Walk 50 feet activity   Assist    Assist level: Contact Guard/Touching assist Assistive device: Walker-rolling    Walk 150 feet activity   Assist    Assist level: Contact Guard/Touching assist Assistive device: Walker-rolling    Walk 10 feet on uneven surface  activity   Assist     Assist level: Minimal  Assistance - Patient > 75% Assistive device: Walker-rolling   Wheelchair     Assist Is the patient using a wheelchair?: No             Wheelchair 50 feet with 2 turns activity    Assist            Wheelchair 150 feet activity     Assist          Blood pressure (!) 98/57, pulse 97, temperature 98 F (36.7 C), temperature source Oral, resp. rate 18, height  (1.702 m), weight 76 kg, SpO2 99 %.  Medical Problem List and Plan: 1. Functional deficits secondary to debility related to CHF exacerbation/pulmonary emboli/gout/history of CVA.             - Patient may shower             -ELOS/Goals: 5 to 7 days.             -Continue CIR-PT, OT-DC next Wednesday 2.  Antithrombotics: -DVT/anticoagulation: Pharmaceutical: Eliquis             -antiplatelet therapy: Not applicable 3. Pain Management/chronic pain syndrome: Baclofen 5 mg 3 times daily, Neurontin 100 mg 3 times daily, hydrocodone as needed     -4/22-pain controlled continue regimen      -4/23-Pain getting worse again will reduce baclofen to 5 mg twice daily due to renal function.       -4/25-change Norco to Oxy, but did not work we will change back to Norco 50 mg every 6 hours every 6 hours as needed       -4/28 patient did not get meds for pain for 36 hours due to low blood pressure-we will change parameters to hold Norco if BP low since this is chronic       -4/29 Pt reports pain is controlled today.  Continue Norco mg q6hrs. 4. Mood: Cymbalta 60 mg twice daily, melatonin 5 mg nightly       - 4/23-will reduce duloxetine to 60 mg daily due to renal function       - Antipsychotic agents: not applicable 5. Neuropsych: This patient is capable of making decisions on her own behalf. 6. Skin/Wound Care: Routine skin checks 7. Fluids/Electrolytes/Nutrition: Routine in and outs with follow-up chemistries 8.  Acute on chronic on chronic diastolic congestive heart failure.  Follow-up with heart failure team.   Monitor for for any signs of fluid overload.  Continue Demadex 20 mg daily.   -04/08/2022 weight up almost 3 kg in 1 day-we will check and correct and follow trend-we will change fluid restriction to 1800 cc due to positive tenting on exam   -04/10/2022 we will change fluid restriction back to 1500 cc-give Lasix 40 mg twice daily x2 since has fluid overload-educated needs to stick with fluid  restriction educated on CHF versus low BP.  If SOB does not improve by noon will get chest x-ray PA and lateral. -04/11/2022-checks x-ray okay weight down dramatically with Lasix  -4/29: No signs of fluid overload, no edema maintain Demadex 20 mg daily.  Current weight is 76 kg's stable. Filed Weights   04/11/22 0611 04/11/22 1218 04/12/22 0620  Weight: 81.4 kg 76.1 kg 76 kg    9.  Hypotension.  Protamine 10 mg 3 times daily.  Monitor with increased mobility   -04/10/2022 doing Ace wrap's, abdominal binder and thigh-high teds   -04/11/2022 this is chronic will not hold pain meds   -04/12/22 continuing to maintain blood pressure with current pain regimen, lowest BP in the last 24 hours is 98/57. 10.  Gout.  Uric acid 11.1.  Prednisone taper - 04/06/2022-we will give colchicine 1.2 mg and then 0.6 mg later and then cannot repeat for 2 weeks since no allopurinol allopurinol out until gout flare has been resolved - 04/07/22 pain still present today albeit slightly improved, still affecting mobility. - Give prednisone 10 mg x 2 -04/08/22-we will get dietitian consult for patient education please cannot eat -4/29 Gout pain controlled, prednisone and colchicine course completed.  -Continue to monitor. 11.  Restless leg syndrome.  Continue Requip. -4/29 No issues, Requip working well. 12.  OSA.  Continue CPAP 13.  CKD stage III. - 04/07/2022 further improvement of creatinine to 1.83 although BUN slightly elevated  -04/12/22 Continue to monitor. Encourage appropriate fluid intake 14.  Obesity with history of gastric bypass.   BMI 27.62  - 04/05/22 BMI 27.14 15.  Monoclonal lambda light chain.  Follow-up with outpatient hematology 16. Hypokalemia -04/07/2022 at risk because she is on Demadex-3.4 today for potassium -04/10/2022 we will give 40 mEq x 2 because she is receiving additional Lasix 40 mg twice daily x2 17.  Constipation -4/22-LBM 3+ days ago we will give sorbitol cannot push stool out may need enema? -4/24-LBM this morning -4/25 add MiraLAX daily per patient request -4/27. BM yesterday after sorbitol soapsuds enema -4/29 BM today, continue to monitor for constipation 18.  Left knee wound due to diabetes mellitus -4/23 Xeroform added to regiment to help wound heal. -4/29 Xeroform still implemented in regimen 19.  Congestion/cough: Chest clear, weight stable    -Potentially allergies, postnasal drip    -Flonase and cough drops ordered    -4/29 no reports of complaining of cough or allergies today 20.  Left eye scratchiness     -04/08/2022 eyedrops 4 times daily as needed     -4/29 resolved, no complaints or issues 21.  Dizziness/dehydration-     -04/08/2022-we will change fluid restrictions to 1800 cc     -4/28-due to pleural fluid overload yesterday, will change back to 1500 cc     -4/29-no reports today dizziness, continue 1500 cc restriction. 22.  Tremors     -4/25 we will try to keep Keppra to 50 mg twice daily     -4/27 tremors worse today but were better yesterday     -4/29 has tremors today but states that this is about average for her  LOS: 8 days A FACE TO FACE EVALUATION WAS PERFORMED  Tressia Miners, FNP 04/12/2022, 4:03 PM

## 2022-04-12 NOTE — Plan of Care (Signed)
?  Problem: Consults ?Goal: RH GENERAL PATIENT EDUCATION ?Description: See Patient Education module for education specifics. ?Outcome: Progressing ?Goal: Skin Care Protocol Initiated - if Braden Score 18 or less ?Description: If consults are not indicated, leave blank or document N/A ?Outcome: Progressing ?  ?Problem: RH SKIN INTEGRITY ?Goal: RH STG MAINTAIN SKIN INTEGRITY WITH ASSISTANCE ?Description: STG Maintain Skin Integrity With Supervision Assistance. ?Outcome: Progressing ?Goal: RH STG ABLE TO PERFORM INCISION/WOUND CARE W/ASSISTANCE ?Description: STG Able To Perform Incision/Wound Care With Supervision Assistance. ?Outcome: Progressing ?  ?Problem: RH SAFETY ?Goal: RH STG ADHERE TO SAFETY PRECAUTIONS W/ASSISTANCE/DEVICE ?Description: STG Adhere to Safety Precautions With Cues and Reminders. ?Outcome: Progressing ?Goal: RH STG DECREASED RISK OF FALL WITH ASSISTANCE ?Description: STG Decreased Risk of Fall With Supervision Assistance. ?Outcome: Progressing ?  ?Problem: RH PAIN MANAGEMENT ?Goal: RH STG PAIN MANAGED AT OR BELOW PT'S PAIN GOAL ?Description: < 3 on a 0-10 pain scale. ?Outcome: Progressing ?  ?Problem: RH KNOWLEDGE DEFICIT GENERAL ?Goal: RH STG INCREASE KNOWLEDGE OF SELF CARE AFTER HOSPITALIZATION ?Description: Patient will demonstrate knowledge of  self-care management, medication/pain management, skin/wound care, and safety precautions with educational materials and handouts provided by staff independently at discharge. ?Outcome: Progressing ?  ?

## 2022-04-12 NOTE — Discharge Summary (Signed)
Physician Discharge Summary  ?Patient ID: ?Ramonda Galyon Bakken ?MRN: [id number] ?DOB/AGE: 1946/09/17 76 y.o. ? ?Admit date: 04/04/2022 ?Discharge date: 04/16/2021 ? ?Discharge Diagnoses:  ?Principal Problem: ?  Debility ?DVT prophylaxis ?Acute on chronic diastolic congestive heart failure ?Chronic pain syndrome ?Hypotension ?Gout ?Restless leg syndrome ?OSA ?CKD stage III ?Constipation ?Nonessential tremors ?Obesity ?Pulmonary emboli/DVT ?UTI ? ?Discharged Condition: Stable ? ?Significant Diagnostic Studies: ?DG Chest 2 View ? ?Result Date: 04/10/2022 ?CLINICAL DATA:  Chest pain on LEFT EXAM: CHEST - 2 VIEW COMPARISON:  03/30/2022 FINDINGS: Enlargement of cardiac silhouette with loop recorder noted. Mediastinal contours and pulmonary vascularity normal. Chronic bronchitic changes. Lungs grossly clear. No pulmonary infiltrate, pleural effusion, or pneumothorax. RIGHT hilum appears less prominent than on previous exam. Bones demineralized with RIGHT shoulder prosthesis seen. Multiple old LEFT rib fractures. Scattered endplate spur formation thoracic spine. IMPRESSION: Enlargement of cardiac silhouette. Chronic bronchitic changes without acute infiltrate. Electronically Signed   By: Lavonia Dana M.D.   On: 04/10/2022 13:24  ? ?DG Chest 2 View ? ?Result Date: 03/30/2022 ?CLINICAL DATA:  chest pain EXAM: CHEST - 2 VIEW COMPARISON:  CT angiography chest 02/04/2022, chest x-ray 02/04/2022 FINDINGS: Left chest wall wireless cardiac pacemaker. The heart and mediastinal contours are unchanged. Prominent right hilar region. No focal consolidation. Chronic coarsened markings with no overt pulmonary edema. Bilateral pleural effusions. No pneumothorax. No acute osseous abnormality. Reversed total right shoulder arthroplasty. IMPRESSION: 1. Bilateral pleural effusions. 2. Possible mild pulmonary edema. 3. Prominent right hilar region. Underlying mass lesion or lymphadenopathy not excluded. Consider CT chest with intravenous contrast for further  evaluation. Electronically Signed   By: Iven Finn M.D.   On: 03/30/2022 19:10  ? ?DG Ankle Complete Left ? ?Result Date: 04/06/2022 ?CLINICAL DATA:  Bilateral ankle pain EXAM: LEFT ANKLE COMPLETE - 3+ VIEW COMPARISON:  None. FINDINGS: No acute bony abnormality. Specifically, no fracture, subluxation, or dislocation. Plantar and posterior calcaneal spurs. Degenerative changes in the tarsal region. IMPRESSION: No acute bony abnormality. Electronically Signed   By: Rolm Baptise M.D.   On: 04/06/2022 21:13  ? ?DG Ankle Complete Right ? ?Result Date: 04/06/2022 ?CLINICAL DATA:  Bilateral ankle pain EXAM: RIGHT ANKLE - COMPLETE 3+ VIEW COMPARISON:  None. FINDINGS: No acute bony abnormality. Specifically, no fracture, subluxation, or dislocation. Plantar and posterior calcaneal spurs. Degenerative changes in the tarsal region. IMPRESSION: No acute bony abnormality. Electronically Signed   By: Rolm Baptise M.D.   On: 04/06/2022 21:14  ? ?CT Angio Chest Pulmonary Embolism (PE) W or WO Contrast ? ?Result Date: 03/30/2022 ?CLINICAL DATA:  Pulmonary embolism suspected, high probability. EXAM: CT ANGIOGRAPHY CHEST WITH CONTRAST TECHNIQUE: Multidetector CT imaging of the chest was performed using the standard protocol during bolus administration of intravenous contrast. Multiplanar CT image reconstructions and MIPs were obtained to evaluate the vascular anatomy. RADIATION DOSE REDUCTION: This exam was performed according to the departmental dose-optimization program which includes automated exposure control, adjustment of the mA and/or kV according to patient size and/or use of iterative reconstruction technique. CONTRAST:  110m OMNIPAQUE IOHEXOL 350 MG/ML SOLN COMPARISON:  None. FINDINGS: Cardiovascular: The heart is enlarged and there is no pericardial effusion. There is atherosclerotic calcification of the aorta without evidence of aneurysm. The pulmonary trunk is normal in caliber. Small segmental and subsegmental  pulmonary artery filling defects are present in the left lower lobe and right upper lobe. There is no evidence of right heart strain. Mediastinum/Nodes: No enlarged mediastinal, hilar, or axillary lymph nodes. Thyroid gland, trachea, and  esophagus demonstrate no significant findings. Lungs/Pleura: Mild ground-glass opacities are noted bilaterally and predominant at the lung bases. Atelectasis is noted bilaterally. There is a trace right pleural effusion. No pneumothorax. Upper Abdomen: No acute abnormality. Musculoskeletal: Right shoulder arthroplasty changes are noted. Degenerative changes are present in the thoracic spine. Old healed rib fractures are noted on the left. No acute osseous abnormality. Review of the MIP images confirms the above findings. IMPRESSION: 1. Segmental and subsegmental pulmonary artery filling defects in the right upper lobe and left lower lobe. No evidence of right heart strain. 2. Mild ground-glass opacities in the lungs bilaterally, possible edema. 3. Trace right pleural effusion. 4. Aortic atherosclerosis. Critical findings were reported to Dr. Evlyn Courier at 11:39 p.m. Electronically Signed   By: Brett Fairy M.D.   On: 03/30/2022 23:39  ? ?ECHOCARDIOGRAM COMPLETE ? ?Result Date: 03/31/2022 ?   ECHOCARDIOGRAM REPORT   Patient Name:   AVYANNA SPADA Repinski Date of Exam: 03/31/2022 Medical Rec #:  [id number]    Height:       67.0 in Accession #:    [id number]   Weight:       188.1 lb Date of Birth:  February 15, 1946     BSA:          1.970 m? Patient Age:    76 years     BP:           97/65 mmHg Patient Gender: F            HR:           104 bpm. Exam Location:  Inpatient Procedure: 2D Echo, Cardiac Doppler and Color Doppler Indications:    CHF  History:        Patient has prior history of Echocardiogram examinations, most                 recent 02/04/2022. CHF; Risk Factors:Sleep Apnea.  Sonographer:    Jefferey Pica Referring Phys: [id number] LINDSAY NICOLE FINCH IMPRESSIONS  1. Left ventricular ejection  fraction, by estimation, is 55 to 60%. The left ventricle has normal function. The left ventricle has no regional wall motion abnormalities. There is mild left ventricular hypertrophy. Indeterminate diastolic filling due to E-A fusion.  2. Right ventricular systolic function is normal. The right ventricular size is normal. There is mildly elevated pulmonary artery systolic pressure. The estimated right ventricular systolic pressure is 10.6 mmHg.  3. Left atrial size was moderately dilated.  4. Right atrial size was moderately dilated.  5. The mitral valve is normal in structure. Trivial mitral valve regurgitation. No evidence of mitral stenosis.  6. The aortic valve is tricuspid. Aortic valve regurgitation is moderate. Mild to moderate aortic valve stenosis. Aortic valve mean gradient measures 18.0 mmHg, AVA 1.48 cm^2.  7. The inferior vena cava is normal in size with <50% respiratory variability, suggesting right atrial pressure of 8 mmHg. FINDINGS  Left Ventricle: Left ventricular ejection fraction, by estimation, is 55 to 60%. The left ventricle has normal function. The left ventricle has no regional wall motion abnormalities. The left ventricular internal cavity size was normal in size. There is  mild left ventricular hypertrophy. Indeterminate diastolic filling due to E-A fusion. Right Ventricle: The right ventricular size is normal. No increase in right ventricular wall thickness. Right ventricular systolic function is normal. There is mildly elevated pulmonary artery systolic pressure. The tricuspid regurgitant velocity is 2.93  m/s, and with an assumed right atrial pressure of 8 mmHg, the estimated right  ventricular systolic pressure is 59.2 mmHg. Left Atrium: Left atrial size was moderately dilated. Right Atrium: Right atrial size was moderately dilated. Pericardium: There is no evidence of pericardial effusion. Mitral Valve: The mitral valve is normal in structure. Mild mitral annular calcification.  Trivial mitral valve regurgitation. No evidence of mitral valve stenosis. Tricuspid Valve: The tricuspid valve is normal in structure. Tricuspid valve regurgitation is mild. Aortic Valve: The aortic valve is tricuspid.

## 2022-04-12 NOTE — Progress Notes (Signed)
Occupational Therapy Session Note ? ?Patient Details  ?Name: [name] ?MRN: [id number] ?Date of Birth: [birth year] ? ?Today's Date: 04/12/2022 ?OT Individual Time: [phone] ?OT Individual Time Calculation (min): 57 min  ? ? ?Short Term Goals: ?Week 1:  OT Short Term Goal 1 (Week 1): STG = LTG 2/2 ELOS ?OT Short Term Goal 1 - Progress (Week 1): Progressing toward goal ?Week 2:  OT Short Term Goal 1 (Week 2): STG = LTG 2/2 ELOS ? ?Skilled Therapeutic Interventions/Progress Updates:  ?  Patient received sitting edge of bed finishing breakfast.  Patient dressed, and declined shower, or bathing at sink.  Worked to problem solve decreased assistance for donning shoes with AFOs.  Patient with thigh high teds and ace wraps as well for BP management.  Patient able to get shoes over toes with only min assistance, but needed max assist to don AFO and shoe.  (These are trial AFOs - patient is getting an external AFO - so will need practice in donning her particular device [employer].   ?Worked on dynamic standing with arms free of support, worked on functional standing task, and stand endurance this session.   ?Hand off to PT at end of session.  ? ?Therapy Documentation ?Precautions:  ?Precautions ?Precautions: Fall, Other (comment) ?Precaution Comments: Watch BP; has TEDs and abdominal binder, limited R shoulder ROM ?Other Brace: Thigh high TEDs and abdominal binder for BP control ?Restrictions ?Weight Bearing Restrictions: No ?RUE Weight Bearing: Weight bearing as tolerated ?Other Position/Activity Restrictions: R wrist fx and L 5th finger fx ? ?  ?Pain: 6/10 Patient reports she took pain medicine approximately 30 min prior to OT session.  Patient feels good at 6/10 - "I am used to this" ? ? ? ? ?Therapy/Group: Individual Therapy ? ?Mariah Milling ?04/12/2022, 12:21 PM ?

## 2022-04-12 NOTE — Progress Notes (Signed)
Placed patient on CPAP for the night with pressure set at 7cm  

## 2022-04-13 LAB — URINALYSIS, ROUTINE W REFLEX MICROSCOPIC
Bilirubin Urine: NEGATIVE
Glucose, UA: NEGATIVE mg/dL
Ketones, ur: NEGATIVE mg/dL
Nitrite: NEGATIVE
Protein, ur: 30 mg/dL — AB
RBC / HPF: >50 RBC/hpf — ABNORMAL HIGH (ref 0–5)
Specific Gravity, Urine: 1.014 (ref 1.005–1.030)
pH: 5 (ref 5.0–8.0)

## 2022-04-13 MED ORDER — PROPRANOLOL HCL 10 MG PO TABS
10.0000 mg | ORAL_TABLET | Freq: Two times a day (BID) | ORAL | Status: DC
Start: 2022-04-13 — End: 2022-04-13
  Administered 2022-04-13: 10 mg via ORAL
  Filled 2022-04-13: qty 1

## 2022-04-13 MED ORDER — PROPRANOLOL HCL 10 MG PO TABS
10.0000 mg | ORAL_TABLET | Freq: Two times a day (BID) | ORAL | Status: DC
  Administered 2022-04-13 – 2022-04-16 (×6): 10 mg via ORAL
  Filled 2022-04-13 (×6): qty 1

## 2022-04-13 MED ORDER — SORBITOL 70 % SOLN: 15.0000 mL | Freq: Once | Status: DC | PRN

## 2022-04-13 MED ORDER — CEPHALEXIN 250 MG PO CAPS
500.0000 mg | ORAL_CAPSULE | Freq: Two times a day (BID) | ORAL | Status: DC
  Administered 2022-04-13 – 2022-04-16 (×7): 500 mg via ORAL
  Filled 2022-04-13 (×7): qty 2

## 2022-04-13 NOTE — Progress Notes (Signed)
PROGRESS NOTE   Subjective/Complaints: Patient seen today while in therapy.  Patient reports dysuria since yesterday, no incontinence, increased frequency, hesitancy, or visible blood in urine. Has symptoms of RA in her right hand and wrist she also reports that that is ongoing.  ROS: Patient denies abdominal pain shortness of breath chest pain nausea vomiting constipation diarrhea or visual changes.   Objective:   No results found. No results for input(s): WBC, HGB, HCT, PLT in the last 72 hours. No results for input(s): NA, K, CL, CO2, GLUCOSE, BUN, CREATININE, CALCIUM in the last 72 hours.  Intake/Output Summary (Last 24 hours) at 04/13/2022 1040 Last data filed at 04/13/2022 0550 Gross per 24 hour  Intake 240 ml  Output 300 ml  Net -60 ml        Physical Exam:  Constitutional: No distress . Vital signs reviewed. HENT: Normocephalic.  Atraumatic.  Conjugate gaze gaze oropharynx moist. Eyes: EOMI. No discharge. Cardiovascular: RRR. No JVD. Respiratory: CTA Bilaterally. Normal effort. Neurological: Alert and oriented x4.  Normal insight and awareness memory is intact normal language and speech cranial nerve exam unremarkable. GI: BS +. Non-distended. GU: Dysuria since yesterday, no incontinence or visible blood in urine MSK: Mild ankle and hand swelling effusions noted skin appears to be having tophi development beginning on third digit of left hand left lateral knee wound clean dry and intact with a dry dressing. Extremities: Trace LE edema.  Has thigh-high TED hose and Ace wraps on both legs. Psychiatric: Normal mood and affect.  Vital Signs Blood pressure (!) 111/56, pulse 95, temperature 97.9 F (36.6 C), temperature source Oral, resp. rate 16, height 5\' 7"  (1.702 m), weight 76.3 kg, SpO2 99 %.    Assessment/Plan: 1. Functional deficits which require 3+ hours per day of interdisciplinary therapy in a  comprehensive inpatient rehab setting. Physiatrist is providing close team supervision and 24 hour management of active medical problems listed below. Physiatrist and rehab team continue to assess barriers to discharge/monitor patient progress toward functional and medical goals  Care Tool:  Bathing    Body parts bathed by patient: Right arm, Left arm, Chest, Face, Abdomen, Front perineal area, Right upper leg, Left upper leg, Buttocks   Body parts bathed by helper: Right lower leg, Left lower leg     Bathing assist Assist Level: Minimal Assistance - Patient > 75%     Upper Body Dressing/Undressing Upper body dressing   What is the patient wearing?: Pull over shirt    Upper body assist Assist Level: Set up assist    Lower Body Dressing/Undressing Lower body dressing      What is the patient wearing?: Underwear/pull up, Pants     Lower body assist Assist for lower body dressing: Moderate Assistance - Patient 50 - 74%     Toileting Toileting    Toileting assist Assist for toileting: Minimal Assistance - Patient > 75%     Transfers Chair/bed transfer  Transfers assist     Chair/bed transfer assist level: Contact Guard/Touching assist Chair/bed transfer assistive device: Armrests, Geologist, engineering   Ambulation assist      Assist level: Contact Guard/Touching assist Assistive device: Walker-rolling  Max distance: 47ft   Walk 10 feet activity   Assist     Assist level: Contact Guard/Touching assist Assistive device: Walker-rolling   Walk 50 feet activity   Assist    Assist level: Contact Guard/Touching assist Assistive device: Walker-rolling    Walk 150 feet activity   Assist    Assist level: Contact Guard/Touching assist Assistive device: Walker-rolling    Walk 10 feet on uneven surface  activity   Assist     Assist level: Minimal Assistance - Patient > 75% Assistive device: Walker-rolling    Wheelchair     Assist Is the patient using a wheelchair?: No             Wheelchair 50 feet with 2 turns activity    Assist            Wheelchair 150 feet activity     Assist          Blood pressure (!) 111/56, pulse 95, temperature 97.9 F (36.6 C), temperature source Oral, resp. rate 16, height  (1.702 m), weight 76.3 kg, SpO2 99 %.  Medical Problem List and Plan: 1. Functional deficits secondary to debility related to CHF exacerbation/pulmonary emboli/gout/history of CVA.             - Patient may shower             -ELOS/Goals: 5 to 7 days.             -Continue CIR-PT, OT-DC next Wednesday 2.  Antithrombotics: -DVT/anticoagulation: Pharmaceutical: Eliquis             -antiplatelet therapy: Not applicable 3. Pain Management/chronic pain syndrome: Baclofen 5 mg 3 times daily, Neurontin 100 mg 3 times daily, hydrocodone as needed     -4/22-pain controlled continue regimen      -4/23-Pain getting worse again will reduce baclofen to 5 mg twice daily due to renal function.       -4/25-change Norco to Oxy, but did not work we will change back to Norco 50 mg every 6 hours every 6 hours as needed       -4/28 patient did not get meds for pain for 36 hours due to low blood pressure-we will change parameters to hold Norco if BP low since this is chronic       -4/29 Pt reports pain is controlled today.  Continue Norco mg q6hrs. 4. Mood: Cymbalta 60 mg twice daily, melatonin 5 mg nightly       - 4/23-will reduce duloxetine to 60 mg daily due to renal function       - Antipsychotic agents: not applicable 5. Neuropsych: This patient is capable of making decisions on her own behalf. 6. Skin/Wound Care: Routine skin checks 7. Fluids/Electrolytes/Nutrition: Routine in and outs with follow-up chemistries 8.  Acute on chronic on chronic diastolic congestive heart failure.  Follow-up with heart failure team.  Monitor for for any signs of fluid overload.  Continue  Demadex 20 mg daily.   -04/08/2022 weight up almost 3 kg in 1 day-we will check and correct and follow trend-we will change fluid restriction to 1800 cc due to positive tenting on exam   -04/10/2022 we will change fluid restriction back to 1500 cc-give Lasix 40 mg twice daily x2 since has fluid overload-educated needs to stick with fluid restriction educated on CHF versus low BP.  If SOB does not improve by noon will get chest x-ray PA and lateral. -04/11/2022-checks x-ray okay weight down dramatically  with Lasix  -4/29: No signs of fluid overload, no edema maintain Demadex 20 mg daily.  Current weight is 76 kg's stable. Filed Weights   04/11/22 1218 04/12/22 0620 04/13/22 0500  Weight: 76.1 kg 76 kg 76.3 kg    9.  Hypotension.  Protamine 10 mg 3 times daily.  Monitor with increased mobility   -04/10/2022 doing Ace wrap's, abdominal binder and thigh-high teds   -04/11/2022 this is chronic will not hold pain meds   -04/12/22 continuing to maintain blood pressure with current pain regimen, lowest BP in the last 24 hours is 98/57. 04/13/22 B/P stable over past 24 hrs 105/58-111/56 10.  Gout.  Uric acid 11.1.  Prednisone taper - 04/06/2022-we will give colchicine 1.2 mg and then 0.6 mg later and then cannot repeat for 2 weeks since no allopurinol allopurinol out until gout flare has been resolved - 04/07/22 pain still present today albeit slightly improved, still affecting mobility. - Give prednisone 10 mg x 2 -04/08/22-we will get dietitian consult for patient education please cannot eat -4/29 Gout pain controlled, prednisone and colchicine course completed.  -Continue to monitor. 11.  Restless leg syndrome.  Continue Requip. -4/29 No issues, Requip working well. 12.  OSA.  Continue CPAP 13.  CKD stage III. - 04/07/2022 further improvement of creatinine to 1.83 although BUN slightly elevated  -04/12/22 Continue to monitor. Encourage appropriate fluid intake 14.  Obesity with history of gastric bypass.   BMI 27.62  - 04/05/22 BMI 27.14 15.  Monoclonal lambda light chain.  Follow-up with outpatient hematology 16. Hypokalemia -04/07/2022 at risk because she is on Demadex-3.4 today for potassium -04/10/2022 we will give 40 mEq x 2 because she is receiving additional Lasix 40 mg twice daily x2 17.  Constipation -4/22-LBM 3+ days ago we will give sorbitol cannot push stool out may need enema? -4/24-LBM this morning -4/25 add MiraLAX daily per patient request -4/27. BM yesterday after sorbitol soapsuds enema -4/29 BM today, continue to monitor for constipation -4/30 Patient requesting 70 % sorbitol to aid with BM 18.  Left knee wound due to diabetes mellitus -4/23 Xeroform added to regiment to help wound heal. -4/29 Xeroform still implemented in regimen 19.  Congestion/cough: Chest clear, weight stable    -Potentially allergies, postnasal drip    -Flonase and cough drops ordered    -4/29 no reports of complaining of cough or allergies today 20.  Left eye scratchiness     -04/08/2022 eyedrops 4 times daily as needed     -4/29 resolved, no complaints or issues 21.  Dizziness/dehydration-     -04/08/2022-we will change fluid restrictions to 1800 cc     -4/28-due to pleural fluid overload yesterday, will change back to 1500 cc     -4/29-no reports today dizziness, continue 1500 cc restriction.     -4/30 Continue TED hose, and ACE wrap on BLE, no current fluid restriction orders 22.  Tremors     -4/25 we will try to keep Keppra to 50 mg twice daily     -4/27 tremors worse today but were better yesterday     -4/29 has tremors today but states that this is about average for her     -4/30 Reports that tremors are worse today and thinks this will impact her therapy. Added propranolol 10 mg BID to see if this will reduce tremors. Monitor B/P and HR while on propranolol. 23. Burning with urination/ possible UTI -4/30 Symptoms started on 04/12/22, order placed for UA and urine  culture. U/A resulted  showing large amt of Hgb, trace leukocytes, and protein 30.  Discussed with Dr. Wynn Banker, since pt is symptomatic, start treatment with keflex 500 mg BID/ 7 days.  LOS: 9 days A FACE TO FACE EVALUATION WAS PERFORMED  Tressia Miners, FNP 04/13/2022, 10:40 AM

## 2022-04-13 NOTE — Progress Notes (Signed)
Physical Therapy Session Note ? ?Patient Details  ?Name: [name] ?MRN: [id number] ?Date of Birth: 09-22-1946 ? ?Today's Date: 04/13/2022 ?PT Individual Time: 0900-1005 ?PT Individual Time Calculation (min): 65 min  ? ?Short Term Goals: ?Week 1:  PT Short Term Goal 1 (Week 1): Pt will perform supine<>sit with supervision ?PT Short Term Goal 2 (Week 1): Pt will perform sit<>stands using LRAD with CGA ?PT Short Term Goal 3 (Week 1): Pt will perform bed<>chair transfers using LRAD with CGA ?PT Short Term Goal 4 (Week 1): Pt will ambulate at least 126f using LRAD with CGA ?PT Short Term Goal 5 (Week 1): Pt will participate in an outcome measure to assess fall risk ? ?Skilled Therapeutic Interventions/Progress Updates:  ?Pt on toilet with NT, urinating.  Sit> stand using wall bar R hand, RW L hand, CGA. She performed peri care with set up, in standing.  Pt managed panties and pants in standing, R or L hands, using hand rail, with CGA, min assist.  Pt initially had LOB backwards as she lifted 1 hand off of RW. Pt had abdominal binder and bil thigh high TEDS and ACES donned bil LEs. PT donned shoes without AFOs. ? ?Gait in room to sink and wc, RW, CGA.  Hand washing in standing, close supervision.  Gait backwards x 5 ' to wc, CGA. ? ?Kinetron in sitting in wc, at 40 cm/sec x 2 min for quadriceps strengthening, at 30 cm/sec scooted forward in sitting with trunk flexion x 2 min, for gluteal strengthening. ? ?Gait training on level tile , RW, x 85' including turns, CGA.  Min cues for adequate L hip and knee flexion to clear L foot. ? ?Sustained stretch bil heel cords and neuromuscular reeducation for balance reactions, standing with distal 2" of forefeet on blue wedge, with bil UE support x 3 minutes, without UE support x 3 seconds before LOB backwards, x 2.  Pt has LOB backwards when lifting hands off of RW 1st trial, LOB forewards when lifting hands off of RW 2nd trial.  Tirrell and gluteal weakness as well as bil LE  neuropathies influence this.  ? ?At end of session, pt seated in wc with  needs at hand. We discussed safety regarding asking for help before transferring; pt verbalized understanding. ? ?   ? ?Therapy Documentation ?Precautions:  ?Precautions ?Precautions: Fall, Other (comment) ?Precaution Comments: Watch BP; has TEDs and abdominal binder, limited R shoulder ROM ?Other Brace: Thigh high TEDs and abdominal binder for BP control ?Restrictions ?Weight Bearing Restrictions: No ?RUE Weight Bearing: Weight bearing as tolerated ?Other Position/Activity Restrictions: R wrist fx and L 5th finger fx ? ? ?  ? ? ? ?Therapy/Group: Individual Therapy ? ?[name] ?04/13/2022, 12:54 PM  ?

## 2022-04-14 MED ORDER — LORATADINE 10 MG PO TABS
10.0000 mg | ORAL_TABLET | Freq: Every day | ORAL | Status: DC
  Administered 2022-04-14 – 2022-04-16 (×3): 10 mg via ORAL
  Filled 2022-04-14 (×3): qty 1

## 2022-04-14 NOTE — Progress Notes (Signed)
?                                                       PROGRESS NOTE ? ? ?Subjective/Complaints: ? ?Pt reports some tremors still.  ?Balance is still her main issue ?Also happy UTI being treated- already feels better.  ?Also c/o " burning with breathing"- and gets congested in afternoon.  ?Thinks it's allergies, etc. Want to f/u with me.  ? ?ROS: ? ?Pt denies SOB, abd pain, CP, N/V/C/D, and vision changes ? ? ?Objective: ?  ?No results found. ?No results for input(s): WBC, HGB, HCT, PLT in the last 72 hours. ?No results for input(s): NA, K, CL, CO2, GLUCOSE, BUN, CREATININE, CALCIUM in the last 72 hours. ? ?Intake/Output Summary (Last 24 hours) at 04/14/2022 1806 ?Last data filed at 04/14/2022 1259 ?Gross per 24 hour  ?Intake 600 ml  ?Output --  ?Net 600 ml  ?  ? ?  ? ?Physical Exam: ? ? ?General: awake, alert, appropriate, sitting EOB eating breakfast; NAD ?HENT: conjugate gaze; oropharynx moist ?CV: regular rate; no JVD ?Pulmonary: CTA B/L; no W/R/R- good air movement- sounds good ?GI: soft, NT, ND, (+)BS ?Psychiatric: appropriate ?Neurological: Ox3; mild RUE tremor noted when holding glass/drinking ?MSK: Mild ankle and hand swelling effusions noted skin appears to be having tophi development beginning on third digit of left hand left lateral knee wound clean dry and intact with a dry dressing. ?Extremities: Trace LE edema.  Has thigh-high TED hose and Ace wraps on both legs. ?Psychiatric: Normal mood and affect. ? ?Vital Signs ?Blood pressure (!) 110/59, pulse 82, temperature 98.2 ?F (36.8 ?C), resp. rate 18, height '5\' 7"'$  (1.702 m), weight 81.4 kg, SpO2 100 %. ? ? ? ?Assessment/Plan: ?1. Functional deficits which require 3+ hours per day of interdisciplinary therapy in a comprehensive inpatient rehab setting. ?Physiatrist is providing close team supervision and 24 hour management of active medical problems listed below. ?Physiatrist and rehab team continue to assess barriers to discharge/monitor patient progress  toward functional and medical goals ? ?Care Tool: ? ?Bathing ?   ?Body parts bathed by patient: Right arm, Left arm, Chest, Face, Abdomen, Front perineal area, Right upper leg, Left upper leg, Buttocks  ? Body parts bathed by helper: Right lower leg, Left lower leg ?  ?  ?Bathing assist Assist Level: Minimal Assistance - Patient > 75% ?  ?  ?Upper Body Dressing/Undressing ?Upper body dressing   ?What is the patient wearing?: Pull over shirt ?   ?Upper body assist Assist Level: Set up assist ?   ?Lower Body Dressing/Undressing ?Lower body dressing ? ? ?   ?What is the patient wearing?: Underwear/pull up, Pants ? ?  ? ?Lower body assist Assist for lower body dressing: Moderate Assistance - Patient 50 - 74% ?   ? ?Toileting ?Toileting    ?Toileting assist Assist for toileting: Minimal Assistance - Patient > 75% ?  ?  ?Transfers ?Chair/bed transfer ? ?Transfers assist ?   ? ?Chair/bed transfer assist level: Contact Guard/Touching assist ?Chair/bed transfer assistive device: Walker, Armrests ?  ?Locomotion ?Ambulation ? ? ?Ambulation assist ? ?   ? ?Assist level: Contact Guard/Touching assist ?Assistive device: Walker-rolling ?Max distance: 124f  ? ?Walk 10 feet activity ? ? ?Assist ?   ? ?Assist level: Contact Guard/Touching assist ?Assistive device: Walker-rolling  ? ?  Walk 50 feet activity ? ? ?Assist Walk 50 feet with 2 turns activity did not occur: Safety/medical concerns ? ?Assist level: Contact Guard/Touching assist ?Assistive device: Walker-rolling  ? ? ?Walk 150 feet activity ? ? ?Assist   ? ?Assist level: Contact Guard/Touching assist ?Assistive device: Walker-rolling ?  ? ?Walk 10 feet on uneven surface  ?activity ? ? ?Assist   ? ? ?Assist level: Minimal Assistance - Patient > 75% ?Assistive device: Walker-rolling  ? ?Wheelchair ? ? ? ? ?Assist Is the patient using a wheelchair?: No ?  ?  ? ?  ?   ? ? ?Wheelchair 50 feet with 2 turns activity ? ? ? ?Assist ? ?  ?  ? ? ?   ? ?Wheelchair 150 feet activity   ? ? ? ?Assist ?   ? ? ?   ? ?Blood pressure (!) 110/59, pulse 82, temperature 98.2 ?F (36.8 ?C), resp. rate 18, height '5\' 7"'$  (1.702 m), weight 81.4 kg, SpO2 100 %. ? ?Medical Problem List and Plan: ?1. Functional deficits secondary to debility related to CHF exacerbation/pulmonary emboli/gout/history of CVA. ?            - Patient may shower ?            -ELOS/Goals: 5 to 7 days. ?            Continue CIR- PT, OT and SLP ? D/c Wednesday 5/3 ?2.  Antithrombotics: ?-DVT/anticoagulation: Pharmaceutical: Eliquis ?            -antiplatelet therapy: Not applicable ?3. Pain Management/chronic pain syndrome: Baclofen 5 mg 3 times daily, Neurontin 100 mg 3 times daily, hydrocodone as needed ?    -4/22-pain controlled continue regimen ?     -4/23-Pain getting worse again will reduce baclofen to 5 mg twice daily due to renal function. ?      -4/25-change Norco to Oxy, but did not work we will change back to Norco 50 mg every 6 hours every 6 hours as needed ?      -4/28 patient did not get meds for pain for 36 hours due to low blood pressure-we will change parameters to hold Norco if BP low since this is chronic ?    5/1- pain doing ok/stable- con't Norco ?4. Mood: Cymbalta 60 mg twice daily, melatonin 5 mg nightly ?      - 4/23-will reduce duloxetine to 60 mg daily due to renal function ?      - Antipsychotic agents: not applicable ?5. Neuropsych: This patient is capable of making decisions on her own behalf. ?6. Skin/Wound Care: Routine skin checks ?7. Fluids/Electrolytes/Nutrition: Routine in and outs with follow-up chemistries ?8.  Acute on chronic on chronic diastolic congestive heart failure.  Follow-up with heart failure team.  Monitor for for any signs of fluid overload.  Continue Demadex 20 mg daily. ?  -04/08/2022 weight up almost 3 kg in 1 day-we will check and correct and follow trend-we will change fluid restriction to 1800 cc due to positive tenting on exam ?  -04/10/2022 we will change fluid restriction back to  1500 cc-give Lasix 40 mg twice daily x2 since has fluid overload-educated needs to stick with fluid restriction educated on CHF versus low BP.  If SOB does not improve by noon will get chest x-ray PA and lateral. ?-04/11/2022-checks x-ray okay weight down dramatically with Lasix ? -4/29: No signs of fluid overload, no edema maintain Demadex 20 mg daily.  Current weight is 76 kg's  stable. ? 5/1- weight up from 76 to 81 kg in 1 day- unlikely- will recheck in AM and monitor trend ?Filed Weights  ? 04/12/22 0620 04/13/22 0500 04/14/22 0422  ?Weight: 76 kg 76.3 kg 81.4 kg  ?  ?9.  Hypotension.  Protamine 10 mg 3 times daily.  Monitor with increased mobility ?  -04/10/2022 doing Ace wrap's, abdominal binder and thigh-high teds ?  -04/11/2022 this is chronic will not hold pain meds ?  -04/12/22 continuing to maintain blood pressure with current pain regimen, lowest BP in the last 24 hours is 98/57. ?04/13/22 B/P stable over past 24 hrs 105/58-111/56 ?10.  Gout.  Uric acid 11.1.  Prednisone taper ?- 04/06/2022-we will give colchicine 1.2 mg and then 0.6 mg later and then cannot repeat for 2 weeks since no allopurinol allopurinol out until gout flare has been resolved ?- 04/07/22 pain still present today albeit slightly improved, still affecting mobility. ?- Give prednisone 10 mg x 2 ?-04/08/22-we will get dietitian consult for patient education please cannot eat ?-4/29 Gout pain controlled, prednisone and colchicine course completed.  -Continue to monitor. ?11.  Restless leg syndrome.  Continue Requip. ?-4/29 No issues, Requip working well. ?12.  OSA.  Continue CPAP ?13.  CKD stage III. ?- 04/07/2022 further improvement of creatinine to 1.83 although BUN slightly elevated ? -04/12/22 Continue to monitor. Encourage appropriate fluid intake ? 5/1- Will recheck labs in AM ?14.  Obesity with history of gastric bypass.  BMI 27.62 ? - 04/05/22 BMI 27.14 ?15.  Monoclonal lambda light chain.  Follow-up with outpatient hematology ?16.  Hypokalemia ?-04/07/2022 at risk because she is on Demadex-3.4 today for potassium ?-04/10/2022 we will give 40 mEq x 2 because she is receiving additional Lasix 40 mg twice daily x2 ?17.  Constipation ?-4/22-LBM 3+ days

## 2022-04-14 NOTE — Plan of Care (Signed)
?  Problem: Consults ?Goal: RH GENERAL PATIENT EDUCATION ?Description: See Patient Education module for education specifics. ?Outcome: Progressing ?Goal: Skin Care Protocol Initiated - if Braden Score 18 or less ?Description: If consults are not indicated, leave blank or document N/A ?Outcome: Progressing ?  ?Problem: RH SKIN INTEGRITY ?Goal: RH STG MAINTAIN SKIN INTEGRITY WITH ASSISTANCE ?Description: STG Maintain Skin Integrity With Supervision Assistance. ?Outcome: Progressing ?Goal: RH STG ABLE TO PERFORM INCISION/WOUND CARE W/ASSISTANCE ?Description: STG Able To Perform Incision/Wound Care With Supervision Assistance. ?Outcome: Progressing ?  ?Problem: RH SAFETY ?Goal: RH STG ADHERE TO SAFETY PRECAUTIONS W/ASSISTANCE/DEVICE ?Description: STG Adhere to Safety Precautions With Cues and Reminders. ?Outcome: Progressing ?Goal: RH STG DECREASED RISK OF FALL WITH ASSISTANCE ?Description: STG Decreased Risk of Fall With Supervision Assistance. ?Outcome: Progressing ?  ?Problem: RH PAIN MANAGEMENT ?Goal: RH STG PAIN MANAGED AT OR BELOW PT'S PAIN GOAL ?Description: < 3 on a 0-10 pain scale. ?Outcome: Progressing ?  ?Problem: RH KNOWLEDGE DEFICIT GENERAL ?Goal: RH STG INCREASE KNOWLEDGE OF SELF CARE AFTER HOSPITALIZATION ?Description: Patient will demonstrate knowledge of  self-care management, medication/pain management, skin/wound care, and safety precautions with educational materials and handouts provided by staff independently at discharge. ?Outcome: Progressing ?  ?

## 2022-04-14 NOTE — Progress Notes (Signed)
Physical Therapy Weekly Progress Note ? ?Patient Details  ?Name: [name] ?MRN: [id number] ?Date of Birth: August 05, 1946 ? ?Beginning of progress report period: April 05, 2022 ?End of progress report period: Apr 14, 2022 ? ?Today's Date: 04/14/2022 ? ?Patient has met 5 of 5 short term goals.  Pt demonstrates bed mobility with supervision. STS and bed/chair transfers performed with RW CGA consistently, but may occasionally require increased time or multiple attempts to execute d/t impaired sequencing or balance. Pt ambulates 100+ ft with RW CGA. Pt performed outcome measure, 5xSTS to assess fall risk (1 min 16 sec). ? ?Patient continues to demonstrate the following deficits muscle weakness, impaired timing and sequencing and decreased coordination, and peripheral and therefore will continue to benefit from skilled PT intervention to increase functional independence with mobility. ? ?Patient progressing toward long term goals..  Continue plan of care. ? ?PT Short Term Goals ?Week 1:  PT Short Term Goal 1 (Week 1): Pt will perform supine<>sit with supervision ?PT Short Term Goal 1 - Progress (Week 1): Met ?PT Short Term Goal 2 (Week 1): Pt will perform sit<>stands using LRAD with CGA ?PT Short Term Goal 2 - Progress (Week 1): Met ?PT Short Term Goal 3 (Week 1): Pt will perform bed<>chair transfers using LRAD with CGA ?PT Short Term Goal 3 - Progress (Week 1): Met ?PT Short Term Goal 4 (Week 1): Pt will ambulate at least 175f using LRAD with CGA ?PT Short Term Goal 4 - Progress (Week 1): Met ?PT Short Term Goal 5 (Week 1): Pt will participate in an outcome measure to assess fall risk ?PT Short Term Goal 5 - Progress (Week 1): Met ?Week 2:  PT Short Term Goal 1 (Week 2): =LTGs d/t ELOS ? ? ?Therapy Documentation ?Precautions:  ?Precautions ?Precautions: Fall, Other (comment) ?Precaution Comments: Watch BP; has TEDs and abdominal binder, limited R shoulder ROM ?Other Brace: Thigh high TEDs and abdominal binder for BP  control ?Restrictions ?Weight Bearing Restrictions: No ?RUE Weight Bearing: Weight bearing as tolerated ?Other Position/Activity Restrictions: R wrist fx and L 5th finger fx ? ?Therapy/Group: Individual Therapy ? ?RLanetta Inch?04/14/2022, 4:33 PM  ?

## 2022-04-14 NOTE — Progress Notes (Signed)
Physical Therapy Session Note ? ?Patient Details  ?Name: [name] ?MRN: [id number] ?Date of Birth: [birth year] ? ?Today's Date: 04/14/2022 ?PT Individual Time: 0800-0900 ?PT Therapy Time: 60 min   ? ?Short Term Goals: ?Week 1:  PT Short Term Goal 1 (Week 1): Pt will perform supine<>sit with supervision ?PT Short Term Goal 1 - Progress (Week 1): Met ?PT Short Term Goal 2 (Week 1): Pt will perform sit<>stands using LRAD with CGA ?PT Short Term Goal 2 - Progress (Week 1): Met ?PT Short Term Goal 3 (Week 1): Pt will perform bed<>chair transfers using LRAD with CGA ?PT Short Term Goal 3 - Progress (Week 1): Met ?PT Short Term Goal 4 (Week 1): Pt will ambulate at least 138f using LRAD with CGA ?PT Short Term Goal 4 - Progress (Week 1): Met ?PT Short Term Goal 5 (Week 1): Pt will participate in an outcome measure to assess fall risk ?PT Short Term Goal 5 - Progress (Week 1): Met ? ?Skilled Therapeutic Interventions/Progress Updates:  ?Pt received sitting EOB, handed off from nursing after assessing vitals (BP WNL). Pt agreeable to therapy, initiated gait training to therapy gym x120 ft with RW CGA, step-through pattern, slight forward trunk, no LOB noted. Pt required seated rest break between trials throughout session. Stand-pivot transfer with RW CGA to armed chair to initiate 5xSTS outcome measure assessment, performed in 1.16 min; demonstrated decreased ecc control with stand-to-sit, BUE assistance required. Pt reported feeling significantly fatigued and slightly disoriented today d/t recent report of pt having UTI. BP assessed in sitting, after activity (115/56), determined appropriate to continue session. ? ?Cone obstacle/navigation performed with RW CGA x 2 trials (5 cones), demonstrating successful navigation of turns and no LOB noted. Side-stepping with RW CGA and step-ups/downs from Tronix scale performed x2 trials per pt's request to assess weight fluctuations d/t being on fluid-restrictions. Standing dynamic  balance with reaching activity initiated with minimized support on RW CGA to reach outside BOS and toss bean bags, minA to recover mild LOB when passing bean bag from 1 UE to the other without support on RW (2 trials with seated rest break in between). Verbal cuing provided to regain her balance/stability with erect trunk and wider BOS after reaching for a bean bag, before attempting to pass it the other hand. Pt was transported back to her room in w/c for time management. Pt left sitting in w/c with all needs in reach. ? ?Therapy Documentation ?Precautions:  ?Precautions ?Precautions: Fall, Other (comment) ?Precaution Comments: Watch BP; has TEDs and abdominal binder, limited R shoulder ROM ?Other Brace: Thigh high TEDs and abdominal binder for BP control ?Restrictions ?Weight Bearing Restrictions: No ?RUE Weight Bearing: Weight bearing as tolerated ?Other Position/Activity Restrictions: R wrist fx and L 5th finger fx ? ? ? ?Therapy/Group: Individual Therapy ? ?RLanetta Inch?04/14/2022, 4:33 PM  ?

## 2022-04-14 NOTE — Progress Notes (Signed)
Occupational Therapy Session Note ? ?Patient Details  ?Name: [name] ?MRN: [id number] ?Date of Birth: 1946/01/26 ? ?Today's Date: 04/14/2022 ?OT Individual Time: 1031-5945 ?OT Individual Time Calculation (min): 57 min  ? ?Short Term Goals: ?Week 2:  OT Short Term Goal 1 (Week 2): STG = LTG 2/2 ELOS ? ?Skilled Therapeutic Interventions/Progress Updates: ? ?Pt greeted semi-reclined in bed and agreeable to OT treatment session although reporting some dizinnes today. BP's taken as stated below. TED hose donned. Pt completed bed mobility with supervision. Close supervision for sit<>stand w/ RW and bed slightly raised. Pt then ambulated to the wc with RW and CGA. Pt initially wanted to try to stand to wash her hair, but felt she was too dizzy and felt "off." Pt stated she felt it was due to her UTI. Pt still requested to wash hair using wash tray. Pt able to hold wash tray in place while OT assisted with washing hair. Pt then able to dry hair using hair dryer. Pt completed grooming tasks at the sink. OT discussed dc plan, PLOF, and home modifications for safe BADL participation. Pt requested to return to bed and ambulated back to bed with RW and close supervision. Pt able to lift LE's back in bed and left semi-reclined in bed with bed alarm on, call bell in reach and needs met.  ?   Supine: 99/51 ?Sitting: 109/50 ?Standing: 99/852  ? ? ?Therapy Documentation ?Precautions:  ?Precautions ?Precautions: Fall, Other (comment) ?Precaution Comments: Watch BP; has TEDs and abdominal binder, limited R shoulder ROM ?Other Brace: Thigh high TEDs and abdominal binder for BP control ?Restrictions ?Weight Bearing Restrictions: No ?RUE Weight Bearing: Weight bearing as tolerated ?Other Position/Activity Restrictions: R wrist fx and L 5th finger fx ?Pain: ?R wrist pain, rest and repositioned, no number given ? ? ?Therapy/Group: Individual Therapy ? ?Daneen Schick Analisse Randle ?04/14/2022, 10:31 AM ?

## 2022-04-14 NOTE — Progress Notes (Signed)
Physical Therapy Session Note ? ?Patient Details  ?Name: [name] ?MRN: [id number] ?Date of Birth: 02-Jun-1946 ? ?Today's Date: 04/14/2022 ?PT Individual Time: 8527-7824 ?PT Individual Time Calculation (min): 55 min  ? ?Short Term Goals: ?Week 1:  PT Short Term Goal 1 (Week 1): Pt will perform supine<>sit with supervision ?PT Short Term Goal 2 (Week 1): Pt will perform sit<>stands using LRAD with CGA ?PT Short Term Goal 3 (Week 1): Pt will perform bed<>chair transfers using LRAD with CGA ?PT Short Term Goal 4 (Week 1): Pt will ambulate at least 156f using LRAD with CGA ?PT Short Term Goal 5 (Week 1): Pt will participate in an outcome measure to assess fall risk ? ?Skilled Therapeutic Interventions/Progress Updates:  ? Received pt sitting EOB with RN present administering pain medication. Pt agreeable to PT treatment, and reported pain 7/10 all over - repositioning, rest breaks, and activity modification done to reduce pain levels. Thigh high teds and bilateral XTERN AFOs donned throughout session - donned abdominal with max A. Session with emphasis on functional mobility/transfers, generalized strengthening and endurance, dynamic standing balance/coordination, NMR, and gait training. Sit<>stand with RW and CGA from elevated EOB and pt ambulated 1218fx 2 trials with RW and CGA to/from dayroom. Pt then performed sanding alternating toe taps to 6in step 2x10 with BUE support and CGA with emphasis on dynamic standing balance. Worked on blocked practice sit<>stands x 5 reps from elevated EOM - challenged pt to avoid using UEs, however pt ultimately required minimal use of UE when standing but able to go without UE support when sitting. Sit<>stand from elevated EOM with RW and CGA/min A x 6 additional reps and pt worked on the following balance activities: ?-SLS on Airex x30 seconds bilaterally with BUE support and CGA ?-SLS on Airex x15 seconds bilaterally with 1 UE support and min A ?-alternating UE raises from RW while  standing on Airex x10 bilaterally ?-alternating marches 2x10 bilaterally with BUE support  ?-alternating hip abduction 2x10 bilaterally with BUE support  ?-standing mini-squats 2x8 with BUE support ?Pt required multiple rest/water breaks throughout session. BP: 111/58 with exercises and pt reported feeling much better in the afternoons. Returned to room and doffed bilateral XTERN AFOs and donned non-skid socks dependently. Concluded session with pt sitting EOB, needs within reach, and bed alarm on.  ? ?Therapy Documentation ?Precautions:  ?Precautions ?Precautions: Fall, Other (comment) ?Precaution Comments: Watch BP; has TEDs and abdominal binder, limited R shoulder ROM ?Other Brace: Thigh high TEDs and abdominal binder for BP control ?Restrictions ?Weight Bearing Restrictions: No ?RUE Weight Bearing: Weight bearing as tolerated ?Other Position/Activity Restrictions: R wrist fx and L 5th finger fx ? ?Therapy/Group: Individual Therapy ?AnBlenda NicelyAnBecky SaxT, DPT  ?04/14/2022, 7:49 AM  ?

## 2022-04-15 LAB — BASIC METABOLIC PANEL
Anion gap: 11 (ref 5–15)
BUN: 60 mg/dL — ABNORMAL HIGH (ref 8–23)
CO2: 27 mmol/L (ref 22–32)
Calcium: 9.2 mg/dL (ref 8.9–10.3)
Chloride: 97 mmol/L — ABNORMAL LOW (ref 98–111)
Creatinine, Ser: 2.41 mg/dL — ABNORMAL HIGH (ref 0.44–1.00)
GFR, Estimated: 21 mL/min — ABNORMAL LOW (ref >60)
Glucose, Bld: 86 mg/dL (ref 70–99)
Potassium: 3.9 mmol/L (ref 3.5–5.1)
Sodium: 135 mmol/L (ref 135–145)

## 2022-04-15 LAB — URINE CULTURE: Culture: >=100000 — AB

## 2022-04-15 LAB — CBC WITH DIFFERENTIAL/PLATELET
Abs Immature Granulocytes: 0.02 10*3/uL (ref 0.00–0.07)
Basophils Absolute: 0.1 10*3/uL (ref 0.0–0.1)
Basophils Relative: 1 %
Eosinophils Absolute: 0.5 10*3/uL (ref 0.0–0.5)
Eosinophils Relative: 9 %
HCT: 27.8 % — ABNORMAL LOW (ref 36.0–46.0)
Hemoglobin: 9.2 g/dL — ABNORMAL LOW (ref 12.0–15.0)
Immature Granulocytes: 0 %
Lymphocytes Relative: 27 %
Lymphs Abs: 1.4 10*3/uL (ref 0.7–4.0)
MCH: 33 pg (ref 26.0–34.0)
MCHC: 33.1 g/dL (ref 30.0–36.0)
MCV: 99.6 fL (ref 80.0–100.0)
Monocytes Absolute: 0.4 10*3/uL (ref 0.1–1.0)
Monocytes Relative: 8 %
Neutro Abs: 2.9 10*3/uL (ref 1.7–7.7)
Neutrophils Relative %: 55 %
Platelets: 262 10*3/uL (ref 150–400)
RBC: 2.79 MIL/uL — ABNORMAL LOW (ref 3.87–5.11)
RDW: 13.6 % (ref 11.5–15.5)
WBC: 5.3 10*3/uL (ref 4.0–10.5)
nRBC: 0 % (ref 0.0–0.2)

## 2022-04-15 MED ORDER — VITAMIN D (ERGOCALCIFEROL) 1.25 MG (50000 UNIT) PO CAPS: 50000.0000 [IU] | ORAL_CAPSULE | ORAL | 1 refills | Status: AC

## 2022-04-15 MED ORDER — LORATADINE 10 MG PO TABS: 10.0000 mg | ORAL_TABLET | Freq: Every day | ORAL | 0 refills | Status: AC

## 2022-04-15 MED ORDER — HYDROCODONE-ACETAMINOPHEN 7.5-325 MG PO TABS: 2.0000 | ORAL_TABLET | Freq: Four times a day (QID) | ORAL | 0 refills | Status: AC | PRN

## 2022-04-15 MED ORDER — PROPRANOLOL HCL 10 MG PO TABS
10.0000 mg | ORAL_TABLET | Freq: Two times a day (BID) | ORAL | 0 refills | Status: DC
Start: 2022-04-15 — End: 2022-04-18

## 2022-04-15 MED ORDER — GABAPENTIN 100 MG PO CAPS
100.0000 mg | ORAL_CAPSULE | Freq: Three times a day (TID) | ORAL | 0 refills | Status: AC
Start: 2022-04-15 — End: ?

## 2022-04-15 MED ORDER — SODIUM CHLORIDE 0.9 % IV SOLN: INTRAVENOUS | Status: AC

## 2022-04-15 MED ORDER — CEPHALEXIN 500 MG PO CAPS: 500.0000 mg | ORAL_CAPSULE | Freq: Two times a day (BID) | ORAL | 0 refills | Status: AC

## 2022-04-15 MED ORDER — CALCIUM CARBONATE 1250 (500 CA) MG PO CHEW
1.0000 | CHEWABLE_TABLET | Freq: Every day | ORAL | 0 refills | Status: AC
Start: 2022-04-15 — End: ?

## 2022-04-15 MED ORDER — PROAIR HFA 108 (90 BASE) MCG/ACT IN AERS
1.0000 | INHALATION_SPRAY | Freq: Four times a day (QID) | RESPIRATORY_TRACT | 5 refills | Status: AC | PRN
Start: 2022-04-15 — End: ?

## 2022-04-15 MED ORDER — TORSEMIDE 20 MG PO TABS: 20.0000 mg | ORAL_TABLET | Freq: Every day | ORAL | 2 refills | Status: AC

## 2022-04-15 MED ORDER — LEVETIRACETAM 250 MG PO TABS: 250.0000 mg | ORAL_TABLET | Freq: Two times a day (BID) | ORAL | 0 refills | Status: AC

## 2022-04-15 MED ORDER — NITROGLYCERIN 0.4 MG SL SUBL: 0.4000 mg | SUBLINGUAL_TABLET | SUBLINGUAL | 0 refills | Status: AC | PRN

## 2022-04-15 MED ORDER — MIDODRINE HCL 10 MG PO TABS: 10.0000 mg | ORAL_TABLET | Freq: Three times a day (TID) | ORAL | 2 refills | Status: AC

## 2022-04-15 MED ORDER — APIXABAN 5 MG PO TABS
5.0000 mg | ORAL_TABLET | Freq: Two times a day (BID) | ORAL | 0 refills | Status: AC
Start: 2022-04-15 — End: ?

## 2022-04-15 MED ORDER — ESOMEPRAZOLE MAGNESIUM 40 MG PO CPDR: 40.0000 mg | DELAYED_RELEASE_CAPSULE | Freq: Two times a day (BID) | ORAL | 6 refills | Status: AC

## 2022-04-15 MED ORDER — MECLIZINE HCL 12.5 MG PO TABS
12.5000 mg | ORAL_TABLET | Freq: Three times a day (TID) | ORAL | 0 refills | Status: AC
Start: 2022-04-15 — End: ?

## 2022-04-15 MED ORDER — MELATONIN 5 MG PO TABS: 5.0000 mg | ORAL_TABLET | Freq: Every day | ORAL | 0 refills | Status: AC

## 2022-04-15 MED ORDER — BACLOFEN 5 MG PO TABS: 5.0000 mg | ORAL_TABLET | Freq: Two times a day (BID) | ORAL | 0 refills | Status: AC

## 2022-04-15 MED ORDER — DULOXETINE HCL 60 MG PO CPEP: 60.0000 mg | ORAL_CAPSULE | Freq: Every day | ORAL | 3 refills | Status: AC

## 2022-04-15 MED ORDER — POLYETHYLENE GLYCOL 3350 17 G PO PACK: 17.0000 g | PACK | Freq: Two times a day (BID) | ORAL | 0 refills | Status: AC

## 2022-04-15 NOTE — Progress Notes (Signed)
Occupational Therapy Session Note ? ?Patient Details  ?Name: Marializ Ferrebee Heatley ?MRN: [id number] ?Date of Birth: Sep 06, 1946 ? ?Today's Date: 04/15/2022 ?OT Individual Time: 7680-8811 ?OT Individual Time Calculation (min): 55 min  ? ? ?Short Term Goals: ?Week 2:  OT Short Term Goal 1 (Week 2): STG = LTG 2/2 ELOS ? ?Skilled Therapeutic Interventions/Progress Updates:  ?  Pt greeted seated in bathroom after successful BM. Pt able to assist with peri-care, then ambulated to the tub bench in shower with close supervision. Pt doffed clothing and can do this with reacher, which she has at home and was using PTA. Bathing completed with use of LH sponge and grab bars for sit<>stand with close supervision and increased time. Stand-pivot to wc after shower with supervision. OT changed out bandages on L LE, then educated on use of friction reducing bag to don TED hose. Pt also could achieve figure 4 position with LLE to help thread pant legs. Close supervision for sit<>stands at the sink to pull up pants. Pt completed grooming tasks seated in wc at the sink and tolerated blow drying hair without assistance. Pt left seated in wc at the sink with nursing present and needs met.  ? ?Therapy Documentation ?Precautions:  ?Precautions ?Precautions: Fall, Other (comment) ?Precaution Comments: Watch BP; has TEDs and abdominal binder, limited R shoulder ROM ?Other Brace: Thigh high TEDs and abdominal binder for BP control ?Restrictions ?Weight Bearing Restrictions: No ?RUE Weight Bearing: Weight bearing as tolerated ?Other Position/Activity Restrictions: R wrist fx and L 5th finger fx ? ?Pain: ?Pain Assessment ?Pain Scale: 0-10 ?Pain Score: 8  ?Pain Location: Generalized ?Pain Intervention(s): Medication (See eMAR) ? ? ? ?Therapy/Group: Individual Therapy ? ?Daneen Schick Kwabena Strutz ?04/15/2022, 9:01 AM ?

## 2022-04-15 NOTE — Progress Notes (Signed)
Occupational Therapy Discharge Summary ? ?Patient Details  ?Name: [name] ?MRN: [id number] ?Date of Birth: [birth year] ? ?Today's Date: 04/15/2022 ?OT Individual Time: 1245-8099 ?OT Individual Time Calculation (min): 75 min  ? ?Pt greeted seated EOB. Treatment sessio focused on dc planning, home modifications for safety, LB dressing strategies and adaptive equipment recommendations. Blocked practice for working through donning and doffing shoes and AFO. OT educated on use of sock-aid with pt demonstrating understanding. OT also demonstrated use of extension toilet aid and found on [facility] for patient. Educated on R shoulder exercises, then ambulated in hallway with supervision. Pt left seated in wc at end of session with alarm belt on and needs met.  ? ? ?Patient has met 5 of 9 long term goals due to improved activity tolerance, improved balance, postural control, ability to compensate for deficits, improved awareness, and improved coordination.  Patient to discharge at overall Supervision level.  Patient's care partner is independent to provide the necessary physical assistance at discharge.   ? ?Reasons goals not met: Patient still requires supervision for safety for all transfers and sit<>stands. Balance has greatly improved, but she still needs supervision for safety due to falls risk. ? ?Recommendation:  ?Patient will benefit from ongoing skilled OT services in home health setting to continue to advance functional skills in the area of BADL. ? ?Equipment: ?RW ? ?Reasons for discharge: treatment goals met and discharge from hospital ? ?Patient/family agrees with progress made and goals achieved: Yes ? ?OT Discharge ?Precautions/Restrictions  ?Precautions ?Precautions: Fall;Other (comment) ?Precaution Comments: Watch BP; has TEDs and abdominal binder, limited R shoulder ROM ?Other Brace: Thigh high TEDs and abdominal binder for BP control ?Restrictions ?Weight Bearing Restrictions: No ?RUE Weight Bearing: Weight  bearing as tolerated ?Other Position/Activity Restrictions: R wrist fx and L 5th finger fx ?Pain ?Pain Assessment ?Pain Scale: 0-10 ?Pain Score: 9  ?Pain Location: Arm ?Pain Orientation: Right;Left ?Pain Intervention(s): Medication (See eMAR) ?ADL ?ADL ?Eating: Set up ?Where Assessed-Eating: Chair ?Grooming: Modified independent ?Where Assessed-Grooming: Standing at sink ?Upper Body Bathing: Supervision/safety ?Where Assessed-Upper Body Bathing: Shower ?Lower Body Bathing: Supervision/safety ?Where Assessed-Lower Body Bathing: Shower ?Upper Body Dressing: Supervision/safety ?Where Assessed-Upper Body Dressing: Edge of bed ?Lower Body Dressing: Supervision/safety ?Where Assessed-Lower Body Dressing: Edge of bed ?Toileting: Supervision/safety ?Where Assessed-Toileting: Toilet ?Toilet Transfer: Close supervision ?Toilet Transfer Method: Ambulating ?Science writer: Bedside commode, Grab bars ?Tub/Shower Transfer: Unable to assess ?Walk-In Shower Transfer: Close supervision ?Walk-In Shower Transfer Method: Ambulating ?Vision ?Eye Alignment: Within Functional Limits ?Perception  ?Perception: Within Functional Limits ?Praxis ?Praxis: Intact ?Cognition ?Cognition ?Overall Cognitive Status: Within Functional Limits for tasks assessed ?Arousal/Alertness: Awake/alert ?Orientation Level: Person;Place;Situation ?Person: Oriented ?Place: Oriented ?Situation: Oriented ?Attention: Focused;Sustained ?Focused Attention: Appears intact ?Sustained Attention: Appears intact ?Sustained Attention Impairment: Verbal complex;Functional complex ?Selective Attention: Appears intact ?Awareness: Appears intact ?Awareness Impairment: Emergent impairment;Anticipatory impairment ?Problem Solving: Appears intact ?Problem Solving Impairment: Verbal complex;Functional complex ?Safety/Judgment: Appears intact ?Comments: Some STM deficits noted, but grossly WFL for tasks assessed ?Brief Interview for Mental Status (BIMS) ?Repetition of Three  Words (First Attempt): 3 ?Temporal Orientation: Year: Correct ?Temporal Orientation: Month: Accurate within 5 days ?Temporal Orientation: Day: Correct ?Recall: "Sock": Yes, after cueing ("something to wear") ?Recall: "Blue": Yes, no cue required ?Recall: "Bed": Yes, no cue required ?BIMS Summary Score: 14 ?Sensation ?Sensation ?Central sensation comments: Numbness and tingling BLE and BUE, hx of neuropathy ?Hot/Cold:  (pt reports she is unable to feel hot/cold when washing hands) ?Coordination ?Gross Motor Movements are Fluid and Coordinated:  No ?Fine Motor Movements are Fluid and Coordinated: No ?Coordination and Movement Description: generalized deconditioning ?Finger Nose Finger Test: baseline shoulder ROM limitations on R ?Motor  ?Motor ?Motor - Discharge Observations: generalized deconditioning ?Mobility  ?Transfers ?Sit to Stand: Supervision/Verbal cueing ?Stand to Sit: Supervision/Verbal cueing  ?Trunk/Postural Assessment  ?Cervical Assessment ?Cervical Assessment: Exceptions to [facility] (forward head with decreased cervical rotation ROM) ?Thoracic Assessment ?Thoracic Assessment: Exceptions to [facility] (thoracic rounding) ?Lumbar Assessment ?Lumbar Assessment: Exceptions to [facility] (posterior pelvic tilt)  ?Balance ?Static Sitting Balance ?Static Sitting - Balance Support: Feet supported ?Static Sitting - Level of Assistance: 6: Modified independent (Device/Increase time) ?Dynamic Sitting Balance ?Dynamic Sitting - Balance Support: Feet supported ?Dynamic Sitting - Level of Assistance: 5: Stand by assistance ?Static Standing Balance ?Static Standing - Balance Support: During functional activity;Bilateral upper extremity supported ?Static Standing - Level of Assistance: 5: Stand by assistance ?Dynamic Standing Balance ?Dynamic Standing - Level of Assistance: 5: Stand by assistance (CGA) ?Extremity/Trunk Assessment ?RUE Assessment ?RUE Assessment: Exceptions to [facility] ?Active Range of Motion (AROM) Comments: 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 R wrist fx ?RUE Body System: Ortho ?LUE Assessment ?LUE Assessment: Within Functional Limits ? ? ?Daneen Schick Migdalia Olejniczak ?04/15/2022, 3:30 PM ?

## 2022-04-15 NOTE — Progress Notes (Signed)
Physical Therapy Session Note ? ?Patient Details  ?Name: [name] ?MRN: [id number] ?Date of Birth: [birth year] ? ?Today's Date: 04/15/2022 ?PT Individual Time: 7616-0737 ?PT Individual Time Calculation (min): 57 min  ? ?Short Term Goals: ?Week 1:  PT Short Term Goal 1 (Week 1): Pt will perform supine<>sit with supervision ?PT Short Term Goal 1 - Progress (Week 1): Met ?PT Short Term Goal 2 (Week 1): Pt will perform sit<>stands using LRAD with CGA ?PT Short Term Goal 2 - Progress (Week 1): Met ?PT Short Term Goal 3 (Week 1): Pt will perform bed<>chair transfers using LRAD with CGA ?PT Short Term Goal 3 - Progress (Week 1): Met ?PT Short Term Goal 4 (Week 1): Pt will ambulate at least 137f using LRAD with CGA ?PT Short Term Goal 4 - Progress (Week 1): Met ?PT Short Term Goal 5 (Week 1): Pt will participate in an outcome measure to assess fall risk ?PT Short Term Goal 5 - Progress (Week 1): Met ?Week 2:  PT Short Term Goal 1 (Week 2): =LTGs d/t ELOS ? ?Skilled Therapeutic Interventions/Progress Updates:  ? [facility]. Pt agreeable to PT treatment and reported pain 8/10 all over (premedicated). Thigh high teds, abdominal binder, and XTERN AFOs donned throughout session. Adjusted pt's RW and replaced rubber stoppers with plastic ones that slide. Pt then reported onset of dizziness, worse today > yesterday - BP: 115/52 seated in WC. Session with emphasis on functional mobility/transfers, generalized strengthening and endurance, and gait training. Provided pt with information for chair yoga per request of primary PT. Sit<>stand with RW and close supervision and pt ambulated 1270fx 2 trials with RW and close supervision to/from dayroom (pt required more standing rest breaks when returning to room due to fatigue). Took seated rest break upon entering dayroom due to dizziness and adjusted abdominal binder for comfort. Pt ambulated to Nustep and performed seated BUE/LE  strengthening on Nustep at workload 3 for 10 minutes for a total of 658 steps (steps/min >60) with emphasis on cardiovascular endurance. BP after activity: 116/55 and pt reported feeling much better. Stand<>pivot Nustep<>WC with RW and CGA, then [facility] and CGA and picked up tissue box from floor using reacher and CGA. Upon returning to room pt requested to sit EOB in preparation for IV. Doffed AFOs and ace wraps with total A. Concluded session with pt sitting EOB, needs within reach, and bed alarm on speaking with nutrition staff.  ? ?Therapy Documentation ?Precautions:  ?Precautions ?Precautions: Fall, Other (comment) ?Precaution Comments: Watch BP; has TEDs and abdominal binder, limited R shoulder ROM ?Other Brace: Thigh high TEDs and abdominal binder for BP control ?Restrictions ?Weight Bearing Restrictions: No ?RUE Weight Bearing: Weight bearing as tolerated ?Other Position/Activity Restrictions: R wrist fx and L 5th finger fx ? ?Therapy/Group: Individual Therapy ?AnBlenda NicelyAnBecky SaxT, DPT  ?04/15/2022, 7:29 AM  ?

## 2022-04-15 NOTE — Plan of Care (Signed)
?  Problem: Sit to Stand ?Goal: LTG:  Patient will perform sit to stand in prep for activites of daily living with assistance level (OT) ?Description: LTG:  Patient will perform sit to stand in prep for activites of daily living with assistance level (OT) ?Outcome: Not Met (add Reason) ?Flowsheets (Taken 04/15/2022 1515) ?LTG: PT will perform sit to stand in prep for activites of daily living with assistance level: Supervision/Verbal cueing ?Note: Supervision for sit<>stands ?  ?Problem: RH Grooming ?Goal: LTG Patient will perform grooming w/assist,cues/equip (OT) ?Description: LTG: Patient will perform grooming with assist, with/without cues using equipment (OT) ?Outcome: Completed/Met ?  ?Problem: RH Bathing ?Goal: LTG Patient will bathe all body parts with assist levels (OT) ?Description: LTG: Patient will bathe all body parts with assist levels (OT) ?Outcome: Not Met (add Reason) ?Flowsheets (Taken 04/15/2022 1515) ?LTG: Pt will perform bathing with assistance level/cueing: Supervision/Verbal cueing ?Note: Supervision for bathing tasks ?  ?Problem: RH Dressing ?Goal: LTG Patient will perform upper body dressing (OT) ?Description: LTG Patient will perform upper body dressing with assist, with/without cues (OT). ?Outcome: Completed/Met ?Goal: LTG Patient will perform lower body dressing w/assist (OT) ?Description: LTG: Patient will perform lower body dressing with assist, with/without cues in positioning using equipment (OT) ?Outcome: Completed/Met ?  ?Problem: RH Toileting ?Goal: LTG Patient will perform toileting task (3/3 steps) with assistance level (OT) ?Description: LTG: Patient will perform toileting task (3/3 steps) with assistance level (OT)  ?Outcome: Not Met (add Reason) ?Note: Supervision for toileting tasks ?  ?Problem: RH Simple Meal Prep ?Goal: LTG Patient will perform simple meal prep w/assist (OT) ?Description: LTG: Patient will perform simple meal prep with assistance, with/without cues  (OT). ?Outcome: Completed/Met ?  ?Problem: RH Toilet Transfers ?Goal: LTG Patient will perform toilet transfers w/assist (OT) ?Description: LTG: Patient will perform toilet transfers with assist, with/without cues using equipment (OT) ?Outcome: Not Met (add Reason) ?Note: Supervision for shower transfers ?  ?Problem: RH Tub/Shower Transfers ?Goal: LTG Patient will perform tub/shower transfers w/assist (OT) ?Description: LTG: Patient will perform tub/shower transfers with assist, with/without cues using equipment (OT) ?Outcome: Completed/Met ?  ?Problem: RH Furniture Transfers ?Goal: LTG Patient will perform furniture transfers w/assist (OT/PT) ?Description: LTG: Patient will perform furniture transfers  with assistance (OT/PT). ?Outcome: Completed/Met ?  ?

## 2022-04-15 NOTE — Progress Notes (Signed)
?                                                       PROGRESS NOTE ? ? ?Subjective/Complaints: ? ?Pt reports nebs help so much with congestion/breathing.  ?Ate 100% tray.  ?Asking about ozempic- explained will need to get from PCP- don't think insurance would cover since not DM.  ? ?AFOs helped her walk so much, she's a "poster child".  ? ?Needs nebs machine to go home with.  ? ?ROS: ? ?Pt denies SOB, abd pain, CP, N/V/C/D, and vision changes ? ? ?Objective: ?  ?No results found. ?Recent Labs  ?  04/15/22 ?8338  ?WBC 5.3  ?HGB 9.2*  ?HCT 27.8*  ?PLT 262  ? ?Recent Labs  ?  04/15/22 ?2505  ?NA 135  ?K 3.9  ?CL 97*  ?CO2 27  ?GLUCOSE 86  ?BUN 60*  ?CREATININE 2.41*  ?CALCIUM 9.2  ? ? ?Intake/Output Summary (Last 24 hours) at 04/15/2022 0909 ?Last data filed at 04/15/2022 3976 ?Gross per 24 hour  ?Intake 596 ml  ?Output --  ?Net 596 ml  ?  ? ?  ? ?Physical Exam: ? ? ? ?General: awake, alert, appropriate, sitting EOB, finished tray;  NAD ?HENT: conjugate gaze; oropharynx moist ?CV: regular rate; no JVD ?Pulmonary: CTA B/L; no W/R/R- good air movement ?GI: soft, NT, ND, (+)BS ?Psychiatric: appropriate ?Neurological: Ox3 ?Mild tremor noted still ?Extremities; (+) tenting ?MSK: Mild ankle and hand swelling effusions noted skin appears to be having tophi development beginning on third digit of left hand left lateral knee wound clean dry and intact with a dry dressing. ?Extremities: Trace LE edema.  Has thigh-high TED hose and Ace wraps on both legs. ?Psychiatric: Normal mood and affect. ?Skin- skin tear that's bleeding R lateral elbow ? ? ?Vital Signs ?Blood pressure 117/63, pulse 86, temperature 98 ?F (36.7 ?C), temperature source Oral, resp. rate 18, height '5\' 7"'$  (1.702 m), weight 80.7 kg, SpO2 100 %. ? ? ? ?Assessment/Plan: ?1. Functional deficits which require 3+ hours per day of interdisciplinary therapy in a comprehensive inpatient rehab setting. ?Physiatrist is providing close team supervision and 24 hour management  of active medical problems listed below. ?Physiatrist and rehab team continue to assess barriers to discharge/monitor patient progress toward functional and medical goals ? ?Care Tool: ? ?Bathing ?   ?Body parts bathed by patient: Right arm, Left arm, Chest, Face, Abdomen, Front perineal area, Right upper leg, Left upper leg, Buttocks  ? Body parts bathed by helper: Right lower leg, Left lower leg ?  ?  ?Bathing assist Assist Level: Minimal Assistance - Patient > 75% ?  ?  ?Upper Body Dressing/Undressing ?Upper body dressing   ?What is the patient wearing?: Pull over shirt ?   ?Upper body assist Assist Level: Set up assist ?   ?Lower Body Dressing/Undressing ?Lower body dressing ? ? ?   ?What is the patient wearing?: Underwear/pull up, Pants ? ?  ? ?Lower body assist Assist for lower body dressing: Moderate Assistance - Patient 50 - 74% ?   ? ?Toileting ?Toileting    ?Toileting assist Assist for toileting: Minimal Assistance - Patient > 75% ?  ?  ?Transfers ?Chair/bed transfer ? ?Transfers assist ?   ? ?Chair/bed transfer assist level: Contact Guard/Touching assist ?Chair/bed transfer assistive device: Walker, Armrests ?  ?  Locomotion ?Ambulation ? ? ?Ambulation assist ? ?   ? ?Assist level: Contact Guard/Touching assist ?Assistive device: Walker-rolling ?Max distance: 154f  ? ?Walk 10 feet activity ? ? ?Assist ?   ? ?Assist level: Contact Guard/Touching assist ?Assistive device: Walker-rolling  ? ?Walk 50 feet activity ? ? ?Assist Walk 50 feet with 2 turns activity did not occur: Safety/medical concerns ? ?Assist level: Contact Guard/Touching assist ?Assistive device: Walker-rolling  ? ? ?Walk 150 feet activity ? ? ?Assist   ? ?Assist level: Contact Guard/Touching assist ?Assistive device: Walker-rolling ?  ? ?Walk 10 feet on uneven surface  ?activity ? ? ?Assist   ? ? ?Assist level: Minimal Assistance - Patient > 75% ?Assistive device: Walker-rolling  ? ?Wheelchair ? ? ? ? ?Assist Is the patient using a  wheelchair?: No ?  ?  ? ?  ?   ? ? ?Wheelchair 50 feet with 2 turns activity ? ? ? ?Assist ? ?  ?  ? ? ?   ? ?Wheelchair 150 feet activity  ? ? ? ?Assist ?   ? ? ?   ? ?Blood pressure 117/63, pulse 86, temperature 98 ?F (36.7 ?C), temperature source Oral, resp. rate 18, height '5\' 7"'$  (1.702 m), weight 80.7 kg, SpO2 100 %. ? ?Medical Problem List and Plan: ?1. Functional deficits secondary to debility related to CHF exacerbation/pulmonary emboli/gout/history of CVA. ?            - Patient may shower ?            -ELOS/Goals: 5 to 7 days. ?           Continue CIR- PT, OT and SLP ? Team conference today to finalize d/c ? D/c tomorrow- will give 500cc IVFs so don't fluid overload her- since Cr up to 2.4 ?2.  Antithrombotics: ?-DVT/anticoagulation: Pharmaceutical: Eliquis ?            -antiplatelet therapy: Not applicable ?3. Pain Management/chronic pain syndrome: Baclofen 5 mg 3 times daily, Neurontin 100 mg 3 times daily, hydrocodone as needed ?    -4/22-pain controlled continue regimen ?     -4/23-Pain getting worse again will reduce baclofen to 5 mg twice daily due to renal function. ?      -4/25-change Norco to Oxy, but did not work we will change back to Norco 50 mg every 6 hours every 6 hours as needed ?      -4/28 patient did not get meds for pain for 36 hours due to low blood pressure-we will change parameters to hold Norco if BP low since this is chronic ?    5/1- pain doing ok/stable- con't Norco ?4. Mood: Cymbalta 60 mg twice daily, melatonin 5 mg nightly ?      - 4/23-will reduce duloxetine to 60 mg daily due to renal function ?      - Antipsychotic agents: not applicable ?5. Neuropsych: This patient is capable of making decisions on her own behalf. ?6. Skin/Wound Care: Routine skin checks ?7. Fluids/Electrolytes/Nutrition: Routine in and outs with follow-up chemistries ? 5/2- will give 500cc NS IVFs since Cr up to 2.4- before leaves tomorrow ?8.  Acute on chronic on chronic diastolic congestive heart  failure.  Follow-up with heart failure team.  Monitor for for any signs of fluid overload.  Continue Demadex 20 mg daily. ?  -04/08/2022 weight up almost 3 kg in 1 day-we will check and correct and follow trend-we will change fluid restriction to 1800 cc due to  positive tenting on exam ?  -04/10/2022 we will change fluid restriction back to 1500 cc-give Lasix 40 mg twice daily x2 since has fluid overload-educated needs to stick with fluid restriction educated on CHF versus low BP.  If SOB does not improve by noon will get chest x-ray PA and lateral. ?-04/11/2022-checks x-ray okay weight down dramatically with Lasix ? -4/29: No signs of fluid overload, no edema maintain Demadex 20 mg daily.  Current weight is 76 kg's stable. ? 5/1- weight up from 76 to 81 kg in 1 day- unlikely- will recheck in AM and monitor trend ? 5/2- weight back down some- will monitor at home daily after d/c.  ?Filed Weights  ? 04/13/22 0500 04/14/22 0422 04/15/22 0500  ?Weight: 76.3 kg 81.4 kg 80.7 kg  ?  ?9.  Hypotension.  Protamine 10 mg 3 times daily.  Monitor with increased mobility ?  -04/10/2022 doing Ace wrap's, abdominal binder and thigh-high teds ?  -04/11/2022 this is chronic will not hold pain meds ?  -04/12/22 continuing to maintain blood pressure with current pain regimen, lowest BP in the last 24 hours is 98/57. ?04/13/22 B/P stable over past 24 hrs 105/58-111/56 ?10.  Gout.  Uric acid 11.1.  Prednisone taper ?- 04/06/2022-we will give colchicine 1.2 mg and then 0.6 mg later and then cannot repeat for 2 weeks since no allopurinol allopurinol out until gout flare has been resolved ?- 04/07/22 pain still present today albeit slightly improved, still affecting mobility. ?- Give prednisone 10 mg x 2 ?-04/08/22-we will get dietitian consult for patient education please cannot eat ?-4/29 Gout pain controlled, prednisone and colchicine course completed.  -Continue to monitor. ?11.  Restless leg syndrome.  Continue Requip. ?-4/29 No issues, Requip  working well. ?12.  OSA.  Continue CPAP ?13.  CKD stage III. ?- 04/07/2022 further improvement of creatinine to 1.83 although BUN slightly elevated ? -04/12/22 Continue to monitor. Encourage appropriate fluid intake ? 5

## 2022-04-15 NOTE — Patient Care Conference (Signed)
Inpatient RehabilitationTeam Conference and Plan of Care Update ?Date: 04/15/2022   Time: 11:11 AM  ? ? ?Patient Name: [name]      ?Medical Record Number: [id number]  ?Date of Birth: 10-12-1946 ?Sex: Female         ?Room/Bed: 4W05C/4W05C-01 ?Payor Info: Payor: MEDICARE / Plan: MEDICARE PART A AND B / Product Type: *No Product type* /   ? ?Admit Date/Time:  04/04/2022 12:51 PM ? ?Primary Diagnosis:  Debility ? ?Hospital Problems: Principal Problem: ?  Debility ? ? ? ?Expected Discharge Date: Expected Discharge Date: 04/16/22 ? ?Team Members Present: ?Physician leading conference: Dr. Courtney Heys ?Social Worker Present: Loralee Pacas, LCSWA ?Nurse Present: Dorthula Nettles, RN ?PT Present: Excell Seltzer, PT ?OT Present: Roanna Epley, Claris Gladden, OT ?PPS Coordinator present : Gunnar Fusi, SLP ? ?   Current Status/Progress Goal Weekly Team Focus  ?Bowel/Bladder ? ? Patient continent of bowel and bladder.  Currently being treated for UTI.  Treated as needed for constipation.  Last BM 5/1.  Less frequent urination and decreased pain related to UTI.  Treat constipation as needed.  Assess every shift and prn.   ?Swallow/Nutrition/ Hydration ? ?           ?ADL's ? ? bathing-CGA/min A; dressing-min A; toileting-CGA/min A; tranfsers-CGA; assistance for wrapping BLE and donning Teds;  mod I overall  education. balance, functional tranfsers, BADLs   ?Mobility ? ? CGA 140f RW, CGA transfers RW  Supervision overall  balance, LE strengthening, gait with new AFOs   ?Communication ? ?           ?Safety/Cognition/ Behavioral Observations ?           ?Pain ? ? Intermittent generalized chronic pain which patient rates at 10/10 with peripheral neuropathy  Decreased pain for goal of 2 per patient stated comfort level.  Assess q shift and prn   ?Skin ? ? Ecchymosis in upper and lower extremities.  Ulcer to left leg covered with xeroform and foam daily.  No new signs of breakdown.  No sign of infection.  Assess skin q shift  and prn   ? ? ?Discharge Planning:  ?D/c to home with her husband who can only provide supervision. PRN support from children and grandchildren. Fam edu completed on Thursday (4/27) 1pm-4pm. [facility] SW provided pt husband sitter list so he can seek addtl support in the home.   ?Team Discussion: ?Cr balanced. Ordered 500cc bolus IVF's, stop after 500cc. Continent B/B, reports pain, and non-pressure wound to left foot, and burn mark to left leg. Treating appropriately. Discharging home, will need RW. Ambulating well with AFO's. Spouse has sAdvertising copywriter  ? ?Patient on target to meet rehab goals: ?yes, supervision to mod I goals. Currently supervision to min assist dressing. Will need assist with ted hose and AFO's.  ? ?*See Care Plan and progress notes for long and short-term goals.  ? ?Revisions to Treatment Plan:  ?Finalizing discharge plans and medications. ?  ?Teaching Needs: ?Family education completed. ?  ?Current Barriers to Discharge: ?No current barriers ? ?Possible Resolutions to Barriers: ?All barriers addressed. ?  ? ? Medical Summary ?Current Status: continent B/B- pain 7/10- needs nebs prn; 2 areas on foot/leg are OK; ? Barriers to Discharge: Decreased family/caregiver support;Home enviroment access/layout;Medical stability;Weight;Other (comments) ? Barriers to Discharge Comments: going home with H/H - will need RW- family ed done- balance still main issue ?Possible Resolutions to [employer]Focus: goals Supervision to mod I- will need  A for AFOs and ACE wraps/TEDs- supposed to hire something- d/c tomorrow ? ? ?Continued Need for Acute Rehabilitation Level of Care: The patient requires daily medical management by a physician with specialized training in physical medicine and rehabilitation for the following reasons: ?Direction of a multidisciplinary physical rehabilitation program to maximize functional independence : Yes ?Medical management of patient stability for increased activity  during participation in an intensive rehabilitation regime.: Yes ?Analysis of laboratory values and/or radiology reports with any subsequent need for medication adjustment and/or medical intervention. : Yes ? ? ?I attest that I was present, lead the team conference, and concur with the assessment and plan of the team. ? ? ?Dorthula Nettles G ?04/15/2022, 2:58 PM  ? ? ? ? ? ? ?

## 2022-04-15 NOTE — Progress Notes (Signed)
Patient ID: [name], female   DOB: [birth year], 77 y.o.   MRN: [id number] ? ?SW ordered DME: RW and nebulizer with [employer] via parachute. ? ?SW met with pt in room to provide updates from team conference, and d/c date remains 5/3. Pt will need RW and nebulizer and SW informed DME ordered with [facility]. Reports that she will have a friend that will help with a few meals once discharged. Pt aware SW will call her husband.  ? ?74- SW called pt husband to discuss above. SW discussed plans to hire help; reports that he likely will hire support. No questions/concerns reported.  ? ?Loralee Pacas, MSW, LCSWA ?Office: [phone] ?Cell: [phone] ?Fax: [phone]  ?

## 2022-04-15 NOTE — Progress Notes (Signed)
Physical Therapy Session Note ? ?Patient Details  ?Name: [name] ?MRN: [id number] ?Date of Birth: Feb 09, 1946 ? ?Today's Date: 04/15/2022 ?PT Individual Time: 0905-1005 ?PT Individual Time Calculation (min): 60 min  ? ?Short Term Goals: ?Week 2:  PT Short Term Goal 1 (Week 2): =LTGs d/t ELOS ? ? ?Skilled Therapeutic Interventions/Progress Updates:  ?Pt received sitting in w/c and agreeable to therapy. PT provided maxA to don ace bandage wraps (per MD request) for BP and edema management. Printed handout along with pt education provided regarding how to instruct pt's husband to wrap ace bandages at home and SPT reviewed HEP balance exercises (HEP listed below). Pt performed bed mobility, STS and stand-pivot transfers with supervision for safety; occasionally requires increased time or a second attempt to perform STS successfully. Gait training initiated x137f with RW supervision, verbal cuing provided to maintain upright trunk and relax shoulders. Pt reports RLE feeling heavier this session, making it more difficult to swing during gait; PT explained that this may be d/t to the addition of ace bandage wrap inside of her tennis shoes with external AFO's that will take some time to get used to, pt agreed. Car transfer performed with minA for LLE management and verbal cuing to help her lift the RLE by grabbing a hold of her AFO for easier maneuver; pt states that husband usually helps her with her LE's during car transfers. Ramp navigation initiated with RW CGA, no LOB noted. Navigation across uneven surface (mulch) with RW performed with minA and verbal cuing for safe management of RW; noted LOB to the L when pt was attempting to turn in her RW over mulch. SPT explained getting both feet aligned in a wider BOS before attempting to lift RW to turn when navigating surfaces like this; pt demonstrated understanding and awareness that she will have to attend to her balance more in these circumstances. Pt transported back to  room in w/c for time management. Pt left sitting in w/c with all needs in reach. ? ?HEP provided: ?Access Code: [email or web address] ?URL: [email or web address] ?Date: 04/15/2022 ?Prepared by: [name]? ?Exercises ?- Standing Hip Abduction with Counter Support  - 1 x daily - 7 x weekly - 3 sets - 10 reps ?- Standing Hip Extension with Counter Support  - 1 x daily - 7 x weekly - 3 sets - 10 reps ?- Standing March with Counter Support  - 1 x daily - 7 x weekly - 3 sets - 10 reps ?- Standing Tandem Balance with Counter Support  - 1 x daily - 7 x weekly - 3 sets - 10 reps ?- Side Stepping with Counter Support  - 1 x daily - 7 x weekly - 3 sets - 10 reps ?- Sit to Stand with Counter Support  - 1 x daily - 7 x weekly - 3 sets - 10 reps ?Therapy Documentation ?Precautions:  ?Precautions ?Precautions: Fall, Other (comment) ?Precaution Comments: Watch BP; has TEDs and abdominal binder, limited R shoulder ROM ?Other Brace: Thigh high TEDs and abdominal binder for BP control ?Restrictions ?Weight Bearing Restrictions: No ?RUE Weight Bearing: Weight bearing as tolerated ?Other Position/Activity Restrictions: R wrist fx and L 5th finger fx ? ? ? ?Therapy/Group: Individual Therapy ? ?[name]?04/15/2022, 12:22 PM  ?

## 2022-04-15 NOTE — Progress Notes (Incomplete)
Inpatient Rehabilitation Discharge Medication Review by a Pharmacist ? ?A complete drug regimen review was completed for this patient to identify any potential clinically significant medication issues. ? ?High Risk Drug Classes Is patient taking? Indication by Medication  ?Antipsychotic {Receiving?:26196}   ?Anticoagulant {Receiving?:26196} Apixaban: new PE  ?Antibiotic {Receiving?:26196} Cephalexin: UTI tx  ?Opioid {Receiving?:26196} Norco 7.5-'325mg'$ : PRN pain  ?Antiplatelet {Receiving?:26196}   ?Hypoglycemics/insulin {Yes or No?:26198}   ?Vasoactive Medication {Receiving?:26196} Nitrostat - PRN angina ?Torsemide: diuresis d/t HF ?Midodrine: orthostatic hypotension ?Propranolol: tremors  ?Chemotherapy No   ?Other {Yes or No?:26198} Albuterol: PRN SOB ?Baclofen: muscle spasms ?Duloxetine, Gabapentin: neuropathic pain ?Levetiracetam: seizure ppx ?Melatonin: sleep ?Miralax/ Senna: constipation ?Protonix - GERD ?Ropinirole: RLS  ? ? ? ?Type of Medication Issue Identified Description of Issue Recommendation(s)  ?Drug Interaction(s) (clinically significant) ?    ?Duplicate Therapy ?    ?Allergy ?    ?No Medication Administration End Date ?    ?Incorrect Dose ?    ?Additional Drug Therapy Needed ?    ?Significant med changes from prior encounter (inform family/care partners about these prior to discharge). Aspirin '81mg'$  discontinued   ?Other ?    ? ? ?Clinically significant medication issues were identified that warrant physician communication and completion of prescribed/recommended actions by midnight of the next day:  {Yes or No?:26198} ? ?Name of provider notified for urgent issues identified: ***  ? ?Provider Method of Notification: ***  ? ? ?Pharmacist comments: *** ?- Aspirin discontinued during previous admission to avoid bleeding risk w/ apixaban ?- Apixaban education completed ***  ? ? ?Time spent performing this drug regimen review (minutes): 20 ? ? ?Thank you for allowing pharmacy to be a part of this patient?s  care. ? ?Ardyth Harps, PharmD ?Clinical Pharmacist ? ?

## 2022-04-15 NOTE — Progress Notes (Signed)
Physical Therapy Discharge Summary ? ?Patient Details  ?Name: [name] ?MRN: [id number] ?Date of Birth: 1946/03/06 ? ?Today's Date: 04/15/2022 ? ?Patient has met 7 of 8 long term goals due to improved activity tolerance, improved balance, improved postural control, increased strength, decreased pain, ability to compensate for deficits, and improved awareness.  Patient to discharge at an ambulatory level Supervision.   Patient's care partner is independent to provide the necessary physical assistance at discharge. Husband came in for hands-on family education and has a good understanding of patient's current level of function. He will not be able to provide 24/7 assist and has discussed with rehab team potentially hiring a caregiver. ? ?Reasons goals not met: N/A ? ?Recommendation:  ?Patient will benefit from ongoing skilled PT services in home health setting to continue to advance safe functional mobility, address ongoing impairments in balance, endurance, UE and LE strength, and minimize fall risk. ? ?Equipment: ?Needs rolling walker ? ?Reasons for discharge: treatment goals met and discharge from hospital ? ?Patient/family agrees with progress made and goals achieved: Yes ? ?PT Discharge ?Precautions/Restrictions ?Precautions ?Precautions: Fall;Other (comment) ?Precaution Comments: Watch BP; has TEDs and abdominal binder, limited R shoulder ROM ?Required Braces or Orthoses: Other Brace ?Other Brace: Thigh high TEDs and abdominal binder for BP control ?Restrictions ?Weight Bearing Restrictions: No ?RUE Weight Bearing: Weight bearing as tolerated ?Other Position/Activity Restrictions: R wrist fx and L 5th finger fx ?Vital Signs ?Therapy Vitals ?Temp: 97.8 ?F (36.6 ?C) ?Temp Source: Oral ?Pulse Rate: 74 ?Resp: 18 ?BP: (!) 99/50 ?Patient Position (if appropriate): Sitting ?Oxygen Therapy ?SpO2: 100 % ?O2 Device: Room Air ?Pain ?Pain Assessment ?Pain Scale: 0-10 ?Pain Score: 9  ?Pain Location: Arm ?Pain Orientation:  Right;Left ?Pain Intervention(s): Medication (See eMAR) ?Pain Interference ?Pain Interference ?Pain Effect on Sleep: 4. Almost constantly ?Pain Interference with Therapy Activities: 3. Frequently ?Pain Interference with Day-to-Day Activities: 4. Almost constantly ?Vision/Perception  ?Vision - History ?Baseline Vision: No visual deficits ?Ability to See in Adequate Light: 1 Impaired ?Vision - Assessment ?Eye Alignment: Within Functional Limits ?Perception ?Perception: Within Functional Limits ?Praxis ?Praxis: Intact  ?Cognition ?Overall Cognitive Status: Within Functional Limits for tasks assessed ?Arousal/Alertness: Awake/alert ?Orientation Level: Oriented X4 ?Year: 2023 ?Attention: Focused;Sustained;Selective ?Focused Attention: Appears intact ?Sustained Attention: Appears intact ?Sustained Attention Impairment: Verbal complex;Functional complex ?Selective Attention: Appears intact ?Memory: Impaired ?Memory Impairment: Decreased recall of new information;Decreased short term memory ?Awareness: Appears intact ?Awareness Impairment: Emergent impairment;Anticipatory impairment ?Problem Solving: Appears intact ?Problem Solving Impairment: Verbal complex;Functional complex ?Safety/Judgment: Appears intact ?Comments: WFL for tasks assessed ?Sensation ?Sensation ?Light Touch: Impaired Detail ?Central sensation comments: Numbness and tingling BLE and BUE, hx of neuropathy ?Peripheral sensation comments: Hx of significant peripheral neuropathy, significnantly impaired in lateral lower leg and medial dorsum of feet bilaterally ?Hot/Cold:  (pt reports she is unable to feel hot/cold when washing hands) ?Proprioception: Impaired Detail ?Proprioception Impaired Details: Impaired RLE;Impaired LLE ?Coordination ?Gross Motor Movements are Fluid and Coordinated: No ?Fine Motor Movements are Fluid and Coordinated: No ?Coordination and Movement Description: generalized deconditioning ?Finger Nose Finger Test: baseline shoulder ROM  limitations on R ?Motor  ?Motor ?Motor: Other (comment) ?Motor - Skilled Clinical Observations: generalized deconditioning ?Motor - Discharge Observations: generalized deconditioning  ?Mobility ?Bed Mobility ?Bed Mobility: Sit to Supine;Supine to Sit ?Rolling Right: Independent ?Rolling Left: Independent ?Supine to Sit: Supervision/Verbal cueing ?Sit to Supine: Supervision/Verbal cueing ?Transfers ?Transfers: Sit to Stand;Stand to [employer] Transfers ?Sit to Stand: Supervision/Verbal cueing ?Stand to Sit: Supervision/Verbal cueing ?Stand Pivot Transfers: Supervision/Verbal  cueing ?Stand Pivot Transfer Details: Verbal cues for sequencing;Verbal cues for technique;Verbal cues for precautions/safety;Verbal cues for safe use of DME/AE ?Stand Pivot Transfer Details (indicate cue type and reason): occasional cues ?Transfer (Assistive device): Rolling walker ?Locomotion  ?Gait ?Ambulation: Yes ?Gait Assistance: Supervision/Verbal cueing ?Gait Distance (Feet): 150 Feet ?Assistive device: Rolling walker ?Gait Assistance Details: Verbal cues for precautions/safety;Verbal cues for safe use of DME/AE;Verbal cues for technique ?Gait Assistance Details: External AFOs for improved heel contact and to prevent inversion, RW ?Gait ?Gait: Yes ?Gait Pattern: Impaired ?Gait Pattern: Step-through pattern;Decreased hip/knee flexion - right;Decreased hip/knee flexion - left;Decreased dorsiflexion - right;Decreased dorsiflexion - left (slight forward trunk lean (frequently self-corrects)) ?Gait velocity: decreased ?Stairs / Additional Locomotion ?Stairs: No ?Wheelchair Mobility ?Wheelchair Mobility: No  ?Trunk/Postural Assessment  ?Cervical Assessment ?Cervical Assessment: Exceptions to [facility] (forward head with decreased rotation) ?Thoracic Assessment ?Thoracic Assessment: Exceptions to [facility] (increased kyphosis) ?Lumbar Assessment ?Lumbar Assessment: Exceptions to [facility] (posterior pelvic tilt) ?Postural Control ?Postural Control: Deficits  on evaluation ?Righting Reactions: delayed righting reactions often catching toes while ambulating and having minor posterior LOB during standing tasks - requires UE support on RW  ?Balance ?Balance ?Balance Assessed: Yes ?Static Sitting Balance ?Static Sitting - Balance Support: Feet supported ?Static Sitting - Level of Assistance: 6: Modified independent (Device/Increase time) ?Dynamic Sitting Balance ?Dynamic Sitting - Balance Support: Feet supported;No upper extremity supported;During functional activity ?Dynamic Sitting - Level of Assistance: 5: Stand by assistance ?Static Standing Balance ?Static Standing - Balance Support: During functional activity;Bilateral upper extremity supported ?Static Standing - Level of Assistance: 5: Stand by assistance ?Dynamic Standing Balance ?Dynamic Standing - Balance Support: During functional activity;Bilateral upper extremity supported ?Dynamic Standing - Level of Assistance: 5: Stand by assistance ?Extremity Assessment  ?RUE Assessment ?RUE Assessment: Exceptions to [facility] ?Active Range of Motion (AROM) Comments: 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 R wrist fx ?RUE Body System: Ortho ?LUE Assessment ?LUE Assessment: Within Functional Limits ?RLE Assessment ?RLE Assessment: Exceptions to [facility] ?General Strength Comments: impaired, see below ?RLE Strength ?Right Hip Flexion: 4/5 ?Right Knee Flexion: 4/5 ?Right Knee Extension: 5/5 ?Right Ankle Dorsiflexion: 2/5 ?LLE Assessment ?LLE Assessment: Exceptions to [facility] ?General Strength Comments: impaired, see below ?LLE Strength ?Left Hip Flexion: 4/5 ?Left Knee Flexion: 4/5 ?Left Knee Extension: 5/5 ?Left Ankle Dorsiflexion: 2/5 ? ? ? ?Lanetta Inch ?04/15/2022, 4:52 PM ?

## 2022-04-15 NOTE — Progress Notes (Signed)
Pt placed on CPAP for bed. RT will cont to monitor as needed.  

## 2022-04-16 MED ORDER — OXYCODONE HCL 5 MG PO TABS
5.0000 mg | ORAL_TABLET | Freq: Once | ORAL | Status: AC
  Administered 2022-04-16: 5 mg via ORAL
  Filled 2022-04-16: qty 1

## 2022-04-16 NOTE — Progress Notes (Signed)
Inpatient Rehabilitation Care Coordinator ?Discharge Note  ? ?Patient Details  ?Name: [name] ?MRN: [id number] ?Date of Birth: 12-Jan-1946 ? ? ?Discharge location: D/c to home ? ?Length of Stay: 11 days ? ?Discharge activity level: Supervision ? ?Home/community participation: Limited ? ?Patient response KF:EXMDYJ Literacy - How often do you need to have someone help you when you read instructions, pamphlets, or other written material from your doctor or pharmacy?: Never ? ?Patient response WL:KHVFMB Isolation - How often do you feel lonely or isolated from those around you?: Never ? ?Services provided included: MD, RD, PT, OT, SLP, Pharmacy, Neuropsych, SW, TR, CM, RN ? ?Financial Services:  ?Charity fundraiser Utilized: Medicare ?  ? ?Choices offered to/list presented to: Yes ? ?Follow-up services arranged:  ?DME, Home Health ?Home Health Agency: [facility] for HHPT/OT/aide  ?  ?DME : RW and nebulizer machine with treatement (albuterol) ?  ? ?Patient response to transportation need: ?Is the patient able to respond to transportation needs?: Yes ?In the past 12 months, has lack of transportation kept you from medical appointments or from getting medications?: No ?In the past 12 months, has lack of transportation kept you from meetings, work, or from getting things needed for daily living?: No ? ? ?Comments (or additional information): ? ?Patient/Family verbalized understanding of follow-up arrangements:  Yes ? ?Individual responsible for coordination of the follow-up plan: contact pt ? ?Confirmed correct DME delivered: Rana Snare 04/16/2022   ? ?Rana Snare ?

## 2022-04-16 NOTE — Progress Notes (Signed)
Inpatient Rehabilitation Discharge Medication Review by a Pharmacist ? ?A complete drug regimen review was completed for this patient to identify any potential clinically significant medication issues. ? ?High Risk Drug Classes Is patient taking? Indication by Medication  ?Antipsychotic No   ?Anticoagulant Yes Apixaban: new PE  ?Antibiotic Yes Cephalexin: UTI tx  ?Opioid Yes Norco 7.5-'325mg'$ : PRN pain  ?Antiplatelet No   ?Hypoglycemics/insulin No   ?Vasoactive Medication Yes Nitrostat - PRN angina ?Torsemide: diuresis d/t HF ?Midodrine: orthostatic hypotension ?Propranolol: tremors  ?Chemotherapy No   ?Other Yes Albuterol: PRN SOB ?Baclofen: muscle spasms ?Duloxetine, Gabapentin: neuropathic pain ?Levetiracetam: seizure ppx ?Meclizine: vertigo, nausea ?Melatonin: sleep ?Miralax/ Senna: constipation ?Nexium - GERD ?Ropinirole: RLS  ? ? ? ?Type of Medication Issue Identified Description of Issue Recommendation(s)  ?Drug Interaction(s) (clinically significant) ?    ?Duplicate Therapy ?    ?Allergy ?    ?No Medication Administration End Date ?    ?Incorrect Dose ?    ?Additional Drug Therapy Needed ?    ?Significant med changes from prior encounter (inform family/care partners about these prior to discharge). Aspirin '81mg'$  discontinued   ?Other ?    ? ? ?Clinically significant medication issues were identified that warrant physician communication and completion of prescribed/recommended actions by midnight of the next day:  No ? ? ?Pharmacist comments:  ?- Aspirin discontinued during previous admission to avoid bleeding risk w/ apixaban ?- Apixaban education completed 5/03 with patient  ? ? ?Time spent performing this drug regimen review (minutes): 20 ? ? ?Thank you for allowing pharmacy to be a part of this patient?s care. ? ?Ardyth Harps, PharmD ?Clinical Pharmacist ? ?

## 2022-04-16 NOTE — Progress Notes (Signed)
?                                                       PROGRESS NOTE ? ? ?Subjective/Complaints: ? ?Pt reports feeling better after IVFs- explained at home, to drink 300-500cc more on days she feels a lot more dry/dizzy- also strongly recommended seeing PCP in next 1-2 weeks and get labs for f/u on renal issues.  ? ?Pt asking for some pain meds before d/c- to help with ride home, etc.  ?Will give Oxy x1 in addition to other meds. Also will need to d/c IV.  ? ?ROS: ? ?Pt denies SOB, abd pain, CP, N/V/C/D, and vision changes ? ? ?Objective: ?  ?No results found. ?Recent Labs  ?  04/15/22 ?1062  ?WBC 5.3  ?HGB 9.2*  ?HCT 27.8*  ?PLT 262  ? ?Recent Labs  ?  04/15/22 ?6948  ?NA 135  ?K 3.9  ?CL 97*  ?CO2 27  ?GLUCOSE 86  ?BUN 60*  ?CREATININE 2.41*  ?CALCIUM 9.2  ? ? ?Intake/Output Summary (Last 24 hours) at 04/16/2022 0833 ?Last data filed at 04/16/2022 5462 ?Gross per 24 hour  ?Intake 478 ml  ?Output 500 ml  ?Net -22 ml  ?  ? ?  ? ?Physical Exam: ? ? ? ? ?General: awake, alert, appropriate, sitting EOB- finished 100% tray; NAD ?HENT: conjugate gaze; oropharynx moist ?CV: regular rate; no JVD ?Pulmonary: CTA B/L; no W/R/R- good air movement- no fluid overload heard ?GI: soft, NT, ND, (+)BS ?Psychiatric: appropriate- a little anxious about d/c.  ?Neurological: Ox3 ? ?Extremities; (+) tenting- but less ?MSK: Mild ankle and hand swelling effusions noted skin appears to be having tophi development beginning on third digit of left hand left lateral knee wound clean dry and intact with a dry dressing. ?Extremities: Trace LE edema.  Has thigh-high TED hose and Ace wraps on both legs. ?Psychiatric: Normal mood and affect. ?Skin- skin tear that's bleeding R lateral elbow ? ? ?Vital Signs ?Blood pressure (!) 104/54, pulse 82, temperature 98.1 ?F (36.7 ?C), resp. rate 14, height '5\' 7"'$  (1.702 m), weight 80.7 kg, SpO2 98 %. ? ? ? ?Assessment/Plan: ?1. Functional deficits which require 3+ hours per day of interdisciplinary therapy in a  comprehensive inpatient rehab setting. ?Physiatrist is providing close team supervision and 24 hour management of active medical problems listed below. ?Physiatrist and rehab team continue to assess barriers to discharge/monitor patient progress toward functional and medical goals ? ?Care Tool: ? ?Bathing ?   ?Body parts bathed by patient: Right arm, Left arm, Chest, Face, Abdomen, Front perineal area, Right upper leg, Left upper leg, Buttocks, Right lower leg, Left lower leg  ? Body parts bathed by helper: Right lower leg, Left lower leg ?  ?  ?Bathing assist Assist Level: Supervision/Verbal cueing ?  ?  ?Upper Body Dressing/Undressing ?Upper body dressing   ?What is the patient wearing?: [place] only ?   ?Upper body assist Assist Level: Supervision/Verbal cueing ?   ?Lower Body Dressing/Undressing ?Lower body dressing ? ? ?   ?What is the patient wearing?: Underwear/pull up, Pants ? ?  ? ?Lower body assist Assist for lower body dressing: Supervision/Verbal cueing ?   ? ?Toileting ?Toileting    ?Toileting assist Assist for toileting: Supervision/Verbal cueing ?  ?  ?Transfers ?Chair/bed transfer ? ?  Transfers assist ?   ? ?Chair/bed transfer assist level: Supervision/Verbal cueing ?Chair/bed transfer assistive device: Walker, Armrests ?  ?Locomotion ?Ambulation ? ? ?Ambulation assist ? ?   ? ?Assist level: Supervision/Verbal cueing ?Assistive device: Walker-rolling ?Max distance: 157f  ? ?Walk 10 feet activity ? ? ?Assist ?   ? ?Assist level: Supervision/Verbal cueing ?Assistive device: Walker-rolling  ? ?Walk 50 feet activity ? ? ?Assist Walk 50 feet with 2 turns activity did not occur: Safety/medical concerns ? ?Assist level: Supervision/Verbal cueing ?Assistive device: Walker-rolling  ? ? ?Walk 150 feet activity ? ? ?Assist   ? ?Assist level: Supervision/Verbal cueing ?Assistive device: Walker-rolling ?  ? ?Walk 10 feet on uneven surface  ?activity ? ? ?Assist   ? ? ?Assist level: Minimal Assistance -  Patient > 75% ?Assistive device: Walker-rolling  ? ?Wheelchair ? ? ? ? ?Assist Is the patient using a wheelchair?: No ?  ?  ? ?  ?   ? ? ?Wheelchair 50 feet with 2 turns activity ? ? ? ?Assist ? ?  ?  ? ? ?   ? ?Wheelchair 150 feet activity  ? ? ? ?Assist ?   ? ? ?   ? ?Blood pressure (!) 104/54, pulse 82, temperature 98.1 ?F (36.7 ?C), resp. rate 14, height '5\' 7"'$  (1.702 m), weight 80.7 kg, SpO2 98 %. ? ?Medical Problem List and Plan: ?1. Functional deficits secondary to debility related to CHF exacerbation/pulmonary emboli/gout/history of CVA. ?            - Patient may shower ?            -ELOS/Goals: 5 to 7 days. ?           Continue CIR- PT, OT and SLP ? Team conference today to finalize d/c ? D/c tomorrow- will give 500cc IVFs so don't fluid overload her- since Cr up to 2.4 ? D/c today- will need f/u with me- per pt request.  ?2.  Antithrombotics: ?-DVT/anticoagulation: Pharmaceutical: Eliquis ?            -antiplatelet therapy: Not applicable ?3. Pain Management/chronic pain syndrome: Baclofen 5 mg 3 times daily, Neurontin 100 mg 3 times daily, hydrocodone as needed ?    -4/22-pain controlled continue regimen ?     -4/23-Pain getting worse again will reduce baclofen to 5 mg twice daily due to renal function. ?      -4/25-change Norco to Oxy, but did not work we will change back to Norco 50 mg every 6 hours every 6 hours as needed ?      -4/28 patient did not get meds for pain for 36 hours due to low blood pressure-we will change parameters to hold Norco if BP low since this is chronic ?    5/1- pain doing ok/stable- con't Norco ? 5/3- will give Oxy 5 mg x1 before d/c in addition to Norco prn- can get 5-7 days of Norco but will need to get form PCP /pain doctor in future- also discussed most pain doctors don't allow CBD gummies AND pain meds.  ?4. Mood: Cymbalta 60 mg twice daily, melatonin 5 mg nightly ?      - 4/23-will reduce duloxetine to 60 mg daily due to renal function ?      - Antipsychotic agents: not  applicable ?5. Neuropsych: This patient is capable of making decisions on her own behalf. ?6. Skin/Wound Care: Routine skin checks ?7. Fluids/Electrolytes/Nutrition: Routine in and outs with follow-up chemistries ? 5/2- will  give 500cc NS IVFs since Cr up to 2.4- before leaves tomorrow ? 5/3- feeling better- con't to do 300-500cc if feels real dry- can take PO at home ?8.  Acute on chronic on chronic diastolic congestive heart failure.  Follow-up with heart failure team.  Monitor for for any signs of fluid overload.  Continue Demadex 20 mg daily. ?  -04/08/2022 weight up almost 3 kg in 1 day-we will check and correct and follow trend-we will change fluid restriction to 1800 cc due to positive tenting on exam ?  -04/10/2022 we will change fluid restriction back to 1500 cc-give Lasix 40 mg twice daily x2 since has fluid overload-educated needs to stick with fluid restriction educated on CHF versus low BP.  If SOB does not improve by noon will get chest x-ray PA and lateral. ?-04/11/2022-checks x-ray okay weight down dramatically with Lasix ? -4/29: No signs of fluid overload, no edema maintain Demadex 20 mg daily.  Current weight is 76 kg's stable. ? 5/1- weight up from 76 to 81 kg in 1 day- unlikely- will recheck in AM and monitor trend ? 5/2- weight back down some- will monitor at home daily after d/c.  ?Filed Weights  ? 04/13/22 0500 04/14/22 0422 04/15/22 0500  ?Weight: 76.3 kg 81.4 kg 80.7 kg  ?  ?9.  Hypotension.  Protamine 10 mg 3 times daily.  Monitor with increased mobility ?  -04/10/2022 doing Ace wrap's, abdominal binder and thigh-high teds ?  -04/11/2022 this is chronic will not hold pain meds ?  -04/12/22 continuing to maintain blood pressure with current pain regimen, lowest BP in the last 24 hours is 98/57. ?04/13/22 B/P stable over past 24 hrs 105/58-111/56 ? 5/3- BP 104/50s- stable dizziness ?10.  Gout.  Uric acid 11.1.  Prednisone taper ?- 04/06/2022-we will give colchicine 1.2 mg and then 0.6 mg later and  then cannot repeat for 2 weeks since no allopurinol allopurinol out until gout flare has been resolved ?- 04/07/22 pain still present today albeit slightly improved, still affecting mobility. ?- Give predn

## 2022-04-17 ENCOUNTER — Ambulatory Visit: Payer: Medicare Other | Admitting: Adult Health

## 2022-04-17 ENCOUNTER — Encounter: Payer: Self-pay | Admitting: Adult Health

## 2022-04-17 DIAGNOSIS — M4800 Spinal stenosis, site unspecified: Secondary | ICD-10-CM | POA: Diagnosis not present

## 2022-04-17 DIAGNOSIS — E559 Vitamin D deficiency, unspecified: Secondary | ICD-10-CM | POA: Diagnosis not present

## 2022-04-17 DIAGNOSIS — E1142 Type 2 diabetes mellitus with diabetic polyneuropathy: Secondary | ICD-10-CM | POA: Diagnosis not present

## 2022-04-17 DIAGNOSIS — M1990 Unspecified osteoarthritis, unspecified site: Secondary | ICD-10-CM | POA: Diagnosis not present

## 2022-04-17 DIAGNOSIS — E785 Hyperlipidemia, unspecified: Secondary | ICD-10-CM | POA: Diagnosis not present

## 2022-04-17 DIAGNOSIS — R251 Tremor, unspecified: Secondary | ICD-10-CM | POA: Diagnosis not present

## 2022-04-17 DIAGNOSIS — Z9889 Other specified postprocedural states: Secondary | ICD-10-CM | POA: Diagnosis not present

## 2022-04-17 DIAGNOSIS — J45909 Unspecified asthma, uncomplicated: Secondary | ICD-10-CM | POA: Diagnosis not present

## 2022-04-17 DIAGNOSIS — M10342 Gout due to renal impairment, left hand: Secondary | ICD-10-CM | POA: Diagnosis not present

## 2022-04-17 DIAGNOSIS — I2699 Other pulmonary embolism without acute cor pulmonale: Secondary | ICD-10-CM | POA: Diagnosis not present

## 2022-04-17 DIAGNOSIS — N390 Urinary tract infection, site not specified: Secondary | ICD-10-CM | POA: Diagnosis not present

## 2022-04-17 DIAGNOSIS — K5790 Diverticulosis of intestine, part unspecified, without perforation or abscess without bleeding: Secondary | ICD-10-CM | POA: Diagnosis not present

## 2022-04-17 DIAGNOSIS — I5033 Acute on chronic diastolic (congestive) heart failure: Secondary | ICD-10-CM | POA: Diagnosis not present

## 2022-04-17 DIAGNOSIS — T24031D Burn of unspecified degree of right lower leg, subsequent encounter: Secondary | ICD-10-CM | POA: Diagnosis not present

## 2022-04-17 DIAGNOSIS — I82462 Acute embolism and thrombosis of left calf muscular vein: Secondary | ICD-10-CM | POA: Diagnosis not present

## 2022-04-17 DIAGNOSIS — S61511D Laceration without foreign body of right wrist, subsequent encounter: Secondary | ICD-10-CM | POA: Diagnosis not present

## 2022-04-17 DIAGNOSIS — S62617D Displaced fracture of proximal phalanx of left little finger, subsequent encounter for fracture with routine healing: Secondary | ICD-10-CM | POA: Diagnosis not present

## 2022-04-17 DIAGNOSIS — M80031D Age-related osteoporosis with current pathological fracture, right forearm, subsequent encounter for fracture with routine healing: Secondary | ICD-10-CM | POA: Diagnosis not present

## 2022-04-17 DIAGNOSIS — G894 Chronic pain syndrome: Secondary | ICD-10-CM | POA: Diagnosis not present

## 2022-04-17 DIAGNOSIS — K219 Gastro-esophageal reflux disease without esophagitis: Secondary | ICD-10-CM | POA: Diagnosis not present

## 2022-04-17 DIAGNOSIS — T24032D Burn of unspecified degree of left lower leg, subsequent encounter: Secondary | ICD-10-CM | POA: Diagnosis not present

## 2022-04-17 DIAGNOSIS — M10362 Gout due to renal impairment, left knee: Secondary | ICD-10-CM | POA: Diagnosis not present

## 2022-04-17 DIAGNOSIS — W19XXXD Unspecified fall, subsequent encounter: Secondary | ICD-10-CM | POA: Diagnosis not present

## 2022-04-17 DIAGNOSIS — N183 Chronic kidney disease, stage 3 unspecified: Secondary | ICD-10-CM | POA: Diagnosis not present

## 2022-04-17 DIAGNOSIS — G2581 Restless legs syndrome: Secondary | ICD-10-CM | POA: Diagnosis not present

## 2022-04-17 DIAGNOSIS — M519 Unspecified thoracic, thoracolumbar and lumbosacral intervertebral disc disorder: Secondary | ICD-10-CM | POA: Diagnosis not present

## 2022-04-17 DIAGNOSIS — M25531 Pain in right wrist: Secondary | ICD-10-CM | POA: Diagnosis not present

## 2022-04-17 DIAGNOSIS — D509 Iron deficiency anemia, unspecified: Secondary | ICD-10-CM | POA: Diagnosis not present

## 2022-04-18 ENCOUNTER — Encounter (HOSPITAL_COMMUNITY): Payer: Self-pay

## 2022-04-18 ENCOUNTER — Telehealth: Payer: Self-pay

## 2022-04-18 ENCOUNTER — Ambulatory Visit (HOSPITAL_BASED_OUTPATIENT_CLINIC_OR_DEPARTMENT_OTHER)
Admit: 2022-04-18 | Discharge: 2022-04-18 | Disposition: A | Discharge status: Home / Self Care | Payer: Medicare Other | Attending: Family Medicine | Admitting: Family Medicine

## 2022-04-18 VITALS — BP 84/50 | HR 90 | Wt 188.8 lb

## 2022-04-18 DIAGNOSIS — R0789 Other chest pain: Secondary | ICD-10-CM | POA: Diagnosis not present

## 2022-04-18 DIAGNOSIS — Z9989 Dependence on other enabling machines and devices: Secondary | ICD-10-CM | POA: Insufficient documentation

## 2022-04-18 DIAGNOSIS — R790 Abnormal level of blood mineral: Secondary | ICD-10-CM | POA: Diagnosis not present

## 2022-04-18 DIAGNOSIS — R079 Chest pain, unspecified: Secondary | ICD-10-CM | POA: Diagnosis not present

## 2022-04-18 DIAGNOSIS — I639 Cerebral infarction, unspecified: Secondary | ICD-10-CM

## 2022-04-18 DIAGNOSIS — R2243 Localized swelling, mass and lump, lower limb, bilateral: Secondary | ICD-10-CM | POA: Diagnosis not present

## 2022-04-18 DIAGNOSIS — Z20822 Contact with and (suspected) exposure to covid-19: Secondary | ICD-10-CM | POA: Diagnosis not present

## 2022-04-18 DIAGNOSIS — N183 Chronic kidney disease, stage 3 unspecified: Secondary | ICD-10-CM

## 2022-04-18 DIAGNOSIS — I5032 Chronic diastolic (congestive) heart failure: Secondary | ICD-10-CM | POA: Insufficient documentation

## 2022-04-18 DIAGNOSIS — M2550 Pain in unspecified joint: Secondary | ICD-10-CM

## 2022-04-18 DIAGNOSIS — R0602 Shortness of breath: Secondary | ICD-10-CM | POA: Diagnosis not present

## 2022-04-18 DIAGNOSIS — N179 Acute kidney failure, unspecified: Secondary | ICD-10-CM | POA: Diagnosis not present

## 2022-04-18 DIAGNOSIS — I951 Orthostatic hypotension: Secondary | ICD-10-CM

## 2022-04-18 DIAGNOSIS — R531 Weakness: Secondary | ICD-10-CM | POA: Diagnosis not present

## 2022-04-18 DIAGNOSIS — Z8673 Personal history of transient ischemic attack (TIA), and cerebral infarction without residual deficits: Secondary | ICD-10-CM | POA: Insufficient documentation

## 2022-04-18 DIAGNOSIS — Z86718 Personal history of other venous thrombosis and embolism: Secondary | ICD-10-CM | POA: Diagnosis not present

## 2022-04-18 DIAGNOSIS — R197 Diarrhea, unspecified: Secondary | ICD-10-CM | POA: Diagnosis not present

## 2022-04-18 DIAGNOSIS — R1084 Generalized abdominal pain: Secondary | ICD-10-CM | POA: Diagnosis not present

## 2022-04-18 DIAGNOSIS — I359 Nonrheumatic aortic valve disorder, unspecified: Secondary | ICD-10-CM | POA: Diagnosis not present

## 2022-04-18 DIAGNOSIS — R110 Nausea: Secondary | ICD-10-CM | POA: Diagnosis not present

## 2022-04-18 DIAGNOSIS — I350 Nonrheumatic aortic (valve) stenosis: Secondary | ICD-10-CM | POA: Insufficient documentation

## 2022-04-18 DIAGNOSIS — I509 Heart failure, unspecified: Secondary | ICD-10-CM | POA: Diagnosis not present

## 2022-04-18 DIAGNOSIS — Z86711 Personal history of pulmonary embolism: Secondary | ICD-10-CM | POA: Insufficient documentation

## 2022-04-18 DIAGNOSIS — G629 Polyneuropathy, unspecified: Secondary | ICD-10-CM | POA: Insufficient documentation

## 2022-04-18 DIAGNOSIS — Z79899 Other long term (current) drug therapy: Secondary | ICD-10-CM | POA: Insufficient documentation

## 2022-04-18 DIAGNOSIS — D472 Monoclonal gammopathy: Secondary | ICD-10-CM | POA: Diagnosis not present

## 2022-04-18 DIAGNOSIS — Z9884 Bariatric surgery status: Secondary | ICD-10-CM | POA: Insufficient documentation

## 2022-04-18 DIAGNOSIS — G4733 Obstructive sleep apnea (adult) (pediatric): Secondary | ICD-10-CM | POA: Insufficient documentation

## 2022-04-18 DIAGNOSIS — M109 Gout, unspecified: Secondary | ICD-10-CM | POA: Insufficient documentation

## 2022-04-18 DIAGNOSIS — G2581 Restless legs syndrome: Secondary | ICD-10-CM | POA: Insufficient documentation

## 2022-04-18 DIAGNOSIS — I872 Venous insufficiency (chronic) (peripheral): Secondary | ICD-10-CM | POA: Diagnosis not present

## 2022-04-18 DIAGNOSIS — I130 Hypertensive heart and chronic kidney disease with heart failure and stage 1 through stage 4 chronic kidney disease, or unspecified chronic kidney disease: Secondary | ICD-10-CM | POA: Insufficient documentation

## 2022-04-18 DIAGNOSIS — R5383 Other fatigue: Secondary | ICD-10-CM | POA: Diagnosis not present

## 2022-04-18 DIAGNOSIS — Z7901 Long term (current) use of anticoagulants: Secondary | ICD-10-CM | POA: Diagnosis not present

## 2022-04-18 LAB — BASIC METABOLIC PANEL
Anion gap: 12 (ref 5–15)
BUN: 54 mg/dL — ABNORMAL HIGH (ref 8–23)
CO2: 26 mmol/L (ref 22–32)
Calcium: 9.4 mg/dL (ref 8.9–10.3)
Chloride: 101 mmol/L (ref 98–111)
Creatinine, Ser: 2.26 mg/dL — ABNORMAL HIGH (ref 0.44–1.00)
GFR, Estimated: 22 mL/min — ABNORMAL LOW (ref >60)
Glucose, Bld: 94 mg/dL (ref 70–99)
Potassium: 4.1 mmol/L (ref 3.5–5.1)
Sodium: 139 mmol/L (ref 135–145)

## 2022-04-18 LAB — CBC
HCT: 29.1 % — ABNORMAL LOW (ref 36.0–46.0)
Hemoglobin: 9.6 g/dL — ABNORMAL LOW (ref 12.0–15.0)
MCH: 32.5 pg (ref 26.0–34.0)
MCHC: 33 g/dL (ref 30.0–36.0)
MCV: 98.6 fL (ref 80.0–100.0)
Platelets: 277 10*3/uL (ref 150–400)
RBC: 2.95 MIL/uL — ABNORMAL LOW (ref 3.87–5.11)
RDW: 13.6 % (ref 11.5–15.5)
WBC: 6.9 10*3/uL (ref 4.0–10.5)
nRBC: 0 % (ref 0.0–0.2)

## 2022-04-18 LAB — BRAIN NATRIURETIC PEPTIDE: B Natriuretic Peptide: 830.9 pg/mL — ABNORMAL HIGH (ref 0.0–100.0)

## 2022-04-18 NOTE — Telephone Encounter (Signed)
[name] with [facility] called seeking verbal orders; ? ?Home Health  once a week for one week, twice a week for 6 weeks, once a week for 3 weeks. And home nursing aide twice a week for 4 weeks. Also a skilled nursing evaluation.  ? ?Discharge note reviewed. Verbal okay given. ?

## 2022-04-18 NOTE — Progress Notes (Signed)
? ?ADVANCED HF CLINIC NOTE ? ? ?Primary Care: Janice Morning, DO ?HF Cardiologist: Dr. Aundra Dubin ? ?HPI: ?[name] is a 76 y.o.female with history of cryptogenic stroke s/p LINQ with no evidence of AF, hx anomalous origin of the RCA, OSA, pulmonary hypertension, hx gastric bypass surgery, peripheral neuropathy. ?  ?Admitted 2/23 from EP office with complaints of dyspnea. CTA chest no PE. Echo showed EF 55-60%, RV okay, RVSP 41.9 mmHg, mild to moderate MR, moderate AI, mild AS with mean gradient 15 mmHg, dilated IVC with estimated RA pressure 8 mmHg. Had negative stress test. CTA chest no evidence of PE, 4th and 6th left rib fractures, b/l pleural effusions. RHC showed RA mean 16, PCWP mean 23 mmHg, PAP mean 38 mmHg, Fick CO/CI 7.5/3.71, PAPi 1.75, PVR 1.99. She was diuresed with IV lasix. dCHF, orthostasis and peripheral neuropathy suspicious for amyloid, however cMRI was equivocal for amyloidosis and PYP was not suggestive. GDMT limited by orthostasis and elevated SCr. Discharged to CIR, weight 180 lbs. ? ?Re-admitted 4/23 with segmental and subsegmental PE RUL and LLLL, and recurrent a/c CHF. She was diuresed with IV lasix and started on heparin gtt. LE venous dopplers + for left DVT. No RV strain on echo. Plan for long-term Eliquis. She was discharged to CIR for rehab, weight 177 lbs. ? ?Today she returns for post hospital HF follow up with her husband. She has been home for 2 days. Overall feeling fine. Having occasional dizziness and LE swelling. Just started taking torsemide 2 days ago. Will do PT 3x/week. Mild SOB with ADLs.  Denies palpitations, CP, abnormal bleeding, or PND/Orthopnea. Appetite ok. No fever or chills. She is not weighing at home. Taking all medications.  ? ?ECG (personally reviewed): SR w/ 1st degree AVB, PR 242 msec ? ?Labs (3/23): K 3.5, creatinine 2.23, hgb 11.4 ?Labs (5/23): K 3.9, creatinine 2.41 ? ? ?Past Medical History:  ?Diagnosis Date  ? Anemia   ? unable to absorb iron after  gastric bypass  ? Arthritis   ? generalized  ? Asthma   ? Atrophic vaginitis   ? Back pain   ? DDD/stenosis  ? Carotid stenosis   ? Carotid US 10/16: Plaque RICA (1-95%), normal LICA  ? Depression   ? takes Cymbalta daily  ? Diverticulosis   ? benign  ? DJD (degenerative joint disease)   ? Dyslipidemia   ? Dysrhythmia   ? Family history of GI bleeding   ? GERD (gastroesophageal reflux disease)   ? takes Nexium daily  ? Gestational diabetes   ? Pt denies  ? H/O hiatal hernia   ? surgery for hernia  ? Headache(784.0)   ? takes Imitrex daily as needed and Bisoprolol daily;last migraine was about 2wks ago  ? History of bronchitis 1 yr ago  ? History of shingles   ? Insomnia   ? takes Ambien nightly  ? Joint pain   ? Joint swelling   ? Leg cramps   ? takes Flexeril daily as needed  ? 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 Nocturia   ? Osteoporosis   ? Peripheral neuropathy   ? takes Gabapentin daily  ? Pneumonia 9yr ago  ? hx of  ? PONV (postoperative nausea and vomiting)   ? Restless leg syndrome   ? takes Requip daily  ? RLS (restless legs syndrome) 08/16/2015  ? Sleep apnea   ? uses CPAP  ? Stroke ([facility]   ? Tubular adenoma of colon   ? Vertigo   ?  Vitamin D deficiency   ? ? ?Current Outpatient Medications  ?Medication Sig Dispense Refill  ? acetaminophen (TYLENOL) 325 MG tablet Take 2 tablets (650 mg total) by mouth every 4 (four) hours as needed for headache or mild pain.    ? apixaban (ELIQUIS) 5 MG TABS tablet Take 1 tablet (5 mg total) by mouth 2 (two) times daily. 60 tablet 0  ? Baclofen 5 MG TABS Take 5 mg by mouth 2 (two) times daily. 60 tablet 0  ? calcium carbonate (OS-CAL) 1250 (500 Ca) MG chewable tablet Chew 1 tablet (1,250 mg total) by mouth daily. Not sure of dosage 30 tablet 0  ? cephALEXin (KEFLEX) 500 MG capsule Take 1 capsule (500 mg total) by mouth every 12 (twelve) hours. 8 capsule 0  ? DULoxetine (CYMBALTA) 60 MG capsule Take 1 capsule (60 mg total) by mouth daily. 30 capsule 3  ? esomeprazole  (NEXIUM) 40 MG capsule Take 1 capsule (40 mg total) by mouth 2 (two) times daily before a meal. 60 capsule 6  ? gabapentin (NEURONTIN) 100 MG capsule Take 1 capsule (100 mg total) by mouth 3 (three) times daily. 90 capsule 0  ? HYDROcodone-acetaminophen (NORCO) 7.5-325 MG tablet Take 2 tablets by mouth every 6 (six) hours as needed for severe pain. 30 tablet 0  ? levETIRAcetam (KEPPRA) 250 MG tablet Take 1 tablet (250 mg total) by mouth 2 (two) times daily. 60 tablet 0  ? loratadine (CLARITIN) 10 MG tablet Take 1 tablet (10 mg total) by mouth daily. 30 tablet 0  ? meclizine (ANTIVERT) 12.5 MG tablet Take 1 tablet (12.5 mg total) by mouth 3 (three) times daily. 90 tablet 0  ? melatonin 5 MG TABS Take 1 tablet (5 mg total) by mouth at bedtime. 30 tablet 0  ? midodrine (PROAMATINE) 10 MG tablet Take 1 tablet (10 mg total) by mouth 3 (three) times daily. 90 tablet 2  ? nitroGLYCERIN (NITROSTAT) 0.4 MG SL tablet Place 1 tablet (0.4 mg total) under the tongue every 5 (five) minutes as needed for chest pain. 10 tablet 0  ? polyethylene glycol (MIRALAX / GLYCOLAX) 17 g packet Take 17 g by mouth 2 (two) times daily. 14 each 0  ? PROAIR HFA 108 (90 Base) MCG/ACT inhaler Inhale 1 puff into the lungs every 6 (six) hours as needed for shortness of breath. 1 each 5  ? rOPINIRole (REQUIP) 1 MG tablet Take 1 mg by mouth daily in the afternoon. 2 tablets Qhs    ? senna-docusate (SENOKOT-S) 8.6-50 MG tablet Take 1 tablet by mouth at bedtime as needed for mild constipation.    ? torsemide (DEMADEX) 20 MG tablet Take 1 tablet (20 mg total) by mouth daily. 30 tablet 2  ? Vitamin D, Ergocalciferol, (DRISDOL) 1.25 MG (50000 UNIT) CAPS capsule Take 1 capsule (50,000 Units total) by mouth every 7 (seven) days. 13 capsule 1  ? ?No current facility-administered medications for this encounter.  ? ?Allergies  ?Allergen Reactions  ? Atorvastatin   ?  Other reaction(s): myalgia  ? Crestor [Rosuvastatin]   ?  Other reaction(s): myalgia  ?  Ezetimibe   ?  Other reaction(s): myalgia  ? Amitriptyline Rash  ? ?Social History  ? ?Socioeconomic History  ? Marital status: Married  ?  Spouse name: Pilar Jarvis  ? Number of children: 3  ? Years of education: college  ? Highest education level: Not on file  ?Occupational History  ? Occupation: Retired  ?Tobacco Use  ? Smoking status:  Never  ? Smokeless tobacco: Never  ?Vaping Use  ? Vaping Use: Never used  ?Substance and Sexual Activity  ? Alcohol use: Yes  ?  Alcohol/week: 0.0 standard drinks  ?  Comment: Occasional  ? Drug use: No  ? Sexual activity: Yes  ?  Birth control/protection: Post-menopausal  ?Other Topics Concern  ? Not on file  ?Social History Narrative  ? Lives at home w/ her husband  ? Caffeine 8-10 cups daily.  (coffee 1 cup am, unsw tea all day long).   ? ?Social Determinants of Health  ? ?Financial Resource Strain: Not on file  ?Food Insecurity: Not on file  ?Transportation Needs: Not on file  ?Physical Activity: Not on file  ?Stress: Not on file  ?Social Connections: Not on file  ?Intimate Partner Violence: Not on file  ? ?Family History  ?Problem Relation Age of Onset  ? CAD Mother   ? Hypertension Mother   ? Neuropathy Mother   ? Osteoarthritis Mother   ? Heart disease Mother   ? Breast cancer Maternal Grandmother   ? CAD Paternal Grandfather   ? Colon cancer Father   ? ?BP (!) 84/50   Pulse 90   Wt 85.6 kg (188 lb 12.8 oz)   BMI 29.57 kg/m?  ? ?Wt Readings from Last 3 Encounters:  ?04/14/2022 85.6 kg (188 lb 12.8 oz)  ?04/15/22 80.7 kg (177 lb 14.6 oz)  ?04/04/22 80 kg (176 lb 5.9 oz)  ? ?PHYSICAL EXAM: ?General:  NAD. No resp difficulty, arrived in [facility] ?HEENT: Normal ?Neck: Supple. No JVD. Carotids 2+ bilat; no bruits. No lymphadenopathy or thryomegaly appreciated. ?Cor: PMI nondisplaced. Regular rate & rhythm. No rubs, gallops or murmurs. ?Lungs: Clear ?Abdomen: Soft, nontender, nondistended. No hepatosplenomegaly. No bruits or masses. Good bowel sounds, abdominal binder on ?Extremities:  No cyanosis, clubbing, rash, trace BLE edema, compression hose and bilateral leg braces on ?Neuro: Alert & oriented x 3, cranial nerves grossly intact. Moves all 4 extremities w/o difficulty. Affect pleasant. ? ?ASSESSMENT &

## 2022-04-18 NOTE — Telephone Encounter (Signed)
Transitional Care Call--who you spoke with Mrs. [name] (pt returned call) ? ? ?Are you/is patient experiencing any problems since coming home? No problem. Are there any questions regarding any aspect of care? No.  ?Are there any questions regarding medications administration/dosing? Home Health will be putting her medications in pill packs.  Are meds being taken as prescribed? Yes.  She has been also reminded to take Cymbalta 60 MG daily (per discharge summary).  Patient should review meds with caller to confirm . Done ?Have there been any falls? No. ?Has Home Health been to the house and/or have they contacted you? Yes. If not, have you tried to contact them? No need. Can we help you contact them? N/A.Marland Kitchen ?Are bowels and bladder emptying properly? Yes. Are there any unexpected incontinence issues? No. If applicable, is patient following bowel/bladder programs? N/A. ?Any fevers, problems with breathing, unexpected pain?No ?Are there any skin problems or new areas of breakdown?None. ?Has the patient/family member arranged specialty MD follow up (ie cardiology/neurology/renal/surgical/etc)?  Yes. Can we help arrange? No need.  ?Does the patient need any other services or support that we can help arrange? No. ?Are caregivers following through as expected in assisting the patient? Yes. ?       11. Has the patient quit smoking, drinking alcohol, or using drugs as recommended? Patient does not indulge.  ? ?Appointment 04/26/2022 with PM&R at 2:00 PM arrival with [facility] [address] ?

## 2022-04-18 NOTE — Patient Instructions (Addendum)
Thank you for coming in today ? ?Labs were done today, if any labs are abnormal the clinic will call you ?No news is good news ? ?STOP Propanolol ? ?You have been referred to [facility] and Hematology oncology clinics they will contact you for further appointment details ? ?Your physician recommends that you schedule a follow-up appointment in:  ?3 weeks in clinic and 3 months with Dr. Aundra Dubin   ? ?At the [facility], you and your health needs are our priority. As part of our continuing mission to provide you with exceptional heart care, we have created designated Provider Care Teams. These Care Teams include your primary Cardiologist (physician) and Advanced Practice Providers (APPs- Physician Assistants and Nurse Practitioners) who all work together to provide you with the care you need, when you need it.  ? ?You may see any of the following providers on your designated Care Team at your next follow up: ?Dr Glori Bickers ?Dr Loralie Champagne ?Darrick Grinder, NP ?Lyda Jester, PA ?Jessica Milford,NP ?Marlyce Huge, PA ?Audry Riles, PharmD ? ? ?Please be sure to bring in all your medications bottles to every appointment.  ? ?If you have any questions or concerns before your next appointment please send [place] a message through [place] or call our office at [phone].   ? ?TO LEAVE A MESSAGE FOR THE NURSE SELECT OPTION 2, PLEASE LEAVE A MESSAGE INCLUDING: ?YOUR NAME ?DATE OF BIRTH ?CALL BACK NUMBER ?REASON FOR CALL**this is important as we prioritize the call backs ? ?YOU WILL RECEIVE A CALL BACK THE SAME DAY AS LONG AS YOU CALL BEFORE 4:00 PM ? ?

## 2022-04-19 DIAGNOSIS — N183 Chronic kidney disease, stage 3 unspecified: Secondary | ICD-10-CM | POA: Diagnosis not present

## 2022-04-19 DIAGNOSIS — T24031D Burn of unspecified degree of right lower leg, subsequent encounter: Secondary | ICD-10-CM | POA: Diagnosis not present

## 2022-04-19 DIAGNOSIS — M10362 Gout due to renal impairment, left knee: Secondary | ICD-10-CM | POA: Diagnosis not present

## 2022-04-19 DIAGNOSIS — I5033 Acute on chronic diastolic (congestive) heart failure: Secondary | ICD-10-CM | POA: Diagnosis not present

## 2022-04-19 DIAGNOSIS — M10342 Gout due to renal impairment, left hand: Secondary | ICD-10-CM | POA: Diagnosis not present

## 2022-04-19 DIAGNOSIS — T24032D Burn of unspecified degree of left lower leg, subsequent encounter: Secondary | ICD-10-CM | POA: Diagnosis not present

## 2022-04-21 ENCOUNTER — Encounter (HOSPITAL_COMMUNITY): Payer: Self-pay

## 2022-04-21 ENCOUNTER — Emergency Department (HOSPITAL_COMMUNITY): Payer: Medicare Other

## 2022-04-21 ENCOUNTER — Emergency Department (HOSPITAL_COMMUNITY)
Admission: EM | Admit: 2022-04-21 | Discharge: 2022-05-15 | DRG: 683 | Disposition: E | Payer: Medicare Other | Attending: Emergency Medicine | Admitting: Emergency Medicine

## 2022-04-21 DIAGNOSIS — Z7901 Long term (current) use of anticoagulants: Secondary | ICD-10-CM | POA: Insufficient documentation

## 2022-04-21 DIAGNOSIS — R079 Chest pain, unspecified: Secondary | ICD-10-CM | POA: Diagnosis not present

## 2022-04-21 DIAGNOSIS — N179 Acute kidney failure, unspecified: Secondary | ICD-10-CM | POA: Insufficient documentation

## 2022-04-21 DIAGNOSIS — R197 Diarrhea, unspecified: Secondary | ICD-10-CM | POA: Insufficient documentation

## 2022-04-21 DIAGNOSIS — R5383 Other fatigue: Secondary | ICD-10-CM | POA: Insufficient documentation

## 2022-04-21 DIAGNOSIS — R0789 Other chest pain: Secondary | ICD-10-CM | POA: Diagnosis not present

## 2022-04-21 DIAGNOSIS — I959 Hypotension, unspecified: Secondary | ICD-10-CM | POA: Diagnosis not present

## 2022-04-21 DIAGNOSIS — R1084 Generalized abdominal pain: Secondary | ICD-10-CM | POA: Insufficient documentation

## 2022-04-21 DIAGNOSIS — I469 Cardiac arrest, cause unspecified: Secondary | ICD-10-CM | POA: Insufficient documentation

## 2022-04-21 DIAGNOSIS — R2243 Localized swelling, mass and lump, lower limb, bilateral: Secondary | ICD-10-CM | POA: Insufficient documentation

## 2022-04-21 DIAGNOSIS — R001 Bradycardia, unspecified: Secondary | ICD-10-CM

## 2022-04-21 DIAGNOSIS — I509 Heart failure, unspecified: Secondary | ICD-10-CM | POA: Insufficient documentation

## 2022-04-21 DIAGNOSIS — R790 Abnormal level of blood mineral: Secondary | ICD-10-CM | POA: Insufficient documentation

## 2022-04-21 DIAGNOSIS — Z20822 Contact with and (suspected) exposure to covid-19: Secondary | ICD-10-CM | POA: Insufficient documentation

## 2022-04-21 DIAGNOSIS — R531 Weakness: Secondary | ICD-10-CM | POA: Diagnosis not present

## 2022-04-21 DIAGNOSIS — N189 Chronic kidney disease, unspecified: Secondary | ICD-10-CM | POA: Diagnosis not present

## 2022-04-21 DIAGNOSIS — R0602 Shortness of breath: Secondary | ICD-10-CM | POA: Diagnosis not present

## 2022-04-21 DIAGNOSIS — R110 Nausea: Secondary | ICD-10-CM | POA: Insufficient documentation

## 2022-04-21 LAB — TSH: TSH: 8.512 u[IU]/mL — ABNORMAL HIGH (ref 0.350–4.500)

## 2022-04-21 LAB — CBC WITH DIFFERENTIAL/PLATELET
Abs Immature Granulocytes: 0.02 10*3/uL (ref 0.00–0.07)
Basophils Absolute: 0 10*3/uL (ref 0.0–0.1)
Basophils Relative: 0 %
Eosinophils Absolute: 0.2 10*3/uL (ref 0.0–0.5)
Eosinophils Relative: 2 %
HCT: 27.9 % — ABNORMAL LOW (ref 36.0–46.0)
Hemoglobin: 8.7 g/dL — ABNORMAL LOW (ref 12.0–15.0)
Immature Granulocytes: 0 %
Lymphocytes Relative: 11 %
Lymphs Abs: 0.9 10*3/uL (ref 0.7–4.0)
MCH: 31.4 pg (ref 26.0–34.0)
MCHC: 31.2 g/dL (ref 30.0–36.0)
MCV: 100.7 fL — ABNORMAL HIGH (ref 80.0–100.0)
Monocytes Absolute: 0.5 10*3/uL (ref 0.1–1.0)
Monocytes Relative: 7 %
Neutro Abs: 6.3 10*3/uL (ref 1.7–7.7)
Neutrophils Relative %: 80 %
Platelets: 243 10*3/uL (ref 150–400)
RBC: 2.77 MIL/uL — ABNORMAL LOW (ref 3.87–5.11)
RDW: 13.6 % (ref 11.5–15.5)
WBC: 7.9 10*3/uL (ref 4.0–10.5)
nRBC: 0 % (ref 0.0–0.2)

## 2022-04-21 LAB — COMPREHENSIVE METABOLIC PANEL
ALT: 21 U/L (ref 0–44)
AST: 27 U/L (ref 15–41)
Albumin: 3 g/dL — ABNORMAL LOW (ref 3.5–5.0)
Alkaline Phosphatase: 104 U/L (ref 38–126)
Anion gap: 15 (ref 5–15)
BUN: 73 mg/dL — ABNORMAL HIGH (ref 8–23)
CO2: 23 mmol/L (ref 22–32)
Calcium: 9.1 mg/dL (ref 8.9–10.3)
Chloride: 97 mmol/L — ABNORMAL LOW (ref 98–111)
Creatinine, Ser: 2.85 mg/dL — ABNORMAL HIGH (ref 0.44–1.00)
GFR, Estimated: 17 mL/min — ABNORMAL LOW (ref >60)
Glucose, Bld: 99 mg/dL (ref 70–99)
Potassium: 5 mmol/L (ref 3.5–5.1)
Sodium: 135 mmol/L (ref 135–145)
Total Bilirubin: 1.3 mg/dL — ABNORMAL HIGH (ref 0.3–1.2)
Total Protein: 6.2 g/dL — ABNORMAL LOW (ref 6.5–8.1)

## 2022-04-21 LAB — LACTIC ACID, PLASMA
Lactic Acid, Venous: 1.1 mmol/L (ref 0.5–1.9)
Lactic Acid, Venous: 3 mmol/L (ref 0.5–1.9)

## 2022-04-21 LAB — TROPONIN I (HIGH SENSITIVITY)
Troponin I (High Sensitivity): 71 ng/L — ABNORMAL HIGH (ref <18)
Troponin I (High Sensitivity): 78 ng/L — ABNORMAL HIGH (ref <18)

## 2022-04-21 LAB — RESP PANEL BY RT-PCR (FLU A&B, COVID) ARPGX2
Influenza A by PCR: NEGATIVE
Influenza B by PCR: NEGATIVE
SARS Coronavirus 2 by RT PCR: NEGATIVE

## 2022-04-21 LAB — LIPASE, BLOOD: Lipase: 27 U/L (ref 11–51)

## 2022-04-21 LAB — BRAIN NATRIURETIC PEPTIDE: B Natriuretic Peptide: 1043.5 pg/mL — ABNORMAL HIGH (ref 0.0–100.0)

## 2022-04-21 IMAGING — DX DG CHEST 1V PORT
1 series · 1 of 1 positions shown · non-contrast
Comparison: Previous studies including the examination done on
[DATE]

CLINICAL DATA: Chest pain, shortness of breath

EXAM:
PORTABLE CHEST 1 VIEW

[chest ap]
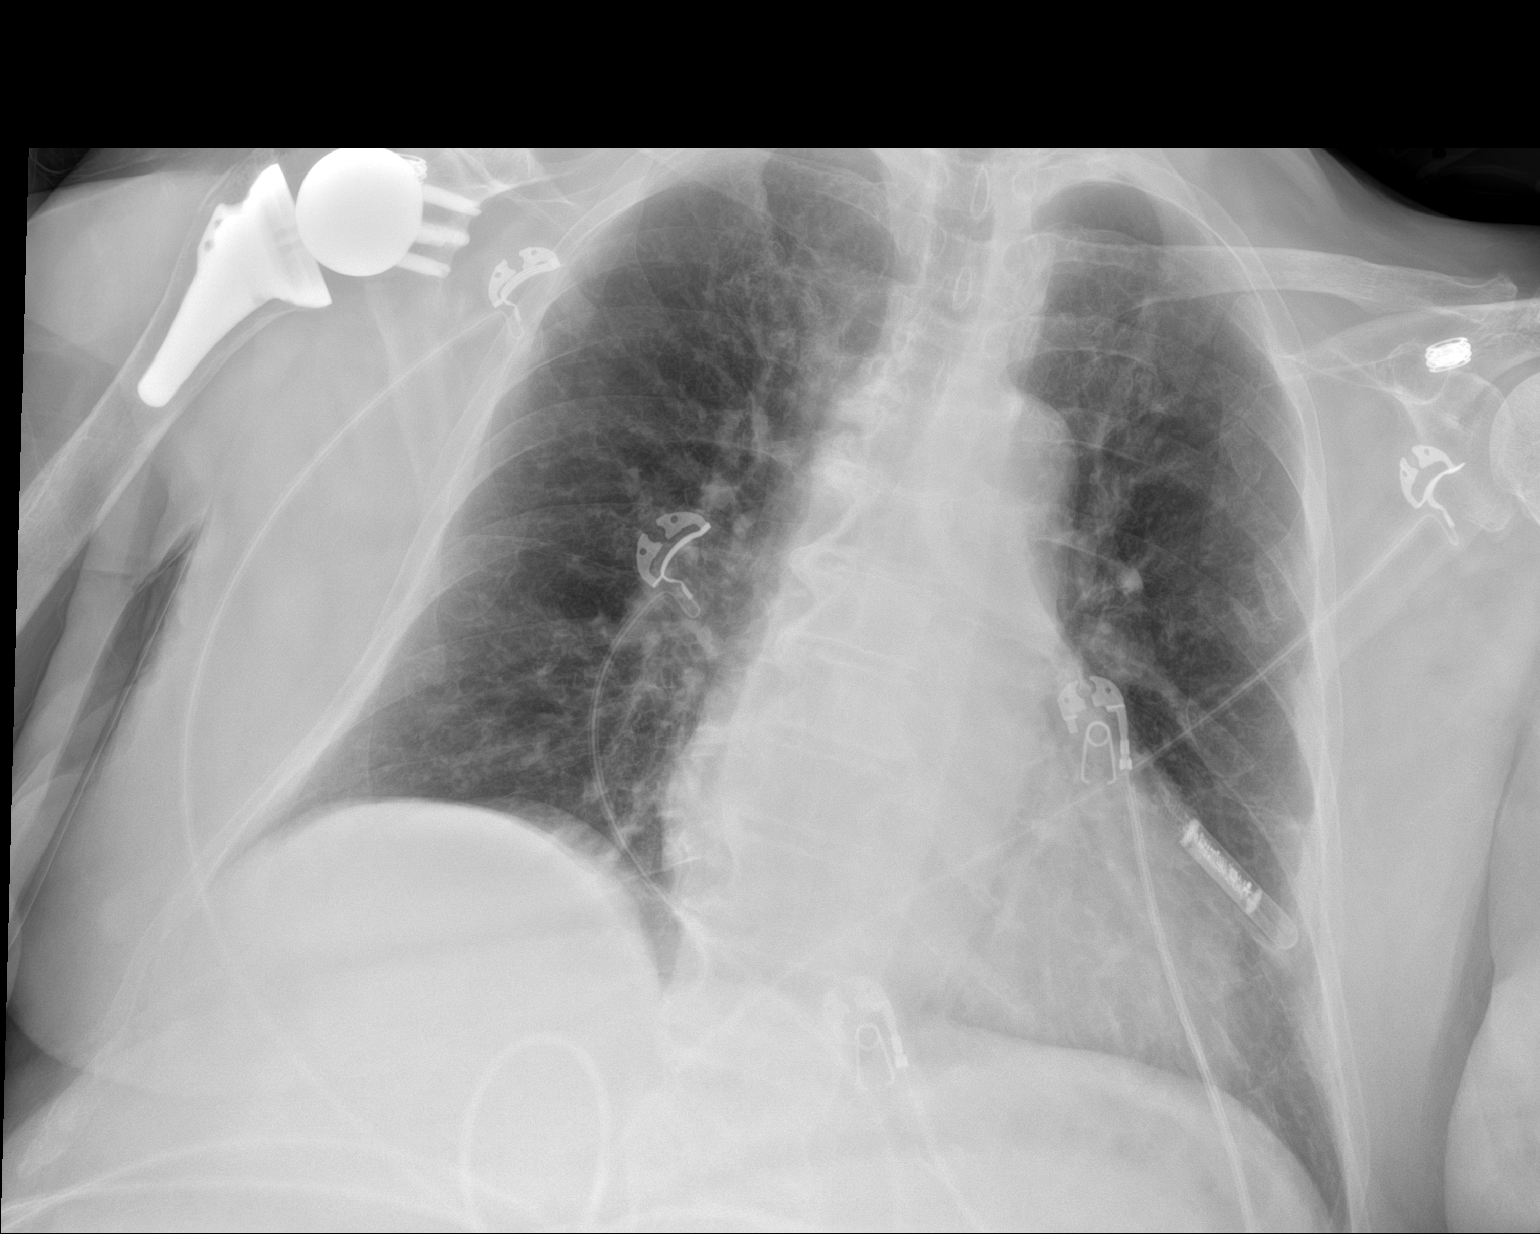

[1 of 1 positions shown; findings below may reference images not displayed]

FINDINGS: Transverse diameter of heart is increased. There are no signs of
pulmonary edema or new focal infiltrates. There is no significant
pleural effusion or pneumothorax. There is previous right shoulder
arthroplasty. Deformity is seen in multiple left ribs suggesting old
fractures.
IMPRESSION: Cardiomegaly. There are no signs of pulmonary edema or new focal
infiltrates.

## 2022-04-21 MED ORDER — ROPINIROLE HCL 1 MG PO TABS: 1.0000 mg | ORAL_TABLET | Freq: Once | ORAL | Status: DC

## 2022-04-21 MED ORDER — MIDODRINE HCL 5 MG PO TABS
10.0000 mg | ORAL_TABLET | Freq: Three times a day (TID) | ORAL | Status: DC
Start: 2022-04-21 — End: 2022-04-21

## 2022-04-21 MED ORDER — NALOXONE HCL 0.4 MG/ML IJ SOLN
INTRAMUSCULAR | Status: AC
  Filled 2022-04-21: qty 1

## 2022-04-21 MED ORDER — ATROPINE SULFATE 1 MG/ML IV SOLN
INTRAVENOUS | Status: AC | PRN
Start: 2022-04-21 — End: 2022-04-21
  Administered 2022-04-21: .5 mg via INTRAVENOUS

## 2022-04-21 MED ORDER — DOPAMINE-DEXTROSE 3.2-5 MG/ML-% IV SOLN
0.0000 ug/kg/min | INTRAVENOUS | Status: DC
  Administered 2022-04-21: 5 ug/kg/min via INTRAVENOUS

## 2022-04-21 MED ORDER — EPINEPHRINE 1 MG/10ML IJ SOSY
PREFILLED_SYRINGE | INTRAMUSCULAR | Status: AC | PRN
  Administered 2022-04-21 (×2): 1 mg via INTRAVENOUS

## 2022-04-21 MED ORDER — NALOXONE HCL 2 MG/2ML IJ SOSY
PREFILLED_SYRINGE | INTRAMUSCULAR | Status: AC
  Filled 2022-04-21: qty 2

## 2022-04-21 MED ORDER — SODIUM CHLORIDE 0.9 % IV BOLUS
500.0000 mL | Freq: Once | INTRAVENOUS | Status: AC
  Administered 2022-04-21: 500 mL via INTRAVENOUS

## 2022-04-21 MED ORDER — ATROPINE SULFATE 1 MG/10ML IJ SOSY
PREFILLED_SYRINGE | INTRAMUSCULAR | Status: AC
  Filled 2022-04-21: qty 20

## 2022-04-21 MED ORDER — EPINEPHRINE 1 MG/10ML IJ SOSY
PREFILLED_SYRINGE | INTRAMUSCULAR | Status: AC | PRN
Start: 2022-04-21 — End: 2022-04-21
  Administered 2022-04-21 (×2): 1 mg via INTRAVENOUS

## 2022-04-21 MED ORDER — NOREPINEPHRINE 4 MG/250ML-% IV SOLN
INTRAVENOUS | Status: AC
  Filled 2022-04-21: qty 250

## 2022-04-21 MED ORDER — SODIUM BICARBONATE 8.4 % IV SOLN
INTRAVENOUS | Status: AC | PRN
  Administered 2022-04-21 (×2): 50 meq via INTRAVENOUS

## 2022-04-21 MED ORDER — ATROPINE SULFATE 1 MG/10ML IJ SOSY
PREFILLED_SYRINGE | INTRAMUSCULAR | Status: AC
  Administered 2022-04-21: 0.5 mg
  Filled 2022-04-21: qty 10

## 2022-04-21 MED ORDER — ONDANSETRON HCL 4 MG/2ML IJ SOLN
4.0000 mg | Freq: Once | INTRAMUSCULAR | Status: AC
  Administered 2022-04-21: 4 mg via INTRAVENOUS
  Filled 2022-04-21: qty 2

## 2022-04-21 MED ORDER — HYDROCODONE-ACETAMINOPHEN 5-325 MG PO TABS
2.0000 | ORAL_TABLET | Freq: Once | ORAL | Status: AC
Start: 2022-04-21 — End: 2022-04-21
  Administered 2022-04-21: 2 via ORAL
  Filled 2022-04-21: qty 2

## 2022-04-21 MED ORDER — SODIUM CHLORIDE 0.9 % IV BOLUS: 500.0000 mL | Freq: Once | INTRAVENOUS | Status: DC

## 2022-04-21 MED ORDER — DOPAMINE-DEXTROSE 3.2-5 MG/ML-% IV SOLN
INTRAVENOUS | Status: AC
  Administered 2022-04-21: 15 ug/kg/min via INTRAVENOUS
  Filled 2022-04-21: qty 250

## 2022-04-21 MED ORDER — NOREPINEPHRINE 4 MG/250ML-% IV SOLN
INTRAVENOUS | Status: AC | PRN
  Administered 2022-04-21: 15 ug/min via INTRAVENOUS

## 2022-04-22 ENCOUNTER — Telehealth: Payer: Self-pay | Admitting: Neurology

## 2022-04-22 NOTE — Telephone Encounter (Signed)
Our condolences go out to the family and grateful they made [place] aware. We will send a sympathy card to the family.  ?

## 2022-04-22 NOTE — Telephone Encounter (Signed)
Notified by family pt passed away yesterday  ?

## 2022-04-25 ENCOUNTER — Encounter: Payer: Medicare Other | Admitting: Physical Medicine & Rehabilitation

## 2022-05-09 ENCOUNTER — Encounter (HOSPITAL_COMMUNITY): Payer: Medicare Other

## 2022-05-15 DIAGNOSIS — 419620001 Death: Secondary | SNOMED CT | POA: Insufficient documentation

## 2022-05-15 NOTE — Code Documentation (Signed)
IO access established by CCM ?

## 2022-05-15 NOTE — ED Provider Notes (Signed)
Called to bedside by nursing staff ? ?Patient was noted to be bradycardic into the mid 30s lower 40s. ? ?Patient without complaint of chest pain or shortness of breath.  Patient is mentating well. ? ?Patient without apparent prior history of significant bradycardia. ? ?EKG suggest junctional rhythm. ? ?0.5 atropine administered with slight improvement in heart rate from the lower 40s to the 50s. ? ?Out of precaution, pacing pads placed. ? ?Cardiology made aware of case.  Event discussed with Dr. Tamala Julian. ?  ?Valarie Merino, MD ?05/10/2022 1255 ? ?

## 2022-05-15 NOTE — Progress Notes (Signed)
Initially consulted to admit [name] 76 year old lady with past medical history significant for HFpEF (EF 55-60%, G2DD by TTE 02/04/2022), pulmonary hypertension, history of cryptogenic stroke, CKD stage IIIb, orthostatic hypotension, HLD, anemia of chronic disease, history of gastric bypass, RLS, depression, and OSA on CPAP who had just recently been admitted into the hospital with concern for pulmonary embolus started on Eliquis last month.  She presented with complaints of shortness of breath, chest pain, and weakness.   noted to be intermittently bradycardic into the 35s.  She had initially been given atropine 0.5 mg with temporary improvement of heart rates into the 50s.  Cardiology had been for the consulted and pacing pads were placed.  Critical care consulted and orders placed to start patient on dopamine.  Heart rates noted again to be in the the 92E with systolic blood pressures 26S.  Pacing was started, but patient was noted to be less responsive and ED physician was called back into the room and preparing to intubate. ?

## 2022-05-15 NOTE — ED Notes (Signed)
Patient A&Ox4, denies chest pain at this time. Still complains of pain in legs due to her neuropathy. EDP aware, cardiology consulted due to heart rate. ?

## 2022-05-15 NOTE — Procedures (Signed)
Cardiopulmonary Resuscitation Note ? ?[name] Laser  ?[id number]  ?[birth year] ? ?Date:05/11/2022  ?Time:3:02 PM  ? ?Provider Performing:Wendell Fiebig  ? ?Procedure: Cardiopulmonary Resuscitation [phone]) ? ?Indication(s) ?Loss of Pulse ? ?Consent ?N/A ? ?Anesthesia ?N/A ? ? ?Time Out ?N/A ? ? ?Sterile Technique ?Hand hygiene, gloves ? ? ?Procedure Description ?Called to patient's room for CODE BLUE. Initial rhythm was PEA/Asystole. Patient received high quality chest compressions for 12 minutes with defibrillation or cardioversion when appropriate. Epinephrine was administered every 3 minutes as directed by time Therapist, nutritional. Additional pharmacologic interventions included atropine and sodium bicarbonate. Additional procedural interventions include intra-osseus line.  Return of spontaneous circulation was not achieved. ? ?Family at bedside. ? ? ?Complications/Tolerance ?N/A ? ? ?EBL ?N/A ? ? ?Specimen(s) ?N/A ? ? ?

## 2022-05-15 NOTE — ED Provider Notes (Signed)
Addendum note ? ?I was called to the bedside.   ? ?Patient had been already evaluated by hospitalist service for possible admission. ? ?Patient with recurrent episode of bradycardia. ? ?Staff noted that she had become unresponsive. ? ?When I entered the room the patient already had pacing pads on with likely capture.  Patient was unresponsive.  Dopamine drip already initiated. ? ?Ventilation assistance initiated.  Preparations for intubation began.  At this point it was clear that pacing of patient was without successful capture. ? ?CPR initiated. ? ?Multiple rounds of epi and 1 attempted defibrillation did not result in return of spontaneous circulation. ? ?Patient's husband was made aware of the situation.  He endorsed the fact that the patient would never want aggressive measures. ? ?Decision was made to halt CPR and halt resuscitation attempts at 1352. ? ?Patient's PCP Dr. [name] [phone]) made aware of case and outcome of case.  Dr. Theda Sers office is happy to receive Death Certificate of the patient. ?  ?Janice Merino, MD ?04-May-2022 1449 ? ?

## 2022-05-15 NOTE — ED Provider Notes (Addendum)
?[facility] ?Provider Note ? ? ?CSN: [id number] ?Arrival date & time: 2022/05/15  3557 ? ?  ? ?History ?PMH: HLD, carotid stenosis, OSA, GERD, hx gastric bypass, CHF Recent PE on Eliquis, hx of CVA, orthostatic hypotension ?Chief Complaint  ?Patient presents with  ? SOB  ? Weakness  ? Chest Pain  ? ? ?[name] [name] is a 76 y.o. female. ?Presents the ED with a chief complaint of chest pain, shortness of breath, and generalized weakness.   ?Admission on 4/16 for Bilateral Pulmonary Embolism with CHF. Required Lasix drip and Heparin. ECHO from 2/21 with normal EF.  She is now on Torsemide daily. On Eliquis for PE. She went to rehab following this admission with some improvement. Discharged on 5/3. ?She was seen by her cardiologist on May 5 and was doing well at that point.  She states that she is ambulatory with a walker and was most recently getting around just last night. ? ?She states that she started having diarrhea about 4 days ago. she has been having about 2 episodes a day.  It has been watery and nonbloody.  She has had associated nausea with no vomiting.  She also has associated mild generalized abdominal pain.   ? ?She states that last night around 8 PM she started experiencing right-sided chest pain.  Pain is intermittent.  Taking deep breaths makes it worse.  She used Nitropaste which helped improve the symptoms.  Associated shortness of breath.  She also started developing generalized weakness last night.  She says today, she is barely able to lift her arms or legs.  She has not been able to get out of bed.  She thinks the shortness of breath has gotten worse.  She also endorses bilateral lower extremity swelling which she feels like is worse than normal.  She did state that she missed her Eliquis dosing yesterday and today.  She has not missed any other dosing.  She does think that her chest pain feels similar to when she had her initial pulmonary embolism, but she has  had similar intermittent chest pain since the PE occur periodically.  She does not have any unilateral leg swelling. ? ? ?Weakness ?Associated symptoms: abdominal pain, chest pain, diarrhea, nausea and shortness of breath   ?Associated symptoms: no cough, no dizziness, no fever, no headaches and no vomiting   ?Chest Pain ?Associated symptoms: abdominal pain, fatigue, nausea, shortness of breath and weakness   ?Associated symptoms: no cough, no dizziness, no fever, no headache and no vomiting   ? ?HPI: A 76 year old patient with a history of CVA and hypercholesterolemia presents for evaluation of chest pain. Initial onset of pain was less than one hour ago. The patient's chest pain is well-localized and is not worse with exertion. The patient's chest pain is not middle- or left-sided, is not described as heaviness/pressure/tightness, is not sharp and does not radiate to the arms/jaw/neck. The patient does not complain of nausea and denies diaphoresis. The patient has no history of peripheral artery disease, has not smoked in the past 90 days, denies any history of treated diabetes, has no relevant family history of coronary artery disease (first degree relative at less than age 18), is not hypertensive and does not have an elevated BMI (>=30).  ? ?Home Medications ?Prior to Admission medications   ?Medication Sig Start Date End Date Taking? Authorizing Provider  ?acetaminophen (TYLENOL) 325 MG tablet Take 2 tablets (650 mg total) by mouth every 4 (four)  hours as needed for headache or mild pain. 02/27/22   Love, Ivan Anchors, PA-C  ?apixaban (ELIQUIS) 5 MG TABS tablet Take 1 tablet (5 mg total) by mouth 2 (two) times daily. 04/15/22   Angiulli, Lavon Paganini, PA-C  ?Baclofen 5 MG TABS Take 5 mg by mouth 2 (two) times daily. 04/15/22   Angiulli, Lavon Paganini, PA-C  ?calcium carbonate (OS-CAL) 1250 (500 Ca) MG chewable tablet Chew 1 tablet (1,250 mg total) by mouth daily. Not sure of dosage 04/15/22   Angiulli, Lavon Paganini, PA-C  ?cephALEXin  (KEFLEX) 500 MG capsule Take 1 capsule (500 mg total) by mouth every 12 (twelve) hours. 04/15/22   Angiulli, Lavon Paganini, PA-C  ?DULoxetine (CYMBALTA) 60 MG capsule Take 1 capsule (60 mg total) by mouth daily. 04/15/22   Angiulli, Lavon Paganini, PA-C  ?esomeprazole (NEXIUM) 40 MG capsule Take 1 capsule (40 mg total) by mouth 2 (two) times daily before a meal. 04/15/22   Angiulli, Lavon Paganini, PA-C  ?gabapentin (NEURONTIN) 100 MG capsule Take 1 capsule (100 mg total) by mouth 3 (three) times daily. 04/15/22   Angiulli, Lavon Paganini, PA-C  ?HYDROcodone-acetaminophen (NORCO) 7.5-325 MG tablet Take 2 tablets by mouth every 6 (six) hours as needed for severe pain. 04/15/22   Angiulli, Lavon Paganini, PA-C  ?levETIRAcetam (KEPPRA) 250 MG tablet Take 1 tablet (250 mg total) by mouth 2 (two) times daily. 04/15/22   Angiulli, Lavon Paganini, PA-C  ?loratadine (CLARITIN) 10 MG tablet Take 1 tablet (10 mg total) by mouth daily. 04/15/22   Angiulli, Lavon Paganini, PA-C  ?meclizine (ANTIVERT) 12.5 MG tablet Take 1 tablet (12.5 mg total) by mouth 3 (three) times daily. 04/15/22   Angiulli, Lavon Paganini, PA-C  ?melatonin 5 MG TABS Take 1 tablet (5 mg total) by mouth at bedtime. 04/15/22   Angiulli, Lavon Paganini, PA-C  ?midodrine (PROAMATINE) 10 MG tablet Take 1 tablet (10 mg total) by mouth 3 (three) times daily. 04/15/22   Angiulli, Lavon Paganini, PA-C  ?nitroGLYCERIN (NITROSTAT) 0.4 MG SL tablet Place 1 tablet (0.4 mg total) under the tongue every 5 (five) minutes as needed for chest pain. 04/15/22   Angiulli, Lavon Paganini, PA-C  ?polyethylene glycol (MIRALAX / GLYCOLAX) 17 g packet Take 17 g by mouth 2 (two) times daily. 04/15/22   Angiulli, Lavon Paganini, PA-C  ?PROAIR HFA 108 (90 Base) MCG/ACT inhaler Inhale 1 puff into the lungs every 6 (six) hours as needed for shortness of breath. 04/15/22   Angiulli, Lavon Paganini, PA-C  ?rOPINIRole (REQUIP) 1 MG tablet Take 1 mg by mouth daily in the afternoon. 2 tablets Qhs    [provider]  ?senna-docusate (SENOKOT-S) 8.6-50 MG tablet Take 1 tablet by mouth  at bedtime as needed for mild constipation. 04/04/22   Pokhrel, Corrie Mckusick, MD  ?torsemide (DEMADEX) 20 MG tablet Take 1 tablet (20 mg total) by mouth daily. 04/15/22   Angiulli, Lavon Paganini, PA-C  ?Vitamin D, Ergocalciferol, (DRISDOL) 1.25 MG (50000 UNIT) CAPS capsule Take 1 capsule (50,000 Units total) by mouth every 7 (seven) days. 04/15/22   Angiulli, Lavon Paganini, PA-C  ?   ? ?Allergies    ?Atorvastatin, Crestor [rosuvastatin], Ezetimibe, and Amitriptyline   ? ?Review of Systems   ?Review of Systems  ?Constitutional:  Positive for fatigue. Negative for chills and fever.  ?Eyes:  Negative for visual disturbance.  ?Respiratory:  Positive for shortness of breath. Negative for cough.   ?Cardiovascular:  Positive for chest pain and leg swelling.  ?Gastrointestinal:  Positive for abdominal  pain, diarrhea and nausea. Negative for abdominal distention, blood in stool and vomiting.  ?Neurological:  Positive for weakness. Negative for dizziness, syncope, facial asymmetry and headaches.  ?All other systems reviewed and are negative. ? ?Physical Exam ?Updated Vital Signs ?BP (!) 77/32   Pulse (!) 42   Temp 98.1 ?F (36.7 ?C) (Oral)   Resp (!) 23   Ht '5\' 7"'$  (1.702 m)   Wt 85.6 kg   SpO2 91%   BMI 29.56 kg/m?  ?Physical Exam ?Vitals and nursing note reviewed.  ?Constitutional:   ?   General: She is not in acute distress. ?   Appearance: Normal appearance. She is obese. She is ill-appearing. She is not toxic-appearing or diaphoretic.  ?   Comments: Appears very fatigued. Barely able to keep eyes open  ?HENT:  ?   Head: Normocephalic and atraumatic.  ?   Nose: No nasal deformity.  ?   Mouth/Throat:  ?   Lips: Pink. No lesions.  ?   Mouth: Mucous membranes are dry. No injury, lacerations, oral lesions or angioedema.  ?   Pharynx: Oropharynx is clear. Uvula midline. No pharyngeal swelling, oropharyngeal exudate, posterior oropharyngeal erythema or uvula swelling.  ?Eyes:  ?   General: Gaze aligned appropriately. No scleral icterus.    ?    Right eye: No discharge.     ?   Left eye: No discharge.  ?   Extraocular Movements: Extraocular movements intact.  ?   Conjunctiva/sclera: Conjunctivae normal.  ?   Right eye: Right conjunctiva is not injec

## 2022-05-15 NOTE — Procedures (Signed)
Intraosseous Needle Insertion Procedure Note  ? ? ?Date:2022/05/09  ?Time:3:01 PM  ? ?Provider Performing:Lateshia Schmoker  ? ?Procedure: Insertion Intraosseous [phone]) ? ?Indication(s) ?Medication administration ? ?Consent ?Unable to obtain consent due to emergent nature of procedure. ? ? ?Timeout ?Verified patient identification, verified procedure, site/side was marked, verified correct patient position, special equipment/implants available, medications/allergies/relevant history reviewed, required imaging and test results available. ? ?Procedure Description ?Area of needle insertion was cleaned with chlorhexidine. Intraosseous needle was placed into the left tibia. Bone marrow was aspirated and site easily flushed. The needle was secured in place and dressing applied. ? ?Complications/Tolerance ?None; patient tolerated the procedure well. ? ?EBL ?Minimal ? ?

## 2022-05-15 NOTE — ED Notes (Signed)
EDP at bedside  

## 2022-05-15 NOTE — ED Notes (Signed)
Called to patient bedside by husband patient had a decrease LOC color was ashen, Dr. Francia Greaves at bedside o2 applied. Color returned patient becoming more alert.  ?

## 2022-05-15 NOTE — ED Triage Notes (Signed)
Pt BIB [facility] from home d/t SOB, CP & generalized weakness the last couple of weeks that has been much worse the last few days. Pt reports she was recently diagnosed with blood clots & started on Eliquis but did not take her dose yesterday. She also reports waiting on an appointment to see a hematologists d/t being told she has CA. 106/60, 95% on RA, CBG 102, A/Ox4, 12L unremarkable.  ?

## 2022-05-15 DEATH — deceased

## 2022-06-04 ENCOUNTER — Encounter (INDEPENDENT_AMBULATORY_CARE_PROVIDER_SITE_OTHER): Payer: Medicare Other

## 2022-06-04 ENCOUNTER — Encounter (INDEPENDENT_AMBULATORY_CARE_PROVIDER_SITE_OTHER): Payer: Medicare Other | Admitting: Nurse Practitioner

## 2022-06-11 ENCOUNTER — Ambulatory Visit: Payer: Medicare Other | Admitting: Neurology

## 2022-07-25 ENCOUNTER — Encounter (HOSPITAL_COMMUNITY): Payer: Medicare Other | Admitting: Cardiology

## 2023-01-07 ENCOUNTER — Other Ambulatory Visit (HOSPITAL_COMMUNITY): Payer: Self-pay
